# Patient Record
Sex: Male | Born: 1935 | Race: White | Hispanic: No | Marital: Married | Smoking: Former smoker
Health system: Southern US, Community
[De-identification: ages and names within clinical notes are randomized; demographics above are authoritative.]

## PROBLEM LIST (undated history)

## (undated) DIAGNOSIS — Z95828 Presence of other vascular implants and grafts: Secondary | ICD-10-CM

## (undated) DIAGNOSIS — D039 Melanoma in situ, unspecified: Secondary | ICD-10-CM

## (undated) DIAGNOSIS — E119 Type 2 diabetes mellitus without complications: Secondary | ICD-10-CM

## (undated) DIAGNOSIS — B029 Zoster without complications: Secondary | ICD-10-CM

## (undated) DIAGNOSIS — E669 Obesity, unspecified: Secondary | ICD-10-CM

## (undated) DIAGNOSIS — L309 Dermatitis, unspecified: Secondary | ICD-10-CM

## (undated) DIAGNOSIS — C9110 Chronic lymphocytic leukemia of B-cell type not having achieved remission: Secondary | ICD-10-CM

## (undated) DIAGNOSIS — D099 Carcinoma in situ, unspecified: Secondary | ICD-10-CM

## (undated) DIAGNOSIS — Z8701 Personal history of pneumonia (recurrent): Secondary | ICD-10-CM

## (undated) DIAGNOSIS — IMO0002 Reserved for concepts with insufficient information to code with codable children: Secondary | ICD-10-CM

## (undated) DIAGNOSIS — J189 Pneumonia, unspecified organism: Secondary | ICD-10-CM

## (undated) DIAGNOSIS — D649 Anemia, unspecified: Secondary | ICD-10-CM

## (undated) DIAGNOSIS — N189 Chronic kidney disease, unspecified: Secondary | ICD-10-CM

## (undated) DIAGNOSIS — I10 Essential (primary) hypertension: Secondary | ICD-10-CM

## (undated) DIAGNOSIS — E538 Deficiency of other specified B group vitamins: Secondary | ICD-10-CM

## (undated) DIAGNOSIS — Z950 Presence of cardiac pacemaker: Secondary | ICD-10-CM

## (undated) DIAGNOSIS — C9590 Leukemia, unspecified not having achieved remission: Secondary | ICD-10-CM

## (undated) HISTORY — DX: Presence of other vascular implants and grafts: Z95.828

## (undated) HISTORY — DX: Dermatitis, unspecified: L30.9

## (undated) HISTORY — DX: Zoster without complications: B02.9

## (undated) HISTORY — DX: Essential (primary) hypertension: I10

## (undated) HISTORY — PX: EYE SURGERY: SHX253

## (undated) HISTORY — DX: Melanoma in situ, unspecified: D03.9

## (undated) HISTORY — DX: Personal history of pneumonia (recurrent): Z87.01

## (undated) HISTORY — DX: Deficiency of other specified B group vitamins: E53.8

## (undated) HISTORY — DX: Obesity, unspecified: E66.9

## (undated) HISTORY — DX: Reserved for concepts with insufficient information to code with codable children: IMO0002

## (undated) HISTORY — DX: Chronic lymphocytic leukemia of B-cell type not having achieved remission: C91.10

## (undated) HISTORY — DX: Anemia, unspecified: D64.9

## (undated) HISTORY — PX: TONSILLECTOMY: SUR1361

## (undated) HISTORY — PX: OTHER SURGICAL HISTORY: SHX169

## (undated) HISTORY — DX: Type 2 diabetes mellitus without complications: E11.9

---

## 1999-05-15 HISTORY — PX: HERNIA REPAIR: SHX51

## 2005-02-10 ENCOUNTER — Emergency Department (HOSPITAL_COMMUNITY): Admission: EM | Admit: 2005-02-10 | Discharge: 2005-02-11 | Payer: Self-pay | Admitting: Emergency Medicine

## 2005-03-13 ENCOUNTER — Encounter: Admission: RE | Admit: 2005-03-13 | Discharge: 2005-03-13 | Payer: Self-pay | Admitting: Oncology

## 2005-03-13 ENCOUNTER — Ambulatory Visit (HOSPITAL_COMMUNITY): Payer: Self-pay | Admitting: Oncology

## 2005-06-08 ENCOUNTER — Encounter: Admission: RE | Admit: 2005-06-08 | Discharge: 2005-06-08 | Payer: Self-pay | Admitting: Oncology

## 2005-06-08 ENCOUNTER — Encounter (HOSPITAL_COMMUNITY): Admission: RE | Admit: 2005-06-08 | Discharge: 2005-07-08 | Payer: Self-pay | Admitting: Oncology

## 2005-06-08 ENCOUNTER — Ambulatory Visit (HOSPITAL_COMMUNITY): Payer: Self-pay | Admitting: Oncology

## 2005-11-26 ENCOUNTER — Encounter (HOSPITAL_COMMUNITY): Admission: RE | Admit: 2005-11-26 | Discharge: 2005-12-26 | Payer: Self-pay | Admitting: Oncology

## 2005-11-26 ENCOUNTER — Encounter: Admission: RE | Admit: 2005-11-26 | Discharge: 2005-11-26 | Payer: Self-pay | Admitting: Oncology

## 2005-11-26 ENCOUNTER — Ambulatory Visit (HOSPITAL_COMMUNITY): Payer: Self-pay | Admitting: Oncology

## 2006-05-14 HISTORY — PX: ESOPHAGOGASTRODUODENOSCOPY: SHX1529

## 2006-05-14 HISTORY — PX: PORTACATH PLACEMENT: SHX2246

## 2006-05-30 ENCOUNTER — Encounter (HOSPITAL_COMMUNITY): Admission: RE | Admit: 2006-05-30 | Discharge: 2006-06-29 | Payer: Self-pay | Admitting: Oncology

## 2006-05-31 ENCOUNTER — Ambulatory Visit (HOSPITAL_COMMUNITY): Payer: Self-pay | Admitting: Oncology

## 2006-09-17 ENCOUNTER — Ambulatory Visit (HOSPITAL_COMMUNITY): Admission: RE | Admit: 2006-09-17 | Discharge: 2006-09-17 | Payer: Self-pay | Admitting: Orthopaedic Surgery

## 2006-10-12 ENCOUNTER — Inpatient Hospital Stay (HOSPITAL_COMMUNITY): Admission: EM | Admit: 2006-10-12 | Discharge: 2006-10-15 | Payer: Self-pay | Admitting: Emergency Medicine

## 2006-10-13 ENCOUNTER — Ambulatory Visit: Payer: Self-pay | Admitting: Gastroenterology

## 2006-10-14 ENCOUNTER — Encounter: Payer: Self-pay | Admitting: Gastroenterology

## 2006-10-14 ENCOUNTER — Ambulatory Visit: Payer: Self-pay | Admitting: Gastroenterology

## 2007-01-31 ENCOUNTER — Ambulatory Visit (HOSPITAL_COMMUNITY): Payer: Self-pay | Admitting: Oncology

## 2007-01-31 ENCOUNTER — Encounter (HOSPITAL_COMMUNITY): Admission: RE | Admit: 2007-01-31 | Discharge: 2007-02-11 | Payer: Self-pay | Admitting: Oncology

## 2008-02-10 ENCOUNTER — Ambulatory Visit (HOSPITAL_COMMUNITY): Payer: Self-pay | Admitting: Oncology

## 2008-02-10 ENCOUNTER — Encounter (HOSPITAL_COMMUNITY): Admission: RE | Admit: 2008-02-10 | Discharge: 2008-03-11 | Payer: Self-pay | Admitting: Oncology

## 2008-02-18 ENCOUNTER — Emergency Department (HOSPITAL_COMMUNITY): Admission: EM | Admit: 2008-02-18 | Discharge: 2008-02-18 | Payer: Self-pay | Admitting: Internal Medicine

## 2008-04-19 ENCOUNTER — Encounter (HOSPITAL_COMMUNITY): Admission: RE | Admit: 2008-04-19 | Discharge: 2008-05-12 | Payer: Self-pay | Admitting: Oncology

## 2008-04-19 ENCOUNTER — Ambulatory Visit (HOSPITAL_COMMUNITY): Payer: Self-pay | Admitting: Oncology

## 2008-06-09 ENCOUNTER — Encounter (HOSPITAL_COMMUNITY): Admission: RE | Admit: 2008-06-09 | Discharge: 2008-07-09 | Payer: Self-pay | Admitting: Oncology

## 2008-06-09 ENCOUNTER — Ambulatory Visit (HOSPITAL_COMMUNITY): Payer: Self-pay | Admitting: Oncology

## 2008-09-14 ENCOUNTER — Encounter (HOSPITAL_COMMUNITY): Admission: RE | Admit: 2008-09-14 | Discharge: 2008-10-14 | Payer: Self-pay | Admitting: Oncology

## 2008-09-14 ENCOUNTER — Ambulatory Visit (HOSPITAL_COMMUNITY): Payer: Self-pay | Admitting: Oncology

## 2008-10-29 ENCOUNTER — Ambulatory Visit (HOSPITAL_COMMUNITY): Payer: Self-pay | Admitting: Oncology

## 2008-10-29 ENCOUNTER — Encounter (HOSPITAL_COMMUNITY): Admission: RE | Admit: 2008-10-29 | Discharge: 2008-11-28 | Payer: Self-pay | Admitting: Oncology

## 2008-11-05 ENCOUNTER — Ambulatory Visit (HOSPITAL_COMMUNITY): Admission: RE | Admit: 2008-11-05 | Discharge: 2008-11-05 | Payer: Self-pay | Admitting: General Surgery

## 2008-11-29 ENCOUNTER — Encounter (HOSPITAL_COMMUNITY): Admission: RE | Admit: 2008-11-29 | Discharge: 2008-12-29 | Payer: Self-pay | Admitting: Oncology

## 2008-12-20 ENCOUNTER — Ambulatory Visit (HOSPITAL_COMMUNITY): Payer: Self-pay | Admitting: Oncology

## 2009-01-04 ENCOUNTER — Encounter (HOSPITAL_COMMUNITY): Admission: RE | Admit: 2009-01-04 | Discharge: 2009-02-03 | Payer: Self-pay | Admitting: Oncology

## 2009-02-04 ENCOUNTER — Encounter (HOSPITAL_COMMUNITY): Admission: RE | Admit: 2009-02-04 | Discharge: 2009-02-11 | Payer: Self-pay | Admitting: Oncology

## 2009-02-08 ENCOUNTER — Ambulatory Visit (HOSPITAL_COMMUNITY): Payer: Self-pay | Admitting: Oncology

## 2009-02-16 ENCOUNTER — Encounter (HOSPITAL_COMMUNITY): Admission: RE | Admit: 2009-02-16 | Discharge: 2009-03-18 | Payer: Self-pay | Admitting: Oncology

## 2009-03-27 ENCOUNTER — Inpatient Hospital Stay (HOSPITAL_COMMUNITY): Admission: EM | Admit: 2009-03-27 | Discharge: 2009-03-29 | Payer: Self-pay | Admitting: Emergency Medicine

## 2009-04-01 ENCOUNTER — Encounter (HOSPITAL_COMMUNITY): Admission: RE | Admit: 2009-04-01 | Discharge: 2009-05-01 | Payer: Self-pay | Admitting: Oncology

## 2009-04-01 ENCOUNTER — Ambulatory Visit (HOSPITAL_COMMUNITY): Payer: Self-pay | Admitting: Oncology

## 2009-05-02 ENCOUNTER — Inpatient Hospital Stay (HOSPITAL_COMMUNITY): Admission: AD | Admit: 2009-05-02 | Discharge: 2009-05-04 | Payer: Self-pay | Admitting: Family Medicine

## 2009-05-13 ENCOUNTER — Encounter (HOSPITAL_COMMUNITY): Admission: RE | Admit: 2009-05-13 | Discharge: 2009-06-12 | Payer: Self-pay | Admitting: Oncology

## 2009-06-24 ENCOUNTER — Ambulatory Visit (HOSPITAL_COMMUNITY): Payer: Self-pay | Admitting: Oncology

## 2009-06-24 ENCOUNTER — Encounter (HOSPITAL_COMMUNITY): Admission: RE | Admit: 2009-06-24 | Discharge: 2009-07-24 | Payer: Self-pay | Admitting: Oncology

## 2009-07-22 ENCOUNTER — Encounter (HOSPITAL_COMMUNITY): Admission: RE | Admit: 2009-07-22 | Discharge: 2009-08-21 | Payer: Self-pay | Admitting: Oncology

## 2009-09-23 ENCOUNTER — Observation Stay (HOSPITAL_COMMUNITY): Admission: EM | Admit: 2009-09-23 | Discharge: 2009-09-25 | Payer: Self-pay | Admitting: Emergency Medicine

## 2009-10-14 ENCOUNTER — Ambulatory Visit (HOSPITAL_COMMUNITY): Payer: Self-pay | Admitting: Oncology

## 2009-12-05 ENCOUNTER — Ambulatory Visit (HOSPITAL_COMMUNITY): Payer: Self-pay | Admitting: Oncology

## 2010-01-17 ENCOUNTER — Encounter (HOSPITAL_COMMUNITY): Admission: RE | Admit: 2010-01-17 | Discharge: 2010-02-10 | Payer: Self-pay | Admitting: Oncology

## 2010-01-20 ENCOUNTER — Ambulatory Visit (HOSPITAL_COMMUNITY): Payer: Self-pay | Admitting: Oncology

## 2010-04-18 ENCOUNTER — Ambulatory Visit (HOSPITAL_COMMUNITY): Payer: Self-pay | Admitting: Oncology

## 2010-04-18 ENCOUNTER — Encounter (HOSPITAL_COMMUNITY)
Admission: RE | Admit: 2010-04-18 | Discharge: 2010-05-18 | Payer: Self-pay | Source: Home / Self Care | Attending: Oncology | Admitting: Oncology

## 2010-05-30 ENCOUNTER — Encounter (HOSPITAL_COMMUNITY)
Admission: RE | Admit: 2010-05-30 | Discharge: 2010-06-13 | Payer: Self-pay | Source: Home / Self Care | Attending: Oncology | Admitting: Oncology

## 2010-07-11 ENCOUNTER — Other Ambulatory Visit (HOSPITAL_COMMUNITY): Payer: Medicare Other

## 2010-07-11 ENCOUNTER — Encounter (HOSPITAL_COMMUNITY): Payer: Medicare Other | Attending: Oncology

## 2010-07-11 DIAGNOSIS — E119 Type 2 diabetes mellitus without complications: Secondary | ICD-10-CM | POA: Insufficient documentation

## 2010-07-11 DIAGNOSIS — Z9221 Personal history of antineoplastic chemotherapy: Secondary | ICD-10-CM | POA: Insufficient documentation

## 2010-07-11 DIAGNOSIS — C9110 Chronic lymphocytic leukemia of B-cell type not having achieved remission: Secondary | ICD-10-CM | POA: Insufficient documentation

## 2010-07-11 DIAGNOSIS — Z452 Encounter for adjustment and management of vascular access device: Secondary | ICD-10-CM

## 2010-07-24 ENCOUNTER — Ambulatory Visit (HOSPITAL_COMMUNITY): Payer: Medicare Other | Admitting: Oncology

## 2010-07-24 DIAGNOSIS — C9110 Chronic lymphocytic leukemia of B-cell type not having achieved remission: Secondary | ICD-10-CM

## 2010-07-24 LAB — DIFFERENTIAL
Basophils Absolute: 0 10*3/uL (ref 0.0–0.1)
Basophils Relative: 0 % (ref 0–1)
Eosinophils Absolute: 0.2 10*3/uL (ref 0.0–0.7)
Eosinophils Relative: 4 % (ref 0–5)
Lymphocytes Relative: 42 % (ref 12–46)
Lymphs Abs: 1.9 10*3/uL (ref 0.7–4.0)
Monocytes Absolute: 0.6 10*3/uL (ref 0.1–1.0)
Monocytes Relative: 12 % (ref 3–12)
Neutro Abs: 1.8 10*3/uL (ref 1.7–7.7)
Neutrophils Relative %: 41 % — ABNORMAL LOW (ref 43–77)

## 2010-07-24 LAB — COMPREHENSIVE METABOLIC PANEL
ALT: 28 U/L (ref 0–53)
AST: 23 U/L (ref 0–37)
Albumin: 4.2 g/dL (ref 3.5–5.2)
Alkaline Phosphatase: 75 U/L (ref 39–117)
BUN: 15 mg/dL (ref 6–23)
CO2: 25 mEq/L (ref 19–32)
Calcium: 8.7 mg/dL (ref 8.4–10.5)
Chloride: 108 mEq/L (ref 96–112)
Creatinine, Ser: 1.13 mg/dL (ref 0.4–1.5)
GFR calc Af Amer: >60 mL/min (ref >60)
GFR calc non Af Amer: >60 mL/min (ref >60)
Glucose, Bld: 111 mg/dL — ABNORMAL HIGH (ref 70–99)
Potassium: 4.5 mEq/L (ref 3.5–5.1)
Sodium: 141 mEq/L (ref 135–145)
Total Bilirubin: 0.4 mg/dL (ref 0.3–1.2)
Total Protein: 6.2 g/dL (ref 6.0–8.3)

## 2010-07-24 LAB — CBC
HCT: 33.3 % — ABNORMAL LOW (ref 39.0–52.0)
Hemoglobin: 11.7 g/dL — ABNORMAL LOW (ref 13.0–17.0)
MCH: 37 pg — ABNORMAL HIGH (ref 26.0–34.0)
MCHC: 35.1 g/dL (ref 30.0–36.0)
MCV: 105.4 fL — ABNORMAL HIGH (ref 78.0–100.0)
Platelets: 154 10*3/uL (ref 150–400)
RBC: 3.16 MIL/uL — ABNORMAL LOW (ref 4.22–5.81)
RDW: 13.6 % (ref 11.5–15.5)
WBC: 4.5 10*3/uL (ref 4.0–10.5)

## 2010-07-24 LAB — LACTATE DEHYDROGENASE: LDH: 123 U/L (ref 94–250)

## 2010-07-27 LAB — CBC
HCT: 33.8 % — ABNORMAL LOW (ref 39.0–52.0)
Hemoglobin: 11.6 g/dL — ABNORMAL LOW (ref 13.0–17.0)
MCH: 37.5 pg — ABNORMAL HIGH (ref 26.0–34.0)
MCHC: 34.4 g/dL (ref 30.0–36.0)
MCV: 109.2 fL — ABNORMAL HIGH (ref 78.0–100.0)
Platelets: 164 10*3/uL (ref 150–400)
RBC: 3.1 MIL/uL — ABNORMAL LOW (ref 4.22–5.81)
RDW: 16.9 % — ABNORMAL HIGH (ref 11.5–15.5)
WBC: 3.7 10*3/uL — ABNORMAL LOW (ref 4.0–10.5)

## 2010-07-27 LAB — LACTATE DEHYDROGENASE: LDH: 206 U/L (ref 94–250)

## 2010-07-27 LAB — COMPREHENSIVE METABOLIC PANEL
ALT: 30 U/L (ref 0–53)
AST: 29 U/L (ref 0–37)
Albumin: 4 g/dL (ref 3.5–5.2)
Alkaline Phosphatase: 105 U/L (ref 39–117)
BUN: 18 mg/dL (ref 6–23)
CO2: 26 mEq/L (ref 19–32)
Calcium: 8.9 mg/dL (ref 8.4–10.5)
Chloride: 104 mEq/L (ref 96–112)
Creatinine, Ser: 1.05 mg/dL (ref 0.4–1.5)
GFR calc Af Amer: >60 mL/min (ref >60)
GFR calc non Af Amer: >60 mL/min (ref >60)
Glucose, Bld: 245 mg/dL — ABNORMAL HIGH (ref 70–99)
Potassium: 3.8 mEq/L (ref 3.5–5.1)
Sodium: 138 mEq/L (ref 135–145)
Total Bilirubin: 0.6 mg/dL (ref 0.3–1.2)
Total Protein: 6.9 g/dL (ref 6.0–8.3)

## 2010-07-27 LAB — DIFFERENTIAL
Basophils Absolute: 0 10*3/uL (ref 0.0–0.1)
Basophils Relative: 0 % (ref 0–1)
Eosinophils Absolute: 0.2 10*3/uL (ref 0.0–0.7)
Eosinophils Relative: 5 % (ref 0–5)
Lymphocytes Relative: 44 % (ref 12–46)
Lymphs Abs: 1.7 10*3/uL (ref 0.7–4.0)
Monocytes Absolute: 0.5 10*3/uL (ref 0.1–1.0)
Monocytes Relative: 14 % — ABNORMAL HIGH (ref 3–12)
Neutro Abs: 1.3 10*3/uL — ABNORMAL LOW (ref 1.7–7.7)
Neutrophils Relative %: 36 % — ABNORMAL LOW (ref 43–77)

## 2010-08-01 LAB — CARDIAC PANEL(CRET KIN+CKTOT+MB+TROPI)
CK, MB: 1.7 ng/mL (ref 0.3–4.0)
CK, MB: 1.7 ng/mL (ref 0.3–4.0)
Relative Index: 1.2 (ref 0.0–2.5)
Relative Index: 1.4 (ref 0.0–2.5)
Total CK: 146 U/L (ref 7–232)
Troponin I: 0.02 ng/mL (ref 0.00–0.06)

## 2010-08-01 LAB — BASIC METABOLIC PANEL
BUN: 13 mg/dL (ref 6–23)
CO2: 25 mEq/L (ref 19–32)
Calcium: 8.7 mg/dL (ref 8.4–10.5)
Chloride: 102 mEq/L (ref 96–112)
Creatinine, Ser: 1.3 mg/dL (ref 0.4–1.5)
GFR calc Af Amer: >60 mL/min (ref >60)
GFR calc non Af Amer: 54 mL/min — ABNORMAL LOW (ref >60)
Glucose, Bld: 180 mg/dL — ABNORMAL HIGH (ref 70–99)
Potassium: 3.9 mEq/L (ref 3.5–5.1)
Sodium: 136 mEq/L (ref 135–145)

## 2010-08-01 LAB — URINALYSIS, ROUTINE W REFLEX MICROSCOPIC
Bilirubin Urine: NEGATIVE
Glucose, UA: NEGATIVE mg/dL
Ketones, ur: NEGATIVE mg/dL
Leukocytes, UA: NEGATIVE
Nitrite: NEGATIVE
Protein, ur: 30 mg/dL — AB
Specific Gravity, Urine: 1.03 (ref 1.005–1.030)
Urobilinogen, UA: 0.2 mg/dL (ref 0.0–1.0)
pH: 6 (ref 5.0–8.0)

## 2010-08-01 LAB — DIFFERENTIAL
Basophils Absolute: 0 10*3/uL (ref 0.0–0.1)
Basophils Relative: 0 % (ref 0–1)
Eosinophils Absolute: 0.1 10*3/uL (ref 0.0–0.7)
Eosinophils Relative: 3 % (ref 0–5)
Lymphocytes Relative: 27 % (ref 12–46)
Lymphs Abs: 1.2 10*3/uL (ref 0.7–4.0)
Monocytes Absolute: 0.5 10*3/uL (ref 0.1–1.0)
Monocytes Relative: 11 % (ref 3–12)
Neutro Abs: 2.6 10*3/uL (ref 1.7–7.7)
Neutrophils Relative %: 58 % (ref 43–77)

## 2010-08-01 LAB — URINE MICROSCOPIC-ADD ON

## 2010-08-01 LAB — CBC
HCT: 31.6 % — ABNORMAL LOW (ref 39.0–52.0)
Hemoglobin: 11.4 g/dL — ABNORMAL LOW (ref 13.0–17.0)
MCHC: 36.1 g/dL — ABNORMAL HIGH (ref 30.0–36.0)
MCV: 105.2 fL — ABNORMAL HIGH (ref 78.0–100.0)
Platelets: 154 10*3/uL (ref 150–400)
RBC: 3 MIL/uL — ABNORMAL LOW (ref 4.22–5.81)
RDW: 14.6 % (ref 11.5–15.5)
WBC: 4.5 10*3/uL (ref 4.0–10.5)

## 2010-08-01 LAB — POCT CARDIAC MARKERS: Troponin i, poc: <0.05 ng/mL (ref 0.00–0.09)

## 2010-08-02 LAB — DIFFERENTIAL
Basophils Relative: 0 % (ref 0–1)
Eosinophils Absolute: 0.2 10*3/uL (ref 0.0–0.7)
Lymphs Abs: 1.8 10*3/uL (ref 0.7–4.0)
Monocytes Relative: 10 % (ref 3–12)
Neutro Abs: 1.5 10*3/uL — ABNORMAL LOW (ref 1.7–7.7)
Neutrophils Relative %: 40 % — ABNORMAL LOW (ref 43–77)

## 2010-08-02 LAB — CBC
HCT: 25.8 % — ABNORMAL LOW (ref 39.0–52.0)
Hemoglobin: 8.9 g/dL — ABNORMAL LOW (ref 13.0–17.0)
MCHC: 34.6 g/dL (ref 30.0–36.0)
MCV: 107.1 fL — ABNORMAL HIGH (ref 78.0–100.0)
RBC: 2.41 MIL/uL — ABNORMAL LOW (ref 4.22–5.81)
RDW: 29.3 % — ABNORMAL HIGH (ref 11.5–15.5)

## 2010-08-02 LAB — COMPREHENSIVE METABOLIC PANEL
ALT: 52 U/L (ref 0–53)
BUN: 22 mg/dL (ref 6–23)
CO2: 25 mEq/L (ref 19–32)
Calcium: 8.5 mg/dL (ref 8.4–10.5)
GFR calc non Af Amer: 53 mL/min — ABNORMAL LOW (ref >60)
Glucose, Bld: 161 mg/dL — ABNORMAL HIGH (ref 70–99)
Total Protein: 6.5 g/dL (ref 6.0–8.3)

## 2010-08-02 LAB — LACTATE DEHYDROGENASE: LDH: 207 U/L (ref 94–250)

## 2010-08-06 LAB — CBC
HCT: 33.1 % — ABNORMAL LOW (ref 39.0–52.0)
Hemoglobin: 11.8 g/dL — ABNORMAL LOW (ref 13.0–17.0)
MCHC: 35.8 g/dL (ref 30.0–36.0)
MCV: 109.7 fL — ABNORMAL HIGH (ref 78.0–100.0)
MCV: 111.4 fL — ABNORMAL HIGH (ref 78.0–100.0)
Platelets: 186 10*3/uL (ref 150–400)
RBC: 2.87 MIL/uL — ABNORMAL LOW (ref 4.22–5.81)
RBC: 3.01 MIL/uL — ABNORMAL LOW (ref 4.22–5.81)
WBC: 3.4 10*3/uL — ABNORMAL LOW (ref 4.0–10.5)
WBC: 4 10*3/uL (ref 4.0–10.5)

## 2010-08-06 LAB — DIFFERENTIAL
Basophils Relative: 0 % (ref 0–1)
Eosinophils Absolute: 0.2 10*3/uL (ref 0.0–0.7)
Eosinophils Relative: 5 % (ref 0–5)
Lymphocytes Relative: 50 % — ABNORMAL HIGH (ref 12–46)
Lymphs Abs: 1.7 10*3/uL (ref 0.7–4.0)
Lymphs Abs: 2.2 10*3/uL (ref 0.7–4.0)
Monocytes Absolute: 0.4 10*3/uL (ref 0.1–1.0)
Monocytes Relative: 11 % (ref 3–12)
Monocytes Relative: 12 % (ref 3–12)
Neutrophils Relative %: 34 % — ABNORMAL LOW (ref 43–77)

## 2010-08-14 LAB — CROSSMATCH: ABO/RH(D): AB POS

## 2010-08-14 LAB — URINALYSIS, ROUTINE W REFLEX MICROSCOPIC
Bilirubin Urine: NEGATIVE
Glucose, UA: NEGATIVE mg/dL
Hgb urine dipstick: NEGATIVE
Ketones, ur: NEGATIVE mg/dL
Protein, ur: NEGATIVE mg/dL
Urobilinogen, UA: 0.2 mg/dL (ref 0.0–1.0)

## 2010-08-14 LAB — CBC
HCT: 28.4 % — ABNORMAL LOW (ref 39.0–52.0)
MCHC: 36.3 g/dL — ABNORMAL HIGH (ref 30.0–36.0)
MCV: 87.9 fL (ref 78.0–100.0)
Platelets: 111 10*3/uL — ABNORMAL LOW (ref 150–400)
RBC: 1.27 MIL/uL — ABNORMAL LOW (ref 4.22–5.81)
RDW: 16.3 % — ABNORMAL HIGH (ref 11.5–15.5)
WBC: 3.6 10*3/uL — ABNORMAL LOW (ref 4.0–10.5)

## 2010-08-14 LAB — COMPREHENSIVE METABOLIC PANEL
ALT: 44 U/L (ref 0–53)
AST: 36 U/L (ref 0–37)
Albumin: 3.7 g/dL (ref 3.5–5.2)
Albumin: 3.9 g/dL (ref 3.5–5.2)
Alkaline Phosphatase: 76 U/L (ref 39–117)
BUN: 28 mg/dL — ABNORMAL HIGH (ref 6–23)
Chloride: 106 mEq/L (ref 96–112)
Creatinine, Ser: 1.4 mg/dL (ref 0.4–1.5)
GFR calc Af Amer: 43 mL/min — ABNORMAL LOW (ref >60)
GFR calc non Af Amer: 50 mL/min — ABNORMAL LOW (ref >60)
Potassium: 4.4 mEq/L (ref 3.5–5.1)
Sodium: 138 mEq/L (ref 135–145)
Total Bilirubin: 0.8 mg/dL (ref 0.3–1.2)
Total Protein: 6.5 g/dL (ref 6.0–8.3)

## 2010-08-14 LAB — DIFFERENTIAL
Basophils Absolute: 0 10*3/uL (ref 0.0–0.1)
Basophils Relative: 0 % (ref 0–1)
Eosinophils Absolute: 0.2 10*3/uL (ref 0.0–0.7)
Lymphs Abs: 1.8 10*3/uL (ref 0.7–4.0)
Monocytes Absolute: 0.5 10*3/uL (ref 0.1–1.0)
Monocytes Relative: 14 % — ABNORMAL HIGH (ref 3–12)
Neutro Abs: 1.1 10*3/uL — ABNORMAL LOW (ref 1.7–7.7)
Neutro Abs: 2.6 10*3/uL (ref 1.7–7.7)
Neutrophils Relative %: 31 % — ABNORMAL LOW (ref 43–77)
Neutrophils Relative %: 61 % (ref 43–77)

## 2010-08-14 LAB — HEMOGLOBIN AND HEMATOCRIT, BLOOD
HCT: 20.6 % — ABNORMAL LOW (ref 39.0–52.0)
HCT: 27.3 % — ABNORMAL LOW (ref 39.0–52.0)
Hemoglobin: 5.1 g/dL — CL (ref 13.0–17.0)
Hemoglobin: 8.6 g/dL — ABNORMAL LOW (ref 13.0–17.0)

## 2010-08-14 LAB — OCCULT BLOOD (STOOL CUP TO LAB): Fecal Occult Bld: NEGATIVE

## 2010-08-16 LAB — DIFFERENTIAL
Basophils Absolute: 0 10*3/uL (ref 0.0–0.1)
Basophils Absolute: 0 10*3/uL (ref 0.0–0.1)
Basophils Relative: 1 % (ref 0–1)
Basophils Relative: 0 % (ref 0–1)
Eosinophils Absolute: 0.3 10*3/uL (ref 0.0–0.7)
Eosinophils Absolute: 0.1 10*3/uL (ref 0.0–0.7)
Eosinophils Absolute: 0.1 10*3/uL (ref 0.0–0.7)
Lymphocytes Relative: 18 % (ref 12–46)
Lymphs Abs: 1.2 10*3/uL (ref 0.7–4.0)
Lymphs Abs: 2 10*3/uL (ref 0.7–4.0)
Monocytes Relative: 16 % — ABNORMAL HIGH (ref 3–12)
Neutrophils Relative %: 41 % — ABNORMAL LOW (ref 43–77)
Neutrophils Relative %: 52 % (ref 43–77)
Neutrophils Relative %: 65 % (ref 43–77)

## 2010-08-16 LAB — CBC
HCT: 31.5 % — ABNORMAL LOW (ref 39.0–52.0)
Hemoglobin: 11.1 g/dL — ABNORMAL LOW (ref 13.0–17.0)
MCV: 93.2 fL (ref 78.0–100.0)
Platelets: 135 10*3/uL — ABNORMAL LOW (ref 150–400)
Platelets: 187 10*3/uL (ref 150–400)
RBC: 3.5 MIL/uL — ABNORMAL LOW (ref 4.22–5.81)
RDW: 16.6 % — ABNORMAL HIGH (ref 11.5–15.5)
WBC: 6.3 10*3/uL (ref 4.0–10.5)
WBC: 6.5 10*3/uL (ref 4.0–10.5)

## 2010-08-16 LAB — TYPE AND SCREEN: ABO/RH(D): AB POS

## 2010-08-16 LAB — POCT CARDIAC MARKERS: Myoglobin, poc: 164 ng/mL (ref 12–200)

## 2010-08-16 LAB — HEMOCCULT GUIAC POC 1CARD (OFFICE): Fecal Occult Bld: NEGATIVE

## 2010-08-16 LAB — HEMOGLOBIN AND HEMATOCRIT, BLOOD
HCT: 26.4 % — ABNORMAL LOW (ref 39.0–52.0)
Hemoglobin: 9.2 g/dL — ABNORMAL LOW (ref 13.0–17.0)
Hemoglobin: 9.4 g/dL — ABNORMAL LOW (ref 13.0–17.0)

## 2010-08-16 LAB — COMPREHENSIVE METABOLIC PANEL
ALT: 43 U/L (ref 0–53)
Alkaline Phosphatase: 100 U/L (ref 39–117)
CO2: 24 mEq/L (ref 19–32)
GFR calc non Af Amer: >60 mL/min (ref >60)
Glucose, Bld: 197 mg/dL — ABNORMAL HIGH (ref 70–99)
Potassium: 3.7 mEq/L (ref 3.5–5.1)
Sodium: 137 mEq/L (ref 135–145)
Total Bilirubin: 0.8 mg/dL (ref 0.3–1.2)

## 2010-08-16 LAB — BASIC METABOLIC PANEL
BUN: 25 mg/dL — ABNORMAL HIGH (ref 6–23)
Chloride: 106 mEq/L (ref 96–112)
Creatinine, Ser: 1.82 mg/dL — ABNORMAL HIGH (ref 0.4–1.5)

## 2010-08-16 LAB — LACTATE DEHYDROGENASE: LDH: 219 U/L (ref 94–250)

## 2010-08-16 LAB — PREPARE RBC (CROSSMATCH)

## 2010-08-16 LAB — BRAIN NATRIURETIC PEPTIDE: Pro B Natriuretic peptide (BNP): 158 pg/mL — ABNORMAL HIGH (ref 0.0–100.0)

## 2010-08-17 LAB — DIFFERENTIAL
Basophils Absolute: 0 10*3/uL (ref 0.0–0.1)
Basophils Absolute: 0 10*3/uL (ref 0.0–0.1)
Basophils Absolute: 0 10*3/uL (ref 0.0–0.1)
Basophils Relative: 1 % (ref 0–1)
Basophils Relative: 0 % (ref 0–1)
Eosinophils Absolute: 0 10*3/uL (ref 0.0–0.7)
Eosinophils Relative: 0 % (ref 0–5)
Lymphocytes Relative: 32 % (ref 12–46)
Lymphocytes Relative: 61 % — ABNORMAL HIGH (ref 12–46)
Lymphs Abs: 2.6 10*3/uL (ref 0.7–4.0)
Monocytes Relative: 18 % — ABNORMAL HIGH (ref 3–12)
Neutro Abs: 0.7 10*3/uL — ABNORMAL LOW (ref 1.7–7.7)
Neutro Abs: 0.8 10*3/uL — ABNORMAL LOW (ref 1.7–7.7)
Neutro Abs: 4 10*3/uL (ref 1.7–7.7)
Neutrophils Relative %: 26 % — ABNORMAL LOW (ref 43–77)
Neutrophils Relative %: 25 % — ABNORMAL LOW (ref 43–77)
Neutrophils Relative %: 49 % (ref 43–77)
WBC Morphology: INCREASED

## 2010-08-17 LAB — COMPREHENSIVE METABOLIC PANEL
Alkaline Phosphatase: 91 U/L (ref 39–117)
BUN: 20 mg/dL (ref 6–23)
Chloride: 101 mEq/L (ref 96–112)
Creatinine, Ser: 1.52 mg/dL — ABNORMAL HIGH (ref 0.4–1.5)
GFR calc non Af Amer: 45 mL/min — ABNORMAL LOW (ref >60)
Glucose, Bld: 192 mg/dL — ABNORMAL HIGH (ref 70–99)
Potassium: 4.2 mEq/L (ref 3.5–5.1)
Total Bilirubin: 0.7 mg/dL (ref 0.3–1.2)

## 2010-08-17 LAB — CROSSMATCH
ABO/RH(D): AB POS
Antibody Screen: NEGATIVE

## 2010-08-17 LAB — CBC
HCT: 24.7 % — ABNORMAL LOW (ref 39.0–52.0)
Hemoglobin: 8.8 g/dL — ABNORMAL LOW (ref 13.0–17.0)
MCHC: 36.4 g/dL — ABNORMAL HIGH (ref 30.0–36.0)
MCV: 106.3 fL — ABNORMAL HIGH (ref 78.0–100.0)
Platelets: 134 10*3/uL — ABNORMAL LOW (ref 150–400)
Platelets: 67 10*3/uL — ABNORMAL LOW (ref 150–400)
Platelets: 85 10*3/uL — ABNORMAL LOW (ref 150–400)
RBC: 1.88 MIL/uL — ABNORMAL LOW (ref 4.22–5.81)
RDW: 19.4 % — ABNORMAL HIGH (ref 11.5–15.5)
WBC: 3.3 10*3/uL — ABNORMAL LOW (ref 4.0–10.5)
WBC: 8.1 10*3/uL (ref 4.0–10.5)

## 2010-08-17 LAB — URIC ACID: Uric Acid, Serum: 3.9 mg/dL — ABNORMAL LOW (ref 4.0–7.8)

## 2010-08-17 LAB — BETA 2 MICROGLOBULIN, SERUM: Beta-2 Microglobulin: 3.86 mg/L — ABNORMAL HIGH (ref 1.01–1.73)

## 2010-08-17 LAB — LACTATE DEHYDROGENASE: LDH: 175 U/L (ref 94–250)

## 2010-08-18 LAB — COMPREHENSIVE METABOLIC PANEL
ALT: 44 U/L (ref 0–53)
AST: 49 U/L — ABNORMAL HIGH (ref 0–37)
Albumin: 3.7 g/dL (ref 3.5–5.2)
Chloride: 103 mEq/L (ref 96–112)
Creatinine, Ser: 1.34 mg/dL (ref 0.4–1.5)
GFR calc Af Amer: >60 mL/min (ref >60)
Potassium: 3.9 mEq/L (ref 3.5–5.1)
Sodium: 134 mEq/L — ABNORMAL LOW (ref 135–145)
Total Bilirubin: 0.6 mg/dL (ref 0.3–1.2)

## 2010-08-18 LAB — CBC
HCT: 31.4 % — ABNORMAL LOW (ref 39.0–52.0)
MCHC: 35.8 g/dL (ref 30.0–36.0)
MCV: 104.8 fL — ABNORMAL HIGH (ref 78.0–100.0)
MCV: 105.3 fL — ABNORMAL HIGH (ref 78.0–100.0)
Platelets: 88 10*3/uL — ABNORMAL LOW (ref 150–400)
RBC: 2.87 MIL/uL — ABNORMAL LOW (ref 4.22–5.81)
RBC: 2.98 MIL/uL — ABNORMAL LOW (ref 4.22–5.81)
WBC: 3.3 10*3/uL — ABNORMAL LOW (ref 4.0–10.5)
WBC: 3.4 10*3/uL — ABNORMAL LOW (ref 4.0–10.5)

## 2010-08-18 LAB — DIFFERENTIAL
Basophils Absolute: 0 10*3/uL (ref 0.0–0.1)
Eosinophils Absolute: 0 10*3/uL (ref 0.0–0.7)
Eosinophils Absolute: 0.1 10*3/uL (ref 0.0–0.7)
Eosinophils Relative: 1 % (ref 0–5)
Eosinophils Relative: 2 % (ref 0–5)
Lymphocytes Relative: 66 % — ABNORMAL HIGH (ref 12–46)
Lymphs Abs: 2.6 10*3/uL (ref 0.7–4.0)
Monocytes Absolute: 0.5 10*3/uL (ref 0.1–1.0)
Monocytes Relative: 13 % — ABNORMAL HIGH (ref 3–12)

## 2010-08-19 LAB — DIFFERENTIAL
Basophils Absolute: 0 10*3/uL (ref 0.0–0.1)
Lymphocytes Relative: 20 % (ref 12–46)
Monocytes Absolute: 0.8 10*3/uL (ref 0.1–1.0)
Monocytes Relative: 25 % — ABNORMAL HIGH (ref 3–12)
Neutro Abs: 1.3 10*3/uL — ABNORMAL LOW (ref 1.7–7.7)

## 2010-08-19 LAB — COMPREHENSIVE METABOLIC PANEL
Albumin: 3.7 g/dL (ref 3.5–5.2)
BUN: 17 mg/dL (ref 6–23)
Calcium: 9 mg/dL (ref 8.4–10.5)
Creatinine, Ser: 1.55 mg/dL — ABNORMAL HIGH (ref 0.4–1.5)
Total Bilirubin: 0.6 mg/dL (ref 0.3–1.2)
Total Protein: 6.5 g/dL (ref 6.0–8.3)

## 2010-08-19 LAB — CBC
HCT: 33.7 % — ABNORMAL LOW (ref 39.0–52.0)
MCHC: 35.4 g/dL (ref 30.0–36.0)
MCV: 101.1 fL — ABNORMAL HIGH (ref 78.0–100.0)
Platelets: 167 10*3/uL (ref 150–400)
RDW: 16.9 % — ABNORMAL HIGH (ref 11.5–15.5)

## 2010-08-19 LAB — URIC ACID: Uric Acid, Serum: 3.4 mg/dL — ABNORMAL LOW (ref 4.0–7.8)

## 2010-08-20 LAB — CBC
HCT: 31 % — ABNORMAL LOW (ref 39.0–52.0)
Hemoglobin: 12.8 g/dL — ABNORMAL LOW (ref 13.0–17.0)
Platelets: 152 10*3/uL (ref 150–400)
Platelets: 220 10*3/uL (ref 150–400)
RDW: 15.6 % — ABNORMAL HIGH (ref 11.5–15.5)
RDW: 14.7 % (ref 11.5–15.5)
RDW: 15.8 % — ABNORMAL HIGH (ref 11.5–15.5)

## 2010-08-20 LAB — BASIC METABOLIC PANEL
BUN: 25 mg/dL — ABNORMAL HIGH (ref 6–23)
BUN: 27 mg/dL — ABNORMAL HIGH (ref 6–23)
BUN: 45 mg/dL — ABNORMAL HIGH (ref 6–23)
BUN: 53 mg/dL — ABNORMAL HIGH (ref 6–23)
CO2: 22 mEq/L (ref 19–32)
CO2: 23 mEq/L (ref 19–32)
Calcium: 8.6 mg/dL (ref 8.4–10.5)
Calcium: 8.6 mg/dL (ref 8.4–10.5)
Chloride: 106 mEq/L (ref 96–112)
Creatinine, Ser: 1.68 mg/dL — ABNORMAL HIGH (ref 0.4–1.5)
Creatinine, Ser: 2.26 mg/dL — ABNORMAL HIGH (ref 0.4–1.5)
Creatinine, Ser: 1.54 mg/dL — ABNORMAL HIGH (ref 0.4–1.5)
GFR calc non Af Amer: 27 mL/min — ABNORMAL LOW (ref >60)
GFR calc non Af Amer: 45 mL/min — ABNORMAL LOW (ref >60)
Glucose, Bld: 242 mg/dL — ABNORMAL HIGH (ref 70–99)
Glucose, Bld: 215 mg/dL — ABNORMAL HIGH (ref 70–99)
Glucose, Bld: 346 mg/dL — ABNORMAL HIGH (ref 70–99)
Potassium: 5 mEq/L (ref 3.5–5.1)
Sodium: 138 mEq/L (ref 135–145)

## 2010-08-20 LAB — COMPREHENSIVE METABOLIC PANEL
ALT: 29 U/L (ref 0–53)
ALT: 34 U/L (ref 0–53)
AST: 26 U/L (ref 0–37)
Albumin: 4.1 g/dL (ref 3.5–5.2)
Albumin: 3.8 g/dL (ref 3.5–5.2)
Alkaline Phosphatase: 75 U/L (ref 39–117)
Alkaline Phosphatase: 72 U/L (ref 39–117)
BUN: 25 mg/dL — ABNORMAL HIGH (ref 6–23)
Glucose, Bld: 120 mg/dL — ABNORMAL HIGH (ref 70–99)
Potassium: 6.6 mEq/L (ref 3.5–5.1)
Potassium: 5.8 mEq/L — ABNORMAL HIGH (ref 3.5–5.1)
Sodium: 134 mEq/L — ABNORMAL LOW (ref 135–145)
Sodium: 135 mEq/L (ref 135–145)
Total Protein: 7.1 g/dL (ref 6.0–8.3)
Total Protein: 6.7 g/dL (ref 6.0–8.3)

## 2010-08-20 LAB — DIFFERENTIAL
Basophils Absolute: 0 10*3/uL (ref 0.0–0.1)
Basophils Absolute: 0.1 10*3/uL (ref 0.0–0.1)
Basophils Relative: 1 % (ref 0–1)
Eosinophils Absolute: 0.3 10*3/uL (ref 0.0–0.7)
Eosinophils Absolute: 0 10*3/uL (ref 0.0–0.7)
Eosinophils Relative: 5 % (ref 0–5)
Eosinophils Relative: 0 % (ref 0–5)
Lymphocytes Relative: 36 % (ref 12–46)
Lymphocytes Relative: 64 % — ABNORMAL HIGH (ref 12–46)
Monocytes Absolute: 0.8 10*3/uL (ref 0.1–1.0)
Monocytes Relative: 13 % — ABNORMAL HIGH (ref 3–12)
Neutro Abs: 4.4 10*3/uL (ref 1.7–7.7)

## 2010-08-20 LAB — BETA 2 MICROGLOBULIN, SERUM: Beta-2 Microglobulin: 4.74 mg/L — ABNORMAL HIGH (ref 1.01–1.73)

## 2010-08-20 LAB — URIC ACID: Uric Acid, Serum: 6.3 mg/dL (ref 4.0–7.8)

## 2010-08-21 LAB — PROTEIN ELECTROPH W RFLX QUANT IMMUNOGLOBULINS
Beta 2: 4 % (ref 3.2–6.5)
Beta Globulin: 5.9 % (ref 4.7–7.2)
Gamma Globulin: 18.6 % (ref 11.1–18.8)

## 2010-08-21 LAB — DIFFERENTIAL
Basophils Absolute: 0.3 10*3/uL — ABNORMAL HIGH (ref 0.0–0.1)
Basophils Relative: 0 % (ref 0–1)
Basophils Relative: 0 % (ref 0–1)
Eosinophils Absolute: 1.8 10*3/uL — ABNORMAL HIGH (ref 0.0–0.7)
Lymphocytes Relative: 92 % — ABNORMAL HIGH (ref 12–46)
Lymphocytes Relative: 93 % — ABNORMAL HIGH (ref 12–46)
Monocytes Absolute: 1.3 10*3/uL — ABNORMAL HIGH (ref 0.1–1.0)
Monocytes Absolute: 1.8 10*3/uL — ABNORMAL HIGH (ref 0.1–1.0)
Neutro Abs: 11.1 10*3/uL — ABNORMAL HIGH (ref 1.7–7.7)
Neutrophils Relative %: 6 % — ABNORMAL LOW (ref 43–77)
Neutrophils Relative %: 6 % — ABNORMAL LOW (ref 43–77)

## 2010-08-21 LAB — CBC
HCT: 39 % (ref 39.0–52.0)
Hemoglobin: 11.8 g/dL — ABNORMAL LOW (ref 13.0–17.0)
Hemoglobin: 12.5 g/dL — ABNORMAL LOW (ref 13.0–17.0)
MCHC: 32.3 g/dL (ref 30.0–36.0)
MCV: 102.9 fL — ABNORMAL HIGH (ref 78.0–100.0)
Platelets: 221 10*3/uL (ref 150–400)
RBC: 3.61 MIL/uL — ABNORMAL LOW (ref 4.22–5.81)
RDW: 14.6 % (ref 11.5–15.5)
WBC: 188.3 10*3/uL (ref 4.0–10.5)

## 2010-08-21 LAB — BASIC METABOLIC PANEL
Calcium: 9.1 mg/dL (ref 8.4–10.5)
Creatinine, Ser: 1.47 mg/dL (ref 0.4–1.5)
GFR calc Af Amer: 57 mL/min — ABNORMAL LOW (ref >60)
GFR calc non Af Amer: 47 mL/min — ABNORMAL LOW (ref >60)
Glucose, Bld: 116 mg/dL — ABNORMAL HIGH (ref 70–99)
Sodium: 138 mEq/L (ref 135–145)

## 2010-08-21 LAB — IGG, IGA, IGM
IgG (Immunoglobin G), Serum: 1600 mg/dL (ref 694–1618)
IgM, Serum: 41 mg/dL — ABNORMAL LOW (ref 60–263)

## 2010-08-21 LAB — HEPATITIS B SURFACE ANTIGEN: Hepatitis B Surface Ag: NEGATIVE

## 2010-08-21 LAB — IMMUNOFIXATION ELECTROPHORESIS

## 2010-08-21 LAB — COMPREHENSIVE METABOLIC PANEL
Albumin: 4.1 g/dL (ref 3.5–5.2)
BUN: 19 mg/dL (ref 6–23)
Chloride: 105 mEq/L (ref 96–112)
Creatinine, Ser: 1.47 mg/dL (ref 0.4–1.5)
GFR calc non Af Amer: 47 mL/min — ABNORMAL LOW (ref >60)
Glucose, Bld: 111 mg/dL — ABNORMAL HIGH (ref 70–99)
Total Bilirubin: 0.5 mg/dL (ref 0.3–1.2)

## 2010-08-21 LAB — IMMUNOFIXATION ADD-ON

## 2010-08-21 LAB — FERRITIN: Ferritin: 34 ng/mL (ref 22–322)

## 2010-08-21 LAB — HEPATITIS B SURFACE ANTIBODY,QUALITATIVE: Hep B S Ab: NEGATIVE

## 2010-08-22 ENCOUNTER — Encounter (HOSPITAL_COMMUNITY): Payer: Medicare Other

## 2010-08-22 LAB — DIFFERENTIAL
Basophils Absolute: 1.6 10*3/uL — ABNORMAL HIGH (ref 0.0–0.1)
Eosinophils Absolute: 0 10*3/uL (ref 0.0–0.7)
Lymphocytes Relative: 92 % — ABNORMAL HIGH (ref 12–46)
Monocytes Absolute: 1.6 10*3/uL — ABNORMAL HIGH (ref 0.1–1.0)
Neutrophils Relative %: 6 % — ABNORMAL LOW (ref 43–77)

## 2010-08-22 LAB — CBC
Hemoglobin: 12.9 g/dL — ABNORMAL LOW (ref 13.0–17.0)
MCHC: 32 g/dL (ref 30.0–36.0)
Platelets: 241 10*3/uL (ref 150–400)
RDW: 14.4 % (ref 11.5–15.5)

## 2010-08-23 ENCOUNTER — Encounter (HOSPITAL_COMMUNITY): Payer: Medicare Other | Attending: Oncology

## 2010-08-23 DIAGNOSIS — C9110 Chronic lymphocytic leukemia of B-cell type not having achieved remission: Secondary | ICD-10-CM | POA: Insufficient documentation

## 2010-08-23 DIAGNOSIS — E119 Type 2 diabetes mellitus without complications: Secondary | ICD-10-CM | POA: Insufficient documentation

## 2010-08-23 DIAGNOSIS — Z9221 Personal history of antineoplastic chemotherapy: Secondary | ICD-10-CM | POA: Insufficient documentation

## 2010-08-23 DIAGNOSIS — Z452 Encounter for adjustment and management of vascular access device: Secondary | ICD-10-CM

## 2010-08-28 LAB — DIFFERENTIAL
Basophils Relative: 0 % (ref 0–1)
Eosinophils Relative: 1 % (ref 0–5)
Lymphocytes Relative: 79 % — ABNORMAL HIGH (ref 12–46)
Monocytes Absolute: 8.1 10*3/uL — ABNORMAL HIGH (ref 0.1–1.0)
Neutrophils Relative %: 9 % — ABNORMAL LOW (ref 43–77)

## 2010-08-28 LAB — CBC
Hemoglobin: 12.3 g/dL — ABNORMAL LOW (ref 13.0–17.0)
RBC: 3.79 MIL/uL — ABNORMAL LOW (ref 4.22–5.81)
WBC: 73.6 10*3/uL (ref 4.0–10.5)

## 2010-09-26 NOTE — H&P (Signed)
NAME:  DELVECCHIO, MADOLE NO.:  [id number]   MEDICAL RECORD NO.:  [id number]          PATIENT TYPE:  INP   LOCATION:  A201                          FACILITY:  [facility]   PHYSICIAN:  Kingsley Callander. Ouida Sills, MD       DATE OF BIRTH:  1935-06-27   DATE OF ADMISSION:  10/12/2006  DATE OF DISCHARGE:  LH                              HISTORY & PHYSICAL   CHIEF COMPLAINT:  Passed out.   HISTORY OF PRESENT ILLNESS:  This patient is a 75 year old white Reyes  who presented to the emergency room after passing out at a local rodeo.  He had been sitting at the time.  He had reportedly received CPR for  approximately 2 minutes.  He was then aroused and alert, and has had no  difficulty breathing, chest pain or signs of recurrent syncope.  He has  been sore in the chest where he received CPR.  He reports having passed  several dark tarry stools over the last few days.  He states he has a  history of leukemia, followed by Dr. Mariel Sleet.  He has not experienced  angina, diaphoresis, vomiting, bright red rectal bleeding or any prior  bouts of syncope.   PAST MEDICAL HISTORY:  1. Probable chronic lymphocytic anemia, records are not available.  2. Hypertension.  3. Hyperlipidemia.   MEDICATIONS:  He does not know his medications, but states he takes a  pill for his blood pressure and a pill for his cholesterol.   ALLERGIES:  None.   SOCIAL HISTORY:  He does not smoke cigarettes or drink alcohol.  He is  retired from [employer].   REVIEW OF SYSTEMS:  Noncontributory.   PHYSICAL EXAMINATION:  Temperature 98.4, blood pressure 125/64, pulse  80, respirations 20, oxygen saturation 100% on 2 liters.  GENERAL:  Alert, oriented and in no distress.  HEENT: No scleral icterus.  The pharynx reveals three remaining teeth.  NECK:  Supple with no JVD or thyromegaly.  LUNGS:  Clear.  HEART:  Regular with no murmurs.  ABDOMEN:  Nontender.  No hepatosplenomegaly.  CHEST:  Tender over the xiphoid.  RECTAL  EXAM:  Was performed by the emergency room physician, and he was  found to be Hemoccult-positive.  EXTREMITIES:  No cyanosis, clubbing or edema.  NEURO:  Intact.  LYMPH NODES:  No cervical or supraclavicular adenopathy.   LABORATORY DATA:  Chest x-ray is unremarkable.  EKG reveals normal sinus  rhythm.  White count 41.3, hemoglobin 9.3, platelets 267,000.  Sodium  138, potassium 4.5, chloride 109, bicarb 22, BUN 42, creatinine 1.8,  glucose 170, calcium 8.9, albumin 3.3.   IMPRESSION:  1. Syncope.  He will require hospitalization in a monitored setting      for additional evaluation.  His initial point of care markers      reveal a normal troponin of less than 0.05.  2. Gastrointestinal bleed.  He is anemic with a heme-positive stool      and a history of recent melena.  We will treat empirically with IV      Protonix, we will plan a Gastroenterology consult.  3. Probable chronic lymphocytic leukemia.  4. Azotemia.  5. Hyperglycemia.  6. History of hypertension and hyperlipidemia.   We will need to obtain his home medication list.      Kingsley Callander. Ouida Sills, MD  Electronically Signed     ROF/MEDQ  D:  10/12/2006  T:  10/12/2006  Job:  [id number]   cc:   Angus G. Renard Matter, MD  Fax: [phone]

## 2010-09-26 NOTE — Op Note (Signed)
NAME:  Randall Reyes, Randall Reyes NO.:  [id number]   MEDICAL RECORD NO.:  [id number]          PATIENT TYPE:  AMB   LOCATION:  DAY                           FACILITY:  [facility]   PHYSICIAN:  Barbaraann Barthel, M.D. DATE OF BIRTH:  07/11/1935   DATE OF PROCEDURE:  11/05/2008  DATE OF DISCHARGE:                               OPERATIVE REPORT   SURGEON:  Barbaraann Barthel, MD   PREOPERATIVE DIAGNOSIS:  B-cell leukemia.   POSTOPERATIVE DIAGNOSIS:  B-cell leukemia.   PROCEDURE:  Placement of right subclavian Port-A-Cath with fluoroscopy.   WOUND CLASSIFICATION:  Clean.   SPECIMEN:  None.   NOTE:  This is a 75 year old white male who has been followed by the  oncology clinic for B-cell leukemia.  He was referred for Port-A-Cath  placement for treatment of his leukemia with chemotherapy.  We discussed  preoperatively the complications not limited to, but including bleeding,  infection, pneumothorax and catheterization, embolization and informed  consent was obtained.   GROSS OPERATIVE FINDINGS:  The patient had a Port-A-Cath placed via  fluoroscopy via the right subclavian vein without any incident.  Portable chest x-ray is pending.   TECHNIQUE:  The patient was placed in the supine position and after the  adequate administration of sedation, his right hemithorax was prepped  with Betadine solution and draped in the usual manner.  The patient had  to be re-draped because he was a little agitated.  We then after  prepping and draping in the usual manner with the patient in  Trendelenburg, I anesthetized an area below his right clavicle with 1%  Xylocaine without epinephrine and then placed an 18-gauge needle in this  and then a guidewire and then we were able to cannulate the vein without  a problem and the Silastic catheter was then placed over the guidewire  and we removed the dilator and watched under direct fluoroscopy that the  catheter was in good position.  We then tapered  the length appropriately  and then connected to the infusion device and placed this in a  subcutaneous pocket.  Prior to insertion, we cleared the catheter with  any air bubbles and then irrigated with heparinized saline.  The  catheter was placed in good position, then we checked post insertion  with another fluoroscopic view and then I irrigated and then closed the  skin with 4-0 Vicryl and then the skin was closed subcuticularly with 5-  0 Vicryl sutures.  Quarter-inch Steri-Strips, Neosporin and then  OpSite dressing was applied.  Prior to closure, all sponge, needle and  instrument counts found to be correct.  Estimated blood loss was  minimal.  The patient received 500 mL of crystalloids intraoperatively.  There were no complications.  Followup chest x-ray is pending.      Barbaraann Barthel, M.D.  Electronically Signed     WB/MEDQ  D:  11/05/2008  T:  11/05/2008  Job:  [id number]   cc:   Ladona Horns. Mariel Sleet, MD  Fax: [phone]

## 2010-09-26 NOTE — Consult Note (Signed)
NAME:  Randall Reyes, Randall Reyes NO.:  [id number]   MEDICAL RECORD NO.:  [id number]          PATIENT TYPE:  INP   LOCATION:  A201                          FACILITY:  [facility]   PHYSICIAN:  Kassie Mends, M.D.      DATE OF BIRTH:  03-24-1936   DATE OF CONSULTATION:  10/13/2006  DATE OF DISCHARGE:                                 CONSULTATION   REASON FOR CONSULTATION:  Melena.   HISTORY OF PRESENT ILLNESS:  Mr. Glassco is a 75 year old male who has  a history of peptic ulcer disease.  Approximately three years ago, he  had ulcer disease and had an upper endoscopy.  His peptic ulcer disease  was brought on by ibuprofen.  He states he takes aspirin on a daily  basis, as well as nabumetone.  For three to four days, he has seen black-  tarry stools.  He has seen a total of six.  He has been on nabumetone  for two months for arthritis.  He last saw black-tarry stool  approximately one hour ago.  He denies any alcohol use.  He has not had  any heartburn or indigestion, difficulty swallowing, weight loss, or  abdominal pain.  He does have a problem with constipation.  He denies  any bright red blood per rectum.  He was at the rodeo today and he  passed out.  Apparently, he got 2 minutes or CPR.  He denies any chest  pain or shortness of breath currently.   PAST MEDICAL HISTORY:  1. Hypertension.  2. Hyperlipidemia.  3. Arthritis.  4. Probable chronic lymphocytic leukemia.   PAST SURGICAL HISTORY:  Hernia operation.   ALLERGIES:  No known drug allergies.   MEDICATIONS:  1. Lisinopril.  2. Nabumetone.  3. Aspirin.  4. Niaspan.   FAMILY HISTORY:  He denies any family history of colon cancer or colon  polyps.   SOCIAL HISTORY:  He is currently laid off.  He is married.  He states  his youngest child died approximately five months ago.   REVIEW OF SYSTEMS:  He reports having a colonoscopy approximately three  years ago.  His review of systems is per the HPI.  Otherwise,  all  systems are negative.   PHYSICAL EXAMINATION:  VITAL SIGNS:  Temperature 97.3, blood pressure  88/65, pulse 16.  Heart rate 75.  O2 sat 93% on room air.  GENERAL:  He is in no apparent distress.  Alert and oriented x 4.  HEENT:  Atraumatic and normocephalic.  Pupils equal and reactive to  light.  Mouth:  No oral lesions.  Posterior pharynx without erythema or  exudate.  NECK:  Full range of motion.  No lymphadenopathy.  LUNGS:  Clear to auscultation bilaterally.  CARDIOVASCULAR:  Regular rhythm.  No murmur.  Normal S1 and S2.  ABDOMEN:  Bowel sounds are present.  Soft, nontender.  Nondistended.  No  rebound or guarding.  No pulsatile masses or abdominal bruits.  EXTREMITIES:  Without clubbing, cyanosis or edema.  NEUROLOGIC:  He has no focal neurologic deficits.   LABORATORY:  His hemoglobin was 8.1 initially and increased  to 9 after  one unit of packed red blood cells.  Platelets 225.  INR 1.  BUN 30,  creatinine 1.37.  Albumin 3.3.   ASSESSMENT:  Mr. Desena is a 75 year old male with peptic ulcer  disease, on antiinflammatory drugs and GI prophylaxis.  He presented  with melena and syncope.  The differential diagnosis includes peptic  ulcer disease and the low likelihood of gastritis, duodenitis, or  gastric malignancy.   Thank you for allowing me to see Mr. Theurer in consultation.  My  recommendations follow.   RECOMMENDATIONS:  1. Continue proton pump inhibitor.  2. He will be scheduled for an EGD on 06/02.  He should be p.o. after      midnight except for medicine.  3. Begin clear liquid diet.  4. Will transfuse one unit of packed red blood cells now due to his      borderline blood pressure.  5. Should avoid antiinflammatory drugs and antihypertensives at this      time.      Kassie Mends, M.D.  Electronically Signed     SM/MEDQ  D:  10/15/2006  T:  10/15/2006  Job:  [id number]

## 2010-09-26 NOTE — Group Therapy Note (Signed)
NAME:  Randall Reyes, Randall Reyes NO.:  [id number]   MEDICAL RECORD NO.:  [id number]          PATIENT TYPE:  INP   LOCATION:  A201                          FACILITY:  [facility]   PHYSICIAN:  Angus G. Renard Matter, MD   DATE OF BIRTH:  04-08-1936   DATE OF PROCEDURE:  DATE OF DISCHARGE:                                 PROGRESS NOTE   SUBJECTIVE:  The patient was admitted to the hospital following a  syncopal episode.  He had been having several dark stools, does have a  history of leukemia and was admitted with what was thought to be  gastrointestinal bleeding, does have the azotemia, lymphocytic leukemia  and hyperglycemia.   OBJECTIVE:  VITAL SIGNS:  Blood pressure 116/71, respiration 20, pulse  72, temperature 97.2.  Current hemoglobin 10.2, hematocrit 29.4, WBC  34,000.  Cardiac markers within normal range with the exception of  slightly elevated CPK and relative index.  The patient remains alert.  LUNGS:  Clear.  HEART:  Regular rhythm.  ABDOMEN:  No palpable organs or  masses.   ASSESSMENT:  The patient was admitted with the above-stated problems.   PLAN:  Plan to continue to monitor hemoglobin and hematocrit, GI consult  and Hematology consult.      Angus G. Renard Matter, MD  Electronically Signed     AGM/MEDQ  D:  10/14/2006  T:  10/14/2006  Job:  [id number]

## 2010-09-26 NOTE — Op Note (Signed)
NAME:  Randall Reyes, Randall Reyes NO.:  [id number]   MEDICAL RECORD NO.:  [id number]          PATIENT TYPE:  INP   LOCATION:  A201                          FACILITY:  [facility]   PHYSICIAN:  Kassie Mends, M.D.      DATE OF BIRTH:  March 20, 1936   DATE OF PROCEDURE:  10/14/2006  DATE OF DISCHARGE:                                PROCEDURE NOTE   REFERRING PHYSICIAN:  Angus G. Renard Matter, M.D.   PROCEDURE:  Esophagogastroduodenoscopy with cold forceps biopsies.   INDICATION FOR EXAMINATION:  Mr. Halladay is a 75 year old male who  presented with an episode of syncope and melena.   FINDINGS:  1. Normal esophagus without evidence of Barrett's, mass, erosion, or      ulceration.  2. Diffuse erythema of the antrum without erosion or ulceration.      Biopsies obtained to evaluate for H. pylori infection.  No old      blood or fresh blood seen in the stomach.  3. A 1-cm clean-based duodenal ulcer seen at the junction of D1 and      D2.  No old blood or fresh blood seen in the duodenum.   RECOMMENDATIONS:  1. Add daily PPI.  2. Will await biopsies.  Will treat for H. pylori if positive.  3. Avoid aspirin and NSAIDs for the next 30 days.  May restart aspirin      as long as the patient is continued on a proton pump inhibitor.      Would consider the benefits versus the risks of the use of aspirin      in regards to stroke/cardiovascular prevention.  4. Avoid gastric irritants.  Mr. Bracewell family is given a handout      on gastric irritants and he will also be given a handout on gastric      irritants.   MEDICATIONS:  1. Demerol 75 mg IV.  2. Versed 4 mg IV.   PROCEDURE TECHNIQUE:  Physical exam was performed and informed consent  was obtained from the patient after explaining the benefits, risks and  alternatives to the procedure.  The patient was connected to the monitor  and placed in the left lateral position.  Continuous oxygen was provided  by nasal cannula and IV medicine  administered through an indwelling  cannula.  After administration of sedation, the patient's esophagus was  intubated and the  scope was advanced under direct visualization to the second portion of  the duodenum.  The scope was subsequently removed slowly by carefully  examining the color, texture, anatomy, and integrity of the mucosa on  the way out.  The patient was recovered in endoscopy and discharged to  the floor in satisfactory condition.      Kassie Mends, M.D.  Electronically Signed     SM/MEDQ  D:  10/14/2006  T:  10/14/2006  Job:  [id number]   cc:   Angus G. Renard Matter, MD  Fax: [phone]

## 2010-09-26 NOTE — Group Therapy Note (Signed)
NAME:  Randall Reyes, Randall Reyes NO.:  [id number]   MEDICAL RECORD NO.:  [id number]          PATIENT TYPE:  INP   LOCATION:  A201                          FACILITY:  [facility]   PHYSICIAN:  Angus G. Renard Matter, MD   DATE OF BIRTH:  1935/09/19   DATE OF PROCEDURE:  DATE OF DISCHARGE:                                 PROGRESS NOTE   This patient was admitted to the hospital following a syncopal episode.  He had had several dark stools.  He does have a history of leukemia and  was admitted with what was thought to be gastrointestinal bleeding.  He  does have azotemia, lymphocytic leukemia and hyperglycemia.  He did have  EGD done yesterday which shows evidence of duodenal ulcer.   OBJECTIVE:  VITAL SIGNS:  Blood pressure 105/62, respirations 18, pulse  71, temperature 98.3.  Hemoglobin 10.2, hematocrit 29.4, electrolytes  otherwise normal.  LUNGS:  Clear.  HEART:  Regular rhythm.  ABDOMEN:  No palpable organs or masses.   ASSESSMENT:  The patient was admitted following episode of syncope.  He  has been having rectal bleeding.  He does have evidence of duodenal  ulcer.  He has known history of chronic lymphocytic leukemia, azotemia.   PLAN:  To continue current regimen.      Angus G. Renard Matter, MD  Electronically Signed     AGM/MEDQ  D:  10/15/2006  T:  10/15/2006  Job:  [id number]

## 2010-09-29 NOTE — Discharge Summary (Signed)
NAME:  Randall Reyes, Randall Reyes NO.:  [id number]   MEDICAL RECORD NO.:  [id number]          PATIENT TYPE:  INP   LOCATION:  A201                          FACILITY:  [facility]   PHYSICIAN:  Angus G. Renard Matter, MD   DATE OF BIRTH:  1936/01/10   DATE OF ADMISSION:  10/12/2006  DATE OF DISCHARGE:  06/03/2008LH                               DISCHARGE SUMMARY   DIAGNOSES:  1. Duodenal ulcer with hemorrhage.  2. Chronic lymphocytic leukemia, in remission.  3. Hypertension.  4. Hyperlipidemia.  5. Anemia.   CONDITION:  Stable and improved at time of discharge.   This 75 year old white male presented to the ED after passing out at a  local rodeo.  He had been sitting in the time.  He had reportedly  received CPR for approximately 2 minutes.  He was then aroused and  alert, had no difficulty breathing.  No chest pain or signs of recurrent  syncope.  He had been sore over his chest since he received CPR.  He  reports having passed several dark stools over the past few days.  He  does have a history of leukemia, followed by Dr. Mariel Sleet.   PHYSICAL EXAMINATION:  GENERAL:  Alert, oriented male.  VITAL SIGNS:  Blood pressure 125/64, pulse 80, respirations 20.  HEENT:  Eyes:  PERRLA.  The patient has edentia.  NECK:  Supple.  No JVD or thyroid abnormalities.  LUNGS:  Clear.  HEART:  Regular rhythm, no murmurs.  ABDOMEN:  No palpable organs or masses.  No hepatosplenomegaly.  RECTAL:  Exam was performed and found to be Hemoccult positive.  NEUROLOGIC:  No focal deficit.   LABORATORY DATA:  Admission CBC:  WBC 41,300, hemoglobin 8.1, hematocrit  23.5.  Subsequent CBC on October 14, 2006:  WBC 34,000, with hemoglobin  10.2, hematocrit 29.4.  The patient has predominantly lymphocytes on  differential.  Chemistries:  Sodium 138, potassium 4.5, chloride 109,  CO2 22, glucose 170, BUN 42, creatinine 1.81.  Subsequent chemistries on  October 14, 2006:  Sodium 140, potassium 4, chloride 110, CO2 27,  glucose  125, BUN 16, creatinine 1.20.  Liver enzymes:  SGOT 23, SGPT 26,  alkaline phosphatase 63, bilirubin 0.6, lipase 47.  CK 206, CK-MB 11.1,  troponin 0.04.  Subsequent CK 160, CK-MB 5.2, troponin 0.01. Chest x-  ray:  No active cardiopulmonary process.  Electrocardiogram:  Sinus  rhythm, with first-degree AV block.   HOSPITAL COURSE:  The patient at the time of his admission was placed on  a regular diet, normal saline at 75 cc/hour, [facility] standing orders,  Protonix 40 mg q.24 h., cardiac enzymes q.8 h.  The patient was typed  and crossmatched for blood.  He was transfused with 1 unit.  Shortly  after admission, he was seen in consultation by the gastroenterology  service.  The patient had an EGD by Dr. Cira Servant, with findings of normal  esophagus without evidence of Barrett's esophagus, mass, erosion or  ulceration.  Diffuse erythema of the antrum was noted.  The patient had  a 1 cm clean-based duodenal ulcer seen, without evidence of hemorrhage.  Biopsies were obtained.  The patient tolerated procedure in a  satisfactory manner, and progressively improved following this procedure  and during his hospitalization.  He was able to be discharged on the  third hospital day.   The patient was discharged on:  1. Crestor 10 mg daily.  2. Lisinopril 10 mg daily.  3. Niaspan 500 mg daily.  4. Nabumetone 750 mg daily.  5. Protonix 40 mg daily.   The patient was asked to return to the office for followup.      Angus G. Renard Matter, MD  Electronically Signed     AGM/MEDQ  D:  11/05/2006  T:  11/05/2006  Job:  [id number]

## 2010-10-03 ENCOUNTER — Other Ambulatory Visit (HOSPITAL_COMMUNITY): Payer: Medicare Other

## 2010-10-04 ENCOUNTER — Other Ambulatory Visit (HOSPITAL_COMMUNITY): Payer: Self-pay | Admitting: Oncology

## 2010-10-04 ENCOUNTER — Encounter (HOSPITAL_COMMUNITY): Payer: Medicare Other | Attending: Oncology

## 2010-10-04 DIAGNOSIS — Z452 Encounter for adjustment and management of vascular access device: Secondary | ICD-10-CM

## 2010-10-04 DIAGNOSIS — Z9221 Personal history of antineoplastic chemotherapy: Secondary | ICD-10-CM | POA: Insufficient documentation

## 2010-10-04 DIAGNOSIS — C9110 Chronic lymphocytic leukemia of B-cell type not having achieved remission: Secondary | ICD-10-CM | POA: Insufficient documentation

## 2010-10-04 DIAGNOSIS — E119 Type 2 diabetes mellitus without complications: Secondary | ICD-10-CM | POA: Insufficient documentation

## 2010-10-04 DIAGNOSIS — C439 Malignant melanoma of skin, unspecified: Secondary | ICD-10-CM

## 2010-10-04 LAB — COMPREHENSIVE METABOLIC PANEL
ALT: 30 U/L (ref 0–53)
AST: 24 U/L (ref 0–37)
Alkaline Phosphatase: 82 U/L (ref 39–117)
CO2: 25 mEq/L (ref 19–32)
Calcium: 9.9 mg/dL (ref 8.4–10.5)
GFR calc Af Amer: >60 mL/min (ref >60)
Glucose, Bld: 98 mg/dL (ref 70–99)
Potassium: 4.4 mEq/L (ref 3.5–5.1)
Sodium: 139 mEq/L (ref 135–145)
Total Protein: 7 g/dL (ref 6.0–8.3)

## 2010-10-04 LAB — CBC
HCT: 34.5 % — ABNORMAL LOW (ref 39.0–52.0)
Hemoglobin: 11.7 g/dL — ABNORMAL LOW (ref 13.0–17.0)
MCH: 36.3 pg — ABNORMAL HIGH (ref 26.0–34.0)
MCHC: 33.9 g/dL (ref 30.0–36.0)
RDW: 13.5 % (ref 11.5–15.5)

## 2010-10-04 LAB — DIFFERENTIAL
Basophils Absolute: 0 10*3/uL (ref 0.0–0.1)
Basophils Relative: 0 % (ref 0–1)
Eosinophils Absolute: 0.2 10*3/uL (ref 0.0–0.7)
Eosinophils Relative: 5 % (ref 0–5)
Monocytes Absolute: 0.8 10*3/uL (ref 0.1–1.0)
Monocytes Relative: 16 % — ABNORMAL HIGH (ref 3–12)

## 2010-10-04 LAB — LACTATE DEHYDROGENASE: LDH: 193 U/L (ref 94–250)

## 2010-10-23 ENCOUNTER — Encounter (HOSPITAL_COMMUNITY): Payer: Medicare Other | Attending: Oncology | Admitting: Oncology

## 2010-10-23 DIAGNOSIS — E119 Type 2 diabetes mellitus without complications: Secondary | ICD-10-CM | POA: Insufficient documentation

## 2010-10-23 DIAGNOSIS — C9110 Chronic lymphocytic leukemia of B-cell type not having achieved remission: Secondary | ICD-10-CM | POA: Insufficient documentation

## 2010-10-23 DIAGNOSIS — Z9221 Personal history of antineoplastic chemotherapy: Secondary | ICD-10-CM | POA: Insufficient documentation

## 2010-11-01 ENCOUNTER — Ambulatory Visit (HOSPITAL_COMMUNITY)
Admission: RE | Admit: 2010-11-01 | Discharge: 2010-11-01 | Disposition: A | Discharge status: Home / Self Care | Payer: Medicare Other | Source: Ambulatory Visit | Attending: Family Medicine | Admitting: Family Medicine

## 2010-11-01 ENCOUNTER — Other Ambulatory Visit (HOSPITAL_COMMUNITY): Payer: Self-pay | Admitting: Family Medicine

## 2010-11-01 DIAGNOSIS — R05 Cough: Secondary | ICD-10-CM

## 2010-11-01 DIAGNOSIS — R509 Fever, unspecified: Secondary | ICD-10-CM | POA: Insufficient documentation

## 2010-11-01 DIAGNOSIS — R059 Cough, unspecified: Secondary | ICD-10-CM | POA: Insufficient documentation

## 2010-11-03 ENCOUNTER — Emergency Department (HOSPITAL_COMMUNITY): Payer: Medicare Other

## 2010-11-03 ENCOUNTER — Inpatient Hospital Stay (HOSPITAL_COMMUNITY)
Admission: EM | Admit: 2010-11-03 | Discharge: 2010-11-06 | DRG: 312 | Disposition: A | Discharge status: Home / Self Care | Payer: Medicare Other | Attending: Family Medicine | Admitting: Family Medicine

## 2010-11-03 ENCOUNTER — Encounter (HOSPITAL_COMMUNITY): Payer: Self-pay | Admitting: Radiology

## 2010-11-03 DIAGNOSIS — I10 Essential (primary) hypertension: Secondary | ICD-10-CM | POA: Diagnosis present

## 2010-11-03 DIAGNOSIS — E860 Dehydration: Secondary | ICD-10-CM | POA: Diagnosis present

## 2010-11-03 DIAGNOSIS — M79609 Pain in unspecified limb: Secondary | ICD-10-CM | POA: Diagnosis present

## 2010-11-03 DIAGNOSIS — E119 Type 2 diabetes mellitus without complications: Secondary | ICD-10-CM | POA: Diagnosis present

## 2010-11-03 DIAGNOSIS — C9590 Leukemia, unspecified not having achieved remission: Secondary | ICD-10-CM

## 2010-11-03 DIAGNOSIS — N289 Disorder of kidney and ureter, unspecified: Secondary | ICD-10-CM | POA: Diagnosis present

## 2010-11-03 DIAGNOSIS — C9110 Chronic lymphocytic leukemia of B-cell type not having achieved remission: Secondary | ICD-10-CM | POA: Diagnosis present

## 2010-11-03 DIAGNOSIS — I951 Orthostatic hypotension: Principal | ICD-10-CM | POA: Diagnosis present

## 2010-11-03 DIAGNOSIS — B9789 Other viral agents as the cause of diseases classified elsewhere: Secondary | ICD-10-CM | POA: Diagnosis present

## 2010-11-03 DIAGNOSIS — E785 Hyperlipidemia, unspecified: Secondary | ICD-10-CM | POA: Diagnosis present

## 2010-11-03 DIAGNOSIS — J069 Acute upper respiratory infection, unspecified: Secondary | ICD-10-CM | POA: Diagnosis present

## 2010-11-03 DIAGNOSIS — S0003XA Contusion of scalp, initial encounter: Secondary | ICD-10-CM | POA: Diagnosis present

## 2010-11-03 DIAGNOSIS — W010XXA Fall on same level from slipping, tripping and stumbling without subsequent striking against object, initial encounter: Secondary | ICD-10-CM | POA: Diagnosis present

## 2010-11-03 HISTORY — DX: Leukemia, unspecified not having achieved remission: C95.90

## 2010-11-03 LAB — BASIC METABOLIC PANEL
CO2: 22 mEq/L (ref 19–32)
Chloride: 98 mEq/L (ref 96–112)
Creatinine, Ser: 2.03 mg/dL — ABNORMAL HIGH (ref 0.50–1.35)
Glucose, Bld: 133 mg/dL — ABNORMAL HIGH (ref 70–99)

## 2010-11-03 LAB — GLUCOSE, CAPILLARY
Glucose-Capillary: 117 mg/dL — ABNORMAL HIGH (ref 70–99)
Glucose-Capillary: 110 mg/dL — ABNORMAL HIGH (ref 70–99)
Glucose-Capillary: 124 mg/dL — ABNORMAL HIGH (ref 70–99)

## 2010-11-03 LAB — DIFFERENTIAL
Basophils Relative: 2 % — ABNORMAL HIGH (ref 0–1)
Eosinophils Absolute: 0 10*3/uL (ref 0.0–0.7)
Eosinophils Relative: 0 % (ref 0–5)
Lymphs Abs: 0.8 10*3/uL (ref 0.7–4.0)
Monocytes Absolute: 0.3 10*3/uL (ref 0.1–1.0)
Monocytes Relative: 13 % — ABNORMAL HIGH (ref 3–12)
Neutrophils Relative %: 50 % (ref 43–77)

## 2010-11-03 LAB — CBC
MCH: 36.9 pg — ABNORMAL HIGH (ref 26.0–34.0)
MCHC: 35.5 g/dL (ref 30.0–36.0)
MCV: 104 fL — ABNORMAL HIGH (ref 78.0–100.0)
Platelets: 78 10*3/uL — ABNORMAL LOW (ref 150–400)
RBC: 3.28 MIL/uL — ABNORMAL LOW (ref 4.22–5.81)

## 2010-11-03 LAB — URINALYSIS, ROUTINE W REFLEX MICROSCOPIC
Bilirubin Urine: NEGATIVE
Nitrite: NEGATIVE
Urobilinogen, UA: 0.2 mg/dL (ref 0.0–1.0)

## 2010-11-03 LAB — TROPONIN I: Troponin I: <0.3 ng/mL (ref <0.30)

## 2010-11-04 ENCOUNTER — Inpatient Hospital Stay (HOSPITAL_COMMUNITY): Payer: Medicare Other

## 2010-11-04 LAB — GLUCOSE, CAPILLARY: Glucose-Capillary: 140 mg/dL — ABNORMAL HIGH (ref 70–99)

## 2010-11-04 LAB — BASIC METABOLIC PANEL
BUN: 26 mg/dL — ABNORMAL HIGH (ref 6–23)
Calcium: 8.4 mg/dL (ref 8.4–10.5)
GFR calc non Af Amer: 49 mL/min — ABNORMAL LOW (ref >60)
Glucose, Bld: 153 mg/dL — ABNORMAL HIGH (ref 70–99)
Sodium: 131 mEq/L — ABNORMAL LOW (ref 135–145)

## 2010-11-05 LAB — DIFFERENTIAL
Basophils Absolute: 0 10*3/uL (ref 0.0–0.1)
Basophils Relative: 1 % (ref 0–1)
Eosinophils Relative: 0 % (ref 0–5)
Lymphocytes Relative: 55 % — ABNORMAL HIGH (ref 12–46)
Lymphs Abs: 1.4 10*3/uL (ref 0.7–4.0)
Monocytes Relative: 13 % — ABNORMAL HIGH (ref 3–12)
Neutro Abs: 0.8 10*3/uL — ABNORMAL LOW (ref 1.7–7.7)

## 2010-11-05 LAB — BASIC METABOLIC PANEL
BUN: 21 mg/dL (ref 6–23)
CO2: 26 mEq/L (ref 19–32)
Calcium: 8.6 mg/dL (ref 8.4–10.5)
Glucose, Bld: 165 mg/dL — ABNORMAL HIGH (ref 70–99)
Sodium: 134 mEq/L — ABNORMAL LOW (ref 135–145)

## 2010-11-05 LAB — GLUCOSE, CAPILLARY
Glucose-Capillary: 149 mg/dL — ABNORMAL HIGH (ref 70–99)
Glucose-Capillary: 123 mg/dL — ABNORMAL HIGH (ref 70–99)

## 2010-11-05 LAB — CBC
HCT: 28.3 % — ABNORMAL LOW (ref 39.0–52.0)
Hemoglobin: 10.2 g/dL — ABNORMAL LOW (ref 13.0–17.0)
MCH: 37.1 pg — ABNORMAL HIGH (ref 26.0–34.0)
MCV: 102.9 fL — ABNORMAL HIGH (ref 78.0–100.0)
RBC: 2.75 MIL/uL — ABNORMAL LOW (ref 4.22–5.81)

## 2010-11-06 LAB — GLUCOSE, CAPILLARY: Glucose-Capillary: 124 mg/dL — ABNORMAL HIGH (ref 70–99)

## 2010-11-06 LAB — DIFFERENTIAL
Basophils Absolute: 0.1 10*3/uL (ref 0.0–0.1)
Eosinophils Absolute: 0 10*3/uL (ref 0.0–0.7)
Eosinophils Relative: 1 % (ref 0–5)
Lymphocytes Relative: 53 % — ABNORMAL HIGH (ref 12–46)
Neutrophils Relative %: 29 % — ABNORMAL LOW (ref 43–77)

## 2010-11-06 LAB — CBC
Platelets: 63 10*3/uL — ABNORMAL LOW (ref 150–400)
RDW: 13.4 % (ref 11.5–15.5)
WBC: 3.1 10*3/uL — ABNORMAL LOW (ref 4.0–10.5)

## 2010-11-08 NOTE — Discharge Summary (Signed)
Randall Reyes, MCWHERTER NO.:  [id number]  MEDICAL RECORD NO.:  [id number]  LOCATION:  A316                          FACILITY:  [facility]  PHYSICIAN:  Coleston Dirosa G. Renard Matter, MD   DATE OF BIRTH:  [birth year]  DATE OF ADMISSION:  11/03/2010 DATE OF DISCHARGE:  06/25/2012LH                              DISCHARGE SUMMARY   This 75 year old white male was admitted November 03, 2010, discharged November 06, 2010, 3 days' hospitalization.  DIAGNOSES:  Dehydration, near-syncope, chronic lymphocytic leukemia, non- insulin dependent diabetes, leukopenia and recent viral illness.  CONDITION:  Stable and improved at the time of his discharge.  This patient has a prior history of chronic lymphocytic leukemia presented to the emergency department, was seen by emergency room physician after having become lightheaded and dizzy at home and had fallen in the bathroom, but stated he did not lose consciousness. Apparently, fell 2 more times, but had no injury.  A small bump on his right forehead.  He did have blood cultures done as an outpatient which did not show any organism, had a chest x-ray prior to admission which was essentially negative.  The patient has been on Levaquin because of some slight cough and congestion and upper respiratory infection.  PHYSICAL EXAMINATION:  GENERAL:  Alert white male with blood pressure 105/66, respirations 18, pulse 81 and temp 98.5. HEENT:  Eyes, PERRLA.  TMs negative.  Oropharynx benign. NECK:  Supple.  No JVD or thyroid abnormalities. HEART:  Regular rhythm.  No murmurs. LUNGS:  Clear to P and A. ABDOMEN:  No palpable organs or masses.  No organomegaly. EXTREMITIES:  Free of edema.  The patient did have some pain and slight tenderness of both thighs.  LABORATORY DATA:  Admission CBC; WBC 4900 with a hemoglobin of 11.7, hematocrit 34.5, 33 neutrophils, 47 lymphocytes.  Subsequent CBC on November 06, 2010, WBC 3100 with a hemoglobin 10.5, hematocrit 29.0,  53 lymphocytes and 29 neutrophils.  CMP on admission; sodium 139, potassium 4.4, chloride 107, CO2 25, glucose 98, BUN 14, creatinine 0.96.  BMET on November 03, 2010, sodium 132, potassium 4.6, chloride 98, CO2 of 22, BUN 43, creatinine 2.03 and on November 05, 2010, the creatinine was 1.31 and BUN was 21.  CBC done on 25th, WBC 3100 with a hemoglobin 10.5, hematocrit 29.0.  X-RAYS:  Chest x-ray done as an outpatient prior to admission no active lung disease.  CT of the head at the time of his admission showed right frontal scalp hematoma without underlying skull fracture.  Venous Doppler ultrasound of both lower extremities no evidence of bilateral lower extremity deep vein thrombosis.  HOSPITAL COURSE:  The patient at the time of his admission was placed on 2000 calories ADA diet.  Vital signs were monitored q.i.d. with orthostatic blood pressures.  This patient will have normal saline at 100 mL an hour.  DVT prophylaxis with Lovenox 40 mg subcutaneously daily.  Accu-Cheks a.c. and at bedtime.  Neutropenic precautions were started.  He was placed on IV levofloxacin 750 mg daily, metformin 1000 mg daily, was continued on Crestor 20 mg daily, was continued on lisinopril 5 mg daily and ferrous sulfate 325 mg daily.  The patient was  complaining of leg pain.  Initially, a venous Doppler ultrasound was done bilaterally which did not show evidence of deep vein thrombosis. The patient's metformin was held because of elevated BUN and creatinine. His BUN and creatinine gradually returned to normal.  His sugar was controlled with NovoLog sliding scale while metformin was being held. The patient showed progressive improvement to his hospital stay.  It was felt he had a viral illness.  Prior to admission, he had become extremely dehydrated with elevation of BUN and creatinine.  He was neutropenic.  Neutropenic precautions were put in place during his hospitalization.  The patient improved to the point,  we felt that he could be discharged after 3 days' hospitalization.  He was discharged on following medications: 1. Tylenol 325 mg 2 tabs every 4 h. as needed. 2. Crestor 20 mg 1 daily. 3. Ferrous sulfate 325 mg daily. 4. Levofloxacin 250 mg daily. 5. Lisinopril 5 mg daily. 6. Metformin 2 tabs each a.m. and one each p.m.  The patient's condition was stable and improved at the time of his discharge.     Cailey Trigueros G. Renard Matter, MD     AGM/MEDQ  D:  11/06/2010  T:  11/07/2010  Job:  [id number]  Electronically Signed by Butch Penny MD on 11/08/2010 06:31:21 AM

## 2010-11-08 NOTE — Group Therapy Note (Signed)
  NAMERONY, RATZ NO.:  [id number]  MEDICAL RECORD NO.:  [id number]  LOCATION:  A316                          FACILITY:  [facility]  PHYSICIAN:  Apollos Tenbrink G. Renard Matter, MD   DATE OF BIRTH:  [birth year]  DATE OF PROCEDURE:  11/04/2010 DATE OF DISCHARGE:                                PROGRESS NOTE   This patient was admitted with postural hypotension and azotemia.  He was felt to be somewhat dehydrated and does have chronic lymphocytic leukemia.  He had recent febrile illness of undetermined cause.  He did run slight fever through the night.  Relatively asymptomatic today.  The patient did have a fall at home and is complaining of some leg pain.  A repeat BMET showed creatinine of 1.42, down from 2.03 on admission.  OBJECTIVE:  VITAL SIGNS:  Blood pressure 99/62, respirations 18, pulse 89, temperature 98.8. LUNGS:  Clear to P and A. HEART:  Regular rhythm. ABDOMEN:  No palpable organs or masses. EXTREMITIES:  The patient has slight edema in extremities.  ASSESSMENT:  This patient has chronic lymphocytic leukemia and was admitted with postural hypotension, dehydration with elevated creatinine, is leukopenic, and is complain of some leg pain bilaterally.  Plan to do a venous Doppler ultrasound of legs.  Continue with hydration.  Continue to monitor chemistries.     Jakyrie Totherow G. Renard Matter, MD     AGM/MEDQ  D:  11/04/2010  T:  11/04/2010  Job:  [id number]  Electronically Signed by Butch Penny MD on 11/08/2010 06:31:36 AM

## 2010-11-08 NOTE — H&P (Signed)
  Randall Reyes NO.:  [id number]  MEDICAL RECORD NO.:  [id number]  LOCATION:  A316                          FACILITY:  [facility]  PHYSICIAN:  Aengus Sauceda G. Renard Matter, MD   DATE OF BIRTH:  Jun 03, 1935  DATE OF ADMISSION:  11/03/2010 DATE OF DISCHARGE:  LH                             HISTORY & PHYSICAL   This patient is a 75 year old male with a prior history of chronic lymphocytic leukemia who presented to the emergency department and was seen by emergency physician after having become lightheaded and dizzy at home and had a fall in the bathroom, but states that he did not lose consciousness.  Apparently, he fell 2 more times, but had no injury, had a small bump on his right forehead.  Several days prior to this, he was seen in the office with high fever.  He did have blood cultures which did not culture organism, had chest x-ray which was essentially negative.  The patient has been started on p.o. Levaquin because of some slight cough and congestion.  PERTINENT LABORATORY DATA:  CBC 2.1 with a hemoglobin of 12.1, hematocrit 34.1, 50 neutrophils, 36 lymphocytes.  He has had UA essentially negative, CPK-MB 3.8, troponin 0.30.  Chemistries essentially normal exception of creatinine 2.03 and BUN 43, sodium was 132.  The patient did have a head CT which was normal.  Small right frontal scalp hematoma noted with no skull fracture.  The patient was started on intravenous fluids and subsequently was admitted.  SOCIAL HISTORY:  The patient does not smoke or drink alcohol or use drugs.  FAMILY HISTORY:  Positive for hypertension.  PAST MEDICAL HISTORY:  Positive for hypertension, hyperlipidemia, chronic lymphocytic leukemia, previous peptic ulcer and non-insulin- dependent diabetes  SURGICAL HISTORY:  Hernia repair.  ALLERGIES:  No known drug allergies.  MEDICATION LIST: 1. Levofloxacin 750 mg daily. 2. Metformin 1000 mg daily. 3. Crestor 20 mg daily. 4.  Lisinopril 5 mg daily. 5. Ferrous sulfate 325 mg daily.  REVIEW OF SYSTEMS:  HEENT:  Negative.  CARDIOPULMONARY:  The patient has slight cough.  No chest pain.  GI:  No nausea, vomiting or diarrhea. GU:  No dysuria or hematuria.  PHYSICAL EXAMINATION:  GENERAL:  Alert male with blood pressure 105/66, respirations 18, pulse 81 and temp 98.5. HEENT:  Eyes PERRLA.  TMs negative.  Oropharynx benign. NECK:  Supple.  No JVD or thyroid abnormalities. HEART:  Regular rhythm no murmurs. LUNGS:  Clear to P and A. ABDOMEN:  No palpable organs or masses.  No organomegaly. EXTREMITIES:  Free of edema.  The patient does complain of some pain and slight tenderness of both thighs. NEUROLOGIC:  No sensory or motor disturbance.  Cranial nerves intact.  ASSESSMENT:  The patient was admitted with postural hypotension.  He also has azotemia.  He does have a history of chronic lymphocytic leukemia, recent febrile illness of undetermined cause.     Vannessa Godown G. Renard Matter, MD     AGM/MEDQ  D:  11/03/2010  T:  11/04/2010  Job:  [id number]  Electronically Signed by Butch Penny MD on 11/08/2010 06:30:47 AM

## 2010-11-15 NOTE — Group Therapy Note (Signed)
  NAMEJERELLE, VIRDEN NO.:  [id number]  MEDICAL RECORD NO.:  [id number]  LOCATION:  A316                          FACILITY:  [facility]  PHYSICIAN:  Zackary Mckeone L. Juanetta Gosling, M.D.DATE OF BIRTH:  [birth year]  DATE OF PROCEDURE:  11/05/2010 DATE OF DISCHARGE:                                PROGRESS NOTE   Patient of Dr. Renard Matter.  Randall Reyes was admitted with orthostatic hypotension, azotemia, dehydration, chronic lymphocytic leukemia, and low white blood cell count.  He says he feels much better this morning and is markedly improved.  He still shows some orthostasis based on his orthostatic vitals, but he is standing up says he feels better.  His wife says that he seems to be doing better.  His white blood count was 4900 a month ago,  2100 on admission, 2500 now and it is going to be repeated in the morning.  His BMET today shows that his BUN is 21, creatinine 1.31 which is better.  Overall, he is improved.  He still has blood pressure in the 90s, pulse about 90.  Chest is clear.  Heart is regular.  He does not have any edema.  Plan then is to continue with his IV fluids.  Continue with all of his other treatments and follow.  I do not plan to change anything else right now.  I have more labs in the morning.  Overall, I think he is improved.     Timmie Dugue L. Juanetta Gosling, M.D.     ELH/MEDQ  D:  11/05/2010  T:  11/05/2010  Job:  [id number]  Electronically Signed by Kari Baars M.D. on 11/15/2010 09:07:33 AM

## 2010-11-17 ENCOUNTER — Encounter (HOSPITAL_COMMUNITY): Payer: Medicare Other | Attending: Oncology

## 2010-11-17 DIAGNOSIS — E119 Type 2 diabetes mellitus without complications: Secondary | ICD-10-CM | POA: Insufficient documentation

## 2010-11-17 DIAGNOSIS — C9110 Chronic lymphocytic leukemia of B-cell type not having achieved remission: Secondary | ICD-10-CM

## 2010-11-17 DIAGNOSIS — Z452 Encounter for adjustment and management of vascular access device: Secondary | ICD-10-CM

## 2010-11-17 DIAGNOSIS — Z9221 Personal history of antineoplastic chemotherapy: Secondary | ICD-10-CM | POA: Insufficient documentation

## 2010-12-29 ENCOUNTER — Encounter (HOSPITAL_COMMUNITY): Payer: Medicare Other | Attending: Oncology

## 2010-12-29 DIAGNOSIS — C9110 Chronic lymphocytic leukemia of B-cell type not having achieved remission: Secondary | ICD-10-CM | POA: Insufficient documentation

## 2010-12-29 LAB — DIFFERENTIAL
Basophils Absolute: 0 10*3/uL (ref 0.0–0.1)
Eosinophils Relative: 6 % — ABNORMAL HIGH (ref 0–5)
Lymphocytes Relative: 42 % (ref 12–46)
Neutro Abs: 2.6 10*3/uL (ref 1.7–7.7)

## 2010-12-29 LAB — CBC
HCT: 29.8 % — ABNORMAL LOW (ref 39.0–52.0)
Hemoglobin: 10.4 g/dL — ABNORMAL LOW (ref 13.0–17.0)
MCH: 36.9 pg — ABNORMAL HIGH (ref 26.0–34.0)
MCHC: 34.9 g/dL (ref 30.0–36.0)
MCV: 105.7 fL — ABNORMAL HIGH (ref 78.0–100.0)

## 2010-12-29 LAB — COMPREHENSIVE METABOLIC PANEL
Alkaline Phosphatase: 65 U/L (ref 39–117)
BUN: 24 mg/dL — ABNORMAL HIGH (ref 6–23)
Calcium: 9 mg/dL (ref 8.4–10.5)
GFR calc Af Amer: >60 mL/min (ref >60)
Glucose, Bld: 92 mg/dL (ref 70–99)
Total Protein: 6.9 g/dL (ref 6.0–8.3)

## 2010-12-29 LAB — LACTATE DEHYDROGENASE: LDH: 142 U/L (ref 94–250)

## 2010-12-29 MED ORDER — SODIUM CHLORIDE 0.9 % IJ SOLN
10.0000 mL | Freq: Once | INTRAMUSCULAR | Status: AC
  Administered 2010-12-29: 10 mL via INTRAVENOUS

## 2010-12-29 MED ORDER — SODIUM CHLORIDE 0.9 % IJ SOLN
INTRAMUSCULAR | Status: AC
  Administered 2010-12-29: 10 mL via INTRAVENOUS
  Filled 2010-12-29: qty 10

## 2010-12-29 MED ORDER — HEPARIN SOD (PORK) LOCK FLUSH 100 UNIT/ML IV SOLN
INTRAVENOUS | Status: AC
  Administered 2010-12-29: 500 [IU] via INTRAVENOUS
  Filled 2010-12-29: qty 5

## 2010-12-29 MED ORDER — HEPARIN SOD (PORK) LOCK FLUSH 100 UNIT/ML IV SOLN
500.0000 [IU] | Freq: Once | INTRAVENOUS | Status: AC
  Administered 2010-12-29: 500 [IU] via INTRAVENOUS

## 2010-12-29 NOTE — Progress Notes (Signed)
Randall Reyes presented for Portacath access and flush. Proper placement of portacath confirmed by CXR. Portacath located right chest wall accessed with  H 20 needle. No blood return and port flushes easily without pain or discomfort. Portacath flushed with 20ml NS and 500U/33ml Heparin and needle removed intact. Procedure without incident. Patient tolerated procedure well.

## 2011-01-23 ENCOUNTER — Other Ambulatory Visit (HOSPITAL_COMMUNITY): Payer: Medicare Other

## 2011-01-24 ENCOUNTER — Encounter (HOSPITAL_COMMUNITY): Payer: Self-pay | Admitting: Oncology

## 2011-01-24 ENCOUNTER — Encounter (HOSPITAL_COMMUNITY): Payer: Medicare Other | Attending: Oncology | Admitting: Oncology

## 2011-01-24 VITALS — BP 148/73 | HR 56 | Temp 97.5°F | Wt 153.0 lb

## 2011-01-24 DIAGNOSIS — C9110 Chronic lymphocytic leukemia of B-cell type not having achieved remission: Secondary | ICD-10-CM

## 2011-01-24 DIAGNOSIS — E119 Type 2 diabetes mellitus without complications: Secondary | ICD-10-CM

## 2011-01-24 DIAGNOSIS — C439 Malignant melanoma of skin, unspecified: Secondary | ICD-10-CM

## 2011-01-24 DIAGNOSIS — L309 Dermatitis, unspecified: Secondary | ICD-10-CM

## 2011-01-24 DIAGNOSIS — E669 Obesity, unspecified: Secondary | ICD-10-CM | POA: Insufficient documentation

## 2011-01-24 DIAGNOSIS — D039 Melanoma in situ, unspecified: Secondary | ICD-10-CM | POA: Insufficient documentation

## 2011-01-24 HISTORY — DX: Dermatitis, unspecified: L30.9

## 2011-01-24 HISTORY — DX: Melanoma in situ, unspecified: D03.9

## 2011-01-24 HISTORY — DX: Chronic lymphocytic leukemia of B-cell type not having achieved remission: C91.10

## 2011-01-24 HISTORY — DX: Type 2 diabetes mellitus without complications: E11.9

## 2011-01-24 HISTORY — DX: Obesity, unspecified: E66.9

## 2011-01-24 NOTE — Patient Instructions (Signed)
[facility]  Discharge Instructions  RECOMMENDATIONS MADE BY THE CONSULTANT AND ANY TEST RESULTS WILL BE SENT TO YOUR REFERRING DOCTOR.   EXAM FINDINGS BY MD TODAY AND SIGNS AND SYMPTOMS TO REPORT TO CLINIC OR PRIMARY MD: You are doing well  MEDICATIONS PRESCRIBED: Continue same meds  INSTRUCTIONS GIVEN AND DISCUSSED: Other  Port flush every 6 weeks as scheduled  SPECIAL INSTRUCTIONS/FOLLOW-UP: Labs and Dr. Visit  In 3 months I acknowledge that I have been informed and understand all the instructions given to me and received a copy. I do not have any more questions at this time, but understand that I may call the Specialty Clinic at [facility] at [phone] during business hours should I have any further questions or need assistance in obtaining follow-up care.    __________________________________________  _____________  __________ Signature of Patient or Authorized Representative            Date                   Time    __________________________________________ Nurse's Signature

## 2011-01-24 NOTE — Progress Notes (Signed)
[name], MD [address] [place] [address]  1. CLL (chronic lymphocytic leukemia)  CBC, Differential, Basic metabolic panel, Lactate dehydrogenase  2. Melanoma in situ    3. DM (diabetes mellitus)    4. Mild obesity    5. Eczema       INTERVAL HISTORY: Randall Reyes 75 y.o. male returns for  regular  visit for followup of CLL presenting in October 2006 with Stage 0 disease.  The patient denies any B symptoms.  He reports that he does have diarrhea secondary to the metformin medication.  The patient otherwise denies any complaints.  He denies any fevers, chills, night sweats, weight loss, or appetite change.   I personally reviewed and went over laboratory results with the patient.   The patient explains that he was in the hospital for dehydration 2 months ago.  He has recovered nicely.  He blames the dehydration on the excessive atmospheric heat.   Past Medical History  Diagnosis Date  . Diabetes mellitus   . Leukemia 6.22.2012    PER PATIENT  . Hypertension   . Ulcer   . Anemia   . CLL (chronic lymphocytic leukemia) 01/24/2011  . Melanoma in situ 01/24/2011  . DM (diabetes mellitus) 01/24/2011  . Mild obesity 01/24/2011  . Eczema 01/24/2011    has CLL (chronic lymphocytic leukemia); Melanoma in situ; DM (diabetes mellitus); Mild obesity; and Eczema on his problem list.     is allergic to aspirin and ibuprofen.  Randall Reyes does not currently have medications on file.  Past Surgical History  Procedure Date  . Hernia repair 2001  . Bleeding ulcer     Denies any headaches, dizziness, double vision, fevers, chills, night sweats, nausea, vomiting, diarrhea, constipation, chest pain, heart palpitations, shortness of breath, blood in stool, black tarry stool, urinary pain, urinary burning, urinary frequency, hematuria.   PHYSICAL EXAMINATION  ECOG PERFORMANCE STATUS: 0 - Asymptomatic  Filed Vitals:   01/24/11 0924  BP: 148/73  Pulse: 56  Temp:  97.5 F (36.4 C)    GENERAL:alert, no distress, well nourished, well developed, cachectic, comfortable, obese and smiling SKIN: skin color, texture, turgor are normal HEAD: Normocephalic EYES: normal EARS: External ears normal OROPHARYNX:mucous membranes are moist  NECK: supple, no adenopathy, no bruits, thyroid normal size, non-tender, without nodularity, no stridor, non-tender, trachea midline LYMPH:  no palpable lymphadenopathy BREAST:not examined LUNGS: clear to auscultation and percussion HEART: regular rate & rhythm, no murmurs, no gallops, S1 normal and S2 normal ABDOMEN:abdomen soft, non-tender, normal bowel sounds and no hepatosplenomegaly BACK: Back symmetric, no curvature. EXTREMITIES:less then 2 second capillary refill, no joint deformities, effusion, or inflammation, no edema, no skin discoloration, no clubbing, no cyanosis  NEURO: alert & oriented x 3 with fluent speech, no focal motor/sensory deficits, gait normal   LABORATORY DATA: CBC    Component Value Date/Time   WBC 6.1 12/29/2010 1610   RBC 2.82* 12/29/2010 1610   HGB 10.4* 12/29/2010 1610   HCT 29.8* 12/29/2010 1610   PLT 152 12/29/2010 1610   MCV 105.7* 12/29/2010 1610   MCH 36.9* 12/29/2010 1610   MCHC 34.9 12/29/2010 1610   RDW 14.3 12/29/2010 1610   LYMPHSABS 2.6 12/29/2010 1610   MONOABS 0.6 12/29/2010 1610   EOSABS 0.4 12/29/2010 1610   BASOSABS 0.0 12/29/2010 1610      Chemistry      Component Value Date/Time   NA 137 12/29/2010 1610   K 4.5 12/29/2010 1610   CL  105 12/29/2010 1610   CO2 21 12/29/2010 1610   BUN 24* 12/29/2010 1610   CREATININE 1.26 12/29/2010 1610      Component Value Date/Time   CALCIUM 9.0 12/29/2010 1610   ALKPHOS 65 12/29/2010 1610   AST 39* 12/29/2010 1610   ALT 50 12/29/2010 1610   BILITOT 0.3 12/29/2010 1610        ASSESSMENT:  1. CLL, stage 0 2. Mild obesity 3. DM   PLAN:  1. Lab work in 3 months: CBC diff, BMET, LDH 2. Return in 3 months for follow-up 3. I personally  reviewed and went over laboratory results with the patient.   All questions were answered. The patient knows to call the clinic with any problems, questions or concerns. We can certainly see the patient much sooner if necessary.   Arissa Fagin

## 2011-02-09 ENCOUNTER — Encounter (HOSPITAL_BASED_OUTPATIENT_CLINIC_OR_DEPARTMENT_OTHER): Payer: Medicare Other

## 2011-02-09 DIAGNOSIS — C9110 Chronic lymphocytic leukemia of B-cell type not having achieved remission: Secondary | ICD-10-CM

## 2011-02-09 DIAGNOSIS — Z452 Encounter for adjustment and management of vascular access device: Secondary | ICD-10-CM

## 2011-02-09 MED ORDER — HEPARIN SOD (PORK) LOCK FLUSH 100 UNIT/ML IV SOLN
500.0000 [IU] | Freq: Once | INTRAVENOUS | Status: AC
  Administered 2011-02-09: 500 [IU] via INTRAVENOUS
  Filled 2011-02-09: qty 5

## 2011-02-09 MED ORDER — SODIUM CHLORIDE 0.9 % IJ SOLN
10.0000 mL | Freq: Once | INTRAMUSCULAR | Status: AC
  Administered 2011-02-09: 10 mL via INTRAVENOUS
  Filled 2011-02-09: qty 10

## 2011-02-09 MED ORDER — HEPARIN SOD (PORK) LOCK FLUSH 100 UNIT/ML IV SOLN
INTRAVENOUS | Status: AC
  Administered 2011-02-09: 500 [IU] via INTRAVENOUS
  Filled 2011-02-09: qty 5

## 2011-02-09 NOTE — Progress Notes (Signed)
Randall Reyes presented for Portacath access and flush. Proper placement of portacath confirmed by CXR. Portacath located lt chest wall accessed with  H 20 needle. Good blood return present. Portacath flushed with 20ml NS and 500U/27ml Heparin and needle removed intact. Procedure without incident. Patient tolerated procedure well.

## 2011-02-12 LAB — DIFFERENTIAL
Basophils Absolute: 0
Basophils Absolute: 0
Basophils Relative: 0
Eosinophils Absolute: 0.7
Eosinophils Absolute: 0.8 — ABNORMAL HIGH
Eosinophils Absolute: 0.9 — ABNORMAL HIGH
Lymphocytes Relative: 81 — ABNORMAL HIGH
Lymphs Abs: 72.1 — ABNORMAL HIGH
Monocytes Absolute: 12.6 — ABNORMAL HIGH
Monocytes Absolute: 8.5 — ABNORMAL HIGH
Monocytes Relative: 13 — ABNORMAL HIGH
Neutro Abs: 5.4
Neutro Abs: 6.8
Neutrophils Relative %: 7 — ABNORMAL LOW
Neutrophils Relative %: 7 — ABNORMAL LOW

## 2011-02-12 LAB — CBC
HCT: 39.9
MCHC: 32.8
MCHC: 33.2
MCV: 99.6
MCV: 99.5
Platelets: 213
Platelets: 240
RBC: 4.27
RDW: 13.9
RDW: 14.1

## 2011-02-16 LAB — CBC
MCHC: 31.7 g/dL (ref 30.0–36.0)
Platelets: 227 10*3/uL (ref 150–400)
RBC: 4.39 MIL/uL (ref 4.22–5.81)
WBC: 99.1 10*3/uL (ref 4.0–10.5)

## 2011-02-16 LAB — DIFFERENTIAL
Basophils Absolute: 0 10*3/uL (ref 0.0–0.1)
Eosinophils Relative: 1 % (ref 0–5)
Monocytes Absolute: 11.9 10*3/uL — ABNORMAL HIGH (ref 0.1–1.0)
Neutrophils Relative %: 7 % — ABNORMAL LOW (ref 43–77)

## 2011-02-16 LAB — LACTATE DEHYDROGENASE: LDH: 257 U/L — ABNORMAL HIGH (ref 94–250)

## 2011-02-22 LAB — CBC
HCT: 44.7
MCHC: 33.5
MCV: 92
Platelets: 219
RDW: 13.6
WBC: 39.9 — ABNORMAL HIGH

## 2011-02-22 LAB — FERRITIN: Ferritin: 20 — ABNORMAL LOW (ref 22–322)

## 2011-02-22 LAB — DIFFERENTIAL
Band Neutrophils: 0
Blasts: 0
Metamyelocytes Relative: 0
Myelocytes: 0
Promyelocytes Absolute: 0
nRBC: 0

## 2011-03-01 LAB — CBC
HCT: 29.4 — ABNORMAL LOW
Hemoglobin: 10.2 — ABNORMAL LOW
Hemoglobin: 9 — ABNORMAL LOW
MCHC: 34.9
MCV: 92.9
RBC: 2.78 — ABNORMAL LOW
RBC: 3.16 — ABNORMAL LOW
WBC: 34 — ABNORMAL HIGH
WBC: 34.9 — ABNORMAL HIGH

## 2011-03-01 LAB — CARDIAC PANEL(CRET KIN+CKTOT+MB+TROPI)
CK, MB: 5.2 — ABNORMAL HIGH
Total CK: 160

## 2011-03-01 LAB — DIFFERENTIAL
Basophils Absolute: 0
Basophils Absolute: 0.1
Basophils Relative: 0
Eosinophils Absolute: 0
Eosinophils Relative: 0
Lymphocytes Relative: 76 — ABNORMAL HIGH
Lymphs Abs: 26 — ABNORMAL HIGH
Metamyelocytes Relative: 2
Monocytes Absolute: 1.5 — ABNORMAL HIGH
Monocytes Absolute: 2.4 — ABNORMAL HIGH
Monocytes Relative: 4
Monocytes Relative: 7
Myelocytes: 0
Neutro Abs: 6
Neutro Abs: 7
Neutrophils Relative %: 20 — ABNORMAL LOW
nRBC: 0

## 2011-03-01 LAB — BASIC METABOLIC PANEL
CO2: 26
Calcium: 8 — ABNORMAL LOW
Chloride: 111
Creatinine, Ser: 1.37
GFR calc Af Amer: >60
GFR calc Af Amer: >60
GFR calc non Af Amer: >60
Potassium: 4
Sodium: 137
Sodium: 140

## 2011-03-23 ENCOUNTER — Encounter (HOSPITAL_COMMUNITY): Payer: Medicare Other

## 2011-03-26 ENCOUNTER — Encounter (HOSPITAL_COMMUNITY): Payer: Medicare Other | Attending: Oncology

## 2011-03-26 DIAGNOSIS — C9110 Chronic lymphocytic leukemia of B-cell type not having achieved remission: Secondary | ICD-10-CM | POA: Insufficient documentation

## 2011-03-26 DIAGNOSIS — Z452 Encounter for adjustment and management of vascular access device: Secondary | ICD-10-CM

## 2011-03-26 MED ORDER — HEPARIN SOD (PORK) LOCK FLUSH 100 UNIT/ML IV SOLN
INTRAVENOUS | Status: AC
  Administered 2011-03-26: 500 [IU]
  Filled 2011-03-26: qty 5

## 2011-03-26 NOTE — Progress Notes (Signed)
Randall Reyes presented for Portacath access and flush. Proper placement of portacath confirmed by CXR. Portacath located rt chest wall accessed with  H 20 needle. No blood return.  Portacath flushed with 20ml NS and 500U/56ml Heparin and needle removed intact. Procedure without incident. Patient tolerated procedure well.

## 2011-04-20 ENCOUNTER — Encounter (HOSPITAL_COMMUNITY): Payer: Medicare Other | Attending: Oncology

## 2011-04-20 DIAGNOSIS — C9110 Chronic lymphocytic leukemia of B-cell type not having achieved remission: Secondary | ICD-10-CM | POA: Insufficient documentation

## 2011-04-20 LAB — CBC
HCT: 34.9 % — ABNORMAL LOW (ref 39.0–52.0)
Hemoglobin: 12.1 g/dL — ABNORMAL LOW (ref 13.0–17.0)
MCH: 37.3 pg — ABNORMAL HIGH (ref 26.0–34.0)
RBC: 3.24 MIL/uL — ABNORMAL LOW (ref 4.22–5.81)

## 2011-04-20 LAB — DIFFERENTIAL
Eosinophils Absolute: 0.3 10*3/uL (ref 0.0–0.7)
Lymphs Abs: 2.7 10*3/uL (ref 0.7–4.0)
Monocytes Absolute: 0.5 10*3/uL (ref 0.1–1.0)
Monocytes Relative: 10 % (ref 3–12)
Neutrophils Relative %: 34 % — ABNORMAL LOW (ref 43–77)

## 2011-04-20 LAB — BASIC METABOLIC PANEL
BUN: 23 mg/dL (ref 6–23)
Chloride: 108 mEq/L (ref 96–112)
Creatinine, Ser: 1.37 mg/dL — ABNORMAL HIGH (ref 0.50–1.35)
GFR calc non Af Amer: 50 mL/min — ABNORMAL LOW (ref >90)
Glucose, Bld: 124 mg/dL — ABNORMAL HIGH (ref 70–99)
Potassium: 4.4 mEq/L (ref 3.5–5.1)

## 2011-04-20 MED ORDER — HEPARIN SOD (PORK) LOCK FLUSH 100 UNIT/ML IV SOLN
INTRAVENOUS | Status: AC
  Administered 2011-04-20: 500 [IU] via INTRAVENOUS
  Filled 2011-04-20: qty 5

## 2011-04-20 MED ORDER — SODIUM CHLORIDE 0.9 % IJ SOLN
10.0000 mL | INTRAMUSCULAR | Status: DC | PRN
  Administered 2011-04-20: 10 mL via INTRAVENOUS
  Filled 2011-04-20: qty 10

## 2011-04-20 MED ORDER — HEPARIN SOD (PORK) LOCK FLUSH 100 UNIT/ML IV SOLN
500.0000 [IU] | Freq: Once | INTRAVENOUS | Status: AC
  Administered 2011-04-20: 500 [IU] via INTRAVENOUS
  Filled 2011-04-20: qty 5

## 2011-04-20 MED ORDER — SODIUM CHLORIDE 0.9 % IJ SOLN
INTRAMUSCULAR | Status: AC
  Administered 2011-04-20: 10 mL via INTRAVENOUS
  Filled 2011-04-20: qty 10

## 2011-04-20 NOTE — Progress Notes (Signed)
Randall Reyes presented for Portacath access and flush. Proper placement of portacath confirmed by CXR. Portacath located right chest wall accessed with  H 20 needle. No blood return and pt reports this is his baseline. Portacath flushed with 20ml NS and 500U/63ml Heparin and needle removed intact. Procedure without incident. Patient tolerated procedure well.

## 2011-04-23 ENCOUNTER — Other Ambulatory Visit (HOSPITAL_COMMUNITY): Payer: Medicare Other

## 2011-04-24 MED ORDER — DIPHENHYDRAMINE HCL 50 MG/ML IJ SOLN
INTRAMUSCULAR | Status: AC
  Filled 2011-04-24: qty 1

## 2011-04-24 MED ORDER — FAMOTIDINE IN NACL 20-0.9 MG/50ML-% IV SOLN
INTRAVENOUS | Status: AC
  Filled 2011-04-24: qty 50

## 2011-04-25 ENCOUNTER — Encounter (HOSPITAL_BASED_OUTPATIENT_CLINIC_OR_DEPARTMENT_OTHER): Payer: Medicare Other | Admitting: Oncology

## 2011-04-25 VITALS — BP 124/72 | HR 62 | Temp 97.9°F | Ht 61.0 in | Wt 154.0 lb

## 2011-04-25 DIAGNOSIS — E119 Type 2 diabetes mellitus without complications: Secondary | ICD-10-CM

## 2011-04-25 DIAGNOSIS — C9110 Chronic lymphocytic leukemia of B-cell type not having achieved remission: Secondary | ICD-10-CM

## 2011-04-25 DIAGNOSIS — E669 Obesity, unspecified: Secondary | ICD-10-CM

## 2011-04-25 NOTE — Progress Notes (Signed)
Randall Reichert, MD [address] [place] [address]  1. CLL (chronic lymphocytic leukemia)  lisinopril (PRINIVIL,ZESTRIL) 5 MG tablet, CBC, Differential, Comprehensive metabolic panel, Lactate dehydrogenase  2. Mild obesity    3. DM (diabetes mellitus)      INTERVAL HISTORY: Randall Reyes 75 y.o. male returns for  regular  visit for followup of CLL presenting in October 2006 with Stage 0 disease.  The patient denies any complaints today. Reports that he had a large family gathering for Thanksgiving holiday. He plans on having another large family gathering for the Christmas holiday season.  I personally reviewed and went over laboratory results with the patient.  His blood counts remained very stable. His hemoglobin is up 2 g compared to last laboratory work. Hemoglobin is approximately 12. His platelet count is stable her near 150,000. His liver blood cell count is within normal limits.  The patient denies any B. symptoms. He denies any fevers, chills, night sweats, change in appetite, decreased appetite, and weight loss.    Past Medical History  Diagnosis Date  . Diabetes mellitus   . Leukemia 6.22.2012    PER PATIENT  . Hypertension   . Ulcer   . Anemia   . CLL (chronic lymphocytic leukemia) 01/24/2011  . Melanoma in situ 01/24/2011  . DM (diabetes mellitus) 01/24/2011  . Mild obesity 01/24/2011  . Eczema 01/24/2011    has CLL (chronic lymphocytic leukemia); Melanoma in situ; DM (diabetes mellitus); Mild obesity; and Eczema on his problem list.     is allergic to aspirin and ibuprofen.  Randall Reyes had no medications administered during this visit.  Past Surgical History  Procedure Date  . Hernia repair 2001  . Bleeding ulcer     Denies any headaches, dizziness, double vision, fevers, chills, night sweats, nausea, vomiting, diarrhea, constipation, chest pain, heart palpitations, shortness of breath, blood in stool, black tarry stool, urinary pain, urinary  burning, urinary frequency, hematuria.   PHYSICAL EXAMINATION  ECOG PERFORMANCE STATUS: 0 - Asymptomatic  Filed Vitals:   04/25/11 1135  BP: 124/72  Pulse: 62  Temp: 97.9 F (36.6 C)    GENERAL:alert, no distress, well nourished, well developed, comfortable, cooperative, obese and smiling SKIN: skin color, texture, turgor are normal, no rashes or significant lesions HEAD: Normocephalic EYES: normal EARS: External ears normal OROPHARYNX:mucous membranes are moist  NECK: supple, no adenopathy, no bruits, thyroid normal size, non-tender, without nodularity, no stridor, non-tender, trachea midline LYMPH:  no palpable lymphadenopathy, no hepatosplenomegaly BREAST:not examined LUNGS: clear to auscultation and percussion HEART: regular rate & rhythm, no murmurs, no gallops, S1 normal and S2 normal ABDOMEN:abdomen soft, non-tender, obese, normal bowel sounds and no hepatosplenomegaly BACK: Back symmetric, no curvature., No CVA tenderness EXTREMITIES:less then 2 second capillary refill, no joint deformities, effusion, or inflammation, no skin discoloration, no clubbing, no cyanosis, positive findings:  edema trace bilateral lower extremity edema.  NEURO: alert & oriented x 3 with fluent speech, no focal motor/sensory deficits, gait normal   LABORATORY DATA: CBC    Component Value Date/Time   WBC 5.3 04/20/2011 1050   RBC 3.24* 04/20/2011 1050   HGB 12.1* 04/20/2011 1050   HCT 34.9* 04/20/2011 1050   PLT 148* 04/20/2011 1050   MCV 107.7* 04/20/2011 1050   MCH 37.3* 04/20/2011 1050   MCHC 34.7 04/20/2011 1050   RDW 13.6 04/20/2011 1050   LYMPHSABS 2.7 04/20/2011 1050   MONOABS 0.5 04/20/2011 1050   EOSABS 0.3 04/20/2011 1050   BASOSABS 0.0  04/20/2011 1050      Chemistry      Component Value Date/Time   NA 141 04/20/2011 1050   K 4.4 04/20/2011 1050   CL 108 04/20/2011 1050   CO2 24 04/20/2011 1050   BUN 23 04/20/2011 1050   CREATININE 1.37* 04/20/2011 1050      Component Value Date/Time    CALCIUM 8.9 04/20/2011 1050   ALKPHOS 65 12/29/2010 1610   AST 39* 12/29/2010 1610   ALT 50 12/29/2010 1610   BILITOT 0.3 12/29/2010 1610     Results for Randall, Reyes (MRN [id number]) as of 04/25/2011 12:27  Ref. Range 12/29/2010 16:10 04/20/2011 10:50  LD Latest Range: 94-250 U/L 142 150      ASSESSMENT: 1. CLL, stage 0  2. Mild obesity  3. DM     PLAN:  1. Lab work in 3 months: CBC diff, CMET, LDH  2. Return in 3 months for follow-up  3. I personally reviewed and went over laboratory results with the patient.     All questions were answered. The patient knows to call the clinic with any problems, questions or concerns. We can certainly see the patient much sooner if necessary.  The patient and plan discussed with Glenford Peers, MD and he is in agreement with the aforementioned.   Reyes,Randall

## 2011-04-25 NOTE — Patient Instructions (Signed)
[facility]  Discharge Instructions Randall Reyes  [id number] 02/18/1936   RECOMMENDATIONS MADE BY THE CONSULTANT AND ANY TEST RESULTS WILL BE SENT TO YOUR REFERRING DOCTOR.   EXAM FINDINGS BY MD TODAY AND SIGNS AND SYMPTOMS TO REPORT TO CLINIC OR PRIMARY MD: Exam good today We will do lab work in 3 months and then you will see Dr. Mariel Sleet I acknowledge that I have been informed and understand all the instructions given to me and received a copy. I do not have any more questions at this time, but understand that I may call the Specialty Clinic at [facility] at [phone] during business hours should I have any further questions or need assistance in obtaining follow-up care.    __________________________________________  _____________  __________ Signature of Patient or Authorized Representative            Date                   Time    __________________________________________ Nurse's Signature

## 2011-06-01 ENCOUNTER — Encounter (HOSPITAL_COMMUNITY): Payer: Medicare Other | Attending: Oncology

## 2011-06-01 DIAGNOSIS — Z452 Encounter for adjustment and management of vascular access device: Secondary | ICD-10-CM

## 2011-06-01 DIAGNOSIS — C8589 Other specified types of non-Hodgkin lymphoma, extranodal and solid organ sites: Secondary | ICD-10-CM | POA: Insufficient documentation

## 2011-06-01 DIAGNOSIS — C8590 Non-Hodgkin lymphoma, unspecified, unspecified site: Secondary | ICD-10-CM

## 2011-06-01 MED ORDER — HEPARIN SOD (PORK) LOCK FLUSH 100 UNIT/ML IV SOLN
500.0000 [IU] | Freq: Once | INTRAVENOUS | Status: AC
  Administered 2011-06-01: 500 [IU] via INTRAVENOUS
  Filled 2011-06-01: qty 5

## 2011-06-01 MED ORDER — SODIUM CHLORIDE 0.9 % IJ SOLN
INTRAMUSCULAR | Status: AC
  Administered 2011-06-01: 10 mL via INTRAVENOUS
  Filled 2011-06-01: qty 10

## 2011-06-01 MED ORDER — SODIUM CHLORIDE 0.9 % IJ SOLN
10.0000 mL | INTRAMUSCULAR | Status: DC | PRN
  Administered 2011-06-01: 10 mL via INTRAVENOUS
  Filled 2011-06-01: qty 10

## 2011-06-01 MED ORDER — SODIUM CHLORIDE 0.9 % IJ SOLN
INTRAMUSCULAR | Status: AC
  Filled 2011-06-01: qty 10

## 2011-06-01 MED ORDER — HEPARIN SOD (PORK) LOCK FLUSH 100 UNIT/ML IV SOLN
INTRAVENOUS | Status: AC
  Administered 2011-06-01: 500 [IU] via INTRAVENOUS
  Filled 2011-06-01: qty 5

## 2011-06-01 NOTE — Progress Notes (Signed)
Randall Reyes presented for Portacath access and flush. Proper placement of portacath confirmed by CXR. Portacath located rt chest wall accessed with  H 20 needle. No blood return but flushes easily. Portacath flushed with 20ml NS and 500U/76ml Heparin and needle removed intact. Procedure without incident. Patient tolerated procedure well.

## 2011-07-13 ENCOUNTER — Encounter (HOSPITAL_COMMUNITY): Payer: Medicare Other | Attending: Oncology

## 2011-07-13 DIAGNOSIS — Z452 Encounter for adjustment and management of vascular access device: Secondary | ICD-10-CM

## 2011-07-13 DIAGNOSIS — Z9889 Other specified postprocedural states: Secondary | ICD-10-CM | POA: Insufficient documentation

## 2011-07-13 DIAGNOSIS — Z95828 Presence of other vascular implants and grafts: Secondary | ICD-10-CM

## 2011-07-13 DIAGNOSIS — C9110 Chronic lymphocytic leukemia of B-cell type not having achieved remission: Secondary | ICD-10-CM | POA: Insufficient documentation

## 2011-07-13 MED ORDER — HEPARIN SOD (PORK) LOCK FLUSH 100 UNIT/ML IV SOLN
INTRAVENOUS | Status: AC
  Filled 2011-07-13: qty 5

## 2011-07-13 MED ORDER — HEPARIN SOD (PORK) LOCK FLUSH 100 UNIT/ML IV SOLN
500.0000 [IU] | Freq: Once | INTRAVENOUS | Status: DC
  Filled 2011-07-13: qty 5

## 2011-07-13 MED ORDER — SODIUM CHLORIDE 0.9 % IJ SOLN
10.0000 mL | INTRAMUSCULAR | Status: DC | PRN
  Filled 2011-07-13: qty 10

## 2011-07-13 NOTE — Progress Notes (Signed)
Randall Reyes presented for Portacath access and flush. Proper placement of portacath confirmed by CXR. Portacath located rt chest wall accessed with  H 20 needle. No blood return.  Portacath flushed with 20ml NS and 500U/5ml Heparin and needle removed intact. Procedure without incident. Patient tolerated procedure well.   

## 2011-07-24 ENCOUNTER — Encounter (HOSPITAL_BASED_OUTPATIENT_CLINIC_OR_DEPARTMENT_OTHER): Payer: Medicare Other

## 2011-07-24 DIAGNOSIS — C9110 Chronic lymphocytic leukemia of B-cell type not having achieved remission: Secondary | ICD-10-CM

## 2011-07-24 LAB — CBC
HCT: 35.2 % — ABNORMAL LOW (ref 39.0–52.0)
MCHC: 34.1 g/dL (ref 30.0–36.0)
Platelets: 159 10*3/uL (ref 150–400)
RDW: 13.4 % (ref 11.5–15.5)
WBC: 5.7 10*3/uL (ref 4.0–10.5)

## 2011-07-24 LAB — COMPREHENSIVE METABOLIC PANEL
ALT: 25 U/L (ref 0–53)
AST: 20 U/L (ref 0–37)
Albumin: 3.9 g/dL (ref 3.5–5.2)
CO2: 30 mEq/L (ref 19–32)
Calcium: 9.8 mg/dL (ref 8.4–10.5)
Creatinine, Ser: 1.47 mg/dL — ABNORMAL HIGH (ref 0.50–1.35)
Sodium: 142 mEq/L (ref 135–145)

## 2011-07-24 LAB — DIFFERENTIAL
Basophils Absolute: 0 10*3/uL (ref 0.0–0.1)
Basophils Relative: 0 % (ref 0–1)
Eosinophils Relative: 4 % (ref 0–5)
Lymphocytes Relative: 58 % — ABNORMAL HIGH (ref 12–46)
Monocytes Absolute: 0.6 10*3/uL (ref 0.1–1.0)
Neutro Abs: 1.5 10*3/uL — ABNORMAL LOW (ref 1.7–7.7)

## 2011-07-24 NOTE — Progress Notes (Signed)
Labs drawn today for cbc/diff,ldh,cmp 

## 2011-07-27 ENCOUNTER — Encounter (HOSPITAL_COMMUNITY): Payer: Medicare Other

## 2011-07-27 ENCOUNTER — Encounter (HOSPITAL_BASED_OUTPATIENT_CLINIC_OR_DEPARTMENT_OTHER): Payer: Medicare Other | Admitting: Oncology

## 2011-07-27 VITALS — BP 144/84 | HR 64 | Temp 97.6°F | Wt 156.6 lb

## 2011-07-27 DIAGNOSIS — C9110 Chronic lymphocytic leukemia of B-cell type not having achieved remission: Secondary | ICD-10-CM

## 2011-07-27 DIAGNOSIS — E119 Type 2 diabetes mellitus without complications: Secondary | ICD-10-CM

## 2011-07-27 DIAGNOSIS — E7800 Pure hypercholesterolemia, unspecified: Secondary | ICD-10-CM

## 2011-07-27 DIAGNOSIS — N289 Disorder of kidney and ureter, unspecified: Secondary | ICD-10-CM

## 2011-07-27 MED ORDER — HEPARIN SOD (PORK) LOCK FLUSH 100 UNIT/ML IV SOLN
INTRAVENOUS | Status: AC
  Filled 2011-07-27: qty 5

## 2011-07-27 MED ORDER — SODIUM CHLORIDE 0.9 % IJ SOLN
INTRAMUSCULAR | Status: AC
  Filled 2011-07-27: qty 10

## 2011-07-27 MED ORDER — HEPARIN SOD (PORK) LOCK FLUSH 100 UNIT/ML IV SOLN
500.0000 [IU] | Freq: Once | INTRAVENOUS | Status: AC
  Administered 2011-07-27: 500 [IU] via INTRAVENOUS
  Filled 2011-07-27: qty 5

## 2011-07-27 MED ORDER — SODIUM CHLORIDE 0.9 % IJ SOLN
10.0000 mL | INTRAMUSCULAR | Status: DC | PRN
  Administered 2011-07-27: 10 mL via INTRAVENOUS
  Filled 2011-07-27: qty 10

## 2011-07-27 NOTE — Progress Notes (Signed)
Randall Reyes presented for Portacath access and flush. Proper placement of portacath confirmed by CXR. Portacath located right chest wall accessed with  H 20 needle. No blood return and flushes easily without pain or discomfort. Portacath flushed with 20ml NS and 500U/32ml Heparin and needle removed intact. Procedure without incident. Patient tolerated procedure well.

## 2011-07-27 NOTE — Patient Instructions (Signed)
Randall Reyes  [id number] 1935-10-08   [facility]  Discharge Instructions  RECOMMENDATIONS MADE BY THE CONSULTANT AND ANY TEST RESULTS WILL BE SENT TO YOUR REFERRING DOCTOR.   EXAM FINDINGS BY MD TODAY AND SIGNS AND SYMPTOMS TO REPORT TO CLINIC OR PRIMARY MD: You can stop your iron.  No lymph nodes felt by MD on exam.   MEDICATIONS PRESCRIBED: none   INSTRUCTIONS GIVEN AND DISCUSSED: Other :  Report any new lumps, recurrent infections, night sweats, etc.  SPECIAL INSTRUCTIONS/FOLLOW-UP: Lab work Needed in 4 months and Return to Clinic to see PA after labs.   I acknowledge that I have been informed and understand all the instructions given to me and received a copy. I do not have any more questions at this time, but understand that I may call the Specialty Clinic at [facility] at [phone] during business hours should I have any further questions or need assistance in obtaining follow-up care.    __________________________________________  _____________  __________ Signature of Patient or Authorized Representative            Date                   Time    __________________________________________ Nurse's Signature

## 2011-07-27 NOTE — Progress Notes (Signed)
CLL  Kaysan presented with stage 0 disease in October 2006 but by the end of June 2010 his white count risen to 162,500 and eventually as high as 190,000. His beta-2 microglobulin level was up to 8.06 his platelets remain good but he became hypogammaglobulinemic. He had developed diffuse lymphadenopathy and I treated him with fludarabine Cytoxan and Rituxan for 3 cycles before he developed significant pancytopenia which prompted the halt in chemotherapy. His counts have recovered nicely however and he remains asymptomatic today without B symptoms. He is here today for routine followup doing quite well.  Next diagnosis is diabetes mellitus which is fairly well controlled. Next diagnosis is mild renal insufficiency. Next diagnosis is hypercholesterolemia. Diagnosis is hypertension well controlled.  His physical exam shows stability of his vital signs. He remains slightly overweight. He has no palpable adenopathy in the cervical supraclavicular infraclavicular axillary or inguinal areas. He has no hepatosplenomegaly. His lungs are clear to auscultation and percussion. His heart shows a regular rhythm and rate without obvious murmur rub or gallop. He has no arm or leg edema. His Port-A-Cath is intact in the right upper chest wall.  His labs have remained very stable. He is taking iron but I think he can stop that presently.  We will see him back in 4 months this time with blood work and then a physical exam. He will see my physician assistant,[name] and I will see him on the following checkup.

## 2011-08-24 ENCOUNTER — Encounter (HOSPITAL_COMMUNITY): Payer: Medicare Other

## 2011-09-06 ENCOUNTER — Encounter (HOSPITAL_COMMUNITY): Payer: Medicare Other | Attending: Oncology

## 2011-09-06 DIAGNOSIS — C9110 Chronic lymphocytic leukemia of B-cell type not having achieved remission: Secondary | ICD-10-CM

## 2011-09-06 DIAGNOSIS — Z452 Encounter for adjustment and management of vascular access device: Secondary | ICD-10-CM

## 2011-09-06 MED ORDER — HEPARIN SOD (PORK) LOCK FLUSH 100 UNIT/ML IV SOLN
500.0000 [IU] | Freq: Once | INTRAVENOUS | Status: AC
  Administered 2011-09-06: 500 [IU] via INTRAVENOUS
  Filled 2011-09-06: qty 5

## 2011-09-06 MED ORDER — HEPARIN SOD (PORK) LOCK FLUSH 100 UNIT/ML IV SOLN
INTRAVENOUS | Status: AC
  Filled 2011-09-06: qty 5

## 2011-09-06 MED ORDER — SODIUM CHLORIDE 0.9 % IJ SOLN
INTRAMUSCULAR | Status: AC
  Filled 2011-09-06: qty 10

## 2011-09-06 MED ORDER — SODIUM CHLORIDE 0.9 % IJ SOLN
20.0000 mL | INTRAMUSCULAR | Status: DC | PRN
  Administered 2011-09-06: 20 mL via INTRAVENOUS
  Filled 2011-09-06: qty 20

## 2011-09-06 NOTE — Progress Notes (Signed)
Randall Reyes presented for Portacath access and flush. Proper placement of portacath confirmed by CXR. Portacath located right chest wall accessed with  H 20 needle. No blood return and flushes well without any discomfort. Portacath flushed with 20ml NS and 500U/74ml Heparin and needle removed intact. Procedure without incident. Patient tolerated procedure well.

## 2011-10-18 ENCOUNTER — Encounter (HOSPITAL_COMMUNITY): Payer: Medicare Other | Attending: Oncology

## 2011-10-18 DIAGNOSIS — Z452 Encounter for adjustment and management of vascular access device: Secondary | ICD-10-CM

## 2011-10-18 DIAGNOSIS — C9110 Chronic lymphocytic leukemia of B-cell type not having achieved remission: Secondary | ICD-10-CM | POA: Insufficient documentation

## 2011-10-18 MED ORDER — SODIUM CHLORIDE 0.9 % IJ SOLN
20.0000 mL | INTRAMUSCULAR | Status: DC | PRN
  Administered 2011-10-18: 20 mL via INTRAVENOUS
  Filled 2011-10-18: qty 20

## 2011-10-18 MED ORDER — SODIUM CHLORIDE 0.9 % IJ SOLN
INTRAMUSCULAR | Status: AC
  Filled 2011-10-18: qty 10

## 2011-10-18 MED ORDER — HEPARIN SOD (PORK) LOCK FLUSH 100 UNIT/ML IV SOLN
INTRAVENOUS | Status: AC
  Filled 2011-10-18: qty 5

## 2011-10-18 MED ORDER — HEPARIN SOD (PORK) LOCK FLUSH 100 UNIT/ML IV SOLN
500.0000 [IU] | Freq: Once | INTRAVENOUS | Status: AC
  Administered 2011-10-18: 500 [IU] via INTRAVENOUS
  Filled 2011-10-18: qty 5

## 2011-10-18 NOTE — Progress Notes (Signed)
Randall Reyes presented for Portacath access and flush. Proper placement of portacath confirmed by CXR. Portacath located right chest wall accessed with  H 20 needle. No blood return but flushes well. Portacath flushed with 20ml NS and 500U/3ml Heparin and needle removed intact. Procedure without incident. Patient tolerated procedure well.

## 2011-11-19 ENCOUNTER — Encounter (HOSPITAL_COMMUNITY): Payer: Medicare Other | Attending: Oncology

## 2011-11-19 ENCOUNTER — Encounter (HOSPITAL_COMMUNITY): Payer: Medicare Other

## 2011-11-19 DIAGNOSIS — C9110 Chronic lymphocytic leukemia of B-cell type not having achieved remission: Secondary | ICD-10-CM

## 2011-11-19 LAB — DIFFERENTIAL
Lymphs Abs: 3.8 10*3/uL (ref 0.7–4.0)
Monocytes Relative: 9 % (ref 3–12)
Neutro Abs: 1.7 10*3/uL (ref 1.7–7.7)
Neutrophils Relative %: 26 % — ABNORMAL LOW (ref 43–77)

## 2011-11-19 LAB — COMPREHENSIVE METABOLIC PANEL
Alkaline Phosphatase: 69 U/L (ref 39–117)
BUN: 18 mg/dL (ref 6–23)
CO2: 25 mEq/L (ref 19–32)
Chloride: 108 mEq/L (ref 96–112)
Creatinine, Ser: 1.37 mg/dL — ABNORMAL HIGH (ref 0.50–1.35)
GFR calc non Af Amer: 49 mL/min — ABNORMAL LOW (ref >90)
Glucose, Bld: 132 mg/dL — ABNORMAL HIGH (ref 70–99)
Total Bilirubin: 0.2 mg/dL — ABNORMAL LOW (ref 0.3–1.2)

## 2011-11-19 LAB — CBC
Hemoglobin: 11.8 g/dL — ABNORMAL LOW (ref 13.0–17.0)
Platelets: 156 10*3/uL (ref 150–400)
RBC: 3.3 MIL/uL — ABNORMAL LOW (ref 4.22–5.81)
WBC: 6.5 10*3/uL (ref 4.0–10.5)

## 2011-11-19 LAB — LACTATE DEHYDROGENASE: LDH: 152 U/L (ref 94–250)

## 2011-11-19 MED ORDER — SODIUM CHLORIDE 0.9 % IJ SOLN
INTRAMUSCULAR | Status: AC
  Filled 2011-11-19: qty 10

## 2011-11-19 MED ORDER — SODIUM CHLORIDE 0.9 % IJ SOLN
10.0000 mL | Freq: Once | INTRAMUSCULAR | Status: AC
  Administered 2011-11-19: 10 mL via INTRAVENOUS
  Filled 2011-11-19: qty 10

## 2011-11-19 MED ORDER — HEPARIN SOD (PORK) LOCK FLUSH 100 UNIT/ML IV SOLN
INTRAVENOUS | Status: AC
  Filled 2011-11-19: qty 5

## 2011-11-19 MED ORDER — HEPARIN SOD (PORK) LOCK FLUSH 100 UNIT/ML IV SOLN
500.0000 [IU] | Freq: Once | INTRAVENOUS | Status: AC
  Administered 2011-11-19: 500 [IU] via INTRAVENOUS
  Filled 2011-11-19: qty 5

## 2011-11-19 NOTE — Progress Notes (Signed)
Labs drawn today for cbc,diff,cmp,ldh

## 2011-11-19 NOTE — Progress Notes (Signed)
Tolerated port flush well. 

## 2011-11-21 ENCOUNTER — Encounter (HOSPITAL_BASED_OUTPATIENT_CLINIC_OR_DEPARTMENT_OTHER): Payer: Medicare Other | Admitting: Oncology

## 2011-11-21 VITALS — BP 130/70 | HR 49 | Temp 97.6°F | Wt 153.4 lb

## 2011-11-21 DIAGNOSIS — D649 Anemia, unspecified: Secondary | ICD-10-CM

## 2011-11-21 DIAGNOSIS — R599 Enlarged lymph nodes, unspecified: Secondary | ICD-10-CM

## 2011-11-21 DIAGNOSIS — N289 Disorder of kidney and ureter, unspecified: Secondary | ICD-10-CM

## 2011-11-21 DIAGNOSIS — C9110 Chronic lymphocytic leukemia of B-cell type not having achieved remission: Secondary | ICD-10-CM

## 2011-11-21 NOTE — Patient Instructions (Signed)
[facility]  Discharge Instructions Randall Reyes  [id number] January 31, 1936 Dr. Glenford Peers  RECOMMENDATIONS MADE BY THE CONSULTANT AND ANY TEST RESULTS WILL BE SENT TO YOUR REFERRING DOCTOR.   EXAM FINDINGS BY MD TODAY AND SIGNS AND SYMPTOMS TO REPORT TO CLINIC OR PRIMARY MD: Exam good     INSTRUCTIONS GIVEN AND DISCUSSED: Lab work in 4 months Port flush every 6 weeks SPECIAL INSTRUCTIONS/FOLLOW-UP: Return to Clinic in 4 months   I acknowledge that I have been informed and understand all the instructions given to me and received a copy. I do not have any more questions at this time, but understand that I may call the Specialty Clinic at [place] at [phone] during business hours should I have any further questions or need assistance in obtaining follow-up care.    __________________________________________  _____________  __________ Signature of Patient or Authorized Representative            Date                   Time    __________________________________________ Nurse's Signature

## 2011-11-21 NOTE — Progress Notes (Signed)
[name], MD [address] [place] [address]  1. CLL (chronic lymphocytic leukemia)     CURRENT THERAPY: Observation  INTERVAL HISTORY: Randall Reyes 76 y.o. male returns for  regular  visit for followup of CLL presenting in October 2006 with Stage 0 disease.  Kennis presented with stage 0 disease in October 2006 but by the end of June 2010 his white count risen to 162,500 and eventually as high as 190,000. His beta-2 microglobulin level was up to 8.06 his platelets remain good but he became hypogammaglobulinemic. He had developed diffuse lymphadenopathy and I treated him with fludarabine Cytoxan and Rituxan for 3 cycles before he developed significant pancytopenia which prompted the halt in chemotherapy.  His counts have recovered nicely however and he remains asymptomatic.  Randall Reyes is here for followup today. We spent some time discussing his laboratory work.  I personally reviewed and went over laboratory results with the patient.  He continues to have a little bit of renal insufficiency with a creatinine of 1.37. His electrolytes are within normal limits. Heme retains a mild anemia with a hemoglobin of 11.8, within normal limits white blood cell count, and a normal platelet count 156,000.  Randall Reyes is here by himself today. He explains that his wife and her friend every month go on shopping traps and today is day for her shopping trip. Randall Reyes reports that anticipates mowing the grass today which takes approximately 2-1/2 hours. He will likely do this this evening when the weather cools down a little bit.  Otherwise, Randall Reyes denies any complaints. He denies any B. symptoms including fevers, chills, night sweats, loss of weight unintentionally, decreased appetite. He is doing very well. So we will perform laboratory work in 4 months time and have him return in 4 months time for followup.   Past Medical History  Diagnosis Date  . Diabetes mellitus   . Leukemia 6.22.2012    PER PATIENT  .  Hypertension   . Ulcer   . Anemia   . CLL (chronic lymphocytic leukemia) 01/24/2011  . Melanoma in situ 01/24/2011  . DM (diabetes mellitus) 01/24/2011  . Mild obesity 01/24/2011  . Eczema 01/24/2011    has CLL (chronic lymphocytic leukemia); Melanoma in situ; DM (diabetes mellitus); Mild obesity; and Eczema on his problem list.     is allergic to aspirin and ibuprofen.  Mr. Grantham had no medications administered during this visit.  Past Surgical History  Procedure Date  . Hernia repair 2001  . Bleeding ulcer     Denies any headaches, dizziness, double vision, fevers, chills, night sweats, nausea, vomiting, diarrhea, constipation, chest pain, heart palpitations, shortness of breath, blood in stool, black tarry stool, urinary pain, urinary burning, urinary frequency, hematuria.   PHYSICAL EXAMINATION  ECOG PERFORMANCE STATUS: 0 - Asymptomatic  Filed Vitals:   11/21/11 0952  BP: 130/70  Pulse: 49  Temp: 97.6 F (36.4 C)    GENERAL:alert, no distress, well nourished, well developed, comfortable, cooperative, obese and smiling SKIN: skin color, texture, turgor are normal, no rashes or significant lesions HEAD: Normocephalic, No masses, lesions, tenderness or abnormalities EYES: normal, Conjunctiva are pink and non-injected EARS: External ears normal OROPHARYNX:lips, buccal mucosa, and tongue normal and mucous membranes are moist  NECK: supple, no adenopathy, no bruits, thyroid normal size, non-tender, without nodularity, no stridor, non-tender, trachea midline LYMPH:  no palpable lymphadenopathy, no hepatosplenomegaly BREAST:not examined LUNGS: clear to auscultation and percussion HEART: regular rate & rhythm, no murmurs, no gallops, S1 normal  and S2 normal ABDOMEN:abdomen soft, non-tender, obese, normal bowel sounds, no masses or organomegaly and no hepatosplenomegaly BACK: Back symmetric, no curvature., No CVA tenderness EXTREMITIES:less then 2 second capillary refill, no  joint deformities, effusion, or inflammation, no skin discoloration, no clubbing, no cyanosis, positive findings:  edema B/L LE nonpitting edema.  NEURO: alert & oriented x 3 with fluent speech, no focal motor/sensory deficits, gait normal    LABORATORY DATA: Results for Randall Reyes, Randall Reyes (MRN [id number]) as of 11/21/2011 10:37  Ref. Range 11/19/2011 08:59  Sodium Latest Range: 135-145 mEq/L 140  Potassium Latest Range: 3.5-5.1 mEq/L 4.6  Chloride Latest Range: 96-112 mEq/L 108  CO2 Latest Range: 19-32 mEq/L 25  BUN Latest Range: 6-23 mg/dL 18  Creat Latest Range: 0.50-1.35 mg/dL 4.09 (H)  Calcium Latest Range: 8.4-10.5 mg/dL 9.0  GFR calc non Af Amer Latest Range: >90 mL/min 49 (L)  GFR calc Af Amer Latest Range: >90 mL/min 57 (L)  Glucose Latest Range: 70-99 mg/dL 811 (H)  Alkaline Phosphatase Latest Range: 39-117 U/L 69  Albumin Latest Range: 3.5-5.2 g/dL 3.7  AST Latest Range: 0-37 U/L 34  ALT Latest Range: 0-53 U/L 38  Total Protein Latest Range: 6.0-8.3 g/dL 7.3  Total Bilirubin Latest Range: 0.3-1.2 mg/dL 0.2 (L)  LDH Latest Range: 94-250 U/L 152  WBC Latest Range: 4.0-10.5 K/uL 6.5  RBC Latest Range: 4.22-5.81 MIL/uL 3.30 (L)  Hemoglobin Latest Range: 13.0-17.0 g/dL 91.4 (L)  HCT Latest Range: 39.0-52.0 % 34.8 (L)  MCV Latest Range: 78.0-100.0 fL 105.5 (H)  MCH Latest Range: 26.0-34.0 pg 35.8 (H)  MCHC Latest Range: 30.0-36.0 g/dL 78.2  RDW Latest Range: 11.5-15.5 % 13.4  Platelets Latest Range: 150-400 K/uL 156  Neutrophils Relative Latest Range: 43-77 % 26 (L)  Lymphocytes Relative Latest Range: 12-46 % 58 (H)  Monocytes Relative Latest Range: 3-12 % 9  Eosinophils Relative Latest Range: 0-5 % 7 (H)  Basophils Relative Latest Range: 0-1 % 0  NEUT# Latest Range: 1.7-7.7 K/uL 1.7  Lymphocytes Absolute Latest Range: 0.7-4.0 K/uL 3.8  Monocytes Absolute Latest Range: 0.1-1.0 K/uL 0.6  Eosinophils Absolute Latest Range: 0.0-0.7 K/uL 0.4  Basophils Absolute Latest Range:  0.0-0.1 K/uL 0.0     ASSESSMENT:  1. CLL, stage 0, presenting in October 2006 but by the end of June 2010 his white count risen to 162,500 and eventually as high as 190,000. His beta-2 microglobulin level was up to 8.06 his platelets remain good but he became hypogammaglobulinemic. He had developed diffuse lymphadenopathy and he was treated with fludarabine Cytoxan and Rituxan for 3 cycles before he developed significant pancytopenia which prompted the halt in chemotherapy.  His counts have recovered nicely and he remains asymptomatic. 2. Mild obesity  3. DM    PLAN:  1. I personally reviewed and went over laboratory results with the patient. 2. Lab work in 4 months: CBC diff, CMET, LDH, ESR, B-2 microglobulin. 3. Return in 4 months for follow-up.  All questions were answered. The patient knows to call the clinic with any problems, questions or concerns. We can certainly see the patient much sooner if necessary.  The patient and plan discussed with Glenford Peers, MD and he is in agreement with the aforementioned.  KEFALAS,THOMAS

## 2011-12-31 ENCOUNTER — Encounter (HOSPITAL_COMMUNITY): Payer: Medicare Other | Attending: Oncology

## 2011-12-31 DIAGNOSIS — C9110 Chronic lymphocytic leukemia of B-cell type not having achieved remission: Secondary | ICD-10-CM | POA: Insufficient documentation

## 2011-12-31 DIAGNOSIS — Z452 Encounter for adjustment and management of vascular access device: Secondary | ICD-10-CM

## 2011-12-31 MED ORDER — HEPARIN SOD (PORK) LOCK FLUSH 100 UNIT/ML IV SOLN
INTRAVENOUS | Status: AC
  Filled 2011-12-31: qty 5

## 2011-12-31 MED ORDER — HEPARIN SOD (PORK) LOCK FLUSH 100 UNIT/ML IV SOLN
500.0000 [IU] | Freq: Once | INTRAVENOUS | Status: AC
  Administered 2011-12-31: 500 [IU] via INTRAVENOUS
  Filled 2011-12-31: qty 5

## 2011-12-31 MED ORDER — SODIUM CHLORIDE 0.9 % IJ SOLN
10.0000 mL | Freq: Once | INTRAMUSCULAR | Status: AC
  Administered 2011-12-31: 10 mL via INTRAVENOUS
  Filled 2011-12-31: qty 10

## 2011-12-31 NOTE — Progress Notes (Signed)
Randall Reyes presented for Portacath access and flush. Proper placement of portacath confirmed by CXR. Portacath located rt chest wall accessed with  H 20 needle. No blood return present. Portacath flushed with 20ml NS and 500U/19ml Heparin and needle removed intact. Procedure without incident. Patient tolerated procedure well.

## 2012-02-11 ENCOUNTER — Encounter (HOSPITAL_COMMUNITY): Payer: Medicare Other | Attending: Oncology

## 2012-02-11 DIAGNOSIS — Z452 Encounter for adjustment and management of vascular access device: Secondary | ICD-10-CM

## 2012-02-11 DIAGNOSIS — C9110 Chronic lymphocytic leukemia of B-cell type not having achieved remission: Secondary | ICD-10-CM | POA: Insufficient documentation

## 2012-02-11 MED ORDER — SODIUM CHLORIDE 0.9 % IJ SOLN
20.0000 mL | INTRAMUSCULAR | Status: DC | PRN
  Administered 2012-02-11: 20 mL via INTRAVENOUS
  Filled 2012-02-11: qty 20

## 2012-02-11 MED ORDER — HEPARIN SOD (PORK) LOCK FLUSH 100 UNIT/ML IV SOLN
INTRAVENOUS | Status: AC
  Filled 2012-02-11: qty 5

## 2012-02-11 MED ORDER — HEPARIN SOD (PORK) LOCK FLUSH 100 UNIT/ML IV SOLN
500.0000 [IU] | Freq: Once | INTRAVENOUS | Status: AC
  Administered 2012-02-11: 500 [IU] via INTRAVENOUS
  Filled 2012-02-11: qty 5

## 2012-02-11 MED ORDER — SODIUM CHLORIDE 0.9 % IJ SOLN
INTRAMUSCULAR | Status: AC
  Filled 2012-02-11: qty 20

## 2012-02-11 NOTE — Progress Notes (Signed)
Randall Reyes presented for Portacath access and flush. Proper placement of portacath confirmed by CXR. Portacath located right chest wall accessed with  H 20 needle. No blood return and flushes well without resistance or pain.   Portacath flushed with 20ml NS and 500U/59ml Heparin and needle removed intact. Procedure without incident. Patient tolerated procedure well.

## 2012-03-14 ENCOUNTER — Ambulatory Visit (HOSPITAL_COMMUNITY)
Admission: RE | Admit: 2012-03-14 | Discharge: 2012-03-14 | Disposition: A | Discharge status: Home / Self Care | Payer: Medicare Other | Source: Ambulatory Visit | Attending: Family Medicine | Admitting: Family Medicine

## 2012-03-14 ENCOUNTER — Other Ambulatory Visit (HOSPITAL_COMMUNITY): Payer: Self-pay | Admitting: Family Medicine

## 2012-03-14 DIAGNOSIS — Z856 Personal history of leukemia: Secondary | ICD-10-CM | POA: Insufficient documentation

## 2012-03-14 DIAGNOSIS — Z87891 Personal history of nicotine dependence: Secondary | ICD-10-CM

## 2012-03-24 ENCOUNTER — Encounter (HOSPITAL_COMMUNITY): Payer: Medicare Other | Attending: Oncology

## 2012-03-24 ENCOUNTER — Encounter (HOSPITAL_COMMUNITY): Payer: Medicare Other

## 2012-03-24 DIAGNOSIS — C9110 Chronic lymphocytic leukemia of B-cell type not having achieved remission: Secondary | ICD-10-CM | POA: Insufficient documentation

## 2012-03-24 LAB — CBC
HCT: 34.7 % — ABNORMAL LOW (ref 39.0–52.0)
Hemoglobin: 11.8 g/dL — ABNORMAL LOW (ref 13.0–17.0)
MCH: 36 pg — ABNORMAL HIGH (ref 26.0–34.0)
MCHC: 34 g/dL (ref 30.0–36.0)
RDW: 13.9 % (ref 11.5–15.5)

## 2012-03-24 LAB — DIFFERENTIAL
Basophils Absolute: 0 10*3/uL (ref 0.0–0.1)
Basophils Relative: 0 % (ref 0–1)
Eosinophils Absolute: 0.4 10*3/uL (ref 0.0–0.7)
Monocytes Absolute: 0.6 10*3/uL (ref 0.1–1.0)
Monocytes Relative: 10 % (ref 3–12)
Neutro Abs: 1.7 10*3/uL (ref 1.7–7.7)
Neutrophils Relative %: 29 % — ABNORMAL LOW (ref 43–77)

## 2012-03-24 LAB — COMPREHENSIVE METABOLIC PANEL
AST: 26 U/L (ref 0–37)
Albumin: 3.7 g/dL (ref 3.5–5.2)
BUN: 18 mg/dL (ref 6–23)
Creatinine, Ser: 1.24 mg/dL (ref 0.50–1.35)
Total Protein: 7.5 g/dL (ref 6.0–8.3)

## 2012-03-24 LAB — SEDIMENTATION RATE: Sed Rate: 50 mm/hr — ABNORMAL HIGH (ref 0–16)

## 2012-03-24 LAB — LACTATE DEHYDROGENASE: LDH: 143 U/L (ref 94–250)

## 2012-03-24 MED ORDER — HEPARIN SOD (PORK) LOCK FLUSH 100 UNIT/ML IV SOLN
500.0000 [IU] | Freq: Once | INTRAVENOUS | Status: AC
  Administered 2012-03-24: 500 [IU] via INTRAVENOUS
  Filled 2012-03-24: qty 5

## 2012-03-24 MED ORDER — SODIUM CHLORIDE 0.9 % IJ SOLN
10.0000 mL | INTRAMUSCULAR | Status: DC | PRN
  Administered 2012-03-24: 10 mL via INTRAVENOUS
  Filled 2012-03-24: qty 10

## 2012-03-24 MED ORDER — SODIUM CHLORIDE 0.9 % IJ SOLN
INTRAMUSCULAR | Status: AC
  Filled 2012-03-24: qty 10

## 2012-03-24 NOTE — Progress Notes (Signed)
Also had port flush.  Tolerated well.  No blood return.

## 2012-03-24 NOTE — Progress Notes (Signed)
Pt insisted his port flush appt was 11/12.  Stated he was here for labs(by venipuncture) today and a port flush tomorrow.

## 2012-03-25 LAB — BETA 2 MICROGLOBULIN, SERUM: Beta-2 Microglobulin: 3.9 mg/L — ABNORMAL HIGH (ref 1.01–1.73)

## 2012-03-26 ENCOUNTER — Encounter (HOSPITAL_COMMUNITY): Payer: Self-pay | Admitting: Oncology

## 2012-03-26 ENCOUNTER — Encounter (HOSPITAL_BASED_OUTPATIENT_CLINIC_OR_DEPARTMENT_OTHER): Payer: Medicare Other | Admitting: Oncology

## 2012-03-26 VITALS — BP 131/76 | HR 58 | Temp 96.5°F | Resp 18 | Wt 151.2 lb

## 2012-03-26 DIAGNOSIS — Z23 Encounter for immunization: Secondary | ICD-10-CM

## 2012-03-26 DIAGNOSIS — C9111 Chronic lymphocytic leukemia of B-cell type in remission: Secondary | ICD-10-CM

## 2012-03-26 DIAGNOSIS — C9110 Chronic lymphocytic leukemia of B-cell type not having achieved remission: Secondary | ICD-10-CM

## 2012-03-26 MED ORDER — INFLUENZA VIRUS VACC SPLIT PF IM SUSP
INTRAMUSCULAR | Status: AC
  Filled 2012-03-26: qty 0.5

## 2012-03-26 MED ORDER — INFLUENZA VIRUS VACC SPLIT PF IM SUSP
0.5000 mL | INTRAMUSCULAR | Status: AC
  Administered 2012-03-26: 0.5 mL via INTRAMUSCULAR

## 2012-03-26 NOTE — Progress Notes (Signed)
Chronic lymphocytic leukemia here for followup. He is status post 3 cycles of FCR. He was treated in 2010 after his white count rose 290,000, and he developed diffuse lymphadenopathy, hypogammaglobulinemia, and a beta microglobulin level of 8.06. He is doing very well but developed pancytopenia prompting [place] to halt chemotherapy after 3 cycles. He has remained in remission since then however. Today he's doing very well and is accompanied by his wife. He has no complaints on oncology review of systems. BP 131/76  Pulse 58  Temp 96.5 F (35.8 C) (Oral)  Resp 18  Wt 151 lb 3.2 oz (68.584 kg)  He is in no acute distress. There is no palpable lymphadenopathy in any location. Lungs are clear. Heart shows a regular rhythm and rate without murmur rub or gallop. Port-A-Cath is present in the right upper chest wall and remains intact. He has no hepatosplenomegaly. Bowel sounds are normal. He has no leg or arm edema. Facial symmetry remains intact. He would like the flu shot today which we will give him and we'll see him back in 3 months. He remains in remission

## 2012-03-26 NOTE — Progress Notes (Signed)
Randall Reyes presents today for injection per MD orders. Flu Vaccine administered IM in Deltoid. Administration without incident. Patient tolerated well.

## 2012-03-26 NOTE — Patient Instructions (Addendum)
[facility]  Discharge Instructions  RECOMMENDATIONS MADE BY THE CONSULTANT AND ANY TEST RESULTS WILL BE SENT TO YOUR REFERRING DOCTOR.   EXAM FINDINGS BY MD TODAY AND SIGNS AND SYMPTOMS TO REPORT TO CLINIC OR PRIMARY MD: exam and discussion by MD. Bonita Quin are doing well.  Will give you the Flu vaccine today  MEDICATIONS PRESCRIBED: none   INSTRUCTIONS GIVEN AND DISCUSSED: Other :  Report fevers, recurring infections,etc.  SPECIAL INSTRUCTIONS/FOLLOW-UP: Lab work Needed in 3 months and Return to Clinic after labs in 3 months.   I acknowledge that I have been informed and understand all the instructions given to me and received a copy. I do not have any more questions at this time, but understand that I may call the Specialty Clinic at [facility] at [phone] during business hours should I have any further questions or need assistance in obtaining follow-up care.    __________________________________________  _____________  __________ Signature of Patient or Authorized Representative            Date                   Time    __________________________________________ Nurse's Signature

## 2012-04-28 ENCOUNTER — Encounter (HOSPITAL_COMMUNITY): Payer: Self-pay | Admitting: Pharmacy Technician

## 2012-04-28 NOTE — Patient Instructions (Signed)
C20 Randall Reyes  04/28/2012   Your procedure is scheduled on:  05/05/12  Report to [name] at 1200 PM.  Call this number if you have problems the morning of surgery: [phone]   Remember:   Do not eat food:After Midnight.  May have clear liquids:until Midnight .  Clear liquids include soda, tea, black coffee, apple or grape juice, broth.  Take these medicines the morning of surgery with A SIP OF WATER: lisinopril   Do not wear jewelry, make-up or nail polish.  Do not wear lotions, powders, or perfumes. You may wear deodorant.  Do not shave 48 hours prior to surgery. Men may shave face and neck.  Do not bring valuables to the hospital.  Contacts, dentures or bridgework may not be worn into surgery.  Leave suitcase in the car. After surgery it may be brought to your room.  For patients admitted to the hospital, checkout time is 11:00 AM the day of discharge.   Patients discharged the day of surgery will not be allowed to drive home.  Name and phone number of your driver: family  Special Instructions: N/A   Please read over the following fact sheets that you were given: Anesthesia Post-op Instructions and Care and Recovery After Surgery   PATIENT INSTRUCTIONS POST-ANESTHESIA  IMMEDIATELY FOLLOWING SURGERY:  Do not drive or operate machinery for the first twenty four hours after surgery.  Do not make any important decisions for twenty four hours after surgery or while taking narcotic pain medications or sedatives.  If you develop intractable nausea and vomiting or a severe headache please notify your doctor immediately.  FOLLOW-UP:  Please make an appointment with your surgeon as instructed. You do not need to follow up with anesthesia unless specifically instructed to do so.  WOUND CARE INSTRUCTIONS (if applicable):  Keep a dry clean dressing on the anesthesia/puncture wound site if there is drainage.  Once the wound has quit draining you may leave it open to air.  Generally  you should leave the bandage intact for twenty four hours unless there is drainage.  If the epidural site drains for more than 36-48 hours please call the anesthesia department.  QUESTIONS?:  Please feel free to call your physician or the hospital operator if you have any questions, and they will be happy to assist you.      ataract Surgery  A cataract is a clouding of the lens of the eye. When a lens becomes cloudy, vision is reduced based on the degree and nature of the clouding. Surgery may be needed to improve vision. Surgery removes the cloudy lens and usually replaces it with a substitute lens (intraocular lens, IOL). LET YOUR EYE DOCTOR KNOW ABOUT:  Allergies to food or medicine.  Medicines taken including herbs, eyedrops, over-the-counter medicines, and creams.  Use of steroids (by mouth or creams).  Previous problems with anesthetics or numbing medicine.  History of bleeding problems or blood clots.  Previous surgery.  Other health problems, including diabetes and kidney problems.  Possibility of pregnancy, if this applies. RISKS AND COMPLICATIONS  Infection.  Inflammation of the eyeball (endophthalmitis) that can spread to both eyes (sympathetic ophthalmia).  Poor wound healing.  If an IOL is inserted, it can later fall out of proper position. This is very uncommon.  Clouding of the part of your eye that holds an IOL in place. This is called an "after-cataract." These are uncommon, but easily treated. BEFORE THE PROCEDURE  Do not eat or drink  anything except small amounts of water for 8 to 12 before your surgery, or as directed by your caregiver.  Unless you are told otherwise, continue any eyedrops you have been prescribed.  Talk to your primary caregiver about all other medicines that you take (both prescription and non-prescription). In some cases, you may need to stop or change medicines near the time of your surgery. This is most important if you are taking  blood-thinning medicine.Do not stop medicines unless you are told to do so.  Arrange for someone to drive you to and from the procedure.  Do not put contact lenses in either eye on the day of your surgery. PROCEDURE There is more than one method for safely removing a cataract. Your doctor can explain the differences and help determine which is best for you. Phacoemulsification surgery is the most common form of cataract surgery.  An injection is given behind the eye or eyedrops are given to make this a painless procedure.  A small cut (incision) is made on the edge of the clear, dome-shaped surface that covers the front of the eye (cornea).  A tiny probe is painlessly inserted into the eye. This device gives off ultrasound waves that soften and break up the cloudy center of the lens. This makes it easier for the cloudy lens to be removed by suction.  An IOL may be implanted.  The normal lens of the eye is covered by a clear capsule. Part of that capsule is intentionally left in the eye to support the IOL.  Your surgeon may or may not use stitches to close the incision. There are other forms of cataract surgery that require a larger incision and stiches to close the eye. This approach is taken in cases where the doctor feels that the cataract cannot be easily removed using phacoemulsification. AFTER THE PROCEDURE  When an IOL is implanted, it does not need care. It becomes a permanent part of your eye and cannot be seen or felt.  Your doctor will schedule follow-up exams to check on your progress.  Review your other medicines with your doctor to see which can be resumed after surgery.  Use eyedrops or take medicine as prescribed by your doctor. Document Released: 04/19/2011 Document Revised: 07/23/2011 Document Reviewed: 04/19/2011 [facility] Patient Information 2013 [place], [place].

## 2012-04-29 ENCOUNTER — Other Ambulatory Visit: Payer: Self-pay

## 2012-04-29 ENCOUNTER — Encounter (HOSPITAL_COMMUNITY)
Admission: RE | Admit: 2012-04-29 | Discharge: 2012-04-29 | Disposition: A | Discharge status: Home / Self Care | Payer: Medicare Other | Source: Ambulatory Visit | Attending: Ophthalmology | Admitting: Ophthalmology

## 2012-04-29 ENCOUNTER — Encounter (HOSPITAL_COMMUNITY): Payer: Self-pay

## 2012-04-29 LAB — BASIC METABOLIC PANEL
Calcium: 9.4 mg/dL (ref 8.4–10.5)
GFR calc non Af Amer: 45 mL/min — ABNORMAL LOW (ref >90)
Glucose, Bld: 119 mg/dL — ABNORMAL HIGH (ref 70–99)
Sodium: 140 mEq/L (ref 135–145)

## 2012-04-29 LAB — HEMOGLOBIN AND HEMATOCRIT, BLOOD: HCT: 35.7 % — ABNORMAL LOW (ref 39.0–52.0)

## 2012-05-02 ENCOUNTER — Encounter (HOSPITAL_COMMUNITY): Payer: Self-pay

## 2012-05-02 ENCOUNTER — Encounter (HOSPITAL_COMMUNITY): Payer: Medicare Other | Attending: Oncology

## 2012-05-02 DIAGNOSIS — C9110 Chronic lymphocytic leukemia of B-cell type not having achieved remission: Secondary | ICD-10-CM

## 2012-05-02 DIAGNOSIS — Z452 Encounter for adjustment and management of vascular access device: Secondary | ICD-10-CM

## 2012-05-02 DIAGNOSIS — Z95828 Presence of other vascular implants and grafts: Secondary | ICD-10-CM | POA: Insufficient documentation

## 2012-05-02 DIAGNOSIS — Z9889 Other specified postprocedural states: Secondary | ICD-10-CM | POA: Insufficient documentation

## 2012-05-02 HISTORY — DX: Presence of other vascular implants and grafts: Z95.828

## 2012-05-02 MED ORDER — HEPARIN SOD (PORK) LOCK FLUSH 100 UNIT/ML IV SOLN
INTRAVENOUS | Status: AC
  Filled 2012-05-02: qty 5

## 2012-05-02 MED ORDER — LIDOCAINE HCL 3.5 % OP GEL
OPHTHALMIC | Status: AC
  Filled 2012-05-02: qty 5

## 2012-05-02 MED ORDER — TETRACAINE HCL 0.5 % OP SOLN
OPHTHALMIC | Status: AC
  Filled 2012-05-02: qty 2

## 2012-05-02 MED ORDER — LIDOCAINE HCL (PF) 1 % IJ SOLN
INTRAMUSCULAR | Status: AC
  Filled 2012-05-02: qty 2

## 2012-05-02 MED ORDER — CYCLOPENTOLATE-PHENYLEPHRINE 0.2-1 % OP SOLN
OPHTHALMIC | Status: AC
  Filled 2012-05-02: qty 2

## 2012-05-02 MED ORDER — HEPARIN SOD (PORK) LOCK FLUSH 100 UNIT/ML IV SOLN
500.0000 [IU] | Freq: Once | INTRAVENOUS | Status: AC
  Administered 2012-05-02: 500 [IU] via INTRAVENOUS
  Filled 2012-05-02: qty 5

## 2012-05-02 MED ORDER — PHENYLEPHRINE HCL 2.5 % OP SOLN
OPHTHALMIC | Status: AC
  Filled 2012-05-02: qty 2

## 2012-05-02 MED ORDER — NEOMYCIN-POLYMYXIN-DEXAMETH 3.5-10000-0.1 OP OINT
TOPICAL_OINTMENT | OPHTHALMIC | Status: AC
  Filled 2012-05-02: qty 3.5

## 2012-05-02 MED ORDER — SODIUM CHLORIDE 0.9 % IJ SOLN
10.0000 mL | INTRAMUSCULAR | Status: DC | PRN
  Administered 2012-05-02: 10 mL via INTRAVENOUS
  Filled 2012-05-02: qty 10

## 2012-05-02 MED ORDER — SODIUM CHLORIDE 0.9 % IJ SOLN
INTRAMUSCULAR | Status: AC
  Filled 2012-05-02: qty 10

## 2012-05-02 NOTE — Progress Notes (Signed)
Randall Reyes presented for Portacath access and flush. Proper placement of portacath confirmed by CXR. Portacath located right chest wall accessed with  H 20 needle. No blood return and according to pt hasn't given blood in a while. Easily flushed. Portacath flushed with 20ml NS and 500U/73ml Heparin and needle removed intact. Procedure without incident. Patient tolerated procedure well.

## 2012-05-05 ENCOUNTER — Encounter (HOSPITAL_COMMUNITY)
Admission: RE | Disposition: A | Discharge status: Home / Self Care | Payer: Self-pay | Source: Ambulatory Visit | Attending: Ophthalmology

## 2012-05-05 ENCOUNTER — Encounter (HOSPITAL_COMMUNITY): Payer: Self-pay | Admitting: Anesthesiology

## 2012-05-05 ENCOUNTER — Ambulatory Visit (HOSPITAL_COMMUNITY): Payer: Medicare Other | Admitting: Anesthesiology

## 2012-05-05 ENCOUNTER — Ambulatory Visit (HOSPITAL_COMMUNITY)
Admission: RE | Admit: 2012-05-05 | Discharge: 2012-05-05 | Disposition: A | Discharge status: Home / Self Care | Payer: Medicare Other | Source: Ambulatory Visit | Attending: Ophthalmology | Admitting: Ophthalmology

## 2012-05-05 ENCOUNTER — Encounter (HOSPITAL_COMMUNITY): Payer: Self-pay | Admitting: *Deleted

## 2012-05-05 DIAGNOSIS — E119 Type 2 diabetes mellitus without complications: Secondary | ICD-10-CM | POA: Insufficient documentation

## 2012-05-05 DIAGNOSIS — Z01812 Encounter for preprocedural laboratory examination: Secondary | ICD-10-CM | POA: Insufficient documentation

## 2012-05-05 DIAGNOSIS — Z01810 Encounter for preprocedural cardiovascular examination: Secondary | ICD-10-CM | POA: Insufficient documentation

## 2012-05-05 DIAGNOSIS — I10 Essential (primary) hypertension: Secondary | ICD-10-CM | POA: Insufficient documentation

## 2012-05-05 DIAGNOSIS — H2510 Age-related nuclear cataract, unspecified eye: Secondary | ICD-10-CM | POA: Insufficient documentation

## 2012-05-05 HISTORY — PX: CATARACT EXTRACTION W/PHACO: SHX586

## 2012-05-05 SURGERY — PHACOEMULSIFICATION, CATARACT, WITH IOL INSERTION
Anesthesia: Monitor Anesthesia Care | Site: Eye | Laterality: Right | Wound class: Clean

## 2012-05-05 MED ORDER — MIDAZOLAM HCL 2 MG/2ML IJ SOLN
1.0000 mg | INTRAMUSCULAR | Status: DC | PRN
  Administered 2012-05-05 (×2): 2 mg via INTRAVENOUS

## 2012-05-05 MED ORDER — NEOMYCIN-POLYMYXIN-DEXAMETH 0.1 % OP OINT
TOPICAL_OINTMENT | OPHTHALMIC | Status: DC | PRN
  Administered 2012-05-05: 1 via OPHTHALMIC

## 2012-05-05 MED ORDER — LIDOCAINE HCL (PF) 1 % IJ SOLN
INTRAMUSCULAR | Status: DC | PRN
  Administered 2012-05-05: .6 mL

## 2012-05-05 MED ORDER — LACTATED RINGERS IV SOLN
INTRAVENOUS | Status: DC
  Administered 2012-05-05: 13:00:00 via INTRAVENOUS

## 2012-05-05 MED ORDER — PROVISC 10 MG/ML IO SOLN
INTRAOCULAR | Status: DC | PRN
  Administered 2012-05-05: 8.5 mg via INTRAOCULAR

## 2012-05-05 MED ORDER — PHENYLEPHRINE HCL 2.5 % OP SOLN
1.0000 [drp] | OPHTHALMIC | Status: AC
  Administered 2012-05-05 (×3): 1 [drp] via OPHTHALMIC

## 2012-05-05 MED ORDER — MIDAZOLAM HCL 2 MG/2ML IJ SOLN
INTRAMUSCULAR | Status: AC
  Filled 2012-05-05: qty 2

## 2012-05-05 MED ORDER — POVIDONE-IODINE 5 % OP SOLN
OPHTHALMIC | Status: DC | PRN
  Administered 2012-05-05: 1 via OPHTHALMIC

## 2012-05-05 MED ORDER — LIDOCAINE HCL 3.5 % OP GEL
1.0000 "application " | Freq: Once | OPHTHALMIC | Status: AC
  Administered 2012-05-05: 1 via OPHTHALMIC

## 2012-05-05 MED ORDER — EPINEPHRINE HCL 1 MG/ML IJ SOLN
INTRAMUSCULAR | Status: AC
  Filled 2012-05-05: qty 1

## 2012-05-05 MED ORDER — BSS IO SOLN
INTRAOCULAR | Status: DC | PRN
  Administered 2012-05-05: 15 mL via INTRAOCULAR

## 2012-05-05 MED ORDER — CYCLOPENTOLATE-PHENYLEPHRINE 0.2-1 % OP SOLN
1.0000 [drp] | OPHTHALMIC | Status: AC
  Administered 2012-05-05 (×3): 1 [drp] via OPHTHALMIC

## 2012-05-05 MED ORDER — EPINEPHRINE HCL 1 MG/ML IJ SOLN
INTRAOCULAR | Status: DC | PRN
  Administered 2012-05-05: 13:00:00

## 2012-05-05 MED ORDER — LACTATED RINGERS IV SOLN
INTRAVENOUS | Status: DC | PRN
  Administered 2012-05-05: 13:00:00 via INTRAVENOUS

## 2012-05-05 MED ORDER — TETRACAINE HCL 0.5 % OP SOLN
1.0000 [drp] | OPHTHALMIC | Status: AC
  Administered 2012-05-05 (×3): 1 [drp] via OPHTHALMIC

## 2012-05-05 SURGICAL SUPPLY — 32 items

## 2012-05-05 NOTE — Preoperative (Signed)
Beta Blockers   Reason not to administer Beta Blockers:Not Applicable 

## 2012-05-05 NOTE — H&P (Signed)
I have reviewed the H&P, the patient was re-examined, and I have identified no interval changes in medical condition and plan of care since the history and physical of record  

## 2012-05-05 NOTE — Brief Op Note (Signed)
Pre-Op Dx: Cataract OD Post-Op Dx: Cataract OD Surgeon: Shaul Trautman Anesthesia: Topical with MAC Surgery: Cataract Extraction with Intraocular lens Implant OD Implant: B&L enVista Specimen: None Complications: None 

## 2012-05-05 NOTE — Anesthesia Procedure Notes (Signed)
Procedure Name: MAC Date/Time: 05/05/2012 1:18 PM Performed by: Carolyne Littles, Jobina Maita L Pre-anesthesia Checklist: Patient identified, Patient being monitored, Emergency Drugs available, Timeout performed and Suction available Oxygen Delivery Method: Nasal cannula

## 2012-05-05 NOTE — Anesthesia Preprocedure Evaluation (Signed)
Anesthesia Evaluation  Patient identified by MRN, date of birth, ID band Patient awake    Reviewed: Allergy & Precautions, H&P , NPO status , Patient's Chart, lab work & pertinent test results  Airway Mallampati: II      Dental  (+) Edentulous Upper and Edentulous Lower   Pulmonary neg pulmonary ROS,  breath sounds clear to auscultation        Cardiovascular hypertension, Pt. on medications Rhythm:Regular Rate:Normal     Neuro/Psych    GI/Hepatic   Endo/Other  diabetes, Well Controlled, Type 2, Oral Hypoglycemic Agents  Renal/GU      Musculoskeletal   Abdominal   Peds  Hematology  (+) Blood dyscrasia, anemia ,   Anesthesia Other Findings   Reproductive/Obstetrics                           Anesthesia Physical Anesthesia Plan  ASA: III  Anesthesia Plan: MAC   Post-op Pain Management:    Induction: Intravenous  Airway Management Planned: Nasal Cannula  Additional Equipment:   Intra-op Plan:   Post-operative Plan:   Informed Consent: I have reviewed the patients History and Physical, chart, labs and discussed the procedure including the risks, benefits and alternatives for the proposed anesthesia with the patient or authorized representative who has indicated his/her understanding and acceptance.     Plan Discussed with:   Anesthesia Plan Comments:         Anesthesia Quick Evaluation

## 2012-05-05 NOTE — Anesthesia Postprocedure Evaluation (Signed)
  Anesthesia Post-op Note  Patient: Randall Reyes  Procedure(s) Performed: Procedure(s) (LRB) with comments: CATARACT EXTRACTION PHACO AND INTRAOCULAR LENS PLACEMENT (IOC) (Right) - CDE:13.25  Patient Location: Short stay  Anesthesia Type: MAC  Level of Consciousness: awake, alert , oriented and patient cooperative  Airway and Oxygen Therapy: Patient Spontanous Breathing room air  Post-op Pain: mild  Post-op Assessment: Post-op Vital signs reviewed, Patient's Cardiovascular Status Stable, Respiratory Function Stable, Patent Airway and No signs of Nausea or vomiting  Post-op Vital Signs: Reviewed and stable  Complications: No apparent anesthesia complications  

## 2012-05-05 NOTE — Transfer of Care (Signed)
  Anesthesia Post-op Note  Patient: Randall Reyes  Procedure(s) Performed: Procedure(s) (LRB) with comments: CATARACT EXTRACTION PHACO AND INTRAOCULAR LENS PLACEMENT (IOC) (Right) - CDE:13.25  Patient Location: Short stay  Anesthesia Type: MAC  Level of Consciousness: awake, alert , oriented and patient cooperative  Airway and Oxygen Therapy: Patient Spontanous Breathing room air  Post-op Pain: mild  Post-op Assessment: Post-op Vital signs reviewed, Patient's Cardiovascular Status Stable, Respiratory Function Stable, Patent Airway and No signs of Nausea or vomiting  Post-op Vital Signs: Reviewed and stable  Complications: No apparent anesthesia complications

## 2012-05-06 NOTE — Op Note (Signed)
NAME:  GILDARDO, TICKNER NO.:  [id number]  MEDICAL RECORD NO.:  [id number]  LOCATION:  [facility]                          FACILITY:  [facility]  PHYSICIAN:  Susanne Greenhouse, MD       DATE OF BIRTH:  Dec 02, 1935  DATE OF PROCEDURE:  05/05/2012 DATE OF DISCHARGE:  05/05/2012                              OPERATIVE REPORT   PREOPERATIVE DIAGNOSIS:  Nuclear cataract, right eye, diagnosis code 366.16.  POSTOPERATIVE DIAGNOSIS:  Nuclear cataract, right eye, diagnosis code 366.16.  OPERATION PERFORMED:  Phacoemulsification with posterior chamber intraocular lens implantation, right eye.  SURGEON:  Susanne Greenhouse, MD  ANESTHESIA:  Topical with monitored anesthesia care and IV sedation.  OPERATIVE SUMMARY:  In the preoperative area, dilating drops were placed into the right eye.  The patient was then brought into the operating room where he was placed under topical anesthesia and IV sedation.  The eye was then prepped and draped.  Beginning with a 75 blade, a paracentesis port was made at the surgeon's 2 o'clock position.  The anterior chamber was then filled with a 1% nonpreserved lidocaine solution with epinephrine.  This was followed by Viscoat to deepen the chamber.  A small fornix-based peritomy was performed superiorly.  Next, a single iris hook was placed through the limbus superiorly.  A 2.4-mm keratome blade was then used to make a clear corneal incision over the iris hook.  A bent cystotome needle and Utrata forceps were used to create a continuous tear capsulotomy.  Hydrodissection was performed using balanced salt solution on a fine cannula.  The lens nucleus was then removed using phacoemulsification in a quadrant cracking technique. The cortical material was then removed with irrigation and aspiration. The capsular bag and anterior chamber were refilled with Provisc.  The wound was widened to approximately 3 mm and a posterior chamber intraocular lens was placed into the  capsular bag without difficulty using an [employer] lens injecting system.  A single 10-0 nylon suture was then used to close the incision as well as stromal hydration. The Provisc was removed from the anterior chamber and capsular bag with irrigation and aspiration.  At this point, the wounds were tested for leak, which were negative.  The anterior chamber remained deep and stable.  The patient tolerated the procedure well.  There were no operative complications, and he awoke from topical anesthesia and IV sedation without problem.  No surgical specimens.  Prosthetic device used is a Theme park manager, model EnVista, model number MX60, power of 31.5, serial number is [id number].          ______________________________ Susanne Greenhouse, MD     KEH/MEDQ  D:  05/05/2012  T:  05/06/2012  Job:  [id number]

## 2012-05-09 ENCOUNTER — Encounter (HOSPITAL_COMMUNITY): Payer: Self-pay | Admitting: Ophthalmology

## 2012-05-13 ENCOUNTER — Encounter (HOSPITAL_COMMUNITY): Payer: Self-pay

## 2012-05-13 ENCOUNTER — Encounter (HOSPITAL_COMMUNITY)
Admission: RE | Admit: 2012-05-13 | Discharge: 2012-05-13 | Payer: Medicare Other | Source: Ambulatory Visit | Admitting: Ophthalmology

## 2012-05-13 MED ORDER — TETRACAINE HCL 0.5 % OP SOLN
OPHTHALMIC | Status: AC
  Filled 2012-05-13: qty 2

## 2012-05-13 MED ORDER — CYCLOPENTOLATE-PHENYLEPHRINE 0.2-1 % OP SOLN
OPHTHALMIC | Status: AC
  Filled 2012-05-13: qty 2

## 2012-05-13 MED ORDER — LIDOCAINE HCL 3.5 % OP GEL
OPHTHALMIC | Status: AC
  Filled 2012-05-13: qty 5

## 2012-05-13 MED ORDER — ONDANSETRON HCL 4 MG/2ML IJ SOLN: 4.0000 mg | Freq: Once | INTRAMUSCULAR | Status: AC | PRN

## 2012-05-13 MED ORDER — LIDOCAINE HCL (PF) 1 % IJ SOLN
INTRAMUSCULAR | Status: AC
  Filled 2012-05-13: qty 2

## 2012-05-13 MED ORDER — NEOMYCIN-POLYMYXIN-DEXAMETH 3.5-10000-0.1 OP OINT
TOPICAL_OINTMENT | OPHTHALMIC | Status: AC
  Filled 2012-05-13: qty 3.5

## 2012-05-13 MED ORDER — PHENYLEPHRINE HCL 2.5 % OP SOLN
OPHTHALMIC | Status: AC
  Filled 2012-05-13: qty 2

## 2012-05-13 MED ORDER — FENTANYL CITRATE 0.05 MG/ML IJ SOLN: 25.0000 ug | INTRAMUSCULAR | Status: DC | PRN

## 2012-05-15 ENCOUNTER — Encounter (HOSPITAL_COMMUNITY): Payer: Self-pay | Admitting: Anesthesiology

## 2012-05-15 ENCOUNTER — Encounter (HOSPITAL_COMMUNITY)
Admission: RE | Disposition: A | Discharge status: Home / Self Care | Payer: Self-pay | Source: Ambulatory Visit | Attending: Ophthalmology

## 2012-05-15 ENCOUNTER — Ambulatory Visit (HOSPITAL_COMMUNITY): Payer: Medicare Other | Admitting: Anesthesiology

## 2012-05-15 ENCOUNTER — Ambulatory Visit (HOSPITAL_COMMUNITY)
Admission: RE | Admit: 2012-05-15 | Discharge: 2012-05-15 | Disposition: A | Discharge status: Home / Self Care | Payer: Medicare Other | Source: Ambulatory Visit | Attending: Ophthalmology | Admitting: Ophthalmology

## 2012-05-15 ENCOUNTER — Encounter (HOSPITAL_COMMUNITY): Payer: Self-pay | Admitting: *Deleted

## 2012-05-15 DIAGNOSIS — E119 Type 2 diabetes mellitus without complications: Secondary | ICD-10-CM | POA: Insufficient documentation

## 2012-05-15 DIAGNOSIS — H2510 Age-related nuclear cataract, unspecified eye: Secondary | ICD-10-CM | POA: Insufficient documentation

## 2012-05-15 DIAGNOSIS — I10 Essential (primary) hypertension: Secondary | ICD-10-CM | POA: Insufficient documentation

## 2012-05-15 DIAGNOSIS — Z01812 Encounter for preprocedural laboratory examination: Secondary | ICD-10-CM | POA: Insufficient documentation

## 2012-05-15 HISTORY — PX: CATARACT EXTRACTION W/PHACO: SHX586

## 2012-05-15 SURGERY — PHACOEMULSIFICATION, CATARACT, WITH IOL INSERTION
Anesthesia: Monitor Anesthesia Care | Site: Eye | Laterality: Left | Wound class: Clean

## 2012-05-15 MED ORDER — MIDAZOLAM HCL 2 MG/2ML IJ SOLN
1.0000 mg | INTRAMUSCULAR | Status: DC | PRN
  Administered 2012-05-15: 2 mg via INTRAVENOUS

## 2012-05-15 MED ORDER — LACTATED RINGERS IV SOLN
INTRAVENOUS | Status: DC
  Administered 2012-05-15: 1000 mL via INTRAVENOUS

## 2012-05-15 MED ORDER — LIDOCAINE 3.5 % OP GEL OPTIME - NO CHARGE
OPHTHALMIC | Status: DC | PRN
  Administered 2012-05-15: 2 [drp] via OPHTHALMIC

## 2012-05-15 MED ORDER — TETRACAINE HCL 0.5 % OP SOLN
1.0000 [drp] | OPHTHALMIC | Status: AC
  Administered 2012-05-15 (×3): 1 [drp] via OPHTHALMIC

## 2012-05-15 MED ORDER — CYCLOPENTOLATE-PHENYLEPHRINE 0.2-1 % OP SOLN
1.0000 [drp] | OPHTHALMIC | Status: AC
  Administered 2012-05-15 (×3): 1 [drp] via OPHTHALMIC

## 2012-05-15 MED ORDER — FENTANYL CITRATE 0.05 MG/ML IJ SOLN: 25.0000 ug | INTRAMUSCULAR | Status: DC | PRN

## 2012-05-15 MED ORDER — EPINEPHRINE HCL 1 MG/ML IJ SOLN
INTRAMUSCULAR | Status: AC
  Filled 2012-05-15: qty 1

## 2012-05-15 MED ORDER — LIDOCAINE HCL 3.5 % OP GEL
1.0000 "application " | Freq: Once | OPHTHALMIC | Status: AC
  Administered 2012-05-15: 1 via OPHTHALMIC

## 2012-05-15 MED ORDER — LIDOCAINE HCL (PF) 1 % IJ SOLN
INTRAMUSCULAR | Status: DC | PRN
  Administered 2012-05-15: .4 mL

## 2012-05-15 MED ORDER — ONDANSETRON HCL 4 MG/2ML IJ SOLN: 4.0000 mg | Freq: Once | INTRAMUSCULAR | Status: DC | PRN

## 2012-05-15 MED ORDER — MIDAZOLAM HCL 2 MG/2ML IJ SOLN
INTRAMUSCULAR | Status: AC
  Filled 2012-05-15: qty 2

## 2012-05-15 MED ORDER — PROVISC 10 MG/ML IO SOLN
INTRAOCULAR | Status: DC | PRN
  Administered 2012-05-15: 8.5 mg via INTRAOCULAR

## 2012-05-15 MED ORDER — EPINEPHRINE HCL 1 MG/ML IJ SOLN
INTRAOCULAR | Status: DC | PRN
  Administered 2012-05-15: 10:00:00

## 2012-05-15 MED ORDER — POVIDONE-IODINE 5 % OP SOLN
OPHTHALMIC | Status: DC | PRN
  Administered 2012-05-15: 1 via OPHTHALMIC

## 2012-05-15 MED ORDER — PHENYLEPHRINE HCL 2.5 % OP SOLN
1.0000 [drp] | OPHTHALMIC | Status: AC
  Administered 2012-05-15 (×3): 1 [drp] via OPHTHALMIC

## 2012-05-15 MED ORDER — NEOMYCIN-POLYMYXIN-DEXAMETH 0.1 % OP OINT
TOPICAL_OINTMENT | OPHTHALMIC | Status: DC | PRN
  Administered 2012-05-15: 1 via OPHTHALMIC

## 2012-05-15 MED ORDER — BSS IO SOLN
INTRAOCULAR | Status: DC | PRN
  Administered 2012-05-15: 15 mL via INTRAOCULAR

## 2012-05-15 SURGICAL SUPPLY — 32 items

## 2012-05-15 NOTE — Anesthesia Preprocedure Evaluation (Signed)
Anesthesia Evaluation  Patient identified by MRN, date of birth, ID band Patient awake    Reviewed: Allergy & Precautions, H&P , NPO status , Patient's Chart, lab work & pertinent test results  Airway Mallampati: II      Dental  (+) Edentulous Upper and Edentulous Lower   Pulmonary neg pulmonary ROS,  breath sounds clear to auscultation        Cardiovascular hypertension, Pt. on medications Rhythm:Regular Rate:Normal     Neuro/Psych    GI/Hepatic   Endo/Other  diabetes, Well Controlled, Type 2, Oral Hypoglycemic Agents  Renal/GU      Musculoskeletal   Abdominal   Peds  Hematology  (+) Blood dyscrasia, anemia ,   Anesthesia Other Findings   Reproductive/Obstetrics                           Anesthesia Physical Anesthesia Plan  ASA: III  Anesthesia Plan: MAC   Post-op Pain Management:    Induction: Intravenous  Airway Management Planned: Nasal Cannula  Additional Equipment:   Intra-op Plan:   Post-operative Plan:   Informed Consent: I have reviewed the patients History and Physical, chart, labs and discussed the procedure including the risks, benefits and alternatives for the proposed anesthesia with the patient or authorized representative who has indicated his/her understanding and acceptance.     Plan Discussed with:   Anesthesia Plan Comments:         Anesthesia Quick Evaluation  

## 2012-05-15 NOTE — Anesthesia Postprocedure Evaluation (Signed)
  Anesthesia Post-op Note  Patient: Randall Reyes  Procedure(s) Performed: Procedure(s) (LRB) with comments: CATARACT EXTRACTION PHACO AND INTRAOCULAR LENS PLACEMENT (IOC) (Left) - CDE:  12.32  Patient Location: PACU and Short Stay  Anesthesia Type:MAC  Level of Consciousness: awake, alert  and oriented  Airway and Oxygen Therapy: Patient Spontanous Breathing  Post-op Pain: none  Post-op Assessment: Post-op Vital signs reviewed, Patient's Cardiovascular Status Stable, Respiratory Function Stable, Patent Airway and No signs of Nausea or vomiting  Post-op Vital Signs: Reviewed and stable  Complications: No apparent anesthesia complications

## 2012-05-15 NOTE — Brief Op Note (Signed)
Pre-Op Dx: Cataract OS Post-Op Dx: Cataract OS Surgeon: Costantino Kohlbeck Anesthesia: Topical with MAC Surgery: Cataract Extraction with Intraocular lens Implant OS Implant: B&L enVista Specimen: None Complications: None 

## 2012-05-15 NOTE — Transfer of Care (Signed)
Immediate Anesthesia Transfer of Care Note  Patient: Randall Reyes  Procedure(s) Performed: Procedure(s) (LRB) with comments: CATARACT EXTRACTION PHACO AND INTRAOCULAR LENS PLACEMENT (IOC) (Left) - CDE:  12.32  Patient Location: PACU and Short Stay  Anesthesia Type:MAC  Level of Consciousness: awake  Airway & Oxygen Therapy: Patient Spontanous Breathing  Post-op Assessment: Report given to PACU RN  Post vital signs: Reviewed  Complications: No apparent anesthesia complications

## 2012-05-15 NOTE — Op Note (Signed)
NAME:  ANEESH, FALLER NO.:  [id number]  MEDICAL RECORD NO.:  [id number]  LOCATION:  [facility]                          FACILITY:  [facility]  PHYSICIAN:  Susanne Greenhouse, MD       DATE OF BIRTH:  [birth year]  DATE OF PROCEDURE:  05/15/2012 DATE OF DISCHARGE:  05/15/2012                              OPERATIVE REPORT   PREOPERATIVE DIAGNOSIS:  Nuclear cataract, left eye, diagnosis code 366.16.  POSTOPERATIVE DIAGNOSIS:  Nuclear cataract, left eye, diagnosis code 366.16.  OPERATION PERFORMED:  Phacoemulsification with posterior chamber intraocular lens implantation, left eye.  SURGEON:  Bonne Dolores. Gracielynn Birkel, MD.  ANESTHESIA:  Topical with monitored anesthesia care and IV sedation.  OPERATIVE SUMMARY:  In the preoperative area, dilating drops were placed into the left eye.  The patient was then brought into the operating room where he was placed under topical anesthesia and IV sedation.  The eye was then prepped and draped.  Beginning with a 75 blade, a paracentesis port was made at the surgeon's 2 o'clock position.  The anterior chamber was then filled with a 1% nonpreserved lidocaine solution with epinephrine.  This was followed by Viscoat to deepen the chamber.  A small fornix-based peritomy was performed superiorly.  Next, a single iris hook was placed through the limbus superiorly.  A 2.4-mm keratome blade was then used to make a clear corneal incision over the iris hook. A bent cystotome needle and Utrata forceps were used to create a continuous tear capsulotomy.  Hydrodissection was performed using balanced salt solution on a fine cannula.  The lens nucleus was then removed using phacoemulsification in a quadrant cracking technique.  The cortical material was then removed with irrigation and aspiration.  The capsular bag and anterior chamber were refilled with Provisc.  The wound was widened to approximately 3 mm and a posterior chamber intraocular lens was placed into the  capsular bag without difficulty using an [employer] lens injecting system.  A single 10-0 nylon suture was then used to close the incision as well as stromal hydration.  The Provisc was removed from the anterior chamber and capsular bag with irrigation and aspiration.  At this point, the wounds were tested for leak, which were negative.  The anterior chamber remained deep and stable.  The patient tolerated the procedure well.  There were no operative complications, and he awoke from topical anesthesia and IV sedation without problem.  No surgical specimens.  Prosthetic device used is a Theme park manager, model EnVista, model number MX60, power of 30.0, serial number is [id number].          ______________________________ Susanne Greenhouse, MD     KEH/MEDQ  D:  05/15/2012  T:  05/15/2012  Job:  [id number]

## 2012-05-16 ENCOUNTER — Encounter (HOSPITAL_COMMUNITY): Payer: Self-pay | Admitting: Ophthalmology

## 2012-06-30 ENCOUNTER — Other Ambulatory Visit (HOSPITAL_COMMUNITY): Payer: Medicare Other

## 2012-06-30 ENCOUNTER — Encounter (HOSPITAL_COMMUNITY): Payer: Medicare Other | Attending: Oncology

## 2012-06-30 DIAGNOSIS — Z452 Encounter for adjustment and management of vascular access device: Secondary | ICD-10-CM

## 2012-06-30 DIAGNOSIS — R7401 Elevation of levels of liver transaminase levels: Secondary | ICD-10-CM | POA: Insufficient documentation

## 2012-06-30 DIAGNOSIS — C9110 Chronic lymphocytic leukemia of B-cell type not having achieved remission: Secondary | ICD-10-CM | POA: Insufficient documentation

## 2012-06-30 DIAGNOSIS — Z9889 Other specified postprocedural states: Secondary | ICD-10-CM | POA: Insufficient documentation

## 2012-06-30 DIAGNOSIS — Z95828 Presence of other vascular implants and grafts: Secondary | ICD-10-CM

## 2012-06-30 DIAGNOSIS — R7402 Elevation of levels of lactic acid dehydrogenase (LDH): Secondary | ICD-10-CM | POA: Insufficient documentation

## 2012-06-30 LAB — COMPREHENSIVE METABOLIC PANEL
Albumin: 3.8 g/dL (ref 3.5–5.2)
Alkaline Phosphatase: 63 U/L (ref 39–117)
BUN: 17 mg/dL (ref 6–23)
Calcium: 8.8 mg/dL (ref 8.4–10.5)
Creatinine, Ser: 1.31 mg/dL (ref 0.50–1.35)
Potassium: 4.2 mEq/L (ref 3.5–5.1)
Total Protein: 7.7 g/dL (ref 6.0–8.3)

## 2012-06-30 LAB — CBC WITH DIFFERENTIAL/PLATELET
Basophils Relative: 0 % (ref 0–1)
Eosinophils Absolute: 0.5 10*3/uL (ref 0.0–0.7)
Hemoglobin: 12.6 g/dL — ABNORMAL LOW (ref 13.0–17.0)
MCH: 35.8 pg — ABNORMAL HIGH (ref 26.0–34.0)
MCHC: 34 g/dL (ref 30.0–36.0)
Monocytes Relative: 11 % (ref 3–12)
Neutrophils Relative %: 23 % — ABNORMAL LOW (ref 43–77)

## 2012-06-30 LAB — LACTATE DEHYDROGENASE: LDH: 168 U/L (ref 94–250)

## 2012-06-30 MED ORDER — HEPARIN SOD (PORK) LOCK FLUSH 100 UNIT/ML IV SOLN
INTRAVENOUS | Status: AC
  Filled 2012-06-30: qty 5

## 2012-06-30 MED ORDER — HEPARIN SOD (PORK) LOCK FLUSH 100 UNIT/ML IV SOLN
500.0000 [IU] | Freq: Once | INTRAVENOUS | Status: AC
  Administered 2012-06-30: 500 [IU] via INTRAVENOUS
  Filled 2012-06-30: qty 5

## 2012-06-30 MED ORDER — SODIUM CHLORIDE 0.9 % IJ SOLN
10.0000 mL | INTRAMUSCULAR | Status: DC | PRN
  Administered 2012-06-30: 10 mL via INTRAVENOUS
  Filled 2012-06-30: qty 10

## 2012-06-30 NOTE — Progress Notes (Signed)
Labs drawn today for cbc/diff,cmp,ldh 

## 2012-06-30 NOTE — Progress Notes (Signed)
Silvano Rusk presented for Portacath access and flush. Proper placement of portacath confirmed by CXR. Portacath located right  chest wall accessed with  H 20 needle. No blood return  Portacath flushed with 20ml NS and 500U/6ml Heparin and needle removed intact. Procedure without incident. Patient tolerated procedure well.

## 2012-06-30 NOTE — Addendum Note (Signed)
Addended byLeida Lauth on: 06/30/2012 09:43 AM   Modules accepted: Orders

## 2012-07-02 ENCOUNTER — Encounter (HOSPITAL_COMMUNITY): Payer: Medicare Other | Admitting: Oncology

## 2012-07-02 NOTE — Progress Notes (Signed)
Patient 25 minutes late for appointment, thus rescheduled

## 2012-07-07 ENCOUNTER — Encounter (HOSPITAL_COMMUNITY): Payer: Self-pay | Admitting: Oncology

## 2012-07-07 ENCOUNTER — Encounter (HOSPITAL_BASED_OUTPATIENT_CLINIC_OR_DEPARTMENT_OTHER): Payer: Medicare Other | Admitting: Oncology

## 2012-07-07 VITALS — BP 115/73 | HR 73 | Temp 98.0°F | Resp 16 | Wt 152.6 lb

## 2012-07-07 DIAGNOSIS — C9110 Chronic lymphocytic leukemia of B-cell type not having achieved remission: Secondary | ICD-10-CM

## 2012-07-07 DIAGNOSIS — R748 Abnormal levels of other serum enzymes: Secondary | ICD-10-CM

## 2012-07-07 NOTE — Patient Instructions (Addendum)
[facility], [facility] Discharge Instructions  RECOMMENDATIONS MADE BY THE CONSULTANT AND ANY TEST RESULTS WILL BE SENT TO YOUR REFERRING PHYSICIAN.  EXAM FINDINGS BY THE PHYSICIAN TODAY AND SIGNS OR SYMPTOMS TO REPORT TO CLINIC OR PRIMARY PHYSICIAN: exam and discussion by PA.  Your liver enzymes are little elevated and we need to do a scan to see what is going on.  MEDICATIONS PRESCRIBED:  none  INSTRUCTIONS GIVEN AND DISCUSSED: CT Prep given and discussed.  SPECIAL INSTRUCTIONS/FOLLOW-UP: Follow-up in 3 months with labs and to see MD.  Thank you for choosing [name] to provide your oncology and hematology care.  To afford each patient quality time with our providers, please arrive at least 15 minutes before your scheduled appointment time.  With your help, our goal is to use those 15 minutes to complete the necessary work-up to ensure our physicians have the information they need to help with your evaluation and healthcare recommendations.    Effective January 1st, 2014, we ask that you re-schedule your appointment with our physicians should you arrive 10 or more minutes late for your appointment.  We strive to give you quality time with our providers, and arriving late affects you and other patients whose appointments are after yours.    Again, thank you for choosing [facility].  Our hope is that these requests will decrease the amount of time that you wait before being seen by our physicians.       _____________________________________________________________  Should you have questions after your visit to [facility], please contact our office at [phone] between the hours of 8:30 a.m. and 5:00 p.m.  Voicemails left after 4:30 p.m. will not be returned until the following business day.  For prescription refill requests, have your pharmacy contact our office with your prescription refill request.

## 2012-07-07 NOTE — Progress Notes (Signed)
Randall Reichert, MD [address] [place] [address]  CLL (chronic lymphocytic leukemia) - Plan: CBC with Differential, Comprehensive metabolic panel, Lactate dehydrogenase, Sedimentation rate, Beta 2 microglobuline, serum, CT Abdomen Pelvis W Contrast  Elevated liver enzymes - Plan: CBC with Differential, Comprehensive metabolic panel, Lactate dehydrogenase, Sedimentation rate, Beta 2 microglobuline, serum, CT Abdomen Pelvis W Contrast  CURRENT THERAPY: Observation, surveillance  INTERVAL HISTORY: Randall Reyes 77 y.o. male returns for  regular  visit for followup of CLL presenting in October 2006 with Stage 0 disease. Randall Reyes presented with stage 0 disease in October 2006 but by the end of June 2010 his white count risen to 162,500 and eventually as high as 190,000. His beta-2 microglobulin level was up to 8.06 his platelets remain good but he became hypogammaglobulinemic. He had developed diffuse lymphadenopathy and I treated him with fludarabine Cytoxan and Rituxan for 3 cycles before he developed significant pancytopenia which prompted the halt in chemotherapy. His counts have recovered nicely however and he remains asymptomatic.  Randall Reyes is doing well.  His wife is getting over a a cold.  He reports that she is starting to feel better because "you know, when they start fussin', they are feeling better."    I personally reviewed and went over laboratory results with the patient.  His labs are remarkable for minimal, yet stable anemia with a normal WBC count, and mild thrombocytopenia.  His CMET is remarkable for ALT and AST elevation.   He is an obese gentleman weighing 152 lbs on a 5'1" frame.  He has not gained any significant weight to explain his elevated liver enzymes, but he is at risk for fatty infiltration of the liver with his truncal obesity.  He denies any b symptoms today.  As a result of his elevated liver enzymes, we will perform a CT abd/pelvis to evaluate for liver  disease and also to evaluate for lymphadenopathy.  Hematologically, Randall Reyes denies any complaints and ROS questioning is negative.     Past Medical History  Diagnosis Date  . Diabetes mellitus   . Leukemia 6.22.2012    PER PATIENT  . Hypertension   . Ulcer   . Anemia   . CLL (chronic lymphocytic leukemia) 01/24/2011  . Melanoma in situ 01/24/2011  . DM (diabetes mellitus) 01/24/2011  . Mild obesity 01/24/2011  . Eczema 01/24/2011  . Port catheter in place 05/02/2012    has CLL (chronic lymphocytic leukemia); Melanoma in situ; DM (diabetes mellitus); Mild obesity; Eczema; and Port catheter in place on his problem list.     is allergic to aspirin and ibuprofen.  Randall Reyes does not currently have medications on file.  Past Surgical History  Procedure Laterality Date  . Hernia repair  2001  . Bleeding ulcer    . Portacath placement  2008  . Cataract extraction w/phaco  05/05/2012    Procedure: CATARACT EXTRACTION PHACO AND INTRAOCULAR LENS PLACEMENT (IOC);  Surgeon: Gemma Payor, MD;  Location: [facility];  Service: Ophthalmology;  Laterality: Right;  CDE:13.25  . Cataract extraction w/phaco  05/15/2012    Procedure: CATARACT EXTRACTION PHACO AND INTRAOCULAR LENS PLACEMENT (IOC);  Surgeon: Gemma Payor, MD;  Location: [facility];  Service: Ophthalmology;  Laterality: Left;  CDE:  12.32    Denies any headaches, dizziness, double vision, fevers, chills, night sweats, nausea, vomiting, diarrhea, constipation, chest pain, heart palpitations, shortness of breath, blood in stool, black tarry stool, urinary pain, urinary burning, urinary frequency, hematuria.   PHYSICAL EXAMINATION  ECOG  PERFORMANCE STATUS: 1 - Symptomatic but completely ambulatory  Filed Vitals:   07/07/12 1425  BP: 115/73  Pulse: 73  Temp: 98 F (36.7 C)  Resp: 16    GENERAL:alert, no distress, well nourished, well developed, comfortable, cooperative, truncal obese and smiling  SKIN: skin color, texture, turgor are normal,  no rashes or significant lesions  HEAD: Normocephalic, No masses, lesions, tenderness or abnormalities  EYES: normal, Conjunctiva are pink and non-injected  EARS: External ears normal  OROPHARYNX:lips, buccal mucosa, and tongue normal and mucous membranes are moist  NECK: supple, no adenopathy, no bruits, thyroid normal size, non-tender, without nodularity, no stridor, non-tender, trachea midline  LYMPH: no palpable lymphadenopathy, no hepatosplenomegaly  BREAST:not examined  LUNGS: clear to auscultation and percussion  HEART: regular rate & rhythm, no murmurs, no gallops, S1 normal and S2 normal  ABDOMEN:abdomen soft, non-tender, obese, normal bowel sounds, no masses or organomegaly and no hepatosplenomegaly, but difficult to assess due to body habitus.  BACK: Back symmetric, no curvature., No CVA tenderness  EXTREMITIES:less then 2 second capillary refill, no joint deformities, effusion, or inflammation, no skin discoloration, no clubbing, no cyanosis, positive findings: edema B/L LE nonpitting edema.  NEURO: alert & oriented x 3 with fluent speech, no focal motor/sensory deficits, gait normal    LABORATORY DATA: CBC    Component Value Date/Time   WBC 8.3 06/30/2012 0936   RBC 3.52* 06/30/2012 0936   HGB 12.6* 06/30/2012 0936   HCT 37.1* 06/30/2012 0936   PLT 126* 06/30/2012 0936   MCV 105.4* 06/30/2012 0936   MCH 35.8* 06/30/2012 0936   MCHC 34.0 06/30/2012 0936   RDW 13.8 06/30/2012 0936   LYMPHSABS 5.0* 06/30/2012 0936   MONOABS 0.9 06/30/2012 0936   EOSABS 0.5 06/30/2012 0936   BASOSABS 0.0 06/30/2012 0936     Chemistry      Component Value Date/Time   NA 140 06/30/2012 0936   K 4.2 06/30/2012 0936   CL 105 06/30/2012 0936   CO2 28 06/30/2012 0936   BUN 17 06/30/2012 0936   CREATININE 1.31 06/30/2012 0936      Component Value Date/Time   CALCIUM 8.8 06/30/2012 0936   ALKPHOS 63 06/30/2012 0936   AST 49* 06/30/2012 0936   ALT 61* 06/30/2012 0936   BILITOT 0.2* 06/30/2012 0936      Results for Randall, Reyes (MRN [id number]) as of 07/07/2012 14:14  Ref. Range 06/30/2012 09:36  LDH Latest Range: 94-250 U/L 168      ASSESSMENT:  1. CLL, stage 0, presenting in October 2006 but by the end of June 2010 his white count risen to 162,500 and eventually as high as 190,000. His beta-2 microglobulin level was up to 8.06 his platelets remain good but he became hypogammaglobulinemic. He had developed diffuse lymphadenopathy and he was treated with fludarabine Cytoxan and Rituxan for 3 cycles before he developed significant pancytopenia which prompted the halt in chemotherapy. His counts have recovered nicely and he remains asymptomatic and in a remission.  2. Mild obesity  3. DM   PLAN:  1. I personally reviewed and went over laboratory results with the patient. 2. Labs in 3 months: CBC diff, CMET, LDH, ESR, B-2 Microglobulin 3. CT abd/pelvis with contrast to evaluate for liver disease and adenopathy from CLL.  4. Labs today: Hepatitis panel. 5. Return in 3 months for follow-up.   All questions were answered. The patient knows to call the clinic with any problems, questions or concerns. We can certainly see  the patient much sooner if necessary.  The patient and plan discussed with Glenford Peers, MD and he is in agreement with the aforementioned.  KEFALAS,THOMAS

## 2012-07-07 NOTE — Progress Notes (Signed)
Randall Reyes presented for labwork. Labs per MD order drawn via Peripheral Line 23 gauge needle inserted in right AC  Good blood return present. Procedure without incident.  Needle removed intact. Patient tolerated procedure well.   

## 2012-07-08 ENCOUNTER — Other Ambulatory Visit (HOSPITAL_COMMUNITY): Payer: Self-pay | Admitting: Oncology

## 2012-07-08 DIAGNOSIS — R748 Abnormal levels of other serum enzymes: Secondary | ICD-10-CM

## 2012-07-08 LAB — HEPATITIS PANEL, ACUTE
HCV Ab: REACTIVE — AB
Hep A IgM: NEGATIVE
Hep B C IgM: NEGATIVE
Hepatitis B Surface Ag: NEGATIVE

## 2012-07-09 ENCOUNTER — Encounter (HOSPITAL_BASED_OUTPATIENT_CLINIC_OR_DEPARTMENT_OTHER): Payer: Medicare Other

## 2012-07-09 NOTE — Progress Notes (Signed)
Labs drawn today for HCV RNA quant reflex

## 2012-07-28 ENCOUNTER — Ambulatory Visit (HOSPITAL_COMMUNITY)
Admission: RE | Admit: 2012-07-28 | Discharge: 2012-07-28 | Disposition: A | Discharge status: Home / Self Care | Payer: Medicare Other | Source: Ambulatory Visit | Attending: Oncology | Admitting: Oncology

## 2012-07-28 DIAGNOSIS — R599 Enlarged lymph nodes, unspecified: Secondary | ICD-10-CM | POA: Insufficient documentation

## 2012-07-28 DIAGNOSIS — C9110 Chronic lymphocytic leukemia of B-cell type not having achieved remission: Secondary | ICD-10-CM | POA: Insufficient documentation

## 2012-07-28 DIAGNOSIS — M949 Disorder of cartilage, unspecified: Secondary | ICD-10-CM | POA: Insufficient documentation

## 2012-07-28 DIAGNOSIS — R748 Abnormal levels of other serum enzymes: Secondary | ICD-10-CM | POA: Insufficient documentation

## 2012-07-28 DIAGNOSIS — M899 Disorder of bone, unspecified: Secondary | ICD-10-CM | POA: Insufficient documentation

## 2012-07-28 MED ORDER — IOHEXOL 300 MG/ML  SOLN
100.0000 mL | Freq: Once | INTRAMUSCULAR | Status: AC | PRN
  Administered 2012-07-28: 100 mL via INTRAVENOUS

## 2012-08-11 ENCOUNTER — Encounter (HOSPITAL_COMMUNITY): Payer: Medicare Other | Attending: Oncology

## 2012-08-11 DIAGNOSIS — Z9889 Other specified postprocedural states: Secondary | ICD-10-CM | POA: Insufficient documentation

## 2012-08-11 DIAGNOSIS — C9110 Chronic lymphocytic leukemia of B-cell type not having achieved remission: Secondary | ICD-10-CM

## 2012-08-11 DIAGNOSIS — Z452 Encounter for adjustment and management of vascular access device: Secondary | ICD-10-CM

## 2012-08-11 DIAGNOSIS — Z95828 Presence of other vascular implants and grafts: Secondary | ICD-10-CM

## 2012-08-11 MED ORDER — HEPARIN SOD (PORK) LOCK FLUSH 100 UNIT/ML IV SOLN
500.0000 [IU] | Freq: Once | INTRAVENOUS | Status: AC
  Administered 2012-08-11: 500 [IU] via INTRAVENOUS
  Filled 2012-08-11: qty 5

## 2012-08-11 MED ORDER — SODIUM CHLORIDE 0.9 % IJ SOLN
10.0000 mL | INTRAMUSCULAR | Status: DC | PRN
  Administered 2012-08-11: 10 mL via INTRAVENOUS
  Filled 2012-08-11: qty 10

## 2012-08-11 MED ORDER — HEPARIN SOD (PORK) LOCK FLUSH 100 UNIT/ML IV SOLN
INTRAVENOUS | Status: AC
  Filled 2012-08-11: qty 5

## 2012-08-11 NOTE — Progress Notes (Signed)
Randall Reyes presented for Portacath access and flush. Proper placement of portacath confirmed by CXR. Portacath located right chest wall accessed with  H 20 needle. No blood return and flushes easily without pain or discomfort. Pt reports port does not give blood return. Portacath flushed with 20ml NS and 500U/51ml Heparin and needle removed intact. Procedure without incident. Patient tolerated procedure well.

## 2012-09-23 ENCOUNTER — Encounter (HOSPITAL_COMMUNITY): Payer: Medicare Other | Attending: Oncology

## 2012-09-23 DIAGNOSIS — R748 Abnormal levels of other serum enzymes: Secondary | ICD-10-CM

## 2012-09-23 DIAGNOSIS — C9110 Chronic lymphocytic leukemia of B-cell type not having achieved remission: Secondary | ICD-10-CM

## 2012-09-23 LAB — CBC WITH DIFFERENTIAL/PLATELET
Basophils Absolute: 0 10*3/uL (ref 0.0–0.1)
Lymphs Abs: 6.4 10*3/uL — ABNORMAL HIGH (ref 0.7–4.0)
MCH: 35.3 pg — ABNORMAL HIGH (ref 26.0–34.0)
MCHC: 34 g/dL (ref 30.0–36.0)
MCV: 103.9 fL — ABNORMAL HIGH (ref 78.0–100.0)
Monocytes Absolute: 1.7 10*3/uL — ABNORMAL HIGH (ref 0.1–1.0)
Neutro Abs: 2.2 10*3/uL (ref 1.7–7.7)
Platelets: 141 10*3/uL — ABNORMAL LOW (ref 150–400)
RDW: 14.6 % (ref 11.5–15.5)

## 2012-09-23 LAB — COMPREHENSIVE METABOLIC PANEL
Albumin: 3.7 g/dL (ref 3.5–5.2)
BUN: 16 mg/dL (ref 6–23)
Creatinine, Ser: 1.2 mg/dL (ref 0.50–1.35)
GFR calc Af Amer: 66 mL/min — ABNORMAL LOW (ref >90)
Glucose, Bld: 104 mg/dL — ABNORMAL HIGH (ref 70–99)
Total Bilirubin: 0.2 mg/dL — ABNORMAL LOW (ref 0.3–1.2)
Total Protein: 8 g/dL (ref 6.0–8.3)

## 2012-09-23 LAB — LACTATE DEHYDROGENASE: LDH: 193 U/L (ref 94–250)

## 2012-09-23 LAB — SEDIMENTATION RATE: Sed Rate: >140 mm/hr — ABNORMAL HIGH (ref 0–16)

## 2012-09-23 MED ORDER — SODIUM CHLORIDE 0.9 % IJ SOLN
20.0000 mL | INTRAMUSCULAR | Status: DC | PRN
  Administered 2012-09-23: 20 mL via INTRAVENOUS
  Filled 2012-09-23: qty 20

## 2012-09-23 MED ORDER — HEPARIN SOD (PORK) LOCK FLUSH 100 UNIT/ML IV SOLN
INTRAVENOUS | Status: AC
  Filled 2012-09-23: qty 5

## 2012-09-23 MED ORDER — HEPARIN SOD (PORK) LOCK FLUSH 100 UNIT/ML IV SOLN
500.0000 [IU] | Freq: Once | INTRAVENOUS | Status: AC
  Administered 2012-09-23: 500 [IU] via INTRAVENOUS
  Filled 2012-09-23: qty 5

## 2012-09-23 NOTE — Progress Notes (Signed)
Randall Reyes presented for Portacath access and flush. Proper placement of portacath confirmed by CXR. Portacath located right chest wall accessed with  H 20 needle. No blood return and flushes well without any pain or discomfort. Portacath flushed with 20ml NS and 500U/7ml Heparin and needle removed intact. Procedure without incident. Patient tolerated procedure well.  Randall Reyes presented for labwork. Labs per MD order drawn via Peripheral Line 25 gauge needle inserted in RAC.  Procedure without incident.  Patient tolerated procedure well.

## 2012-09-24 ENCOUNTER — Other Ambulatory Visit (HOSPITAL_COMMUNITY): Payer: Self-pay | Admitting: Oncology

## 2012-09-24 DIAGNOSIS — C9110 Chronic lymphocytic leukemia of B-cell type not having achieved remission: Secondary | ICD-10-CM

## 2012-09-30 ENCOUNTER — Ambulatory Visit (HOSPITAL_COMMUNITY): Payer: Medicare Other | Admitting: Oncology

## 2012-10-01 ENCOUNTER — Encounter (HOSPITAL_BASED_OUTPATIENT_CLINIC_OR_DEPARTMENT_OTHER): Payer: Medicare Other

## 2012-10-01 DIAGNOSIS — C9110 Chronic lymphocytic leukemia of B-cell type not having achieved remission: Secondary | ICD-10-CM

## 2012-10-01 LAB — CBC WITH DIFFERENTIAL/PLATELET
Basophils Absolute: 0 K/uL (ref 0.0–0.1)
Basophils Relative: 0 % (ref 0–1)
Eosinophils Absolute: 0.4 K/uL (ref 0.0–0.7)
Eosinophils Relative: 4 % (ref 0–5)
HCT: 36.2 % — ABNORMAL LOW (ref 39.0–52.0)
Hemoglobin: 12 g/dL — ABNORMAL LOW (ref 13.0–17.0)
Lymphocytes Relative: 62 % — ABNORMAL HIGH (ref 12–46)
Lymphs Abs: 6 K/uL — ABNORMAL HIGH (ref 0.7–4.0)
MCH: 35.3 pg — ABNORMAL HIGH (ref 26.0–34.0)
MCHC: 33.1 g/dL (ref 30.0–36.0)
MCV: 106.5 fL — ABNORMAL HIGH (ref 78.0–100.0)
Monocytes Absolute: 1.5 K/uL — ABNORMAL HIGH (ref 0.1–1.0)
Monocytes Relative: 15 % — ABNORMAL HIGH (ref 3–12)
Neutro Abs: 1.9 K/uL (ref 1.7–7.7)
Neutrophils Relative %: 19 % — ABNORMAL LOW (ref 43–77)
Platelets: 131 K/uL — ABNORMAL LOW (ref 150–400)
RBC: 3.4 MIL/uL — ABNORMAL LOW (ref 4.22–5.81)
RDW: 14.9 % (ref 11.5–15.5)
WBC: 9.8 K/uL (ref 4.0–10.5)

## 2012-10-01 LAB — COMPREHENSIVE METABOLIC PANEL WITH GFR
ALT: 23 U/L (ref 0–53)
AST: 23 U/L (ref 0–37)
Albumin: 3.7 g/dL (ref 3.5–5.2)
Alkaline Phosphatase: 63 U/L (ref 39–117)
BUN: 19 mg/dL (ref 6–23)
CO2: 28 meq/L (ref 19–32)
Calcium: 9 mg/dL (ref 8.4–10.5)
Chloride: 105 meq/L (ref 96–112)
Creatinine, Ser: 1.35 mg/dL (ref 0.50–1.35)
GFR calc Af Amer: 58 mL/min — ABNORMAL LOW
GFR calc non Af Amer: 50 mL/min — ABNORMAL LOW
Glucose, Bld: 121 mg/dL — ABNORMAL HIGH (ref 70–99)
Potassium: 4.1 meq/L (ref 3.5–5.1)
Sodium: 141 meq/L (ref 135–145)
Total Bilirubin: 0.2 mg/dL — ABNORMAL LOW (ref 0.3–1.2)
Total Protein: 8 g/dL (ref 6.0–8.3)

## 2012-10-01 LAB — SEDIMENTATION RATE: Sed Rate: 54 mm/hr — ABNORMAL HIGH (ref 0–16)

## 2012-10-01 NOTE — Progress Notes (Signed)
Labs drawn today for cbc/diff,cmp,ldh,sed rate 

## 2012-10-07 ENCOUNTER — Encounter (HOSPITAL_BASED_OUTPATIENT_CLINIC_OR_DEPARTMENT_OTHER): Payer: Medicare Other | Admitting: Oncology

## 2012-10-07 VITALS — BP 120/72 | HR 71 | Temp 97.7°F | Resp 18 | Wt 145.2 lb

## 2012-10-07 DIAGNOSIS — R599 Enlarged lymph nodes, unspecified: Secondary | ICD-10-CM

## 2012-10-07 DIAGNOSIS — C9110 Chronic lymphocytic leukemia of B-cell type not having achieved remission: Secondary | ICD-10-CM

## 2012-10-07 DIAGNOSIS — R7989 Other specified abnormal findings of blood chemistry: Secondary | ICD-10-CM

## 2012-10-07 NOTE — Patient Instructions (Signed)
.  [facility] Discharge Instructions  RECOMMENDATIONS MADE BY THE CONSULTANT AND ANY TEST RESULTS WILL BE SENT TO YOUR REFERRING PHYSICIAN.  EXAM FINDINGS BY THE PHYSICIAN TODAY AND SIGNS OR SYMPTOMS TO REPORT TO CLINIC OR PRIMARY PHYSICIAN:   No need for treatment at this time SPECIAL INSTRUCTIONS/FOLLOW-UP: Labs in 3 months then to see Randall Reyes  Thank you for choosing [name] to provide your oncology and hematology care.  To afford each patient quality time with our providers, please arrive at least 15 minutes before your scheduled appointment time.  With your help, our goal is to use those 15 minutes to complete the necessary work-up to ensure our physicians have the information they need to help with your evaluation and healthcare recommendations.    Effective January 1st, 2014, we ask that you re-schedule your appointment with our physicians should you arrive 10 or more minutes late for your appointment.  We strive to give you quality time with our providers, and arriving late affects you and other patients whose appointments are after yours.    Again, thank you for choosing [facility].  Our hope is that these requests will decrease the amount of time that you wait before being seen by our physicians.       _____________________________________________________________  Should you have questions after your visit to [facility], please contact our office at [phone] between the hours of 8:30 a.m. and 5:00 p.m.  Voicemails left after 4:30 p.m. will not be returned until the following business day.  For prescription refill requests, have your pharmacy contact our office with your prescription refill request.

## 2012-10-07 NOTE — Progress Notes (Signed)
#  1 CLL presenting with stage 0 disease in October 2006 however by the end of June 2010 his white count risen to 180,000, beta-2 microglobulin level was up to 8.06, hemoglobin has been dropping gradually but steadily and he was significantly hypogammaglobulinemic. His LDH was elevated. His lymphocyte count reached 175,000 and he had developed diffuse lymphadenopathy. He become symptomatic. We treated him with 3 cycles of fludarabine, Cytoxan, and Rituxan. He then developed significant pancytopenia requiring discontinuation of therapy. He is not require therapy since. He has no B. symptomatology. He is not aware of any adenopathy. Appetite is excellent etc.  He did have a high sedimentation rate when his blood was first checked it may but in retrospect after talking to him and his wife he had an acute sinusitis at the time which is now better. He is low postnasal drip cough. He is not producing yellow or Green phlegm. He is not feel bad at this time.  BP 120/72  Pulse 71  Temp(Src) 97.7 F (36.5 C) (Oral)  Resp 18  Wt 145 lb 3.2 oz (65.862 kg)  BMI 27.45 kg/m2  He has one soft lymph node left mid neck 8-9 mm across. It does not feel pathological. He has no other lymphadenopathy. He has no obvious splenomegaly. He is no hepatomegaly. Bowel sounds are normal. Lungs are clear to auscultation percussion. No thyromegaly. Heart shows a regular rhythm and rate without murmur rub or gallop. No leg or arm edema. His Port-A-Cath is intact.  We just need to observe him at this time but he has had a mild rise in his absolute lymphocyte count compared to December 2012. His hemoglobin remained stable as does his platelet count. At some point he may need therapy but has not needed presently. We will see him back in 3 months.

## 2012-11-04 ENCOUNTER — Encounter (HOSPITAL_COMMUNITY): Payer: Medicare Other | Attending: Oncology

## 2012-11-04 DIAGNOSIS — Z452 Encounter for adjustment and management of vascular access device: Secondary | ICD-10-CM

## 2012-11-04 DIAGNOSIS — Z95828 Presence of other vascular implants and grafts: Secondary | ICD-10-CM

## 2012-11-04 DIAGNOSIS — C9110 Chronic lymphocytic leukemia of B-cell type not having achieved remission: Secondary | ICD-10-CM

## 2012-11-04 DIAGNOSIS — Z9889 Other specified postprocedural states: Secondary | ICD-10-CM | POA: Insufficient documentation

## 2012-11-04 MED ORDER — SODIUM CHLORIDE 0.9 % IJ SOLN
10.0000 mL | INTRAMUSCULAR | Status: DC | PRN
  Administered 2012-11-04: 10 mL via INTRAVENOUS
  Filled 2012-11-04: qty 10

## 2012-11-04 MED ORDER — HEPARIN SOD (PORK) LOCK FLUSH 100 UNIT/ML IV SOLN
500.0000 [IU] | Freq: Once | INTRAVENOUS | Status: AC
  Administered 2012-11-04: 500 [IU] via INTRAVENOUS
  Filled 2012-11-04: qty 5

## 2012-11-04 NOTE — Progress Notes (Signed)
Randall Reyes presented for Portacath access and flush. Proper placement of portacath confirmed by CXR. Portacath located  chest wall accessed with  H 20 needle. Good blood return present. Portacath flushed with 20ml NS and 500U/31ml Heparin and needle removed intact. Procedure without incident. Patient tolerated procedure well.

## 2012-12-16 ENCOUNTER — Encounter (HOSPITAL_COMMUNITY): Payer: Medicare Other | Attending: Oncology

## 2012-12-16 DIAGNOSIS — Z452 Encounter for adjustment and management of vascular access device: Secondary | ICD-10-CM

## 2012-12-16 DIAGNOSIS — C9110 Chronic lymphocytic leukemia of B-cell type not having achieved remission: Secondary | ICD-10-CM | POA: Insufficient documentation

## 2012-12-16 LAB — COMPREHENSIVE METABOLIC PANEL
ALT: 30 U/L (ref 0–53)
AST: 28 U/L (ref 0–37)
CO2: 25 mEq/L (ref 19–32)
Calcium: 9.1 mg/dL (ref 8.4–10.5)
Chloride: 103 mEq/L (ref 96–112)
Creatinine, Ser: 1.47 mg/dL — ABNORMAL HIGH (ref 0.50–1.35)
GFR calc Af Amer: 52 mL/min — ABNORMAL LOW (ref >90)
GFR calc non Af Amer: 45 mL/min — ABNORMAL LOW (ref >90)
Glucose, Bld: 115 mg/dL — ABNORMAL HIGH (ref 70–99)
Sodium: 137 mEq/L (ref 135–145)
Total Bilirubin: 0.2 mg/dL — ABNORMAL LOW (ref 0.3–1.2)

## 2012-12-16 LAB — CBC WITH DIFFERENTIAL/PLATELET
Blasts: 0 %
Eosinophils Absolute: 1.1 10*3/uL — ABNORMAL HIGH (ref 0.0–0.7)
Eosinophils Relative: 7 % — ABNORMAL HIGH (ref 0–5)
Monocytes Absolute: 0.6 10*3/uL (ref 0.1–1.0)
Monocytes Relative: 4 % (ref 3–12)
Neutro Abs: 3.5 10*3/uL (ref 1.7–7.7)
Neutrophils Relative %: 23 % — ABNORMAL LOW (ref 43–77)
Platelets: 116 10*3/uL — ABNORMAL LOW (ref 150–400)
RBC: 3.38 MIL/uL — ABNORMAL LOW (ref 4.22–5.81)
RDW: 14.4 % (ref 11.5–15.5)
WBC: 15.2 10*3/uL — ABNORMAL HIGH (ref 4.0–10.5)
nRBC: 0 /100 WBC

## 2012-12-16 MED ORDER — SODIUM CHLORIDE 0.9 % IJ SOLN
10.0000 mL | INTRAMUSCULAR | Status: DC | PRN
  Administered 2012-12-16: 10 mL via INTRAVENOUS
  Filled 2012-12-16: qty 10

## 2012-12-16 MED ORDER — HEPARIN SOD (PORK) LOCK FLUSH 100 UNIT/ML IV SOLN
500.0000 [IU] | Freq: Once | INTRAVENOUS | Status: AC
  Administered 2012-12-16: 500 [IU] via INTRAVENOUS
  Filled 2012-12-16: qty 5

## 2012-12-16 MED ORDER — HEPARIN SOD (PORK) LOCK FLUSH 100 UNIT/ML IV SOLN
INTRAVENOUS | Status: AC
  Filled 2012-12-16: qty 5

## 2012-12-16 NOTE — Progress Notes (Signed)
Randall Reyes presented for Portacath access and flush. Proper placement of portacath confirmed by CXR. Portacath located  chest wall accessed with  H 20 needle. No blood return  Portacath flushed with 20ml NS and 500U/21ml Heparin and needle removed intact. Procedure without incident. Patient tolerated procedure well.

## 2012-12-17 LAB — BETA 2 MICROGLOBULIN, SERUM: Beta-2 Microglobulin: 6.3 mg/L — ABNORMAL HIGH (ref 1.01–1.73)

## 2012-12-31 ENCOUNTER — Encounter (HOSPITAL_COMMUNITY): Payer: Self-pay | Admitting: Oncology

## 2012-12-31 ENCOUNTER — Encounter (HOSPITAL_BASED_OUTPATIENT_CLINIC_OR_DEPARTMENT_OTHER): Payer: Medicare Other | Admitting: Oncology

## 2012-12-31 VITALS — BP 157/90 | HR 62 | Temp 97.7°F | Resp 16 | Wt 147.4 lb

## 2012-12-31 DIAGNOSIS — C9110 Chronic lymphocytic leukemia of B-cell type not having achieved remission: Secondary | ICD-10-CM

## 2012-12-31 NOTE — Patient Instructions (Signed)
.  [facility] Discharge Instructions  RECOMMENDATIONS MADE BY THE CONSULTANT AND ANY TEST RESULTS WILL BE SENT TO YOUR REFERRING PHYSICIAN.  EXAM FINDINGS BY THE PHYSICIAN TODAY AND SIGNS OR SYMPTOMS TO REPORT TO CLINIC OR PRIMARY PHYSICIAN: exam per Jenita Seashore   SPECIAL INSTRUCTIONS/FOLLOW-UP: Labs in 3 months then to see [name]  Thank you for choosing [name] to provide your oncology and hematology care.  To afford each patient quality time with our providers, please arrive at least 15 minutes before your scheduled appointment time.  With your help, our goal is to use those 15 minutes to complete the necessary work-up to ensure our physicians have the information they need to help with your evaluation and healthcare recommendations.    Effective January 1st, 2014, we ask that you re-schedule your appointment with our physicians should you arrive 10 or more minutes late for your appointment.  We strive to give you quality time with our providers, and arriving late affects you and other patients whose appointments are after yours.    Again, thank you for choosing [facility].  Our hope is that these requests will decrease the amount of time that you wait before being seen by our physicians.       _____________________________________________________________  Should you have questions after your visit to [facility], please contact our office at [phone] between the hours of 8:30 a.m. and 5:00 p.m.  Voicemails left after 4:30 p.m. will not be returned until the following business day.  For prescription refill requests, have your pharmacy contact our office with your prescription refill request.

## 2012-12-31 NOTE — Addendum Note (Signed)
Addended by: Ellouise Newer on: 12/31/2012 03:38 PM   Modules accepted: Level of Service

## 2012-12-31 NOTE — Progress Notes (Signed)
[name], MD [address] [place] [address]  CLL (chronic lymphocytic leukemia) - Plan: CBC with Differential, Comprehensive metabolic panel, Lactate dehydrogenase, Sedimentation rate, Beta 2 microglobuline, serum  CURRENT THERAPY: Surveillance  INTERVAL HISTORY: SUM[place] ARROYAVE 77 y.o. male returns for  regular  visit for followup of CLL presenting with stage 0 disease in October 2006 however by the end of June 2010 his white count risen to 180,000, beta-2 microglobulin level was up to 8.06, hemoglobin has been dropping gradually but steadily and he was significantly hypogammaglobulinemic. His LDH was elevated. His lymphocyte count reached 175,000 and he had developed diffuse lymphadenopathy. He become symptomatic. We treated him with 3 cycles of fludarabine, Cytoxan, and Rituxan. He then developed significant pancytopenia requiring discontinuation of therapy. He is not require therapy since.  Rocky Link is doing well.  He is accompanied by his wife.  They have been married for 51 years.  She reports that they met on a blind date and started dating.  They became engaged 1 month later and married 6 months later.  They had 2 children, one is deceased from a MVA.    Hematologically, he denies any complaints and ROS questioning is negative.  He denies any B symptoms and his appetite is strong.  His wife confirms that aforementioned statement.  He denies any early satiety or abdominal fullness or discomfort.    Past Medical History  Diagnosis Date  . Diabetes mellitus   . Leukemia 6.22.2012    PER PATIENT  . Hypertension   . Ulcer   . Anemia   . CLL (chronic lymphocytic leukemia) 01/24/2011  . Melanoma in situ 01/24/2011  . DM (diabetes mellitus) 01/24/2011  . Mild obesity 01/24/2011  . Eczema 01/24/2011  . Port catheter in place 05/02/2012    has CLL (chronic lymphocytic leukemia); Melanoma in situ; DM (diabetes mellitus); Mild obesity; Eczema; and Port catheter in place  on his problem list.     is allergic to aspirin and ibuprofen.  Mr. Heinicke had no medications administered during this visit.  Past Surgical History  Procedure Laterality Date  . Hernia repair  2001  . Bleeding ulcer    . Portacath placement  2008  . Cataract extraction w/phaco  05/05/2012    Procedure: CATARACT EXTRACTION PHACO AND INTRAOCULAR LENS PLACEMENT (IOC);  Surgeon: Gemma Payor, MD;  Location: [facility];  Service: Ophthalmology;  Laterality: Right;  CDE:13.25  . Cataract extraction w/phaco  05/15/2012    Procedure: CATARACT EXTRACTION PHACO AND INTRAOCULAR LENS PLACEMENT (IOC);  Surgeon: Gemma Payor, MD;  Location: [facility];  Service: Ophthalmology;  Laterality: Left;  CDE:  12.32    Denies any headaches, dizziness, double vision, fevers, chills, night sweats, nausea, vomiting, diarrhea, constipation, chest pain, heart palpitations, shortness of breath, blood in stool, black tarry stool, urinary pain, urinary burning, urinary frequency, hematuria.   PHYSICAL EXAMINATION  ECOG PERFORMANCE STATUS: 0 - Asymptomatic  Filed Vitals:   12/31/12 1343  BP: 157/90  Pulse: 62  Temp: 97.7 F (36.5 C)  Resp: 16    GENERAL:alert, no distress, well nourished, well developed, comfortable, cooperative, obese and smiling SKIN: skin color, texture, turgor are normal, no rashes or significant lesions HEAD: Normocephalic, No masses, lesions, tenderness or abnormalities EYES: normal, PERRLA, EOMI, Conjunctiva are pink and non-injected EARS: External ears normal OROPHARYNX:mucous membranes are moist  NECK: supple, thyroid normal size, non-tender, without nodularity, no stridor, non-tender, trachea midline, left sided, mid-anterior cervical chain lymph node measuring  8 mm in size that is mobile and soft. LYMPH:  no palpable lymphadenopathy, no hepatosplenomegaly BREAST:not examined LUNGS: clear to auscultation and percussion HEART: regular rate & rhythm, no murmurs, no gallops, S1 normal and S2  normal ABDOMEN:abdomen soft, non-tender, obese, normal bowel sounds, no masses or organomegaly and no hepatosplenomegaly BACK: Back symmetric, no curvature., No CVA tenderness EXTREMITIES:less then 2 second capillary refill, no joint deformities, effusion, or inflammation, no edema, no skin discoloration, no clubbing, no cyanosis  NEURO: alert & oriented x 3 with fluent speech, no focal motor/sensory deficits, gait normal    ASSESSMENT:  1. CLL presenting with stage 0 disease in October 2006 however by the end of June 2010 his white count risen to 180,000, beta-2 microglobulin level was up to 8.06, hemoglobin has been dropping gradually but steadily and he was significantly hypogammaglobulinemic. His LDH was elevated. His lymphocyte count reached 175,000 and he had developed diffuse lymphadenopathy. He become symptomatic. We treated him with 3 cycles of fludarabine, Cytoxan, and Rituxan. He then developed significant pancytopenia requiring discontinuation of therapy. He is not require therapy since.   Patient Active Problem List   Diagnosis Date Noted  . Port catheter in place 05/02/2012  . CLL (chronic lymphocytic leukemia) 01/24/2011  . Melanoma in situ 01/24/2011  . DM (diabetes mellitus) 01/24/2011  . Mild obesity 01/24/2011  . Eczema 01/24/2011     PLAN:  1. I personally reviewed and went over laboratory results with the patient. 2. Labs in 3 months: CBC diff, CMET, ESR, LDH, B2 MG. 3. Patient education regarding his CLL 4. Return in 3 months for follow-up.   THERAPY PLAN:  From a hematologic standpoint, Rocky Link is doing well.  We will continue with lab surveillance and follow NCCN guidelines for consideration of therapy if needed with B symptoms and/or lymphocytosis and/or significant cytopenias.  All questions were answered. The patient knows to call the clinic with any problems, questions or concerns. We can certainly see the patient much sooner if necessary.  Patient and plan  discussed with Dr. Erline Hau and he is in agreement with the aforementioned.   Euclid Cassetta

## 2013-01-27 ENCOUNTER — Encounter (HOSPITAL_COMMUNITY): Payer: Medicare Other | Attending: Oncology

## 2013-01-27 DIAGNOSIS — C9110 Chronic lymphocytic leukemia of B-cell type not having achieved remission: Secondary | ICD-10-CM

## 2013-01-27 DIAGNOSIS — Z452 Encounter for adjustment and management of vascular access device: Secondary | ICD-10-CM

## 2013-01-27 DIAGNOSIS — Z95828 Presence of other vascular implants and grafts: Secondary | ICD-10-CM

## 2013-01-27 DIAGNOSIS — Z9889 Other specified postprocedural states: Secondary | ICD-10-CM | POA: Insufficient documentation

## 2013-01-27 MED ORDER — HEPARIN SOD (PORK) LOCK FLUSH 100 UNIT/ML IV SOLN
INTRAVENOUS | Status: AC
  Filled 2013-01-27: qty 5

## 2013-01-27 MED ORDER — SODIUM CHLORIDE 0.9 % IJ SOLN
10.0000 mL | INTRAMUSCULAR | Status: DC | PRN
  Administered 2013-01-27: 10 mL via INTRAVENOUS
  Filled 2013-01-27: qty 10

## 2013-01-27 MED ORDER — HEPARIN SOD (PORK) LOCK FLUSH 100 UNIT/ML IV SOLN
500.0000 [IU] | Freq: Once | INTRAVENOUS | Status: AC
  Administered 2013-01-27: 500 [IU] via INTRAVENOUS
  Filled 2013-01-27: qty 5

## 2013-01-27 NOTE — Progress Notes (Signed)
Randall Reyes presented for Portacath access and flush. Proper placement of portacath confirmed by CXR. Portacath located right chest wall accessed with  H 20 needle. No blood return present. Portacath flushed with 20ml NS and 500U/23ml Heparin and needle removed intact. Procedure without incident. Patient tolerated procedure well.

## 2013-03-10 ENCOUNTER — Encounter (HOSPITAL_COMMUNITY): Payer: Medicare Other | Attending: Oncology

## 2013-03-10 DIAGNOSIS — Z452 Encounter for adjustment and management of vascular access device: Secondary | ICD-10-CM

## 2013-03-10 DIAGNOSIS — C9110 Chronic lymphocytic leukemia of B-cell type not having achieved remission: Secondary | ICD-10-CM | POA: Insufficient documentation

## 2013-03-10 MED ORDER — SODIUM CHLORIDE 0.9 % IJ SOLN
10.0000 mL | INTRAMUSCULAR | Status: DC | PRN
  Administered 2013-03-10: 10 mL via INTRAVENOUS

## 2013-03-10 MED ORDER — HEPARIN SOD (PORK) LOCK FLUSH 100 UNIT/ML IV SOLN
500.0000 [IU] | Freq: Once | INTRAVENOUS | Status: AC
  Administered 2013-03-10: 500 [IU] via INTRAVENOUS

## 2013-03-10 NOTE — Progress Notes (Signed)
Randall Reyes presented for Portacath access and flu Portacath located right chest wall accessed with  H 20 needle. No blood return Portacath flushed with 20ml NS and 500U/80ml Heparin and needle removed intact. Procedure without incident. Patient tolerated procedure well.

## 2013-04-07 ENCOUNTER — Encounter (HOSPITAL_COMMUNITY): Payer: Medicare Other | Attending: Oncology

## 2013-04-07 DIAGNOSIS — Z95828 Presence of other vascular implants and grafts: Secondary | ICD-10-CM

## 2013-04-07 DIAGNOSIS — Z9889 Other specified postprocedural states: Secondary | ICD-10-CM | POA: Insufficient documentation

## 2013-04-07 DIAGNOSIS — C9110 Chronic lymphocytic leukemia of B-cell type not having achieved remission: Secondary | ICD-10-CM | POA: Insufficient documentation

## 2013-04-07 LAB — CBC WITH DIFFERENTIAL/PLATELET
Band Neutrophils: 0 % (ref 0–10)
Blasts: 0 %
Hemoglobin: 11.9 g/dL — ABNORMAL LOW (ref 13.0–17.0)
MCH: 35.5 pg — ABNORMAL HIGH (ref 26.0–34.0)
MCHC: 32.9 g/dL (ref 30.0–36.0)
MCV: 108.1 fL — ABNORMAL HIGH (ref 78.0–100.0)
Metamyelocytes Relative: 0 %
Monocytes Absolute: 2.1 10*3/uL — ABNORMAL HIGH (ref 0.1–1.0)
Monocytes Relative: 8 % (ref 3–12)
Myelocytes: 0 %
Platelets: 144 10*3/uL — ABNORMAL LOW (ref 150–400)
RBC: 3.35 MIL/uL — ABNORMAL LOW (ref 4.22–5.81)
RDW: 14.7 % (ref 11.5–15.5)
WBC: 25.8 10*3/uL — ABNORMAL HIGH (ref 4.0–10.5)
nRBC: 0 /100 WBC

## 2013-04-07 LAB — COMPREHENSIVE METABOLIC PANEL
ALT: 23 U/L (ref 0–53)
AST: 23 U/L (ref 0–37)
Alkaline Phosphatase: 65 U/L (ref 39–117)
BUN: 19 mg/dL (ref 6–23)
CO2: 23 mEq/L (ref 19–32)
Calcium: 9.4 mg/dL (ref 8.4–10.5)
Creatinine, Ser: 1.58 mg/dL — ABNORMAL HIGH (ref 0.50–1.35)
GFR calc Af Amer: 48 mL/min — ABNORMAL LOW (ref >90)
GFR calc non Af Amer: 41 mL/min — ABNORMAL LOW (ref >90)
Glucose, Bld: 96 mg/dL (ref 70–99)
Potassium: 4 mEq/L (ref 3.5–5.1)
Sodium: 140 mEq/L (ref 135–145)
Total Bilirubin: 0.3 mg/dL (ref 0.3–1.2)
Total Protein: 8.6 g/dL — ABNORMAL HIGH (ref 6.0–8.3)

## 2013-04-07 LAB — SEDIMENTATION RATE: Sed Rate: 60 mm/hr — ABNORMAL HIGH (ref 0–16)

## 2013-04-07 MED ORDER — HEPARIN SOD (PORK) LOCK FLUSH 100 UNIT/ML IV SOLN
500.0000 [IU] | Freq: Once | INTRAVENOUS | Status: AC
  Administered 2013-04-07: 500 [IU] via INTRAVENOUS
  Filled 2013-04-07: qty 5

## 2013-04-07 MED ORDER — SODIUM CHLORIDE 0.9 % IJ SOLN
10.0000 mL | INTRAMUSCULAR | Status: DC | PRN
  Administered 2013-04-07: 10 mL via INTRAVENOUS

## 2013-04-07 NOTE — Progress Notes (Signed)
Randall Reyes presented for Portacath access and flush. Proper placement of portacath confirmed by CXR. Portacath located *rightchest wall accessed with  H 20 needle. No blood return. Portacath flushed with 20ml NS and 500U/75ml Heparin and needle removed intact. Procedure without incident. Patient tolerated procedure well.  Specimen obtained peripherally for labs.  Tolerated well.

## 2013-04-08 ENCOUNTER — Ambulatory Visit (HOSPITAL_COMMUNITY): Payer: Medicare Other | Admitting: Oncology

## 2013-04-08 LAB — BETA 2 MICROGLOBULIN, SERUM: Beta-2 Microglobulin: 7.04 mg/L — ABNORMAL HIGH (ref 1.01–1.73)

## 2013-04-13 NOTE — Progress Notes (Signed)
Randall Reichert, MD [address] [place] [address]  CLL (chronic lymphocytic leukemia) - Plan: CBC with Differential, Comprehensive metabolic panel, Lactate dehydrogenase, Sedimentation rate, Beta 2 microglobuline, serum  CURRENT THERAPY:Surveillance  INTERVAL HISTORY: Randall Reyes 77 y.o. male returns for  regular  visit for followup of CLL presenting with stage 0 disease in October 2006 however by the end of June 2010 his white count risen to 180,000, beta-2 microglobulin level was up to 8.06, hemoglobin has been dropping gradually but steadily and he was significantly hypogammaglobulinemic. His LDH was elevated. His lymphocyte count reached 175,000 and he had developed diffuse lymphadenopathy. He become symptomatic. We treated him with 3 cycles of fludarabine, Cytoxan, and Rituxan. He then developed significant pancytopenia requiring discontinuation of therapy. He is not require therapy since.  I personally reviewed and went over laboratory results with the patient.  His labs from 11/25 show an increasing WBC at 25.8 with lymphocytosis at 16.3, monocytosis at 2.1, eosinophilia at 1.5, and basophilia at 0.5.  B2M is elevated at 7.04.  LDH is slowly rising and is most recently 226.  GFR is declining as well with increasing creatinine at 1.58.  We again reviewed the NCCN guidelines for CLL treatment initiation.  NCCN guidelines for indication for treatment of CLL are:  A. Eligible for clinical trial  B. Significant disease-related symptoms   1. Fatigue (severe)   2. Night sweats   3. Weight loss   4. Fever without infection  C. Threatened end-organ function  D. Progressive bulky disease (spleen >6cm below costal margin, lymph nodes >10 cm)  E. Progressive anemia  F. Progressive thrombocytopenia.   Randall Reyes denies any B symptoms including fatigue, night sweats, weight loss, and fever.  Additionally, he does not have bulky disease.  Anemia and thrombocytopenia is stable.     After reviewing labs, I offered the patient two choices.  Closer surveillance versus Imbruvica therapy.  We discussed the risks, benefits, alternatives, and side effects of therapy including but limited to edema, fatigue, rash, diarrhea, constipation, thrombocytopenia, neutropenia, severe infection.  He understands that this therapy is very well tolerated.  The patient reports that he feels great and declines active therapy at this time which is certainly a reasonable option.  As a result, we will keep a closer eye on his counts and see him back in a few weeks.   Hematologically, he denies any complaints and ROS questioning is negative.   Past Medical History  Diagnosis Date  . Diabetes mellitus   . Leukemia 6.22.2012    PER PATIENT  . Hypertension   . Ulcer   . Anemia   . CLL (chronic lymphocytic leukemia) 01/24/2011  . Melanoma in situ 01/24/2011  . DM (diabetes mellitus) 01/24/2011  . Mild obesity 01/24/2011  . Eczema 01/24/2011  . Port catheter in place 05/02/2012    has CLL (chronic lymphocytic leukemia); Melanoma in situ; DM (diabetes mellitus); Mild obesity; Eczema; and Port catheter in place on his problem list.     is allergic to aspirin and ibuprofen.  Randall Reyes does not currently have medications on file.  Past Surgical History  Procedure Laterality Date  . Hernia repair  2001  . Bleeding ulcer    . Portacath placement  2008  . Cataract extraction w/phaco  05/05/2012    Procedure: CATARACT EXTRACTION PHACO AND INTRAOCULAR LENS PLACEMENT (IOC);  Surgeon: Gemma Payor, MD;  Location: [facility];  Service: Ophthalmology;  Laterality: Right;  CDE:13.25  .  Cataract extraction w/phaco  05/15/2012    Procedure: CATARACT EXTRACTION PHACO AND INTRAOCULAR LENS PLACEMENT (IOC);  Surgeon: Gemma Payor, MD;  Location: [facility];  Service: Ophthalmology;  Laterality: Left;  CDE:  12.32    Denies any headaches, dizziness, double vision, fevers, chills, night sweats, nausea, vomiting, diarrhea,  constipation, chest pain, heart palpitations, shortness of breath, blood in stool, black tarry stool, urinary pain, urinary burning, urinary frequency, hematuria.   PHYSICAL EXAMINATION  ECOG PERFORMANCE STATUS: 0 - Asymptomatic  Filed Vitals:   04/14/13 1423  BP: 146/78  Pulse: 69  Temp: 97.2 F (36.2 C)  Resp: 20    GENERAL:alert, no distress, well nourished, well developed, comfortable, cooperative and smiling SKIN: skin color, texture, turgor are normal, no rashes or significant lesions HEAD: Normocephalic, No masses, lesions, tenderness or abnormalities EYES: normal, PERRLA, EOMI, Conjunctiva are pink and non-injected EARS: External ears normal OROPHARYNX:mucous membranes are moist  NECK: supple, no adenopathy, thyroid normal size, non-tender, without nodularity, no stridor, non-tender, trachea midline LYMPH:  no palpable lymphadenopathy, no hepatosplenomegaly BREAST:not examined LUNGS: clear to auscultation and percussion HEART: regular rate & rhythm, no murmurs, no gallops, S1 normal and S2 normal ABDOMEN:abdomen soft, non-tender, obese, normal bowel sounds, no masses or organomegaly and no hepatosplenomegaly BACK: Back symmetric, no curvature., No CVA tenderness EXTREMITIES:less then 2 second capillary refill, no joint deformities, effusion, or inflammation, no edema, no skin discoloration, no clubbing, no cyanosis  NEURO: alert & oriented x 3 with fluent speech, no focal motor/sensory deficits, gait normal   LABORATORY DATA: CBC    Component Value Date/Time   WBC 25.8* 104/10/2012 1330   RBC 3.35* 104/10/2012 1330   HGB 11.9* 104/10/2012 1330   HCT 36.2* 104/10/2012 1330   PLT 144* 104/10/2012 1330   MCV 108.1* 104/10/2012 1330   MCH 35.5* 104/10/2012 1330   MCHC 32.9 104/10/2012 1330   RDW 14.7 104/10/2012 1330   LYMPHSABS 16.3* 104/10/2012 1330   MONOABS 2.1* 104/10/2012 1330   EOSABS 1.5* 104/10/2012 1330   BASOSABS 0.5* 104/10/2012 1330      Chemistry        Component Value Date/Time   NA 140 104/10/2012 1330   K 4.0 104/10/2012 1330   CL 105 104/10/2012 1330   CO2 23 104/10/2012 1330   BUN 19 104/10/2012 1330   CREATININE 1.58* 104/10/2012 1330      Component Value Date/Time   CALCIUM 9.4 104/10/2012 1330   ALKPHOS 65 104/10/2012 1330   AST 23 104/10/2012 1330   ALT 23 104/10/2012 1330   BILITOT 0.3 104/10/2012 1330     Results for Randall Reyes, Randall Reyes (MRN [id number]) as of 04/13/2013 08:54  Ref. Range 104/10/2012 13:30  Beta-2 Microglobulin Latest Range: 1.01-1.73 mg/L 7.04 (H)   Results for Randall Reyes, Randall Reyes (MRN [id number]) as of 04/13/2013 08:54  Ref. Range 104/10/2012 13:30  LDH Latest Range: 94-250 U/L 226   Results for Randall Reyes, Randall Reyes (MRN [id number]) as of 04/13/2013 08:54  Ref. Range 104/10/2012 13:30  Sed Rate Latest Range: 0-16 mm/hr 60 (H)      ASSESSMENT:  1. CLL presenting with stage 0 disease in October 2006 however by the end of June 2010 his white count risen to 180,000, beta-2 microglobulin level was up to 8.06, hemoglobin has been dropping gradually but steadily and he was significantly hypogammaglobulinemic. His LDH was elevated. His lymphocyte count reached 175,000 and he had developed diffuse lymphadenopathy. He become symptomatic. We treated him with 3 cycles of fludarabine, Cytoxan, and Rituxan.  He then developed significant pancytopenia requiring discontinuation of therapy. He is not require therapy since. His counts have slowly started to rise.  Clinically, he is doing well and he would be a candidate for Imbruvica therapy at any time.  Patient Active Problem List   Diagnosis Date Noted  . Port catheter in place 05/02/2012  . CLL (chronic lymphocytic leukemia) 01/24/2011  . Melanoma in situ 01/24/2011  . DM (diabetes mellitus) 01/24/2011  . Mild obesity 01/24/2011  . Eczema 01/24/2011     PLAN:  1. I personally reviewed and went over laboratory results with the patient. 2. Review of NCCN guidelines  indications for treatment for CLL. 3. Discussed options including close observation versus Imbruvica therapy.  Patient opted for continued observation. 4. Discussed the risks, benefits, alternatives, and side effects of therapy.  5. Labs in 3 weeks and 6 weeks: CBC diff, CMET, ESR, B2M, LDH 6. Return in 6 weeks for follow-up.   THERAPY PLAN:  We reviewed labs and his lab work shows progression of disease.  However, clinically, he is asymptomatic.  He was offered options regarding therapy versus observation.  He has declined therapy and wishes to continue observation.  He is a good candidate for Imbruvica.  All questions were answered. The patient knows to call the clinic with any problems, questions or concerns. We can certainly see the patient much sooner if necessary.  Patient and plan discussed with Dr. Alla German and he is in agreement with the aforementioned.   Nashae Maudlin

## 2013-04-14 ENCOUNTER — Encounter (HOSPITAL_COMMUNITY): Payer: Medicare Other | Attending: Oncology | Admitting: Oncology

## 2013-04-14 VITALS — BP 146/78 | HR 69 | Temp 97.2°F | Resp 20 | Wt 146.4 lb

## 2013-04-14 DIAGNOSIS — C9110 Chronic lymphocytic leukemia of B-cell type not having achieved remission: Secondary | ICD-10-CM | POA: Insufficient documentation

## 2013-04-14 NOTE — Patient Instructions (Signed)
[facility] Discharge Instructions  RECOMMENDATIONS MADE BY THE CONSULTANT AND ANY TEST RESULTS WILL BE SENT TO YOUR REFERRING PHYSICIAN.  MEDICATIONS PRESCRIBED:  None  INSTRUCTIONS GIVEN AND DISCUSSED: Apply Hydrocortisone cream to skin rash  SPECIAL INSTRUCTIONS/FOLLOW-UP: Information regarding Imbruvica  Labs in 3 weeks Labs in 6 weeks Return for follow-up in 6 weeks  Thank you for choosing [name] to provide your oncology and hematology care.  To afford each patient quality time with our providers, please arrive at least 15 minutes before your scheduled appointment time.  With your help, our goal is to use those 15 minutes to complete the necessary work-up to ensure our physicians have the information they need to help with your evaluation and healthcare recommendations.    Effective January 1st, 2014, we ask that you re-schedule your appointment with our physicians should you arrive 10 or more minutes late for your appointment.  We strive to give you quality time with our providers, and arriving late affects you and other patients whose appointments are after yours.    Again, thank you for choosing [facility].  Our hope is that these requests will decrease the amount of time that you wait before being seen by our physicians.       _____________________________________________________________  Should you have questions after your visit to [facility], please contact our office at [phone] between the hours of 8:30 a.m. and 5:00 p.m.  Voicemails left after 4:30 p.m. will not be returned until the following business day.  For prescription refill requests, have your pharmacy contact our office with your prescription refill request.

## 2013-04-21 ENCOUNTER — Other Ambulatory Visit (HOSPITAL_COMMUNITY): Payer: Medicare Other

## 2013-05-19 ENCOUNTER — Encounter (HOSPITAL_COMMUNITY): Payer: Medicare Other | Attending: Oncology

## 2013-05-19 DIAGNOSIS — Z452 Encounter for adjustment and management of vascular access device: Secondary | ICD-10-CM

## 2013-05-19 DIAGNOSIS — C9110 Chronic lymphocytic leukemia of B-cell type not having achieved remission: Secondary | ICD-10-CM | POA: Insufficient documentation

## 2013-05-19 LAB — COMPREHENSIVE METABOLIC PANEL
ALT: 27 U/L (ref 0–53)
AST: 25 U/L (ref 0–37)
Albumin: 3.6 g/dL (ref 3.5–5.2)
Alkaline Phosphatase: 76 U/L (ref 39–117)
BUN: 16 mg/dL (ref 6–23)
CALCIUM: 9 mg/dL (ref 8.4–10.5)
CO2: 24 meq/L (ref 19–32)
CREATININE: 1.42 mg/dL — AB (ref 0.50–1.35)
Chloride: 101 mEq/L (ref 96–112)
GFR calc Af Amer: 54 mL/min — ABNORMAL LOW (ref >90)
GFR, EST NON AFRICAN AMERICAN: 47 mL/min — AB (ref >90)
Glucose, Bld: 158 mg/dL — ABNORMAL HIGH (ref 70–99)
Potassium: 4.1 mEq/L (ref 3.7–5.3)
Sodium: 138 mEq/L (ref 137–147)
Total Bilirubin: 0.3 mg/dL (ref 0.3–1.2)
Total Protein: 8.9 g/dL — ABNORMAL HIGH (ref 6.0–8.3)

## 2013-05-19 LAB — CBC WITH DIFFERENTIAL/PLATELET
BASOS ABS: 0 10*3/uL (ref 0.0–0.1)
BASOS PCT: 0 % (ref 0–1)
Band Neutrophils: 0 % (ref 0–10)
Blasts: 0 %
Eosinophils Absolute: 2.2 10*3/uL — ABNORMAL HIGH (ref 0.0–0.7)
Eosinophils Relative: 6 % — ABNORMAL HIGH (ref 0–5)
HEMATOCRIT: 36.4 % — AB (ref 39.0–52.0)
HEMOGLOBIN: 12.2 g/dL — AB (ref 13.0–17.0)
LYMPHS ABS: 26.5 10*3/uL — AB (ref 0.7–4.0)
LYMPHS PCT: 72 % — AB (ref 12–46)
MCH: 36 pg — ABNORMAL HIGH (ref 26.0–34.0)
MCHC: 33.5 g/dL (ref 30.0–36.0)
MCV: 107.4 fL — ABNORMAL HIGH (ref 78.0–100.0)
MONO ABS: 1.8 10*3/uL — AB (ref 0.1–1.0)
MONOS PCT: 5 % (ref 3–12)
Metamyelocytes Relative: 0 %
Myelocytes: 0 %
NEUTROS ABS: 6.2 10*3/uL (ref 1.7–7.7)
Neutrophils Relative %: 17 % — ABNORMAL LOW (ref 43–77)
Platelets: 151 10*3/uL (ref 150–400)
Promyelocytes Absolute: 0 %
RBC: 3.39 MIL/uL — AB (ref 4.22–5.81)
RDW: 14.8 % (ref 11.5–15.5)
WBC: 36.7 10*3/uL — AB (ref 4.0–10.5)
nRBC: 0 /100 WBC

## 2013-05-19 LAB — SEDIMENTATION RATE: SED RATE: 104 mm/h — AB (ref 0–16)

## 2013-05-19 LAB — LACTATE DEHYDROGENASE: LDH: 237 U/L (ref 94–250)

## 2013-05-19 MED ORDER — HEPARIN SOD (PORK) LOCK FLUSH 100 UNIT/ML IV SOLN
INTRAVENOUS | Status: AC
  Filled 2013-05-19: qty 5

## 2013-05-19 MED ORDER — HEPARIN SOD (PORK) LOCK FLUSH 100 UNIT/ML IV SOLN
500.0000 [IU] | Freq: Once | INTRAVENOUS | Status: AC
  Administered 2013-05-19: 500 [IU] via INTRAVENOUS

## 2013-05-19 MED ORDER — SODIUM CHLORIDE 0.9 % IJ SOLN
10.0000 mL | INTRAMUSCULAR | Status: DC | PRN
Start: 2013-05-19 — End: 2013-05-19
  Administered 2013-05-19: 10 mL via INTRAVENOUS

## 2013-05-19 NOTE — Progress Notes (Signed)
Randall Reyes presented for Portacath access and flush.  Portacath located right chest wall accessed with  H 20 needle.  No blood return - port flushed well with no c/o from pt. Portacath flushed with 26ml NS and 500U/70ml Heparin and needle removed intact.  Procedure tolerated well and without incident.  Randall Reyes presented for labwork.  Labs per MD order drawn via Peripheral Line 23 gauge needle inserted in right antecubital.   Good blood return present. Procedure without incident.  Needle removed intact. Patient tolerated procedure well.

## 2013-05-20 LAB — BETA 2 MICROGLOBULIN, SERUM: BETA 2 MICROGLOBULIN: 6.96 mg/L — AB (ref 1.01–1.73)

## 2013-05-27 NOTE — Progress Notes (Signed)
[name], MD [place] [address]  CLL (chronic lymphocytic leukemia) - Plan: allopurinol (ZYLOPRIM) 300 MG tablet, Uric acid, Uric acid, ibrutinib (IMBRUVICA) 140 MG capsul  CURRENT THERAPY: Surveillance  INTERVAL HISTORY: FUAD FORGET 78 y.o. male returns for  regular  visit for followup of CLL presenting with stage 0 disease in October 2006 however by the end of June 2010 his white count risen to 180,000, beta-2 microglobulin level was up to 8.06, hemoglobin has been dropping gradually but steadily and he was significantly hypogammaglobulinemic. His LDH was elevated. His lymphocyte count reached 175,000 and he had developed diffuse lymphadenopathy. He become symptomatic. We treated him with 3 cycles of fludarabine, Cytoxan, and Rituxan. He then developed significant pancytopenia requiring discontinuation of therapy. He has not require therapy since.  Now with increasing WBC, lymphocyte count, LDH, Beta2-microglobulin level over 6 months.  I personally reviewed and went over laboratory results with the patient.  Lab results follow below, but reviewing his trend since May 2014, it is clear that his CLL is starting to become an issue despite stability of hemoglobin and platelet count.  The longer we wait to treat, in the setting of newer and easily tolerated treatments, the higher the risk for mutations to occur.  A great treatment option for [name] is Imbruvica since he was treated in the past with FCR.  This is easily tolerated.  We discussed the risks, benefits, alternatives, and side effects of therapy.  He is educated that this medication will likely increase his WBC count significantly as expected and then taper downward.  I would recommend Allopurinol 300 mg daily to prevent tumor lysis syndrome.  He is agreeable to treatment.  His wife was present during conversation.  I will get him set-up for chemotherapy teaching.  He can start Allopurinol at any time.   Imbruvica will be tapered to maximum dose and we will start at 140 mg daily x 7 days.  An Rx was printed and given to Lendell Caprice for authorization.   Hematologically, he denies any complaints and ROS questioning is negative.    Past Medical History  Diagnosis Date  . Diabetes mellitus   . Leukemia 6.22.2012    PER PATIENT  . Hypertension   . Ulcer   . Anemia   . CLL (chronic lymphocytic leukemia) 01/24/2011  . Melanoma in situ 01/24/2011  . DM (diabetes mellitus) 01/24/2011  . Mild obesity 01/24/2011  . Eczema 01/24/2011  . Port catheter in place 05/02/2012    has CLL (chronic lymphocytic leukemia); Melanoma in situ; DM (diabetes mellitus); Mild obesity; Eczema; and Port catheter in place on his problem list.     is allergic to aspirin and ibuprofen.  Mr. Mcquarrie does not currently have medications on file.  Past Surgical History  Procedure Laterality Date  . Hernia repair  2001  . Bleeding ulcer    . Portacath placement  2008  . Cataract extraction w/phaco  05/05/2012    Procedure: CATARACT EXTRACTION PHACO AND INTRAOCULAR LENS PLACEMENT (IOC);  Surgeon: Tonny Branch, MD;  Location: [facility];  Service: Ophthalmology;  Laterality: Right;  CDE:13.25  . Cataract extraction w/phaco  05/15/2012    Procedure: CATARACT EXTRACTION PHACO AND INTRAOCULAR LENS PLACEMENT (IOC);  Surgeon: Tonny Branch, MD;  Location: [facility];  Service: Ophthalmology;  Laterality: Left;  CDE:  12.32    Denies any headaches, dizziness, double vision, fevers, chills, night sweats, nausea, vomiting, diarrhea, constipation, chest pain, heart palpitations,  shortness of breath, blood in stool, black tarry stool, urinary pain, urinary burning, urinary frequency, hematuria.   PHYSICAL EXAMINATION  ECOG PERFORMANCE STATUS: 0 - Asymptomatic  Filed Vitals:   05/28/13 0923  BP: 144/73  Pulse: 69  Temp: 97.6 F (36.4 C)  Resp: 20    GENERAL:alert, no distress, well nourished, well developed, comfortable, cooperative  and smiling SKIN: skin color, texture, turgor are normal, no rashes or significant lesions HEAD: Normocephalic, No masses, lesions, tenderness or abnormalities EYES: normal, PERRLA, EOMI, Conjunctiva are pink and non-injected EARS: External ears normal OROPHARYNX:mucous membranes are moist  NECK: supple, trachea midline LYMPH:  not examined BREAST:not examined LUNGS: not examined HEART: not examined ABDOMEN:not examined BACK: not examined EXTREMITIES:no cyanosis  NEURO: alert & oriented x 3 with fluent speech, no focal motor/sensory deficits, gait normal    LABORATORY DATA: CBC    Component Value Date/Time   WBC 36.7* 05/19/2013 1300   RBC 3.39* 05/19/2013 1300   HGB 12.2* 05/19/2013 1300   HCT 36.4* 05/19/2013 1300   PLT 151 05/19/2013 1300   MCV 107.4* 05/19/2013 1300   MCH 36.0* 05/19/2013 1300   MCHC 33.5 05/19/2013 1300   RDW 14.8 05/19/2013 1300   LYMPHSABS 26.5* 05/19/2013 1300   MONOABS 1.8* 05/19/2013 1300   EOSABS 2.2* 05/19/2013 1300   BASOSABS 0.0 05/19/2013 1300      Chemistry      Component Value Date/Time   NA 138 05/19/2013 1300   K 4.1 05/19/2013 1300   CL 101 05/19/2013 1300   CO2 24 05/19/2013 1300   BUN 16 05/19/2013 1300   CREATININE 1.42* 05/19/2013 1300      Component Value Date/Time   CALCIUM 9.0 05/19/2013 1300   ALKPHOS 76 05/19/2013 1300   AST 25 05/19/2013 1300   ALT 27 05/19/2013 1300   BILITOT 0.3 05/19/2013 1300     Results for LEDON, WEIHE (MRN [id number]) as of 05/27/2013 09:51  Ref. Range 05/19/2013 13:00  LDH Latest Range: 94-250 U/L 237   Results for AUDRICK, LAMOUREAUX (MRN [id number]) as of 05/27/2013 09:51  Ref. Range 05/19/2013 13:00  Beta-2 Microglobulin Latest Range: 1.01-1.73 mg/L 6.96 (H)   Results for WILBERN, PENNYPACKER (MRN [id number]) as of 05/27/2013 09:51  Ref. Range 05/19/2013 13:00  Sed Rate Latest Range: 0-16 mm/hr 104 (H)   Results for MEHUL, RUDIN (MRN [id number]) as of 05/27/2013 09:51  Ref. Range 09/23/2012 11:43 10/01/2012 09:25 12/16/2012  13:30 115-Apr-202014 13:30 05/19/2013 13:00  WBC Latest Range: 4.0-10.5 K/uL 10.7 (H) 9.8 15.2 (H) 25.8 (H) 36.7 (H)   Results for DOZIER, BERKOVICH (MRN [id number]) as of 05/27/2013 09:51  Ref. Range 09/23/2012 11:43 10/01/2012 09:25 12/16/2012 13:30 115-Apr-202014 13:30 05/19/2013 13:00  Lymphocytes Absolute Latest Range: 0.7-4.0 K/uL 6.4 (H) 6.0 (H) 9.7 (H) 16.3 (H) 26.5 (H)    Results for RITHY, MANDLEY (MRN [id number]) as of 05/27/2013 09:51  Ref. Range 09/23/2012 11:43 10/01/2012 09:25 12/16/2012 13:30 115-Apr-202014 13:30 05/19/2013 13:00  Hemoglobin Latest Range: 13.0-17.0 g/dL 11.7 (L) 12.0 (L) 12.0 (L) 11.9 (L) 12.2 (L)   Results for RITA, PROM (MRN [id number]) as of 05/27/2013 09:51  Ref. Range 09/23/2012 11:43 10/01/2012 09:25 12/16/2012 13:30 115-Apr-202014 13:30 05/19/2013 13:00  Platelets Latest Range: 150-400 K/uL 141 (L) 131 (L) 116 (L) 144 (L) 151   Results for BABATUNDE, SEAGO (MRN [id number]) as of 05/27/2013 09:51  Ref. Range 09/23/2012 11:43 10/01/2012 09:25 12/16/2012 13:30 115-Apr-202014 13:30 05/19/2013 13:00  Beta-2 Microglobulin Latest Range:  1.01-1.73 mg/L 5.38 (H)  6.30 (H) 7.04 (H) 6.96 (H)   Results for OMID, BRIGNER (MRN [id number]) as of 05/27/2013 09:51  Ref. Range 09/23/2012 11:43 10/01/2012 09:25 12/16/2012 13:30 04/07/2013 13:30 05/19/2013 13:00  LDH Latest Range: 94-250 U/L 193 173 214 226 237   Results for BRYCIN, CARRAWAY (MRN [id number]) as of 05/27/2013 09:51  Ref. Range 09/23/2012 11:43 10/01/2012 09:25 12/16/2012 13:30 04/07/2013 13:30 05/19/2013 13:00  Creatinine Latest Range: 0.50-1.35 mg/dL 1.20 1.35 1.47 (H) 1.58 (H) 1.42 (H)    PENDING LABS: Peripheral flow cytometry and cytogenetic looking for 17q- mutation     ASSESSMENT:  1. CLL presenting with stage 0 disease in October 2006 however by the end of June 2010 his white count risen to 180,000, beta-2 microglobulin level was up to 8.06, hemoglobin has been dropping gradually but steadily and he was significantly  hypogammaglobulinemic. His LDH was elevated. His lymphocyte count reached 175,000 and he had developed diffuse lymphadenopathy. He become symptomatic. We treated him with 3 cycles of fludarabine, Cytoxan, and Rituxan. He then developed significant pancytopenia requiring discontinuation of therapy. He is not require therapy since. His counts have slowly started to rise. Clinically, he is doing well and he is a candidate for Imbruvica therapy at this time.   Now with increasing WBC, lymphocyte count, LDH, Beta2-microglobulin level over 6 months.  He is agreeable to start [place].  Patient Active Problem List   Diagnosis Date Noted  . Port catheter in place 05/02/2012  . CLL (chronic lymphocytic leukemia) 01/24/2011  . Melanoma in situ 01/24/2011  . DM (diabetes mellitus) 01/24/2011  . Mild obesity 01/24/2011  . Eczema 01/24/2011    PLAN:  1. I personally reviewed and went over laboratory results with the patient. 2. Review of NCCN guidelines 3. Peripheral flow cytometry + cytogenetics looking for 17q- mutation 4. Discussed Imbruvica therapy 5. Discussed the risks, benefits, alternatives (including continued surveillance), and side effects including severe leukocytosis. 6. Allopurinol 300 mg daily.  Start any time. 7. Rx for Imbruvica 1 tab x 7 days, 2 tab x 7 days, 3 tabs thereafter.  This was given to Lendell Caprice, prior authorization specialist. 8. Chemotherapy teaching 9. Uric acid today 10. Return in 3 weeks for follow-up.   THERAPY PLAN:  We will get peripheral flow cytometry with cytogenetics looking for 17q- mutation for academic reasons.  I will start him on Allopurinol in preparation for Imbruvica therapy.  He will undergo chemotherapy teaching for [place] and once approved, he will start the medication and titrate upwards to maximum dose.  All questions were answered. The patient knows to call the clinic with any problems, questions or concerns. We can certainly see the  patient much sooner if necessary.  Patient and plan discussed with Dr. Farrel Gobble and he is in agreement with the aforementioned.   KEFALAS,THOMAS

## 2013-05-28 ENCOUNTER — Encounter (HOSPITAL_BASED_OUTPATIENT_CLINIC_OR_DEPARTMENT_OTHER): Payer: Medicare Other | Admitting: Oncology

## 2013-05-28 ENCOUNTER — Encounter (HOSPITAL_COMMUNITY): Payer: Self-pay | Admitting: Oncology

## 2013-05-28 ENCOUNTER — Encounter (HOSPITAL_BASED_OUTPATIENT_CLINIC_OR_DEPARTMENT_OTHER): Payer: Medicare Other

## 2013-05-28 VITALS — BP 144/73 | HR 69 | Temp 97.6°F | Resp 20 | Wt 142.4 lb

## 2013-05-28 DIAGNOSIS — C9110 Chronic lymphocytic leukemia of B-cell type not having achieved remission: Secondary | ICD-10-CM

## 2013-05-28 LAB — URIC ACID: Uric Acid, Serum: 6.8 mg/dL (ref 4.0–7.8)

## 2013-05-28 MED ORDER — IBRUTINIB 140 MG PO CAPS: ORAL_CAPSULE | ORAL | Status: DC

## 2013-05-28 MED ORDER — ALLOPURINOL 300 MG PO TABS: 300.0000 mg | ORAL_TABLET | Freq: Every day | ORAL | Status: DC

## 2013-05-28 NOTE — Patient Instructions (Signed)
[facility] Discharge Instructions  RECOMMENDATIONS MADE BY THE CONSULTANT AND ANY TEST RESULTS WILL BE SENT TO YOUR REFERRING PHYSICIAN.  EXAM FINDINGS BY THE PHYSICIAN TODAY AND SIGNS OR SYMPTOMS TO REPORT TO CLINIC OR PRIMARY PHYSICIAN: Exam and findings as discussed by Robynn Pane.  MEDICATIONS PRESCRIBED:  Imbruvica  INSTRUCTIONS/FOLLOW-UP: Return for follow up visit and blood work in 3 weeks  Hildred Alamin will call you for an appointment for chemotherapy teaching regarding the [place].   Thank you for choosing [facility] to provide your oncology and hematology care.  To afford each patient quality time with our providers, please arrive at least 15 minutes before your scheduled appointment time.  With your help, our goal is to use those 15 minutes to complete the necessary work-up to ensure our physicians have the information they need to help with your evaluation and healthcare recommendations.    Effective January 1st, 2014, we ask that you re-schedule your appointment with our physicians should you arrive 10 or more minutes late for your appointment.  We strive to give you quality time with our providers, and arriving late affects you and other patients whose appointments are after yours.    Again, thank you for choosing [facility].  Our hope is that these requests will decrease the amount of time that you wait before being seen by our physicians.       _____________________________________________________________  Should you have questions after your visit to [facility], please contact our office at [phone] between the hours of 8:30 a.m. and 5:00 p.m.  Voicemails left after 4:30 p.m. will not be returned until the following business day.  For prescription refill requests, have your pharmacy contact our office with your prescription refill request.

## 2013-05-28 NOTE — Progress Notes (Signed)
Labs drawn today for uric acid and Flow

## 2013-05-29 NOTE — Patient Instructions (Addendum)
Randall Reyes   CHEMOTHERAPY INSTRUCTIONS  How should I take [place]?   Take IMBRUVICA exactly as your healthcare provider tells you to take it.  Take IMBRUVICA 1 time a day.  Swallow IMBRUVICA capsules whole with a glass of water. Do not open, break, or chew IMBRUVICA capsules.  Take IMBRUVICA at about the same time each day.  If you miss a dose of IMBRUVICA take it as soon as you remember on the same day. Take your next dose of IMBRUVICA at your regular time on the next day. Do not take 2 doses of IMBRUVICA on the same day to make up for a missed dose.  What should I avoid while taking IMBRUVICA?   You should not drink grapefruit juice, eat grapefruit, or eat [place] oranges (often used in marmalades) while you are taking IMBRUVICA. These products may increase the amount of IMBRUVICA in your blood.   The most common side effects of IMBRUVICA in people with chronic lymphocytic leukemia (CLL) include: low blood platelet count, low white blood cell count, diarrhea, low red blood cell count, tiredness, muscle and bone pain, upper respiratory tract infection, rash, nausea, and fever. Let us know if you develop a fast heart beat or irregular heart beat.   Instructions for Administration:   Imbruvica 140mg  capsule. Take 1 capsule daily x 7 days, then increase to 2 capsules daily x 7 days, then increase to 3 capsules daily.     EDUCATIONAL MATERIALS GIVEN AND REVIEWED: Chemotherapy and You  Specific Instructions Sheets: Imbruvica   SELF CARE ACTIVITIES WHILE ON CHEMOTHERAPY:  Increase your fluid intake 48 hours prior to treatment and drink at least 2 quarts per day after treatment., No alcohol intake., No aspirin or other medications unless approved by your oncologist., Eat foods that are light and easy to digest., Eat foods at cold or room temperature., No fried, fatty, or spicy foods immediately before or after treatment., Have teeth cleaned  professionally before starting treatment. Keep dentures and partial plates clean., Use soft toothbrush and do not use mouthwashes that contain alcohol. Biotene is a good mouthwash. Use warm salt water gargles (1 teaspoon salt per 1 quart warm water) before and after meals and at bedtime. Or you may rinse with 2 tablespoons of three -percent hydrogen peroxide mixed in eight ounces of water., Always use sunscreen with SPF (Sun Protection Factor) of 30 or higher., Use your nausea medication as directed to prevent nausea., Use your stool softener or laxative as directed to prevent constipation. and Use your anti-diarrheal medication as directed to stop diarrhea.  Please wash your hands for at least 30 seconds using warm soapy water. Handwashing is the #1 way to prevent the spread of germs. Stay away from sick people or people who are getting over a cold. If you develop respiratory systems such as green/yellow mucus production or productive cough or persistent cough let us know and we will see if you need an antibiotic. It is a good idea to keep a pair of gloves on when going into grocery stores/[employer] to decrease your risk of coming into contact with germs on the carts, etc. Carry alcohol hand gel with you at all times and use it frequently if out in public. All foods need to be cooked thoroughly. No raw foods. No medium or undercooked meats, eggs. If your food is cooked medium well, it does not need to be hot pink or saturated with bloody liquid at all. Vegetables and fruits  need to be washed/rinsed under the faucet with a dish detergent before being consumed. You can eat raw fruits and vegetables unless we tell you otherwise but it would be best if you cooked them or bought frozen. Do not eat off of salad bars or hot bars unless you really trust the cleanliness of the restaurant. If you need dental work, please let us know before you go for your appointment so that we can coordinate the best possible time for you  in regards to your chemo regimen. You need to also let your dentist know that you are actively taking chemo. We may need to do labs prior to your dental appointment. We also want your bowels moving at least every other day. If this is not happening, we need to know so that we can get you on a bowel regimen to help you go.     MEDICATIONS: You have been given prescriptions for the following medications:  Imbruvica 140mg  capsule. Take 1 capsule daily x 7 days, then increase to 2 capsules daily x 7 days, then increase to 3 capsules daily.   Over-the-Counter Meds: Colace - this is a stool softener. Take 100mg  capsule 2-6 times a day as needed. If you have to take more than 6 capsules of Colace a day call the [facility].  Senna - this is a mild laxative used to treat mild constipation. May take 2 tabs by mouth daily or up to twice a day as needed for mild constipation.  Milk of Magnesia - this is a laxative used to treat moderate to severe constipation. May take 2-4 tablespoons every 8 hours as needed. May increase to 8 tablespoons x 1 dose and if no bowel movement call the [place].  Imodium - this is for diarrhea. Take 2 tabs after 1st loose stool and then 1 tab after each loose stool until you go a total of 12 hours without a loose stool. Call [facility] if loose stools continue.    SYMPTOMS TO REPORT AS SOON AS POSSIBLE AFTER TREATMENT:  FEVER GREATER THAN 100.5 F  CHILLS WITH OR WITHOUT FEVER  NAUSEA AND VOMITING THAT IS NOT CONTROLLED WITH YOUR NAUSEA MEDICATION  UNUSUAL SHORTNESS OF BREATH  UNUSUAL BRUISING OR BLEEDING  TENDERNESS IN MOUTH AND THROAT WITH OR WITHOUT PRESENCE OF ULCERS  URINARY PROBLEMS  BOWEL PROBLEMS  UNUSUAL RASH    Wear comfortable clothing and clothing appropriate for easy access to any Portacath or PICC line. Let us know if there is anything that we can do to make your therapy better!      I have been informed and understand all of the  instructions given to me and have received a copy. I have been instructed to call the clinic [phone] or my family physician as soon as possible for continued medical care, if indicated. I do not have any more questions at this time but understand that I may call the [facility] or the Patient Navigator at [phone] during office hours should I have questions or need assistance in obtaining follow-up care.      _________________________________________      _______________     __________ Signature of Patient or Authorized Representative        Date                            Time      _________________________________________ Nurse's Signature

## 2013-06-01 ENCOUNTER — Encounter (HOSPITAL_BASED_OUTPATIENT_CLINIC_OR_DEPARTMENT_OTHER): Payer: Medicare Other

## 2013-06-01 ENCOUNTER — Encounter (HOSPITAL_COMMUNITY): Payer: Medicare Other

## 2013-06-01 DIAGNOSIS — C9110 Chronic lymphocytic leukemia of B-cell type not having achieved remission: Secondary | ICD-10-CM

## 2013-06-01 NOTE — Progress Notes (Signed)
Chemo teaching done and consent signed for Imbruvica. Patient and wife to call me when drug comes in. Med to go through [employer].

## 2013-06-01 NOTE — Progress Notes (Signed)
Labs drawn today for smear and flow 11q/17p

## 2013-06-02 ENCOUNTER — Other Ambulatory Visit (HOSPITAL_COMMUNITY): Payer: Medicare Other

## 2013-06-03 LAB — TISSUE HYBRIDIZATION TO NCBH

## 2013-06-09 ENCOUNTER — Encounter (HOSPITAL_COMMUNITY): Payer: Self-pay

## 2013-06-12 ENCOUNTER — Other Ambulatory Visit (HOSPITAL_COMMUNITY): Payer: Self-pay | Admitting: Oncology

## 2013-06-17 NOTE — Progress Notes (Signed)
Randall Hampshire, MD [place] [address]  CLL (chronic lymphocytic leukemia) - Plan: ibrutinib (IMBRUVICA) 140 MG capsul  CURRENT THERAPY: To start Imbruvica once medication is delivered.  INTERVAL HISTORY: Randall Reyes 78 y.o. male returns for  regular  visit for followup of CLL (without 11q or 17 p deletion) presenting with stage 0 disease in October 2006 however by the end of June 2010 his white count risen to 180,000, beta-2 microglobulin level was up to 8.06, hemoglobin has been dropping gradually but steadily and he was significantly hypogammaglobulinemic. His LDH was elevated. His lymphocyte count reached 175,000 and he had developed diffuse lymphadenopathy. He become symptomatic. We treated him with 3 cycles of fludarabine, Cytoxan, and Rituxan. He then developed significant pancytopenia requiring discontinuation of therapy. He has not require therapy since. To start Imbruvica for clinical findings demonstrating progressive CLL.   I personally reviewed and went over laboratory results with the patient.  The results are noted within this dictation.   Last week, Randall Reyes came down with a cold and presented to his primary care physician, Dr. Everette Rank, who gave him a prescription for antibiotics and cough medication. Elwin reports he is feeling much better from that standpoint.  The patient notes night sweats, drenching night sweats, for approximately 2 weeks. He initially blamed this on a cold, but I suspect this could be part of his CLL as well as documented as a B. symptom.  The patient's wife reports that he receive a letter in the mail showing that Randall Reyes will be covered by his insurance company. The medication has not yet been delivered. I printed a new prescription and provided to our prior authorization specialist, Lendell Caprice, to see if this medication to be filled by [facility] and the patient can come to the clinic to pick up a prescription from  our pharmacy. We will see what his insurance company will allow.  She otherwise denies any complaints and ROS questioning is negative. I answered all questions regarding Imbruvica to the patient and his wife's satisfaction. He will call us when Randall Reyes is delivered and started so we can coordinate weekly CBCs.  Past Medical History  Diagnosis Date  . Diabetes mellitus   . Leukemia 6.22.2012    PER PATIENT  . Hypertension   . Ulcer   . Anemia   . CLL (chronic lymphocytic leukemia) 01/24/2011  . Melanoma in situ 01/24/2011  . DM (diabetes mellitus) 01/24/2011  . Mild obesity 01/24/2011  . Eczema 01/24/2011  . Port catheter in place 05/02/2012    has CLL (chronic lymphocytic leukemia); Melanoma in situ; DM (diabetes mellitus); Mild obesity; Eczema; and Port catheter in place on his problem list.     is allergic to aspirin and ibuprofen.  Mr. Hazzard had no medications administered during this visit.  Past Surgical History  Procedure Laterality Date  . Hernia repair  2001  . Bleeding ulcer    . Portacath placement  2008  . Cataract extraction w/phaco  05/05/2012    Procedure: CATARACT EXTRACTION PHACO AND INTRAOCULAR LENS PLACEMENT (IOC);  Surgeon: Tonny Branch, MD;  Location: [facility];  Service: Ophthalmology;  Laterality: Right;  CDE:13.25  . Cataract extraction w/phaco  05/15/2012    Procedure: CATARACT EXTRACTION PHACO AND INTRAOCULAR LENS PLACEMENT (IOC);  Surgeon: Tonny Branch, MD;  Location: [facility];  Service: Ophthalmology;  Laterality: Left;  CDE:  12.32    Denies any headaches, dizziness, double vision, fevers, chills, nausea, vomiting, diarrhea,  constipation, chest pain, heart palpitations, shortness of breath, blood in stool, black tarry stool, urinary pain, urinary burning, urinary frequency, hematuria.   PHYSICAL EXAMINATION  ECOG PERFORMANCE STATUS: 1 - Symptomatic but completely ambulatory  Filed Vitals:   06/18/13 0910  BP: 119/59  Pulse: 99  Temp: 97.5 F (36.4 C)    Resp: 18    GENERAL:alert, no distress, well nourished, well developed, comfortable, cooperative and smiling SKIN: skin color, texture, turgor are normal, no rashes or significant lesions HEAD: Normocephalic, No masses, lesions, tenderness or abnormalities EYES: normal, PERRLA, EOMI, Conjunctiva are pink and non-injected EARS: External ears normal OROPHARYNX:mucous membranes are moist  NECK: supple, trachea midline LYMPH:  not examined BREAST:not examined LUNGS: not examined HEART: not examined ABDOMEN:not examined BACK: not examined EXTREMITIES:no joint deformities, effusion, or inflammation, no clubbing, no cyanosis  NEURO: alert & oriented x 3 with fluent speech, no focal motor/sensory deficits, gait normal   LABORATORY DATA: CBC    Component Value Date/Time   WBC PENDING 06/18/2013 0854   RBC 3.64* 06/18/2013 0854   HGB 12.9* 06/18/2013 0854   HCT 39.1 06/18/2013 0854   PLT 151 06/18/2013 0854   MCV 107.4* 06/18/2013 0854   MCH 35.4* 06/18/2013 0854   MCHC 33.0 06/18/2013 0854   RDW 15.2 06/18/2013 0854   LYMPHSABS PENDING 06/18/2013 0854   MONOABS PENDING 06/18/2013 0854   EOSABS PENDING 06/18/2013 0854   BASOSABS PENDING 06/18/2013 0854     ASSESSMENT:  1. CLL (without 11q or 17 p deletion) presenting with stage 0 disease in October 2006 however by the end of June 2010 his white count risen to 180,000, beta-2 microglobulin level was up to 8.06, hemoglobin has been dropping gradually but steadily and he was significantly hypogammaglobulinemic. His LDH was elevated. His lymphocyte count reached 175,000 and he had developed diffuse lymphadenopathy. He become symptomatic. We treated him with 3 cycles of fludarabine, Cytoxan, and Rituxan. He then developed significant pancytopenia requiring discontinuation of therapy. He has not require therapy since. Now on Imbruvica for clinical findings demonstrating progressive CLL.    2. Night sweats, drenching x 2 weeks 3. URI, treated by Dr. Everette Rank,  feeling better.  Patient Active Problem List   Diagnosis Date Noted  . Port catheter in place 05/02/2012  . CLL (chronic lymphocytic leukemia) 01/24/2011  . Melanoma in situ 01/24/2011  . DM (diabetes mellitus) 01/24/2011  . Mild obesity 01/24/2011  . Eczema 01/24/2011    PLAN:  1. I personally reviewed and went over laboratory results with the patient.  The results are noted within this dictation. 2. Discussed the risks, benefits, alternatives (including continued surveillance), and side effects including severe leukocytosis. 3. Continue Allopurinol daily.  One Rx is completed, he can stop. 4. Complete course of antibiotics. 5. Imbruvica Rx re-printed and provided to [employer] 6. Patient will call the clinic when medication is delivered and he starts it. 7. Once Imbruvica is started, CBC diff weekly 8. All questions answered regarding Imbruvica therapy.  9. Return in 4 weeks for follow-up.   THERAPY PLAN:  He will start Imbruvica once the medication is delivered.  We will initially perform weekly CBC diffs at that time.  His WBC is expected to increase significantly as Imbruvica draws WBCs out of the lymphatic system and into the vascular system for destruction.  Chemotherapy teaching completed.  All questions were answered. The patient knows to call the clinic with any problems, questions or concerns. We can certainly see the patient much sooner  if necessary.  Patient and plan discussed with Dr. Farrel Gobble and he is in agreement with the aforementioned.   Taytum Scheck

## 2013-06-18 ENCOUNTER — Encounter (HOSPITAL_BASED_OUTPATIENT_CLINIC_OR_DEPARTMENT_OTHER): Payer: Medicare Other

## 2013-06-18 ENCOUNTER — Encounter (HOSPITAL_COMMUNITY): Payer: Self-pay | Admitting: *Deleted

## 2013-06-18 ENCOUNTER — Encounter (HOSPITAL_COMMUNITY): Payer: Medicare Other | Attending: Oncology | Admitting: Oncology

## 2013-06-18 ENCOUNTER — Encounter (HOSPITAL_COMMUNITY): Payer: Self-pay | Admitting: Oncology

## 2013-06-18 ENCOUNTER — Telehealth (HOSPITAL_COMMUNITY): Payer: Self-pay | Admitting: *Deleted

## 2013-06-18 VITALS — BP 119/59 | HR 99 | Temp 97.5°F | Resp 18 | Wt 139.1 lb

## 2013-06-18 DIAGNOSIS — Z9889 Other specified postprocedural states: Secondary | ICD-10-CM | POA: Insufficient documentation

## 2013-06-18 DIAGNOSIS — I998 Other disorder of circulatory system: Secondary | ICD-10-CM | POA: Insufficient documentation

## 2013-06-18 DIAGNOSIS — R61 Generalized hyperhidrosis: Secondary | ICD-10-CM

## 2013-06-18 DIAGNOSIS — C9110 Chronic lymphocytic leukemia of B-cell type not having achieved remission: Secondary | ICD-10-CM | POA: Insufficient documentation

## 2013-06-18 LAB — COMPREHENSIVE METABOLIC PANEL
ALT: 16 U/L (ref 0–53)
AST: 22 U/L (ref 0–37)
Albumin: 3.5 g/dL (ref 3.5–5.2)
Alkaline Phosphatase: 74 U/L (ref 39–117)
BUN: 23 mg/dL (ref 6–23)
CO2: 23 mEq/L (ref 19–32)
Calcium: 8.9 mg/dL (ref 8.4–10.5)
Chloride: 99 mEq/L (ref 96–112)
Creatinine, Ser: 2.23 mg/dL — ABNORMAL HIGH (ref 0.50–1.35)
GFR calc Af Amer: 31 mL/min — ABNORMAL LOW (ref >90)
GFR calc non Af Amer: 27 mL/min — ABNORMAL LOW (ref >90)
Glucose, Bld: 115 mg/dL — ABNORMAL HIGH (ref 70–99)
Potassium: 4.5 mEq/L (ref 3.7–5.3)
Sodium: 136 mEq/L — ABNORMAL LOW (ref 137–147)
Total Bilirubin: 0.4 mg/dL (ref 0.3–1.2)
Total Protein: 9.2 g/dL — ABNORMAL HIGH (ref 6.0–8.3)

## 2013-06-18 LAB — CBC WITH DIFFERENTIAL/PLATELET
Band Neutrophils: 0 % (ref 0–10)
Basophils Absolute: 0 10*3/uL (ref 0.0–0.1)
Basophils Relative: 0 % (ref 0–1)
Blasts: 0 %
Eosinophils Absolute: 0 10*3/uL (ref 0.0–0.7)
Eosinophils Relative: 0 % (ref 0–5)
HCT: 39.1 % (ref 39.0–52.0)
Hemoglobin: 12.9 g/dL — ABNORMAL LOW (ref 13.0–17.0)
Lymphocytes Relative: 92 % — ABNORMAL HIGH (ref 12–46)
Lymphs Abs: 48.9 10*3/uL — ABNORMAL HIGH (ref 0.7–4.0)
MCH: 35.4 pg — ABNORMAL HIGH (ref 26.0–34.0)
MCHC: 33 g/dL (ref 30.0–36.0)
MCV: 107.4 fL — ABNORMAL HIGH (ref 78.0–100.0)
Metamyelocytes Relative: 0 %
Monocytes Absolute: 2.7 10*3/uL — ABNORMAL HIGH (ref 0.1–1.0)
Monocytes Relative: 5 % (ref 3–12)
Myelocytes: 0 %
Neutro Abs: 1.6 10*3/uL — ABNORMAL LOW (ref 1.7–7.7)
Neutrophils Relative %: 3 % — ABNORMAL LOW (ref 43–77)
Platelets: 151 10*3/uL (ref 150–400)
Promyelocytes Absolute: 0 %
RBC: 3.64 MIL/uL — ABNORMAL LOW (ref 4.22–5.81)
RDW: 15.2 % (ref 11.5–15.5)
WBC: 53.2 10*3/uL (ref 4.0–10.5)
nRBC: 0 /100 WBC

## 2013-06-18 LAB — LACTATE DEHYDROGENASE: LDH: 257 U/L — ABNORMAL HIGH (ref 94–250)

## 2013-06-18 LAB — SEDIMENTATION RATE: Sed Rate: 109 mm/hr — ABNORMAL HIGH (ref 0–16)

## 2013-06-18 MED ORDER — IBRUTINIB 140 MG PO CAPS: ORAL_CAPSULE | ORAL | Status: DC

## 2013-06-18 NOTE — Telephone Encounter (Signed)
.  CRITICAL VALUE ALERT Critical value received:  WBC 53.23 Date of notification:  06/18/2013 Time of notification: 1027 Critical value read back:  yes Nurse who received alert:  TAR  MD notified (1st page):  [place] 

## 2013-06-18 NOTE — Patient Instructions (Signed)
[facility] Discharge Instructions  RECOMMENDATIONS MADE BY THE CONSULTANT AND ANY TEST RESULTS WILL BE SENT TO YOUR REFERRING PHYSICIAN.  EXAM FINDINGS BY THE PHYSICIAN TODAY AND SIGNS OR SYMPTOMS TO REPORT TO CLINIC OR PRIMARY PHYSICIAN: Exam and findings as discussed by Robynn Pane, PA-C.  Will try to find out what's going on with the Inbruvica.  Once you get the medication call the clinic to let us know because we need to get you scheduled for weekly lab work.  MEDICATIONS PRESCRIBED:  [name] - take as directed (call us when you get it)  We need to get you scheduled for chemotherapy teaching.  INSTRUCTIONS/FOLLOW-UP: Weekly labs once you get the Imbruvica Follow-up in 4 weeks with provider.  Thank you for choosing [facility] to provide your oncology and hematology care.  To afford each patient quality time with our providers, please arrive at least 15 minutes before your scheduled appointment time.  With your help, our goal is to use those 15 minutes to complete the necessary work-up to ensure our physicians have the information they need to help with your evaluation and healthcare recommendations.    Effective January 1st, 2014, we ask that you re-schedule your appointment with our physicians should you arrive 10 or more minutes late for your appointment.  We strive to give you quality time with our providers, and arriving late affects you and other patients whose appointments are after yours.    Again, thank you for choosing [facility]  Our hope is that these requests will decrease the amount of time that you wait before being seen by our physicians.       _____________________________________________________________  Should you have questions after your visit to [facility], please contact our office at [phone] between the hours of 8:30 a.m. and 5:00 p.m.  Voicemails left after 4:30 p.m. will not be returned  until the following business day.  For prescription refill requests, have your pharmacy contact our office with your prescription refill request.

## 2013-06-18 NOTE — Progress Notes (Signed)
.  CRITICAL VALUE ALERT Critical value received:  WBC 53.23 Date of notification:  06/18/2013 Time of notification: 1027 Critical value read back:  yes Nurse who received alert:  TAR  MD notified (1st page):  Barnet Glasgow

## 2013-06-18 NOTE — Progress Notes (Signed)
Labs drawn today for cbc/diff,cmp,ldh,sed rate,b83mic

## 2013-06-19 ENCOUNTER — Other Ambulatory Visit (HOSPITAL_COMMUNITY): Payer: Self-pay | Admitting: *Deleted

## 2013-06-19 DIAGNOSIS — C9110 Chronic lymphocytic leukemia of B-cell type not having achieved remission: Secondary | ICD-10-CM

## 2013-06-19 LAB — BETA 2 MICROGLOBULIN, SERUM: BETA 2 MICROGLOBULIN: 10.86 mg/L — AB (ref 1.01–1.73)

## 2013-06-22 ENCOUNTER — Telehealth (HOSPITAL_COMMUNITY): Payer: Self-pay

## 2013-06-22 NOTE — Telephone Encounter (Signed)
Received Imbruvica today and plans to start it tonight.  Labs scheduled for 06/29/13.

## 2013-06-25 ENCOUNTER — Encounter (HOSPITAL_BASED_OUTPATIENT_CLINIC_OR_DEPARTMENT_OTHER): Payer: Medicare Other

## 2013-06-25 ENCOUNTER — Telehealth (HOSPITAL_COMMUNITY): Payer: Self-pay | Admitting: *Deleted

## 2013-06-25 ENCOUNTER — Encounter (HOSPITAL_BASED_OUTPATIENT_CLINIC_OR_DEPARTMENT_OTHER): Payer: Medicare Other | Admitting: Oncology

## 2013-06-25 VITALS — BP 122/81 | HR 96 | Temp 97.6°F | Resp 20

## 2013-06-25 DIAGNOSIS — R58 Hemorrhage, not elsewhere classified: Secondary | ICD-10-CM

## 2013-06-25 DIAGNOSIS — I998 Other disorder of circulatory system: Secondary | ICD-10-CM

## 2013-06-25 DIAGNOSIS — C9110 Chronic lymphocytic leukemia of B-cell type not having achieved remission: Secondary | ICD-10-CM

## 2013-06-25 LAB — CBC
HEMATOCRIT: 36 % — AB (ref 39.0–52.0)
Hemoglobin: 11.8 g/dL — ABNORMAL LOW (ref 13.0–17.0)
MCH: 35.5 pg — ABNORMAL HIGH (ref 26.0–34.0)
MCHC: 32.8 g/dL (ref 30.0–36.0)
MCV: 108.4 fL — AB (ref 78.0–100.0)
PLATELETS: 141 10*3/uL — AB (ref 150–400)
RBC: 3.32 MIL/uL — ABNORMAL LOW (ref 4.22–5.81)
RDW: 15.2 % (ref 11.5–15.5)
WBC: 99.7 10*3/uL — AB (ref 4.0–10.5)

## 2013-06-25 NOTE — Progress Notes (Signed)
Labs drawn today for cbc/diff 

## 2013-06-25 NOTE — Progress Notes (Signed)
Randall Reyes is seen as a work-in today.  His wife called reporting an ecchymosis in there popliteal region and assessment over the telephone was futile.  Randall Reyes reports that he noticed the popliteal ecchymosis yesterday.  He reports that his right leg (the leg with the ecchymosis) was uncomfortable to walk on yesterday, but is improved today.  He denies any known trauma, but on further questioning he notes that last week he watched a local basketball game on a solid bench in the court.    BP 122/81  Pulse 96  Temp(Src) 97.6 F (36.4 C) (Oral)  Resp 20 Gen: Atraumatic, well, pleasant, NAD HEENT: neck is supple, trachea midline Oropharynx: normal Skin: Warm and dry Extremities: Right posterior leg with an ecchymosis with yellowing of border superior to the right popliteal area.  No erythema, heat, or edema of right leg.  Tenderness of ecchymosis on palpation. Neuro: No focal deficits  Assessment 1. Right posterior leg ecchymosis, resolving with yellow border of ecchymosis 2. CLL, started Imbruvica on 06/22/2013  Plan: 1. NSAID/Tylenol for pain control 2. Heat for comfort 3. Continue Imbruvica 4. CBC today to verify platelet count is adequate 5. Return on Monday with lab work as planned  Patient and plan discussed with Dr. Farrel Gobble and he is in agreement with the aforementioned.   KEFALAS,THOMAS

## 2013-06-25 NOTE — Telephone Encounter (Signed)
Per [name], they expected his WBC count to increase. Platelets are stable and wife notified. She said ok.

## 2013-06-25 NOTE — Progress Notes (Signed)
CRITICAL VALUE ALERT Critical value received:  WBC 99.7 Date of notification:  06/25/13 Time of notification: 1220 Critical value read back:  yes Nurse who received alert:  T.Khup Sapia,RN MD notified (1st page):  T.Kefalas,PA verbal notification at 1220 pm

## 2013-06-25 NOTE — Patient Instructions (Signed)
[facility] Discharge Instructions  RECOMMENDATIONS MADE BY THE CONSULTANT AND ANY TEST RESULTS WILL BE SENT TO YOUR REFERRING PHYSICIAN.  EXAM FINDINGS BY THE PHYSICIAN TODAY AND SIGNS OR SYMPTOMS TO REPORT TO CLINIC OR PRIMARY PHYSICIAN: Exam and findings as discussed by Robynn Pane, PA-C.  Will check blood work today.  Don't think there's anything to worry about with your leg.  Keep your scheduled follow-up  MEDICATIONS PRESCRIBED:  none  INSTRUCTIONS/FOLLOW-UP: As scheduled.  Thank you for choosing [facility] to provide your oncology and hematology care.  To afford each patient quality time with our providers, please arrive at least 15 minutes before your scheduled appointment time.  With your help, our goal is to use those 15 minutes to complete the necessary work-up to ensure our physicians have the information they need to help with your evaluation and healthcare recommendations.    Effective January 1st, 2014, we ask that you re-schedule your appointment with our physicians should you arrive 10 or more minutes late for your appointment.  We strive to give you quality time with our providers, and arriving late affects you and other patients whose appointments are after yours.    Again, thank you for choosing [facility].  Our hope is that these requests will decrease the amount of time that you wait before being seen by our physicians.       _____________________________________________________________  Should you have questions after your visit to [facility], please contact our office at [phone] between the hours of 8:30 a.m. and 5:00 p.m.  Voicemails left after 4:30 p.m. will not be returned until the following business day.  For prescription refill requests, have your pharmacy contact our office with your prescription refill request.

## 2013-06-29 ENCOUNTER — Encounter (HOSPITAL_BASED_OUTPATIENT_CLINIC_OR_DEPARTMENT_OTHER): Payer: Medicare Other

## 2013-06-29 ENCOUNTER — Encounter (HOSPITAL_BASED_OUTPATIENT_CLINIC_OR_DEPARTMENT_OTHER): Payer: Medicare Other | Admitting: Oncology

## 2013-06-29 DIAGNOSIS — C9110 Chronic lymphocytic leukemia of B-cell type not having achieved remission: Secondary | ICD-10-CM

## 2013-06-29 DIAGNOSIS — R21 Rash and other nonspecific skin eruption: Secondary | ICD-10-CM

## 2013-06-29 LAB — CBC WITH DIFFERENTIAL/PLATELET
Band Neutrophils: 0 % (ref 0–10)
Basophils Absolute: 0 K/uL (ref 0.0–0.1)
Basophils Relative: 0 % (ref 0–1)
Blasts: 0 %
Eosinophils Absolute: 1.6 K/uL — ABNORMAL HIGH (ref 0.0–0.7)
Eosinophils Relative: 2 % (ref 0–5)
HCT: 31.5 % — ABNORMAL LOW (ref 39.0–52.0)
Hemoglobin: 10.2 g/dL — ABNORMAL LOW (ref 13.0–17.0)
Lymphocytes Relative: 91 % — ABNORMAL HIGH (ref 12–46)
Lymphs Abs: 74.2 K/uL — ABNORMAL HIGH (ref 0.7–4.0)
MCH: 35.3 pg — ABNORMAL HIGH (ref 26.0–34.0)
MCHC: 32.4 g/dL (ref 30.0–36.0)
MCV: 109 fL — ABNORMAL HIGH (ref 78.0–100.0)
Metamyelocytes Relative: 0 %
Monocytes Absolute: 0.8 K/uL (ref 0.1–1.0)
Monocytes Relative: 1 % — ABNORMAL LOW (ref 3–12)
Myelocytes: 0 %
Neutro Abs: 4.9 K/uL (ref 1.7–7.7)
Neutrophils Relative %: 6 % — ABNORMAL LOW (ref 43–77)
Platelets: 177 K/uL (ref 150–400)
Promyelocytes Absolute: 0 %
RBC: 2.89 MIL/uL — ABNORMAL LOW (ref 4.22–5.81)
RDW: 15.1 % (ref 11.5–15.5)
WBC: 81.5 K/uL (ref 4.0–10.5)
nRBC: 0 /100{WBCs}

## 2013-06-29 NOTE — Progress Notes (Signed)
Randall Reyes is seen as a work in today with laboratory work.  Please see my in counter from 06/25/2013 for more details.  And reports that his ecchymosis of his right leg has spread significantly over the weekend. It has spread superiorly and inferiorly. This could be secondary to his Imbruvica chemotherapy as he PI describes a violaceous rash which certainly meets the patient's issue presently.  Ecchymosis/violaceous rash is noted in the popliteal region of the right lower leg and spreads superiorly and inferiorly favoring the medial side of the upper leg.  We've asked him to hold his chemotherapy drug, Imbruvica, and I'll see him back next week and see if this is improving. Again, he denies any trauma to this area. He is not aspirin or blood thinners including Coumadin.  The patient is seen today with Dr. Barnet Glasgow.   Patient and plan discussed with Dr. Farrel Gobble and he is in agreement with the aforementioned.   Randall Reyes

## 2013-06-29 NOTE — Progress Notes (Signed)
CRITICAL VALUE ALERT  Critical value received:  WBC 81.5  Date of notification:  06/29/13  Time of notification:  1000  Critical value read back:yes  Nurse who received alert:  Lupita Raider RN  Kirby Crigler PA-C and he said ok.

## 2013-06-29 NOTE — Progress Notes (Signed)
Labs drawn today for cbc/diff 

## 2013-07-06 ENCOUNTER — Encounter (HOSPITAL_BASED_OUTPATIENT_CLINIC_OR_DEPARTMENT_OTHER): Payer: Medicare Other

## 2013-07-06 ENCOUNTER — Telehealth (HOSPITAL_COMMUNITY): Payer: Self-pay

## 2013-07-06 DIAGNOSIS — C9110 Chronic lymphocytic leukemia of B-cell type not having achieved remission: Secondary | ICD-10-CM

## 2013-07-06 LAB — CBC WITH DIFFERENTIAL/PLATELET
BAND NEUTROPHILS: 0 % (ref 0–10)
BASOS ABS: 0 10*3/uL (ref 0.0–0.1)
BASOS PCT: 0 % (ref 0–1)
Blasts: 0 %
EOS ABS: 2.2 10*3/uL — AB (ref 0.0–0.7)
Eosinophils Relative: 4 % (ref 0–5)
HEMATOCRIT: 32.2 % — AB (ref 39.0–52.0)
HEMOGLOBIN: 10.6 g/dL — AB (ref 13.0–17.0)
LYMPHS ABS: 47.3 10*3/uL — AB (ref 0.7–4.0)
Lymphocytes Relative: 85 % — ABNORMAL HIGH (ref 12–46)
MCH: 36.2 pg — AB (ref 26.0–34.0)
MCHC: 32.9 g/dL (ref 30.0–36.0)
MCV: 109.9 fL — ABNORMAL HIGH (ref 78.0–100.0)
MYELOCYTES: 0 %
Metamyelocytes Relative: 0 %
Monocytes Absolute: 0.6 10*3/uL (ref 0.1–1.0)
Monocytes Relative: 1 % — ABNORMAL LOW (ref 3–12)
NEUTROS ABS: 5.6 10*3/uL (ref 1.7–7.7)
Neutrophils Relative %: 10 % — ABNORMAL LOW (ref 43–77)
Platelets: 202 10*3/uL (ref 150–400)
Promyelocytes Absolute: 0 %
RBC: 2.93 MIL/uL — ABNORMAL LOW (ref 4.22–5.81)
RDW: 15.3 % (ref 11.5–15.5)
WBC: 55.7 10*3/uL (ref 4.0–10.5)
nRBC: 0 /100 WBC

## 2013-07-06 NOTE — Progress Notes (Signed)
Labs drawn today for cbc/diff 

## 2013-07-06 NOTE — Telephone Encounter (Signed)
CRITICAL VALUE ALERT Critical value received:  WBC 55,700 Date of notification:  07/05/13  Time of notification: 3704 Critical value read back:  yes Nurse who received alert:  Mickie Kay, RN Robynn Pane, PA-C notified.

## 2013-07-12 NOTE — Progress Notes (Signed)
Randall Hampshire, MD [place] [address]  CLL (chronic lymphocytic leukemia) - Plan: idelalisib (ZYDELIG) 150 MG tablet  CURRENT THERAPY: Imbruvica on hold due to Imbruvica-induced ecchymosis  INTERVAL HISTORY: Randall Reyes 78 y.o. male returns for  regular  visit for followup of CLL (without 11q or 17 p deletion) presenting with stage 0 disease in October 2006 however by the end of June 2010 his white count risen to 180,000, beta-2 microglobulin level was up to 8.06, hemoglobin has been dropping gradually but steadily and he was significantly hypogammaglobulinemic. His LDH was elevated. His lymphocyte count reached 175,000 and he had developed diffuse lymphadenopathy. He become symptomatic. We treated him with 3 cycles of fludarabine, Cytoxan, and Rituxan. He then developed significant pancytopenia requiring discontinuation of therapy. He has not require therapy since. Imbruvica started for clinical findings demonstrating progressive CLL on 06/22/2013, however, medication placed on hold for suspected Imbruvica-induced violaceous rash of right posterior leg.    CLL (chronic lymphocytic leukemia)   06/22/2013 - 06/29/2013 Chemotherapy Imbruvica daily   06/29/2013 Adverse Reaction Large right posterior leg (popliteal) ecchymosis/violaceous rash.  Likely Imbruvica-induced   I personally reviewed and went over laboratory results with the patient.  The results are noted within this dictation.  The patient's violaceous rash is very likely secondary to Imbruvica although there is no pictures available illustrated in the Imbruvica induced rash. It is significantly improved today on physical examination. It is a yellow weighing, ecchymosis that is about the size of a quarter in the popliteal region of his right leg.  Provided patient education regarding her next treatment option which is Zydelig.  We will start low dose is at 150 mg daily for 7 days and then increase to 150 mg  twice daily. I printed the patient information regarding this medication. He'll require chemotherapy teaching in the future prior to initiating this therapy. We'll get this scheduled with our nurse educator. We briefly reviewed the patient information regarding the medication.  He is agreeable to start this new treatment modality.  Hematologically, the patient denies any complaints and ROS questioning is negative.  Past Medical History  Diagnosis Date  . Diabetes mellitus   . Leukemia 6.22.2012    PER PATIENT  . Hypertension   . Ulcer   . Anemia   . CLL (chronic lymphocytic leukemia) 01/24/2011  . Melanoma in situ 01/24/2011  . DM (diabetes mellitus) 01/24/2011  . Mild obesity 01/24/2011  . Eczema 01/24/2011  . Port catheter in place 05/02/2012    has CLL (chronic lymphocytic leukemia); Melanoma in situ; DM (diabetes mellitus); Mild obesity; Eczema; and Port catheter in place on his problem list.     is allergic to imbruvica; aspirin; and ibuprofen.  Mr. Soth had no medications administered during this visit.  Past Surgical History  Procedure Laterality Date  . Hernia repair  2001  . Bleeding ulcer    . Portacath placement  2008  . Cataract extraction w/phaco  05/05/2012    Procedure: CATARACT EXTRACTION PHACO AND INTRAOCULAR LENS PLACEMENT (IOC);  Surgeon: Tonny Branch, MD;  Location: [facility];  Service: Ophthalmology;  Laterality: Right;  CDE:13.25  . Cataract extraction w/phaco  05/15/2012    Procedure: CATARACT EXTRACTION PHACO AND INTRAOCULAR LENS PLACEMENT (IOC);  Surgeon: Tonny Branch, MD;  Location: [facility];  Service: Ophthalmology;  Laterality: Left;  CDE:  12.32    Denies any headaches, dizziness, double vision, fevers, chills, night sweats, nausea, vomiting, diarrhea, constipation, chest pain,  heart palpitations, shortness of breath, blood in stool, black tarry stool, urinary pain, urinary burning, urinary frequency, hematuria.   PHYSICAL EXAMINATION  ECOG PERFORMANCE  STATUS: 0 - Asymptomatic  Filed Vitals:   07/13/13 1100  BP: 146/81  Pulse: 66  Temp: 97.6 F (36.4 C)  Resp: 18    GENERAL:alert, no distress, well nourished, well developed, comfortable, cooperative, obese and smiling SKIN: skin color, texture, turgor are normal, no rashes or significant lesions HEAD: Normocephalic, No masses, lesions, tenderness or abnormalities EYES: normal, PERRLA, EOMI, Conjunctiva are pink and non-injected EARS: External ears normal OROPHARYNX:mucous membranes are moist  NECK: supple, trachea midline LYMPH:  not examined BREAST:not examined LUNGS: clear to auscultation  HEART: regular rate & rhythm, no murmurs and no gallops ABDOMEN:abdomen soft, non-tender and normal bowel sounds BACK: Back symmetric, no curvature. EXTREMITIES:less then 2 second capillary refill, no joint deformities, effusion, or inflammation, no skin discoloration, no clubbing, no cyanosis, positive findings:  Right popliteal ecchymosis that is shelling in color and resolving. Approximately the size of a quarter  NEURO: alert & oriented x 3 with fluent speech, no focal motor/sensory deficits, gait normal    LABORATORY DATA: CBC    Component Value Date/Time   WBC PENDING 07/13/2013 1104   RBC 2.90* 07/13/2013 1104   HGB 10.5* 07/13/2013 1104   HCT 31.8* 07/13/2013 1104   PLT 168 07/13/2013 1104   MCV 109.7* 07/13/2013 1104   MCH 36.2* 07/13/2013 1104   MCHC 33.0 07/13/2013 1104   RDW 15.9* 07/13/2013 1104   LYMPHSABS PENDING 07/13/2013 1104   MONOABS PENDING 07/13/2013 1104   EOSABS PENDING 07/13/2013 1104   BASOSABS PENDING 07/13/2013 1104      Chemistry      Component Value Date/Time   NA 136* 06/18/2013 0854   K 4.5 06/18/2013 0854   CL 99 06/18/2013 0854   CO2 23 06/18/2013 0854   BUN 23 06/18/2013 0854   CREATININE 2.23* 06/18/2013 0854      Component Value Date/Time   CALCIUM 8.9 06/18/2013 0854   ALKPHOS 74 06/18/2013 0854   AST 22 06/18/2013 0854   ALT 16 06/18/2013 0854   BILITOT 0.4 06/18/2013 0854          ASSESSMENT:  1. CLL (without 11q or 17 p deletion) presenting with stage 0 disease in October 2006 however by the end of June 2010 his white count risen to 180,000, beta-2 microglobulin level was up to 8.06, hemoglobin has been dropping gradually but steadily and he was significantly hypogammaglobulinemic. His LDH was elevated. His lymphocyte count reached 175,000 and he had developed diffuse lymphadenopathy. He become symptomatic. We treated him with 3 cycles of fludarabine, Cytoxan, and Rituxan. He then developed significant pancytopenia requiring discontinuation of therapy. He has not require therapy since. Imbruvica started for clinical findings demonstrating progressive CLL on 06/22/2013, however, medication placed on hold for suspected Imbruvica-induced violaceous rash of right posterior leg. 2. Suspected Imbruvica-induced violaceous rash of posterior right leg.  Patient Active Problem List   Diagnosis Date Noted  . Port catheter in place 05/02/2012  . CLL (chronic lymphocytic leukemia) 01/24/2011  . Melanoma in situ 01/24/2011  . DM (diabetes mellitus) 01/24/2011  . Mild obesity 01/24/2011  . Eczema 01/24/2011     PLAN:  1. I personally reviewed and went over laboratory results with the patient.  The results are noted within this dictation. 2. Patient education regarding Imbruvica-induced rash.   3. Imbruvica added to allergy list 4. Chemotherapy teaching needed for  new prescription 5. Idelalisib 150 mg daily x 7 days, then 150 mg BID 6. Idelalisib Rx written and given to Lendell Caprice, prior authorization specialist 7. D/C Imbruvica and he will bring the pill bottle to [place] for proper disposal. 8. Cancel lab work 9. Return in 3 weeks for follow-up   THERAPY PLAN:  We will switch treatment to Idelalisib.  I suspect it'll take [place] approximately 2 weeks to get this medication approved. In the interim, we will get him scheduled for chemotherapy teaching. This will also allow  time for resolution of his violaceous rash in the right popliteal region secondary to [place]. You continue with his allopurinol and complete that prescription in approximately 6-8 weeks.  All questions were answered. The patient knows to call the clinic with any problems, questions or concerns. We can certainly see the patient much sooner if necessary.  Patient and plan discussed with Dr. Farrel Gobble and he is in agreement with the aforementioned.   KEFALAS,THOMAS

## 2013-07-13 ENCOUNTER — Encounter (HOSPITAL_COMMUNITY): Payer: Self-pay | Admitting: Oncology

## 2013-07-13 ENCOUNTER — Other Ambulatory Visit (HOSPITAL_COMMUNITY): Payer: Medicare Other

## 2013-07-13 ENCOUNTER — Encounter (HOSPITAL_COMMUNITY): Payer: Medicare Other

## 2013-07-13 ENCOUNTER — Encounter (HOSPITAL_COMMUNITY): Payer: Medicare Other | Attending: Oncology | Admitting: Oncology

## 2013-07-13 VITALS — BP 146/81 | HR 66 | Temp 97.6°F | Resp 18 | Wt 141.7 lb

## 2013-07-13 DIAGNOSIS — R21 Rash and other nonspecific skin eruption: Secondary | ICD-10-CM

## 2013-07-13 DIAGNOSIS — C9110 Chronic lymphocytic leukemia of B-cell type not having achieved remission: Secondary | ICD-10-CM

## 2013-07-13 LAB — CBC WITH DIFFERENTIAL/PLATELET
Basophils Relative: 0 % (ref 0–1)
Eosinophils Relative: 1 % (ref 0–5)
HCT: 31.8 % — ABNORMAL LOW (ref 39.0–52.0)
Hemoglobin: 10.5 g/dL — ABNORMAL LOW (ref 13.0–17.0)
LYMPHS PCT: 90 % — AB (ref 12–46)
MCH: 36.2 pg — AB (ref 26.0–34.0)
MCHC: 33 g/dL (ref 30.0–36.0)
MCV: 109.7 fL — ABNORMAL HIGH (ref 78.0–100.0)
Monocytes Relative: 3 % (ref 3–12)
Neutrophils Relative %: 6 % — ABNORMAL LOW (ref 43–77)
PLATELETS: 168 10*3/uL (ref 150–400)
RBC: 2.9 MIL/uL — AB (ref 4.22–5.81)
RDW: 15.9 % — ABNORMAL HIGH (ref 11.5–15.5)
WBC: 59.3 10*3/uL (ref 4.0–10.5)

## 2013-07-13 MED ORDER — IDELALISIB 150 MG PO TABS: ORAL_TABLET | ORAL | Status: DC

## 2013-07-13 NOTE — Progress Notes (Signed)
CRITICAL VALUE ALERT  Critical value received:  WBC 59.3  Date of notification:  07/13/13  Time of notification:  1200  Critical value read back:yes  Nurse who received alert:  A. Ouida Sills, RN  MD notified:  Robynn Pane, PA-C  Time of first page:  1210

## 2013-07-13 NOTE — Patient Instructions (Signed)
[facility] Discharge Instructions  RECOMMENDATIONS MADE BY THE CONSULTANT AND ANY TEST RESULTS WILL BE SENT TO YOUR REFERRING PHYSICIAN.  EXAM FINDINGS BY THE PHYSICIAN TODAY AND SIGNS OR SYMPTOMS TO REPORT TO CLINIC OR PRIMARY PHYSICIAN: Exam and findings as discussed by Robynn Pane, PA-C.  Will be switching you to Imbruvica and start you Idelaisib.  You will need teaching and Lupita Raider will contact you.  MEDICATIONS PRESCRIBED:  Idelalisib 150mg  daily for 7 days then 150 mg twice daily.  INSTRUCTIONS/FOLLOW-UP: Follow up in 3 weeks.  Thank you for choosing [facility] to provide your oncology and hematology care.  To afford each patient quality time with our providers, please arrive at least 15 minutes before your scheduled appointment time.  With your help, our goal is to use those 15 minutes to complete the necessary work-up to ensure our physicians have the information they need to help with your evaluation and healthcare recommendations.    Effective January 1st, 2014, we ask that you re-schedule your appointment with our physicians should you arrive 10 or more minutes late for your appointment.  We strive to give you quality time with our providers, and arriving late affects you and other patients whose appointments are after yours.    Again, thank you for choosing [facility].  Our hope is that these requests will decrease the amount of time that you wait before being seen by our physicians.       _____________________________________________________________  Should you have questions after your visit to [facility], please contact our office at [phone] between the hours of 8:30 a.m. and 5:00 p.m.  Voicemails left after 4:30 p.m. will not be returned until the following business day.  For prescription refill requests, have your pharmacy contact our office with your prescription refill request.

## 2013-07-16 ENCOUNTER — Ambulatory Visit (HOSPITAL_COMMUNITY): Payer: Medicare Other | Admitting: Oncology

## 2013-07-16 NOTE — Patient Instructions (Addendum)
[facility]   CHEMOTHERAPY INSTRUCTIONS  Zydelig  -   Administration: Take 1 tablet daily x 7 days. Then increase to 1 tablet two times a day. You may take this medication with our without food. Swallow this tablet whole. Do not crush, chew, or split the tablet. Do not miss a dose of Zydelig. If you miss a dose of Zydelig by less than 6 hours, take the missed dose right away. Then take your next dose as usual. If you miss a dose of Zydelig by more than 6 hours, wait and take the next dose of Zydelig at your usual time.   Side Effects: Severe Skin Reactions such as painful sores or ulcers on your skin, lips, or in your mouth/severe rash with blisters or peeling skin (TELL us RIGHT AWAY!) Anaphylaxis (throat swelling/can't breathe) - call 911! Low white blood cell count (neutropenia). We will be checking your blood counts regularly during treatment with Zydelig. We need to know if you develop a fever greater than 100.5, chills, green/yellow mucus production, congestion, etc.   *The most common side effects include: fever, feeling tired, nausea, cough, abdominal pain, and chills.*  Serious Side Effects - fatal and/or serious liver toxicity, fatal and/or serious and severe diarrhea and inflammation of the colon, fatal and serious pneumonitis, fatal and serous intestinal perforation (tear in intestinal lining)  Please carry your Zydelig Patient Safety Information Card with you in your wallet and present it to a healthcare worker if need be.  We will be checking your liver function and complete blood count every 2 weeks x 3 months.   EDUCATIONAL MATERIALS GIVEN AND REVIEWED: Specific Instructions Sheets: Engineer, technical sales Patient Safety Information Card   SELF CARE ACTIVITIES WHILE ON CHEMOTHERAPY: Increase your fluid intake 48 hours prior to treatment and drink at least 2 quarts per day after treatment., No alcohol intake., No aspirin or other medications unless  approved by your oncologist., Eat foods that are light and easy to digest., Eat foods at cold or room temperature., No fried, fatty, or spicy foods immediately before or after treatment., Have teeth cleaned professionally before starting treatment. Keep dentures and partial plates clean., Use soft toothbrush and do not use mouthwashes that contain alcohol. Biotene is a good mouthwash that is available at most pharmacies or may be ordered by calling [phone]., Use warm salt water gargles (1 teaspoon salt per 1 quart warm water) before and after meals and at bedtime. Or you may rinse with 2 tablespoons of three -percent hydrogen peroxide mixed in eight ounces of water., Always use sunscreen with SPF (Sun Protection Factor) of 30 or higher., Use your nausea medication as directed to prevent nausea. and Use your stool softener or laxative as directed to prevent constipation.  Please wash your hands for at least 30 seconds using warm soapy water. Handwashing is the #1 way to prevent the spread of germs. Stay away from sick people or people who are getting over a cold. If you develop respiratory systems such as green/yellow mucus production or productive cough or persistent cough let us know and we will see if you need an antibiotic. It is a good idea to keep a pair of gloves on when going into grocery stores/[employer] to decrease your risk of coming into contact with germs on the carts, etc. Carry alcohol hand gel with you at all times and use it frequently if out in public. All foods need to be cooked thoroughly. No raw  foods. No medium or undercooked meats, eggs. If your food is cooked medium well, it does not need to be hot pink or saturated with bloody liquid at all. Vegetables and fruits need to be washed/rinsed under the faucet with a dish detergent before being consumed. You can eat raw fruits and vegetables unless we tell you otherwise but it would be best if you cooked them or bought frozen. Do not eat off  of salad bars or hot bars unless you really trust the cleanliness of the restaurant. If you need dental work, please let us know before you go for your appointment so that we can coordinate the best possible time for you in regards to your chemo regimen. You need to also let your dentist know that you are actively taking chemo. We may need to do labs prior to your dental appointment. We also want your bowels moving at least every other day. If this is not happening, we need to know so that we can get you on a bowel regimen to help you go.     MEDICATIONS: You have been given prescriptions for the following medications:  Zydelig 150mg  tablet. Take 1 tablet daily x 7 days. Then increase to 1 tablet two times a day.    Over-the-Counter Meds:  Colace - this is a stool softener. Take 100mg  capsule 2-6 times a day as needed. If you have to take more than 6 capsules of Colace a day call the [facility].  Senna - this is a mild laxative used to treat mild constipation. May take 2 tabs by mouth daily or up to twice a day as needed for mild constipation.  Milk of Magnesia - this is a laxative used to treat moderate to severe constipation. May take 2-4 tablespoons every 8 hours as needed. May increase to 8 tablespoons x 1 dose and if no bowel movement call the [facility].  Imodium - this is for diarrhea. Take 2 tabs after 1st loose stool and then 1 tab after each loose stool until you go a total of 12 hours without a loose stool. Call [facility] if loose stools continue.   SYMPTOMS TO REPORT AS SOON AS POSSIBLE AFTER TREATMENT:  FEVER GREATER THAN 100.5 F  CHILLS WITH OR WITHOUT FEVER  NAUSEA AND VOMITING THAT IS NOT CONTROLLED WITH YOUR NAUSEA MEDICATION  UNUSUAL SHORTNESS OF BREATH  UNUSUAL BRUISING OR BLEEDING  TENDERNESS IN MOUTH AND THROAT WITH OR WITHOUT PRESENCE OF ULCERS  URINARY PROBLEMS  BOWEL PROBLEMS  UNUSUAL RASH    Wear comfortable clothing and clothing appropriate  for easy access to any Portacath or PICC line. Let us know if there is anything that we can do to make your therapy better!      I have been informed and understand all of the instructions given to me and have received a copy. I have been instructed to call the clinic [phone] or my family physician as soon as possible for continued medical care, if indicated. I do not have any more questions at this time but understand that I may call the [facility] or the Patient Navigator at [phone] during office hours should I have questions or need assistance in obtaining follow-up care.      _________________________________________      _______________     __________ Signature of Patient or Authorized Representative        Date  Time      _________________________________________ Nurse's Signature

## 2013-07-20 ENCOUNTER — Other Ambulatory Visit (HOSPITAL_COMMUNITY): Payer: Medicare Other

## 2013-07-22 ENCOUNTER — Encounter (HOSPITAL_BASED_OUTPATIENT_CLINIC_OR_DEPARTMENT_OTHER): Payer: Medicare Other

## 2013-07-22 ENCOUNTER — Encounter (HOSPITAL_COMMUNITY): Payer: Self-pay | Admitting: Oncology

## 2013-07-22 DIAGNOSIS — C9110 Chronic lymphocytic leukemia of B-cell type not having achieved remission: Secondary | ICD-10-CM

## 2013-07-22 NOTE — Progress Notes (Signed)
Chemo teaching done and consent signed for Zydelig. We are still awaiting prior authorization.

## 2013-07-27 ENCOUNTER — Other Ambulatory Visit (HOSPITAL_COMMUNITY): Payer: Medicare Other

## 2013-07-30 ENCOUNTER — Telehealth (HOSPITAL_COMMUNITY): Payer: Self-pay | Admitting: Oncology

## 2013-07-30 NOTE — Telephone Encounter (Signed)
FREE ZYDELIG APPRVD 07/29/13-07/29/14

## 2013-08-02 NOTE — Progress Notes (Signed)
[name], MD [place] [address]  CLL (chronic lymphocytic leukemia)  CURRENT THERAPY: Started Idelalisib on 07/31/13  INTERVAL HISTORY: Randall Reyes 78 y.o. male returns for  regular  visit for followup of CLL (without 11q or 17 p deletion) presenting with stage 0 disease in October 2006 however by the end of June 2010 his white count risen to 180,000, beta-2 microglobulin level was up to 8.06, hemoglobin has been dropping gradually but steadily and he was significantly hypogammaglobulinemic. His LDH was elevated. His lymphocyte count reached 175,000 and he had developed diffuse lymphadenopathy. He become symptomatic. We treated him with 3 cycles of fludarabine, Cytoxan, and Rituxan. He then developed significant pancytopenia requiring discontinuation of therapy. He has not require therapy since. Imbruvica started for clinical findings demonstrating progressive CLL on 06/22/2013, however, medication placed on hold for suspected Imbruvica-induced violaceous rash of right posterior leg.  Started Idelalisib on 07/31/2013.     CLL (chronic lymphocytic leukemia)   06/22/2013 - 06/29/2013 Chemotherapy Imbruvica daily   06/29/2013 Adverse Reaction Large right posterior leg (popliteal) ecchymosis/violaceous rash.  Likely Imbruvica-induced   07/31/2013 -  Chemotherapy Zydelig (idelalisib) monotherapy   I personally reviewed and went over laboratory results with the patient.  The results are noted within this dictation.  Thus far, he is tolerating Idelalisib without difficulties.  We discussed the possible side effects of this therapy.   We will monitor his labs per the PI.   He will increase his Idelalisib this coming Friday to BID dosing.    Hematologically, he denies any complaints and ROS questioning is negative.   Past Medical History  Diagnosis Date  . Diabetes mellitus   . Leukemia 6.22.2012    PER PATIENT  . Hypertension   . Ulcer   . Anemia   . CLL  (chronic lymphocytic leukemia) 01/24/2011  . Melanoma in situ 01/24/2011  . DM (diabetes mellitus) 01/24/2011  . Mild obesity 01/24/2011  . Eczema 01/24/2011  . Port catheter in place 05/02/2012    has CLL (chronic lymphocytic leukemia); Melanoma in situ; DM (diabetes mellitus); Mild obesity; Eczema; and Port catheter in place on his problem list.     is allergic to imbruvica; aspirin; and ibuprofen.  Mr. Sudol does not currently have medications on file.  Past Surgical History  Procedure Laterality Date  . Hernia repair  2001  . Bleeding ulcer    . Portacath placement  2008  . Cataract extraction w/phaco  05/05/2012    Procedure: CATARACT EXTRACTION PHACO AND INTRAOCULAR LENS PLACEMENT (IOC);  Surgeon: Tonny Branch, MD;  Location: [facility];  Service: Ophthalmology;  Laterality: Right;  CDE:13.25  . Cataract extraction w/phaco  05/15/2012    Procedure: CATARACT EXTRACTION PHACO AND INTRAOCULAR LENS PLACEMENT (IOC);  Surgeon: Tonny Branch, MD;  Location: [facility];  Service: Ophthalmology;  Laterality: Left;  CDE:  12.32    Denies any headaches, dizziness, double vision, fevers, chills, night sweats, nausea, vomiting, diarrhea, constipation, chest pain, heart palpitations, shortness of breath, blood in stool, black tarry stool, urinary pain, urinary burning, urinary frequency, hematuria.   PHYSICAL EXAMINATION  ECOG PERFORMANCE STATUS: 0 - Asymptomatic  Filed Vitals:   08/03/13 0946  BP: 149/69  Pulse: 57  Temp: 97.5 F (36.4 C)  Resp: 18    GENERAL:alert, healthy, no distress, well nourished, well developed, comfortable, cooperative and smiling SKIN: skin color, texture, turgor are normal, no rashes or significant lesions HEAD: Normocephalic, No masses, lesions, tenderness or  abnormalities EYES: normal, PERRLA, EOMI, Conjunctiva are pink and non-injected EARS: External ears normal OROPHARYNX:mucous membranes are moist  NECK: supple, trachea midline LYMPH:  not examined BREAST:not  examined LUNGS: not examined HEART: not examined ABDOMEN:not examined BACK: Back symmetric, no curvature. EXTREMITIES:less then 2 second capillary refill, no skin discoloration, no clubbing, no cyanosis  NEURO: alert & oriented x 3 with fluent speech, no focal motor/sensory deficits, gait normal   LABORATORY DATA: CBC    Component Value Date/Time   WBC 59.3* 07/13/2013 1104   RBC 2.90* 07/13/2013 1104   HGB 10.5* 07/13/2013 1104   HCT 31.8* 07/13/2013 1104   PLT 168 07/13/2013 1104   MCV 109.7* 07/13/2013 1104   MCH 36.2* 07/13/2013 1104   MCHC 33.0 07/13/2013 1104   RDW 15.9* 07/13/2013 1104   LYMPHSABS 47.3* 07/06/2013 0920   MONOABS 0.6 07/06/2013 0920   EOSABS 2.2* 07/06/2013 0920   BASOSABS 0.0 07/06/2013 0920      Chemistry      Component Value Date/Time   NA 136* 06/18/2013 0854   K 4.5 06/18/2013 0854   CL 99 06/18/2013 0854   CO2 23 06/18/2013 0854   BUN 23 06/18/2013 0854   CREATININE 2.23* 06/18/2013 0854      Component Value Date/Time   CALCIUM 8.9 06/18/2013 0854   ALKPHOS 74 06/18/2013 0854   AST 22 06/18/2013 0854   ALT 16 06/18/2013 0854   BILITOT 0.4 06/18/2013 0854       ASSESSMENT:  1. CLL (without 11q or 17 p deletion) presenting with stage 0 disease in October 2006 however by the end of June 2010 his white count risen to 180,000, beta-2 microglobulin level was up to 8.06, hemoglobin has been dropping gradually but steadily and he was significantly hypogammaglobulinemic. His LDH was elevated. His lymphocyte count reached 175,000 and he had developed diffuse lymphadenopathy. He become symptomatic. We treated him with 3 cycles of fludarabine, Cytoxan, and Rituxan. He then developed significant pancytopenia requiring discontinuation of therapy. He has not require therapy since. Imbruvica started for clinical findings demonstrating progressive CLL on 06/22/2013, however, medication placed on hold for suspected Imbruvica-induced violaceous rash of right posterior leg. Started Idelalisib on  07/31/2013.  2. Suspected Imbruvica-induced violaceous rash of posterior right leg.  Patient Active Problem List   Diagnosis Date Noted  . Port catheter in place 05/02/2012  . CLL (chronic lymphocytic leukemia) 01/24/2011  . Melanoma in situ 01/24/2011  . DM (diabetes mellitus) 01/24/2011  . Mild obesity 01/24/2011  . Eczema 01/24/2011    PLAN:  1. I personally reviewed and went over laboratory results with the patient.  The results are noted within this dictation. 2. Idelalisib 150 mg daily x 7 days, then 150 mg BID (every 12 hours). 3. Discussed the side effects of Idelalisib. 4. Labs every 2 weeks: CBC diff, CMET 5. Return in 2 weeks for follow-up.   THERAPY PLAN:  He started Idelalisib 150 mg daily on 07/31/2013.  On this coming Friday (08/07/2013), he will increase to 150 mg BID.  We will monitor CBC diff, CMET every 2 weeks x 3 months, then every 4 weeks x 3 months, and then every 1-3 months thereafter per the PI.  He is educated about the warnings and precautions including hepatotoxicity, severe diarrhea and colitis, pneumonitis, intestinal perforation, severe cutaneous rash, anaphylaxis, and neutropenia.  All questions were answered. The patient knows to call the clinic with any problems, questions or concerns. We can certainly see the patient much sooner  if necessary.  Patient and plan discussed with Dr. Farrel Gobble and he is in agreement with the aforementioned.   Pietro Bonura 08/03/2013

## 2013-08-03 ENCOUNTER — Encounter (HOSPITAL_BASED_OUTPATIENT_CLINIC_OR_DEPARTMENT_OTHER): Payer: Medicare Other | Admitting: Oncology

## 2013-08-03 ENCOUNTER — Encounter (HOSPITAL_COMMUNITY): Payer: Self-pay | Admitting: Oncology

## 2013-08-03 ENCOUNTER — Other Ambulatory Visit (HOSPITAL_COMMUNITY): Payer: Medicare Other

## 2013-08-03 ENCOUNTER — Encounter (HOSPITAL_BASED_OUTPATIENT_CLINIC_OR_DEPARTMENT_OTHER): Payer: Medicare Other

## 2013-08-03 VITALS — BP 149/69 | HR 57 | Temp 97.5°F | Resp 18 | Wt 143.4 lb

## 2013-08-03 DIAGNOSIS — C9110 Chronic lymphocytic leukemia of B-cell type not having achieved remission: Secondary | ICD-10-CM

## 2013-08-03 LAB — COMPREHENSIVE METABOLIC PANEL
ALT: 18 U/L (ref 0–53)
AST: 26 U/L (ref 0–37)
Albumin: 3.5 g/dL (ref 3.5–5.2)
Alkaline Phosphatase: 66 U/L (ref 39–117)
BUN: 21 mg/dL (ref 6–23)
CALCIUM: 8.7 mg/dL (ref 8.4–10.5)
CO2: 27 mEq/L (ref 19–32)
Chloride: 105 mEq/L (ref 96–112)
Creatinine, Ser: 1.54 mg/dL — ABNORMAL HIGH (ref 0.50–1.35)
GFR calc non Af Amer: 42 mL/min — ABNORMAL LOW (ref >90)
GFR, EST AFRICAN AMERICAN: 49 mL/min — AB (ref >90)
GLUCOSE: 107 mg/dL — AB (ref 70–99)
Potassium: 4.5 mEq/L (ref 3.7–5.3)
SODIUM: 141 meq/L (ref 137–147)
Total Bilirubin: 0.2 mg/dL — ABNORMAL LOW (ref 0.3–1.2)
Total Protein: 8.3 g/dL (ref 6.0–8.3)

## 2013-08-03 LAB — CBC WITH DIFFERENTIAL/PLATELET
BAND NEUTROPHILS: 0 % (ref 0–10)
Basophils Absolute: 0 10*3/uL (ref 0.0–0.1)
Basophils Relative: 0 % (ref 0–1)
Blasts: 0 %
EOS ABS: 0 10*3/uL (ref 0.0–0.7)
EOS PCT: 0 % (ref 0–5)
HEMATOCRIT: 32.6 % — AB (ref 39.0–52.0)
Hemoglobin: 10.5 g/dL — ABNORMAL LOW (ref 13.0–17.0)
Lymphocytes Relative: 100 % — ABNORMAL HIGH (ref 12–46)
Lymphs Abs: 56.1 10*3/uL — ABNORMAL HIGH (ref 0.7–4.0)
MCH: 36.1 pg — ABNORMAL HIGH (ref 26.0–34.0)
MCHC: 32.2 g/dL (ref 30.0–36.0)
MCV: 112 fL — AB (ref 78.0–100.0)
METAMYELOCYTES PCT: 0 %
Monocytes Absolute: 0 10*3/uL — ABNORMAL LOW (ref 0.1–1.0)
Monocytes Relative: 0 % — ABNORMAL LOW (ref 3–12)
Myelocytes: 0 %
Neutro Abs: 0 10*3/uL — ABNORMAL LOW (ref 1.7–7.7)
Neutrophils Relative %: 0 % — ABNORMAL LOW (ref 43–77)
Platelets: 164 10*3/uL (ref 150–400)
Promyelocytes Absolute: 0 %
RBC: 2.91 MIL/uL — ABNORMAL LOW (ref 4.22–5.81)
RDW: 16.4 % — ABNORMAL HIGH (ref 11.5–15.5)
WBC: 56.1 10*3/uL — AB (ref 4.0–10.5)
nRBC: 0 /100 WBC

## 2013-08-03 NOTE — Patient Instructions (Signed)
[facility] Discharge Instructions  RECOMMENDATIONS MADE BY THE CONSULTANT AND ANY TEST RESULTS WILL BE SENT TO YOUR REFERRING PHYSICIAN.  EXAM FINDINGS BY THE PHYSICIAN TODAY AND SIGNS OR SYMPTOMS TO REPORT TO CLINIC OR PRIMARY PHYSICIAN: Exam and findings as discussed by Robynn Pane, PA-C.  Report fevers, increased diarrhea, persistent cough, yellowing of skin, etc.  MEDICATIONS PRESCRIBED:  When increasing Zydelig to twice daily take it 12 hours apart.  INSTRUCTIONS/FOLLOW-UP: Follow-up in 2 weeks.  Thank you for choosing [facility] to provide your oncology and hematology care.  To afford each patient quality time with our providers, please arrive at least 15 minutes before your scheduled appointment time.  With your help, our goal is to use those 15 minutes to complete the necessary work-up to ensure our physicians have the information they need to help with your evaluation and healthcare recommendations.    Effective January 1st, 2014, we ask that you re-schedule your appointment with our physicians should you arrive 10 or more minutes late for your appointment.  We strive to give you quality time with our providers, and arriving late affects you and other patients whose appointments are after yours.    Again, thank you for choosing [facility].  Our hope is that these requests will decrease the amount of time that you wait before being seen by our physicians.       _____________________________________________________________  Should you have questions after your visit to [facility], please contact our office at [phone] between the hours of 8:30 a.m. and 5:00 p.m.  Voicemails left after 4:30 p.m. will not be returned until the following business day.  For prescription refill requests, have your pharmacy contact our office with your prescription refill request.

## 2013-08-03 NOTE — Progress Notes (Signed)
Labs drawn today for cbc/diff,cmp 

## 2013-08-15 NOTE — Progress Notes (Signed)
[name], MD [place] [address]  CLL (chronic lymphocytic leukemia)  CURRENT THERAPY:  Started Idelalisib 150 mg daily on 07/31/13 x 7 days and then Idelalisib 150 mg BID beginning on 08/07/2013.  INTERVAL HISTORY: DAMARRION MIMBS 78 y.o. male returns for  regular  visit for followup of CLL (without 11q or 17 p deletion) presenting with stage 0 disease in October 2006 however by the end of June 2010 his white count risen to 180,000, beta-2 microglobulin level was up to 8.06, hemoglobin has been dropping gradually but steadily and he was significantly hypogammaglobulinemic. His LDH was elevated. His lymphocyte count reached 175,000 and he had developed diffuse lymphadenopathy. He become symptomatic. We treated him with 3 cycles of fludarabine, Cytoxan, and Rituxan. He then developed significant pancytopenia requiring discontinuation of therapy. He has not require therapy since. Imbruvica started for clinical findings demonstrating progressive CLL on 06/22/2013, however, medication placed on hold for suspected Imbruvica-induced violaceous rash of right posterior leg. Started Idelalisib on 07/31/2013.     CLL (chronic lymphocytic leukemia)   06/22/2013 - 06/29/2013 Chemotherapy Imbruvica daily   06/29/2013 Adverse Reaction Large right posterior leg (popliteal) ecchymosis/violaceous rash.  Likely Imbruvica-induced   07/31/2013 -  Chemotherapy Zydelig (idelalisib) monotherapy    I personally reviewed and went over laboratory results with the patient.  The results are noted within this dictation.  Thus far, he is tolerating Idelalisib without difficulties. We discussed the possible side effects of this therapy. We will monitor his labs per the PI.   He reports that his appetite has improved and compared to last year, his energy level is significantly improved per reports from the patient and his wife.    He denies any complaints.  Hematologically, he denies any complaints  and ROS questioning is negative.  He is tolerating Zydelig without any issues or side effects.   Past Medical History  Diagnosis Date  . Diabetes mellitus   . Leukemia 6.22.2012    PER PATIENT  . Hypertension   . Ulcer   . Anemia   . CLL (chronic lymphocytic leukemia) 01/24/2011  . Melanoma in situ 01/24/2011  . DM (diabetes mellitus) 01/24/2011  . Mild obesity 01/24/2011  . Eczema 01/24/2011  . Port catheter in place 05/02/2012    has CLL (chronic lymphocytic leukemia); Melanoma in situ; DM (diabetes mellitus); Mild obesity; Eczema; and Port catheter in place on his problem list.     is allergic to imbruvica; aspirin; and ibuprofen.  Mr. Cinque does not currently have medications on file.  Past Surgical History  Procedure Laterality Date  . Hernia repair  2001  . Bleeding ulcer    . Portacath placement  2008  . Cataract extraction w/phaco  05/05/2012    Procedure: CATARACT EXTRACTION PHACO AND INTRAOCULAR LENS PLACEMENT (IOC);  Surgeon: Tonny Branch, MD;  Location: [facility];  Service: Ophthalmology;  Laterality: Right;  CDE:13.25  . Cataract extraction w/phaco  05/15/2012    Procedure: CATARACT EXTRACTION PHACO AND INTRAOCULAR LENS PLACEMENT (IOC);  Surgeon: Tonny Branch, MD;  Location: [facility];  Service: Ophthalmology;  Laterality: Left;  CDE:  12.32    Denies any headaches, dizziness, double vision, fevers, chills, night sweats, nausea, vomiting, diarrhea, constipation, chest pain, heart palpitations, shortness of breath, blood in stool, black tarry stool, urinary pain, urinary burning, urinary frequency, hematuria.   PHYSICAL EXAMINATION  ECOG PERFORMANCE STATUS: 0 - Asymptomatic  Filed Vitals:   08/18/13 0900  BP: 143/67  Pulse: 63  Temp: 98.7 F (37.1 C)  Resp: 18    GENERAL:alert, no distress, well nourished, well developed, comfortable, cooperative and smiling SKIN: skin color, texture, turgor are normal, no rashes or significant lesions HEAD: Normocephalic, No  masses, lesions, tenderness or abnormalities EYES: normal, PERRLA, EOMI, Conjunctiva are pink and non-injected EARS: External ears normal OROPHARYNX:mucous membranes are moist  NECK: supple, no adenopathy, thyroid normal size, non-tender, without nodularity, no stridor, non-tender, trachea midline LYMPH:  no palpable lymphadenopathy BREAST:not examined LUNGS: clear to auscultation and percussion HEART: regular rate & rhythm, no murmurs, no gallops, S1 normal and S2 normal ABDOMEN:abdomen soft, non-tender and normal bowel sounds BACK: Back symmetric, no curvature. EXTREMITIES:less then 2 second capillary refill, no joint deformities, effusion, or inflammation, no skin discoloration, no clubbing, no cyanosis  NEURO: alert & oriented x 3 with fluent speech, no focal motor/sensory deficits, gait normal   LABORATORY DATA: CBC    Component Value Date/Time   WBC 55.8* 08/17/2013 0907   RBC 3.09* 08/17/2013 0907   HGB 11.1* 08/17/2013 0907   HCT 34.7* 08/17/2013 0907   PLT 161 08/17/2013 0907   MCV 112.3* 08/17/2013 0907   MCH 35.9* 08/17/2013 0907   MCHC 32.0 08/17/2013 0907   RDW 16.2* 08/17/2013 0907   LYMPHSABS 51.7* 08/17/2013 0907   MONOABS 1.3* 08/17/2013 0907   EOSABS 0.6 08/17/2013 0907   BASOSABS 0.1 08/17/2013 0907      Chemistry      Component Value Date/Time   NA 142 08/17/2013 0907   K 4.8 08/17/2013 0907   CL 107 08/17/2013 0907   CO2 25 08/17/2013 0907   BUN 20 08/17/2013 0907   CREATININE 1.43* 08/17/2013 0907      Component Value Date/Time   CALCIUM 8.7 08/17/2013 0907   ALKPHOS 66 08/17/2013 0907   AST 34 08/17/2013 0907   ALT 36 08/17/2013 0907   BILITOT 0.2* 08/17/2013 0907        ASSESSMENT:  1. CLL (without 11q or 17 p deletion) presenting with stage 0 disease in October 2006 however by the end of June 2010 his white count risen to 180,000, beta-2 microglobulin level was up to 8.06, hemoglobin has been dropping gradually but steadily and he was significantly hypogammaglobulinemic. His LDH was  elevated. His lymphocyte count reached 175,000 and he had developed diffuse lymphadenopathy. He become symptomatic. We treated him with 3 cycles of fludarabine, Cytoxan, and Rituxan. He then developed significant pancytopenia requiring discontinuation of therapy. He has not require therapy since. Imbruvica started for clinical findings demonstrating progressive CLL on 06/22/2013, however, medication placed on hold for suspected Imbruvica-induced violaceous rash of right posterior leg. Started Idelalisib on 07/31/2013.  2. Suspected Imbruvica-induced violaceous rash of posterior right leg.  Patient Active Problem List   Diagnosis Date Noted  . Port catheter in place 05/02/2012  . CLL (chronic lymphocytic leukemia) 01/24/2011  . Melanoma in situ 01/24/2011  . DM (diabetes mellitus) 01/24/2011  . Mild obesity 01/24/2011  . Eczema 01/24/2011     PLAN:  1. I personally reviewed and went over laboratory results with the patient.  The results are noted within this dictation. 2. Continue Idelalisib 150 mg BID (every 12 hours). 3. Labs every 2 weeks: CBC diff, CMET x 3 months (June 2015) 4. Return in 4 weeks for follow-up   THERAPY PLAN:  He started Idelalisib 150 mg daily on 07/31/2013. On 08/07/2013, he increased the dose to 150 mg BID (maximum dose). We will monitor CBC diff, CMET every 2  weeks x 3 months (finishing in June 2015), then every 4 weeks x 3 months (finishing in August 2015) , and then every 1-3 months thereafter per the PI. He is educated about the warnings and precautions including hepatotoxicity, severe diarrhea and colitis, pneumonitis, intestinal perforation, severe cutaneous rash, anaphylaxis, and neutropenia.  All questions were answered. The patient knows to call the clinic with any problems, questions or concerns. We can certainly see the patient much sooner if necessary.  Patient and plan discussed with Dr. Farrel Gobble and he is in agreement with the aforementioned.    Briya Lookabaugh 08/18/2013

## 2013-08-17 ENCOUNTER — Telehealth (HOSPITAL_COMMUNITY): Payer: Self-pay

## 2013-08-17 ENCOUNTER — Encounter (HOSPITAL_COMMUNITY): Payer: Medicare Other | Attending: Oncology

## 2013-08-17 DIAGNOSIS — C9110 Chronic lymphocytic leukemia of B-cell type not having achieved remission: Secondary | ICD-10-CM | POA: Diagnosis present

## 2013-08-17 LAB — COMPREHENSIVE METABOLIC PANEL
ALK PHOS: 66 U/L (ref 39–117)
ALT: 36 U/L (ref 0–53)
AST: 34 U/L (ref 0–37)
Albumin: 3.4 g/dL — ABNORMAL LOW (ref 3.5–5.2)
BUN: 20 mg/dL (ref 6–23)
CALCIUM: 8.7 mg/dL (ref 8.4–10.5)
CO2: 25 meq/L (ref 19–32)
Chloride: 107 mEq/L (ref 96–112)
Creatinine, Ser: 1.43 mg/dL — ABNORMAL HIGH (ref 0.50–1.35)
GFR calc non Af Amer: 46 mL/min — ABNORMAL LOW (ref >90)
GFR, EST AFRICAN AMERICAN: 53 mL/min — AB (ref >90)
Glucose, Bld: 134 mg/dL — ABNORMAL HIGH (ref 70–99)
Potassium: 4.8 mEq/L (ref 3.7–5.3)
SODIUM: 142 meq/L (ref 137–147)
Total Bilirubin: 0.2 mg/dL — ABNORMAL LOW (ref 0.3–1.2)
Total Protein: 8.1 g/dL (ref 6.0–8.3)

## 2013-08-17 LAB — CBC WITH DIFFERENTIAL/PLATELET
BASOS PCT: 0 % (ref 0–1)
Basophils Absolute: 0.1 10*3/uL (ref 0.0–0.1)
Eosinophils Absolute: 0.6 10*3/uL (ref 0.0–0.7)
Eosinophils Relative: 1 % (ref 0–5)
HEMATOCRIT: 34.7 % — AB (ref 39.0–52.0)
HEMOGLOBIN: 11.1 g/dL — AB (ref 13.0–17.0)
LYMPHS PCT: 93 % — AB (ref 12–46)
Lymphs Abs: 51.7 10*3/uL — ABNORMAL HIGH (ref 0.7–4.0)
MCH: 35.9 pg — ABNORMAL HIGH (ref 26.0–34.0)
MCHC: 32 g/dL (ref 30.0–36.0)
MCV: 112.3 fL — ABNORMAL HIGH (ref 78.0–100.0)
MONO ABS: 1.3 10*3/uL — AB (ref 0.1–1.0)
Monocytes Relative: 2 % — ABNORMAL LOW (ref 3–12)
NEUTROS ABS: 2.2 10*3/uL (ref 1.7–7.7)
Neutrophils Relative %: 4 % — ABNORMAL LOW (ref 43–77)
Platelets: 161 10*3/uL (ref 150–400)
RBC: 3.09 MIL/uL — ABNORMAL LOW (ref 4.22–5.81)
RDW: 16.2 % — ABNORMAL HIGH (ref 11.5–15.5)
WBC: 55.8 10*3/uL (ref 4.0–10.5)

## 2013-08-17 NOTE — Telephone Encounter (Signed)
CRITICAL VALUE ALERT Critical value received:  Wbc 55,800  Date of notification:  08/17/2013 Time of notification: 1100 Critical value read back:  yes Nurse who received alert:  ty MD notified (1st page):  Kirby Crigler pa notified of current labs. No new orders obtained.

## 2013-08-17 NOTE — Progress Notes (Signed)
Labs drawn today for cbc/diff,cmp 

## 2013-08-18 ENCOUNTER — Encounter (HOSPITAL_COMMUNITY): Payer: Self-pay | Admitting: Oncology

## 2013-08-18 ENCOUNTER — Encounter (HOSPITAL_BASED_OUTPATIENT_CLINIC_OR_DEPARTMENT_OTHER): Payer: Medicare Other | Admitting: Oncology

## 2013-08-18 VITALS — BP 143/67 | HR 63 | Temp 98.7°F | Resp 18 | Wt 143.0 lb

## 2013-08-18 DIAGNOSIS — L988 Other specified disorders of the skin and subcutaneous tissue: Secondary | ICD-10-CM

## 2013-08-18 DIAGNOSIS — E669 Obesity, unspecified: Secondary | ICD-10-CM

## 2013-08-18 DIAGNOSIS — C9110 Chronic lymphocytic leukemia of B-cell type not having achieved remission: Secondary | ICD-10-CM

## 2013-08-18 DIAGNOSIS — C9112 Chronic lymphocytic leukemia of B-cell type in relapse: Secondary | ICD-10-CM

## 2013-08-18 DIAGNOSIS — E119 Type 2 diabetes mellitus without complications: Secondary | ICD-10-CM

## 2013-08-18 NOTE — Patient Instructions (Signed)
[facility] Discharge Instructions  RECOMMENDATIONS MADE BY THE CONSULTANT AND ANY TEST RESULTS WILL BE SENT TO YOUR REFERRING PHYSICIAN. We will see you every 2 weeks for labs. You will see Randall Reyes in 1 month for a doctor visit.  Thank you for choosing [facility] to provide your oncology and hematology care.  To afford each patient quality time with our providers, please arrive at least 15 minutes before your scheduled appointment time.  With your help, our goal is to use those 15 minutes to complete the necessary work-up to ensure our physicians have the information they need to help with your evaluation and healthcare recommendations.    Effective January 1st, 2014, we ask that you re-schedule your appointment with our physicians should you arrive 10 or more minutes late for your appointment.  We strive to give you quality time with our providers, and arriving late affects you and other patients whose appointments are after yours.    Again, thank you for choosing [facility].  Our hope is that these requests will decrease the amount of time that you wait before being seen by our physicians.       _____________________________________________________________  Should you have questions after your visit to [facility], please contact our office at [phone] between the hours of 8:30 a.m. and 5:00 p.m.  Voicemails left after 4:30 p.m. will not be returned until the following business day.  For prescription refill requests, have your pharmacy contact our office with your prescription refill request.

## 2013-08-31 ENCOUNTER — Encounter (HOSPITAL_BASED_OUTPATIENT_CLINIC_OR_DEPARTMENT_OTHER): Payer: Medicare Other

## 2013-08-31 DIAGNOSIS — C9110 Chronic lymphocytic leukemia of B-cell type not having achieved remission: Secondary | ICD-10-CM

## 2013-08-31 DIAGNOSIS — C9112 Chronic lymphocytic leukemia of B-cell type in relapse: Secondary | ICD-10-CM

## 2013-08-31 LAB — COMPREHENSIVE METABOLIC PANEL
ALK PHOS: 69 U/L (ref 39–117)
ALT: 40 U/L (ref 0–53)
AST: 38 U/L — ABNORMAL HIGH (ref 0–37)
Albumin: 3.5 g/dL (ref 3.5–5.2)
BUN: 19 mg/dL (ref 6–23)
CALCIUM: 8.8 mg/dL (ref 8.4–10.5)
CO2: 26 mEq/L (ref 19–32)
Chloride: 102 mEq/L (ref 96–112)
Creatinine, Ser: 1.41 mg/dL — ABNORMAL HIGH (ref 0.50–1.35)
GFR calc Af Amer: 54 mL/min — ABNORMAL LOW (ref >90)
GFR calc non Af Amer: 47 mL/min — ABNORMAL LOW (ref >90)
Glucose, Bld: 173 mg/dL — ABNORMAL HIGH (ref 70–99)
Potassium: 4.2 mEq/L (ref 3.7–5.3)
SODIUM: 139 meq/L (ref 137–147)
TOTAL PROTEIN: 8 g/dL (ref 6.0–8.3)
Total Bilirubin: 0.2 mg/dL — ABNORMAL LOW (ref 0.3–1.2)

## 2013-08-31 LAB — CBC WITH DIFFERENTIAL/PLATELET
BASOS ABS: 0.2 10*3/uL — AB (ref 0.0–0.1)
BASOS PCT: 0 % (ref 0–1)
EOS ABS: 0.4 10*3/uL (ref 0.0–0.7)
Eosinophils Relative: 1 % (ref 0–5)
HCT: 33.8 % — ABNORMAL LOW (ref 39.0–52.0)
HEMOGLOBIN: 11.5 g/dL — AB (ref 13.0–17.0)
Lymphocytes Relative: 94 % — ABNORMAL HIGH (ref 12–46)
Lymphs Abs: 63.4 10*3/uL — ABNORMAL HIGH (ref 0.7–4.0)
MCH: 36.9 pg — ABNORMAL HIGH (ref 26.0–34.0)
MCHC: 34 g/dL (ref 30.0–36.0)
MCV: 108.3 fL — ABNORMAL HIGH (ref 78.0–100.0)
Monocytes Absolute: 1.2 10*3/uL — ABNORMAL HIGH (ref 0.1–1.0)
Monocytes Relative: 2 % — ABNORMAL LOW (ref 3–12)
NEUTROS PCT: 3 % — AB (ref 43–77)
Neutro Abs: 2.2 10*3/uL (ref 1.7–7.7)
Platelets: 153 10*3/uL (ref 150–400)
RBC: 3.12 MIL/uL — ABNORMAL LOW (ref 4.22–5.81)
RDW: 15.6 % — AB (ref 11.5–15.5)
WBC: 67.4 10*3/uL (ref 4.0–10.5)

## 2013-08-31 MED ORDER — IDELALISIB 150 MG PO TABS: ORAL_TABLET | ORAL | Status: DC

## 2013-08-31 MED ORDER — IDELALISIB 150 MG PO TABS
150.0000 mg | ORAL_TABLET | Freq: Two times a day (BID) | ORAL | Status: DC
Start: 2013-08-31 — End: 2013-10-12

## 2013-08-31 NOTE — Progress Notes (Signed)
CRITICAL VALUE ALERT Critical value received: wbc 67.4 Date of notification:  08/31/13 Time of notification: 09:50 Critical value read back:  yes Nurse who received alert:  ty MD notified (1st page):  Kirby Crigler

## 2013-08-31 NOTE — Addendum Note (Signed)
Addended by: Baird Cancer on: 08/31/2013 10:38 AM   Modules accepted: Orders, Medications

## 2013-08-31 NOTE — Progress Notes (Signed)
Labs drawn today for cbc/diff,cmp 

## 2013-09-11 NOTE — Progress Notes (Signed)
Randall Hampshire, MD [place] [address]  CLL (chronic lymphocytic leukemia)  CURRENT THERAPY: Started Idelalisib 150 mg daily on 07/31/13 x 7 days and then Idelalisib 150 mg BID beginning on 08/07/2013.  INTERVAL HISTORY: Randall Reyes 78 y.o. male returns for  regular  visit for followup of CLL (without 11q or 17 p deletion) presenting with stage 0 disease in October 2006 however by the end of June 2010 his white count risen to 180,000, beta-2 microglobulin level was up to 8.06, hemoglobin has been dropping gradually but steadily and he was significantly hypogammaglobulinemic. His LDH was elevated. His lymphocyte count reached 175,000 and he had developed diffuse lymphadenopathy. He become symptomatic. We treated him with 3 cycles of fludarabine, Cytoxan, and Rituxan. He then developed significant pancytopenia requiring discontinuation of therapy. He has not require therapy since. Imbruvica started for clinical findings demonstrating progressive CLL on 06/22/2013, however, medication placed on hold for suspected Imbruvica-induced violaceous rash of right posterior leg. Started Idelalisib on 07/31/2013.     CLL (chronic lymphocytic leukemia)   02/14/2005 Procedure Peripheral flow cytometry- CLL (without 11q or 17 p deletion) presenting with stage 0 disease   10/25/2008 Progression Increasing WBC and symptoms suggestive of progressive disease   11/09/2008 - 01/07/2009 Chemotherapy Rituxan, fludarabine, cytoxan x 3 cycles with the discontinuation of fludarabine and cytoxan due to severe pancytopenia requiring hospitalization.   01/10/2009 Adverse Reaction Severe pancytopenia and adverse side effects   02/01/2009 - 04/01/2009 Chemotherapy Rituxan every 21 days x 3 cycles   04/02/2009 Remission Stable disease.  Chemotherapy holiday started   06/22/2013 - 06/29/2013 Chemotherapy Imbruvica daily   06/29/2013 Adverse Reaction Large right posterior leg (popliteal) ecchymosis/violaceous  rash.  Likely Imbruvica-induced   07/31/2013 -  Chemotherapy Zydelig (idelalisib) monotherapy    I personally reviewed and went over laboratory results with the patient.  The results are noted within this dictation.  He planted a large garden this year.  Last year, he did not plant a garden because he was fatigued and lacked the energy to do so.  His wife reports, "he planted enough crop to feed the whole community."  She is very surprised high much energy Randall Reyes has.  This is great and evidence of the medication working clinically.   I provided education regarding his leukocytosis which is expected with Zydelig.  This should start coming back down in the near future.   Hematologically, he denies any complaints and ROS questioning is negative.   He asks if he can see a dentist, he is encouraged to do so.  If any surgical interventions are recommended from a dental standpoint, he will let us know.     Past Medical History  Diagnosis Date  . Diabetes mellitus   . Leukemia 6.22.2012    PER PATIENT  . Hypertension   . Ulcer   . Anemia   . CLL (chronic lymphocytic leukemia) 01/24/2011  . Melanoma in situ 01/24/2011  . DM (diabetes mellitus) 01/24/2011  . Mild obesity 01/24/2011  . Eczema 01/24/2011  . Port catheter in place 05/02/2012    has CLL (chronic lymphocytic leukemia); Melanoma in situ; DM (diabetes mellitus); Mild obesity; Eczema; and Port catheter in place on his problem list.     is allergic to imbruvica; aspirin; and ibuprofen.  Randall Reyes had no medications administered during this visit.  Past Surgical History  Procedure Laterality Date  . Hernia repair  2001  . Bleeding ulcer    . Portacath  placement  2008  . Cataract extraction w/phaco  05/05/2012    Procedure: CATARACT EXTRACTION PHACO AND INTRAOCULAR LENS PLACEMENT (IOC);  Surgeon: Tonny Branch, MD;  Location: [facility];  Service: Ophthalmology;  Laterality: Right;  CDE:13.25  . Cataract extraction w/phaco  05/15/2012     Procedure: CATARACT EXTRACTION PHACO AND INTRAOCULAR LENS PLACEMENT (IOC);  Surgeon: Tonny Branch, MD;  Location: [facility];  Service: Ophthalmology;  Laterality: Left;  CDE:  12.32    Denies any headaches, dizziness, double vision, fevers, chills, night sweats, nausea, vomiting, diarrhea, constipation, chest pain, heart palpitations, shortness of breath, blood in stool, black tarry stool, urinary pain, urinary burning, urinary frequency, hematuria.   PHYSICAL EXAMINATION  ECOG PERFORMANCE STATUS: 0 - Asymptomatic  Filed Vitals:   09/14/13 0932  BP: 156/82  Pulse: 61  Temp: 98.2 F (36.8 C)  Resp: 20    GENERAL:alert, no distress, well nourished, well developed, comfortable, cooperative, obese and smiling SKIN: skin color, texture, turgor are normal, no rashes or significant lesions HEAD: Normocephalic, No masses, lesions, tenderness or abnormalities EYES: normal, PERRLA, EOMI, Conjunctiva are pink and non-injected EARS: External ears normal OROPHARYNX:mucous membranes are moist  NECK: supple, no adenopathy, trachea midline LYMPH:  no palpable lymphadenopathy BREAST:not examined LUNGS: clear to auscultation  HEART: regular rate & rhythm ABDOMEN:abdomen soft, non-tender and normal bowel sounds BACK: Back symmetric, no curvature. EXTREMITIES:less then 2 second capillary refill, no joint deformities, effusion, or inflammation, no edema, no skin discoloration, no clubbing, no cyanosis  NEURO: alert & oriented x 3 with fluent speech, no focal motor/sensory deficits, gait normal   LABORATORY DATA: CBC    Component Value Date/Time   WBC 68.4* 09/14/2013 0910   RBC 3.31* 09/14/2013 0910   HGB 11.9* 09/14/2013 0910   HCT 36.0* 09/14/2013 0910   PLT 173 09/14/2013 0910   MCV 108.8* 09/14/2013 0910   MCH 36.0* 09/14/2013 0910   MCHC 33.1 09/14/2013 0910   RDW 15.1 09/14/2013 0910   LYMPHSABS 64.2* 09/14/2013 0910   MONOABS 1.4* 09/14/2013 0910   EOSABS 0.5 09/14/2013 0910   BASOSABS 0.2* 09/14/2013 0910       Chemistry      Component Value Date/Time   NA 138 09/14/2013 0910   K 4.4 09/14/2013 0910   CL 104 09/14/2013 0910   CO2 24 09/14/2013 0910   BUN 19 09/14/2013 0910   CREATININE 1.40* 09/14/2013 0910      Component Value Date/Time   CALCIUM 8.9 09/14/2013 0910   ALKPHOS 75 09/14/2013 0910   AST 26 09/14/2013 0910   ALT 32 09/14/2013 0910   BILITOT 0.2* 09/14/2013 0910         ASSESSMENT:  1. CLL (without 11q or 17 p deletion) presenting with stage 0 disease in October 2006 however by the end of June 2010 his white count risen to 180,000, beta-2 microglobulin level was up to 8.06, hemoglobin has been dropping gradually but steadily and he was significantly hypogammaglobulinemic. His LDH was elevated. His lymphocyte count reached 175,000 and he had developed diffuse lymphadenopathy. He become symptomatic. We treated him with 3 cycles of fludarabine, Cytoxan, and Rituxan. He then developed significant pancytopenia requiring discontinuation of therapy. He has not require therapy since. Imbruvica started for clinical findings demonstrating progressive CLL on 06/22/2013, however, medication placed on hold for suspected Imbruvica-induced violaceous rash of right posterior leg. Started Idelalisib on 07/31/2013.  2. Suspected Imbruvica-induced violaceous rash of posterior right leg.  Patient Active Problem List   Diagnosis Date  Noted  . Port catheter in place 05/02/2012  . CLL (chronic lymphocytic leukemia) 01/24/2011  . Melanoma in situ 01/24/2011  . DM (diabetes mellitus) 01/24/2011  . Mild obesity 01/24/2011  . Eczema 01/24/2011     PLAN:  1. I personally reviewed and went over laboratory results with the patient. The results are noted within this dictation.  2. Continue Idelalisib 150 mg BID (every 12 hours).  3. Labs every 2 weeks: CBC diff, CMET x 3 months (June 2015)  4. Recommend seeing a dentist.  Depending on dental work recommended, we will adjust chemotherapy according. 5. Return in 6-8  weeks for follow-up.   THERAPY PLAN:  He started Idelalisib 150 mg daily on 07/31/2013. On 08/07/2013, he increased the dose to 150 mg BID (maximum dose). We will monitor CBC diff, CMET every 2 weeks x 3 months (finishing in June 2015), then every 4 weeks x 3 months (finishing in August 2015) , and then every 1-3 months thereafter per the PI. He is educated about the warnings and precautions including hepatotoxicity, severe diarrhea and colitis, pneumonitis, intestinal perforation, severe cutaneous rash, anaphylaxis, and neutropenia.   All questions were answered. The patient knows to call the clinic with any problems, questions or concerns. We can certainly see the patient much sooner if necessary.  Patient and plan discussed with Dr. Farrel Gobble and he is in agreement with the aforementioned.   Baird Cancer 09/14/2013

## 2013-09-14 ENCOUNTER — Encounter (HOSPITAL_COMMUNITY): Payer: Medicare Other | Attending: Oncology

## 2013-09-14 ENCOUNTER — Encounter (HOSPITAL_BASED_OUTPATIENT_CLINIC_OR_DEPARTMENT_OTHER): Payer: Medicare Other | Admitting: Oncology

## 2013-09-14 VITALS — BP 156/82 | HR 61 | Temp 98.2°F | Resp 20 | Wt 145.7 lb

## 2013-09-14 DIAGNOSIS — C9110 Chronic lymphocytic leukemia of B-cell type not having achieved remission: Secondary | ICD-10-CM

## 2013-09-14 DIAGNOSIS — L988 Other specified disorders of the skin and subcutaneous tissue: Secondary | ICD-10-CM

## 2013-09-14 LAB — CBC WITH DIFFERENTIAL/PLATELET
BASOS ABS: 0.2 10*3/uL — AB (ref 0.0–0.1)
BASOS PCT: 0 % (ref 0–1)
EOS PCT: 1 % (ref 0–5)
Eosinophils Absolute: 0.5 10*3/uL (ref 0.0–0.7)
HEMATOCRIT: 36 % — AB (ref 39.0–52.0)
HEMOGLOBIN: 11.9 g/dL — AB (ref 13.0–17.0)
Lymphocytes Relative: 94 % — ABNORMAL HIGH (ref 12–46)
Lymphs Abs: 64.2 10*3/uL — ABNORMAL HIGH (ref 0.7–4.0)
MCH: 36 pg — ABNORMAL HIGH (ref 26.0–34.0)
MCHC: 33.1 g/dL (ref 30.0–36.0)
MCV: 108.8 fL — AB (ref 78.0–100.0)
MONOS PCT: 2 % — AB (ref 3–12)
Monocytes Absolute: 1.4 10*3/uL — ABNORMAL HIGH (ref 0.1–1.0)
NEUTROS ABS: 2.2 10*3/uL (ref 1.7–7.7)
Neutrophils Relative %: 3 % — ABNORMAL LOW (ref 43–77)
Platelets: 173 10*3/uL (ref 150–400)
RBC: 3.31 MIL/uL — ABNORMAL LOW (ref 4.22–5.81)
RDW: 15.1 % (ref 11.5–15.5)
WBC: 68.4 10*3/uL (ref 4.0–10.5)

## 2013-09-14 LAB — COMPREHENSIVE METABOLIC PANEL
ALBUMIN: 3.5 g/dL (ref 3.5–5.2)
ALT: 32 U/L (ref 0–53)
AST: 26 U/L (ref 0–37)
Alkaline Phosphatase: 75 U/L (ref 39–117)
BUN: 19 mg/dL (ref 6–23)
CHLORIDE: 104 meq/L (ref 96–112)
CO2: 24 mEq/L (ref 19–32)
CREATININE: 1.4 mg/dL — AB (ref 0.50–1.35)
Calcium: 8.9 mg/dL (ref 8.4–10.5)
GFR calc Af Amer: 55 mL/min — ABNORMAL LOW (ref >90)
GFR calc non Af Amer: 47 mL/min — ABNORMAL LOW (ref >90)
Glucose, Bld: 199 mg/dL — ABNORMAL HIGH (ref 70–99)
Potassium: 4.4 mEq/L (ref 3.7–5.3)
Sodium: 138 mEq/L (ref 137–147)
Total Bilirubin: 0.2 mg/dL — ABNORMAL LOW (ref 0.3–1.2)
Total Protein: 8 g/dL (ref 6.0–8.3)

## 2013-09-14 NOTE — Progress Notes (Signed)
Labs drawn today for cbc/diff,cmp 

## 2013-09-14 NOTE — Patient Instructions (Addendum)
[facility] Discharge Instructions  RECOMMENDATIONS MADE BY THE CONSULTANT AND ANY TEST RESULTS WILL BE SENT TO YOUR REFERRING PHYSICIAN.  We will see you for labs every 2 weeks. You will see the doctor in 8 weeks.  Please call for any questions or concerns.      Thank you for choosing [facility] to provide your oncology and hematology care.  To afford each patient quality time with our providers, please arrive at least 15 minutes before your scheduled appointment time.  With your help, our goal is to use those 15 minutes to complete the necessary work-up to ensure our physicians have the information they need to help with your evaluation and healthcare recommendations.    Effective January 1st, 2014, we ask that you re-schedule your appointment with our physicians should you arrive 10 or more minutes late for your appointment.  We strive to give you quality time with our providers, and arriving late affects you and other patients whose appointments are after yours.    Again, thank you for choosing [facility].  Our hope is that these requests will decrease the amount of time that you wait before being seen by our physicians.       _____________________________________________________________  Should you have questions after your visit to [facility], please contact our office at [phone] between the hours of 8:30 a.m. and 5:00 p.m.  Voicemails left after 4:30 p.m. will not be returned until the following business day.  For prescription refill requests, have your pharmacy contact our office with your prescription refill request.

## 2013-09-14 NOTE — Progress Notes (Signed)
CRITICAL VALUE ALERT  Critical value received:  WBC 68,400  Date of notification:  09/14/13  Time of notification:  1000  Critical value read back:yes  Nurse who received alert:  [place] notified and he said ok.

## 2013-09-28 ENCOUNTER — Telehealth (HOSPITAL_COMMUNITY): Payer: Self-pay

## 2013-09-28 ENCOUNTER — Encounter (HOSPITAL_BASED_OUTPATIENT_CLINIC_OR_DEPARTMENT_OTHER): Payer: Medicare Other

## 2013-09-28 DIAGNOSIS — C9110 Chronic lymphocytic leukemia of B-cell type not having achieved remission: Secondary | ICD-10-CM | POA: Diagnosis not present

## 2013-09-28 LAB — CBC WITH DIFFERENTIAL/PLATELET
Basophils Absolute: 0.1 10*3/uL (ref 0.0–0.1)
Basophils Relative: 0 % (ref 0–1)
EOS PCT: 2 % (ref 0–5)
Eosinophils Absolute: 1.1 10*3/uL — ABNORMAL HIGH (ref 0.0–0.7)
HCT: 36 % — ABNORMAL LOW (ref 39.0–52.0)
Hemoglobin: 11.7 g/dL — ABNORMAL LOW (ref 13.0–17.0)
LYMPHS ABS: 53.6 10*3/uL — AB (ref 0.7–4.0)
LYMPHS PCT: 92 % — AB (ref 12–46)
MCH: 35.1 pg — ABNORMAL HIGH (ref 26.0–34.0)
MCHC: 32.5 g/dL (ref 30.0–36.0)
MCV: 108.1 fL — AB (ref 78.0–100.0)
Monocytes Absolute: 1.3 10*3/uL — ABNORMAL HIGH (ref 0.1–1.0)
Monocytes Relative: 2 % — ABNORMAL LOW (ref 3–12)
NEUTROS ABS: 2.1 10*3/uL (ref 1.7–7.7)
NEUTROS PCT: 4 % — AB (ref 43–77)
Platelets: 172 10*3/uL (ref 150–400)
RBC: 3.33 MIL/uL — AB (ref 4.22–5.81)
RDW: 15.1 % (ref 11.5–15.5)
WBC: 58.2 10*3/uL — AB (ref 4.0–10.5)

## 2013-09-28 LAB — COMPREHENSIVE METABOLIC PANEL
ALBUMIN: 3.5 g/dL (ref 3.5–5.2)
ALT: 61 U/L — AB (ref 0–53)
AST: 43 U/L — AB (ref 0–37)
Alkaline Phosphatase: 74 U/L (ref 39–117)
BILIRUBIN TOTAL: 0.2 mg/dL — AB (ref 0.3–1.2)
BUN: 25 mg/dL — ABNORMAL HIGH (ref 6–23)
CO2: 24 meq/L (ref 19–32)
CREATININE: 1.39 mg/dL — AB (ref 0.50–1.35)
Calcium: 8.8 mg/dL (ref 8.4–10.5)
Chloride: 105 mEq/L (ref 96–112)
GFR calc Af Amer: 55 mL/min — ABNORMAL LOW (ref >90)
GFR calc non Af Amer: 48 mL/min — ABNORMAL LOW (ref >90)
Glucose, Bld: 136 mg/dL — ABNORMAL HIGH (ref 70–99)
POTASSIUM: 4.8 meq/L (ref 3.7–5.3)
Sodium: 138 mEq/L (ref 137–147)
Total Protein: 8 g/dL (ref 6.0–8.3)

## 2013-09-28 NOTE — Telephone Encounter (Signed)
CRITICAL VALUE ALERT Critical value received:  WBC 58.2 Date of notification:  09/28/13  Time of notification: 0945 Critical value read back:  yes Nurse who received alert:  Mickie Kay, RN Robynn Pane, PA-C notified.

## 2013-10-01 NOTE — Progress Notes (Signed)
Labs drawn

## 2013-10-08 ENCOUNTER — Other Ambulatory Visit (HOSPITAL_COMMUNITY): Payer: Self-pay

## 2013-10-12 ENCOUNTER — Encounter (HOSPITAL_COMMUNITY): Payer: Medicare Other | Attending: Oncology

## 2013-10-12 ENCOUNTER — Telehealth (HOSPITAL_COMMUNITY): Payer: Self-pay

## 2013-10-12 ENCOUNTER — Other Ambulatory Visit (HOSPITAL_COMMUNITY): Payer: Self-pay | Admitting: Oncology

## 2013-10-12 ENCOUNTER — Other Ambulatory Visit (HOSPITAL_COMMUNITY): Payer: Medicare Other

## 2013-10-12 DIAGNOSIS — C9112 Chronic lymphocytic leukemia of B-cell type in relapse: Secondary | ICD-10-CM

## 2013-10-12 DIAGNOSIS — C9110 Chronic lymphocytic leukemia of B-cell type not having achieved remission: Secondary | ICD-10-CM

## 2013-10-12 LAB — COMPREHENSIVE METABOLIC PANEL
ALK PHOS: 115 U/L (ref 39–117)
ALT: 177 U/L — ABNORMAL HIGH (ref 0–53)
AST: 105 U/L — ABNORMAL HIGH (ref 0–37)
Albumin: 3.3 g/dL — ABNORMAL LOW (ref 3.5–5.2)
BUN: 32 mg/dL — AB (ref 6–23)
CO2: 21 meq/L (ref 19–32)
Calcium: 9.4 mg/dL (ref 8.4–10.5)
Chloride: 104 mEq/L (ref 96–112)
Creatinine, Ser: 1.9 mg/dL — ABNORMAL HIGH (ref 0.50–1.35)
GFR calc Af Amer: 38 mL/min — ABNORMAL LOW (ref >90)
GFR calc non Af Amer: 33 mL/min — ABNORMAL LOW (ref >90)
GLUCOSE: 150 mg/dL — AB (ref 70–99)
POTASSIUM: 5 meq/L (ref 3.7–5.3)
Sodium: 139 mEq/L (ref 137–147)
Total Bilirubin: 0.3 mg/dL (ref 0.3–1.2)
Total Protein: 8.5 g/dL — ABNORMAL HIGH (ref 6.0–8.3)

## 2013-10-12 LAB — CBC WITH DIFFERENTIAL/PLATELET
Basophils Absolute: 0.1 10*3/uL (ref 0.0–0.1)
Basophils Relative: 0 % (ref 0–1)
EOS PCT: 3 % (ref 0–5)
Eosinophils Absolute: 1.4 10*3/uL — ABNORMAL HIGH (ref 0.0–0.7)
HEMATOCRIT: 36.5 % — AB (ref 39.0–52.0)
HEMOGLOBIN: 12.1 g/dL — AB (ref 13.0–17.0)
LYMPHS ABS: 47.7 10*3/uL — AB (ref 0.7–4.0)
LYMPHS PCT: 89 % — AB (ref 12–46)
MCH: 35.5 pg — ABNORMAL HIGH (ref 26.0–34.0)
MCHC: 33.2 g/dL (ref 30.0–36.0)
MCV: 107 fL — AB (ref 78.0–100.0)
MONO ABS: 1.7 10*3/uL — AB (ref 0.1–1.0)
MONOS PCT: 3 % (ref 3–12)
Neutro Abs: 3.1 10*3/uL (ref 1.7–7.7)
Neutrophils Relative %: 6 % — ABNORMAL LOW (ref 43–77)
Platelets: 211 10*3/uL (ref 150–400)
RBC: 3.41 MIL/uL — AB (ref 4.22–5.81)
RDW: 14.7 % (ref 11.5–15.5)
WBC: 53.9 10*3/uL — AB (ref 4.0–10.5)

## 2013-10-12 MED ORDER — IDELALISIB 150 MG PO TABS
150.0000 mg | ORAL_TABLET | Freq: Every day | ORAL | Status: DC
Start: 2013-10-12 — End: 2013-11-09

## 2013-10-12 NOTE — Telephone Encounter (Signed)
CRITICAL VALUE ALERT Critical value received:  WBC 53.9 Date of notification:  10/12/13 Time of notification: 0915  Critical value read back:  yes Nurse who received alert:  Mickie Kay, RN Robynn Pane, PA-C notified.

## 2013-10-12 NOTE — Progress Notes (Signed)
Labs drawn

## 2013-10-15 ENCOUNTER — Ambulatory Visit (HOSPITAL_COMMUNITY): Payer: Medicare Other | Admitting: Oncology

## 2013-10-26 ENCOUNTER — Encounter (HOSPITAL_BASED_OUTPATIENT_CLINIC_OR_DEPARTMENT_OTHER): Payer: Medicare Other

## 2013-10-26 DIAGNOSIS — C9110 Chronic lymphocytic leukemia of B-cell type not having achieved remission: Secondary | ICD-10-CM

## 2013-10-26 LAB — CBC WITH DIFFERENTIAL/PLATELET
BASOS ABS: 0.1 10*3/uL (ref 0.0–0.1)
Basophils Relative: 0 % (ref 0–1)
EOS PCT: 2 % (ref 0–5)
Eosinophils Absolute: 1 10*3/uL — ABNORMAL HIGH (ref 0.0–0.7)
HCT: 35.8 % — ABNORMAL LOW (ref 39.0–52.0)
Hemoglobin: 11.5 g/dL — ABNORMAL LOW (ref 13.0–17.0)
LYMPHS PCT: 92 % — AB (ref 12–46)
Lymphs Abs: 59.1 10*3/uL — ABNORMAL HIGH (ref 0.7–4.0)
MCH: 34.5 pg — ABNORMAL HIGH (ref 26.0–34.0)
MCHC: 32.1 g/dL (ref 30.0–36.0)
MCV: 107.5 fL — ABNORMAL HIGH (ref 78.0–100.0)
Monocytes Absolute: 2.3 10*3/uL — ABNORMAL HIGH (ref 0.1–1.0)
Monocytes Relative: 4 % (ref 3–12)
NEUTROS ABS: 1.7 10*3/uL (ref 1.7–7.7)
Neutrophils Relative %: 3 % — ABNORMAL LOW (ref 43–77)
PLATELETS: 185 10*3/uL (ref 150–400)
RBC: 3.33 MIL/uL — ABNORMAL LOW (ref 4.22–5.81)
RDW: 14.8 % (ref 11.5–15.5)
WBC: 64.2 10*3/uL — AB (ref 4.0–10.5)

## 2013-10-26 LAB — COMPREHENSIVE METABOLIC PANEL
ALBUMIN: 3.2 g/dL — AB (ref 3.5–5.2)
ALT: 135 U/L — ABNORMAL HIGH (ref 0–53)
AST: 101 U/L — ABNORMAL HIGH (ref 0–37)
Alkaline Phosphatase: 89 U/L (ref 39–117)
BUN: 17 mg/dL (ref 6–23)
CHLORIDE: 103 meq/L (ref 96–112)
CO2: 26 mEq/L (ref 19–32)
CREATININE: 1.31 mg/dL (ref 0.50–1.35)
Calcium: 8.8 mg/dL (ref 8.4–10.5)
GFR calc Af Amer: 59 mL/min — ABNORMAL LOW (ref >90)
GFR calc non Af Amer: 51 mL/min — ABNORMAL LOW (ref >90)
Glucose, Bld: 121 mg/dL — ABNORMAL HIGH (ref 70–99)
POTASSIUM: 4.7 meq/L (ref 3.7–5.3)
Sodium: 139 mEq/L (ref 137–147)
TOTAL PROTEIN: 7.4 g/dL (ref 6.0–8.3)
Total Bilirubin: 0.3 mg/dL (ref 0.3–1.2)

## 2013-10-26 NOTE — Progress Notes (Signed)
CRITICAL VALUE ALERT Critical value received:  WBC 64.2 Date of notification:  10/26/2013  Time of notification: 0945 Critical value read back:  yes Nurse who received alert: Mahala Rommel, Hermenia Fiscal, RN MD notified (1st page):  Dr. Barnet Glasgow - no different follow up needed at this point per MD.

## 2013-10-26 NOTE — Progress Notes (Signed)
Labs drawn for cbc/diff, cmp.

## 2013-10-27 ENCOUNTER — Telehealth (HOSPITAL_COMMUNITY): Payer: Self-pay

## 2013-10-27 NOTE — Telephone Encounter (Signed)
Message left for patient that labs are stable.  To call clinic if any questions.

## 2013-10-27 NOTE — Telephone Encounter (Signed)
Message copied by Mellissa Kohut on Tue Oct 27, 2013  9:25 AM ------      Message from: Randall Reyes      Created: Tue Oct 27, 2013  9:00 AM       Pt would like to know his lab results from 10/26/13 ------

## 2013-11-07 NOTE — Progress Notes (Addendum)
Randall Hampshire, MD [place] [address]  CLL (chronic lymphocytic leukemia) - Plan: CBC with Differential, Comprehensive metabolic panel, CBC with Differential, Comprehensive metabolic panel  CURRENT THERAPY: Idelalisib 150 mg daily due to transaminitis on 10/12/2013.  Started Idelalisib 150 mg daily on 07/31/13 x 7 days and then Idelalisib 150 mg BID beginning on 08/07/2013.     INTERVAL HISTORY: Randall Reyes 78 y.o. male returns for  regular  visit for followup of CLL (without 11q or 17 p deletion) presenting with stage 0 disease in October 2006 however by the end of June 2010 his white count risen to 180,000, beta-2 microglobulin level was up to 8.06, hemoglobin has been dropping gradually but steadily and he was significantly hypogammaglobulinemic. His LDH was elevated. His lymphocyte count reached 175,000 and he had developed diffuse lymphadenopathy. He become symptomatic. We treated him with 3 cycles of fludarabine, Cytoxan, and Rituxan. He then developed significant pancytopenia requiring discontinuation of therapy. He has not require therapy since. Imbruvica started for clinical findings demonstrating progressive CLL on 06/22/2013, however, medication placed on hold for suspected Imbruvica-induced violaceous rash of right posterior leg. Started Idelalisib on 07/31/2013.     CLL (chronic lymphocytic leukemia)   02/14/2005 Procedure Peripheral flow cytometry- CLL (without 11q or 17 p deletion) presenting with stage 0 disease   10/25/2008 Progression Increasing WBC and symptoms suggestive of progressive disease   11/09/2008 - 01/07/2009 Chemotherapy Rituxan, fludarabine, cytoxan x 3 cycles with the discontinuation of fludarabine and cytoxan due to severe pancytopenia requiring hospitalization.   01/10/2009 Adverse Reaction Severe pancytopenia and adverse side effects   02/01/2009 - 04/01/2009 Chemotherapy Rituxan every 21 days x 3 cycles   04/02/2009 Remission Stable  disease.  Chemotherapy holiday started   06/22/2013 - 06/29/2013 Chemotherapy Imbruvica daily   06/29/2013 Adverse Reaction Large right posterior leg (popliteal) ecchymosis/violaceous rash.  Likely Imbruvica-induced   07/31/2013 - 10/12/2013 Chemotherapy Zydelig (idelalisib) 150 mg BID monotherapy   10/12/2013 Adverse Reaction Transaminitis, suspected to be secondary to [place].  Dose decreased.   10/12/2013 -  Chemotherapy Zydelig (idelalisib) 150 mg daily.   I personally reviewed and went over laboratory results with the patient.  The results are noted within this dictation.  He continues to tolerate Zydelig well without any complaints.    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  Hematologically, he denies any complaints and ROS questioning is negative.  Past Medical History  Diagnosis Date  . Diabetes mellitus   . Leukemia 6.22.2012    PER PATIENT  . Hypertension   . Ulcer   . Anemia   . CLL (chronic lymphocytic leukemia) 01/24/2011  . Melanoma in situ 01/24/2011  . DM (diabetes mellitus) 01/24/2011  . Mild obesity 01/24/2011  . Eczema 01/24/2011  . Port catheter in place 05/02/2012    has CLL (chronic lymphocytic leukemia); Melanoma in situ; DM (diabetes mellitus); Mild obesity; Eczema; and Port catheter in place on his problem list.     is allergic to imbruvica; aspirin; and ibuprofen.  Randall Reyes does not currently have medications on file.  Past Surgical History  Procedure Laterality Date  . Hernia repair  2001  . Bleeding ulcer    . Portacath placement  2008  . Cataract extraction w/phaco  05/05/2012    Procedure: CATARACT EXTRACTION PHACO AND INTRAOCULAR LENS PLACEMENT (IOC);  Surgeon: Tonny Branch, MD;  Location: [facility];  Service: Ophthalmology;  Laterality: Right;  CDE:13.25  .  Cataract extraction w/phaco  05/15/2012    Procedure: CATARACT EXTRACTION PHACO AND INTRAOCULAR LENS PLACEMENT (IOC);  Surgeon: Tonny Branch, MD;  Location: [facility];  Service:  Ophthalmology;  Laterality: Left;  CDE:  12.32    Denies any headaches, dizziness, double vision, fevers, chills, night sweats, nausea, vomiting, diarrhea, constipation, chest pain, heart palpitations, shortness of breath, blood in stool, black tarry stool, urinary pain, urinary burning, urinary frequency, hematuria.   PHYSICAL EXAMINATION  ECOG PERFORMANCE STATUS: 0 - Asymptomatic  Filed Vitals:   11/09/13 0800  BP: 148/88  Pulse: 62  Temp: 97.7 F (36.5 C)  Resp: 18    GENERAL:alert, no distress, well nourished, well developed, comfortable, cooperative and smiling SKIN: skin color, texture, turgor are normal, no rashes or significant lesions HEAD: Normocephalic, No masses, lesions, tenderness or abnormalities EYES: normal, PERRLA, EOMI, Conjunctiva are pink and non-injected EARS: External ears normal OROPHARYNX:mucous membranes are moist  NECK: supple, no adenopathy, trachea midline LYMPH:  no palpable lymphadenopathy BREAST:not examined LUNGS: clear to auscultation  HEART: regular rate & rhythm, no murmurs and no gallops ABDOMEN:abdomen soft and normal bowel sounds BACK: Back symmetric, no curvature. EXTREMITIES:less then 2 second capillary refill, no joint deformities, effusion, or inflammation, no clubbing, no cyanosis  NEURO: alert & oriented x 3 with fluent speech, no focal motor/sensory deficits, gait normal   LABORATORY DATA: CBC    Component Value Date/Time   WBC 64.2* 10/26/2013 0852   RBC 3.33* 10/26/2013 0852   HGB 11.5* 10/26/2013 0852   HCT 35.8* 10/26/2013 0852   PLT 185 10/26/2013 0852   MCV 107.5* 10/26/2013 0852   MCH 34.5* 10/26/2013 0852   MCHC 32.1 10/26/2013 0852   RDW 14.8 10/26/2013 0852   LYMPHSABS 59.1* 10/26/2013 0852   MONOABS 2.3* 10/26/2013 0852   EOSABS 1.0* 10/26/2013 0852   BASOSABS 0.1 10/26/2013 0852      Chemistry      Component Value Date/Time   NA 139 10/26/2013 0852   K 4.7 10/26/2013 0852   CL 103 10/26/2013 0852   CO2 26 10/26/2013  0852   BUN 17 10/26/2013 0852   CREATININE 1.31 10/26/2013 0852      Component Value Date/Time   CALCIUM 8.8 10/26/2013 0852   ALKPHOS 89 10/26/2013 0852   AST 101* 10/26/2013 0852   ALT 135* 10/26/2013 0852   BILITOT 0.3 10/26/2013 0852         ASSESSMENT:  1. CLL (without 11q or 17 p deletion) presenting with stage 0 disease in October 2006 however by the end of June 2010 his white count risen to 180,000, beta-2 microglobulin level was up to 8.06, hemoglobin has been dropping gradually but steadily and he was significantly hypogammaglobulinemic. His LDH was elevated. His lymphocyte count reached 175,000 and he had developed diffuse lymphadenopathy. He become symptomatic. We treated him with 3 cycles of fludarabine, Cytoxan, and Rituxan. He then developed significant pancytopenia requiring discontinuation of therapy. He has not require therapy since. Imbruvica started for clinical findings demonstrating progressive CLL on 06/22/2013, however, medication placed on hold for suspected Imbruvica-induced violaceous rash of right posterior leg. Started Idelalisib on 07/31/2013.  2. Suspected Imbruvica-induced violaceous rash of posterior right leg. 3. Transaminitis, secondary to Zydelig, dose reduced on 10/12/2013.  Patient Active Problem List   Diagnosis Date Noted  . Port catheter in place 05/02/2012  . CLL (chronic lymphocytic leukemia) 01/24/2011  . Melanoma in situ 01/24/2011  . DM (diabetes mellitus) 01/24/2011  . Mild obesity 01/24/2011  .  Eczema 01/24/2011     PLAN:  1. I personally reviewed and went over laboratory results with the patient.  The results are noted within this dictation. 2. Continue Idelalisib 150 mg daily 3. Continue labs every 2 weeks for the time being due to transaminitis: CBC diff, CMET  4. Return in 8 weeks for follow-up.  If labs are stable, will transition to labs every 4 weeks.   THERAPY PLAN:  He started Idelalisib 150 mg daily on 07/31/2013. On 08/07/2013, he  increased the dose to 150 mg BID (maximum dose). We will monitor CBC diff, CMET every 2 weeks x 3 months (finishing in June 2015), then every 4 weeks x 3 months (finishing in August 2015) , and then every 1-3 months thereafter per the PI. He is educated about the warnings and precautions including hepatotoxicity, severe diarrhea and colitis, pneumonitis, intestinal perforation, severe cutaneous rash, anaphylaxis, and neutropenia.    All questions were answered. The patient knows to call the clinic with any problems, questions or concerns. We can certainly see the patient much sooner if necessary.  Patient and plan discussed with Dr. Farrel Gobble and he is in agreement with the aforementioned.   KEFALAS,THOMAS 11/09/2013     ADDENDUM:  Labs returned.    Chemistry      Component Value Date/Time   NA 141 11/09/2013 0834   K 4.6 11/09/2013 0834   CL 103 11/09/2013 0834   CO2 27 11/09/2013 0834   BUN 21 11/09/2013 0834   CREATININE 1.27 11/09/2013 0834      Component Value Date/Time   CALCIUM 9.3 11/09/2013 0834   ALKPHOS 100 11/09/2013 0834   AST 139* 11/09/2013 0834   ALT 207* 11/09/2013 0834   BILITOT 0.4 11/09/2013 0834     Transaminitis worsened, therefore, he was told to hold Zydelig x 2 weeks.  We will repeat labs and make treatment decisions accordingly.  KEFALAS,THOMAS 11/09/2013

## 2013-11-09 ENCOUNTER — Telehealth (HOSPITAL_COMMUNITY): Payer: Self-pay | Admitting: *Deleted

## 2013-11-09 ENCOUNTER — Encounter (HOSPITAL_COMMUNITY): Payer: Self-pay | Admitting: Oncology

## 2013-11-09 ENCOUNTER — Encounter (HOSPITAL_BASED_OUTPATIENT_CLINIC_OR_DEPARTMENT_OTHER): Payer: Medicare Other | Admitting: Oncology

## 2013-11-09 ENCOUNTER — Encounter (HOSPITAL_BASED_OUTPATIENT_CLINIC_OR_DEPARTMENT_OTHER): Payer: Medicare Other

## 2013-11-09 ENCOUNTER — Other Ambulatory Visit (HOSPITAL_COMMUNITY): Payer: Self-pay | Admitting: Oncology

## 2013-11-09 VITALS — BP 148/88 | HR 62 | Temp 97.7°F | Resp 18 | Wt 143.7 lb

## 2013-11-09 DIAGNOSIS — C9110 Chronic lymphocytic leukemia of B-cell type not having achieved remission: Secondary | ICD-10-CM

## 2013-11-09 DIAGNOSIS — L988 Other specified disorders of the skin and subcutaneous tissue: Secondary | ICD-10-CM

## 2013-11-09 LAB — CBC WITH DIFFERENTIAL/PLATELET
Basophils Absolute: 0.2 10*3/uL — ABNORMAL HIGH (ref 0.0–0.1)
Basophils Relative: 0 % (ref 0–1)
EOS ABS: 1.9 10*3/uL — AB (ref 0.0–0.7)
EOS PCT: 3 % (ref 0–5)
HEMATOCRIT: 36.6 % — AB (ref 39.0–52.0)
HEMOGLOBIN: 12.1 g/dL — AB (ref 13.0–17.0)
LYMPHS ABS: 58.3 10*3/uL — AB (ref 0.7–4.0)
LYMPHS PCT: 90 % — AB (ref 12–46)
MCH: 35.7 pg — ABNORMAL HIGH (ref 26.0–34.0)
MCHC: 33.1 g/dL (ref 30.0–36.0)
MCV: 108 fL — AB (ref 78.0–100.0)
MONO ABS: 1.8 10*3/uL — AB (ref 0.1–1.0)
MONOS PCT: 3 % (ref 3–12)
Neutro Abs: 2.9 10*3/uL (ref 1.7–7.7)
Neutrophils Relative %: 4 % — ABNORMAL LOW (ref 43–77)
Platelets: 150 10*3/uL (ref 150–400)
RBC: 3.39 MIL/uL — AB (ref 4.22–5.81)
RDW: 15.3 % (ref 11.5–15.5)
WBC: 64.9 10*3/uL — AB (ref 4.0–10.5)

## 2013-11-09 LAB — COMPREHENSIVE METABOLIC PANEL
ALT: 207 U/L — ABNORMAL HIGH (ref 0–53)
AST: 139 U/L — ABNORMAL HIGH (ref 0–37)
Albumin: 3.7 g/dL (ref 3.5–5.2)
Alkaline Phosphatase: 100 U/L (ref 39–117)
BUN: 21 mg/dL (ref 6–23)
CALCIUM: 9.3 mg/dL (ref 8.4–10.5)
CHLORIDE: 103 meq/L (ref 96–112)
CO2: 27 meq/L (ref 19–32)
CREATININE: 1.27 mg/dL (ref 0.50–1.35)
GFR calc non Af Amer: 53 mL/min — ABNORMAL LOW (ref >90)
GFR, EST AFRICAN AMERICAN: 62 mL/min — AB (ref >90)
GLUCOSE: 129 mg/dL — AB (ref 70–99)
Potassium: 4.6 mEq/L (ref 3.7–5.3)
Sodium: 141 mEq/L (ref 137–147)
Total Bilirubin: 0.4 mg/dL (ref 0.3–1.2)
Total Protein: 8 g/dL (ref 6.0–8.3)

## 2013-11-09 MED ORDER — IDELALISIB 150 MG PO TABS: 150.0000 mg | ORAL_TABLET | Freq: Every day | ORAL | Status: DC

## 2013-11-09 NOTE — Telephone Encounter (Signed)
..  CRITICAL VALUE ALERT Critical value received:  Wbc 64.9 Date of notification:  11/09/2013 Time of notification: 1000 Critical value read back:  Yes.   Nurse who received alert:  tar MD notified (1st page):  T.Kefalas PA

## 2013-11-09 NOTE — Patient Instructions (Signed)
[facility] Discharge Instructions  RECOMMENDATIONS MADE BY THE CONSULTANT AND ANY TEST RESULTS WILL BE SENT TO YOUR REFERRING PHYSICIAN.  Continue your medications as ordered. You will have labs every 2 weeks and see the doctor in 8 weeks.  Please call for any questions or concerns.  Thank you for choosing [facility] to provide your oncology and hematology care.  To afford each patient quality time with our providers, please arrive at least 15 minutes before your scheduled appointment time.  With your help, our goal is to use those 15 minutes to complete the necessary work-up to ensure our physicians have the information they need to help with your evaluation and healthcare recommendations.    Effective January 1st, 2014, we ask that you re-schedule your appointment with our physicians should you arrive 10 or more minutes late for your appointment.  We strive to give you quality time with our providers, and arriving late affects you and other patients whose appointments are after yours.    Again, thank you for choosing [facility].  Our hope is that these requests will decrease the amount of time that you wait before being seen by our physicians.       _____________________________________________________________  Should you have questions after your visit to [facility], please contact our office at [phone] between the hours of 8:30 a.m. and 4:30 p.m.  Voicemails left after 4:30 p.m. will not be returned until the following business day.  For prescription refill requests, have your pharmacy contact our office with your prescription refill request.    _______________________________________________________________  We hope that we have given you very good care.  You may receive a patient satisfaction survey in the mail, please complete it and return it as soon as possible.  We value your feedback!

## 2013-11-09 NOTE — Addendum Note (Signed)
Addended by: Baird Cancer on: 11/09/2013 01:25 PM   Modules accepted: Level of Service

## 2013-11-09 NOTE — Progress Notes (Signed)
LABS DRAWN FOR CMP,CBCD. 

## 2013-11-23 ENCOUNTER — Encounter (HOSPITAL_COMMUNITY): Payer: Medicare Other | Attending: Oncology

## 2013-11-23 DIAGNOSIS — C9110 Chronic lymphocytic leukemia of B-cell type not having achieved remission: Secondary | ICD-10-CM | POA: Diagnosis present

## 2013-11-23 LAB — COMPREHENSIVE METABOLIC PANEL
ALBUMIN: 3.5 g/dL (ref 3.5–5.2)
ALT: 200 U/L — ABNORMAL HIGH (ref 0–53)
ANION GAP: 12 (ref 5–15)
AST: 106 U/L — ABNORMAL HIGH (ref 0–37)
Alkaline Phosphatase: 116 U/L (ref 39–117)
BUN: 20 mg/dL (ref 6–23)
CALCIUM: 10.2 mg/dL (ref 8.4–10.5)
CO2: 25 mEq/L (ref 19–32)
Chloride: 103 mEq/L (ref 96–112)
Creatinine, Ser: 1.4 mg/dL — ABNORMAL HIGH (ref 0.50–1.35)
GFR calc non Af Amer: 47 mL/min — ABNORMAL LOW (ref >90)
GFR, EST AFRICAN AMERICAN: 55 mL/min — AB (ref >90)
Glucose, Bld: 131 mg/dL — ABNORMAL HIGH (ref 70–99)
Potassium: 4.4 mEq/L (ref 3.7–5.3)
Sodium: 140 mEq/L (ref 137–147)
TOTAL PROTEIN: 7.5 g/dL (ref 6.0–8.3)
Total Bilirubin: 0.6 mg/dL (ref 0.3–1.2)

## 2013-11-23 LAB — CBC WITH DIFFERENTIAL/PLATELET
BASOS ABS: 0 10*3/uL (ref 0.0–0.1)
BASOS PCT: 0 % (ref 0–1)
Band Neutrophils: 0 % (ref 0–10)
Blasts: 0 %
EOS ABS: 1.5 10*3/uL — AB (ref 0.0–0.7)
EOS PCT: 3 % (ref 0–5)
HCT: 35.6 % — ABNORMAL LOW (ref 39.0–52.0)
Hemoglobin: 12.2 g/dL — ABNORMAL LOW (ref 13.0–17.0)
Lymphocytes Relative: 82 % — ABNORMAL HIGH (ref 12–46)
Lymphs Abs: 40.6 10*3/uL — ABNORMAL HIGH (ref 0.7–4.0)
MCH: 36 pg — AB (ref 26.0–34.0)
MCHC: 34.3 g/dL (ref 30.0–36.0)
MCV: 105 fL — ABNORMAL HIGH (ref 78.0–100.0)
METAMYELOCYTES PCT: 0 %
MONO ABS: 2.5 10*3/uL — AB (ref 0.1–1.0)
MONOS PCT: 5 % (ref 3–12)
Myelocytes: 0 %
NRBC: 0 /100{WBCs}
Neutro Abs: 5 10*3/uL (ref 1.7–7.7)
Neutrophils Relative %: 10 % — ABNORMAL LOW (ref 43–77)
PLATELETS: 125 10*3/uL — AB (ref 150–400)
Promyelocytes Absolute: 0 %
RBC: 3.39 MIL/uL — AB (ref 4.22–5.81)
RDW: 15 % (ref 11.5–15.5)
WBC: 49.6 10*3/uL — AB (ref 4.0–10.5)

## 2013-11-23 NOTE — Progress Notes (Signed)
LABS DRAWN FOR CBCD,CMP 

## 2013-12-07 ENCOUNTER — Encounter (HOSPITAL_BASED_OUTPATIENT_CLINIC_OR_DEPARTMENT_OTHER): Payer: Medicare Other

## 2013-12-07 ENCOUNTER — Telehealth (HOSPITAL_COMMUNITY): Payer: Self-pay

## 2013-12-07 DIAGNOSIS — C9110 Chronic lymphocytic leukemia of B-cell type not having achieved remission: Secondary | ICD-10-CM

## 2013-12-07 LAB — COMPREHENSIVE METABOLIC PANEL WITH GFR
ALT: 29 U/L (ref 0–53)
AST: 24 U/L (ref 0–37)
Albumin: 3.4 g/dL — ABNORMAL LOW (ref 3.5–5.2)
Alkaline Phosphatase: 87 U/L (ref 39–117)
Anion gap: 12 (ref 5–15)
BUN: 18 mg/dL (ref 6–23)
CO2: 22 meq/L (ref 19–32)
Calcium: 9.5 mg/dL (ref 8.4–10.5)
Chloride: 107 meq/L (ref 96–112)
Creatinine, Ser: 1.33 mg/dL (ref 0.50–1.35)
GFR calc Af Amer: 58 mL/min — ABNORMAL LOW
GFR calc non Af Amer: 50 mL/min — ABNORMAL LOW
Glucose, Bld: 139 mg/dL — ABNORMAL HIGH (ref 70–99)
Potassium: 4.2 meq/L (ref 3.7–5.3)
Sodium: 141 meq/L (ref 137–147)
Total Bilirubin: 0.4 mg/dL (ref 0.3–1.2)
Total Protein: 7.6 g/dL (ref 6.0–8.3)

## 2013-12-07 LAB — CBC WITH DIFFERENTIAL/PLATELET
BASOS ABS: 0 10*3/uL (ref 0.0–0.1)
BASOS PCT: 0 % (ref 0–1)
EOS PCT: 8 % — AB (ref 0–5)
Eosinophils Absolute: 4.3 10*3/uL — ABNORMAL HIGH (ref 0.0–0.7)
HCT: 35.3 % — ABNORMAL LOW (ref 39.0–52.0)
HEMOGLOBIN: 11.6 g/dL — AB (ref 13.0–17.0)
Lymphocytes Relative: 75 % — ABNORMAL HIGH (ref 12–46)
Lymphs Abs: 40.6 10*3/uL — ABNORMAL HIGH (ref 0.7–4.0)
MCH: 35.3 pg — ABNORMAL HIGH (ref 26.0–34.0)
MCHC: 32.9 g/dL (ref 30.0–36.0)
MCV: 107.3 fL — ABNORMAL HIGH (ref 78.0–100.0)
MONO ABS: 1.6 10*3/uL — AB (ref 0.1–1.0)
MONOS PCT: 3 % (ref 3–12)
Neutro Abs: 7.6 10*3/uL (ref 1.7–7.7)
Neutrophils Relative %: 9 % — ABNORMAL LOW (ref 43–77)
Platelets: 146 10*3/uL — ABNORMAL LOW (ref 150–400)
Promyelocytes Absolute: 5 %
RBC: 3.29 MIL/uL — AB (ref 4.22–5.81)
RDW: 16 % — ABNORMAL HIGH (ref 11.5–15.5)
WBC: 54.1 10*3/uL — AB (ref 4.0–10.5)

## 2013-12-07 NOTE — Telephone Encounter (Signed)
CRITICAL VALUE ALERT Critical value received:  WBC  Date of notification:  12/07/13  Time of notification: 0940 Critical value read back:  Yes.   Nurse who received alert:  Mickie Kay, RN Reported to Robynn Pane, PA-C

## 2013-12-07 NOTE — Progress Notes (Signed)
Labs drawn for cbcd,cmp 

## 2013-12-21 ENCOUNTER — Encounter (HOSPITAL_BASED_OUTPATIENT_CLINIC_OR_DEPARTMENT_OTHER): Payer: Medicare Other

## 2013-12-21 ENCOUNTER — Encounter (HOSPITAL_COMMUNITY): Payer: Medicare Other | Attending: Oncology

## 2013-12-21 ENCOUNTER — Telehealth (HOSPITAL_COMMUNITY): Payer: Self-pay | Admitting: Hematology and Oncology

## 2013-12-21 DIAGNOSIS — B029 Zoster without complications: Secondary | ICD-10-CM | POA: Insufficient documentation

## 2013-12-21 DIAGNOSIS — B0229 Other postherpetic nervous system involvement: Secondary | ICD-10-CM | POA: Insufficient documentation

## 2013-12-21 DIAGNOSIS — C9110 Chronic lymphocytic leukemia of B-cell type not having achieved remission: Secondary | ICD-10-CM | POA: Diagnosis not present

## 2013-12-21 DIAGNOSIS — B356 Tinea cruris: Secondary | ICD-10-CM | POA: Diagnosis present

## 2013-12-21 LAB — CBC WITH DIFFERENTIAL/PLATELET
BASOS ABS: 0 10*3/uL (ref 0.0–0.1)
BASOS PCT: 0 % (ref 0–1)
Band Neutrophils: 0 % (ref 0–10)
Blasts: 0 %
EOS PCT: 0 % (ref 0–5)
Eosinophils Absolute: 0 10*3/uL (ref 0.0–0.7)
HCT: 35.5 % — ABNORMAL LOW (ref 39.0–52.0)
HEMOGLOBIN: 12 g/dL — AB (ref 13.0–17.0)
Lymphocytes Relative: 91 % — ABNORMAL HIGH (ref 12–46)
Lymphs Abs: 46.9 10*3/uL — ABNORMAL HIGH (ref 0.7–4.0)
MCH: 36.6 pg — AB (ref 26.0–34.0)
MCHC: 33.8 g/dL (ref 30.0–36.0)
MCV: 108.2 fL — AB (ref 78.0–100.0)
METAMYELOCYTES PCT: 0 %
MYELOCYTES: 0 %
Monocytes Absolute: 0 10*3/uL — ABNORMAL LOW (ref 0.1–1.0)
Monocytes Relative: 0 % — ABNORMAL LOW (ref 3–12)
Neutro Abs: 4.6 10*3/uL (ref 1.7–7.7)
Neutrophils Relative %: 9 % — ABNORMAL LOW (ref 43–77)
PLATELETS: 134 10*3/uL — AB (ref 150–400)
Promyelocytes Absolute: 0 %
RBC: 3.28 MIL/uL — ABNORMAL LOW (ref 4.22–5.81)
RDW: 16.6 % — ABNORMAL HIGH (ref 11.5–15.5)
WBC: 51.5 10*3/uL (ref 4.0–10.5)
nRBC: 0 /100 WBC

## 2013-12-21 LAB — COMPREHENSIVE METABOLIC PANEL
ALBUMIN: 3.4 g/dL — AB (ref 3.5–5.2)
ALK PHOS: 77 U/L (ref 39–117)
ALT: 19 U/L (ref 0–53)
AST: 25 U/L (ref 0–37)
Anion gap: 11 (ref 5–15)
BUN: 21 mg/dL (ref 6–23)
CO2: 23 mEq/L (ref 19–32)
Calcium: 9 mg/dL (ref 8.4–10.5)
Chloride: 107 mEq/L (ref 96–112)
Creatinine, Ser: 1.5 mg/dL — ABNORMAL HIGH (ref 0.50–1.35)
GFR calc Af Amer: 50 mL/min — ABNORMAL LOW (ref >90)
GFR calc non Af Amer: 43 mL/min — ABNORMAL LOW (ref >90)
Glucose, Bld: 159 mg/dL — ABNORMAL HIGH (ref 70–99)
POTASSIUM: 3.8 meq/L (ref 3.7–5.3)
SODIUM: 141 meq/L (ref 137–147)
TOTAL PROTEIN: 7.6 g/dL (ref 6.0–8.3)
Total Bilirubin: 0.4 mg/dL (ref 0.3–1.2)

## 2013-12-21 MED ORDER — HEPARIN SOD (PORK) LOCK FLUSH 100 UNIT/ML IV SOLN
500.0000 [IU] | Freq: Once | INTRAVENOUS | Status: AC
  Administered 2013-12-21: 500 [IU] via INTRAVENOUS

## 2013-12-21 MED ORDER — SODIUM CHLORIDE 0.9 % IJ SOLN
10.0000 mL | INTRAMUSCULAR | Status: DC | PRN
  Administered 2013-12-21: 10 mL via INTRAVENOUS

## 2013-12-21 NOTE — Progress Notes (Signed)
CRITICAL VALUE ALERT Critical value received: WBC Date of notification:  12/21/13 Time of notification: 0930 Critical value read back:  Yes.   Nurse who received alert:  ty MD notified (1st page):  Dr. Barnet Glasgow notified.

## 2013-12-21 NOTE — Progress Notes (Signed)
Randall Reyes presented for Portacath access and flush. Portacath located rt chest wall accessed with  H 20 needle. Flushes easily but no blood return present. Portacath flushed with 17ml NS and 500U/23ml Heparin and needle removed intact. Procedure without incident. Patient tolerated procedure well.

## 2013-12-21 NOTE — Progress Notes (Signed)
Randall Reyes presented for labwork. Labs per MD order drawn via Peripheral Line 25 gauge needle inserted in rt ac.  Good blood return present. Procedure without incident.  Needle removed intact. Patient tolerated procedure well.

## 2013-12-22 ENCOUNTER — Other Ambulatory Visit (HOSPITAL_COMMUNITY): Payer: Self-pay | Admitting: Hematology and Oncology

## 2013-12-22 DIAGNOSIS — C9110 Chronic lymphocytic leukemia of B-cell type not having achieved remission: Secondary | ICD-10-CM

## 2013-12-22 MED ORDER — IDELALISIB 150 MG PO TABS: 150.0000 mg | ORAL_TABLET | Freq: Every day | ORAL | Status: DC

## 2013-12-28 ENCOUNTER — Encounter (HOSPITAL_BASED_OUTPATIENT_CLINIC_OR_DEPARTMENT_OTHER): Payer: Medicare Other | Admitting: Oncology

## 2013-12-28 ENCOUNTER — Telehealth (HOSPITAL_COMMUNITY): Payer: Self-pay

## 2013-12-28 VITALS — BP 119/62 | HR 79 | Temp 98.2°F | Resp 18 | Wt 139.5 lb

## 2013-12-28 DIAGNOSIS — B356 Tinea cruris: Secondary | ICD-10-CM

## 2013-12-28 DIAGNOSIS — B029 Zoster without complications: Secondary | ICD-10-CM

## 2013-12-28 MED ORDER — HYDROCODONE-ACETAMINOPHEN 5-325 MG PO TABS: 1.0000 | ORAL_TABLET | Freq: Four times a day (QID) | ORAL | Status: DC | PRN

## 2013-12-28 MED ORDER — FAMCICLOVIR 500 MG PO TABS: 500.0000 mg | ORAL_TABLET | Freq: Three times a day (TID) | ORAL | Status: DC

## 2013-12-28 NOTE — Progress Notes (Signed)
Walk -in - Noted rash - cluster of blisters on lower back that started last week.  Pain rated at 6 and radiates to the right & area burns as well.  Also has small area on right abdomen as well.  Wife has been using clobetasol ointment.

## 2013-12-28 NOTE — Telephone Encounter (Signed)
[facility] about the size of the bottom of a drink bottle on lower back area.  Started out firey red and what looked like tiny blisters.  Now area is black looking.

## 2013-12-28 NOTE — Patient Instructions (Signed)
[facility] Discharge Instructions  RECOMMENDATIONS MADE BY THE CONSULTANT AND ANY TEST RESULTS WILL BE SENT TO YOUR REFERRING PHYSICIAN.  EXAM FINDINGS BY THE PHYSICIAN TODAY AND SIGNS OR SYMPTOMS TO REPORT TO CLINIC OR PRIMARY PHYSICIAN: Exam and findings as discussed by Robynn Pane, PA- C.  You probably have shingles again.  Don't use the clobetasol on the areas on your back or stomach but you can use it in the groin area.  MEDICATIONS PRESCRIBED:  Famvir 500 mg three times daily for 10 days Hydrocodone - take as directed for pain.  INSTRUCTIONS/FOLLOW-UP: As scheduled.  Thank you for choosing [facility] to provide your oncology and hematology care.  To afford each patient quality time with our providers, please arrive at least 15 minutes before your scheduled appointment time.  With your help, our goal is to use those 15 minutes to complete the necessary work-up to ensure our physicians have the information they need to help with your evaluation and healthcare recommendations.    Effective January 1st, 2014, we ask that you re-schedule your appointment with our physicians should you arrive 10 or more minutes late for your appointment.  We strive to give you quality time with our providers, and arriving late affects you and other patients whose appointments are after yours.    Again, thank you for choosing [facility].  Our hope is that these requests will decrease the amount of time that you wait before being seen by our physicians.       _____________________________________________________________  Should you have questions after your visit to [facility], please contact our office at [phone] between the hours of 8:30 a.m. and 4:30 p.m.  Voicemails left after 4:30 p.m. will not be returned until the following business day.  For prescription refill requests, have your pharmacy contact our office with your prescription  refill request.    _______________________________________________________________  We hope that we have given you very good care.  You may receive a patient satisfaction survey in the mail, please complete it and return it as soon as possible.  We value your feedback!  _______________________________________________________________  Have you asked about our STAR program?  STAR stands for Survivorship Training and Rehabilitation, and this is a nationally recognized cancer care program that focuses on survivorship and rehabilitation.  Cancer and cancer treatments may cause problems, such as, pain, making you feel tired and keeping you from doing the things that you need or want to do. Cancer rehabilitation can help. Our goal is to reduce these troubling effects and help you have the best quality of life possible.  You may receive a survey from a nurse that asks questions about your current state of health.  Based on the survey results, all eligible patients will be referred to the [facility] program for an evaluation so we can better serve you!  A frequently asked questions sheet is available upon request. Shingles Shingles (herpes zoster) is an infection that is caused by the same virus that causes chickenpox (varicella). The infection causes a painful skin rash and fluid-filled blisters, which eventually break open, crust over, and heal. It may occur in any area of the body, but it usually affects only one side of the body or face. The pain of shingles usually lasts about 1 month. However, some people with shingles may develop long-term (chronic) pain in the affected area of the body. Shingles often occurs many years after the person had chickenpox.  It is more common:  In people older than 50 years.  In people with weakened immune systems, such as those with HIV, AIDS, or cancer.  In people taking medicines that weaken the immune system, such as transplant medicines.  In people under great  stress. CAUSES  Shingles is caused by the varicella zoster virus (VZV), which also causes chickenpox. After a person is infected with the virus, it can remain in the person's body for years in an inactive state (dormant). To cause shingles, the virus reactivates and breaks out as an infection in a nerve root. The virus can be spread from person to person (contagious) through contact with open blisters of the shingles rash. It will only spread to people who have not had chickenpox. When these people are exposed to the virus, they may develop chickenpox. They will not develop shingles. Once the blisters scab over, the person is no longer contagious and cannot spread the virus to others. SIGNS AND SYMPTOMS  Shingles shows up in stages. The initial symptoms may be pain, itching, and tingling in an area of the skin. This pain is usually described as burning, stabbing, or throbbing.In a few days or weeks, a painful red rash will appear in the area where the pain, itching, and tingling were felt. The rash is usually on one side of the body in a band or belt-like pattern. Then, the rash usually turns into fluid-filled blisters. They will scab over and dry up in approximately 2-3 weeks. Flu-like symptoms may also occur with the initial symptoms, the rash, or the blisters. These may include:  Fever.  Chills.  Headache.  Upset stomach. DIAGNOSIS  Your health care provider will perform a skin exam to diagnose shingles. Skin scrapings or fluid samples may also be taken from the blisters. This sample will be examined under a microscope or sent to a lab for further testing. TREATMENT  There is no specific cure for shingles. Your health care provider will likely prescribe medicines to help you manage the pain, recover faster, and avoid long-term problems. This may include antiviral drugs, anti-inflammatory drugs, and pain medicines. HOME CARE INSTRUCTIONS   Take a cool bath or apply cool compresses to the area  of the rash or blisters as directed. This may help with the pain and itching.   Take medicines only as directed by your health care provider.   Rest as directed by your health care provider.  Keep your rash and blisters clean with mild soap and cool water or as directed by your health care provider.  Do not pick your blisters or scratch your rash. Apply an anti-itch cream or numbing creams to the affected area as directed by your health care provider.  Keep your shingles rash covered with a loose bandage (dressing).  Avoid skin contact with:  Babies.   Pregnant women.   Children with eczema.   Elderly people with transplants.   People with chronic illnesses, such as leukemia or AIDS.   Wear loose-fitting clothing to help ease the pain of material rubbing against the rash.  Keep all follow-up visits as directed by your health care provider.If the area involved is on your face, you may receive a referral for a specialist, such as an eye doctor (ophthalmologist) or an ear, nose, and throat (ENT) doctor. Keeping all follow-up visits will help you avoid eye problems, chronic pain, or disability.  SEEK IMMEDIATE MEDICAL CARE IF:   You have facial pain, pain around the eye area, or loss  of feeling on one side of your face.  You have ear pain or ringing in your ear.  You have loss of taste.  Your pain is not relieved with prescribed medicines.   Your redness or swelling spreads.   You have more pain and swelling.  Your condition is worsening or has changed.   You have a fever. MAKE SURE YOU:  Understand these instructions.  Will watch your condition.  Will get help right away if you are not doing well or get worse. Document Released: 04/30/2005 Document Revised: 09/14/2013 Document Reviewed: 12/13/2011 [facility] Patient Information 2015 [place], [place]. This information is not intended to replace advice given to you by your health care provider. Make sure you  discuss any questions you have with your health care provider.

## 2013-12-28 NOTE — Telephone Encounter (Signed)
Per wife, patient also has rash on abdomen and crotch area.  Has been putting a "salve" that Dr. Nevada Crane gave them on all of the areas.  Discussed with PA and patient is to come in for evaluation.  Appointment made for 11am.  Wife verbalized understanding of instructions.

## 2013-12-28 NOTE — Progress Notes (Signed)
Randall Reyes is seen as a work-in today for a reported "rash."  Randall Reyes reports that she noted the rash on Friday, but he admits that he noted it a few days prior.  He reports that it burns with radiation of the pain anteriorly.   The rash is "on my back."    He also notes a rash in the groin area.  Randall Reyes has been administering anti-fungal cream to all of the areas of rash, with improvement in the groin rash but no improvement elsewhere.  Exam: The patient has a low back, favoring the right side, eschar-like rash with clear borders and vesicles within the rash area.  Additionally, anteriorly on the right is a small vesicular clump (2) that is tender.  Within the groin area is a well demarcated rash that is erythematous with pearly borders  Assessment: 1. Herpes Zoster of dermatome T11 or T12 2. Herpetic pain and neuropathy  3. Tinea of bilateral groin  Plan: 1. Continue anti-fungal cream of groin 2. Rx for Famvir 500 mg TID x 10 days with 1 refill 3. Rx for Hydrocodone for pain 4. Continue to hold Zydelig 5. Return as scheduled in 1 week.   Patient and plan discussed with Dr. Farrel Gobble and he is in agreement with the aforementioned.   KEFALAS,THOMAS

## 2014-01-03 NOTE — Progress Notes (Signed)
[name], MD [place] [address]  CLL (chronic lymphocytic leukemia)  Herpes zoster - Plan: acyclovir (ZOVIRAX) 400 MG tablet, HYDROcodone-acetaminophen (NORCO) 5-325 MG per tablet  Post herpetic neuralgia - Plan: HYDROcodone-acetaminophen (NORCO) 5-325 MG per tablet  Tinea of groin - Plan: fluconazole (DIFLUCAN) 200 MG tablet  CURRENT THERAPY: Miscommunication and patient has held Idelalisib due to transaminitis.  Started Idelalisib 150 mg daily on 07/31/13 x 7 days and then Idelalisib 150 mg BID beginning on 08/07/2013.  Will restart Idelalisib at 100 mg BID.   INTERVAL HISTORY: JEROLD YOSS 78 y.o. male returns for  regular  visit for followup of CLL (without 11q or 17 p deletion) presenting with stage 0 disease in October 2006 however by the end of June 2010 his white count risen to 180,000, beta-2 microglobulin level was up to 8.06, hemoglobin has been dropping gradually but steadily and he was significantly hypogammaglobulinemic. His LDH was elevated. His lymphocyte count reached 175,000 and he had developed diffuse lymphadenopathy. He become symptomatic. We treated him with 3 cycles of fludarabine, Cytoxan, and Rituxan. He then developed significant pancytopenia requiring discontinuation of therapy. He has not require therapy since. Imbruvica started for clinical findings demonstrating progressive CLL on 06/22/2013, however, medication placed on hold for suspected Imbruvica-induced violaceous rash of right posterior leg. Started Idelalisib on 07/31/2013.     CLL (chronic lymphocytic leukemia)   02/14/2005 Procedure Peripheral flow cytometry- CLL (without 11q or 17 p deletion) presenting with stage 0 disease   10/25/2008 Progression Increasing WBC and symptoms suggestive of progressive disease   11/09/2008 - 01/07/2009 Chemotherapy Rituxan, fludarabine, cytoxan x 3 cycles with the discontinuation of fludarabine and cytoxan due to severe pancytopenia requiring  hospitalization.   01/10/2009 Adverse Reaction Severe pancytopenia and adverse side effects   02/01/2009 - 04/01/2009 Chemotherapy Rituxan every 21 days x 3 cycles   04/02/2009 Remission Stable disease.  Chemotherapy holiday started   06/22/2013 - 06/29/2013 Chemotherapy Imbruvica daily   06/29/2013 Adverse Reaction Large right posterior leg (popliteal) ecchymosis/violaceous rash.  Likely Imbruvica-induced   07/31/2013 - 10/12/2013 Chemotherapy Zydelig (idelalisib) 150 mg BID monotherapy   10/12/2013 Adverse Reaction Transaminitis, suspected to be secondary to [place].  Dose decreased.   10/12/2013 - 11/09/2013 Chemotherapy Zydelig (idelalisib) 150 mg daily.   11/09/2013 Adverse Reaction Worsening transaminitis.  Zydelig on HOLD   I saw the patient last week as a work-in. He was diagnosed with Herpes Zoster outbreak and he is now under active treatment with Famvir.  Today, his zoster is improved and he reports continued discomfort of the affected dermatome.  However, he now has new pustular rash super imposed upon a diffuse chest and trunk rash tat is not clearly demarcated.  He reports that he started Famvir on Tuesday evening and the rash began on Wednesday.    I personally reviewed and went over laboratory results with the patient.  The results are noted within this dictation.  [name] was suppose to be taking Idelalisib 150 mg daily, but he reports that he has not been taking the medication because he thought he was not suppose to.  Given his transaminitis while on full dose Idelalisib.  Therefore, we will restart Idelalisib 100 mg BID (reduced dose) in the future.   Hematologically, he denies any complaints and ROS questioning is negative.    Past Medical History  Diagnosis Date  . Diabetes mellitus   . Leukemia 6.22.2012    PER PATIENT  . Hypertension   .  Ulcer   . Anemia   . CLL (chronic lymphocytic leukemia) 01/24/2011  . Melanoma in situ 01/24/2011  . DM (diabetes mellitus) 01/24/2011  . Mild  obesity 01/24/2011  . Eczema 01/24/2011  . Port catheter in place 05/02/2012  . Shingles     has CLL (chronic lymphocytic leukemia); Melanoma in situ; DM (diabetes mellitus); Mild obesity; Eczema; and Port catheter in place on his problem list.     is allergic to imbruvica; aspirin; and ibuprofen.  Mr. Bonser had no medications administered during this visit.  Past Surgical History  Procedure Laterality Date  . Hernia repair  2001  . Bleeding ulcer    . Portacath placement  2008  . Cataract extraction w/phaco  05/05/2012    Procedure: CATARACT EXTRACTION PHACO AND INTRAOCULAR LENS PLACEMENT (IOC);  Surgeon: Tonny Branch, MD;  Location: [facility];  Service: Ophthalmology;  Laterality: Right;  CDE:13.25  . Cataract extraction w/phaco  05/15/2012    Procedure: CATARACT EXTRACTION PHACO AND INTRAOCULAR LENS PLACEMENT (IOC);  Surgeon: Tonny Branch, MD;  Location: [facility];  Service: Ophthalmology;  Laterality: Left;  CDE:  12.32    Denies any headaches, dizziness, double vision, fevers, chills, night sweats, nausea, vomiting, diarrhea, constipation, chest pain, heart palpitations, shortness of breath, blood in stool, black tarry stool, urinary pain, urinary burning, urinary frequency, hematuria.   PHYSICAL EXAMINATION  ECOG PERFORMANCE STATUS: 1 - Symptomatic but completely ambulatory  Filed Vitals:   01/04/14 0800  BP: 125/68  Pulse: 97  Temp: 97.7 F (36.5 C)  Resp: 18    GENERAL:alert, healthy, well nourished, well developed, comfortable, cooperative and smiling SKIN: skin color, texture, turgor are normal, positive for: a poorly demarcated erythematous rash on trunk and back that is blanchable with small pustules that have lysed, superimposed upon a T12 zoster outbreak. HEAD: Normocephalic, No masses, lesions, tenderness or abnormalities EYES: normal, PERRLA, EOMI, Conjunctiva are pink and non-injected EARS: External ears normal OROPHARYNX:mucous membranes are moist  NECK: supple,  trachea midline LYMPH:  not examined BREAST:not examined LUNGS: not examined HEART: not examined ABDOMEN:not examined BACK: Back symmetric, no curvature. EXTREMITIES:less then 2 second capillary refill, no joint deformities, effusion, or inflammation, no skin discoloration, positive findings:  Left leg well-demaracted macules with a whitish glean NEURO: alert & oriented x 3 with fluent speech, no focal motor/sensory deficits, gait normal   LABORATORY DATA: CBC    Component Value Date/Time   WBC 51.5* 12/21/2013 0830   RBC 3.28* 12/21/2013 0830   HGB 12.0* 12/21/2013 0830   HCT 35.5* 12/21/2013 0830   PLT 134* 12/21/2013 0830   MCV 108.2* 12/21/2013 0830   MCH 36.6* 12/21/2013 0830   MCHC 33.8 12/21/2013 0830   RDW 16.6* 12/21/2013 0830   LYMPHSABS 46.9* 12/21/2013 0830   MONOABS 0.0* 12/21/2013 0830   EOSABS 0.0 12/21/2013 0830   BASOSABS 0.0 12/21/2013 0830      Chemistry      Component Value Date/Time   NA 141 12/21/2013 0830   K 3.8 12/21/2013 0830   CL 107 12/21/2013 0830   CO2 23 12/21/2013 0830   BUN 21 12/21/2013 0830   CREATININE 1.50* 12/21/2013 0830      Component Value Date/Time   CALCIUM 9.0 12/21/2013 0830   ALKPHOS 77 12/21/2013 0830   AST 25 12/21/2013 0830   ALT 19 12/21/2013 0830   BILITOT 0.4 12/21/2013 0830        ASSESSMENT:  1. CLL (without 11q or 17 p deletion) presenting with stage  0 disease in October 2006 however by the end of June 2010 his white count risen to 180,000, beta-2 microglobulin level was up to 8.06, hemoglobin has been dropping gradually but steadily and he was significantly hypogammaglobulinemic. His LDH was elevated. His lymphocyte count reached 175,000 and he had developed diffuse lymphadenopathy. He become symptomatic. We treated him with 3 cycles of fludarabine, Cytoxan, and Rituxan. He then developed significant pancytopenia requiring discontinuation of therapy. He has not require therapy since. Imbruvica started for clinical findings  demonstrating progressive CLL on 06/22/2013, however, medication placed on hold for suspected Imbruvica-induced violaceous rash of right posterior leg. Started Idelalisib on 07/31/2013.  2. Suspected Imbruvica-induced violaceous rash of posterior right leg.  3. Transaminitis, secondary to Zydelig, dose reduced on 10/12/2013, but patient admits that he has bit been taking the medication. 4. Herpes Zoster Outbreak following dermatome T11 or T12 5. Herpetic pain and neuropathy  6. Tinea of bilateral groin and left leg 7. Pustular rash extraneous to T12 dermatome with rash  Patient Active Problem List   Diagnosis Date Noted  . Port catheter in place 05/02/2012  . CLL (chronic lymphocytic leukemia) 01/24/2011  . Melanoma in situ 01/24/2011  . DM (diabetes mellitus) 01/24/2011  . Mild obesity 01/24/2011  . Eczema 01/24/2011     PLAN:  1. I personally reviewed and went over laboratory results with the patient.  The results are noted within this dictation. 2. Continue anti-fungal cream to groin 3. Hold Famvir 4. Rx for Acyclovir 400 mg 5 x daily x 10 day +1 refill 5. Rx for Hydrocodone 6. Rx for Diflucan 200 mg daily #15 with 1 refill 7. Return in 7-10 days for follow-up of rash   THERAPY PLAN:  He started Idelalisib 150 mg daily on 07/31/2013. On 08/07/2013, he increased the dose to 150 mg BID (maximum dose). He subsequently experienced transaminitis and the medication was held.  Transaminitis has resolved and we will start Idelalisib at 100 mg BID in the future.  We will monitor CBC diff, CMET every 2 weeks x 3 months (November/December 2015), then every 4 weeks x 3 months (finishing in Feb/March 2016) , and then every 1-3 months thereafter per the PI. He is educated about the warnings and precautions including hepatotoxicity, severe diarrhea and colitis, pneumonitis, intestinal perforation, severe cutaneous rash, anaphylaxis, and neutropenia.    All questions were answered. The patient knows to  call the clinic with any problems, questions or concerns. We can certainly see the patient much sooner if necessary.  Patient and plan discussed with Dr. Farrel Gobble and he is in agreement with the aforementioned.   Derita Michelsen 01/04/2014

## 2014-01-04 ENCOUNTER — Encounter (HOSPITAL_BASED_OUTPATIENT_CLINIC_OR_DEPARTMENT_OTHER): Payer: Medicare Other

## 2014-01-04 ENCOUNTER — Encounter (HOSPITAL_COMMUNITY): Payer: Self-pay | Admitting: Oncology

## 2014-01-04 ENCOUNTER — Encounter (HOSPITAL_BASED_OUTPATIENT_CLINIC_OR_DEPARTMENT_OTHER): Payer: Medicare Other | Admitting: Oncology

## 2014-01-04 VITALS — BP 125/68 | HR 97 | Temp 97.7°F | Resp 18 | Wt 140.8 lb

## 2014-01-04 DIAGNOSIS — B356 Tinea cruris: Secondary | ICD-10-CM

## 2014-01-04 DIAGNOSIS — C9110 Chronic lymphocytic leukemia of B-cell type not having achieved remission: Secondary | ICD-10-CM | POA: Diagnosis not present

## 2014-01-04 DIAGNOSIS — B029 Zoster without complications: Secondary | ICD-10-CM

## 2014-01-04 DIAGNOSIS — B0229 Other postherpetic nervous system involvement: Secondary | ICD-10-CM

## 2014-01-04 LAB — COMPREHENSIVE METABOLIC PANEL
ALT: 27 U/L (ref 0–53)
AST: 20 U/L (ref 0–37)
Albumin: 3.2 g/dL — ABNORMAL LOW (ref 3.5–5.2)
Alkaline Phosphatase: 84 U/L (ref 39–117)
Anion gap: 12 (ref 5–15)
BUN: 21 mg/dL (ref 6–23)
CO2: 23 meq/L (ref 19–32)
CREATININE: 1.41 mg/dL — AB (ref 0.50–1.35)
Calcium: 8.7 mg/dL (ref 8.4–10.5)
Chloride: 101 mEq/L (ref 96–112)
GFR, EST AFRICAN AMERICAN: 54 mL/min — AB (ref >90)
GFR, EST NON AFRICAN AMERICAN: 47 mL/min — AB (ref >90)
GLUCOSE: 160 mg/dL — AB (ref 70–99)
Potassium: 4.2 mEq/L (ref 3.7–5.3)
SODIUM: 136 meq/L — AB (ref 137–147)
TOTAL PROTEIN: 7.8 g/dL (ref 6.0–8.3)
Total Bilirubin: 0.4 mg/dL (ref 0.3–1.2)

## 2014-01-04 LAB — CBC WITH DIFFERENTIAL/PLATELET
BAND NEUTROPHILS: 0 % (ref 0–10)
BLASTS: 0 %
Basophils Absolute: 0 10*3/uL (ref 0.0–0.1)
Basophils Relative: 0 % (ref 0–1)
EOS ABS: 3.4 10*3/uL — AB (ref 0.0–0.7)
EOS PCT: 7 % — AB (ref 0–5)
HEMATOCRIT: 34.8 % — AB (ref 39.0–52.0)
Hemoglobin: 11.9 g/dL — ABNORMAL LOW (ref 13.0–17.0)
LYMPHS ABS: 35.8 10*3/uL — AB (ref 0.7–4.0)
LYMPHS PCT: 74 % — AB (ref 12–46)
MCH: 36.5 pg — ABNORMAL HIGH (ref 26.0–34.0)
MCHC: 34.2 g/dL (ref 30.0–36.0)
MCV: 106.7 fL — AB (ref 78.0–100.0)
METAMYELOCYTES PCT: 0 %
MONO ABS: 2.4 10*3/uL — AB (ref 0.1–1.0)
Monocytes Relative: 5 % (ref 3–12)
Myelocytes: 0 %
NRBC: 0 /100{WBCs}
Neutro Abs: 6.8 10*3/uL (ref 1.7–7.7)
Neutrophils Relative %: 14 % — ABNORMAL LOW (ref 43–77)
Platelets: 120 10*3/uL — ABNORMAL LOW (ref 150–400)
Promyelocytes Absolute: 0 %
RBC: 3.26 MIL/uL — AB (ref 4.22–5.81)
RDW: 16.3 % — AB (ref 11.5–15.5)
WBC: 48.4 10*3/uL — AB (ref 4.0–10.5)

## 2014-01-04 MED ORDER — HYDROCODONE-ACETAMINOPHEN 5-325 MG PO TABS
1.0000 | ORAL_TABLET | Freq: Four times a day (QID) | ORAL | Status: DC | PRN
Start: 2014-01-04 — End: 2014-08-19

## 2014-01-04 MED ORDER — FLUCONAZOLE 200 MG PO TABS
200.0000 mg | ORAL_TABLET | Freq: Every day | ORAL | Status: DC
Start: 2014-01-04 — End: 2014-04-22

## 2014-01-04 MED ORDER — ACYCLOVIR 400 MG PO TABS: 400.0000 mg | ORAL_TABLET | Freq: Every day | ORAL | Status: DC

## 2014-01-04 NOTE — Patient Instructions (Signed)
[facility] Discharge Instructions  RECOMMENDATIONS MADE BY THE CONSULTANT AND ANY TEST RESULTS WILL BE SENT TO YOUR REFERRING PHYSICIAN.  EXAM FINDINGS BY THE PHYSICIAN TODAY AND SIGNS OR SYMPTOMS TO REPORT TO CLINIC OR PRIMARY PHYSICIAN: Exam and findings as discussed by Robynn Pane, PA-C.  MEDICATIONS PRESCRIBED:  Stop the famvir Acyclovir 400mg  5 times daily Diflucan 200mg  daily Hydrocodone - take as directed for pain.  INSTRUCTIONS/FOLLOW-UP: Follow-up in 7 - 10 days.  Thank you for choosing [facility] to provide your oncology and hematology care.  To afford each patient quality time with our providers, please arrive at least 15 minutes before your scheduled appointment time.  With your help, our goal is to use those 15 minutes to complete the necessary work-up to ensure our physicians have the information they need to help with your evaluation and healthcare recommendations.    Effective January 1st, 2014, we ask that you re-schedule your appointment with our physicians should you arrive 10 or more minutes late for your appointment.  We strive to give you quality time with our providers, and arriving late affects you and other patients whose appointments are after yours.    Again, thank you for choosing [facility].  Our hope is that these requests will decrease the amount of time that you wait before being seen by our physicians.       _____________________________________________________________  Should you have questions after your visit to [facility], please contact our office at [phone] between the hours of 8:30 a.m. and 4:30 p.m.  Voicemails left after 4:30 p.m. will not be returned until the following business day.  For prescription refill requests, have your pharmacy contact our office with your prescription refill request.    _______________________________________________________________  We hope that  we have given you very good care.  You may receive a patient satisfaction survey in the mail, please complete it and return it as soon as possible.  We value your feedback!  _______________________________________________________________  Have you asked about our STAR program?  STAR stands for Survivorship Training and Rehabilitation, and this is a nationally recognized cancer care program that focuses on survivorship and rehabilitation.  Cancer and cancer treatments may cause problems, such as, pain, making you feel tired and keeping you from doing the things that you need or want to do. Cancer rehabilitation can help. Our goal is to reduce these troubling effects and help you have the best quality of life possible.  You may receive a survey from a nurse that asks questions about your current state of health.  Based on the survey results, all eligible patients will be referred to the [facility] program for an evaluation so we can better serve you!  A frequently asked questions sheet is available upon request.

## 2014-01-04 NOTE — Progress Notes (Signed)
Labs dawn for cbcd,cmp

## 2014-01-11 ENCOUNTER — Encounter (HOSPITAL_BASED_OUTPATIENT_CLINIC_OR_DEPARTMENT_OTHER): Payer: Medicare Other

## 2014-01-11 ENCOUNTER — Encounter (HOSPITAL_COMMUNITY): Payer: Self-pay

## 2014-01-11 VITALS — BP 97/60 | HR 75 | Temp 99.0°F | Resp 18 | Wt 133.7 lb

## 2014-01-11 DIAGNOSIS — H16209 Unspecified keratoconjunctivitis, unspecified eye: Secondary | ICD-10-CM

## 2014-01-11 DIAGNOSIS — C9110 Chronic lymphocytic leukemia of B-cell type not having achieved remission: Secondary | ICD-10-CM | POA: Diagnosis not present

## 2014-01-11 DIAGNOSIS — B029 Zoster without complications: Secondary | ICD-10-CM

## 2014-01-11 DIAGNOSIS — H16203 Unspecified keratoconjunctivitis, bilateral: Secondary | ICD-10-CM

## 2014-01-11 DIAGNOSIS — B356 Tinea cruris: Secondary | ICD-10-CM

## 2014-01-11 LAB — CBC WITH DIFFERENTIAL/PLATELET
BASOS ABS: 0 10*3/uL (ref 0.0–0.1)
BASOS PCT: 0 % (ref 0–1)
Band Neutrophils: 0 % (ref 0–10)
Blasts: 0 %
EOS ABS: 1.6 10*3/uL — AB (ref 0.0–0.7)
Eosinophils Relative: 2 % (ref 0–5)
HCT: 39.8 % (ref 39.0–52.0)
HEMOGLOBIN: 13.1 g/dL (ref 13.0–17.0)
LYMPHS ABS: 65 10*3/uL — AB (ref 0.7–4.0)
LYMPHS PCT: 81 % — AB (ref 12–46)
MCH: 36.2 pg — ABNORMAL HIGH (ref 26.0–34.0)
MCHC: 32.9 g/dL (ref 30.0–36.0)
MCV: 109.9 fL — ABNORMAL HIGH (ref 78.0–100.0)
MONO ABS: 3.2 10*3/uL — AB (ref 0.1–1.0)
MONOS PCT: 4 % (ref 3–12)
MYELOCYTES: 0 %
Metamyelocytes Relative: 0 %
NEUTROS ABS: 10.4 10*3/uL — AB (ref 1.7–7.7)
NEUTROS PCT: 13 % — AB (ref 43–77)
NRBC: 0 /100{WBCs}
PLATELETS: 173 10*3/uL (ref 150–400)
Promyelocytes Absolute: 0 %
RBC: 3.62 MIL/uL — AB (ref 4.22–5.81)
RDW: 17 % — ABNORMAL HIGH (ref 11.5–15.5)
WBC: 80.2 10*3/uL — AB (ref 4.0–10.5)

## 2014-01-11 MED ORDER — TRIFLURIDINE 1 % OP SOLN: OPHTHALMIC | Status: DC

## 2014-01-11 NOTE — Progress Notes (Signed)
[facility] OFFICE PROGRESS NOTE  [name], MD [place] [address]  DIAGNOSIS: CLL (chronic lymphocytic leukemia) - Plan: CBC with Differential, CBC with Differential  Herpes zoster  Tinea of groin  Keratoconjunctivitis of both eyes  Chief Complaint  Patient presents with  . CLL  . Rash  . Herpes Zoster    CURRENT THERAPY: Acyclovir 5 times daily along with Diflucan 200 mg daily. ZYDELIG has been held when herpes zoster was diagnosed on 12/28/2013. Trial of Famvir resulted in only a partial response so acyclovir was started on 01/03/2014 along with Diflucan 200 mg daily because of additional lesions on his lower extremities, groin, and back suggestive of Tinea.  INTERVAL HISTORY: Randall Reyes 78 y.o. male returns for followup of chronic lymphocytic leukemia having developed concurrent herpes zoster and Tinea which treatment utilizing acyclovir plus Diflucan started one week ago after failure of Famvir.  Encrustations have worsened on his back with development of keratoconjunctivitis. Rashes HEENT sometimes painful. Back rash is the most painful. No nausea, vomiting, or diarrhea. No fever or chills. No cough, sore throat, shortness of breath, easy satiety, or dysphagia.   MEDICAL HISTORY: Past Medical History  Diagnosis Date  . Diabetes mellitus   . Leukemia 6.22.2012    PER PATIENT  . Hypertension   . Ulcer   . Anemia   . CLL (chronic lymphocytic leukemia) 01/24/2011  . Melanoma in situ 01/24/2011  . DM (diabetes mellitus) 01/24/2011  . Mild obesity 01/24/2011  . Eczema 01/24/2011  . Port catheter in place 05/02/2012  . Shingles     INTERIM HISTORY: has CLL (chronic lymphocytic leukemia); Melanoma in situ; DM (diabetes mellitus); Mild obesity; Eczema; and Port catheter in place on his problem list.   CLL (without 11q or 17 p deletion) presenting with stage 0 disease in October 2006 however by the  end of June 2010 his white count risen to 180,000, beta-2 microglobulin level was up to 8.06, hemoglobin has been dropping gradually but steadily and he was significantly hypogammaglobulinemic. His LDH was elevated. His lymphocyte count reached 175,000 and he had developed diffuse lymphadenopathy. He become symptomatic. We treated him with 3 cycles of fludarabine, Cytoxan, and Rituxan. He then developed significant pancytopenia requiring discontinuation of therapy. He has not require therapy since. Imbruvica started for clinical findings demonstrating progressive CLL on 06/22/2013, however, medication placed on hold for suspected Imbruvica-induced violaceous rash of right posterior leg. Started Idelalisib on 07/31/2013. Dosage was decreased to 2 transaminitis with demonstration 3 weeks ago of herpes zoster compounded by tinea. Placed on acyclovir because of Famvir failure along with Diflucan on 01/03/2014.   CLL (chronic lymphocytic leukemia)    02/14/2005  Procedure  Peripheral flow cytometry- CLL (without 11q or 17 p deletion) presenting with stage 0 disease    10/25/2008  Progression  Increasing WBC and symptoms suggestive of progressive disease    11/09/2008 - 01/07/2009  Chemotherapy  Rituxan, fludarabine, cytoxan x 3 cycles with the discontinuation of fludarabine and cytoxan due to severe pancytopenia requiring hospitalization.    01/10/2009  Adverse Reaction  Severe pancytopenia and adverse side effects    02/01/2009 - 04/01/2009  Chemotherapy  Rituxan every 21 days x 3 cycles    04/02/2009  Remission  Stable disease. Chemotherapy holiday started    06/22/2013 - 06/29/2013  Chemotherapy  Imbruvica daily    06/29/2013  Adverse Reaction  Large right posterior leg (popliteal)  ecchymosis/violaceous rash. Likely Imbruvica-induced    07/31/2013 - 10/12/2013  Chemotherapy  Zydelig (idelalisib) 150 mg BID monotherapy    10/12/2013  Adverse Reaction  Transaminitis, suspected to be secondary to [place]. Dose decreased.     10/12/2013 - 11/09/2013  Chemotherapy  Zydelig (idelalisib) 150 mg daily.    11/09/2013  Adverse Reaction  Worsening transaminitis. Zydelig on HOLD      12/28/2013                 Herpes zoster           Famvir, incomplete response, developed Tinea    01/03/2014                 Herpes zoster, Tinea Acyclovir plus Diflucan started    01/11/2014                  Diss. Zoster              Viroptic o/x, ZIG 612m IM, acyclovir Diflucan  ALLERGIES:  is allergic to imbruvica; aspirin; and ibuprofen.  MEDICATIONS: has a current medication list which includes the following prescription(s): acetaminophen, acyclovir, docusate calcium, fluconazole, hydrocodone-acetaminophen, lisinopril, metformin, rosuvastatin, idelalisib, and trifluridine.  SURGICAL HISTORY:  Past Surgical History  Procedure Laterality Date  . Hernia repair  2001  . Bleeding ulcer    . Portacath placement  2008  . Cataract extraction w/phaco  05/05/2012    Procedure: CATARACT EXTRACTION PHACO AND INTRAOCULAR LENS PLACEMENT (IOC);  Surgeon: KTonny Branch MD;  Location: [facility];  Service: Ophthalmology;  Laterality: Right;  CDE:13.25  . Cataract extraction w/phaco  05/15/2012    Procedure: CATARACT EXTRACTION PHACO AND INTRAOCULAR LENS PLACEMENT (IOC);  Surgeon: KTonny Branch MD;  Location: [facility];  Service: Ophthalmology;  Laterality: Left;  CDE:  12.32    FAMILY HISTORY: family history includes Diabetes in his father.  SOCIAL HISTORY:  reports that he has quit smoking. He quit smokeless tobacco use about 46 years ago. He reports that he does not drink alcohol or use illicit drugs.  REVIEW OF SYSTEMS:  Other than that discussed above is noncontributory.  PHYSICAL EXAMINATION: ECOG PERFORMANCE STATUS: 1 - Symptomatic but completely ambulatory  Blood pressure 97/60, pulse 75, temperature 99 F (37.2 C), temperature source Oral, resp. rate 18, weight 133 lb 11.2 oz (60.646 kg), SpO2 95.00%.  GENERAL:alert, no distress and comfortable SKIN:  skin color, texture, turgor are normal, no rashes or significant lesions EYES: PERLA; bilateral conjunctival injection without exudate. Periorbital erythema. SINUSES: No redness or tenderness over maxillary or ethmoid sinuses OROPHARYNX:no exudate, no erythema on lips, buccal mucosa, or tongue. NECK: supple, thyroid normal size, non-tender, without nodularity. No masses CHEST: Posterior thorax with 1 cm areas of bolus encrusted lesions with erythema on the anterior chest. LYMPH:  no palpable lymphadenopathy in the cervical, axillary or inguinal LUNGS: clear to auscultation and percussion with normal breathing effort HEART: regular rate & rhythm and no murmurs. ABDOMEN:abdomen soft, non-tender and normal bowel sounds MUSCULOSKELETAL:no cyanosis of digits and no clubbing. Range of motion normal.  NEURO: alert & oriented x 3 with fluent speech, no focal motor/sensory deficits   LABORATORY DATA: Office Visit on 01/11/2014  Component Date Value Ref Range Status  . WBC 01/11/2014 80.2* 4.0 - 10.5 K/uL Final   PAGED T.KEFALAS 1740 NO REPLY,LEFT S.BURGESS MESSAGE 1750 ON [id number] THOMPSONS.  .Marland KitchenRBC 01/11/2014 3.62* 4.22 - 5.81 MIL/uL Final  . Hemoglobin 01/11/2014 13.1  13.0 - 17.0 g/dL Final  .  HCT 01/11/2014 39.8  39.0 - 52.0 % Final  . MCV 01/11/2014 109.9* 78.0 - 100.0 fL Final  . MCH 01/11/2014 36.2* 26.0 - 34.0 pg Final  . MCHC 01/11/2014 32.9  30.0 - 36.0 g/dL Final  . RDW 01/11/2014 17.0* 11.5 - 15.5 % Final  . Platelets 01/11/2014 173  150 - 400 K/uL Final  . Neutrophils Relative % 01/11/2014 13* 43 - 77 % Final  . Lymphocytes Relative 01/11/2014 81* 12 - 46 % Final  . Monocytes Relative 01/11/2014 4  3 - 12 % Final  . Eosinophils Relative 01/11/2014 2  0 - 5 % Final  . Basophils Relative 01/11/2014 0  0 - 1 % Final  . Band Neutrophils 01/11/2014 0  0 - 10 % Final  . Metamyelocytes Relative 01/11/2014 0   Final  . Myelocytes 01/11/2014 0   Final  . Promyelocytes Absolute  01/11/2014 0   Final  . Blasts 01/11/2014 0   Final  . nRBC 01/11/2014 0  0 /100 WBC Final  . Neutro Abs 01/11/2014 10.4* 1.7 - 7.7 K/uL Final  . Lymphs Abs 01/11/2014 65.0* 0.7 - 4.0 K/uL Final  . Monocytes Absolute 01/11/2014 3.2* 0.1 - 1.0 K/uL Final  . Eosinophils Absolute 01/11/2014 1.6* 0.0 - 0.7 K/uL Final  . Basophils Absolute 01/11/2014 0.0  0.0 - 0.1 K/uL Final  . WBC Morphology 01/11/2014 WHITE COUNT CONFIRMED ON SMEAR   Final   Comment: ABSOLUTE LYMPHOCYTOSIS                          ATYPICAL LYMPHOCYTES                          BLASTS                          SMUDGE CELLS                          ABSOLUTE LYMPHOCYTOSIS, CONSISTENT WITH CLL.  SUGGEST IMMUNOPHENOTYPING IF A NEW PERSISTENT FINDING.  Lab on 01/04/2014  Component Date Value Ref Range Status  . WBC 01/04/2014 48.4* 4.0 - 10.5 K/uL Final  . RBC 01/04/2014 3.26* 4.22 - 5.81 MIL/uL Final  . Hemoglobin 01/04/2014 11.9* 13.0 - 17.0 g/dL Final  . HCT 01/04/2014 34.8* 39.0 - 52.0 % Final  . MCV 01/04/2014 106.7* 78.0 - 100.0 fL Final  . MCH 01/04/2014 36.5* 26.0 - 34.0 pg Final  . MCHC 01/04/2014 34.2  30.0 - 36.0 g/dL Final  . RDW 01/04/2014 16.3* 11.5 - 15.5 % Final  . Platelets 01/04/2014 120* 150 - 400 K/uL Final  . Neutrophils Relative % 01/04/2014 14* 43 - 77 % Final  . Lymphocytes Relative 01/04/2014 74* 12 - 46 % Final  . Monocytes Relative 01/04/2014 5  3 - 12 % Final  . Eosinophils Relative 01/04/2014 7* 0 - 5 % Final  . Basophils Relative 01/04/2014 0  0 - 1 % Final  . Band Neutrophils 01/04/2014 0  0 - 10 % Final  . Metamyelocytes Relative 01/04/2014 0   Final  . Myelocytes 01/04/2014 0   Final  . Promyelocytes Absolute 01/04/2014 0   Final  . Blasts 01/04/2014 0   Final  . nRBC 01/04/2014 0  0 /100 WBC Final  . Neutro Abs 01/04/2014 6.8  1.7 - 7.7 K/uL Final  . Lymphs  Abs 01/04/2014 35.8* 0.7 - 4.0 K/uL Final  . Monocytes Absolute 01/04/2014 2.4* 0.1 - 1.0 K/uL Final  . Eosinophils Absolute  01/04/2014 3.4* 0.0 - 0.7 K/uL Final  . Basophils Absolute 01/04/2014 0.0  0.0 - 0.1 K/uL Final  . WBC Morphology 01/04/2014 ABSOLUTE LYMPHOCYTOSIS   Final   Comment: ATYPICAL LYMPHOCYTES                          SMUDGE CELLS  . Sodium 01/04/2014 136* 137 - 147 mEq/L Final  . Potassium 01/04/2014 4.2  3.7 - 5.3 mEq/L Final  . Chloride 01/04/2014 101  96 - 112 mEq/L Final  . CO2 01/04/2014 23  19 - 32 mEq/L Final  . Glucose, Bld 01/04/2014 160* 70 - 99 mg/dL Final  . BUN 01/04/2014 21  6 - 23 mg/dL Final  . Creatinine, Ser 01/04/2014 1.41* 0.50 - 1.35 mg/dL Final  . Calcium 01/04/2014 8.7  8.4 - 10.5 mg/dL Final  . Total Protein 01/04/2014 7.8  6.0 - 8.3 g/dL Final  . Albumin 01/04/2014 3.2* 3.5 - 5.2 g/dL Final  . AST 01/04/2014 20  0 - 37 U/L Final  . ALT 01/04/2014 27  0 - 53 U/L Final  . Alkaline Phosphatase 01/04/2014 84  39 - 117 U/L Final  . Total Bilirubin 01/04/2014 0.4  0.3 - 1.2 mg/dL Final  . GFR calc non Af Amer 01/04/2014 47* >90 mL/min Final  . GFR calc Af Amer 01/04/2014 54* >90 mL/min Final   Comment: (NOTE)                          The eGFR has been calculated using the CKD EPI equation.                          This calculation has not been validated in all clinical situations.                          eGFR's persistently <90 mL/min signify possible Chronic Kidney                          Disease.  . Anion gap 01/04/2014 12  5 - 15 Final  Lab on 12/21/2013  Component Date Value Ref Range Status  . WBC 12/21/2013 51.5* 4.0 - 10.5 K/uL Final   Comment: RESULT REPEATED AND VERIFIED                          WHITE COUNT CONFIRMED ON SMEAR                          CRITICAL RESULT CALLED TO, READ BACK BY AND VERIFIED WITH:                          Jeannette How RN ON [id number] AT 0930 BY RESSEGGER R  . RBC 12/21/2013 3.28* 4.22 - 5.81 MIL/uL Final  . Hemoglobin 12/21/2013 12.0* 13.0 - 17.0 g/dL Final  . HCT 12/21/2013 35.5* 39.0 - 52.0 % Final  . MCV 12/21/2013 108.2* 78.0  - 100.0 fL Final  . MCH 12/21/2013 36.6* 26.0 - 34.0 pg Final  . MCHC 12/21/2013 33.8  30.0 - 36.0 g/dL Final  . RDW  12/21/2013 16.6* 11.5 - 15.5 % Final  . Platelets 12/21/2013 134* 150 - 400 K/uL Final  . Neutrophils Relative % 12/21/2013 9* 43 - 77 % Final  . Lymphocytes Relative 12/21/2013 91* 12 - 46 % Final  . Monocytes Relative 12/21/2013 0* 3 - 12 % Final  . Eosinophils Relative 12/21/2013 0  0 - 5 % Final  . Basophils Relative 12/21/2013 0  0 - 1 % Final  . Band Neutrophils 12/21/2013 0  0 - 10 % Final  . Metamyelocytes Relative 12/21/2013 0   Final  . Myelocytes 12/21/2013 0   Final  . Promyelocytes Absolute 12/21/2013 0   Final  . Blasts 12/21/2013 0   Final  . nRBC 12/21/2013 0  0 /100 WBC Final  . Neutro Abs 12/21/2013 4.6  1.7 - 7.7 K/uL Final  . Lymphs Abs 12/21/2013 46.9* 0.7 - 4.0 K/uL Final  . Monocytes Absolute 12/21/2013 0.0* 0.1 - 1.0 K/uL Final  . Eosinophils Absolute 12/21/2013 0.0  0.0 - 0.7 K/uL Final  . Basophils Absolute 12/21/2013 0.0  0.0 - 0.1 K/uL Final  . WBC Morphology 12/21/2013 ABSOLUTE LYMPHOCYTOSIS   Final   Comment: ATYPICAL LYMPHOCYTES                          SMUDGE CELLS  . Sodium 12/21/2013 141  137 - 147 mEq/L Final  . Potassium 12/21/2013 3.8  3.7 - 5.3 mEq/L Final  . Chloride 12/21/2013 107  96 - 112 mEq/L Final  . CO2 12/21/2013 23  19 - 32 mEq/L Final  . Glucose, Bld 12/21/2013 159* 70 - 99 mg/dL Final  . BUN 12/21/2013 21  6 - 23 mg/dL Final  . Creatinine, Ser 12/21/2013 1.50* 0.50 - 1.35 mg/dL Final  . Calcium 12/21/2013 9.0  8.4 - 10.5 mg/dL Final  . Total Protein 12/21/2013 7.6  6.0 - 8.3 g/dL Final  . Albumin 12/21/2013 3.4* 3.5 - 5.2 g/dL Final  . AST 12/21/2013 25  0 - 37 U/L Final  . ALT 12/21/2013 19  0 - 53 U/L Final  . Alkaline Phosphatase 12/21/2013 77  39 - 117 U/L Final  . Total Bilirubin 12/21/2013 0.4  0.3 - 1.2 mg/dL Final  . GFR calc non Af Amer 12/21/2013 43* >90 mL/min Final  . GFR calc Af Amer 12/21/2013 50*  >90 mL/min Final   Comment: (NOTE)                          The eGFR has been calculated using the CKD EPI equation.                          This calculation has not been validated in all clinical situations.                          eGFR's persistently <90 mL/min signify possible Chronic Kidney                          Disease.  . Anion gap 12/21/2013 11  5 - 15 Final    PATHOLOGY: No new pathology.  Urinalysis    Component Value Date/Time   COLORURINE YELLOW 11/03/2010 0705   APPEARANCEUR CLEAR 11/03/2010 0705   LABSPEC 1.020 11/03/2010 0705   PHURINE 5.5 11/03/2010 0705   GLUCOSEU NEGATIVE 11/03/2010 8938  HGBUR SMALL* 11/03/2010 0705   BILIRUBINUR NEGATIVE 11/03/2010 0705   KETONESUR TRACE* 11/03/2010 0705   PROTEINUR TRACE* 11/03/2010 0705   UROBILINOGEN 0.2 11/03/2010 0705   NITRITE NEGATIVE 11/03/2010 0705   LEUKOCYTESUR NEGATIVE 11/03/2010 0705    RADIOGRAPHIC STUDIES: No results found.  ASSESSMENT:  #1. Disseminated herpes zoster with keratoconjunctivitis #2. Tinea corporis, improved. #3. Chronic leukocytic leukemia, currently off treatment.   PLAN:  #1. Viroptic solution 1 drop in each eye every 2 hours while awake. #2. Continue Diflucan and acyclovir. #3. Varicella-zoster immune globulin 625 mg IM when available. #4. Benadryl 25 mg every 6 hours with 50 mg at bedtime to control itching. #5. Continue hydrocodone for pain. #6. Followup in one week with CBC.   All questions were answered. The patient knows to call the clinic with any problems, questions or concerns. We can certainly see the patient much sooner if necessary.   I spent 30 minutes counseling the patient face to face. The total time spent in the appointment was 40 minutes.    Doroteo Bradford, MD 01/12/2014 6:38 AM  DISCLAIMER:  This note was dictated with voice recognition software.  Similar sounding words can inadvertently be transcribed inaccurately and may not be corrected upon review.

## 2014-01-11 NOTE — Patient Instructions (Addendum)
[facility] Discharge Instructions  RECOMMENDATIONS MADE BY THE CONSULTANT AND ANY TEST RESULTS WILL BE SENT TO YOUR REFERRING PHYSICIAN.  EXAM FINDINGS BY THE PHYSICIAN TODAY AND SIGNS OR SYMPTOMS TO REPORT TO CLINIC OR PRIMARY PHYSICIAN: Exam and findings as discussed by Dr. Barnet Glasgow.  Thinks that you have disseminated zoster more related to your disease.  Will give you an injection that we hope will increase your ability to fight off viral infections.   MEDICATIONS PRESCRIBED:  Benadryl 25mg  take 1 every 6 hours for itching and 2 at bedtime to help you stop itching and sleep better Viroptic eye drops - use as directed.  INSTRUCTIONS/FOLLOW-UP: Follow-up tomorrow for injection and in 1 week to be seen.  Thank you for choosing [facility] to provide your oncology and hematology care.  To afford each patient quality time with our providers, please arrive at least 15 minutes before your scheduled appointment time.  With your help, our goal is to use those 15 minutes to complete the necessary work-up to ensure our physicians have the information they need to help with your evaluation and healthcare recommendations.    Effective January 1st, 2014, we ask that you re-schedule your appointment with our physicians should you arrive 10 or more minutes late for your appointment.  We strive to give you quality time with our providers, and arriving late affects you and other patients whose appointments are after yours.    Again, thank you for choosing [facility].  Our hope is that these requests will decrease the amount of time that you wait before being seen by our physicians.       _____________________________________________________________  Should you have questions after your visit to [facility], please contact our office at [phone] between the hours of 8:30 a.m. and 4:30 p.m.  Voicemails left after 4:30 p.m. will not be returned  until the following business day.  For prescription refill requests, have your pharmacy contact our office with your prescription refill request.    _______________________________________________________________  We hope that we have given you very good care.  You may receive a patient satisfaction survey in the mail, please complete it and return it as soon as possible.  We value your feedback!  _______________________________________________________________  Have you asked about our STAR program?  STAR stands for Survivorship Training and Rehabilitation, and this is a nationally recognized cancer care program that focuses on survivorship and rehabilitation.  Cancer and cancer treatments may cause problems, such as, pain, making you feel tired and keeping you from doing the things that you need or want to do. Cancer rehabilitation can help. Our goal is to reduce these troubling effects and help you have the best quality of life possible.  You may receive a survey from a nurse that asks questions about your current state of health.  Based on the survey results, all eligible patients will be referred to the [facility] program for an evaluation so we can better serve you!  A frequently asked questions sheet is available upon request.

## 2014-01-11 NOTE — Progress Notes (Signed)
Randall Reyes presented for labwork. Labs per MD order drawn via Peripheral Line 23 gauge needle inserted in Right AC  Good blood return present. Procedure without incident.  Needle removed intact. Patient tolerated procedure well.

## 2014-01-12 ENCOUNTER — Ambulatory Visit (HOSPITAL_COMMUNITY): Payer: Medicare Other

## 2014-01-12 ENCOUNTER — Telehealth (HOSPITAL_COMMUNITY): Payer: Self-pay | Admitting: Hematology and Oncology

## 2014-01-13 ENCOUNTER — Other Ambulatory Visit (HOSPITAL_COMMUNITY): Payer: Self-pay | Admitting: Hematology and Oncology

## 2014-01-13 ENCOUNTER — Encounter (HOSPITAL_COMMUNITY): Payer: Medicare Other | Attending: Oncology

## 2014-01-13 ENCOUNTER — Telehealth (HOSPITAL_COMMUNITY): Payer: Self-pay

## 2014-01-13 DIAGNOSIS — C9110 Chronic lymphocytic leukemia of B-cell type not having achieved remission: Secondary | ICD-10-CM | POA: Insufficient documentation

## 2014-01-13 DIAGNOSIS — B029 Zoster without complications: Secondary | ICD-10-CM | POA: Insufficient documentation

## 2014-01-13 DIAGNOSIS — B0233 Zoster keratitis: Secondary | ICD-10-CM

## 2014-01-13 DIAGNOSIS — B356 Tinea cruris: Secondary | ICD-10-CM | POA: Insufficient documentation

## 2014-01-13 DIAGNOSIS — B0229 Other postherpetic nervous system involvement: Secondary | ICD-10-CM | POA: Insufficient documentation

## 2014-01-13 MED ORDER — VARICELLA-ZOSTER IMMUNE GLOB 125 UNIT/1.2ML IM SOLN
625.0000 [IU] | Freq: Once | INTRAMUSCULAR | Status: AC
  Administered 2014-01-13: 625 [IU] via INTRAMUSCULAR
  Filled 2014-01-13: qty 6

## 2014-01-13 MED FILL — Medication: Qty: 1 | Status: AC

## 2014-01-13 NOTE — Progress Notes (Signed)
Randall Reyes's reason for visit today is for an injection .  Randall Reyes also received varicella Zoster Immune Globin per MD orders; see MAR for administration details.  Randall Reyes tolerated all procedures well and without incident; questions were answered and patient was discharged.

## 2014-01-14 ENCOUNTER — Ambulatory Visit (HOSPITAL_COMMUNITY): Payer: Medicare Other

## 2014-01-17 NOTE — Progress Notes (Signed)
Randall Hampshire, MD [place] [address]  CLL (chronic lymphocytic leukemia)  Disseminated herpes zoster  Keratoconjunctivitis of both eyes  Tinea of groin  CURRENT THERAPY: Varicella Zoster Immune Globulin 625 mg IM on 01/13/2014.  Acyclovir 5 times daily along with Diflucan 200 mg daily. ZYDELIG has been held when herpes zoster was diagnosed on 12/28/2013. Trial of Famvir resulted in only a partial response so acyclovir was started on 01/03/2014 along with Diflucan 200 mg daily because of additional lesions on his lower extremities, groin, and back suggestive of Tinea.   INTERVAL HISTORY: Randall Reyes 78 y.o. male returns for  regular  visit for followup of chronic lymphocytic leukemia having developed concurrent herpes zoster and Tinea which treatment utilizing acyclovir plus Diflucan started two weeks ago after failure of Famvir, requiring Varicella Zoster Immune Globulin 625 mg IM on 01/13/2014.     CLL (chronic lymphocytic leukemia)   02/14/2005 Procedure Peripheral flow cytometry- CLL (without 11q or 17 p deletion) presenting with stage 0 disease   10/25/2008 Progression Increasing WBC and symptoms suggestive of progressive disease   11/09/2008 - 01/07/2009 Chemotherapy Rituxan, fludarabine, cytoxan x 3 cycles with the discontinuation of fludarabine and cytoxan due to severe pancytopenia requiring hospitalization.   01/10/2009 Adverse Reaction Severe pancytopenia and adverse side effects   02/01/2009 - 04/01/2009 Chemotherapy Rituxan every 21 days x 3 cycles   04/02/2009 Remission Stable disease.  Chemotherapy holiday started   06/22/2013 - 06/29/2013 Chemotherapy Imbruvica daily   06/29/2013 Adverse Reaction Large right posterior leg (popliteal) ecchymosis/violaceous rash.  Likely Imbruvica-induced   07/31/2013 - 10/12/2013 Chemotherapy Zydelig (idelalisib) 150 mg BID monotherapy   10/12/2013 Adverse Reaction Transaminitis, suspected to be secondary to [place].  Dose  decreased.   10/12/2013 - 11/09/2013 Chemotherapy Zydelig (idelalisib) 150 mg daily.   11/09/2013 Adverse Reaction Worsening transaminitis.  Zydelig on HOLD   11/09/2013 Treatment Plan Change Hold Zydelig   12/28/2013 Adverse Reaction Herpes Zoster outbreak.  Partial response with Famvir x 1 week, switched to acyclovir.  Progression over 1-2 weeks with development of keratoconjunctivitis bilaterally.  VZ Immune Globulin 625 mg IM given on 01/13/14.   The patient is seen for followup of his disseminated varicella-zoster outbreak. The patient reports that he he believes his eyes are a little that better. He continues to have discharge from his conjunctiva in the mornings. He denies any new outbreaks. He reports that his initial zoster outbreak on his back is improved, but continues to burn some. As far as pruritus, he reports that is improved.  The patient is seen and examined by myself in addition to Dr. Barnet Glasgow.   I provided Randall Reyes some education regarding the potential cause of this outbreak which is likely multifactorial from his CLL causing immunocompromise state. Therefore, as his outbreak improves, we will reinitiate therapy for his CLL to help combat the disseminated rash. Given this complication, we will refer back to [facility] which she was given before. Now that this medication has been used more since its approval, his possible rash in the past was unlikely Imbruvica-induced. Imbruvica can alter the integrity of capillaries causing subcutaneous bleeding. His rash previously certainly appear to be an active [place] and this is likely the cause of this complication when he was initially started on Imbruvica.    Past Medical History  Diagnosis Date  . Diabetes mellitus   . Leukemia 6.22.2012    PER PATIENT  . Hypertension   . Ulcer   . Anemia   .  CLL (chronic lymphocytic leukemia) 01/24/2011  . Melanoma in situ 01/24/2011  . DM (diabetes mellitus) 01/24/2011  . Mild obesity 01/24/2011  . Eczema  01/24/2011  . Port catheter in place 05/02/2012  . Shingles     has CLL (chronic lymphocytic leukemia); Melanoma in situ; DM (diabetes mellitus); Mild obesity; Eczema; and Port catheter in place on his problem list.     is allergic to aspirin and ibuprofen.  Randall Reyes does not currently have medications on file.  Past Surgical History  Procedure Laterality Date  . Hernia repair  2001  . Bleeding ulcer    . Portacath placement  2008  . Cataract extraction w/phaco  05/05/2012    Procedure: CATARACT EXTRACTION PHACO AND INTRAOCULAR LENS PLACEMENT (IOC);  Surgeon: Tonny Branch, MD;  Location: [facility];  Service: Ophthalmology;  Laterality: Right;  CDE:13.25  . Cataract extraction w/phaco  05/15/2012    Procedure: CATARACT EXTRACTION PHACO AND INTRAOCULAR LENS PLACEMENT (IOC);  Surgeon: Tonny Branch, MD;  Location: [facility];  Service: Ophthalmology;  Laterality: Left;  CDE:  12.32    Denies any headaches, dizziness, double vision, fevers, chills, night sweats, nausea, vomiting, diarrhea, constipation, chest pain, heart palpitations, shortness of breath, blood in stool, black tarry stool, urinary pain, urinary burning, urinary frequency, hematuria.   PHYSICAL EXAMINATION  ECOG PERFORMANCE STATUS: 1 - Symptomatic but completely ambulatory  Filed Vitals:   01/19/14 1203  BP: 98/64  Pulse: 100  Temp: 98 F (36.7 C)  Resp: 18    GENERAL:alert, no distress, comfortable, cooperative and diffuse rash SKIN: positive for: Diffuse rash HEAD: Normocephalic EYES: Injected sclera with drainage and erythema surrounding orbits bilaterally with some crusting of skin EARS: External ears normal OROPHARYNX:mucous membranes are moist  NECK: supple, trachea midline LYMPH:  not examined BREAST: Diffuse demarcated rash on chest LUNGS: not examined HEART: not examined ABDOMEN: Rash noted BACK: Primary zoster outbreak noted on back that is slightly improved with satellite lesions throughout  body EXTREMITIES:no joint deformities, effusion, or inflammation  NEURO: alert & oriented x 3 with fluent speech, no focal motor/sensory deficits, gait normal   LABORATORY DATA: CBC    Component Value Date/Time   WBC 80.2* 01/11/2014 1330   RBC 3.62* 01/11/2014 1330   HGB 13.1 01/11/2014 1330   HCT 39.8 01/11/2014 1330   PLT 173 01/11/2014 1330   MCV 109.9* 01/11/2014 1330   MCH 36.2* 01/11/2014 1330   MCHC 32.9 01/11/2014 1330   RDW 17.0* 01/11/2014 1330   LYMPHSABS 65.0* 01/11/2014 1330   MONOABS 3.2* 01/11/2014 1330   EOSABS 1.6* 01/11/2014 1330   BASOSABS 0.0 01/11/2014 1330     ASSESSMENT:  1. Disseminated herpes zoster with keratoconjunctivitis, on viroptic soln 1 gtt in each eye every 2 hours while awake, acyclovir 5 times per day, and VZ immune globulin 625 IM on 01/13/2014 2. Tinea corporis, improved.  3. Chronic leukocytic leukemia, currently off treatment.  Patient Active Problem List   Diagnosis Date Noted  . Port catheter in place 05/02/2012  . CLL (chronic lymphocytic leukemia) 01/24/2011  . Melanoma in situ 01/24/2011  . DM (diabetes mellitus) 01/24/2011  . Mild obesity 01/24/2011  . Eczema 01/24/2011     PLAN:  1. Continue Acyclovir as prescribed 2. Continue Viroptic as prescribed. 3. Patient education regarding CLL and Zoster and the potential cause of the outbreak. 4. I personally reviewed and went over laboratory results with the patient.  The results are noted within this dictation. 5. Will restart Imbruvica in  the near future.   THERAPY PLAN:  Once the Zoster outbreak shows signs of improvement, we will restart Imbruvica.  All questions were answered. The patient knows to call the clinic with any problems, questions or concerns. We can certainly see the patient much sooner if necessary.  Patient and plan discussed with Dr. Farrel Gobble and he is in agreement with the aforementioned.   Randall Reyes 01/19/2014

## 2014-01-19 ENCOUNTER — Encounter (HOSPITAL_BASED_OUTPATIENT_CLINIC_OR_DEPARTMENT_OTHER): Payer: Medicare Other | Admitting: Oncology

## 2014-01-19 ENCOUNTER — Encounter (HOSPITAL_COMMUNITY): Payer: Self-pay | Admitting: Oncology

## 2014-01-19 VITALS — BP 98/64 | HR 100 | Temp 98.0°F | Resp 18 | Wt 133.0 lb

## 2014-01-19 DIAGNOSIS — C9110 Chronic lymphocytic leukemia of B-cell type not having achieved remission: Secondary | ICD-10-CM

## 2014-01-19 DIAGNOSIS — B028 Zoster with other complications: Secondary | ICD-10-CM

## 2014-01-19 DIAGNOSIS — H16203 Unspecified keratoconjunctivitis, bilateral: Secondary | ICD-10-CM

## 2014-01-19 DIAGNOSIS — B027 Disseminated zoster: Secondary | ICD-10-CM

## 2014-01-19 DIAGNOSIS — H16209 Unspecified keratoconjunctivitis, unspecified eye: Secondary | ICD-10-CM

## 2014-01-19 DIAGNOSIS — B356 Tinea cruris: Secondary | ICD-10-CM

## 2014-01-19 NOTE — Patient Instructions (Signed)
[facility] Discharge Instructions  RECOMMENDATIONS MADE BY THE CONSULTANT AND ANY TEST RESULTS WILL BE SENT TO YOUR REFERRING PHYSICIAN.  EXAM FINDINGS BY THE PHYSICIAN TODAY AND SIGNS OR SYMPTOMS TO REPORT TO CLINIC OR PRIMARY PHYSICIAN: Exam and findings as discussed by Robynn Pane, PA-C.  Will not make any changes in what we are doing.  Will give you the Immune globulin when we get it in.    INSTRUCTIONS/FOLLOW-UP: Injection - Friday -call before coming to make sure we have the medication  Thank you for choosing [facility] to provide your oncology and hematology care.  To afford each patient quality time with our providers, please arrive at least 15 minutes before your scheduled appointment time.  With your help, our goal is to use those 15 minutes to complete the necessary work-up to ensure our physicians have the information they need to help with your evaluation and healthcare recommendations.    Effective January 1st, 2014, we ask that you re-schedule your appointment with our physicians should you arrive 10 or more minutes late for your appointment.  We strive to give you quality time with our providers, and arriving late affects you and other patients whose appointments are after yours.    Again, thank you for choosing [facility].  Our hope is that these requests will decrease the amount of time that you wait before being seen by our physicians.       _____________________________________________________________  Should you have questions after your visit to [facility], please contact our office at [phone] between the hours of 8:30 a.m. and 4:30 p.m.  Voicemails left after 4:30 p.m. will not be returned until the following business day.  For prescription refill requests, have your pharmacy contact our office with your prescription refill request.     _______________________________________________________________  We hope that we have given you very good care.  You may receive a patient satisfaction survey in the mail, please complete it and return it as soon as possible.  We value your feedback!  _______________________________________________________________  Have you asked about our STAR program?  STAR stands for Survivorship Training and Rehabilitation, and this is a nationally recognized cancer care program that focuses on survivorship and rehabilitation.  Cancer and cancer treatments may cause problems, such as, pain, making you feel tired and keeping you from doing the things that you need or want to do. Cancer rehabilitation can help. Our goal is to reduce these troubling effects and help you have the best quality of life possible.  You may receive a survey from a nurse that asks questions about your current state of health.  Based on the survey results, all eligible patients will be referred to the [facility] program for an evaluation so we can better serve you!  A frequently asked questions sheet is available upon request.

## 2014-01-20 ENCOUNTER — Telehealth (HOSPITAL_COMMUNITY): Payer: Self-pay

## 2014-01-20 NOTE — Telephone Encounter (Signed)
Wife notified and appointment cancelled.

## 2014-01-20 NOTE — Telephone Encounter (Signed)
Message copied by Mellissa Kohut on Wed Jan 20, 2014  2:05 PM ------      Message from: [facility]      Created: Tue Jan 19, 2014 12:51 PM       Insurance will not allow [place] to give the immunoglobulin shot again.  We need to wait.  Please let Randall Reyes and his wife know.  Cancel Appt Friday.  Return in 1 week for follow-up ------

## 2014-01-22 ENCOUNTER — Ambulatory Visit (HOSPITAL_COMMUNITY): Payer: Medicare Other

## 2014-01-25 ENCOUNTER — Encounter (HOSPITAL_BASED_OUTPATIENT_CLINIC_OR_DEPARTMENT_OTHER): Payer: Medicare Other | Admitting: Oncology

## 2014-01-25 ENCOUNTER — Encounter (HOSPITAL_COMMUNITY): Payer: Self-pay | Admitting: Oncology

## 2014-01-25 VITALS — BP 109/64 | HR 96 | Temp 98.0°F | Resp 18 | Wt 131.2 lb

## 2014-01-25 DIAGNOSIS — C9110 Chronic lymphocytic leukemia of B-cell type not having achieved remission: Secondary | ICD-10-CM

## 2014-01-25 DIAGNOSIS — B029 Zoster without complications: Secondary | ICD-10-CM

## 2014-01-25 MED ORDER — ACYCLOVIR 400 MG PO TABS: 400.0000 mg | ORAL_TABLET | Freq: Every day | ORAL | Status: DC

## 2014-01-25 MED ORDER — IBRUTINIB 140 MG PO CAPS: 420.0000 mg | ORAL_CAPSULE | Freq: Every day | ORAL | Status: DC

## 2014-01-25 NOTE — Progress Notes (Signed)
Randall Hampshire, MD [place] [address]  Herpes zoster - Plan: acyclovir (ZOVIRAX) 400 MG tablet  CLL (chronic lymphocytic leukemia) - Plan: ibrutinib (IMBRUVICA) 140 MG capsul  CURRENT THERAPY: Zydelig on hold due to disseminated Zoster outbreak  INTERVAL HISTORY: ADIEN Reyes 78 y.o. male returns for  regular  visit for followup of chronic lymphocytic leukemia having developed concurrent herpes zoster and Tinea which treatment utilizing acyclovir plus Diflucan started two weeks ago after failure of Famvir, requiring Varicella Zoster Immune Globulin 625 mg IM on 01/13/2014.     CLL (chronic lymphocytic leukemia)   02/14/2005 Procedure Peripheral flow cytometry- CLL (without 11q or 17 p deletion) presenting with stage 0 disease   10/25/2008 Progression Increasing WBC and symptoms suggestive of progressive disease   11/09/2008 - 01/07/2009 Chemotherapy Rituxan, fludarabine, cytoxan x 3 cycles with the discontinuation of fludarabine and cytoxan due to severe pancytopenia requiring hospitalization.   01/10/2009 Adverse Reaction Severe pancytopenia and adverse side effects   02/01/2009 - 04/01/2009 Chemotherapy Rituxan every 21 days x 3 cycles   04/02/2009 Remission Stable disease.  Chemotherapy holiday started   06/22/2013 - 06/29/2013 Chemotherapy Imbruvica daily   06/29/2013 Adverse Reaction Large right posterior leg (popliteal) ecchymosis/violaceous rash.  Likely Imbruvica-induced   07/31/2013 - 10/12/2013 Chemotherapy Zydelig (idelalisib) 150 mg BID monotherapy   10/12/2013 Adverse Reaction Transaminitis, suspected to be secondary to [place].  Dose decreased.   10/12/2013 - 11/09/2013 Chemotherapy Zydelig (idelalisib) 150 mg daily.   11/09/2013 Adverse Reaction Worsening transaminitis.  Zydelig on HOLD   11/09/2013 Treatment Plan Change Hold Zydelig   12/28/2013 Adverse Reaction Herpes Zoster outbreak.  Partial response with Famvir x 1 week, switched to acyclovir.  Progression  over 1-2 weeks with development of keratoconjunctivitis bilaterally.  VZ Immune Globulin 625 mg IM given on 01/13/14.   His disseminated zoster outbreak as much improved clinically. His periorbital area is less erythematous than previously noted. His initial zoster site on his back is healing nicely and significantly improved. The zoster outbreak throughout his body is healing and is less symptomatic.  He's given instructions regarding future treatment of his disseminated zoster in addition to future CLL treatment. We'll go back to Imbruvica therapy [name] have more experience with this medication and understanding of its side effects, particularly its rash.  Hematologically, the patient denies any complaints and ROS questioning is negative.    Past Medical History  Diagnosis Date  . Diabetes mellitus   . Leukemia 6.22.2012    PER PATIENT  . Hypertension   . Ulcer   . Anemia   . CLL (chronic lymphocytic leukemia) 01/24/2011  . Melanoma in situ 01/24/2011  . DM (diabetes mellitus) 01/24/2011  . Mild obesity 01/24/2011  . Eczema 01/24/2011  . Port catheter in place 05/02/2012  . Shingles     has CLL (chronic lymphocytic leukemia); Melanoma in situ; DM (diabetes mellitus); Mild obesity; Eczema; and Port catheter in place on his problem list.     is allergic to aspirin and ibuprofen.  Mr. Mysliwiec had no medications administered during this visit.  Past Surgical History  Procedure Laterality Date  . Hernia repair  2001  . Bleeding ulcer    . Portacath placement  2008  . Cataract extraction w/phaco  05/05/2012    Procedure: CATARACT EXTRACTION PHACO AND INTRAOCULAR LENS PLACEMENT (IOC);  Surgeon: Tonny Branch, MD;  Location: [facility];  Service: Ophthalmology;  Laterality: Right;  CDE:13.25  . Cataract extraction w/phaco  05/15/2012  Procedure: CATARACT EXTRACTION PHACO AND INTRAOCULAR LENS PLACEMENT (IOC);  Surgeon: Tonny Branch, MD;  Location: [facility];  Service: Ophthalmology;  Laterality:  Left;  CDE:  12.32    Denies any headaches, dizziness, double vision, fevers, chills, night sweats, nausea, vomiting, diarrhea, constipation, chest pain, heart palpitations, shortness of breath, blood in stool, black tarry stool, urinary pain, urinary burning, urinary frequency, hematuria.   PHYSICAL EXAMINATION  ECOG PERFORMANCE STATUS: 1 - Symptomatic but completely ambulatory  Filed Vitals:   01/25/14 1400  BP: 109/64  Pulse: 96  Temp: 98 F (36.7 C)  Resp: 18    GENERAL:alert, no distress, comfortable, cooperative and smiling SKIN: Disseminated zoster rash much improved with continued erythematous satellite lesions throughout body. HEAD: Normocephalic EYES: Conjunctivitis much improved and periorbital area is less erythematous. EARS: External ears normal OROPHARYNX:lips, buccal mucosa, and tongue normal and mucous membranes are moist  NECK: supple, trachea midline LYMPH:  not examined BREAST:not examined LUNGS: not examined HEART: not examined ABDOMEN: Rash noted BACK: Rash noted with primary zoster outbreak lesion healing nicely. No signs of infection EXTREMITIES:less then 2 second capillary refill, no joint deformities, effusion, or inflammation, no skin discoloration, no clubbing, no cyanosis  NEURO: alert & oriented x 3 with fluent speech, no focal motor/sensory deficits, gait normal   LABORATORY DATA: CBC    Component Value Date/Time   WBC 80.2* 01/11/2014 1330   RBC 3.62* 01/11/2014 1330   HGB 13.1 01/11/2014 1330   HCT 39.8 01/11/2014 1330   PLT 173 01/11/2014 1330   MCV 109.9* 01/11/2014 1330   MCH 36.2* 01/11/2014 1330   MCHC 32.9 01/11/2014 1330   RDW 17.0* 01/11/2014 1330   LYMPHSABS 65.0* 01/11/2014 1330   MONOABS 3.2* 01/11/2014 1330   EOSABS 1.6* 01/11/2014 1330   BASOSABS 0.0 01/11/2014 1330      ASSESSMENT:  1. Disseminated herpes zoster with keratoconjunctivitis, on viroptic soln 1 gtt in each eye every 2 hours while awake, acyclovir 5 times per day,  and VZ immune globulin 625 IM on 01/13/2014  2. Tinea corporis, improved.  3. Chronic leukocytic leukemia, currently off treatment due to #1  Patient Active Problem List   Diagnosis Date Noted  . Port catheter in place 05/02/2012  . CLL (chronic lymphocytic leukemia) 01/24/2011  . Melanoma in situ 01/24/2011  . DM (diabetes mellitus) 01/24/2011  . Mild obesity 01/24/2011  . Eczema 01/24/2011    PLAN:  1. I personally reviewed and went over laboratory results with the patient.  The results are noted within this dictation. 2. Refill of Acyclovir 300 mg 5 times daily 3. Refill on Viroptics when allowed by insurance 4. Rx for Imbruvica 420 mg daily given to prior authorization specialist 5. D/C Zydelig 6. Return in 2 weeks for follow-up   THERAPY PLAN:  We will discontinue Zydelig and move on to Imbruvica 420 mg once his disseminated zoster is resolved. He will continue with antiviral therapy for the time being.  All questions were answered. The patient knows to call the clinic with any problems, questions or concerns. We can certainly see the patient much sooner if necessary.  Patient and plan discussed with Dr. Farrel Gobble and he is in agreement with the aforementioned.   Richmond Coldren 01/25/2014

## 2014-01-25 NOTE — Patient Instructions (Signed)
[facility] Discharge Instructions  RECOMMENDATIONS MADE BY THE CONSULTANT AND ANY TEST RESULTS WILL BE SENT TO YOUR REFERRING PHYSICIAN.  EXAM FINDINGS BY THE PHYSICIAN TODAY AND SIGNS OR SYMPTOMS TO REPORT TO CLINIC OR PRIMARY PHYSICIAN: Exam and findings as discussed by Robynn Pane, PA-C.  MEDICATIONS PRESCRIBED:  Continue Acyclovir 5 times daily  Get eye drops refilled when you can. Imbruvica prescription has been submitted.  Do not take it until we tell you to  INSTRUCTIONS/FOLLOW-UP: Follow-up in 2 weeks.  Thank you for choosing [facility] to provide your oncology and hematology care.  To afford each patient quality time with our providers, please arrive at least 15 minutes before your scheduled appointment time.  With your help, our goal is to use those 15 minutes to complete the necessary work-up to ensure our physicians have the information they need to help with your evaluation and healthcare recommendations.    Effective January 1st, 2014, we ask that you re-schedule your appointment with our physicians should you arrive 10 or more minutes late for your appointment.  We strive to give you quality time with our providers, and arriving late affects you and other patients whose appointments are after yours.    Again, thank you for choosing [facility].  Our hope is that these requests will decrease the amount of time that you wait before being seen by our physicians.       _____________________________________________________________  Should you have questions after your visit to [facility], please contact our office at [phone] between the hours of 8:30 a.m. and 4:30 p.m.  Voicemails left after 4:30 p.m. will not be returned until the following business day.  For prescription refill requests, have your pharmacy contact our office with your prescription refill request.     _______________________________________________________________  We hope that we have given you very good care.  You may receive a patient satisfaction survey in the mail, please complete it and return it as soon as possible.  We value your feedback!  _______________________________________________________________  Have you asked about our STAR program?  STAR stands for Survivorship Training and Rehabilitation, and this is a nationally recognized cancer care program that focuses on survivorship and rehabilitation.  Cancer and cancer treatments may cause problems, such as, pain, making you feel tired and keeping you from doing the things that you need or want to do. Cancer rehabilitation can help. Our goal is to reduce these troubling effects and help you have the best quality of life possible.  You may receive a survey from a nurse that asks questions about your current state of health.  Based on the survey results, all eligible patients will be referred to the [facility] program for an evaluation so we can better serve you!  A frequently asked questions sheet is available upon request.

## 2014-01-26 ENCOUNTER — Ambulatory Visit (HOSPITAL_COMMUNITY): Payer: Medicare Other | Admitting: Oncology

## 2014-02-08 ENCOUNTER — Encounter (HOSPITAL_BASED_OUTPATIENT_CLINIC_OR_DEPARTMENT_OTHER): Payer: Medicare Other

## 2014-02-08 ENCOUNTER — Encounter (HOSPITAL_COMMUNITY): Payer: Self-pay

## 2014-02-08 VITALS — BP 82/56 | HR 107 | Temp 98.7°F | Resp 18 | Wt 129.0 lb

## 2014-02-08 DIAGNOSIS — C9110 Chronic lymphocytic leukemia of B-cell type not having achieved remission: Secondary | ICD-10-CM

## 2014-02-08 DIAGNOSIS — B0233 Zoster keratitis: Secondary | ICD-10-CM

## 2014-02-08 DIAGNOSIS — B027 Disseminated zoster: Secondary | ICD-10-CM

## 2014-02-08 DIAGNOSIS — B359 Dermatophytosis, unspecified: Secondary | ICD-10-CM

## 2014-02-08 NOTE — Patient Instructions (Signed)
[facility] Discharge Instructions  RECOMMENDATIONS MADE BY THE CONSULTANT AND ANY TEST RESULTS WILL BE SENT TO YOUR REFERRING PHYSICIAN.  Decrease acyclovir 200 mg twice a day.  Continue Diflucan 200 mg daily.  Decreased Norvasc to 2. 5 mg dosage. If blood pressure begins to rise, resumed 5 mg daily of Norvasc.  Begin Imbruvica 140 mg (one pill) daily for one week, followed by 280 mg (2 pills) during week 2 at which time repeat office visit will be had with CBC.  Thank you for choosing [facility] to provide your oncology and hematology care.  To afford each patient quality time with our providers, please arrive at least 15 minutes before your scheduled appointment time.  With your help, our goal is to use those 15 minutes to complete the necessary work-up to ensure our physicians have the information they need to help with your evaluation and healthcare recommendations.    Effective January 1st, 2014, we ask that you re-schedule your appointment with our physicians should you arrive 10 or more minutes late for your appointment.  We strive to give you quality time with our providers, and arriving late affects you and other patients whose appointments are after yours.    Again, thank you for choosing [facility].  Our hope is that these requests will decrease the amount of time that you wait before being seen by our physicians.       _____________________________________________________________  Should you have questions after your visit to [facility], please contact our office at [phone] between the hours of 8:30 a.m. and 4:30 p.m.  Voicemails left after 4:30 p.m. will not be returned until the following business day.  For prescription refill requests, have your pharmacy contact our office with your prescription refill request.    _______________________________________________________________  We hope that we have given you  very good care.  You may receive a patient satisfaction survey in the mail, please complete it and return it as soon as possible.  We value your feedback!  _______________________________________________________________  Have you asked about our STAR program?  STAR stands for Survivorship Training and Rehabilitation, and this is a nationally recognized cancer care program that focuses on survivorship and rehabilitation.  Cancer and cancer treatments may cause problems, such as, pain, making you feel tired and keeping you from doing the things that you need or want to do. Cancer rehabilitation can help. Our goal is to reduce these troubling effects and help you have the best quality of life possible.  You may receive a survey from a nurse that asks questions about your current state of health.  Based on the survey results, all eligible patients will be referred to the [facility] program for an evaluation so we can better serve you!  A frequently asked questions sheet is available upon request.

## 2014-02-08 NOTE — Progress Notes (Signed)
Randall Reyes presented for labwork. Labs per MD order drawn via Peripheral Line 23 gauge needle inserted in right antecubital.  Good blood return present. Procedure without incident.  Needle removed intact. Patient tolerated procedure well.

## 2014-02-08 NOTE — Progress Notes (Signed)
[place]  OFFICE PROGRESS NOTE  [name], MD [place] [address]  DIAGNOSIS: CLL (chronic lymphocytic leukemia) - Plan: CBC with Differential, CBC with Differential  Disseminated herpes zoster  Chief Complaint  Patient presents with  . Chronic lymphocytic leukemia  . Disseminated herpes zoster    CURRENT THERAPY: Acyclovir 5 times a day, Diflucan 200 mg daily, IM zoster immune globulin 625 mg x2, ZYDELIG on hold previously treated with ibrutinib with development of left lower extremity posterior ecchymosis at first attributed to allergy but henceforth probably a capillary leak syndrome due to ibrutinib.  INTERVAL HISTORY: Randall Reyes 78 y.o. male returns for followup of chronic lymphocytic leukemia having developed concurrent herpes zoster and Tinea which treatment utilizing acyclovir plus Diflucan started two weeks ago after failure of Famvir, requiring Varicella Zoster Immune Globulin 625 mg IM on 01/13/2014.  Eyes have cleared up completely. Ophthalmologic evaluation was negative. There was no evidence of corneal damage. Appetite is good with no nausea or vomiting. He denies any dizziness, diarrhea, lower extremity swelling or redness, or recrudescence of skin rashes.    MEDICAL HISTORY: Past Medical History  Diagnosis Date  . Diabetes mellitus   . Leukemia 6.22.2012    PER PATIENT  . Hypertension   . Ulcer   . Anemia   . CLL (chronic lymphocytic leukemia) 01/24/2011  . Melanoma in situ 01/24/2011  . DM (diabetes mellitus) 01/24/2011  . Mild obesity 01/24/2011  . Eczema 01/24/2011  . Port catheter in place 05/02/2012  . Shingles     INTERIM HISTORY: has CLL (chronic lymphocytic leukemia); Melanoma in situ; DM (diabetes mellitus); Mild obesity; Eczema; and Port catheter in place on his problem list.     CLL (chronic lymphocytic leukemia)   02/14/2005 Procedure Peripheral flow cytometry- CLL  (without 11q or 17 p deletion) presenting with stage 0 disease   10/25/2008 Progression Increasing WBC and symptoms suggestive of progressive disease   11/09/2008 - 01/07/2009 Chemotherapy Rituxan, fludarabine, cytoxan x 3 cycles with the discontinuation of fludarabine and cytoxan due to severe pancytopenia requiring hospitalization.   01/10/2009 Adverse Reaction Severe pancytopenia and adverse side effects   02/01/2009 - 04/01/2009 Chemotherapy Rituxan every 21 days x 3 cycles   04/02/2009 Remission Stable disease.  Chemotherapy holiday started   06/22/2013 - 06/29/2013 Chemotherapy Imbruvica daily   06/29/2013 Adverse Reaction Large right posterior leg (popliteal) ecchymosis/violaceous rash.  Likely Imbruvica-induced   07/31/2013 - 10/12/2013 Chemotherapy Zydelig (idelalisib) 150 mg BID monotherapy   10/12/2013 Adverse Reaction Transaminitis, suspected to be secondary to [place].  Dose decreased.   10/12/2013 - 11/09/2013 Chemotherapy Zydelig (idelalisib) 150 mg daily.   11/09/2013 Adverse Reaction Worsening transaminitis.  Zydelig on HOLD   11/09/2013 Treatment Plan Change Hold Zydelig   12/28/2013 Adverse Reaction Herpes Zoster outbreak.  Partial response with Famvir x 1 week, switched to acyclovir.  Progression over 1-2 weeks with development of keratoconjunctivitis bilaterally.  VZ Immune Globulin 625 mg IM given on 01/13/14.    ALLERGIES:  is allergic to aspirin and ibuprofen.  MEDICATIONS: has a current medication list which includes the following prescription(s): acetaminophen, acyclovir, docusate calcium, fluconazole, hydrocodone-acetaminophen, ibrutinib, lisinopril, metformin, rosuvastatin, and trifluridine.  SURGICAL HISTORY:  Past Surgical History  Procedure Laterality Date  . Hernia repair  2001  . Bleeding ulcer    . Portacath placement  2008  . Cataract extraction w/phaco  05/05/2012    Procedure: CATARACT EXTRACTION PHACO  AND INTRAOCULAR LENS PLACEMENT (IOC);  Surgeon: Tonny Branch, MD;   Location: [facility];  Service: Ophthalmology;  Laterality: Right;  CDE:13.25  . Cataract extraction w/phaco  05/15/2012    Procedure: CATARACT EXTRACTION PHACO AND INTRAOCULAR LENS PLACEMENT (IOC);  Surgeon: Tonny Branch, MD;  Location: [facility];  Service: Ophthalmology;  Laterality: Left;  CDE:  12.32    FAMILY HISTORY: family history includes Diabetes in his father.  SOCIAL HISTORY:  reports that he has quit smoking. He quit smokeless tobacco use about 46 years ago. He reports that he does not drink alcohol or use illicit drugs.  REVIEW OF SYSTEMS:  Other than that discussed above is noncontributory.  PHYSICAL EXAMINATION: ECOG PERFORMANCE STATUS: 1 - Symptomatic but completely ambulatory  Blood pressure 82/56, pulse 107, temperature 98.7 F (37.1 C), temperature source Oral, resp. rate 18, weight 129 lb (58.514 kg), SpO2 94.00%.  GENERAL:alert, no distress and comfortable SKIN: skin color, texture, turgor are normal, no rashes or significant lesions previously noted zoster lesions have healed with: Since of lower back lesions into a hyperpigmented area measuring approximately 25 x 10 cm. EYES: PERLA; Conjunctiva are pink and non-injected, sclera clear. No evidence of exudate or conjunctival injection. SINUSES: No redness or tenderness over maxillary or ethmoid sinuses OROPHARYNX:no exudate, no erythema on lips, buccal mucosa, or tongue. NECK: supple, thyroid normal size, non-tender, without nodularity. No masses CHEST: Normal AP diameter with light port in place. LYMPH:  no palpable lymphadenopathy in the cervical, axillary or inguinal LUNGS: clear to auscultation and percussion with normal breathing effort HEART: regular rate & rhythm and no murmurs. ABDOMEN:abdomen soft, non-tender and normal bowel sounds. Liver and spleen not enlarged. MUSCULOSKELETAL:no cyanosis of digits and no clubbing. Range of motion normal.  NEURO: alert & oriented x 3 with fluent speech, no focal motor/sensory  deficits   LABORATORY DATA: Office Visit on 01/11/2014  Component Date Value Ref Range Status  . WBC 01/11/2014 80.2* 4.0 - 10.5 K/uL Final   PAGED T.KEFALAS 1740 NO REPLY,LEFT S.BURGESS MESSAGE 1750 ON [id number] BY THOMPSONS.  [name] RBC 01/11/2014 3.62* 4.22 - 5.81 MIL/uL Final  . Hemoglobin 01/11/2014 13.1  13.0 - 17.0 g/dL Final  . HCT 01/11/2014 39.8  39.0 - 52.0 % Final  . MCV 01/11/2014 109.9* 78.0 - 100.0 fL Final  . MCH 01/11/2014 36.2* 26.0 - 34.0 pg Final  . MCHC 01/11/2014 32.9  30.0 - 36.0 g/dL Final  . RDW 01/11/2014 17.0* 11.5 - 15.5 % Final  . Platelets 01/11/2014 173  150 - 400 K/uL Final  . Neutrophils Relative % 01/11/2014 13* 43 - 77 % Final  . Lymphocytes Relative 01/11/2014 81* 12 - 46 % Final  . Monocytes Relative 01/11/2014 4  3 - 12 % Final  . Eosinophils Relative 01/11/2014 2  0 - 5 % Final  . Basophils Relative 01/11/2014 0  0 - 1 % Final  . Band Neutrophils 01/11/2014 0  0 - 10 % Final  . Metamyelocytes Relative 01/11/2014 0   Final  . Myelocytes 01/11/2014 0   Final  . Promyelocytes Absolute 01/11/2014 0   Final  . Blasts 01/11/2014 0   Final  . nRBC 01/11/2014 0  0 /100 WBC Final  . Neutro Abs 01/11/2014 10.4* 1.7 - 7.7 K/uL Final  . Lymphs Abs 01/11/2014 65.0* 0.7 - 4.0 K/uL Final  . Monocytes Absolute 01/11/2014 3.2* 0.1 - 1.0 K/uL Final  . Eosinophils Absolute 01/11/2014 1.6* 0.0 - 0.7 K/uL Final  . Basophils Absolute  01/11/2014 0.0  0.0 - 0.1 K/uL Final  . WBC Morphology 01/11/2014 WHITE COUNT CONFIRMED ON SMEAR   Final   Comment: ABSOLUTE LYMPHOCYTOSIS                          ATYPICAL LYMPHOCYTES                          BLASTS                          SMUDGE CELLS                          ABSOLUTE LYMPHOCYTOSIS, CONSISTENT WITH CLL.  SUGGEST IMMUNOPHENOTYPING IF A NEW PERSISTENT FINDING.    PATHOLOGY: No new pathology.  Urinalysis    Component Value Date/Time   COLORURINE YELLOW 11/03/2010 0705   APPEARANCEUR CLEAR 11/03/2010 0705   LABSPEC  1.020 11/03/2010 0705   PHURINE 5.5 11/03/2010 0705   GLUCOSEU NEGATIVE 11/03/2010 0705   HGBUR SMALL* 11/03/2010 0705   BILIRUBINUR NEGATIVE 11/03/2010 0705   KETONESUR TRACE* 11/03/2010 0705   PROTEINUR TRACE* 11/03/2010 0705   UROBILINOGEN 0.2 11/03/2010 0705   NITRITE NEGATIVE 11/03/2010 0705   LEUKOCYTESUR NEGATIVE 11/03/2010 0705    RADIOGRAPHIC STUDIES: No results found.  ASSESSMENT:   1. Disseminated herpes zoster with keratoconjunctivitis, on viroptic soln 1 gtt in each eye every 2 hours while awake, acyclovir 5 times per day, and VZ immune globulin 625 IM on 01/13/2014, resolved. 2. Tinea corporis, improved.  3. Chronic leukocytic leukemia, currently off treatment due to #1     PLAN:  #1. Decrease acyclovir 200 mg twice a day. #2. Continue Diflucan 200 mg daily. #3. Decreased Norvasc to 2. 5 mg dosage. If blood pressure begins to rise, resumed 5 mg daily of Norvasc. #4. Begin ibrutinib 140 mg daily for one week followed by 280 mg during week 2 at which time repeat office visit will be had with CBC.   All questions were answered. The patient knows to call the clinic with any problems, questions or concerns. We can certainly see the patient much sooner if necessary.   I spent 25 minutes counseling the patient face to face. The total time spent in the appointment was 30 minutes.    Doroteo Bradford, MD 02/08/2014 3:48 PM  DISCLAIMER:  This note was dictated with voice recognition software.  Similar sounding words can inadvertently be transcribed inaccurately and may not be corrected upon review.   06 I will

## 2014-02-09 ENCOUNTER — Encounter (HOSPITAL_BASED_OUTPATIENT_CLINIC_OR_DEPARTMENT_OTHER): Payer: Medicare Other

## 2014-02-09 DIAGNOSIS — B029 Zoster without complications: Secondary | ICD-10-CM | POA: Diagnosis present

## 2014-02-09 DIAGNOSIS — B356 Tinea cruris: Secondary | ICD-10-CM | POA: Diagnosis present

## 2014-02-09 DIAGNOSIS — B0229 Other postherpetic nervous system involvement: Secondary | ICD-10-CM | POA: Diagnosis present

## 2014-02-09 DIAGNOSIS — C9110 Chronic lymphocytic leukemia of B-cell type not having achieved remission: Secondary | ICD-10-CM | POA: Diagnosis not present

## 2014-02-09 LAB — CBC WITH DIFFERENTIAL/PLATELET
BLASTS: 0 %
Band Neutrophils: 0 % (ref 0–10)
Basophils Absolute: 0 10*3/uL (ref 0.0–0.1)
Basophils Relative: 0 % (ref 0–1)
Eosinophils Absolute: 2.2 10*3/uL — ABNORMAL HIGH (ref 0.0–0.7)
Eosinophils Relative: 2 % (ref 0–5)
HCT: 36.6 % — ABNORMAL LOW (ref 39.0–52.0)
HEMOGLOBIN: 12.5 g/dL — AB (ref 13.0–17.0)
LYMPHS ABS: 100 10*3/uL — AB (ref 0.7–4.0)
LYMPHS PCT: 93 % — AB (ref 12–46)
MCH: 38.7 pg — ABNORMAL HIGH (ref 26.0–34.0)
MCHC: 34.2 g/dL (ref 30.0–36.0)
MCV: 113.3 fL — ABNORMAL HIGH (ref 78.0–100.0)
MONOS PCT: 0 % — AB (ref 3–12)
Metamyelocytes Relative: 0 %
Monocytes Absolute: 0 10*3/uL — ABNORMAL LOW (ref 0.1–1.0)
Myelocytes: 0 %
NEUTROS ABS: 5.4 10*3/uL (ref 1.7–7.7)
NEUTROS PCT: 5 % — AB (ref 43–77)
Platelets: 147 10*3/uL — ABNORMAL LOW (ref 150–400)
Promyelocytes Absolute: 0 %
RBC: 3.23 MIL/uL — ABNORMAL LOW (ref 4.22–5.81)
RDW: 19.3 % — AB (ref 11.5–15.5)
WBC: 107.6 10*3/uL — AB (ref 4.0–10.5)
nRBC: 0 /100 WBC

## 2014-02-09 NOTE — Progress Notes (Signed)
LABS FOR [facility] 

## 2014-02-09 NOTE — Progress Notes (Signed)
CRITICAL VALUE ALERT Critical value received:  WBC 108.6 Date of notification:  02/09/14 Time of notification: 1100 Critical value read back:  Yes.   Nurse who received alert:  T.Yoseline Andersson,RN MD notified (1st page):  1100/Dr.Formanek,verbal Patient was started back on treatment yesterday.

## 2014-02-15 ENCOUNTER — Encounter (HOSPITAL_COMMUNITY): Payer: Medicare Other

## 2014-02-22 ENCOUNTER — Encounter (HOSPITAL_COMMUNITY): Payer: Medicare Other

## 2014-02-22 ENCOUNTER — Encounter (HOSPITAL_COMMUNITY): Payer: Medicare Other | Attending: Oncology

## 2014-02-22 ENCOUNTER — Ambulatory Visit (HOSPITAL_COMMUNITY): Payer: Medicare Other | Admitting: Oncology

## 2014-02-22 ENCOUNTER — Other Ambulatory Visit (HOSPITAL_COMMUNITY): Payer: Medicare Other

## 2014-02-22 ENCOUNTER — Encounter (HOSPITAL_COMMUNITY): Payer: Self-pay

## 2014-02-22 VITALS — BP 111/71 | HR 84 | Temp 98.5°F | Resp 18 | Wt 124.4 lb

## 2014-02-22 DIAGNOSIS — M310 Hypersensitivity angiitis: Secondary | ICD-10-CM | POA: Diagnosis present

## 2014-02-22 DIAGNOSIS — B027 Disseminated zoster: Secondary | ICD-10-CM

## 2014-02-22 DIAGNOSIS — C9110 Chronic lymphocytic leukemia of B-cell type not having achieved remission: Secondary | ICD-10-CM | POA: Diagnosis not present

## 2014-02-22 DIAGNOSIS — Z452 Encounter for adjustment and management of vascular access device: Secondary | ICD-10-CM

## 2014-02-22 LAB — CBC WITH DIFFERENTIAL/PLATELET
Basophils Absolute: 1 10*3/uL — ABNORMAL HIGH (ref 0.0–0.1)
Basophils Relative: 1 % (ref 0–1)
EOS ABS: 0.4 10*3/uL (ref 0.0–0.7)
Eosinophils Relative: 0 % (ref 0–5)
HEMATOCRIT: 39.5 % (ref 39.0–52.0)
HEMOGLOBIN: 12.3 g/dL — AB (ref 13.0–17.0)
Lymphocytes Relative: 95 % — ABNORMAL HIGH (ref 12–46)
Lymphs Abs: 185.8 10*3/uL — ABNORMAL HIGH (ref 0.7–4.0)
MCH: 36.8 pg — AB (ref 26.0–34.0)
MCHC: 31.1 g/dL (ref 30.0–36.0)
MCV: 118.3 fL — AB (ref 78.0–100.0)
MONOS PCT: 2 % — AB (ref 3–12)
Monocytes Absolute: 3.7 10*3/uL — ABNORMAL HIGH (ref 0.1–1.0)
NEUTROS ABS: 4.1 10*3/uL (ref 1.7–7.7)
Neutrophils Relative %: 2 % — ABNORMAL LOW (ref 43–77)
Platelets: 210 10*3/uL (ref 150–400)
RBC: 3.34 MIL/uL — ABNORMAL LOW (ref 4.22–5.81)
RDW: 19.7 % — ABNORMAL HIGH (ref 11.5–15.5)
WBC: 195 10*3/uL (ref 4.0–10.5)

## 2014-02-22 LAB — COMPREHENSIVE METABOLIC PANEL
ALK PHOS: 121 U/L — AB (ref 39–117)
ALT: 29 U/L (ref 0–53)
ANION GAP: 11 (ref 5–15)
AST: 30 U/L (ref 0–37)
Albumin: 3.3 g/dL — ABNORMAL LOW (ref 3.5–5.2)
BUN: 24 mg/dL — AB (ref 6–23)
CO2: 26 mEq/L (ref 19–32)
Calcium: 8.9 mg/dL (ref 8.4–10.5)
Chloride: 99 mEq/L (ref 96–112)
Creatinine, Ser: 1.8 mg/dL — ABNORMAL HIGH (ref 0.50–1.35)
GFR calc non Af Amer: 35 mL/min — ABNORMAL LOW (ref >90)
GFR, EST AFRICAN AMERICAN: 40 mL/min — AB (ref >90)
Glucose, Bld: 103 mg/dL — ABNORMAL HIGH (ref 70–99)
POTASSIUM: 5.2 meq/L (ref 3.7–5.3)
Sodium: 136 mEq/L — ABNORMAL LOW (ref 137–147)
Total Bilirubin: 0.4 mg/dL (ref 0.3–1.2)
Total Protein: 8.1 g/dL (ref 6.0–8.3)

## 2014-02-22 LAB — LACTATE DEHYDROGENASE: LDH: 215 U/L (ref 94–250)

## 2014-02-22 MED ORDER — SODIUM CHLORIDE 0.9 % IJ SOLN
10.0000 mL | INTRAMUSCULAR | Status: DC | PRN
  Administered 2014-02-22: 10 mL via INTRAVENOUS

## 2014-02-22 MED ORDER — HEPARIN SOD (PORK) LOCK FLUSH 100 UNIT/ML IV SOLN
500.0000 [IU] | Freq: Once | INTRAVENOUS | Status: AC
  Administered 2014-02-22: 500 [IU] via INTRAVENOUS

## 2014-02-22 MED ORDER — HEPARIN SOD (PORK) LOCK FLUSH 100 UNIT/ML IV SOLN
INTRAVENOUS | Status: AC
  Filled 2014-02-22: qty 5

## 2014-02-22 NOTE — Progress Notes (Signed)
Randall Reyes presented for Portacath access and flush.  Proper placement of portacath confirmed by CXR.  Portacath located right chest wall accessed with  H 20 needle.  No blood return and flushed well Portacath flushed with 29ml NS and 500U/64ml Heparin and needle removed intact.  Procedure tolerated well and without incident.

## 2014-02-22 NOTE — Progress Notes (Signed)
CRITICAL VALUE ALERT  Critical value received:  WBC 195  Date of notification:  02/22/14  Time of notification:  0881  Critical value read back:Yes.    Nurse who received alert:  A. Ouida Sills, RN  Robynn Pane, PA-C notified of critical result on 02/22/14 at 1400.

## 2014-02-22 NOTE — Progress Notes (Signed)
[facility]  OFFICE PROGRESS NOTE  [name], MD [place] [address]  DIAGNOSIS: CLL (chronic lymphocytic leukemia) - Plan: CBC with Differential, Comprehensive metabolic panel, Lactate dehydrogenase, Beta 2 microglobuline, serum, CBC with Differential, Comprehensive metabolic panel, Lactate dehydrogenase, Beta 2 microglobuline, serum, Schedule Portacath Flush Appointment, heparin lock flush 100 unit/mL, sodium chloride 0.9 % injection 10 mL, CBC with Differential  Disseminated herpes zoster  Chief Complaint  Patient presents with  . Chronic lymphocytic leukemia    Ibrutinib currently 280 mg daily    CURRENT THERAPY: Acyclovir 200 mg twice a day, Diflucan 200 mg daily, Norvasc 2.5 mg daily, ibrutinib restarted on 02/08/2014 taking 140 mg daily for one week followed by 280 mg daily followed by 420 mg daily.   INTERVAL HISTORY: Randall Reyes 78 y.o. male returns for followup after restarting ibrutinib for chronic lymphocytic leukemia, with gradually increasing doses begun on 02/08/2014 ultimately to reach 420 mg daily.  2 days ago the patient developed a rash on his face with burning of his tongue and an aphthous ulcer on the tongue. He denies any diarrhea and has had a scaly-like skin eruption on both shins. He denies any subcutaneous hemorrhage, conjunctival inflammation or redness. He denies any fever, night sweats, and is currently not taking any Norvasc at all. He denies any lower extremity swelling or redness, cough, wheezing, PND, orthopnea, palpitations, or headache.  MEDICAL HISTORY: Past Medical History  Diagnosis Date  . Diabetes mellitus   . Leukemia 6.22.2012    PER PATIENT  . Hypertension   . Ulcer   . Anemia   . CLL (chronic lymphocytic leukemia) 01/24/2011  . Melanoma in situ 01/24/2011  . DM (diabetes mellitus) 01/24/2011  . Mild obesity 01/24/2011  . Eczema 01/24/2011  . Port catheter in place  05/02/2012  . Shingles     INTERIM HISTORY: has CLL (chronic lymphocytic leukemia); Melanoma in situ; DM (diabetes mellitus); Mild obesity; Eczema; and Port catheter in place on his problem list.     CLL (chronic lymphocytic leukemia)   02/14/2005 Procedure Peripheral flow cytometry- CLL (without 11q or 17 p deletion) presenting with stage 0 disease   10/25/2008 Progression Increasing WBC and symptoms suggestive of progressive disease   11/09/2008 - 01/07/2009 Chemotherapy Rituxan, fludarabine, cytoxan x 3 cycles with the discontinuation of fludarabine and cytoxan due to severe pancytopenia requiring hospitalization.   01/10/2009 Adverse Reaction Severe pancytopenia and adverse side effects   02/01/2009 - 04/01/2009 Chemotherapy Rituxan every 21 days x 3 cycles   04/02/2009 Remission Stable disease.  Chemotherapy holiday started   06/22/2013 - 06/29/2013 Chemotherapy Imbruvica daily   06/29/2013 Adverse Reaction Large right posterior leg (popliteal) ecchymosis/violaceous rash.  Likely Imbruvica-induced   07/31/2013 - 10/12/2013 Chemotherapy Zydelig (idelalisib) 150 mg BID monotherapy   10/12/2013 Adverse Reaction Transaminitis, suspected to be secondary to [place].  Dose decreased.   10/12/2013 - 11/09/2013 Chemotherapy Zydelig (idelalisib) 150 mg daily.   11/09/2013 Adverse Reaction Worsening transaminitis.  Zydelig on HOLD   11/09/2013 Treatment Plan Change Hold Zydelig   12/28/2013 Adverse Reaction Herpes Zoster outbreak.  Partial response with Famvir x 1 week, switched to acyclovir.  Progression over 1-2 weeks with development of keratoconjunctivitis bilaterally.  VZ Immune Globulin 625 mg IM given on 01/13/14.   02/08/2014 Treatment Plan Change Ibrutinib restarted 140 mg daily for one week followed by 280 mg daily followed by 420 mg daily with prophylactic acyclovir.  ALLERGIES:  is allergic to aspirin and ibuprofen.  MEDICATIONS: has a current medication list which includes the following prescription(s):  acetaminophen, acyclovir, docusate calcium, hydrocodone-acetaminophen, ibrutinib, metformin, rosuvastatin, fluconazole, lisinopril, and trifluridine, and the following Facility-Administered Medications: sodium chloride.  SURGICAL HISTORY:  Past Surgical History  Procedure Laterality Date  . Hernia repair  2001  . Bleeding ulcer    . Portacath placement  2008  . Cataract extraction w/phaco  05/05/2012    Procedure: CATARACT EXTRACTION PHACO AND INTRAOCULAR LENS PLACEMENT (IOC);  Surgeon: Tonny Branch, MD;  Location: [facility];  Service: Ophthalmology;  Laterality: Right;  CDE:13.25  . Cataract extraction w/phaco  05/15/2012    Procedure: CATARACT EXTRACTION PHACO AND INTRAOCULAR LENS PLACEMENT (IOC);  Surgeon: Tonny Branch, MD;  Location: [facility];  Service: Ophthalmology;  Laterality: Left;  CDE:  12.32    FAMILY HISTORY: family history includes Diabetes in his father.  SOCIAL HISTORY:  reports that he has quit smoking. He quit smokeless tobacco use about 46 years ago. He reports that he does not drink alcohol or use illicit drugs.  REVIEW OF SYSTEMS:  Other than that discussed above is noncontributory.  PHYSICAL EXAMINATION: ECOG PERFORMANCE STATUS: 1 - Symptomatic but completely ambulatory  Blood pressure 111/71, pulse 84, temperature 98.5 F (36.9 C), temperature source Oral, resp. rate 18, weight 124 lb 6.4 oz (56.427 kg), SpO2 94.00%.  GENERAL:alert, no distress and comfortable SKIN: Areas of erythema noted on the face with confluence on both lower extremities in the shin region.  EYES: PERLA; Conjunctiva are pink and non-injected, sclera clear SINUSES: No redness or tenderness over maxillary or ethmoid sinuses OROPHARYNX:no exudate, no erythema on lips, buccal mucosa, or tongue. NECK: supple, thyroid normal size, non-tender, without nodularity. No masses CHEST: LifePort in place. No gynecomastia. LYMPH:  no palpable lymphadenopathy in the cervical, axillary or inguinal LUNGS: clear to  auscultation and percussion with normal breathing effort HEART: regular rate & rhythm and no murmurs. ABDOMEN:abdomen soft, non-tender and normal bowel sounds MUSCULOSKELETAL:no cyanosis of digits and no clubbing. Range of motion normal.  NEURO: alert & oriented x 3 with fluent speech, no focal motor/sensory deficits   LABORATORY DATA: Office Visit on 02/22/2014  Component Date Value Ref Range Status  . WBC 02/22/2014 195.0* 4.0 - 10.5 K/uL Final   Comment: RESULT REPEATED AND VERIFIED                          CRITICAL RESULT CALLED TO, READ BACK BY AND VERIFIED WITH:                          ANDREWS,A ON 02/22/2014 AT 1359 BY ISLEY,B   . RBC 02/22/2014 3.34* 4.22 - 5.81 MIL/uL Final  . Hemoglobin 02/22/2014 12.3* 13.0 - 17.0 g/dL Final  . HCT 02/22/2014 39.5  39.0 - 52.0 % Final  . MCV 02/22/2014 118.3* 78.0 - 100.0 fL Final  . MCH 02/22/2014 36.8* 26.0 - 34.0 pg Final  . MCHC 02/22/2014 31.1  30.0 - 36.0 g/dL Final  . RDW 02/22/2014 19.7* 11.5 - 15.5 % Final  . Platelets 02/22/2014 210  150 - 400 K/uL Final   SPECIMEN CHECKED FOR CLOTS  . Neutrophils Relative % 02/22/2014 2* 43 - 77 % Final  . Neutro Abs 02/22/2014 4.1  1.7 - 7.7 K/uL Final  . Lymphocytes Relative 02/22/2014 95* 12 - 46 % Final  . Lymphs Abs 02/22/2014 185.8* 0.7 -  4.0 K/uL Final  . Monocytes Relative 02/22/2014 2* 3 - 12 % Final  . Monocytes Absolute 02/22/2014 3.7* 0.1 - 1.0 K/uL Final  . Eosinophils Relative 02/22/2014 0  0 - 5 % Final  . Eosinophils Absolute 02/22/2014 0.4  0.0 - 0.7 K/uL Final  . Basophils Relative 02/22/2014 1  0 - 1 % Final  . Basophils Absolute 02/22/2014 1.0* 0.0 - 0.1 K/uL Final  . WBC Morphology 02/22/2014 RESULT REPEATED AND VERIFIED   Final   Comment: ABSOLUTE LYMPHOCYTOSIS                          ATYPICAL LYMPHOCYTES                          SMUDGE CELLS  . Sodium 02/22/2014 136* 137 - 147 mEq/L Final  . Potassium 02/22/2014 5.2  3.7 - 5.3 mEq/L Final  . Chloride 02/22/2014 99   96 - 112 mEq/L Final  . CO2 02/22/2014 26  19 - 32 mEq/L Final  . Glucose, Bld 02/22/2014 103* 70 - 99 mg/dL Final  . BUN 02/22/2014 24* 6 - 23 mg/dL Final  . Creatinine, Ser 02/22/2014 1.80* 0.50 - 1.35 mg/dL Final  . Calcium 02/22/2014 8.9  8.4 - 10.5 mg/dL Final  . Total Protein 02/22/2014 8.1  6.0 - 8.3 g/dL Final  . Albumin 02/22/2014 3.3* 3.5 - 5.2 g/dL Final  . AST 02/22/2014 30  0 - 37 U/L Final  . ALT 02/22/2014 29  0 - 53 U/L Final  . Alkaline Phosphatase 02/22/2014 121* 39 - 117 U/L Final  . Total Bilirubin 02/22/2014 0.4  0.3 - 1.2 mg/dL Final  . GFR calc non Af Amer 02/22/2014 35* >90 mL/min Final  . GFR calc Af Amer 02/22/2014 40* >90 mL/min Final   Comment: (NOTE)                          The eGFR has been calculated using the CKD EPI equation.                          This calculation has not been validated in all clinical situations.                          eGFR's persistently <90 mL/min signify possible Chronic Kidney                          Disease.  . Anion gap 02/22/2014 11  5 - 15 Final  . LDH 02/22/2014 215  94 - 250 U/L Final  Lab on 02/09/2014  Component Date Value Ref Range Status  . WBC 02/09/2014 107.6* 4.0 - 10.5 K/uL Final   Comment: RESULT REPEATED AND VERIFIED                          WHITE COUNT CONFIRMED ON SMEAR                          CRITICAL RESULT CALLED TO, READ BACK BY AND VERIFIED WITH:                          [place] ON [id number] AT 1055 BY  RESSEGGER R  . RBC 02/09/2014 3.23* 4.22 - 5.81 MIL/uL Final  . Hemoglobin 02/09/2014 12.5* 13.0 - 17.0 g/dL Final  . HCT 02/09/2014 36.6* 39.0 - 52.0 % Final  . MCV 02/09/2014 113.3* 78.0 - 100.0 fL Final  . MCH 02/09/2014 38.7* 26.0 - 34.0 pg Final  . MCHC 02/09/2014 34.2  30.0 - 36.0 g/dL Final  . RDW 02/09/2014 19.3* 11.5 - 15.5 % Final  . Platelets 02/09/2014 147* 150 - 400 K/uL Final  . Neutrophils Relative % 02/09/2014 5* 43 - 77 % Final  . Lymphocytes Relative 02/09/2014 93* 12 - 46 % Final   . Monocytes Relative 02/09/2014 0* 3 - 12 % Final  . Eosinophils Relative 02/09/2014 2  0 - 5 % Final  . Basophils Relative 02/09/2014 0  0 - 1 % Final  . Band Neutrophils 02/09/2014 0  0 - 10 % Final  . Metamyelocytes Relative 02/09/2014 0   Final  . Myelocytes 02/09/2014 0   Final  . Promyelocytes Absolute 02/09/2014 0   Final  . Blasts 02/09/2014 0   Final  . nRBC 02/09/2014 0  0 /100 WBC Final  . Neutro Abs 02/09/2014 5.4  1.7 - 7.7 K/uL Final  . Lymphs Abs 02/09/2014 100.0* 0.7 - 4.0 K/uL Final  . Monocytes Absolute 02/09/2014 0.0* 0.1 - 1.0 K/uL Final  . Eosinophils Absolute 02/09/2014 2.2* 0.0 - 0.7 K/uL Final  . Basophils Absolute 02/09/2014 0.0  0.0 - 0.1 K/uL Final  . WBC Morphology 02/09/2014 ABSOLUTE LYMPHOCYTOSIS   Final   Comment: ATYPICAL LYMPHOCYTES                          SMUDGE CELLS    PATHOLOGY: Peripheral smear failed to reveal evidence of premature forms.  Urinalysis    Component Value Date/Time   COLORURINE YELLOW 11/03/2010 0705   APPEARANCEUR CLEAR 11/03/2010 0705   LABSPEC 1.020 11/03/2010 0705   PHURINE 5.5 11/03/2010 0705   GLUCOSEU NEGATIVE 11/03/2010 0705   HGBUR SMALL* 11/03/2010 0705   BILIRUBINUR NEGATIVE 11/03/2010 0705   KETONESUR TRACE* 11/03/2010 0705   PROTEINUR TRACE* 11/03/2010 0705   UROBILINOGEN 0.2 11/03/2010 0705   NITRITE NEGATIVE 11/03/2010 0705   LEUKOCYTESUR NEGATIVE 11/03/2010 0705    RADIOGRAPHIC STUDIES: No results found.  ASSESSMENT:  #1. 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 #2. Recent episode of disseminated herpes zoster including keratoconjunctivitis, resolved, currently on prophylactic acyclovir.   PLAN:  #1. Prednisone 20 mg daily. #2. Increase ibrutinib to 420 mg daily. #3. Continue acyclovir 200 mg twice a day. #4. Flush Port-A-Cath. #5. Return in one week with CBC.   All questions were answered. The patient knows to call the clinic with any problems, questions or concerns. We can  certainly see the patient much sooner if necessary.   I spent 25 minutes counseling the patient face to face. The total time spent in the appointment was 30 minutes.    Doroteo Bradford, MD 02/22/2014 2:27 PM  DISCLAIMER:  This note was dictated with voice recognition software.  Similar sounding words can inadvertently be transcribed inaccurately and may not be corrected upon review.

## 2014-02-22 NOTE — Patient Instructions (Addendum)
[facility] Discharge Instructions  RECOMMENDATIONS MADE BY THE CONSULTANT AND ANY TEST RESULTS WILL BE SENT TO YOUR REFERRING PHYSICIAN.  EXAM FINDINGS BY THE PHYSICIAN TODAY AND SIGNS OR SYMPTOMS TO REPORT TO CLINIC OR PRIMARY PHYSICIAN: You saw Dr Barnet Glasgow today  Please follow up in 1 week with lab work.  Port flushes every 6 weeks.  Increase Imbruvica to 3 capsules a day.  He is going to prescib you predisone to take.  Also you throat drops to help with mouth pain.  If this does not improve call us and we will prescib you some mouth rinse  Thank you for choosing [facility] to provide your oncology and hematology care.  To afford each patient quality time with our providers, please arrive at least 15 minutes before your scheduled appointment time.  With your help, our goal is to use those 15 minutes to complete the necessary work-up to ensure our physicians have the information they need to help with your evaluation and healthcare recommendations.    Effective January 1st, 2014, we ask that you re-schedule your appointment with our physicians should you arrive 10 or more minutes late for your appointment.  We strive to give you quality time with our providers, and arriving late affects you and other patients whose appointments are after yours.    Again, thank you for choosing [facility].  Our hope is that these requests will decrease the amount of time that you wait before being seen by our physicians.       _____________________________________________________________  Should you have questions after your visit to [facility], please contact our office at [phone] between the hours of 8:30 a.m. and 5:00 p.m.  Voicemails left after 4:30 p.m. will not be returned until the following business day.  For prescription refill requests, have your pharmacy contact our office with your prescription refill request.

## 2014-02-23 ENCOUNTER — Other Ambulatory Visit (HOSPITAL_COMMUNITY): Payer: Self-pay | Admitting: Hematology and Oncology

## 2014-02-23 MED ORDER — PREDNISONE 20 MG PO TABS: ORAL_TABLET | ORAL | Status: DC

## 2014-02-24 LAB — BETA 2 MICROGLOBULIN, SERUM: Beta-2 Microglobulin: 18 mg/L — ABNORMAL HIGH (ref <=2.51)

## 2014-03-02 ENCOUNTER — Encounter (HOSPITAL_COMMUNITY): Payer: Self-pay

## 2014-03-02 ENCOUNTER — Encounter (HOSPITAL_COMMUNITY): Payer: Medicare Other

## 2014-03-02 ENCOUNTER — Encounter (HOSPITAL_BASED_OUTPATIENT_CLINIC_OR_DEPARTMENT_OTHER): Payer: Medicare Other

## 2014-03-02 ENCOUNTER — Telehealth (HOSPITAL_COMMUNITY): Payer: Self-pay | Admitting: *Deleted

## 2014-03-02 VITALS — BP 133/68 | HR 55 | Temp 98.0°F | Resp 16 | Wt 132.5 lb

## 2014-03-02 DIAGNOSIS — C9110 Chronic lymphocytic leukemia of B-cell type not having achieved remission: Secondary | ICD-10-CM

## 2014-03-02 DIAGNOSIS — Z23 Encounter for immunization: Secondary | ICD-10-CM

## 2014-03-02 DIAGNOSIS — M310 Hypersensitivity angiitis: Secondary | ICD-10-CM

## 2014-03-02 DIAGNOSIS — B0229 Other postherpetic nervous system involvement: Secondary | ICD-10-CM

## 2014-03-02 LAB — CBC WITH DIFFERENTIAL/PLATELET
BASOS ABS: 0.6 10*3/uL — AB (ref 0.0–0.1)
Basophils Relative: 0 % (ref 0–1)
EOS ABS: 0 10*3/uL (ref 0.0–0.7)
EOS PCT: 0 % (ref 0–5)
HCT: 35.8 % — ABNORMAL LOW (ref 39.0–52.0)
Hemoglobin: 11 g/dL — ABNORMAL LOW (ref 13.0–17.0)
Lymphocytes Relative: 90 % — ABNORMAL HIGH (ref 12–46)
Lymphs Abs: 141.4 10*3/uL — ABNORMAL HIGH (ref 0.7–4.0)
MCH: 36.8 pg — AB (ref 26.0–34.0)
MCHC: 30.7 g/dL (ref 30.0–36.0)
MCV: 119.7 fL — AB (ref 78.0–100.0)
Monocytes Absolute: 2.4 10*3/uL — ABNORMAL HIGH (ref 0.1–1.0)
Monocytes Relative: 2 % — ABNORMAL LOW (ref 3–12)
Neutro Abs: 12.3 10*3/uL — ABNORMAL HIGH (ref 1.7–7.7)
Neutrophils Relative %: 8 % — ABNORMAL LOW (ref 43–77)
PLATELETS: 200 10*3/uL (ref 150–400)
RBC: 2.99 MIL/uL — ABNORMAL LOW (ref 4.22–5.81)
RDW: 19 % — ABNORMAL HIGH (ref 11.5–15.5)
WBC: 156.6 10*3/uL — AB (ref 4.0–10.5)

## 2014-03-02 MED ORDER — PNEUMOCOCCAL VAC POLYVALENT 25 MCG/0.5ML IJ INJ
0.5000 mL | INJECTION | Freq: Once | INTRAMUSCULAR | Status: AC
  Administered 2014-03-02: 0.5 mL via INTRAMUSCULAR
  Filled 2014-03-02: qty 0.5

## 2014-03-02 NOTE — Progress Notes (Signed)
[name] presents today for injection per MD orders. Pneumovax administered SIMin left deltoid. Administration without incident. Patient tolerated well.

## 2014-03-02 NOTE — Patient Instructions (Signed)
[facility] Discharge Instructions  RECOMMENDATIONS MADE BY THE CONSULTANT AND ANY TEST RESULTS WILL BE SENT TO YOUR REFERRING PHYSICIAN.  EXAM FINDINGS BY THE PHYSICIAN TODAY AND SIGNS OR SYMPTOMS TO REPORT TO CLINIC OR PRIMARY PHYSICIAN: Exam and findings as discussed by Dr. Barnet Glasgow.  Report fevers or other concerns.  Pneumonia Vaccine today.  MEDICATIONS PRESCRIBED:  Continue Imbruvica 3 pills daily, continue lisinopril 1/2 tablet daily and continue prednisone 20 mg daily.  INSTRUCTIONS/FOLLOW-UP: Follow-up in 3 weeks with labs and office visit.  Thank you for choosing [facility] to provide your oncology and hematology care.  To afford each patient quality time with our providers, please arrive at least 15 minutes before your scheduled appointment time.  With your help, our goal is to use those 15 minutes to complete the necessary work-up to ensure our physicians have the information they need to help with your evaluation and healthcare recommendations.    Effective January 1st, 2014, we ask that you re-schedule your appointment with our physicians should you arrive 10 or more minutes late for your appointment.  We strive to give you quality time with our providers, and arriving late affects you and other patients whose appointments are after yours.    Again, thank you for choosing [facility].  Our hope is that these requests will decrease the amount of time that you wait before being seen by our physicians.       _____________________________________________________________  Should you have questions after your visit to [facility], please contact our office at [phone] between the hours of 8:30 a.m. and 4:30 p.m.  Voicemails left after 4:30 p.m. will not be returned until the following business day.  For prescription refill requests, have your pharmacy contact our office with your prescription refill request.     _______________________________________________________________  We hope that we have given you very good care.  You may receive a patient satisfaction survey in the mail, please complete it and return it as soon as possible.  We value your feedback!  _______________________________________________________________  Have you asked about our STAR program?  STAR stands for Survivorship Training and Rehabilitation, and this is a nationally recognized cancer care program that focuses on survivorship and rehabilitation.  Cancer and cancer treatments may cause problems, such as, pain, making you feel tired and keeping you from doing the things that you need or want to do. Cancer rehabilitation can help. Our goal is to reduce these troubling effects and help you have the best quality of life possible.  You may receive a survey from a nurse that asks questions about your current state of health.  Based on the survey results, all eligible patients will be referred to the [facility] program for an evaluation so we can better serve you!  A frequently asked questions sheet is available upon request.

## 2014-03-02 NOTE — Telephone Encounter (Signed)
..  CRITICAL VALUE ALERT Critical value received:  WBC 156.6 Date of notification:  03/02/2014 Time of notification: 1300 Critical value read back:  Yes.   Nurse who received alert:  TAR MD notified (1st page):  1300

## 2014-03-02 NOTE — Progress Notes (Signed)
[facility]  OFFICE PROGRESS NOTE  [name], MD [place] [address]  DIAGNOSIS: CLL (chronic lymphocytic leukemia) - Plan: Pneumococcal polysaccharide vaccine 23-valent greater than or equal to 76yo subcutaneous/IM  Leukocytoclastic vasculitis - Plan: Pneumococcal polysaccharide vaccine 23-valent greater than or equal to 76yo subcutaneous/IM  Post herpetic neuralgia  Chief Complaint  Patient presents with  . Chronic lymphocytic leukemia  . Leukocytoclastic vasculitis    CURRENT THERAPY: Ibrutinib 420 mg daily, acyclovir 200 mg twice a day, prednisone 20 mg daily  INTERVAL HISTORY: Randall Reyes 78 y.o. male returns for followup of chronic lymphocytic leukemia and leukocytoclastic vasculitis with recent episode of disseminated herpes zoster, now on ibrutinib 420 mg daily. Disseminated zoster occurred while taking idelalisib. Facial rash is much better with no further burning of his tongue. Lower  extremity rash is unchanged. He denies a diarrhea, nausea, vomiting, fever, or night sweats. Appetite is excellent. He denies any cough, wheezing, sore throat, headache, or seizures.   MEDICAL HISTORY: Past Medical History  Diagnosis Date  . Diabetes mellitus   . Leukemia 6.22.2012    PER PATIENT  . Hypertension   . Ulcer   . Anemia   . CLL (chronic lymphocytic leukemia) 01/24/2011  . Melanoma in situ 01/24/2011  . DM (diabetes mellitus) 01/24/2011  . Mild obesity 01/24/2011  . Eczema 01/24/2011  . Port catheter in place 05/02/2012  . Shingles     INTERIM HISTORY: has CLL (chronic lymphocytic leukemia); Melanoma in situ; DM (diabetes mellitus); Mild obesity; Eczema; and Port catheter in place on his problem list.    ALLERGIES:  is allergic to aspirin and ibuprofen.  MEDICATIONS: has a current medication list which includes the following prescription(s): acetaminophen, acyclovir, docusate calcium, ibrutinib,  lisinopril, metformin, prednisone, rosuvastatin, fluconazole, hydrocodone-acetaminophen, and trifluridine.  SURGICAL HISTORY:  Past Surgical History  Procedure Laterality Date  . Hernia repair  2001  . Bleeding ulcer    . Portacath placement  2008  . Cataract extraction w/phaco  05/05/2012    Procedure: CATARACT EXTRACTION PHACO AND INTRAOCULAR LENS PLACEMENT (IOC);  Surgeon: Tonny Branch, MD;  Location: [facility];  Service: Ophthalmology;  Laterality: Right;  CDE:13.25  . Cataract extraction w/phaco  05/15/2012    Procedure: CATARACT EXTRACTION PHACO AND INTRAOCULAR LENS PLACEMENT (IOC);  Surgeon: Tonny Branch, MD;  Location: [facility];  Service: Ophthalmology;  Laterality: Left;  CDE:  12.32    FAMILY HISTORY: family history includes Diabetes in his father.  SOCIAL HISTORY:  reports that he has quit smoking. He quit smokeless tobacco use about 46 years ago. He reports that he does not drink alcohol or use illicit drugs.  REVIEW OF SYSTEMS:  Other than that discussed above is noncontributory.  PHYSICAL EXAMINATION: ECOG PERFORMANCE STATUS: 1 - Symptomatic but completely ambulatory  Blood pressure 133/68, pulse 55, temperature 98 F (36.7 C), temperature source Oral, resp. rate 16, weight 132 lb 8 oz (60.102 kg), SpO2 97.00%.  GENERAL:alert, no distress and comfortable SKIN: skin color, texture, turgor are normal, no rashes or significant lesions. Minimal rash on the face. Lower extremity rash unchanged on both anterior shins. EYES: PERLA; Conjunctiva are pink and non-injected, sclera clear SINUSES: No redness or tenderness over maxillary or ethmoid sinuses OROPHARYNX:no exudate, no erythema on lips, buccal mucosa, or tongue. NECK: supple, thyroid normal size, non-tender, without nodularity. No masses CHEST: Increased AP diameter with light port in place. LYMPH:  no palpable lymphadenopathy  in the cervical, axillary or inguinal LUNGS: clear to auscultation and percussion with normal breathing  effort HEART: regular rate & rhythm and no murmurs. ABDOMEN:abdomen soft, non-tender and normal bowel sounds MUSCULOSKELETAL:no cyanosis of digits and no clubbing. Range of motion normal.  NEURO: alert & oriented x 3 with fluent speech, no focal motor/sensory deficits   LABORATORY DATA:  Results for Randall, Reyes (MRN [id number]) as of 03/02/2014 13:21  Ref. Range 01/04/2014 08:44 01/11/2014 13:30 02/09/2014 10:15 02/22/2014 13:22 03/02/2014 12:30  WBC Latest Range: 4.0-10.5 K/uL 48.4 (H) 80.2 (HH) 107.6 (HH) 195.0 (HH) 156.6 (HH)     Lab on 03/02/2014  Component Date Value Ref Range Status  . WBC 03/02/2014 156.6* 4.0 - 10.5 K/uL Final   Comment: RESULT REPEATED AND VERIFIED                          WHITE COUNT CONFIRMED ON SMEAR                          CRITICAL RESULT CALLED TO, READ BACK BY AND VERIFIED WITH:                          TAMMY ROBERTSON RN ON 102015 AT 1300 BY RESSEGGER R  . RBC 03/02/2014 2.99* 4.22 - 5.81 MIL/uL Final  . Hemoglobin 03/02/2014 11.0* 13.0 - 17.0 g/dL Final  . HCT 03/02/2014 35.8* 39.0 - 52.0 % Final  . MCV 03/02/2014 119.7* 78.0 - 100.0 fL Final  . MCH 03/02/2014 36.8* 26.0 - 34.0 pg Final  . MCHC 03/02/2014 30.7  30.0 - 36.0 g/dL Final  . RDW 03/02/2014 19.0* 11.5 - 15.5 % Final  . Platelets 03/02/2014 200  150 - 400 K/uL Final  . Neutrophils Relative % 03/02/2014 8* 43 - 77 % Final  . Neutro Abs 03/02/2014 12.3* 1.7 - 7.7 K/uL Final  . Lymphocytes Relative 03/02/2014 90* 12 - 46 % Final  . Lymphs Abs 03/02/2014 141.4* 0.7 - 4.0 K/uL Final  . Monocytes Relative 03/02/2014 2* 3 - 12 % Final  . Monocytes Absolute 03/02/2014 2.4* 0.1 - 1.0 K/uL Final  . Eosinophils Relative 03/02/2014 0  0 - 5 % Final  . Eosinophils Absolute 03/02/2014 0.0  0.0 - 0.7 K/uL Final  . Basophils Relative 03/02/2014 0  0 - 1 % Final  . Basophils Absolute 03/02/2014 0.6* 0.0 - 0.1 K/uL Final  . WBC Morphology 03/02/2014 ABSOLUTE LYMPHOCYTOSIS   Final   Comment:  ATYPICAL LYMPHOCYTES                          SMUDGE CELLS  Office Visit on 02/22/2014  Component Date Value Ref Range Status  . WBC 02/22/2014 195.0* 4.0 - 10.5 K/uL Final   Comment: RESULT REPEATED AND VERIFIED                          CRITICAL RESULT CALLED TO, READ BACK BY AND VERIFIED WITH:                          ANDREWS,A ON 02/22/2014 AT 1359 BY ISLEY,B   . RBC 02/22/2014 3.34* 4.22 - 5.81 MIL/uL Final  . Hemoglobin 02/22/2014 12.3* 13.0 - 17.0 g/dL Final  . HCT 02/22/2014 39.5  39.0 - 52.0 % Final  .  MCV 02/22/2014 118.3* 78.0 - 100.0 fL Final  . MCH 02/22/2014 36.8* 26.0 - 34.0 pg Final  . MCHC 02/22/2014 31.1  30.0 - 36.0 g/dL Final  . RDW 02/22/2014 19.7* 11.5 - 15.5 % Final  . Platelets 02/22/2014 210  150 - 400 K/uL Final   SPECIMEN CHECKED FOR CLOTS  . Neutrophils Relative % 02/22/2014 2* 43 - 77 % Final  . Neutro Abs 02/22/2014 4.1  1.7 - 7.7 K/uL Final  . Lymphocytes Relative 02/22/2014 95* 12 - 46 % Final  . Lymphs Abs 02/22/2014 185.8* 0.7 - 4.0 K/uL Final  . Monocytes Relative 02/22/2014 2* 3 - 12 % Final  . Monocytes Absolute 02/22/2014 3.7* 0.1 - 1.0 K/uL Final  . Eosinophils Relative 02/22/2014 0  0 - 5 % Final  . Eosinophils Absolute 02/22/2014 0.4  0.0 - 0.7 K/uL Final  . Basophils Relative 02/22/2014 1  0 - 1 % Final  . Basophils Absolute 02/22/2014 1.0* 0.0 - 0.1 K/uL Final  . WBC Morphology 02/22/2014 RESULT REPEATED AND VERIFIED   Final   Comment: ABSOLUTE LYMPHOCYTOSIS                          ATYPICAL LYMPHOCYTES                          SMUDGE CELLS  . Sodium 02/22/2014 136* 137 - 147 mEq/L Final  . Potassium 02/22/2014 5.2  3.7 - 5.3 mEq/L Final  . Chloride 02/22/2014 99  96 - 112 mEq/L Final  . CO2 02/22/2014 26  19 - 32 mEq/L Final  . Glucose, Bld 02/22/2014 103* 70 - 99 mg/dL Final  . BUN 02/22/2014 24* 6 - 23 mg/dL Final  . Creatinine, Ser 02/22/2014 1.80* 0.50 - 1.35 mg/dL Final  . Calcium 02/22/2014 8.9  8.4 - 10.5 mg/dL Final  . Total  Protein 02/22/2014 8.1  6.0 - 8.3 g/dL Final  . Albumin 02/22/2014 3.3* 3.5 - 5.2 g/dL Final  . AST 02/22/2014 30  0 - 37 U/L Final  . ALT 02/22/2014 29  0 - 53 U/L Final  . Alkaline Phosphatase 02/22/2014 121* 39 - 117 U/L Final  . Total Bilirubin 02/22/2014 0.4  0.3 - 1.2 mg/dL Final  . GFR calc non Af Amer 02/22/2014 35* >90 mL/min Final  . GFR calc Af Amer 02/22/2014 40* >90 mL/min Final   Comment: (NOTE)                          The eGFR has been calculated using the CKD EPI equation.                          This calculation has not been validated in all clinical situations.                          eGFR's persistently <90 mL/min signify possible Chronic Kidney                          Disease.  . Anion gap 02/22/2014 11  5 - 15 Final  . LDH 02/22/2014 215  94 - 250 U/L Final  . Beta-2 Microglobulin 02/22/2014 18.00* <=2.51 mg/L Final   Performed at [facility]  Lab on 02/09/2014  Component Date Value Ref Range  Status  . WBC 02/09/2014 107.6* 4.0 - 10.5 K/uL Final   Comment: RESULT REPEATED AND VERIFIED                          WHITE COUNT CONFIRMED ON SMEAR                          CRITICAL RESULT CALLED TO, READ BACK BY AND VERIFIED WITH:                          TRACY NEW ON [id number] AT 1055 BY RESSEGGER R  . RBC 02/09/2014 3.23* 4.22 - 5.81 MIL/uL Final  . Hemoglobin 02/09/2014 12.5* 13.0 - 17.0 g/dL Final  . HCT 02/09/2014 36.6* 39.0 - 52.0 % Final  . MCV 02/09/2014 113.3* 78.0 - 100.0 fL Final  . MCH 02/09/2014 38.7* 26.0 - 34.0 pg Final  . MCHC 02/09/2014 34.2  30.0 - 36.0 g/dL Final  . RDW 02/09/2014 19.3* 11.5 - 15.5 % Final  . Platelets 02/09/2014 147* 150 - 400 K/uL Final  . Neutrophils Relative % 02/09/2014 5* 43 - 77 % Final  . Lymphocytes Relative 02/09/2014 93* 12 - 46 % Final  . Monocytes Relative 02/09/2014 0* 3 - 12 % Final  . Eosinophils Relative 02/09/2014 2  0 - 5 % Final  . Basophils Relative 02/09/2014 0  0 - 1 % Final  . Band Neutrophils  02/09/2014 0  0 - 10 % Final  . Metamyelocytes Relative 02/09/2014 0   Final  . Myelocytes 02/09/2014 0   Final  . Promyelocytes Absolute 02/09/2014 0   Final  . Blasts 02/09/2014 0   Final  . nRBC 02/09/2014 0  0 /100 WBC Final  . Neutro Abs 02/09/2014 5.4  1.7 - 7.7 K/uL Final  . Lymphs Abs 02/09/2014 100.0* 0.7 - 4.0 K/uL Final  . Monocytes Absolute 02/09/2014 0.0* 0.1 - 1.0 K/uL Final  . Eosinophils Absolute 02/09/2014 2.2* 0.0 - 0.7 K/uL Final  . Basophils Absolute 02/09/2014 0.0  0.0 - 0.1 K/uL Final  . WBC Morphology 02/09/2014 ABSOLUTE LYMPHOCYTOSIS   Final   Comment: ATYPICAL LYMPHOCYTES                          SMUDGE CELLS    PATHOLOGY: No new pathology.  Urinalysis    Component Value Date/Time   COLORURINE YELLOW 11/03/2010 0705   APPEARANCEUR CLEAR 11/03/2010 0705   LABSPEC 1.020 11/03/2010 0705   PHURINE 5.5 11/03/2010 0705   GLUCOSEU NEGATIVE 11/03/2010 0705   HGBUR SMALL* 11/03/2010 0705   BILIRUBINUR NEGATIVE 11/03/2010 0705   KETONESUR TRACE* 11/03/2010 0705   PROTEINUR TRACE* 11/03/2010 0705   UROBILINOGEN 0.2 11/03/2010 0705   NITRITE NEGATIVE 11/03/2010 0705   LEUKOCYTESUR NEGATIVE 11/03/2010 0705    RADIOGRAPHIC STUDIES: No results found.  ASSESSMENT:  #1. 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 #2. Recent episode of disseminated zoster with radical conjunctivitis, resolved, currently on prophylactic acyclovir.   PLAN:  #1. Pneumococcal vaccine given today. Patient has already received influenza virus vaccine from his family physician. #2. Continue ibrutinib 4 the 20 mg daily along with acyclovir 200 mg twice a day and prednisone 20 mg daily. #3. Followup in 3 weeks with CBC.   All questions were answered. The patient knows to call the clinic with any problems, questions or concerns. We  can certainly see the patient much sooner if necessary.   I spent 25 minutes counseling the patient face to face. The total time spent in  the appointment was 30 minutes.    Doroteo Bradford, MD 03/02/2014 1:21 PM  DISCLAIMER:  This note was dictated with voice recognition software.  Similar sounding words can inadvertently be transcribed inaccurately and may not be corrected upon review.

## 2014-03-02 NOTE — Progress Notes (Signed)
Labs for cbcd 

## 2014-03-09 ENCOUNTER — Other Ambulatory Visit (HOSPITAL_COMMUNITY): Payer: Self-pay | Admitting: Oncology

## 2014-03-12 ENCOUNTER — Other Ambulatory Visit (HOSPITAL_COMMUNITY): Payer: Self-pay | Admitting: Oncology

## 2014-03-12 ENCOUNTER — Telehealth (HOSPITAL_COMMUNITY): Payer: Self-pay | Admitting: Oncology

## 2014-03-12 DIAGNOSIS — C9110 Chronic lymphocytic leukemia of B-cell type not having achieved remission: Secondary | ICD-10-CM

## 2014-03-12 MED ORDER — IBRUTINIB 140 MG PO CAPS: 420.0000 mg | ORAL_CAPSULE | Freq: Every day | ORAL | Status: DC

## 2014-03-23 ENCOUNTER — Ambulatory Visit (HOSPITAL_COMMUNITY): Payer: Medicare Other

## 2014-03-23 ENCOUNTER — Other Ambulatory Visit (HOSPITAL_COMMUNITY): Payer: Medicare Other

## 2014-03-25 ENCOUNTER — Encounter (HOSPITAL_COMMUNITY): Payer: Medicare Other

## 2014-03-25 ENCOUNTER — Encounter (HOSPITAL_COMMUNITY): Payer: Self-pay

## 2014-03-25 ENCOUNTER — Encounter (HOSPITAL_COMMUNITY): Payer: Medicare Other | Attending: Oncology

## 2014-03-25 ENCOUNTER — Encounter (HOSPITAL_COMMUNITY): Payer: Self-pay | Admitting: Oncology

## 2014-03-25 VITALS — BP 121/67 | HR 74 | Temp 97.9°F | Resp 18 | Wt 132.6 lb

## 2014-03-25 DIAGNOSIS — I776 Arteritis, unspecified: Secondary | ICD-10-CM

## 2014-03-25 DIAGNOSIS — M310 Hypersensitivity angiitis: Secondary | ICD-10-CM | POA: Insufficient documentation

## 2014-03-25 DIAGNOSIS — B029 Zoster without complications: Secondary | ICD-10-CM

## 2014-03-25 DIAGNOSIS — C9110 Chronic lymphocytic leukemia of B-cell type not having achieved remission: Secondary | ICD-10-CM | POA: Insufficient documentation

## 2014-03-25 LAB — CBC WITH DIFFERENTIAL/PLATELET
BASOS ABS: 0.1 10*3/uL (ref 0.0–0.1)
Basophils Relative: 0 % (ref 0–1)
EOS ABS: 0.1 10*3/uL (ref 0.0–0.7)
EOS PCT: 0 % (ref 0–5)
HCT: 37.7 % — ABNORMAL LOW (ref 39.0–52.0)
Hemoglobin: 12.6 g/dL — ABNORMAL LOW (ref 13.0–17.0)
Lymphocytes Relative: 83 % — ABNORMAL HIGH (ref 12–46)
Lymphs Abs: 32.2 10*3/uL — ABNORMAL HIGH (ref 0.7–4.0)
MCH: 40.1 pg — AB (ref 26.0–34.0)
MCHC: 33.4 g/dL (ref 30.0–36.0)
MCV: 120.1 fL — AB (ref 78.0–100.0)
Monocytes Absolute: 0.6 10*3/uL (ref 0.1–1.0)
Monocytes Relative: 2 % — ABNORMAL LOW (ref 3–12)
Neutro Abs: 5.7 10*3/uL (ref 1.7–7.7)
Neutrophils Relative %: 15 % — ABNORMAL LOW (ref 43–77)
Platelets: 149 10*3/uL — ABNORMAL LOW (ref 150–400)
RBC: 3.14 MIL/uL — ABNORMAL LOW (ref 4.22–5.81)
RDW: 16.1 % — AB (ref 11.5–15.5)
WBC: 38.6 10*3/uL — ABNORMAL HIGH (ref 4.0–10.5)

## 2014-03-25 MED ORDER — ACYCLOVIR 400 MG PO TABS: 400.0000 mg | ORAL_TABLET | Freq: Two times a day (BID) | ORAL | Status: DC

## 2014-03-25 NOTE — Progress Notes (Signed)
[facility]  OFFICE PROGRESS NOTE  [name], MD [place] [address]  DIAGNOSIS: CLL (chronic lymphocytic leukemia) - Plan: CBC with Differential, CBC with Differential, Reticulocytes  Leukocytoclastic vasculitis  Herpes zoster - Plan: acyclovir (ZOVIRAX) 400 MG tablet  Chief Complaint  Patient presents with  . CLL    CURRENT THERAPY:   Ibrutinib 420 mg daily, acyclovir 200 twice a day, prednisone 20 mg daily  INTERVAL HISTORY: Randall Reyes 78 y.o. male returns for followup of chronic lymphocytic leukemia and leukocytoclastic vasculitis with recent episode of disseminated herpes zoster, now on ibrutinib 420 mg daily. Disseminated zoster occurred while taking idelalisib. 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 Skin rash is clearing up well. Previously noted severe involvement with herpes zoster at the base of the lumbar spine as newly completely healed with no new vesicles. Lower 7extremityedema is controlled with Dyazide. He denies any fever, night sweats, easy satiety, sore throat, nausea, vomiting, headache, but does have chronic diarrhea without melena, hematochezia, or incontinence. He denies any dysuria, hematuria, urinary hesitancy, headache, or seizures.  MEDICAL HISTORY: Past Medical History  Diagnosis Date  . Diabetes mellitus   . Leukemia 6.22.2012    PER PATIENT  . Hypertension   . Ulcer   . Anemia   . CLL (chronic lymphocytic leukemia) 01/24/2011  . Melanoma in situ 01/24/2011  . DM (diabetes mellitus) 01/24/2011  . Mild obesity 01/24/2011  . Eczema 01/24/2011  . Port catheter in place 05/02/2012  . Shingles     INTERIM HISTORY: has CLL (chronic lymphocytic leukemia); Melanoma in situ; DM (diabetes mellitus); Mild obesity; Eczema; and Port catheter in place on his problem list.     CLL (chronic lymphocytic leukemia)   02/14/2005 Procedure  Peripheral flow cytometry- CLL (without 11q or 17 p deletion) presenting with stage 0 disease   10/25/2008 Progression Increasing WBC and symptoms suggestive of progressive disease   11/09/2008 - 01/07/2009 Chemotherapy Rituxan, fludarabine, cytoxan x 3 cycles with the discontinuation of fludarabine and cytoxan due to severe pancytopenia requiring hospitalization.   01/10/2009 Adverse Reaction Severe pancytopenia and adverse side effects   02/01/2009 - 04/01/2009 Chemotherapy Rituxan every 21 days x 3 cycles   04/02/2009 Remission Stable disease.  Chemotherapy holiday started   06/22/2013 - 06/29/2013 Chemotherapy Imbruvica daily   06/29/2013 Adverse Reaction Large right posterior leg (popliteal) ecchymosis/violaceous rash.  Likely Imbruvica-induced   07/31/2013 - 10/12/2013 Chemotherapy Zydelig (idelalisib) 150 mg BID monotherapy   10/12/2013 Adverse Reaction Transaminitis, suspected to be secondary to [place].  Dose decreased.   10/12/2013 - 11/09/2013 Chemotherapy Zydelig (idelalisib) 150 mg daily.   11/09/2013 Adverse Reaction Worsening transaminitis.  Zydelig on HOLD   11/09/2013 Treatment Plan Change Hold Zydelig   12/28/2013 Adverse Reaction Herpes Zoster outbreak.  Partial response with Famvir x 1 week, switched to acyclovir.  Progression over 1-2 weeks with development of keratoconjunctivitis bilaterally.  VZ Immune Globulin 625 mg IM given on 01/13/14.   02/08/2014 Treatment Plan Change Ibrutinib restarted 140 mg daily for one week followed by 280 mg daily followed by 420 mg daily with prophylactic acyclovir.     ALLERGIES:  is allergic to aspirin and ibuprofen.  MEDICATIONS: has a current medication list which includes the following prescription(s): acetaminophen, acyclovir, docusate calcium, fluconazole, hydrocodone-acetaminophen, ibrutinib, lisinopril, metformin, prednisone, rosuvastatin, triamterene-hydrochlorothiazide, and trifluridine.  SURGICAL HISTORY:  Past Surgical History  Procedure Laterality  Date  .  Hernia repair  2001  . Bleeding ulcer    . Portacath placement  2008  . Cataract extraction w/phaco  05/05/2012    Procedure: CATARACT EXTRACTION PHACO AND INTRAOCULAR LENS PLACEMENT (IOC);  Surgeon: Tonny Branch, MD;  Location: [facility];  Service: Ophthalmology;  Laterality: Right;  CDE:13.25  . Cataract extraction w/phaco  05/15/2012    Procedure: CATARACT EXTRACTION PHACO AND INTRAOCULAR LENS PLACEMENT (IOC);  Surgeon: Tonny Branch, MD;  Location: [facility];  Service: Ophthalmology;  Laterality: Left;  CDE:  12.32    FAMILY HISTORY: family history includes Diabetes in his father.  SOCIAL HISTORY:  reports that he has quit smoking. He quit smokeless tobacco use about 46 years ago. He reports that he does not drink alcohol or use illicit drugs.  REVIEW OF SYSTEMS:  Other than that discussed above is noncontributory.  PHYSICAL EXAMINATION: ECOG PERFORMANCE STATUS: 1 - Symptomatic but completely ambulatory  Blood pressure 121/67, pulse 74, temperature 97.9 F (36.6 C), temperature source Oral, resp. rate 18, weight 132 lb 9.6 oz (60.147 kg), SpO2 98 %.  GENERAL:alert, no distress and comfortable SKIN: skin color, texture, turgor are normal, previous lower lumbar spine involvement with herpes zoster has healed with no new vesicles. EYES: PERLA; Conjunctiva are pink and non-injected, sclera clear SINUSES: No redness or tenderness over maxillary or ethmoid sinuses OROPHARYNX:no exudate, no erythema on lips, buccal mucosa, or tongue. NECK: supple, thyroid normal size, non-tender, without nodularity. No masses CHEST: increased AP diameter with no breast masses. LYMPH:  no palpable lymphadenopathy in the cervical, axillary or inguinal LUNGS: clear to auscultation and percussion with normal breathing effort HEART: regular rate & rhythm and no murmurs. ABDOMEN:abdomen soft, non-tender and normal bowel sounds. No hepatomegaly, ascites, or CVA tenderness. MUSCULOSKELETAL:no cyanosis of digits and  no clubbing. Range of motion normal.  NEURO: alert & oriented x 3 with fluent speech, no focal motor/sensory deficits   LABORATORY DATA:  Results for MIKHAEL, HENDRIKS (MRN [id number]) as of 03/25/2014 15:29  Ref. Range 01/11/2014 13:30 02/09/2014 10:15 02/22/2014 13:22 03/02/2014 12:30 03/25/2014 13:46  WBC Latest Range: 4.0-10.5 K/uL 80.2 (HH) 107.6 (HH) 195.0 (HH) 156.6 (HH) 38.6 (H)     Appointment on 03/25/2014  Component Date Value Ref Range Status  . WBC 03/25/2014 38.6* 4.0 - 10.5 K/uL Final  . RBC 03/25/2014 3.14* 4.22 - 5.81 MIL/uL Final  . Hemoglobin 03/25/2014 12.6* 13.0 - 17.0 g/dL Final  . HCT 03/25/2014 37.7* 39.0 - 52.0 % Final  . MCV 03/25/2014 120.1* 78.0 - 100.0 fL Final  . MCH 03/25/2014 40.1* 26.0 - 34.0 pg Final  . MCHC 03/25/2014 33.4  30.0 - 36.0 g/dL Final  . RDW 03/25/2014 16.1* 11.5 - 15.5 % Final  . Platelets 03/25/2014 149* 150 - 400 K/uL Final  . Neutrophils Relative % 03/25/2014 15* 43 - 77 % Final  . Neutro Abs 03/25/2014 5.7  1.7 - 7.7 K/uL Final  . Lymphocytes Relative 03/25/2014 83* 12 - 46 % Final  . Lymphs Abs 03/25/2014 32.2* 0.7 - 4.0 K/uL Final  . Monocytes Relative 03/25/2014 2* 3 - 12 % Final  . Monocytes Absolute 03/25/2014 0.6  0.1 - 1.0 K/uL Final  . Eosinophils Relative 03/25/2014 0  0 - 5 % Final  . Eosinophils Absolute 03/25/2014 0.1  0.0 - 0.7 K/uL Final  . Basophils Relative 03/25/2014 0  0 - 1 % Final  . Basophils Absolute 03/25/2014 0.1  0.0 - 0.1 K/uL Final  . WBC Morphology 03/25/2014 ABSOLUTE LYMPHOCYTOSIS  Final   Comment: ATYPICAL LYMPHOCYTES SMUDGE CELLS   . RBC Morphology 03/25/2014 POLYCHROMASIA PRESENT   Final  Lab on 03/02/2014  Component Date Value Ref Range Status  . WBC 03/02/2014 156.6* 4.0 - 10.5 K/uL Final   Comment: RESULT REPEATED AND VERIFIED                          WHITE COUNT CONFIRMED ON SMEAR                          CRITICAL RESULT CALLED TO, READ BACK BY AND VERIFIED WITH:                           TAMMY ROBERTSON RN ON 102015 AT 1300 BY RESSEGGER R  . RBC 03/02/2014 2.99* 4.22 - 5.81 MIL/uL Final  . Hemoglobin 03/02/2014 11.0* 13.0 - 17.0 g/dL Final  . HCT 03/02/2014 35.8* 39.0 - 52.0 % Final  . MCV 03/02/2014 119.7* 78.0 - 100.0 fL Final  . MCH 03/02/2014 36.8* 26.0 - 34.0 pg Final  . MCHC 03/02/2014 30.7  30.0 - 36.0 g/dL Final  . RDW 03/02/2014 19.0* 11.5 - 15.5 % Final  . Platelets 03/02/2014 200  150 - 400 K/uL Final  . Neutrophils Relative % 03/02/2014 8* 43 - 77 % Final  . Neutro Abs 03/02/2014 12.3* 1.7 - 7.7 K/uL Final  . Lymphocytes Relative 03/02/2014 90* 12 - 46 % Final  . Lymphs Abs 03/02/2014 141.4* 0.7 - 4.0 K/uL Final  . Monocytes Relative 03/02/2014 2* 3 - 12 % Final  . Monocytes Absolute 03/02/2014 2.4* 0.1 - 1.0 K/uL Final  . Eosinophils Relative 03/02/2014 0  0 - 5 % Final  . Eosinophils Absolute 03/02/2014 0.0  0.0 - 0.7 K/uL Final  . Basophils Relative 03/02/2014 0  0 - 1 % Final  . Basophils Absolute 03/02/2014 0.6* 0.0 - 0.1 K/uL Final  . WBC Morphology 03/02/2014 ABSOLUTE LYMPHOCYTOSIS   Final   Comment: ATYPICAL LYMPHOCYTES                          SMUDGE CELLS    PATHOLOGY:no new pathology.  Urinalysis    Component Value Date/Time   COLORURINE YELLOW 11/03/2010 0705   APPEARANCEUR CLEAR 11/03/2010 0705   LABSPEC 1.020 11/03/2010 0705   PHURINE 5.5 11/03/2010 0705   GLUCOSEU NEGATIVE 11/03/2010 0705   HGBUR SMALL* 11/03/2010 0705   BILIRUBINUR NEGATIVE 11/03/2010 0705   KETONESUR TRACE* 11/03/2010 0705   PROTEINUR TRACE* 11/03/2010 0705   UROBILINOGEN 0.2 11/03/2010 0705   NITRITE NEGATIVE 11/03/2010 0705   LEUKOCYTESUR NEGATIVE 11/03/2010 0705    RADIOGRAPHIC STUDIES: No results found.  ASSESSMENT:  #1. 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 #2. Recent episode of disseminated zoster with keratoconjunctivitis, resolved, currently on prophylactic acyclovir.      PLAN:  #1. Continue ibrutinib  420mg  daily along with acyclovir 400 mg twice a day and prednisone 20 mg daily. #2. Follow-up in one month with CBC, reticulocyte count.   All questions were answered. The patient knows to call the clinic with any problems, questions or concerns. We can certainly see the patient much sooner if necessary.   I spent 25 minutes counseling the patient face to face. The total time spent in the appointment was 30 minutes.    Doroteo Bradford, MD 03/25/2014 3:29 PM  DISCLAIMER:  This note was dictated with voice recognition software.  Similar sounding words can inadvertently be transcribed inaccurately and may not be corrected upon review.

## 2014-03-25 NOTE — Patient Instructions (Signed)
[place] Discharge Instructions  RECOMMENDATIONS MADE BY THE CONSULTANT AND ANY TEST RESULTS WILL BE SENT TO YOUR REFERRING PHYSICIAN.  We will see you in 4 weeks for an office visit and repeat lab work.  Please call for any questions or concerns. Continue your Acyclovir and Prednisone.  The prescriptions have been sent to your pharmacy.  Thank you for choosing [facility] to provide your oncology and hematology care.  To afford each patient quality time with our providers, please arrive at least 15 minutes before your scheduled appointment time.  With your help, our goal is to use those 15 minutes to complete the necessary work-up to ensure our physicians have the information they need to help with your evaluation and healthcare recommendations.    Effective January 1st, 2014, we ask that you re-schedule your appointment with our physicians should you arrive 10 or more minutes late for your appointment.  We strive to give you quality time with our providers, and arriving late affects you and other patients whose appointments are after yours.    Again, thank you for choosing [facility].  Our hope is that these requests will decrease the amount of time that you wait before being seen by our physicians.       _____________________________________________________________  Should you have questions after your visit to [facility], please contact our office at [phone] between the hours of 8:30 a.m. and 4:30 p.m.  Voicemails left after 4:30 p.m. will not be returned until the following business day.  For prescription refill requests, have your pharmacy contact our office with your prescription refill request.    _______________________________________________________________  We hope that we have given you very good care.  You may receive a patient satisfaction survey in the mail, please complete it and return it as soon as  possible.  We value your feedback!  _______________________________________________________________  Have you asked about our STAR program?  STAR stands for Survivorship Training and Rehabilitation, and this is a nationally recognized cancer care program that focuses on survivorship and rehabilitation.  Cancer and cancer treatments may cause problems, such as, pain, making you feel tired and keeping you from doing the things that you need or want to do. Cancer rehabilitation can help. Our goal is to reduce these troubling effects and help you have the best quality of life possible.  You may receive a survey from a nurse that asks questions about your current state of health.  Based on the survey results, all eligible patients will be referred to the [facility] program for an evaluation so we can better serve you!  A frequently asked questions sheet is available upon request.

## 2014-03-25 NOTE — Progress Notes (Signed)
Randall Reyes's reason for visit today is for labs as scheduled per MD orders.  Venipuncture performed with a 23 gauge butterfly needle to R Antecubital.  Randall Reyes tolerated procedure well and without incident; questions were answered and patient was discharged.

## 2014-03-29 ENCOUNTER — Other Ambulatory Visit (HOSPITAL_COMMUNITY): Payer: Self-pay | Admitting: Oncology

## 2014-03-29 ENCOUNTER — Telehealth (HOSPITAL_COMMUNITY): Payer: Self-pay | Admitting: Oncology

## 2014-03-29 DIAGNOSIS — B029 Zoster without complications: Secondary | ICD-10-CM

## 2014-03-29 MED ORDER — ACYCLOVIR 400 MG PO TABS: 400.0000 mg | ORAL_TABLET | Freq: Two times a day (BID) | ORAL | Status: DC

## 2014-03-29 NOTE — Telephone Encounter (Signed)
Done  Robynn Pane 03/29/2014

## 2014-04-05 ENCOUNTER — Encounter (HOSPITAL_BASED_OUTPATIENT_CLINIC_OR_DEPARTMENT_OTHER): Payer: Medicare Other

## 2014-04-05 VITALS — BP 130/69 | HR 74 | Temp 97.5°F | Resp 18

## 2014-04-05 DIAGNOSIS — Z452 Encounter for adjustment and management of vascular access device: Secondary | ICD-10-CM

## 2014-04-05 DIAGNOSIS — C9190 Lymphoid leukemia, unspecified not having achieved remission: Secondary | ICD-10-CM

## 2014-04-05 DIAGNOSIS — Z95828 Presence of other vascular implants and grafts: Secondary | ICD-10-CM

## 2014-04-05 MED ORDER — HEPARIN SOD (PORK) LOCK FLUSH 100 UNIT/ML IV SOLN
500.0000 [IU] | Freq: Once | INTRAVENOUS | Status: AC
  Administered 2014-04-05: 500 [IU] via INTRAVENOUS

## 2014-04-05 MED ORDER — HEPARIN SOD (PORK) LOCK FLUSH 100 UNIT/ML IV SOLN
INTRAVENOUS | Status: AC
  Filled 2014-04-05: qty 5

## 2014-04-05 MED ORDER — SODIUM CHLORIDE 0.9 % IJ SOLN
10.0000 mL | INTRAMUSCULAR | Status: DC | PRN
  Administered 2014-04-05: 10 mL via INTRAVENOUS
  Filled 2014-04-05: qty 10

## 2014-04-05 NOTE — Progress Notes (Signed)
Randall Reyes presented for Portacath access and flush.    Portacath located right chest wall accessed with  H 20 needle.  No blood return and  Portacath flushed with 68ml NS and 500U/43ml Heparin and needle removed intact.  Procedure tolerated well and without incident.

## 2014-04-12 ENCOUNTER — Emergency Department (HOSPITAL_COMMUNITY): Payer: Medicare Other

## 2014-04-12 ENCOUNTER — Inpatient Hospital Stay (HOSPITAL_COMMUNITY)
Admission: EM | Admit: 2014-04-12 | Discharge: 2014-04-17 | DRG: 871 | Disposition: A | Discharge status: Home / Self Care | Payer: Medicare Other | Attending: Family Medicine | Admitting: Family Medicine

## 2014-04-12 ENCOUNTER — Encounter (HOSPITAL_COMMUNITY): Payer: Self-pay | Admitting: Cardiology

## 2014-04-12 DIAGNOSIS — Z833 Family history of diabetes mellitus: Secondary | ICD-10-CM | POA: Diagnosis not present

## 2014-04-12 DIAGNOSIS — J181 Lobar pneumonia, unspecified organism: Secondary | ICD-10-CM

## 2014-04-12 DIAGNOSIS — Z87891 Personal history of nicotine dependence: Secondary | ICD-10-CM | POA: Diagnosis not present

## 2014-04-12 DIAGNOSIS — D539 Nutritional anemia, unspecified: Secondary | ICD-10-CM | POA: Diagnosis present

## 2014-04-12 DIAGNOSIS — E119 Type 2 diabetes mellitus without complications: Secondary | ICD-10-CM | POA: Diagnosis present

## 2014-04-12 DIAGNOSIS — R55 Syncope and collapse: Secondary | ICD-10-CM

## 2014-04-12 DIAGNOSIS — E860 Dehydration: Secondary | ICD-10-CM | POA: Diagnosis present

## 2014-04-12 DIAGNOSIS — A419 Sepsis, unspecified organism: Secondary | ICD-10-CM | POA: Diagnosis not present

## 2014-04-12 DIAGNOSIS — Z9221 Personal history of antineoplastic chemotherapy: Secondary | ICD-10-CM | POA: Diagnosis not present

## 2014-04-12 DIAGNOSIS — J189 Pneumonia, unspecified organism: Secondary | ICD-10-CM

## 2014-04-12 DIAGNOSIS — D7589 Other specified diseases of blood and blood-forming organs: Secondary | ICD-10-CM | POA: Diagnosis present

## 2014-04-12 DIAGNOSIS — I10 Essential (primary) hypertension: Secondary | ICD-10-CM | POA: Diagnosis present

## 2014-04-12 DIAGNOSIS — C9110 Chronic lymphocytic leukemia of B-cell type not having achieved remission: Secondary | ICD-10-CM | POA: Diagnosis present

## 2014-04-12 DIAGNOSIS — T451X5A Adverse effect of antineoplastic and immunosuppressive drugs, initial encounter: Secondary | ICD-10-CM | POA: Diagnosis present

## 2014-04-12 DIAGNOSIS — J180 Bronchopneumonia, unspecified organism: Secondary | ICD-10-CM | POA: Diagnosis present

## 2014-04-12 DIAGNOSIS — D72829 Elevated white blood cell count, unspecified: Secondary | ICD-10-CM

## 2014-04-12 LAB — HEPATIC FUNCTION PANEL
ALBUMIN: 2.7 g/dL — AB (ref 3.5–5.2)
ALT: 21 U/L (ref 0–53)
AST: 23 U/L (ref 0–37)
Alkaline Phosphatase: 50 U/L (ref 39–117)
Bilirubin, Direct: <0.2 mg/dL (ref 0.0–0.3)
Total Bilirubin: 0.5 mg/dL (ref 0.3–1.2)
Total Protein: 6 g/dL (ref 6.0–8.3)

## 2014-04-12 LAB — MRSA PCR SCREENING: MRSA by PCR: NEGATIVE

## 2014-04-12 LAB — TROPONIN I

## 2014-04-12 LAB — CBC WITH DIFFERENTIAL/PLATELET
Basophils Absolute: 0.1 10*3/uL (ref 0.0–0.1)
Basophils Relative: 0 % (ref 0–1)
EOS PCT: 0 % (ref 0–5)
Eosinophils Absolute: 0 10*3/uL (ref 0.0–0.7)
HEMATOCRIT: 38.2 % — AB (ref 39.0–52.0)
HEMOGLOBIN: 12.6 g/dL — AB (ref 13.0–17.0)
LYMPHS ABS: 10.5 10*3/uL — AB (ref 0.7–4.0)
LYMPHS PCT: 44 % (ref 12–46)
MCH: 39 pg — ABNORMAL HIGH (ref 26.0–34.0)
MCHC: 33 g/dL (ref 30.0–36.0)
MCV: 118.3 fL — AB (ref 78.0–100.0)
MONO ABS: 1.6 10*3/uL — AB (ref 0.1–1.0)
MONOS PCT: 7 % (ref 3–12)
NEUTROS ABS: 12 10*3/uL — AB (ref 1.7–7.7)
Neutrophils Relative %: 50 % (ref 43–77)
Platelets: 178 10*3/uL (ref 150–400)
RBC: 3.23 MIL/uL — AB (ref 4.22–5.81)
RDW: 13.6 % (ref 11.5–15.5)
WBC: 24.1 10*3/uL — AB (ref 4.0–10.5)

## 2014-04-12 LAB — BASIC METABOLIC PANEL
ANION GAP: 13 (ref 5–15)
BUN: 35 mg/dL — ABNORMAL HIGH (ref 6–23)
CHLORIDE: 99 meq/L (ref 96–112)
CO2: 26 mEq/L (ref 19–32)
Calcium: 8.2 mg/dL — ABNORMAL LOW (ref 8.4–10.5)
Creatinine, Ser: 1.64 mg/dL — ABNORMAL HIGH (ref 0.50–1.35)
GFR, EST AFRICAN AMERICAN: 45 mL/min — AB (ref >90)
GFR, EST NON AFRICAN AMERICAN: 39 mL/min — AB (ref >90)
Glucose, Bld: 132 mg/dL — ABNORMAL HIGH (ref 70–99)
POTASSIUM: 4.1 meq/L (ref 3.7–5.3)
Sodium: 138 mEq/L (ref 137–147)

## 2014-04-12 LAB — GLUCOSE, CAPILLARY
GLUCOSE-CAPILLARY: 284 mg/dL — AB (ref 70–99)
Glucose-Capillary: 235 mg/dL — ABNORMAL HIGH (ref 70–99)

## 2014-04-12 LAB — LACTIC ACID, PLASMA: LACTIC ACID, VENOUS: 1.5 mmol/L (ref 0.5–2.2)

## 2014-04-12 LAB — POC OCCULT BLOOD, ED: FECAL OCCULT BLD: POSITIVE — AB

## 2014-04-12 MED ORDER — CETYLPYRIDINIUM CHLORIDE 0.05 % MT LIQD
7.0000 mL | Freq: Two times a day (BID) | OROMUCOSAL | Status: DC
  Administered 2014-04-12 – 2014-04-16 (×9): 7 mL via OROMUCOSAL

## 2014-04-12 MED ORDER — ONDANSETRON HCL 4 MG PO TABS: 4.0000 mg | ORAL_TABLET | Freq: Four times a day (QID) | ORAL | Status: DC | PRN

## 2014-04-12 MED ORDER — PREDNISONE 20 MG PO TABS
20.0000 mg | ORAL_TABLET | Freq: Every day | ORAL | Status: DC
  Administered 2014-04-13 – 2014-04-17 (×5): 20 mg via ORAL
  Filled 2014-04-12 (×5): qty 1

## 2014-04-12 MED ORDER — VANCOMYCIN HCL IN DEXTROSE 1-5 GM/200ML-% IV SOLN
1000.0000 mg | Freq: Once | INTRAVENOUS | Status: DC
  Filled 2014-04-12: qty 200

## 2014-04-12 MED ORDER — SODIUM CHLORIDE 0.9 % IV SOLN
INTRAVENOUS | Status: AC
  Administered 2014-04-12: 15:00:00 via INTRAVENOUS

## 2014-04-12 MED ORDER — MORPHINE SULFATE 2 MG/ML IJ SOLN: 2.0000 mg | INTRAMUSCULAR | Status: DC | PRN

## 2014-04-12 MED ORDER — VANCOMYCIN HCL IN DEXTROSE 750-5 MG/150ML-% IV SOLN
750.0000 mg | INTRAVENOUS | Status: DC
  Administered 2014-04-12: 1 mg via INTRAVENOUS
  Administered 2014-04-13 – 2014-04-16 (×4): 750 mg via INTRAVENOUS
  Filled 2014-04-12 (×9): qty 150

## 2014-04-12 MED ORDER — INSULIN ASPART 100 UNIT/ML ~~LOC~~ SOLN
0.0000 [IU] | Freq: Three times a day (TID) | SUBCUTANEOUS | Status: DC
  Administered 2014-04-12: 5 [IU] via SUBCUTANEOUS
  Administered 2014-04-12: 8 [IU] via SUBCUTANEOUS
  Administered 2014-04-13 (×2): 5 [IU] via SUBCUTANEOUS
  Administered 2014-04-14: 11 [IU] via SUBCUTANEOUS
  Administered 2014-04-14: 2 [IU] via SUBCUTANEOUS
  Administered 2014-04-14: 5 [IU] via SUBCUTANEOUS
  Administered 2014-04-15: 11 [IU] via SUBCUTANEOUS
  Administered 2014-04-15: 3 [IU] via SUBCUTANEOUS
  Administered 2014-04-15: 5 [IU] via SUBCUTANEOUS
  Administered 2014-04-16: 2 [IU] via SUBCUTANEOUS
  Administered 2014-04-16 (×2): 8 [IU] via SUBCUTANEOUS
  Administered 2014-04-17: 2 [IU] via SUBCUTANEOUS

## 2014-04-12 MED ORDER — CEFEPIME HCL 1 G IJ SOLR
1.0000 g | Freq: Three times a day (TID) | INTRAMUSCULAR | Status: DC
  Filled 2014-04-12 (×4): qty 1

## 2014-04-12 MED ORDER — ROSUVASTATIN CALCIUM 20 MG PO TABS
20.0000 mg | ORAL_TABLET | Freq: Every day | ORAL | Status: DC
  Administered 2014-04-13 – 2014-04-17 (×5): 20 mg via ORAL
  Filled 2014-04-12 (×5): qty 1

## 2014-04-12 MED ORDER — ACETAMINOPHEN 650 MG RE SUPP: 650.0000 mg | Freq: Four times a day (QID) | RECTAL | Status: DC | PRN

## 2014-04-12 MED ORDER — ACETAMINOPHEN 325 MG PO TABS: 650.0000 mg | ORAL_TABLET | Freq: Four times a day (QID) | ORAL | Status: DC | PRN

## 2014-04-12 MED ORDER — INSULIN ASPART 100 UNIT/ML ~~LOC~~ SOLN
0.0000 [IU] | Freq: Every day | SUBCUTANEOUS | Status: DC
  Administered 2014-04-13 – 2014-04-15 (×3): 3 [IU] via SUBCUTANEOUS

## 2014-04-12 MED ORDER — DEXTROSE 5 % IV SOLN
1.0000 g | INTRAVENOUS | Status: DC
  Administered 2014-04-12 – 2014-04-13 (×2): 1 g via INTRAVENOUS
  Filled 2014-04-12 (×3): qty 1

## 2014-04-12 MED ORDER — SODIUM CHLORIDE 0.9 % IV BOLUS (SEPSIS)
500.0000 mL | Freq: Once | INTRAVENOUS | Status: AC
  Administered 2014-04-12: 500 mL via INTRAVENOUS

## 2014-04-12 MED ORDER — SODIUM CHLORIDE 0.9 % IV SOLN: INTRAVENOUS | Status: DC

## 2014-04-12 MED ORDER — SODIUM CHLORIDE 0.9 % IV BOLUS (SEPSIS)
500.0000 mL | Freq: Once | INTRAVENOUS | Status: AC
Start: 2014-04-12 — End: 2014-04-12
  Administered 2014-04-12: 500 mL via INTRAVENOUS

## 2014-04-12 MED ORDER — PIPERACILLIN-TAZOBACTAM 3.375 G IVPB
3.3750 g | Freq: Once | INTRAVENOUS | Status: DC
  Administered 2014-04-12: 3.375 g via INTRAVENOUS
  Filled 2014-04-12: qty 50

## 2014-04-12 MED ORDER — SODIUM CHLORIDE 0.9 % IJ SOLN
3.0000 mL | Freq: Two times a day (BID) | INTRAMUSCULAR | Status: DC
  Administered 2014-04-12 – 2014-04-17 (×9): 3 mL via INTRAVENOUS

## 2014-04-12 MED ORDER — HEPARIN SODIUM (PORCINE) 5000 UNIT/ML IJ SOLN
5000.0000 [IU] | Freq: Three times a day (TID) | INTRAMUSCULAR | Status: DC
  Administered 2014-04-12 – 2014-04-17 (×14): 5000 [IU] via SUBCUTANEOUS
  Filled 2014-04-12 (×15): qty 1

## 2014-04-12 MED ORDER — SODIUM CHLORIDE 0.9 % IV BOLUS (SEPSIS)
1000.0000 mL | Freq: Once | INTRAVENOUS | Status: AC
  Administered 2014-04-12: 1000 mL via INTRAVENOUS

## 2014-04-12 MED ORDER — ONDANSETRON HCL 4 MG/2ML IJ SOLN: 4.0000 mg | Freq: Four times a day (QID) | INTRAMUSCULAR | Status: DC | PRN

## 2014-04-12 MED ORDER — ACYCLOVIR 400 MG PO TABS
400.0000 mg | ORAL_TABLET | Freq: Two times a day (BID) | ORAL | Status: DC
  Administered 2014-04-12 – 2014-04-13 (×2): 400 mg via ORAL
  Filled 2014-04-12 (×4): qty 1

## 2014-04-12 MED ORDER — IBRUTINIB 140 MG PO CAPS
420.0000 mg | ORAL_CAPSULE | Freq: Every day | ORAL | Status: DC
  Administered 2014-04-12 – 2014-04-17 (×6): 420 mg via ORAL

## 2014-04-12 NOTE — Plan of Care (Signed)
Problem: Consults Goal: General Medical Patient Education See Patient Education Module for specific education. Outcome: Completed/Met Date Met:  04/12/14 Goal: Diabetes Guidelines if Diabetic/Glucose > 140 If diabetic or lab glucose is > 140 mg/dl - Initiate Diabetes/Hyperglycemia Guidelines & Document Interventions  Outcome: Completed/Met Date Met:  04/12/14

## 2014-04-12 NOTE — Progress Notes (Signed)
[place] for Vancomycin & Cefepime Indication: pneumonia  Allergies  Allergen Reactions  . Aspirin Other (See Comments)    Bleeding ulcers.   . Ibuprofen Other (See Comments)    Bleeding ulcers.    Patient Measurements: Height: 5\' 2"  (157.5 cm) Weight: 135 lb (61.236 kg) IBW/kg (Calculated) : 54.6  Vital Signs: Temp: 98.3 F (36.8 C) (11/30 0918) Temp Source: Oral (11/30 0918) BP: 86/60 mmHg (11/30 1210) Pulse Rate: 112 (11/30 1210) Intake/Output from previous day:   Intake/Output from this shift:    Labs:  Recent Labs  04/12/14 0920  WBC 24.1*  HGB 12.6*  PLT 178  CREATININE 1.64*   Estimated Creatinine Clearance: 29.6 mL/min (by C-G formula based on Cr of 1.64). No results for input(s): VANCOTROUGH, VANCOPEAK, VANCORANDOM, GENTTROUGH, GENTPEAK, GENTRANDOM, TOBRATROUGH, TOBRAPEAK, TOBRARND, AMIKACINPEAK, AMIKACINTROU, AMIKACIN in the last 72 hours.   Microbiology: Recent Results (from the past 720 hour(s))  Blood culture (routine x 2)     Status: None (Preliminary result)   Collection Time: 04/12/14 11:08 AM  Result Value Ref Range Status   Specimen Description BLOOD  Final   Special Requests Immunocompromised  Final   Culture NO GROWTH <24 HRS  Final   Report Status PENDING  Incomplete  Blood culture (routine x 2)     Status: None (Preliminary result)   Collection Time: 04/12/14 11:28 AM  Result Value Ref Range Status   Specimen Description BLOOD  Final   Special Requests Immunocompromised  Final   Culture NO GROWTH <24 HRS  Final   Report Status PENDING  Incomplete    Anti-infectives    Start     Dose/Rate Route Frequency Ordered Stop   04/12/14 1600  ceFEPIme (MAXIPIME) 1 g in dextrose 5 % 50 mL IVPB     1 g100 mL/hr over 30 Minutes Intravenous Every 24 hours 04/12/14 1301 04/20/14 1559   04/12/14 1400  ceFEPIme (MAXIPIME) 1 g in dextrose 5 % 50 mL IVPB  Status:  Discontinued     1 g100 mL/hr over 30 Minutes  Intravenous 3 times per day 04/12/14 1155 04/12/14 1300   04/12/14 1300  vancomycin (VANCOCIN) IVPB 750 mg/150 ml premix     750 mg150 mL/hr over 60 Minutes Intravenous Every 24 hours 04/12/14 1258     04/12/14 1215  acyclovir (ZOVIRAX) tablet 400 mg     400 mg Oral 2 times daily 04/12/14 1202     04/12/14 1100  vancomycin (VANCOCIN) IVPB 1000 mg/200 mL premix  Status:  Discontinued     1,000 mg200 mL/hr over 60 Minutes Intravenous  Once 04/12/14 1058 04/12/14 1258   04/12/14 1100  piperacillin-tazobactam (ZOSYN) IVPB 3.375 g  Status:  Discontinued     3.375 g12.5 mL/hr over 240 Minutes Intravenous  Once 04/12/14 1058 04/12/14 1259      Assessment: 77 yoM currently undergoing treatment for CLL presented with increasing productive cough, sinus drainage, sweats, & near syncope.   He was hypotensive with elevated WBC on admission.  No fevers noted & lactic acid level normal.  CXR + RLL PNA.  Renal function is at patient's baseline.  Estimated CrCl ~ 36ml/min.  Vancomycin 11/30>> Cefepime 11/30>>  Goal of Therapy:  Vancomycin trough level 15-20 mcg/ml  Plan:  Decrease Cefepime 1gm IV q24h Vancomycin 750mg  IV q24h Check Vancomycin trough at steady state Monitor renal function and cx data   Biagio Borg [id number],1:05 PM

## 2014-04-12 NOTE — H&P (Signed)
[facility] History and Physical  Randall Reyes JSE:[id number] DOB: [birth year] DOA: 123-Mar-202015  Referring physician: Emergency department PCP: [name], MD  Specialists:   Chief Complaint: Near syncope  HPI: Randall Reyes is a 78 y.o. male  With a hx of dm, CLL undergoing tx, HTN who presents with sweats and near syncope on the AM of admission. Pt reports increasing productive cough several days prior to admit. In the ED, pt was noted to have CXR findings of a RLL consolidation with a WBC of 24k. The patient presented hypotensive with sbp in the 70-80's, as low as the 60's documented. The patient was started on IVF and given vanc and zosyn. Hospitalist was consulted for consideration for admission.  Review of Systems:   Per above, the remainder of the 10pt ros reviewed and are neg  Past Medical History  Diagnosis Date  . Diabetes mellitus   . Leukemia 6.22.2012    PER PATIENT  . Hypertension   . Ulcer   . Anemia   . CLL (chronic lymphocytic leukemia) 01/24/2011  . Melanoma in situ 01/24/2011  . DM (diabetes mellitus) 01/24/2011  . Mild obesity 01/24/2011  . Eczema 01/24/2011  . Port catheter in place 05/02/2012  . Shingles    Past Surgical History  Procedure Laterality Date  . Hernia repair  2001  . Bleeding ulcer    . Portacath placement  2008  . Cataract extraction w/phaco  05/05/2012    Procedure: CATARACT EXTRACTION PHACO AND INTRAOCULAR LENS PLACEMENT (IOC);  Surgeon: Tonny Branch, MD;  Location: [facility];  Service: Ophthalmology;  Laterality: Right;  CDE:13.25  . Cataract extraction w/phaco  05/15/2012    Procedure: CATARACT EXTRACTION PHACO AND INTRAOCULAR LENS PLACEMENT (IOC);  Surgeon: Tonny Branch, MD;  Location: [facility];  Service: Ophthalmology;  Laterality: Left;  CDE:  12.32   Social History:  reports that he has quit smoking. He quit smokeless tobacco use about 46 years ago. He reports that he does not drink alcohol or use illicit drugs.  where does  patient live--home, ALF, SNF? and with whom if at home?  Can patient participate in ADLs?  Allergies  Allergen Reactions  . Aspirin Other (See Comments)    Bleeding ulcers.   . Ibuprofen Other (See Comments)    Bleeding ulcers.    Family History  Problem Relation Age of Onset  . Diabetes Father     (be sure to complete)  Prior to Admission medications   Medication Sig Start Date End Date Taking? Authorizing Provider  acetaminophen (TYLENOL) 325 MG tablet Take 650 mg by mouth every 6 (six) hours as needed. Pain.   Yes Historical Provider, MD  acyclovir (ZOVIRAX) 400 MG tablet Take 1 tablet (400 mg total) by mouth 2 (two) times daily. 03/29/14  Yes Baird Cancer, PA-C  Docusate Calcium (STOOL SOFTENER PO) Take 1 capsule by mouth as needed (constipation).    Yes Historical Provider, MD  ibrutinib (IMBRUVICA) 140 MG capsul Take 3 capsules (420 mg total) by mouth daily. 03/12/14  Yes Manon Hilding Kefalas, PA-C  lisinopril (PRINIVIL,ZESTRIL) 5 MG tablet Take 2.5 mg by mouth daily.  04/16/11  Yes Historical Provider, MD  metFORMIN (GLUCOPHAGE) 500 MG tablet Take 500-1,000 mg by mouth 2 (two) times daily with a meal. 2 tablets in the morning and 1 tablet in the evening.   Yes Historical Provider, MD  predniSONE (DELTASONE) 20 MG tablet Take 1 tablet daily or as directed Patient taking differently: Take 20 mg  by mouth daily with breakfast.  02/23/14  Yes Farrel Gobble, MD  rosuvastatin (CRESTOR) 20 MG tablet Take 20 mg by mouth daily.    Yes Historical Provider, MD  triamterene-hydrochlorothiazide (DYAZIDE) 37.5-25 MG per capsule Take 1 capsule by mouth daily.   Yes Historical Provider, MD  fluconazole (DIFLUCAN) 200 MG tablet Take 1 tablet (200 mg total) by mouth daily. Patient not taking: Reported on 1September 25, 202015 01/04/14   Baird Cancer, PA-C  HYDROcodone-acetaminophen (NORCO) 5-325 MG per tablet Take 1 tablet by mouth every 6 (six) hours as needed for moderate pain. Patient not taking:  Reported on 1September 25, 202015 01/04/14   Manon Hilding Kefalas, PA-C  trifluridine (VIROPTIC) 1 % ophthalmic solution 1 drop in each eye every 2 hours while awake. Patient not taking: Reported on 1September 25, 202015 01/11/14   Farrel Gobble, MD   Physical Exam: Filed Vitals:   04/12/14 1000 04/12/14 1010 04/12/14 1020 04/12/14 1030  BP: 83/54 88/55 89/53  102/56  Pulse: 108 111 112 101  Temp:      TempSrc:      Resp: 24 19 17 25   Height:      Weight:      SpO2: 89% 93% 96% 95%    General:  Awake, in nad  Eyes: PERRL B  ENT: membranes moist, dentition fair  Neck: trachea midline, neck supple  Cardiovascular: regular, s1, s2  Respiratory: normal resp effort, no wheezing  Abdomen: soft,nondistended  Skin: normal skin turgor, no abnormal skin lesions seen  Musculoskeletal: perfused, no clubbing  Psychiatric: mood/affect normal//no auditory/visual hallucinations  Neurologic: cn2-12 grossly intact/strength/sensation intact  Labs on Admission:  Basic Metabolic Panel:  Recent Labs Lab 04/12/14 0920  NA 138  K 4.1  CL 99  CO2 26  GLUCOSE 132*  BUN 35*  CREATININE 1.64*  CALCIUM 8.2*   Liver Function Tests:  Recent Labs Lab 04/12/14 0920  AST 23  ALT 21  ALKPHOS 50  BILITOT 0.5  PROT 6.0  ALBUMIN 2.7*   No results for input(s): LIPASE, AMYLASE in the last 168 hours. No results for input(s): AMMONIA in the last 168 hours. CBC:  Recent Labs Lab 04/12/14 0920  WBC 24.1*  NEUTROABS 12.0*  HGB 12.6*  HCT 38.2*  MCV 118.3*  PLT 178   Cardiac Enzymes:  Recent Labs Lab 04/12/14 0920  TROPONINI <0.30    BNP (last 3 results) No results for input(s): PROBNP in the last 8760 hours. CBG: No results for input(s): GLUCAP in the last 168 hours.  Radiological Exams on Admission: Dg Chest Portable 1 View  1September 25, 202015   CLINICAL DATA:  Diaphoresis and weakness.  History of leukemia  EXAM: PORTABLE CHEST - 1 VIEW  COMPARISON:  None.  FINDINGS: There is consolidation in the  right lower lobe. There is mild left base atelectasis. Heart size and pulmonary vascularity are normal. No adenopathy. Port-A-Cath tip is in the superior vena cava. No pneumothorax. No bone lesions.  IMPRESSION: Right lower lobe consolidation.  Mild atelectasis left base.   Electronically Signed   By: Lowella Grip M.D.   On: 1September 25, 202015 09:43    EKG: Independently reviewed. Sinus tach  Assessment/Plan Principal Problem:   Sepsis due to pneumonia Active Problems:   CLL (chronic lymphocytic leukemia)   DM (diabetes mellitus)   Pneumonia   Leukocytosis   1. Sepsis with Pneumonia 1. Given hx of CLL and immunocompromised state, will treat as HCAP with cefepime and vanc empirically 2. Will check pan-cultures 3. Cont with vol resuscitation as tolerated  4. Admit to stepdown given hypotension 2. CLL 1. Stable and followed by Heme-Onc 3. DM 1. Will cont on SSI coverage 4. Leukocytosis 1. Likely secondary to combination of sepsis with CLL 5. Hypotension 1. Volume resuscitation as tolerated 6. DVT prophylaxis 1. Heparin subQ  Code Status: Full  Family Communication: Pt in room (indicate person spoken with, if applicable, with phone number if by telephone) Disposition Plan: Pending (indicate anticipated LOS)  Time spent: 89min  [facility], [facility] Pager [phone]  If 7PM-7AM, please contact night-coverage [email or web address] Password [email or web address] [id number], 11:32 AM

## 2014-04-12 NOTE — ED Provider Notes (Signed)
CSN: [id number]     Arrival date & time 04/12/14  0907 History  This chart was scribed for Maudry Diego, MD by Stephania Fragmin, ED Scribe. This patient was seen in room APA02/APA02 and the patient's care was started at 9:33 AM.    Chief Complaint  Patient presents with  . Near Syncope   Patient is a 78 y.o. male presenting with near-syncope. The history is provided by the patient and a relative. No language interpreter was used.  Near Syncope This is a new problem. The current episode started 2 days ago. The problem has not changed since onset.Pertinent negatives include no chest pain, no abdominal pain and no headaches. He has tried nothing for the symptoms.    HPI Comments: Randall Reyes is a 78 y.o. male who presents to the Emergency Department complaining of weakness that began 2 days ago. He reports low blood pressure and that he woke up this morning "in a bed full of sweat." Per wife, he reported nausea this morning, but no emesis. He has been coughing recently around the time he started taking Imbruvica. He reports only diarrhea when taking sugar pills. Patient denies pain. Dr Glenard Haring is his PCP.  Past Medical History  Diagnosis Date  . Diabetes mellitus   . Leukemia 6.22.2012    PER PATIENT  . Hypertension   . Ulcer   . Anemia   . CLL (chronic lymphocytic leukemia) 01/24/2011  . Melanoma in situ 01/24/2011  . DM (diabetes mellitus) 01/24/2011  . Mild obesity 01/24/2011  . Eczema 01/24/2011  . Port catheter in place 05/02/2012  . Shingles    Past Surgical History  Procedure Laterality Date  . Hernia repair  2001  . Bleeding ulcer    . Portacath placement  2008  . Cataract extraction w/phaco  05/05/2012    Procedure: CATARACT EXTRACTION PHACO AND INTRAOCULAR LENS PLACEMENT (IOC);  Surgeon: Tonny Branch, MD;  Location: [facility];  Service: Ophthalmology;  Laterality: Right;  CDE:13.25  . Cataract extraction w/phaco  05/15/2012    Procedure: CATARACT EXTRACTION PHACO AND  INTRAOCULAR LENS PLACEMENT (IOC);  Surgeon: Tonny Branch, MD;  Location: [facility];  Service: Ophthalmology;  Laterality: Left;  CDE:  12.32   Family History  Problem Relation Age of Onset  . Diabetes Father    History  Substance Use Topics  . Smoking status: Former Research scientist (life sciences)  . Smokeless tobacco: Former Systems developer    Quit date: 04/25/1967  . Alcohol Use: No    Review of Systems  Constitutional: Positive for diaphoresis. Negative for appetite change and fatigue.  HENT: Negative for congestion, ear discharge and sinus pressure.   Eyes: Negative for discharge.  Respiratory: Positive for cough.   Cardiovascular: Positive for near-syncope. Negative for chest pain.  Gastrointestinal: Positive for nausea, diarrhea (upon taking sugar pills) and blood in stool (as shown on rectal exam). Negative for vomiting and abdominal pain.  Genitourinary: Negative for frequency and hematuria.  Musculoskeletal: Negative for back pain.  Skin: Positive for rash (on buttocks).  Neurological: Positive for weakness. Negative for seizures and headaches.  Psychiatric/Behavioral: Negative for hallucinations.      Allergies  Aspirin and Ibuprofen  Home Medications   Prior to Admission medications   Medication Sig Start Date End Date Taking? Authorizing Provider  acetaminophen (TYLENOL) 325 MG tablet Take 650 mg by mouth every 6 (six) hours as needed. Pain.    Historical Provider, MD  acyclovir (ZOVIRAX) 400 MG tablet Take 1 tablet (400 mg  total) by mouth 2 (two) times daily. 03/29/14   Baird Cancer, PA-C  Docusate Calcium (STOOL SOFTENER PO) Take 1 capsule by mouth as needed.    Historical Provider, MD  fluconazole (DIFLUCAN) 200 MG tablet Take 1 tablet (200 mg total) by mouth daily. 01/04/14   Baird Cancer, PA-C  HYDROcodone-acetaminophen (NORCO) 5-325 MG per tablet Take 1 tablet by mouth every 6 (six) hours as needed for moderate pain. 01/04/14   Baird Cancer, PA-C  ibrutinib (IMBRUVICA) 140 MG capsul Take  3 capsules (420 mg total) by mouth daily. 03/12/14   Baird Cancer, PA-C  lisinopril (PRINIVIL,ZESTRIL) 5 MG tablet Take 5 mg by mouth daily. Takes 1/2 tablet daily 04/16/11   Historical Provider, MD  metFORMIN (GLUCOPHAGE) 500 MG tablet Take 500 mg by mouth 2 (two) times daily with a meal. 2  Am and 1 pm    Historical Provider, MD  predniSONE (DELTASONE) 20 MG tablet Take 1 tablet daily or as directed 02/23/14   Farrel Gobble, MD  rosuvastatin (CRESTOR) 20 MG tablet Take 20 mg by mouth daily.     Historical Provider, MD  triamterene-hydrochlorothiazide (DYAZIDE) 37.5-25 MG per capsule Take 1 capsule by mouth daily.    Historical Provider, MD  trifluridine (VIROPTIC) 1 % ophthalmic solution 1 drop in each eye every 2 hours while awake. 01/11/14   Farrel Gobble, MD   BP 76/50 mmHg  Pulse 114  Temp(Src) 98.3 F (36.8 C) (Oral)  Resp 22  Ht 5\' 2"  (1.575 m)  Wt 135 lb (61.236 kg)  BMI 24.69 kg/m2  SpO2 94% Physical Exam  Constitutional: He is oriented to person, place, and time. He appears well-developed.  HENT:  Head: Normocephalic.  Eyes: Conjunctivae and EOM are normal. No scleral icterus.  Neck: Neck supple. No thyromegaly present.  Cardiovascular: Regular rhythm.  Exam reveals no gallop and no friction rub.   No murmur heard. Tachycardic.  Pulmonary/Chest: No stridor. He has no wheezes. He has no rales. He exhibits no tenderness.  Abdominal: He exhibits no distension. There is no tenderness. There is no rebound.  Genitourinary: Guaiac positive stool.  Rectal exam was normal, except patient was brown stool, heme positive.  Musculoskeletal: Normal range of motion. He exhibits no edema.  Lymphadenopathy:    He has no cervical adenopathy.  Neurological: He is oriented to person, place, and time. He exhibits normal muscle tone. Coordination normal.  Skin: No rash noted. No erythema.  Mild rash on his buttocks.  Psychiatric: He has a normal mood and affect. His behavior is  normal.  Nursing note and vitals reviewed.   ED Course  Procedures (including critical care time)  DIAGNOSTIC STUDIES: Oxygen Saturation is 94% on room air, adequate by my interpretation.    COORDINATION OF CARE: 9:41 AM - Discussed treatment plan with pt at bedside and pt agreed to plan.   Labs Review Labs Reviewed  CBC WITH DIFFERENTIAL  BASIC METABOLIC PANEL    Imaging Review No results found.   EKG Interpretation   Date/Time:  Monday April 12 2014 09:14:54 EST Ventricular Rate:  125 PR Interval:  162 QRS Duration: 114 QT Interval:  312 QTC Calculation: 450 R Axis:   -94 Text Interpretation:  Sinus tachycardia Incomplete right bundle branch  block Low voltage, precordial leads Abnormal R-wave progression, late  transition Confirmed by Damya Comley  MD, Lakendria Nicastro ([id number]) on 1December 22, 202015 11:28:34  AM     CRITICAL CARE Performed by: Kaaren Nass L Total critical care time:40 Critical  care time was exclusive of separately billable procedures and treating other patients. Critical care was necessary to treat or prevent imminent or life-threatening deterioration. Critical care was time spent personally by me on the following activities: development of treatment plan with patient and/or surrogate as well as nursing, discussions with consultants, evaluation of patient's response to treatment, examination of patient, obtaining history from patient or surrogate, ordering and performing treatments and interventions, ordering and review of laboratory studies, ordering and review of radiographic studies, pulse oximetry and re-evaluation of patient's condition.   MDM   Final diagnoses:  Syncope    The chart was scribed for me under my direct supervision.  I personally performed the history, physical, and medical decision making and all procedures in the evaluation of this patient.Maudry Diego, MD 04/12/14 [phone]

## 2014-04-12 NOTE — Progress Notes (Signed)
Pt's heart rhythm has converted from A-flutter to NSR.   04/12/14 1743  ECG Intervals  PR interval 0.2  QRS interval 0.16 (NSR @ 70)  QT interval 0.38  QTc interval 0.42

## 2014-04-12 NOTE — ED Notes (Addendum)
Pt states he has felt weak for 2 days.   Had a near syncopal episode this morning and and had to sit down in the floor.  Pt states he was diaphoretic.   Hypotensive with EMS. 14-78'G systolic,  Tachycardic 956.  CBG 148.  Pt currently being treated for leukemia. C/o sinus drainage and a cough for 1-2 weeks.

## 2014-04-12 NOTE — Care Management Utilization Note (Signed)
UR review complete.  

## 2014-04-12 NOTE — ED Notes (Signed)
B/p 75/51.  Good color.  No distress.  Pt states he feels ok.  Receiving IVF normal saline.

## 2014-04-13 DIAGNOSIS — R918 Other nonspecific abnormal finding of lung field: Secondary | ICD-10-CM

## 2014-04-13 DIAGNOSIS — Z8701 Personal history of pneumonia (recurrent): Secondary | ICD-10-CM

## 2014-04-13 DIAGNOSIS — D649 Anemia, unspecified: Secondary | ICD-10-CM

## 2014-04-13 HISTORY — DX: Personal history of pneumonia (recurrent): Z87.01

## 2014-04-13 LAB — RETICULOCYTES
RBC.: 2.65 MIL/uL — ABNORMAL LOW (ref 4.22–5.81)
RETIC COUNT ABSOLUTE: 42.4 10*3/uL (ref 19.0–186.0)
Retic Ct Pct: 1.6 % (ref 0.4–3.1)

## 2014-04-13 LAB — CBC
HCT: 31.1 % — ABNORMAL LOW (ref 39.0–52.0)
Hemoglobin: 10.6 g/dL — ABNORMAL LOW (ref 13.0–17.0)
MCH: 39.7 pg — ABNORMAL HIGH (ref 26.0–34.0)
MCHC: 34.1 g/dL (ref 30.0–36.0)
MCV: 116.5 fL — AB (ref 78.0–100.0)
Platelets: 157 10*3/uL (ref 150–400)
RBC: 2.67 MIL/uL — AB (ref 4.22–5.81)
RDW: 14.4 % (ref 11.5–15.5)
WBC: 20.3 10*3/uL — ABNORMAL HIGH (ref 4.0–10.5)

## 2014-04-13 LAB — COMPREHENSIVE METABOLIC PANEL
ALT: 19 U/L (ref 0–53)
ANION GAP: 10 (ref 5–15)
AST: 21 U/L (ref 0–37)
Albumin: 2.3 g/dL — ABNORMAL LOW (ref 3.5–5.2)
Alkaline Phosphatase: 44 U/L (ref 39–117)
BUN: 25 mg/dL — AB (ref 6–23)
CALCIUM: 7.8 mg/dL — AB (ref 8.4–10.5)
CO2: 22 mEq/L (ref 19–32)
Chloride: 106 mEq/L (ref 96–112)
Creatinine, Ser: 1.16 mg/dL (ref 0.50–1.35)
GFR calc Af Amer: 69 mL/min — ABNORMAL LOW (ref >90)
GFR calc non Af Amer: 59 mL/min — ABNORMAL LOW (ref >90)
Glucose, Bld: 115 mg/dL — ABNORMAL HIGH (ref 70–99)
Potassium: 4.5 mEq/L (ref 3.7–5.3)
Sodium: 138 mEq/L (ref 137–147)
Total Bilirubin: 0.5 mg/dL (ref 0.3–1.2)
Total Protein: 5.5 g/dL — ABNORMAL LOW (ref 6.0–8.3)

## 2014-04-13 LAB — GLUCOSE, CAPILLARY
GLUCOSE-CAPILLARY: 106 mg/dL — AB (ref 70–99)
GLUCOSE-CAPILLARY: 293 mg/dL — AB (ref 70–99)
Glucose-Capillary: 115 mg/dL — ABNORMAL HIGH (ref 70–99)
Glucose-Capillary: 225 mg/dL — ABNORMAL HIGH (ref 70–99)
Glucose-Capillary: 249 mg/dL — ABNORMAL HIGH (ref 70–99)

## 2014-04-13 LAB — HIV ANTIBODY (ROUTINE TESTING W REFLEX): HIV 1&2 Ab, 4th Generation: NONREACTIVE

## 2014-04-13 LAB — STREP PNEUMONIAE URINARY ANTIGEN: STREP PNEUMO URINARY ANTIGEN: NEGATIVE

## 2014-04-13 MED ORDER — DIPHENHYDRAMINE HCL 25 MG PO CAPS
50.0000 mg | ORAL_CAPSULE | Freq: Once | ORAL | Status: AC
  Administered 2014-04-13: 50 mg via ORAL
  Filled 2014-04-13: qty 2

## 2014-04-13 MED ORDER — ACYCLOVIR 200 MG PO CAPS
400.0000 mg | ORAL_CAPSULE | Freq: Two times a day (BID) | ORAL | Status: DC
  Administered 2014-04-13 – 2014-04-17 (×8): 400 mg via ORAL
  Filled 2014-04-13 (×8): qty 2

## 2014-04-13 MED ORDER — DIPHENHYDRAMINE HCL 25 MG PO CAPS
50.0000 mg | ORAL_CAPSULE | Freq: Once | ORAL | Status: AC
  Administered 2014-04-14: 50 mg via ORAL
  Filled 2014-04-13: qty 2

## 2014-04-13 MED ORDER — IMMUNE GLOBULIN (HUMAN) 10 GM/100ML IV SOLN
1.0000 g/kg | INTRAVENOUS | Status: AC
  Administered 2014-04-13 – 2014-04-14 (×2): 60 g via INTRAVENOUS
  Filled 2014-04-13: qty 600
  Filled 2014-04-13: qty 200

## 2014-04-13 MED ORDER — ACETAMINOPHEN 325 MG PO TABS
650.0000 mg | ORAL_TABLET | Freq: Once | ORAL | Status: AC
  Administered 2014-04-14: 650 mg via ORAL
  Filled 2014-04-13: qty 2

## 2014-04-13 MED ORDER — ACETAMINOPHEN 325 MG PO TABS
650.0000 mg | ORAL_TABLET | Freq: Once | ORAL | Status: AC
  Administered 2014-04-13: 650 mg via ORAL
  Filled 2014-04-13: qty 2

## 2014-04-13 NOTE — Progress Notes (Signed)
Nutrition Brief Note  Patient identified on the Malnutrition Screening Tool (MST) Report  Wt Readings from Last 15 Encounters:  04/13/14 135 lb 9.3 oz (61.5 kg)  03/25/14 132 lb 9.6 oz (60.147 kg)  03/02/14 132 lb 8 oz (60.102 kg)  02/22/14 124 lb 6.4 oz (56.427 kg)  02/08/14 129 lb (58.514 kg)  01/25/14 131 lb 3.2 oz (59.512 kg)  01/19/14 133 lb (60.328 kg)  01/11/14 133 lb 11.2 oz (60.646 kg)  01/04/14 140 lb 12.8 oz (63.866 kg)  12/28/13 139 lb 8 oz (63.277 kg)  11/09/13 143 lb 11.2 oz (65.182 kg)  09/14/13 145 lb 11.2 oz (66.089 kg)  08/18/13 143 lb (64.864 kg)  08/03/13 143 lb 6.4 oz (65.046 kg)  07/13/13 141 lb 11.2 oz (64.275 kg)    Body mass index is 24.79 kg/(m^2). Patient meets criteria for normal weight range based on current BMI.   Current diet order is Carb Modified, patient is consuming approximately 80% of meals at this time. Labs and medications reviewed.   No nutrition interventions warranted at this time. If nutrition issues arise, please consult RD.   Shedric Fredericks A. Jimmye Norman, RD, LDN Pager: [phone] After hours Pager: [phone]

## 2014-04-13 NOTE — Plan of Care (Signed)
Problem: Phase I Progression Outcomes Goal: Hemodynamically stable Outcome: Completed/Met Date Met:  04/13/14  Problem: Phase II Progression Outcomes Goal: Progress activity as tolerated unless otherwise ordered Outcome: Progressing Goal: Vital signs remain stable Outcome: Completed/Met Date Met:  04/13/14 Goal: IV changed to normal saline lock Outcome: Completed/Met Date Met:  04/13/14 Goal: Obtain order to discontinue catheter if appropriate Outcome: Not Applicable Date Met:  49/32/41  Problem: Phase III Progression Outcomes Goal: Pain controlled on oral analgesia Outcome: Completed/Met Date Met:  04/13/14 No c/o pain Goal: Activity at appropriate level-compared to baseline (UP IN CHAIR FOR HEMODIALYSIS)  Outcome: Progressing Goal: Voiding independently Outcome: Completed/Met Date Met:  04/13/14 Goal: Foley discontinued Outcome: Not Applicable Date Met:  99/14/44  Problem: Discharge Progression Outcomes Goal: Tolerating diet Outcome: Completed/Met Date Met:  04/13/14

## 2014-04-13 NOTE — Progress Notes (Signed)
Subjective: The patient is much more comfortable this morning. And vital signs of been stable. He was admitted with right lower lobe consolidation pneumonia and he does have a history of chronic lymphocytic leukemia with elevated WBC. He is afebrile  Objective: Vital signs in last 24 hours: Temp:  [97.5 F (36.4 C)-98.4 F (36.9 C)] 97.5 F (36.4 C) (12/01 0400) Pulse Rate:  [65-122] 65 (12/01 0400) Resp:  [13-28] 16 (12/01 0500) BP: (60-145)/(45-108) 145/108 mmHg (12/01 0500) SpO2:  [89 %-99 %] 94 % (12/01 0400) Weight:  [61.236 kg (135 lb)-61.5 kg (135 lb 9.3 oz)] 61.5 kg (135 lb 9.3 oz) (12/01 0500) Weight change:  Last BM Date: 04/12/14  Intake/Output from previous day: 11/30 0701 - 12/01 0700 In: 4084.2 [P.O.:480; I.V.:1354.2; IV Piggyback:2250] Out: 1126 [Urine:1125; Stool:1] Intake/Output this shift: Total I/O In: 962.5 [I.V.:962.5] Out: 676 [Urine:675; Stool:1]  Physical Exam: Gen. appearance patient awake alert  H EENT negative  Neck supple no JVD or thyroid abnormalities  Lungs clear to P&A  Heart regular rhythm no murmurs  Abdomen the palpable organs or masses  Skin warm and dry   Recent Labs  04/12/14 0920 04/13/14 0426  WBC 24.1* 20.3*  HGB 12.6* 10.6*  HCT 38.2* 31.1*  PLT 178 157   BMET  Recent Labs  04/12/14 0920 04/13/14 0426  NA 138 138  K 4.1 4.5  CL 99 106  CO2 26 22  GLUCOSE 132* 115*  BUN 35* 25*  CREATININE 1.64* 1.16  CALCIUM 8.2* 7.8*    Studies/Results: Dg Chest Portable 1 View  12020/10/1013   CLINICAL DATA:  Diaphoresis and weakness.  History of leukemia  EXAM: PORTABLE CHEST - 1 VIEW  COMPARISON:  None.  FINDINGS: There is consolidation in the right lower lobe. There is mild left base atelectasis. Heart size and pulmonary vascularity are normal. No adenopathy. Port-A-Cath tip is in the superior vena cava. No pneumothorax. No bone lesions.  IMPRESSION: Right lower lobe consolidation.  Mild atelectasis left base.    Electronically Signed   By: Lowella Grip M.D.   On: 12020/10/1013 09:43    Medications:  . acyclovir  400 mg Oral BID  . antiseptic oral rinse  7 mL Mouth Rinse BID  . ceFEPime (MAXIPIME) IV  1 g Intravenous Q24H  . heparin  5,000 Units Subcutaneous 3 times per day  . ibrutinib  420 mg Oral Daily  . insulin aspart  0-15 Units Subcutaneous TID WC  . insulin aspart  0-5 Units Subcutaneous QHS  . predniSONE  20 mg Oral Q breakfast  . rosuvastatin  20 mg Oral Daily  . sodium chloride  3 mL Intravenous Q12H  . vancomycin  750 mg Intravenous Q24H        Assessment/Plan: 1. Bronchopneumonia right lower lobe-continue IV vancomycin 750 mg every 24 hours  2. Diabetes mellitus-continue to monitor blood sugars continue short-acting insulin if needed  Chronic lymphocytic leukemia-stable we'll obtain oncology consult  Hypotension initially currently more stable   LOS: 1 day   Chava Dulac G 04/13/2014, 6:59 AM

## 2014-04-13 NOTE — Consult Note (Signed)
[facility] Consultation Oncology  Name: Randall Reyes      MRN: [id number]    Location: IC08/IC08-01  Date: 04/13/2014 Time:9:03 AM   REFERRING PHYSICIAN:  Donne Hazel, MD  REASON FOR CONSULT:  Pt followed by Heme/Onc for CLL, currently undergoing treatment   DIAGNOSIS:  Right lower lobe consolidation of chest xray concerning for pneumonia in the setting of CLL undergoing active treatment.  HISTORY OF PRESENT ILLNESS:   Randall Reyes is a 78 year old white man who is well known to the [facility] where we have been following and treating his CLL for years with a past medical history significant for DM, and history of disseminated herpes zoster, and CLL who reported to the [facility] ED on 12020/04/2414 with near syncopal episode.    CLL (chronic lymphocytic leukemia)   02/14/2005 Procedure Peripheral flow cytometry- CLL (without 11q or 17 p deletion) presenting with stage 0 disease   10/25/2008 Progression Increasing WBC and symptoms suggestive of progressive disease   11/09/2008 - 01/07/2009 Chemotherapy Rituxan, fludarabine, cytoxan x 3 cycles with the discontinuation of fludarabine and cytoxan due to severe pancytopenia requiring hospitalization.   01/10/2009 Adverse Reaction Severe pancytopenia and adverse side effects   02/01/2009 - 04/01/2009 Chemotherapy Rituxan every 21 days x 3 cycles   04/02/2009 Remission Stable disease.  Chemotherapy holiday started   06/22/2013 - 06/29/2013 Chemotherapy Imbruvica daily   06/29/2013 Adverse Reaction Large right posterior leg (popliteal) ecchymosis/violaceous rash.  Likely Imbruvica-induced   07/31/2013 - 10/12/2013 Chemotherapy Zydelig (idelalisib) 150 mg BID monotherapy   10/12/2013 Adverse Reaction Transaminitis, suspected to be secondary to [place].  Dose decreased.   10/12/2013 - 11/09/2013 Chemotherapy Zydelig (idelalisib) 150 mg daily.   11/09/2013 Adverse Reaction Worsening transaminitis.  Zydelig on HOLD   11/09/2013 Treatment  Plan Change Hold Zydelig   12/28/2013 Adverse Reaction Herpes Zoster outbreak.  Partial response with Famvir x 1 week, switched to acyclovir.  Progression over 1-2 weeks with development of keratoconjunctivitis bilaterally.  VZ Immune Globulin 625 mg IM given on 01/13/14.   02/08/2014 Treatment Plan Change Ibrutinib restarted 140 mg daily for one week followed by 280 mg daily followed by 420 mg daily with prophylactic acyclovir.   02/08/2014 -  Chemotherapy Ibrutinib 420 mg daily   04/13/2014 [facility] Admission Right lower lobe pneumonia   I personally reviewed and went over laboratory results with the patient.  The results are noted within this dictation.  Blood counts are very stable.  I personally reviewed and went over radiographic studies with the patient.  The results are noted within this dictation.    He denies any sick contact interaction.  He denies any fevers and chills.  He admits to a cough productive of yellow sputum.  He reports that his breathing is at baseline.  He denies any complaints and oncologic ROS questioning is negative.   PAST MEDICAL HISTORY:   Past Medical History  Diagnosis Date  . Diabetes mellitus   . Leukemia 6.22.2012    PER PATIENT  . Hypertension   . Ulcer   . Anemia   . CLL (chronic lymphocytic leukemia) 01/24/2011  . Melanoma in situ 01/24/2011  . DM (diabetes mellitus) 01/24/2011  . Mild obesity 01/24/2011  . Eczema 01/24/2011  . Port catheter in place 05/02/2012  . Shingles     ALLERGIES: Allergies  Allergen Reactions  . Aspirin Other (See Comments)    Bleeding ulcers.   . Ibuprofen Other (See Comments)  Bleeding ulcers.      MEDICATIONS: I have reviewed the patient's current medications.     PAST SURGICAL HISTORY Past Surgical History  Procedure Laterality Date  . Hernia repair  2001  . Bleeding ulcer    . Portacath placement  2008  . Cataract extraction w/phaco  05/05/2012    Procedure: CATARACT EXTRACTION PHACO AND INTRAOCULAR LENS  PLACEMENT (IOC);  Surgeon: Tonny Branch, MD;  Location: [facility];  Service: Ophthalmology;  Laterality: Right;  CDE:13.25  . Cataract extraction w/phaco  05/15/2012    Procedure: CATARACT EXTRACTION PHACO AND INTRAOCULAR LENS PLACEMENT (IOC);  Surgeon: Tonny Branch, MD;  Location: [facility];  Service: Ophthalmology;  Laterality: Left;  CDE:  12.32    FAMILY HISTORY: Family History  Problem Relation Age of Onset  . Diabetes Father     SOCIAL HISTORY:  reports that he has quit smoking. He quit smokeless tobacco use about 47 years ago. He reports that he does not drink alcohol or use illicit drugs.  PERFORMANCE STATUS: The patient's performance status is 1 - Symptomatic but completely ambulatory  PHYSICAL EXAM: Most Recent Vital Signs: Blood pressure 122/64, pulse 69, temperature 97.5 F (36.4 C), temperature source Oral, resp. rate 15, height _0  (1.575 m), weight 135 lb 9.3 oz (61.5 kg), SpO2 95 %. General appearance: alert, cooperative, appears stated age and no distress Head: Normocephalic, without obvious abnormality, atraumatic Eyes: negative findings: lids and lashes normal, conjunctivae and sclerae normal and corneas clear Neck: supple, symmetrical, trachea midline Back: symmetric, no curvature. ROM normal. No CVA tenderness. Lungs: diminished breath sounds RLL and dullness to percussion RLL Heart: regular rate and rhythm, S1, S2 normal, no murmur, click, rub or gallop Abdomen: soft, non-tender; bowel sounds normal; no masses,  no organomegaly Skin: Skin color, texture, turgor normal. No rashes or lesions Neurologic: Grossly normal  LABORATORY DATA:  Results for orders placed or performed during the hospital encounter of 04/12/14 (from the past 48 hour(s))  CBC with Differential     Status: Abnormal   Collection Time: 04/12/14  9:20 AM  Result Value Ref Range   WBC 24.1 (H) 4.0 - 10.5 K/uL    Comment: WHITE COUNT CONFIRMED ON SMEAR   RBC 3.23 (L) 4.22 - 5.81 MIL/uL   Hemoglobin  12.6 (L) 13.0 - 17.0 g/dL   HCT 38.2 (L) 39.0 - 52.0 %   MCV 118.3 (H) 78.0 - 100.0 fL   MCH 39.0 (H) 26.0 - 34.0 pg   MCHC 33.0 30.0 - 36.0 g/dL   RDW 13.6 11.5 - 15.5 %   Platelets 178 150 - 400 K/uL   Neutrophils Relative % 50 43 - 77 %   Neutro Abs 12.0 (H) 1.7 - 7.7 K/uL   Lymphocytes Relative 44 12 - 46 %   Lymphs Abs 10.5 (H) 0.7 - 4.0 K/uL   Monocytes Relative 7 3 - 12 %   Monocytes Absolute 1.6 (H) 0.1 - 1.0 K/uL   Eosinophils Relative 0 0 - 5 %   Eosinophils Absolute 0.0 0.0 - 0.7 K/uL   Basophils Relative 0 0 - 1 %   Basophils Absolute 0.1 0.0 - 0.1 K/uL   WBC Morphology ATYPICAL LYMPHOCYTES     Comment: INCREASED BANDS (>20% BANDS)  Basic metabolic panel     Status: Abnormal   Collection Time: 04/12/14  9:20 AM  Result Value Ref Range   Sodium 138 137 - 147 mEq/L   Potassium 4.1 3.7 - 5.3 mEq/L   Chloride  99 96 - 112 mEq/L   CO2 26 19 - 32 mEq/L   Glucose, Bld 132 (H) 70 - 99 mg/dL   BUN 35 (H) 6 - 23 mg/dL   Creatinine, Ser 1.64 (H) 0.50 - 1.35 mg/dL   Calcium 8.2 (L) 8.4 - 10.5 mg/dL   GFR calc non Af Amer 39 (L) >90 mL/min   GFR calc Af Amer 45 (L) >90 mL/min    Comment: (NOTE) The eGFR has been calculated using the CKD EPI equation. This calculation has not been validated in all clinical situations. eGFR's persistently <90 mL/min signify possible Chronic Kidney Disease.    Anion gap 13 5 - 15  Hepatic function panel     Status: Abnormal   Collection Time: 04/12/14  9:20 AM  Result Value Ref Range   Total Protein 6.0 6.0 - 8.3 g/dL   Albumin 2.7 (L) 3.5 - 5.2 g/dL   AST 23 0 - 37 U/L   ALT 21 0 - 53 U/L   Alkaline Phosphatase 50 39 - 117 U/L   Total Bilirubin 0.5 0.3 - 1.2 mg/dL   Bilirubin, Direct <0.2 0.0 - 0.3 mg/dL   Indirect Bilirubin NOT CALCULATED 0.3 - 0.9 mg/dL  Troponin I     Status: None   Collection Time: 04/12/14  9:20 AM  Result Value Ref Range   Troponin I <0.30 <0.30 ng/mL    Comment:        Due to the release kinetics of cTnI, a  negative result within the first hours of the onset of symptoms does not rule out myocardial infarction with certainty. If myocardial infarction is still suspected, repeat the test at appropriate intervals.   HIV antibody     Status: None   Collection Time: 04/12/14  9:20 AM  Result Value Ref Range   HIV 1&2 Ab, 4th Generation NONREACTIVE NONREACTIVE    Comment: (NOTE) A NONREACTIVE HIV Ag/Ab result does not exclude HIV infection since the time frame for seroconversion is variable. If acute HIV infection is suspected, a HIV-1 RNA Qualitative TMA test is recommended. HIV-1/2 Antibody Diff         Not indicated. HIV-1 RNA, Qual TMA           Not indicated. PLEASE NOTE: This information has been disclosed to you from records whose confidentiality may be protected by state law. If your state requires such protection, then the state law prohibits you from making any further disclosure of the information without the specific written consent of the person to whom it pertains, or as otherwise permitted by law. A general authorization for the release of medical or other information is NOT sufficient for this purpose. The performance of this assay has not been clinically validated in patients less than 79 years old. Performed at [facility]   POC occult blood, ED     Status: Abnormal   Collection Time: 04/12/14  9:56 AM  Result Value Ref Range   Fecal Occult Bld POSITIVE (A) NEGATIVE  Lactic acid, plasma     Status: None   Collection Time: 04/12/14 11:08 AM  Result Value Ref Range   Lactic Acid, Venous 1.5 0.5 - 2.2 mmol/L  Blood culture (routine x 2)     Status: None (Preliminary result)   Collection Time: 04/12/14 11:08 AM  Result Value Ref Range   Specimen Description BLOOD    Special Requests Immunocompromised    Culture NO GROWTH <24 HRS    Report Status PENDING  Blood culture (routine x 2)     Status: None (Preliminary result)   Collection Time: 04/12/14 11:28 AM   Result Value Ref Range   Specimen Description BLOOD    Special Requests Immunocompromised    Culture NO GROWTH <24 HRS    Report Status PENDING   MRSA PCR Screening     Status: None   Collection Time: 04/12/14  1:30 PM  Result Value Ref Range   MRSA by PCR NEGATIVE NEGATIVE    Comment:        The GeneXpert MRSA Assay (FDA approved for NASAL specimens only), is one component of a comprehensive MRSA colonization surveillance program. It is not intended to diagnose MRSA infection nor to guide or monitor treatment for MRSA infections.   Strep pneumoniae urinary antigen     Status: None   Collection Time: 04/12/14  2:00 PM  Result Value Ref Range   Strep Pneumo Urinary Antigen NEGATIVE NEGATIVE    Comment:        Infection due to S. pneumoniae cannot be absolutely ruled out since the antigen present may be below the detection limit of the test. (NOTE) [place] Performed at [facility]   Glucose, capillary     Status: Abnormal   Collection Time: 04/12/14  2:08 PM  Result Value Ref Range   Glucose-Capillary 235 (H) 70 - 99 mg/dL  Glucose, capillary     Status: Abnormal   Collection Time: 04/12/14  4:18 PM  Result Value Ref Range   Glucose-Capillary 284 (H) 70 - 99 mg/dL  Glucose, capillary     Status: Abnormal   Collection Time: 04/12/14  9:32 PM  Result Value Ref Range   Glucose-Capillary 106 (H) 70 - 99 mg/dL  Comprehensive metabolic panel     Status: Abnormal   Collection Time: 04/13/14  4:26 AM  Result Value Ref Range   Sodium 138 137 - 147 mEq/L   Potassium 4.5 3.7 - 5.3 mEq/L   Chloride 106 96 - 112 mEq/L   CO2 22 19 - 32 mEq/L   Glucose, Bld 115 (H) 70 - 99 mg/dL   BUN 25 (H) 6 - 23 mg/dL   Creatinine, Ser 1.16 0.50 - 1.35 mg/dL   Calcium 7.8 (L) 8.4 - 10.5 mg/dL   Total Protein 5.5 (L) 6.0 - 8.3 g/dL   Albumin 2.3 (L) 3.5 - 5.2 g/dL   AST 21 0 - 37 U/L   ALT 19 0 - 53 U/L   Alkaline Phosphatase 44 39 - 117 U/L   Total Bilirubin 0.5 0.3 - 1.2 mg/dL    GFR calc non Af Amer 59 (L) >90 mL/min   GFR calc Af Amer 69 (L) >90 mL/min    Comment: (NOTE) The eGFR has been calculated using the CKD EPI equation. This calculation has not been validated in all clinical situations. eGFR's persistently <90 mL/min signify possible Chronic Kidney Disease.    Anion gap 10 5 - 15  CBC     Status: Abnormal   Collection Time: 04/13/14  4:26 AM  Result Value Ref Range   WBC 20.3 (H) 4.0 - 10.5 K/uL   RBC 2.67 (L) 4.22 - 5.81 MIL/uL   Hemoglobin 10.6 (L) 13.0 - 17.0 g/dL   HCT 31.1 (L) 39.0 - 52.0 %   MCV 116.5 (H) 78.0 - 100.0 fL   MCH 39.7 (H) 26.0 - 34.0 pg   MCHC 34.1 30.0 - 36.0 g/dL   RDW 14.4 11.5 - 15.5 %  Platelets 157 150 - 400 K/uL  Reticulocytes     Status: Abnormal   Collection Time: 04/13/14  4:26 AM  Result Value Ref Range   Retic Ct Pct 1.6 0.4 - 3.1 %   RBC. 2.65 (L) 4.22 - 5.81 MIL/uL   Retic Count, Manual 42.4 19.0 - 186.0 K/uL  Glucose, capillary     Status: Abnormal   Collection Time: 04/13/14  7:51 AM  Result Value Ref Range   Glucose-Capillary 115 (H) 70 - 99 mg/dL   Comment 1 Documented in Chart    Comment 2 Notify RN       RADIOGRAPHY: Dg Chest Portable 1 View  1July 28, 202015   CLINICAL DATA:  Diaphoresis and weakness.  History of leukemia  EXAM: PORTABLE CHEST - 1 VIEW  COMPARISON:  None.  FINDINGS: There is consolidation in the right lower lobe. There is mild left base atelectasis. Heart size and pulmonary vascularity are normal. No adenopathy. Port-A-Cath tip is in the superior vena cava. No pneumothorax. No bone lesions.  IMPRESSION: Right lower lobe consolidation.  Mild atelectasis left base.   Electronically Signed   By: Lowella Grip M.D.   On: 1July 28, 202015 09:43       PATHOLOGY:  Nothing new   ASSESSMENT:  1. Right lower lobe consolidation on chest xray on 04/12/14, concerning for CAP. 2. Leukocytosis, multifactorial with an element of reactivity from infection and an element of ibrutinib-induced  leukocytosis. 3. Normochromic, macrocytic anemia secondary to infection and CLL.  Macrocytosis is secondary to Ibrutinib 4. Dehydration, resolved 5. H/O Disseminated herpes zoster, treated with Famvir, Zoster immunoglobulin, and Acyclovir.  Patient Active Problem List   Diagnosis Date Noted  . Pneumonia 1July 28, 202015  . Sepsis due to pneumonia 1July 28, 202015  . Leukocytosis 1July 28, 202015  . Port catheter in place 05/02/2012  . CLL (chronic lymphocytic leukemia) 01/24/2011  . Melanoma in situ 01/24/2011  . DM (diabetes mellitus) 01/24/2011  . Mild obesity 01/24/2011  . Eczema 01/24/2011     PLAN:  1. I personally reviewed and went over laboratory results with the patient.  The results are noted within this dictation. 2. I personally reviewed and went over radiographic studies with the patient.  The results are noted within this dictation.   3. Chart reviewed 4. Labs today: Retic count 5. Continue antibiotics as deemed fit. 6. IVIG 1 g/kg today and tomorrow with Tylenol 650 mg PO and Benadryl 50 mg PO pre-meds. 7. Continue Acyclovir 400 mg BID 8. Continue Ibrutinib 420 mg daily. 9. Will follow along and assist in patient care while Mr. Caspers is in the hospital.  He will follow-up with [place] at the [facility] as scheduled on 04/22/2014.  All questions were answered. The patient knows to call the clinic with any problems, questions or concerns. We can certainly see the patient much sooner if necessary.  Patient and plan discussed with Dr. Farrel Gobble and he is in agreement with the aforementioned.   KEFALAS,THOMAS  04/13/2014   Pager: [phone]

## 2014-04-13 NOTE — Care Management Note (Signed)
    Page 1 of 1   04/13/2014     1:41:06 PM CARE MANAGEMENT NOTE 04/13/2014  Patient:  Randall Reyes, Randall Reyes   Account Number:  [id number]  Date Initiated:  04/13/2014  Documentation initiated by:  CHILDRESS,JESSICA  Subjective/Objective Assessment:   Pt is from home, lives with wife and is independent with ADL's. Pt ha sno HH services, DME's or med needs prior to admission.     Action/Plan:   Pt plans to discharge home with self care. No CM needs identified.   Anticipated DC Date:  04/15/2014   Anticipated DC Plan:  [place]  CM consult      Choice offered to / List presented to:             Status of service:  Completed, signed off Medicare Important Message given?   (If response is "NO", the following Medicare IM given date fields will be blank) Date Medicare IM given:   Medicare IM given by:   Date Additional Medicare IM given:   Additional Medicare IM given by:    Discharge Disposition:  HOME/SELF CARE  Per UR Regulation:    If discussed at Long Length of Stay Meetings, dates discussed:    Comments:  04/13/2014 [place], RN, MSN, [facility]

## 2014-04-14 ENCOUNTER — Inpatient Hospital Stay (HOSPITAL_COMMUNITY): Payer: Medicare Other

## 2014-04-14 DIAGNOSIS — D72828 Other elevated white blood cell count: Secondary | ICD-10-CM

## 2014-04-14 DIAGNOSIS — D630 Anemia in neoplastic disease: Secondary | ICD-10-CM

## 2014-04-14 LAB — GLUCOSE, CAPILLARY
Glucose-Capillary: 124 mg/dL — ABNORMAL HIGH (ref 70–99)
Glucose-Capillary: 207 mg/dL — ABNORMAL HIGH (ref 70–99)
Glucose-Capillary: 350 mg/dL — ABNORMAL HIGH (ref 70–99)
Glucose-Capillary: 294 mg/dL — ABNORMAL HIGH (ref 70–99)

## 2014-04-14 MED ORDER — DEXTROSE 5 % IV SOLN
2.0000 g | INTRAVENOUS | Status: DC
  Administered 2014-04-14 – 2014-04-16 (×3): 2 g via INTRAVENOUS
  Filled 2014-04-14 (×5): qty 2

## 2014-04-14 NOTE — Progress Notes (Signed)
[place] for Vancomycin & Cefepime Indication: pneumonia  Allergies  Allergen Reactions  . Aspirin Other (See Comments)    Bleeding ulcers.   . Ibuprofen Other (See Comments)    Bleeding ulcers.   Patient Measurements: Height: 5\' 2"  (157.5 cm) Weight: 135 lb 9.3 oz (61.5 kg) IBW/kg (Calculated) : 54.6  Vital Signs: Temp: 97.8 F (36.6 C) (12/02 1030) Temp Source: Oral (12/02 1030) BP: 136/63 mmHg (12/02 1030) Pulse Rate: 63 (12/02 1030) Intake/Output from previous day: 12/01 0701 - 12/02 0700 In: -  Out: 5053 [Urine:1550] Intake/Output from this shift: Total I/O In: 480 [P.O.:480] Out: -   Labs:  Recent Labs  04/12/14 0920 04/13/14 0426  WBC 24.1* 20.3*  HGB 12.6* 10.6*  PLT 178 157  CREATININE 1.64* 1.16   Estimated Creatinine Clearance: 41.8 mL/min (by C-G formula based on Cr of 1.16). No results for input(s): VANCOTROUGH, VANCOPEAK, VANCORANDOM, GENTTROUGH, GENTPEAK, GENTRANDOM, TOBRATROUGH, TOBRAPEAK, TOBRARND, AMIKACINPEAK, AMIKACINTROU, AMIKACIN in the last 72 hours.   Microbiology: Recent Results (from the past 720 hour(s))  Blood culture (routine x 2)     Status: None (Preliminary result)   Collection Time: 04/12/14 11:08 AM  Result Value Ref Range Status   Specimen Description BLOOD LEFT ARM  Final   Special Requests   Final    BOTTLES DRAWN AEROBIC AND ANAEROBIC 4CC  IMMUNE:COMPROMISED   Culture NO GROWTH 2 DAYS  Final   Report Status PENDING  Incomplete  Blood culture (routine x 2)     Status: None (Preliminary result)   Collection Time: 04/12/14 11:28 AM  Result Value Ref Range Status   Specimen Description BLOOD LEFT HAND  Final   Special Requests   Final    BOTTLES DRAWN AEROBIC AND ANAEROBIC 5CC IMMUNE:COMPROMISED   Culture NO GROWTH 2 DAYS  Final   Report Status PENDING  Incomplete  MRSA PCR Screening     Status: None   Collection Time: 04/12/14  1:30 PM  Result Value Ref Range Status   MRSA by PCR  NEGATIVE NEGATIVE Final    Comment:        The GeneXpert MRSA Assay (FDA approved for NASAL specimens only), is one component of a comprehensive MRSA colonization surveillance program. It is not intended to diagnose MRSA infection nor to guide or monitor treatment for MRSA infections.     Anti-infectives    Start     Dose/Rate Route Frequency Ordered Stop   04/14/14 1600  ceFEPIme (MAXIPIME) 2 g in dextrose 5 % 50 mL IVPB     2 g100 mL/hr over 30 Minutes Intravenous Every 24 hours 04/14/14 1105 04/20/14 1559   04/13/14 2200  acyclovir (ZOVIRAX) 200 MG capsule 400 mg     400 mg Oral 2 times daily 04/13/14 1653     04/12/14 1600  ceFEPIme (MAXIPIME) 1 g in dextrose 5 % 50 mL IVPB  Status:  Discontinued     1 g100 mL/hr over 30 Minutes Intravenous Every 24 hours 04/12/14 1301 04/14/14 1105   04/12/14 1400  ceFEPIme (MAXIPIME) 1 g in dextrose 5 % 50 mL IVPB  Status:  Discontinued     1 g100 mL/hr over 30 Minutes Intravenous 3 times per day 04/12/14 1155 04/12/14 1300   04/12/14 1300  vancomycin (VANCOCIN) IVPB 750 mg/150 ml premix     750 mg150 mL/hr over 60 Minutes Intravenous Every 24 hours 04/12/14 1258     04/12/14 1215  acyclovir (ZOVIRAX) tablet 400 mg  Status:  Discontinued     400 mg Oral 2 times daily 04/12/14 1202 04/13/14 1653   04/12/14 1100  vancomycin (VANCOCIN) IVPB 1000 mg/200 mL premix  Status:  Discontinued     1,000 mg200 mL/hr over 60 Minutes Intravenous  Once 04/12/14 1058 04/12/14 1258   04/12/14 1100  piperacillin-tazobactam (ZOSYN) IVPB 3.375 g  Status:  Discontinued     3.375 g12.5 mL/hr over 240 Minutes Intravenous  Once 04/12/14 1058 04/12/14 1259     Assessment: 9 yoM currently undergoing treatment for CLL presented with increasing productive cough, sinus drainage, sweats, & near syncope.   He was hypotensive with elevated WBC on admission.  No fevers noted & lactic acid level normal.  CXR + RLL PNA.  Renal function is improved.  Normalized clcr >  100ml/min.  Vancomycin 11/30>> Cefepime 11/30>>  Goal of Therapy:  Vancomycin trough level 15-20 mcg/ml  Plan:   Increase Cefepime 2gm IV q24h  Vancomycin 750mg  IV q24h  Check Vancomycin trough at steady state  Monitor renal function and cx data   Hart Robinsons A 04/14/2014,11:05 AM

## 2014-04-14 NOTE — Progress Notes (Signed)
Subjective: The patient is alert and comfortable. He remains afebrile. His admitted with a right lower lobe consolidation pneumonia and does have a history of chronic lymphocytic leukemia. He is afebrile but does have occasional cough.  Objective: Vital signs in last 24 hours: Temp:  [97.4 F (36.3 C)-98.6 F (37 C)] 98.6 F (37 C) (12/02 0449) Pulse Rate:  [59-83] 64 (12/02 0449) Resp:  [12-21] 20 (12/02 0449) BP: (108-156)/(56-85) 131/62 mmHg (12/02 0449) SpO2:  [94 %-98 %] 97 % (12/02 0449) Weight change:  Last BM Date: 04/13/14  Intake/Output from previous day: 12/01 0701 - 12/02 0700 In: -  Out: 1550 [Urine:1550] Intake/Output this shift: Total I/O In: -  Out: 300 [Urine:300]  Physical Exam: Gen. appearance patient is awake and alert  H EENT negative  Neck supple no JVD or thyroid abnormalities  Lungs clear to P&A  Heart regular rhythm no murmurs  Abdomen no palpable organs or masses  Skin warm and dry   Recent Labs  04/12/14 0920 04/13/14 0426  WBC 24.1* 20.3*  HGB 12.6* 10.6*  HCT 38.2* 31.1*  PLT 178 157   BMET  Recent Labs  04/12/14 0920 04/13/14 0426  NA 138 138  K 4.1 4.5  CL 99 106  CO2 26 22  GLUCOSE 132* 115*  BUN 35* 25*  CREATININE 1.64* 1.16  CALCIUM 8.2* 7.8*    Studies/Results: Dg Chest Portable 1 View  12020-04-1814   CLINICAL DATA:  Diaphoresis and weakness.  History of leukemia  EXAM: PORTABLE CHEST - 1 VIEW  COMPARISON:  None.  FINDINGS: There is consolidation in the right lower lobe. There is mild left base atelectasis. Heart size and pulmonary vascularity are normal. No adenopathy. Port-A-Cath tip is in the superior vena cava. No pneumothorax. No bone lesions.  IMPRESSION: Right lower lobe consolidation.  Mild atelectasis left base.   Electronically Signed   By: Lowella Grip M.D.   On: 12020-04-1814 09:43    Medications:  . acetaminophen  650 mg Oral Once  . acyclovir  400 mg Oral BID  . antiseptic oral rinse  7 mL  Mouth Rinse BID  . ceFEPime (MAXIPIME) IV  1 g Intravenous Q24H  . diphenhydrAMINE  50 mg Oral Once  . heparin  5,000 Units Subcutaneous 3 times per day  . ibrutinib  420 mg Oral Daily  . IMMUNE GLOBULIN 10% (HUMAN) IV - For Fluid Restriction Only  1 g/kg Intravenous Q24H  . insulin aspart  0-15 Units Subcutaneous TID WC  . insulin aspart  0-5 Units Subcutaneous QHS  . predniSONE  20 mg Oral Q breakfast  . rosuvastatin  20 mg Oral Daily  . sodium chloride  3 mL Intravenous Q12H  . vancomycin  750 mg Intravenous Q24H        Assessment/Plan: 1. Bronchopneumonia right lower lobe continue IV vancomycin repeat chest x-ray continue Maxipime chronic lymphocytic leukemia  2. Diabetes mellitus-continue monitor blood sugars continue short-acting insulin if needed  3. Essential hypertension currently stable  4. Chronic lymphocytic leukemia-2 obtain IVIG today and tomorrow as directed by oncology   LOS: 2 days   Demaya Hardge G 04/14/2014, 6:29 AM

## 2014-04-14 NOTE — Progress Notes (Signed)
Inpatient Diabetes Program Recommendations  AACE/ADA: New Consensus Statement on Inpatient Glycemic Control (2013)  Target Ranges:  Prepandial:   less than 140 mg/dL      Peak postprandial:   less than 180 mg/dL (1-2 hours)      Critically ill patients:  140 - 180 mg/dL   Results for MABLE, LASHLEY (MRN [id number]) as of 04/14/2014 08:19  Ref. Range 04/13/2014 07:51 04/13/2014 11:31 04/13/2014 17:28 04/13/2014 20:32 04/14/2014 07:19  Glucose-Capillary Latest Range: 70-99 mg/dL 115 (H) 225 (H) 249 (H) 293 (H) 124 (H)   Diabetes history: DM2 Outpatient Diabetes medications: Metformin 1000 mg QAM, Metformin 500 mg QPM Current orders for Inpatient glycemic control: Novolog 0-15 units AC, Novolog 0-5 units HS  Inpatient Diabetes Program Recommendations Insulin - Meal Coverage: Note post prandial glucose consistently elevated. While inpatient and ordered steroids, please consider ordering Novolog 4 units TID with meals for meal coverage.  Thanks, Barnie Alderman, RN, MSN, CCRN, CDE Diabetes Coordinator Inpatient Diabetes Program [phone] (Team Pager) [phone] ([place] office) [phone] [facility] office)

## 2014-04-14 NOTE — Progress Notes (Signed)
Subjective: Patient is seen in bed awake.  He is pleasant and denies any complaints this afternoon.  His wife and niece are at the bedside.   Objective: Vital signs in last 24 hours: Temp:  [97.7 F (36.5 C)-98.6 F (37 C)] 97.9 F (36.6 C) (12/02 1200) Pulse Rate:  [59-70] 60 (12/02 1427) Resp:  [20] 20 (12/02 1427) BP: (113-136)/(58-91) 124/63 mmHg (12/02 1427) SpO2:  [95 %-100 %] 100 % (12/02 1427)  Intake/Output from previous day: 12/01 0800 - 12/02 0759 In: -  Out: 1300 [Urine:1300] Intake/Output this shift: Total I/O In: 720 [P.O.:720] Out: 900 [Urine:900]  General appearance: alert, cooperative, appears stated age and no distress  Lab Results:   Recent Labs  04/12/14 0920 04/13/14 0426  WBC 24.1* 20.3*  HGB 12.6* 10.6*  HCT 38.2* 31.1*  PLT 178 157   BMET  Recent Labs  04/12/14 0920 04/13/14 0426  NA 138 138  K 4.1 4.5  CL 99 106  CO2 26 22  GLUCOSE 132* 115*  BUN 35* 25*  CREATININE 1.64* 1.16  CALCIUM 8.2* 7.8*    Studies/Results: Dg Chest 2 View  04/14/2014   CLINICAL DATA:  LEFT lower lobe pneumonia.  Subsequent encounter.  EXAM: CHEST  2 VIEW  COMPARISON:  1July 02, 202015.  FINDINGS: Cardiopericardial silhouette is within normal limits for projection. Aortic arch atherosclerosis. Unchanged RIGHT subclavian Port-A-Cath. RIGHT lower lobe pneumonia is present. There is also airspace disease in the posterior RIGHT middle lobe, evident on the lateral view. Blunting of the RIGHT costophrenic angle on the frontal view compatible with a small parapneumonic effusion. LEFT lung appears within normal limits.  IMPRESSION: RIGHT middle and RIGHT lower lobe pneumonia. Small RIGHT parapneumonic effusion. Minimal improvement in aeration compared to prior.   Electronically Signed   By: Dereck Ligas M.D.   On: 04/14/2014 08:09    Medications: I have reviewed the patient's current medications.  Assessment/Plan: 1. Right lower lobe consolidation on chest xray on  04/12/14, concerning for CAP.  Negative blood cultures to date (04/14/2014).  Clinically improved. 2. Leukocytosis, multifactorial with an element of reactivity from infection and an element of ibrutinib-induced leukocytosis (primarily lymphocytes). 3. Normochromic, macrocytic anemia secondary to infection and CLL. Macrocytosis is secondary to Ibrutinib 4. Dehydration, resolved 5. H/O Disseminated herpes zoster, treated with Famvir, Zoster immunoglobulin, and Acyclovir. 6. Continue Acyclovir 400 mg BID and Ibrutinib 420 mg daily as an inpatient and as an outpatient. 7. Upon discharge, recommend Augmentin 875 mg BID x 10 days with 6 ounces of yogurt daily. 8. Outpatient follow-up at [facility] scheduled for 12/10 and he is encouraged to keep this appointment. 9. Hematology will sign-off at this time and follow-up with the patient as an outpatient.  If needed, please re-consult.  Patient and plan discussed with Dr. Farrel Gobble and he is in agreement with the aforementioned.     LOS: 2 days    KEFALAS,THOMAS 04/14/2014

## 2014-04-15 LAB — LEGIONELLA ANTIGEN, URINE

## 2014-04-15 LAB — GLUCOSE, CAPILLARY
GLUCOSE-CAPILLARY: 164 mg/dL — AB (ref 70–99)
Glucose-Capillary: 242 mg/dL — ABNORMAL HIGH (ref 70–99)
Glucose-Capillary: 322 mg/dL — ABNORMAL HIGH (ref 70–99)
Glucose-Capillary: 274 mg/dL — ABNORMAL HIGH (ref 70–99)

## 2014-04-15 NOTE — Progress Notes (Signed)
Inpatient Diabetes Program Recommendations  AACE/ADA: New Consensus Statement on Inpatient Glycemic Control (2013)  Target Ranges:  Prepandial:   less than 140 mg/dL      Peak postprandial:   less than 180 mg/dL (1-2 hours)      Critically ill patients:  140 - 180 mg/dL   Results for ESSIE, GEHRET (MRN [id number]) as of 04/15/2014 14:13  Ref. Range 04/14/2014 07:19 04/14/2014 11:13 04/14/2014 16:21 04/14/2014 20:27 04/15/2014 07:20 04/15/2014 11:06  Glucose-Capillary Latest Range: 70-99 mg/dL 124 (H) 207 (H) 350 (H) 294 (H) 164 (H) 242 (H)    Diabetes history: DM2 Outpatient Diabetes medications: Metformin 1000 mg QAM, Metformin 500 mg QPM Current orders for Inpatient glycemic control: Novolog 0-15 units AC, Novolog 0-5 units HS  Inpatient Diabetes Program Recommendations Insulin - Meal Coverage: Note post prandial glucose consistently elevated. While inpatient and ordered steroids, please consider ordering Novolog 4 units TID with meals for meal coverage.  Thanks, Barnie Alderman, RN, MSN, CCRN, CDE Diabetes Coordinator Inpatient Diabetes Program [phone] (Team Pager) [phone] ([place] office) [phone] [facility] office)

## 2014-04-15 NOTE — Progress Notes (Signed)
UR chart review completed.  

## 2014-04-15 NOTE — Progress Notes (Signed)
Subjective: The patient is alert and oriented and fairly comfortable. He remains afebrile has occasional cough. His been treated for right lower lobe pneumonia and does have chronic lymphocytic leukemia  Objective: Vital signs in last 24 hours: Temp:  [97.8 F (36.6 C)-98.4 F (36.9 C)] 98.4 F (36.9 C) (12/03 0449) Pulse Rate:  [55-70] 55 (12/03 0449) Resp:  [20] 20 (12/03 0449) BP: (118-136)/(58-91) 124/58 mmHg (12/03 0449) SpO2:  [95 %-100 %] 97 % (12/03 0449) Weight change:  Last BM Date: 04/14/14  Intake/Output from previous day: 12/02 0701 - 12/03 0700 In: 960 [P.O.:960] Out: 1000 [Urine:1000] Intake/Output this shift: Total I/O In: -  Out: 100 [Urine:100]  Physical Exam: Gen. appearance patient is awake and alert and oriented  HEENT negative  Neck supple no JVD or thyroid abnormalities  Lungs clear to P&A  Heart regular rhythm no murmurs  Abdomen no palpable organs or masses  Skin warm and dry   Recent Labs  04/12/14 0920 04/13/14 0426  WBC 24.1* 20.3*  HGB 12.6* 10.6*  HCT 38.2* 31.1*  PLT 178 157   BMET  Recent Labs  04/12/14 0920 04/13/14 0426  NA 138 138  K 4.1 4.5  CL 99 106  CO2 26 22  GLUCOSE 132* 115*  BUN 35* 25*  CREATININE 1.64* 1.16  CALCIUM 8.2* 7.8*    Studies/Results: Dg Chest 2 View  04/14/2014   CLINICAL DATA:  LEFT lower lobe pneumonia.  Subsequent encounter.  EXAM: CHEST  2 VIEW  COMPARISON:  1February 23, 202015.  FINDINGS: Cardiopericardial silhouette is within normal limits for projection. Aortic arch atherosclerosis. Unchanged RIGHT subclavian Port-A-Cath. RIGHT lower lobe pneumonia is present. There is also airspace disease in the posterior RIGHT middle lobe, evident on the lateral view. Blunting of the RIGHT costophrenic angle on the frontal view compatible with a small parapneumonic effusion. LEFT lung appears within normal limits.  IMPRESSION: RIGHT middle and RIGHT lower lobe pneumonia. Small RIGHT parapneumonic effusion.  Minimal improvement in aeration compared to prior.   Electronically Signed   By: Dereck Ligas M.D.   On: 04/14/2014 08:09    Medications:  . acyclovir  400 mg Oral BID  . antiseptic oral rinse  7 mL Mouth Rinse BID  . ceFEPime (MAXIPIME) IV  2 g Intravenous Q24H  . heparin  5,000 Units Subcutaneous 3 times per day  . ibrutinib  420 mg Oral Daily  . insulin aspart  0-15 Units Subcutaneous TID WC  . insulin aspart  0-5 Units Subcutaneous QHS  . predniSONE  20 mg Oral Q breakfast  . rosuvastatin  20 mg Oral Daily  . sodium chloride  3 mL Intravenous Q12H  . vancomycin  750 mg Intravenous Q24H        Assessment/Plan: 1 bronchopneumonia right lower lobe with involvement of right middle lobe-plan to continue current antibiotic medications Maxipime and IV vancomycin will obtain pulmonary consult  2. Diabetes mellitus-continue to monitor sugars continue short-acting insulin if needed  3 essential hypertension currently stable  4. Chronic lymphocytic leukemia to continue oncology regimen  LOS: 3 days   Effie Wahlert G 04/15/2014, 6:15 AM

## 2014-04-16 LAB — CBC WITH DIFFERENTIAL/PLATELET
BASOS ABS: 0.1 10*3/uL (ref 0.0–0.1)
Basophils Relative: 0 % (ref 0–1)
EOS PCT: 0 % (ref 0–5)
Eosinophils Absolute: 0 10*3/uL (ref 0.0–0.7)
HEMATOCRIT: 30.9 % — AB (ref 39.0–52.0)
Hemoglobin: 10.7 g/dL — ABNORMAL LOW (ref 13.0–17.0)
LYMPHS PCT: 68 % — AB (ref 12–46)
Lymphs Abs: 11.8 10*3/uL — ABNORMAL HIGH (ref 0.7–4.0)
MCH: 39.1 pg — ABNORMAL HIGH (ref 26.0–34.0)
MCHC: 34.6 g/dL (ref 30.0–36.0)
MCV: 112.8 fL — AB (ref 78.0–100.0)
MONO ABS: 0.7 10*3/uL (ref 0.1–1.0)
Monocytes Relative: 4 % (ref 3–12)
Neutro Abs: 4.8 10*3/uL (ref 1.7–7.7)
Neutrophils Relative %: 28 % — ABNORMAL LOW (ref 43–77)
Platelets: 202 10*3/uL (ref 150–400)
RBC: 2.74 MIL/uL — ABNORMAL LOW (ref 4.22–5.81)
RDW: 14.5 % (ref 11.5–15.5)
WBC: 17.4 10*3/uL — AB (ref 4.0–10.5)

## 2014-04-16 LAB — BASIC METABOLIC PANEL
Anion gap: 11 (ref 5–15)
BUN: 31 mg/dL — ABNORMAL HIGH (ref 6–23)
CO2: 22 meq/L (ref 19–32)
CREATININE: 1.36 mg/dL — AB (ref 0.50–1.35)
Calcium: 8.8 mg/dL (ref 8.4–10.5)
Chloride: 103 mEq/L (ref 96–112)
GFR calc Af Amer: 57 mL/min — ABNORMAL LOW (ref >90)
GFR calc non Af Amer: 49 mL/min — ABNORMAL LOW (ref >90)
GLUCOSE: 134 mg/dL — AB (ref 70–99)
Potassium: 4.1 mEq/L (ref 3.7–5.3)
SODIUM: 136 meq/L — AB (ref 137–147)

## 2014-04-16 LAB — GLUCOSE, CAPILLARY
GLUCOSE-CAPILLARY: 297 mg/dL — AB (ref 70–99)
GLUCOSE-CAPILLARY: 253 mg/dL — AB (ref 70–99)
Glucose-Capillary: 126 mg/dL — ABNORMAL HIGH (ref 70–99)
Glucose-Capillary: 142 mg/dL — ABNORMAL HIGH (ref 70–99)

## 2014-04-16 LAB — VANCOMYCIN, TROUGH: VANCOMYCIN TR: 15.6 ug/mL (ref 10.0–20.0)

## 2014-04-16 NOTE — Progress Notes (Signed)
[place] for Vancomycin & Cefepime Indication: pneumonia  Allergies  Allergen Reactions  . Aspirin Other (See Comments)    Bleeding ulcers.   . Ibuprofen Other (See Comments)    Bleeding ulcers.   Patient Measurements: Height: 5\' 2"  (157.5 cm) Weight: 135 lb 9.3 oz (61.5 kg) IBW/kg (Calculated) : 54.6  Vital Signs: Temp: 98.5 F (36.9 C) (12/04 0518) Temp Source: Oral (12/04 0518) BP: 132/68 mmHg (12/04 0518) Pulse Rate: 64 (12/04 0518) Intake/Output from previous day: 12/03 0701 - 12/04 0700 In: 720 [P.O.:720] Out: 350 [Urine:350] Intake/Output from this shift: Total I/O In: 240 [P.O.:240] Out: 400 [Urine:400]  Labs:  Recent Labs  04/16/14 0541  WBC 17.4*  HGB 10.7*  PLT 202  CREATININE 1.36*   Estimated Creatinine Clearance: 35.7 mL/min (by C-G formula based on Cr of 1.36).  Recent Labs  04/16/14 1156  [place] 15.6    Microbiology: Recent Results (from the past 720 hour(s))  Blood culture (routine x 2)     Status: None (Preliminary result)   Collection Time: 04/12/14 11:08 AM  Result Value Ref Range Status   Specimen Description BLOOD LEFT ARM  Final   Special Requests   Final    BOTTLES DRAWN AEROBIC AND ANAEROBIC 4CC  IMMUNE:COMPROMISED   Culture NO GROWTH 3 DAYS  Final   Report Status PENDING  Incomplete  Blood culture (routine x 2)     Status: None (Preliminary result)   Collection Time: 04/12/14 11:28 AM  Result Value Ref Range Status   Specimen Description BLOOD LEFT HAND  Final   Special Requests   Final    BOTTLES DRAWN AEROBIC AND ANAEROBIC 5CC IMMUNE:COMPROMISED   Culture NO GROWTH 3 DAYS  Final   Report Status PENDING  Incomplete  MRSA PCR Screening     Status: None   Collection Time: 04/12/14  1:30 PM  Result Value Ref Range Status   MRSA by PCR NEGATIVE NEGATIVE Final    Comment:        The GeneXpert MRSA Assay (FDA approved for NASAL specimens only), is one component of a comprehensive MRSA  colonization surveillance program. It is not intended to diagnose MRSA infection nor to guide or monitor treatment for MRSA infections.     Anti-infectives    Start     Dose/Rate Route Frequency Ordered Stop   04/14/14 1600  ceFEPIme (MAXIPIME) 2 g in dextrose 5 % 50 mL IVPB     2 g100 mL/hr over 30 Minutes Intravenous Every 24 hours 04/14/14 1105 04/20/14 1559   04/13/14 2200  acyclovir (ZOVIRAX) 200 MG capsule 400 mg     400 mg Oral 2 times daily 04/13/14 1653     04/12/14 1600  ceFEPIme (MAXIPIME) 1 g in dextrose 5 % 50 mL IVPB  Status:  Discontinued     1 g100 mL/hr over 30 Minutes Intravenous Every 24 hours 04/12/14 1301 04/14/14 1105   04/12/14 1400  ceFEPIme (MAXIPIME) 1 g in dextrose 5 % 50 mL IVPB  Status:  Discontinued     1 g100 mL/hr over 30 Minutes Intravenous 3 times per day 04/12/14 1155 04/12/14 1300   04/12/14 1300  vancomycin (VANCOCIN) IVPB 750 mg/150 ml premix     750 mg150 mL/hr over 60 Minutes Intravenous Every 24 hours 04/12/14 1258     04/12/14 1215  acyclovir (ZOVIRAX) tablet 400 mg  Status:  Discontinued     400 mg Oral 2 times daily 04/12/14 1202 04/13/14 1653  04/12/14 1100  vancomycin (VANCOCIN) IVPB 1000 mg/200 mL premix  Status:  Discontinued     1,000 mg200 mL/hr over 60 Minutes Intravenous  Once 04/12/14 1058 04/12/14 1258   04/12/14 1100  piperacillin-tazobactam (ZOSYN) IVPB 3.375 g  Status:  Discontinued     3.375 g12.5 mL/hr over 240 Minutes Intravenous  Once 04/12/14 1058 04/12/14 1259     Assessment: 40 yoM currently undergoing treatment for CLL presented with increasing productive cough, sinus drainage, sweats, & near syncope.   He was hypotensive with elevated WBC on admission.  No fevers noted & lactic acid level normal.  CXR + RLL PNA.  Renal function is improved.  Trough level is on target.    Vancomycin 11/30>> Cefepime 11/30>>  Goal of Therapy:  Vancomycin trough level 15-20 mcg/ml  Plan:   Continue Cefepime 2gm IV  q24h  Continue Vancomycin 750mg  IV q24h  Check Vancomycin trough level weekly or sooner if indicated  Monitor renal function and cx data   Hart Robinsons A 04/16/2014,12:46 PM

## 2014-04-16 NOTE — Progress Notes (Signed)
Per MD pt should be medically stable for discharge 04/17/14. Pt is aware. No CM/DME needs noted. IM given by Vladimir Creeks, RN CM.

## 2014-04-16 NOTE — Progress Notes (Signed)
Inpatient Diabetes Program Recommendations  AACE/ADA: New Consensus Statement on Inpatient Glycemic Control (2013)  Target Ranges:  Prepandial:   less than 140 mg/dL      Peak postprandial:   less than 180 mg/dL (1-2 hours)      Critically ill patients:  140 - 180 mg/dL   Results for MACALLAN, ORD (MRN [id number]) as of 04/16/2014 09:18  Ref. Range 04/15/2014 07:20 04/15/2014 11:06 04/15/2014 16:31 04/15/2014 21:36 04/16/2014 07:21  Glucose-Capillary Latest Range: 70-99 mg/dL 164 (H) 242 (H) 322 (H) 274 (H) 126 (H)    Diabetes history: DM2 Outpatient Diabetes medications: Metformin 1000 mg QAM, Metformin 500 mg QPM Current orders for Inpatient glycemic control: Novolog 0-15 units AC, Novolog 0-5 units HS  Inpatient Diabetes Program Recommendations Insulin - Meal Coverage: Note post prandial glucose consistently elevated. While inpatient and ordered steroids, please consider ordering Novolog 4 units TID with meals for meal coverage.  Thanks, Barnie Alderman, RN, MSN, CCRN, CDE Diabetes Coordinator Inpatient Diabetes Program [phone] (Team Pager) [phone] ([place] office) [phone] [facility] office)

## 2014-04-16 NOTE — Progress Notes (Signed)
Subjective: The patient had a fairly comfortable night he remains afebrile febrile and is being treated for right lower lobe pneumonia. He does have chronic lymphocytic leukemia  Objective: Vital signs in last 24 hours: Temp:  [98 F (36.7 C)-98.5 F (36.9 C)] 98.5 F (36.9 C) (12/04 0518) Pulse Rate:  [62-65] 64 (12/04 0518) Resp:  [20] 20 (12/04 0518) BP: (122-159)/(68-70) 132/68 mmHg (12/04 0518) SpO2:  [95 %-100 %] 95 % (12/04 0518) Weight change:  Last BM Date: 04/15/14  Intake/Output from previous day: 12/03 0701 - 12/04 0700 In: 720 [P.O.:720] Out: 350 [Urine:350] Intake/Output this shift: Total I/O In: 240 [P.O.:240] Out: 350 [Urine:350]  Physical Exam: Gen. appearance the patient is awake and oriented  HEENT negative  Neck supple no JVD or thyroid abnormalities  Lungs clear to P&A  Heart regular rhythm no murmurs  Abdomen no palpable organs or masses  Skin warm and dry  No results for input(s): WBC, HGB, HCT, PLT in the last 72 hours. BMET No results for input(s): NA, K, CL, CO2, GLUCOSE, BUN, CREATININE, CALCIUM in the last 72 hours.  Studies/Results: Dg Chest 2 View  04/14/2014   CLINICAL DATA:  LEFT lower lobe pneumonia.  Subsequent encounter.  EXAM: CHEST  2 VIEW  COMPARISON:  110-06-2013.  FINDINGS: Cardiopericardial silhouette is within normal limits for projection. Aortic arch atherosclerosis. Unchanged RIGHT subclavian Port-A-Cath. RIGHT lower lobe pneumonia is present. There is also airspace disease in the posterior RIGHT middle lobe, evident on the lateral view. Blunting of the RIGHT costophrenic angle on the frontal view compatible with a small parapneumonic effusion. LEFT lung appears within normal limits.  IMPRESSION: RIGHT middle and RIGHT lower lobe pneumonia. Small RIGHT parapneumonic effusion. Minimal improvement in aeration compared to prior.   Electronically Signed   By: Dereck Ligas M.D.   On: 04/14/2014 08:09    Medications:  .  acyclovir  400 mg Oral BID  . antiseptic oral rinse  7 mL Mouth Rinse BID  . ceFEPime (MAXIPIME) IV  2 [place] Intravenous Q24H  . heparin  5,000 Units Subcutaneous 3 times per day  . ibrutinib  420 mg Oral Daily  . insulin aspart  0-15 Units Subcutaneous TID WC  . insulin aspart  0-5 Units Subcutaneous QHS  . predniSONE  20 mg Oral Q breakfast  . rosuvastatin  20 mg Oral Daily  . sodium chloride  3 mL Intravenous Q12H  . vancomycin  750 mg Intravenous Q24H        Assessment/Plan: 1. Bronchopneumonia right lower lobe and probably right middle lobe-plan to continue current antibiotic medications Maxipime and vancomycin  2. Diabetes mellitus-continue to monitor sugars continue short-acting insulin  3. Essential hypertension currently stable continue current meds  For chronic lymphocytic leukemia-continue with oncology regimen   LOS: 4 days   Randall Reyes 04/16/2014, 6:16 AM

## 2014-04-17 LAB — CULTURE, BLOOD (ROUTINE X 2)
CULTURE: NO GROWTH
Culture: NO GROWTH

## 2014-04-17 LAB — GLUCOSE, CAPILLARY: GLUCOSE-CAPILLARY: 132 mg/dL — AB (ref 70–99)

## 2014-04-17 NOTE — Progress Notes (Signed)
Pt discharged home today per Dr. Everette Rank. Pt's IV site D/C'd and WDL. Pt's VSS. Pt and pt's wife provided with home medication list, discharge instructions and prescriptions. Verbalized understanding. Also returned patient's chemotherapy medication from pharmacy to wife. Pt left floor via WC in stable condition accompanied by NT.

## 2014-04-17 NOTE — Plan of Care (Signed)
Problem: Phase II Progression Outcomes Goal: Discharge plan established Outcome: Completed/Met Date Met:  04/17/14

## 2014-04-17 NOTE — Plan of Care (Signed)
Problem: Phase II Progression Outcomes Goal: Progress activity as tolerated unless otherwise ordered Outcome: Completed/Met Date Met:  04/17/14     

## 2014-04-17 NOTE — Discharge Summary (Signed)
Physician Discharge Summary  Randall Reyes:[id number] DOB: 01-06-1936 DOA: 127-Nov-202015  PCP: [name], MD  Admit date: 127-Nov-202015 Discharge date: 04/17/2014   Follow-up Information    Follow up with [facility] On 04/22/2014.   Contact information:   [address] [place] [address]       Discharge Diagnoses:  1. Bronchopneumonia right lower lobe-sepsis 2. Chronic lymphocytic leukemia 3. Non-insulin-dependent diabetes 4. Essential hypertension with initial hypotension 5.   Discharge Condition: Stable Disposition: Home  Diet recommendation: [address] ADA diet  Filed Weights   04/12/14 0918 04/13/14 0500  Weight: 61.236 kg (135 lb) 61.5 kg (135 lb 9.3 oz)    History of present illness:  The patient was admitted through the ED with near syncope. Increasing cough for several days prior to his admission in emergency department it was noted he had the right lower lobe consolidation with a white blood count 24,000. He does have a history of chronic lymphocytic leukemia. IV fluids were started and he was started on vancomycin and Zosyn and subsequently was admitted  Hospital Course:  As stated the patient was started on IV vancomycin and Zosyn. This was given on a daily basis his IV fluids were continued throughout his hospital stay and he was continued on medications listed below. He is a diabetic and his blood sugars remained fairly stable his initial blood pressure was low and it stabilizes well. He was seen in consultation by oncology service in regards to his chronic lymphocytic leukemia his right lower lobe consolidation persisted but clinically he improved. He was instructed to continue acyclovir 400 mg twice a day as outpatient and in hematology medication he also was instructed to follow-up with [facility] and was encouraged to keep this appointment. The patient stabilized enough to be  discharged   Discharge Instructions The patient was instructed to continue medication list did below and was given prescription for Levaquin 750 mg daily to continue. He is to follow up with primary care physician by appointment in to follow up with hematologist by appointment    Medication List    TAKE these medications        acetaminophen 325 MG tablet  Commonly known as:  TYLENOL  Take 650 mg by mouth every 6 (six) hours as needed. Pain.     acyclovir 400 MG tablet  Commonly known as:  ZOVIRAX  Take 1 tablet (400 mg total) by mouth 2 (two) times daily.     fluconazole 200 MG tablet  Commonly known as:  DIFLUCAN  Take 1 tablet (200 mg total) by mouth daily.     HYDROcodone-acetaminophen 5-325 MG per tablet  Commonly known as:  NORCO  Take 1 tablet by mouth every 6 (six) hours as needed for moderate pain.     ibrutinib 140 MG capsul  Commonly known as:  IMBRUVICA  Take 3 capsules (420 mg total) by mouth daily.     lisinopril 5 MG tablet  Commonly known as:  PRINIVIL,ZESTRIL  Take 2.5 mg by mouth daily.     metFORMIN 500 MG tablet  Commonly known as:  GLUCOPHAGE  Take 500-1,000 mg by mouth 2 (two) times daily with a meal. 2 tablets in the morning and 1 tablet in the evening.     predniSONE 20 MG tablet  Commonly known as:  DELTASONE  Take 1 tablet daily or as directed     rosuvastatin 20 MG tablet  Commonly known as:  CRESTOR  Take 20 mg by mouth daily.     STOOL SOFTENER PO  Take 1 capsule by mouth as needed (constipation).     triamterene-hydrochlorothiazide 37.5-25 MG per capsule  Commonly known as:  DYAZIDE  Take 1 capsule by mouth daily.     trifluridine 1 % ophthalmic solution  Commonly known as:  VIROPTIC  1 drop in each eye every 2 hours while awake.       Allergies  Allergen Reactions  . Aspirin Other (See Comments)    Bleeding ulcers.   . Ibuprofen Other (See Comments)    Bleeding ulcers.    The results of significant diagnostics from  this hospitalization (including imaging, microbiology, ancillary and laboratory) are listed below for reference.    Significant Diagnostic Studies: Dg Chest 2 View  04/14/2014   CLINICAL DATA:  LEFT lower lobe pneumonia.  Subsequent encounter.  EXAM: CHEST  2 VIEW  COMPARISON:  1Feb 07, 202015.  FINDINGS: Cardiopericardial silhouette is within normal limits for projection. Aortic arch atherosclerosis. Unchanged RIGHT subclavian Port-A-Cath. RIGHT lower lobe pneumonia is present. There is also airspace disease in the posterior RIGHT middle lobe, evident on the lateral view. Blunting of the RIGHT costophrenic angle on the frontal view compatible with a small parapneumonic effusion. LEFT lung appears within normal limits.  IMPRESSION: RIGHT middle and RIGHT lower lobe pneumonia. Small RIGHT parapneumonic effusion. Minimal improvement in aeration compared to prior.   Electronically Signed   By: Dereck Ligas M.D.   On: 04/14/2014 08:09   Dg Chest Portable 1 View  1Feb 07, 202015   CLINICAL DATA:  Diaphoresis and weakness.  History of leukemia  EXAM: PORTABLE CHEST - 1 VIEW  COMPARISON:  None.  FINDINGS: There is consolidation in the right lower lobe. There is mild left base atelectasis. Heart size and pulmonary vascularity are normal. No adenopathy. Port-A-Cath tip is in the superior vena cava. No pneumothorax. No bone lesions.  IMPRESSION: Right lower lobe consolidation.  Mild atelectasis left base.   Electronically Signed   By: Lowella Grip M.D.   On: 1Feb 07, 202015 09:43    Microbiology: Recent Results (from the past 240 hour(s))  Blood culture (routine x 2)     Status: None   Collection Time: 04/12/14 11:08 AM  Result Value Ref Range Status   Specimen Description BLOOD LEFT ARM  Final   Special Requests   Final    BOTTLES DRAWN AEROBIC AND ANAEROBIC 4CC  IMMUNE:COMPROMISED   Culture NO GROWTH 5 DAYS  Final   Report Status 04/17/2014 FINAL  Final  Blood culture (routine x 2)     Status: None    Collection Time: 04/12/14 11:28 AM  Result Value Ref Range Status   Specimen Description BLOOD LEFT HAND  Final   Special Requests   Final    BOTTLES DRAWN AEROBIC AND ANAEROBIC 5CC IMMUNE:COMPROMISED   Culture NO GROWTH 5 DAYS  Final   Report Status 04/17/2014 FINAL  Final  MRSA PCR Screening     Status: None   Collection Time: 04/12/14  1:30 PM  Result Value Ref Range Status   MRSA by PCR NEGATIVE NEGATIVE Final    Comment:        The GeneXpert MRSA Assay (FDA approved for NASAL specimens only), is one component of a comprehensive MRSA colonization surveillance program. It is not intended to diagnose MRSA infection nor to guide or monitor treatment for MRSA infections.      Labs: Basic Metabolic Panel:  Recent Labs Lab 04/12/14 0920  04/13/14 0426 04/16/14 0541  NA 138 138 136*  K 4.1 4.5 4.1  CL 99 106 103  CO2 26 22 22   GLUCOSE 132* 115* 134*  BUN 35* 25* 31*  CREATININE 1.64* 1.16 1.36*  CALCIUM 8.2* 7.8* 8.8   Liver Function Tests:  Recent Labs Lab 04/12/14 0920 04/13/14 0426  AST 23 21  ALT 21 19  ALKPHOS 50 44  BILITOT 0.5 0.5  PROT 6.0 5.5*  ALBUMIN 2.7* 2.3*   No results for input(s): LIPASE, AMYLASE in the last 168 hours. No results for input(s): AMMONIA in the last 168 hours. CBC:  Recent Labs Lab 04/12/14 0920 04/13/14 0426 04/16/14 0541  WBC 24.1* 20.3* 17.4*  NEUTROABS 12.0*  --  4.8  HGB 12.6* 10.6* 10.7*  HCT 38.2* 31.1* 30.9*  MCV 118.3* 116.5* 112.8*  PLT 178 157 202   Cardiac Enzymes:  Recent Labs Lab 04/12/14 0920  TROPONINI <0.30   BNP: BNP (last 3 results) No results for input(s): PROBNP in the last 8760 hours. CBG:  Recent Labs Lab 04/15/14 2136 04/16/14 0721 04/16/14 1108 04/16/14 1639 04/16/14 2102  GLUCAP 274* 126* 297* 253* 142*    Principal Problem:   Sepsis due to pneumonia Active Problems:   CLL (chronic lymphocytic leukemia)   DM (diabetes mellitus)   Pneumonia   Leukocytosis   Time  coordinating discharge: 45 minutes  Signed:  Marjean Donna, MD 04/17/2014, 6:38 AM

## 2014-04-19 NOTE — Progress Notes (Signed)
UR chart review completed.  

## 2014-04-22 ENCOUNTER — Encounter (HOSPITAL_COMMUNITY): Payer: Self-pay

## 2014-04-22 ENCOUNTER — Encounter (HOSPITAL_COMMUNITY): Payer: Medicare Other

## 2014-04-22 ENCOUNTER — Encounter (HOSPITAL_COMMUNITY): Payer: Medicare Other | Attending: Oncology

## 2014-04-22 VITALS — BP 102/49 | HR 102 | Temp 97.3°F | Resp 18 | Wt 130.8 lb

## 2014-04-22 DIAGNOSIS — C9110 Chronic lymphocytic leukemia of B-cell type not having achieved remission: Secondary | ICD-10-CM | POA: Diagnosis present

## 2014-04-22 DIAGNOSIS — M310 Hypersensitivity angiitis: Secondary | ICD-10-CM | POA: Diagnosis present

## 2014-04-22 DIAGNOSIS — I776 Arteritis, unspecified: Secondary | ICD-10-CM

## 2014-04-22 DIAGNOSIS — B029 Zoster without complications: Secondary | ICD-10-CM

## 2014-04-22 LAB — CBC WITH DIFFERENTIAL/PLATELET
BASOS ABS: 0.1 10*3/uL (ref 0.0–0.1)
BASOS PCT: 0 % (ref 0–1)
Eosinophils Absolute: 0 10*3/uL (ref 0.0–0.7)
Eosinophils Relative: 0 % (ref 0–5)
HCT: 33.3 % — ABNORMAL LOW (ref 39.0–52.0)
Hemoglobin: 11.7 g/dL — ABNORMAL LOW (ref 13.0–17.0)
LYMPHS PCT: 58 % — AB (ref 12–46)
Lymphs Abs: 15.2 10*3/uL — ABNORMAL HIGH (ref 0.7–4.0)
MCH: 39.7 pg — ABNORMAL HIGH (ref 26.0–34.0)
MCHC: 35.1 g/dL (ref 30.0–36.0)
MCV: 112.9 fL — ABNORMAL HIGH (ref 78.0–100.0)
Monocytes Absolute: 0.3 10*3/uL (ref 0.1–1.0)
Monocytes Relative: 1 % — ABNORMAL LOW (ref 3–12)
NEUTROS ABS: 10.8 10*3/uL — AB (ref 1.7–7.7)
Neutrophils Relative %: 41 % — ABNORMAL LOW (ref 43–77)
PLATELETS: ADEQUATE 10*3/uL (ref 150–400)
RBC: 2.95 MIL/uL — ABNORMAL LOW (ref 4.22–5.81)
RDW: 15.2 % (ref 11.5–15.5)
WBC: 26.3 10*3/uL — ABNORMAL HIGH (ref 4.0–10.5)

## 2014-04-22 LAB — RETICULOCYTES
RBC.: 2.95 MIL/uL — AB (ref 4.22–5.81)
RETIC COUNT ABSOLUTE: 100.3 10*3/uL (ref 19.0–186.0)
Retic Ct Pct: 3.4 % — ABNORMAL HIGH (ref 0.4–3.1)

## 2014-04-22 NOTE — Patient Instructions (Signed)
[facility] Discharge Instructions  RECOMMENDATIONS MADE BY THE CONSULTANT AND ANY TEST RESULTS WILL BE SENT TO YOUR REFERRING PHYSICIAN.  EXAM FINDINGS BY THE PHYSICIAN TODAY AND SIGNS OR SYMPTOMS TO REPORT TO CLINIC OR PRIMARY PHYSICIAN: Exam and findings as discussed by Dr.Formanek.  MEDICATIONS PRESCRIBED:  Continue as prescribed.  INSTRUCTIONS/FOLLOW-UP: Lab work today. Return to clinic in 1 month for lab work and MD appointment.  Thank you for choosing [facility] to provide your oncology and hematology care.  To afford each patient quality time with our providers, please arrive at least 15 minutes before your scheduled appointment time.  With your help, our goal is to use those 15 minutes to complete the necessary work-up to ensure our physicians have the information they need to help with your evaluation and healthcare recommendations.    Effective January 1st, 2014, we ask that you re-schedule your appointment with our physicians should you arrive 10 or more minutes late for your appointment.  We strive to give you quality time with our providers, and arriving late affects you and other patients whose appointments are after yours.    Again, thank you for choosing [facility].  Our hope is that these requests will decrease the amount of time that you wait before being seen by our physicians.       _____________________________________________________________  Should you have questions after your visit to [facility], please contact our office at [phone] between the hours of 8:30 a.m. and 4:30 p.m.  Voicemails left after 4:30 p.m. will not be returned until the following business day.  For prescription refill requests, have your pharmacy contact our office with your prescription refill request.    _______________________________________________________________  We hope that we have given you very good care.  You may  receive a patient satisfaction survey in the mail, please complete it and return it as soon as possible.  We value your feedback!  _______________________________________________________________  Have you asked about our STAR program?  STAR stands for Survivorship Training and Rehabilitation, and this is a nationally recognized cancer care program that focuses on survivorship and rehabilitation.  Cancer and cancer treatments may cause problems, such as, pain, making you feel tired and keeping you from doing the things that you need or want to do. Cancer rehabilitation can help. Our goal is to reduce these troubling effects and help you have the best quality of life possible.  You may receive a survey from a nurse that asks questions about your current state of health.  Based on the survey results, all eligible patients will be referred to the [facility] program for an evaluation so we can better serve you!  A frequently asked questions sheet is available upon request.

## 2014-04-22 NOTE — Progress Notes (Signed)
[facility]  OFFICE PROGRESS NOTE  [name], MD [address] [place] [address]  DIAGNOSIS: CLL (chronic lymphocytic leukemia) - Plan: CBC with Differential  Leukocytoclastic vasculitis  Herpes zoster  Chief Complaint  Patient presents with  . Chronic lymphocytic leukemia    Ibrutinib  . Bronchopneumonia    Vancomycin/Zosyn as inpatient, Levaquin on discharge    CURRENT THERAPY: Ibrutinib 420 mg daily, acyclovir 400 mg twice a day, prednisone 20 mg daily, Levaquin 750 mg daily.  INTERVAL HISTORY: Randall Reyes 78 y.o. male returns for followup of chronic lymphocytic leukemia and leukocytoclastic vasculitis with recent episode of disseminated herpes zoster, now on ibrutinib 420 mg daily. Disseminated zoster occurred while taking idelalisib. 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 He was recently hospitalized on 105-10-2013 and discharged on 04/17/2014 for right lower lobe bronchopneumonia treated with vancomycin and Zosyn along with IV IgG total 2 g/m over 2 days.[name] He was maintained on acyclovir as well as ibrutinib during this hospitalization. He was discharged home on Levaquin 750 mg daily. He denies any sore mouth, sore throat, dysphagia, cough, wheezing, shortness of breath, PND, orthopnea, palpitations, abdominal pain, diarrhea, constipation, melena, hematochezia, hematuria, easy satiety, lower extremity swelling or redness but still with minimal ecchymoses involving both upper extremities and abdominal wall where he received Lovenox as an inpatient.  MEDICAL HISTORY: Past Medical History  Diagnosis Date  . Diabetes mellitus   . Leukemia 6.22.2012    PER PATIENT  . Hypertension   . Ulcer   . Anemia   . CLL (chronic lymphocytic leukemia) 01/24/2011  . Melanoma in situ 01/24/2011  . DM (diabetes mellitus) 01/24/2011  . Mild obesity 01/24/2011  . Eczema 01/24/2011    . Port catheter in place 05/02/2012  . Shingles     INTERIM HISTORY: has CLL (chronic lymphocytic leukemia); Melanoma in situ; DM (diabetes mellitus); Mild obesity; Eczema; Port catheter in place; Pneumonia; Sepsis due to pneumonia; and Leukocytosis on his problem list.    ALLERGIES:  is allergic to aspirin and ibuprofen.  MEDICATIONS: has a current medication list which includes the following prescription(s): acetaminophen, acyclovir, docusate calcium, hydrocodone-acetaminophen, ibrutinib, lisinopril, metformin, prednisone, rosuvastatin, triamterene-hydrochlorothiazide, and trifluridine.  SURGICAL HISTORY:  Past Surgical History  Procedure Laterality Date  . Hernia repair  2001  . Bleeding ulcer    . Portacath placement  2008  . Cataract extraction w/phaco  05/05/2012    Procedure: CATARACT EXTRACTION PHACO AND INTRAOCULAR LENS PLACEMENT (IOC);  Surgeon: Tonny Branch, MD;  Location: [facility];  Service: Ophthalmology;  Laterality: Right;  CDE:13.25  . Cataract extraction w/phaco  05/15/2012    Procedure: CATARACT EXTRACTION PHACO AND INTRAOCULAR LENS PLACEMENT (IOC);  Surgeon: Tonny Branch, MD;  Location: [facility];  Service: Ophthalmology;  Laterality: Left;  CDE:  12.32    FAMILY HISTORY: family history includes Diabetes in his father.  SOCIAL HISTORY:  reports that he has quit smoking. He quit smokeless tobacco use about 47 years ago. He reports that he does not drink alcohol or use illicit drugs.  REVIEW OF SYSTEMS:  Other than that discussed above is noncontributory.  PHYSICAL EXAMINATION: ECOG PERFORMANCE STATUS: 1 - Symptomatic but completely ambulatory  Blood pressure 102/49, pulse 102, temperature 97.3 F (36.3 C), temperature source Oral, resp. rate 18, weight 130 lb 12.8 oz (59.33 kg), peak flow 97 L/min.  GENERAL:alert, no distress and comfortable SKIN: skin color, texture,  turgor are normal, no rashes or significant lesions. Small ecchymoses on both upper extremities. EYES:  PERLA; Conjunctiva are pink and non-injected, sclera clear SINUSES: No redness or tenderness over maxillary or ethmoid sinuses OROPHARYNX:no exudate, no erythema on lips, buccal mucosa, or tongue. NECK: supple, thyroid normal size, non-tender, without nodularity. No masses CHEST: LifePort in place. Increased AP diameter. No gynecomastia LYMPH:  no palpable lymphadenopathy in the cervical, axillary or inguinal LUNGS: clear to auscultation and percussion with normal breathing effort HEART: regular rate & rhythm and no murmurs. ABDOMEN:abdomen soft, non-tender and normal bowel sounds. Soft with no organomegaly, ascites, or CVA tenderness. Abdominal wall with ecchymoses at the site of Lovenox injections. MUSCULOSKELETAL:no cyanosis of digits and no clubbing. Range of motion normal.  NEURO: alert & oriented x 3 with fluent speech, no focal motor/sensory deficits. Minimal hearing deficit. LABORATORY DATA:  Results for QUENTION, MCNEILL (MRN [id number]) as of 04/22/2014 14:36  Ref. Range 03/02/2014 12:30 03/25/2014 13:46 107-30-2015 09:20 04/13/2014 04:26 04/16/2014 05:41  WBC Latest Range: 4.0-10.5 K/uL 156.6 (HH) 38.6 (H) 24.1 (H) 20.3 (H) 17.4 (H)     Appointment on 04/22/2014  Component Date Value Ref Range Status  . WBC 04/22/2014 26.3* 4.0 - 10.5 K/uL Final  . RBC 04/22/2014 2.95* 4.22 - 5.81 MIL/uL Final  . Hemoglobin 04/22/2014 11.7* 13.0 - 17.0 g/dL Final  . HCT 04/22/2014 33.3* 39.0 - 52.0 % Final  . MCV 04/22/2014 112.9* 78.0 - 100.0 fL Final  . MCH 04/22/2014 39.7* 26.0 - 34.0 pg Final  . MCHC 04/22/2014 35.1  30.0 - 36.0 g/dL Final  . RDW 04/22/2014 15.2  11.5 - 15.5 % Final  . Platelets 04/22/2014 PLATELET CLUMPS NOTED ON SMEAR, COUNT APPEARS ADEQUATE  150 - 400 K/uL Final  . Neutrophils Relative % 04/22/2014 41* 43 - 77 % Final  . Neutro Abs 04/22/2014 10.8* 1.7 - 7.7 K/uL Final  . Lymphocytes Relative 04/22/2014 58* 12 - 46 % Final  . Lymphs Abs 04/22/2014 15.2* 0.7 - 4.0  K/uL Final  . Monocytes Relative 04/22/2014 1* 3 - 12 % Final  . Monocytes Absolute 04/22/2014 0.3  0.1 - 1.0 K/uL Final  . Eosinophils Relative 04/22/2014 0  0 - 5 % Final  . Eosinophils Absolute 04/22/2014 0.0  0.0 - 0.7 K/uL Final  . Basophils Relative 04/22/2014 0  0 - 1 % Final  . Basophils Absolute 04/22/2014 0.1  0.0 - 0.1 K/uL Final  . WBC Morphology 04/22/2014 TOXIC GRANULATION   Final   Comment: SMUDGE CELLS ABSOLUTE LYMPHOCYTOSIS ATYPICAL LYMPHOCYTES MILD LEFT SHIFT (1-5% METAS, OCC MYELO, OCC BANDS)   . Retic Ct Pct 04/22/2014 3.4* 0.4 - 3.1 % Final  . RBC. 04/22/2014 2.95* 4.22 - 5.81 MIL/uL Final  . Retic Count, Manual 04/22/2014 100.3  19.0 - 186.0 K/uL Final  Admission on 107-30-2015, Discharged on 04/17/2014  Component Date Value Ref Range Status  . WBC 107-30-2015 24.1* 4.0 - 10.5 K/uL Final   WHITE COUNT CONFIRMED ON SMEAR  . RBC 107-30-2015 3.23* 4.22 - 5.81 MIL/uL Final  . Hemoglobin 107-30-2015 12.6* 13.0 - 17.0 g/dL Final  . HCT 107-30-2015 38.2* 39.0 - 52.0 % Final  . MCV 107-30-2015 118.3* 78.0 - 100.0 fL Final  . MCH 107-30-2015 39.0* 26.0 - 34.0 pg Final  . MCHC 107-30-2015 33.0  30.0 - 36.0 g/dL Final  . RDW 107-30-2015 13.6  11.5 - 15.5 % Final  . Platelets 107-30-2015 178  150 - 400 K/uL Final  . Neutrophils  Relative % 101/15/202015 50  43 - 77 % Final  . Neutro Abs 101/15/202015 12.0* 1.7 - 7.7 K/uL Final  . Lymphocytes Relative 101/15/202015 44  12 - 46 % Final  . Lymphs Abs 101/15/202015 10.5* 0.7 - 4.0 K/uL Final  . Monocytes Relative 101/15/202015 7  3 - 12 % Final  . Monocytes Absolute 101/15/202015 1.6* 0.1 - 1.0 K/uL Final  . Eosinophils Relative 101/15/202015 0  0 - 5 % Final  . Eosinophils Absolute 101/15/202015 0.0  0.0 - 0.7 K/uL Final  . Basophils Relative 101/15/202015 0  0 - 1 % Final  . Basophils Absolute 101/15/202015 0.1  0.0 - 0.1 K/uL Final  . WBC Morphology 101/15/202015 ATYPICAL LYMPHOCYTES   Final   INCREASED BANDS (>20% BANDS)  . Sodium 101/15/202015 138  137 - 147  mEq/L Final  . Potassium 101/15/202015 4.1  3.7 - 5.3 mEq/L Final  . Chloride 101/15/202015 99  96 - 112 mEq/L Final  . CO2 101/15/202015 26  19 - 32 mEq/L Final  . Glucose, Bld 101/15/202015 132* 70 - 99 mg/dL Final  . BUN 101/15/202015 35* 6 - 23 mg/dL Final  . Creatinine, Ser 101/15/202015 1.64* 0.50 - 1.35 mg/dL Final  . Calcium 101/15/202015 8.2* 8.4 - 10.5 mg/dL Final  . GFR calc non Af Amer 101/15/202015 39* >90 mL/min Final  . GFR calc Af Amer 101/15/202015 45* >90 mL/min Final   Comment: (NOTE) The eGFR has been calculated using the CKD EPI equation. This calculation has not been validated in all clinical situations. eGFR's persistently <90 mL/min signify possible Chronic Kidney Disease.   . Anion gap 101/15/202015 13  5 - 15 Final  . Total Protein 101/15/202015 6.0  6.0 - 8.3 g/dL Final  . Albumin 101/15/202015 2.7* 3.5 - 5.2 g/dL Final  . AST 101/15/202015 23  0 - 37 U/L Final  . ALT 101/15/202015 21  0 - 53 U/L Final  . Alkaline Phosphatase 101/15/202015 50  39 - 117 U/L Final  . Total Bilirubin 101/15/202015 0.5  0.3 - 1.2 mg/dL Final  . Bilirubin, Direct 101/15/202015 <0.2  0.0 - 0.3 mg/dL Final  . Indirect Bilirubin 101/15/202015 NOT CALCULATED  0.3 - 0.9 mg/dL Final  . Troponin I 101/15/202015 <0.30  <0.30 ng/mL Final   Comment:        Due to the release kinetics of cTnI, a negative result within the first hours of the onset of symptoms does not rule out myocardial infarction with certainty. If myocardial infarction is still suspected, repeat the test at appropriate intervals.   . Fecal Occult Bld 101/15/202015 POSITIVE* NEGATIVE Final  . Lactic Acid, Venous 101/15/202015 1.5  0.5 - 2.2 mmol/L Final  . Specimen Description 101/15/202015 BLOOD LEFT ARM   Final  . Special Requests 101/15/202015 BOTTLES DRAWN AEROBIC AND ANAEROBIC 4CC  IMMUNE:COMPROMISED   Final  . Culture 101/15/202015 NO GROWTH 5 DAYS   Final  . Report Status 101/15/202015 04/17/2014 FINAL   Final  . Specimen Description 101/15/202015 BLOOD LEFT HAND   Final  . Special  Requests 101/15/202015 BOTTLES DRAWN AEROBIC AND ANAEROBIC 5CC IMMUNE:COMPROMISED   Final  . Culture 101/15/202015 NO GROWTH 5 DAYS   Final  . Report Status 101/15/202015 04/17/2014 FINAL   Final  . HIV 1&2 Ab, 4th Generation 101/15/202015 NONREACTIVE  NONREACTIVE Final   Comment: (NOTE) A NONREACTIVE HIV Ag/Ab result does not exclude HIV infection since the time frame for seroconversion is variable. If acute HIV infection is suspected, a HIV-1 RNA  Qualitative TMA test is recommended. HIV-1/2 Antibody Diff         Not indicated. HIV-1 RNA, Qual TMA           Not indicated. PLEASE NOTE: This information has been disclosed to you from records whose confidentiality may be protected by state law. If your state requires such protection, then the state law prohibits you from making any further disclosure of the information without the specific written consent of the person to whom it pertains, or as otherwise permitted by law. A general authorization for the release of medical or other information is NOT sufficient for this purpose. The performance of this assay has not been clinically validated in patients less than 77 years old. Performed at [facility]   . Specimen Description 1March 20, 202015 URINE, RANDOM   Final  . Special Requests 1March 20, 202015 NONE   Final  . Legionella Antigen, Urine 1March 20, 202015    Final                   Value:Negative for Legionella pneumophila serogroup 1                                                              Legionella pneumophila serogroup 1 antigen can be detected in urine within 2 to 3 days of infection and may persist even after treatment. This  assay does not detect other Legionella species or serogroups. Performed at [facility]   . Report Status 1March 20, 202015 04/15/2014 FINAL   Final  . Strep Pneumo Urinary Antigen 1March 20, 202015 NEGATIVE  NEGATIVE Final   Comment:        Infection due to S. pneumoniae cannot be absolutely ruled out since the antigen  present may be below the detection limit of the test. (NOTE) [place] Performed at [facility]   . MRSA by PCR 1March 20, 202015 NEGATIVE  NEGATIVE Final   Comment:        The GeneXpert MRSA Assay (FDA approved for NASAL specimens only), is one component of a comprehensive MRSA colonization surveillance program. It is not intended to diagnose MRSA infection nor to guide or monitor treatment for MRSA infections.   . Glucose-Capillary 1March 20, 202015 235* 70 - 99 mg/dL Final  . Glucose-Capillary 1March 20, 202015 284* 70 - 99 mg/dL Final  . Sodium 04/13/2014 138  137 - 147 mEq/L Final  . Potassium 04/13/2014 4.5  3.7 - 5.3 mEq/L Final  . Chloride 04/13/2014 106  96 - 112 mEq/L Final  . CO2 04/13/2014 22  19 - 32 mEq/L Final  . Glucose, Bld 04/13/2014 115* 70 - 99 mg/dL Final  . BUN 04/13/2014 25* 6 - 23 mg/dL Final  . Creatinine, Ser 04/13/2014 1.16  0.50 - 1.35 mg/dL Final  . Calcium 04/13/2014 7.8* 8.4 - 10.5 mg/dL Final  . Total Protein 04/13/2014 5.5* 6.0 - 8.3 g/dL Final  . Albumin 04/13/2014 2.3* 3.5 - 5.2 g/dL Final  . AST 04/13/2014 21  0 - 37 U/L Final  . ALT 04/13/2014 19  0 - 53 U/L Final  . Alkaline Phosphatase 04/13/2014 44  39 - 117 U/L Final  . Total Bilirubin 04/13/2014 0.5  0.3 - 1.2 mg/dL Final  . GFR calc non Af Amer 04/13/2014 59* >90 mL/min Final  . GFR calc Af Amer 04/13/2014 69* >  90 mL/min Final   Comment: (NOTE) The eGFR has been calculated using the CKD EPI equation. This calculation has not been validated in all clinical situations. eGFR's persistently <90 mL/min signify possible Chronic Kidney Disease.   . Anion gap 04/13/2014 10  5 - 15 Final  . WBC 04/13/2014 20.3* 4.0 - 10.5 K/uL Final  . RBC 04/13/2014 2.67* 4.22 - 5.81 MIL/uL Final  . Hemoglobin 04/13/2014 10.6* 13.0 - 17.0 g/dL Final  . HCT 04/13/2014 31.1* 39.0 - 52.0 % Final  . MCV 04/13/2014 116.5* 78.0 - 100.0 fL Final  . MCH 04/13/2014 39.7* 26.0 - 34.0 pg Final  . MCHC 04/13/2014 34.1  30.0 - 36.0  g/dL Final  . RDW 04/13/2014 14.4  11.5 - 15.5 % Final  . Platelets 04/13/2014 157  150 - 400 K/uL Final  . Glucose-Capillary 12020-06-1113 106* 70 - 99 mg/dL Final  . Glucose-Capillary 04/13/2014 115* 70 - 99 mg/dL Final  . Comment 1 04/13/2014 Documented in Chart   Final  . Comment 2 04/13/2014 Notify RN   Final  . Retic Ct Pct 04/13/2014 1.6  0.4 - 3.1 % Final  . RBC. 04/13/2014 2.65* 4.22 - 5.81 MIL/uL Final  . Retic Count, Manual 04/13/2014 42.4  19.0 - 186.0 K/uL Final  . Glucose-Capillary 04/13/2014 225* 70 - 99 mg/dL Final  . Comment 1 04/13/2014 Notify RN   Final  . Glucose-Capillary 04/13/2014 249* 70 - 99 mg/dL Final  . Glucose-Capillary 04/13/2014 293* 70 - 99 mg/dL Final  . Comment 1 04/13/2014 Notify RN   Final  . Glucose-Capillary 04/14/2014 124* 70 - 99 mg/dL Final  . Comment 1 04/14/2014 Notify RN   Final  . Glucose-Capillary 04/14/2014 207* 70 - 99 mg/dL Final  . Comment 1 04/14/2014 Notify RN   Final  . Glucose-Capillary 04/14/2014 350* 70 - 99 mg/dL Final  . Comment 1 04/14/2014 Notify RN   Final  . Glucose-Capillary 04/14/2014 294* 70 - 99 mg/dL Final  . Comment 1 04/14/2014 Notify RN   Final  . Glucose-Capillary 04/15/2014 164* 70 - 99 mg/dL Final  . Comment 1 04/15/2014 Notify RN   Final  . Glucose-Capillary 04/15/2014 242* 70 - 99 mg/dL Final  . Comment 1 04/15/2014 Notify RN   Final  . Glucose-Capillary 04/15/2014 322* 70 - 99 mg/dL Final  . Comment 1 04/15/2014 Notify RN   Final  . Sodium 04/16/2014 136* 137 - 147 mEq/L Final  . Potassium 04/16/2014 4.1  3.7 - 5.3 mEq/L Final  . Chloride 04/16/2014 103  96 - 112 mEq/L Final  . CO2 04/16/2014 22  19 - 32 mEq/L Final  . Glucose, Bld 04/16/2014 134* 70 - 99 mg/dL Final  . BUN 04/16/2014 31* 6 - 23 mg/dL Final  . Creatinine, Ser 04/16/2014 1.36* 0.50 - 1.35 mg/dL Final  . Calcium 04/16/2014 8.8  8.4 - 10.5 mg/dL Final  . GFR calc non Af Amer 04/16/2014 49* >90 mL/min Final  . GFR calc Af Amer 04/16/2014 57*  >90 mL/min Final   Comment: (NOTE) The eGFR has been calculated using the CKD EPI equation. This calculation has not been validated in all clinical situations. eGFR's persistently <90 mL/min signify possible Chronic Kidney Disease.   . Anion gap 04/16/2014 11  5 - 15 Final  . WBC 04/16/2014 17.4* 4.0 - 10.5 K/uL Final  . RBC 04/16/2014 2.74* 4.22 - 5.81 MIL/uL Final  . Hemoglobin 04/16/2014 10.7* 13.0 - 17.0 g/dL Final  . HCT 04/16/2014 30.9* 39.0 -  52.0 % Final  . MCV 04/16/2014 112.8* 78.0 - 100.0 fL Final  . MCH 04/16/2014 39.1* 26.0 - 34.0 pg Final  . MCHC 04/16/2014 34.6  30.0 - 36.0 g/dL Final  . RDW 04/16/2014 14.5  11.5 - 15.5 % Final  . Platelets 04/16/2014 202  150 - 400 K/uL Final  . Neutrophils Relative % 04/16/2014 28* 43 - 77 % Final  . Neutro Abs 04/16/2014 4.8  1.7 - 7.7 K/uL Final  . Lymphocytes Relative 04/16/2014 68* 12 - 46 % Final  . Lymphs Abs 04/16/2014 11.8* 0.7 - 4.0 K/uL Final  . Monocytes Relative 04/16/2014 4  3 - 12 % Final  . Monocytes Absolute 04/16/2014 0.7  0.1 - 1.0 K/uL Final  . Eosinophils Relative 04/16/2014 0  0 - 5 % Final  . Eosinophils Absolute 04/16/2014 0.0  0.0 - 0.7 K/uL Final  . Basophils Relative 04/16/2014 0  0 - 1 % Final  . Basophils Absolute 04/16/2014 0.1  0.0 - 0.1 K/uL Final  . WBC Morphology 04/16/2014 ABSOLUTE LYMPHOCYTOSIS   Final   Comment: ATYPICAL LYMPHOCYTES SMUDGE CELLS   . Glucose-Capillary 04/15/2014 274* 70 - 99 mg/dL Final  . Comment 1 04/15/2014 Notify RN   Final  . Vancomycin Tr 04/16/2014 15.6  10.0 - 20.0 ug/mL Final  . Glucose-Capillary 04/16/2014 126* 70 - 99 mg/dL Final  . Comment 1 04/16/2014 Documented in Chart   Final  . Comment 2 04/16/2014 Notify RN   Final  . Glucose-Capillary 04/16/2014 297* 70 - 99 mg/dL Final  . Comment 1 04/16/2014 Documented in Chart   Final  . Comment 2 04/16/2014 Notify RN   Final  . Glucose-Capillary 04/16/2014 253* 70 - 99 mg/dL Final  . Comment 1 04/16/2014 Notify RN    Final  . Glucose-Capillary 04/16/2014 142* 70 - 99 mg/dL Final  . Comment 1 04/16/2014 Notify RN   Final  . Comment 2 04/16/2014 Documented in Chart   Final  . Glucose-Capillary 04/17/2014 132* 70 - 99 mg/dL Final  . Comment 1 04/17/2014 Documented in Chart   Final  . Comment 2 04/17/2014 Notify RN   Final  Appointment on 03/25/2014  Component Date Value Ref Range Status  . WBC 03/25/2014 38.6* 4.0 - 10.5 K/uL Final  . RBC 03/25/2014 3.14* 4.22 - 5.81 MIL/uL Final  . Hemoglobin 03/25/2014 12.6* 13.0 - 17.0 g/dL Final  . HCT 03/25/2014 37.7* 39.0 - 52.0 % Final  . MCV 03/25/2014 120.1* 78.0 - 100.0 fL Final  . MCH 03/25/2014 40.1* 26.0 - 34.0 pg Final  . MCHC 03/25/2014 33.4  30.0 - 36.0 g/dL Final  . RDW 03/25/2014 16.1* 11.5 - 15.5 % Final  . Platelets 03/25/2014 149* 150 - 400 K/uL Final  . Neutrophils Relative % 03/25/2014 15* 43 - 77 % Final  . Neutro Abs 03/25/2014 5.7  1.7 - 7.7 K/uL Final  . Lymphocytes Relative 03/25/2014 83* 12 - 46 % Final  . Lymphs Abs 03/25/2014 32.2* 0.7 - 4.0 K/uL Final  . Monocytes Relative 03/25/2014 2* 3 - 12 % Final  . Monocytes Absolute 03/25/2014 0.6  0.1 - 1.0 K/uL Final  . Eosinophils Relative 03/25/2014 0  0 - 5 % Final  . Eosinophils Absolute 03/25/2014 0.1  0.0 - 0.7 K/uL Final  . Basophils Relative 03/25/2014 0  0 - 1 % Final  . Basophils Absolute 03/25/2014 0.1  0.0 - 0.1 K/uL Final  . WBC Morphology 03/25/2014 ABSOLUTE LYMPHOCYTOSIS   Final  Comment: ATYPICAL LYMPHOCYTES SMUDGE CELLS   . RBC Morphology 03/25/2014 POLYCHROMASIA PRESENT   Final    PATHOLOGY: No new pathology.  Urinalysis    Component Value Date/Time   COLORURINE YELLOW 11/03/2010 0705   APPEARANCEUR CLEAR 11/03/2010 0705   LABSPEC 1.020 11/03/2010 0705   PHURINE 5.5 11/03/2010 0705   GLUCOSEU NEGATIVE 11/03/2010 0705   HGBUR SMALL* 11/03/2010 0705   BILIRUBINUR NEGATIVE 11/03/2010 0705   KETONESUR TRACE* 11/03/2010 0705   PROTEINUR TRACE* 11/03/2010 0705    UROBILINOGEN 0.2 11/03/2010 0705   NITRITE NEGATIVE 11/03/2010 0705   LEUKOCYTESUR NEGATIVE 11/03/2010 0705    RADIOGRAPHIC STUDIES: Dg Chest 2 View  04/14/2014   CLINICAL DATA:  LEFT lower lobe pneumonia.  Subsequent encounter.  EXAM: CHEST  2 VIEW  COMPARISON:  110/06/202015.  FINDINGS: Cardiopericardial silhouette is within normal limits for projection. Aortic arch atherosclerosis. Unchanged RIGHT subclavian Port-A-Cath. RIGHT lower lobe pneumonia is present. There is also airspace disease in the posterior RIGHT middle lobe, evident on the lateral view. Blunting of the RIGHT costophrenic angle on the frontal view compatible with a small parapneumonic effusion. LEFT lung appears within normal limits.  IMPRESSION: RIGHT middle and RIGHT lower lobe pneumonia. Small RIGHT parapneumonic effusion. Minimal improvement in aeration compared to prior.   Electronically Signed   By: Dereck Ligas M.D.   On: 04/14/2014 08:09   Dg Chest Portable 1 View  110/06/202015   CLINICAL DATA:  Diaphoresis and weakness.  History of leukemia  EXAM: PORTABLE CHEST - 1 VIEW  COMPARISON:  None.  FINDINGS: There is consolidation in the right lower lobe. There is mild left base atelectasis. Heart size and pulmonary vascularity are normal. No adenopathy. Port-A-Cath tip is in the superior vena cava. No pneumothorax. No bone lesions.  IMPRESSION: Right lower lobe consolidation.  Mild atelectasis left base.   Electronically Signed   By: Lowella Grip M.D.   On: 110/06/202015 09:43    ASSESSMENT:  #1. Chronic lymphocytic leukemia with leukocytoclastic vasculitis, improved, with anticipated lymphocytosis secondary to Ibrutinib. #2. Recent episode of disseminated zoster with keratoconjunctivitis, resolved, currently on prophylactic acyclovir. #3. Episode of bronchopneumonia, treated without biotics plus IV IgG, to complete Levaquin 750 mg daily for 2 more days.  PLAN:  #1. Continue ibrutinib 420 mg daily along with acyclovir 400 mg  twice a day. #2. Follow-up in 4 weeks with CBC, reticulocyte count.   All questions were answered. The patient knows to call the clinic with any problems, questions or concerns. We can certainly see the patient much sooner if necessary.   I spent 25 minutes counseling the patient face to face. The total time spent in the appointment was 30 minutes.    Doroteo Bradford, MD 04/23/2014 7:22 AM  DISCLAIMER:  This note was dictated with voice recognition software.  Similar sounding words can inadvertently be transcribed inaccurately and may not be corrected upon review.

## 2014-04-22 NOTE — Progress Notes (Signed)
Randall Reyes presented for labwork. Labs per MD order drawn via Peripheral Line 23 gauge needle inserted in right antecubital.  Good blood return present. Procedure without incident.  Needle removed intact. Patient tolerated procedure well.

## 2014-05-13 ENCOUNTER — Other Ambulatory Visit (HOSPITAL_COMMUNITY): Payer: Self-pay | Admitting: Oncology

## 2014-05-17 ENCOUNTER — Encounter (HOSPITAL_COMMUNITY): Payer: Medicare Other

## 2014-05-21 ENCOUNTER — Encounter (HOSPITAL_COMMUNITY): Payer: Self-pay

## 2014-05-21 ENCOUNTER — Ambulatory Visit (HOSPITAL_COMMUNITY): Payer: Medicare Other | Admitting: Hematology & Oncology

## 2014-05-21 ENCOUNTER — Encounter (HOSPITAL_COMMUNITY): Payer: Medicare Other | Attending: Oncology

## 2014-05-21 DIAGNOSIS — M310 Hypersensitivity angiitis: Secondary | ICD-10-CM | POA: Insufficient documentation

## 2014-05-21 DIAGNOSIS — C9110 Chronic lymphocytic leukemia of B-cell type not having achieved remission: Secondary | ICD-10-CM

## 2014-05-21 LAB — CBC WITH DIFFERENTIAL/PLATELET
Basophils Absolute: 0 10*3/uL (ref 0.0–0.1)
Basophils Relative: 0 % (ref 0–1)
Eosinophils Absolute: 0 10*3/uL (ref 0.0–0.7)
Eosinophils Relative: 0 % (ref 0–5)
HEMATOCRIT: 32.8 % — AB (ref 39.0–52.0)
HEMOGLOBIN: 11.2 g/dL — AB (ref 13.0–17.0)
LYMPHS PCT: 40 % (ref 12–46)
Lymphs Abs: 5.4 10*3/uL — ABNORMAL HIGH (ref 0.7–4.0)
MCH: 39.7 pg — AB (ref 26.0–34.0)
MCHC: 34.1 g/dL (ref 30.0–36.0)
MCV: 116.3 fL — ABNORMAL HIGH (ref 78.0–100.0)
MONOS PCT: 3 % (ref 3–12)
Monocytes Absolute: 0.4 10*3/uL (ref 0.1–1.0)
Neutro Abs: 7.6 10*3/uL (ref 1.7–7.7)
Neutrophils Relative %: 57 % (ref 43–77)
Platelets: 183 10*3/uL (ref 150–400)
RBC: 2.82 MIL/uL — ABNORMAL LOW (ref 4.22–5.81)
RDW: 15.2 % (ref 11.5–15.5)
WBC: 13.4 10*3/uL — ABNORMAL HIGH (ref 4.0–10.5)

## 2014-05-21 LAB — RETICULOCYTES
RBC.: 2.82 MIL/uL — AB (ref 4.22–5.81)
RETIC COUNT ABSOLUTE: 81.8 10*3/uL (ref 19.0–186.0)
Retic Ct Pct: 2.9 % (ref 0.4–3.1)

## 2014-05-21 MED ORDER — SODIUM CHLORIDE 0.9 % IJ SOLN
10.0000 mL | INTRAMUSCULAR | Status: DC | PRN
  Administered 2014-05-21: 10 mL via INTRAVENOUS
  Filled 2014-05-21: qty 10

## 2014-05-21 MED ORDER — HEPARIN SOD (PORK) LOCK FLUSH 100 UNIT/ML IV SOLN
500.0000 [IU] | Freq: Once | INTRAVENOUS | Status: AC
  Administered 2014-05-21: 500 [IU] via INTRAVENOUS
  Filled 2014-05-21: qty 5

## 2014-05-21 NOTE — Progress Notes (Deleted)
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 His beta-2 microglobulin level was up to 8.06 his platelets remain good but he became hypogammaglobulinemic. He had developed diffuse lymphadenopathy and I treated him with fludarabine Cytoxan and Rituxan for 3 cycles before he developed significant pancytopenia which prompted the halt in chemotherapy  CURRENT THERAPY: Ibrutinib        IVIG  INTERVAL HISTORY: Randall Reyes 79 y.o. male returns for   MEDICAL HISTORY: Past Medical History  Diagnosis Date  . Diabetes mellitus   . Leukemia 6.22.2012    PER PATIENT  . Hypertension   . Ulcer   . Anemia   . CLL (chronic lymphocytic leukemia) 01/24/2011  . Melanoma in situ 01/24/2011  . DM (diabetes mellitus) 01/24/2011  . Mild obesity 01/24/2011  . Eczema 01/24/2011  . Port catheter in place 05/02/2012  . Shingles     has CLL (chronic lymphocytic leukemia); Melanoma in situ; DM (diabetes mellitus); Mild obesity; Eczema; Port catheter in place; Pneumonia; Sepsis due to pneumonia; and Leukocytosis on his problem list.      CLL (chronic lymphocytic leukemia)   02/14/2005 Procedure Peripheral flow cytometry- CLL (without 11q or 17 p deletion) presenting with stage 0 disease   10/25/2008 Progression Increasing WBC and symptoms suggestive of progressive disease   11/09/2008 - 01/07/2009 Chemotherapy Rituxan, fludarabine, cytoxan x 3 cycles with the discontinuation of fludarabine and cytoxan due to severe pancytopenia requiring hospitalization.   01/10/2009 Adverse Reaction Severe pancytopenia and adverse side effects   02/01/2009 - 04/01/2009 Chemotherapy Rituxan every 21 days x 3 cycles   04/02/2009 Remission Stable disease.  Chemotherapy holiday started   06/22/2013 - 06/29/2013 Chemotherapy Imbruvica daily   06/29/2013 Adverse Reaction Large right posterior leg (popliteal) ecchymosis/violaceous rash.  Likely Imbruvica-induced   07/31/2013 - 10/12/2013 Chemotherapy Zydelig (idelalisib) 150 mg BID monotherapy   10/12/2013 Adverse Reaction Transaminitis, suspected to be secondary to [place].  Dose decreased.   10/12/2013 - 11/09/2013 Chemotherapy Zydelig (idelalisib) 150 mg daily.   11/09/2013 Adverse Reaction Worsening transaminitis.  Zydelig on HOLD   11/09/2013 Treatment Plan Change Hold Zydelig   12/28/2013 Adverse Reaction Herpes Zoster outbreak.  Partial response with Famvir x 1 week, switched to acyclovir.  Progression over 1-2 weeks with development of keratoconjunctivitis bilaterally.  VZ Immune Globulin 625 mg IM given on 01/13/14.   02/08/2014 Treatment Plan Change Ibrutinib restarted 140 mg daily for one week followed by 280 mg daily followed by 420 mg daily with prophylactic acyclovir.   02/08/2014 -  Chemotherapy Ibrutinib 420 mg daily   04/13/2014 [facility] Admission Right lower lobe pneumonia     is allergic to aspirin and ibuprofen.  Randall Reyes does not currently have medications on file.  SURGICAL HISTORY: Past Surgical History  Procedure Laterality Date  . Hernia repair  2001  . Bleeding ulcer    . Portacath placement  2008  . Cataract extraction w/phaco  05/05/2012    Procedure: CATARACT EXTRACTION PHACO AND INTRAOCULAR LENS PLACEMENT (IOC);  Surgeon: Tonny Branch, MD;  Location: [facility];  Service: Ophthalmology;  Laterality: Right;  CDE:13.25  . Cataract extraction w/phaco  05/15/2012    Procedure: CATARACT EXTRACTION PHACO AND INTRAOCULAR LENS PLACEMENT (IOC);  Surgeon: Tonny Branch, MD;  Location: [facility];  Service: Ophthalmology;  Laterality: Left;  CDE:  12.32    SOCIAL HISTORY: History   Social History  . Marital Status: Married    Spouse Name: N/A    Number of Children: N/A  . Years of Education: N/A   Occupational History  .  Not on file.   Social History Main Topics  . Smoking status: Former Research scientist (life sciences)  . Smokeless tobacco: Former Systems developer    Quit date: 04/25/1967  . Alcohol Use: No  . Drug Use: No  . Sexual Activity: Not on file   Other Topics Concern  . Not on file   Social History Narrative    FAMILY HISTORY: Family History  Problem Relation Age of Onset  . Diabetes Father     ROS  PHYSICAL EXAMINATION  ECOG PERFORMANCE STATUS: {CHL ONC ECOG ZH:[id number]}  There were no vitals filed for this visit.  Physical Exam  LABORATORY DATA:  CBC    Component Value Date/Time   WBC 26.3* 04/22/2014 1450   RBC 2.95* 04/22/2014 1450   RBC 2.95* 04/22/2014 1450   HGB 11.7* 04/22/2014 1450   HCT 33.3* 04/22/2014 1450   PLT  04/22/2014 1450    PLATELET CLUMPS NOTED ON SMEAR, COUNT APPEARS ADEQUATE   MCV 112.9* 04/22/2014 1450   MCH 39.7* 04/22/2014 1450   MCHC 35.1 04/22/2014 1450   RDW 15.2 04/22/2014 1450   LYMPHSABS 15.2* 04/22/2014 1450   MONOABS 0.3 04/22/2014 1450   EOSABS 0.0 04/22/2014 1450   BASOSABS 0.1 04/22/2014 1450   CMP     Component Value Date/Time   NA 136* 04/16/2014 0541   K 4.1 04/16/2014 0541   CL 103 04/16/2014 0541   CO2 22 04/16/2014 0541   GLUCOSE 134* 04/16/2014 0541   BUN 31* 04/16/2014 0541   CREATININE 1.36* 04/16/2014 0541   CALCIUM 8.8 04/16/2014 0541   PROT 5.5* 04/13/2014 0426   ALBUMIN 2.3* 04/13/2014 0426   AST 21 04/13/2014 0426   ALT 19 04/13/2014 0426   ALKPHOS 44 04/13/2014 0426   BILITOT 0.5 04/13/2014 0426   GFRNONAA 49* 04/16/2014 0541   GFRAA 57* 04/16/2014 0541     PENDING LABS:   RADIOGRAPHIC STUDIES:  No results found.   PATHOLOGY:     ASSESSMENT and THERAPY PLAN:    No problem-specific assessment & plan notes found for this encounter.   All questions were answered. The patient knows to call the clinic with any problems, questions or concerns. We can certainly see the patient much sooner if necessary.  I spent {CHL ONC  TIME VISIT - EXBMW:[id number]} counseling the patient face to face. The total time spent in the appointment was {CHL ONC TIME VISIT - VOZDG:[id number]}.  Molli Hazard 05/21/2014      This encounter was created in error - please disregard.

## 2014-05-21 NOTE — Patient Instructions (Signed)
We flushed your port today and drew lab work.  Follow up as scheduled and call teh clinic if you have any questions or concerns

## 2014-05-21 NOTE — Progress Notes (Signed)
Randall Reyes presented for Portacath access and flush.  Proper placement of portacath confirmed by CXR.  Portacath located right chest wall accessed with  H 20 needle.  No blood return and flushed easily Portacath flushed with 38ml NS and 500U/25ml Heparin and needle removed intact.  Procedure tolerated well and without incident.  \   [name]'s reason for visit today is for labs as scheduled per MD orders.  Venipuncture performed with a 23 gauge butterfly needle to R Antecubital.  Randall Reyes tolerated procedure well and without incident; questions were answered and patient was discharged.

## 2014-05-24 ENCOUNTER — Encounter (HOSPITAL_COMMUNITY): Payer: Medicare Other

## 2014-05-28 ENCOUNTER — Encounter (HOSPITAL_BASED_OUTPATIENT_CLINIC_OR_DEPARTMENT_OTHER): Payer: Medicare Other | Admitting: Hematology & Oncology

## 2014-05-28 ENCOUNTER — Encounter (HOSPITAL_BASED_OUTPATIENT_CLINIC_OR_DEPARTMENT_OTHER): Payer: Medicare Other

## 2014-05-28 ENCOUNTER — Encounter (HOSPITAL_COMMUNITY): Payer: Self-pay | Admitting: Hematology & Oncology

## 2014-05-28 VITALS — BP 141/87 | HR 89 | Temp 98.2°F | Resp 18 | Wt 132.9 lb

## 2014-05-28 DIAGNOSIS — C9110 Chronic lymphocytic leukemia of B-cell type not having achieved remission: Secondary | ICD-10-CM

## 2014-05-28 DIAGNOSIS — D899 Disorder involving the immune mechanism, unspecified: Secondary | ICD-10-CM

## 2014-05-28 DIAGNOSIS — D849 Immunodeficiency, unspecified: Secondary | ICD-10-CM

## 2014-05-28 MED ORDER — AZITHROMYCIN 250 MG PO TABS: ORAL_TABLET | ORAL | Status: DC

## 2014-05-28 NOTE — Progress Notes (Signed)
Randall Hampshire, MD [place] [address]  CLL, trisomy 12 Leukocytoclasic Vasculitis Herpes Zoster  CURRENT THERAPY: Ibrutinib 420mg  daily  INTERVAL HISTORY: Randall Reyes 79 y.o. male returns for follow-up of his CLL. He has been on his ibrutinib for about one year.  He picks it up here. He has been hospitalized for pneumonia. He is doing ok but is still having a productive cough.  MEDICAL HISTORY: Past Medical History  Diagnosis Date  . Diabetes mellitus   . Leukemia 6.22.2012    PER PATIENT  . Hypertension   . Ulcer   . Anemia   . CLL (chronic lymphocytic leukemia) 01/24/2011  . Melanoma in situ 01/24/2011  . DM (diabetes mellitus) 01/24/2011  . Mild obesity 01/24/2011  . Eczema 01/24/2011  . Port catheter in place 05/02/2012  . Shingles   . History of pneumonia 04/2014    has CLL (chronic lymphocytic leukemia); Melanoma in situ; DM (diabetes mellitus); Mild obesity; Eczema; Port catheter in place; Pneumonia; Sepsis due to pneumonia; and Leukocytosis on his problem list.      CLL (chronic lymphocytic leukemia)   02/14/2005 Procedure Peripheral flow cytometry- CLL (without 11q or 17 p deletion) presenting with stage 0 disease   10/25/2008 Progression Increasing WBC and symptoms suggestive of progressive disease   11/09/2008 - 01/07/2009 Chemotherapy Rituxan, fludarabine, cytoxan x 3 cycles with the discontinuation of fludarabine and cytoxan due to severe pancytopenia requiring hospitalization.   01/10/2009 Adverse Reaction Severe pancytopenia and adverse side effects   02/01/2009 - 04/01/2009 Chemotherapy Rituxan every 21 days x 3 cycles   04/02/2009 Remission Stable disease.  Chemotherapy holiday started   06/22/2013 - 06/29/2013 Chemotherapy Imbruvica daily   06/29/2013 Adverse Reaction Large right posterior leg (popliteal) ecchymosis/violaceous rash.  Likely Imbruvica-induced   07/31/2013 - 10/12/2013 Chemotherapy Zydelig (idelalisib) 150 mg BID  monotherapy   10/12/2013 Adverse Reaction Transaminitis, suspected to be secondary to [place].  Dose decreased.   10/12/2013 - 11/09/2013 Chemotherapy Zydelig (idelalisib) 150 mg daily.   11/09/2013 Adverse Reaction Worsening transaminitis.  Zydelig on HOLD   11/09/2013 Treatment Plan Change Hold Zydelig   12/28/2013 Adverse Reaction Herpes Zoster outbreak.  Partial response with Famvir x 1 week, switched to acyclovir.  Progression over 1-2 weeks with development of keratoconjunctivitis bilaterally.  VZ Immune Globulin 625 mg IM given on 01/13/14.   02/08/2014 Treatment Plan Change Ibrutinib restarted 140 mg daily for one week followed by 280 mg daily followed by 420 mg daily with prophylactic acyclovir.   02/08/2014 -  Chemotherapy Ibrutinib 420 mg daily   [facility] Right lower [facility]     is allergic to aspirin and ibuprofen.  [name] does not currently have medications on file.  SURGICAL HISTORY: Past Surgical History  Procedure Laterality Date  . Hernia repair  2001  . Bleeding ulcer    . Portacath placement  2008  . Cataract extraction w/phaco  05/05/2012    Procedure: CATARACT EXTRACTION PHACO AND INTRAOCULAR LENS PLACEMENT (IOC);  Surgeon: Tonny Branch, MD;  Location: [facility];  Service: Ophthalmology;  Laterality: Right;  CDE:13.25  . Cataract extraction w/phaco  05/15/2012    Procedure: CATARACT EXTRACTION PHACO AND INTRAOCULAR LENS PLACEMENT (IOC);  Surgeon: Tonny Branch, MD;  Location: [facility];  Service: Ophthalmology;  Laterality: Left;  CDE:  12.32    SOCIAL HISTORY: History   Social History  . Marital Status: Married    Spouse Name: N/A  Number of Children: N/A  . Years of Education: N/A   Occupational History  . Not on file.   Social History Main Topics  . Smoking status: Former Research scientist (life sciences)  . Smokeless tobacco: Former Systems developer    Quit date: 04/25/1967  . Alcohol Use: No  . Drug Use: No  . Sexual Activity: Not on file   Other Topics Concern  . Not  on file   Social History Narrative    FAMILY HISTORY: Family History  Problem Relation Age of Onset  . Diabetes Father     Review of Systems  Constitutional: Negative.   HENT: Positive for congestion. Negative for ear discharge, ear pain, hearing loss, nosebleeds, sore throat and tinnitus.   Eyes: Negative.        Wears glasses  Respiratory: Positive for cough and sputum production. Negative for hemoptysis, shortness of breath, wheezing and stridor.   Cardiovascular: Negative.   Gastrointestinal: Positive for heartburn. Negative for nausea, vomiting, abdominal pain, diarrhea, constipation, blood in stool and melena.  Genitourinary: Negative for dysuria, urgency, frequency, hematuria and flank pain.       Nocturia 2 x  Musculoskeletal: Negative.   Skin: Negative.   Neurological: Negative.  Negative for headaches.  Endo/Heme/Allergies: Negative.   Psychiatric/Behavioral: Negative.     PHYSICAL EXAMINATION  ECOG PERFORMANCE STATUS: 1 - Symptomatic but completely ambulatory  Filed Vitals:   05/28/14 1046  BP: 141/87  Pulse: 89  Temp: 98.2 F (36.8 C)  Resp: 18    Physical Exam  Constitutional: He is oriented to person, place, and time and well-developed, well-nourished, and in no distress.  HENT:  Head: Normocephalic and atraumatic.  Nose: Nose normal.  Mouth/Throat: Oropharynx is clear and moist. No oropharyngeal exudate.  Eyes: Conjunctivae and EOM are normal. Pupils are equal, round, and reactive to light. Right eye exhibits no discharge. Left eye exhibits no discharge. No scleral icterus.  Neck: Normal range of motion. Neck supple. No tracheal deviation present. No thyromegaly present.  Cardiovascular: Normal rate, regular rhythm and normal heart sounds.  Exam reveals no gallop and no friction rub.   No murmur heard. Pulmonary/Chest: Effort normal and breath sounds normal. He has no wheezes. He has no rales.  Abdominal: Soft. Bowel sounds are normal. He exhibits no  distension and no mass. There is no tenderness. There is no rebound and no guarding.  Musculoskeletal: Normal range of motion. He exhibits no edema.  Lymphadenopathy:    He has no cervical adenopathy.  Neurological: He is alert and oriented to person, place, and time. He has normal reflexes. No cranial nerve deficit. Gait normal. Coordination normal.  Skin: Skin is warm and dry. No rash noted.  Psychiatric: Mood, memory, affect and judgment normal.  Nursing note and vitals reviewed.   LABORATORY DATA:  CBC    Component Value Date/Time   WBC 13.4* 05/21/2014 1422   RBC 2.82* 05/21/2014 1422   RBC 2.82* 05/21/2014 1422   HGB 11.2* 05/21/2014 1422   HCT 32.8* 05/21/2014 1422   PLT 183 05/21/2014 1422   MCV 116.3* 05/21/2014 1422   MCH 39.7* 05/21/2014 1422   MCHC 34.1 05/21/2014 1422   RDW 15.2 05/21/2014 1422   LYMPHSABS 5.4* 05/21/2014 1422   MONOABS 0.4 05/21/2014 1422   EOSABS 0.0 05/21/2014 1422   BASOSABS 0.0 05/21/2014 1422   CMP     Component Value Date/Time   NA 136* 04/16/2014 0541   K 4.1 04/16/2014 0541   CL 103 04/16/2014 0541  CO2 22 04/16/2014 0541   GLUCOSE 134* 04/16/2014 0541   BUN 31* 04/16/2014 0541   CREATININE 1.36* 04/16/2014 0541   CALCIUM 8.8 04/16/2014 0541   PROT 5.5* 04/13/2014 0426   ALBUMIN 2.3* 04/13/2014 0426   AST 21 04/13/2014 0426   ALT 19 04/13/2014 0426   ALKPHOS 44 04/13/2014 0426   BILITOT 0.5 04/13/2014 0426   GFRNONAA 49* 04/16/2014 0541   GFRAA 57* 04/16/2014 0541      ASSESSMENT and THERAPY PLAN:    CLL (chronic lymphocytic leukemia) Pleasant 79 year old male with CLL, initially diagnosed in 2010.he was treated in June 2010 with FCR that was discontinued secondary to severe pancytopenia and hospitalization. He was able to take a chemotherapy holiday until early 2015 when it appears he was developing worsening anemia. He was started on Imbruvica, felt to develop a drug rash, changed to [facility] which was discontinued  secondary to transaminitis. He is currently now on Ibrutinib. His counts today are fairly good. He would like to continue with his current medication.  He has had an episode of pneumonia was hospitalized and given IVIG during that admission. Any additional needs for IVIG therapy will be followed moving forward. We will see him back in 1 month with repeat laboratory studies, sooner if needed.     All questions were answered. The patient knows to call the clinic with any problems, questions or concerns. We can certainly see the patient much sooner if necessary.    Molli Hazard 06/01/2014

## 2014-05-28 NOTE — Patient Instructions (Signed)
[place] at [facility]  Discharge Instructions: We will call you about the IVIG. I will see you back in one month.  _______________________________________________________________  Thank you for choosing [place] at [facility] to provide your oncology and hematology care.  To afford each patient quality time with our providers, please arrive at least 15 minutes before your scheduled appointment.  You need to re-schedule your appointment if you arrive 10 or more minutes late.  We strive to give you quality time with our providers, and arriving late affects you and other patients whose appointments are after yours.  Also, if you no show three or more times for appointments you may be dismissed from the clinic.  Again, thank you for choosing [place] at [place] hope is that these requests will allow you access to exceptional care and in a timely manner. _______________________________________________________________  If you have questions after your visit, please contact our office at [phone] between the hours of 8:30 a.m. and 5:00 p.m. Voicemails left after 4:30 p.m. will not be returned until the following business day. _______________________________________________________________  For prescription refill requests, have your pharmacy contact our office. _______________________________________________________________  Recommendations made by the consultant and any test results will be sent to your referring physician. _______________________________________________________________

## 2014-05-28 NOTE — Progress Notes (Signed)
LABS FOR SPE,SIFX,IGGSUB,IMMU

## 2014-05-29 LAB — IGG, IGA, IGM
IGG (IMMUNOGLOBIN G), SERUM: 1300 mg/dL (ref 650–1600)
IGM, SERUM: 20 mg/dL — AB (ref 41–251)
IgA: 72 mg/dL (ref 68–379)

## 2014-05-31 NOTE — Progress Notes (Signed)
Erroneous Encounter

## 2014-06-01 LAB — PROTEIN ELECTROPHORESIS, SERUM
ALPHA-1-GLOBULIN: 6.4 % — AB (ref 2.9–4.9)
ALPHA-2-GLOBULIN: 15.7 % — AB (ref 7.1–11.8)
Albumin ELP: 49.3 % — ABNORMAL LOW (ref 55.8–66.1)
BETA GLOBULIN: 6.1 % (ref 4.7–7.2)
Beta 2: 4.8 % (ref 3.2–6.5)
GAMMA GLOBULIN: 17.7 % (ref 11.1–18.8)
M-Spike, %: 0.14 g/dL
TOTAL PROTEIN ELP: 6.9 g/dL (ref 6.0–8.3)

## 2014-06-01 LAB — IMMUNOFIXATION ELECTROPHORESIS
IGG (IMMUNOGLOBIN G), SERUM: 1310 mg/dL (ref 650–1600)
IgA: 74 mg/dL (ref 68–379)
IgM, Serum: 19 mg/dL — ABNORMAL LOW (ref 41–251)
Total Protein ELP: 6.9 g/dL (ref 6.0–8.3)

## 2014-06-01 NOTE — Assessment & Plan Note (Signed)
Pleasant 79 year old male with CLL, initially diagnosed in 2010.he was treated in June 2010 with FCR that was discontinued secondary to severe pancytopenia and hospitalization. He was able to take a chemotherapy holiday until early 2015 when it appears he was developing worsening anemia. He was started on Imbruvica, felt to develop a drug rash, changed to [facility] which was discontinued secondary to transaminitis. He is currently now on Ibrutinib. His counts today are fairly good. He would like to continue with his current medication.  He has had an episode of pneumonia was hospitalized and given IVIG during that admission. Any additional needs for IVIG therapy will be followed moving forward. We will see him back in 1 month with repeat laboratory studies, sooner if needed.

## 2014-06-08 LAB — IGG 1, 2, 3, AND 4

## 2014-06-14 ENCOUNTER — Other Ambulatory Visit (HOSPITAL_COMMUNITY): Payer: Self-pay | Admitting: Oncology

## 2014-06-18 ENCOUNTER — Other Ambulatory Visit (HOSPITAL_COMMUNITY): Payer: Self-pay | Admitting: Hematology and Oncology

## 2014-06-23 ENCOUNTER — Other Ambulatory Visit (HOSPITAL_COMMUNITY): Payer: Self-pay | Admitting: Oncology

## 2014-06-24 ENCOUNTER — Other Ambulatory Visit (HOSPITAL_COMMUNITY): Payer: Self-pay | Admitting: Oncology

## 2014-06-24 DIAGNOSIS — I776 Arteritis, unspecified: Secondary | ICD-10-CM

## 2014-06-24 MED ORDER — PREDNISONE 10 MG PO TABS: 10.0000 mg | ORAL_TABLET | Freq: Every day | ORAL | Status: DC

## 2014-07-01 ENCOUNTER — Encounter (HOSPITAL_COMMUNITY): Payer: Medicare Other | Attending: Oncology

## 2014-07-01 DIAGNOSIS — M310 Hypersensitivity angiitis: Secondary | ICD-10-CM | POA: Diagnosis present

## 2014-07-01 DIAGNOSIS — C9110 Chronic lymphocytic leukemia of B-cell type not having achieved remission: Secondary | ICD-10-CM

## 2014-07-01 DIAGNOSIS — Z452 Encounter for adjustment and management of vascular access device: Secondary | ICD-10-CM

## 2014-07-01 DIAGNOSIS — Z95828 Presence of other vascular implants and grafts: Secondary | ICD-10-CM

## 2014-07-01 DIAGNOSIS — D899 Disorder involving the immune mechanism, unspecified: Secondary | ICD-10-CM

## 2014-07-01 DIAGNOSIS — D849 Immunodeficiency, unspecified: Secondary | ICD-10-CM

## 2014-07-01 LAB — COMPREHENSIVE METABOLIC PANEL
ALT: 136 U/L — AB (ref 0–53)
AST: 217 U/L — ABNORMAL HIGH (ref 0–37)
Albumin: 2.9 g/dL — ABNORMAL LOW (ref 3.5–5.2)
Alkaline Phosphatase: 160 U/L — ABNORMAL HIGH (ref 39–117)
Anion gap: 7 (ref 5–15)
BILIRUBIN TOTAL: 0.6 mg/dL (ref 0.3–1.2)
BUN: 30 mg/dL — ABNORMAL HIGH (ref 6–23)
CHLORIDE: 97 mmol/L (ref 96–112)
CO2: 30 mmol/L (ref 19–32)
CREATININE: 1.94 mg/dL — AB (ref 0.50–1.35)
Calcium: 8.4 mg/dL (ref 8.4–10.5)
GFR, EST AFRICAN AMERICAN: 37 mL/min — AB (ref >90)
GFR, EST NON AFRICAN AMERICAN: 32 mL/min — AB (ref >90)
GLUCOSE: 232 mg/dL — AB (ref 70–99)
Potassium: 4.3 mmol/L (ref 3.5–5.1)
Sodium: 134 mmol/L — ABNORMAL LOW (ref 135–145)
Total Protein: 6.5 g/dL (ref 6.0–8.3)

## 2014-07-01 LAB — CBC WITH DIFFERENTIAL/PLATELET
Basophils Absolute: 0 10*3/uL (ref 0.0–0.1)
Basophils Relative: 0 % (ref 0–1)
Eosinophils Absolute: 0 10*3/uL (ref 0.0–0.7)
Eosinophils Relative: 0 % (ref 0–5)
HCT: 31.8 % — ABNORMAL LOW (ref 39.0–52.0)
Hemoglobin: 10 g/dL — ABNORMAL LOW (ref 13.0–17.0)
Lymphocytes Relative: 27 % (ref 12–46)
Lymphs Abs: 4.1 10*3/uL — ABNORMAL HIGH (ref 0.7–4.0)
MCH: 37 pg — ABNORMAL HIGH (ref 26.0–34.0)
MCHC: 31.4 g/dL (ref 30.0–36.0)
MCV: 117.8 fL — ABNORMAL HIGH (ref 78.0–100.0)
Monocytes Absolute: 0.5 10*3/uL (ref 0.1–1.0)
Monocytes Relative: 3 % (ref 3–12)
Neutro Abs: 10.7 10*3/uL — ABNORMAL HIGH (ref 1.7–7.7)
Neutrophils Relative %: 70 % (ref 43–77)
Platelets: 272 10*3/uL (ref 150–400)
RBC: 2.7 MIL/uL — ABNORMAL LOW (ref 4.22–5.81)
RDW: 15.7 % — ABNORMAL HIGH (ref 11.5–15.5)
WBC: 15.3 10*3/uL — ABNORMAL HIGH (ref 4.0–10.5)

## 2014-07-01 MED ORDER — SODIUM CHLORIDE 0.9 % IJ SOLN
10.0000 mL | Freq: Once | INTRAMUSCULAR | Status: AC
  Administered 2014-07-01: 10 mL via INTRAVENOUS

## 2014-07-01 MED ORDER — HEPARIN SOD (PORK) LOCK FLUSH 100 UNIT/ML IV SOLN
500.0000 [IU] | Freq: Once | INTRAVENOUS | Status: AC
  Administered 2014-07-01: 500 [IU] via INTRAVENOUS
  Filled 2014-07-01: qty 5

## 2014-07-01 NOTE — Progress Notes (Signed)
Randall Reyes presented for Portacath access and flush. Proper placement of portacath confirmed by CXR. Portacath located right chest wall accessed with  H 20 needle. No blood return. No resistance. No pain or discomfort with flush.  Portacath flushed with 94ml NS and 500U/36ml Heparin and needle removed intact. Procedure without incident. Patient tolerated procedure well.

## 2014-07-01 NOTE — Progress Notes (Signed)
Labs for cbcd,cmp 

## 2014-07-01 NOTE — Patient Instructions (Signed)
[place] at [facility] Discharge Instructions  RECOMMENDATIONS MADE BY THE CONSULTANT AND ANY TEST RESULTS WILL BE SENT TO YOUR REFERRING PHYSICIAN.  Port flush today as scheduled. Lab work done as ordered. Return as scheduled.  Thank you for choosing [place] at [facility] to provide your oncology and hematology care.  To afford each patient quality time with our provider, please arrive at least 15 minutes before your scheduled appointment time.    You need to re-schedule your appointment should you arrive 10 or more minutes late.  We strive to give you quality time with our providers, and arriving late affects you and other patients whose appointments are after yours.  Also, if you no show three or more times for appointments you may be dismissed from the clinic at the providers discretion.     Again, thank you for choosing [facility].  Our hope is that these requests will decrease the amount of time that you wait before being seen by our physicians.       _____________________________________________________________  Should you have questions after your visit to [facility], please contact our office at [phone] between the hours of 8:30 a.m. and 4:30 p.m.  Voicemails left after 4:30 p.m. will not be returned until the following business day.  For prescription refill requests, have your pharmacy contact our office.

## 2014-07-05 ENCOUNTER — Encounter (HOSPITAL_COMMUNITY): Payer: Self-pay | Admitting: Hematology & Oncology

## 2014-07-05 ENCOUNTER — Encounter (HOSPITAL_BASED_OUTPATIENT_CLINIC_OR_DEPARTMENT_OTHER): Payer: Medicare Other | Admitting: Hematology & Oncology

## 2014-07-05 VITALS — BP 89/63 | HR 106 | Temp 97.8°F | Resp 16 | Wt 129.5 lb

## 2014-07-05 DIAGNOSIS — C9110 Chronic lymphocytic leukemia of B-cell type not having achieved remission: Secondary | ICD-10-CM

## 2014-07-05 NOTE — Assessment & Plan Note (Signed)
Pleasant 79 year old male who is currently on ibrutinib 120 mg for CLL. He has developed significant fissuring around the fingernails and sore and red feet. Liver functions are markedly elevated. Currently I feel his liver function abnormalities are drug related and I have advised him to hold his medication. I will see him back in one week at which time we will repeat a CMP. In addition we will reexamine his hands and feet. I do not see his specific skin toxicity listed as a side effect of his chemotherapy. I discussed my concerns with the patient and his wife they did agree to hold the medication and see [place] back next week.

## 2014-07-05 NOTE — Patient Instructions (Addendum)
..  [place] at [facility] Discharge Instructions  RECOMMENDATIONS MADE BY THE CONSULTANT AND ANY TEST RESULTS WILL BE SENT TO YOUR REFERRING PHYSICIAN.  Hold imbruvica for one week!  Do not take any of this med until we tell you to restart. You will return next Tuesday and see [name] and we will recheck labs at that time  Use udder cream on your hands and feet  Thank you for choosing [place] at [facility] to provide your oncology and hematology care.  To afford each patient quality time with our provider, please arrive at least 15 minutes before your scheduled appointment time.    You need to re-schedule your appointment should you arrive 10 or more minutes late.  We strive to give you quality time with our providers, and arriving late affects you and other patients whose appointments are after yours.  Also, if you no show three or more times for appointments you may be dismissed from the clinic at the providers discretion.     Again, thank you for choosing [facility].  Our hope is that these requests will decrease the amount of time that you wait before being seen by our physicians.       _____________________________________________________________  Should you have questions after your visit to [facility], please contact our office at [phone] between the hours of 8:30 a.m. and 4:30 p.m.  Voicemails left after 4:30 p.m. will not be returned until the following business day.  For prescription refill requests, have your pharmacy contact our office.

## 2014-07-05 NOTE — Progress Notes (Signed)
[name], MD [place] [address]  CLL, trisomy 12 Leukocytoclasic Vasculitis Herpes Zoster  CURRENT THERAPY: Ibrutinib 420mg  daily  INTERVAL HISTORY: Randall Reyes 79 y.o. male returns for follow-up of his CLL. He has developed cracking and the edges of his fingers and around his fingernails. He has recently seen his primary care physician who is advised him to soak them and apply antibiotic ointment. He also complains of pain in his feet, redness, and peeling. His wife states his appetite is poor he does not eat very well and she has a difficult time getting him to eat.   MEDICAL HISTORY: Past Medical History  Diagnosis Date  . Diabetes mellitus   . Leukemia 6.22.2012    PER PATIENT  . Hypertension   . Ulcer   . Anemia   . CLL (chronic lymphocytic leukemia) 01/24/2011  . Melanoma in situ 01/24/2011  . DM (diabetes mellitus) 01/24/2011  . Mild obesity 01/24/2011  . Eczema 01/24/2011  . Port catheter in place 05/02/2012  . Shingles   . History of pneumonia 04/2014    has CLL (chronic lymphocytic leukemia); Melanoma in situ; DM (diabetes mellitus); Mild obesity; Eczema; Port catheter in place; Pneumonia; Sepsis due to pneumonia; and Leukocytosis on his problem list.      CLL (chronic lymphocytic leukemia)   02/14/2005 Procedure Peripheral flow cytometry- CLL (without 11q or 17 p deletion) presenting with stage 0 disease   10/25/2008 Progression Increasing WBC and symptoms suggestive of progressive disease   11/09/2008 - 01/07/2009 Chemotherapy Rituxan, fludarabine, cytoxan x 3 cycles with the discontinuation of fludarabine and cytoxan due to severe pancytopenia requiring hospitalization.   01/10/2009 Adverse Reaction Severe pancytopenia and adverse side effects   02/01/2009 - 04/01/2009 Chemotherapy Rituxan every 21 days x 3 cycles   04/02/2009 Remission Stable disease.  Chemotherapy holiday started   06/22/2013 - 06/29/2013 Chemotherapy Imbruvica  daily   06/29/2013 Adverse Reaction Large right posterior leg (popliteal) ecchymosis/violaceous rash.  Likely Imbruvica-induced   07/31/2013 - 10/12/2013 Chemotherapy Zydelig (idelalisib) 150 mg BID monotherapy   10/12/2013 Adverse Reaction Transaminitis, suspected to be secondary to [place].  Dose decreased.   10/12/2013 - 11/09/2013 Chemotherapy Zydelig (idelalisib) 150 mg daily.   11/09/2013 Adverse Reaction Worsening transaminitis.  Zydelig on HOLD   11/09/2013 Treatment Plan Change Hold Zydelig   12/28/2013 Adverse Reaction Herpes Zoster outbreak.  Partial response with Famvir x 1 week, switched to acyclovir.  Progression over 1-2 weeks with development of keratoconjunctivitis bilaterally.  VZ Immune Globulin 625 mg IM given on 01/13/14.   02/08/2014 Treatment Plan Change Ibrutinib restarted 140 mg daily for one week followed by 280 mg daily followed by 420 mg daily with prophylactic acyclovir.   02/08/2014 -  Chemotherapy Ibrutinib 420 mg daily   04/13/2014 [facility] Admission Right lower lobe pneumonia     is allergic to aspirin and ibuprofen.  Mr. Gervacio had no medications administered during this visit.  SURGICAL HISTORY: Past Surgical History  Procedure Laterality Date  . Hernia repair  2001  . Bleeding ulcer    . Portacath placement  2008  . Cataract extraction w/phaco  05/05/2012    Procedure: CATARACT EXTRACTION PHACO AND INTRAOCULAR LENS PLACEMENT (IOC);  Surgeon: Tonny Branch, MD;  Location: [facility];  Service: Ophthalmology;  Laterality: Right;  CDE:13.25  . Cataract extraction w/phaco  05/15/2012    Procedure: CATARACT EXTRACTION PHACO AND INTRAOCULAR LENS PLACEMENT (IOC);  Surgeon: Tonny Branch, MD;  Location: [facility];  Service: Ophthalmology;  Laterality: Left;  CDE:  12.32    SOCIAL HISTORY: History   Social History  . Marital Status: Married    Spouse Name: N/A  . Number of Children: N/A  . Years of Education: N/A   Occupational History  . Not on file.   Social History Main  Topics  . Smoking status: Former Research scientist (life sciences)  . Smokeless tobacco: Former Systems developer    Quit date: 04/25/1967  . Alcohol Use: No  . Drug Use: No  . Sexual Activity: Not on file   Other Topics Concern  . Not on file   Social History Narrative    FAMILY HISTORY: Family History  Problem Relation Age of Onset  . Diabetes Father     Review of Systems  Constitutional: Positive for malaise/fatigue. Negative for fever, chills and weight loss.  HENT: Negative for congestion, hearing loss, nosebleeds, sore throat and tinnitus.   Eyes: Negative for blurred vision, double vision, pain and discharge.  Respiratory: Negative for cough, hemoptysis, sputum production, shortness of breath and wheezing.   Cardiovascular: Negative for chest pain, palpitations, claudication, leg swelling and PND.  Gastrointestinal: Negative for heartburn, nausea, vomiting, abdominal pain, diarrhea, constipation, blood in stool and melena.       Decreased appetite  Genitourinary: Negative for dysuria, urgency, frequency and hematuria.  Musculoskeletal: Negative for myalgias, joint pain and falls.  Skin: Negative for itching and rash.  Neurological: Negative for dizziness, tingling, tremors, sensory change, speech change, focal weakness, seizures, loss of consciousness, weakness and headaches.  Endo/Heme/Allergies: Does not bruise/bleed easily.  Psychiatric/Behavioral: Negative for depression, suicidal ideas, memory loss and substance abuse. The patient is not nervous/anxious and does not have insomnia.     PHYSICAL EXAMINATION  ECOG PERFORMANCE STATUS: 1 - Symptomatic but completely ambulatory  Filed Vitals:   07/05/14 1059  BP: 89/63  Pulse: 106  Temp: 97.8 F (36.6 C)  Resp: 16    Physical Exam  Constitutional: He is oriented to person, place, and time and well-developed, well-nourished, and in no distress.  HENT:  Head: Normocephalic and atraumatic.  Nose: Nose normal.  Mouth/Throat: Oropharynx is clear and  moist. No oropharyngeal exudate.  Eyes: Conjunctivae and EOM are normal. Pupils are equal, round, and reactive to light. Right eye exhibits no discharge. Left eye exhibits no discharge. No scleral icterus.  Neck: Normal range of motion. Neck supple. No tracheal deviation present. No thyromegaly present.  Cardiovascular: Normal rate, regular rhythm and normal heart sounds.  Exam reveals no gallop and no friction rub.   No murmur heard. Pulmonary/Chest: Effort normal and breath sounds normal. He has no wheezes. He has no rales.  Abdominal: Soft. Bowel sounds are normal. He exhibits no distension and no mass. There is no tenderness. There is no rebound and no guarding.  Musculoskeletal: Normal range of motion. He exhibits no edema.  Lymphadenopathy:    He has no cervical adenopathy.  Neurological: He is alert and oriented to person, place, and time. He has normal reflexes. No cranial nerve deficit. Gait normal. Coordination normal.  Skin: Skin is warm and dry. No rash noted.     Psychiatric: Mood, memory, affect and judgment normal.  Nursing note and vitals reviewed.   LABORATORY DATA:  CBC    Component Value Date/Time   WBC 15.3* 07/01/2014 1316   RBC 2.70* 07/01/2014 1316   RBC 2.82* 05/21/2014 1422   HGB 10.0* 07/01/2014 1316   HCT 31.8* 07/01/2014 1316   PLT 272 07/01/2014  1316   MCV 117.8* 07/01/2014 1316   MCH 37.0* 07/01/2014 1316   MCHC 31.4 07/01/2014 1316   RDW 15.7* 07/01/2014 1316   LYMPHSABS 4.1* 07/01/2014 1316   MONOABS 0.5 07/01/2014 1316   EOSABS 0.0 07/01/2014 1316   BASOSABS 0.0 07/01/2014 1316   CMP     Component Value Date/Time   NA 134* 07/01/2014 1316   K 4.3 07/01/2014 1316   CL 97 07/01/2014 1316   CO2 30 07/01/2014 1316   GLUCOSE 232* 07/01/2014 1316   BUN 30* 07/01/2014 1316   CREATININE 1.94* 07/01/2014 1316   CALCIUM 8.4 07/01/2014 1316   PROT 6.5 07/01/2014 1316   ALBUMIN 2.9* 07/01/2014 1316   AST 217* 07/01/2014 1316   ALT 136*  07/01/2014 1316   ALKPHOS 160* 07/01/2014 1316   BILITOT 0.6 07/01/2014 1316   GFRNONAA 32* 07/01/2014 1316   GFRAA 37* 07/01/2014 1316      ASSESSMENT and THERAPY PLAN:    CLL (chronic lymphocytic leukemia) Pleasant 79 year old male who is currently on ibrutinib 120 mg for CLL. He has developed significant fissuring around the fingernails and sore and red feet. Liver functions are markedly elevated. Currently I feel his liver function abnormalities are drug related and I have advised him to hold his medication. I will see him back in one week at which time we will repeat a CMP. In addition we will reexamine his hands and feet. I do not see his specific skin toxicity listed as a side effect of his chemotherapy. I discussed my concerns with the patient and his wife they did agree to hold the medication and see [place] back next week.   All questions were answered. The patient knows to call the clinic with any problems, questions or concerns. We can certainly see the patient much sooner if necessary.    Molli Hazard 07/05/2014

## 2014-07-12 NOTE — Assessment & Plan Note (Addendum)
Pleasant 79 year old male who is currently holding ibrutinib 420 mg for CLL. He has developed significant fissuring around the fingernails and fissuring of feet bilaterally. Liver functions are markedly improved, but are around 3x ULN.  Will hold therapy for another week to allow time for continued improvement in Liver Enzymes.  Suspect we will be restarting Ibrutinib at a reduced dose (280 mg daily).  Labs in 1 week: CBC diff, CMET.  Return in 1 week for follow-up.

## 2014-07-12 NOTE — Progress Notes (Signed)
Randall Hampshire, MD [place] [address]  CLL (chronic lymphocytic leukemia) - Plan: CBC with Differential, Comprehensive metabolic panel  URI (upper respiratory infection) - Plan: azithromycin (ZITHROMAX) 250 MG tablet  CURRENT THERAPY: Ibrutinib 420mg  daily on hold due to fissuring of hands and sore red feet.  INTERVAL HISTORY: Randall Reyes 79 y.o. male returns for followup of CLL.      CLL (chronic lymphocytic leukemia)   02/14/2005 Procedure Peripheral flow cytometry- CLL (without 11q or 17 p deletion) presenting with stage 0 disease   10/25/2008 Progression Increasing WBC and symptoms suggestive of progressive disease   11/09/2008 - 01/07/2009 Chemotherapy Rituxan, fludarabine, cytoxan x 3 cycles with the discontinuation of fludarabine and cytoxan due to severe pancytopenia requiring hospitalization.   01/10/2009 Adverse Reaction Severe pancytopenia and adverse side effects   02/01/2009 - 04/01/2009 Chemotherapy Rituxan every 21 days x 3 cycles   04/02/2009 Remission Stable disease.  Chemotherapy holiday started   06/22/2013 - 06/29/2013 Chemotherapy Imbruvica daily   06/29/2013 Adverse Reaction Large right posterior leg (popliteal) ecchymosis/violaceous rash.  Likely Imbruvica-induced   07/31/2013 - 10/12/2013 Chemotherapy Zydelig (idelalisib) 150 mg BID monotherapy   10/12/2013 Adverse Reaction Transaminitis, suspected to be secondary to [place].  Dose decreased.   10/12/2013 - 11/09/2013 Chemotherapy Zydelig (idelalisib) 150 mg daily.   11/09/2013 Adverse Reaction Worsening transaminitis.  Zydelig on HOLD   11/09/2013 Treatment Plan Change Hold Zydelig   12/28/2013 Adverse Reaction Herpes Zoster outbreak.  Partial response with Famvir x 1 week, switched to acyclovir.  Progression over 1-2 weeks with development of keratoconjunctivitis bilaterally.  VZ Immune Globulin 625 mg IM given on 01/13/14.   02/08/2014 Treatment Plan Change Ibrutinib restarted 140 mg daily  for one week followed by 280 mg daily followed by 420 mg daily with prophylactic acyclovir.   02/08/2014 - 07/05/2014 Chemotherapy Ibrutinib 420 mg daily   106/23/2015 - 04/17/2014 Hospital Admission Right lower lobe pneumonia   07/05/2014 Adverse Reaction Fissuring of fingertips and sore, red feet with transaminitis   07/05/2014 Treatment Plan Change Hold Ibrutinib    I personally reviewed and went over laboratory results with the patient.  The results are noted within this dictation. His liver enzymes are improved, and are near 3xULN, but much improved compared to 2/18.  His hand and sole of foot fissuring is improved the patient and his wife report.  Pictures from today follow below in the physical exam section.  He is treating these symptomatically.  He notes that it is significantly less painful to walk. Fissuring is abnormal and is not listed as a potential adverse reaction to this medication.  He notes a cough productive of yellow sputum.  He notes this has been ongoing since December 2015 and has not resolved.  He denies any other symptoms including sore throat, nasal congestion or discharge, headache, etc.  Hematologically, he otherwise denies any complaints and ROS questioning is negative.   Past Medical History  Diagnosis Date  . Diabetes mellitus   . Leukemia 6.22.2012    PER PATIENT  . Hypertension   . Ulcer   . Anemia   . CLL (chronic lymphocytic leukemia) 01/24/2011  . Melanoma in situ 01/24/2011  . DM (diabetes mellitus) 01/24/2011  . Mild obesity 01/24/2011  . Eczema 01/24/2011  . Port catheter in place 05/02/2012  . Shingles   . History of pneumonia 04/2014    has CLL (chronic lymphocytic leukemia); Melanoma in situ; DM (diabetes mellitus);  Mild obesity; Eczema; Port catheter in place; Pneumonia; Sepsis due to pneumonia; and Leukocytosis on his problem list.     is allergic to aspirin and ibuprofen.  [name] had no medications administered during this visit.  Past  Surgical History  Procedure Laterality Date  . Hernia repair  2001  . Bleeding ulcer    . Portacath placement  2008  . Cataract extraction w/phaco  05/05/2012    Procedure: CATARACT EXTRACTION PHACO AND INTRAOCULAR LENS PLACEMENT (IOC);  Surgeon: Tonny Branch, MD;  Location: [facility];  Service: Ophthalmology;  Laterality: Right;  CDE:13.25  . Cataract extraction w/phaco  05/15/2012    Procedure: CATARACT EXTRACTION PHACO AND INTRAOCULAR LENS PLACEMENT (IOC);  Surgeon: Tonny Branch, MD;  Location: [facility];  Service: Ophthalmology;  Laterality: Left;  CDE:  12.32    Denies any headaches, dizziness, double vision, fevers, chills, night sweats, nausea, vomiting, diarrhea, constipation, chest pain, heart palpitations, shortness of breath, blood in stool, black tarry stool, urinary pain, urinary burning, urinary frequency, hematuria.   PHYSICAL EXAMINATION  ECOG PERFORMANCE STATUS: 1 - Symptomatic but completely ambulatory  Filed Vitals:   07/13/14 0925  BP: 128/76  Pulse: 118  Temp: 98.1 F (36.7 C)  Resp: 18    GENERAL:alert, no distress, well nourished, well developed, comfortable, cooperative, smiling and accompanied by his wife SKIN: skin color, texture, turgor are normal, positive JME:QASTMHD of fingers and toes HEAD: Normocephalic, No masses, lesions, tenderness or abnormalities EYES: normal, PERRLA, EOMI, Conjunctiva are pink and non-injected EARS: External ears normal OROPHARYNX:lips, buccal mucosa, and tongue normal and mucous membranes are moist  NECK: supple, thyroid normal size, non-tender, without nodularity, no stridor, non-tender, trachea midline, no neck adenopathy noted LYMPH:  not examined BREAST:not examined LUNGS: not examined HEART: not examined ABDOMEN:abdomen soft and normal bowel sounds BACK: Back symmetric, no curvature. EXTREMITIES:less then 2 second capillary refill, no cyanosis, positive findings:  edema Bilateral 1+ pitting edema pre-tibially. Fissuring of soles  of feet bilaterally and fingertips as illustrated below:    Left thumb    Left #4 finger    Right #1 finger  NEURO: alert & oriented x 3 with fluent speech, no focal motor/sensory deficits   LABORATORY DATA: CBC    Component Value Date/Time   WBC 15.3* 07/01/2014 1316   RBC 2.70* 07/01/2014 1316   RBC 2.82* 05/21/2014 1422   HGB 10.0* 07/01/2014 1316   HCT 31.8* 07/01/2014 1316   PLT 272 07/01/2014 1316   MCV 117.8* 07/01/2014 1316   MCH 37.0* 07/01/2014 1316   MCHC 31.4 07/01/2014 1316   RDW 15.7* 07/01/2014 1316   LYMPHSABS 4.1* 07/01/2014 1316   MONOABS 0.5 07/01/2014 1316   EOSABS 0.0 07/01/2014 1316   BASOSABS 0.0 07/01/2014 1316      Chemistry      Component Value Date/Time   NA 138 07/13/2014 0917   K 3.6 07/13/2014 0917   CL 98 07/13/2014 0917   CO2 33* 07/13/2014 0917   BUN 26* 07/13/2014 0917   CREATININE 1.52* 07/13/2014 0917      Component Value Date/Time   CALCIUM 8.4 07/13/2014 0917   ALKPHOS 157* 07/13/2014 0917   AST 94* 07/13/2014 0917   ALT 68* 07/13/2014 0917   BILITOT 0.4 07/13/2014 0917       ASSESSMENT AND PLAN:  CLL (chronic lymphocytic leukemia) Pleasant 79 year old male who is currently holding ibrutinib 420 mg for CLL. He has developed significant fissuring around the fingernails and fissuring of feet bilaterally. Liver functions  are markedly improved, but are around 3x ULN.  Will hold therapy for another week to allow time for continued improvement in Liver Enzymes.  Suspect we will be restarting Ibrutinib at a reduced dose (280 mg daily).  Labs in 1 week: CBC diff, CMET.  Return in 1 week for follow-up.   THERAPY PLAN:  Hold Ibrutinib x 1 week to allow liver enzymes to improve.  If AST and ALT improve in 1 week, will restart Ibrutinib at a reduced dose of 280 mg daily.  All questions were answered. The patient knows to call the clinic with any problems, questions or concerns. We can certainly see the patient much sooner if  necessary.  Patient and plan discussed with Dr. Ancil Linsey and she is in agreement with the aforementioned.   This note is electronically signed by: Robynn Pane 07/13/2014 11:10 AM

## 2014-07-13 ENCOUNTER — Encounter (HOSPITAL_COMMUNITY): Payer: Self-pay | Admitting: Oncology

## 2014-07-13 ENCOUNTER — Encounter (HOSPITAL_COMMUNITY): Payer: Medicare Other | Attending: Oncology | Admitting: Oncology

## 2014-07-13 ENCOUNTER — Encounter (HOSPITAL_BASED_OUTPATIENT_CLINIC_OR_DEPARTMENT_OTHER): Payer: Medicare Other

## 2014-07-13 VITALS — BP 128/76 | HR 118 | Temp 98.1°F | Resp 18 | Wt 131.8 lb

## 2014-07-13 DIAGNOSIS — M310 Hypersensitivity angiitis: Secondary | ICD-10-CM | POA: Diagnosis present

## 2014-07-13 DIAGNOSIS — L989 Disorder of the skin and subcutaneous tissue, unspecified: Secondary | ICD-10-CM

## 2014-07-13 DIAGNOSIS — C9110 Chronic lymphocytic leukemia of B-cell type not having achieved remission: Secondary | ICD-10-CM | POA: Insufficient documentation

## 2014-07-13 DIAGNOSIS — J069 Acute upper respiratory infection, unspecified: Secondary | ICD-10-CM

## 2014-07-13 LAB — COMPREHENSIVE METABOLIC PANEL WITH GFR
ALT: 68 U/L — ABNORMAL HIGH (ref 0–53)
AST: 94 U/L — ABNORMAL HIGH (ref 0–37)
Albumin: 2.5 g/dL — ABNORMAL LOW (ref 3.5–5.2)
Alkaline Phosphatase: 157 U/L — ABNORMAL HIGH (ref 39–117)
Anion gap: 7 (ref 5–15)
BUN: 26 mg/dL — ABNORMAL HIGH (ref 6–23)
CO2: 33 mmol/L — ABNORMAL HIGH (ref 19–32)
Calcium: 8.4 mg/dL (ref 8.4–10.5)
Chloride: 98 mmol/L (ref 96–112)
Creatinine, Ser: 1.52 mg/dL — ABNORMAL HIGH (ref 0.50–1.35)
GFR calc Af Amer: 49 mL/min — ABNORMAL LOW
GFR calc non Af Amer: 42 mL/min — ABNORMAL LOW
Glucose, Bld: 100 mg/dL — ABNORMAL HIGH (ref 70–99)
Potassium: 3.6 mmol/L (ref 3.5–5.1)
Sodium: 138 mmol/L (ref 135–145)
Total Bilirubin: 0.4 mg/dL (ref 0.3–1.2)
Total Protein: 6.7 g/dL (ref 6.0–8.3)

## 2014-07-13 MED ORDER — AZITHROMYCIN 250 MG PO TABS: ORAL_TABLET | ORAL | Status: DC

## 2014-07-13 NOTE — Patient Instructions (Signed)
[place] [facility] Discharge Instructions  RECOMMENDATIONS MADE BY THE CONSULTANT AND ANY TEST RESULTS WILL BE SENT TO YOUR REFERRING PHYSICIAN.  Exam and discussion by Robynn Pane, PA-C Continue to hold the chemo pill Z-pak prescribed - take as directed You need to get up and move about.  Follow-up March 10 with labs and office visit.  Thank you for choosing [place] at [facility] to provide your oncology and hematology care.  To afford each patient quality time with our provider, please arrive at least 15 minutes before your scheduled appointment time.    You need to re-schedule your appointment should you arrive 10 or more minutes late.  We strive to give you quality time with our providers, and arriving late affects you and other patients whose appointments are after yours.  Also, if you no show three or more times for appointments you may be dismissed from the clinic at the providers discretion.     Again, thank you for choosing [facility].  Our hope is that these requests will decrease the amount of time that you wait before being seen by our physicians.       _____________________________________________________________  Should you have questions after your visit to [facility], please contact our office at [phone] between the hours of 8:30 a.m. and 4:30 p.m.  Voicemails left after 4:30 p.m. will not be returned until the following business day.  For prescription refill requests, have your pharmacy contact our office.

## 2014-07-13 NOTE — Progress Notes (Signed)
LABS FOR CMP  

## 2014-07-22 ENCOUNTER — Encounter (HOSPITAL_COMMUNITY): Payer: Self-pay | Admitting: Oncology

## 2014-07-22 ENCOUNTER — Encounter (HOSPITAL_BASED_OUTPATIENT_CLINIC_OR_DEPARTMENT_OTHER): Payer: Medicare Other | Admitting: Oncology

## 2014-07-22 ENCOUNTER — Encounter (HOSPITAL_BASED_OUTPATIENT_CLINIC_OR_DEPARTMENT_OTHER): Payer: Medicare Other

## 2014-07-22 VITALS — BP 124/79 | HR 115 | Temp 97.6°F | Resp 20 | Wt 129.7 lb

## 2014-07-22 DIAGNOSIS — C9110 Chronic lymphocytic leukemia of B-cell type not having achieved remission: Secondary | ICD-10-CM

## 2014-07-22 LAB — CBC WITH DIFFERENTIAL/PLATELET
Basophils Absolute: 0 10*3/uL (ref 0.0–0.1)
Basophils Relative: 0 % (ref 0–1)
EOS ABS: 0.1 10*3/uL (ref 0.0–0.7)
EOS PCT: 0 % (ref 0–5)
HCT: 32.7 % — ABNORMAL LOW (ref 39.0–52.0)
HEMOGLOBIN: 10.4 g/dL — AB (ref 13.0–17.0)
LYMPHS ABS: 7.8 10*3/uL — AB (ref 0.7–4.0)
Lymphocytes Relative: 48 % — ABNORMAL HIGH (ref 12–46)
MCH: 36.2 pg — AB (ref 26.0–34.0)
MCHC: 31.8 g/dL (ref 30.0–36.0)
MCV: 113.9 fL — AB (ref 78.0–100.0)
MONO ABS: 1 10*3/uL (ref 0.1–1.0)
Monocytes Relative: 6 % (ref 3–12)
NEUTROS PCT: 45 % (ref 43–77)
Neutro Abs: 7.3 10*3/uL (ref 1.7–7.7)
Platelets: 356 10*3/uL (ref 150–400)
RBC: 2.87 MIL/uL — ABNORMAL LOW (ref 4.22–5.81)
RDW: 15.6 % — ABNORMAL HIGH (ref 11.5–15.5)
WBC: 16.3 10*3/uL — ABNORMAL HIGH (ref 4.0–10.5)

## 2014-07-22 LAB — COMPREHENSIVE METABOLIC PANEL
ALT: 52 U/L (ref 0–53)
ANION GAP: 9 (ref 5–15)
AST: 74 U/L — ABNORMAL HIGH (ref 0–37)
Albumin: 2.7 g/dL — ABNORMAL LOW (ref 3.5–5.2)
Alkaline Phosphatase: 214 U/L — ABNORMAL HIGH (ref 39–117)
BUN: 23 mg/dL (ref 6–23)
CALCIUM: 8.9 mg/dL (ref 8.4–10.5)
CO2: 31 mmol/L (ref 19–32)
Chloride: 97 mmol/L (ref 96–112)
Creatinine, Ser: 1.76 mg/dL — ABNORMAL HIGH (ref 0.50–1.35)
GFR, EST AFRICAN AMERICAN: 41 mL/min — AB (ref >90)
GFR, EST NON AFRICAN AMERICAN: 36 mL/min — AB (ref >90)
Glucose, Bld: 99 mg/dL (ref 70–99)
Potassium: 4.1 mmol/L (ref 3.5–5.1)
SODIUM: 137 mmol/L (ref 135–145)
Total Bilirubin: 0.5 mg/dL (ref 0.3–1.2)
Total Protein: 7.4 g/dL (ref 6.0–8.3)

## 2014-07-22 MED ORDER — IBRUTINIB 140 MG PO CAPS: 280.0000 mg | ORAL_CAPSULE | Freq: Every day | ORAL | Status: DC

## 2014-07-22 NOTE — Progress Notes (Signed)
Randall Hampshire, MD [place] [address]  CLL (chronic lymphocytic leukemia) - Plan: ibrutinib (IMBRUVICA) 140 MG capsul, CBC with Differential, Comprehensive metabolic panel  CURRENT THERAPY: Ibrutinib 420mg  daily on hold due to fissuring of hands and sore red feet.  INTERVAL HISTORY: Randall Reyes 79 y.o. male returns for followup of CLL.      CLL (chronic lymphocytic leukemia)   02/14/2005 Procedure Peripheral flow cytometry- CLL (without 11q or 17 p deletion) presenting with stage 0 disease   10/25/2008 Progression Increasing WBC and symptoms suggestive of progressive disease   11/09/2008 - 01/07/2009 Chemotherapy Rituxan, fludarabine, cytoxan x 3 cycles with the discontinuation of fludarabine and cytoxan due to severe pancytopenia requiring hospitalization.   01/10/2009 Adverse Reaction Severe pancytopenia and adverse side effects   02/01/2009 - 04/01/2009 Chemotherapy Rituxan every 21 days x 3 cycles   04/02/2009 Remission Stable disease.  Chemotherapy holiday started   06/22/2013 - 06/29/2013 Chemotherapy Imbruvica daily   06/29/2013 Adverse Reaction Large right posterior leg (popliteal) ecchymosis/violaceous rash.  Likely Imbruvica-induced   07/31/2013 - 10/12/2013 Chemotherapy Zydelig (idelalisib) 150 mg BID monotherapy   10/12/2013 Adverse Reaction Transaminitis, suspected to be secondary to [place].  Dose decreased.   10/12/2013 - 11/09/2013 Chemotherapy Zydelig (idelalisib) 150 mg daily.   11/09/2013 Adverse Reaction Worsening transaminitis.  Zydelig on HOLD   11/09/2013 Treatment Plan Change Hold Zydelig   12/28/2013 Adverse Reaction Herpes Zoster outbreak.  Partial response with Famvir x 1 week, switched to acyclovir.  Progression over 1-2 weeks with development of keratoconjunctivitis bilaterally.  VZ Immune Globulin 625 mg IM given on 01/13/14.   02/08/2014 Treatment Plan Change Ibrutinib restarted 140 mg daily for one week followed by 280 mg daily followed by  420 mg daily with prophylactic acyclovir.   02/08/2014 - 07/05/2014 Chemotherapy Ibrutinib 420 mg daily   1June 27, 202015 - 04/17/2014 Hospital Admission Right lower lobe pneumonia   07/05/2014 Adverse Reaction Fissuring of fingertips and sore, red feet with transaminitis   07/05/2014 Treatment Plan Change Hold Ibrutinib   07/22/2014 -  Chemotherapy Ibrutinib restarted at 280 mg daily.   His finger fissuring is much improved with some resolution.  His sole of feet fissuring is improving as well.   I personally reviewed and went over radiographic studies with the patient.  The results are noted within this dictation.  Liver enzymes are improved.  He notes increased flatulence without change in bowel habits.  He has been off Imbruvica for 2-3 weeks so it is not Imbruvica-induced.  He denies any changes in his dietary intake.  Hematologically, he otherwise denies any complaints and ROS questioning is negative.   Past Medical History  Diagnosis Date  . Diabetes mellitus   . Leukemia 6.22.2012    PER PATIENT  . Hypertension   . Ulcer   . Anemia   . CLL (chronic lymphocytic leukemia) 01/24/2011  . Melanoma in situ 01/24/2011  . DM (diabetes mellitus) 01/24/2011  . Mild obesity 01/24/2011  . Eczema 01/24/2011  . Port catheter in place 05/02/2012  . Shingles   . History of pneumonia 04/2014    has CLL (chronic lymphocytic leukemia); Melanoma in situ; DM (diabetes mellitus); Mild obesity; Eczema; Port catheter in place; Pneumonia; Sepsis due to pneumonia; and Leukocytosis on his problem list.     is allergic to aspirin and ibuprofen.  Mr. Severe had no medications administered during this visit.  Past Surgical History  Procedure Laterality Date  . Hernia  repair  2001  . Bleeding ulcer    . Portacath placement  2008  . Cataract extraction w/phaco  05/05/2012    Procedure: CATARACT EXTRACTION PHACO AND INTRAOCULAR LENS PLACEMENT (IOC);  Surgeon: Tonny Branch, MD;  Location: [facility];  Service:  Ophthalmology;  Laterality: Right;  CDE:13.25  . Cataract extraction w/phaco  05/15/2012    Procedure: CATARACT EXTRACTION PHACO AND INTRAOCULAR LENS PLACEMENT (IOC);  Surgeon: Tonny Branch, MD;  Location: [facility];  Service: Ophthalmology;  Laterality: Left;  CDE:  12.32    Denies any headaches, dizziness, double vision, fevers, chills, night sweats, nausea, vomiting, diarrhea, constipation, chest pain, heart palpitations, shortness of breath, blood in stool, black tarry stool, urinary pain, urinary burning, urinary frequency, hematuria.   PHYSICAL EXAMINATION  ECOG PERFORMANCE STATUS: 1 - Symptomatic but completely ambulatory  Filed Vitals:   07/22/14 0900  BP: 124/79  Pulse: 115  Temp: 97.6 F (36.4 C)  Resp: 20    GENERAL:alert, no distress, well nourished, well developed, comfortable, cooperative, smiling and accompanied by his wife SKIN: skin color, texture, turgor are normal, positive ZOX:WRUEAVW of fingers and toes HEAD: Normocephalic, No masses, lesions, tenderness or abnormalities EYES: normal, PERRLA, EOMI, Conjunctiva are pink and non-injected EARS: External ears normal OROPHARYNX:lips, buccal mucosa, and tongue normal and mucous membranes are moist  NECK: supple, thyroid normal size, non-tender, without nodularity, no stridor, non-tender, trachea midline, no neck adenopathy noted LYMPH:  not examined BREAST:not examined LUNGS: not examined HEART: RRR ABDOMEN:abdomen soft and normal bowel sounds and nontender. BACK: Back symmetric, no curvature. EXTREMITIES:less then 2 second capillary refill, no cyanosis, positive findings:  edema Bilateral 1+ pitting edema pre-tibially. Fissuring of soles of feet bilaterally and fingertips as illustrated below, but much improved: NEURO: alert & oriented x 3 with fluent speech, no focal motor/sensory deficits    Left thumb      Right index finger      Right sole of foot     Right heel.   LABORATORY DATA: CBC      Component Value Date/Time   WBC 16.3* 07/22/2014 0852   RBC 2.87* 07/22/2014 0852   RBC 2.82* 05/21/2014 1422   HGB 10.4* 07/22/2014 0852   HCT 32.7* 07/22/2014 0852   PLT 356 07/22/2014 0852   MCV 113.9* 07/22/2014 0852   MCH 36.2* 07/22/2014 0852   MCHC 31.8 07/22/2014 0852   RDW 15.6* 07/22/2014 0852   LYMPHSABS 7.8* 07/22/2014 0852   MONOABS 1.0 07/22/2014 0852   EOSABS 0.1 07/22/2014 0852   BASOSABS 0.0 07/22/2014 0852      Chemistry      Component Value Date/Time   NA 137 07/22/2014 0852   K 4.1 07/22/2014 0852   CL 97 07/22/2014 0852   CO2 31 07/22/2014 0852   BUN 23 07/22/2014 0852   CREATININE 1.76* 07/22/2014 0852      Component Value Date/Time   CALCIUM 8.9 07/22/2014 0852   ALKPHOS 214* 07/22/2014 0852   AST 74* 07/22/2014 0852   ALT 52 07/22/2014 0852   BILITOT 0.5 07/22/2014 0852       ASSESSMENT AND PLAN:  CLL (chronic lymphocytic leukemia) Pleasant 79 year old male who is S/P a 3 week holding of ibrutinib 420 mg for CLL due to significant fissuring around the fingernails and fissuring of feet bilaterally with a transaminitis. Liver functions are markedly improved, and below 3x ULN.  Will restart therapy at a reduced dose of 280 mg daily.  A new Rx is escribed to [place].  Labs in 2 weeks: CBC diff, CMET.  With return appointment.      THERAPY PLAN:  Restart Ibrutinib at a reduced dose of 280 mg daily.  Return appointment in 2 weeks for follow-up and evaluation of tolerability of reduced dose.   All questions were answered. The patient knows to call the clinic with any problems, questions or concerns. We can certainly see the patient much sooner if necessary.  Patient and plan discussed with Dr. Ancil Linsey and she is in agreement with the aforementioned.   This note is electronically signed by: Robynn Pane 07/22/2014 10:09 AM

## 2014-07-22 NOTE — Patient Instructions (Signed)
[name] at [facility]  Discharge Instructions:  You saw Randall Reyes today.  Lab work in 2 weeks.  You will see [name] in 2 weeks.  Lab work in 4 weeks again and see Dr [name] in 4 weeks.  Restart Imbruvica at 2 tablets a day.  Please call the clinic if you have any questions or concerns before then. _______________________________________________________________  Thank you for choosing [place] at [facility] to provide your oncology and hematology care.  To afford each patient quality time with our providers, please arrive at least 15 minutes before your scheduled appointment.  You need to re-schedule your appointment if you arrive 10 or more minutes late.  We strive to give you quality time with our providers, and arriving late affects you and other patients whose appointments are after yours.  Also, if you no show three or more times for appointments you may be dismissed from the clinic.  Again, thank you for choosing [place] at [place] hope is that these requests will allow you access to exceptional care and in a timely manner. _______________________________________________________________  If you have questions after your visit, please contact our office at [phone] between the hours of 8:30 a.m. and 5:00 p.m. Voicemails left after 4:30 p.m. will not be returned until the following business day. _______________________________________________________________  For prescription refill requests, have your pharmacy contact our office. _______________________________________________________________  Recommendations made by the consultant and any test results will be sent to your referring physician. _______________________________________________________________

## 2014-07-22 NOTE — Progress Notes (Signed)
LABS DRAWN

## 2014-07-22 NOTE — Assessment & Plan Note (Addendum)
Pleasant 79 year old male who is S/P a 3 week holding of ibrutinib 420 mg for CLL due to significant fissuring around the fingernails and fissuring of feet bilaterally with a transaminitis. Liver functions are markedly improved, and below 3x ULN.  Will restart therapy at a reduced dose of 280 mg daily.  A new Rx is escribed to [place].  Labs in 2 weeks: CBC diff, CMET.  With return appointment.

## 2014-07-27 ENCOUNTER — Ambulatory Visit (HOSPITAL_COMMUNITY): Payer: Medicare Other | Admitting: Hematology & Oncology

## 2014-07-30 ENCOUNTER — Other Ambulatory Visit (HOSPITAL_COMMUNITY): Payer: Self-pay

## 2014-08-05 ENCOUNTER — Encounter (HOSPITAL_BASED_OUTPATIENT_CLINIC_OR_DEPARTMENT_OTHER): Payer: Medicare Other

## 2014-08-05 ENCOUNTER — Ambulatory Visit (HOSPITAL_COMMUNITY)
Admission: RE | Admit: 2014-08-05 | Discharge: 2014-08-05 | Disposition: A | Discharge status: Home / Self Care | Payer: Medicare Other | Source: Ambulatory Visit | Attending: Oncology | Admitting: Oncology

## 2014-08-05 ENCOUNTER — Encounter (HOSPITAL_COMMUNITY): Payer: Self-pay | Admitting: Oncology

## 2014-08-05 ENCOUNTER — Encounter (HOSPITAL_COMMUNITY): Payer: Medicare Other

## 2014-08-05 ENCOUNTER — Encounter (HOSPITAL_BASED_OUTPATIENT_CLINIC_OR_DEPARTMENT_OTHER): Payer: Medicare Other | Admitting: Oncology

## 2014-08-05 VITALS — BP 80/50 | HR 107 | Temp 98.7°F | Resp 22 | Wt 125.4 lb

## 2014-08-05 DIAGNOSIS — J22 Unspecified acute lower respiratory infection: Secondary | ICD-10-CM | POA: Diagnosis not present

## 2014-08-05 DIAGNOSIS — J069 Acute upper respiratory infection, unspecified: Secondary | ICD-10-CM

## 2014-08-05 DIAGNOSIS — R0602 Shortness of breath: Secondary | ICD-10-CM | POA: Diagnosis not present

## 2014-08-05 DIAGNOSIS — R05 Cough: Secondary | ICD-10-CM

## 2014-08-05 DIAGNOSIS — Z856 Personal history of leukemia: Secondary | ICD-10-CM | POA: Insufficient documentation

## 2014-08-05 DIAGNOSIS — R059 Cough, unspecified: Secondary | ICD-10-CM

## 2014-08-05 DIAGNOSIS — C9110 Chronic lymphocytic leukemia of B-cell type not having achieved remission: Secondary | ICD-10-CM

## 2014-08-05 LAB — CBC WITH DIFFERENTIAL/PLATELET
BASOS PCT: 0 % (ref 0–1)
Basophils Absolute: 0.1 10*3/uL (ref 0.0–0.1)
Eosinophils Absolute: 0.2 10*3/uL (ref 0.0–0.7)
Eosinophils Relative: 1 % (ref 0–5)
HEMATOCRIT: 35.9 % — AB (ref 39.0–52.0)
HEMOGLOBIN: 11.8 g/dL — AB (ref 13.0–17.0)
LYMPHS ABS: 9.4 10*3/uL — AB (ref 0.7–4.0)
LYMPHS PCT: 52 % — AB (ref 12–46)
MCH: 36.6 pg — AB (ref 26.0–34.0)
MCHC: 32.9 g/dL (ref 30.0–36.0)
MCV: 111.5 fL — ABNORMAL HIGH (ref 78.0–100.0)
MONOS PCT: 9 % (ref 3–12)
Monocytes Absolute: 1.6 10*3/uL — ABNORMAL HIGH (ref 0.1–1.0)
NEUTROS ABS: 6.7 10*3/uL (ref 1.7–7.7)
NEUTROS PCT: 37 % — AB (ref 43–77)
Platelets: 353 10*3/uL (ref 150–400)
RBC: 3.22 MIL/uL — ABNORMAL LOW (ref 4.22–5.81)
RDW: 15 % (ref 11.5–15.5)
WBC: 17.9 10*3/uL — ABNORMAL HIGH (ref 4.0–10.5)

## 2014-08-05 LAB — COMPREHENSIVE METABOLIC PANEL
ALK PHOS: 146 U/L — AB (ref 39–117)
ALT: 45 U/L (ref 0–53)
ANION GAP: 9 (ref 5–15)
AST: 37 U/L (ref 0–37)
Albumin: 2.9 g/dL — ABNORMAL LOW (ref 3.5–5.2)
BUN: 35 mg/dL — AB (ref 6–23)
CO2: 24 mmol/L (ref 19–32)
Calcium: 9.1 mg/dL (ref 8.4–10.5)
Chloride: 98 mmol/L (ref 96–112)
Creatinine, Ser: 2.27 mg/dL — ABNORMAL HIGH (ref 0.50–1.35)
GFR calc Af Amer: 30 mL/min — ABNORMAL LOW (ref >90)
GFR calc non Af Amer: 26 mL/min — ABNORMAL LOW (ref >90)
Glucose, Bld: 91 mg/dL (ref 70–99)
Potassium: 5.7 mmol/L — ABNORMAL HIGH (ref 3.5–5.1)
Sodium: 131 mmol/L — ABNORMAL LOW (ref 135–145)
Total Bilirubin: 0.7 mg/dL (ref 0.3–1.2)
Total Protein: 8 g/dL (ref 6.0–8.3)

## 2014-08-05 MED ORDER — AMOXICILLIN-POT CLAVULANATE 875-125 MG PO TABS
1.0000 | ORAL_TABLET | Freq: Two times a day (BID) | ORAL | Status: DC
Start: 2014-08-05 — End: 2014-08-12

## 2014-08-05 MED ORDER — IBRUTINIB 140 MG PO CAPS: 280.0000 mg | ORAL_CAPSULE | Freq: Every day | ORAL | Status: DC

## 2014-08-05 MED ORDER — BENZONATATE 200 MG PO CAPS: 200.0000 mg | ORAL_CAPSULE | Freq: Three times a day (TID) | ORAL | Status: DC | PRN

## 2014-08-05 NOTE — Assessment & Plan Note (Addendum)
Pleasant 79 year old male who is S/P a 3 week holding of ibrutinib 420 mg for CLL due to significant fissuring around the fingernails and fissuring of feet bilaterally with a transaminitis.  Liver functions are markedly improved, and normalized as of today (08/05/2014).  Restarted Ibrutinib at dose reduction of 280 mg daily on 07/22/2014.  Labs today as scheduled. Renal function has progressed suddenly and I suspect it is secondary to poor oral intake.  Will perform a close interval recheck with CBC diff, CMET next week.  URI versus LRI noted today.  He has a productive cough of yellow sputum and nasal discharge that is yellow as well.  He notes SOB without chest pain.  He denies a sudden onset of SOB.  Chest xray today to evaluate for consolidation and to help guide antibiotic treatment.  Tessalon provided today for cough control.  O2 was checked with walking and dropped to 97% compared to 99%.  Other than labs next week, mainly to re-evaluate renal function, will repeat labs in 2 weeks: CBC diff, CMET.  Return in 2 weeks for follow-up.    ADDENDUM: Chest xray is negative for pneumonia.  Augmentin x 7 days escribed to his [facility].

## 2014-08-05 NOTE — Progress Notes (Signed)
Randall Hampshire, MD [name] [address]  CLL (chronic lymphocytic leukemia) - Plan: CBC with Differential, Comprehensive metabolic panel, ibrutinib (IMBRUVICA) 140 MG capsul  Cough - Plan: benzonatate (TESSALON) 200 MG capsule, DG Chest 1 View  URI (upper respiratory infection) - Plan: benzonatate (TESSALON) 200 MG capsule, DG Chest 1 View, amoxicillin-clavulanate (AUGMENTIN) 875-125 MG per tablet  CURRENT THERAPY: Ibrutinib 280 mg daily   INTERVAL HISTORY: Randall Reyes 79 y.o. male returns for followup of CLL.[name]    CLL (chronic lymphocytic leukemia)   02/14/2005 Procedure Peripheral flow cytometry- CLL (without 11q or 17 p deletion) presenting with stage 0 disease   10/25/2008 Progression Increasing WBC and symptoms suggestive of progressive disease   11/09/2008 - 01/07/2009 Chemotherapy Rituxan, fludarabine, cytoxan x 3 cycles with the discontinuation of fludarabine and cytoxan due to severe pancytopenia requiring hospitalization.   01/10/2009 Adverse Reaction Severe pancytopenia and adverse side effects   02/01/2009 - 04/01/2009 Chemotherapy Rituxan every 21 days x 3 cycles   04/02/2009 Remission Stable disease.  Chemotherapy holiday started   06/22/2013 - 06/29/2013 Chemotherapy Imbruvica daily   06/29/2013 Adverse Reaction Large right posterior leg (popliteal) ecchymosis/violaceous rash.  Likely Imbruvica-induced   07/31/2013 - 10/12/2013 Chemotherapy Zydelig (idelalisib) 150 mg BID monotherapy   10/12/2013 Adverse Reaction Transaminitis, suspected to be secondary to [place].  Dose decreased.   10/12/2013 - 11/09/2013 Chemotherapy Zydelig (idelalisib) 150 mg daily.   11/09/2013 Adverse Reaction Worsening transaminitis.  Zydelig on HOLD   11/09/2013 Treatment Plan Change Hold Zydelig   12/28/2013 Adverse Reaction Herpes Zoster outbreak.  Partial response with Famvir x 1 week, switched to acyclovir.  Progression over 1-2 weeks with development of  keratoconjunctivitis bilaterally.  VZ Immune Globulin 625 mg IM given on 01/13/14.   02/08/2014 Treatment Plan Change Ibrutinib restarted 140 mg daily for one week followed by 280 mg daily followed by 420 mg daily with prophylactic acyclovir.   02/08/2014 - 07/05/2014 Chemotherapy Ibrutinib 420 mg daily   1January 17, 202015 - 04/17/2014 Hospital Admission Right lower lobe pneumonia   07/05/2014 Adverse Reaction Fissuring of fingertips and sore, red feet with transaminitis   07/05/2014 Treatment Plan Change Hold Ibrutinib   07/22/2014 -  Chemotherapy Ibrutinib restarted at 280 mg daily.     I personally reviewed and went over laboratory results with the patient.  The results are noted within this dictation.  Liver enzymes are back to normal.  His renal function has increased and I think it is secondary to poor oral intake from URI.  He is encouraged to increase fluids.  Hopefully with treatment of URI, he will feel better and renal function will improve.  I will recheck his renal function next week.  He does not look acutely ill.  He demonstrates signs and symptoms of URI versus LRI.  He notes a cough productive of yellow sputum.  He notes nasal discharge that is yellow as well.  He denies any fevers.  He reports SOB, but denies any chest pain.  He denies a sore throat.  He denies a headache. He is witnessed coughing.  Walking O2 sats are stable.  He is tolerating the reduced dose of Ibrutinib well.  His liver enzymes are back to normal.  Hematologically, he denies any complaints and ROS questioning is otherwise negative.  Past Medical History  Diagnosis Date  . Diabetes mellitus   . Leukemia 6.22.2012    PER PATIENT  . Hypertension   . Ulcer   .  Anemia   . CLL (chronic lymphocytic leukemia) 01/24/2011  . Melanoma in situ 01/24/2011  . DM (diabetes mellitus) 01/24/2011  . Mild obesity 01/24/2011  . Eczema 01/24/2011  . Port catheter in place 05/02/2012  . Shingles   . History of pneumonia 04/2014    has  CLL (chronic lymphocytic leukemia); Melanoma in situ; DM (diabetes mellitus); Mild obesity; Eczema; Port catheter in place; Pneumonia; Sepsis due to pneumonia; and Leukocytosis on his problem list.     is allergic to aspirin and ibuprofen.  [name] had no medications administered during this visit.  Past Surgical History  Procedure Laterality Date  . Hernia repair  2001  . Bleeding ulcer    . Portacath placement  2008  . Cataract extraction w/phaco  05/05/2012    Procedure: CATARACT EXTRACTION PHACO AND INTRAOCULAR LENS PLACEMENT (IOC);  Surgeon: Tonny Branch, MD;  Location: [facility];  Service: Ophthalmology;  Laterality: Right;  CDE:13.25  . Cataract extraction w/phaco  05/15/2012    Procedure: CATARACT EXTRACTION PHACO AND INTRAOCULAR LENS PLACEMENT (IOC);  Surgeon: Tonny Branch, MD;  Location: [facility];  Service: Ophthalmology;  Laterality: Left;  CDE:  12.32    Denies any headaches, dizziness, double vision, fevers, chills, night sweats, nausea, vomiting, diarrhea, constipation, chest pain, heart palpitations, blood in stool, black tarry stool, urinary pain, urinary burning, urinary frequency, hematuria.   PHYSICAL EXAMINATION  ECOG PERFORMANCE STATUS: 1 - Symptomatic but completely ambulatory  Filed Vitals:   08/05/14 0947  BP: 80/50  Pulse: 107  Temp: 98.7 F (37.1 C)  Resp: 22    GENERAL:alert, no distress, well nourished, well developed, comfortable, cooperative, smiling and accompanied by his wife. SKIN: skin color, texture, turgor are normal, no rashes or significant lesions, fissures continue to improve HEAD: Normocephalic, No masses, lesions, tenderness or abnormalities EYES: normal, PERRLA, EOMI, Conjunctiva are pink and non-injected EARS: External ears normal OROPHARYNX:lips, buccal mucosa, and tongue normal and mucous membranes are moist  NECK: supple, trachea midline LYMPH:  not examined BREAST:not examined LUNGS: clear to auscultation  HEART: regular rate &  rhythm, no murmurs, no gallops, S1 normal and S2 normal ABDOMEN:abdomen soft and normal bowel sounds BACK: Back symmetric, no curvature. EXTREMITIES:less then 2 second capillary refill, no joint deformities, effusion, or inflammation, no skin discoloration  NEURO: alert & oriented x 3 with fluent speech, no focal motor/sensory deficits, gait normal   LABORATORY DATA: CBC    Component Value Date/Time   WBC 17.9* 08/05/2014 0950   RBC 3.22* 08/05/2014 0950   RBC 2.82* 05/21/2014 1422   HGB 11.8* 08/05/2014 0950   HCT 35.9* 08/05/2014 0950   PLT 353 08/05/2014 0950   MCV 111.5* 08/05/2014 0950   MCH 36.6* 08/05/2014 0950   MCHC 32.9 08/05/2014 0950   RDW 15.0 08/05/2014 0950   LYMPHSABS 9.4* 08/05/2014 0950   MONOABS 1.6* 08/05/2014 0950   EOSABS 0.2 08/05/2014 0950   BASOSABS 0.1 08/05/2014 0950      Chemistry      Component Value Date/Time   NA 131* 08/05/2014 0950   K 5.7* 08/05/2014 0950   CL 98 08/05/2014 0950   CO2 24 08/05/2014 0950   BUN 35* 08/05/2014 0950   CREATININE 2.27* 08/05/2014 0950      Component Value Date/Time   CALCIUM 9.1 08/05/2014 0950   ALKPHOS 146* 08/05/2014 0950   AST 37 08/05/2014 0950   ALT 45 08/05/2014 0950   BILITOT 0.7 08/05/2014 0950        ASSESSMENT  AND PLAN:  CLL (chronic lymphocytic leukemia) Pleasant 79 year old male who is S/P a 3 week holding of ibrutinib 420 mg for CLL due to significant fissuring around the fingernails and fissuring of feet bilaterally with a transaminitis.  Liver functions are markedly improved, and normalized as of today (08/05/2014).  Restarted Ibrutinib at dose reduction of 280 mg daily on 07/22/2014.  Labs today as scheduled. Renal function has progressed suddenly and I suspect it is secondary to poor oral intake.  Will perform a close interval recheck with CBC diff, CMET next week.  URI versus LRI noted today.  He has a productive cough of yellow sputum and nasal discharge that is yellow as well.  He  notes SOB without chest pain.  He denies a sudden onset of SOB.  Chest xray today to evaluate for consolidation and to help guide antibiotic treatment.  Tessalon provided today for cough control.  O2 was checked with walking and dropped to 97% compared to 99%.  Other than labs next week, mainly to re-evaluate renal function, will repeat labs in 2 weeks: CBC diff, CMET.  Return in 2 weeks for follow-up.    ADDENDUM: Chest xray is negative for pneumonia.  Augmentin x 7 [facility]    THERAPY PLAN:  Continue Ibrutinib 280 mg daily.  URI/LRI is noted today and therefore, focused on that issue.  All questions were answered. The patient knows to call the clinic with any problems, questions or concerns. We can certainly see the patient much sooner if necessary.  Patient and plan discussed with Dr. Ancil Linsey and she is in agreement with the aforementioned.   This note is electronically signed by: Robynn Pane 08/05/2014 2:03 PM

## 2014-08-05 NOTE — Progress Notes (Signed)
LABS DRAWN

## 2014-08-05 NOTE — Patient Instructions (Signed)
[place] at [facility] Discharge Instructions  RECOMMENDATIONS MADE BY THE CONSULTANT AND ANY TEST RESULTS WILL BE SENT TO YOUR REFERRING PHYSICIAN.  Exam and discussion by Robynn Pane, PA-C Labs are good but you need to increase your fluid intake Continue the Imbruvica Tessalon - take as directed for your cough. Will do chest xray today and once we see the results an antibiotic will be called into your pharmacy. Call with any concerns.  Labs in 1 week and labs and office visit in 2 weeks. Thank you for choosing [place] at [facility] to provide your oncology and hematology care.  To afford each patient quality time with our provider, please arrive at least 15 minutes before your scheduled appointment time.    You need to re-schedule your appointment should you arrive 10 or more minutes late.  We strive to give you quality time with our providers, and arriving late affects you and other patients whose appointments are after yours.  Also, if you no show three or more times for appointments you may be dismissed from the clinic at the providers discretion.     Again, thank you for choosing [facility].  Our hope is that these requests will decrease the amount of time that you wait before being seen by our physicians.       _____________________________________________________________  Should you have questions after your visit to [facility], please contact our office at [phone] between the hours of 8:30 a.m. and 4:30 p.m.  Voicemails left after 4:30 p.m. will not be returned until the following business day.  For prescription refill requests, have your pharmacy contact our office.

## 2014-08-06 ENCOUNTER — Encounter: Payer: Self-pay | Admitting: Dietician

## 2014-08-06 NOTE — Progress Notes (Signed)
Patient identified to be at risk for malnutrition on the MST secondary to Poor oral intake and weight loss  Contacted Pt by Phone  Wt Readings from Last 10 Encounters:  08/05/14 125 lb 6.4 oz (56.881 kg)  07/22/14 129 lb 11.2 oz (58.832 kg)  07/13/14 131 lb 12.8 oz (59.784 kg)  07/05/14 129 lb 8 oz (58.741 kg)  05/28/14 132 lb 14.4 oz (60.283 kg)  04/22/14 130 lb 12.8 oz (59.33 kg)  04/13/14 135 lb 9.3 oz (61.5 kg)  03/25/14 132 lb 9.6 oz (60.147 kg)  03/02/14 132 lb 8 oz (60.102 kg)  02/22/14 124 lb 6.4 oz (56.427 kg)  Patient weight has decreased by about 5 pounds in the last 3 months  Tried to reach pt by phone 3x. On last attempt left message dictating why I was calling and how I could be of service.  Left phone number   Burtis Junes RD, LDN Nutrition Pager: [phone] 08/06/2014 3:46 PM

## 2014-08-09 ENCOUNTER — Encounter (HOSPITAL_COMMUNITY): Payer: Self-pay | Admitting: Emergency Medicine

## 2014-08-09 ENCOUNTER — Emergency Department (HOSPITAL_COMMUNITY): Payer: Medicare Other

## 2014-08-09 ENCOUNTER — Inpatient Hospital Stay (HOSPITAL_COMMUNITY)
Admission: EM | Admit: 2014-08-09 | Discharge: 2014-08-12 | DRG: 194 | Disposition: A | Discharge status: Home / Self Care | Payer: Medicare Other | Attending: Family Medicine | Admitting: Family Medicine

## 2014-08-09 DIAGNOSIS — E440 Moderate protein-calorie malnutrition: Secondary | ICD-10-CM | POA: Diagnosis present

## 2014-08-09 DIAGNOSIS — Z794 Long term (current) use of insulin: Secondary | ICD-10-CM

## 2014-08-09 DIAGNOSIS — C9110 Chronic lymphocytic leukemia of B-cell type not having achieved remission: Secondary | ICD-10-CM | POA: Diagnosis present

## 2014-08-09 DIAGNOSIS — G8929 Other chronic pain: Secondary | ICD-10-CM | POA: Diagnosis present

## 2014-08-09 DIAGNOSIS — J189 Pneumonia, unspecified organism: Secondary | ICD-10-CM | POA: Diagnosis present

## 2014-08-09 DIAGNOSIS — Z886 Allergy status to analgesic agent status: Secondary | ICD-10-CM

## 2014-08-09 DIAGNOSIS — N179 Acute kidney failure, unspecified: Secondary | ICD-10-CM | POA: Diagnosis present

## 2014-08-09 DIAGNOSIS — J180 Bronchopneumonia, unspecified organism: Secondary | ICD-10-CM | POA: Diagnosis not present

## 2014-08-09 DIAGNOSIS — Z9841 Cataract extraction status, right eye: Secondary | ICD-10-CM

## 2014-08-09 DIAGNOSIS — E119 Type 2 diabetes mellitus without complications: Secondary | ICD-10-CM | POA: Diagnosis present

## 2014-08-09 DIAGNOSIS — Z888 Allergy status to other drugs, medicaments and biological substances status: Secondary | ICD-10-CM

## 2014-08-09 DIAGNOSIS — M25579 Pain in unspecified ankle and joints of unspecified foot: Secondary | ICD-10-CM

## 2014-08-09 DIAGNOSIS — E875 Hyperkalemia: Secondary | ICD-10-CM | POA: Diagnosis present

## 2014-08-09 DIAGNOSIS — R531 Weakness: Secondary | ICD-10-CM | POA: Diagnosis not present

## 2014-08-09 DIAGNOSIS — Z85820 Personal history of malignant melanoma of skin: Secondary | ICD-10-CM

## 2014-08-09 DIAGNOSIS — Z961 Presence of intraocular lens: Secondary | ICD-10-CM | POA: Diagnosis present

## 2014-08-09 DIAGNOSIS — M25572 Pain in left ankle and joints of left foot: Secondary | ICD-10-CM | POA: Diagnosis present

## 2014-08-09 DIAGNOSIS — I10 Essential (primary) hypertension: Secondary | ICD-10-CM | POA: Diagnosis present

## 2014-08-09 DIAGNOSIS — Z87891 Personal history of nicotine dependence: Secondary | ICD-10-CM

## 2014-08-09 LAB — CBC WITH DIFFERENTIAL/PLATELET
Basophils Absolute: 0 10*3/uL (ref 0.0–0.1)
Basophils Relative: 0 % (ref 0–1)
EOS PCT: 4 % (ref 0–5)
Eosinophils Absolute: 0.7 10*3/uL (ref 0.0–0.7)
HCT: 31.6 % — ABNORMAL LOW (ref 39.0–52.0)
HEMOGLOBIN: 10.2 g/dL — AB (ref 13.0–17.0)
LYMPHS ABS: 5.4 10*3/uL — AB (ref 0.7–4.0)
LYMPHS PCT: 34 % (ref 12–46)
MCH: 35.8 pg — ABNORMAL HIGH (ref 26.0–34.0)
MCHC: 32.3 g/dL (ref 30.0–36.0)
MCV: 110.9 fL — AB (ref 78.0–100.0)
MONOS PCT: 11 % (ref 3–12)
Monocytes Absolute: 1.8 10*3/uL — ABNORMAL HIGH (ref 0.1–1.0)
Neutro Abs: 8.2 10*3/uL — ABNORMAL HIGH (ref 1.7–7.7)
Neutrophils Relative %: 51 % (ref 43–77)
PLATELETS: 255 10*3/uL (ref 150–400)
RBC: 2.85 MIL/uL — AB (ref 4.22–5.81)
RDW: 14.9 % (ref 11.5–15.5)
WBC: 16.1 10*3/uL — AB (ref 4.0–10.5)

## 2014-08-09 LAB — COMPREHENSIVE METABOLIC PANEL
ALT: 29 U/L (ref 0–53)
ANION GAP: 6 (ref 5–15)
AST: 33 U/L (ref 0–37)
Albumin: 2.5 g/dL — ABNORMAL LOW (ref 3.5–5.2)
Alkaline Phosphatase: 115 U/L (ref 39–117)
BUN: 34 mg/dL — AB (ref 6–23)
CALCIUM: 8.4 mg/dL (ref 8.4–10.5)
CO2: 24 mmol/L (ref 19–32)
CREATININE: 2.44 mg/dL — AB (ref 0.50–1.35)
Chloride: 99 mmol/L (ref 96–112)
GFR calc non Af Amer: 24 mL/min — ABNORMAL LOW (ref >90)
GFR, EST AFRICAN AMERICAN: 28 mL/min — AB (ref >90)
GLUCOSE: 104 mg/dL — AB (ref 70–99)
Potassium: 6 mmol/L — ABNORMAL HIGH (ref 3.5–5.1)
Sodium: 129 mmol/L — ABNORMAL LOW (ref 135–145)
TOTAL PROTEIN: 7.5 g/dL (ref 6.0–8.3)
Total Bilirubin: 0.6 mg/dL (ref 0.3–1.2)

## 2014-08-09 LAB — URINALYSIS, ROUTINE W REFLEX MICROSCOPIC
Bilirubin Urine: NEGATIVE
GLUCOSE, UA: NEGATIVE mg/dL
KETONES UR: NEGATIVE mg/dL
LEUKOCYTES UA: NEGATIVE
NITRITE: NEGATIVE
PROTEIN: 30 mg/dL — AB
Specific Gravity, Urine: 1.015 (ref 1.005–1.030)
UROBILINOGEN UA: 0.2 mg/dL (ref 0.0–1.0)
pH: 7.5 (ref 5.0–8.0)

## 2014-08-09 LAB — CBG MONITORING, ED: GLUCOSE-CAPILLARY: 94 mg/dL (ref 70–99)

## 2014-08-09 LAB — URINE MICROSCOPIC-ADD ON

## 2014-08-09 LAB — TROPONIN I: Troponin I: <0.03 ng/mL (ref <0.031)

## 2014-08-09 MED ORDER — PIPERACILLIN-TAZOBACTAM 3.375 G IVPB
3.3750 g | Freq: Once | INTRAVENOUS | Status: AC
  Administered 2014-08-10: 3.375 g via INTRAVENOUS
  Filled 2014-08-09: qty 50

## 2014-08-09 MED ORDER — VANCOMYCIN HCL IN DEXTROSE 1-5 GM/200ML-% IV SOLN
1000.0000 mg | Freq: Once | INTRAVENOUS | Status: AC
  Administered 2014-08-10: 1000 mg via INTRAVENOUS
  Filled 2014-08-09: qty 200

## 2014-08-09 MED ORDER — CALCIUM GLUCONATE 10 % IV SOLN
1.0000 g | Freq: Once | INTRAVENOUS | Status: AC
  Administered 2014-08-10: 1 g via INTRAVENOUS
  Filled 2014-08-09: qty 10

## 2014-08-09 MED ORDER — DEXTROSE 50 % IV SOLN
1.0000 | Freq: Once | INTRAVENOUS | Status: AC
  Administered 2014-08-10: 50 mL via INTRAVENOUS

## 2014-08-09 MED ORDER — SODIUM CHLORIDE 0.9 % IV BOLUS (SEPSIS)
2000.0000 mL | Freq: Once | INTRAVENOUS | Status: AC
  Administered 2014-08-09: 2000 mL via INTRAVENOUS

## 2014-08-09 MED ORDER — INSULIN ASPART 100 UNIT/ML IV SOLN
10.0000 [IU] | Freq: Once | INTRAVENOUS | Status: AC
  Administered 2014-08-10: 10 [IU] via INTRAVENOUS

## 2014-08-09 MED ORDER — THIAMINE HCL 100 MG/ML IJ SOLN
100.0000 mg | Freq: Once | INTRAMUSCULAR | Status: AC
  Administered 2014-08-09: 100 mg via INTRAVENOUS
  Filled 2014-08-09: qty 2

## 2014-08-09 NOTE — ED Provider Notes (Signed)
CSN: [id number]     Arrival date & time 08/09/14  2054 History   First MD Initiated Contact with Patient 08/09/14 2126    This chart was scribed for Orlie Dakin, MD by Terressa Koyanagi, ED Scribe. This patient was seen in room [facility]14/[facility]14 and the patient's care was started at 9:31 PM.  Chief Complaint  Patient presents with  . Fatigue   The history is provided by the patient and the spouse. No language interpreter was used.   PCP: [name], MD HPI Comments: Randall Reyes is a 79 y.o. male, with PMH noted below including DM, leukemiand anemia, who presents to the Emergency Department complaining of weakness with associated fatigue and decreased appetite onset one week ago. Pt denies f/u with his PCP for his present Sx; however, pt notes he saw his PCP one week ago and his PCP started him on antibiotics for an unidentified infection.   Second Complaint, Left Ankle Pain with Swelling:  Pt also complains of chronic left ankle pain for 20 years since old injury  Third Complaint, Abd Pain: Pt complains of vague abdominal discomfort for the past several days. No pain now. He was treated with a "gas pill" with relief. Pt also complains of associated black stool, but denies constipation, noting he had a bowel movement today. No urinary symptoms.  Fourth Complaint, Cough: Pt also complains of persistent, nonproductive cough onset 2 weeks ago.  Denials: Pt denies EtOH use, tobacco use, or any other Sx at this time.     Past Medical History  Diagnosis Date  . Diabetes mellitus   . Leukemia 6.22.2012    PER PATIENT  . Hypertension   . Ulcer   . Anemia   . CLL (chronic lymphocytic leukemia) 01/24/2011  . Melanoma in situ 01/24/2011  . DM (diabetes mellitus) 01/24/2011  . Mild obesity 01/24/2011  . Eczema 01/24/2011  . Port catheter in place 05/02/2012  . Shingles   . History of pneumonia 04/2014   Past Surgical History  Procedure Laterality Date  . Hernia repair  2001  .  Bleeding ulcer    . Portacath placement  2008  . Cataract extraction w/phaco  05/05/2012    Procedure: CATARACT EXTRACTION PHACO AND INTRAOCULAR LENS PLACEMENT (IOC);  Surgeon: Tonny Branch, MD;  Location: [facility];  Service: Ophthalmology;  Laterality: Right;  CDE:13.25  . Cataract extraction w/phaco  05/15/2012    Procedure: CATARACT EXTRACTION PHACO AND INTRAOCULAR LENS PLACEMENT (IOC);  Surgeon: Tonny Branch, MD;  Location: [facility];  Service: Ophthalmology;  Laterality: Left;  CDE:  12.32   Family History  Problem Relation Age of Onset  . Diabetes Father    History  Substance Use Topics  . Smoking status: Former Research scientist (life sciences)  . Smokeless tobacco: Former Systems developer    Quit date: 04/25/1967  . Alcohol Use: No    Review of Systems  Constitutional: Positive for appetite change.  Gastrointestinal: Positive for abdominal pain.       Black stool  Allergic/Immunologic: Positive for immunocompromised state.       Leukemia  Neurological: Positive for weakness.       Generalized weakness  All other systems reviewed and are negative.     Allergies  Aspirin and Ibuprofen  Home Medications   Prior to Admission medications   Medication Sig Start Date End Date Taking? Authorizing Provider  acetaminophen (TYLENOL) 325 MG tablet Take 650 mg by mouth every 6 (six) hours as needed. Pain.    Historical Provider, MD  acyclovir (ZOVIRAX) 400 MG tablet Take 1 tablet (400 mg total) by mouth 2 (two) times daily. 03/29/14   Baird Cancer, PA-C  amoxicillin-clavulanate (AUGMENTIN) 875-125 MG per tablet Take 1 tablet by mouth 2 (two) times daily. 08/05/14   Baird Cancer, PA-C  bacitracin ointment Apply 1 application topically 2 (two) times daily. Fingers and feet    Historical Provider, MD  benzonatate (TESSALON) 200 MG capsule Take 1 capsule (200 mg total) by mouth 3 (three) times daily as needed for cough. 08/05/14   Baird Cancer, PA-C  Ca Carbonate-Mag Hydroxide (ROLAIDS PO) Take 1 tablet by mouth as  needed.    Historical Provider, MD  Docusate Calcium (STOOL SOFTENER PO) Take 1 capsule by mouth as needed (constipation).     Historical Provider, MD  HYDROcodone-acetaminophen (NORCO) 5-325 MG per tablet Take 1 tablet by mouth every 6 (six) hours as needed for moderate pain. Patient not taking: Reported on 05/28/2014 01/04/14   Manon Hilding Kefalas, PA-C  ibrutinib (IMBRUVICA) 140 MG capsul Take 2 capsules (280 mg total) by mouth daily. 08/05/14   Baird Cancer, PA-C  lisinopril (PRINIVIL,ZESTRIL) 5 MG tablet Take 2.5 mg by mouth daily.  04/16/11   Historical Provider, MD  metFORMIN (GLUCOPHAGE) 500 MG tablet Take 500-1,000 mg by mouth 2 (two) times daily with a meal. 2 tablets in the morning and 1 tablet in the evening.    Historical Provider, MD  Phenyleph-Doxylamine-DM-APAP (ALKA SELTZER PLUS PO) Take 2 tablets by mouth as needed.    Historical Provider, MD  rosuvastatin (CRESTOR) 20 MG tablet Take 20 mg by mouth daily.     Historical Provider, MD  simethicone (MYLICON) 017 MG chewable tablet Chew 125 mg by mouth every 6 (six) hours as needed for flatulence.    Historical Provider, MD  triamterene-hydrochlorothiazide (DYAZIDE) 37.5-25 MG per capsule Take 1 capsule by mouth daily.    Historical Provider, MD  zolpidem (AMBIEN) 5 MG tablet Take 5 mg by mouth at bedtime as needed for sleep.    Historical Provider, MD   Triage Vitals: BP 98/61 mmHg  Pulse 102  Temp(Src) 98.1 F (36.7 C) (Oral)  Resp 16  Ht 5\' 2"  (1.575 m)  Wt 125 lb (56.7 kg)  BMI 22.86 kg/m2  SpO2 98% Physical Exam  Constitutional: He is oriented to person, place, and time. He appears well-developed and well-nourished.  HENT:  Head: Normocephalic and atraumatic.  Poor dentition  Eyes: Conjunctivae are normal. Pupils are equal, round, and reactive to light.  Neck: Neck supple. No tracheal deviation present. No thyromegaly present.  Cardiovascular: Regular rhythm.   No murmur heard. Mildly tachycardic  Pulmonary/Chest:  Effort normal and breath sounds normal.  Occasional cough  Abdominal: Soft. Bowel sounds are normal. He exhibits no distension. There is no tenderness.  Genitourinary: Rectum normal and penis normal. Guaiac negative stool.  Rectum normal tone brown stool Hemoccult negative  Musculoskeletal: Normal range of motion. He exhibits no edema or tenderness.  Neurological: He is alert and oriented to person, place, and time. Coordination normal.  Skin: Skin is warm and dry. No rash noted.  Psychiatric: He has a normal mood and affect.  Nursing note and vitals reviewed.   ED Course  Procedures (including critical care time) DIAGNOSTIC STUDIES: Oxygen Saturation is 98% on RA, nl by my interpretation.    COORDINATION OF CARE: 9:38 PM-Discussed treatment plan which includes rectal exam, meds, imaging, labs, EKG with pt at bedside and pt agreed to plan.  Labs Review Labs Reviewed  URINE CULTURE  COMPREHENSIVE METABOLIC PANEL  TROPONIN I  CBC WITH DIFFERENTIAL/PLATELET  URINALYSIS, ROUTINE W REFLEX MICROSCOPIC  CBG MONITORING, ED  I-STAT CG4 LACTIC ACID, ED    Imaging Review No results found.   EKG Interpretation   Date/Time:  Monday August 09 2014 21:42:20 EDT Ventricular Rate:  99 PR Interval:  223 QRS Duration: 111 QT Interval:  330 QTC Calculation: 423 R Axis:   -71 Text Interpretation:  Sinus rhythm Prolonged PR interval Incomplete RBBB  and LAFB Borderline low voltage, extremity leads Probable right  ventricular hypertrophy No significant change since last tracing Confirmed  by Winfred Leeds  MD, Cara Aguino [phone]) on 08/09/2014 9:49:31 PM      chest x-ray viewed by me Results for orders placed or performed during the hospital encounter of 08/09/14  Comprehensive metabolic panel  Result Value Ref Range   Sodium 129 (L) 135 - 145 mmol/L   Potassium 6.0 (H) 3.5 - 5.1 mmol/L   Chloride 99 96 - 112 mmol/L   CO2 24 19 - 32 mmol/L   Glucose, Bld 104 (H) 70 - 99 mg/dL   BUN 34 (H) 6 -  23 mg/dL   Creatinine, Ser 2.44 (H) 0.50 - 1.35 mg/dL   Calcium 8.4 8.4 - 10.5 mg/dL   Total Protein 7.5 6.0 - 8.3 g/dL   Albumin 2.5 (L) 3.5 - 5.2 g/dL   AST 33 0 - 37 U/L   ALT 29 0 - 53 U/L   Alkaline Phosphatase 115 39 - 117 U/L   Total Bilirubin 0.6 0.3 - 1.2 mg/dL   GFR calc non Af Amer 24 (L) >90 mL/min   GFR calc Af Amer 28 (L) >90 mL/min   Anion gap 6 5 - 15  Troponin I  Result Value Ref Range   Troponin I <0.03 <0.031 ng/mL  CBC with Differential  Result Value Ref Range   WBC 16.1 (H) 4.0 - 10.5 K/uL   RBC 2.85 (L) 4.22 - 5.81 MIL/uL   Hemoglobin 10.2 (L) 13.0 - 17.0 g/dL   HCT 31.6 (L) 39.0 - 52.0 %   MCV 110.9 (H) 78.0 - 100.0 fL   MCH 35.8 (H) 26.0 - 34.0 pg   MCHC 32.3 30.0 - 36.0 g/dL   RDW 14.9 11.5 - 15.5 %   Platelets 255 150 - 400 K/uL   Neutrophils Relative % 51 43 - 77 %   Neutro Abs 8.2 (H) 1.7 - 7.7 K/uL   Lymphocytes Relative 34 12 - 46 %   Lymphs Abs 5.4 (H) 0.7 - 4.0 K/uL   Monocytes Relative 11 3 - 12 %   Monocytes Absolute 1.8 (H) 0.1 - 1.0 K/uL   Eosinophils Relative 4 0 - 5 %   Eosinophils Absolute 0.7 0.0 - 0.7 K/uL   Basophils Relative 0 0 - 1 %   Basophils Absolute 0.0 0.0 - 0.1 K/uL   WBC Morphology WHITE COUNT CONFIRMED ON SMEAR    RBC Morphology STOMATOCYTES    Smear Review LARGE PLATELETS PRESENT   Urinalysis, Routine w reflex microscopic  Result Value Ref Range   Color, Urine YELLOW YELLOW   APPearance CLEAR CLEAR   Specific Gravity, Urine 1.015 1.005 - 1.030   pH 7.5 5.0 - 8.0   Glucose, UA NEGATIVE NEGATIVE mg/dL   Hgb urine dipstick TRACE (A) NEGATIVE   Bilirubin Urine NEGATIVE NEGATIVE   Ketones, ur NEGATIVE NEGATIVE mg/dL   Protein, ur 30 (A) NEGATIVE mg/dL  Urobilinogen, UA 0.2 0.0 - 1.0 mg/dL   Nitrite NEGATIVE NEGATIVE   Leukocytes, UA NEGATIVE NEGATIVE  Urine microscopic-add on  Result Value Ref Range   RBC / HPF 0-2 <3 RBC/hpf  POC CBG, ED  Result Value Ref Range   Glucose-Capillary 94 70 - 99 mg/dL   Dg  Chest 1 View  08/05/2014   CLINICAL DATA:  Cough, history of chronic lymphocytic leukemia, shortness of breath  EXAM: CHEST  1 VIEW  COMPARISON:  Chest x-ray of 04/14/2014  FINDINGS: No focal infiltrate or effusion is seen. Minimal left basilar linear atelectasis is noted. Mediastinal and hilar contours are unremarkable. A right-sided Port-A-Cath is present with the tip overlying the mid lower SVC. The heart is unchanged in size. No bony abnormality is seen.  IMPRESSION: Mild basilar linear atelectasis. Right-sided Port-A-Cath tip overlies the mid lower SVC.   Electronically Signed   By: [name] M.D.   On: 08/05/2014 13:10   Dg Chest 2 View  08/09/2014   CLINICAL DATA:  Acute onset of generalized weakness and decreased appetite. Initial encounter.  EXAM: CHEST  2 VIEW  COMPARISON:  Chest radiograph performed 08/05/2014  FINDINGS: The lungs are well-aerated. Mild bibasilar opacities may reflect atelectasis or possibly mild pneumonia. There is no evidence of pleural effusion or pneumothorax. A rounded density at the left lung base is thought reflect the overlying nipple shadow.  The heart is normal in size; the mediastinal contour is within normal limits. No acute osseous abnormalities are seen. There is chronic loss of height at the upper lumbar spine. A right-sided chest port is noted ending about the mid to distal SVC.  IMPRESSION: Mild bibasilar airspace opacities may reflect atelectasis or possibly mild pneumonia.   Electronically Signed   By: [name] M.D.   On: 08/09/2014 22:31    MDM  Patient noted to have worsening renal insufficiency, hyperkalemia and pneumonia correlates clinically with cough. We'll treat forHCAP, IV antibiotics, D50, intravenous insulin and calcium for hyperkalemia . Dr. Ernestina Patches consult for admission  Final diagnoses:  None  Dx#1HCAP #2hypekalemia #3hyponatremia #4renal failure #5 anemia CRITICAL CARE Performed by: Orlie Dakin Total critical care time: 30  minute Critical care time was exclusive of separately billable procedures and treating other patients. Critical care was necessary to treat or prevent imminent or life-threatening deterioration. Critical care was time spent personally by me on the following activities: development of treatment plan with patient and/or surrogate as well as nursing, discussions with consultants, evaluation of patient's response to treatment, examination of patient, obtaining history from patient or surrogate, ordering and performing treatments and interventions, ordering and review of laboratory studies, ordering and review of radiographic studies, pulse oximetry and re-evaluation of patient's condition.    I personally performed the services described in this documentation, which was scribed in my presence. The recorded information has been reviewed and is accurate.     Orlie Dakin, MD 08/10/14 [phone]

## 2014-08-09 NOTE — ED Notes (Signed)
Patient very weak while standing doing orthostatic

## 2014-08-09 NOTE — ED Notes (Signed)
Patient's wife reports patient has had fatigue and generalized weakness for approximately a weak. Patient reports decreased appetite.

## 2014-08-10 ENCOUNTER — Inpatient Hospital Stay (HOSPITAL_COMMUNITY): Payer: Medicare Other

## 2014-08-10 DIAGNOSIS — Z87891 Personal history of nicotine dependence: Secondary | ICD-10-CM | POA: Diagnosis not present

## 2014-08-10 DIAGNOSIS — M25579 Pain in unspecified ankle and joints of unspecified foot: Secondary | ICD-10-CM | POA: Diagnosis not present

## 2014-08-10 DIAGNOSIS — E119 Type 2 diabetes mellitus without complications: Secondary | ICD-10-CM | POA: Diagnosis present

## 2014-08-10 DIAGNOSIS — I10 Essential (primary) hypertension: Secondary | ICD-10-CM | POA: Diagnosis present

## 2014-08-10 DIAGNOSIS — E875 Hyperkalemia: Secondary | ICD-10-CM | POA: Diagnosis present

## 2014-08-10 DIAGNOSIS — G8929 Other chronic pain: Secondary | ICD-10-CM | POA: Diagnosis present

## 2014-08-10 DIAGNOSIS — E440 Moderate protein-calorie malnutrition: Secondary | ICD-10-CM | POA: Diagnosis present

## 2014-08-10 DIAGNOSIS — M25572 Pain in left ankle and joints of left foot: Secondary | ICD-10-CM | POA: Diagnosis present

## 2014-08-10 DIAGNOSIS — Z9841 Cataract extraction status, right eye: Secondary | ICD-10-CM | POA: Diagnosis not present

## 2014-08-10 DIAGNOSIS — N179 Acute kidney failure, unspecified: Secondary | ICD-10-CM | POA: Diagnosis present

## 2014-08-10 DIAGNOSIS — Z961 Presence of intraocular lens: Secondary | ICD-10-CM | POA: Diagnosis present

## 2014-08-10 DIAGNOSIS — Z886 Allergy status to analgesic agent status: Secondary | ICD-10-CM | POA: Diagnosis not present

## 2014-08-10 DIAGNOSIS — C9110 Chronic lymphocytic leukemia of B-cell type not having achieved remission: Secondary | ICD-10-CM | POA: Diagnosis present

## 2014-08-10 DIAGNOSIS — R531 Weakness: Secondary | ICD-10-CM | POA: Diagnosis present

## 2014-08-10 DIAGNOSIS — Z888 Allergy status to other drugs, medicaments and biological substances status: Secondary | ICD-10-CM | POA: Diagnosis not present

## 2014-08-10 DIAGNOSIS — J180 Bronchopneumonia, unspecified organism: Secondary | ICD-10-CM | POA: Diagnosis present

## 2014-08-10 DIAGNOSIS — Z794 Long term (current) use of insulin: Secondary | ICD-10-CM | POA: Diagnosis not present

## 2014-08-10 DIAGNOSIS — J189 Pneumonia, unspecified organism: Secondary | ICD-10-CM | POA: Diagnosis present

## 2014-08-10 DIAGNOSIS — Z85820 Personal history of malignant melanoma of skin: Secondary | ICD-10-CM | POA: Diagnosis not present

## 2014-08-10 LAB — CBC WITH DIFFERENTIAL/PLATELET
BASOS ABS: 0 10*3/uL (ref 0.0–0.1)
Basophils Relative: 0 % (ref 0–1)
EOS ABS: 0.5 10*3/uL (ref 0.0–0.7)
Eosinophils Relative: 4 % (ref 0–5)
HCT: 29.4 % — ABNORMAL LOW (ref 39.0–52.0)
HEMOGLOBIN: 9.4 g/dL — AB (ref 13.0–17.0)
Lymphocytes Relative: 27 % (ref 12–46)
Lymphs Abs: 3.7 10*3/uL (ref 0.7–4.0)
MCH: 35.7 pg — ABNORMAL HIGH (ref 26.0–34.0)
MCHC: 32 g/dL (ref 30.0–36.0)
MCV: 111.8 fL — ABNORMAL HIGH (ref 78.0–100.0)
MONOS PCT: 12 % (ref 3–12)
Monocytes Absolute: 1.6 10*3/uL — ABNORMAL HIGH (ref 0.1–1.0)
Neutro Abs: 7.9 10*3/uL — ABNORMAL HIGH (ref 1.7–7.7)
Neutrophils Relative %: 57 % (ref 43–77)
Platelets: 218 10*3/uL (ref 150–400)
RBC: 2.63 MIL/uL — AB (ref 4.22–5.81)
RDW: 15 % (ref 11.5–15.5)
WBC: 13.8 10*3/uL — ABNORMAL HIGH (ref 4.0–10.5)

## 2014-08-10 LAB — COMPREHENSIVE METABOLIC PANEL
ALT: 25 U/L (ref 0–53)
ANION GAP: 5 (ref 5–15)
AST: 25 U/L (ref 0–37)
Albumin: 2.2 g/dL — ABNORMAL LOW (ref 3.5–5.2)
Alkaline Phosphatase: 103 U/L (ref 39–117)
BUN: 26 mg/dL — ABNORMAL HIGH (ref 6–23)
CALCIUM: 8 mg/dL — AB (ref 8.4–10.5)
CO2: 22 mmol/L (ref 19–32)
Chloride: 105 mmol/L (ref 96–112)
Creatinine, Ser: 2.02 mg/dL — ABNORMAL HIGH (ref 0.50–1.35)
GFR, EST AFRICAN AMERICAN: 35 mL/min — AB (ref >90)
GFR, EST NON AFRICAN AMERICAN: 30 mL/min — AB (ref >90)
Glucose, Bld: 152 mg/dL — ABNORMAL HIGH (ref 70–99)
Potassium: 4.7 mmol/L (ref 3.5–5.1)
Sodium: 132 mmol/L — ABNORMAL LOW (ref 135–145)
TOTAL PROTEIN: 6.6 g/dL (ref 6.0–8.3)
Total Bilirubin: 0.5 mg/dL (ref 0.3–1.2)

## 2014-08-10 LAB — TSH: TSH: 1.606 u[IU]/mL (ref 0.350–4.500)

## 2014-08-10 LAB — GLUCOSE, CAPILLARY
GLUCOSE-CAPILLARY: 137 mg/dL — AB (ref 70–99)
Glucose-Capillary: 196 mg/dL — ABNORMAL HIGH (ref 70–99)
Glucose-Capillary: 114 mg/dL — ABNORMAL HIGH (ref 70–99)
Glucose-Capillary: 101 mg/dL — ABNORMAL HIGH (ref 70–99)
Glucose-Capillary: 195 mg/dL — ABNORMAL HIGH (ref 70–99)
Glucose-Capillary: 122 mg/dL — ABNORMAL HIGH (ref 70–99)

## 2014-08-10 LAB — STREP PNEUMONIAE URINARY ANTIGEN: Strep Pneumo Urinary Antigen: NEGATIVE

## 2014-08-10 LAB — CBG MONITORING, ED
Glucose-Capillary: 97 mg/dL (ref 70–99)
Glucose-Capillary: 43 mg/dL — CL (ref 70–99)

## 2014-08-10 LAB — LACTIC ACID, PLASMA
LACTIC ACID, VENOUS: 0.6 mmol/L (ref 0.5–2.0)
Lactic Acid, Venous: 1.4 mmol/L (ref 0.5–2.0)

## 2014-08-10 MED ORDER — BENZONATATE 100 MG PO CAPS: 200.0000 mg | ORAL_CAPSULE | Freq: Three times a day (TID) | ORAL | Status: DC | PRN

## 2014-08-10 MED ORDER — GUAIFENESIN ER 600 MG PO TB12
1200.0000 mg | ORAL_TABLET | Freq: Two times a day (BID) | ORAL | Status: DC
  Administered 2014-08-10 – 2014-08-12 (×5): 1200 mg via ORAL
  Filled 2014-08-10 (×5): qty 2

## 2014-08-10 MED ORDER — ENSURE ENLIVE PO LIQD
237.0000 mL | Freq: Two times a day (BID) | ORAL | Status: DC
  Administered 2014-08-10: 237 mL via ORAL

## 2014-08-10 MED ORDER — SODIUM CHLORIDE 0.9 % IV SOLN
INTRAVENOUS | Status: AC
  Filled 2014-08-10: qty 10

## 2014-08-10 MED ORDER — ROSUVASTATIN CALCIUM 20 MG PO TABS
20.0000 mg | ORAL_TABLET | Freq: Every day | ORAL | Status: DC
  Administered 2014-08-10 – 2014-08-12 (×3): 20 mg via ORAL
  Filled 2014-08-10 (×5): qty 1

## 2014-08-10 MED ORDER — IBRUTINIB 140 MG PO CAPS
280.0000 mg | ORAL_CAPSULE | Freq: Every day | ORAL | Status: DC
  Administered 2014-08-10 – 2014-08-12 (×3): 280 mg via ORAL
  Filled 2014-08-10 (×2): qty 2

## 2014-08-10 MED ORDER — HEPARIN SODIUM (PORCINE) 5000 UNIT/ML IJ SOLN
5000.0000 [IU] | Freq: Three times a day (TID) | INTRAMUSCULAR | Status: DC
  Administered 2014-08-10 – 2014-08-12 (×7): 5000 [IU] via SUBCUTANEOUS
  Filled 2014-08-10 (×6): qty 1

## 2014-08-10 MED ORDER — DEXTROSE 5 % IV SOLN
INTRAVENOUS | Status: AC
  Filled 2014-08-10: qty 1

## 2014-08-10 MED ORDER — DEXTROSE 5 % IV SOLN
1.0000 g | Freq: Three times a day (TID) | INTRAVENOUS | Status: DC
  Filled 2014-08-10 (×2): qty 1

## 2014-08-10 MED ORDER — ZOLPIDEM TARTRATE 5 MG PO TABS
5.0000 mg | ORAL_TABLET | Freq: Every evening | ORAL | Status: DC | PRN
  Administered 2014-08-11: 5 mg via ORAL
  Filled 2014-08-10: qty 1

## 2014-08-10 MED ORDER — CEFEPIME HCL 1 G IJ SOLR
1.0000 g | INTRAMUSCULAR | Status: DC
Start: 2014-08-11 — End: 2014-08-12
  Administered 2014-08-11 – 2014-08-12 (×2): 1 g via INTRAVENOUS
  Filled 2014-08-10 (×3): qty 1

## 2014-08-10 MED ORDER — ENSURE ENLIVE PO LIQD
237.0000 mL | Freq: Two times a day (BID) | ORAL | Status: DC
  Administered 2014-08-10 – 2014-08-12 (×5): 237 mL via ORAL

## 2014-08-10 MED ORDER — ACETAMINOPHEN 325 MG PO TABS
650.0000 mg | ORAL_TABLET | Freq: Four times a day (QID) | ORAL | Status: DC | PRN
  Administered 2014-08-10 – 2014-08-12 (×5): 650 mg via ORAL
  Filled 2014-08-10 (×5): qty 2

## 2014-08-10 MED ORDER — DEXTROSE 50 % IV SOLN
25.0000 g | Freq: Once | INTRAVENOUS | Status: AC
  Administered 2014-08-10: 50 mL via INTRAVENOUS

## 2014-08-10 MED ORDER — INSULIN ASPART 100 UNIT/ML ~~LOC~~ SOLN
SUBCUTANEOUS | Status: AC
  Filled 2014-08-10: qty 1

## 2014-08-10 MED ORDER — DEXTROSE 50 % IV SOLN
INTRAVENOUS | Status: AC
  Filled 2014-08-10: qty 50

## 2014-08-10 MED ORDER — ALBUTEROL SULFATE (2.5 MG/3ML) 0.083% IN NEBU
2.5000 mg | INHALATION_SOLUTION | RESPIRATORY_TRACT | Status: DC
  Administered 2014-08-10 (×2): 2.5 mg via RESPIRATORY_TRACT
  Filled 2014-08-10 (×3): qty 3

## 2014-08-10 MED ORDER — ALBUTEROL SULFATE (2.5 MG/3ML) 0.083% IN NEBU
2.5000 mg | INHALATION_SOLUTION | Freq: Three times a day (TID) | RESPIRATORY_TRACT | Status: DC
  Administered 2014-08-10 – 2014-08-12 (×5): 2.5 mg via RESPIRATORY_TRACT
  Filled 2014-08-10 (×4): qty 3

## 2014-08-10 MED ORDER — SODIUM CHLORIDE 0.9 % IV SOLN
500.0000 mg | INTRAVENOUS | Status: DC
Start: 2014-08-11 — End: 2014-08-12
  Administered 2014-08-11 – 2014-08-12 (×2): 500 mg via INTRAVENOUS
  Filled 2014-08-10 (×3): qty 500

## 2014-08-10 MED ORDER — ACYCLOVIR 400 MG PO TABS
400.0000 mg | ORAL_TABLET | Freq: Two times a day (BID) | ORAL | Status: DC
  Filled 2014-08-10 (×3): qty 1

## 2014-08-10 MED ORDER — ACYCLOVIR 200 MG PO CAPS
400.0000 mg | ORAL_CAPSULE | Freq: Two times a day (BID) | ORAL | Status: DC
  Administered 2014-08-10 – 2014-08-12 (×6): 400 mg via ORAL
  Filled 2014-08-10 (×6): qty 2

## 2014-08-10 MED ORDER — INSULIN ASPART 100 UNIT/ML ~~LOC~~ SOLN
0.0000 [IU] | SUBCUTANEOUS | Status: DC
  Administered 2014-08-10: 2 [IU] via SUBCUTANEOUS
  Administered 2014-08-10 (×2): 1 [IU] via SUBCUTANEOUS
  Administered 2014-08-11: 2 [IU] via SUBCUTANEOUS
  Administered 2014-08-11 – 2014-08-12 (×4): 1 [IU] via SUBCUTANEOUS

## 2014-08-10 MED ORDER — DEXTROSE 50 % IV SOLN
INTRAVENOUS | Status: AC
  Administered 2014-08-10: 50 mL via INTRAVENOUS
  Filled 2014-08-10: qty 50

## 2014-08-10 MED ORDER — CEFEPIME HCL 1 G IJ SOLR
1.0000 g | Freq: Three times a day (TID) | INTRAMUSCULAR | Status: DC
  Administered 2014-08-10: 1 g via INTRAVENOUS
  Filled 2014-08-10 (×2): qty 1

## 2014-08-10 NOTE — H&P (Addendum)
Hospitalist Admission History and Physical  Patient name: Randall Reyes Medical record number: [id number] Date of birth: [birth year] Age: 78 y.o. Gender: male  Primary Care Provider: [name], MD  Chief Complaint: weakness, HCAP, AK I, hyperkalemia  History of Present Illness:This is a 79 y.o. year old male with significant past medical history of CLL, DM, HTN presenting with weakness, HCAP. Per the family, patient's had progressive weakness and fatigue for multiple weeks. Patient is noted to be currently on chemotherapy treatment for CLL. Patient's regimen was noted to be recently change secondary to side effects including finger blistering. Per the family, patient has been on chemotherapy for the past 2 months (records report 2 wek). Patient has had progressive weakness, fatigue, decreased appetite over this timeframe. Patient is also had progressive cough that he has seen outpatient hematology oncology 4. Was placed on Augmentin empirically for bronchitis/pneumonia coverage per records. Patient still with cough. Patient/family deny any fevers or chills. No abdominal pain. No diarrhea. No dysuria. No nausea or vomiting. Presented to the ER temperature 98.6, heart rate in the 90s to 110s, respirations in the tens, blood pressure in the 90s to 100s, satting 97% on room air. White blood cell count 16.1, hemoglobin 10.2, creatinine 2.44, potassium 6. Urinalysis negative for infection. Troponin negative 1.CXR shows bibasilar PNA.   Assessment and Plan:  Active Problems:   HCAP (healthcare-associated pneumonia)   AKI (acute kidney injury)   Hyperkalemia   1- HCAP  -IV vanc and cefepime -blood cultures -urine strep and legionella -supplemental O2 prn   2-Fatigue  -likely multifactorial in setting of above, AKI, CLL-chemotherapy  -check vit d and TSH  -follow  3-AKI -likely multifactorial in setting of decreased po intake, nephrotoxic agents and CLL tx -dry on exam -likely  prerenal  -hydrate -hold offending meds -renal u/s  -follow   4-Hyperkalemia -EKG sinus rhythm and no peaked t waves -s/p IV calcium and insulin -kayexalate x 1 -tele bed -follow   5-CLL -on chemotherapy -cont  -f/u H-O recs in am   6- Ankle pain  -mild palpable ankle pain on exam  -xray  -follow   FEN/GI: heart healthy-carb modified diet  Prophylaxis: sub q heparin  Disposition: pending further evaluation  Code Status:Full Code    Patient Active Problem List   Diagnosis Date Noted  . HCAP (healthcare-associated pneumonia) 08/10/2014  . Pneumonia 12020-03-2314  . Sepsis due to pneumonia 12020-03-2314  . Leukocytosis 12020-03-2314  . Port catheter in place 05/02/2012  . CLL (chronic lymphocytic leukemia) 01/24/2011  . Melanoma in situ 01/24/2011  . DM (diabetes mellitus) 01/24/2011  . Mild obesity 01/24/2011  . Eczema 01/24/2011   Past Medical History: Past Medical History  Diagnosis Date  . Diabetes mellitus   . Leukemia 6.22.2012    PER PATIENT  . Hypertension   . Ulcer   . Anemia   . CLL (chronic lymphocytic leukemia) 01/24/2011  . Melanoma in situ 01/24/2011  . DM (diabetes mellitus) 01/24/2011  . Mild obesity 01/24/2011  . Eczema 01/24/2011  . Port catheter in place 05/02/2012  . Shingles   . History of pneumonia 04/2014    Past Surgical History: Past Surgical History  Procedure Laterality Date  . Hernia repair  2001  . Bleeding ulcer    . Portacath placement  2008  . Cataract extraction w/phaco  05/05/2012    Procedure: CATARACT EXTRACTION PHACO AND INTRAOCULAR LENS PLACEMENT (IOC);  Surgeon: Tonny Branch, MD;  Location: [facility];  Service: Ophthalmology;  Laterality:  Right;  CDE:13.25  . Cataract extraction w/phaco  05/15/2012    Procedure: CATARACT EXTRACTION PHACO AND INTRAOCULAR LENS PLACEMENT (IOC);  Surgeon: Tonny Branch, MD;  Location: [facility];  Service: Ophthalmology;  Laterality: Left;  CDE:  12.32    Social History: History   Social History  .  Marital Status: Married    Spouse Name: N/A  . Number of Children: N/A  . Years of Education: N/A   Social History Main Topics  . Smoking status: Former Research scientist (life sciences)  . Smokeless tobacco: Former Systems developer    Quit date: 04/25/1967  . Alcohol Use: No  . Drug Use: No  . Sexual Activity: Not on file   Other Topics Concern  . None   Social History Narrative    Family History: Family History  Problem Relation Age of Onset  . Diabetes Father     Allergies: Allergies  Allergen Reactions  . Aspirin Other (See Comments)    Bleeding ulcers.   . Ibuprofen Other (See Comments)    Bleeding ulcers.    Current Facility-Administered Medications  Medication Dose Route Frequency Provider Last Rate Last Dose  . acyclovir (ZOVIRAX) tablet 400 mg  400 mg Oral BID Deneise Lever, MD      . benzonatate (TESSALON) capsule 200 mg  200 mg Oral TID PRN Deneise Lever, MD      . calcium gluconate inj 10% (1 g) URGENT USE ONLY!  1 g Intravenous Once Orlie Dakin, MD      . ceFEPIme (MAXIPIME) 1 g in dextrose 5 % 50 mL IVPB  1 g Intravenous 3 times per day Deneise Lever, MD      . dextrose 50 % solution           . heparin injection 5,000 Units  5,000 Units Subcutaneous 3 times per day Deneise Lever, MD      . ibrutinib (IMBRUVICA) capsule 280 mg  280 mg Oral Daily Deneise Lever, MD      . insulin aspart (novoLOG) 100 UNIT/ML injection           . piperacillin-tazobactam (ZOSYN) IVPB 3.375 g  3.375 g Intravenous Once Orlie Dakin, MD      . rosuvastatin (CRESTOR) tablet 20 mg  20 mg Oral Daily Deneise Lever, MD      . sodium chloride 0.9 % with calcium gluconate ADS Med           . vancomycin (VANCOCIN) IVPB 1000 mg/200 mL premix  1,000 mg Intravenous Once Orlie Dakin, MD      . zolpidem (AMBIEN) tablet 5 mg  5 mg Oral QHS PRN Deneise Lever, MD       Current Outpatient Prescriptions  Medication Sig Dispense Refill  . acetaminophen (TYLENOL) 325 MG tablet Take 650 mg by mouth every 6  (six) hours as needed. Pain.    [name] acyclovir (ZOVIRAX) 400 MG tablet Take 1 tablet (400 mg total) by mouth 2 (two) times daily. 60 tablet 6  . amoxicillin-clavulanate (AUGMENTIN) 875-125 MG per tablet Take 1 tablet by mouth 2 (two) times daily. 14 tablet 0  . bacitracin ointment Apply 1 application topically 2 (two) times daily. Fingers and feet    . benzonatate (TESSALON) 200 MG capsule Take 1 capsule (200 mg total) by mouth 3 (three) times daily as needed for cough. 30 capsule 1  . Ca Carbonate-Mag Hydroxide (ROLAIDS PO) Take 1 tablet by mouth daily as needed (for heartburn).     [name]  Docusate Calcium (STOOL SOFTENER PO) Take 1 capsule by mouth daily as needed (constipation).     . ibrutinib (IMBRUVICA) 140 MG capsul Take 2 capsules (280 mg total) by mouth daily. 60 capsule 0  . metFORMIN (GLUCOPHAGE) 500 MG tablet Take 500-1,000 mg by mouth 2 (two) times daily with a meal. 2 tablets in the morning and 1 tablet in the evening.    [name] Phenyleph-Doxylamine-DM-APAP (ALKA SELTZER PLUS PO) Take 2 tablets by mouth daily as needed (for stomach upset).     . ranitidine (ZANTAC) 150 MG tablet Take 150 mg by mouth 3 (three) times daily with meals.    . rosuvastatin (CRESTOR) 20 MG tablet Take 20 mg by mouth daily.     [name] triamterene-hydrochlorothiazide (DYAZIDE) 37.5-25 MG per capsule Take 1 capsule by mouth daily.    [name] zolpidem (AMBIEN) 5 MG tablet Take 5 mg by mouth at bedtime as needed for sleep.    [name] HYDROcodone-acetaminophen (NORCO) 5-325 MG per tablet Take 1 tablet by mouth every 6 (six) hours as needed for moderate pain. (Patient not taking: Reported on 05/28/2014) 30 tablet 0   Review Of Systems: 12 point ROS negative except as noted above in HPI.  Physical Exam: Filed Vitals:   08/09/14 2346  BP: 105/68  Pulse: 96  Temp: 98.6 F (37 C)  Resp: 18    General: cooperative and cachectic HEENT: PERRLA and extra ocular movement intact Heart: S1, S2 normal, no murmur, rub or gallop, regular rate and  rhythm Lungs: clear to auscultation, no wheezes or rales and unlabored breathing Abdomen: abdomen is soft without significant tenderness, masses, organomegaly or guarding Extremities: extremities normal, atraumatic, no cyanosis or edema, mild L ankle TTP Skin:no rashes, pale Neurology: normal without focal findings  Labs and Imaging: Lab Results  Component Value Date/Time   NA 129* 08/09/2014 09:53 PM   K 6.0* 08/09/2014 09:53 PM   CL 99 08/09/2014 09:53 PM   CO2 24 08/09/2014 09:53 PM   BUN 34* 08/09/2014 09:53 PM   CREATININE 2.44* 08/09/2014 09:53 PM   GLUCOSE 104* 08/09/2014 09:53 PM   Lab Results  Component Value Date   WBC 16.1* 08/09/2014   HGB 10.2* 08/09/2014   HCT 31.6* 08/09/2014   MCV 110.9* 08/09/2014   PLT 255 08/09/2014   Urinalysis    Component Value Date/Time   COLORURINE YELLOW 08/09/2014 2128   APPEARANCEUR CLEAR 08/09/2014 2128   LABSPEC 1.015 08/09/2014 2128   PHURINE 7.5 08/09/2014 2128   GLUCOSEU NEGATIVE 08/09/2014 2128   HGBUR TRACE* 08/09/2014 2128   BILIRUBINUR NEGATIVE 08/09/2014 2128   [place] NEGATIVE 08/09/2014 2128   PROTEINUR 30* 08/09/2014 2128   UROBILINOGEN 0.2 08/09/2014 2128   NITRITE NEGATIVE 08/09/2014 2128   LEUKOCYTESUR NEGATIVE 08/09/2014 2128      Dg Chest 2 View  08/09/2014   CLINICAL DATA:  Acute onset of generalized weakness and decreased appetite. Initial encounter.  EXAM: CHEST  2 VIEW  COMPARISON:  Chest radiograph performed 08/05/2014  FINDINGS: The lungs are well-aerated. Mild bibasilar opacities may reflect atelectasis or possibly mild pneumonia. There is no evidence of pleural effusion or pneumothorax. A rounded density at the left lung base is thought reflect the overlying nipple shadow.  The heart is normal in size; the mediastinal contour is within normal limits. No acute osseous abnormalities are seen. There is chronic loss of height at the upper lumbar spine. A right-sided chest port is noted ending about the  mid to distal SVC.  IMPRESSION:  Mild bibasilar airspace opacities may reflect atelectasis or possibly mild pneumonia.   Electronically Signed   By: [name] M.D.   On: 08/09/2014 22:31           Shanda Howells MD  Pager: [phone]

## 2014-08-10 NOTE — Progress Notes (Signed)
Patient with known CLL admitted with the constitutional symptoms of malaise cough and found to have bibasilar atelectasis/infiltrate on chest x-ray and previous pneumonia several months ago and is admitted with HCAP currently on vancomycin and Maxipime and Zosyn. He likewise had some AK I with hyperkalemia which is now improving with fluid resuscitation Randall Reyes:[id number] DOB: 07-21-1935 DOA: 08/09/2014 PCP: [name], MD             Physical Exam: Blood pressure 117/67, pulse 72, temperature 98.2 F (36.8 C), temperature source Oral, resp. rate 18, height 5' 2" (1.575 m), weight 125 lb 10.6 oz (57 kg), SpO2 72 %. lungs diminished breath sounds in the bases no rales wheeze rhonchi appreciable heart regular rhythm no S3 or S4 no heaves thrills rubs abdomen soft nontender bowel sounds normoactive   Investigations:  Recent Results (from the past 240 hour(s))  Blood culture (routine x 2)     Status: None (Preliminary result)   Collection Time: 08/10/14 12:38 AM  Result Value Ref Range Status   Specimen Description BLOOD RIGHT ANTECUBITAL  Final   Special Requests   Final    BOTTLES DRAWN AEROBIC AND ANAEROBIC AEB 10CC ANA [place]   Culture PENDING  Incomplete   Report Status PENDING  Incomplete  Blood culture (routine x 2)     Status: None (Preliminary result)   Collection Time: 08/10/14 12:43 AM  Result Value Ref Range Status   Specimen Description BLOOD LEFT ANTECUBITAL  Final   Special Requests   Final    BOTTLES DRAWN AEROBIC AND ANAEROBIC AEB 10CC ANA 8CC   Culture PENDING  Incomplete   Report Status PENDING  Incomplete     Basic Metabolic Panel:  Recent Labs  08/09/14 2153 08/10/14 0345  NA 129* 132*  K 6.0* 4.7  CL 99 105  CO2 24 22  GLUCOSE 104* 152*  BUN 34* 26*  CREATININE 2.44* 2.02*  CALCIUM 8.4 8.0*   Liver Function Tests:  Recent Labs  08/09/14 2153 08/10/14 0345  AST 33 25  ALT 29 25  ALKPHOS 115 103  BILITOT 0.6 0.5  PROT  7.5 6.6  ALBUMIN 2.5* 2.2*     CBC:  Recent Labs  08/09/14 2153 08/10/14 0345  WBC 16.1* 13.8*  NEUTROABS 8.2* 7.9*  HGB 10.2* 9.4*  HCT 31.6* 29.4*  MCV 110.9* 111.8*  PLT 255 218    Dg Chest 2 View  08/09/2014   CLINICAL DATA:  Acute onset of generalized weakness and decreased appetite. Initial encounter.  EXAM: CHEST  2 VIEW  COMPARISON:  Chest radiograph performed 08/05/2014  FINDINGS: The lungs are well-aerated. Mild bibasilar opacities may reflect atelectasis or possibly mild pneumonia. There is no evidence of pleural effusion or pneumothorax. A rounded density at the left lung base is thought reflect the overlying nipple shadow.  The heart is normal in size; the mediastinal contour is within normal limits. No acute osseous abnormalities are seen. There is chronic loss of height at the upper lumbar spine. A right-sided chest port is noted ending about the mid to distal SVC.  IMPRESSION: Mild bibasilar airspace opacities may reflect atelectasis or possibly mild pneumonia.   Electronically Signed   By: [name] M.D.   On: 08/09/2014 22:31   Dg Ankle Complete Left  08/10/2014   CLINICAL DATA:  Chronic ankle pain for multiple years. No known injury.  EXAM: LEFT ANKLE COMPLETE - 3+ VIEW  COMPARISON:  None.  FINDINGS: No fracture or dislocation. The  alignment and joint spaces are maintained. Mild degenerative change at the tibiotalar joint. No osseous destructive change. There is a plantar calcaneal spur. Mild soft tissue edema.  IMPRESSION: No acute bony abnormality. Mild degenerative change at the tibiotalar joint.   Electronically Signed   By: [name] M.D.   On: 08/10/2014 02:29      Medications:   Impression:  Active Problems:   HCAP (healthcare-associated pneumonia)   AKI (acute kidney injury)   Hyperkalemia     Plan: Continue vancomycin and Zosyn and Maxipime ulnar consultation requested this morning  Consultants: Pulmonary  requested   Procedures   Antibiotics: Vancomycin and Zosyn and Maxipime                  Code Status: Full   Family Communication:  Spoke with wife and patient at bedside  Disposition Plan CBC and be met in a.m. monitor renal function and leukocytosis which is defervescing  Time spent: 30 minutes   LOS: 0 days   DONDIEGO,RICHARD M   08/10/2014, 7:10 AM

## 2014-08-10 NOTE — Progress Notes (Signed)
[name] for Vancomycin & Cefepime Indication: pneumonia  Allergies  Allergen Reactions  . Aspirin Other (See Comments)    Bleeding ulcers.   . Ibuprofen Other (See Comments)    Bleeding ulcers.    Patient Measurements: Height: 5\' 2"  (157.5 cm) Weight: 125 lb 10.6 oz (57 kg) IBW/kg (Calculated) : 54.6  Vital Signs: Temp: 98.2 F (36.8 C) (03/29 0621) Temp Source: Oral (03/29 0621) BP: 117/67 mmHg (03/29 1443) Pulse Rate: 72 (03/29 0621) Intake/Output from previous day: 03/28 0701 - 03/29 0700 In: 2500 [P.O.:100; I.V.:2400] Out: 600 [Urine:600] Intake/Output from this shift:    Labs:  Recent Labs  08/09/14 2153 08/10/14 0345  WBC 16.1* 13.8*  HGB 10.2* 9.4*  PLT 255 218  CREATININE 2.44* 2.02*   Estimated Creatinine Clearance: 23.7 mL/min (by C-G formula based on Cr of 2.02). No results for input(s): VANCOTROUGH, VANCOPEAK, VANCORANDOM, GENTTROUGH, GENTPEAK, GENTRANDOM, TOBRATROUGH, TOBRAPEAK, TOBRARND, AMIKACINPEAK, AMIKACINTROU, AMIKACIN in the last 72 hours.   Microbiology: Recent Results (from the past 720 hour(s))  Blood culture (routine x 2)     Status: None (Preliminary result)   Collection Time: 08/10/14 12:38 AM  Result Value Ref Range Status   Specimen Description BLOOD RIGHT ANTECUBITAL  Final   Special Requests   Final    BOTTLES DRAWN AEROBIC AND ANAEROBIC AEB 10CC ANA [place]   Culture PENDING  Incomplete   Report Status PENDING  Incomplete  Blood culture (routine x 2)     Status: None (Preliminary result)   Collection Time: 08/10/14 12:43 AM  Result Value Ref Range Status   Specimen Description BLOOD LEFT ANTECUBITAL  Final   Special Requests   Final    BOTTLES DRAWN AEROBIC AND ANAEROBIC AEB 10CC ANA 8CC   Culture PENDING  Incomplete   Report Status PENDING  Incomplete    Anti-infectives    Start     Dose/Rate Route Frequency Ordered Stop   08/10/14 0400  ceFEPIme (MAXIPIME) 1 g in dextrose 5 % 50 mL IVPB      1 g 100 mL/hr over 30 Minutes Intravenous Every 8 hours 08/10/14 0326     08/10/14 0330  acyclovir (ZOVIRAX) 200 MG capsule 400 mg     400 mg Oral 2 times daily 08/10/14 0325     08/10/14 0030  acyclovir (ZOVIRAX) tablet 400 mg  Status:  Discontinued     400 mg Oral 2 times daily 08/10/14 0022 08/10/14 0324   08/10/14 0030  ceFEPIme (MAXIPIME) 1 g in dextrose 5 % 50 mL IVPB  Status:  Discontinued     1 g 100 mL/hr over 30 Minutes Intravenous 3 times per day 08/10/14 0024 08/10/14 0326   08/10/14 0000  vancomycin (VANCOCIN) IVPB 1000 mg/200 mL premix     1,000 mg 200 mL/hr over 60 Minutes Intravenous  Once 08/09/14 2345 08/10/14 0415   08/10/14 0000  piperacillin-tazobactam (ZOSYN) IVPB 3.375 g     3.375 g 12.5 mL/hr over 240 Minutes Intravenous  Once 08/09/14 2345 08/10/14 0225     Assessment: [name] currently on chemo for CLL presented to ED with worsening cough.  He was been on Augmentin since 3/24 as an outpatient.  CXR now + possible PNA.   He is afebrile and WBC trending down.  Scr elevated on admission, but improving with hydration.   Cx data pending.   Augmentin 3/24>>3/28 Vancomycin 3/29>> Cefepime 3/29>>  Goal of Therapy:  Vancomycin trough level 15-20 mcg/ml  Plan:  Decrease Cefepime 1gm IV q24h Vancomycin 500mg  IV q24h Check Vancomycin trough at steady state Monitor renal function and cx data   Randall Reyes 08/10/2014,8:23 AM

## 2014-08-10 NOTE — Progress Notes (Signed)
Pt c/o chronic left ankle pain. No PRN pain meds ordered at this time. Paged Dr. Cindie Laroche and new orders received. Will follow.

## 2014-08-10 NOTE — ED Notes (Signed)
Improving, eating peanutbutter crackers and diet coke. Now warm and dry

## 2014-08-10 NOTE — Progress Notes (Signed)
INITIAL NUTRITION ASSESSMENT Pt meets criteria for MODERATE MALNUTRITION in the context of CHRONIC ILLNESS as evidenced by reportedly consuming <75% of est needs for >87month and mild muscle loss. DOCUMENTATION CODES Per approved criteria  -Non-severe (moderate) malnutrition in the context of chronic illness   INTERVENTION: -Ensure Enlive po BID, each supplement provides 350 kcal and 20 grams of protein  -Recommend MVI  -Gave handouts regarding cancer patients exhibiting "poor appetite", "dehydration", and "taste changes"  -Provided coupons for Boost/Ensure  -Provided my contact information to contact me if needed once discharged  NUTRITION DIAGNOSIS: Inadequate oral intake related to taste changes as evidenced by loss of 5 pounds in last 3 months.   Goal: Pt to meet >/= 90% of their estimated nutrition needs   Monitor:  Oral intake, weights, side effects of cancer treatments, labs  Reason for Assessment: MST   79 y.o. male  Admitting Dx: <principal problem not specified>  ASSESSMENT: 79 y.o. year old male with significant past medical history of CLL, DM, HTN presenting with weakness, HCAP. Patient has had progressive weakness, fatigue, decreased appetite over past 2 months during chemo.  Spoke to patient and wife. Wife reports patient has been eating and drinking very little for the past 2-3 months. His poor appetite started in December when he had the shingles. She stated he is eating and drinking about a quarter of what he did before that.  She stated that in the past few weeks he has been having 1-2 ensure-ice cream milkshakes a day.Pt states the reasons for his poor intake are taste changes and no appetite. Pt does not take any vitamin or mineral supplement.   Wife states pt's normal weight is 148. She says she notices he has lost weight around his stomach and in his legs.    Educated patient on the importance of maintaing adequate hydration consuming foods high in protein  in calories while he is under going cancer treatment ie PB, cheese, eggs. Also reccommended patient start mvi/b complex    Nutrition Focused Physical Exam:  Subcutaneous Fat:  Orbital Region: well nourished Upper Arm Region: well nourished Thoracic and Lumbar Region: mild loss (per wife)  Muscle:  Temple Region: well nourished Clavicle Bone Region: well nourished Clavicle and Acromion Bone Region: well nourished Scapular Bone Region: n/a Dorsal Hand: well nourished Patellar Region: well nourished Anterior Thigh Region: mild muscle loss Posterior Calf Region: mild muscle loss  Edema: non-pitting   Height: Ht Readings from Last 1 Encounters:  08/09/14 5\' 2"  (1.575 m)    Weight: Wt Readings from Last 1 Encounters:  08/10/14 125 lb 10.6 oz (57 kg)    Ideal Body Weight: 118 lbs  % Ideal Body Weight: 106%  Wt Readings from Last 10 Encounters:  08/10/14 125 lb 10.6 oz (57 kg)  08/05/14 125 lb 6.4 oz (56.881 kg)  07/22/14 129 lb 11.2 oz (58.832 kg)  07/13/14 131 lb 12.8 oz (59.784 kg)  07/05/14 129 lb 8 oz (58.741 kg)  05/28/14 132 lb 14.4 oz (60.283 kg)  04/22/14 130 lb 12.8 oz (59.33 kg)  04/13/14 135 lb 9.3 oz (61.5 kg)  03/25/14 132 lb 9.6 oz (60.147 kg)  03/02/14 132 lb 8 oz (60.102 kg)  loss of ~5 pound in 3 months  Usual Body Weight: 148  % Usual Body Weight: 84%  BMI:  Body mass index is 22.98 kg/(m^2).  Estimated Nutritional Needs: Kcal: 1800-2000 (32-35 kcal/kg) Protein: 74-86 (1.3-1.5 g/kg) Fluid: 1.8-2 liters  Skin: Abrasion left knee  Diet Order: Diet heart healthy/carb modified Room service appropriate?: Yes; Fluid consistency:: Thin  EDUCATION NEEDS: -Education needs addressed   Intake/Output Summary (Last 24 hours) at 08/10/14 1028 Last data filed at 08/10/14 1015  Gross per 24 hour  Intake   2500 ml  Output    750 ml  Net   1750 ml    Last BM: 3/28   Labs:   Recent Labs Lab 08/05/14 0950 08/09/14 2153 08/10/14 0345  NA  131* 129* 132*  K 5.7* 6.0* 4.7  CL 98 99 105  CO2 24 24 22   BUN 35* 34* 26*  CREATININE 2.27* 2.44* 2.02*  CALCIUM 9.1 8.4 8.0*  GLUCOSE 91 104* 152*    CBG (last 3)   Recent Labs  08/10/14 0255 08/10/14 0452 08/10/14 0733  GLUCAP 196* 114* 101*    Scheduled Meds: . acyclovir  400 mg Oral BID  . albuterol  2.5 mg Nebulization Q4H  . [START ON 08/11/2014] ceFEPime (MAXIPIME) IV  1 g Intravenous Q24H  . feeding supplement (ENSURE ENLIVE)  237 mL Oral BID BM  . guaiFENesin  1,200 mg Oral BID  . heparin  5,000 Units Subcutaneous 3 times per day  . ibrutinib  280 mg Oral Daily  . insulin aspart  0-9 Units Subcutaneous 6 times per day  . rosuvastatin  20 mg Oral Daily  . [START ON 08/11/2014] vancomycin  500 mg Intravenous Q24H    Continuous Infusions:   Past Medical History  Diagnosis Date  . Diabetes mellitus   . Leukemia 6.22.2012    PER PATIENT  . Hypertension   . Ulcer   . Anemia   . CLL (chronic lymphocytic leukemia) 01/24/2011  . Melanoma in situ 01/24/2011  . DM (diabetes mellitus) 01/24/2011  . Mild obesity 01/24/2011  . Eczema 01/24/2011  . Port catheter in place 05/02/2012  . Shingles   . History of pneumonia 04/2014    Past Surgical History  Procedure Laterality Date  . Hernia repair  2001  . Bleeding ulcer    . Portacath placement  2008  . Cataract extraction w/phaco  05/05/2012    Procedure: CATARACT EXTRACTION PHACO AND INTRAOCULAR LENS PLACEMENT (IOC);  Surgeon: Tonny Branch, MD;  Location: [facility];  Service: Ophthalmology;  Laterality: Right;  CDE:13.25  . Cataract extraction w/phaco  05/15/2012    Procedure: CATARACT EXTRACTION PHACO AND INTRAOCULAR LENS PLACEMENT (IOC);  Surgeon: Tonny Branch, MD;  Location: [facility];  Service: Ophthalmology;  Laterality: Left;  CDE:  12.32    Burtis Junes RD, LDN Nutrition Pager: [phone] 08/10/2014 10:28 AM

## 2014-08-10 NOTE — ED Notes (Signed)
Suddenly profusely diaphoretic, cbg checked, 43. Dextrose 50% 25gm given

## 2014-08-10 NOTE — Consult Note (Signed)
Consult requested by: Dr. Lorriane Shire Consult requested forpneumonia:  HPI: This is a 79 year old who came to the emergency department with weakness fatigue and decreased appetite and cough. He had taken Augmentin in. Please. He came to the emergency room and was found to have a chest x-ray with bibasilar pneumonia. He says he feels better. He is currently undergoing chemotherapy for CLL. He says his cough is better but not gone. He is not as short of breath.  Past Medical History  Diagnosis Date  . Diabetes mellitus   . Leukemia 6.22.2012    PER PATIENT  . Hypertension   . Ulcer   . Anemia   . CLL (chronic lymphocytic leukemia) 01/24/2011  . Melanoma in situ 01/24/2011  . DM (diabetes mellitus) 01/24/2011  . Mild obesity 01/24/2011  . Eczema 01/24/2011  . Port catheter in place 05/02/2012  . Shingles   . History of pneumonia 04/2014     Family History  Problem Relation Age of Onset  . Diabetes Father      History   Social History  . Marital Status: Married    Spouse Name: N/A  . Number of Children: N/A  . Years of Education: N/A   Social History Main Topics  . Smoking status: Former Research scientist (life sciences)  . Smokeless tobacco: Former Systems developer    Quit date: 04/25/1967  . Alcohol Use: No  . Drug Use: No  . Sexual Activity: Not on file   Other Topics Concern  . None   Social History Narrative     ROS: No chest pain. No hemoptysis. The rest as per the history and physical    Objective: Vital signs in last 24 hours: Temp:  [98.1 F (36.7 C)-98.6 F (37 C)] 98.2 F (36.8 C) (03/29 0621) Pulse Rate:  [72-111] 72 (03/29 0621) Resp:  [15-26] 18 (03/29 0621) BP: (92-119)/(53-74) 117/67 mmHg (03/29 0621) SpO2:  [72 %-100 %] 72 % (03/29 0621) Weight:  [56.7 kg (125 lb)-57 kg (125 lb 10.6 oz)] 57 kg (125 lb 10.6 oz) (03/29 0338) Weight change:  Last BM Date: 08/09/14  Intake/Output from previous day: 03/28 0701 - 03/29 0700 In: 2500 [P.O.:100; I.V.:2400] Out: 600  [Urine:600]  PHYSICAL EXAM He is awake and alert. He looks fairly comfortable. His HEENT exam is unremarkable. His neck is supple without masses. His chest shows some rhonchi bilaterally and slightly decreased breath sounds. His heart is regular without gallop. His abdomen is soft no masses are felt and extremities show no edema. Central nervous system exam is grossly intact  Lab Results: Basic Metabolic Panel:  Recent Labs  08/09/14 2153 08/10/14 0345  NA 129* 132*  K 6.0* 4.7  CL 99 105  CO2 24 22  GLUCOSE 104* 152*  BUN 34* 26*  CREATININE 2.44* 2.02*  CALCIUM 8.4 8.0*   Liver Function Tests:  Recent Labs  08/09/14 2153 08/10/14 0345  AST 33 25  ALT 29 25  ALKPHOS 115 103  BILITOT 0.6 0.5  PROT 7.5 6.6  ALBUMIN 2.5* 2.2*   No results for input(s): LIPASE, AMYLASE in the last 72 hours. No results for input(s): AMMONIA in the last 72 hours. CBC:  Recent Labs  08/09/14 2153 08/10/14 0345  WBC 16.1* 13.8*  NEUTROABS 8.2* 7.9*  HGB 10.2* 9.4*  HCT 31.6* 29.4*  MCV 110.9* 111.8*  PLT 255 218   Cardiac Enzymes:  Recent Labs  08/09/14 2153  TROPONINI <0.03   BNP: No results for input(s): PROBNP in the last 72  hours. D-Dimer: No results for input(s): DDIMER in the last 72 hours. CBG:  Recent Labs  08/09/14 2152 08/10/14 0142 08/10/14 0230 08/10/14 0255 08/10/14 0452 08/10/14 0733  GLUCAP 94 97 43* 196* 114* 101*   Hemoglobin A1C: No results for input(s): HGBA1C in the last 72 hours. Fasting Lipid Panel: No results for input(s): CHOL, HDL, LDLCALC, TRIG, CHOLHDL, LDLDIRECT in the last 72 hours. Thyroid Function Tests:  Recent Labs  08/09/14 2300  TSH 1.606   Anemia Panel: No results for input(s): VITAMINB12, FOLATE, FERRITIN, TIBC, IRON, RETICCTPCT in the last 72 hours. Coagulation: No results for input(s): LABPROT, INR in the last 72 hours. Urine Drug Screen: Drugs of Abuse  No results found for: LABOPIA, COCAINSCRNUR, LABBENZ,  AMPHETMU, THCU, LABBARB  Alcohol Level: No results for input(s): ETH in the last 72 hours. Urinalysis:  Recent Labs  08/09/14 2128  COLORURINE YELLOW  LABSPEC 1.015  PHURINE 7.5  GLUCOSEU NEGATIVE  HGBUR TRACE*  BILIRUBINUR NEGATIVE  KETONESUR NEGATIVE  PROTEINUR 30*  UROBILINOGEN 0.2  NITRITE NEGATIVE  LEUKOCYTESUR NEGATIVE   Misc. Labs:   ABGS: No results for input(s): PHART, PO2ART, TCO2, HCO3 in the last 72 hours.  Invalid input(s): PCO2   MICROBIOLOGY: Recent Results (from the past 240 hour(s))  Blood culture (routine x 2)     Status: None (Preliminary result)   Collection Time: 08/10/14 12:38 AM  Result Value Ref Range Status   Specimen Description BLOOD RIGHT ANTECUBITAL  Final   Special Requests   Final    BOTTLES DRAWN AEROBIC AND ANAEROBIC AEB 10CC ANA [place]   Culture PENDING  Incomplete   Report Status PENDING  Incomplete  Blood culture (routine x 2)     Status: None (Preliminary result)   Collection Time: 08/10/14 12:43 AM  Result Value Ref Range Status   Specimen Description BLOOD LEFT ANTECUBITAL  Final   Special Requests   Final    BOTTLES DRAWN AEROBIC AND ANAEROBIC AEB 10CC ANA [place]   Culture PENDING  Incomplete   Report Status PENDING  Incomplete    Studies/Results: Dg Chest 2 View  08/09/2014   CLINICAL DATA:  Acute onset of generalized weakness and decreased appetite. Initial encounter.  EXAM: CHEST  2 VIEW  COMPARISON:  Chest radiograph performed 08/05/2014  FINDINGS: The lungs are well-aerated. Mild bibasilar opacities may reflect atelectasis or possibly mild pneumonia. There is no evidence of pleural effusion or pneumothorax. A rounded density at the left lung base is thought reflect the overlying nipple shadow.  The heart is normal in size; the mediastinal contour is within normal limits. No acute osseous abnormalities are seen. There is chronic loss of height at the upper lumbar spine. A right-sided chest port is noted ending about the mid to  distal SVC.  IMPRESSION: Mild bibasilar airspace opacities may reflect atelectasis or possibly mild pneumonia.   Electronically Signed   By: [name] M.D.   On: 08/09/2014 22:31   Dg Ankle Complete Left  08/10/2014   CLINICAL DATA:  Chronic ankle pain for multiple years. No known injury.  EXAM: LEFT ANKLE COMPLETE - 3+ VIEW  COMPARISON:  None.  FINDINGS: No fracture or dislocation. The alignment and joint spaces are maintained. Mild degenerative change at the tibiotalar joint. No osseous destructive change. There is a plantar calcaneal spur. Mild soft tissue edema.  IMPRESSION: No acute bony abnormality. Mild degenerative change at the tibiotalar joint.   Electronically Signed   By: [name] M.D.   On:  08/10/2014 02:29    Medications:  Prior to Admission:  Prescriptions prior to admission  Medication Sig Dispense Refill Last Dose  . acetaminophen (TYLENOL) 325 MG tablet Take 650 mg by mouth every 6 (six) hours as needed. Pain.   08/08/2014 at Unknown time  . acyclovir (ZOVIRAX) 400 MG tablet Take 1 tablet (400 mg total) by mouth 2 (two) times daily. 60 tablet 6 08/09/2014 at Unknown time  . amoxicillin-clavulanate (AUGMENTIN) 875-125 MG per tablet Take 1 tablet by mouth 2 (two) times daily. 14 tablet 0 08/09/2014 at Unknown time  . bacitracin ointment Apply 1 application topically 2 (two) times daily. Fingers and feet   Past Week at Unknown time  . benzonatate (TESSALON) 200 MG capsule Take 1 capsule (200 mg total) by mouth 3 (three) times daily as needed for cough. 30 capsule 1 08/09/2014 at Unknown time  . Ca Carbonate-Mag Hydroxide (ROLAIDS PO) Take 1 tablet by mouth daily as needed (for heartburn).    Past Week at Unknown time  . Docusate Calcium (STOOL SOFTENER PO) Take 1 capsule by mouth daily as needed (constipation).    Past Month at Unknown time  . ibrutinib (IMBRUVICA) 140 MG capsul Take 2 capsules (280 mg total) by mouth daily. 60 capsule 0 08/09/2014 at 2000  . metFORMIN  (GLUCOPHAGE) 500 MG tablet Take 500-1,000 mg by mouth 2 (two) times daily with a meal. 2 tablets in the morning and 1 tablet in the evening.   08/09/2014 at Unknown time  . Phenyleph-Doxylamine-DM-APAP (ALKA SELTZER PLUS PO) Take 2 tablets by mouth daily as needed (for stomach upset).    08/09/2014 at Unknown time  . ranitidine (ZANTAC) 150 MG tablet Take 150 mg by mouth 3 (three) times daily with meals.   08/09/2014 at Unknown time  . rosuvastatin (CRESTOR) 20 MG tablet Take 20 mg by mouth daily.    08/09/2014 at Unknown time  . triamterene-hydrochlorothiazide (DYAZIDE) 37.5-25 MG per capsule Take 1 capsule by mouth daily.   08/09/2014 at Unknown time  . zolpidem (AMBIEN) 5 MG tablet Take 5 mg by mouth at bedtime as needed for sleep.   Taking  . HYDROcodone-acetaminophen (NORCO) 5-325 MG per tablet Take 1 tablet by mouth every 6 (six) hours as needed for moderate pain. (Patient not taking: Reported on 05/28/2014) 30 tablet 0 Not Taking   Scheduled: . acyclovir  400 mg Oral BID  . [START ON 08/11/2014] ceFEPime (MAXIPIME) IV  1 g Intravenous Q24H  . feeding supplement (ENSURE ENLIVE)  237 mL Oral BID BM  . heparin  5,000 Units Subcutaneous 3 times per day  . ibrutinib  280 mg Oral Daily  . insulin aspart  0-9 Units Subcutaneous 6 times per day  . rosuvastatin  20 mg Oral Daily  . [START ON 08/11/2014] vancomycin  500 mg Intravenous Q24H   Continuous:  DPO:EUMPNTIRWER, zolpidem  Assesment: He is admitted with healthcare associated pneumonia and that is complicated by his chronic lymphocytic leukemia. He seems to be dehydrated and also has acute kidney injury and hyperkalemia which are improving. He says he feels better now. Active Problems:   HCAP (healthcare-associated pneumonia)   AKI (acute kidney injury)   Hyperkalemia    Plan: Continue current treatments. I think he is on appropriate antibiotics etc. Add Mucinex and flutter valve and nebulizer treatments   LOS: 0 days   Esmond Hinch  L 08/10/2014, 8:23 AM

## 2014-08-10 NOTE — Care Management Note (Addendum)
    Page 1 of 1   08/12/2014     9:30:18 AM CARE MANAGEMENT NOTE 08/12/2014  Patient:  EBBIE, CHERRY   Account Number:  [id number]  Date Initiated:  08/10/2014  Documentation initiated by:  CHILDRESS,JESSICA  Subjective/Objective Assessment:   Pt is from home, lives with wife. Pt has a rollator but does not have a need to use it. Pt has no other DME's or [place] services. Pt plans to discharge home with self care. Will cont to follow.     Action/Plan:   Anticipated DC Date:  08/13/2014   Anticipated DC Plan:  [place]  CM consult      Choice offered to / List presented to:             Status of service:  Completed, signed off Medicare Important Message given?  YES (If response is "NO", the following Medicare IM given date fields will be blank) Date Medicare IM given:  08/12/2014 Medicare IM given by:  Jolene Provost Date Additional Medicare IM given:   Additional Medicare IM given by:    Discharge Disposition:  HOME/SELF CARE  Per UR Regulation:  Reviewed for med. necessity/level of care/duration of stay  If discussed at [place] of Stay Meetings, dates discussed:    Comments:  08/12/2014 [place], RN, MSN, CM Discharge anticipated for today. No CM needs. 08/10/2014 [place], RN, MSN, CM

## 2014-08-10 NOTE — Care Management Utilization Note (Signed)
UR completed 

## 2014-08-10 NOTE — ED Notes (Signed)
Much improved, eating/drinking no nausea

## 2014-08-11 DIAGNOSIS — E440 Moderate protein-calorie malnutrition: Secondary | ICD-10-CM | POA: Insufficient documentation

## 2014-08-11 LAB — CBC WITH DIFFERENTIAL/PLATELET
Basophils Absolute: 0 10*3/uL (ref 0.0–0.1)
Basophils Relative: 0 % (ref 0–1)
EOS ABS: 0.6 10*3/uL (ref 0.0–0.7)
Eosinophils Relative: 5 % (ref 0–5)
HCT: 28.3 % — ABNORMAL LOW (ref 39.0–52.0)
Hemoglobin: 9.2 g/dL — ABNORMAL LOW (ref 13.0–17.0)
Lymphocytes Relative: 37 % (ref 12–46)
Lymphs Abs: 4.8 10*3/uL — ABNORMAL HIGH (ref 0.7–4.0)
MCH: 35.9 pg — ABNORMAL HIGH (ref 26.0–34.0)
MCHC: 32.5 g/dL (ref 30.0–36.0)
MCV: 110.5 fL — AB (ref 78.0–100.0)
Monocytes Absolute: 0.9 10*3/uL (ref 0.1–1.0)
Monocytes Relative: 7 % (ref 3–12)
NEUTROS ABS: 6.6 10*3/uL (ref 1.7–7.7)
Neutrophils Relative %: 51 % (ref 43–77)
Platelets: 274 10*3/uL (ref 150–400)
RBC: 2.56 MIL/uL — AB (ref 4.22–5.81)
RDW: 15 % (ref 11.5–15.5)
WBC: 12.8 10*3/uL — ABNORMAL HIGH (ref 4.0–10.5)

## 2014-08-11 LAB — COMPREHENSIVE METABOLIC PANEL
ALBUMIN: 2.1 g/dL — AB (ref 3.5–5.2)
ALT: 23 U/L (ref 0–53)
ANION GAP: 7 (ref 5–15)
AST: 23 U/L (ref 0–37)
Alkaline Phosphatase: 91 U/L (ref 39–117)
BUN: 22 mg/dL (ref 6–23)
CALCIUM: 8.4 mg/dL (ref 8.4–10.5)
CHLORIDE: 105 mmol/L (ref 96–112)
CO2: 22 mmol/L (ref 19–32)
Creatinine, Ser: 1.49 mg/dL — ABNORMAL HIGH (ref 0.50–1.35)
GFR, EST AFRICAN AMERICAN: 50 mL/min — AB (ref >90)
GFR, EST NON AFRICAN AMERICAN: 44 mL/min — AB (ref >90)
Glucose, Bld: 113 mg/dL — ABNORMAL HIGH (ref 70–99)
POTASSIUM: 4.6 mmol/L (ref 3.5–5.1)
SODIUM: 134 mmol/L — AB (ref 135–145)
Total Bilirubin: 0.4 mg/dL (ref 0.3–1.2)
Total Protein: 6.7 g/dL (ref 6.0–8.3)

## 2014-08-11 LAB — URINE CULTURE
COLONY COUNT: NO GROWTH
Culture: NO GROWTH

## 2014-08-11 LAB — GLUCOSE, CAPILLARY
GLUCOSE-CAPILLARY: 111 mg/dL — AB (ref 70–99)
GLUCOSE-CAPILLARY: 113 mg/dL — AB (ref 70–99)
GLUCOSE-CAPILLARY: 124 mg/dL — AB (ref 70–99)
GLUCOSE-CAPILLARY: 147 mg/dL — AB (ref 70–99)
Glucose-Capillary: 169 mg/dL — ABNORMAL HIGH (ref 70–99)
Glucose-Capillary: 125 mg/dL — ABNORMAL HIGH (ref 70–99)

## 2014-08-11 LAB — LEGIONELLA ANTIGEN, URINE

## 2014-08-11 LAB — HEMOGLOBIN A1C
HEMOGLOBIN A1C: 6.1 % — AB (ref 4.8–5.6)
Mean Plasma Glucose: 128 mg/dL

## 2014-08-11 LAB — VITAMIN D 25 HYDROXY (VIT D DEFICIENCY, FRACTURES): Vit D, 25-Hydroxy: 19.7 ng/mL — ABNORMAL LOW (ref 30.0–100.0)

## 2014-08-11 LAB — HIV ANTIBODY (ROUTINE TESTING W REFLEX): HIV SCREEN 4TH GENERATION: NONREACTIVE

## 2014-08-11 NOTE — Progress Notes (Signed)
Subjective: He says he feels okay. He is coughing and coughing up some sputum. He is less short of breath.  Objective: Vital signs in last 24 hours: Temp:  [97.8 F (36.6 C)-98.8 F (37.1 C)] 98.8 F (37.1 C) (03/30 0544) Pulse Rate:  [87-101] 101 (03/30 0544) Resp:  [18] 18 (03/30 0544) BP: (95-121)/(63-75) 101/67 mmHg (03/30 0544) SpO2:  [100 %] 100 % (03/30 0651) Weight change:  Last BM Date: 08/10/14  Intake/Output from previous day: 03/29 0701 - 03/30 0700 In: 960 [P.O.:960] Out: 1200 [Urine:1200]  PHYSICAL EXAM General appearance: alert, cooperative and no distress Resp: rhonchi bilaterally Cardio: regular rate and rhythm, S1, S2 normal, no murmur, click, rub or gallop GI: soft, non-tender; bowel sounds normal; no masses,  no organomegaly Extremities: extremities normal, atraumatic, no cyanosis or edema  Lab Results:  Results for orders placed or performed during the hospital encounter of 08/09/14 (from the past 48 hour(s))  Urinalysis, Routine w reflex microscopic     Status: Abnormal   Collection Time: 08/09/14  9:28 PM  Result Value Ref Range   Color, Urine YELLOW YELLOW   APPearance CLEAR CLEAR   Specific Gravity, Urine 1.015 1.005 - 1.030   pH 7.5 5.0 - 8.0   Glucose, UA NEGATIVE NEGATIVE mg/dL   Hgb urine dipstick TRACE (A) NEGATIVE   Bilirubin Urine NEGATIVE NEGATIVE   Ketones, ur NEGATIVE NEGATIVE mg/dL   Protein, ur 30 (A) NEGATIVE mg/dL   Urobilinogen, UA 0.2 0.0 - 1.0 mg/dL   Nitrite NEGATIVE NEGATIVE   Leukocytes, UA NEGATIVE NEGATIVE  Urine microscopic-add on     Status: None   Collection Time: 08/09/14  9:28 PM  Result Value Ref Range   RBC / HPF 0-2 <3 RBC/hpf  POC CBG, ED     Status: None   Collection Time: 08/09/14  9:52 PM  Result Value Ref Range   Glucose-Capillary 94 70 - 99 mg/dL  Comprehensive metabolic panel     Status: Abnormal   Collection Time: 08/09/14  9:53 PM  Result Value Ref Range   Sodium 129 (L) 135 - 145 mmol/L    Potassium 6.0 (H) 3.5 - 5.1 mmol/L   Chloride 99 96 - 112 mmol/L   CO2 24 19 - 32 mmol/L   Glucose, Bld 104 (H) 70 - 99 mg/dL   BUN 34 (H) 6 - 23 mg/dL   Creatinine, Ser 2.44 (H) 0.50 - 1.35 mg/dL   Calcium 8.4 8.4 - 10.5 mg/dL   Total Protein 7.5 6.0 - 8.3 g/dL   Albumin 2.5 (L) 3.5 - 5.2 g/dL   AST 33 0 - 37 U/L   ALT 29 0 - 53 U/L   Alkaline Phosphatase 115 39 - 117 U/L   Total Bilirubin 0.6 0.3 - 1.2 mg/dL   GFR calc non Af Amer 24 (L) >90 mL/min   GFR calc Af Amer 28 (L) >90 mL/min    Comment: (NOTE) The eGFR has been calculated using the CKD EPI equation. This calculation has not been validated in all clinical situations. eGFR's persistently <90 mL/min signify possible Chronic Kidney Disease.    Anion gap 6 5 - 15  Troponin I     Status: None   Collection Time: 08/09/14  9:53 PM  Result Value Ref Range   Troponin I <0.03 <0.031 ng/mL    Comment:        NO INDICATION OF MYOCARDIAL INJURY.   CBC with Differential     Status: Abnormal   Collection  Time: 08/09/14  9:53 PM  Result Value Ref Range   WBC 16.1 (H) 4.0 - 10.5 K/uL    Comment: RESULT REPEATED AND VERIFIED   RBC 2.85 (L) 4.22 - 5.81 MIL/uL   Hemoglobin 10.2 (L) 13.0 - 17.0 g/dL   HCT 31.6 (L) 39.0 - 52.0 %   MCV 110.9 (H) 78.0 - 100.0 fL   MCH 35.8 (H) 26.0 - 34.0 pg   MCHC 32.3 30.0 - 36.0 g/dL   RDW 14.9 11.5 - 15.5 %   Platelets 255 150 - 400 K/uL   Neutrophils Relative % 51 43 - 77 %   Neutro Abs 8.2 (H) 1.7 - 7.7 K/uL   Lymphocytes Relative 34 12 - 46 %   Lymphs Abs 5.4 (H) 0.7 - 4.0 K/uL   Monocytes Relative 11 3 - 12 %   Monocytes Absolute 1.8 (H) 0.1 - 1.0 K/uL   Eosinophils Relative 4 0 - 5 %   Eosinophils Absolute 0.7 0.0 - 0.7 K/uL   Basophils Relative 0 0 - 1 %   Basophils Absolute 0.0 0.0 - 0.1 K/uL   WBC Morphology WHITE COUNT CONFIRMED ON SMEAR     Comment: ATYPICAL LYMPHOCYTES   RBC Morphology STOMATOCYTES     Comment: ELLIPTOCYTES   Smear Review LARGE PLATELETS PRESENT   Strep  pneumoniae urinary antigen     Status: None   Collection Time: 08/09/14 10:05 PM  Result Value Ref Range   Strep Pneumo Urinary Antigen NEGATIVE NEGATIVE    Comment:        Infection due to S. pneumoniae cannot be absolutely ruled out since the antigen present may be below the detection limit of the test. Performed at [facility]   Hemoglobin A1c     Status: Abnormal   Collection Time: 08/09/14 11:00 PM  Result Value Ref Range   Hgb A1c MFr Bld 6.1 (H) 4.8 - 5.6 %    Comment: (NOTE)         Pre-diabetes: 5.7 - 6.4         Diabetes: >6.4         Glycemic control for adults with diabetes: <7.0    Mean Plasma Glucose 128 mg/dL    Comment: (NOTE) Performed At: [facility] [place], [place] [id number] Lindon Romp MD [id number]   Vit D  25 hydroxy (routine osteoporosis monitoring)     Status: Abnormal   Collection Time: 08/09/14 11:00 PM  Result Value Ref Range   Vit D, 25-Hydroxy 19.7 (L) 30.0 - 100.0 ng/mL    Comment: (NOTE) Vitamin D deficiency has been defined by the Institute of Medicine and an Endocrine Society practice guideline as a level of serum 25-OH vitamin D less than 20 ng/mL (1,2). The Endocrine Society went on to further define vitamin D insufficiency as a level between 21 and 29 ng/mL (2). 1. IOM (Institute of Medicine). 2010. Dietary reference   intakes for calcium and D. [place]: The   [employer]. 2. Holick MF, Binkley Ashley, Bischoff-Ferrari HA, et al.   Evaluation, treatment, and prevention of vitamin D   deficiency: an Endocrine Society clinical practice   guideline. JCEM. 2011 Jul; 96(7):1911-30. Performed At: [facility] [place], [place] [id number] Lindon Romp MD ID:[id number]   TSH     Status: None   Collection Time: 08/09/14 11:00 PM  Result Value Ref Range   TSH 1.606 0.350 - 4.500 uIU/mL  Blood culture (routine  x 2)     Status: None (Preliminary result)    Collection Time: 08/10/14 12:38 AM  Result Value Ref Range   Specimen Description BLOOD RIGHT ANTECUBITAL    Special Requests      BOTTLES DRAWN AEROBIC AND ANAEROBIC AEB 10CC ANA [place]   Culture PENDING    Report Status PENDING   HIV antibody     Status: None   Collection Time: 08/10/14 12:38 AM  Result Value Ref Range   HIV Screen 4th Generation wRfx Non Reactive Non Reactive    Comment: (NOTE) Performed At: [facility] [place], [place] [id number] Lindon Romp MD [id number]   Lactic acid, plasma     Status: None   Collection Time: 08/10/14 12:38 AM  Result Value Ref Range   Lactic Acid, Venous 0.6 0.5 - 2.0 mmol/L  Blood culture (routine x 2)     Status: None (Preliminary result)   Collection Time: 08/10/14 12:43 AM  Result Value Ref Range   Specimen Description BLOOD LEFT ANTECUBITAL    Special Requests      BOTTLES DRAWN AEROBIC AND ANAEROBIC AEB 10CC ANA [place]   Culture PENDING    Report Status PENDING   POC CBG, ED     Status: None   Collection Time: 08/10/14  1:42 AM  Result Value Ref Range   Glucose-Capillary 97 70 - 99 mg/dL  CBG monitoring, ED     Status: Abnormal   Collection Time: 08/10/14  2:30 AM  Result Value Ref Range   Glucose-Capillary 43 (LL) 70 - 99 mg/dL   Comment 1 Notify RN    Comment 2 Call MD NNP PA CNM   Glucose, capillary     Status: Abnormal   Collection Time: 08/10/14  2:55 AM  Result Value Ref Range   Glucose-Capillary 196 (H) 70 - 99 mg/dL   Comment 1 Notify RN   CBC WITH DIFFERENTIAL     Status: Abnormal   Collection Time: 08/10/14  3:45 AM  Result Value Ref Range   WBC 13.8 (H) 4.0 - 10.5 K/uL    Comment: RESULT REPEATED AND VERIFIED   RBC 2.63 (L) 4.22 - 5.81 MIL/uL   Hemoglobin 9.4 (L) 13.0 - 17.0 g/dL   HCT 29.4 (L) 39.0 - 52.0 %   MCV 111.8 (H) 78.0 - 100.0 fL   MCH 35.7 (H) 26.0 - 34.0 pg   MCHC 32.0 30.0 - 36.0 g/dL   RDW 15.0 11.5 - 15.5 %   Platelets 218 150 - 400 K/uL   Neutrophils Relative %  57 43 - 77 %   Neutro Abs 7.9 (H) 1.7 - 7.7 K/uL   Lymphocytes Relative 27 12 - 46 %   Lymphs Abs 3.7 0.7 - 4.0 K/uL   Monocytes Relative 12 3 - 12 %   Monocytes Absolute 1.6 (H) 0.1 - 1.0 K/uL   Eosinophils Relative 4 0 - 5 %   Eosinophils Absolute 0.5 0.0 - 0.7 K/uL   Basophils Relative 0 0 - 1 %   Basophils Absolute 0.0 0.0 - 0.1 K/uL   WBC Morphology HYPERSEGMENTED NEUT     Comment: MILD LEFT SHIFT (1-5% METAS, OCC MYELO, OCC BANDS) ATYPICAL LYMPHOCYTES   Comprehensive metabolic panel     Status: Abnormal   Collection Time: 08/10/14  3:45 AM  Result Value Ref Range   Sodium 132 (L) 135 - 145 mmol/L   Potassium 4.7 3.5 - 5.1 mmol/L    Comment: DELTA CHECK NOTED  Chloride 105 96 - 112 mmol/L   CO2 22 19 - 32 mmol/L   Glucose, Bld 152 (H) 70 - 99 mg/dL   BUN 26 (H) 6 - 23 mg/dL   Creatinine, Ser 2.02 (H) 0.50 - 1.35 mg/dL   Calcium 8.0 (L) 8.4 - 10.5 mg/dL   Total Protein 6.6 6.0 - 8.3 g/dL   Albumin 2.2 (L) 3.5 - 5.2 g/dL   AST 25 0 - 37 U/L   ALT 25 0 - 53 U/L   Alkaline Phosphatase 103 39 - 117 U/L   Total Bilirubin 0.5 0.3 - 1.2 mg/dL   GFR calc non Af Amer 30 (L) >90 mL/min   GFR calc Af Amer 35 (L) >90 mL/min    Comment: (NOTE) The eGFR has been calculated using the CKD EPI equation. This calculation has not been validated in all clinical situations. eGFR's persistently <90 mL/min signify possible Chronic Kidney Disease.    Anion gap 5 5 - 15  Lactic acid, plasma     Status: None   Collection Time: 08/10/14  3:45 AM  Result Value Ref Range   Lactic Acid, Venous 1.4 0.5 - 2.0 mmol/L  Glucose, capillary     Status: Abnormal   Collection Time: 08/10/14  4:52 AM  Result Value Ref Range   Glucose-Capillary 114 (H) 70 - 99 mg/dL   Comment 1 Notify RN   Glucose, capillary     Status: Abnormal   Collection Time: 08/10/14  7:33 AM  Result Value Ref Range   Glucose-Capillary 101 (H) 70 - 99 mg/dL  Glucose, capillary     Status: Abnormal   Collection Time: 08/10/14  11:14 AM  Result Value Ref Range   Glucose-Capillary 137 (H) 70 - 99 mg/dL  Glucose, capillary     Status: Abnormal   Collection Time: 08/10/14  4:23 PM  Result Value Ref Range   Glucose-Capillary 195 (H) 70 - 99 mg/dL  Glucose, capillary     Status: Abnormal   Collection Time: 08/10/14  8:04 PM  Result Value Ref Range   Glucose-Capillary 122 (H) 70 - 99 mg/dL   Comment 1 Notify RN   Glucose, capillary     Status: Abnormal   Collection Time: 08/11/14 12:01 AM  Result Value Ref Range   Glucose-Capillary 113 (H) 70 - 99 mg/dL   Comment 1 Notify RN   Glucose, capillary     Status: Abnormal   Collection Time: 08/11/14  4:03 AM  Result Value Ref Range   Glucose-Capillary 124 (H) 70 - 99 mg/dL   Comment 1 Notify RN   CBC WITH DIFFERENTIAL     Status: Abnormal   Collection Time: 08/11/14  5:27 AM  Result Value Ref Range   WBC 12.8 (H) 4.0 - 10.5 K/uL   RBC 2.56 (L) 4.22 - 5.81 MIL/uL   Hemoglobin 9.2 (L) 13.0 - 17.0 g/dL   HCT 28.3 (L) 39.0 - 52.0 %   MCV 110.5 (H) 78.0 - 100.0 fL   MCH 35.9 (H) 26.0 - 34.0 pg   MCHC 32.5 30.0 - 36.0 g/dL   RDW 15.0 11.5 - 15.5 %   Platelets 274 150 - 400 K/uL   Neutrophils Relative % 51 43 - 77 %   Neutro Abs 6.6 1.7 - 7.7 K/uL   Lymphocytes Relative 37 12 - 46 %   Lymphs Abs 4.8 (H) 0.7 - 4.0 K/uL   Monocytes Relative 7 3 - 12 %   Monocytes Absolute 0.9 0.1 -  1.0 K/uL   Eosinophils Relative 5 0 - 5 %   Eosinophils Absolute 0.6 0.0 - 0.7 K/uL   Basophils Relative 0 0 - 1 %   Basophils Absolute 0.0 0.0 - 0.1 K/uL  Comprehensive metabolic panel     Status: Abnormal   Collection Time: 08/11/14  5:27 AM  Result Value Ref Range   Sodium 134 (L) 135 - 145 mmol/L   Potassium 4.6 3.5 - 5.1 mmol/L   Chloride 105 96 - 112 mmol/L   CO2 22 19 - 32 mmol/L   Glucose, Bld 113 (H) 70 - 99 mg/dL   BUN 22 6 - 23 mg/dL   Creatinine, Ser 1.49 (H) 0.50 - 1.35 mg/dL   Calcium 8.4 8.4 - 10.5 mg/dL   Total Protein 6.7 6.0 - 8.3 g/dL   Albumin 2.1 (L) 3.5 -  5.2 g/dL   AST 23 0 - 37 U/L   ALT 23 0 - 53 U/L   Alkaline Phosphatase 91 39 - 117 U/L   Total Bilirubin 0.4 0.3 - 1.2 mg/dL   GFR calc non Af Amer 44 (L) >90 mL/min   GFR calc Af Amer 50 (L) >90 mL/min    Comment: (NOTE) The eGFR has been calculated using the CKD EPI equation. This calculation has not been validated in all clinical situations. eGFR's persistently <90 mL/min signify possible Chronic Kidney Disease.    Anion gap 7 5 - 15  Glucose, capillary     Status: Abnormal   Collection Time: 08/11/14  7:40 AM  Result Value Ref Range   Glucose-Capillary 111 (H) 70 - 99 mg/dL    ABGS No results for input(s): PHART, PO2ART, TCO2, HCO3 in the last 72 hours.  Invalid input(s): PCO2 CULTURES Recent Results (from the past 240 hour(s))  Blood culture (routine x 2)     Status: None (Preliminary result)   Collection Time: 08/10/14 12:38 AM  Result Value Ref Range Status   Specimen Description BLOOD RIGHT ANTECUBITAL  Final   Special Requests   Final    BOTTLES DRAWN AEROBIC AND ANAEROBIC AEB 10CC ANA [place]   Culture PENDING  Incomplete   Report Status PENDING  Incomplete  Blood culture (routine x 2)     Status: None (Preliminary result)   Collection Time: 08/10/14 12:43 AM  Result Value Ref Range Status   Specimen Description BLOOD LEFT ANTECUBITAL  Final   Special Requests   Final    BOTTLES DRAWN AEROBIC AND ANAEROBIC AEB 10CC ANA [place]   Culture PENDING  Incomplete   Report Status PENDING  Incomplete   Studies/Results: Dg Chest 2 View  08/09/2014   CLINICAL DATA:  Acute onset of generalized weakness and decreased appetite. Initial encounter.  EXAM: CHEST  2 VIEW  COMPARISON:  Chest radiograph performed 08/05/2014  FINDINGS: The lungs are well-aerated. Mild bibasilar opacities may reflect atelectasis or possibly mild pneumonia. There is no evidence of pleural effusion or pneumothorax. A rounded density at the left lung base is thought reflect the overlying nipple shadow.  The  heart is normal in size; the mediastinal contour is within normal limits. No acute osseous abnormalities are seen. There is chronic loss of height at the upper lumbar spine. A right-sided chest port is noted ending about the mid to distal SVC.  IMPRESSION: Mild bibasilar airspace opacities may reflect atelectasis or possibly mild pneumonia.   Electronically Signed   By: [name] M.D.   On: 08/09/2014 22:31   Dg Ankle Complete Left  08/10/2014   CLINICAL DATA:  Chronic ankle pain for multiple years. No known injury.  EXAM: LEFT ANKLE COMPLETE - 3+ VIEW  COMPARISON:  None.  FINDINGS: No fracture or dislocation. The alignment and joint spaces are maintained. Mild degenerative change at the tibiotalar joint. No osseous destructive change. There is a plantar calcaneal spur. Mild soft tissue edema.  IMPRESSION: No acute bony abnormality. Mild degenerative change at the tibiotalar joint.   Electronically Signed   By: [name] M.D.   On: 08/10/2014 02:29   US Renal  08/10/2014   CLINICAL DATA:  79 year old male with acute renal injury, currently on chemotherapy for leukemia. Initial encounter.  EXAM: RENAL/URINARY TRACT ULTRASOUND COMPLETE  COMPARISON:  CT Abdomen and Pelvis 07/28/2012.  FINDINGS: Right Kidney:  Length: 8.6 cm. Mildly increased cortical echogenicity (image 2). No hydronephrosis or renal mass.  Left Kidney:  Length: 8.4 cm. Cortical echogenicity probably mildly increased similar to that on the right. No hydronephrosis or renal mass.  Bladder:  Appears normal for degree of bladder distention.  IMPRESSION: Increased renal cortical echogenicity suggesting a degree of chronic medical renal disease. No hydronephrosis or acute sonographic finding.   Electronically Signed   By: Genevie Ann M.D.   On: 08/10/2014 08:21    Medications:  Prior to Admission:  Prescriptions prior to admission  Medication Sig Dispense Refill Last Dose  . acetaminophen (TYLENOL) 325 MG tablet Take 650 mg by mouth  every 6 (six) hours as needed. Pain.   08/08/2014 at Unknown time  . acyclovir (ZOVIRAX) 400 MG tablet Take 1 tablet (400 mg total) by mouth 2 (two) times daily. 60 tablet 6 08/09/2014 at Unknown time  . amoxicillin-clavulanate (AUGMENTIN) 875-125 MG per tablet Take 1 tablet by mouth 2 (two) times daily. 14 tablet 0 08/09/2014 at Unknown time  . bacitracin ointment Apply 1 application topically 2 (two) times daily. Fingers and feet   Past Week at Unknown time  . benzonatate (TESSALON) 200 MG capsule Take 1 capsule (200 mg total) by mouth 3 (three) times daily as needed for cough. 30 capsule 1 08/09/2014 at Unknown time  . Ca Carbonate-Mag Hydroxide (ROLAIDS PO) Take 1 tablet by mouth daily as needed (for heartburn).    Past Week at Unknown time  . Docusate Calcium (STOOL SOFTENER PO) Take 1 capsule by mouth daily as needed (constipation).    Past Month at Unknown time  . ibrutinib (IMBRUVICA) 140 MG capsul Take 2 capsules (280 mg total) by mouth daily. 60 capsule 0 08/09/2014 at 2000  . metFORMIN (GLUCOPHAGE) 500 MG tablet Take 500-1,000 mg by mouth 2 (two) times daily with a meal. 2 tablets in the morning and 1 tablet in the evening.   08/09/2014 at Unknown time  . Phenyleph-Doxylamine-DM-APAP (ALKA SELTZER PLUS PO) Take 2 tablets by mouth daily as needed (for stomach upset).    08/09/2014 at Unknown time  . ranitidine (ZANTAC) 150 MG tablet Take 150 mg by mouth 3 (three) times daily with meals.   08/09/2014 at Unknown time  . rosuvastatin (CRESTOR) 20 MG tablet Take 20 mg by mouth daily.    08/09/2014 at Unknown time  . triamterene-hydrochlorothiazide (DYAZIDE) 37.5-25 MG per capsule Take 1 capsule by mouth daily.   08/09/2014 at Unknown time  . zolpidem (AMBIEN) 5 MG tablet Take 5 mg by mouth at bedtime as needed for sleep.   Taking  . HYDROcodone-acetaminophen (NORCO) 5-325 MG per tablet Take 1 tablet by mouth every 6 (six) hours as needed  for moderate pain. (Patient not taking: Reported on 05/28/2014) 30  tablet 0 Not Taking   Scheduled: . acyclovir  400 mg Oral BID  . albuterol  2.5 mg Nebulization TID  . ceFEPime (MAXIPIME) IV  1 g Intravenous Q24H  . feeding supplement (ENSURE ENLIVE)  237 mL Oral BID BM  . guaiFENesin  1,200 mg Oral BID  . heparin  5,000 Units Subcutaneous 3 times per day  . ibrutinib  280 mg Oral Daily  . insulin aspart  0-9 Units Subcutaneous 6 times per day  . rosuvastatin  20 mg Oral Daily  . vancomycin  500 mg Intravenous Q24H   Continuous:  RFX:[id number], benzonatate, zolpidem  Assesment: He is admitted with healthcare associated pneumonia. He seems to be improving. This situation is complicated by the fact that he has chronic lymphocytic leukemia which is being treated. Active Problems:   HCAP (healthcare-associated pneumonia)   AKI (acute kidney injury)   Hyperkalemia   Malnutrition of moderate degree    Plan: Continue current treatments. He does seem improved from yesterday.    LOS: 1 day   Darely Becknell L 08/11/2014, 8:52 AM

## 2014-08-11 NOTE — Progress Notes (Signed)
Patient with healthcare associated pneumonia in the face of CLL. To be improving clinically he likewise had some prerenal aki. Leukocytosis improving down to 13 hemoglobin 9.5 creatinine down to 2.0 to ARVAL BRANDSTETTER FWY:[id number] DOB: 27-Dec-1935 DOA: 08/09/2014 PCP: [name], MD             Physical Exam: Blood pressure 101/67, pulse 101, temperature 98.8 F (37.1 C), temperature source Oral, resp. rate 18, height _0  (1.575 m), weight 125 lb 10.6 oz (57 kg), SpO2 100 %. no JVD no carotid bruits lungs show diminished breath sounds in the bases no rhonchi no rales no wheezes appreciable morning heart regular rhythm no S3 or S4 no heaves thrills rubs   Investigations:  Recent Results (from the past 240 hour(s))  Blood culture (routine x 2)     Status: None (Preliminary result)   Collection Time: 08/10/14 12:38 AM  Result Value Ref Range Status   Specimen Description BLOOD RIGHT ANTECUBITAL  Final   Special Requests   Final    BOTTLES DRAWN AEROBIC AND ANAEROBIC AEB 10CC ANA [place]   Culture PENDING  Incomplete   Report Status PENDING  Incomplete  Blood culture (routine x 2)     Status: None (Preliminary result)   Collection Time: 08/10/14 12:43 AM  Result Value Ref Range Status   Specimen Description BLOOD LEFT ANTECUBITAL  Final   Special Requests   Final    BOTTLES DRAWN AEROBIC AND ANAEROBIC AEB 10CC ANA 8CC   Culture PENDING  Incomplete   Report Status PENDING  Incomplete     Basic Metabolic Panel:  Recent Labs  08/09/14 2153 08/10/14 0345  NA 129* 132*  K 6.0* 4.7  CL 99 105  CO2 24 22  GLUCOSE 104* 152*  BUN 34* 26*  CREATININE 2.44* 2.02*  CALCIUM 8.4 8.0*   Liver Function Tests:  Recent Labs  08/09/14 2153 08/10/14 0345  AST 33 25  ALT 29 25  ALKPHOS 115 103  BILITOT 0.6 0.5  PROT 7.5 6.6  ALBUMIN 2.5* 2.2*     CBC:  Recent Labs  08/09/14 2153 08/10/14 0345  WBC 16.1* 13.8*  NEUTROABS 8.2* 7.9*  HGB 10.2* 9.4*  HCT 31.6*  29.4*  MCV 110.9* 111.8*  PLT 255 218    Dg Chest 2 View  08/09/2014   CLINICAL DATA:  Acute onset of generalized weakness and decreased appetite. Initial encounter.  EXAM: CHEST  2 VIEW  COMPARISON:  Chest radiograph performed 08/05/2014  FINDINGS: The lungs are well-aerated. Mild bibasilar opacities may reflect atelectasis or possibly mild pneumonia. There is no evidence of pleural effusion or pneumothorax. A rounded density at the left lung base is thought reflect the overlying nipple shadow.  The heart is normal in size; the mediastinal contour is within normal limits. No acute osseous abnormalities are seen. There is chronic loss of height at the upper lumbar spine. A right-sided chest port is noted ending about the mid to distal SVC.  IMPRESSION: Mild bibasilar airspace opacities may reflect atelectasis or possibly mild pneumonia.   Electronically Signed   By: [name] M.D.   On: 08/09/2014 22:31   Dg Ankle Complete Left  08/10/2014   CLINICAL DATA:  Chronic ankle pain for multiple years. No known injury.  EXAM: LEFT ANKLE COMPLETE - 3+ VIEW  COMPARISON:  None.  FINDINGS: No fracture or dislocation. The alignment and joint spaces are maintained. Mild degenerative change at the tibiotalar joint. No osseous destructive change. There is  a plantar calcaneal spur. Mild soft tissue edema.  IMPRESSION: No acute bony abnormality. Mild degenerative change at the tibiotalar joint.   Electronically Signed   By: [name] M.D.   On: 08/10/2014 02:29   US Renal  08/10/2014   CLINICAL DATA:  79 year old male with acute renal injury, currently on chemotherapy for leukemia. Initial encounter.  EXAM: RENAL/URINARY TRACT ULTRASOUND COMPLETE  COMPARISON:  CT Abdomen and Pelvis 07/28/2012.  FINDINGS: Right Kidney:  Length: 8.6 cm. Mildly increased cortical echogenicity (image 2). No hydronephrosis or renal mass.  Left Kidney:  Length: 8.4 cm. Cortical echogenicity probably mildly increased similar to that  on the right. No hydronephrosis or renal mass.  Bladder:  Appears normal for degree of bladder distention.  IMPRESSION: Increased renal cortical echogenicity suggesting a degree of chronic medical renal disease. No hydronephrosis or acute sonographic finding.   Electronically Signed   By: Genevie Ann M.D.   On: 08/10/2014 08:21      Medications:   Impression:  Active Problems:   HCAP (healthcare-associated pneumonia)   AKI (acute kidney injury)   Hyperkalemia   Malnutrition of moderate degree     Plan: Continue vancomycin and Maxipime as well as nebulizer therapy CBC and be met ordered for a.m.   Consultants: Pulmonology   Procedures   Antibiotics: Vancomycin and Maxipime                  Code Status:   Family Communication:  Spoke with wife yesterday in room  Disposition Plan be met and CBC ordered for a.m. into new dual antibiotics patient improving  Time spent: 30 minutes   LOS: 1 day   Broedy Osbourne M   08/11/2014, 6:19 AM

## 2014-08-12 ENCOUNTER — Other Ambulatory Visit (HOSPITAL_COMMUNITY): Payer: Medicare Other

## 2014-08-12 LAB — CBC WITH DIFFERENTIAL/PLATELET
BASOS PCT: 0 % (ref 0–1)
Basophils Absolute: 0 10*3/uL (ref 0.0–0.1)
EOS ABS: 0.9 10*3/uL — AB (ref 0.0–0.7)
Eosinophils Relative: 7 % — ABNORMAL HIGH (ref 0–5)
HCT: 28 % — ABNORMAL LOW (ref 39.0–52.0)
Hemoglobin: 8.9 g/dL — ABNORMAL LOW (ref 13.0–17.0)
Lymphocytes Relative: 37 % (ref 12–46)
Lymphs Abs: 4.6 10*3/uL — ABNORMAL HIGH (ref 0.7–4.0)
MCH: 35 pg — AB (ref 26.0–34.0)
MCHC: 31.8 g/dL (ref 30.0–36.0)
MCV: 110.2 fL — ABNORMAL HIGH (ref 78.0–100.0)
Monocytes Absolute: 0.9 10*3/uL (ref 0.1–1.0)
Monocytes Relative: 7 % (ref 3–12)
Neutro Abs: 6 10*3/uL (ref 1.7–7.7)
Neutrophils Relative %: 48 % (ref 43–77)
PLATELETS: 297 10*3/uL (ref 150–400)
RBC: 2.54 MIL/uL — AB (ref 4.22–5.81)
RDW: 14.9 % (ref 11.5–15.5)
WBC: 12.3 10*3/uL — ABNORMAL HIGH (ref 4.0–10.5)

## 2014-08-12 LAB — COMPREHENSIVE METABOLIC PANEL
ALBUMIN: 2.1 g/dL — AB (ref 3.5–5.2)
ALK PHOS: 86 U/L (ref 39–117)
ALT: 25 U/L (ref 0–53)
ANION GAP: 7 (ref 5–15)
AST: 27 U/L (ref 0–37)
BILIRUBIN TOTAL: 0.6 mg/dL (ref 0.3–1.2)
BUN: 19 mg/dL (ref 6–23)
CHLORIDE: 105 mmol/L (ref 96–112)
CO2: 23 mmol/L (ref 19–32)
Calcium: 8.5 mg/dL (ref 8.4–10.5)
Creatinine, Ser: 1.45 mg/dL — ABNORMAL HIGH (ref 0.50–1.35)
GFR calc Af Amer: 52 mL/min — ABNORMAL LOW (ref >90)
GFR calc non Af Amer: 45 mL/min — ABNORMAL LOW (ref >90)
Glucose, Bld: 96 mg/dL (ref 70–99)
POTASSIUM: 5 mmol/L (ref 3.5–5.1)
SODIUM: 135 mmol/L (ref 135–145)
TOTAL PROTEIN: 6.7 g/dL (ref 6.0–8.3)

## 2014-08-12 LAB — GLUCOSE, CAPILLARY
GLUCOSE-CAPILLARY: 139 mg/dL — AB (ref 70–99)
Glucose-Capillary: 101 mg/dL — ABNORMAL HIGH (ref 70–99)
Glucose-Capillary: 137 mg/dL — ABNORMAL HIGH (ref 70–99)
Glucose-Capillary: 91 mg/dL (ref 70–99)
Glucose-Capillary: 97 mg/dL (ref 70–99)

## 2014-08-12 MED ORDER — ALBUTEROL SULFATE (2.5 MG/3ML) 0.083% IN NEBU: 2.5000 mg | INHALATION_SOLUTION | RESPIRATORY_TRACT | Status: DC | PRN

## 2014-08-12 NOTE — Discharge Summary (Signed)
Physician Discharge Summary  Randall Reyes CVE:[id number] DOB: May 13, 1936 DOA: 08/09/2014  PCP: [name], MD  Admit date: 08/09/2014 Discharge date: 08/12/2014     Discharge Diagnoses:  1. 1 bronchopneumonia bilateral 2. Acute kidney injury 3. Hyperkalemia 4. Chronic lymphocytic leukemia 5. Diabetes mellitus type 2 6. Leukocytosis 7. Hypertension essential  Discharge Condition: Stable Disposition: Home  Diet recommendation: 2000-calorie ADA diet  Filed Weights   08/09/14 2056 08/10/14 0338  Weight: 56.7 kg (125 lb) 57 kg (125 lb 10.6 oz)    History of present illness:  The patient has a history of CLL, DM, hypertension,. His been on chemotherapy for treatment of CLL. He had been experiencing weakness and fatigue for several weeks. He had developed chest congestion and cough prior to admission. He presented to ED subsequent chest x-ray showed evidence of bronchopneumonia bilateral Subsequently admitted  Hospital Course:  The patient was started on intravenous fluids will start also on IV vancomycin 500 mg every 24 hours Maxipime 1 g every 24 hours. On this regimen he progressively improved. He was seen in consultation by pulmonology Dr. Luan Pulling. The patient did have elevated potassium on admission was treated with calcium gluconate and 50% dextrose and water along with insulin. It was felt he could be discharged and follow up with oncology as outpatient. He was continued on medications listed below during hospitalization   Discharge Instructions The patient was asked to return to primary care physician's office in approximately one week and to continue medications listed below. He'll also be seen by oncology by appointment.    Medication List    STOP taking these medications        acetaminophen 325 MG tablet  Commonly known as:  TYLENOL     amoxicillin-clavulanate 875-125 MG per tablet  Commonly known as:  AUGMENTIN     bacitracin ointment      TAKE  these medications        acyclovir 400 MG tablet  Commonly known as:  ZOVIRAX  Take 1 tablet (400 mg total) by mouth 2 (two) times daily.     ALKA SELTZER PLUS PO  Take 2 tablets by mouth daily as needed (for stomach upset).     benzonatate 200 MG capsule  Commonly known as:  TESSALON  Take 1 capsule (200 mg total) by mouth 3 (three) times daily as needed for cough.     HYDROcodone-acetaminophen 5-325 MG per tablet  Commonly known as:  NORCO  Take 1 tablet by mouth every 6 (six) hours as needed for moderate pain.     ibrutinib 140 MG capsul  Commonly known as:  IMBRUVICA  Take 2 capsules (280 mg total) by mouth daily.     metFORMIN 500 MG tablet  Commonly known as:  GLUCOPHAGE  Take 500-1,000 mg by mouth 2 (two) times daily with a meal. 2 tablets in the morning and 1 tablet in the evening.     ranitidine 150 MG tablet  Commonly known as:  ZANTAC  Take 150 mg by mouth 3 (three) times daily with meals.     ROLAIDS PO  Take 1 tablet by mouth daily as needed (for heartburn).     rosuvastatin 20 MG tablet  Commonly known as:  CRESTOR  Take 20 mg by mouth daily.     STOOL SOFTENER PO  Take 1 capsule by mouth daily as needed (constipation).     triamterene-hydrochlorothiazide 37.5-25 MG per capsule  Commonly known as:  DYAZIDE  Take 1 capsule by  mouth daily.     zolpidem 5 MG tablet  Commonly known as:  AMBIEN  Take 5 mg by mouth at bedtime as needed for sleep.       Allergies  Allergen Reactions  . Aspirin Other (See Comments)    Bleeding ulcers.   . Ibuprofen Other (See Comments)    Bleeding ulcers.    The results of significant diagnostics from this hospitalization (including imaging, microbiology, ancillary and laboratory) are listed below for reference.    Significant Diagnostic Studies: Dg Chest 1 View  08/05/2014   CLINICAL DATA:  Cough, history of chronic lymphocytic leukemia, shortness of breath  EXAM: CHEST  1 VIEW  COMPARISON:  Chest x-ray of  04/14/2014  FINDINGS: No focal infiltrate or effusion is seen. Minimal left basilar linear atelectasis is noted. Mediastinal and hilar contours are unremarkable. A right-sided Port-A-Cath is present with the tip overlying the mid lower SVC. The heart is unchanged in size. No bony abnormality is seen.  IMPRESSION: Mild basilar linear atelectasis. Right-sided Port-A-Cath tip overlies the mid lower SVC.   Electronically Signed   By: [name] M.D.   On: 08/05/2014 13:10   Dg Chest 2 View  08/09/2014   CLINICAL DATA:  Acute onset of generalized weakness and decreased appetite. Initial encounter.  EXAM: CHEST  2 VIEW  COMPARISON:  Chest radiograph performed 08/05/2014  FINDINGS: The lungs are well-aerated. Mild bibasilar opacities may reflect atelectasis or possibly mild pneumonia. There is no evidence of pleural effusion or pneumothorax. A rounded density at the left lung base is thought reflect the overlying nipple shadow.  The heart is normal in size; the mediastinal contour is within normal limits. No acute osseous abnormalities are seen. There is chronic loss of height at the upper lumbar spine. A right-sided chest port is noted ending about the mid to distal SVC.  IMPRESSION: Mild bibasilar airspace opacities may reflect atelectasis or possibly mild pneumonia.   Electronically Signed   By: [name] M.D.   On: 08/09/2014 22:31   Dg Ankle Complete Left  08/10/2014   CLINICAL DATA:  Chronic ankle pain for multiple years. No known injury.  EXAM: LEFT ANKLE COMPLETE - 3+ VIEW  COMPARISON:  None.  FINDINGS: No fracture or dislocation. The alignment and joint spaces are maintained. Mild degenerative change at the tibiotalar joint. No osseous destructive change. There is a plantar calcaneal spur. Mild soft tissue edema.  IMPRESSION: No acute bony abnormality. Mild degenerative change at the tibiotalar joint.   Electronically Signed   By: [name] M.D.   On: 08/10/2014 02:29   US Renal  08/10/2014    CLINICAL DATA:  79 year old male with acute renal injury, currently on chemotherapy for leukemia. Initial encounter.  EXAM: RENAL/URINARY TRACT ULTRASOUND COMPLETE  COMPARISON:  CT Abdomen and Pelvis 07/28/2012.  FINDINGS: Right Kidney:  Length: 8.6 cm. Mildly increased cortical echogenicity (image 2). No hydronephrosis or renal mass.  Left Kidney:  Length: 8.4 cm. Cortical echogenicity probably mildly increased similar to that on the right. No hydronephrosis or renal mass.  Bladder:  Appears normal for degree of bladder distention.  IMPRESSION: Increased renal cortical echogenicity suggesting a degree of chronic medical renal disease. No hydronephrosis or acute sonographic finding.   Electronically Signed   By: Genevie Ann M.D.   On: 08/10/2014 08:21    Microbiology: Recent Results (from the past 240 hour(s))  Urine culture     Status: None   Collection Time: 08/09/14  9:28 PM  Result Value Ref Range Status   Specimen Description URINE, CLEAN CATCH  Final   Special Requests NONE  Final   Colony Count NO GROWTH Performed at [facility]   Final   Culture NO GROWTH Performed at [facility]   Final   Report Status 08/11/2014 FINAL  Final  Blood culture (routine x 2)     Status: None (Preliminary result)   Collection Time: 08/10/14 12:38 AM  Result Value Ref Range Status   Specimen Description BLOOD RIGHT ANTECUBITAL  Final   Special Requests   Final    BOTTLES DRAWN AEROBIC AND ANAEROBIC AEB 10CC ANA [place]   Culture PENDING  Incomplete   Report Status PENDING  Incomplete  Blood culture (routine x 2)     Status: None (Preliminary result)   Collection Time: 08/10/14 12:43 AM  Result Value Ref Range Status   Specimen Description BLOOD LEFT ANTECUBITAL  Final   Special Requests   Final    BOTTLES DRAWN AEROBIC AND ANAEROBIC AEB 10CC ANA 8CC   Culture PENDING  Incomplete   Report Status PENDING  Incomplete     Labs: Basic Metabolic Panel:  Recent Labs Lab 08/09/14 2153  08/10/14 0345 08/11/14 0527 08/12/14 0524  NA 129* 132* 134* 135  K 6.0* 4.7 4.6 5.0  CL 99 105 105 105  CO2 24 22 22 23   GLUCOSE 104* 152* 113* 96  BUN 34* 26* 22 19  CREATININE 2.44* 2.02* 1.49* 1.45*  CALCIUM 8.4 8.0* 8.4 8.5   Liver Function Tests:  Recent Labs Lab 08/09/14 2153 08/10/14 0345 08/11/14 0527 08/12/14 0524  AST 33 25 23 27   ALT 29 25 23 25   ALKPHOS 115 103 91 86  BILITOT 0.6 0.5 0.4 0.6  PROT 7.5 6.6 6.7 6.7  ALBUMIN 2.5* 2.2* 2.1* 2.1*   No results for input(s): LIPASE, AMYLASE in the last 168 hours. No results for input(s): AMMONIA in the last 168 hours. CBC:  Recent Labs Lab 08/09/14 2153 08/10/14 0345 08/11/14 0527 08/12/14 0524  WBC 16.1* 13.8* 12.8* 12.3*  NEUTROABS 8.2* 7.9* 6.6 6.0  HGB 10.2* 9.4* 9.2* 8.9*  HCT 31.6* 29.4* 28.3* 28.0*  MCV 110.9* 111.8* 110.5* 110.2*  PLT 255 218 274 297   Cardiac Enzymes:  Recent Labs Lab 08/09/14 2153  TROPONINI <0.03   BNP: BNP (last 3 results) No results for input(s): BNP in the last 8760 hours.  ProBNP (last 3 results) No results for input(s): PROBNP in the last 8760 hours.  CBG:  Recent Labs Lab 08/12/14 0026 08/12/14 0436 08/12/14 0710 08/12/14 1151 08/12/14 1633  GLUCAP 101* 91 97 137* 139*    Active Problems:   HCAP (healthcare-associated pneumonia)   AKI (acute kidney injury)   Hyperkalemia   Malnutrition of moderate degree   Time coordinating discharge: 30 minutes Signed:  Marjean Donna, MD 08/12/2014, 5:31 PM

## 2014-08-12 NOTE — Progress Notes (Signed)
Pt discharged home today per Dr. Everette Rank. Pt's IV site D/C'd and WDL. Pt's VSS. Pt provided with home medication list, discharge instructions and prescriptions. Verbalized understanding. Pt left floor via WC in stable condition accompanied by NT.

## 2014-08-12 NOTE — Progress Notes (Signed)
Subjective: He says he feels okay. He has no new complaints. His breathing is better.  Objective: Vital signs in last 24 hours: Temp:  [97.7 F (36.5 C)-98.5 F (36.9 C)] 98.5 F (36.9 C) (03/31 0532) Pulse Rate:  [101-104] 104 (03/31 0532) Resp:  [18-20] 20 (03/31 0532) BP: (110-131)/(57-75) 131/69 mmHg (03/31 0532) SpO2:  [96 %-100 %] 96 % (03/31 0719) Weight change:  Last BM Date: 08/11/14  Intake/Output from previous day: 03/30 0701 - 03/31 0700 In: 720 [P.O.:720] Out: 350 [Urine:350]  PHYSICAL EXAM General appearance: alert, cooperative and no distress Resp: rhonchi bilaterally Cardio: regular rate and rhythm, S1, S2 normal, no murmur, click, rub or gallop GI: soft, non-tender; bowel sounds normal; no masses,  no organomegaly Extremities: extremities normal, atraumatic, no cyanosis or edema  Lab Results:  Results for orders placed or performed during the hospital encounter of 08/09/14 (from the past 48 hour(s))  Glucose, capillary     Status: Abnormal   Collection Time: 08/10/14 11:14 AM  Result Value Ref Range   Glucose-Capillary 137 (H) 70 - 99 mg/dL  Glucose, capillary     Status: Abnormal   Collection Time: 08/10/14  4:23 PM  Result Value Ref Range   Glucose-Capillary 195 (H) 70 - 99 mg/dL  Glucose, capillary     Status: Abnormal   Collection Time: 08/10/14  8:04 PM  Result Value Ref Range   Glucose-Capillary 122 (H) 70 - 99 mg/dL   Comment 1 Notify RN   Glucose, capillary     Status: Abnormal   Collection Time: 08/11/14 12:01 AM  Result Value Ref Range   Glucose-Capillary 113 (H) 70 - 99 mg/dL   Comment 1 Notify RN   Glucose, capillary     Status: Abnormal   Collection Time: 08/11/14  4:03 AM  Result Value Ref Range   Glucose-Capillary 124 (H) 70 - 99 mg/dL   Comment 1 Notify RN   CBC WITH DIFFERENTIAL     Status: Abnormal   Collection Time: 08/11/14  5:27 AM  Result Value Ref Range   WBC 12.8 (H) 4.0 - 10.5 K/uL   RBC 2.56 (L) 4.22 - 5.81 MIL/uL    Hemoglobin 9.2 (L) 13.0 - 17.0 g/dL   HCT 28.3 (L) 39.0 - 52.0 %   MCV 110.5 (H) 78.0 - 100.0 fL   MCH 35.9 (H) 26.0 - 34.0 pg   MCHC 32.5 30.0 - 36.0 g/dL   RDW 15.0 11.5 - 15.5 %   Platelets 274 150 - 400 K/uL   Neutrophils Relative % 51 43 - 77 %   Neutro Abs 6.6 1.7 - 7.7 K/uL   Lymphocytes Relative 37 12 - 46 %   Lymphs Abs 4.8 (H) 0.7 - 4.0 K/uL   Monocytes Relative 7 3 - 12 %   Monocytes Absolute 0.9 0.1 - 1.0 K/uL   Eosinophils Relative 5 0 - 5 %   Eosinophils Absolute 0.6 0.0 - 0.7 K/uL   Basophils Relative 0 0 - 1 %   Basophils Absolute 0.0 0.0 - 0.1 K/uL  Comprehensive metabolic panel     Status: Abnormal   Collection Time: 08/11/14  5:27 AM  Result Value Ref Range   Sodium 134 (L) 135 - 145 mmol/L   Potassium 4.6 3.5 - 5.1 mmol/L   Chloride 105 96 - 112 mmol/L   CO2 22 19 - 32 mmol/L   Glucose, Bld 113 (H) 70 - 99 mg/dL   BUN 22 6 - 23 mg/dL  Creatinine, Ser 1.49 (H) 0.50 - 1.35 mg/dL   Calcium 8.4 8.4 - 10.5 mg/dL   Total Protein 6.7 6.0 - 8.3 g/dL   Albumin 2.1 (L) 3.5 - 5.2 g/dL   AST 23 0 - 37 U/L   ALT 23 0 - 53 U/L   Alkaline Phosphatase 91 39 - 117 U/L   Total Bilirubin 0.4 0.3 - 1.2 mg/dL   GFR calc non Af Amer 44 (L) >90 mL/min   GFR calc Af Amer 50 (L) >90 mL/min    Comment: (NOTE) The eGFR has been calculated using the CKD EPI equation. This calculation has not been validated in all clinical situations. eGFR's persistently <90 mL/min signify possible Chronic Kidney Disease.    Anion gap 7 5 - 15  Glucose, capillary     Status: Abnormal   Collection Time: 08/11/14  7:40 AM  Result Value Ref Range   Glucose-Capillary 111 (H) 70 - 99 mg/dL  Glucose, capillary     Status: Abnormal   Collection Time: 08/11/14 11:49 AM  Result Value Ref Range   Glucose-Capillary 147 (H) 70 - 99 mg/dL  Glucose, capillary     Status: Abnormal   Collection Time: 08/11/14  4:29 PM  Result Value Ref Range   Glucose-Capillary 169 (H) 70 - 99 mg/dL  Glucose, capillary      Status: Abnormal   Collection Time: 08/11/14  8:28 PM  Result Value Ref Range   Glucose-Capillary 125 (H) 70 - 99 mg/dL   Comment 1 Notify RN    Comment 2 Document in Chart   Glucose, capillary     Status: Abnormal   Collection Time: 08/12/14 12:26 AM  Result Value Ref Range   Glucose-Capillary 101 (H) 70 - 99 mg/dL   Comment 1 Notify RN    Comment 2 Document in Chart   Glucose, capillary     Status: None   Collection Time: 08/12/14  4:36 AM  Result Value Ref Range   Glucose-Capillary 91 70 - 99 mg/dL   Comment 1 Notify RN    Comment 2 Document in Chart   CBC WITH DIFFERENTIAL     Status: Abnormal   Collection Time: 08/12/14  5:24 AM  Result Value Ref Range   WBC 12.3 (H) 4.0 - 10.5 K/uL   RBC 2.54 (L) 4.22 - 5.81 MIL/uL   Hemoglobin 8.9 (L) 13.0 - 17.0 g/dL   HCT 28.0 (L) 39.0 - 52.0 %   MCV 110.2 (H) 78.0 - 100.0 fL   MCH 35.0 (H) 26.0 - 34.0 pg   MCHC 31.8 30.0 - 36.0 g/dL   RDW 14.9 11.5 - 15.5 %   Platelets 297 150 - 400 K/uL   Neutrophils Relative % 48 43 - 77 %   Neutro Abs 6.0 1.7 - 7.7 K/uL   Lymphocytes Relative 37 12 - 46 %   Lymphs Abs 4.6 (H) 0.7 - 4.0 K/uL   Monocytes Relative 7 3 - 12 %   Monocytes Absolute 0.9 0.1 - 1.0 K/uL   Eosinophils Relative 7 (H) 0 - 5 %   Eosinophils Absolute 0.9 (H) 0.0 - 0.7 K/uL   Basophils Relative 0 0 - 1 %   Basophils Absolute 0.0 0.0 - 0.1 K/uL  Comprehensive metabolic panel     Status: Abnormal   Collection Time: 08/12/14  5:24 AM  Result Value Ref Range   Sodium 135 135 - 145 mmol/L   Potassium 5.0 3.5 - 5.1 mmol/L   Chloride 105 96 -  112 mmol/L   CO2 23 19 - 32 mmol/L   Glucose, Bld 96 70 - 99 mg/dL   BUN 19 6 - 23 mg/dL   Creatinine, Ser 1.45 (H) 0.50 - 1.35 mg/dL   Calcium 8.5 8.4 - 10.5 mg/dL   Total Protein 6.7 6.0 - 8.3 g/dL   Albumin 2.1 (L) 3.5 - 5.2 g/dL   AST 27 0 - 37 U/L   ALT 25 0 - 53 U/L   Alkaline Phosphatase 86 39 - 117 U/L   Total Bilirubin 0.6 0.3 - 1.2 mg/dL   GFR calc non Af Amer 45 (L)  >90 mL/min   GFR calc Af Amer 52 (L) >90 mL/min    Comment: (NOTE) The eGFR has been calculated using the CKD EPI equation. This calculation has not been validated in all clinical situations. eGFR's persistently <90 mL/min signify possible Chronic Kidney Disease.    Anion gap 7 5 - 15  Glucose, capillary     Status: None   Collection Time: 08/12/14  7:10 AM  Result Value Ref Range   Glucose-Capillary 97 70 - 99 mg/dL   Comment 1 Notify RN    Comment 2 Document in Chart     ABGS No results for input(s): PHART, PO2ART, TCO2, HCO3 in the last 72 hours.  Invalid input(s): PCO2 CULTURES Recent Results (from the past 240 hour(s))  Urine culture     Status: None   Collection Time: 08/09/14  9:28 PM  Result Value Ref Range Status   Specimen Description URINE, CLEAN CATCH  Final   Special Requests NONE  Final   Colony Count NO GROWTH Performed at [facility]   Final   Culture NO GROWTH Performed at [facility]   Final   Report Status 08/11/2014 FINAL  Final  Blood culture (routine x 2)     Status: None (Preliminary result)   Collection Time: 08/10/14 12:38 AM  Result Value Ref Range Status   Specimen Description BLOOD RIGHT ANTECUBITAL  Final   Special Requests   Final    BOTTLES DRAWN AEROBIC AND ANAEROBIC AEB 10CC ANA [place]   Culture PENDING  Incomplete   Report Status PENDING  Incomplete  Blood culture (routine x 2)     Status: None (Preliminary result)   Collection Time: 08/10/14 12:43 AM  Result Value Ref Range Status   Specimen Description BLOOD LEFT ANTECUBITAL  Final   Special Requests   Final    BOTTLES DRAWN AEROBIC AND ANAEROBIC AEB 10CC ANA 8CC   Culture PENDING  Incomplete   Report Status PENDING  Incomplete   Studies/Results: No results found.  Medications:  Prior to Admission:  Prescriptions prior to admission  Medication Sig Dispense Refill Last Dose  . acetaminophen (TYLENOL) 325 MG tablet Take 650 mg by mouth every 6 (six) hours as  needed. Pain.   08/08/2014 at Unknown time  . acyclovir (ZOVIRAX) 400 MG tablet Take 1 tablet (400 mg total) by mouth 2 (two) times daily. 60 tablet 6 08/09/2014 at Unknown time  . amoxicillin-clavulanate (AUGMENTIN) 875-125 MG per tablet Take 1 tablet by mouth 2 (two) times daily. 14 tablet 0 08/09/2014 at Unknown time  . bacitracin ointment Apply 1 application topically 2 (two) times daily. Fingers and feet   Past Week at Unknown time  . benzonatate (TESSALON) 200 MG capsule Take 1 capsule (200 mg total) by mouth 3 (three) times daily as needed for cough. 30 capsule 1 08/09/2014 at Unknown time  .  Ca Carbonate-Mag Hydroxide (ROLAIDS PO) Take 1 tablet by mouth daily as needed (for heartburn).    Past Week at Unknown time  . Docusate Calcium (STOOL SOFTENER PO) Take 1 capsule by mouth daily as needed (constipation).    Past Month at Unknown time  . ibrutinib (IMBRUVICA) 140 MG capsul Take 2 capsules (280 mg total) by mouth daily. 60 capsule 0 08/09/2014 at 2000  . metFORMIN (GLUCOPHAGE) 500 MG tablet Take 500-1,000 mg by mouth 2 (two) times daily with a meal. 2 tablets in the morning and 1 tablet in the evening.   08/09/2014 at Unknown time  . Phenyleph-Doxylamine-DM-APAP (ALKA SELTZER PLUS PO) Take 2 tablets by mouth daily as needed (for stomach upset).    08/09/2014 at Unknown time  . ranitidine (ZANTAC) 150 MG tablet Take 150 mg by mouth 3 (three) times daily with meals.   08/09/2014 at Unknown time  . rosuvastatin (CRESTOR) 20 MG tablet Take 20 mg by mouth daily.    08/09/2014 at Unknown time  . triamterene-hydrochlorothiazide (DYAZIDE) 37.5-25 MG per capsule Take 1 capsule by mouth daily.   08/09/2014 at Unknown time  . zolpidem (AMBIEN) 5 MG tablet Take 5 mg by mouth at bedtime as needed for sleep.   Taking  . HYDROcodone-acetaminophen (NORCO) 5-325 MG per tablet Take 1 tablet by mouth every 6 (six) hours as needed for moderate pain. (Patient not taking: Reported on 05/28/2014) 30 tablet 0 Not Taking    Scheduled: . acyclovir  400 mg Oral BID  . albuterol  2.5 mg Nebulization TID  . ceFEPime (MAXIPIME) IV  1 g Intravenous Q24H  . feeding supplement (ENSURE ENLIVE)  237 mL Oral BID BM  . guaiFENesin  1,200 mg Oral BID  . heparin  5,000 Units Subcutaneous 3 times per day  . ibrutinib  280 mg Oral Daily  . insulin aspart  0-9 Units Subcutaneous 6 times per day  . rosuvastatin  20 mg Oral Daily  . vancomycin  500 mg Intravenous Q24H   Continuous:  [id number], benzonatate, zolpidem  Assesment: He was admitted with healthcare associated pneumonia and that is complicated by chronic lymphocytic leukemia on therapy. He is improving. Active Problems:   HCAP (healthcare-associated pneumonia)   AKI (acute kidney injury)   Hyperkalemia   Malnutrition of moderate degree    Plan: Continue current treatments.    LOS: 2 days   Isaid Salvia L 08/12/2014, 8:22 AM

## 2014-08-12 NOTE — Progress Notes (Signed)
Subjective: The patient is alert and oriented and in fairly good night. He does have a history of chronic lymphocytic leukemia which is being treated and was admitted with bronchopneumonia which is improving.  Objective: Vital signs in last 24 hours: Temp:  [97.7 F (36.5 C)-98.5 F (36.9 C)] 98.5 F (36.9 C) (03/31 0532) Pulse Rate:  [101-104] 104 (03/31 0532) Resp:  [18-20] 20 (03/31 0532) BP: (110-131)/(57-75) 131/69 mmHg (03/31 0532) SpO2:  [97 %-100 %] 100 % (03/31 0532) Weight change:  Last BM Date: 08/11/14  Intake/Output from previous day: 03/30 0701 - 03/31 0700 In: 720 [P.O.:720] Out: 350 [Urine:350] Intake/Output this shift:    Physical Exam: General appearance patient is alert and oriented  HEENT negative  Neck supple no JVD or thyroid abnormalities  Lungs clear to P&A  Heart regular rhythm no murmurs  Abdomen the palpable organs or masses  Extremities free of edema   Recent Labs  08/10/14 0345 08/11/14 0527  WBC 13.8* 12.8*  HGB 9.4* 9.2*  HCT 29.4* 28.3*  PLT 218 274   BMET  Recent Labs  08/10/14 0345 08/11/14 0527  NA 132* 134*  K 4.7 4.6  CL 105 105  CO2 22 22  GLUCOSE 152* 113*  BUN 26* 22  CREATININE 2.02* 1.49*  CALCIUM 8.0* 8.4    Studies/Results: US Renal  08/10/2014   CLINICAL DATA:  79 year old male with acute renal injury, currently on chemotherapy for leukemia. Initial encounter.  EXAM: RENAL/URINARY TRACT ULTRASOUND COMPLETE  COMPARISON:  CT Abdomen and Pelvis 07/28/2012.  FINDINGS: Right Kidney:  Length: 8.6 cm. Mildly increased cortical echogenicity (image 2). No hydronephrosis or renal mass.  Left Kidney:  Length: 8.4 cm. Cortical echogenicity probably mildly increased similar to that on the right. No hydronephrosis or renal mass.  Bladder:  Appears normal for degree of bladder distention.  IMPRESSION: Increased renal cortical echogenicity suggesting a degree of chronic medical renal disease. No hydronephrosis or acute  sonographic finding.   Electronically Signed   By: Genevie Ann M.D.   On: 08/10/2014 08:21    Medications:  . acyclovir  400 mg Oral BID  . albuterol  2.5 mg Nebulization TID  . ceFEPime (MAXIPIME) IV  1 g Intravenous Q24H  . feeding supplement (ENSURE ENLIVE)  237 mL Oral BID BM  . guaiFENesin  1,200 mg Oral BID  . heparin  5,000 Units Subcutaneous 3 times per day  . ibrutinib  280 mg Oral Daily  . insulin aspart  0-9 Units Subcutaneous 6 times per day  . rosuvastatin  20 mg Oral Daily  . vancomycin  500 mg Intravenous Q24H        Assessment/Plan: 1. Healthcare associated pneumonia-plan to continue IV vancomycin and Maxipime  2. Chronic lymphocytic leukemia  3. AK I with elevated serum potassium-plan to continue current treatments Baptiste Littler G 08/12/2014, 6:24 AM

## 2014-08-14 LAB — VITAMIN D 1,25 DIHYDROXY
Vitamin D 1, 25 (OH)2 Total: <10 pg/mL
Vitamin D2 1, 25 (OH)2: <10 pg/mL

## 2014-08-15 LAB — CULTURE, BLOOD (ROUTINE X 2)
CULTURE: NO GROWTH
CULTURE: NO GROWTH

## 2014-08-19 ENCOUNTER — Encounter (HOSPITAL_COMMUNITY): Payer: Medicare Other

## 2014-08-19 ENCOUNTER — Encounter (HOSPITAL_COMMUNITY): Payer: Self-pay | Admitting: Hematology & Oncology

## 2014-08-19 ENCOUNTER — Encounter (HOSPITAL_COMMUNITY): Payer: Medicare Other | Attending: Hematology & Oncology

## 2014-08-19 ENCOUNTER — Emergency Department (HOSPITAL_COMMUNITY)
Admission: EM | Admit: 2014-08-19 | Discharge: 2014-08-19 | Disposition: A | Discharge status: Home / Self Care | Payer: Medicare Other | Attending: Emergency Medicine | Admitting: Emergency Medicine

## 2014-08-19 ENCOUNTER — Encounter (HOSPITAL_COMMUNITY): Payer: Self-pay | Admitting: Emergency Medicine

## 2014-08-19 ENCOUNTER — Encounter (HOSPITAL_COMMUNITY): Payer: Medicare Other | Attending: Oncology | Admitting: Hematology & Oncology

## 2014-08-19 ENCOUNTER — Emergency Department (HOSPITAL_COMMUNITY): Payer: Medicare Other

## 2014-08-19 ENCOUNTER — Other Ambulatory Visit: Payer: Self-pay

## 2014-08-19 VITALS — BP 108/69 | HR 119 | Resp 22 | Wt 121.8 lb

## 2014-08-19 DIAGNOSIS — Z8701 Personal history of pneumonia (recurrent): Secondary | ICD-10-CM | POA: Diagnosis not present

## 2014-08-19 DIAGNOSIS — Z856 Personal history of leukemia: Secondary | ICD-10-CM | POA: Insufficient documentation

## 2014-08-19 DIAGNOSIS — Z8619 Personal history of other infectious and parasitic diseases: Secondary | ICD-10-CM | POA: Diagnosis not present

## 2014-08-19 DIAGNOSIS — E860 Dehydration: Secondary | ICD-10-CM | POA: Insufficient documentation

## 2014-08-19 DIAGNOSIS — I10 Essential (primary) hypertension: Secondary | ICD-10-CM | POA: Insufficient documentation

## 2014-08-19 DIAGNOSIS — E119 Type 2 diabetes mellitus without complications: Secondary | ICD-10-CM | POA: Diagnosis not present

## 2014-08-19 DIAGNOSIS — C9110 Chronic lymphocytic leukemia of B-cell type not having achieved remission: Secondary | ICD-10-CM

## 2014-08-19 DIAGNOSIS — R1110 Vomiting, unspecified: Secondary | ICD-10-CM | POA: Diagnosis present

## 2014-08-19 DIAGNOSIS — Z862 Personal history of diseases of the blood and blood-forming organs and certain disorders involving the immune mechanism: Secondary | ICD-10-CM | POA: Insufficient documentation

## 2014-08-19 DIAGNOSIS — M310 Hypersensitivity angiitis: Secondary | ICD-10-CM | POA: Insufficient documentation

## 2014-08-19 DIAGNOSIS — Z85820 Personal history of malignant melanoma of skin: Secondary | ICD-10-CM | POA: Insufficient documentation

## 2014-08-19 DIAGNOSIS — R112 Nausea with vomiting, unspecified: Secondary | ICD-10-CM | POA: Diagnosis not present

## 2014-08-19 DIAGNOSIS — Z87891 Personal history of nicotine dependence: Secondary | ICD-10-CM | POA: Insufficient documentation

## 2014-08-19 DIAGNOSIS — Z79899 Other long term (current) drug therapy: Secondary | ICD-10-CM | POA: Diagnosis not present

## 2014-08-19 DIAGNOSIS — Z872 Personal history of diseases of the skin and subcutaneous tissue: Secondary | ICD-10-CM | POA: Insufficient documentation

## 2014-08-19 DIAGNOSIS — N289 Disorder of kidney and ureter, unspecified: Secondary | ICD-10-CM | POA: Diagnosis not present

## 2014-08-19 LAB — BASIC METABOLIC PANEL
Anion gap: 7 (ref 5–15)
BUN: 29 mg/dL — AB (ref 6–23)
CALCIUM: 8.6 mg/dL (ref 8.4–10.5)
CO2: 20 mmol/L (ref 19–32)
CREATININE: 2.23 mg/dL — AB (ref 0.50–1.35)
Chloride: 104 mmol/L (ref 96–112)
GFR calc Af Amer: 31 mL/min — ABNORMAL LOW (ref >90)
GFR, EST NON AFRICAN AMERICAN: 27 mL/min — AB (ref >90)
GLUCOSE: 100 mg/dL — AB (ref 70–99)
Potassium: 5.3 mmol/L — ABNORMAL HIGH (ref 3.5–5.1)
Sodium: 131 mmol/L — ABNORMAL LOW (ref 135–145)

## 2014-08-19 LAB — URINALYSIS, ROUTINE W REFLEX MICROSCOPIC
BILIRUBIN URINE: NEGATIVE
Glucose, UA: NEGATIVE mg/dL
HGB URINE DIPSTICK: NEGATIVE
Ketones, ur: NEGATIVE mg/dL
Leukocytes, UA: NEGATIVE
Nitrite: NEGATIVE
PH: 5.5 (ref 5.0–8.0)
Protein, ur: NEGATIVE mg/dL
SPECIFIC GRAVITY, URINE: 1.025 (ref 1.005–1.030)
UROBILINOGEN UA: 0.2 mg/dL (ref 0.0–1.0)

## 2014-08-19 LAB — COMPREHENSIVE METABOLIC PANEL
ALBUMIN: 2.6 g/dL — AB (ref 3.5–5.2)
ALK PHOS: 124 U/L — AB (ref 39–117)
ALT: 31 U/L (ref 0–53)
AST: 49 U/L — AB (ref 0–37)
Anion gap: 11 (ref 5–15)
BUN: 31 mg/dL — ABNORMAL HIGH (ref 6–23)
CO2: 17 mmol/L — ABNORMAL LOW (ref 19–32)
Calcium: 9 mg/dL (ref 8.4–10.5)
Chloride: 101 mmol/L (ref 96–112)
Creatinine, Ser: 2.37 mg/dL — ABNORMAL HIGH (ref 0.50–1.35)
GFR calc Af Amer: 29 mL/min — ABNORMAL LOW (ref >90)
GFR calc non Af Amer: 25 mL/min — ABNORMAL LOW (ref >90)
Glucose, Bld: 89 mg/dL (ref 70–99)
POTASSIUM: 4.8 mmol/L (ref 3.5–5.1)
Sodium: 129 mmol/L — ABNORMAL LOW (ref 135–145)
Total Bilirubin: 0.4 mg/dL (ref 0.3–1.2)
Total Protein: 7.6 g/dL (ref 6.0–8.3)

## 2014-08-19 LAB — CBC WITH DIFFERENTIAL/PLATELET
BASOS ABS: 0.1 10*3/uL (ref 0.0–0.1)
BASOS ABS: 0.1 10*3/uL (ref 0.0–0.1)
BASOS PCT: 0 % (ref 0–1)
Basophils Relative: 0 % (ref 0–1)
EOS ABS: 0.8 10*3/uL — AB (ref 0.0–0.7)
EOS PCT: 1 % (ref 0–5)
Eosinophils Absolute: 0.2 10*3/uL (ref 0.0–0.7)
Eosinophils Relative: 4 % (ref 0–5)
HCT: 27.4 % — ABNORMAL LOW (ref 39.0–52.0)
HEMATOCRIT: 30.3 % — AB (ref 39.0–52.0)
HEMOGLOBIN: 9.7 g/dL — AB (ref 13.0–17.0)
Hemoglobin: 8.8 g/dL — ABNORMAL LOW (ref 13.0–17.0)
LYMPHS ABS: 4.9 10*3/uL — AB (ref 0.7–4.0)
Lymphocytes Relative: 19 % (ref 12–46)
Lymphocytes Relative: 28 % (ref 12–46)
Lymphs Abs: 4.4 10*3/uL — ABNORMAL HIGH (ref 0.7–4.0)
MCH: 35.3 pg — AB (ref 26.0–34.0)
MCH: 35.3 pg — ABNORMAL HIGH (ref 26.0–34.0)
MCHC: 32 g/dL (ref 30.0–36.0)
MCHC: 32.1 g/dL (ref 30.0–36.0)
MCV: 110.2 fL — ABNORMAL HIGH (ref 78.0–100.0)
MCV: 110 fL — AB (ref 78.0–100.0)
MONOS PCT: 6 % (ref 3–12)
Monocytes Absolute: 1.5 10*3/uL — ABNORMAL HIGH (ref 0.1–1.0)
Monocytes Absolute: 0.9 10*3/uL (ref 0.1–1.0)
Monocytes Relative: 5 % (ref 3–12)
NEUTROS ABS: 16.1 10*3/uL — AB (ref 1.7–7.7)
NEUTROS ABS: 11.6 10*3/uL — AB (ref 1.7–7.7)
Neutrophils Relative %: 70 % (ref 43–77)
Neutrophils Relative %: 66 % (ref 43–77)
Platelets: 395 10*3/uL (ref 150–400)
Platelets: 347 10*3/uL (ref 150–400)
RBC: 2.75 MIL/uL — AB (ref 4.22–5.81)
RBC: 2.49 MIL/uL — AB (ref 4.22–5.81)
RDW: 15.6 % — ABNORMAL HIGH (ref 11.5–15.5)
RDW: 15.5 % (ref 11.5–15.5)
WBC: 22.9 10*3/uL — ABNORMAL HIGH (ref 4.0–10.5)
WBC: 17.6 10*3/uL — ABNORMAL HIGH (ref 4.0–10.5)

## 2014-08-19 LAB — LIPASE, BLOOD: LIPASE: 46 U/L (ref 11–59)

## 2014-08-19 MED ORDER — HEPARIN SOD (PORK) LOCK FLUSH 100 UNIT/ML IV SOLN
500.0000 [IU] | Freq: Once | INTRAVENOUS | Status: DC
  Filled 2014-08-19: qty 5

## 2014-08-19 MED ORDER — SODIUM CHLORIDE 0.9 % IJ SOLN: 10.0000 mL | INTRAMUSCULAR | Status: DC | PRN

## 2014-08-19 MED ORDER — ONDANSETRON HCL 4 MG/2ML IJ SOLN
4.0000 mg | Freq: Once | INTRAMUSCULAR | Status: AC
  Administered 2014-08-19: 4 mg via INTRAVENOUS

## 2014-08-19 MED ORDER — HEPARIN SOD (PORK) LOCK FLUSH 100 UNIT/ML IV SOLN
INTRAVENOUS | Status: AC
  Filled 2014-08-19: qty 5

## 2014-08-19 MED ORDER — SODIUM CHLORIDE 0.9 % IV SOLN
1000.0000 mL | Freq: Once | INTRAVENOUS | Status: AC
  Administered 2014-08-19: 1000 mL via INTRAVENOUS

## 2014-08-19 MED ORDER — ONDANSETRON HCL 8 MG PO TABS: 8.0000 mg | ORAL_TABLET | Freq: Three times a day (TID) | ORAL | Status: DC | PRN

## 2014-08-19 MED ORDER — SODIUM CHLORIDE 0.9 % IV SOLN
Freq: Once | INTRAVENOUS | Status: AC
  Administered 2014-08-19: 18:00:00 via INTRAVENOUS

## 2014-08-19 MED ORDER — ONDANSETRON HCL 4 MG/2ML IJ SOLN
4.0000 mg | Freq: Once | INTRAMUSCULAR | Status: DC
  Filled 2014-08-19: qty 2

## 2014-08-19 NOTE — ED Notes (Addendum)
PT sent from Cancer center d/t nausea/vomiting x2 days. PT was released from hospital the past Thursday d/t pneumonia. PT c/o abdominal pain and tenderness.

## 2014-08-19 NOTE — Progress Notes (Signed)
See MD exam notes 

## 2014-08-19 NOTE — ED Provider Notes (Signed)
CSN: [id number]     Arrival date & time 08/19/14  1125 History   First MD Initiated Contact with Patient 08/19/14 1304     Chief Complaint  Patient presents with  . Emesis     (Consider location/radiation/quality/duration/timing/severity/associated sxs/prior Treatment) The history is provided by the patient and the spouse.     Randall Reyes is a 79 y.o. male presents for evaluation of vomiting, for 2 days. There is no associated fever, chills, diarrhea, shortness of breath or chest pain. He was seeing his oncologist today for a routine checkup, when she decided to send him here for evaluation. He does not have any known sick contacts, or known abnormal food ingestions. He does not have a chronic vomiting disorder. He lives with his wife, who is here with him, and he'll stated history. He was recently hospitalized, with AKI and possible pneumonia. He was not discharged on antibiotics. There are no other known modifying factors.   Past Medical History  Diagnosis Date  . Diabetes mellitus   . Leukemia 6.22.2012    PER PATIENT  . Hypertension   . Ulcer   . Anemia   . CLL (chronic lymphocytic leukemia) 01/24/2011  . Melanoma in situ 01/24/2011  . DM (diabetes mellitus) 01/24/2011  . Mild obesity 01/24/2011  . Eczema 01/24/2011  . Port catheter in place 05/02/2012  . Shingles   . History of pneumonia 04/2014   Past Surgical History  Procedure Laterality Date  . Hernia repair  2001  . Bleeding ulcer    . Portacath placement  2008  . Cataract extraction w/phaco  05/05/2012    Procedure: CATARACT EXTRACTION PHACO AND INTRAOCULAR LENS PLACEMENT (IOC);  Surgeon: Randall Branch, MD;  Location: [facility];  Service: Ophthalmology;  Laterality: Right;  CDE:13.25  . Cataract extraction w/phaco  05/15/2012    Procedure: CATARACT EXTRACTION PHACO AND INTRAOCULAR LENS PLACEMENT (IOC);  Surgeon: Randall Branch, MD;  Location: [facility];  Service: Ophthalmology;  Laterality: Left;  CDE:  12.32   Family History   Problem Relation Age of Onset  . Diabetes Father    History  Substance Use Topics  . Smoking status: Former Research scientist (life sciences)  . Smokeless tobacco: Former Systems developer    Quit date: 04/25/1967  . Alcohol Use: No    Review of Systems  All other systems reviewed and are negative.     Allergies  Aspirin and Ibuprofen  Home Medications   Prior to Admission medications   Medication Sig Start Date End Date Taking? Authorizing Provider  acyclovir (ZOVIRAX) 400 MG tablet Take 1 tablet (400 mg total) by mouth 2 (two) times daily. 03/29/14  Yes Randall Cancer, PA-C  Ca Carbonate-Mag Hydroxide (ROLAIDS PO) Take 1 tablet by mouth daily as needed (for heartburn).    Yes Historical Provider, MD  ibrutinib (IMBRUVICA) 140 MG capsul Take 2 capsules (280 mg total) by mouth daily. 08/05/14  Yes Randall Cancer, PA-C  metFORMIN (GLUCOPHAGE) 500 MG tablet Take 500-1,000 mg by mouth 2 (two) times daily with a meal. 2 tablets in the morning and 1 tablet in the evening.   Yes Historical Provider, MD  Phenyleph-Doxylamine-DM-APAP (ALKA SELTZER PLUS PO) Take 2 tablets by mouth daily as needed (for stomach upset).    Yes Historical Provider, MD  ranitidine (ZANTAC) 150 MG tablet Take 150 mg by mouth 3 (three) times daily with meals.   Yes Historical Provider, MD  rosuvastatin (CRESTOR) 20 MG tablet Take 20 mg by mouth daily.  Yes Historical Provider, MD  triamterene-hydrochlorothiazide (DYAZIDE) 37.5-25 MG per capsule Take 1 capsule by mouth daily.   Yes Historical Provider, MD  zolpidem (AMBIEN) 5 MG tablet Take 5 mg by mouth at bedtime as needed for sleep.   Yes Historical Provider, MD  benzonatate (TESSALON) 200 MG capsule Take 1 capsule (200 mg total) by mouth 3 (three) times daily as needed for cough. Patient not taking: Reported on 08/19/2014 08/05/14   Randall Cancer, PA-C  HYDROcodone-acetaminophen (NORCO) 5-325 MG per tablet Take 1 tablet by mouth every 6 (six) hours as needed for moderate pain. Patient not  taking: Reported on 05/28/2014 01/04/14   Manon Hilding Kefalas, PA-C   BP 100/69 mmHg  Pulse 108  Temp(Src) 98.7 F (37.1 C) (Oral)  Resp 22  Ht 5\' 2"  (1.575 m)  Wt 121 lb (54.885 kg)  BMI 22.13 kg/m2  SpO2 100% Physical Exam  Constitutional: He is oriented to person, place, and time. He appears well-developed.  Elderly, frail  HENT:  Head: Normocephalic and atraumatic.  Right Ear: External ear normal.  Left Ear: External ear normal.  Eyes: Conjunctivae and EOM are normal. Pupils are equal, round, and reactive to light.  Neck: Normal range of motion and phonation normal. Neck supple.  Cardiovascular: Normal rate, regular rhythm and normal heart sounds.   Pulmonary/Chest: Effort normal and breath sounds normal. He exhibits no bony tenderness.  Abdominal: Soft. He exhibits no distension. There is no tenderness. There is no rebound and no guarding.  Musculoskeletal: Normal range of motion.  Neurological: He is alert and oriented to person, place, and time. No cranial nerve deficit or sensory deficit. He exhibits normal muscle tone. Coordination normal.  Skin: Skin is warm, dry and intact.  Psychiatric: He has a normal mood and affect. His behavior is normal. Judgment and thought content normal.  Nursing note and vitals reviewed.   ED Course  Procedures (including critical care time)  Medications  0.9 %  sodium chloride infusion (0 mLs Intravenous Stopped 08/19/14 1348)  ondansetron (ZOFRAN) injection 4 mg (4 mg Intravenous Given 08/19/14 1234)    Patient Vitals for the past 24 hrs:  BP Temp Temp src Pulse Resp SpO2 Height Weight  08/19/14 1400 100/69 mmHg - - 108 22 100 % - -  08/19/14 1348 109/71 mmHg - - 110 18 98 % - -  08/19/14 1230 106/63 mmHg - - 112 18 98 % - -  08/19/14 1200 104/67 mmHg - - 109 17 96 % - -  08/19/14 1136 96/65 mmHg 98.7 F (37.1 C) Oral 113 18 97 % 5\' 2"  (1.575 m) 121 lb (54.885 kg)    5:26 PM Reevaluation with update and discussion. After initial  assessment and treatment, an updated evaluation reveals patient is now tolerating oral liquids. He has significant elevation of creatinine, compared to recently. Randall Reyes   Labs Review Labs Reviewed  CBC WITH DIFFERENTIAL/PLATELET - Abnormal; Notable for the following:    WBC 22.9 (*)    RBC 2.75 (*)    Hemoglobin 9.7 (*)    HCT 30.3 (*)    MCV 110.2 (*)    MCH 35.3 (*)    RDW 15.6 (*)    Neutro Abs 16.1 (*)    Lymphs Abs 4.4 (*)    Monocytes Absolute 1.5 (*)    Eosinophils Absolute 0.8 (*)    All other components within normal limits  COMPREHENSIVE METABOLIC PANEL - Abnormal; Notable for the following:    Sodium 129 (*)  CO2 17 (*)    BUN 31 (*)    Creatinine, Ser 2.37 (*)    Albumin 2.6 (*)    AST 49 (*)    Alkaline Phosphatase 124 (*)    GFR calc non Af Amer 25 (*)    GFR calc Af Amer 29 (*)    All other components within normal limits  URINE CULTURE  LIPASE, BLOOD  URINALYSIS, ROUTINE W REFLEX MICROSCOPIC   BUN  Date Value Ref Range Status  08/19/2014 31* 6 - 23 mg/dL Final  08/12/2014 19 6 - 23 mg/dL Final  08/11/2014 22 6 - 23 mg/dL Final  08/10/2014 26* 6 - 23 mg/dL Final   CREATININE, SER  Date Value Ref Range Status  08/19/2014 2.37* 0.50 - 1.35 mg/dL Final  08/12/2014 1.45* 0.50 - 1.35 mg/dL Final  08/11/2014 1.49* 0.50 - 1.35 mg/dL Final  08/10/2014 2.02* 0.50 - 1.35 mg/dL Final    WBC  Date Value Ref Range Status  08/19/2014 22.9* 4.0 - 10.5 K/uL Final  08/12/2014 12.3* 4.0 - 10.5 K/uL Final  08/11/2014 12.8* 4.0 - 10.5 K/uL Final  08/10/2014 13.8* 4.0 - 10.5 K/uL Final    Comment:    RESULT REPEATED AND VERIFIED   Component     Latest Ref Rng 08/12/2014 08/19/2014  Sodium     135 - 145 mmol/Reyes 135 129 (Reyes)  Potassium     3.5 - 5.1 mmol/Reyes 5.0 4.8  Chloride     96 - 112 mmol/Reyes 105 101  CO2     19 - 32 mmol/Reyes 23 17 (Reyes)  Glucose     70 - 99 mg/dL 96 89  BUN     6 - 23 mg/dL 19 31 (H)  Creatinine     0.50 - 1.35 mg/dL 1.45 (H) 2.37  (H)  Calcium     8.4 - 10.5 mg/dL 8.5 9.0  Total Protein     6.0 - 8.3 g/dL 6.7 7.6  Albumin     3.5 - 5.2 g/dL 2.1 (Reyes) 2.6 (Reyes)  AST     0 - 37 U/Reyes 27 49 (H)  ALT     0 - 53 U/Reyes 25 31  Alkaline Phosphatase     39 - 117 U/Reyes 86 124 (H)  Total Bilirubin     0.3 - 1.2 mg/dL 0.6 0.4  GFR calc non Af Amer     >90 mL/min 45 (Reyes) 25 (Reyes)  GFR calc Af Amer     >90 mL/min 52 (Reyes) 29 (Reyes)  Anion gap     5 - 15 7 11      Imaging Review Dg Chest 2 View  08/19/2014   CLINICAL DATA:  Two days of nausea and vomiting ; history of leukemia and diabetes  EXAM: CHEST  2 VIEW  COMPARISON:  PA and lateral chest of August 09, 2014  FINDINGS: The lungs are hypoinflated. There is no focal infiltrate. There is no pleural effusion. The cardiac silhouette is mildly enlarged. The pulmonary vascularity is not engorged. The Port-A-Cath appliance tip projects over the midportion of the SVC. The mediastinum is normal in width. The bony thorax is unremarkable.  IMPRESSION: There is mild hypoinflation. There is no pneumonia, CHF, nor other acute cardiopulmonary disease.   Electronically Signed   By: David  Martinique   On: 08/19/2014 13:23     EKG Interpretation   Date/Time:  Thursday August 19 2014 11:44:38 EDT Ventricular Rate:  110 PR Interval:  179 QRS Duration:  119 QT Interval:  331 QTC Calculation: 448 R Axis:   0 Text Interpretation:  Sinus tachycardia Right bundle Reyes block Low  voltage, extremity and precordial leads Since last tracing rate faster  Confirmed by [facility]  MD, Christin Mccreedy ([id number]) on 08/19/2014 3:52:00 PM      MDM   Final diagnoses:  Dehydration  Renal insufficiency  Nausea and vomiting, vomiting of unspecified type    Nonspecific, vomiting, improved with treatment. Mild worsening renal sufficiency with baseline chronic renal disease. Patient nontoxic, improved and showing trend for improvement on labs with retesting. Tentative discharge. He is tolerating food, fluid and ambulating without  problems.  Nursing Notes Reviewed/ Care Coordinated, and agree without changes. Applicable Imaging Reviewed.  Interpretation of Laboratory Data incorporated into ED treatment  Nursing Notes Reviewed/ Care Coordinated Applicable Imaging Reviewed Interpretation of Laboratory Data incorporated into ED treatment  The patient appears reasonably screened and/or stabilized for discharge and I doubt any other medical condition or other [facility] requiring further screening, evaluation, or treatment in the ED at this time prior to discharge.  Plan: Home Medications- Zofran; Home Treatments- rest, push fluids; return here if the recommended treatment, does not improve the symptoms; Recommended follow up- PCP check 4 days    Daleen Bo, MD 08/19/14 2204

## 2014-08-19 NOTE — Patient Instructions (Signed)
[place] at [facility] Discharge Instructions  RECOMMENDATIONS MADE BY THE CONSULTANT AND ANY TEST RESULTS WILL BE SENT TO YOUR REFERRING PHYSICIAN.  Exam and discussion by Dr. [name].  You need to go to the ED for care.  Stop the imbruvica.  Follow-up in 3 weeks.  Thank you for choosing [place] at [facility] to provide your oncology and hematology care.  To afford each patient quality time with our provider, please arrive at least 15 minutes before your scheduled appointment time.    You need to re-schedule your appointment should you arrive 10 or more minutes late.  We strive to give you quality time with our providers, and arriving late affects you and other patients whose appointments are after yours.  Also, if you no show three or more times for appointments you may be dismissed from the clinic at the providers discretion.     Again, thank you for choosing [facility].  Our hope is that these requests will decrease the amount of time that you wait before being seen by our physicians.       _____________________________________________________________  Should you have questions after your visit to [facility], please contact our office at [phone] between the hours of 8:30 a.m. and 4:30 p.m.  Voicemails left after 4:30 p.m. will not be returned until the following business day.  For prescription refill requests, have your pharmacy contact our office.

## 2014-08-19 NOTE — Discharge Instructions (Signed)
Gradually advance her diet, initially by drinking plenty of fluids such as water. Be careful when you stand up so that you did not fall down. Follow-up with your primary care doctor next week for checkup. Return here, if needed, for problems.     Dehydration, Adult Dehydration is when you lose more fluids from the body than you take in. Vital organs like the kidneys, brain, and heart cannot function without a proper amount of fluids and salt. Any loss of fluids from the body can cause dehydration.  CAUSES   Vomiting.  Diarrhea.  Excessive sweating.  Excessive urine output.  Fever. SYMPTOMS  Mild dehydration  Thirst.  Dry lips.  Slightly dry mouth. Moderate dehydration  Very dry mouth.  Sunken eyes.  Skin does not bounce back quickly when lightly pinched and released.  Dark urine and decreased urine production.  Decreased tear production.  Headache. Severe dehydration  Very dry mouth.  Extreme thirst.  Rapid, weak pulse (more than 100 beats per minute at rest).  Cold hands and feet.  Not able to sweat in spite of heat and temperature.  Rapid breathing.  Blue lips.  Confusion and lethargy.  Difficulty being awakened.  Minimal urine production.  No tears. DIAGNOSIS  Your caregiver will diagnose dehydration based on your symptoms and your exam. Blood and urine tests will help confirm the diagnosis. The diagnostic evaluation should also identify the cause of dehydration. TREATMENT  Treatment of mild or moderate dehydration can often be done at home by increasing the amount of fluids that you drink. It is best to drink small amounts of fluid more often. Drinking too much at one time can make vomiting worse. Refer to the home care instructions below. Severe dehydration needs to be treated at the hospital where you will probably be given intravenous (IV) fluids that contain water and electrolytes. HOME CARE INSTRUCTIONS   Ask your caregiver about  specific rehydration instructions.  Drink enough fluids to keep your urine clear or pale yellow.  Drink small amounts frequently if you have nausea and vomiting.  Eat as you normally do.  Avoid:  Foods or drinks high in sugar.  Carbonated drinks.  Juice.  Extremely hot or cold fluids.  Drinks with caffeine.  Fatty, greasy foods.  Alcohol.  Tobacco.  Overeating.  Gelatin desserts.  Wash your hands well to avoid spreading bacteria and viruses.  Only take over-the-counter or prescription medicines for pain, discomfort, or fever as directed by your caregiver.  Ask your caregiver if you should continue all prescribed and over-the-counter medicines.  Keep all follow-up appointments with your caregiver. SEEK MEDICAL CARE IF:  You have abdominal pain and it increases or stays in one area (localizes).  You have a rash, stiff neck, or severe headache.  You are irritable, sleepy, or difficult to awaken.  You are weak, dizzy, or extremely thirsty. SEEK IMMEDIATE MEDICAL CARE IF:   You are unable to keep fluids down or you get worse despite treatment.  You have frequent episodes of vomiting or diarrhea.  You have blood or green matter (bile) in your vomit.  You have blood in your stool or your stool looks black and tarry.  You have not urinated in 6 to 8 hours, or you have only urinated a small amount of very dark urine.  You have a fever.  You faint. MAKE SURE YOU:   Understand these instructions.  Will watch your condition.  Will get help right away if you are not doing well  or get worse. Document Released: 04/30/2005 Document Revised: 07/23/2011 Document Reviewed: 12/18/2010 [facility] Patient Information 2015 [place], [place]. This information is not intended to replace advice given to you by your health care provider. Make sure you discuss any questions you have with your health care provider.  Nausea and Vomiting Nausea is a sick feeling that often comes  before throwing up (vomiting). Vomiting is a reflex where stomach contents come out of your mouth. Vomiting can cause severe loss of body fluids (dehydration). Children and elderly adults can become dehydrated quickly, especially if they also have diarrhea. Nausea and vomiting are symptoms of a condition or disease. It is important to find the cause of your symptoms. CAUSES   Direct irritation of the stomach lining. This irritation can result from increased acid production (gastroesophageal reflux disease), infection, food poisoning, taking certain medicines (such as nonsteroidal anti-inflammatory drugs), alcohol use, or tobacco use.  Signals from the brain.These signals could be caused by a headache, heat exposure, an inner ear disturbance, increased pressure in the brain from injury, infection, a tumor, or a concussion, pain, emotional stimulus, or metabolic problems.  An obstruction in the gastrointestinal tract (bowel obstruction).  Illnesses such as diabetes, hepatitis, gallbladder problems, appendicitis, kidney problems, cancer, sepsis, atypical symptoms of a heart attack, or eating disorders.  Medical treatments such as chemotherapy and radiation.  Receiving medicine that makes you sleep (general anesthetic) during surgery. DIAGNOSIS Your caregiver may ask for tests to be done if the problems do not improve after a few days. Tests may also be done if symptoms are severe or if the reason for the nausea and vomiting is not clear. Tests may include:  Urine tests.  Blood tests.  Stool tests.  Cultures (to look for evidence of infection).  X-rays or other imaging studies. Test results can help your caregiver make decisions about treatment or the need for additional tests. TREATMENT You need to stay well hydrated. Drink frequently but in small amounts.You may wish to drink water, sports drinks, clear broth, or eat frozen ice pops or gelatin dessert to help stay hydrated.When you eat,  eating slowly may help prevent nausea.There are also some antinausea medicines that may help prevent nausea. HOME CARE INSTRUCTIONS   Take all medicine as directed by your caregiver.  If you do not have an appetite, do not force yourself to eat. However, you must continue to drink fluids.  If you have an appetite, eat a normal diet unless your caregiver tells you differently.  Eat a variety of complex carbohydrates (rice, wheat, potatoes, bread), lean meats, yogurt, fruits, and vegetables.  Avoid high-fat foods because they are more difficult to digest.  Drink enough water and fluids to keep your urine clear or pale yellow.  If you are dehydrated, ask your caregiver for specific rehydration instructions. Signs of dehydration may include:  Severe thirst.  Dry lips and mouth.  Dizziness.  Dark urine.  Decreasing urine frequency and amount.  Confusion.  Rapid breathing or pulse. SEEK IMMEDIATE MEDICAL CARE IF:   You have blood or brown flecks (like coffee grounds) in your vomit.  You have black or bloody stools.  You have a severe headache or stiff neck.  You are confused.  You have severe abdominal pain.  You have chest pain or trouble breathing.  You do not urinate at least once every 8 hours.  You develop cold or clammy skin.  You continue to vomit for longer than 24 to 48 hours.  You have a  fever. MAKE SURE YOU:   Understand these instructions.  Will watch your condition.  Will get help right away if you are not doing well or get worse. Document Released: 04/30/2005 Document Revised: 07/23/2011 Document Reviewed: 09/27/2010 [facility] Patient Information 2015 [place], [place]. This information is not intended to replace advice given to you by your health care provider. Make sure you discuss any questions you have with your health care provider.  Rehydration, Adult Rehydration is the replacement of body fluids lost during dehydration. Dehydration is an  extreme loss of body fluids to the point of body function impairment. There are many ways extreme fluid loss can occur, including vomiting, diarrhea, or excess sweating. Recovering from dehydration requires replacing lost fluids, continuing to eat to maintain strength, and avoiding foods and beverages that may contribute to further fluid loss or may increase nausea. HOW TO REHYDRATE In most cases, rehydration involves the replacement of not only fluids but also carbohydrates and basic body salts. Rehydration with an oral rehydration solution is one way to replace essential nutrients lost through dehydration. An oral rehydration solution can be purchased at pharmacies, retail stores, and online. Premixed packets of powder that you combine with water to make a solution are also sold. You can prepare an oral rehydration solution at home by mixing the following ingredients together:    - tsp table salt.   tsp baking soda.   tsp salt substitute containing potassium chloride.  1 tablespoons sugar.  1 L (34 oz) of water. Be sure to use exact measurements. Including too much sugar can make diarrhea worse. Drink -1 cup (120-240 mL) of oral rehydration solution each time you have diarrhea or vomit. If drinking this amount makes your vomiting worse, try drinking smaller amounts more often. For example, drink 1-3 tsp every 5-10 minutes.  A general rule for staying hydrated is to drink 1-2 L of fluid per day. Talk to your caregiver about the specific amount you should be drinking each day. Drink enough fluids to keep your urine clear or pale yellow. EATING WHEN DEHYDRATED Even if you have had severe sweating or you are having diarrhea, do not stop eating. Many healthy items in a normal diet are okay to continue eating while recovering from dehydration. The following tips can help you to lessen nausea when you eat:  Ask someone else to prepare your food. Cooking smells may worsen nausea.  Eat in a  well-ventilated room away from cooking smells.  Sit up when you eat. Avoid lying down until 1-2 hours after eating.  Eat small amounts when you eat.  Eat foods that are easy to digest. These include soft, well-cooked, or mashed foods. FOODS AND BEVERAGES TO AVOID Avoid eating or drinking the following foods and beverages that may increase nausea or further loss of fluid:   Fruit juices with a high sugar content, such as concentrated juices.  Alcohol.  Beverages containing caffeine.  Carbonated drinks. They may cause a lot of gas.  Foods that may cause a lot of gas, such as cabbage, broccoli, and beans.  Fatty, greasy, and fried foods.  Spicy, very salty, and very sweet foods or drinks.  Foods or drinks that are very hot or very cold. Consume food or drinks at or near room temperature.  Foods that need a lot of chewing, such as raw vegetables.  Foods that are sticky or hard to swallow, such as peanut butter. Document Released: 07/23/2011 Document Revised: 01/23/2012 Document Reviewed: 07/23/2011 [employer] Patient Information 2015 [employer]. This information is not intended to replace advice given to you by your health care provider. Make sure you discuss any questions you have with your health care provider.

## 2014-08-19 NOTE — Progress Notes (Signed)
Randall Hampshire, MD [place] [address]  CLL (chronic lymphocytic leukemia) - Plan: Schedule Portacath Flush Appointment, heparin lock flush 100 unit/mL, sodium chloride 0.9 % injection 10 mL, CBC with Differential, Comprehensive metabolic panel, Lactate dehydrogenase  CURRENT THERAPY: Ibrutinib 280 mg daily   INTERVAL HISTORY: Randall Reyes 79 y.o. male returns for followup of CLL. He was just discharged from the inpatient service at [facility] where he was admitted for a bilateral pneumonia. In the clinic today he is actively vomiting. He is in a wheelchair and appears very uncomfortable. His wife states he has been vomiting for 2 days. He says his breathing and cough are improved but he cannot tolerate any oral intake.    CLL (chronic lymphocytic leukemia)   02/14/2005 Procedure Peripheral flow cytometry- CLL (without 11q or 17 p deletion) presenting with stage 0 disease   10/25/2008 Progression Increasing WBC and symptoms suggestive of progressive disease   11/09/2008 - 01/07/2009 Chemotherapy Rituxan, fludarabine, cytoxan x 3 cycles with the discontinuation of fludarabine and cytoxan due to severe pancytopenia requiring hospitalization.   01/10/2009 Adverse Reaction Severe pancytopenia and adverse side effects   02/01/2009 - 04/01/2009 Chemotherapy Rituxan every 21 days x 3 cycles   04/02/2009 Remission Stable disease.  Chemotherapy holiday started   06/22/2013 - 06/29/2013 Chemotherapy Imbruvica daily   06/29/2013 Adverse Reaction Large right posterior leg (popliteal) ecchymosis/violaceous rash.  Likely Imbruvica-induced   07/31/2013 - 10/12/2013 Chemotherapy Zydelig (idelalisib) 150 mg BID monotherapy   10/12/2013 Adverse Reaction Transaminitis, suspected to be secondary to [place].  Dose decreased.   10/12/2013 - 11/09/2013 Chemotherapy Zydelig (idelalisib) 150 mg daily.   11/09/2013 Adverse Reaction Worsening transaminitis.  Zydelig on HOLD   11/09/2013 Treatment  Plan Change Hold Zydelig   12/28/2013 Adverse Reaction Herpes Zoster outbreak.  Partial response with Famvir x 1 week, switched to acyclovir.  Progression over 1-2 weeks with development of keratoconjunctivitis bilaterally.  VZ Immune Globulin 625 mg IM given on 01/13/14.   02/08/2014 Treatment Plan Change Ibrutinib restarted 140 mg daily for one week followed by 280 mg daily followed by 420 mg daily with prophylactic acyclovir.   02/08/2014 - 07/05/2014 Chemotherapy Ibrutinib 420 mg daily   1Jun 03, 202015 - 04/17/2014 Hospital Admission Right lower lobe pneumonia   07/05/2014 Adverse Reaction Fissuring of fingertips and sore, red feet with transaminitis   07/05/2014 Treatment Plan Change Hold Ibrutinib   07/22/2014 -  Chemotherapy Ibrutinib restarted at 280 mg daily.      Past Medical History  Diagnosis Date  . Diabetes mellitus   . Leukemia 6.22.2012    PER PATIENT  . Hypertension   . Ulcer   . Anemia   . CLL (chronic lymphocytic leukemia) 01/24/2011  . Melanoma in situ 01/24/2011  . DM (diabetes mellitus) 01/24/2011  . Mild obesity 01/24/2011  . Eczema 01/24/2011  . Port catheter in place 05/02/2012  . Shingles   . History of pneumonia 04/2014    has CLL (chronic lymphocytic leukemia); Melanoma in situ; DM (diabetes mellitus); Mild obesity; Eczema; Port catheter in place; Pneumonia; Sepsis due to pneumonia; Leukocytosis; HCAP (healthcare-associated pneumonia); AKI (acute kidney injury); Hyperkalemia; and Malnutrition of moderate degree on his problem list.     is allergic to aspirin and ibuprofen.  Randall Reyes does not currently have medications on file.  Past Surgical History  Procedure Laterality Date  . Hernia repair  2001  . Bleeding ulcer    . Portacath placement  2008  . Cataract extraction w/phaco  05/05/2012    Procedure: CATARACT EXTRACTION PHACO AND INTRAOCULAR LENS PLACEMENT (IOC);  Surgeon: Tonny Branch, MD;  Location: [facility];  Service: Ophthalmology;  Laterality: Right;   CDE:13.25  . Cataract extraction w/phaco  05/15/2012    Procedure: CATARACT EXTRACTION PHACO AND INTRAOCULAR LENS PLACEMENT (IOC);  Surgeon: Tonny Branch, MD;  Location: [facility];  Service: Ophthalmology;  Laterality: Left;  CDE:  12.32    Denies any headaches, dizziness, double vision, fevers, chills, night sweats, nausea, vomiting, diarrhea, constipation, chest pain, heart palpitations, blood in stool, black tarry stool, urinary pain, urinary burning, urinary frequency, hematuria.   PHYSICAL EXAMINATION  ECOG PERFORMANCE STATUS: 2 - Symptomatic, <50% confined to bed  Filed Vitals:   08/19/14 1040  BP: 108/69  Pulse: 119  Resp: 22    GENERAL: In a wheelchair, holding an emesis basin. He appears uncomfortable and ill  SKIN: skin color, texture, turgor are normal, no rashes or significant lesions, fissures continue to improve HEAD: Normocephalic, No masses, lesions, tenderness or abnormalities EYES: normal, PERRLA, EOMI, Conjunctiva are pink and non-injected EARS: External ears normal OROPHARYNX:lips, buccal mucosa, and tongue normal and mucous membranes are moist  NECK: supple, trachea midline LYMPH:  not examined BREAST:not examined LUNGS: clear to auscultation  HEART: regular rate & rhythm, no murmurs, no gallops, S1 normal and S2 normal ABDOMEN:abdomen soft and normal bowel sounds BACK: Back symmetric, no curvature. NEURO: alert & oriented x 3 with fluent speech, no focal motor/sensory deficits, gait normal   LABORATORY DATA: CBC    Component Value Date/Time   WBC 22.9* 08/19/2014 1214   RBC 2.75* 08/19/2014 1214   RBC 2.82* 05/21/2014 1422   HGB 9.7* 08/19/2014 1214   HCT 30.3* 08/19/2014 1214   PLT 395 08/19/2014 1214   MCV 110.2* 08/19/2014 1214   MCH 35.3* 08/19/2014 1214   MCHC 32.0 08/19/2014 1214   RDW 15.6* 08/19/2014 1214   LYMPHSABS 4.4* 08/19/2014 1214   MONOABS 1.5* 08/19/2014 1214   EOSABS 0.8* 08/19/2014 1214   BASOSABS 0.1 08/19/2014 1214       Chemistry      Component Value Date/Time   NA 129* 08/19/2014 1214   K 4.8 08/19/2014 1214   CL 101 08/19/2014 1214   CO2 17* 08/19/2014 1214   BUN 31* 08/19/2014 1214   CREATININE 2.37* 08/19/2014 1214      Component Value Date/Time   CALCIUM 9.0 08/19/2014 1214   ALKPHOS 124* 08/19/2014 1214   AST 49* 08/19/2014 1214   ALT 31 08/19/2014 1214   BILITOT 0.4 08/19/2014 1214        ASSESSMENT AND PLAN:   CLL  In his recurrent pulmonary infections and other difficulties of late I am going to recommend discontinuing ibrutinib. We will reassess this at his next office visit.  Nausea/vomiting  We have transferred the patient down to the emergency room for additional evaluation. I am concerned about his overall physical reserve and his current symptoms. I feel that if his nausea vomiting continue he will end up hospitalized secondary to dehydration.  We will plan on trying to see him back at the end of next week. I definitely feel he needs a break off of therapy. Again we will address this at follow-up. But I have asked his wife to please hold his medication.  All questions were answered. The patient knows to call the clinic with any problems, questions or concerns. We can certainly see the patient much sooner  if necessary.  This note is electronically signed by: Molli Hazard MD 08/19/2014 5:07 PM

## 2014-08-19 NOTE — ED Notes (Signed)
Pt ambulated around ED times one. Pt reports no symptoms. Mildy unsteady on his feet but he and his family state that is baseline. Pt and family in agreement to discharge home. Post ambulation VS BP 107/58 SPO2 97 RA HR 118

## 2014-08-20 ENCOUNTER — Encounter (HOSPITAL_COMMUNITY): Payer: Medicare Other

## 2014-08-21 LAB — URINE CULTURE
CULTURE: NO GROWTH
Colony Count: NO GROWTH

## 2014-08-24 ENCOUNTER — Other Ambulatory Visit (HOSPITAL_COMMUNITY): Payer: Self-pay | Admitting: Family Medicine

## 2014-08-24 ENCOUNTER — Ambulatory Visit (HOSPITAL_COMMUNITY)
Admission: RE | Admit: 2014-08-24 | Discharge: 2014-08-24 | Disposition: A | Discharge status: Home / Self Care | Payer: Medicare Other | Source: Ambulatory Visit | Attending: Family Medicine | Admitting: Family Medicine

## 2014-08-24 DIAGNOSIS — I517 Cardiomegaly: Secondary | ICD-10-CM | POA: Diagnosis not present

## 2014-08-24 DIAGNOSIS — I10 Essential (primary) hypertension: Secondary | ICD-10-CM | POA: Insufficient documentation

## 2014-08-24 DIAGNOSIS — J189 Pneumonia, unspecified organism: Secondary | ICD-10-CM

## 2014-08-24 DIAGNOSIS — E119 Type 2 diabetes mellitus without complications: Secondary | ICD-10-CM | POA: Diagnosis not present

## 2014-09-09 NOTE — Progress Notes (Signed)
Randall Hampshire, MD [place] [address]  CLL (chronic lymphocytic leukemia) - Plan: CBC with Differential, Comprehensive metabolic panel, Lactate dehydrogenase  CURRENT THERAPY: Ibrutinib 280 mg daily ON HOLD  INTERVAL HISTORY: Randall Reyes 79 y.o. male returns for followup of CLL.    CLL (chronic lymphocytic leukemia)   02/14/2005 Procedure Peripheral flow cytometry- CLL (without 11q or 17 p deletion) presenting with stage 0 disease   10/25/2008 Progression Increasing WBC and symptoms suggestive of progressive disease   11/09/2008 - 01/07/2009 Chemotherapy Rituxan, fludarabine, cytoxan x 3 cycles with the discontinuation of fludarabine and cytoxan due to severe pancytopenia requiring hospitalization.   01/10/2009 Adverse Reaction Severe pancytopenia and adverse side effects   02/01/2009 - 04/01/2009 Chemotherapy Rituxan every 21 days x 3 cycles   04/02/2009 Remission Stable disease.  Chemotherapy holiday started   06/22/2013 - 06/29/2013 Chemotherapy Imbruvica daily   06/29/2013 Adverse Reaction Large right posterior leg (popliteal) ecchymosis/violaceous rash.  Likely Imbruvica-induced   07/31/2013 - 10/12/2013 Chemotherapy Zydelig (idelalisib) 150 mg BID monotherapy   10/12/2013 Adverse Reaction Transaminitis, suspected to be secondary to [place].  Dose decreased.   10/12/2013 - 11/09/2013 Chemotherapy Zydelig (idelalisib) 150 mg daily.   11/09/2013 Adverse Reaction Worsening transaminitis.  Zydelig on HOLD   11/09/2013 Treatment Plan Change Hold Zydelig   12/28/2013 Adverse Reaction Herpes Zoster outbreak.  Partial response with Famvir x 1 week, switched to acyclovir.  Progression over 1-2 weeks with development of keratoconjunctivitis bilaterally.  VZ Immune Globulin 625 mg IM given on 01/13/14.   02/08/2014 Treatment Plan Change Ibrutinib restarted 140 mg daily for one week followed by 280 mg daily followed by 420 mg daily with prophylactic acyclovir.   02/08/2014 -  07/05/2014 Chemotherapy Ibrutinib 420 mg daily   116-Dec-202015 - 04/17/2014 Hospital Admission Right lower lobe pneumonia   07/05/2014 Adverse Reaction Fissuring of fingertips and sore, red feet with transaminitis   07/05/2014 Treatment Plan Change Hold Ibrutinib   07/22/2014 - 08/19/2014 Chemotherapy Ibrutinib restarted at 280 mg daily.   08/09/2014 - 08/12/2014 Hospital Admission Bilateral pneumonia   08/19/2014 Adverse Reaction Nausea with vomiting.  Escorted to ED.   08/19/2014 Treatment Plan Change Ibrutinib on hold- ED escort from clinic      I personally reviewed and went over laboratory results with the patient.  The results are noted within this dictation.  Chart reviewed.  See last note for encounter details.  Ibrutinib was placed on hold on 08/19/2014 for nausea with vomiting and declining performance status.  Since treatment, Randall Reyes has had many complications, despite changes in therapy and decreasing doses.  Therefore, we will hold all therapy for the time being.  The patient and his wife are agreeable to this plan.  He saw Dr. Everette Reyes recently regarding a left lateral ankle pain.  He had x-rays which were unimpressive.  His wife reports that years ago, he sprained that ankle badly.  He reports that ice, raising the foot on a pillow, and avoiding 100% weight bearing helps.  He uses Tylenol PRN.  Weight bearing exacerbates the pain.   No calf pain, no erythema, no heat, and negative Homan's sign.  Hematologically, he otherwise denies any complaints and ROS questioning is negative.  Past Medical History  Diagnosis Date  . Diabetes mellitus   . Leukemia 6.22.2012    PER PATIENT  . Hypertension   . Ulcer   . Anemia   . CLL (chronic lymphocytic leukemia) 01/24/2011  .  Melanoma in situ 01/24/2011  . DM (diabetes mellitus) 01/24/2011  . Mild obesity 01/24/2011  . Eczema 01/24/2011  . Port catheter in place 05/02/2012  . Shingles   . History of pneumonia 04/2014    has CLL (chronic  lymphocytic leukemia); Melanoma in situ; DM (diabetes mellitus); Mild obesity; Eczema; Port catheter in place; Pneumonia; Sepsis due to pneumonia; Leukocytosis; HCAP (healthcare-associated pneumonia); AKI (acute kidney injury); Hyperkalemia; and Malnutrition of moderate degree on his problem list.     is allergic to aspirin and ibuprofen.  Randall Reyes does not currently have medications on file.  Past Surgical History  Procedure Laterality Date  . Hernia repair  2001  . Bleeding ulcer    . Portacath placement  2008  . Cataract extraction w/phaco  05/05/2012    Procedure: CATARACT EXTRACTION PHACO AND INTRAOCULAR LENS PLACEMENT (IOC);  Surgeon: Randall Branch, MD;  Location: [facility];  Service: Ophthalmology;  Laterality: Right;  CDE:13.25  . Cataract extraction w/phaco  05/15/2012    Procedure: CATARACT EXTRACTION PHACO AND INTRAOCULAR LENS PLACEMENT (IOC);  Surgeon: Randall Branch, MD;  Location: [facility];  Service: Ophthalmology;  Laterality: Left;  CDE:  12.32    Denies any headaches, dizziness, double vision, fevers, chills, night sweats, nausea, vomiting, diarrhea, constipation, chest pain, heart palpitations, shortness of breath, blood in stool, black tarry stool, urinary pain, urinary burning, urinary frequency, hematuria.   PHYSICAL EXAMINATION  ECOG PERFORMANCE STATUS: 0 - Asymptomatic  Filed Vitals:   09/10/14 1114  BP: 106/68  Pulse: 108  Temp: 98.2 F (36.8 C)  Resp: 18    GENERAL:alert, no distress, well nourished, well developed, comfortable, cooperative and smiling SKIN: skin color, texture, turgor are normal, no rashes or significant lesions HEAD: Normocephalic, No masses, lesions, tenderness or abnormalities EYES: normal, PERRLA, EOMI, Conjunctiva are pink and non-injected EARS: External ears normal OROPHARYNX:lips, buccal mucosa, and tongue normal and mucous membranes are moist  NECK: supple, no adenopathy, thyroid normal size, non-tender, without nodularity, no stridor,  non-tender, trachea midline LYMPH:  no palpable lymphadenopathy BREAST:not examined LUNGS: clear to auscultation and percussion HEART: regular rate & rhythm, no murmurs, no gallops, S1 normal and S2 normal ABDOMEN:abdomen soft, non-tender, normal bowel sounds and no masses or organomegaly BACK: Back symmetric, no curvature. EXTREMITIES:less then 2 second capillary refill, no joint deformities, effusion, or inflammation, no skin discoloration, no cyanosis.  Negative Homan's Sign on left. NEURO: alert & oriented x 3 with fluent speech, no focal motor/sensory deficits, gait normal   LABORATORY DATA: CBC    Component Value Date/Time   WBC 17.6* 08/19/2014 1734   RBC 2.49* 08/19/2014 1734   RBC 2.82* 05/21/2014 1422   HGB 8.8* 08/19/2014 1734   HCT 27.4* 08/19/2014 1734   PLT 347 08/19/2014 1734   MCV 110.0* 08/19/2014 1734   MCH 35.3* 08/19/2014 1734   MCHC 32.1 08/19/2014 1734   RDW 15.5 08/19/2014 1734   LYMPHSABS 4.9* 08/19/2014 1734   MONOABS 0.9 08/19/2014 1734   EOSABS 0.2 08/19/2014 1734   BASOSABS 0.1 08/19/2014 1734      Chemistry      Component Value Date/Time   NA 131* 08/19/2014 1734   K 5.3* 08/19/2014 1734   CL 104 08/19/2014 1734   CO2 20 08/19/2014 1734   BUN 29* 08/19/2014 1734   CREATININE 2.23* 08/19/2014 1734      Component Value Date/Time   CALCIUM 8.6 08/19/2014 1734   ALKPHOS 124* 08/19/2014 1214   AST 49* 08/19/2014 1214  ALT 31 08/19/2014 1214   BILITOT 0.4 08/19/2014 1214       RADIOGRAPHIC STUDIES:  Dg Chest 2 View  08/24/2014   CLINICAL DATA:  Pneumonia.  Hypertension.  Diabetic.  EXAM: CHEST  2 VIEW  COMPARISON:  08/19/2014  FINDINGS: Mild upper lumbar compression deformity is chronic back to 07/28/2012 CT. A right-sided Port-A-Cath terminates at the low SVC.  Midline trachea. Normal heart size. Mildly tortuous descending thoracic aorta, with atherosclerosis in the transverse segment. Mediastinal contours otherwise within normal limits.  No pleural effusion or pneumothorax. Improved inspiratory effort, with mild bibasilar volume loss. No lobar consolidation. No congestive failure.  IMPRESSION: Cardiomegaly and mild bibasilar volume loss. No evidence of pneumonia or congestive heart failure.   Electronically Signed   By: Abigail Miyamoto M.D.   On: 08/24/2014 14:24   Dg Chest 2 View  08/19/2014   CLINICAL DATA:  Two days of nausea and vomiting ; history of leukemia and diabetes  EXAM: CHEST  2 VIEW  COMPARISON:  PA and lateral chest of August 09, 2014  FINDINGS: The lungs are hypoinflated. There is no focal infiltrate. There is no pleural effusion. The cardiac silhouette is mildly enlarged. The pulmonary vascularity is not engorged. The Port-A-Cath appliance tip projects over the midportion of the SVC. The mediastinum is normal in width. The bony thorax is unremarkable.  IMPRESSION: There is mild hypoinflation. There is no pneumonia, CHF, nor other acute cardiopulmonary disease.   Electronically Signed   By: David  [place]   On: 08/19/2014 13:23     ASSESSMENT AND PLAN:  CLL (chronic lymphocytic leukemia) Pleasant 79 year old male who is S/P a 3 week holding of ibrutinib 280 mg (does reduced) for CLL due to significant nausea, vomiting, and performance decline.  On 4/7/22016 he was escorted to the ED from the [facility].  He is much improved.  His appetite is improved.  He denies any nausea or vomiting.  We will hold off on restating his therapy.  He has experienced complication after complication from treatment.    Labs today.  No clinical findings suspicious for DVT in left leg.  Return in 4 weeks for follow-up.     THERAPY PLAN:  Hold therapy at this time.  All questions were answered. The patient knows to call the clinic with any problems, questions or concerns. We can certainly see the patient much sooner if necessary.  Patient and plan discussed with Dr. Ancil Linsey and she is in agreement with the aforementioned.    This note is electronically signed by: Robynn Pane 09/10/2014 11:52 AM

## 2014-09-09 NOTE — Assessment & Plan Note (Addendum)
Pleasant 79 year old male who is S/P a 3 week holding of ibrutinib 280 mg (does reduced) for CLL due to significant nausea, vomiting, and performance decline.  On 4/7/22016 he was escorted to the ED from the [facility].  He is much improved.  His appetite is improved.  He denies any nausea or vomiting.  We will hold off on restating his therapy.  He has experienced complication after complication from treatment.    Labs today.  No clinical findings suspicious for DVT in left leg.  Return in 4 weeks for follow-up.

## 2014-09-10 ENCOUNTER — Encounter (HOSPITAL_BASED_OUTPATIENT_CLINIC_OR_DEPARTMENT_OTHER): Payer: Medicare Other | Admitting: Oncology

## 2014-09-10 ENCOUNTER — Encounter (HOSPITAL_COMMUNITY): Payer: Self-pay | Admitting: Oncology

## 2014-09-10 ENCOUNTER — Ambulatory Visit (HOSPITAL_COMMUNITY): Payer: Medicare Other | Admitting: Hematology & Oncology

## 2014-09-10 VITALS — BP 106/68 | HR 108 | Temp 98.2°F | Resp 18 | Wt 127.0 lb

## 2014-09-10 DIAGNOSIS — C9110 Chronic lymphocytic leukemia of B-cell type not having achieved remission: Secondary | ICD-10-CM

## 2014-09-10 DIAGNOSIS — M310 Hypersensitivity angiitis: Secondary | ICD-10-CM | POA: Diagnosis present

## 2014-09-10 LAB — CBC WITH DIFFERENTIAL/PLATELET
BASOS ABS: 0 10*3/uL (ref 0.0–0.1)
Basophils Relative: 0 % (ref 0–1)
Eosinophils Absolute: 1.2 10*3/uL — ABNORMAL HIGH (ref 0.0–0.7)
Eosinophils Relative: 11 % — ABNORMAL HIGH (ref 0–5)
HCT: 32.6 % — ABNORMAL LOW (ref 39.0–52.0)
Hemoglobin: 10.2 g/dL — ABNORMAL LOW (ref 13.0–17.0)
LYMPHS ABS: 4.6 10*3/uL — AB (ref 0.7–4.0)
LYMPHS PCT: 41 % (ref 12–46)
MCH: 35.3 pg — AB (ref 26.0–34.0)
MCHC: 31.3 g/dL (ref 30.0–36.0)
MCV: 112.8 fL — AB (ref 78.0–100.0)
MONO ABS: 1.1 10*3/uL — AB (ref 0.1–1.0)
Monocytes Relative: 10 % (ref 3–12)
NEUTROS ABS: 4.3 10*3/uL (ref 1.7–7.7)
NEUTROS PCT: 38 % — AB (ref 43–77)
Platelets: 421 10*3/uL — ABNORMAL HIGH (ref 150–400)
RBC: 2.89 MIL/uL — AB (ref 4.22–5.81)
RDW: 16.8 % — ABNORMAL HIGH (ref 11.5–15.5)
WBC: 11.2 10*3/uL — AB (ref 4.0–10.5)

## 2014-09-10 LAB — COMPREHENSIVE METABOLIC PANEL
ALK PHOS: 96 U/L (ref 39–117)
ALT: 20 U/L (ref 0–53)
ANION GAP: 6 (ref 5–15)
AST: 31 U/L (ref 0–37)
Albumin: 3 g/dL — ABNORMAL LOW (ref 3.5–5.2)
BUN: 24 mg/dL — AB (ref 6–23)
CHLORIDE: 108 mmol/L (ref 96–112)
CO2: 22 mmol/L (ref 19–32)
Calcium: 9.2 mg/dL (ref 8.4–10.5)
Creatinine, Ser: 1.43 mg/dL — ABNORMAL HIGH (ref 0.50–1.35)
GFR calc Af Amer: 53 mL/min — ABNORMAL LOW (ref >90)
GFR calc non Af Amer: 46 mL/min — ABNORMAL LOW (ref >90)
GLUCOSE: 109 mg/dL — AB (ref 70–99)
Potassium: 5.4 mmol/L — ABNORMAL HIGH (ref 3.5–5.1)
SODIUM: 136 mmol/L (ref 135–145)
Total Bilirubin: 0.4 mg/dL (ref 0.3–1.2)
Total Protein: 8 g/dL (ref 6.0–8.3)

## 2014-09-10 LAB — LACTATE DEHYDROGENASE: LDH: 136 U/L (ref 94–250)

## 2014-09-10 NOTE — Patient Instructions (Signed)
[place] at [facility] Discharge Instructions  RECOMMENDATIONS MADE BY THE CONSULTANT AND ANY TEST RESULTS WILL BE SENT TO YOUR REFERRING PHYSICIAN.  Exam and discussion by Robynn Pane, PA-C Will continue to hold the chemotherapy pill for now. Report fevers, chills, or other concerns. If any concerns with labs today we will call you.  Follow-up in 1 month with port flush and office visit.    Thank you for choosing [place] at [facility] to provide your oncology and hematology care.  To afford each patient quality time with our provider, please arrive at least 15 minutes before your scheduled appointment time.    You need to re-schedule your appointment should you arrive 10 or more minutes late.  We strive to give you quality time with our providers, and arriving late affects you and other patients whose appointments are after yours.  Also, if you no show three or more times for appointments you may be dismissed from the clinic at the providers discretion.     Again, thank you for choosing [facility].  Our hope is that these requests will decrease the amount of time that you wait before being seen by our physicians.       _____________________________________________________________  Should you have questions after your visit to [facility], please contact our office at [phone] between the hours of 8:30 a.m. and 4:30 p.m.  Voicemails left after 4:30 p.m. will not be returned until the following business day.  For prescription refill requests, have your pharmacy contact our office.

## 2014-09-10 NOTE — Progress Notes (Signed)
Randall Reyes presented for labwork. Labs per MD order drawn via Peripheral Line 23 gauge needle inserted in right AC  Good blood return present. Procedure without incident.  Needle removed intact. Patient tolerated procedure well.

## 2014-10-01 ENCOUNTER — Encounter (HOSPITAL_COMMUNITY): Payer: Medicare Other

## 2014-10-07 ENCOUNTER — Other Ambulatory Visit (HOSPITAL_COMMUNITY): Payer: Self-pay | Admitting: Family Medicine

## 2014-10-07 ENCOUNTER — Ambulatory Visit (HOSPITAL_COMMUNITY)
Admission: RE | Admit: 2014-10-07 | Discharge: 2014-10-07 | Disposition: A | Discharge status: Home / Self Care | Payer: Medicare Other | Source: Ambulatory Visit | Attending: Family Medicine | Admitting: Family Medicine

## 2014-10-07 DIAGNOSIS — G8929 Other chronic pain: Secondary | ICD-10-CM | POA: Diagnosis not present

## 2014-10-07 DIAGNOSIS — M25572 Pain in left ankle and joints of left foot: Secondary | ICD-10-CM | POA: Diagnosis present

## 2014-10-08 ENCOUNTER — Encounter (HOSPITAL_COMMUNITY): Payer: Medicare Other | Attending: Oncology | Admitting: Hematology & Oncology

## 2014-10-08 ENCOUNTER — Encounter (HOSPITAL_COMMUNITY): Payer: Medicare Other

## 2014-10-08 VITALS — BP 129/76 | HR 103 | Temp 97.6°F | Resp 16 | Wt 127.4 lb

## 2014-10-08 DIAGNOSIS — M310 Hypersensitivity angiitis: Secondary | ICD-10-CM | POA: Insufficient documentation

## 2014-10-08 DIAGNOSIS — Z95828 Presence of other vascular implants and grafts: Secondary | ICD-10-CM

## 2014-10-08 DIAGNOSIS — E875 Hyperkalemia: Secondary | ICD-10-CM

## 2014-10-08 DIAGNOSIS — C9110 Chronic lymphocytic leukemia of B-cell type not having achieved remission: Secondary | ICD-10-CM | POA: Diagnosis present

## 2014-10-08 LAB — COMPREHENSIVE METABOLIC PANEL
ALBUMIN: 2.6 g/dL — AB (ref 3.5–5.0)
ALK PHOS: 103 U/L (ref 38–126)
ALT: 13 U/L — ABNORMAL LOW (ref 17–63)
AST: 20 U/L (ref 15–41)
Anion gap: 6 (ref 5–15)
BUN: 24 mg/dL — AB (ref 6–20)
CALCIUM: 9.1 mg/dL (ref 8.9–10.3)
CHLORIDE: 112 mmol/L — AB (ref 101–111)
CO2: 17 mmol/L — ABNORMAL LOW (ref 22–32)
Creatinine, Ser: 1.33 mg/dL — ABNORMAL HIGH (ref 0.61–1.24)
GFR calc non Af Amer: 50 mL/min — ABNORMAL LOW (ref >60)
GFR, EST AFRICAN AMERICAN: 58 mL/min — AB (ref >60)
Glucose, Bld: 107 mg/dL — ABNORMAL HIGH (ref 65–99)
POTASSIUM: 6.2 mmol/L — AB (ref 3.5–5.1)
Sodium: 135 mmol/L (ref 135–145)
Total Bilirubin: 0.3 mg/dL (ref 0.3–1.2)
Total Protein: 8.1 g/dL (ref 6.5–8.1)

## 2014-10-08 LAB — CBC WITH DIFFERENTIAL/PLATELET
BASOS PCT: 0 % (ref 0–1)
Basophils Absolute: 0 10*3/uL (ref 0.0–0.1)
EOS PCT: 2 % (ref 0–5)
Eosinophils Absolute: 0.2 10*3/uL (ref 0.0–0.7)
HEMATOCRIT: 30.6 % — AB (ref 39.0–52.0)
Hemoglobin: 9.6 g/dL — ABNORMAL LOW (ref 13.0–17.0)
Lymphocytes Relative: 44 % (ref 12–46)
Lymphs Abs: 4.2 10*3/uL — ABNORMAL HIGH (ref 0.7–4.0)
MCH: 34 pg (ref 26.0–34.0)
MCHC: 31.4 g/dL (ref 30.0–36.0)
MCV: 108.5 fL — ABNORMAL HIGH (ref 78.0–100.0)
Monocytes Absolute: 1.2 10*3/uL — ABNORMAL HIGH (ref 0.1–1.0)
Monocytes Relative: 13 % — ABNORMAL HIGH (ref 3–12)
NEUTROS ABS: 3.9 10*3/uL (ref 1.7–7.7)
NEUTROS PCT: 41 % — AB (ref 43–77)
Platelets: 324 10*3/uL (ref 150–400)
RBC: 2.82 MIL/uL — AB (ref 4.22–5.81)
RDW: 16.9 % — AB (ref 11.5–15.5)
WBC: 9.5 10*3/uL (ref 4.0–10.5)

## 2014-10-08 LAB — LACTATE DEHYDROGENASE: LDH: 114 U/L (ref 98–192)

## 2014-10-08 MED ORDER — HEPARIN SOD (PORK) LOCK FLUSH 100 UNIT/ML IV SOLN
500.0000 [IU] | Freq: Once | INTRAVENOUS | Status: AC
  Administered 2014-10-08: 500 [IU] via INTRAVENOUS
  Filled 2014-10-08: qty 5

## 2014-10-08 MED ORDER — SODIUM CHLORIDE 0.9 % IJ SOLN
10.0000 mL | Freq: Once | INTRAMUSCULAR | Status: AC
  Administered 2014-10-08: 10 mL via INTRAVENOUS

## 2014-10-08 NOTE — Progress Notes (Signed)
[place]all Hampshire, [place] [address]  CURRENT THERAPY: Observation  INTERVAL HISTORY: Randall Reyes 79 y.o. male returns for followup of CLL. He has had several recent hospitalizations and we opted to discontinue his ibrutinib. He looks much improved today.  He feels much better today. He is eating better and sleeping well. He has gained 7 lbs.  His only complaint today is foot pain. He has seen his PCP and notes that he had an XRAY.  Other than his foot he hasn't had any new pain anywhere.    CLL (chronic lymphocytic leukemia)   02/14/2005 Procedure Peripheral flow cytometry- CLL (without 11q or 17 p deletion) presenting with stage 0 disease   10/25/2008 Progression Increasing WBC and symptoms suggestive of progressive disease   11/09/2008 - 01/07/2009 Chemotherapy Rituxan, fludarabine, cytoxan x 3 cycles with the discontinuation of fludarabine and cytoxan due to severe pancytopenia requiring hospitalization.   01/10/2009 Adverse Reaction Severe pancytopenia and adverse side effects   02/01/2009 - 04/01/2009 Chemotherapy Rituxan every 21 days x 3 cycles   04/02/2009 Remission Stable disease.  Chemotherapy holiday started   06/22/2013 - 06/29/2013 Chemotherapy Imbruvica daily   06/29/2013 Adverse Reaction Large right posterior leg (popliteal) ecchymosis/violaceous rash.  Likely Imbruvica-induced   07/31/2013 - 10/12/2013 Chemotherapy Zydelig (idelalisib) 150 mg BID monotherapy   10/12/2013 Adverse Reaction Transaminitis, suspected to be secondary to [place].  Dose decreased.   10/12/2013 - 11/09/2013 Chemotherapy Zydelig (idelalisib) 150 mg daily.   11/09/2013 Adverse Reaction Worsening transaminitis.  Zydelig on HOLD   11/09/2013 Treatment Plan Change Hold Zydelig   12/28/2013 Adverse Reaction Herpes Zoster outbreak.  Partial response with Famvir x 1 week, switched to acyclovir.  Progression over 1-2 weeks with development of keratoconjunctivitis bilaterally.  VZ  Immune Globulin 625 mg IM given on 01/13/14.   02/08/2014 Treatment Plan Change Ibrutinib restarted 140 mg daily for one week followed by 280 mg daily followed by 420 mg daily with prophylactic acyclovir.   02/08/2014 - 07/05/2014 Chemotherapy Ibrutinib 420 mg daily   1March 23, 202015 - 04/17/2014 Hospital Admission Right lower lobe pneumonia   07/05/2014 Adverse Reaction Fissuring of fingertips and sore, red feet with transaminitis   07/05/2014 Treatment Plan Change Hold Ibrutinib   07/22/2014 - 08/19/2014 Chemotherapy Ibrutinib restarted at 280 mg daily.   08/09/2014 - 08/12/2014 Hospital Admission Bilateral pneumonia   08/19/2014 Adverse Reaction Nausea with vomiting.  Escorted to ED.   08/19/2014 Treatment Plan Change Ibrutinib on hold- ED escort from clinic       Past Medical History  Diagnosis Date  . Diabetes mellitus   . Leukemia 6.22.2012    PER PATIENT  . Hypertension   . Ulcer   . Anemia   . CLL (chronic lymphocytic leukemia) 01/24/2011  . Melanoma in situ 01/24/2011  . DM (diabetes mellitus) 01/24/2011  . Mild obesity 01/24/2011  . Eczema 01/24/2011  . Port catheter in place 05/02/2012  . Shingles   . History of pneumonia 04/2014    has CLL (chronic lymphocytic leukemia); Melanoma in situ; DM (diabetes mellitus); Mild obesity; Eczema; Port catheter in place; Pneumonia; Sepsis due to pneumonia; Leukocytosis; HCAP (healthcare-associated pneumonia); AKI (acute kidney injury); Hyperkalemia; and Malnutrition of moderate degree on his problem list.     is allergic to aspirin and ibuprofen.  We administered sodium chloride and heparin lock flush.  Past Surgical History  Procedure Laterality Date  . Hernia repair  2001  . Bleeding ulcer    .  Portacath placement  2008  . Cataract extraction w/phaco  05/05/2012    Procedure: CATARACT EXTRACTION PHACO AND INTRAOCULAR LENS PLACEMENT (IOC);  Surgeon: Tonny Branch, MD;  Location: [facility];  Service: Ophthalmology;  Laterality: Right;  CDE:13.25  . Cataract  extraction w/phaco  05/15/2012    Procedure: CATARACT EXTRACTION PHACO AND INTRAOCULAR LENS PLACEMENT (IOC);  Surgeon: Tonny Branch, MD;  Location: [facility];  Service: Ophthalmology;  Laterality: Left;  CDE:  12.32    Denies any headaches, dizziness, double vision, fevers, chills, night sweats, nausea, vomiting, diarrhea, constipation, chest pain, heart palpitations, blood in stool, black tarry stool, urinary pain, urinary burning, urinary frequency, hematuria. Positive for weight gain.   PHYSICAL EXAMINATION   ECOG PERFORMANCE STATUS: 1 - Symptomatic but completely ambulatory  Filed Vitals:   10/08/14 1007  BP: 129/76  Pulse: 103  Temp: 97.6 F (36.4 C)  Resp: 16    GENERAL:      He walks with a limp due to his left foot. SKIN: skin color, texture, turgor are normal, no rashes or significant lesions, fissures continue to improve HEAD: Normocephalic, No masses, lesions, tenderness or abnormalities EYES: normal, PERRLA, EOMI, Conjunctiva are pink and non-injected EARS: External ears normal OROPHARYNX:lips, buccal mucosa, and tongue normal and mucous membranes are moist  NECK: supple, trachea midline LYMPH:  No appreciable palpable adenopathy in the neck, supraclavicular regions, axillary area or groin BREAST:not examined LUNGS: clear to auscultation  HEART: regular rate & rhythm, no murmurs, no gallops, S1 normal and S2 normal ABDOMEN:abdomen soft and normal bowel sounds BACK: Back symmetric, no curvature. NEURO: alert & oriented x 3 with fluent speech, no focal motor/sensory deficits, gait normal (except small limp secondary to foot discomfort)   LABORATORY DATA: CBC    Component Value Date/Time   WBC 9.5 10/08/2014 1200   RBC 2.82* 10/08/2014 1200   RBC 2.82* 05/21/2014 1422   HGB 9.6* 10/08/2014 1200   HCT 30.6* 10/08/2014 1200   PLT 324 10/08/2014 1200   MCV 108.5* 10/08/2014 1200   MCH 34.0 10/08/2014 1200   MCHC 31.4 10/08/2014 1200   RDW 16.9* 10/08/2014 1200    LYMPHSABS 4.2* 10/08/2014 1200   MONOABS 1.2* 10/08/2014 1200   EOSABS 0.2 10/08/2014 1200   BASOSABS 0.0 10/08/2014 1200      Chemistry      Component Value Date/Time   NA 135 10/08/2014 1200   K 6.2* 10/08/2014 1200   CL 112* 10/08/2014 1200   CO2 17* 10/08/2014 1200   BUN 24* 10/08/2014 1200   CREATININE 1.33* 10/08/2014 1200      Component Value Date/Time   CALCIUM 9.1 10/08/2014 1200   ALKPHOS 103 10/08/2014 1200   AST 20 10/08/2014 1200   ALT 13* 10/08/2014 1200   BILITOT 0.3 10/08/2014 1200        ASSESSMENT AND PLAN:   CLL  He was diagnosed in 2006 and per records was treated with FCR in 2010 secondary to a rapid doubling time of his wbc count up to 290K with diffuse LAD, hypogammaglobulinemia. He only received 3 cycles of FCR secondary to prolonged pancytopenia.   He was started on Imbruvica in January of 2015 for a rising wbc. Other counts were relatively preserved. WBC count at that time was 36K. He was otherwise asymptomatic. Therapy was interrupted secondary to rash. He was then started on Idelalisib on 07/31/2013. This therapy was held secondary to zoster diagnosed on 12/28/2013. Patient was given Varicella Zoster Immune Globulin IM on 01/13/2014.  He was then restarted on Imbruvica on 02/08/2014. He was dose reduced secondary to abnormal LFT's and now therapy has been discontinued secondary to pneumonia and generalized decline. He is currently doing better.   I have recommended continuing to hold therapy. He does have an anemia which has been progressive, I will see him back in short interim to reassess his laboratory studies and his overall physical status. At that point we will make additional recommendations. Most likely continuing to hold therapy if he maintains his counts and is asymptomatic. His anemia may be CLL related and if it worsens we will proceed with an anemia evaluation including LDH, haptoglobin and most likely recommend a bone marrow biopsy.    HYPERKALEMIA  We will treat his hyperkalemia with Kayexalate. He is not on a potassium supplement. We will recheck his potassium level next week. He is on triamterene and hydrochlorothiazide. This may be the cause of his hyperkalemia and other electrolyte abnormalities. This may need to be discontinued and we can discuss this with his primary care physician.  All questions were answered. The patient knows to call the clinic with any problems, questions or concerns. We can certainly see the patient much sooner if necessary.  This document serves as a record of services personally performed by Ancil Linsey, MD. It was created on her behalf by Arlyce Harman, a trained medical scribe. The creation of this record is based on the scribe's personal observations and the provider's statements to them. This document has been checked and approved by the attending provider.  I have reviewed the above documentation for accuracy and completeness, and I agree with the above. This note was electronically signed.  Molli Hazard MD 10/11/2014 7:45 PM

## 2014-10-08 NOTE — Progress Notes (Signed)
Randall Reyes presented for labwork. Labs per MD order drawn via Peripheral Line 23 gauge needle inserted in right AC  Good blood return present. Procedure without incident.  Needle removed intact. Patient tolerated procedure well.

## 2014-10-08 NOTE — Progress Notes (Signed)
Randall Reyes presented for Portacath access and flush. Proper placement of portacath confirmed by CXR. Portacath located right chest wall accessed with  H 20 needle. No blood return. Flushes easily without pain or resistance. Portacath flushed with 31ml NS and 500U/52ml Heparin and needle removed intact. Procedure without incident. Patient tolerated procedure well.  CRITICAL VALUE ALERT Critical value received:  Potassium 6.2 Date of notification:  10/08/14  Time of notification: 7654 Critical value read back:  Yes.   Nurse who received alert:  T.Tristan Proto,RN MD notified (1st page):  Verbal notification to Dr.Penland at 1249  Prescription for Kayexalate 15 gms 1 dose tonight and 1 dose in am called to [facility]. Spoke with patient's wife, discussed previous. Verbalized understanding. RTC next week for repeat potassium level.

## 2014-10-08 NOTE — Patient Instructions (Signed)
[facility] at [facility] Discharge Instructions  RECOMMENDATIONS MADE BY THE CONSULTANT AND ANY TEST RESULTS WILL BE SENT TO YOUR REFERRING PHYSICIAN.  Exam and discussion by Dr. [name]. No changes at present. Will check some labs today. Call with any concerns Follow-up in 1 month.  Thank you for choosing [place] at [facility] to provide your oncology and hematology care.  To afford each patient quality time with our provider, please arrive at least 15 minutes before your scheduled appointment time.    You need to re-schedule your appointment should you arrive 10 or more minutes late.  We strive to give you quality time with our providers, and arriving late affects you and other patients whose appointments are after yours.  Also, if you no show three or more times for appointments you may be dismissed from the clinic at the providers discretion.     Again, thank you for choosing [facility].  Our hope is that these requests will decrease the amount of time that you wait before being seen by our physicians.       _____________________________________________________________  Should you have questions after your visit to [facility], please contact our office at [phone] between the hours of 8:30 a.m. and 4:30 p.m.  Voicemails left after 4:30 p.m. will not be returned until the following business day.  For prescription refill requests, have your pharmacy contact our office.

## 2014-10-08 NOTE — Progress Notes (Signed)
Please see doctors encounter for more inforamtion 

## 2014-10-11 ENCOUNTER — Encounter (HOSPITAL_COMMUNITY): Payer: Self-pay | Admitting: Hematology & Oncology

## 2014-10-12 ENCOUNTER — Telehealth (HOSPITAL_COMMUNITY): Payer: Self-pay | Admitting: Emergency Medicine

## 2014-10-12 DIAGNOSIS — C9110 Chronic lymphocytic leukemia of B-cell type not having achieved remission: Secondary | ICD-10-CM

## 2014-10-12 NOTE — Telephone Encounter (Signed)
Vitamin B12 and folate added to his lab work for June 1st, 2016

## 2014-10-12 NOTE — Telephone Encounter (Signed)
-----   Message from Patrici Ranks, MD sent at 10/10/2014  6:00 PM EDT ----- Add B12, folate to his follow-up k level. Dr.P

## 2014-10-13 ENCOUNTER — Encounter (HOSPITAL_COMMUNITY): Payer: Medicare Other | Attending: Oncology

## 2014-10-13 ENCOUNTER — Other Ambulatory Visit (HOSPITAL_COMMUNITY): Payer: Self-pay

## 2014-10-13 ENCOUNTER — Other Ambulatory Visit (HOSPITAL_COMMUNITY): Payer: Self-pay | Admitting: Hematology & Oncology

## 2014-10-13 DIAGNOSIS — C9110 Chronic lymphocytic leukemia of B-cell type not having achieved remission: Secondary | ICD-10-CM

## 2014-10-13 DIAGNOSIS — M310 Hypersensitivity angiitis: Secondary | ICD-10-CM | POA: Diagnosis present

## 2014-10-13 DIAGNOSIS — E875 Hyperkalemia: Secondary | ICD-10-CM

## 2014-10-13 DIAGNOSIS — D519 Vitamin B12 deficiency anemia, unspecified: Secondary | ICD-10-CM

## 2014-10-13 LAB — VITAMIN B12: Vitamin B-12: 114 pg/mL — ABNORMAL LOW (ref 180–914)

## 2014-10-13 LAB — FOLATE: Folate: 9.3 ng/mL (ref >5.9)

## 2014-10-13 LAB — POTASSIUM: Potassium: 4.4 mmol/L (ref 3.5–5.1)

## 2014-10-13 NOTE — Progress Notes (Signed)
Labs drawn

## 2014-10-14 ENCOUNTER — Other Ambulatory Visit (HOSPITAL_COMMUNITY): Payer: Self-pay | Admitting: Oncology

## 2014-10-14 ENCOUNTER — Other Ambulatory Visit (HOSPITAL_COMMUNITY): Payer: Self-pay

## 2014-10-14 DIAGNOSIS — D519 Vitamin B12 deficiency anemia, unspecified: Secondary | ICD-10-CM

## 2014-10-18 ENCOUNTER — Encounter (HOSPITAL_COMMUNITY): Payer: Medicare Other

## 2014-10-18 ENCOUNTER — Other Ambulatory Visit: Payer: Self-pay | Admitting: Podiatry

## 2014-10-18 ENCOUNTER — Encounter (HOSPITAL_COMMUNITY): Payer: Self-pay

## 2014-10-18 ENCOUNTER — Encounter (HOSPITAL_BASED_OUTPATIENT_CLINIC_OR_DEPARTMENT_OTHER): Payer: Medicare Other

## 2014-10-18 VITALS — BP 108/63 | HR 54 | Temp 97.8°F | Resp 18

## 2014-10-18 DIAGNOSIS — E538 Deficiency of other specified B group vitamins: Secondary | ICD-10-CM | POA: Diagnosis not present

## 2014-10-18 DIAGNOSIS — D519 Vitamin B12 deficiency anemia, unspecified: Secondary | ICD-10-CM

## 2014-10-18 DIAGNOSIS — C9110 Chronic lymphocytic leukemia of B-cell type not having achieved remission: Secondary | ICD-10-CM | POA: Diagnosis not present

## 2014-10-18 MED ORDER — CYANOCOBALAMIN 1000 MCG/ML IJ SOLN
INTRAMUSCULAR | Status: AC
  Filled 2014-10-18: qty 1

## 2014-10-18 MED ORDER — CYANOCOBALAMIN 1000 MCG/ML IJ SOLN
1000.0000 ug | INTRAMUSCULAR | Status: DC
  Administered 2014-10-18: 1000 ug via INTRAMUSCULAR

## 2014-10-18 NOTE — Progress Notes (Signed)
..  Randall Reyes's reason for visit today is for labs as scheduled per MD orders.  Venipuncture performed with a 23 gauge butterfly needle to R Antecubital.  Randall Reyes tolerated procedure well and without incident; questions were answered and patient was discharged. Marland [name] also presents today for injection per the provider's orders.  B12 administration without incident; see MAR for injection details.  Patient tolerated procedure well and without incident.  No questions or complaints noted at this time.

## 2014-10-19 LAB — INTRINSIC FACTOR ANTIBODIES: Intrinsic Factor: 1 AU/mL (ref 0.0–1.1)

## 2014-10-20 LAB — ANTI-PARIETAL ANTIBODY: PARIETAL CELL ANTIBODY-IGG: 4 U (ref 0.0–20.0)

## 2014-10-20 LAB — METHYLMALONIC ACID, SERUM: METHYLMALONIC ACID, QUANTITATIVE: 1129 nmol/L — AB (ref 0–378)

## 2014-10-25 ENCOUNTER — Other Ambulatory Visit (HOSPITAL_COMMUNITY): Payer: Medicare Other

## 2014-10-25 ENCOUNTER — Encounter (HOSPITAL_BASED_OUTPATIENT_CLINIC_OR_DEPARTMENT_OTHER): Payer: Medicare Other

## 2014-10-25 ENCOUNTER — Encounter (HOSPITAL_COMMUNITY): Payer: Medicare Other

## 2014-10-25 VITALS — BP 125/64 | HR 70 | Temp 98.4°F | Resp 18

## 2014-10-25 DIAGNOSIS — C9110 Chronic lymphocytic leukemia of B-cell type not having achieved remission: Secondary | ICD-10-CM | POA: Diagnosis not present

## 2014-10-25 DIAGNOSIS — E538 Deficiency of other specified B group vitamins: Secondary | ICD-10-CM | POA: Diagnosis not present

## 2014-10-25 DIAGNOSIS — D519 Vitamin B12 deficiency anemia, unspecified: Secondary | ICD-10-CM

## 2014-10-25 MED ORDER — CYANOCOBALAMIN 1000 MCG/ML IJ SOLN
1000.0000 ug | INTRAMUSCULAR | Status: DC
  Administered 2014-10-25: 1000 ug via INTRAMUSCULAR

## 2014-10-25 MED ORDER — CYANOCOBALAMIN 1000 MCG/ML IJ SOLN
INTRAMUSCULAR | Status: AC
  Filled 2014-10-25: qty 1

## 2014-10-25 NOTE — Progress Notes (Signed)
hmcy redrawn for lab error

## 2014-10-26 ENCOUNTER — Encounter (HOSPITAL_COMMUNITY): Payer: Self-pay

## 2014-10-26 NOTE — Patient Instructions (Signed)
[place] at [facility] Discharge Instructions  RECOMMENDATIONS MADE BY THE CONSULTANT AND ANY TEST RESULTS WILL BE SENT TO YOUR REFERRING PHYSICIAN.  B12 injection today. Return as scheduled for injections, lab work, and office visit.  Thank you for choosing [place] at [facility] to provide your oncology and hematology care.  To afford each patient quality time with our provider, please arrive at least 15 minutes before your scheduled appointment time.    You need to re-schedule your appointment should you arrive 10 or more minutes late.  We strive to give you quality time with our providers, and arriving late affects you and other patients whose appointments are after yours.  Also, if you no show three or more times for appointments you may be dismissed from the clinic at the providers discretion.     Again, thank you for choosing [facility].  Our hope is that these requests will decrease the amount of time that you wait before being seen by our physicians.       _____________________________________________________________  Should you have questions after your visit to [facility], please contact our office at [phone] between the hours of 8:30 a.m. and 4:30 p.m.  Voicemails left after 4:30 p.m. will not be returned until the following business day.  For prescription refill requests, have your pharmacy contact our office.

## 2014-10-26 NOTE — Progress Notes (Signed)
10/25/14, 1305:  Randall Reyes presents today for injection per the provider's orders.  B12 administration without incident; see MAR for injection details.  Patient tolerated procedure well and without incident.  No questions or complaints noted at this time.

## 2014-10-27 LAB — HOMOCYSTEINE: HOMOCYSTEINE-NORM: 15.1 umol/L — AB (ref 0.0–15.0)

## 2014-11-01 ENCOUNTER — Encounter (HOSPITAL_COMMUNITY): Payer: Self-pay

## 2014-11-01 ENCOUNTER — Encounter: Payer: Self-pay | Admitting: Hematology & Oncology

## 2014-11-01 ENCOUNTER — Encounter (HOSPITAL_BASED_OUTPATIENT_CLINIC_OR_DEPARTMENT_OTHER): Payer: Medicare Other

## 2014-11-01 VITALS — BP 108/68 | HR 93 | Temp 98.6°F | Resp 16

## 2014-11-01 DIAGNOSIS — C9110 Chronic lymphocytic leukemia of B-cell type not having achieved remission: Secondary | ICD-10-CM | POA: Diagnosis not present

## 2014-11-01 MED ORDER — CYANOCOBALAMIN 1000 MCG/ML IJ SOLN
INTRAMUSCULAR | Status: AC
  Filled 2014-11-01: qty 1

## 2014-11-01 MED ORDER — CYANOCOBALAMIN 1000 MCG/ML IJ SOLN
1000.0000 ug | Freq: Once | INTRAMUSCULAR | Status: AC
  Administered 2014-11-01: 1000 ug via INTRAMUSCULAR

## 2014-11-01 NOTE — Progress Notes (Signed)
..  Randall Reyes presents today for injection per the provider's orders.  Vitamin b12 administration without incident; see MAR for injection details.  Patient tolerated procedure well and without incident.  No questions or complaints noted at this time.

## 2014-11-09 ENCOUNTER — Encounter (HOSPITAL_BASED_OUTPATIENT_CLINIC_OR_DEPARTMENT_OTHER): Payer: Medicare Other | Admitting: Hematology & Oncology

## 2014-11-09 ENCOUNTER — Encounter (HOSPITAL_COMMUNITY): Payer: Self-pay | Admitting: Lab

## 2014-11-09 ENCOUNTER — Encounter (HOSPITAL_COMMUNITY): Payer: Medicare Other

## 2014-11-09 ENCOUNTER — Encounter (HOSPITAL_COMMUNITY): Payer: Self-pay | Admitting: Hematology & Oncology

## 2014-11-09 ENCOUNTER — Encounter (HOSPITAL_BASED_OUTPATIENT_CLINIC_OR_DEPARTMENT_OTHER): Payer: Medicare Other

## 2014-11-09 ENCOUNTER — Encounter (HOSPITAL_COMMUNITY): Payer: Medicare Other | Attending: Hematology & Oncology

## 2014-11-09 VITALS — BP 104/74 | HR 97 | Temp 98.2°F | Resp 18 | Wt 127.2 lb

## 2014-11-09 DIAGNOSIS — E538 Deficiency of other specified B group vitamins: Secondary | ICD-10-CM | POA: Diagnosis not present

## 2014-11-09 DIAGNOSIS — D519 Vitamin B12 deficiency anemia, unspecified: Secondary | ICD-10-CM

## 2014-11-09 DIAGNOSIS — R21 Rash and other nonspecific skin eruption: Secondary | ICD-10-CM

## 2014-11-09 DIAGNOSIS — C9110 Chronic lymphocytic leukemia of B-cell type not having achieved remission: Secondary | ICD-10-CM

## 2014-11-09 LAB — COMPREHENSIVE METABOLIC PANEL
ALT: 20 U/L (ref 17–63)
AST: 30 U/L (ref 15–41)
Albumin: 2.9 g/dL — ABNORMAL LOW (ref 3.5–5.0)
Alkaline Phosphatase: 99 U/L (ref 38–126)
Anion gap: 7 (ref 5–15)
BUN: 25 mg/dL — ABNORMAL HIGH (ref 6–20)
CHLORIDE: 107 mmol/L (ref 101–111)
CO2: 22 mmol/L (ref 22–32)
Calcium: 8.9 mg/dL (ref 8.9–10.3)
Creatinine, Ser: 1.38 mg/dL — ABNORMAL HIGH (ref 0.61–1.24)
GFR calc non Af Amer: 48 mL/min — ABNORMAL LOW (ref >60)
GFR, EST AFRICAN AMERICAN: 55 mL/min — AB (ref >60)
GLUCOSE: 104 mg/dL — AB (ref 65–99)
POTASSIUM: 5.3 mmol/L — AB (ref 3.5–5.1)
SODIUM: 136 mmol/L (ref 135–145)
Total Bilirubin: 0.4 mg/dL (ref 0.3–1.2)
Total Protein: 8.6 g/dL — ABNORMAL HIGH (ref 6.5–8.1)

## 2014-11-09 LAB — CBC WITH DIFFERENTIAL/PLATELET
BASOS PCT: 0 % (ref 0–1)
Basophils Absolute: 0 10*3/uL (ref 0.0–0.1)
EOS ABS: 0.5 10*3/uL (ref 0.0–0.7)
EOS PCT: 5 % (ref 0–5)
HCT: 33.8 % — ABNORMAL LOW (ref 39.0–52.0)
Hemoglobin: 10.3 g/dL — ABNORMAL LOW (ref 13.0–17.0)
Lymphocytes Relative: 41 % (ref 12–46)
Lymphs Abs: 4.4 10*3/uL — ABNORMAL HIGH (ref 0.7–4.0)
MCH: 32.3 pg (ref 26.0–34.0)
MCHC: 30.5 g/dL (ref 30.0–36.0)
MCV: 106 fL — ABNORMAL HIGH (ref 78.0–100.0)
MONO ABS: 1.2 10*3/uL — AB (ref 0.1–1.0)
MONOS PCT: 11 % (ref 3–12)
NEUTROS ABS: 4.6 10*3/uL (ref 1.7–7.7)
NEUTROS PCT: 43 % (ref 43–77)
Platelets: 356 10*3/uL (ref 150–400)
RBC: 3.19 MIL/uL — ABNORMAL LOW (ref 4.22–5.81)
RDW: 15.8 % — AB (ref 11.5–15.5)
WBC: 10.8 10*3/uL — ABNORMAL HIGH (ref 4.0–10.5)

## 2014-11-09 MED ORDER — CYANOCOBALAMIN 1000 MCG/ML IJ SOLN
INTRAMUSCULAR | Status: AC
  Filled 2014-11-09: qty 1

## 2014-11-09 MED ORDER — CYANOCOBALAMIN 1000 MCG/ML IJ SOLN
1000.0000 ug | Freq: Once | INTRAMUSCULAR | Status: AC
  Administered 2014-11-09: 1000 ug via INTRAMUSCULAR

## 2014-11-09 NOTE — Patient Instructions (Signed)
[place] at [facility] Discharge Instructions  RECOMMENDATIONS MADE BY THE CONSULTANT AND ANY TEST RESULTS WILL BE SENT TO YOUR REFERRING PHYSICIAN.  Exam and discussion today with Dr. [name]. B12 injection today, then monthly. Referral to dermatologist. Return in one month for office visit.  Thank you for choosing [place] at [facility] to provide your oncology and hematology care.  To afford each patient quality time with our provider, please arrive at least 15 minutes before your scheduled appointment time.    You need to re-schedule your appointment should you arrive 10 or more minutes late.  We strive to give you quality time with our providers, and arriving late affects you and other patients whose appointments are after yours.  Also, if you no show three or more times for appointments you may be dismissed from the clinic at the providers discretion.     Again, thank you for choosing [facility].  Our hope is that these requests will decrease the amount of time that you wait before being seen by our physicians.       _____________________________________________________________  Should you have questions after your visit to [facility], please contact our office at [phone] between the hours of 8:30 a.m. and 4:30 p.m.  Voicemails left after 4:30 p.m. will not be returned until the following business day.  For prescription refill requests, have your pharmacy contact our office.

## 2014-11-09 NOTE — Progress Notes (Signed)
Randall Reyes presents today for injection per the provider's orders.  B12 administration without incident; see MAR for injection details.  Patient tolerated procedure well and without incident.  No questions or complaints noted at this time.

## 2014-11-09 NOTE — Progress Notes (Signed)
Labs drawn

## 2014-11-09 NOTE — Progress Notes (Signed)
Randall Hampshire, MD [place] [address]  CURRENT THERAPY: Observation  INTERVAL HISTORY: COMMODORE BELLEW 79 y.o. male returns for followup of CLL. He has had several recent hospitalizations and we opted to discontinue his ibrutinib. He looks much improved today.  The patient is present today with his wife.  He has been falling due to walking too fast and ongoing problems with his foot. He notes that he is undergoing PT three times weekly and thinks it is helping.  He uses a cane.  He has had 3 doses of Vitamin B12 thus far, he will have one today.  His wife states that his appetite is pretty good. He has developed a rash on the left side of his face and neck. He states it is very itchy. He is not taking any new types of medication     CLL (chronic lymphocytic leukemia)   02/14/2005 Procedure Peripheral flow cytometry- CLL (without 11q or 17 p deletion) presenting with stage 0 disease   10/25/2008 Progression Increasing WBC and symptoms suggestive of progressive disease   11/09/2008 - 01/07/2009 Chemotherapy Rituxan, fludarabine, cytoxan x 3 cycles with the discontinuation of fludarabine and cytoxan due to severe pancytopenia requiring hospitalization.   01/10/2009 Adverse Reaction Severe pancytopenia and adverse side effects   02/01/2009 - 04/01/2009 Chemotherapy Rituxan every 21 days x 3 cycles   04/02/2009 Remission Stable disease.  Chemotherapy holiday started   06/22/2013 - 06/29/2013 Chemotherapy Imbruvica daily   06/29/2013 Adverse Reaction Large right posterior leg (popliteal) ecchymosis/violaceous rash.  Likely Imbruvica-induced   07/31/2013 - 10/12/2013 Chemotherapy Zydelig (idelalisib) 150 mg BID monotherapy   10/12/2013 Adverse Reaction Transaminitis, suspected to be secondary to [place].  Dose decreased.   10/12/2013 - 11/09/2013 Chemotherapy Zydelig (idelalisib) 150 mg daily.   11/09/2013 Adverse Reaction Worsening transaminitis.  Zydelig on HOLD   11/09/2013 Treatment Plan Change Hold Zydelig   12/28/2013 Adverse Reaction Herpes Zoster outbreak.  Partial response with Famvir x 1 week, switched to acyclovir.  Progression over 1-2 weeks with development of keratoconjunctivitis bilaterally.  VZ Immune Globulin 625 mg IM given on 01/13/14.   02/08/2014 Treatment Plan Change Ibrutinib restarted 140 mg daily for one week followed by 280 mg daily followed by 420 mg daily with prophylactic acyclovir.   02/08/2014 - 07/05/2014 Chemotherapy Ibrutinib 420 mg daily   [id number] - 04/17/2014 Hospital Admission Right lower lobe pneumonia   07/05/2014 Adverse Reaction Fissuring of fingertips and sore, red feet with transaminitis   07/05/2014 Treatment Plan Change Hold Ibrutinib   07/22/2014 - 08/19/2014 Chemotherapy Ibrutinib restarted at 280 mg daily.   08/09/2014 - 08/12/2014 Hospital Admission Bilateral pneumonia   08/19/2014 Adverse Reaction Nausea with vomiting.  Escorted to ED.   08/19/2014 Treatment Plan Change Ibrutinib on hold- ED escort from clinic       Past Medical History  Diagnosis Date  . Diabetes mellitus   . Leukemia 6.22.2012    PER PATIENT  . Hypertension   . Ulcer   . Anemia   . CLL (chronic lymphocytic leukemia) 01/24/2011  . Melanoma in situ 01/24/2011  . DM (diabetes mellitus) 01/24/2011  . Mild obesity 01/24/2011  . Eczema 01/24/2011  . Port catheter in place 05/02/2012  . Shingles   . History of pneumonia 04/2014    has CLL (chronic lymphocytic leukemia); Melanoma in situ; DM (diabetes mellitus); Mild obesity; Eczema; Port catheter in place; Pneumonia; Sepsis due to pneumonia; Leukocytosis; HCAP (healthcare-associated pneumonia); AKI (acute  kidney injury); Hyperkalemia; and Malnutrition of moderate degree on his problem list.     is allergic to aspirin and ibuprofen.  We administered cyanocobalamin.  Past Surgical History  Procedure Laterality Date  . Hernia repair  2001  . Bleeding ulcer    . Portacath placement  2008  . Cataract  extraction w/phaco  05/05/2012    Procedure: CATARACT EXTRACTION PHACO AND INTRAOCULAR LENS PLACEMENT (IOC);  Surgeon: Tonny Branch, MD;  Location: [facility];  Service: Ophthalmology;  Laterality: Right;  CDE:13.25  . Cataract extraction w/phaco  05/15/2012    Procedure: CATARACT EXTRACTION PHACO AND INTRAOCULAR LENS PLACEMENT (IOC);  Surgeon: Tonny Branch, MD;  Location: [facility];  Service: Ophthalmology;  Laterality: Left;  CDE:  12.32    Denies any headaches, dizziness, double vision, fevers, chills, night sweats, nausea, vomiting, diarrhea, constipation, chest pain, heart palpitations, blood in stool, black tarry stool, urinary pain, urinary burning, urinary frequency, hematuria. Positive for weight gain.  14 point review of systems was performed and is negative except as detailed under history of present illness and above    PHYSICAL EXAMINATION  ECOG PERFORMANCE STATUS: 1 - Symptomatic but completely ambulatory  Filed Vitals:   11/09/14 1100  BP: 104/74  Pulse: 97  Temp: 98.2 F (36.8 C)  Resp: 18    GENERAL:      He walks with a limp due to his left foot. SKIN: skin color, texture, turgor are normal, no rashes or significant lesions, fissures continue to improve HEAD: Normocephalic, No masses, lesions, tenderness or abnormalities EYES: normal, PERRLA, EOMI, Conjunctiva are pink and non-injected EARS: External ears normal OROPHARYNX:lips, buccal mucosa, and tongue normal and mucous membranes are moist  NECK: supple, trachea midline Diffuse erythematous raised rash on his neck and forehead LYMPH:  No appreciable palpable adenopathy in the neck, supraclavicular regions, axillary area or groin BREAST:not examined LUNGS: clear to auscultation  HEART: regular rate & rhythm, no murmurs, no gallops, S1 normal and S2 normal ABDOMEN:abdomen soft and normal bowel sounds BACK: Back symmetric, no curvature. NEURO: alert & oriented x 3 with fluent speech, no focal motor/sensory deficits, gait  normal (except small limp secondary to foot discomfort)   LABORATORY DATA: CBC    Component Value Date/Time   WBC 10.8* 11/09/2014 1245   RBC 3.19* 11/09/2014 1245   RBC 2.82* 05/21/2014 1422   HGB 10.3* 11/09/2014 1245   HCT 33.8* 11/09/2014 1245   PLT 356 11/09/2014 1245   MCV 106.0* 11/09/2014 1245   MCH 32.3 11/09/2014 1245   MCHC 30.5 11/09/2014 1245   RDW 15.8* 11/09/2014 1245   LYMPHSABS 4.4* 11/09/2014 1245   MONOABS 1.2* 11/09/2014 1245   EOSABS 0.5 11/09/2014 1245   BASOSABS 0.0 11/09/2014 1245      Chemistry      Component Value Date/Time   NA 136 11/09/2014 1245   K 5.3* 11/09/2014 1245   CL 107 11/09/2014 1245   CO2 22 11/09/2014 1245   BUN 25* 11/09/2014 1245   CREATININE 1.38* 11/09/2014 1245      Component Value Date/Time   CALCIUM 8.9 11/09/2014 1245   ALKPHOS 99 11/09/2014 1245   AST 30 11/09/2014 1245   ALT 20 11/09/2014 1245   BILITOT 0.4 11/09/2014 1245        ASSESSMENT AND PLAN:   CLL B12 deficiency Facial/Neck Rash  He was diagnosed in 2006 and per records was treated with FCR in 2010 secondary to a rapid doubling time of his wbc count up  to 290K with diffuse LAD, hypogammaglobulinemia. He only received 3 cycles of FCR secondary to prolonged pancytopenia.   He was started on Imbruvica in January of 2015 for a rising wbc. Other counts were relatively preserved. WBC count at that time was 36K. He was otherwise asymptomatic. Therapy was interrupted secondary to rash. He was then started on Idelalisib on 07/31/2013. This therapy was held secondary to zoster diagnosed on 12/28/2013. Patient was given Varicella Zoster Immune Globulin IM on 01/13/2014.  He was then restarted on Imbruvica on 02/08/2014. He was dose reduced secondary to abnormal LFT's and now therapy has been discontinued secondary to pneumonia and generalized decline. He is currently doing better.   Hemoglobin is improved today. He will continue forward with ongoing B-12 replacement.  Plan is for his final weekly injection today. He will then transition to once monthly. I would like to see him back again in 4 weeks for lab rechecked. We will begin to follow his counts closely given his history of rapid doubling time.  I have referred him to dermatology for further evaluation of his rash.  All questions were answered. The patient knows to call the clinic with any problems, questions or concerns. We can certainly see the patient much sooner if necessary. //   This note was electronically signed.  This document serves as a record of services personally performed by Ancil Linsey, MD. It was created on her behalf by Janace Hoard, a trained medical scribe. The creation of this record is based on the scribe's personal observations and the provider's statements to them. This document has been checked and approved by the attending provider.  I have reviewed the above documentation for accuracy and completeness, and I agree with the above.  Kelby Fam. [name], MD

## 2014-11-09 NOTE — Progress Notes (Unsigned)
Referral sent to Dr Hall/  appt 7/14 @330 

## 2014-11-09 NOTE — Progress Notes (Signed)
Please see doctors encounter for more information 

## 2014-11-12 ENCOUNTER — Encounter (HOSPITAL_COMMUNITY): Payer: Medicare Other

## 2014-11-19 ENCOUNTER — Encounter (HOSPITAL_COMMUNITY): Payer: Medicare Other

## 2014-11-22 ENCOUNTER — Ambulatory Visit (HOSPITAL_COMMUNITY): Payer: Medicare Other | Admitting: Oncology

## 2014-12-07 ENCOUNTER — Encounter (HOSPITAL_COMMUNITY): Payer: Medicare Other | Attending: Oncology

## 2014-12-07 ENCOUNTER — Encounter (HOSPITAL_COMMUNITY): Payer: Self-pay | Admitting: Oncology

## 2014-12-07 ENCOUNTER — Encounter (HOSPITAL_COMMUNITY): Payer: Medicare Other

## 2014-12-07 ENCOUNTER — Encounter (HOSPITAL_BASED_OUTPATIENT_CLINIC_OR_DEPARTMENT_OTHER): Payer: Medicare Other

## 2014-12-07 ENCOUNTER — Ambulatory Visit (HOSPITAL_COMMUNITY): Payer: Medicare Other | Admitting: Oncology

## 2014-12-07 ENCOUNTER — Encounter (HOSPITAL_BASED_OUTPATIENT_CLINIC_OR_DEPARTMENT_OTHER): Payer: Medicare Other | Admitting: Oncology

## 2014-12-07 VITALS — BP 105/62 | HR 88 | Temp 97.9°F | Resp 18 | Wt 126.0 lb

## 2014-12-07 DIAGNOSIS — C9110 Chronic lymphocytic leukemia of B-cell type not having achieved remission: Secondary | ICD-10-CM | POA: Insufficient documentation

## 2014-12-07 DIAGNOSIS — E538 Deficiency of other specified B group vitamins: Secondary | ICD-10-CM | POA: Insufficient documentation

## 2014-12-07 DIAGNOSIS — M79672 Pain in left foot: Secondary | ICD-10-CM

## 2014-12-07 DIAGNOSIS — M310 Hypersensitivity angiitis: Secondary | ICD-10-CM | POA: Diagnosis present

## 2014-12-07 DIAGNOSIS — D519 Vitamin B12 deficiency anemia, unspecified: Secondary | ICD-10-CM

## 2014-12-07 HISTORY — DX: Deficiency of other specified B group vitamins: E53.8

## 2014-12-07 LAB — COMPREHENSIVE METABOLIC PANEL
ALT: 15 U/L — AB (ref 17–63)
ANION GAP: 7 (ref 5–15)
AST: 25 U/L (ref 15–41)
Albumin: 2.9 g/dL — ABNORMAL LOW (ref 3.5–5.0)
Alkaline Phosphatase: 88 U/L (ref 38–126)
BUN: 26 mg/dL — ABNORMAL HIGH (ref 6–20)
CALCIUM: 8.8 mg/dL — AB (ref 8.9–10.3)
CO2: 24 mmol/L (ref 22–32)
CREATININE: 1.94 mg/dL — AB (ref 0.61–1.24)
Chloride: 106 mmol/L (ref 101–111)
GFR calc Af Amer: 37 mL/min — ABNORMAL LOW (ref >60)
GFR, EST NON AFRICAN AMERICAN: 32 mL/min — AB (ref >60)
Glucose, Bld: 98 mg/dL (ref 65–99)
POTASSIUM: 4.8 mmol/L (ref 3.5–5.1)
Sodium: 137 mmol/L (ref 135–145)
TOTAL PROTEIN: 8 g/dL (ref 6.5–8.1)
Total Bilirubin: 0.4 mg/dL (ref 0.3–1.2)

## 2014-12-07 LAB — CBC WITH DIFFERENTIAL/PLATELET
BASOS ABS: 0 10*3/uL (ref 0.0–0.1)
Basophils Relative: 0 % (ref 0–1)
EOS PCT: 10 % — AB (ref 0–5)
Eosinophils Absolute: 0.9 10*3/uL — ABNORMAL HIGH (ref 0.0–0.7)
HCT: 32.7 % — ABNORMAL LOW (ref 39.0–52.0)
HEMOGLOBIN: 10.2 g/dL — AB (ref 13.0–17.0)
Lymphocytes Relative: 45 % (ref 12–46)
Lymphs Abs: 4 10*3/uL (ref 0.7–4.0)
MCH: 31.9 pg (ref 26.0–34.0)
MCHC: 31.2 g/dL (ref 30.0–36.0)
MCV: 102.2 fL — ABNORMAL HIGH (ref 78.0–100.0)
MONO ABS: 1.3 10*3/uL — AB (ref 0.1–1.0)
Monocytes Relative: 14 % — ABNORMAL HIGH (ref 3–12)
Neutro Abs: 2.7 10*3/uL (ref 1.7–7.7)
Neutrophils Relative %: 31 % — ABNORMAL LOW (ref 43–77)
Platelets: 313 10*3/uL (ref 150–400)
RBC: 3.2 MIL/uL — ABNORMAL LOW (ref 4.22–5.81)
RDW: 16.1 % — ABNORMAL HIGH (ref 11.5–15.5)
WBC: 8.8 10*3/uL (ref 4.0–10.5)

## 2014-12-07 MED ORDER — CYANOCOBALAMIN 1000 MCG/ML IJ SOLN
1000.0000 ug | INTRAMUSCULAR | Status: DC
  Administered 2014-12-07: 1000 ug via INTRAMUSCULAR

## 2014-12-07 MED ORDER — CYANOCOBALAMIN 1000 MCG/ML IJ SOLN
INTRAMUSCULAR | Status: AC
  Filled 2014-12-07: qty 1

## 2014-12-07 MED ORDER — SODIUM CHLORIDE 0.9 % IJ SOLN
10.0000 mL | INTRAMUSCULAR | Status: DC | PRN
  Administered 2014-12-07: 10 mL via INTRAVENOUS
  Filled 2014-12-07: qty 10

## 2014-12-07 MED ORDER — HEPARIN SOD (PORK) LOCK FLUSH 100 UNIT/ML IV SOLN
INTRAVENOUS | Status: AC
  Filled 2014-12-07: qty 5

## 2014-12-07 MED ORDER — HEPARIN SOD (PORK) LOCK FLUSH 100 UNIT/ML IV SOLN
500.0000 [IU] | Freq: Once | INTRAVENOUS | Status: AC
Start: 2014-12-07 — End: 2014-12-07
  Administered 2014-12-07: 500 [IU] via INTRAVENOUS

## 2014-12-07 NOTE — Progress Notes (Signed)
Randall Hampshire, MD [place] [address]  CLL (chronic lymphocytic leukemia) - Plan: heparin lock flush 100 unit/mL, sodium chloride 0.9 % injection 10 mL  B12 deficiency  CURRENT THERAPY: Surveillance.  INTERVAL HISTORY: Randall Reyes 79 y.o. male returns for followup of CLL.    CLL (chronic lymphocytic leukemia)   02/14/2005 Procedure Peripheral flow cytometry- CLL (without 11q or 17 p deletion) presenting with stage 0 disease   10/25/2008 Progression Increasing WBC and symptoms suggestive of progressive disease   11/09/2008 - 01/07/2009 Chemotherapy Rituxan, fludarabine, cytoxan x 3 cycles with the discontinuation of fludarabine and cytoxan due to severe pancytopenia requiring hospitalization.   01/10/2009 Adverse Reaction Severe pancytopenia and adverse side effects   02/01/2009 - 04/01/2009 Chemotherapy Rituxan every 21 days x 3 cycles   04/02/2009 Remission Stable disease.  Chemotherapy holiday started   06/22/2013 - 06/29/2013 Chemotherapy Imbruvica daily   06/29/2013 Adverse Reaction Large right posterior leg (popliteal) ecchymosis/violaceous rash.  Likely Imbruvica-induced   07/31/2013 - 10/12/2013 Chemotherapy Zydelig (idelalisib) 150 mg BID monotherapy   10/12/2013 Adverse Reaction Transaminitis, suspected to be secondary to [place].  Dose decreased.   10/12/2013 - 11/09/2013 Chemotherapy Zydelig (idelalisib) 150 mg daily.   11/09/2013 Adverse Reaction Worsening transaminitis.  Zydelig on HOLD   11/09/2013 Treatment Plan Change Hold Zydelig   12/28/2013 Adverse Reaction Herpes Zoster outbreak.  Partial response with Famvir x 1 week, switched to acyclovir.  Progression over 1-2 weeks with development of keratoconjunctivitis bilaterally.  VZ Immune Globulin 625 mg IM given on 01/13/14.   02/08/2014 Treatment Plan Change Ibrutinib restarted 140 mg daily for one week followed by 280 mg daily followed by 420 mg daily with prophylactic acyclovir.   02/08/2014 -  07/05/2014 Chemotherapy Ibrutinib 420 mg daily   [id number] - 04/17/2014 Hospital Admission Right lower lobe pneumonia   07/05/2014 Adverse Reaction Fissuring of fingertips and sore, red feet with transaminitis   07/05/2014 Treatment Plan Change Hold Ibrutinib   07/22/2014 - 08/19/2014 Chemotherapy Ibrutinib restarted at 280 mg daily.   08/09/2014 - 08/12/2014 Hospital Admission Bilateral pneumonia   08/19/2014 Adverse Reaction Nausea with vomiting.  Escorted to ED.   08/19/2014 Treatment Plan Change Ibrutinib on hold- ED escort from clinic    10/18/2014 Miscellaneous B12 weekly x 4 then monthly.     I personally reviewed and went over laboratory results with the patient.  The results are noted within this dictation.  His biggest complaint is his left foot pain.  This is a chronic issue and has been worked-up by his primary care provider and podiatrist.  He is using a cane to help with ambulation PRN for balance.    He is finishing with [facility] program and has 3 more appointments.  He is back to driving.  He denies any B symptoms.  Past Medical History  Diagnosis Date  . Diabetes mellitus   . Leukemia 6.22.2012    PER PATIENT  . Hypertension   . Ulcer   . Anemia   . CLL (chronic lymphocytic leukemia) 01/24/2011  . Melanoma in situ 01/24/2011  . DM (diabetes mellitus) 01/24/2011  . Mild obesity 01/24/2011  . Eczema 01/24/2011  . Port catheter in place 05/02/2012  . Shingles   . History of pneumonia 04/2014  . B12 deficiency 12/07/2014    has CLL (chronic lymphocytic leukemia); Melanoma in situ; DM (diabetes mellitus); Mild obesity; Eczema; Port catheter in place; Pneumonia; Sepsis due to pneumonia; Leukocytosis; HCAP (healthcare-associated  pneumonia); AKI (acute kidney injury); Hyperkalemia; Malnutrition of moderate degree; and B12 deficiency on his problem list.     is allergic to aspirin and ibuprofen.  We administered heparin lock flush, sodium chloride, and cyanocobalamin.  Past Surgical  History  Procedure Laterality Date  . Hernia repair  2001  . Bleeding ulcer    . Portacath placement  2008  . Cataract extraction w/phaco  05/05/2012    Procedure: CATARACT EXTRACTION PHACO AND INTRAOCULAR LENS PLACEMENT (IOC);  Surgeon: Tonny Branch, MD;  Location: [facility];  Service: Ophthalmology;  Laterality: Right;  CDE:13.25  . Cataract extraction w/phaco  05/15/2012    Procedure: CATARACT EXTRACTION PHACO AND INTRAOCULAR LENS PLACEMENT (IOC);  Surgeon: Tonny Branch, MD;  Location: [facility];  Service: Ophthalmology;  Laterality: Left;  CDE:  12.32    Denies any headaches, dizziness, double vision, fevers, chills, night sweats, nausea, vomiting, diarrhea, constipation, chest pain, heart palpitations, shortness of breath, blood in stool, black tarry stool, urinary pain, urinary burning, urinary frequency, hematuria.   PHYSICAL EXAMINATION  ECOG PERFORMANCE STATUS: 1  Filed Vitals:   12/07/14 0921  BP: 105/62  Pulse: 88  Temp: 97.9 F (36.6 C)  Resp: 18    GENERAL:alert, no distress, well nourished, well developed, comfortable, cooperative and smiling SKIN: skin color, texture, turgor are normal, no rashes or significant lesions HEAD: Normocephalic, No masses, lesions, tenderness or abnormalities EYES: normal, PERRLA, EOMI, Conjunctiva are pink and non-injected EARS: External ears normal OROPHARYNX:lips, buccal mucosa, and tongue normal and mucous membranes are moist  NECK: supple, no adenopathy, thyroid normal size, non-tender, without nodularity, no stridor, non-tender, trachea midline LYMPH:  no palpable lymphadenopathy BREAST:not examined LUNGS: clear to auscultation and percussion HEART: regular rate & rhythm, no murmurs, no gallops, S1 normal and S2 normal ABDOMEN:abdomen soft, non-tender, normal bowel sounds and no masses or organomegaly BACK: Back symmetric, no curvature. EXTREMITIES:less then 2 second capillary refill, no joint deformities, effusion, or inflammation, no skin  discoloration, no cyanosis.  Negative Homan's Sign on left. NEURO: alert & oriented x 3 with fluent speech, no focal motor/sensory deficits, gait normal   LABORATORY DATA: CBC    Component Value Date/Time   WBC 10.8* 11/09/2014 1245   RBC 3.19* 11/09/2014 1245   RBC 2.82* 05/21/2014 1422   HGB 10.3* 11/09/2014 1245   HCT 33.8* 11/09/2014 1245   PLT 356 11/09/2014 1245   MCV 106.0* 11/09/2014 1245   MCH 32.3 11/09/2014 1245   MCHC 30.5 11/09/2014 1245   RDW 15.8* 11/09/2014 1245   LYMPHSABS 4.4* 11/09/2014 1245   MONOABS 1.2* 11/09/2014 1245   EOSABS 0.5 11/09/2014 1245   BASOSABS 0.0 11/09/2014 1245      Chemistry      Component Value Date/Time   NA 136 11/09/2014 1245   K 5.3* 11/09/2014 1245   CL 107 11/09/2014 1245   CO2 22 11/09/2014 1245   BUN 25* 11/09/2014 1245   CREATININE 1.38* 11/09/2014 1245      Component Value Date/Time   CALCIUM 8.9 11/09/2014 1245   ALKPHOS 99 11/09/2014 1245   AST 30 11/09/2014 1245   ALT 20 11/09/2014 1245   BILITOT 0.4 11/09/2014 1245       RADIOGRAPHIC STUDIES:  No results found.   ASSESSMENT AND PLAN:  CLL (chronic lymphocytic leukemia) CLL, diagnosed in 2006 and per records was treated with FCR in 2010 secondary to a rapid doubling time of his wbc count up to 290K with diffuse LAD, hypogammaglobulinemia. He only received  3 cycles of FCR secondary to prolonged pancytopenia.  In Jan 2015, he was started on Imbruvica for a rising wbc and increased fatigue; other counts were relatively preserved. WBC count at that time was 36K. Therapy was interrupted secondary to rash. He was then started on Idelalisib on 07/31/2013. This therapy was held secondary to severe zoster outbreak diagnosed on 12/28/2013. Patient was given Varicella Zoster Immune Globulin IM on 01/13/2014.  He was then restarted on Imbruvica on 02/08/2014. He was dose reduced secondary to abnormal LFT's and now therapy has been discontinued secondary to pneumonia and  generalized decline. He is currently doing better off therapy.  Labs today: CBC diff, CMET, Homocysteine level, and MMA.  Labs in 4 and 8 weeks: CBC diff, CMET  B12 supportive therapy plan built  Return in 8 weeks for follow-up.    THERAPY PLAN:  Hold therapy at this time.  All questions were answered. The patient knows to call the clinic with any problems, questions or concerns. We can certainly see the patient much sooner if necessary.  Patient and plan discussed with Dr. Ancil Linsey and she is in agreement with the aforementioned.   This note is electronically signed by: Robynn Pane 12/07/2014 10:06 AM

## 2014-12-07 NOTE — Progress Notes (Signed)
LABS DRAWN

## 2014-12-07 NOTE — Assessment & Plan Note (Addendum)
CLL, diagnosed in 2006 and per records was treated with FCR in 2010 secondary to a rapid doubling time of his wbc count up to 290K with diffuse LAD, hypogammaglobulinemia. He only received 3 cycles of FCR secondary to prolonged pancytopenia.  In Jan 2015, he was started on Imbruvica for a rising wbc and increased fatigue; other counts were relatively preserved. WBC count at that time was 36K. Therapy was interrupted secondary to rash. He was then started on Idelalisib on 07/31/2013. This therapy was held secondary to severe zoster outbreak diagnosed on 12/28/2013. Patient was given Varicella Zoster Immune Globulin IM on 01/13/2014.  He was then restarted on Imbruvica on 02/08/2014. He was dose reduced secondary to abnormal LFT's and now therapy has been discontinued secondary to pneumonia and generalized decline. He is currently doing better off therapy.  Labs today: CBC diff, CMET, Homocysteine level, and MMA.  Labs in 4 and 8 weeks: CBC diff, CMET  B12 supportive therapy plan built  Return in 8 weeks for follow-up.

## 2014-12-07 NOTE — Progress Notes (Signed)
Please see doctors encounter for more information 

## 2014-12-07 NOTE — Patient Instructions (Signed)
..  [place] at [facility] Discharge Instructions  RECOMMENDATIONS MADE BY THE CONSULTANT AND ANY TEST RESULTS WILL BE SENT TO YOUR REFERRING PHYSICIAN.  B12 monthly Labs monthly return in 8 weeks for f/u  Thank you for choosing [place] at [facility] to provide your oncology and hematology care.  To afford each patient quality time with our provider, please arrive at least 15 minutes before your scheduled appointment time.    You need to re-schedule your appointment should you arrive 10 or more minutes late.  We strive to give you quality time with our providers, and arriving late affects you and other patients whose appointments are after yours.  Also, if you no show three or more times for appointments you may be dismissed from the clinic at the providers discretion.     Again, thank you for choosing [facility].  Our hope is that these requests will decrease the amount of time that you wait before being seen by our physicians.       _____________________________________________________________  Should you have questions after your visit to [facility], please contact our office at [phone] between the hours of 8:30 a.m. and 4:30 p.m.  Voicemails left after 4:30 p.m. will not be returned until the following business day.  For prescription refill requests, have your pharmacy contact our office.

## 2014-12-07 NOTE — Progress Notes (Signed)
..  Randall Reyes presented for Portacath access and flush.  Portacath located rt chest wall accessed with  H 20 needle.  Good blood return present. Portacath flushed with 2ml NS and 500U/29ml Heparin and needle removed intact.  Procedure tolerated well and without incident.    Randall Reyes presents today for injection per the provider's orders.  Vitamin b12 administration without incident; see MAR for injection details.  Patient tolerated procedure well and without incident.  No questions or complaints noted at this time.

## 2014-12-08 LAB — HOMOCYSTEINE: HOMOCYSTEINE-NORM: 16.8 umol/L — AB (ref 0.0–15.0)

## 2014-12-09 LAB — METHYLMALONIC ACID, SERUM: METHYLMALONIC ACID, QUANTITATIVE: 399 nmol/L — AB (ref 0–378)

## 2014-12-31 ENCOUNTER — Encounter (HOSPITAL_COMMUNITY): Payer: Medicare Other

## 2015-01-04 ENCOUNTER — Encounter (HOSPITAL_COMMUNITY): Payer: Medicare Other | Attending: Hematology & Oncology

## 2015-01-04 VITALS — BP 106/75 | HR 71 | Temp 97.8°F | Resp 16

## 2015-01-04 DIAGNOSIS — D519 Vitamin B12 deficiency anemia, unspecified: Secondary | ICD-10-CM | POA: Diagnosis not present

## 2015-01-04 DIAGNOSIS — C9110 Chronic lymphocytic leukemia of B-cell type not having achieved remission: Secondary | ICD-10-CM | POA: Insufficient documentation

## 2015-01-04 DIAGNOSIS — E538 Deficiency of other specified B group vitamins: Secondary | ICD-10-CM

## 2015-01-04 LAB — COMPREHENSIVE METABOLIC PANEL
ALT: 16 U/L — AB (ref 17–63)
AST: 25 U/L (ref 15–41)
Albumin: 3.6 g/dL (ref 3.5–5.0)
Alkaline Phosphatase: 98 U/L (ref 38–126)
Anion gap: 5 (ref 5–15)
BUN: 25 mg/dL — ABNORMAL HIGH (ref 6–20)
CHLORIDE: 110 mmol/L (ref 101–111)
CO2: 20 mmol/L — ABNORMAL LOW (ref 22–32)
Calcium: 9.1 mg/dL (ref 8.9–10.3)
Creatinine, Ser: 1.85 mg/dL — ABNORMAL HIGH (ref 0.61–1.24)
GFR calc non Af Amer: 33 mL/min — ABNORMAL LOW (ref >60)
GFR, EST AFRICAN AMERICAN: 39 mL/min — AB (ref >60)
Glucose, Bld: 103 mg/dL — ABNORMAL HIGH (ref 65–99)
POTASSIUM: 5.7 mmol/L — AB (ref 3.5–5.1)
Sodium: 135 mmol/L (ref 135–145)
Total Bilirubin: 0.4 mg/dL (ref 0.3–1.2)
Total Protein: 9.1 g/dL — ABNORMAL HIGH (ref 6.5–8.1)

## 2015-01-04 LAB — CBC WITH DIFFERENTIAL/PLATELET
BASOS ABS: 0 10*3/uL (ref 0.0–0.1)
Basophils Relative: 0 % (ref 0–1)
EOS ABS: 0.6 10*3/uL (ref 0.0–0.7)
EOS PCT: 6 % — AB (ref 0–5)
HCT: 36.5 % — ABNORMAL LOW (ref 39.0–52.0)
Hemoglobin: 11.8 g/dL — ABNORMAL LOW (ref 13.0–17.0)
LYMPHS ABS: 4.6 10*3/uL — AB (ref 0.7–4.0)
Lymphocytes Relative: 45 % (ref 12–46)
MCH: 33 pg (ref 26.0–34.0)
MCHC: 32.3 g/dL (ref 30.0–36.0)
MCV: 102 fL — ABNORMAL HIGH (ref 78.0–100.0)
Monocytes Absolute: 1.2 10*3/uL — ABNORMAL HIGH (ref 0.1–1.0)
Monocytes Relative: 12 % (ref 3–12)
Neutro Abs: 3.7 10*3/uL (ref 1.7–7.7)
Neutrophils Relative %: 36 % — ABNORMAL LOW (ref 43–77)
PLATELETS: 213 10*3/uL (ref 150–400)
RBC: 3.58 MIL/uL — ABNORMAL LOW (ref 4.22–5.81)
RDW: 16.3 % — AB (ref 11.5–15.5)
WBC: 10.3 10*3/uL (ref 4.0–10.5)

## 2015-01-04 MED ORDER — CYANOCOBALAMIN 1000 MCG/ML IJ SOLN
1000.0000 ug | Freq: Once | INTRAMUSCULAR | Status: AC
  Administered 2015-01-04: 1000 ug via INTRAMUSCULAR
  Filled 2015-01-04: qty 1

## 2015-01-04 MED ORDER — SODIUM CHLORIDE 0.9 % IJ SOLN
10.0000 mL | INTRAMUSCULAR | Status: DC | PRN
  Administered 2015-01-04: 10 mL via INTRAVENOUS
  Filled 2015-01-04: qty 10

## 2015-01-04 MED ORDER — HEPARIN SOD (PORK) LOCK FLUSH 100 UNIT/ML IV SOLN
500.0000 [IU] | Freq: Once | INTRAVENOUS | Status: AC
  Administered 2015-01-04: 500 [IU] via INTRAVENOUS
  Filled 2015-01-04: qty 5

## 2015-01-04 NOTE — Progress Notes (Signed)
Please see other encounter for today for care rendered.

## 2015-01-04 NOTE — Progress Notes (Signed)
Randall Reyes presented for Portacath access and flush. Proper placement of portacath confirmed by CXR. Portacath located right chest wall accessed with  H 20 needle. No blood return and flushes well without difficulty or discomfort. Portacath flushed with 22ml NS and 500U/68ml Heparin and needle removed intact. Procedure without incident. Patient tolerated procedure well.  Randall Reyes presents today for injection per MD orders. B12 1065mcg administered IM in left Upper Arm. Administration without incident. Patient tolerated well.  Randall Reyes presented for labwork. Labs per MD order drawn via Peripheral Line 25 gauge needle inserted in RAC. Good blood return present. Procedure without incident.  Needle removed intact. Patient tolerated procedure well.

## 2015-02-01 ENCOUNTER — Encounter (HOSPITAL_COMMUNITY): Payer: Self-pay | Admitting: Emergency Medicine

## 2015-02-01 ENCOUNTER — Other Ambulatory Visit: Payer: Self-pay

## 2015-02-01 ENCOUNTER — Encounter (HOSPITAL_BASED_OUTPATIENT_CLINIC_OR_DEPARTMENT_OTHER): Payer: Medicare Other | Admitting: Oncology

## 2015-02-01 ENCOUNTER — Encounter (HOSPITAL_COMMUNITY): Payer: Medicare Other

## 2015-02-01 ENCOUNTER — Telehealth (HOSPITAL_COMMUNITY): Payer: Self-pay

## 2015-02-01 ENCOUNTER — Encounter (HOSPITAL_COMMUNITY): Payer: Self-pay | Admitting: Oncology

## 2015-02-01 ENCOUNTER — Observation Stay (HOSPITAL_COMMUNITY)
Admission: EM | Admit: 2015-02-01 | Discharge: 2015-02-03 | Disposition: A | Discharge status: Home / Self Care | Payer: Medicare Other | Attending: Family Medicine | Admitting: Family Medicine

## 2015-02-01 ENCOUNTER — Encounter (HOSPITAL_COMMUNITY): Payer: Medicare Other | Attending: Oncology

## 2015-02-01 VITALS — BP 132/71 | HR 64 | Temp 97.8°F | Resp 18 | Wt 124.0 lb

## 2015-02-01 DIAGNOSIS — Z95828 Presence of other vascular implants and grafts: Secondary | ICD-10-CM

## 2015-02-01 DIAGNOSIS — I10 Essential (primary) hypertension: Secondary | ICD-10-CM | POA: Diagnosis present

## 2015-02-01 DIAGNOSIS — Z9889 Other specified postprocedural states: Secondary | ICD-10-CM

## 2015-02-01 DIAGNOSIS — C9110 Chronic lymphocytic leukemia of B-cell type not having achieved remission: Secondary | ICD-10-CM | POA: Diagnosis present

## 2015-02-01 DIAGNOSIS — E119 Type 2 diabetes mellitus without complications: Secondary | ICD-10-CM

## 2015-02-01 DIAGNOSIS — N170 Acute kidney failure with tubular necrosis: Secondary | ICD-10-CM

## 2015-02-01 DIAGNOSIS — E538 Deficiency of other specified B group vitamins: Secondary | ICD-10-CM

## 2015-02-01 DIAGNOSIS — Z8701 Personal history of pneumonia (recurrent): Secondary | ICD-10-CM | POA: Diagnosis not present

## 2015-02-01 DIAGNOSIS — D649 Anemia, unspecified: Secondary | ICD-10-CM | POA: Diagnosis not present

## 2015-02-01 DIAGNOSIS — Z87891 Personal history of nicotine dependence: Secondary | ICD-10-CM | POA: Diagnosis not present

## 2015-02-01 DIAGNOSIS — E875 Hyperkalemia: Secondary | ICD-10-CM | POA: Diagnosis not present

## 2015-02-01 DIAGNOSIS — D519 Vitamin B12 deficiency anemia, unspecified: Secondary | ICD-10-CM

## 2015-02-01 DIAGNOSIS — E669 Obesity, unspecified: Secondary | ICD-10-CM | POA: Diagnosis not present

## 2015-02-01 DIAGNOSIS — M310 Hypersensitivity angiitis: Secondary | ICD-10-CM | POA: Diagnosis present

## 2015-02-01 DIAGNOSIS — Z872 Personal history of diseases of the skin and subcutaneous tissue: Secondary | ICD-10-CM | POA: Insufficient documentation

## 2015-02-01 DIAGNOSIS — Z856 Personal history of leukemia: Secondary | ICD-10-CM | POA: Insufficient documentation

## 2015-02-01 DIAGNOSIS — Z79899 Other long term (current) drug therapy: Secondary | ICD-10-CM | POA: Insufficient documentation

## 2015-02-01 DIAGNOSIS — Z8619 Personal history of other infectious and parasitic diseases: Secondary | ICD-10-CM | POA: Insufficient documentation

## 2015-02-01 DIAGNOSIS — R7989 Other specified abnormal findings of blood chemistry: Secondary | ICD-10-CM | POA: Diagnosis present

## 2015-02-01 DIAGNOSIS — N179 Acute kidney failure, unspecified: Secondary | ICD-10-CM | POA: Diagnosis not present

## 2015-02-01 LAB — CBC WITH DIFFERENTIAL/PLATELET
BASOS PCT: 0 %
Basophils Absolute: 0 10*3/uL (ref 0.0–0.1)
EOS PCT: 5 %
Eosinophils Absolute: 0.5 10*3/uL (ref 0.0–0.7)
HEMATOCRIT: 37.3 % — AB (ref 39.0–52.0)
HEMOGLOBIN: 11.8 g/dL — AB (ref 13.0–17.0)
LYMPHS PCT: 57 %
Lymphs Abs: 5.4 10*3/uL — ABNORMAL HIGH (ref 0.7–4.0)
MCH: 32.6 pg (ref 26.0–34.0)
MCHC: 31.6 g/dL (ref 30.0–36.0)
MCV: 103 fL — AB (ref 78.0–100.0)
MONOS PCT: 14 %
Monocytes Absolute: 1.3 10*3/uL — ABNORMAL HIGH (ref 0.1–1.0)
NEUTROS ABS: 2.3 10*3/uL (ref 1.7–7.7)
NEUTROS PCT: 24 %
Platelets: 195 10*3/uL (ref 150–400)
RBC: 3.62 MIL/uL — ABNORMAL LOW (ref 4.22–5.81)
RDW: 15.9 % — ABNORMAL HIGH (ref 11.5–15.5)
WBC: 9.5 10*3/uL (ref 4.0–10.5)

## 2015-02-01 LAB — BASIC METABOLIC PANEL WITH GFR
Anion gap: 5 (ref 5–15)
BUN: 19 mg/dL (ref 6–20)
CO2: 21 mmol/L — ABNORMAL LOW (ref 22–32)
Calcium: 8.8 mg/dL — ABNORMAL LOW (ref 8.9–10.3)
Chloride: 111 mmol/L (ref 101–111)
Creatinine, Ser: 1.99 mg/dL — ABNORMAL HIGH (ref 0.61–1.24)
GFR calc Af Amer: 36 mL/min — ABNORMAL LOW
GFR calc non Af Amer: 31 mL/min — ABNORMAL LOW
Glucose, Bld: 126 mg/dL — ABNORMAL HIGH (ref 65–99)
Potassium: 6.1 mmol/L (ref 3.5–5.1)
Sodium: 137 mmol/L (ref 135–145)

## 2015-02-01 LAB — COMPREHENSIVE METABOLIC PANEL
ALK PHOS: 83 U/L (ref 38–126)
ALT: 16 U/L — AB (ref 17–63)
AST: 28 U/L (ref 15–41)
Albumin: 3.7 g/dL (ref 3.5–5.0)
Anion gap: 4 — ABNORMAL LOW (ref 5–15)
BILIRUBIN TOTAL: 0.4 mg/dL (ref 0.3–1.2)
BUN: 17 mg/dL (ref 6–20)
CALCIUM: 8.9 mg/dL (ref 8.9–10.3)
CO2: 20 mmol/L — AB (ref 22–32)
CREATININE: 1.7 mg/dL — AB (ref 0.61–1.24)
Chloride: 112 mmol/L — ABNORMAL HIGH (ref 101–111)
GFR, EST AFRICAN AMERICAN: 43 mL/min — AB (ref >60)
GFR, EST NON AFRICAN AMERICAN: 37 mL/min — AB (ref >60)
Glucose, Bld: 89 mg/dL (ref 65–99)
Potassium: 6.6 mmol/L (ref 3.5–5.1)
SODIUM: 136 mmol/L (ref 135–145)
TOTAL PROTEIN: 8.8 g/dL — AB (ref 6.5–8.1)

## 2015-02-01 LAB — GLUCOSE, CAPILLARY: Glucose-Capillary: 82 mg/dL (ref 65–99)

## 2015-02-01 LAB — VITAMIN B12: Vitamin B-12: >7500 pg/mL — ABNORMAL HIGH (ref 180–914)

## 2015-02-01 MED ORDER — SODIUM CHLORIDE 0.9 % IJ SOLN
10.0000 mL | INTRAMUSCULAR | Status: DC | PRN
  Administered 2015-02-01: 10 mL via INTRAVENOUS
  Filled 2015-02-01: qty 10

## 2015-02-01 MED ORDER — HEPARIN SODIUM (PORCINE) 5000 UNIT/ML IJ SOLN
5000.0000 [IU] | Freq: Three times a day (TID) | INTRAMUSCULAR | Status: DC
  Administered 2015-02-01 – 2015-02-03 (×5): 5000 [IU] via SUBCUTANEOUS
  Filled 2015-02-01 (×5): qty 1

## 2015-02-01 MED ORDER — SODIUM CHLORIDE 0.9 % IV SOLN
1.0000 g | Freq: Once | INTRAVENOUS | Status: AC
  Administered 2015-02-01: 1 g via INTRAVENOUS
  Filled 2015-02-01: qty 10

## 2015-02-01 MED ORDER — HEPARIN SOD (PORK) LOCK FLUSH 100 UNIT/ML IV SOLN
INTRAVENOUS | Status: AC
  Filled 2015-02-01: qty 5

## 2015-02-01 MED ORDER — INSULIN ASPART 100 UNIT/ML ~~LOC~~ SOLN
8.0000 [IU] | Freq: Once | SUBCUTANEOUS | Status: AC
  Administered 2015-02-01: 8 [IU] via SUBCUTANEOUS
  Filled 2015-02-01: qty 1

## 2015-02-01 MED ORDER — DEXTROSE 50 % IV SOLN
INTRAVENOUS | Status: AC
  Administered 2015-02-01: 50 mL via INTRAVENOUS
  Filled 2015-02-01: qty 50

## 2015-02-01 MED ORDER — PANTOPRAZOLE SODIUM 20 MG PO TBEC
20.0000 mg | DELAYED_RELEASE_TABLET | Freq: Every day | ORAL | Status: DC
  Filled 2015-02-01: qty 1

## 2015-02-01 MED ORDER — SODIUM CHLORIDE 0.9 % IV SOLN
INTRAVENOUS | Status: DC
  Administered 2015-02-01 – 2015-02-03 (×3): via INTRAVENOUS

## 2015-02-01 MED ORDER — FAMOTIDINE 20 MG PO TABS
20.0000 mg | ORAL_TABLET | Freq: Every day | ORAL | Status: DC
  Administered 2015-02-02 – 2015-02-03 (×2): 20 mg via ORAL
  Filled 2015-02-01 (×2): qty 1

## 2015-02-01 MED ORDER — ROSUVASTATIN CALCIUM 20 MG PO TABS
20.0000 mg | ORAL_TABLET | Freq: Every day | ORAL | Status: DC
  Administered 2015-02-02 – 2015-02-03 (×2): 20 mg via ORAL
  Filled 2015-02-01 (×2): qty 1

## 2015-02-01 MED ORDER — SODIUM CHLORIDE 0.9 % IJ SOLN: 10.0000 mL | INTRAMUSCULAR | Status: DC | PRN

## 2015-02-01 MED ORDER — HEPARIN SOD (PORK) LOCK FLUSH 100 UNIT/ML IV SOLN
500.0000 [IU] | Freq: Once | INTRAVENOUS | Status: DC
  Administered 2015-02-01: 500 [IU] via INTRAVENOUS

## 2015-02-01 MED ORDER — ACETAMINOPHEN 500 MG PO TABS
500.0000 mg | ORAL_TABLET | Freq: Every day | ORAL | Status: DC
  Administered 2015-02-01 – 2015-02-02 (×2): 500 mg via ORAL
  Filled 2015-02-01 (×2): qty 1

## 2015-02-01 MED ORDER — INSULIN ASPART 100 UNIT/ML ~~LOC~~ SOLN: 0.0000 [IU] | Freq: Every day | SUBCUTANEOUS | Status: DC

## 2015-02-01 MED ORDER — SODIUM POLYSTYRENE SULFONATE 15 GM/60ML PO SUSP
ORAL | Status: AC
  Filled 2015-02-01: qty 60

## 2015-02-01 MED ORDER — DEXTROSE 50 % IV SOLN
1.0000 | Freq: Once | INTRAVENOUS | Status: AC
  Administered 2015-02-01: 50 mL via INTRAVENOUS

## 2015-02-01 MED ORDER — SODIUM POLYSTYRENE SULFONATE 15 GM/60ML PO SUSP
15.0000 g | Freq: Once | ORAL | Status: AC
  Administered 2015-02-01: 15 g via ORAL

## 2015-02-01 MED ORDER — CYANOCOBALAMIN 1000 MCG/ML IJ SOLN
INTRAMUSCULAR | Status: AC
  Filled 2015-02-01: qty 1

## 2015-02-01 MED ORDER — INSULIN ASPART 100 UNIT/ML ~~LOC~~ SOLN: 0.0000 [IU] | Freq: Three times a day (TID) | SUBCUTANEOUS | Status: DC

## 2015-02-01 MED ORDER — SODIUM CHLORIDE 0.9 % IJ SOLN: 3.0000 mL | Freq: Two times a day (BID) | INTRAMUSCULAR | Status: DC

## 2015-02-01 MED ORDER — SODIUM CHLORIDE 0.9 % IV BOLUS (SEPSIS)
1000.0000 mL | Freq: Once | INTRAVENOUS | Status: AC
  Administered 2015-02-01: 1000 mL via INTRAVENOUS

## 2015-02-01 MED ORDER — CYANOCOBALAMIN 1000 MCG/ML IJ SOLN
1000.0000 ug | Freq: Once | INTRAMUSCULAR | Status: AC
  Administered 2015-02-01: 1000 ug via INTRAMUSCULAR

## 2015-02-01 NOTE — Assessment & Plan Note (Addendum)
CLL, diagnosed in 2006 and per records was treated with FCR in 2010 secondary to a rapid doubling time of his wbc count up to 290K with diffuse LAD, hypogammaglobulinemia. He only received 3 cycles of FCR secondary to prolonged pancytopenia.  In Jan 2015, he was started on Imbruvica for a rising wbc and increased fatigue; other counts were relatively preserved. WBC count at that time was 36K. Therapy was interrupted secondary to rash. He was then started on Idelalisib on 07/31/2013. This therapy was held secondary to severe zoster outbreak diagnosed on 12/28/2013. Patient was given Varicella Zoster Immune Globulin IM on 01/13/2014. He was then restarted on Imbruvica on 02/08/2014. He was dose reduced secondary to abnormal LFT's and now therapy has been discontinued secondary to pneumonia and generalized decline. He is currently doing better off therapy.  Labs today: CBC diff, CMET  Labs in 4 and 8 weeks: CBC diff, CMET  Labs in 8 weeks: B12.  B12 injection today.  Supportive therapy plan reviewed.  Return in 8 weeks for follow-up.  Addendum: Critical lab called to the clinic with a potassium of 6.6.  I personally called the patient at home and advised him to report to the ED.  He was at home when I called him.  He reports that he will come to the ED.  I discussed the case with ED physician, Dr. Tanna Furry.

## 2015-02-01 NOTE — ED Notes (Signed)
MD at bedside. 

## 2015-02-01 NOTE — Progress Notes (Signed)
VENICE LIZ presents today for injection per the provider's orders.  B12 administration without incident; see MAR for injection details.  Patient tolerated procedure well and without incident.  No questions or complaints noted at this time.  [name] presented for Portacath access and flush.  Portacath located right chest wall accessed with  H 20 needle.  No blood return - flushes w/o difficulty, no c/o pain at site. Portacath flushed with 68ml NS and 500U/54ml Heparin and needle removed intact.  Procedure tolerated well and without incident.  [name] presented for labwork.  Labs per MD order drawn via Peripheral Line 23 gauge needle inserted in right AC. Good blood return present.  Procedure without incident. Needle removed intact.  Patient tolerated procedure well.

## 2015-02-01 NOTE — Patient Instructions (Signed)
[place] at [facility] Discharge Instructions  RECOMMENDATIONS MADE BY THE CONSULTANT AND ANY TEST RESULTS WILL BE SENT TO YOUR REFERRING PHYSICIAN.  Exam and discussion with Kirby Crigler, PA. Lab work today. Port flush today and as scheduled. B12 injection today and monthly. Return for office visit in 8 weeks.   Thank you for choosing [place] at [facility] to provide your oncology and hematology care.  To afford each patient quality time with our provider, please arrive at least 15 minutes before your scheduled appointment time.    You need to re-schedule your appointment should you arrive 10 or more minutes late.  We strive to give you quality time with our providers, and arriving late affects you and other patients whose appointments are after yours.  Also, if you no show three or more times for appointments you may be dismissed from the clinic at the providers discretion.     Again, thank you for choosing [facility]  Our hope is that these requests will decrease the amount of time that you wait before being seen by our physicians.       _____________________________________________________________  Should you have questions after your visit to [facility], please contact our office at [phone] between the hours of 8:30 a.m. and 4:30 p.m.  Voicemails left after 4:30 p.m. will not be returned until the following business day.  For prescription refill requests, have your pharmacy contact our office.

## 2015-02-01 NOTE — Addendum Note (Signed)
Addended by: Joie Bimler on: 02/01/2015 03:29 PM   Modules accepted: Orders

## 2015-02-01 NOTE — Patient Instructions (Signed)
[facility] at [facility] Discharge Instructions  RECOMMENDATIONS MADE BY THE CONSULTANT AND ANY TEST RESULTS WILL BE SENT TO YOUR REFERRING PHYSICIAN.    Thank you for choosing [facility] at [facility] to provide your oncology and hematology care.  To afford each patient quality time with our provider, please arrive at least 15 minutes before your scheduled appointment time.    You need to re-schedule your appointment should you arrive 10 or more minutes late.  We strive to give you quality time with our providers, and arriving late affects you and other patients whose appointments are after yours.  Also, if you no show three or more times for appointments you may be dismissed from the clinic at the providers discretion.     Again, thank you for choosing [facility].  Our hope is that these requests will decrease the amount of time that you wait before being seen by our physicians.       _____________________________________________________________  Should you have questions after your visit to [facility], please contact our office at [phone] between the hours of 8:30 a.m. and 4:30 p.m.  Voicemails left after 4:30 p.m. will not be returned until the following business day.  For prescription refill requests, have your pharmacy contact our office.    

## 2015-02-01 NOTE — ED Notes (Signed)
Pt assisted to bedside commode for a bowel movement.

## 2015-02-01 NOTE — ED Notes (Signed)
CRITICAL VALUE ALERT  Critical value received:  Potassium 6.1  Date of notification:  02/01/15  Time of notification:  1900  Critical value read back:Yes.    Nurse who received alert:  S. Hunnicutt RN  MD notified (1st page):  Dr. Jeneen Rinks  Time of first page:  58  MD notified (2nd page):  Time of second page:  Responding MD:  Dr. Jeneen Rinks  Time MD responded: 1900

## 2015-02-01 NOTE — H&P (Addendum)
[facility] History and Physical  ANKUR SNOWDON ZYY:[id number] DOB: [birth year]    PCP:   [name], MD   Chief Complaint: Hyperkalemia and AKI.   HPI: JENESIS SUCHY is an 79 y.o. male with hx of CLL, prior CKD, DM, HTN, port catheter in place, sent in from oncology's office for elevated Cr to 1.99 and K of 6.1.  He has had elevated Cr in the past.  He has been taking TriamstereneHCTZ along with Lisinopril, and started to drink V8 juice.  He is asymptomatic.  EKG showed no peak T wave, and QTc was 436.  Hospitalist was asked to admit her for AKI on CKD, and hyperkalemia. He has not taken any nephrotoxic drug, but has been on Metformin for DM, and Valtrex for prophylaxis.   Rewiew of Systems:  Constitutional: Negative for malaise, fever and chills. No significant weight loss or weight gain Eyes: Negative for eye pain, redness and discharge, diplopia, visual changes, or flashes of light. ENMT: Negative for ear pain, hoarseness, nasal congestion, sinus pressure and sore throat. No headaches; tinnitus, drooling, or problem swallowing. Cardiovascular: Negative for chest pain, palpitations, diaphoresis, dyspnea and peripheral edema. ; No orthopnea, PND Respiratory: Negative for cough, hemoptysis, wheezing and stridor. No pleuritic chestpain. Gastrointestinal: Negative for nausea, vomiting, diarrhea, constipation, abdominal pain, melena, blood in stool, hematemesis, jaundice and rectal bleeding.    Genitourinary: Negative for frequency, dysuria, incontinence,flank pain and hematuria; Musculoskeletal: Negative for back pain and neck pain. Negative for swelling and trauma.;  Skin: . Negative for pruritus, rash, abrasions, bruising and skin lesion.; ulcerations Neuro: Negative for headache, lightheadedness and neck stiffness. Negative for weakness, altered level of consciousness , altered mental status, extremity weakness, burning feet, involuntary movement, seizure and syncope.   Psych: negative for anxiety, depression, insomnia, tearfulness, panic attacks, hallucinations, paranoia, suicidal or homicidal ideation    Past Medical History  Diagnosis Date  . Diabetes mellitus   . Leukemia 6.22.2012    PER PATIENT  . Hypertension   . Ulcer   . Anemia   . CLL (chronic lymphocytic leukemia) 01/24/2011  . Melanoma in situ 01/24/2011  . DM (diabetes mellitus) 01/24/2011  . Mild obesity 01/24/2011  . Eczema 01/24/2011  . Port catheter in place 05/02/2012  . Shingles   . History of pneumonia 04/2014  . B12 deficiency 12/07/2014    Past Surgical History  Procedure Laterality Date  . Hernia repair  2001  . Bleeding ulcer    . Portacath placement  2008  . Cataract extraction w/phaco  05/05/2012    Procedure: CATARACT EXTRACTION PHACO AND INTRAOCULAR LENS PLACEMENT (IOC);  Surgeon: Tonny Branch, MD;  Location: [facility];  Service: Ophthalmology;  Laterality: Right;  CDE:13.25  . Cataract extraction w/phaco  05/15/2012    Procedure: CATARACT EXTRACTION PHACO AND INTRAOCULAR LENS PLACEMENT (IOC);  Surgeon: Tonny Branch, MD;  Location: [facility];  Service: Ophthalmology;  Laterality: Left;  CDE:  12.32    Medications:  HOME MEDS: Prior to Admission medications   Medication Sig Start Date End Date Taking? Authorizing Provider  acetaminophen (TYLENOL) 500 MG tablet Take 500 mg by mouth at bedtime.   Yes Historical Provider, MD  acyclovir (ZOVIRAX) 400 MG tablet Take 1 tablet (400 mg total) by mouth 2 (two) times daily. 03/29/14  Yes Manon Hilding Kefalas, PA-C  fluocinonide cream (LIDEX) 0.05 % APPLY TO AFFECTED AREA UP TO TWICE A DAY AS NEEDED NOT TO Rodney Langton, UNDER ARMS 11/25/14  Yes Historical Provider, MD  lansoprazole (PREVACID) 30 MG capsule Take 30 mg by mouth daily. 08/30/14  Yes Historical Provider, MD  lisinopril (PRINIVIL,ZESTRIL) 5 MG tablet Take 5 mg by mouth once a week.  10/25/14  Yes Historical Provider, MD  metFORMIN (GLUCOPHAGE) 500 MG tablet Take 500-1,000 mg by mouth 2  (two) times daily with a meal. 2 tablets in the morning and 1 tablet in the evening.   Yes Historical Provider, MD  ranitidine (ZANTAC) 150 MG tablet Take 150 mg by mouth 3 (three) times daily with meals.   Yes Historical Provider, MD  rosuvastatin (CRESTOR) 20 MG tablet Take 20 mg by mouth daily.    Yes Historical Provider, MD  triamterene-hydrochlorothiazide (MAXZIDE-25) 37.5-25 MG per tablet Take 1 tablet by mouth daily.  10/05/14  Yes Historical Provider, MD  ondansetron (ZOFRAN) 8 MG tablet Take 1 tablet (8 mg total) by mouth every 8 (eight) hours as needed for nausea or vomiting. Patient not taking: Reported on 12/07/2014 08/19/14   Daleen Bo, MD     Allergies:  Allergies  Allergen Reactions  . Aspirin Other (See Comments)    Bleeding ulcers.   . Ibuprofen Other (See Comments)    Bleeding ulcers.    Social History:   reports that he has quit smoking. He quit smokeless tobacco use about 47 years ago. He reports that he does not drink alcohol or use illicit drugs.  Family History: Family History  Problem Relation Age of Onset  . Diabetes Father      Physical Exam: Filed Vitals:   02/01/15 1920 02/01/15 1930 02/01/15 2000 02/01/15 2030  BP: 121/74 111/69 145/121 118/72  Pulse: 64 65 72 71  Temp:      TempSrc:      Resp: _0 Height:      Weight:      SpO2: 99% 99% 100% 98%   Blood pressure 118/72, pulse 71, temperature 97.5 F (36.4 C), temperature source Oral, resp. rate 16, height _1  (1.575 m), weight 57.607 kg (127 lb), SpO2 98 %.  GEN:  Pleasant  patient lying in the stretcher in no acute distress; cooperative with exam. PSYCH:  alert and oriented x4; does not appear anxious or depressed; affect is appropriate. HEENT: Mucous membranes pink and anicteric; PERRLA; EOM intact; no cervical lymphadenopathy nor thyromegaly or carotid bruit; no JVD; There were no stridor. Neck is very supple. Breasts:: Not examined CHEST WALL: No tenderness.  Port on right  upper chest.  CHEST: Normal respiration, clear to auscultation bilaterally.  HEART: Regular rate and rhythm.  There are no murmur, rub, or gallops.   BACK: No kyphosis or scoliosis; no CVA tenderness ABDOMEN: soft and non-tender; no masses, no organomegaly, normal abdominal bowel sounds; no pannus; no intertriginous candida. There is no rebound and no distention. Rectal Exam: Not done EXTREMITIES: No bone or joint deformity; age-appropriate arthropathy of the hands and knees; no edema; no ulcerations.  There is no calf tenderness. Genitalia: not examined PULSES: 2+ and symmetric SKIN: Normal hydration no rash or ulceration CNS: Cranial nerves 2-12 grossly intact no focal lateralizing neurologic deficit.  Speech is fluent; uvula elevated with phonation, facial symmetry and tongue midline. DTR are normal bilaterally, cerebella exam is intact, barbinski is negative and strengths are equaled bilaterally.  No sensory loss.   Labs on Admission:  Basic Metabolic Panel:  Recent Labs Lab 02/01/15 1320 02/01/15 1824  NA 136 137  K 6.6* 6.1*  CL 112* 111  CO2 20* 21*  GLUCOSE 89 126*  BUN 17 19  CREATININE 1.70* 1.99*  CALCIUM 8.9 8.8*   Liver Function Tests:  Recent Labs Lab 02/01/15 1320  AST 28  ALT 16*  ALKPHOS 83  BILITOT 0.4  PROT 8.8*  ALBUMIN 3.7   CBC:  Recent Labs Lab 02/01/15 1320  WBC 9.5  NEUTROABS 2.3  HGB 11.8*  HCT 37.3*  MCV 103.0*  PLT 195   Radiological Exams on Admission:  EKG: Independently reviewed. No peaked Ts, QTc OK.     Assessment/Plan Present on Admission:  . AKI (acute kidney injury) . Hyperkalemia . CLL (chronic lymphocytic leukemia) . HTN (hypertension)  PLAN:  He likely has AKI on CKD, and has been on ACE I along with K sparing diuretics.  Will d/c both, give gentle IVF, shift K intracellularly with Glucose and Insulin, stabalize potential with calcium,  and bind K with Kayexalate.  I will consult nephrology for further  recommnendation.  Will follow up basic met profile in 4 hours, and repeat in am.  He would not benefit taking Metformin, so will d/c that as well.  Will hold his Acyclovir for now as well.  His CLL has been stable, and his WBC is 9K.  Will admit him to Dr Pearline Cables service as per prior arrangement. Thank you and Good Day.   Other plans as per orders.  Code Status: FULL Haskel Khan, MD. [facility] Pager [phone] 7pm to 7am.  02/01/2015, 8:46 PM

## 2015-02-01 NOTE — Progress Notes (Signed)
Randall Hampshire, MD [place] [address]  CLL (chronic lymphocytic leukemia)  B12 deficiency - Plan: Vitamin B12, cyanocobalamin ((VITAMIN B-12)) injection 1,000 mcg  CURRENT THERAPY: Surveillance.  INTERVAL HISTORY: Randall Reyes 79 y.o. male returns for followup of CLL.    CLL (chronic lymphocytic leukemia)   02/14/2005 Procedure Peripheral flow cytometry- CLL (without 11q or 17 p deletion) presenting with stage 0 disease   10/25/2008 Progression Increasing WBC and symptoms suggestive of progressive disease   11/09/2008 - 01/07/2009 Chemotherapy Rituxan, fludarabine, cytoxan x 3 cycles with the discontinuation of fludarabine and cytoxan due to severe pancytopenia requiring hospitalization.   01/10/2009 Adverse Reaction Severe pancytopenia and adverse side effects   02/01/2009 - 04/01/2009 Chemotherapy Rituxan every 21 days x 3 cycles   04/02/2009 Remission Stable disease.  Chemotherapy holiday started   06/22/2013 - 06/29/2013 Chemotherapy Imbruvica daily   06/29/2013 Adverse Reaction Large right posterior leg (popliteal) ecchymosis/violaceous rash.  Likely Imbruvica-induced   07/31/2013 - 10/12/2013 Chemotherapy Zydelig (idelalisib) 150 mg BID monotherapy   10/12/2013 Adverse Reaction Transaminitis, suspected to be secondary to [place].  Dose decreased.   10/12/2013 - 11/09/2013 Chemotherapy Zydelig (idelalisib) 150 mg daily.   11/09/2013 Adverse Reaction Worsening transaminitis.  Zydelig on HOLD   11/09/2013 Treatment Plan Change Hold Zydelig   12/28/2013 Adverse Reaction Herpes Zoster outbreak.  Partial response with Famvir x 1 week, switched to acyclovir.  Progression over 1-2 weeks with development of keratoconjunctivitis bilaterally.  VZ Immune Globulin 625 mg IM given on 01/13/14.   02/08/2014 Treatment Plan Change Ibrutinib restarted 140 mg daily for one week followed by 280 mg daily followed by 420 mg daily with prophylactic acyclovir.   02/08/2014 - 07/05/2014  Chemotherapy Ibrutinib 420 mg daily   101/23/202015 - 04/17/2014 Hospital Admission Right lower lobe pneumonia   07/05/2014 Adverse Reaction Fissuring of fingertips and sore, red feet with transaminitis   07/05/2014 Treatment Plan Change Hold Ibrutinib   07/22/2014 - 08/19/2014 Chemotherapy Ibrutinib restarted at 280 mg daily.   08/09/2014 - 08/12/2014 Hospital Admission Bilateral pneumonia   08/19/2014 Adverse Reaction Nausea with vomiting.  Escorted to ED.   08/19/2014 Treatment Plan Change Ibrutinib on hold- ED escort from clinic    10/18/2014 Miscellaneous B12 weekly x 4 then monthly.     I personally reviewed and went over laboratory results with the patient.  The results are noted within this dictation.  Randall Reyes recently has 3 teeth extracted.  He is now edentulous.  He will be fitted for dentures in about 6 weeks or so.    He notes that his energy is returning.  He asks about his BP medication and I will defer this to his primary care provider.  He denies any B symptoms.  Past Medical History  Diagnosis Date  . Diabetes mellitus   . Leukemia 6.22.2012    PER PATIENT  . Hypertension   . Ulcer   . Anemia   . CLL (chronic lymphocytic leukemia) 01/24/2011  . Melanoma in situ 01/24/2011  . DM (diabetes mellitus) 01/24/2011  . Mild obesity 01/24/2011  . Eczema 01/24/2011  . Port catheter in place 05/02/2012  . Shingles   . History of pneumonia 04/2014  . B12 deficiency 12/07/2014    has CLL (chronic lymphocytic leukemia); Melanoma in situ; DM (diabetes mellitus); Mild obesity; Eczema; Port catheter in place; Pneumonia; Sepsis due to pneumonia; Leukocytosis; HCAP (healthcare-associated pneumonia); AKI (acute kidney injury); Hyperkalemia; Malnutrition of moderate degree; and  B12 deficiency on his problem list.     is allergic to aspirin and ibuprofen.  Randall Reyes does not currently have medications on file.  Past Surgical History  Procedure Laterality Date  . Hernia repair  2001  . Bleeding  ulcer    . Portacath placement  2008  . Cataract extraction w/phaco  05/05/2012    Procedure: CATARACT EXTRACTION PHACO AND INTRAOCULAR LENS PLACEMENT (IOC);  Surgeon: Tonny Branch, MD;  Location: [facility];  Service: Ophthalmology;  Laterality: Right;  CDE:13.25  . Cataract extraction w/phaco  05/15/2012    Procedure: CATARACT EXTRACTION PHACO AND INTRAOCULAR LENS PLACEMENT (IOC);  Surgeon: Tonny Branch, MD;  Location: [facility];  Service: Ophthalmology;  Laterality: Left;  CDE:  12.32    Denies any headaches, dizziness, double vision, fevers, chills, night sweats, nausea, vomiting, diarrhea, constipation, chest pain, heart palpitations, shortness of breath, blood in stool, black tarry stool, urinary pain, urinary burning, urinary frequency, hematuria.   PHYSICAL EXAMINATION  ECOG PERFORMANCE STATUS: 1  Filed Vitals:   02/01/15 1300  BP: 132/71  Pulse: 64  Temp: 97.8 F (36.6 C)  Resp: 18    GENERAL:alert, no distress, well nourished, well developed, comfortable, cooperative and smiling SKIN: skin color, texture, turgor are normal, no rashes or significant lesions HEAD: Normocephalic, No masses, lesions, tenderness or abnormalities EYES: normal, PERRLA, EOMI, Conjunctiva are pink and non-injected EARS: External ears normal OROPHARYNX:lips, buccal mucosa, and tongue normal and mucous membranes are moist, edentulous  NECK: supple, no adenopathy, thyroid normal size, non-tender, without nodularity, no stridor, non-tender, trachea midline LYMPH:  no palpable lymphadenopathy BREAST:not examined LUNGS: clear to auscultation and percussion HEART: regular rate & rhythm, no murmurs, no gallops, S1 normal and S2 normal ABDOMEN:abdomen soft, non-tender, normal bowel sounds and no masses or organomegaly BACK: Back symmetric, no curvature. EXTREMITIES:less then 2 second capillary refill, no joint deformities, effusion, or inflammation, no skin discoloration, no cyanosis.   NEURO: alert & oriented x 3  with fluent speech, no focal motor/sensory deficits, gait normal   LABORATORY DATA: CBC    Component Value Date/Time   WBC 10.3 01/04/2015 1230   RBC 3.58* 01/04/2015 1230   RBC 2.82* 05/21/2014 1422   HGB 11.8* 01/04/2015 1230   HCT 36.5* 01/04/2015 1230   PLT 213 01/04/2015 1230   MCV 102.0* 01/04/2015 1230   MCH 33.0 01/04/2015 1230   MCHC 32.3 01/04/2015 1230   RDW 16.3* 01/04/2015 1230   LYMPHSABS 4.6* 01/04/2015 1230   MONOABS 1.2* 01/04/2015 1230   EOSABS 0.6 01/04/2015 1230   BASOSABS 0.0 01/04/2015 1230      Chemistry      Component Value Date/Time   NA 135 01/04/2015 1230   K 5.7* 01/04/2015 1230   CL 110 01/04/2015 1230   CO2 20* 01/04/2015 1230   BUN 25* 01/04/2015 1230   CREATININE 1.85* 01/04/2015 1230      Component Value Date/Time   CALCIUM 9.1 01/04/2015 1230   ALKPHOS 98 01/04/2015 1230   AST 25 01/04/2015 1230   ALT 16* 01/04/2015 1230   BILITOT 0.4 01/04/2015 1230       RADIOGRAPHIC STUDIES:  No results found.   ASSESSMENT AND PLAN:  CLL (chronic lymphocytic leukemia) CLL, diagnosed in 2006 and per records was treated with FCR in 2010 secondary to a rapid doubling time of his wbc count up to 290K with diffuse LAD, hypogammaglobulinemia. He only received 3 cycles of FCR secondary to prolonged pancytopenia.  In Jan 2015, he was  started on Imbruvica for a rising wbc and increased fatigue; other counts were relatively preserved. WBC count at that time was 36K. Therapy was interrupted secondary to rash. He was then started on Idelalisib on 07/31/2013. This therapy was held secondary to severe zoster outbreak diagnosed on 12/28/2013. Patient was given Varicella Zoster Immune Globulin IM on 01/13/2014.  He was then restarted on Imbruvica on 02/08/2014. He was dose reduced secondary to abnormal LFT's and now therapy has been discontinued secondary to pneumonia and generalized decline. He is currently doing better off therapy.  Labs today: CBC diff,  CMET  Labs in 4 and 8 weeks: CBC diff, CMET  Labs in 8 weeks: B12.  B12 injection today.  Supportive therapy plan reviewed.  Return in 8 weeks for follow-up.    THERAPY PLAN:  Continue observation.  All questions were answered. The patient knows to call the clinic with any problems, questions or concerns. We can certainly see the patient much sooner if necessary.  Patient and plan discussed with Dr. Ancil Linsey and she is in agreement with the aforementioned.   This note is electronically signed by: Robynn Pane 02/01/2015 1:21 PM

## 2015-02-01 NOTE — ED Provider Notes (Signed)
CSN: [id number]     Arrival date & time 02/01/15  1755 History   First MD Initiated Contact with Patient 02/01/15 1817     Chief Complaint  Patient presents with  . Abnormal Lab     HPI  Vision results for evaluation of increased potassium. He received a call from [facility] at the cancer center. Patient is followed there with a long history of CLL. Has been treated in the past. Not undergoing current treatment and has been stable with his labs for many years. Had routine labs today and received a call that there was a potassium of 6.6. Has no weakness. Is not lightheaded or dizzy. Since other than he is on unknown antibiotics for the last several days after having "some teeth cut out".  History of mild renal insufficiency baseline creatinine between 1.5 and 1.9.  Past Medical History  Diagnosis Date  . Diabetes mellitus   . Leukemia 6.22.2012    PER PATIENT  . Hypertension   . Ulcer   . Anemia   . CLL (chronic lymphocytic leukemia) 01/24/2011  . Melanoma in situ 01/24/2011  . DM (diabetes mellitus) 01/24/2011  . Mild obesity 01/24/2011  . Eczema 01/24/2011  . Port catheter in place 05/02/2012  . Shingles   . History of pneumonia 04/2014  . B12 deficiency 12/07/2014   Past Surgical History  Procedure Laterality Date  . Hernia repair  2001  . Bleeding ulcer    . Portacath placement  2008  . Cataract extraction w/phaco  05/05/2012    Procedure: CATARACT EXTRACTION PHACO AND INTRAOCULAR LENS PLACEMENT (IOC);  Surgeon: Tonny Branch, MD;  Location: [facility];  Service: Ophthalmology;  Laterality: Right;  CDE:13.25  . Cataract extraction w/phaco  05/15/2012    Procedure: CATARACT EXTRACTION PHACO AND INTRAOCULAR LENS PLACEMENT (IOC);  Surgeon: Tonny Branch, MD;  Location: [facility];  Service: Ophthalmology;  Laterality: Left;  CDE:  12.32   Family History  Problem Relation Age of Onset  . Diabetes Father    Social History  Substance Use Topics  . Smoking status: Former Research scientist (life sciences)  . Smokeless  tobacco: Former Systems developer    Quit date: 04/25/1967  . Alcohol Use: No    Review of Systems  Constitutional: Negative for fever, chills, diaphoresis, appetite change and fatigue.  HENT: Negative for mouth sores, sore throat and trouble swallowing.   Eyes: Negative for visual disturbance.  Respiratory: Negative for cough, chest tightness, shortness of breath and wheezing.   Cardiovascular: Negative for chest pain.  Gastrointestinal: Negative for nausea, vomiting, abdominal pain, diarrhea and abdominal distention.  Endocrine: Negative for polydipsia, polyphagia and polyuria.  Genitourinary: Negative for dysuria, frequency and hematuria.  Musculoskeletal: Negative for gait problem.  Skin: Negative for color change, pallor and rash.  Neurological: Negative for dizziness, syncope, light-headedness and headaches.  Hematological: Does not bruise/bleed easily.  Psychiatric/Behavioral: Negative for behavioral problems and confusion.      Allergies  Aspirin and Ibuprofen  Home Medications   Prior to Admission medications   Medication Sig Start Date End Date Taking? Authorizing Provider  acetaminophen (TYLENOL) 500 MG tablet Take 500 mg by mouth at bedtime.   Yes Historical Provider, MD  acyclovir (ZOVIRAX) 400 MG tablet Take 1 tablet (400 mg total) by mouth 2 (two) times daily. 03/29/14  Yes Manon Hilding Kefalas, PA-C  fluocinonide cream (LIDEX) 0.05 % APPLY TO AFFECTED AREA UP TO TWICE A DAY AS NEEDED NOT TO Rodney Langton, UNDER ARMS 11/25/14  Yes Historical Provider, MD  lansoprazole (PREVACID) 30 MG capsule Take 30 mg by mouth daily. 08/30/14  Yes Historical Provider, MD  lisinopril (PRINIVIL,ZESTRIL) 5 MG tablet Take 5 mg by mouth once a week.  10/25/14  Yes Historical Provider, MD  metFORMIN (GLUCOPHAGE) 500 MG tablet Take 500-1,000 mg by mouth 2 (two) times daily with a meal. 2 tablets in the morning and 1 tablet in the evening.   Yes Historical Provider, MD  ranitidine (ZANTAC) 150 MG tablet Take  150 mg by mouth 3 (three) times daily with meals.   Yes Historical Provider, MD  rosuvastatin (CRESTOR) 20 MG tablet Take 20 mg by mouth daily.    Yes Historical Provider, MD  triamterene-hydrochlorothiazide (MAXZIDE-25) 37.5-25 MG per tablet Take 1 tablet by mouth daily.  10/05/14  Yes Historical Provider, MD  ondansetron (ZOFRAN) 8 MG tablet Take 1 tablet (8 mg total) by mouth every 8 (eight) hours as needed for nausea or vomiting. Patient not taking: Reported on 12/07/2014 08/19/14   Daleen Bo, MD   BP 121/74 mmHg  Pulse 64  Temp(Src) 97.5 F (36.4 C) (Oral)  Resp 17  Ht 5\' 2"  (1.575 m)  Wt 127 lb (57.607 kg)  BMI 23.22 kg/m2  SpO2 99% Physical Exam  Constitutional: He is oriented to person, place, and time. He appears well-developed and well-nourished. No distress.  HENT:  Head: Normocephalic.  Eyes: Conjunctivae are normal. Pupils are equal, round, and reactive to light. No scleral icterus.  Neck: Normal range of motion. Neck supple. No thyromegaly present.  Cardiovascular: Normal rate and regular rhythm.  Exam reveals no gallop and no friction rub.   No murmur heard. Pulmonary/Chest: Effort normal and breath sounds normal. No respiratory distress. He has no wheezes. He has no rales.  Abdominal: Soft. Bowel sounds are normal. He exhibits no distension. There is no tenderness. There is no rebound.  Musculoskeletal: Normal range of motion.  Neurological: He is alert and oriented to person, place, and time.  Skin: Skin is warm and dry. No rash noted.  Psychiatric: He has a normal mood and affect. His behavior is normal.    ED Course  Procedures (including critical care time) Labs Review Labs Reviewed  BASIC METABOLIC PANEL - Abnormal; Notable for the following:    Potassium 6.1 (*)    CO2 21 (*)    Glucose, Bld 126 (*)    Creatinine, Ser 1.99 (*)    Calcium 8.8 (*)    GFR calc non Af Amer 31 (*)    GFR calc Af Amer 36 (*)    All other components within normal limits     Imaging Review No results found. I have personally reviewed and evaluated these images and lab results as part of my medical decision-making.   EKG Interpretation   Date/Time:  Tuesday February 01 2015 19:15:16 EDT Ventricular Rate:  65 PR Interval:  274 QRS Duration: 126 QT Interval:  419 QTC Calculation: 436 R Axis:   -146 Text Interpretation:  Sinus rhythm Prolonged PR interval Right bundle  branch block Confirmed by Jeneen Rinks  MD, [place] ([id number]) on 02/01/2015 7:33:54 PM      MDM   Final diagnoses:  Hyperkalemia    Potassium repeated and confirmed 6.1. Given by mouth Kayexalate. Started on IV fluids. I discussed the case with Dr. Raynelle Chary at [facility]. He is here evaluating the patient for admission. Patient seemed in stable, asymptomatic, has no EKG hyperkalemia changes.  No indication for bicarbonate, calcium etc. at this time.  Tanna Furry, MD 02/01/15 1958

## 2015-02-01 NOTE — Progress Notes (Signed)
Please see doctors encounter for more information 

## 2015-02-01 NOTE — Progress Notes (Deleted)
Randall Reyes presents today for injection per the Jamire Shabazz's orders.  B12 administration without incident; see MAR for injection details.  Patient tolerated procedure well and without incident.  No questions or complaints noted at this time. 

## 2015-02-01 NOTE — ED Notes (Addendum)
Pt reports had blood work completed today. Dr. [name] in the [facility] reported pt potassium level elevated and to come to ER for evaluation. Pt denies any pain or symptoms at this time. Pt reports is being seen for leukemia. Pt stopped treatment x6 months ago. Per Chart, pt potassium 6.6

## 2015-02-01 NOTE — Telephone Encounter (Signed)
CRITICAL VALUE ALERT Critical value received:  Potassium 6.6 Date of notification:  02/01/15 Time of notification: 2536 Critical value read back:  Yes.   Nurse who received alert:  Mickie Kay, RN Results given to Robynn Pane, PA-C @ [phone]

## 2015-02-02 ENCOUNTER — Observation Stay (HOSPITAL_COMMUNITY): Payer: Medicare Other

## 2015-02-02 LAB — BASIC METABOLIC PANEL
Anion gap: 4 — ABNORMAL LOW (ref 5–15)
BUN: 16 mg/dL (ref 6–20)
BUN: 14 mg/dL (ref 6–20)
CHLORIDE: 114 mmol/L — AB (ref 101–111)
CHLORIDE: 117 mmol/L — AB (ref 101–111)
CO2: 19 mmol/L — AB (ref 22–32)
CO2: 17 mmol/L — AB (ref 22–32)
Calcium: 8.2 mg/dL — ABNORMAL LOW (ref 8.9–10.3)
Calcium: 8.1 mg/dL — ABNORMAL LOW (ref 8.9–10.3)
Creatinine, Ser: 1.68 mg/dL — ABNORMAL HIGH (ref 0.61–1.24)
Creatinine, Ser: 1.56 mg/dL — ABNORMAL HIGH (ref 0.61–1.24)
GFR calc Af Amer: 44 mL/min — ABNORMAL LOW (ref >60)
GFR calc non Af Amer: 38 mL/min — ABNORMAL LOW (ref >60)
GFR calc non Af Amer: 41 mL/min — ABNORMAL LOW (ref >60)
GFR, EST AFRICAN AMERICAN: 48 mL/min — AB (ref >60)
GLUCOSE: 73 mg/dL (ref 65–99)
GLUCOSE: 90 mg/dL (ref 65–99)
POTASSIUM: 5.6 mmol/L — AB (ref 3.5–5.1)
POTASSIUM: 5.4 mmol/L — AB (ref 3.5–5.1)
Sodium: 137 mmol/L (ref 135–145)
Sodium: 136 mmol/L (ref 135–145)

## 2015-02-02 LAB — CBC
HEMATOCRIT: 33 % — AB (ref 39.0–52.0)
HEMOGLOBIN: 10.4 g/dL — AB (ref 13.0–17.0)
MCH: 32.3 pg (ref 26.0–34.0)
MCHC: 31.5 g/dL (ref 30.0–36.0)
MCV: 102.5 fL — AB (ref 78.0–100.0)
Platelets: 154 10*3/uL (ref 150–400)
RBC: 3.22 MIL/uL — ABNORMAL LOW (ref 4.22–5.81)
RDW: 15.7 % — AB (ref 11.5–15.5)
WBC: 7.4 10*3/uL (ref 4.0–10.5)

## 2015-02-02 LAB — GLUCOSE, CAPILLARY
GLUCOSE-CAPILLARY: 82 mg/dL (ref 65–99)
GLUCOSE-CAPILLARY: 117 mg/dL — AB (ref 65–99)
Glucose-Capillary: 76 mg/dL (ref 65–99)

## 2015-02-02 MED ORDER — SODIUM BICARBONATE 650 MG PO TABS
650.0000 mg | ORAL_TABLET | Freq: Three times a day (TID) | ORAL | Status: DC
  Administered 2015-02-02 – 2015-02-03 (×3): 650 mg via ORAL
  Filled 2015-02-02 (×4): qty 1

## 2015-02-02 MED ORDER — PANTOPRAZOLE SODIUM 40 MG PO TBEC
40.0000 mg | DELAYED_RELEASE_TABLET | Freq: Every day | ORAL | Status: DC
  Administered 2015-02-02 – 2015-02-03 (×2): 40 mg via ORAL
  Filled 2015-02-02 (×2): qty 1

## 2015-02-02 NOTE — Progress Notes (Signed)
Subjective: The patient is alert and oriented this morning. He does have a history of CLL, CK-D, and DM, HTN,. He was sent over because of elevated potassium from oncologist office  Objective: Vital signs in last 24 hours: Temp:  [97.3 F (36.3 C)-97.8 F (36.6 C)] 97.4 F (36.3 C) (09/21 0536) Pulse Rate:  [48-83] 48 (09/21 0536) Resp:  [14-22] 18 (09/21 0536) BP: (111-145)/(66-121) 126/66 mmHg (09/21 0536) SpO2:  [97 %-100 %] 99 % (09/21 0536) Weight:  [55.021 kg (121 lb 4.8 oz)-57.607 kg (127 lb)] 55.747 kg (122 lb 14.4 oz) (09/21 0542) Weight change:  Last BM Date: 02/01/15  Intake/Output from previous day: 09/20 0701 - 09/21 0700 In: 1387.5 [I.V.:1387.5] Out: 200 [Urine:200] Intake/Output this shift: Total I/O In: 1387.5 [I.V.:1387.5] Out: 200 [Urine:200]  Physical Exam: General appearance patient is alert and oriented  HEENT negative  Neck supple no JVD or thyroid abnormalities  Heart regular rhythm no murmurs  Lungs clear to P&A  Abdomen no palpable organs or masses  Extremities free of edema   Recent Labs  02/01/15 1320  WBC 9.5  HGB 11.8*  HCT 37.3*  PLT 195   BMET  Recent Labs  02/01/15 1824 02/02/15 0108  NA 137 137  K 6.1* 5.6*  CL 111 114*  CO2 21* 19*  GLUCOSE 126* 73  BUN 19 16  CREATININE 1.99* 1.68*  CALCIUM 8.8* 8.2*    Studies/Results: No results found.  Medications:  . acetaminophen  500 mg Oral QHS  . famotidine  20 mg Oral Daily  . heparin  5,000 Units Subcutaneous 3 times per day  . insulin aspart  0-5 Units Subcutaneous QHS  . insulin aspart  0-9 Units Subcutaneous TID WC  . pantoprazole  20 mg Oral Daily  . rosuvastatin  20 mg Oral q1800  . sodium chloride  3 mL Intravenous Q12H    . sodium chloride 50 mL/hr at 02/01/15 2211     Assessment/Plan: 1. Acute kidney injury with hyperkalemia-patient's been placed on Kayexalate will continue to monitor obtain nephrology consult repeat be met  CLL stable-to be  followed by oncology     MCINNIS,ANGUS G 02/02/2015, 6:42 AM   

## 2015-02-02 NOTE — Progress Notes (Signed)
24 Hour urine collection started at 1740. Pt. And wife instructed to keep all urine. Verbalizes understanding.

## 2015-02-02 NOTE — Consult Note (Addendum)
Randall Reyes MRN: [id number] DOB/AGE: 11-09-1935 79 y.o. Primary Care Physician:MCINNIS,ANGUS G, MD Admit date: 02/01/2015 Chief Complaint:  Chief Complaint  Patient presents with  . Abnormal Lab   HPI: Pt is  79 year ol male with past medical hx of DM who was sent from oncologist office with c/o abnormal labs  HPI dates back to yesterday when pt had his blood drawn and ahd abnormal labs with high potassium and was sent to ER. Pt was admitted for further care. Pt seen today on 3rd floor. Pt offer no new complaints  NO c/o chest pain/ dyspnea NO c/p palpitation NO c/o fever/cough/chills  NO c/o frequency/urgency/dysuria NO c/o nausea/ vomiting/abdminal pain Pt says he has his teeth pulled and was not  eating much but was drinking V8 vegetable soup.   Past Medical History  Diagnosis Date  . Diabetes mellitus   . Leukemia 6.22.2012    PER PATIENT  . Hypertension   . Ulcer   . Anemia   . CLL (chronic lymphocytic leukemia) 01/24/2011  . Melanoma in situ 01/24/2011  . DM (diabetes mellitus) 01/24/2011  . Mild obesity 01/24/2011  . Eczema 01/24/2011  . Port catheter in place 05/02/2012  . Shingles   . History of pneumonia 04/2014  . B12 deficiency 12/07/2014     Family History  Problem Relation Age of Onset  . Diabetes Father     Social History:  reports that he has quit smoking. He quit smokeless tobacco use about 47 years ago. He reports that he does not drink alcohol or use illicit drugs.   Allergies:  Allergies  Allergen Reactions  . Aspirin Other (See Comments)    Bleeding ulcers.   . Ibuprofen Other (See Comments)    Bleeding ulcers.    Medications Prior to Admission  Medication Sig Dispense Refill  . acetaminophen (TYLENOL) 500 MG tablet Take 500 mg by mouth at bedtime.    [name] acyclovir (ZOVIRAX) 400 MG tablet Take 1 tablet (400 mg total) by mouth 2 (two) times daily. 60 tablet 6  . fluocinonide cream (LIDEX) 0.05 % APPLY TO AFFECTED AREA UP TO TWICE A DAY  AS NEEDED NOT TO FACE, GROIN, UNDER ARMS  3  . lansoprazole (PREVACID) 30 MG capsule Take 30 mg by mouth daily.  0  . lisinopril (PRINIVIL,ZESTRIL) 5 MG tablet Take 5 mg by mouth once a week.   3  . metFORMIN (GLUCOPHAGE) 500 MG tablet Take 500-1,000 mg by mouth 2 (two) times daily with a meal. 2 tablets in the morning and 1 tablet in the evening.    . ranitidine (ZANTAC) 150 MG tablet Take 150 mg by mouth 3 (three) times daily with meals.    . rosuvastatin (CRESTOR) 20 MG tablet Take 20 mg by mouth daily.     [name] triamterene-hydrochlorothiazide (MAXZIDE-25) 37.5-25 MG per tablet Take 1 tablet by mouth daily.   2  . ondansetron (ZOFRAN) 8 MG tablet Take 1 tablet (8 mg total) by mouth every 8 (eight) hours as needed for nausea or vomiting. (Patient not taking: Reported on 12/07/2014) 20 tablet 0       XBM:WUXLK from the symptoms mentioned above,there are no other symptoms referable to all systems reviewed.  [name] acetaminophen  500 mg Oral QHS  . famotidine  20 mg Oral Daily  . heparin  5,000 Units Subcutaneous 3 times per day  . insulin aspart  0-5 Units Subcutaneous QHS  . insulin aspart  0-9 Units Subcutaneous TID WC  .  pantoprazole  40 mg Oral Daily  . rosuvastatin  20 mg Oral q1800  . sodium chloride  3 mL Intravenous Q12H      Physical Exam: Vital signs in last 24 hours: Temp:  [97.3 F (36.3 C)-97.8 F (36.6 C)] 97.4 F (36.3 C) (09/21 0536) Pulse Rate:  [48-83] 48 (09/21 0536) Resp:  [14-22] 18 (09/21 0536) BP: (111-145)/(66-121) 126/66 mmHg (09/21 0536) SpO2:  [97 %-100 %] 99 % (09/21 0536) Weight:  [121 lb 4.8 oz (55.021 kg)-127 lb (57.607 kg)] 122 lb 14.4 oz (55.747 kg) (09/21 0542) Weight change:  Last BM Date: 02/01/15  Intake/Output from previous day: 09/20 0701 - 09/21 0700 In: 1387.5 [I.V.:1387.5] Out: 200 [Urine:200] Total I/O In: 240 [P.O.:240] Out: -    Physical Exam: General- pt is awake,alert, oriented to time place and person Resp- No acute REsp  distress, CTA B/L NO Rhonchi CVS- S1S2 regular ij rate and rhythm GIT- BS+, soft, NT, ND EXT- NO LE Edema, Cyanosis CNS- CN 2-12 grossly intact. Moving all 4 extremities Psych- normal mod and affect Access- PC / AVF/ AVG   Lab Results: CBC  Recent Labs  02/01/15 1320 02/02/15 0704  WBC 9.5 7.4  HGB 11.8* 10.4*  HCT 37.3* 33.0*  PLT 195 154    BMET  Recent Labs  02/02/15 0108 02/02/15 0704  NA 137 136  K 5.6* 5.4*  CL 114* 117*  CO2 19* 17*  GLUCOSE 73 90  BUN 16 14  CREATININE 1.68* 1.56*  CALCIUM 8.2* 8.1*   Creat trend 2016  1.99=>1.56           1.4--  2.44 ( AKI in March)  2015   1.4--2.23 2014    1.4 2013    1.2--1.4 2012    1.4--2.0  MICRO No results found for this or any previous visit (from the past 240 hour(s)).    Lab Results  Component Value Date   CALCIUM 8.1* 02/02/2015      Impression: 1)Renal  AKI secondary to Pre renal/ATN                AKI on CKD                AKI now improving               CKD stage 3.               CKD since 2013               CKD secondary to HTN/age associated decline                Progression of CKD  Multiple AKI                Proteinura will check.                Hematuria none .                Nephrolithiasis Hx Absent   2)CVS- BP stable  Medication as outpt-pt was on  On RAS blockers On Diuretics-  3)Anemia HGb stable  4)CKD Mineral-Bone Disorder PTH not avail . Secondary Hyperparathyroidism w/u pending  Phosphorus  will check.  5)Oncology- hx of CLL Primary MD following  6)Electrolytes  Hyperkalemic        Dietary intake +   AKI + CKD + RAAS + Triamterene      Potassium trending down  NOrmonatremic   7)Acid base Co2 not at  goal     Plan:  Will ask for renal u/s Will ask for 24 hr proteinuria Will ask for CKD- BMd labs Will start PO bicarb for acidosis Agree with holding ace/hctz and triamterene Will follow bmet      [place] S 02/02/2015, 9:18 AM

## 2015-02-02 NOTE — Care Management Note (Signed)
Case Management Note  Patient Details  Name: Randall Reyes MRN: [id number] Date of Birth: 05/26/1935  Subjective/Objective:                  Pt admitted from home with AKI. Pt lives with his wife and will return home at discharge. Pt is independent with ADL's.  Action/Plan: No CM needs noted.  Expected Discharge Date:                  Expected Discharge Plan:  Home/Self Care  In-House Referral:  NA  Discharge planning Services  CM Consult  Post Acute Care Choice:  NA Choice offered to:  NA  DME Arranged:    DME Agency:     HH Arranged:    HH Agency:     Status of Service:  Completed, signed off  Medicare Important Message Given:    Date Medicare IM Given:    Medicare IM give by:    Date Additional Medicare IM Given:    Additional Medicare Important Message give by:     If discussed at [place] of Stay Meetings, dates discussed:    Additional Comments:  Joylene Draft, RN 02/02/2015, 3:24 PM

## 2015-02-02 NOTE — Progress Notes (Signed)
Patients HR going as low as 40, 2.74 sec pause reported by central telemetry. Patient in NAD when I went to assess him. Paged on-call MD to make him aware. I will follow any new orders received and continue to monitor the patient.

## 2015-02-03 LAB — GLUCOSE, CAPILLARY
GLUCOSE-CAPILLARY: 112 mg/dL — AB (ref 65–99)
GLUCOSE-CAPILLARY: 111 mg/dL — AB (ref 65–99)
Glucose-Capillary: 96 mg/dL (ref 65–99)
Glucose-Capillary: 113 mg/dL — ABNORMAL HIGH (ref 65–99)

## 2015-02-03 LAB — BASIC METABOLIC PANEL
Anion gap: 3 — ABNORMAL LOW (ref 5–15)
BUN: 10 mg/dL (ref 6–20)
CO2: 20 mmol/L — ABNORMAL LOW (ref 22–32)
Calcium: 7.9 mg/dL — ABNORMAL LOW (ref 8.9–10.3)
Chloride: 114 mmol/L — ABNORMAL HIGH (ref 101–111)
Creatinine, Ser: 1.45 mg/dL — ABNORMAL HIGH (ref 0.61–1.24)
GFR calc Af Amer: 52 mL/min — ABNORMAL LOW (ref >60)
GFR calc non Af Amer: 45 mL/min — ABNORMAL LOW (ref >60)
Glucose, Bld: 118 mg/dL — ABNORMAL HIGH (ref 65–99)
Potassium: 4.6 mmol/L (ref 3.5–5.1)
Sodium: 137 mmol/L (ref 135–145)

## 2015-02-03 LAB — IMMUNOFIXATION ELECTROPHORESIS
IgA: 63 mg/dL (ref 61–437)
IgG (Immunoglobin G), Serum: 2754 mg/dL — ABNORMAL HIGH (ref 700–1600)
IgM, Serum: 68 mg/dL (ref 15–143)
Total Protein ELP: 7 g/dL (ref 6.0–8.5)

## 2015-02-03 LAB — CREATININE, URINE, 24 HOUR
Collection Interval-UCRE24: 24 hours
Creatinine, 24H Ur: 548 mg/d — ABNORMAL LOW (ref 800–2000)
Creatinine, Urine: 21.28 mg/dL
Urine Total Volume-UCRE24: 2575 mL

## 2015-02-03 LAB — PHOSPHORUS: Phosphorus: 4 mg/dL (ref 2.5–4.6)

## 2015-02-03 LAB — VITAMIN D 25 HYDROXY (VIT D DEFICIENCY, FRACTURES): Vit D, 25-Hydroxy: 17.8 ng/mL — ABNORMAL LOW (ref 30.0–100.0)

## 2015-02-03 NOTE — Discharge Summary (Signed)
Physician Discharge Summary  Randall Reyes [id number]:[id number] DOB: 08-May-1936 DOA: 02/01/2015  PCP: [name], MD  Admit date: 02/01/2015 Discharge date: 02/03/2015   Follow-up Information    Follow up with [facility] S, MD In 3 weeks.   Specialty:  Nephrology   Why:  baic metabolic panel and follow up   Contact information:   [address] [address] [phone]       Discharge Diagnoses:  1. Hyperkalemia 2. CK D stage III with acute kidney injury 3. Diabetes mellitus 4. Chronic lymphocytic leukemia with anemia 5. Metabolic acidosis  Discharge Condition: Stable Disposition: Home  Diet recommendation: Low-sodium diet  Filed Weights   02/01/15 1759 02/01/15 2158 02/02/15 0542  Weight: 57.607 kg (127 lb) 55.021 kg (121 lb 4.8 oz) 55.747 kg (122 lb 14.4 oz)    History of present illness:  The patient was sent from oncology office for admission because of elevated serum potassium. He had potassium 6.1 and creatinine 1.99. The patient has chronic lymphocytic leukemia, diabetes mellitus, hypertension and CK D  Hospital Course:  The patient was thought to have a KI had been on ace inhibitors along with K sparing diarrhetic's. This was withheld he was given IV fluids with glucose and insulin. His chemistries were monitored and his potassium gradually returned to normal. He was seen in consultation by nephrology. His felt he had acute kidney injury. As noted he had 1700 mL of urine output had his high potassium was corrected. It was felt that his blood pressure was reasonably controlled his CLL is to be followed by oncology. His metabolic acidosis improved. He was continued on medications listed below and was felt he could be discharged      Discharge Instructions The patient is to continue medications listed below. He is to follow-up with primary care physician and instructed to call for an appointment within the next week. He is to follow with  oncology as well    Medication List    TAKE these medications        acetaminophen 500 MG tablet  Commonly known as:  TYLENOL  Take 500 mg by mouth at bedtime.     acyclovir 400 MG tablet  Commonly known as:  ZOVIRAX  Take 1 tablet (400 mg total) by mouth 2 (two) times daily.     fluocinonide cream 0.05 %  Commonly known as:  LIDEX  APPLY TO AFFECTED AREA UP TO TWICE A DAY AS NEEDED NOT TO FACE, GROIN, UNDER ARMS     lansoprazole 30 MG capsule  Commonly known as:  PREVACID  Take 30 mg by mouth daily.     lisinopril 5 MG tablet  Commonly known as:  PRINIVIL,ZESTRIL  Take 5 mg by mouth once a week.     metFORMIN 500 MG tablet  Commonly known as:  GLUCOPHAGE  Take 500-1,000 mg by mouth 2 (two) times daily with a meal. 2 tablets in the morning and 1 tablet in the evening.     ondansetron 8 MG tablet  Commonly known as:  ZOFRAN  Take 1 tablet (8 mg total) by mouth every 8 (eight) hours as needed for nausea or vomiting.     ranitidine 150 MG tablet  Commonly known as:  ZANTAC  Take 150 mg by mouth 3 (three) times daily with meals.     rosuvastatin 20 MG tablet  Commonly known as:  CRESTOR  Take 20 mg by mouth daily.     triamterene-hydrochlorothiazide 37.5-25 MG per  tablet  Commonly known as:  MAXZIDE-25  Take 1 tablet by mouth daily.       Allergies  Allergen Reactions  . Aspirin Other (See Comments)    Bleeding ulcers.   . Ibuprofen Other (See Comments)    Bleeding ulcers.    The results of significant diagnostics from this hospitalization (including imaging, microbiology, ancillary and laboratory) are listed below for reference.    Significant Diagnostic Studies: US Renal  02/02/2015   CLINICAL DATA:  Acute tubular necrosis.  EXAM: RENAL / URINARY TRACT ULTRASOUND COMPLETE  COMPARISON:  None.  FINDINGS: Right Kidney:  Length: 8.4 cm. Echogenicity within normal limits. No mass or hydronephrosis visualized.  Left Kidney:  Length: 8.3 cm. Echogenicity within  normal limits. No mass or hydronephrosis visualized.  Bladder:  Appears normal for degree of bladder distention.  IMPRESSION: Bilateral renal atrophy is noted. No hydronephrosis or renal obstruction is noted.   Electronically Signed   By: Marijo Conception, M.D.   On: 02/02/2015 15:33    Microbiology: No results found for this or any previous visit (from the past 240 hour(s)).   Labs: Basic Metabolic Panel:  Recent Labs Lab 02/01/15 1320 02/01/15 1824 02/02/15 0108 02/02/15 0704 02/03/15 0639  NA 136 137 137 136 137  K 6.6* 6.1* 5.6* 5.4* 4.6  CL 112* 111 114* 117* 114*  CO2 20* 21* 19* 17* 20*  GLUCOSE 89 126* 73 90 118*  BUN 17 19 16 14 10   CREATININE 1.70* 1.99* 1.68* 1.56* 1.45*  CALCIUM 8.9 8.8* 8.2* 8.1* 7.9*  PHOS  --   --   --   --  4.0   Liver Function Tests:  Recent Labs Lab 02/01/15 1320  AST 28  ALT 16*  ALKPHOS 83  BILITOT 0.4  PROT 8.8*  ALBUMIN 3.7   No results for input(s): LIPASE, AMYLASE in the last 168 hours. No results for input(s): AMMONIA in the last 168 hours. CBC:  Recent Labs Lab 02/01/15 1320 02/02/15 0704  WBC 9.5 7.4  NEUTROABS 2.3  --   HGB 11.8* 10.4*  HCT 37.3* 33.0*  MCV 103.0* 102.5*  PLT 195 154   Cardiac Enzymes: No results for input(s): CKTOTAL, CKMB, CKMBINDEX, TROPONINI in the last 168 hours. BNP: BNP (last 3 results) No results for input(s): BNP in the last 8760 hours.  ProBNP (last 3 results) No results for input(s): PROBNP in the last 8760 hours.  CBG:  Recent Labs Lab 02/02/15 1622 02/02/15 2145 02/03/15 0742 02/03/15 1202 02/03/15 1626  GLUCAP 112* 117* 111* 96 113*    Active Problems:   CLL (chronic lymphocytic leukemia)   DM (diabetes mellitus)   Port catheter in place   AKI (acute kidney injury)   Hyperkalemia   HTN (hypertension)   Time coordinating discharge: 30 minutes  Signed:  Marjean Donna, MD 02/03/2015, 4:34 PM

## 2015-02-03 NOTE — Progress Notes (Signed)
Subjective: The patient is alert and oriented this morning. He does have history of CLL, CK D, DM, hypertension he still has a slightly elevated potassium 5.4 his been seen by nephrology as well  Objective: Vital signs in last 24 hours: Temp:  [97.5 F (36.4 C)-97.9 F (36.6 C)] 97.9 F (36.6 C) (09/22 0605) Pulse Rate:  [53-90] 53 (09/22 0605) Resp:  [16-20] 16 (09/22 0605) BP: (104-125)/(56-71) 104/56 mmHg (09/22 0605) SpO2:  [98 %-100 %] 98 % (09/22 0605) Weight change:  Last BM Date: 02/01/15  Intake/Output from previous day: 09/21 0701 - 09/22 0700 In: 1923.3 [P.O.:720; I.V.:1203.3] Out: 1700 [Urine:1700] Intake/Output this shift: Total I/O In: 1443.3 [P.O.:240; I.V.:1203.3] Out: 1200 [Urine:1200]  Physical Exam: Gen. appearance the patient is alert and oriented  HEENT negative  Neck supple no JVD or thyroid abnormalities  Heart regular rhythm no murmurs  Lungs clear to P&A  Abdomen no palpable organs or masses  Extremities free of edema   Recent Labs  02/01/15 1320 02/02/15 0704  WBC 9.5 7.4  HGB 11.8* 10.4*  HCT 37.3* 33.0*  PLT 195 154   BMET  Recent Labs  02/02/15 0108 02/02/15 0704  NA 137 136  K 5.6* 5.4*  CL 114* 117*  CO2 19* 17*  GLUCOSE 73 90  BUN 16 14  CREATININE 1.68* 1.56*  CALCIUM 8.2* 8.1*    Studies/Results: US Renal  02/02/2015   CLINICAL DATA:  Acute tubular necrosis.  EXAM: RENAL / URINARY TRACT ULTRASOUND COMPLETE  COMPARISON:  None.  FINDINGS: Right Kidney:  Length: 8.4 cm. Echogenicity within normal limits. No mass or hydronephrosis visualized.  Left Kidney:  Length: 8.3 cm. Echogenicity within normal limits. No mass or hydronephrosis visualized.  Bladder:  Appears normal for degree of bladder distention.  IMPRESSION: Bilateral renal atrophy is noted. No hydronephrosis or renal obstruction is noted.   Electronically Signed   By: Marijo Conception, M.D.   On: 02/02/2015 15:33    Medications:  . acetaminophen  500 mg Oral  QHS  . famotidine  20 mg Oral Daily  . heparin  5,000 Units Subcutaneous 3 times per day  . insulin aspart  0-5 Units Subcutaneous QHS  . insulin aspart  0-9 Units Subcutaneous TID WC  . pantoprazole  40 mg Oral Daily  . rosuvastatin  20 mg Oral q1800  . sodium bicarbonate  650 mg Oral TID  . sodium chloride  3 mL Intravenous Q12H    . sodium chloride 50 mL/hr at 02/02/15 1538     Assessment/Plan:  1 acute Kidney injury with hyperkalemia continue to monitor electrolytes continue sodium chloride  2. CLL-2 continue current regimen  3. Kidney function improving    MCINNIS,ANGUS G 02/03/2015, 6:23 AM

## 2015-02-03 NOTE — Progress Notes (Addendum)
Subjective: Patient does not have any complaint. He feels much better   Objective: Vital signs in last 24 hours: Temp:  [97.5 F (36.4 C)-97.9 F (36.6 C)] 97.9 F (36.6 C) (09/22 0605) Pulse Rate:  [53-90] 53 (09/22 0605) Resp:  [16-20] 16 (09/22 0605) BP: (104-125)/(56-71) 104/56 mmHg (09/22 0605) SpO2:  [98 %-100 %] 98 % (09/22 0605)  Intake/Output from previous day: 09/21 0701 - 09/22 0700 In: 1923.3 [P.O.:720; I.V.:1203.3] Out: 1900 [Urine:1900] Intake/Output this shift: Total I/O In: 360 [P.O.:360] Out: -    Recent Labs  02/01/15 1320 02/02/15 0704  HGB 11.8* 10.4*    Recent Labs  02/01/15 1320 02/02/15 0704  WBC 9.5 7.4  RBC 3.62* 3.22*  HCT 37.3* 33.0*  PLT 195 154    Recent Labs  02/02/15 0704 02/03/15 0639  NA 136 137  K 5.4* 4.6  CL 117* 114*  CO2 17* 20*  BUN 14 10  CREATININE 1.56* 1.45*  GLUCOSE 90 118*  CALCIUM 8.1* 7.9*   No results for input(s): LABPT, INR in the last 72 hours.  Patient is alert and in no apparent distress Chest is clear Heart RRR no s3 Abdomen: Soft none tender and positive bowl sound Extremities : No edema  Assessment/Plan: Problem#1 AKI: Multifactorial including Pre renal/ATN/ACE/Acyclovir/ Chemo?[name] His renal function is improving and none oliguric. He has 1700 cc of uirn out put Problem#2 Hyperkalemia: High potassium intake/ACE/ . His potassium has corrected Problem#3 CKD stage III: Etiology was taught to be secondary Hypertension /Reccurent AKI  Problem#4 Hypertension : His blood pressure is reasnabely controlled Problem#5 DM Problem#6 CLL: Patient is followed by oncology: On chemotherapy Problem#7 Anemia Problem#8 Metabolic acidosis: CO2 is getting better. Patient on sodium bicarboante Plan: Continue with present treatment and increase his ivf to 75 cc/hr 2] Basic metabolic panel in am   [facility] S 02/03/2015, 9:55 AM

## 2015-02-04 LAB — UIFE/LIGHT CHAINS/TP QN, 24-HR UR
% BETA, Urine: 56.7 %
ALPHA 1 URINE: 1 %
Albumin, U: 8.4 %
Alpha 2, Urine: 7.9 %
Free Kappa/Lambda Ratio: 32.03 — ABNORMAL HIGH (ref 2.04–10.37)
Free Lambda Lt Chains,Ur: 17.7 mg/L — ABNORMAL HIGH (ref 0.24–6.66)
Free Lt Chn Excr Rate: 567 mg/L — ABNORMAL HIGH (ref 1.35–24.19)
GAMMA GLOBULIN URINE: 26 %
M-SPIKE %, Urine: 12.1 % — ABNORMAL HIGH
Total Protein, Urine: 7.9 mg/dL

## 2015-02-04 LAB — PTH, INTACT AND CALCIUM
Calcium, Total (PTH): 8.3 mg/dL — ABNORMAL LOW (ref 8.6–10.2)
PTH: 24 pg/mL (ref 15–65)

## 2015-02-11 ENCOUNTER — Encounter (HOSPITAL_COMMUNITY): Payer: Medicare Other

## 2015-02-11 ENCOUNTER — Encounter (HOSPITAL_COMMUNITY): Payer: Self-pay | Admitting: Emergency Medicine

## 2015-02-11 ENCOUNTER — Inpatient Hospital Stay (HOSPITAL_COMMUNITY)
Admission: EM | Admit: 2015-02-11 | Discharge: 2015-02-14 | DRG: 684 | Disposition: A | Discharge status: Home / Self Care | Payer: Medicare Other | Attending: Family Medicine | Admitting: Family Medicine

## 2015-02-11 DIAGNOSIS — Z833 Family history of diabetes mellitus: Secondary | ICD-10-CM | POA: Diagnosis not present

## 2015-02-11 DIAGNOSIS — Z85820 Personal history of malignant melanoma of skin: Secondary | ICD-10-CM | POA: Diagnosis not present

## 2015-02-11 DIAGNOSIS — Z8701 Personal history of pneumonia (recurrent): Secondary | ICD-10-CM

## 2015-02-11 DIAGNOSIS — Z856 Personal history of leukemia: Secondary | ICD-10-CM | POA: Diagnosis not present

## 2015-02-11 DIAGNOSIS — N183 Chronic kidney disease, stage 3 (moderate): Secondary | ICD-10-CM | POA: Diagnosis present

## 2015-02-11 DIAGNOSIS — E875 Hyperkalemia: Secondary | ICD-10-CM

## 2015-02-11 DIAGNOSIS — D649 Anemia, unspecified: Secondary | ICD-10-CM | POA: Diagnosis present

## 2015-02-11 DIAGNOSIS — I129 Hypertensive chronic kidney disease with stage 1 through stage 4 chronic kidney disease, or unspecified chronic kidney disease: Secondary | ICD-10-CM | POA: Diagnosis present

## 2015-02-11 DIAGNOSIS — E1122 Type 2 diabetes mellitus with diabetic chronic kidney disease: Secondary | ICD-10-CM | POA: Diagnosis present

## 2015-02-11 DIAGNOSIS — N179 Acute kidney failure, unspecified: Principal | ICD-10-CM

## 2015-02-11 DIAGNOSIS — Z87891 Personal history of nicotine dependence: Secondary | ICD-10-CM

## 2015-02-11 LAB — CBC WITH DIFFERENTIAL/PLATELET
BASOS ABS: 0 10*3/uL (ref 0.0–0.1)
Basophils Relative: 0 %
Eosinophils Absolute: 0.4 10*3/uL (ref 0.0–0.7)
Eosinophils Relative: 3 %
HEMATOCRIT: 34.7 % — AB (ref 39.0–52.0)
HEMOGLOBIN: 10.8 g/dL — AB (ref 13.0–17.0)
LYMPHS PCT: 56 %
Lymphs Abs: 7.4 10*3/uL — ABNORMAL HIGH (ref 0.7–4.0)
MCH: 32.4 pg (ref 26.0–34.0)
MCHC: 31.1 g/dL (ref 30.0–36.0)
MCV: 104.2 fL — AB (ref 78.0–100.0)
MONO ABS: 2.1 10*3/uL — AB (ref 0.1–1.0)
Monocytes Relative: 16 %
NEUTROS ABS: 3.3 10*3/uL (ref 1.7–7.7)
Neutrophils Relative %: 25 %
Platelets: 159 10*3/uL (ref 150–400)
RBC: 3.33 MIL/uL — AB (ref 4.22–5.81)
RDW: 16.6 % — ABNORMAL HIGH (ref 11.5–15.5)
WBC: 13.2 10*3/uL — ABNORMAL HIGH (ref 4.0–10.5)

## 2015-02-11 LAB — BASIC METABOLIC PANEL
ANION GAP: 3 — AB (ref 5–15)
BUN: 32 mg/dL — ABNORMAL HIGH (ref 6–20)
CO2: 21 mmol/L — ABNORMAL LOW (ref 22–32)
Calcium: 8.7 mg/dL — ABNORMAL LOW (ref 8.9–10.3)
Chloride: 113 mmol/L — ABNORMAL HIGH (ref 101–111)
Creatinine, Ser: 2.25 mg/dL — ABNORMAL HIGH (ref 0.61–1.24)
GFR calc Af Amer: 31 mL/min — ABNORMAL LOW (ref >60)
GFR, EST NON AFRICAN AMERICAN: 26 mL/min — AB (ref >60)
GLUCOSE: 123 mg/dL — AB (ref 65–99)
POTASSIUM: 7.2 mmol/L — AB (ref 3.5–5.1)
Sodium: 137 mmol/L (ref 135–145)

## 2015-02-11 LAB — URINALYSIS, ROUTINE W REFLEX MICROSCOPIC
Bilirubin Urine: NEGATIVE
Glucose, UA: 250 mg/dL — AB
Hgb urine dipstick: NEGATIVE
Ketones, ur: NEGATIVE mg/dL
LEUKOCYTES UA: NEGATIVE
NITRITE: NEGATIVE
PH: 6 (ref 5.0–8.0)
Protein, ur: NEGATIVE mg/dL
SPECIFIC GRAVITY, URINE: 1.015 (ref 1.005–1.030)
Urobilinogen, UA: 0.2 mg/dL (ref 0.0–1.0)

## 2015-02-11 LAB — POTASSIUM: POTASSIUM: 6 mmol/L — AB (ref 3.5–5.1)

## 2015-02-11 MED ORDER — SODIUM POLYSTYRENE SULFONATE 15 GM/60ML PO SUSP
30.0000 g | Freq: Once | ORAL | Status: AC
  Administered 2015-02-11: 30 g via ORAL
  Filled 2015-02-11: qty 120

## 2015-02-11 MED ORDER — INSULIN ASPART 100 UNIT/ML IV SOLN
10.0000 [IU] | Freq: Once | INTRAVENOUS | Status: AC
  Administered 2015-02-11: 10 [IU] via INTRAVENOUS

## 2015-02-11 MED ORDER — ACYCLOVIR 200 MG PO CAPS
400.0000 mg | ORAL_CAPSULE | Freq: Two times a day (BID) | ORAL | Status: DC
  Administered 2015-02-11: 400 mg via ORAL
  Filled 2015-02-11 (×4): qty 2

## 2015-02-11 MED ORDER — PANTOPRAZOLE SODIUM 40 MG PO TBEC
40.0000 mg | DELAYED_RELEASE_TABLET | Freq: Every day | ORAL | Status: DC
  Administered 2015-02-12 – 2015-02-14 (×3): 40 mg via ORAL
  Filled 2015-02-11 (×3): qty 1

## 2015-02-11 MED ORDER — ALUM & MAG HYDROXIDE-SIMETH 200-200-20 MG/5ML PO SUSP: 30.0000 mL | Freq: Four times a day (QID) | ORAL | Status: DC | PRN

## 2015-02-11 MED ORDER — ROSUVASTATIN CALCIUM 20 MG PO TABS
20.0000 mg | ORAL_TABLET | Freq: Every day | ORAL | Status: DC
  Administered 2015-02-12 – 2015-02-14 (×3): 20 mg via ORAL
  Filled 2015-02-11 (×3): qty 1

## 2015-02-11 MED ORDER — DEXTROSE 50 % IV SOLN
1.0000 | Freq: Once | INTRAVENOUS | Status: AC
  Administered 2015-02-11: 50 mL via INTRAVENOUS

## 2015-02-11 MED ORDER — SODIUM CHLORIDE 0.9 % IV BOLUS (SEPSIS)
1000.0000 mL | Freq: Once | INTRAVENOUS | Status: AC
  Administered 2015-02-11: 1000 mL via INTRAVENOUS

## 2015-02-11 MED ORDER — ACETAMINOPHEN 325 MG PO TABS
650.0000 mg | ORAL_TABLET | Freq: Four times a day (QID) | ORAL | Status: DC | PRN
  Administered 2015-02-14: 650 mg via ORAL
  Filled 2015-02-11: qty 2

## 2015-02-11 MED ORDER — ONDANSETRON HCL 4 MG/2ML IJ SOLN: 4.0000 mg | Freq: Four times a day (QID) | INTRAMUSCULAR | Status: DC | PRN

## 2015-02-11 MED ORDER — SODIUM CHLORIDE 0.9 % IV SOLN
INTRAVENOUS | Status: DC
  Administered 2015-02-12 – 2015-02-13 (×4): via INTRAVENOUS

## 2015-02-11 MED ORDER — DEXTROSE 50 % IV SOLN
INTRAVENOUS | Status: AC
  Filled 2015-02-11: qty 50

## 2015-02-11 MED ORDER — ONDANSETRON HCL 4 MG PO TABS: 4.0000 mg | ORAL_TABLET | Freq: Four times a day (QID) | ORAL | Status: DC | PRN

## 2015-02-11 MED ORDER — SODIUM CHLORIDE 0.9 % IV BOLUS (SEPSIS)
500.0000 mL | Freq: Once | INTRAVENOUS | Status: AC
  Administered 2015-02-11: 500 mL via INTRAVENOUS

## 2015-02-11 MED ORDER — ENOXAPARIN SODIUM 30 MG/0.3ML ~~LOC~~ SOLN
30.0000 mg | SUBCUTANEOUS | Status: DC
  Administered 2015-02-11 – 2015-02-13 (×3): 30 mg via SUBCUTANEOUS
  Filled 2015-02-11 (×3): qty 0.3

## 2015-02-11 MED ORDER — SODIUM POLYSTYRENE SULFONATE 15 GM/60ML PO SUSP
15.0000 g | Freq: Four times a day (QID) | ORAL | Status: DC
  Administered 2015-02-11 – 2015-02-13 (×7): 15 g via ORAL
  Filled 2015-02-11 (×8): qty 60

## 2015-02-11 MED ORDER — ALBUTEROL SULFATE (2.5 MG/3ML) 0.083% IN NEBU
10.0000 mg | INHALATION_SOLUTION | Freq: Once | RESPIRATORY_TRACT | Status: AC
  Administered 2015-02-11: 10 mg via RESPIRATORY_TRACT
  Filled 2015-02-11: qty 12

## 2015-02-11 MED ORDER — SODIUM CHLORIDE 0.9 % IJ SOLN
3.0000 mL | Freq: Two times a day (BID) | INTRAMUSCULAR | Status: DC
  Administered 2015-02-14: 3 mL via INTRAVENOUS

## 2015-02-11 MED ORDER — INSULIN ASPART 100 UNIT/ML ~~LOC~~ SOLN
SUBCUTANEOUS | Status: AC
  Filled 2015-02-11: qty 1

## 2015-02-11 MED ORDER — FUROSEMIDE 10 MG/ML IJ SOLN
20.0000 mg | Freq: Once | INTRAMUSCULAR | Status: AC
  Administered 2015-02-11: 20 mg via INTRAVENOUS
  Filled 2015-02-11: qty 2

## 2015-02-11 MED ORDER — FAMOTIDINE 20 MG PO TABS
20.0000 mg | ORAL_TABLET | Freq: Two times a day (BID) | ORAL | Status: DC
  Administered 2015-02-11 – 2015-02-14 (×6): 20 mg via ORAL
  Filled 2015-02-11 (×6): qty 1

## 2015-02-11 MED ORDER — ACETAMINOPHEN 650 MG RE SUPP
650.0000 mg | Freq: Four times a day (QID) | RECTAL | Status: DC | PRN
Start: 2015-02-11 — End: 2015-02-14

## 2015-02-11 MED ORDER — LINAGLIPTIN 5 MG PO TABS
5.0000 mg | ORAL_TABLET | Freq: Every day | ORAL | Status: DC
  Administered 2015-02-12 – 2015-02-14 (×3): 5 mg via ORAL
  Filled 2015-02-11 (×3): qty 1

## 2015-02-11 MED ORDER — CALCIUM GLUCONATE 10 % IV SOLN
1.0000 g | Freq: Once | INTRAVENOUS | Status: AC
  Administered 2015-02-11: 1 g via INTRAVENOUS
  Filled 2015-02-11: qty 10

## 2015-02-11 NOTE — ED Notes (Signed)
Pt sent by Dr. Pearline Cables office. K+ 7.8.

## 2015-02-11 NOTE — ED Notes (Signed)
Patient asymptomatic with a heart rate of 37, however we will move patient moved to a room closer to nurses station.

## 2015-02-11 NOTE — H&P (Signed)
History and Physical  Randall Reyes RWE:[id number] DOB: 1935/09/03 DOA: 02/11/2015  Referring physician: Dr Betsey Holiday, ED physician PCP: [name], MD   Chief Complaint: Elevated potassium  HPI: Randall Reyes is a 79 y.o. male  With a history of diabetes mellitus, hypertension, CLL, mild obesity. Patient was recently hospitalized for hyperkalemia and acute renal insufficiency from 9/20 until 9/22.  Although his metformin was stopped, the patient continued on his ACE inhibitor and diuretic of Maxide and hydrochlorothiazide. The patient went to his family doctor's office for ED follow-up and was found to have hyperkalemia again. The patient denies chest pain, problems breathing, palpitations, fatigue.   Review of Systems:   Pt denies any fevers, chills, nausea, vomiting, diarrhea, constipation, abdominal pain, shortness of breath, dyspnea on exertion, orthopnea, cough, wheezing, palpitations, headache, vision changes, lightheadedness, dizziness, diarrhea, constipation, melena, rectal bleeding.  Review of systems are otherwise negative  Past Medical History  Diagnosis Date  . Diabetes mellitus   . Leukemia 6.22.2012    PER PATIENT  . Hypertension   . Ulcer   . Anemia   . CLL (chronic lymphocytic leukemia) 01/24/2011  . Melanoma in situ 01/24/2011  . DM (diabetes mellitus) 01/24/2011  . Mild obesity 01/24/2011  . Eczema 01/24/2011  . Port catheter in place 05/02/2012  . Shingles   . History of pneumonia 04/2014  . B12 deficiency 12/07/2014   Past Surgical History  Procedure Laterality Date  . Hernia repair  2001  . Bleeding ulcer    . Portacath placement  2008  . Cataract extraction w/phaco  05/05/2012    Procedure: CATARACT EXTRACTION PHACO AND INTRAOCULAR LENS PLACEMENT (IOC);  Surgeon: Tonny Branch, MD;  Location: [facility];  Service: Ophthalmology;  Laterality: Right;  CDE:13.25  . Cataract extraction w/phaco  05/15/2012    Procedure: CATARACT EXTRACTION PHACO AND  INTRAOCULAR LENS PLACEMENT (IOC);  Surgeon: Tonny Branch, MD;  Location: [facility];  Service: Ophthalmology;  Laterality: Left;  CDE:  12.32   Social History:  reports that he has quit smoking. He quit smokeless tobacco use about 47 years ago. He reports that he does not drink alcohol or use illicit drugs. Patient lives at home & is able to participate in activities of daily living  Allergies  Allergen Reactions  . Aspirin Other (See Comments)    Bleeding ulcers.   . Ibuprofen Other (See Comments)    Bleeding ulcers.    Family History  Problem Relation Age of Onset  . Diabetes Father      Prior to Admission medications   Medication Sig Start Date End Date Taking? Authorizing Provider  acetaminophen (TYLENOL) 500 MG tablet Take 500 mg by mouth at bedtime.    Historical Provider, MD  acyclovir (ZOVIRAX) 400 MG tablet Take 1 tablet (400 mg total) by mouth 2 (two) times daily. 03/29/14   Baird Cancer, PA-C  fluocinonide cream (LIDEX) 0.05 % APPLY TO AFFECTED AREA UP TO TWICE A DAY AS NEEDED NOT TO FACE, GROIN, UNDER ARMS 11/25/14   Historical Provider, MD  lansoprazole (PREVACID) 30 MG capsule Take 30 mg by mouth daily. 08/30/14   Historical Provider, MD  lisinopril (PRINIVIL,ZESTRIL) 5 MG tablet Take 5 mg by mouth once a week.  10/25/14   Historical Provider, MD  metFORMIN (GLUCOPHAGE) 500 MG tablet Take 500-1,000 mg by mouth 2 (two) times daily with a meal. 2 tablets in the morning and 1 tablet in the evening.    Historical Provider, MD  ondansetron [facility]) 8  MG tablet Take 1 tablet (8 mg total) by mouth every 8 (eight) hours as needed for nausea or vomiting. Patient not taking: Reported on 12/07/2014 08/19/14   Daleen Bo, MD  ranitidine (ZANTAC) 150 MG tablet Take 150 mg by mouth 3 (three) times daily with meals.    Historical Provider, MD  rosuvastatin (CRESTOR) 20 MG tablet Take 20 mg by mouth daily.     Historical Provider, MD  triamterene-hydrochlorothiazide (MAXZIDE-25) 37.5-25 MG per  tablet Take 1 tablet by mouth daily.  10/05/14   Historical Provider, MD    Physical Exam: BP 111/70 mmHg  Pulse 93  Temp(Src) 98 F (36.7 C)  Resp 16  Ht 5\' 2"  (1.575 m)  Wt 56.246 kg (124 lb)  BMI 22.67 kg/m2  SpO2 90%  General: Elderly Caucasian male. Awake and alert and oriented x3. No acute cardiopulmonary distress.  Eyes: Pupils equal, round, reactive to light. Extraocular muscles are intact. Sclerae anicteric and noninjected.  ENT:  Moist mucosal membranes. No mucosal lesions.   Neck: Neck supple without lymphadenopathy. No carotid bruits. No masses palpated.  Cardiovascular: Regular rate with normal S1-S2 sounds. No murmurs, rubs, gallops auscultated. No JVD.  Respiratory: Good respiratory effort with no wheezes, rales, rhonchi. Lungs clear to auscultation bilaterally.  Abdomen: Soft, nontender, nondistended. Active bowel sounds. No masses or hepatosplenomegaly  Skin: Dry, warm to touch. 2+ dorsalis pedis and radial pulses. Musculoskeletal: No calf or leg pain. All major joints not erythematous nontender.  Psychiatric: Intact judgment and insight.  Neurologic: No focal neurological deficits. Cranial nerves II through XII are grossly intact.           Labs on Admission:  Basic Metabolic Panel:  Recent Labs Lab 02/11/15 1506  NA 137  K 7.2*  CL 113*  CO2 21*  GLUCOSE 123*  BUN 32*  CREATININE 2.25*  CALCIUM 8.7*   Liver Function Tests: No results for input(s): AST, ALT, ALKPHOS, BILITOT, PROT, ALBUMIN in the last 168 hours. No results for input(s): LIPASE, AMYLASE in the last 168 hours. No results for input(s): AMMONIA in the last 168 hours. CBC:  Recent Labs Lab 02/11/15 1530  WBC 13.2*  NEUTROABS 3.3  HGB 10.8*  HCT 34.7*  MCV 104.2*  PLT 159   Cardiac Enzymes: No results for input(s): CKTOTAL, CKMB, CKMBINDEX, TROPONINI in the last 168 hours.  BNP (last 3 results) No results for input(s): BNP in the last 8760 hours.  ProBNP (last 3  results) No results for input(s): PROBNP in the last 8760 hours.  CBG: No results for input(s): GLUCAP in the last 168 hours.  Radiological Exams on Admission: No results found.  EKG: Independently reviewed. Junctional rhythm with a rate of 40. No acute ST changes. Patient was fluid resuscitated and his telemetry now shows sinus rhythm.  Assessment/Plan Present on Admission:  . Hyperkalemia  This patient was discussed with the ED physician, including pertinent vitals, physical exam findings, labs, and imaging.  We also discussed care given by the ED provider.  #1 hyperkalemia  Admit  Telemetry monitoring  We'll give Lasix 20 mg and ED  IV fluid bolus of 1 L +125 mL an hour  Continue Kayexalate 15 mg every 6 hours  Recheck potassium this evening and get a metabolic panel tomorrow morning  Hold diuretic and ACE inhibitor as these are likely the etiology of the patient's hyperkalemia #2 diabetes  Continue Januvia #3 hypertension  Blood pressure low, will hold antihypertensives  DVT prophylaxis: Lovenox  Consultants: None  Code Status: Full code  Family Communication: Wife   Disposition Plan: Admit for telemetry   Truett Mainland, DO [facility] Pager [phone]

## 2015-02-11 NOTE — ED Provider Notes (Signed)
CSN: [id number]     Arrival date & time 02/11/15  1445 History   First MD Initiated Contact with Patient 02/11/15 1501     Chief Complaint  Patient presents with  . Abnormal Lab     (Consider location/radiation/quality/duration/timing/severity/associated sxs/prior Treatment) HPI Comments: Patient referred to the emergency department for evaluation of hyperkalemia. Patient was admitted to the hospital 10 days ago for hyperkalemia secondary to chronic renal failure. He had routine follow-up today at his doctor's office and blood work revealed a potassium of 7.8. Patient is without complaints.   Past Medical History  Diagnosis Date  . Diabetes mellitus   . Leukemia 6.22.2012    PER PATIENT  . Hypertension   . Ulcer   . Anemia   . CLL (chronic lymphocytic leukemia) 01/24/2011  . Melanoma in situ 01/24/2011  . DM (diabetes mellitus) 01/24/2011  . Mild obesity 01/24/2011  . Eczema 01/24/2011  . Port catheter in place 05/02/2012  . Shingles   . History of pneumonia 04/2014  . B12 deficiency 12/07/2014   Past Surgical History  Procedure Laterality Date  . Hernia repair  2001  . Bleeding ulcer    . Portacath placement  2008  . Cataract extraction w/phaco  05/05/2012    Procedure: CATARACT EXTRACTION PHACO AND INTRAOCULAR LENS PLACEMENT (IOC);  Surgeon: Tonny Branch, MD;  Location: [facility];  Service: Ophthalmology;  Laterality: Right;  CDE:13.25  . Cataract extraction w/phaco  05/15/2012    Procedure: CATARACT EXTRACTION PHACO AND INTRAOCULAR LENS PLACEMENT (IOC);  Surgeon: Tonny Branch, MD;  Location: [facility];  Service: Ophthalmology;  Laterality: Left;  CDE:  12.32   Family History  Problem Relation Age of Onset  . Diabetes Father    Social History  Substance Use Topics  . Smoking status: Former Research scientist (life sciences)  . Smokeless tobacco: Former Systems developer    Quit date: 04/25/1967  . Alcohol Use: No    Review of Systems  Respiratory: Negative for shortness of breath.   Cardiovascular: Negative for chest  pain.  All other systems reviewed and are negative.     Allergies  Aspirin and Ibuprofen  Home Medications   Prior to Admission medications   Medication Sig Start Date End Date Taking? Authorizing Provider  acetaminophen (TYLENOL) 500 MG tablet Take 500 mg by mouth at bedtime.    Historical Provider, MD  acyclovir (ZOVIRAX) 400 MG tablet Take 1 tablet (400 mg total) by mouth 2 (two) times daily. 03/29/14   Baird Cancer, PA-C  fluocinonide cream (LIDEX) 0.05 % APPLY TO AFFECTED AREA UP TO TWICE A DAY AS NEEDED NOT TO FACE, GROIN, UNDER ARMS 11/25/14   Historical Provider, MD  lansoprazole (PREVACID) 30 MG capsule Take 30 mg by mouth daily. 08/30/14   Historical Provider, MD  lisinopril (PRINIVIL,ZESTRIL) 5 MG tablet Take 5 mg by mouth once a week.  10/25/14   Historical Provider, MD  metFORMIN (GLUCOPHAGE) 500 MG tablet Take 500-1,000 mg by mouth 2 (two) times daily with a meal. 2 tablets in the morning and 1 tablet in the evening.    Historical Provider, MD  ondansetron (ZOFRAN) 8 MG tablet Take 1 tablet (8 mg total) by mouth every 8 (eight) hours as needed for nausea or vomiting. Patient not taking: Reported on 12/07/2014 08/19/14   Daleen Bo, MD  ranitidine (ZANTAC) 150 MG tablet Take 150 mg by mouth 3 (three) times daily with meals.    Historical Provider, MD  rosuvastatin (CRESTOR) 20 MG tablet Take 20 mg by  mouth daily.     Historical Provider, MD  triamterene-hydrochlorothiazide (MAXZIDE-25) 37.5-25 MG per tablet Take 1 tablet by mouth daily.  10/05/14   Historical Provider, MD   BP 96/53 mmHg  Pulse 44  Temp(Src) 98 F (36.7 C)  Resp 20  Ht 5\' 2"  (1.575 m)  Wt 124 lb (56.246 kg)  BMI 22.67 kg/m2  SpO2 99% Physical Exam  Constitutional: He is oriented to person, place, and time. He appears well-developed and well-nourished. No distress.  HENT:  Head: Normocephalic and atraumatic.  Right Ear: Hearing normal.  Left Ear: Hearing normal.  Nose: Nose normal.  Mouth/Throat:  Oropharynx is clear and moist and mucous membranes are normal.  Eyes: Conjunctivae and EOM are normal. Pupils are equal, round, and reactive to light.  Neck: Normal range of motion. Neck supple.  Cardiovascular: Regular rhythm, S1 normal and S2 normal.  Exam reveals no gallop and no friction rub.   No murmur heard. Pulmonary/Chest: Effort normal and breath sounds normal. No respiratory distress. He exhibits no tenderness.  Abdominal: Soft. Normal appearance and bowel sounds are normal. There is no hepatosplenomegaly. There is no tenderness. There is no rebound, no guarding, no tenderness at McBurney's point and negative Murphy's sign. No hernia.  Musculoskeletal: Normal range of motion.  Neurological: He is alert and oriented to person, place, and time. He has normal strength. No cranial nerve deficit or sensory deficit. Coordination normal. GCS eye subscore is 4. GCS verbal subscore is 5. GCS motor subscore is 6.  Skin: Skin is warm, dry and intact. No rash noted. No cyanosis.  Psychiatric: He has a normal mood and affect. His speech is normal and behavior is normal. Thought content normal.  Nursing note and vitals reviewed.   ED Course  Procedures (including critical care time) Labs Review Labs Reviewed  BASIC METABOLIC PANEL - Abnormal; Notable for the following:    Potassium 7.2 (*)    Chloride 113 (*)    CO2 21 (*)    Glucose, Bld 123 (*)    BUN 32 (*)    Creatinine, Ser 2.25 (*)    Calcium 8.7 (*)    GFR calc non Af Amer 26 (*)    GFR calc Af Amer 31 (*)    Anion gap 3 (*)    All other components within normal limits  CBC WITH DIFFERENTIAL/PLATELET  URINALYSIS, ROUTINE W REFLEX MICROSCOPIC (NOT AT [facility])    Imaging Review No results found. I have personally reviewed and evaluated these images and lab results as part of my medical decision-making.   EKG Interpretation   Date/Time:  Friday February 11 2015 15:03:42 EDT Ventricular Rate:  40 PR Interval:    QRS  Duration: 116 QT Interval:  452 QTC Calculation: 369 R Axis:   -75 Text Interpretation:  Junctional rhythm LAD, consider left anterior  fascicular block Low voltage, precordial leads Confirmed by POLLINA  MD,  CHRISTOPHER ([id number]) on 02/11/2015 3:44:51 PM      MDM   Final diagnoses:  None   hyperkalemia  Patient referred to the ER for evaluation of hyperkalemia. Patient was hospitalized 10 days ago for worsening kidney function and associated hyperkalemia. He had repeat lab work performed today at his doctor's office and was found to have a potassium of 7.8. Patient's EKG does show internal rhythm which is bradycardic, but no other arrhythmia noted. No QRS widening.  Diminished administered calcium, insulin, albuterol, Kayexalate for elevated potassium. He will require hospitalization for further management.  CRITICAL  CARE Performed by: Orpah Greek   Total critical care time: 21min  Critical care time was exclusive of separately billable procedures and treating other patients.  Critical care was necessary to treat or prevent imminent or life-threatening deterioration.  Critical care was time spent personally by me on the following activities: development of treatment plan with patient and/or surrogate as well as nursing, discussions with consultants, evaluation of patient's response to treatment, examination of patient, obtaining history from patient or surrogate, ordering and performing treatments and interventions, ordering and review of laboratory studies, ordering and review of radiographic studies, pulse oximetry and re-evaluation of patient's condition.     Orpah Greek, MD 02/11/15 [phone]

## 2015-02-12 LAB — CBC
HCT: 33.5 % — ABNORMAL LOW (ref 39.0–52.0)
Hemoglobin: 10.3 g/dL — ABNORMAL LOW (ref 13.0–17.0)
MCH: 32.3 pg (ref 26.0–34.0)
MCHC: 30.7 g/dL (ref 30.0–36.0)
MCV: 105 fL — AB (ref 78.0–100.0)
PLATELETS: 138 10*3/uL — AB (ref 150–400)
RBC: 3.19 MIL/uL — ABNORMAL LOW (ref 4.22–5.81)
RDW: 16.6 % — AB (ref 11.5–15.5)
WBC: 7.6 10*3/uL (ref 4.0–10.5)

## 2015-02-12 LAB — BASIC METABOLIC PANEL
Anion gap: 4 — ABNORMAL LOW (ref 5–15)
BUN: 26 mg/dL — AB (ref 6–20)
CHLORIDE: 116 mmol/L — AB (ref 101–111)
CO2: 21 mmol/L — ABNORMAL LOW (ref 22–32)
CREATININE: 1.92 mg/dL — AB (ref 0.61–1.24)
Calcium: 8.5 mg/dL — ABNORMAL LOW (ref 8.9–10.3)
GFR calc Af Amer: 37 mL/min — ABNORMAL LOW (ref >60)
GFR calc non Af Amer: 32 mL/min — ABNORMAL LOW (ref >60)
Glucose, Bld: 105 mg/dL — ABNORMAL HIGH (ref 65–99)
Potassium: 6.5 mmol/L (ref 3.5–5.1)
SODIUM: 141 mmol/L (ref 135–145)

## 2015-02-12 NOTE — Consult Note (Signed)
SIMON LLAMAS MRN: [id number] DOB/AGE: 1936/05/09 79 y.o. Primary Care Physician:MCINNIS,ANGUS G, MD Admit date: 02/11/2015 Chief Complaint:  Chief Complaint  Patient presents with  . Abnormal Lab   HPI: Pt is  49 year ol male with past medical hx of DM who was sent from [place]  office with c/o abnormal labs  HPI dates back to yesterday when pt had his blood drawn and had abnormal labs with high potassium and was sent to ER. Pt was admitted for further care. Pt seen today on 3rd floor. Pt offer no new complaints  NO c/o chest pain/ dyspnea NO c/p palpitation NO c/o fever/cough/chills  NO c/o frequency/urgency/dysuria NO c/o nausea/ vomiting/abdminal pain   Past Medical History  Diagnosis Date  . Diabetes mellitus   . Leukemia 6.22.2012    PER PATIENT  . Hypertension   . Ulcer   . Anemia   . CLL (chronic lymphocytic leukemia) 01/24/2011  . Melanoma in situ 01/24/2011  . DM (diabetes mellitus) 01/24/2011  . Mild obesity 01/24/2011  . Eczema 01/24/2011  . Port catheter in place 05/02/2012  . Shingles   . History of pneumonia 04/2014  . B12 deficiency 12/07/2014     Family History  Problem Relation Age of Onset  . Diabetes Father     Social History:  reports that he has quit smoking. He quit smokeless tobacco use about 47 years ago. He reports that he does not drink alcohol or use illicit drugs.   Allergies:  Allergies  Allergen Reactions  . Aspirin Other (See Comments)    Bleeding ulcers.   . Ibuprofen Other (See Comments)    Bleeding ulcers.    Medications Prior to Admission  Medication Sig Dispense Refill  . acetaminophen (TYLENOL) 500 MG tablet Take 500 mg by mouth at bedtime.    [name] acyclovir (ZOVIRAX) 400 MG tablet Take 1 tablet (400 mg total) by mouth 2 (two) times daily. (Patient taking differently: Take 400 mg by mouth daily. ) 60 tablet 6  . fluocinonide cream (LIDEX) 0.05 % APPLY TO AFFECTED AREA UP TO TWICE A DAY AS NEEDED NOT TO FACE, GROIN,  UNDER ARMS  3  . lansoprazole (PREVACID) 30 MG capsule Take 30 mg by mouth daily.  0  . lisinopril (PRINIVIL,ZESTRIL) 5 MG tablet Take 5 mg by mouth daily.   3  . rosuvastatin (CRESTOR) 20 MG tablet Take 20 mg by mouth daily.     . sitaGLIPtin (JANUVIA) 100 MG tablet Take 100 mg by mouth daily.    [name] triamterene-hydrochlorothiazide (MAXZIDE-25) 37.5-25 MG per tablet Take 1 tablet by mouth daily.   2       SUP:JSRPR from the symptoms mentioned above,there are no other symptoms referable to all systems reviewed.  [name] acyclovir  400 mg Oral BID  . enoxaparin (LOVENOX) injection  30 mg Subcutaneous Q24H  . famotidine  20 mg Oral BID  . linagliptin  5 mg Oral Daily  . pantoprazole  40 mg Oral Daily  . rosuvastatin  20 mg Oral q1800  . sodium chloride  3 mL Intravenous Q12H  . sodium polystyrene  15 g Oral Q6H      Physical Exam: Vital signs in last 24 hours: Temp:  [97.4 F (36.3 C)-98 F (36.7 C)] 97.9 F (36.6 C) (10/01 0616) Pulse Rate:  [38-97] 55 (10/01 0616) Resp:  [13-20] 17 (10/01 0616) BP: (84-122)/(49-70) 122/56 mmHg (10/01 0811) SpO2:  [90 %-100 %] 98 % (10/01 0616) Weight:  [124 lb (  56.246 kg)] 124 lb (56.246 kg) (09/30 1448) Weight change:  Last BM Date: 02/12/15  Intake/Output from previous day: 09/30 0701 - 10/01 0700 In: -  Out: 800 [Urine:800] Total I/O In: 120 [P.O.:120] Out: 500 [Urine:500]   Physical Exam: General- pt is awake,alert, oriented to time place and person Resp- No acute REsp distress, CTA B/L NO Rhonchi CVS- S1S2 regular ij rate and rhythm GIT- BS+, soft, NT, ND EXT- NO LE Edema, Cyanosis CNS- CN 2-12 grossly intact. Moving all 4 extremities Psych- normal mood and affect    Lab Results: CBC  Recent Labs  02/11/15 1530 02/12/15 0602  WBC 13.2* 7.6  HGB 10.8* 10.3*  HCT 34.7* 33.5*  PLT 159 138*    BMET  Recent Labs  02/11/15 1506 02/11/15 1935 02/12/15 0602  NA 137  --  141  K 7.2* 6.0* 6.5*  CL 113*  --  116*   CO2 21*  --  21*  GLUCOSE 123*  --  105*  BUN 32*  --  26*  CREATININE 2.25*  --  1.92*  CALCIUM 8.7*  --  8.5*   Creat trend 2016  2.25=>1.92          1.99=>1.56 ( AKI in Sept)           1.4--  2.44 ( AKI in March)  2015   1.4--2.23 2014    1.4 2013    1.2--1.4 2012    1.4--2.0   Potassium 2016  7.8( as outpt)=> 6.5   MICRO No results found for this or any previous visit (from the past 240 hour(s)).    Lab Results  Component Value Date   PTH 24 02/03/2015   PTH Comment 02/03/2015   CALCIUM 8.5* 02/12/2015   PHOS 4.0 02/03/2015    Renal U/s done in 02/02/15 Right  8.4cm Left  8.3 cm  Impression: 1)Renal  AKI secondary to Pre renal/ATN                AKI on CKD                AKI now improving               CKD stage 3.               CKD since 2013               CKD secondary to HTN/age associated decline                Progression of CKD  Multiple AKI                Proteinura -bence jones proteinuria                Hematuria none .                Nephrolithiasis Hx Absent   2)CVS- BP stable  Medication as outpt-pt was on  On RAAS blockers On potassium sparing Diuretics-  3)Anemia HGb stable  4)CKD Mineral-Bone Disorder PTH at goal Secondary Hyperparathyroidism absent Phosphorus  At goal  5)Oncology- hx of CLL     M spike + in Urine-bence jones proteinuria     High Free kappa/lambda ratio Primary MD and Oncology  following  6)Electrolytes  Hyperkalemic          AKI + CKD + RAAS + Triamterene       Potassium trending down       received Kayexalate +  lasix + calcium gluconate  NOrmonatremic   7)Acid base Co2 just at goal     Plan:  Agree with kayexalate Agree with ;lasix Will suggest to change diet to low K diet Agree with holding ace & and triamterene Will follow bmet  No need of renal replacement therapy to help with hyperkalemia     Braelen Sproule S 02/12/2015, 11:10 AM

## 2015-02-12 NOTE — Progress Notes (Signed)
Subjective: The patient had several episodes of diarrhea through the night . He was admitted with markedly elevated potassium level. He has received several doses of Kayexalate Objective: Vital signs in last 24 hours: Temp:  [97.4 F (36.3 C)-98 F (36.7 C)] 97.9 F (36.6 C) (10/01 0616) Pulse Rate:  [38-97] 55 (10/01 0616) Resp:  [13-20] 17 (10/01 0616) BP: (84-122)/(49-70) 122/56 mmHg (10/01 0811) SpO2:  [90 %-100 %] 98 % (10/01 0616) Weight:  [56.246 kg (124 lb)] 56.246 kg (124 lb) (09/30 1448) Weight change:  Last BM Date: 02/11/15  Intake/Output from previous day: 09/30 0701 - 10/01 0700 In: -  Out: 800 [Urine:800] Intake/Output this shift:    Physical Exam: Gen. appearance the patient is alert and oriented  HEENT negative  Neck supple no nodes or thyroid abnormalities  Heart regular rhythm no murmurs  Lungs clear to P&A  Abdomen no palpable organs or masses   Recent Labs  02/11/15 1530 02/12/15 0602  WBC 13.2* 7.6  HGB 10.8* 10.3*  HCT 34.7* 33.5*  PLT 159 138*   BMET  Recent Labs  02/11/15 1506 02/11/15 1935 02/12/15 0602  NA 137  --  141  K 7.2* 6.0* 6.5*  CL 113*  --  116*  CO2 21*  --  21*  GLUCOSE 123*  --  105*  BUN 32*  --  26*  CREATININE 2.25*  --  1.92*  CALCIUM 8.7*  --  8.5*    Studies/Results: No results found.  Medications:  . acyclovir  400 mg Oral BID  . enoxaparin (LOVENOX) injection  30 mg Subcutaneous Q24H  . famotidine  20 mg Oral BID  . linagliptin  5 mg Oral Daily  . pantoprazole  40 mg Oral Daily  . rosuvastatin  20 mg Oral q1800  . sodium chloride  3 mL Intravenous Q12H  . sodium polystyrene  15 g Oral Q6H    . sodium chloride       Assessment/Plan: 1. Markedly elevated serum potassium-plan to continue to monitor will obtain nephrology consult  2. Diabetes mellitus-continue Januvia  3. Acute kidney injury-improvement noted with IV fluids  LOS: 1 day   Glenard Keesling G 02/12/2015, 8:40 AM

## 2015-02-13 LAB — BASIC METABOLIC PANEL
Anion gap: 2 — ABNORMAL LOW (ref 5–15)
BUN: 15 mg/dL (ref 6–20)
CO2: 20 mmol/L — ABNORMAL LOW (ref 22–32)
Calcium: 7.7 mg/dL — ABNORMAL LOW (ref 8.9–10.3)
Chloride: 120 mmol/L — ABNORMAL HIGH (ref 101–111)
Creatinine, Ser: 1.59 mg/dL — ABNORMAL HIGH (ref 0.61–1.24)
GFR calc Af Amer: 47 mL/min — ABNORMAL LOW (ref >60)
GFR calc non Af Amer: 40 mL/min — ABNORMAL LOW (ref >60)
Glucose, Bld: 95 mg/dL (ref 65–99)
Potassium: 4.9 mmol/L (ref 3.5–5.1)
Sodium: 142 mmol/L (ref 135–145)

## 2015-02-13 NOTE — Progress Notes (Signed)
Per Dr. Anastasio Champion, not necessary to give any more doses of kayexalate at this time since patient's K+ is WDL

## 2015-02-13 NOTE — Progress Notes (Signed)
  Randall Reyes JXB:[id number] DOB: July 12, 1935 DOA: 02/11/2015 PCP: [name], MD   Subjective: Patient is feeling much better. His potassium is now normal. He has no symptoms such as chest pain, palpitations, dyspnea or weakness. He has been mobilizing.           Physical Exam: Blood pressure 145/77, pulse 64, temperature 97.7 F (36.5 C), temperature source Oral, resp. rate 18, height 5\' 2"  (1.575 m), weight 56.246 kg (124 lb), SpO2 100 %. He looks systemically well he appears to be in sinus rhythm. Lung fields are clear. He is alert and orientated without any focal neurological signs.   Investigations:  No results found for this or any previous visit (from the past 240 hour(s)).   Basic Metabolic Panel:  Recent Labs  02/12/15 0602 02/13/15 0603  NA 141 142  K 6.5* 4.9  CL 116* 120*  CO2 21* 20*  GLUCOSE 105* 95  BUN 26* 15  CREATININE 1.92* 1.59*  CALCIUM 8.5* 7.7*   Liver Function Tests: No results for input(s): AST, ALT, ALKPHOS, BILITOT, PROT, ALBUMIN in the last 72 hours.   CBC:  Recent Labs  02/11/15 1530 02/12/15 0602  WBC 13.2* 7.6  NEUTROABS 3.3  --   HGB 10.8* 10.3*  HCT 34.7* 33.5*  MCV 104.2* 105.0*  PLT 159 138*    No results found.    Medications: I have reviewed the patient's current medications.  Impression: 1. Hyperkalemia. 2. Acute on chronic kidney disease. 3. Diabetes. 4. Hypertension.     Plan: 1. Continue with current therapy under the direction of nephrology. 2. He should be able to be discharged home soon as his potassium is now returned back to normal.  Consultants:  Nephrology.   Procedures:  None.   Antibiotics:  None.                    Family Communication: I discussed the plan with the patient at the bedside.   Disposition Plan: Home when medically stable.  Time spent: 15 minutes.   LOS: 2 days   Sharon C   02/13/2015, 11:53 AM

## 2015-02-13 NOTE — Progress Notes (Addendum)
Subjective: Patient is feeling much better. Presently he denies any nausea or vomiting. He no difficulty breathing.   Objective: Vital signs in last 24 hours: Temp:  [97.5 F (36.4 C)-98 F (36.7 C)] 97.7 F (36.5 C) (10/02 0630) Pulse Rate:  [64-76] 64 (10/02 0630) Resp:  [18] 18 (10/02 0630) BP: (132-146)/(71-78) 145/77 mmHg (10/02 0630) SpO2:  [100 %] 100 % (10/02 0630)  Intake/Output from previous day: 10/01 0701 - 10/02 0700 In: 2209.2 [P.O.:480; I.V.:1729.2] Out: 1500 [Urine:1500] Intake/Output this shift:     Recent Labs  02/11/15 1530 02/12/15 0602  HGB 10.8* 10.3*    Recent Labs  02/11/15 1530 02/12/15 0602  WBC 13.2* 7.6  RBC 3.33* 3.19*  HCT 34.7* 33.5*  PLT 159 138*    Recent Labs  02/12/15 0602 02/13/15 0603  NA 141 142  K 6.5* 4.9  CL 116* 120*  CO2 21* 20*  BUN 26* 15  CREATININE 1.92* 1.59*  GLUCOSE 105* 95  CALCIUM 8.5* 7.7*   No results for input(s): LABPT, INR in the last 72 hours.  Generally patient is a left hand in upper and distress Chest is clear to auscultation His heart exam revealed regular rate and rhythm no murmur or S3 Extremities no edema  Assessment/Plan: Problem #1 acute kidney injury superimposed on chronic. Presently his renal function is improving. Patient is nonoliguric and is asymptomatic. Problem #2 hyperkalemia: His potassium is presently normal. This was thought to be secondary to worsening of his renal failure/RAAS/Triameterne. Problem #3 chronic renal failure: Stage III. Taught to be  secondary to diabetes/hypertension/age-related decline. Problem #4 anemia: His hemoglobin is within our target goal. Problem #5 diabetes Problem #6 history of chronic lymphocytic ischemia Plan: We'll continue his present management We'll check his basic metabolic panel in the morning.   Chayah Mckee S 02/13/2015, 9:23 AM

## 2015-02-14 LAB — BASIC METABOLIC PANEL
ANION GAP: 3 — AB (ref 5–15)
BUN: 11 mg/dL (ref 6–20)
CO2: 20 mmol/L — ABNORMAL LOW (ref 22–32)
Calcium: 7.6 mg/dL — ABNORMAL LOW (ref 8.9–10.3)
Chloride: 120 mmol/L — ABNORMAL HIGH (ref 101–111)
Creatinine, Ser: 1.49 mg/dL — ABNORMAL HIGH (ref 0.61–1.24)
GFR calc Af Amer: 50 mL/min — ABNORMAL LOW (ref >60)
GFR, EST NON AFRICAN AMERICAN: 44 mL/min — AB (ref >60)
Glucose, Bld: 88 mg/dL (ref 65–99)
POTASSIUM: 3.8 mmol/L (ref 3.5–5.1)
SODIUM: 143 mmol/L (ref 135–145)

## 2015-02-14 LAB — PHOSPHORUS: PHOSPHORUS: 4.1 mg/dL (ref 2.5–4.6)

## 2015-02-14 MED ORDER — RANITIDINE HCL 150 MG PO TABS: 150.0000 mg | ORAL_TABLET | Freq: Two times a day (BID) | ORAL | Status: DC

## 2015-02-14 NOTE — Progress Notes (Signed)
Subjective: Patient offers no complaint.  .   Objective: Vital signs in last 24 hours: Temp:  [97.6 F (36.4 C)-99.1 F (37.3 C)] 97.6 F (36.4 C) (10/03 0617) Pulse Rate:  [50-59] 50 (10/03 0617) Resp:  [18] 18 (10/03 0617) BP: (106-120)/(59-61) 120/60 mmHg (10/03 0617) SpO2:  [98 %-100 %] 98 % (10/03 0617)  Intake/Output from previous day: 10/02 0701 - 10/03 0700 In: 2105 [P.O.:480; I.V.:1625] Out: 800 [Urine:800] Intake/Output this shift:     Recent Labs  02/11/15 1530 02/12/15 0602  HGB 10.8* 10.3*    Recent Labs  02/11/15 1530 02/12/15 0602  WBC 13.2* 7.6  RBC 3.33* 3.19*  HCT 34.7* 33.5*  PLT 159 138*    Recent Labs  02/13/15 0603 02/14/15 0559  NA 142 143  K 4.9 3.8  CL 120* 120*  CO2 20* 20*  BUN 15 11  CREATININE 1.59* 1.49*  GLUCOSE 95 88  CALCIUM 7.7* 7.6*   No results for input(s): LABPT, INR in the last 72 hours.  Generally patient is a left hand in upper and distress Chest is clear to auscultation His heart exam revealed regular rate and rhythm no murmur or S3 Extremities no edema  Assessment/Plan: Problem #1 acute kidney injury superimposed on chronic. His renal function continue to improve and has returned to his base line Problem #2 hyperkalemia: His potassium is presently normal. This was thought to be secondary to worsening of his renal failure/RAAS/Triameterne. Problem #3 chronic renal failure: Stage III. Taught to be secondary to diabetes/hypertension/age-related decline. Problem #4 anemia: His hemoglobin is within our target goal. Problem #5 diabetes Problem #6 history of chronic lymphocytic ischemia Plan:1] We'll d/c ivf and encourage po fluid intake 2]We'll check his basic metabolic panel in the morning. 3]Advised to keep his regular appointment   [facility] S 02/14/2015, 7:56 AM

## 2015-02-14 NOTE — Progress Notes (Signed)
Discharge instructions given, verbalized understanding, out in stable condition with staff. 

## 2015-02-14 NOTE — Care Management Note (Signed)
Case Management Note  Patient Details  Name: Randall Reyes MRN: [id number] Date of Birth: October 23, 1935  Expected Discharge Date:   02/14/2015               Expected Discharge Plan:  Home/Self Care  In-House Referral:  NA  Discharge planning Services  CM Consult  Post Acute Care Choice:  NA Choice offered to:  NA  DME Arranged:    DME Agency:     HH Arranged:    [place] Agency:     Status of Service:  Completed, signed off  Medicare Important Message Given:    Date Medicare IM Given:    Medicare IM give by:    Date Additional Medicare IM Given:    Additional Medicare Important Message give by:     If discussed at [place] of Stay Meetings, dates discussed:    Additional Comments: Pt admitted with hyperkalemia. Pt from home, lives with wife and is ind at baseline. Pt has cane for PRN use. Pt has no HH services, DME or med needs prior to admission. Pt plans to discharge home with self care. Pt hopeful for DC this afternoon.  Sherald Barge, RN 02/14/2015, 1:20 PM

## 2015-02-14 NOTE — Discharge Summary (Signed)
Physician Discharge Summary  Randall Reyes CLE:[id number] DOB: 1935/10/26 DOA: 02/11/2015  PCP: [name], MD  Admit date: 02/11/2015 Discharge date: 02/14/2015     Discharge Diagnoses:  1. Acute kidney injury 2. Hyperkalemia 3. Non-insulin-dependent diabetes 4. Chronic lymphocytic leukemia 5. Anemia 6. Essential hypertension      Discharge Condition: Stable Disposition: Home  Diet recommendation: 2000-calorie ADA diet  Filed Weights   02/11/15 1448  Weight: 56.246 kg (124 lb)    History of present illness:  The patient was admitted through ED after having presented there with elevated serum potassium. Patient has a history of diabetes mellitus hypertension CLL. He had been recently hospitalized with hyperkalemia and acute renal insufficiency. He was found to have potassium level of 7.5. Patient was treated in ED and subsequently admitted  Hospital Course:  Examination on admission revealed alert patient with out complaints. Heart regular rhythm, lungs clear to P&A, abdomen the palpable organs or masses. Shortly after admission the patient was given intravenous fluids and Kayexalate. Electrocardiogram showed no acute changes. His ACE inhibitor and diaphoretic was withheld. He is continued on Kayexalate 15 mg every 6 hours. His potassium initially was 6.5 and after treatment was 3.8 his creatinine on admission was 1.92 and subsequent 1.49 glucose ranged from 105-88. He was seen by nephrology who agreed that he had acute kidney injury superimposed on chronic. Intelligence renal function was improving with intravenous fluids. Is felt that he could be discharged.   Discharge Instructions The patient is to continue medications listed below and advised to follow-up with primary care physician in one to 2 days. He'll be seen as well by oncology    Medication List    STOP taking these medications        acyclovir 400 MG tablet  Commonly known as:  ZOVIRAX     lisinopril 5 MG tablet  Commonly known as:  PRINIVIL,ZESTRIL     triamterene-hydrochlorothiazide 37.5-25 MG tablet  Commonly known as:  MAXZIDE-25      TAKE these medications        acetaminophen 500 MG tablet  Commonly known as:  TYLENOL  Take 500 mg by mouth at bedtime.     fluocinonide cream 0.05 %  Commonly known as:  LIDEX  APPLY TO AFFECTED AREA UP TO TWICE A DAY AS NEEDED NOT TO FACE, GROIN, UNDER ARMS     lansoprazole 30 MG capsule  Commonly known as:  PREVACID  Take 30 mg by mouth daily.     ranitidine 150 MG tablet  Commonly known as:  ZANTAC  Take 1 tablet (150 mg total) by mouth 2 (two) times daily.     rosuvastatin 20 MG tablet  Commonly known as:  CRESTOR  Take 20 mg by mouth daily.     sitaGLIPtin 100 MG tablet  Commonly known as:  JANUVIA  Take 100 mg by mouth daily.       Allergies  Allergen Reactions  . Aspirin Other (See Comments)    Bleeding ulcers.   . Ibuprofen Other (See Comments)    Bleeding ulcers.    The results of significant diagnostics from this hospitalization (including imaging, microbiology, ancillary and laboratory) are listed below for reference.    Significant Diagnostic Studies: US Renal  02/02/2015   CLINICAL DATA:  Acute tubular necrosis.  EXAM: RENAL / URINARY TRACT ULTRASOUND COMPLETE  COMPARISON:  None.  FINDINGS: Right Kidney:  Length: 8.4 cm. Echogenicity within normal limits. No mass or hydronephrosis visualized.  Left Kidney:  Length: 8.3 cm. Echogenicity within normal limits. No mass or hydronephrosis visualized.  Bladder:  Appears normal for degree of bladder distention.  IMPRESSION: Bilateral renal atrophy is noted. No hydronephrosis or renal obstruction is noted.   Electronically Signed   By: Marijo Conception, M.D.   On: 02/02/2015 15:33    Microbiology: No results found for this or any previous visit (from the past 240 hour(s)).   Labs: Basic Metabolic Panel:  Recent Labs Lab 02/11/15 1506 02/11/15 1935  02/12/15 0602 02/13/15 0603 02/14/15 0559  NA 137  --  141 142 143  K 7.2* 6.0* 6.5* 4.9 3.8  CL 113*  --  116* 120* 120*  CO2 21*  --  21* 20* 20*  GLUCOSE 123*  --  105* 95 88  BUN 32*  --  26* 15 11  CREATININE 2.25*  --  1.92* 1.59* 1.49*  CALCIUM 8.7*  --  8.5* 7.7* 7.6*  PHOS  --   --   --   --  4.1   Liver Function Tests: No results for input(s): AST, ALT, ALKPHOS, BILITOT, PROT, ALBUMIN in the last 168 hours. No results for input(s): LIPASE, AMYLASE in the last 168 hours. No results for input(s): AMMONIA in the last 168 hours. CBC:  Recent Labs Lab 02/11/15 1530 02/12/15 0602  WBC 13.2* 7.6  NEUTROABS 3.3  --   HGB 10.8* 10.3*  HCT 34.7* 33.5*  MCV 104.2* 105.0*  PLT 159 138*   Cardiac Enzymes: No results for input(s): CKTOTAL, CKMB, CKMBINDEX, TROPONINI in the last 168 hours. BNP: BNP (last 3 results) No results for input(s): BNP in the last 8760 hours.  ProBNP (last 3 results) No results for input(s): PROBNP in the last 8760 hours.  CBG: No results for input(s): GLUCAP in the last 168 hours.  Active Problems:   Hyperkalemia   Time coordinating discharge: 30 minutes Signed:  Marjean Donna, MD 02/14/2015, 5:10 PM

## 2015-02-14 NOTE — Care Management Important Message (Signed)
Important Message  Patient Details  Name: EDOARDO LAFORTE MRN: [id number] Date of Birth: Sep 19, 1935   Medicare Important Message Given:  Yes-second notification given    Sherald Barge, RN 02/14/2015, 1:22 PM

## 2015-02-28 ENCOUNTER — Other Ambulatory Visit: Payer: Self-pay

## 2015-02-28 ENCOUNTER — Inpatient Hospital Stay (HOSPITAL_COMMUNITY)
Admission: EM | Admit: 2015-02-28 | Discharge: 2015-03-03 | DRG: 641 | Disposition: A | Discharge status: Home / Self Care | Payer: Medicare Other | Attending: Family Medicine | Admitting: Family Medicine

## 2015-02-28 ENCOUNTER — Encounter (HOSPITAL_COMMUNITY): Payer: Medicare Other | Attending: Oncology

## 2015-02-28 ENCOUNTER — Encounter (HOSPITAL_COMMUNITY): Payer: Self-pay | Admitting: Emergency Medicine

## 2015-02-28 ENCOUNTER — Encounter (HOSPITAL_BASED_OUTPATIENT_CLINIC_OR_DEPARTMENT_OTHER): Payer: Medicare Other

## 2015-02-28 ENCOUNTER — Other Ambulatory Visit (HOSPITAL_COMMUNITY): Payer: Self-pay

## 2015-02-28 DIAGNOSIS — I129 Hypertensive chronic kidney disease with stage 1 through stage 4 chronic kidney disease, or unspecified chronic kidney disease: Secondary | ICD-10-CM | POA: Diagnosis present

## 2015-02-28 DIAGNOSIS — N183 Chronic kidney disease, stage 3 (moderate): Secondary | ICD-10-CM | POA: Diagnosis present

## 2015-02-28 DIAGNOSIS — Z87891 Personal history of nicotine dependence: Secondary | ICD-10-CM | POA: Diagnosis not present

## 2015-02-28 DIAGNOSIS — Z833 Family history of diabetes mellitus: Secondary | ICD-10-CM | POA: Diagnosis not present

## 2015-02-28 DIAGNOSIS — E1122 Type 2 diabetes mellitus with diabetic chronic kidney disease: Secondary | ICD-10-CM | POA: Diagnosis present

## 2015-02-28 DIAGNOSIS — M310 Hypersensitivity angiitis: Secondary | ICD-10-CM | POA: Insufficient documentation

## 2015-02-28 DIAGNOSIS — N179 Acute kidney failure, unspecified: Secondary | ICD-10-CM | POA: Diagnosis present

## 2015-02-28 DIAGNOSIS — E875 Hyperkalemia: Secondary | ICD-10-CM | POA: Diagnosis present

## 2015-02-28 DIAGNOSIS — D649 Anemia, unspecified: Secondary | ICD-10-CM | POA: Diagnosis present

## 2015-02-28 DIAGNOSIS — E538 Deficiency of other specified B group vitamins: Secondary | ICD-10-CM | POA: Diagnosis not present

## 2015-02-28 DIAGNOSIS — E119 Type 2 diabetes mellitus without complications: Secondary | ICD-10-CM | POA: Diagnosis not present

## 2015-02-28 DIAGNOSIS — Z85820 Personal history of malignant melanoma of skin: Secondary | ICD-10-CM

## 2015-02-28 DIAGNOSIS — C9110 Chronic lymphocytic leukemia of B-cell type not having achieved remission: Secondary | ICD-10-CM | POA: Diagnosis present

## 2015-02-28 DIAGNOSIS — Z7984 Long term (current) use of oral hypoglycemic drugs: Secondary | ICD-10-CM

## 2015-02-28 DIAGNOSIS — I10 Essential (primary) hypertension: Secondary | ICD-10-CM

## 2015-02-28 DIAGNOSIS — D519 Vitamin B12 deficiency anemia, unspecified: Secondary | ICD-10-CM

## 2015-02-28 LAB — GLUCOSE, CAPILLARY
Glucose-Capillary: 45 mg/dL — ABNORMAL LOW (ref 65–99)
Glucose-Capillary: 75 mg/dL (ref 65–99)
Glucose-Capillary: 100 mg/dL — ABNORMAL HIGH (ref 65–99)

## 2015-02-28 LAB — CBC WITH DIFFERENTIAL/PLATELET
Basophils Absolute: 0 10*3/uL (ref 0.0–0.1)
Basophils Relative: 0 %
EOS ABS: 0.8 10*3/uL — AB (ref 0.0–0.7)
Eosinophils Relative: 5 %
HEMATOCRIT: 36.4 % — AB (ref 39.0–52.0)
HEMOGLOBIN: 11.8 g/dL — AB (ref 13.0–17.0)
LYMPHS ABS: 8.1 10*3/uL — AB (ref 0.7–4.0)
LYMPHS PCT: 52 %
MCH: 33.3 pg (ref 26.0–34.0)
MCHC: 32.4 g/dL (ref 30.0–36.0)
MCV: 102.8 fL — AB (ref 78.0–100.0)
MONOS PCT: 17 %
Monocytes Absolute: 2.6 10*3/uL — ABNORMAL HIGH (ref 0.1–1.0)
NEUTROS ABS: 4 10*3/uL (ref 1.7–7.7)
NEUTROS PCT: 26 %
PLATELETS: 188 10*3/uL (ref 150–400)
RBC: 3.54 MIL/uL — AB (ref 4.22–5.81)
RDW: 15.3 % (ref 11.5–15.5)
WBC: 15.5 10*3/uL — AB (ref 4.0–10.5)

## 2015-02-28 LAB — COMPREHENSIVE METABOLIC PANEL
ALK PHOS: 77 U/L (ref 38–126)
ALT: 13 U/L — AB (ref 17–63)
AST: 20 U/L (ref 15–41)
Albumin: 3.6 g/dL (ref 3.5–5.0)
Anion gap: 2 — ABNORMAL LOW (ref 5–15)
BILIRUBIN TOTAL: 0.3 mg/dL (ref 0.3–1.2)
BUN: 38 mg/dL — AB (ref 6–20)
CALCIUM: 8.8 mg/dL — AB (ref 8.9–10.3)
CHLORIDE: 106 mmol/L (ref 101–111)
CO2: 24 mmol/L (ref 22–32)
CREATININE: 2.64 mg/dL — AB (ref 0.61–1.24)
GFR, EST AFRICAN AMERICAN: 25 mL/min — AB (ref >60)
GFR, EST NON AFRICAN AMERICAN: 22 mL/min — AB (ref >60)
Glucose, Bld: 113 mg/dL — ABNORMAL HIGH (ref 65–99)
Potassium: 7.6 mmol/L (ref 3.5–5.1)
Sodium: 132 mmol/L — ABNORMAL LOW (ref 135–145)
Total Protein: 8.6 g/dL — ABNORMAL HIGH (ref 6.5–8.1)

## 2015-02-28 LAB — POTASSIUM: POTASSIUM: 6.9 mmol/L — AB (ref 3.5–5.1)

## 2015-02-28 MED ORDER — ONDANSETRON HCL 4 MG PO TABS: 4.0000 mg | ORAL_TABLET | Freq: Four times a day (QID) | ORAL | Status: DC | PRN

## 2015-02-28 MED ORDER — ROSUVASTATIN CALCIUM 20 MG PO TABS: 20.0000 mg | ORAL_TABLET | Freq: Every day | ORAL | Status: DC

## 2015-02-28 MED ORDER — ENOXAPARIN SODIUM 30 MG/0.3ML ~~LOC~~ SOLN
30.0000 mg | SUBCUTANEOUS | Status: DC
Start: 2015-02-28 — End: 2015-03-03
  Administered 2015-02-28 – 2015-03-02 (×3): 30 mg via SUBCUTANEOUS
  Filled 2015-02-28 (×3): qty 0.3

## 2015-02-28 MED ORDER — SODIUM CHLORIDE 0.9 % IJ SOLN
3.0000 mL | Freq: Two times a day (BID) | INTRAMUSCULAR | Status: DC
  Administered 2015-02-28 – 2015-03-02 (×3): 3 mL via INTRAVENOUS

## 2015-02-28 MED ORDER — CYANOCOBALAMIN 1000 MCG/ML IJ SOLN
1000.0000 ug | Freq: Once | INTRAMUSCULAR | Status: AC
  Administered 2015-02-28: 1000 ug via INTRAMUSCULAR

## 2015-02-28 MED ORDER — SODIUM BICARBONATE 8.4 % IV SOLN
50.0000 meq | Freq: Once | INTRAVENOUS | Status: AC
  Administered 2015-02-28: 50 meq via INTRAVENOUS
  Filled 2015-02-28: qty 50

## 2015-02-28 MED ORDER — SODIUM CHLORIDE 0.9 % IV BOLUS (SEPSIS)
1000.0000 mL | Freq: Once | INTRAVENOUS | Status: AC
  Administered 2015-02-28: 1000 mL via INTRAVENOUS

## 2015-02-28 MED ORDER — SODIUM CHLORIDE 0.9 % IV SOLN
INTRAVENOUS | Status: DC
  Administered 2015-02-28 – 2015-03-03 (×6): via INTRAVENOUS

## 2015-02-28 MED ORDER — INSULIN ASPART 100 UNIT/ML ~~LOC~~ SOLN: 0.0000 [IU] | Freq: Every day | SUBCUTANEOUS | Status: DC

## 2015-02-28 MED ORDER — LINAGLIPTIN 5 MG PO TABS
5.0000 mg | ORAL_TABLET | Freq: Every day | ORAL | Status: DC
  Administered 2015-03-01 – 2015-03-02 (×2): 5 mg via ORAL
  Filled 2015-02-28 (×2): qty 1

## 2015-02-28 MED ORDER — INSULIN ASPART 100 UNIT/ML ~~LOC~~ SOLN
SUBCUTANEOUS | Status: AC
Start: 2015-02-28 — End: 2015-03-01
  Filled 2015-02-28: qty 1

## 2015-02-28 MED ORDER — INSULIN ASPART 100 UNIT/ML IV SOLN
10.0000 [IU] | Freq: Once | INTRAVENOUS | Status: AC
  Administered 2015-02-28: 10 [IU] via INTRAVENOUS

## 2015-02-28 MED ORDER — ACETAMINOPHEN 500 MG PO TABS
500.0000 mg | ORAL_TABLET | Freq: Every day | ORAL | Status: DC
  Administered 2015-02-28 – 2015-03-02 (×3): 500 mg via ORAL
  Filled 2015-02-28 (×3): qty 1

## 2015-02-28 MED ORDER — PANTOPRAZOLE SODIUM 20 MG PO TBEC
20.0000 mg | DELAYED_RELEASE_TABLET | Freq: Every day | ORAL | Status: DC
  Filled 2015-02-28 (×2): qty 1

## 2015-02-28 MED ORDER — ENOXAPARIN SODIUM 30 MG/0.3ML ~~LOC~~ SOLN: 30.0000 mg | SUBCUTANEOUS | Status: DC

## 2015-02-28 MED ORDER — CYANOCOBALAMIN 1000 MCG/ML IJ SOLN
INTRAMUSCULAR | Status: AC
Start: 2015-02-28 — End: 2015-02-28
  Filled 2015-02-28: qty 1

## 2015-02-28 MED ORDER — INSULIN ASPART 100 UNIT/ML ~~LOC~~ SOLN: 0.0000 [IU] | Freq: Three times a day (TID) | SUBCUTANEOUS | Status: DC

## 2015-02-28 MED ORDER — CALCIUM GLUCONATE 10 % IV SOLN
1.0000 g | Freq: Once | INTRAVENOUS | Status: AC
  Administered 2015-02-28: 1 g via INTRAVENOUS
  Filled 2015-02-28: qty 10

## 2015-02-28 MED ORDER — SODIUM CHLORIDE 0.9 % IV BOLUS (SEPSIS)
500.0000 mL | Freq: Once | INTRAVENOUS | Status: AC
Start: 2015-02-28 — End: 2015-02-28
  Administered 2015-02-28: 500 mL via INTRAVENOUS

## 2015-02-28 MED ORDER — DEXTROSE 50 % IV SOLN
50.0000 mL | Freq: Once | INTRAVENOUS | Status: AC
  Administered 2015-02-28: 50 mL via INTRAVENOUS

## 2015-02-28 MED ORDER — SODIUM POLYSTYRENE SULFONATE 15 GM/60ML PO SUSP
30.0000 g | Freq: Once | ORAL | Status: AC
  Administered 2015-02-28: 30 g via ORAL
  Filled 2015-02-28: qty 120

## 2015-02-28 MED ORDER — FLUOCINONIDE 0.05 % EX CREA
TOPICAL_CREAM | Freq: Two times a day (BID) | CUTANEOUS | Status: DC
  Administered 2015-03-01: 1 via TOPICAL
  Administered 2015-03-02: 20:00:00 via TOPICAL
  Filled 2015-02-28: qty 30

## 2015-02-28 MED ORDER — DEXTROSE 50 % IV SOLN
INTRAVENOUS | Status: AC
  Filled 2015-02-28: qty 50

## 2015-02-28 MED ORDER — ONDANSETRON HCL 4 MG/2ML IJ SOLN: 4.0000 mg | Freq: Four times a day (QID) | INTRAMUSCULAR | Status: DC | PRN

## 2015-02-28 NOTE — Progress Notes (Signed)
CRITICAL VALUE ALERT  Critical value received:  Potassium 7.6  Date of notification:  02/28/15  Time of notification:  4656  Critical value read back:Yes.    Nurse who received alert:  A. Ouida Sills, RN  MD notified:  Dr. [name] at 1248, 02/28/15  Told by MD to contact pt and instruct him to report to the ED ASAP.  Called only available phone number listed in pt's chart and left message to contact the clinic ASAP regarding lab results.

## 2015-02-28 NOTE — Patient Instructions (Signed)
[place] at [facility] Discharge Instructions  RECOMMENDATIONS MADE BY THE CONSULTANT AND ANY TEST RESULTS WILL BE SENT TO YOUR REFERRING PHYSICIAN.  You were given B12 today.    Thank you for choosing [place] at [facility] to provide your oncology and hematology care.  To afford each patient quality time with our provider, please arrive at least 15 minutes before your scheduled appointment time.    You need to re-schedule your appointment should you arrive 10 or more minutes late.  We strive to give you quality time with our providers, and arriving late affects you and other patients whose appointments are after yours.  Also, if you no show three or more times for appointments you may be dismissed from the clinic at the providers discretion.     Again, thank you for choosing [facility].  Our hope is that these requests will decrease the amount of time that you wait before being seen by our physicians.       _____________________________________________________________  Should you have questions after your visit to [facility], please contact our office at [phone] between the hours of 8:30 a.m. and 4:30 p.m.  Voicemails left after 4:30 p.m. will not be returned until the following business day.  For prescription refill requests, have your pharmacy contact our office.

## 2015-02-28 NOTE — ED Notes (Signed)
Pt sent here by PCP for elevated potassium levels. Pt states potassium was 7.4.

## 2015-02-28 NOTE — Progress Notes (Unsigned)
Randall Reyes presents today for injection per MD orders. B12 1024mcg administered IM in right Upper Arm. Administration without incident. Patient tolerated well.

## 2015-02-28 NOTE — Progress Notes (Signed)
Labs drawn

## 2015-02-28 NOTE — H&P (Signed)
[facility] History and Physical  Randall Reyes IYM:[id number] DOB: 1936/05/05 DOA: 02/28/2015  Referring physician: ER PCP: [name], MD   Chief Complaint: Hyperkalemia  HPI: Randall Reyes is a 79 y.o. male  This is a 79 year old man who had his blood work checked today and was found to have hyperkalemia. He has been feeling somewhat weaker the last couple of days. He is diabetic. He has had venous admissions with hyperkalemia and all medications relating to renal function have been discontinued. He denies any palpitations. There is no chest pain or dyspnea. He has a history of CLL. And diabetes. He is now being admitted for further management.   Review of Systems:  Apart from symptoms above, all systems are negative.  Past Medical History  Diagnosis Date  . Diabetes mellitus   . Leukemia ([place]) 6.22.2012    PER PATIENT  . Hypertension   . Ulcer   . Anemia   . CLL (chronic lymphocytic leukemia) ([place]) 01/24/2011  . Melanoma in situ ([place]) 01/24/2011  . DM (diabetes mellitus) ([place]) 01/24/2011  . Mild obesity 01/24/2011  . Eczema 01/24/2011  . Port catheter in place 05/02/2012  . Shingles   . History of pneumonia 04/2014  . B12 deficiency 12/07/2014   Past Surgical History  Procedure Laterality Date  . Hernia repair  2001  . Bleeding ulcer    . Portacath placement  2008  . Cataract extraction w/phaco  05/05/2012    Procedure: CATARACT EXTRACTION PHACO AND INTRAOCULAR LENS PLACEMENT (IOC);  Surgeon: Tonny Branch, MD;  Location: [facility];  Service: Ophthalmology;  Laterality: Right;  CDE:13.25  . Cataract extraction w/phaco  05/15/2012    Procedure: CATARACT EXTRACTION PHACO AND INTRAOCULAR LENS PLACEMENT (IOC);  Surgeon: Tonny Branch, MD;  Location: [facility];  Service: Ophthalmology;  Laterality: Left;  CDE:  12.32   Social History:  reports that he has quit smoking. He quit smokeless tobacco use about 47 years ago. He reports that he does not drink alcohol or use  illicit drugs.  Allergies  Allergen Reactions  . Aspirin Other (See Comments)    Bleeding ulcers.   . Ibuprofen Other (See Comments)    Bleeding ulcers.    Family History  Problem Relation Age of Onset  . Diabetes Father     Prior to Admission medications   Medication Sig Start Date End Date Taking? Authorizing Provider  acetaminophen (TYLENOL) 500 MG tablet Take 500 mg by mouth at bedtime.   Yes Historical Provider, MD  fluocinonide cream (LIDEX) 0.05 % APPLY TO AFFECTED AREA UP TO TWICE A DAY AS NEEDED NOT TO FACE, GROIN, UNDER ARMS 11/25/14  Yes Historical Provider, MD  lansoprazole (PREVACID) 30 MG capsule Take 30 mg by mouth daily. 08/30/14  Yes Historical Provider, MD  rosuvastatin (CRESTOR) 20 MG tablet Take 20 mg by mouth daily.    Yes Historical Provider, MD  sitaGLIPtin (JANUVIA) 100 MG tablet Take 100 mg by mouth daily.   Yes Historical Provider, MD  ranitidine (ZANTAC) 150 MG tablet Take 1 tablet (150 mg total) by mouth 2 (two) times daily. Patient not taking: Reported on 02/28/2015 02/14/15   Marjean Donna, MD   Physical Exam: Filed Vitals:   02/28/15 1353 02/28/15 1500 02/28/15 1600 02/28/15 1630  BP: 126/60 119/54 96/60 95/61   Pulse: 44 42 39 40  Temp: 97.5 F (36.4 C)     TempSrc: Oral     Resp: 14 15 16 16   Height: 5\' 2"  (1.575 m)  Weight: 58.968 kg (130 lb)     SpO2: 100% 99% 100% 99%    Wt Readings from Last 3 Encounters:  02/28/15 58.968 kg (130 lb)  02/11/15 56.246 kg (124 lb)  02/02/15 55.747 kg (122 lb 14.4 oz)    General:  Appears somewhat clinically dehydrated. Otherwise he is alert and orientated. Eyes: PERRL, normal lids, irises & conjunctiva ENT: grossly normal hearing, lips & tongue Neck: no LAD, masses or thyromegaly Cardiovascular: RRR, no m/r/g. No LE edema. Telemetry: SR, no arrhythmias  Respiratory: CTA bilaterally, no w/r/r. Normal respiratory effort. Abdomen: soft, ntnd Skin: no rash or induration seen on limited  exam Musculoskeletal: grossly normal tone BUE/BLE Psychiatric: grossly normal mood and affect, speech fluent and appropriate Neurologic: grossly non-focal.          Labs on Admission:  Basic Metabolic Panel:  Recent Labs Lab 02/28/15 1201 02/28/15 1501  NA 132*  --   K 7.6* 6.9*  CL 106  --   CO2 24  --   GLUCOSE 113*  --   BUN 38*  --   CREATININE 2.64*  --   CALCIUM 8.8*  --    Liver Function Tests:  Recent Labs Lab 02/28/15 1201  AST 20  ALT 13*  ALKPHOS 77  BILITOT 0.3  PROT 8.6*  ALBUMIN 3.6   No results for input(s): LIPASE, AMYLASE in the last 168 hours. No results for input(s): AMMONIA in the last 168 hours. CBC:  Recent Labs Lab 02/28/15 1201  WBC 15.5*  NEUTROABS 4.0  HGB 11.8*  HCT 36.4*  MCV 102.8*  PLT 188   Cardiac Enzymes: No results for input(s): CKTOTAL, CKMB, CKMBINDEX, TROPONINI in the last 168 hours.  BNP (last 3 results) No results for input(s): BNP in the last 8760 hours.  ProBNP (last 3 results) No results for input(s): PROBNP in the last 8760 hours.  CBG: No results for input(s): GLUCAP in the last 168 hours.  Radiological Exams on Admission: No results found.  EKG: Independently reviewed. Appears to be junctional bradycardia. However the monitor seems to be better with a ventricular rate of 70 in sinus rhythm. An ECG will be repeated.  Assessment/Plan   1. Hyperkalemia. He has been given intravenous insulin, Kayexalate and bicarbonate. Monitor renal function and acid levels closely. IV normal saline. 2. Acute kidney injury. His BUN and creatinine have risen. He will be given IV fluids. 3. CLL. Appears to be stable. 4. Diabetes. Continue home medications and sliding scale of insulin.  He'll be admitted to the stepdown unit for close monitoring. Further recommendations will depend on patient's hospital progress.   Code Status: Full code.  DVT Prophylaxis: Lovenox.  Family Communication: I discussed the plan with  the patient at the bedside.  Disposition Plan: Home when medically stable.   Time spent: 60 minutes.  Doree Albee [facility] Pager [phone].

## 2015-02-28 NOTE — Progress Notes (Unsigned)
Report potassium level 7.6 to [facility]. He advised patient to go to emergency room. Instruct patient and his wife, they agreed and went to ER.

## 2015-02-28 NOTE — ED Notes (Signed)
CRITICAL VALUE ALERT  Critical value received:  potassium  Date of notification:  02/28/15  Time of notification: 1607  Critical value read back: yes  Nurse who received alert: j. Jerl Santos  MD notified (1st page):  zammit  Time of first page:  1608  MD notified (2nd page):  Time of second page:  Responding MD:  zammit  Time MD responded:  [phone]

## 2015-02-28 NOTE — ED Provider Notes (Signed)
CSN: [id number]     Arrival date & time 02/28/15  1342 History   First MD Initiated Contact with Patient 02/28/15 1421     Chief Complaint  Patient presents with  . Abnormal Lab     (Consider location/radiation/quality/duration/timing/severity/associated sxs/prior Treatment) Patient is a 79 y.o. male presenting with weakness. The history is provided by the patient (Patient was seen over at the cancer center today. Had his potassium checked it was elevated. He was sent to the ER).  Weakness This is a new problem. The current episode started yesterday. The problem occurs rarely. The problem has been resolved. Pertinent negatives include no chest pain, no abdominal pain and no headaches. Nothing aggravates the symptoms. Nothing relieves the symptoms.    Past Medical History  Diagnosis Date  . Diabetes mellitus   . Leukemia ([place]) 6.22.2012    PER PATIENT  . Hypertension   . Ulcer   . Anemia   . CLL (chronic lymphocytic leukemia) ([place]) 01/24/2011  . Melanoma in situ ([place]) 01/24/2011  . DM (diabetes mellitus) ([place]) 01/24/2011  . Mild obesity 01/24/2011  . Eczema 01/24/2011  . Port catheter in place 05/02/2012  . Shingles   . History of pneumonia 04/2014  . B12 deficiency 12/07/2014   Past Surgical History  Procedure Laterality Date  . Hernia repair  2001  . Bleeding ulcer    . Portacath placement  2008  . Cataract extraction w/phaco  05/05/2012    Procedure: CATARACT EXTRACTION PHACO AND INTRAOCULAR LENS PLACEMENT (IOC);  Surgeon: Tonny Branch, MD;  Location: [facility];  Service: Ophthalmology;  Laterality: Right;  CDE:13.25  . Cataract extraction w/phaco  05/15/2012    Procedure: CATARACT EXTRACTION PHACO AND INTRAOCULAR LENS PLACEMENT (IOC);  Surgeon: Tonny Branch, MD;  Location: [facility];  Service: Ophthalmology;  Laterality: Left;  CDE:  12.32   Family History  Problem Relation Age of Onset  . Diabetes Father    Social History  Substance Use Topics  . Smoking status: Former Research scientist (life sciences)  .  Smokeless tobacco: Former Systems developer    Quit date: 04/25/1967  . Alcohol Use: No    Review of Systems  Constitutional: Negative for appetite change and fatigue.  HENT: Negative for congestion, ear discharge and sinus pressure.   Eyes: Negative for discharge.  Respiratory: Negative for cough.   Cardiovascular: Negative for chest pain.  Gastrointestinal: Negative for abdominal pain and diarrhea.  Genitourinary: Negative for frequency and hematuria.  Musculoskeletal: Negative for back pain.  Skin: Negative for rash.  Neurological: Positive for weakness. Negative for seizures and headaches.  Psychiatric/Behavioral: Negative for hallucinations.      Allergies  Aspirin and Ibuprofen  Home Medications   Prior to Admission medications   Medication Sig Start Date End Date Taking? Authorizing Provider  acetaminophen (TYLENOL) 500 MG tablet Take 500 mg by mouth at bedtime.   Yes Historical Provider, MD  fluocinonide cream (LIDEX) 0.05 % APPLY TO AFFECTED AREA UP TO TWICE A DAY AS NEEDED NOT TO FACE, GROIN, UNDER ARMS 11/25/14  Yes Historical Provider, MD  lansoprazole (PREVACID) 30 MG capsule Take 30 mg by mouth daily. 08/30/14  Yes Historical Provider, MD  rosuvastatin (CRESTOR) 20 MG tablet Take 20 mg by mouth daily.    Yes Historical Provider, MD  sitaGLIPtin (JANUVIA) 100 MG tablet Take 100 mg by mouth daily.   Yes Historical Provider, MD  ranitidine (ZANTAC) 150 MG tablet Take 1 tablet (150 mg total) by mouth 2 (two) times daily. Patient not taking: Reported  on 02/28/2015 02/14/15   Angus McInnis, MD   BP 95/61 mmHg  Pulse 40  Temp(Src) 97.5 F (36.4 C) (Oral)  Resp 16  Ht 5\' 2"  (1.575 m)  Wt 130 lb (58.968 kg)  BMI 23.77 kg/m2  SpO2 99% Physical Exam  Constitutional: He is oriented to person, place, and time. He appears well-developed.  HENT:  Head: Normocephalic.  Eyes: Conjunctivae and EOM are normal. No scleral icterus.  Neck: Neck supple. No thyromegaly present.   Cardiovascular: Regular rhythm.  Bradycardia present.  Exam reveals no gallop and no friction rub.   No murmur heard. Pulmonary/Chest: No stridor. He has no wheezes. He has no rales. He exhibits no tenderness.  Abdominal: He exhibits no distension. There is no tenderness. There is no rebound.  Musculoskeletal: Normal range of motion. He exhibits no edema.  Lymphadenopathy:    He has no cervical adenopathy.  Neurological: He is oriented to person, place, and time. He exhibits normal muscle tone. Coordination normal.  Skin: No rash noted. No erythema.  Psychiatric: He has a normal mood and affect. His behavior is normal.    ED Course  Procedures (including critical care time) Labs Review Labs Reviewed  POTASSIUM - Abnormal; Notable for the following:    Potassium 6.9 (*)    All other components within normal limits   .edecr CRITICAL CARE Performed by: Keison Glendinning L Total critical care time: 40 Critical care time was exclusive of separately billable procedures and treating other patients. Critical care was necessary to treat or prevent imminent or life-threatening deterioration. Critical care was time spent personally by me on the following activities: development of treatment plan with patient and/or surrogate as well as nursing, discussions with consultants, evaluation of patient's response to treatment, examination of patient, obtaining history from patient or surrogate, ordering and performing treatments and interventions, ordering and review of laboratory studies, ordering and review of radiographic studies, pulse oximetry and re-evaluation of patient's condition.  Imaging Review No results found. I have personally reviewed and evaluated these images and lab results as part of my medical decision-making.   EKG Interpretation   Date/Time:  Monday February 28 2015 14:28:14 EDT Ventricular Rate:  43 PR Interval:    QRS Duration: 124 QT Interval:  456 QTC Calculation: 386 R  Axis:   -63 Text Interpretation:  Junctional rhythm Right bundle branch block  Confirmed by Aubrina Nieman  MD, Rickayla Wieland ([id number]) on 02/28/2015 4:59:49 PM      MDM   Final diagnoses:  Hyperkalemia     Hyperkalemia patient will be admitted to ICU. Will be given Kayexalate calcium bicarbonate and glucose and insulin   Milton Ferguson, MD 02/28/15 1700

## 2015-03-01 LAB — COMPREHENSIVE METABOLIC PANEL
ALBUMIN: 3 g/dL — AB (ref 3.5–5.0)
ALT: 14 U/L — ABNORMAL LOW (ref 17–63)
AST: 21 U/L (ref 15–41)
Alkaline Phosphatase: 65 U/L (ref 38–126)
Anion gap: 5 (ref 5–15)
BUN: 30 mg/dL — AB (ref 6–20)
CHLORIDE: 114 mmol/L — AB (ref 101–111)
CO2: 22 mmol/L (ref 22–32)
Calcium: 8.2 mg/dL — ABNORMAL LOW (ref 8.9–10.3)
Creatinine, Ser: 1.98 mg/dL — ABNORMAL HIGH (ref 0.61–1.24)
GFR calc Af Amer: 36 mL/min — ABNORMAL LOW (ref >60)
GFR calc non Af Amer: 31 mL/min — ABNORMAL LOW (ref >60)
GLUCOSE: 91 mg/dL (ref 65–99)
POTASSIUM: 5.5 mmol/L — AB (ref 3.5–5.1)
SODIUM: 141 mmol/L (ref 135–145)
TOTAL PROTEIN: 6.9 g/dL (ref 6.5–8.1)
Total Bilirubin: 0.3 mg/dL (ref 0.3–1.2)

## 2015-03-01 LAB — GLUCOSE, CAPILLARY
GLUCOSE-CAPILLARY: 106 mg/dL — AB (ref 65–99)
GLUCOSE-CAPILLARY: 95 mg/dL (ref 65–99)
Glucose-Capillary: 81 mg/dL (ref 65–99)
Glucose-Capillary: 91 mg/dL (ref 65–99)

## 2015-03-01 LAB — CBC
HEMATOCRIT: 32.4 % — AB (ref 39.0–52.0)
HEMOGLOBIN: 10.1 g/dL — AB (ref 13.0–17.0)
MCH: 32.5 pg (ref 26.0–34.0)
MCHC: 31.2 g/dL (ref 30.0–36.0)
MCV: 104.2 fL — AB (ref 78.0–100.0)
Platelets: 160 10*3/uL (ref 150–400)
RBC: 3.11 MIL/uL — ABNORMAL LOW (ref 4.22–5.81)
RDW: 15.5 % (ref 11.5–15.5)
WBC: 8.1 10*3/uL (ref 4.0–10.5)

## 2015-03-01 LAB — MRSA PCR SCREENING: MRSA by PCR: NEGATIVE

## 2015-03-01 MED ORDER — PANTOPRAZOLE SODIUM 40 MG PO TBEC
40.0000 mg | DELAYED_RELEASE_TABLET | Freq: Every day | ORAL | Status: DC
  Administered 2015-03-01 – 2015-03-02 (×2): 40 mg via ORAL
  Filled 2015-03-01 (×2): qty 1

## 2015-03-01 NOTE — Care Management Note (Signed)
Case Management Note  Patient Details  Name: Randall Reyes MRN: [id number] Date of Birth: 1936-01-07   Expected Discharge Date:    03/01/2015              Expected Discharge Plan:  Home/Self Care  In-House Referral:  NA  Discharge planning Services  CM Consult  Post Acute Care Choice:  NA Choice offered to:  NA  DME Arranged:    DME Agency:     HH Arranged:    [place] Agency:     Status of Service:  Completed, signed off  Medicare Important Message Given:    Date Medicare IM Given:    Medicare IM give by:    Date Additional Medicare IM Given:    Additional Medicare Important Message give by:     If discussed at [place] of Stay Meetings, dates discussed:    Additional Comments: Admitted for hyperkalemia. Pt is from home, lives with his wife and is ind at baseline with ADL's. Pt has a cane for PRN use. Pt has no HH services, DME or med needs prior to admission. Pt planning to return home with self care at DC. No CM needs anticipated.  Sherald Barge, RN 03/01/2015, 2:52 PM

## 2015-03-01 NOTE — Consult Note (Signed)
Reason for Consult: Hyperkalemia and acute kidney injury Referring Physician: Dr. Corliss Blacker is an 79 y.o. male.  HPI: He is a patient who has history of diabetes, hypertension, chronic lymphocytic leukemia, chronic renal failure stage III presently was admitted because of hyperkalemia and acute kidney injury. Patient was admitted previously for similar issues. Presently patient denies any nausea or vomiting. He denies also any difficulty in breathing.  Past Medical History  Diagnosis Date  . Diabetes mellitus   . Leukemia ([place]) 6.22.2012    PER PATIENT  . Hypertension   . Ulcer   . Anemia   . CLL (chronic lymphocytic leukemia) ([place]) 01/24/2011  . Melanoma in situ ([place]) 01/24/2011  . DM (diabetes mellitus) ([place]) 01/24/2011  . Mild obesity 01/24/2011  . Eczema 01/24/2011  . Port catheter in place 05/02/2012  . Shingles   . History of pneumonia 04/2014  . B12 deficiency 12/07/2014    Past Surgical History  Procedure Laterality Date  . Hernia repair  2001  . Bleeding ulcer    . Portacath placement  2008  . Cataract extraction w/phaco  05/05/2012    Procedure: CATARACT EXTRACTION PHACO AND INTRAOCULAR LENS PLACEMENT (IOC);  Surgeon: Tonny Branch, MD;  Location: [facility];  Service: Ophthalmology;  Laterality: Right;  CDE:13.25  . Cataract extraction w/phaco  05/15/2012    Procedure: CATARACT EXTRACTION PHACO AND INTRAOCULAR LENS PLACEMENT (IOC);  Surgeon: Tonny Branch, MD;  Location: [facility];  Service: Ophthalmology;  Laterality: Left;  CDE:  12.32    Family History  Problem Relation Age of Onset  . Diabetes Father     Social History:  reports that he has quit smoking. He quit smokeless tobacco use about 47 years ago. He reports that he does not drink alcohol or use illicit drugs.  Allergies:  Allergies  Allergen Reactions  . Aspirin Other (See Comments)    Bleeding ulcers.   . Ibuprofen Other (See Comments)    Bleeding ulcers.    Medications: I have reviewed the  patient's current medications.  Results for orders placed or performed during the hospital encounter of 02/28/15 (from the past 48 hour(s))  Potassium     Status: Abnormal   Collection Time: 02/28/15  3:01 PM  Result Value Ref Range   Potassium 6.9 (HH) 3.5 - 5.1 mmol/L    Comment: CRITICAL RESULT CALLED TO, READ BACK BY AND VERIFIED WITH: YOUNG J. AT 1607 ON [id number] BY THOMPSON S.   MRSA PCR Screening     Status: None   Collection Time: 02/28/15  6:00 PM  Result Value Ref Range   MRSA by PCR NEGATIVE NEGATIVE    Comment:        The GeneXpert MRSA Assay (FDA approved for NASAL specimens only), is one component of a comprehensive MRSA colonization surveillance program. It is not intended to diagnose MRSA infection nor to guide or monitor treatment for MRSA infections.   Glucose, capillary     Status: Abnormal   Collection Time: 02/28/15  6:43 PM  Result Value Ref Range   Glucose-Capillary 45 (L) 65 - 99 mg/dL   Comment 1 Document in Chart    Comment 2 Repeat Test   Glucose, capillary     Status: None   Collection Time: 02/28/15  6:59 PM  Result Value Ref Range   Glucose-Capillary 75 65 - 99 mg/dL   Comment 1 Notify RN    Comment 2 Document in Chart   Glucose, capillary  Status: Abnormal   Collection Time: 02/28/15  9:13 PM  Result Value Ref Range   Glucose-Capillary 100 (H) 65 - 99 mg/dL  Comprehensive metabolic panel     Status: Abnormal   Collection Time: 03/01/15  4:15 AM  Result Value Ref Range   Sodium 141 135 - 145 mmol/L    Comment: DELTA CHECK NOTED   Potassium 5.5 (H) 3.5 - 5.1 mmol/L   Chloride 114 (H) 101 - 111 mmol/L   CO2 22 22 - 32 mmol/L   Glucose, Bld 91 65 - 99 mg/dL   BUN 30 (H) 6 - 20 mg/dL   Creatinine, Ser 1.98 (H) 0.61 - 1.24 mg/dL   Calcium 8.2 (L) 8.9 - 10.3 mg/dL   Total Protein 6.9 6.5 - 8.1 g/dL   Albumin 3.0 (L) 3.5 - 5.0 g/dL   AST 21 15 - 41 U/L   ALT 14 (L) 17 - 63 U/L   Alkaline Phosphatase 65 38 - 126 U/L   Total Bilirubin  0.3 0.3 - 1.2 mg/dL   GFR calc non Af Amer 31 (L) >60 mL/min   GFR calc Af Amer 36 (L) >60 mL/min    Comment: (NOTE) The eGFR has been calculated using the CKD EPI equation. This calculation has not been validated in all clinical situations. eGFR's persistently <60 mL/min signify possible Chronic Kidney Disease.    Anion gap 5 5 - 15  CBC     Status: Abnormal   Collection Time: 03/01/15  4:15 AM  Result Value Ref Range   WBC 8.1 4.0 - 10.5 K/uL   RBC 3.11 (L) 4.22 - 5.81 MIL/uL   Hemoglobin 10.1 (L) 13.0 - 17.0 g/dL   HCT 32.4 (L) 39.0 - 52.0 %   MCV 104.2 (H) 78.0 - 100.0 fL   MCH 32.5 26.0 - 34.0 pg   MCHC 31.2 30.0 - 36.0 g/dL   RDW 15.5 11.5 - 15.5 %   Platelets 160 150 - 400 K/uL  Glucose, capillary     Status: None   Collection Time: 03/01/15  7:33 AM  Result Value Ref Range   Glucose-Capillary 81 65 - 99 mg/dL   Comment 1 Notify RN    Comment 2 Document in Chart     No results found.  Review of Systems  Constitutional: Negative for fever and chills.  Respiratory: Negative for shortness of breath.   Cardiovascular: Negative for leg swelling.  Gastrointestinal: Negative for nausea, vomiting and abdominal pain.  Neurological: Positive for weakness.   Blood pressure 124/61, pulse 59, temperature 97.4 F (36.3 C), temperature source Oral, resp. rate 12, height 5' 2"  (1.575 m), weight 127 lb 6.8 oz (57.8 kg), SpO2 96 %. Physical Exam  Constitutional: He is oriented to person, place, and time. No distress.  Eyes: No scleral icterus.  Neck: No JVD present.  Cardiovascular: Normal rate and regular rhythm.   Respiratory: He has no wheezes. He has no rales.  GI: He exhibits no distension. There is no tenderness. There is no rebound.  Musculoskeletal: He exhibits no edema.  Neurological: He is alert and oriented to person, place, and time.    Assessment/Plan: Problem #1 hyperkalemia: Most likely from his diet. Patient told me he is not taking any medication except  medication for his diabetes. He denies taking trimatene hydrochlorothiazide and also lisinopril. Patient also denies eating anything which contains high potassium. Patient has a list of foods which he needed to avoid which was given to him when he came  to the office. Presently his potassium is much better. Problem #2 acute kidney injury possibly prerenal. Presently his renal function is also improving. Problem #3 hypertension: His blood pressure is reasonably controlled Problem #4 diabetes: His blood sugar is well controlled Problem #5 history of chronic renal failure: Stage III. Thought to be secondary to diabetes/hypertension/age-related loss/recurrent acute kidney injury. Problem #6 anemia: His hemoglobin and hematocrit is within our target goal Problem #7 chronic lymphocytic leukemia Plan: Agree with hydration We'll check his basic metabolic panel in the morning I have reinforced that he need to be on low potassium diet and also to stop taking trimethoprim hydrochlorothiazide and ACE inhibitor.    Talana Slatten S 03/01/2015, 8:43 AM

## 2015-03-01 NOTE — Progress Notes (Signed)
Inpatient Diabetes Program Recommendations  AACE/ADA: New Consensus Statement on Inpatient Glycemic Control (2015)  Target Ranges:  Prepandial:   less than 140 mg/dL      Peak postprandial:   less than 180 mg/dL (1-2 hours)      Critically ill patients:  140 - 180 mg/dL  Results for ALFREDO, SPONG (MRN [id number]) as of 03/01/2015 08:20  Ref. Range 02/03/2015 16:26 02/28/2015 18:43 02/28/2015 18:59 02/28/2015 21:13 03/01/2015 07:33  Glucose-Capillary Latest Ref Range: 65-99 mg/dL 113 (H) 45 (L) 75 100 (H) 81   Review of Glycemic Control  Diabetes history: DM2 Outpatient Diabetes medications: Januvia 100 mg daily Current orders for Inpatient glycemic control: Tradjenta 5 mg daily, Novolog 0-9 units TID with meals, Novolog 0-5 units HS  NOTE: Patient was admitted with hyperkalemia and was given Novolog 10 units at 17:10 for hyperkalemia treatment. Noted glucose dropped to 45 mg/dl at 18:43 on 02/28/15 as a result of Novolog insulin being given at 17:10.  Do not recommend any changes with current inpatient glycemic orders at this time. Will continue to follow.  Thanks, Barnie Alderman, RN, MSN, CCRN, CDE Diabetes Coordinator Inpatient Diabetes Program [phone] (Team Pager from [place] to [place]) [phone] ([facility] office) [phone] [facility] office) [phone] [facility] office)

## 2015-03-01 NOTE — Progress Notes (Signed)
Subjective: The patient is asymptomatic. He was admitted with hyperkalemia and treated for this. He had chemistries checked again today his potassium was 5.5  Objective: Vital signs in last 24 hours: Temp:  [97.4 F (36.3 C)-98.3 F (36.8 C)] 97.4 F (36.3 C) (10/18 0749) Pulse Rate:  [37-69] 53 (10/18 0900) Resp:  [11-20] 13 (10/18 0900) BP: (85-131)/(45-69) 116/56 mmHg (10/18 0900) SpO2:  [94 %-100 %] 99 % (10/18 0900) Weight:  [57.8 kg (127 lb 6.8 oz)-58.3 kg (128 lb 8.5 oz)] 57.8 kg (127 lb 6.8 oz) (10/18 0500) Weight change:  Last BM Date: 03/01/15  Intake/Output from previous day: 10/17 0701 - 10/18 0700 In: 1551.3 [P.O.:120; I.V.:1431.3] Out: 1052 [Urine:1050; Stool:2] Intake/Output this shift: Total I/O In: 240 [P.O.:240] Out: 100 [Urine:100]  Physical Exam: General appearance patient is alert and oriented  HEENT negative  Neck supple no JVD or thyroid abnormalities  Heart regular rhythm no murmurs  Lungs clear to P&A  Abdomen no palpable organs or masses   Recent Labs  02/28/15 1201 03/01/15 0415  WBC 15.5* 8.1  HGB 11.8* 10.1*  HCT 36.4* 32.4*  PLT 188 160   BMET  Recent Labs  02/28/15 1201 02/28/15 1501 03/01/15 0415  NA 132*  --  141  K 7.6* 6.9* 5.5*  CL 106  --  114*  CO2 24  --  22  GLUCOSE 113*  --  91  BUN 38*  --  30*  CREATININE 2.64*  --  1.98*  CALCIUM 8.8*  --  8.2*    Studies/Results: No results found.  Medications:  . acetaminophen  500 mg Oral QHS  . enoxaparin (LOVENOX) injection  30 mg Subcutaneous Q24H  . fluocinonide cream   Topical BID  . insulin aspart  0-5 Units Subcutaneous QHS  . insulin aspart  0-9 Units Subcutaneous TID WC  . linagliptin  5 mg Oral Daily  . pantoprazole  40 mg Oral Daily  . sodium chloride  3 mL Intravenous Q12H    . sodium chloride 125 mL/hr at 03/01/15 1146     Assessment/Plan: CK D stage III with hyperkalemia  Essential hypertension  Non-insulin-dependent diabetes  Chronic  lymphocytic leukemia  Plan to repeat be met in a.m. it was recommended by nephrology that we stop triamterene and ACE inhibitor continue to monitor chemistries   LOS: 1 day   Stefan Markarian G 03/01/2015, 4:59 PM

## 2015-03-02 LAB — GLUCOSE, CAPILLARY
GLUCOSE-CAPILLARY: 73 mg/dL (ref 65–99)
GLUCOSE-CAPILLARY: 81 mg/dL (ref 65–99)
Glucose-Capillary: 74 mg/dL (ref 65–99)
Glucose-Capillary: 85 mg/dL (ref 65–99)

## 2015-03-02 LAB — BASIC METABOLIC PANEL
Anion gap: 1 — ABNORMAL LOW (ref 5–15)
BUN: 19 mg/dL (ref 6–20)
CHLORIDE: 116 mmol/L — AB (ref 101–111)
CO2: 22 mmol/L (ref 22–32)
CREATININE: 1.6 mg/dL — AB (ref 0.61–1.24)
Calcium: 8 mg/dL — ABNORMAL LOW (ref 8.9–10.3)
GFR calc non Af Amer: 40 mL/min — ABNORMAL LOW (ref >60)
GFR, EST AFRICAN AMERICAN: 46 mL/min — AB (ref >60)
Glucose, Bld: 94 mg/dL (ref 65–99)
Potassium: 5.2 mmol/L — ABNORMAL HIGH (ref 3.5–5.1)
SODIUM: 139 mmol/L (ref 135–145)

## 2015-03-02 NOTE — Progress Notes (Signed)
Subjective: The patient is asymptomatic with the exception of the fact that he has been his right arm which is infiltrated. He was admitted with hyperkalemia. He is receiving IV fluids his potassium now is 5.2, creatinine 1.62 which is trending downward  Objective: Vital signs in last 24 hours: Temp:  [97.8 F (36.6 C)-97.9 F (36.6 C)] 97.9 F (36.6 C) (10/19 1400) Pulse Rate:  [54-56] 56 (10/19 1400) Resp:  [16-20] 18 (10/19 1400) BP: (118-143)/(57-91) 128/57 mmHg (10/19 1400) SpO2:  [98 %-100 %] 99 % (10/19 1400) Weight change:  Last BM Date: 03/01/15  Intake/Output from previous day: 10/18 0701 - 10/19 0700 In: 3563.8 [P.O.:720; I.V.:2843.8] Out: 100 [Urine:100] Intake/Output this shift: Total I/O In: 240 [P.O.:240] Out: -   Physical Exam: Gen. parents-the patient is alert and oriented  HEENT negative  Neck supple no JVD or thyroid abnormalities  Heart regular rhythm no murmurs  Lungs clear to P&A  Abdomen no palpable organs or masses   Recent Labs  02/28/15 1201 03/01/15 0415  WBC 15.5* 8.1  HGB 11.8* 10.1*  HCT 36.4* 32.4*  PLT 188 160   BMET  Recent Labs  03/01/15 0415 03/02/15 1401  NA 141 139  K 5.5* 5.2*  CL 114* 116*  CO2 22 22  GLUCOSE 91 94  BUN 30* 19  CREATININE 1.98* 1.60*  CALCIUM 8.2* 8.0*    Studies/Results: No results found.  Medications:  . acetaminophen  500 mg Oral QHS  . enoxaparin (LOVENOX) injection  30 mg Subcutaneous Q24H  . fluocinonide cream   Topical BID  . insulin aspart  0-5 Units Subcutaneous QHS  . insulin aspart  0-9 Units Subcutaneous TID WC  . linagliptin  5 mg Oral Daily  . pantoprazole  40 mg Oral Daily  . sodium chloride  3 mL Intravenous Q12H    . sodium chloride 125 mL/hr at 03/02/15 0545     Assessment/Plan: 1. CK D stage III with hyperkalemia-plan continue IV fluids repeat chemistries in a.m.  2. Essential hypertension  3. Non-insulin-dependent diabetes  4. Chronic lymphocytic  leukemia  Plan to continue IV fluids repeat be met in a.m. and possibly discharge patient   LOS: 2 days   Dalene Robards G 03/02/2015, 3:48 PM

## 2015-03-02 NOTE — Progress Notes (Signed)
Randall Reyes  MRN: [id number]  DOB/AGE: [birth year] 77 y.o.  Primary Care Physician:MCINNIS,ANGUS G, MD  Admit date: 02/28/2015  Chief Complaint:  Chief Complaint  Patient presents with  . Abnormal Lab    S-Pt presented on  02/28/2015 with  Chief Complaint  Patient presents with  . Abnormal Lab  .    Pt today feels better Meds  . acetaminophen  500 mg Oral QHS  . enoxaparin (LOVENOX) injection  30 mg Subcutaneous Q24H  . fluocinonide cream   Topical BID  . insulin aspart  0-5 Units Subcutaneous QHS  . insulin aspart  0-9 Units Subcutaneous TID WC  . linagliptin  5 mg Oral Daily  . pantoprazole  40 mg Oral Daily  . sodium chloride  3 mL Intravenous Q12H     Physical Exam: Vital signs in last 24 hours: Temp:  [97.8 F (36.6 C)-97.9 F (36.6 C)] 97.9 F (36.6 C) (10/19 1400) Pulse Rate:  [54-56] 56 (10/19 1400) Resp:  [16-20] 18 (10/19 1400) BP: (118-143)/(57-91) 128/57 mmHg (10/19 1400) SpO2:  [98 %-100 %] 99 % (10/19 1400) Weight change:  Last BM Date: 03/01/15  Intake/Output from previous day: 10/18 0701 - 10/19 0700 In: 3563.8 [P.O.:720; I.V.:2843.8] Out: 100 [Urine:100] Total I/O In: 240 [P.O.:240] Out: -    Physical Exam: General- pt is awake,alert, oriented to time place and person Resp- No acute REsp distress, CTA B/L NO Rhonchi CVS- S1S2 regular in rate and rhythm GIT- BS+, soft, NT, ND EXT- NO LE Edema, Cyanosis   Lab Results: CBC  Recent Labs  02/28/15 1201 03/01/15 0415  WBC 15.5* 8.1  HGB 11.8* 10.1*  HCT 36.4* 32.4*  PLT 188 160    BMET  Recent Labs  03/01/15 0415 03/02/15 1401  NA 141 139  K 5.5* 5.2*  CL 114* 116*  CO2 22 22  GLUCOSE 91 94  BUN 30* 19  CREATININE 1.98* 1.60*  CALCIUM 8.2* 8.0*   Potasium  2016 7.6=>5.2    MICRO Recent Results (from the past 240 hour(s))  MRSA PCR Screening     Status: None   Collection Time: 02/28/15  6:00 PM  Result Value Ref Range Status   MRSA by PCR NEGATIVE  NEGATIVE Final    Comment:        The GeneXpert MRSA Assay (FDA approved for NASAL specimens only), is one component of a comprehensive MRSA colonization surveillance program. It is not intended to diagnose MRSA infection nor to guide or monitor treatment for MRSA infections.       Lab Results  Component Value Date   PTH 24 02/03/2015   PTH Comment 02/03/2015   CALCIUM 8.0* 03/02/2015   PHOS 4.1 02/14/2015         Impression: 1)Renal AKI secondary to Pre renal/ATN  AKI on CKD  AKI now improving  CKD stage 3.  CKD since 2013  CKD secondary to HTN/age associated decline  Progression of CKD Multiple AKI  Proteinura -bence jones proteinuria  Hematuria none .  Nephrolithiasis Hx Absent   2)CVS- BP stable  Medication as outpt-pt was on  On RAAS blockers On potassium sparing Diuretics-  3)Anemia HGb stable  4)CKD Mineral-Bone Disorder PTH at goal Secondary Hyperparathyroidism absent Phosphorus At goal  5)Oncology- hx of CLL  M spike + in Urine-bence jones proteinuria  High Free kappa/lambda ratio Primary MD and Oncology following  6)Electrolytes  Hyperkalemic   AKI + CKD   Potassium trending down  NOrmonatremic   7)Acid  base Co2 just at goal    Plan:  Will continue current care Will suggest to start kayexalate  30mg  po twice  A week as outpt     BHUTANI,MANPREET S 03/02/2015, 3:38 PM

## 2015-03-03 LAB — COMPREHENSIVE METABOLIC PANEL
ALK PHOS: 62 U/L (ref 38–126)
ALT: 12 U/L — AB (ref 17–63)
AST: 15 U/L (ref 15–41)
Albumin: 2.9 g/dL — ABNORMAL LOW (ref 3.5–5.0)
Anion gap: 3 — ABNORMAL LOW (ref 5–15)
BILIRUBIN TOTAL: 0.5 mg/dL (ref 0.3–1.2)
BUN: 17 mg/dL (ref 6–20)
CALCIUM: 8.3 mg/dL — AB (ref 8.9–10.3)
CHLORIDE: 116 mmol/L — AB (ref 101–111)
CO2: 22 mmol/L (ref 22–32)
CREATININE: 1.56 mg/dL — AB (ref 0.61–1.24)
GFR calc Af Amer: 48 mL/min — ABNORMAL LOW (ref >60)
GFR, EST NON AFRICAN AMERICAN: 41 mL/min — AB (ref >60)
Glucose, Bld: 87 mg/dL (ref 65–99)
Potassium: 4.7 mmol/L (ref 3.5–5.1)
Sodium: 141 mmol/L (ref 135–145)
Total Protein: 6.8 g/dL (ref 6.5–8.1)

## 2015-03-03 LAB — GLUCOSE, CAPILLARY: GLUCOSE-CAPILLARY: 83 mg/dL (ref 65–99)

## 2015-03-03 NOTE — Care Management Note (Signed)
Case Management Note  Patient Details  Name: Randall Reyes MRN: [id number] Date of Birth: 10/17/1935  Subjective/Objective:                    Action/Plan:   Expected Discharge Date:                  Expected Discharge Plan:  Home/Self Care  In-House Referral:  NA  Discharge planning Services  CM Consult  Post Acute Care Choice:  NA Choice offered to:  NA  DME Arranged:    DME Agency:     HH Arranged:    [place] Agency:     Status of Service:  Completed, signed off  Medicare Important Message Given:    Date Medicare IM Given:    Medicare IM give by:    Date Additional Medicare IM Given:    Additional Medicare Important Message give by:     If discussed at [place] of Stay Meetings, dates discussed:    Additional Comments: Pt discharged home today. No CM needs noted. Christinia Gully [place], RN 03/03/2015, 7:48 AM

## 2015-03-03 NOTE — Care Management Important Message (Signed)
Important Message  Patient Details  Name: DERELL BRUUN MRN: [id number] Date of Birth: 04/21/1936   Medicare Important Message Given:  Yes-second notification given    Joylene Draft, RN 03/03/2015, 7:49 AM

## 2015-03-03 NOTE — Discharge Summary (Signed)
Physician Discharge Summary  MARKHAM DUMLAO QMV:[id number] DOB: 1935-08-26 DOA: 02/28/2015  PCP: [name], MD  Admit date: 02/28/2015 Discharge date: 03/03/2015     Discharge Diagnoses:  1. CK D stage III 2. Acute kidney injury 3. Hyperkalemia 4. Hypertension essential 5. Chronic lymphocytic leukemia 6. Non-insulin-dependent diabetes 7.  8.  9.   Discharge Condition: Stable Disposition: Home  Diet recommendation: 2000 Carrie ADA diet low potassium  Filed Weights   02/28/15 1353 02/28/15 1800 03/01/15 0500  Weight: 58.968 kg (130 lb) 58.3 kg (128 lb 8.5 oz) 57.8 kg (127 lb 6.8 oz)    History of present illness:  The patient has a history of CK D stage III, non-insulin-dependent diabetes, CLL, and was sent from cancer clinic to ED because of elevated serum potassium level and weakness he received Kayexalate glucose and calcium in ED and subsequently was admitted his potassium level was around [place] Hospital Course:  The patient remained relatively asymptomatic while in the hospital on intravenous fluids saline his potassium and chemistry were monitored and trended downward. His blood sugars remained relatively stable. He was seen by nephrology and it was felt that upon discharge that he could take Kayexalate twice a week 30 mg. It was also recommended that he stay off of drugs like triamterene and ace inhibitors. It was recommended that he follow-up with nephrology as outpatient as well as primary care physician and that he continue medications listed below he also should but shall follow with oncology   Discharge Instructions The patient is to continue medications listed below and it is recommended he take Kayexalate twice a week 30 mg    Medication List    TAKE these medications        acetaminophen 500 MG tablet  Commonly known as:  TYLENOL  Take 500 mg by mouth at bedtime.     fluocinonide cream 0.05 %  Commonly known as:  LIDEX  APPLY TO AFFECTED AREA UP  TO TWICE A DAY AS NEEDED NOT TO FACE, GROIN, UNDER ARMS     lansoprazole 30 MG capsule  Commonly known as:  PREVACID  Take 30 mg by mouth daily.     ranitidine 150 MG tablet  Commonly known as:  ZANTAC  Take 1 tablet (150 mg total) by mouth 2 (two) times daily.     rosuvastatin 20 MG tablet  Commonly known as:  CRESTOR  Take 20 mg by mouth daily.     sitaGLIPtin 100 MG tablet  Commonly known as:  JANUVIA  Take 100 mg by mouth daily.       Allergies  Allergen Reactions  . Aspirin Other (See Comments)    Bleeding ulcers.   . Ibuprofen Other (See Comments)    Bleeding ulcers.    The results of significant diagnostics from this hospitalization (including imaging, microbiology, ancillary and laboratory) are listed below for reference.    Significant Diagnostic Studies: US Renal  02/02/2015  CLINICAL DATA:  Acute tubular necrosis. EXAM: RENAL / URINARY TRACT ULTRASOUND COMPLETE COMPARISON:  None. FINDINGS: Right Kidney: Length: 8.4 cm. Echogenicity within normal limits. No mass or hydronephrosis visualized. Left Kidney: Length: 8.3 cm. Echogenicity within normal limits. No mass or hydronephrosis visualized. Bladder: Appears normal for degree of bladder distention. IMPRESSION: Bilateral renal atrophy is noted. No hydronephrosis or renal obstruction is noted. Electronically Signed   By: Marijo Conception, M.D.   On: 02/02/2015 15:33    Microbiology: Recent Results (from the past 240 hour(s))  MRSA  PCR Screening     Status: None   Collection Time: 02/28/15  6:00 PM  Result Value Ref Range Status   MRSA by PCR NEGATIVE NEGATIVE Final    Comment:        The GeneXpert MRSA Assay (FDA approved for NASAL specimens only), is one component of a comprehensive MRSA colonization surveillance program. It is not intended to diagnose MRSA infection nor to guide or monitor treatment for MRSA infections.      Labs: Basic Metabolic Panel:  Recent Labs Lab 02/28/15 1201 02/28/15 1501  03/01/15 0415 03/02/15 1401  NA 132*  --  141 139  K 7.6* 6.9* 5.5* 5.2*  CL 106  --  114* 116*  CO2 24  --  22 22  GLUCOSE 113*  --  91 94  BUN 38*  --  30* 19  CREATININE 2.64*  --  1.98* 1.60*  CALCIUM 8.8*  --  8.2* 8.0*   Liver Function Tests:  Recent Labs Lab 02/28/15 1201 03/01/15 0415  AST 20 21  ALT 13* 14*  ALKPHOS 77 65  BILITOT 0.3 0.3  PROT 8.6* 6.9  ALBUMIN 3.6 3.0*   No results for input(s): LIPASE, AMYLASE in the last 168 hours. No results for input(s): AMMONIA in the last 168 hours. CBC:  Recent Labs Lab 02/28/15 1201 03/01/15 0415  WBC 15.5* 8.1  NEUTROABS 4.0  --   HGB 11.8* 10.1*  HCT 36.4* 32.4*  MCV 102.8* 104.2*  PLT 188 160   Cardiac Enzymes: No results for input(s): CKTOTAL, CKMB, CKMBINDEX, TROPONINI in the last 168 hours. BNP: BNP (last 3 results) No results for input(s): BNP in the last 8760 hours.  ProBNP (last 3 results) No results for input(s): PROBNP in the last 8760 hours.  CBG:  Recent Labs Lab 03/01/15 2038 03/02/15 0740 03/02/15 1122 03/02/15 1640 03/02/15 2127  GLUCAP 91 74 73 81 85    Active Problems:   CLL (chronic lymphocytic leukemia) ([facility])   DM (diabetes mellitus) ([facility])   AKI (acute kidney injury) ([place])   Hyperkalemia   HTN (hypertension)   Time coordinating discharge: 45 minutes Signed:  Marjean Donna, MD 03/03/2015, 6:34 AM

## 2015-03-03 NOTE — Progress Notes (Signed)
Subjective: Patient offers no complaint. No nausea or vomiting. His appetite is good   Objective: Vital signs in last 24 hours: Temp:  [97.7 F (36.5 C)-97.9 F (36.6 C)] 97.7 F (36.5 C) (10/20 0407) Pulse Rate:  [56-69] 69 (10/20 0407) Resp:  [18-20] 20 (10/20 0407) BP: (108-128)/(57-85) 119/83 mmHg (10/20 0407) SpO2:  [96 %-100 %] 96 % (10/20 0407)  Intake/Output from previous day: 10/19 0701 - 10/20 0700 In: 1880 [P.O.:480; I.V.:1400] Out: -  Intake/Output this shift:     Recent Labs  02/28/15 1201 03/01/15 0415  HGB 11.8* 10.1*    Recent Labs  02/28/15 1201 03/01/15 0415  WBC 15.5* 8.1  RBC 3.54* 3.11*  HCT 36.4* 32.4*  PLT 188 160    Recent Labs  03/01/15 0415 03/02/15 1401  NA 141 139  K 5.5* 5.2*  CL 114* 116*  CO2 22 22  BUN 30* 19  CREATININE 1.98* 1.60*  GLUCOSE 91 94  CALCIUM 8.2* 8.0*   No results for input(s): LABPT, INR in the last 72 hours.  Generally patient is alert and in no apparent distress Chest is clear to auscultation Heart exam revealed regular rate and rhythm no murmur Extremities no edema  Assessment/Plan: Problem #1 acute kidney injury superimposed on chronic. His renal function continued to improve. Problem 2 hyperkalemia: His potassium has corrected. Problem #3 chronic renal failure: Stage III. Patient presently does not have any uremic signs and symptoms. Problem #4 hypertension: His blood pressure is reasonably controlled Problem #5 history of diabetes Problem #6 history of chronic lymphocytic ischemia Plan:1] I discussed with the patient and his wife about low potassium diet and to follow the  information which given to him during his office visit. 2] patient also advised to come to the office as scheduled previously.   Hayes Rehfeldt S 03/03/2015, 8:16 AM

## 2015-03-27 NOTE — Progress Notes (Signed)
[name], MD [place] [address]  CLL (chronic lymphocytic leukemia) [facility]) - Plan: heparin lock flush 100 unit/mL, sodium chloride 0.9 % injection 10 mL  B12 deficiency - Plan: SCHEDULING COMMUNICATION INJECTION, cyanocobalamin ((VITAMIN B-12)) injection 1,000 mcg  CURRENT THERAPY: Observation and monthly B12 injections since June 2016.  INTERVAL HISTORY: Randall Reyes 79 y.o. male returns for followup of CLL. AND B12 deficiency    CLL (chronic lymphocytic leukemia) ([place])   02/14/2005 Procedure Peripheral flow cytometry- CLL (without 11q or 17 p deletion) presenting with stage 0 disease   10/25/2008 Progression Increasing WBC and symptoms suggestive of progressive disease   11/09/2008 - 01/07/2009 Chemotherapy Rituxan, fludarabine, cytoxan x 3 cycles with the discontinuation of fludarabine and cytoxan due to severe pancytopenia requiring hospitalization.   01/10/2009 Adverse Reaction Severe pancytopenia and adverse side effects   02/01/2009 - 04/01/2009 Chemotherapy Rituxan every 21 days x 3 cycles   04/02/2009 Remission Stable disease.  Chemotherapy holiday started   06/22/2013 - 06/29/2013 Chemotherapy Imbruvica daily   06/29/2013 Adverse Reaction Large right posterior leg (popliteal) ecchymosis/violaceous rash.  Likely Imbruvica-induced   07/31/2013 - 10/12/2013 Chemotherapy Zydelig (idelalisib) 150 mg BID monotherapy   10/12/2013 Adverse Reaction Transaminitis, suspected to be secondary to [place].  Dose decreased.   10/12/2013 - 11/09/2013 Chemotherapy Zydelig (idelalisib) 150 mg daily.   11/09/2013 Adverse Reaction Worsening transaminitis.  Zydelig on HOLD   11/09/2013 Treatment Plan Change Hold Zydelig   12/28/2013 Adverse Reaction Herpes Zoster outbreak.  Partial response with Famvir x 1 week, switched to acyclovir.  Progression over 1-2 weeks with development of keratoconjunctivitis bilaterally.  VZ Immune Globulin 625 mg IM given on 01/13/14.   02/08/2014 Treatment Plan Change Ibrutinib restarted 140 mg daily for one week followed by 280 mg daily followed by 420 mg daily with prophylactic acyclovir.   02/08/2014 - 07/05/2014 Chemotherapy Ibrutinib 420 mg daily   109-25-2015 - 04/17/2014 Hospital Admission Right lower lobe pneumonia   07/05/2014 Adverse Reaction Fissuring of fingertips and sore, red feet with transaminitis   07/05/2014 Treatment Plan Change Hold Ibrutinib   07/22/2014 - 08/19/2014 Chemotherapy Ibrutinib restarted at 280 mg daily.   08/09/2014 - 08/12/2014 Hospital Admission Bilateral pneumonia   08/19/2014 Adverse Reaction Nausea with vomiting.  Escorted to ED.   08/19/2014 Treatment Plan Change Ibrutinib on hold- ED escort from clinic    10/18/2014 Miscellaneous B12 weekly x 4 then monthly.     I personally reviewed and went over laboratory results with the patient.  The results are noted within this dictation. His K+ is WNL.  Other labs are pending at this current time.  Clinically, he looks much better than when he was on active treatment.  He was last treated in April 2016.  He note received any CLL treatment since then.  Of late, he has been having significant issues with hyperkalemia requiring ED visits and hospitalizations.  It is currently being managed by diet and some medication changes.  He reports that he is doing well.  He denies any B symptoms.   Past Medical History  Diagnosis Date  . Diabetes mellitus   . Leukemia ([place]) 6.22.2012    PER PATIENT  . Hypertension   . Ulcer   . Anemia   . CLL (chronic lymphocytic leukemia) ([place]) 01/24/2011  . Melanoma in situ ([place]) 01/24/2011  . DM (diabetes mellitus) ([place]) 01/24/2011  . Mild obesity 01/24/2011  . Eczema 01/24/2011  . Port catheter  in place 05/02/2012  . Shingles   . History of pneumonia 04/2014  . B12 deficiency 12/07/2014    has CLL (chronic lymphocytic leukemia) ([place]); Melanoma in situ ([place]); DM (diabetes mellitus) ([place]); Mild obesity; Eczema; Port catheter in  place; Leukocytosis; AKI (acute kidney injury) ([place]); Hyperkalemia; Malnutrition of moderate degree ([place]); B12 deficiency; and HTN (hypertension) on his problem list.     is allergic to aspirin and ibuprofen.  We administered cyanocobalamin, heparin lock flush, and sodium chloride.  Past Surgical History  Procedure Laterality Date  . Hernia repair  2001  . Bleeding ulcer    . Portacath placement  2008  . Cataract extraction w/phaco  05/05/2012    Procedure: CATARACT EXTRACTION PHACO AND INTRAOCULAR LENS PLACEMENT (IOC);  Surgeon: Tonny Branch, MD;  Location: [facility];  Service: Ophthalmology;  Laterality: Right;  CDE:13.25  . Cataract extraction w/phaco  05/15/2012    Procedure: CATARACT EXTRACTION PHACO AND INTRAOCULAR LENS PLACEMENT (IOC);  Surgeon: Tonny Branch, MD;  Location: [facility];  Service: Ophthalmology;  Laterality: Left;  CDE:  12.32    Denies any headaches, dizziness, double vision, fevers, chills, night sweats, nausea, vomiting, diarrhea, constipation, chest pain, heart palpitations, shortness of breath, blood in stool, black tarry stool, urinary pain, urinary burning, urinary frequency, hematuria.   PHYSICAL EXAMINATION  ECOG PERFORMANCE STATUS: 1  Filed Vitals:   03/29/15 1308  BP: 145/76  Pulse: 60  Temp: 98 F (36.7 C)  Resp: 18    GENERAL:alert, no distress, well nourished, well developed, comfortable, cooperative and smiling SKIN: skin color, texture, turgor are normal, no rashes or significant lesions HEAD: Normocephalic, No masses, lesions, tenderness or abnormalities EYES: normal, PERRLA, EOMI, Conjunctiva are pink and non-injected EARS: External ears normal OROPHARYNX:lips, buccal mucosa, and tongue normal and mucous membranes are moist, edentulous  NECK: supple, no adenopathy, thyroid normal size, non-tender, without nodularity, no stridor, non-tender, trachea midline LYMPH:  no palpable lymphadenopathy BREAST:not examined LUNGS: clear to auscultation and  percussion HEART: regular rate & rhythm, no murmurs, no gallops, S1 normal and S2 normal ABDOMEN:abdomen soft, non-tender, normal bowel sounds and no masses or organomegaly BACK: Back symmetric, no curvature. EXTREMITIES:less then 2 second capillary refill, no joint deformities, effusion, or inflammation, no skin discoloration, no cyanosis.   NEURO: alert & oriented x 3 with fluent speech, no focal motor/sensory deficits, gait normal   LABORATORY DATA: CBC    Component Value Date/Time   WBC 8.1 03/01/2015 0415   RBC 3.11* 03/01/2015 0415   RBC 2.82* 05/21/2014 1422   HGB 10.1* 03/01/2015 0415   HCT 32.4* 03/01/2015 0415   PLT 160 03/01/2015 0415   MCV 104.2* 03/01/2015 0415   MCH 32.5 03/01/2015 0415   MCHC 31.2 03/01/2015 0415   RDW 15.5 03/01/2015 0415   LYMPHSABS 8.1* 02/28/2015 1201   MONOABS 2.6* 02/28/2015 1201   EOSABS 0.8* 02/28/2015 1201   BASOSABS 0.0 02/28/2015 1201      Chemistry      Component Value Date/Time   NA 138 03/29/2015 1235   K 4.4 03/29/2015 1235   CL 105 03/29/2015 1235   CO2 28 03/29/2015 1235   BUN 19 03/29/2015 1235   CREATININE 1.56* 03/29/2015 1235      Component Value Date/Time   CALCIUM 8.7* 03/29/2015 1235   CALCIUM 8.3* 02/03/2015 0639   ALKPHOS 92 03/29/2015 1235   AST 24 03/29/2015 1235   ALT 17 03/29/2015 1235   BILITOT 0.3 03/29/2015 1235  RADIOGRAPHIC STUDIES:  No results found.   ASSESSMENT AND PLAN:  CLL (chronic lymphocytic leukemia) ([place]) CLL, diagnosed in 2006 and per records was treated with FCR in 2010 secondary to a rapid doubling time of his wbc count up to 290K with diffuse LAD, hypogammaglobulinemia. He only received 3 cycles of FCR secondary to prolonged pancytopenia.  In Jan 2015, he was started on Imbruvica for a rising wbc and increased fatigue; other counts were relatively preserved. WBC count at that time was 36K. Therapy was interrupted secondary to rash. He was then started on Idelalisib on  07/31/2013. This therapy was held secondary to severe zoster outbreak diagnosed on 12/28/2013. Patient was given Varicella Zoster Immune Globulin IM on 01/13/2014. He was then restarted on Imbruvica on 02/08/2014. He was dose reduced secondary to abnormal LFT's and now therapy has been discontinued secondary to pneumonia and generalized decline. He is currently doing better off therapy.  Labs today: CBC diff, CMET  Labs in 4 and 8 weeks: CBC diff, CMET  Labs in 8 weeks: B12.  B12 injection today.  Supportive therapy plan reviewed.  Return in 8 weeks for follow-up.    THERAPY PLAN:  Continue observation.  All questions were answered. The patient knows to call the clinic with any problems, questions or concerns. We can certainly see the patient much sooner if necessary.  Patient and plan discussed with Dr. Ancil Linsey and she is in agreement with the aforementioned.   This note is electronically signed by: Robynn Pane 03/29/2015 2:14 PM

## 2015-03-27 NOTE — Assessment & Plan Note (Signed)
CLL, diagnosed in 2006 and per records was treated with FCR in 2010 secondary to a rapid doubling time of his wbc count up to 290K with diffuse LAD, hypogammaglobulinemia. He only received 3 cycles of FCR secondary to prolonged pancytopenia.  In Jan 2015, he was started on Imbruvica for a rising wbc and increased fatigue; other counts were relatively preserved. WBC count at that time was 36K. Therapy was interrupted secondary to rash. He was then started on Idelalisib on 07/31/2013. This therapy was held secondary to severe zoster outbreak diagnosed on 12/28/2013. Patient was given Varicella Zoster Immune Globulin IM on 01/13/2014. He was then restarted on Imbruvica on 02/08/2014. He was dose reduced secondary to abnormal LFT's and now therapy has been discontinued secondary to pneumonia and generalized decline. He is currently doing better off therapy.  Labs today: CBC diff, CMET  Labs in 4 and 8 weeks: CBC diff, CMET  Labs in 8 weeks: B12.  B12 injection today.  Supportive therapy plan reviewed.  Return in 8 weeks for follow-up.

## 2015-03-29 ENCOUNTER — Encounter (HOSPITAL_BASED_OUTPATIENT_CLINIC_OR_DEPARTMENT_OTHER): Payer: Medicare Other

## 2015-03-29 ENCOUNTER — Encounter (HOSPITAL_COMMUNITY): Payer: Medicare Other

## 2015-03-29 ENCOUNTER — Encounter (HOSPITAL_COMMUNITY): Payer: Medicare Other | Attending: Oncology | Admitting: Oncology

## 2015-03-29 VITALS — BP 145/76 | HR 60 | Temp 98.0°F | Resp 18 | Wt 129.2 lb

## 2015-03-29 DIAGNOSIS — E538 Deficiency of other specified B group vitamins: Secondary | ICD-10-CM

## 2015-03-29 DIAGNOSIS — M310 Hypersensitivity angiitis: Secondary | ICD-10-CM | POA: Diagnosis present

## 2015-03-29 DIAGNOSIS — D519 Vitamin B12 deficiency anemia, unspecified: Secondary | ICD-10-CM

## 2015-03-29 DIAGNOSIS — C9110 Chronic lymphocytic leukemia of B-cell type not having achieved remission: Secondary | ICD-10-CM | POA: Diagnosis not present

## 2015-03-29 LAB — COMPREHENSIVE METABOLIC PANEL
ALBUMIN: 3.5 g/dL (ref 3.5–5.0)
ALT: 17 U/L (ref 17–63)
AST: 24 U/L (ref 15–41)
Alkaline Phosphatase: 92 U/L (ref 38–126)
Anion gap: 5 (ref 5–15)
BILIRUBIN TOTAL: 0.3 mg/dL (ref 0.3–1.2)
BUN: 19 mg/dL (ref 6–20)
CO2: 28 mmol/L (ref 22–32)
CREATININE: 1.56 mg/dL — AB (ref 0.61–1.24)
Calcium: 8.7 mg/dL — ABNORMAL LOW (ref 8.9–10.3)
Chloride: 105 mmol/L (ref 101–111)
GFR calc Af Amer: 48 mL/min — ABNORMAL LOW (ref >60)
GFR calc non Af Amer: 41 mL/min — ABNORMAL LOW (ref >60)
GLUCOSE: 105 mg/dL — AB (ref 65–99)
POTASSIUM: 4.4 mmol/L (ref 3.5–5.1)
Sodium: 138 mmol/L (ref 135–145)
TOTAL PROTEIN: 8.3 g/dL — AB (ref 6.5–8.1)

## 2015-03-29 LAB — CBC WITH DIFFERENTIAL/PLATELET
BASOS ABS: 0 10*3/uL (ref 0.0–0.1)
Basophils Relative: 0 %
EOS PCT: 15 %
Eosinophils Absolute: 1.8 10*3/uL — ABNORMAL HIGH (ref 0.0–0.7)
HEMATOCRIT: 36.2 % — AB (ref 39.0–52.0)
HEMOGLOBIN: 11.6 g/dL — AB (ref 13.0–17.0)
LYMPHS ABS: 6.3 10*3/uL — AB (ref 0.7–4.0)
LYMPHS PCT: 55 %
MCH: 32.7 pg (ref 26.0–34.0)
MCHC: 32 g/dL (ref 30.0–36.0)
MCV: 102 fL — ABNORMAL HIGH (ref 78.0–100.0)
Monocytes Absolute: 1.6 10*3/uL — ABNORMAL HIGH (ref 0.1–1.0)
Monocytes Relative: 14 %
NEUTROS ABS: 1.8 10*3/uL (ref 1.7–7.7)
NEUTROS PCT: 16 %
Platelets: 188 10*3/uL (ref 150–400)
RBC: 3.55 MIL/uL — AB (ref 4.22–5.81)
RDW: 14.1 % (ref 11.5–15.5)
WBC: 11.6 10*3/uL — AB (ref 4.0–10.5)

## 2015-03-29 MED ORDER — SODIUM CHLORIDE 0.9 % IJ SOLN
10.0000 mL | INTRAMUSCULAR | Status: DC | PRN
  Administered 2015-03-29: 10 mL via INTRAVENOUS
  Filled 2015-03-29: qty 10

## 2015-03-29 MED ORDER — HEPARIN SOD (PORK) LOCK FLUSH 100 UNIT/ML IV SOLN
500.0000 [IU] | Freq: Once | INTRAVENOUS | Status: AC
  Administered 2015-03-29: 500 [IU] via INTRAVENOUS

## 2015-03-29 MED ORDER — CYANOCOBALAMIN 1000 MCG/ML IJ SOLN
1000.0000 ug | Freq: Once | INTRAMUSCULAR | Status: AC
  Administered 2015-03-29: 1000 ug via INTRAMUSCULAR

## 2015-03-29 MED ORDER — HEPARIN SOD (PORK) LOCK FLUSH 100 UNIT/ML IV SOLN
INTRAVENOUS | Status: AC
Start: 2015-03-29 — End: 2015-03-29
  Filled 2015-03-29: qty 5

## 2015-03-29 MED ORDER — CYANOCOBALAMIN 1000 MCG/ML IJ SOLN
INTRAMUSCULAR | Status: AC
  Filled 2015-03-29: qty 1

## 2015-03-29 NOTE — Progress Notes (Signed)
See office visit encounter. 

## 2015-03-29 NOTE — Patient Instructions (Signed)
[place] at [facility] Discharge Instructions  RECOMMENDATIONS MADE BY THE CONSULTANT AND ANY TEST RESULTS WILL BE SENT TO YOUR REFERRING PHYSICIAN.  Exam and discussion by Robynn Pane, PA-C If any concerns with your labs we will let you know. B12 injection and port flush today.  Follow-up: Labs monthly B12 monthly Port flushes every 8 weeks. Office visit in 8 weeks.  Thank you for choosing [place] at [facility] to provide your oncology and hematology care.  To afford each patient quality time with our provider, please arrive at least 15 minutes before your scheduled appointment time.    You need to re-schedule your appointment should you arrive 10 or more minutes late.  We strive to give you quality time with our providers, and arriving late affects you and other patients whose appointments are after yours.  Also, if you no show three or more times for appointments you may be dismissed from the clinic at the providers discretion.     Again, thank you for choosing [facility].  Our hope is that these requests will decrease the amount of time that you wait before being seen by our physicians.       _____________________________________________________________  Should you have questions after your visit to [facility], please contact our office at [phone] between the hours of 8:30 a.m. and 4:30 p.m.  Voicemails left after 4:30 p.m. will not be returned until the following business day.  For prescription refill requests, have your pharmacy contact our office.

## 2015-03-29 NOTE — Progress Notes (Signed)
..  Randall Reyes presents today for injection per the provider's orders.  Vitamin b12 administration without incident; see MAR for injection details.  Patient tolerated procedure well and without incident.  No questions or complaints noted at this time. Randall [name] presented for Portacath access and flush.  Proper placement of portacath confirmed by CXR.  Portacath located rt chest wall accessed with  H 20 needle.  No blood return present. Portacath flushed with 65ml NS and 500U/73ml Heparin and needle removed intact.  Procedure tolerated well and without incident.

## 2015-03-31 NOTE — Progress Notes (Signed)
LABS DRAWN

## 2015-04-26 ENCOUNTER — Encounter (HOSPITAL_COMMUNITY): Payer: Medicare Other

## 2015-04-26 ENCOUNTER — Encounter (HOSPITAL_COMMUNITY): Payer: Medicare Other | Attending: Oncology

## 2015-04-26 VITALS — BP 130/86 | HR 62 | Temp 98.4°F | Resp 20

## 2015-04-26 DIAGNOSIS — C9110 Chronic lymphocytic leukemia of B-cell type not having achieved remission: Secondary | ICD-10-CM

## 2015-04-26 DIAGNOSIS — E538 Deficiency of other specified B group vitamins: Secondary | ICD-10-CM

## 2015-04-26 DIAGNOSIS — D519 Vitamin B12 deficiency anemia, unspecified: Secondary | ICD-10-CM

## 2015-04-26 DIAGNOSIS — M310 Hypersensitivity angiitis: Secondary | ICD-10-CM | POA: Diagnosis present

## 2015-04-26 LAB — COMPREHENSIVE METABOLIC PANEL
ALBUMIN: 3.6 g/dL (ref 3.5–5.0)
ALK PHOS: 79 U/L (ref 38–126)
ALT: 17 U/L (ref 17–63)
ANION GAP: 5 (ref 5–15)
AST: 26 U/L (ref 15–41)
BUN: 25 mg/dL — AB (ref 6–20)
CALCIUM: 8.7 mg/dL — AB (ref 8.9–10.3)
CO2: 27 mmol/L (ref 22–32)
Chloride: 108 mmol/L (ref 101–111)
Creatinine, Ser: 1.85 mg/dL — ABNORMAL HIGH (ref 0.61–1.24)
GFR calc Af Amer: 39 mL/min — ABNORMAL LOW (ref >60)
GFR calc non Af Amer: 33 mL/min — ABNORMAL LOW (ref >60)
GLUCOSE: 116 mg/dL — AB (ref 65–99)
Potassium: 4 mmol/L (ref 3.5–5.1)
SODIUM: 140 mmol/L (ref 135–145)
Total Bilirubin: 0.3 mg/dL (ref 0.3–1.2)
Total Protein: 8.3 g/dL — ABNORMAL HIGH (ref 6.5–8.1)

## 2015-04-26 LAB — CBC WITH DIFFERENTIAL/PLATELET
Basophils Absolute: 0 10*3/uL (ref 0.0–0.1)
Basophils Relative: 0 %
Eosinophils Absolute: 1 10*3/uL — ABNORMAL HIGH (ref 0.0–0.7)
Eosinophils Relative: 10 %
HEMATOCRIT: 36.5 % — AB (ref 39.0–52.0)
HEMOGLOBIN: 11.8 g/dL — AB (ref 13.0–17.0)
LYMPHS ABS: 6 10*3/uL — AB (ref 0.7–4.0)
Lymphocytes Relative: 60 %
MCH: 32.8 pg (ref 26.0–34.0)
MCHC: 32.3 g/dL (ref 30.0–36.0)
MCV: 101.4 fL — AB (ref 78.0–100.0)
MONO ABS: 1.4 10*3/uL — AB (ref 0.1–1.0)
MONOS PCT: 14 %
NEUTROS ABS: 1.7 10*3/uL (ref 1.7–7.7)
NEUTROS PCT: 17 %
Platelets: 203 10*3/uL (ref 150–400)
RBC: 3.6 MIL/uL — ABNORMAL LOW (ref 4.22–5.81)
RDW: 14.1 % (ref 11.5–15.5)
WBC: 10 10*3/uL (ref 4.0–10.5)

## 2015-04-26 MED ORDER — CYANOCOBALAMIN 1000 MCG/ML IJ SOLN
1000.0000 ug | Freq: Once | INTRAMUSCULAR | Status: AC
  Administered 2015-04-26: 1000 ug via INTRAMUSCULAR
  Filled 2015-04-26: qty 1

## 2015-04-26 NOTE — Patient Instructions (Signed)
[facility] at [facility] Discharge Instructions  RECOMMENDATIONS MADE BY THE CONSULTANT AND ANY TEST RESULTS WILL BE SENT TO YOUR REFERRING PHYSICIAN.  Vitamin B12 1000 mcg injection given today as ordered. Return as scheduled.  Thank you for choosing [facility] at [facility] to provide your oncology and hematology care.  To afford each patient quality time with our provider, please arrive at least 15 minutes before your scheduled appointment time.    You need to re-schedule your appointment should you arrive 10 or more minutes late.  We strive to give you quality time with our providers, and arriving late affects you and other patients whose appointments are after yours.  Also, if you no show three or more times for appointments you may be dismissed from the clinic at the providers discretion.     Again, thank you for choosing [facility].  Our hope is that these requests will decrease the amount of time that you wait before being seen by our physicians.       _____________________________________________________________  Should you have questions after your visit to [facility], please contact our office at [phone] between the hours of 8:30 a.m. and 4:30 p.m.  Voicemails left after 4:30 p.m. will not be returned until the following business day.  For prescription refill requests, have your pharmacy contact our office.    

## 2015-04-26 NOTE — Progress Notes (Signed)
Randall Reyes presents today for injection per MD orders. B12 1000 mcg administered IM in left Upper Arm. Administration without incident. Patient tolerated well.  

## 2015-05-22 NOTE — Assessment & Plan Note (Addendum)
CLL, diagnosed in 2006 and per records was treated with FCR in 2010 secondary to a rapid doubling time of his wbc count up to 290K with diffuse LAD, hypogammaglobulinemia. He only received 3 cycles of FCR secondary to prolonged pancytopenia.  In Jan 2015, he was started on Imbruvica for a rising wbc and increased fatigue; other counts were relatively preserved. WBC count at that time was 36K. Therapy was interrupted secondary to rash. He was then started on Idelalisib on 07/31/2013. This therapy was held secondary to severe zoster outbreak diagnosed on 12/28/2013. Patient was given Varicella Zoster Immune Globulin IM on 01/13/2014. He was then restarted on Imbruvica on 02/08/2014. He was dose reduced secondary to abnormal LFT's and now therapy has been discontinued secondary to pneumonia and generalized decline. He is currently doing better off therapy.  Labs today: CBC diff, CMET  Labs in 4, 8, and 12 weeks weeks: CBC diff, CMET  Labs in 8 weeks: B12.  Labs in 12 weeks: ESR, CRP, LDH  B12 injection today.  Supportive therapy plan reviewed.  He continues with Kayexalate twice per week.  Handicapped placard sheet filled out for the patient.  Return in 12 weeks for follow-up.

## 2015-05-22 NOTE — Progress Notes (Signed)
Randall Hampshire, MD [place] [address]  CLL (chronic lymphocytic leukemia) [facility]) - Plan: Vitamin B12, heparin lock flush 100 unit/mL, sodium chloride 0.9 % injection 10 mL, Sedimentation rate, Lactate dehydrogenase, C-reactive protein  B12 deficiency - Plan: cyanocobalamin ((VITAMIN B-12)) injection 1,000 mcg  CURRENT THERAPY: Observation and monthly B12 injections since June 2016.  INTERVAL HISTORY: Randall Reyes 80 y.o. male returns for followup of CLL. AND B12 deficiency    CLL (chronic lymphocytic leukemia) ([place])   02/14/2005 Procedure Peripheral flow cytometry- CLL (without 11q or 17 p deletion) presenting with stage 0 disease   10/25/2008 Progression Increasing WBC and symptoms suggestive of progressive disease   11/09/2008 - 01/07/2009 Chemotherapy Rituxan, fludarabine, cytoxan x 3 cycles with the discontinuation of fludarabine and cytoxan due to severe pancytopenia requiring hospitalization.   01/10/2009 Adverse Reaction Severe pancytopenia and adverse side effects   02/01/2009 - 04/01/2009 Chemotherapy Rituxan every 21 days x 3 cycles   04/02/2009 Remission Stable disease.  Chemotherapy holiday started   06/22/2013 - 06/29/2013 Chemotherapy Imbruvica daily   06/29/2013 Adverse Reaction Large right posterior leg (popliteal) ecchymosis/violaceous rash.  Likely Imbruvica-induced   07/31/2013 - 10/12/2013 Chemotherapy Zydelig (idelalisib) 150 mg BID monotherapy   10/12/2013 Adverse Reaction Transaminitis, suspected to be secondary to [place].  Dose decreased.   10/12/2013 - 11/09/2013 Chemotherapy Zydelig (idelalisib) 150 mg daily.   11/09/2013 Adverse Reaction Worsening transaminitis.  Zydelig on HOLD   11/09/2013 Treatment Plan Change Hold Zydelig   12/28/2013 Adverse Reaction Herpes Zoster outbreak.  Partial response with Famvir x 1 week, switched to acyclovir.  Progression over 1-2 weeks with development of keratoconjunctivitis bilaterally.  VZ Immune  Globulin 625 mg IM given on 01/13/14.   02/08/2014 Treatment Plan Change Ibrutinib restarted 140 mg daily for one week followed by 280 mg daily followed by 420 mg daily with prophylactic acyclovir.   02/08/2014 - 07/05/2014 Chemotherapy Ibrutinib 420 mg daily   129-May-202015 - 04/17/2014 Hospital Admission Right lower lobe pneumonia   07/05/2014 Adverse Reaction Fissuring of fingertips and sore, red feet with transaminitis   07/05/2014 Treatment Plan Change Hold Ibrutinib   07/22/2014 - 08/19/2014 Chemotherapy Ibrutinib restarted at 280 mg daily.   08/09/2014 - 08/12/2014 Hospital Admission Bilateral pneumonia   08/19/2014 Adverse Reaction Nausea with vomiting.  Escorted to ED.   08/19/2014 Treatment Plan Change Ibrutinib on hold- ED escort from clinic    10/18/2014 Miscellaneous B12 weekly x 4 then monthly.     I personally reviewed and went over laboratory results with the patient.  The results are noted within this dictation.   He will start seeing Randall Reyes who has taken over Randall Reyes' practice.  He has an appointment upcoming with him.    He denies any B symptoms.   His appetite is strong and his weight is stable.  He continues with Kayexalate twice weekly and this is managed by nephrology.  Past Medical History  Diagnosis Date  . Diabetes mellitus   . Leukemia ([place]) 6.22.2012    PER PATIENT  . Hypertension   . Ulcer   . Anemia   . CLL (chronic lymphocytic leukemia) ([place]) 01/24/2011  . Melanoma in situ ([place]) 01/24/2011  . DM (diabetes mellitus) ([place]) 01/24/2011  . Mild obesity 01/24/2011  . Eczema 01/24/2011  . Port catheter in place 05/02/2012  . Shingles   . History of pneumonia 04/2014  . B12 deficiency 12/07/2014    has CLL (chronic lymphocytic  leukemia) ([place]); Melanoma in situ ([place]); DM (diabetes mellitus) ([place]); Mild obesity; Eczema; Port catheter in place; Leukocytosis; AKI (acute kidney injury) ([place]); Hyperkalemia; Malnutrition of moderate degree ([place]); B12 deficiency; and HTN  (hypertension) on his problem list.     is allergic to aspirin and ibuprofen.  We administered cyanocobalamin, heparin lock flush, and sodium chloride.  Past Surgical History  Procedure Laterality Date  . Hernia repair  2001  . Bleeding ulcer    . Portacath placement  2008  . Cataract extraction w/phaco  05/05/2012    Procedure: CATARACT EXTRACTION PHACO AND INTRAOCULAR LENS PLACEMENT (IOC);  Surgeon: Randall Branch, MD;  Location: [facility];  Service: Ophthalmology;  Laterality: Right;  CDE:13.25  . Cataract extraction w/phaco  05/15/2012    Procedure: CATARACT EXTRACTION PHACO AND INTRAOCULAR LENS PLACEMENT (IOC);  Surgeon: Randall Branch, MD;  Location: [facility];  Service: Ophthalmology;  Laterality: Left;  CDE:  12.32    Denies any headaches, dizziness, double vision, fevers, chills, night sweats, nausea, vomiting, diarrhea, constipation, chest pain, heart palpitations, shortness of breath, blood in stool, black tarry stool, urinary pain, urinary burning, urinary frequency, hematuria.   PHYSICAL EXAMINATION  ECOG PERFORMANCE STATUS: 0  Filed Vitals:   05/24/15 1321  BP: 122/69  Pulse: 65  Temp: 97.5 F (36.4 C)  Resp: 18    GENERAL:alert, no distress, well nourished, well developed, comfortable, cooperative and smiling, accompanied by his wife. SKIN: skin color, texture, turgor are normal, no rashes or significant lesions HEAD: Normocephalic, No masses, lesions, tenderness or abnormalities EYES: normal, PERRLA, EOMI, Conjunctiva are pink and non-injected EARS: External ears normal OROPHARYNX:lips, buccal mucosa, and tongue normal and mucous membranes are moist, edentulous  NECK: supple, no adenopathy, thyroid normal size, non-tender, without nodularity, no stridor, non-tender, trachea midline LYMPH:  no palpable lymphadenopathy BREAST:not examined LUNGS: clear to auscultation and percussion HEART: regular rate & rhythm, no murmurs, no gallops, S1 normal and S2 normal ABDOMEN:abdomen  soft, non-tender, normal bowel sounds and no masses or organomegaly BACK: Back symmetric, no curvature. EXTREMITIES:less then 2 second capillary refill, no joint deformities, effusion, or inflammation, no skin discoloration, no cyanosis.   NEURO: alert & oriented x 3 with fluent speech, no focal motor/sensory deficits, gait normal   LABORATORY DATA: CBC    Component Value Date/Time   WBC 11.7* 05/24/2015 1245   RBC 3.62* 05/24/2015 1245   RBC 2.82* 05/21/2014 1422   HGB 11.8* 05/24/2015 1245   HCT 37.0* 05/24/2015 1245   PLT 192 05/24/2015 1245   MCV 102.2* 05/24/2015 1245   MCH 32.6 05/24/2015 1245   MCHC 31.9 05/24/2015 1245   RDW 14.6 05/24/2015 1245   LYMPHSABS 7.1* 05/24/2015 1245   MONOABS 1.5* 05/24/2015 1245   EOSABS 1.2* 05/24/2015 1245   BASOSABS 0.0 05/24/2015 1245      Chemistry      Component Value Date/Time   NA 142 05/24/2015 1245   K 4.2 05/24/2015 1245   CL 105 05/24/2015 1245   CO2 29 05/24/2015 1245   BUN 28* 05/24/2015 1245   CREATININE 2.03* 05/24/2015 1245      Component Value Date/Time   CALCIUM 8.7* 05/24/2015 1245   CALCIUM 8.3* 02/03/2015 0639   ALKPHOS 76 05/24/2015 1245   AST 19 05/24/2015 1245   ALT 12* 05/24/2015 1245   BILITOT 0.4 05/24/2015 1245       RADIOGRAPHIC STUDIES:  No results found.   ASSESSMENT AND PLAN:  CLL (chronic lymphocytic leukemia) ([place]) CLL, diagnosed in 2006  and per records was treated with FCR in 2010 secondary to a rapid doubling time of his wbc count up to 290K with diffuse LAD, hypogammaglobulinemia. He only received 3 cycles of FCR secondary to prolonged pancytopenia.  In Jan 2015, he was started on Imbruvica for a rising wbc and increased fatigue; other counts were relatively preserved. WBC count at that time was 36K. Therapy was interrupted secondary to rash. He was then started on Idelalisib on 07/31/2013. This therapy was held secondary to severe zoster outbreak diagnosed on 12/28/2013. Patient was given  Varicella Zoster Immune Globulin IM on 01/13/2014. He was then restarted on Imbruvica on 02/08/2014. He was dose reduced secondary to abnormal LFT's and now therapy has been discontinued secondary to pneumonia and generalized decline. He is currently doing better off therapy.  Labs today: CBC diff, CMET  Labs in 4, 8, and 12 weeks weeks: CBC diff, CMET  Labs in 8 weeks: B12.  Labs in 12 weeks: ESR, CRP, LDH  B12 injection today.  Supportive therapy plan reviewed.  He continues with Kayexalate twice per week.  Handicapped placard sheet filled out for the patient.  Return in 12 weeks for follow-up.    THERAPY PLAN:  Continue observation.  All questions were answered. The patient knows to call the clinic with any problems, questions or concerns. We can certainly see the patient much sooner if necessary.  Patient and plan discussed with Dr. Ancil Linsey and she is in agreement with the aforementioned.   This note is electronically signed by: Robynn Pane 05/24/2015 4:56 PM

## 2015-05-24 ENCOUNTER — Encounter (HOSPITAL_COMMUNITY): Payer: Medicare Other

## 2015-05-24 ENCOUNTER — Encounter (HOSPITAL_COMMUNITY): Payer: Self-pay | Admitting: Oncology

## 2015-05-24 ENCOUNTER — Encounter (HOSPITAL_COMMUNITY): Payer: Medicare Other | Attending: Oncology | Admitting: Oncology

## 2015-05-24 VITALS — BP 122/69 | HR 65 | Temp 97.5°F | Resp 18 | Wt 137.4 lb

## 2015-05-24 DIAGNOSIS — C9110 Chronic lymphocytic leukemia of B-cell type not having achieved remission: Secondary | ICD-10-CM

## 2015-05-24 DIAGNOSIS — E538 Deficiency of other specified B group vitamins: Secondary | ICD-10-CM | POA: Diagnosis not present

## 2015-05-24 DIAGNOSIS — D519 Vitamin B12 deficiency anemia, unspecified: Secondary | ICD-10-CM

## 2015-05-24 LAB — COMPREHENSIVE METABOLIC PANEL
ALBUMIN: 3.6 g/dL (ref 3.5–5.0)
ALK PHOS: 76 U/L (ref 38–126)
ALT: 12 U/L — ABNORMAL LOW (ref 17–63)
ANION GAP: 8 (ref 5–15)
AST: 19 U/L (ref 15–41)
BUN: 28 mg/dL — ABNORMAL HIGH (ref 6–20)
CALCIUM: 8.7 mg/dL — AB (ref 8.9–10.3)
CO2: 29 mmol/L (ref 22–32)
Chloride: 105 mmol/L (ref 101–111)
Creatinine, Ser: 2.03 mg/dL — ABNORMAL HIGH (ref 0.61–1.24)
GFR calc non Af Amer: 30 mL/min — ABNORMAL LOW (ref >60)
GFR, EST AFRICAN AMERICAN: 35 mL/min — AB (ref >60)
Glucose, Bld: 109 mg/dL — ABNORMAL HIGH (ref 65–99)
POTASSIUM: 4.2 mmol/L (ref 3.5–5.1)
SODIUM: 142 mmol/L (ref 135–145)
TOTAL PROTEIN: 8.2 g/dL — AB (ref 6.5–8.1)
Total Bilirubin: 0.4 mg/dL (ref 0.3–1.2)

## 2015-05-24 LAB — CBC WITH DIFFERENTIAL/PLATELET
BASOS ABS: 0 10*3/uL (ref 0.0–0.1)
BASOS PCT: 0 %
EOS ABS: 1.2 10*3/uL — AB (ref 0.0–0.7)
Eosinophils Relative: 10 %
HCT: 37 % — ABNORMAL LOW (ref 39.0–52.0)
HEMOGLOBIN: 11.8 g/dL — AB (ref 13.0–17.0)
LYMPHS ABS: 7.1 10*3/uL — AB (ref 0.7–4.0)
LYMPHS PCT: 61 %
MCH: 32.6 pg (ref 26.0–34.0)
MCHC: 31.9 g/dL (ref 30.0–36.0)
MCV: 102.2 fL — ABNORMAL HIGH (ref 78.0–100.0)
MONO ABS: 1.5 10*3/uL — AB (ref 0.1–1.0)
Monocytes Relative: 13 %
NEUTROS ABS: 1.9 10*3/uL (ref 1.7–7.7)
Neutrophils Relative %: 16 %
Platelets: 192 10*3/uL (ref 150–400)
RBC: 3.62 MIL/uL — ABNORMAL LOW (ref 4.22–5.81)
RDW: 14.6 % (ref 11.5–15.5)
WBC: 11.7 10*3/uL — ABNORMAL HIGH (ref 4.0–10.5)

## 2015-05-24 MED ORDER — CYANOCOBALAMIN 1000 MCG/ML IJ SOLN
1000.0000 ug | Freq: Once | INTRAMUSCULAR | Status: AC
  Administered 2015-05-24: 1000 ug via INTRAMUSCULAR

## 2015-05-24 MED ORDER — CYANOCOBALAMIN 1000 MCG/ML IJ SOLN
INTRAMUSCULAR | Status: AC
  Filled 2015-05-24: qty 1

## 2015-05-24 MED ORDER — HEPARIN SOD (PORK) LOCK FLUSH 100 UNIT/ML IV SOLN
500.0000 [IU] | Freq: Once | INTRAVENOUS | Status: AC
  Administered 2015-05-24: 500 [IU] via INTRAVENOUS

## 2015-05-24 MED ORDER — SODIUM CHLORIDE 0.9 % IJ SOLN
10.0000 mL | INTRAMUSCULAR | Status: DC | PRN
  Administered 2015-05-24: 10 mL via INTRAVENOUS
  Filled 2015-05-24: qty 10

## 2015-05-24 MED ORDER — HEPARIN SOD (PORK) LOCK FLUSH 100 UNIT/ML IV SOLN
INTRAVENOUS | Status: AC
  Filled 2015-05-24: qty 5

## 2015-05-24 NOTE — Progress Notes (Signed)
Randall Reyes presents today for injection per the provider's orders.  B12 administration without incident; see MAR for injection details.  Patient tolerated procedure well and without incident.  No questions or complaints noted at this time.  Randall Reyes presented for Portacath access and flush.  Portacath located right chest wall accessed with  H 20 needle.  No blood return - flushes w/o difficulty with no c/o pain, no swelling at site. Portacath flushed with 23ml NS and 500U/18ml Heparin and needle removed intact.  Procedure tolerated well and without incident.

## 2015-05-24 NOTE — Progress Notes (Signed)
See office visit encounter. 

## 2015-05-24 NOTE — Patient Instructions (Addendum)
[place] at [facility] Discharge Instructions  RECOMMENDATIONS MADE BY THE CONSULTANT AND ANY TEST RESULTS WILL BE SENT TO YOUR REFERRING PHYSICIAN.  Exam and discussion today with T. Kefalas, PA-C. Monthly B12 injections. Lab work in 4 weeks, 8 weeks, and 12 weeks. Office visit in 3 months with Dr. [name].   Thank you for choosing [place] at [facility] to provide your oncology and hematology care.  To afford each patient quality time with our provider, please arrive at least 15 minutes before your scheduled appointment time.    You need to re-schedule your appointment should you arrive 10 or more minutes late.  We strive to give you quality time with our providers, and arriving late affects you and other patients whose appointments are after yours.  Also, if you no show three or more times for appointments you may be dismissed from the clinic at the providers discretion.     Again, thank you for choosing [facility].  Our hope is that these requests will decrease the amount of time that you wait before being seen by our physicians.       _____________________________________________________________  Should you have questions after your visit to [facility], please contact our office at [phone] between the hours of 8:30 a.m. and 4:30 p.m.  Voicemails left after 4:30 p.m. will not be returned until the following business day.  For prescription refill requests, have your pharmacy contact our office.

## 2015-06-21 ENCOUNTER — Encounter (HOSPITAL_COMMUNITY): Payer: Medicare Other | Attending: Oncology

## 2015-06-21 ENCOUNTER — Encounter (HOSPITAL_COMMUNITY): Payer: Medicare Other

## 2015-06-21 VITALS — BP 121/84 | HR 65 | Temp 98.1°F | Resp 18

## 2015-06-21 DIAGNOSIS — E538 Deficiency of other specified B group vitamins: Secondary | ICD-10-CM

## 2015-06-21 DIAGNOSIS — C9110 Chronic lymphocytic leukemia of B-cell type not having achieved remission: Secondary | ICD-10-CM

## 2015-06-21 DIAGNOSIS — M310 Hypersensitivity angiitis: Secondary | ICD-10-CM | POA: Insufficient documentation

## 2015-06-21 DIAGNOSIS — D519 Vitamin B12 deficiency anemia, unspecified: Secondary | ICD-10-CM

## 2015-06-21 LAB — COMPREHENSIVE METABOLIC PANEL
ALBUMIN: 3.5 g/dL (ref 3.5–5.0)
ALK PHOS: 70 U/L (ref 38–126)
ALT: 15 U/L — AB (ref 17–63)
ANION GAP: 7 (ref 5–15)
AST: 23 U/L (ref 15–41)
BILIRUBIN TOTAL: 0.2 mg/dL — AB (ref 0.3–1.2)
BUN: 23 mg/dL — ABNORMAL HIGH (ref 6–20)
CALCIUM: 8.5 mg/dL — AB (ref 8.9–10.3)
CO2: 28 mmol/L (ref 22–32)
CREATININE: 1.64 mg/dL — AB (ref 0.61–1.24)
Chloride: 105 mmol/L (ref 101–111)
GFR calc Af Amer: 45 mL/min — ABNORMAL LOW (ref >60)
GFR calc non Af Amer: 39 mL/min — ABNORMAL LOW (ref >60)
Glucose, Bld: 110 mg/dL — ABNORMAL HIGH (ref 65–99)
Potassium: 4 mmol/L (ref 3.5–5.1)
SODIUM: 140 mmol/L (ref 135–145)
Total Protein: 8.3 g/dL — ABNORMAL HIGH (ref 6.5–8.1)

## 2015-06-21 LAB — CBC WITH DIFFERENTIAL/PLATELET
Basophils Absolute: 0 10*3/uL (ref 0.0–0.1)
Basophils Relative: 0 %
Eosinophils Absolute: 0.9 10*3/uL — ABNORMAL HIGH (ref 0.0–0.7)
Eosinophils Relative: 8 %
HEMATOCRIT: 35.9 % — AB (ref 39.0–52.0)
HEMOGLOBIN: 11.7 g/dL — AB (ref 13.0–17.0)
LYMPHS PCT: 66 %
Lymphs Abs: 7.3 10*3/uL — ABNORMAL HIGH (ref 0.7–4.0)
MCH: 32.7 pg (ref 26.0–34.0)
MCHC: 32.6 g/dL (ref 30.0–36.0)
MCV: 100.3 fL — ABNORMAL HIGH (ref 78.0–100.0)
MONO ABS: 0.9 10*3/uL (ref 0.1–1.0)
MONOS PCT: 8 %
NEUTROS ABS: 1.9 10*3/uL (ref 1.7–7.7)
NEUTROS PCT: 18 %
Platelets: 153 10*3/uL (ref 150–400)
RBC: 3.58 MIL/uL — ABNORMAL LOW (ref 4.22–5.81)
RDW: 14.9 % (ref 11.5–15.5)
WBC: 11.1 10*3/uL — ABNORMAL HIGH (ref 4.0–10.5)

## 2015-06-21 MED ORDER — CYANOCOBALAMIN 1000 MCG/ML IJ SOLN
INTRAMUSCULAR | Status: AC
  Filled 2015-06-21: qty 1

## 2015-06-21 MED ORDER — CYANOCOBALAMIN 1000 MCG/ML IJ SOLN
1000.0000 ug | Freq: Once | INTRAMUSCULAR | Status: AC
  Administered 2015-06-21: 1000 ug via INTRAMUSCULAR

## 2015-06-21 NOTE — Progress Notes (Signed)
Randall Reyes presents today for injection per MD orders. B12 1000mcg administered IM in right Upper Arm. Administration without incident. Patient tolerated well.  

## 2015-06-21 NOTE — Patient Instructions (Signed)
[facility] at [facility] Discharge Instructions  RECOMMENDATIONS MADE BY THE CONSULTANT AND ANY TEST RESULTS WILL BE SENT TO YOUR REFERRING PHYSICIAN.  Vitamin B12 1000 mcg injection given today as ordered. Return as scheduled.  Thank you for choosing [facility] at [facility] to provide your oncology and hematology care.  To afford each patient quality time with our provider, please arrive at least 15 minutes before your scheduled appointment time.   Beginning January 23rd 2017 lab work for the Cancer Center will be done in the  Main lab at [place] on 1st floor. If you have a lab appointment with the Cancer Center please come in thru the  Main Entrance and check in at the main information desk  You need to re-schedule your appointment should you arrive 10 or more minutes late.  We strive to give you quality time with our providers, and arriving late affects you and other patients whose appointments are after yours.  Also, if you no show three or more times for appointments you may be dismissed from the clinic at the providers discretion.     Again, thank you for choosing [facility].  Our hope is that these requests will decrease the amount of time that you wait before being seen by our physicians.       _____________________________________________________________  Should you have questions after your visit to [facility], please contact our office at [phone] between the hours of 8:30 a.m. and 4:30 p.m.  Voicemails left after 4:30 p.m. will not be returned until the following business day.  For prescription refill requests, have your pharmacy contact our office.    

## 2015-07-19 ENCOUNTER — Other Ambulatory Visit (HOSPITAL_COMMUNITY): Payer: Self-pay | Admitting: *Deleted

## 2015-07-19 ENCOUNTER — Encounter (HOSPITAL_COMMUNITY): Payer: Medicare Other | Attending: Oncology

## 2015-07-19 ENCOUNTER — Encounter (HOSPITAL_COMMUNITY): Payer: Medicare Other

## 2015-07-19 ENCOUNTER — Encounter (HOSPITAL_BASED_OUTPATIENT_CLINIC_OR_DEPARTMENT_OTHER): Payer: Medicare Other

## 2015-07-19 VITALS — BP 147/68 | HR 56 | Temp 97.9°F | Resp 16

## 2015-07-19 DIAGNOSIS — E538 Deficiency of other specified B group vitamins: Secondary | ICD-10-CM | POA: Diagnosis not present

## 2015-07-19 DIAGNOSIS — C9110 Chronic lymphocytic leukemia of B-cell type not having achieved remission: Secondary | ICD-10-CM | POA: Insufficient documentation

## 2015-07-19 DIAGNOSIS — M310 Hypersensitivity angiitis: Secondary | ICD-10-CM | POA: Diagnosis present

## 2015-07-19 DIAGNOSIS — D519 Vitamin B12 deficiency anemia, unspecified: Secondary | ICD-10-CM

## 2015-07-19 DIAGNOSIS — Z95828 Presence of other vascular implants and grafts: Secondary | ICD-10-CM

## 2015-07-19 LAB — COMPREHENSIVE METABOLIC PANEL
ALBUMIN: 3.8 g/dL (ref 3.5–5.0)
ALK PHOS: 76 U/L (ref 38–126)
ALT: 14 U/L — ABNORMAL LOW (ref 17–63)
AST: 19 U/L (ref 15–41)
Anion gap: 5 (ref 5–15)
BILIRUBIN TOTAL: 0.4 mg/dL (ref 0.3–1.2)
BUN: 23 mg/dL — AB (ref 6–20)
CALCIUM: 8.9 mg/dL (ref 8.9–10.3)
CO2: 29 mmol/L (ref 22–32)
CREATININE: 1.74 mg/dL — AB (ref 0.61–1.24)
Chloride: 105 mmol/L (ref 101–111)
GFR calc Af Amer: 41 mL/min — ABNORMAL LOW (ref >60)
GFR, EST NON AFRICAN AMERICAN: 36 mL/min — AB (ref >60)
GLUCOSE: 101 mg/dL — AB (ref 65–99)
Potassium: 5.2 mmol/L — ABNORMAL HIGH (ref 3.5–5.1)
Sodium: 139 mmol/L (ref 135–145)
TOTAL PROTEIN: 8.5 g/dL — AB (ref 6.5–8.1)

## 2015-07-19 LAB — CBC WITH DIFFERENTIAL/PLATELET
BASOS ABS: 0 10*3/uL (ref 0.0–0.1)
BASOS PCT: 0 %
BLASTS: 0 %
Band Neutrophils: 0 %
EOS ABS: 0.9 10*3/uL — AB (ref 0.0–0.7)
Eosinophils Relative: 6 %
HEMATOCRIT: 38 % — AB (ref 39.0–52.0)
HEMOGLOBIN: 12.4 g/dL — AB (ref 13.0–17.0)
LYMPHS PCT: 65 %
Lymphs Abs: 10 10*3/uL — ABNORMAL HIGH (ref 0.7–4.0)
MCH: 33.3 pg (ref 26.0–34.0)
MCHC: 32.6 g/dL (ref 30.0–36.0)
MCV: 102.2 fL — AB (ref 78.0–100.0)
MYELOCYTES: 0 %
Metamyelocytes Relative: 0 %
Monocytes Absolute: 1.4 10*3/uL — ABNORMAL HIGH (ref 0.1–1.0)
Monocytes Relative: 9 %
NEUTROS ABS: 3.1 10*3/uL (ref 1.7–7.7)
NEUTROS PCT: 20 %
NRBC: 0 /100{WBCs}
PROMYELOCYTES ABS: 0 %
Platelets: 201 10*3/uL (ref 150–400)
RBC: 3.72 MIL/uL — ABNORMAL LOW (ref 4.22–5.81)
RDW: 14.9 % (ref 11.5–15.5)
WBC: 15.4 10*3/uL — ABNORMAL HIGH (ref 4.0–10.5)

## 2015-07-19 LAB — VITAMIN B12: VITAMIN B 12: 451 pg/mL (ref 180–914)

## 2015-07-19 MED ORDER — SODIUM POLYSTYRENE SULFONATE 15 GM/60ML PO SUSP: 30.0000 g | Freq: Once | ORAL | Status: DC

## 2015-07-19 MED ORDER — SODIUM CHLORIDE 0.9% FLUSH
10.0000 mL | Freq: Once | INTRAVENOUS | Status: AC
  Administered 2015-07-19: 10 mL via INTRAVENOUS

## 2015-07-19 MED ORDER — HEPARIN SOD (PORK) LOCK FLUSH 100 UNIT/ML IV SOLN
INTRAVENOUS | Status: AC
  Filled 2015-07-19: qty 5

## 2015-07-19 MED ORDER — CYANOCOBALAMIN 1000 MCG/ML IJ SOLN
INTRAMUSCULAR | Status: AC
  Filled 2015-07-19: qty 1

## 2015-07-19 MED ORDER — CYANOCOBALAMIN 1000 MCG/ML IJ SOLN
1000.0000 ug | Freq: Once | INTRAMUSCULAR | Status: AC
  Administered 2015-07-19: 1000 ug via INTRAMUSCULAR

## 2015-07-19 MED ORDER — HEPARIN SOD (PORK) LOCK FLUSH 100 UNIT/ML IV SOLN
500.0000 [IU] | Freq: Once | INTRAVENOUS | Status: AC
  Administered 2015-07-19: 500 [IU] via INTRAVENOUS

## 2015-07-19 NOTE — Patient Instructions (Signed)
[place] at [facility] Discharge Instructions  RECOMMENDATIONS MADE BY THE CONSULTANT AND ANY TEST RESULTS WILL BE SENT TO YOUR REFERRING PHYSICIAN.  Vitamin B12 1000 mcg injection given as ordered. Port flush done as scheduled.  Thank you for choosing [place] at [facility] to provide your oncology and hematology care.  To afford each patient quality time with our provider, please arrive at least 15 minutes before your scheduled appointment time.   Beginning January 23rd 2017 lab work for the [facility] will be done in the  Main lab at [facility] on 1st floor. If you have a lab appointment with the [place] please come in thru the  Main Entrance and check in at the main information desk  You need to re-schedule your appointment should you arrive 10 or more minutes late.  We strive to give you quality time with our providers, and arriving late affects you and other patients whose appointments are after yours.  Also, if you no show three or more times for appointments you may be dismissed from the clinic at the providers discretion.     Again, thank you for choosing [facility].  Our hope is that these requests will decrease the amount of time that you wait before being seen by our physicians.       _____________________________________________________________  Should you have questions after your visit to [facility] please contact our office at [phone] between the hours of 8:30 a.m. and 4:30 p.m.  Voicemails left after 4:30 p.m. will not be returned until the following business day.  For prescription refill requests, have your pharmacy contact our office.         Resources For Cancer Patients and their Caregivers ? American Cancer Society: Can assist with transportation, wigs, general needs, runs Look Good Feel Better.        301-437-4813 ? Cancer Care: Provides financial assistance, online  support groups, medication/co-pay assistance.  1-800-813-HOPE (402)130-6297) ? [place] Assists [place] cancer patients and their families through emotional , educational and financial support.  ([phone] ? [place] DSS Where to apply for food stamps, Medicaid and utility assistance. [phone] ? RCATS: Transportation to medical appointments. 425-197-8699 ? Social Security Administration: May apply for disability if have a Stage IV cancer. [phone] ? [employer], Disability and Transit Services: Assists with nutrition, care and transit needs. [phone]

## 2015-07-19 NOTE — Progress Notes (Signed)
Randall Reyes presents today for injection per MD orders. B12 1000 mcg administered IM in right Upper Arm. Administration without incident. Patient tolerated well.  Randall Reyes presented for Portacath access and flush. Proper placement of portacath confirmed by CXR. Portacath located right chest wall accessed with  H 20 needle. No blood return. Flushes easily without pain or discomfort. Portacath flushed with 83ml NS and 500U/55ml Heparin and needle removed intact. Procedure without incident. Patient tolerated procedure well.

## 2015-08-16 ENCOUNTER — Encounter (HOSPITAL_COMMUNITY): Payer: Medicare Other | Attending: Oncology | Admitting: Hematology & Oncology

## 2015-08-16 ENCOUNTER — Encounter (HOSPITAL_COMMUNITY): Payer: Medicare Other

## 2015-08-16 ENCOUNTER — Encounter (HOSPITAL_COMMUNITY): Payer: Self-pay | Admitting: Hematology & Oncology

## 2015-08-16 ENCOUNTER — Encounter (HOSPITAL_BASED_OUTPATIENT_CLINIC_OR_DEPARTMENT_OTHER): Payer: Medicare Other

## 2015-08-16 VITALS — BP 123/72 | HR 65 | Temp 97.8°F | Resp 18 | Wt 139.9 lb

## 2015-08-16 DIAGNOSIS — C9110 Chronic lymphocytic leukemia of B-cell type not having achieved remission: Secondary | ICD-10-CM | POA: Diagnosis not present

## 2015-08-16 DIAGNOSIS — M310 Hypersensitivity angiitis: Secondary | ICD-10-CM | POA: Insufficient documentation

## 2015-08-16 DIAGNOSIS — E538 Deficiency of other specified B group vitamins: Secondary | ICD-10-CM

## 2015-08-16 DIAGNOSIS — D519 Vitamin B12 deficiency anemia, unspecified: Secondary | ICD-10-CM

## 2015-08-16 LAB — COMPREHENSIVE METABOLIC PANEL
ALK PHOS: 76 U/L (ref 38–126)
ALT: 14 U/L — AB (ref 17–63)
AST: 18 U/L (ref 15–41)
Albumin: 3.7 g/dL (ref 3.5–5.0)
Anion gap: 7 (ref 5–15)
BUN: 23 mg/dL — AB (ref 6–20)
CALCIUM: 9 mg/dL (ref 8.9–10.3)
CHLORIDE: 105 mmol/L (ref 101–111)
CO2: 26 mmol/L (ref 22–32)
CREATININE: 2.04 mg/dL — AB (ref 0.61–1.24)
GFR, EST AFRICAN AMERICAN: 34 mL/min — AB (ref >60)
GFR, EST NON AFRICAN AMERICAN: 30 mL/min — AB (ref >60)
Glucose, Bld: 113 mg/dL — ABNORMAL HIGH (ref 65–99)
Potassium: 4.6 mmol/L (ref 3.5–5.1)
SODIUM: 138 mmol/L (ref 135–145)
Total Bilirubin: 0.3 mg/dL (ref 0.3–1.2)
Total Protein: 8.3 g/dL — ABNORMAL HIGH (ref 6.5–8.1)

## 2015-08-16 LAB — CBC WITH DIFFERENTIAL/PLATELET
BAND NEUTROPHILS: 0 %
Basophils Absolute: 0 10*3/uL (ref 0.0–0.1)
Basophils Relative: 0 %
Blasts: 0 %
EOS ABS: 0 10*3/uL (ref 0.0–0.7)
EOS PCT: 0 %
HCT: 36.9 % — ABNORMAL LOW (ref 39.0–52.0)
HEMOGLOBIN: 12.4 g/dL — AB (ref 13.0–17.0)
LYMPHS ABS: 16.5 10*3/uL — AB (ref 0.7–4.0)
Lymphocytes Relative: 93 %
MCH: 34 pg (ref 26.0–34.0)
MCHC: 33.6 g/dL (ref 30.0–36.0)
MCV: 101.1 fL — AB (ref 78.0–100.0)
METAMYELOCYTES PCT: 0 %
MONO ABS: 0 10*3/uL — AB (ref 0.1–1.0)
MYELOCYTES: 0 %
Monocytes Relative: 0 %
NRBC: 0 /100{WBCs}
Neutro Abs: 1.2 10*3/uL — ABNORMAL LOW (ref 1.7–7.7)
Neutrophils Relative %: 7 %
PLATELETS: 186 10*3/uL (ref 150–400)
PROMYELOCYTES ABS: 0 %
RBC: 3.65 MIL/uL — ABNORMAL LOW (ref 4.22–5.81)
RDW: 14.6 % (ref 11.5–15.5)
WBC: 17.7 10*3/uL — AB (ref 4.0–10.5)

## 2015-08-16 LAB — LACTATE DEHYDROGENASE: LDH: 146 U/L (ref 98–192)

## 2015-08-16 LAB — SEDIMENTATION RATE: Sed Rate: 83 mm/hr — ABNORMAL HIGH (ref 0–16)

## 2015-08-16 LAB — C-REACTIVE PROTEIN: CRP: 0.6 mg/dL (ref <1.0)

## 2015-08-16 MED ORDER — CYANOCOBALAMIN 1000 MCG/ML IJ SOLN
1000.0000 ug | Freq: Once | INTRAMUSCULAR | Status: AC
  Administered 2015-08-16: 1000 ug via INTRAMUSCULAR
  Filled 2015-08-16: qty 1

## 2015-08-16 NOTE — Progress Notes (Signed)
Randall Gravel, MD Randall Reyes [id number]    CLL (chronic lymphocytic leukemia) [facility])   02/14/2005 Procedure Peripheral flow cytometry- CLL (without 11q or 17 p deletion) presenting with stage 0 disease   10/25/2008 Progression Increasing WBC and symptoms suggestive of progressive disease   11/09/2008 - 01/07/2009 Chemotherapy Rituxan, fludarabine, cytoxan x 3 cycles with the discontinuation of fludarabine and cytoxan due to severe pancytopenia requiring hospitalization.   01/10/2009 Adverse Reaction Severe pancytopenia and adverse side effects   02/01/2009 - 04/01/2009 Chemotherapy Rituxan every 21 days x 3 cycles   04/02/2009 Remission Stable disease.  Chemotherapy holiday started   06/22/2013 - 06/29/2013 Chemotherapy Imbruvica daily   06/29/2013 Adverse Reaction Large right posterior leg (popliteal) ecchymosis/violaceous rash.  Likely Imbruvica-induced   07/31/2013 - 10/12/2013 Chemotherapy Zydelig (idelalisib) 150 mg BID monotherapy   10/12/2013 Adverse Reaction Transaminitis, suspected to be secondary to Randall Reyes.  Dose decreased.   10/12/2013 - 11/09/2013 Chemotherapy Zydelig (idelalisib) 150 mg daily.   11/09/2013 Adverse Reaction Worsening transaminitis.  Zydelig on HOLD   11/09/2013 Treatment Plan Change Hold Zydelig   12/28/2013 Adverse Reaction Herpes Zoster outbreak.  Partial response with Famvir x 1 week, switched to acyclovir.  Progression over 1-2 weeks with development of keratoconjunctivitis bilaterally.  VZ Immune Globulin 625 mg IM given on 01/13/14.   02/08/2014 Treatment Plan Change Ibrutinib restarted 140 mg daily for one week followed by 280 mg daily followed by 420 mg daily with prophylactic acyclovir.   02/08/2014 - 07/05/2014 Chemotherapy Ibrutinib 420 mg daily   [id number] - 04/17/2014 Hospital Admission Right lower lobe pneumonia   07/05/2014 Adverse Reaction Fissuring of fingertips and sore, red feet with transaminitis   07/05/2014 Treatment Plan Change Hold Ibrutinib    07/22/2014 - 08/19/2014 Chemotherapy Ibrutinib restarted at 280 mg daily.   08/09/2014 - 08/12/2014 Hospital Admission Bilateral pneumonia   08/19/2014 Adverse Reaction Nausea with vomiting.  Escorted to ED.   08/19/2014 Treatment Plan Change Ibrutinib on hold- ED escort from clinic    10/18/2014 Miscellaneous B12 weekly x 4 then monthly.    CURRENT THERAPY: Observation  INTERVAL HISTORY: Randall Reyes 80 y.o. male returns for followup of CLL.   Randall Reyes was here with his wife today.  His wife said that her one complaint for him is that he needs to walk. She says that he "doesn't do nothing" and that it is days before he does anything. She says that the only movement he gets is getting up and down off the recliner. He said that he mowed the lawn the other day. He says he walks a lot in [place].   He has been planting tomatoes, and beans in his garden recently.  His wife said that he drinks a lot of water throughout the day. Averages 12 oz of water 5 or 6 times a day.  His wife said that his blood sugar has been good as well. He saw Dr. Maudie Mercury yesterday and he was "well pleased" with everything.  He has an upcoming appointment with Dr. Theador Hawthorne.  Past Medical History  Diagnosis Date  . Diabetes mellitus   . Leukemia ([place]) 6.22.2012    PER PATIENT  . Hypertension   . Ulcer   . Anemia   . CLL (chronic lymphocytic leukemia) ([place]) 01/24/2011  . Melanoma in situ ([place]) 01/24/2011  . DM (diabetes mellitus) ([place]) 01/24/2011  . Mild obesity 01/24/2011  . Eczema 01/24/2011  . Port catheter in place  05/02/2012  . Shingles   . History of pneumonia 04/2014  . B12 deficiency 12/07/2014    has CLL (chronic lymphocytic leukemia) ([place]); Melanoma in situ ([place]); DM (diabetes mellitus) ([place]); Mild obesity; Eczema; Port catheter in place; Leukocytosis; AKI (acute kidney injury) ([place]); Hyperkalemia; Malnutrition of moderate degree ([place]); B12 deficiency; and HTN (hypertension) on his problem list.      is allergic to aspirin and ibuprofen.  We administered cyanocobalamin.  Past Surgical History  Procedure Laterality Date  . Hernia repair  2001  . Bleeding ulcer    . Portacath placement  2008  . Cataract extraction w/phaco  05/05/2012    Procedure: CATARACT EXTRACTION PHACO AND INTRAOCULAR LENS PLACEMENT (IOC);  Surgeon: Tonny Branch, MD;  Location: [facility];  Service: Ophthalmology;  Laterality: Right;  CDE:13.25  . Cataract extraction w/phaco  05/15/2012    Procedure: CATARACT EXTRACTION PHACO AND INTRAOCULAR LENS PLACEMENT (IOC);  Surgeon: Tonny Branch, MD;  Location: [facility];  Service: Ophthalmology;  Laterality: Left;  CDE:  12.32    Denies any headaches, dizziness, double vision, fevers, chills, night sweats, nausea, vomiting, diarrhea, constipation, chest pain, heart palpitations, blood in stool, black tarry stool, urinary pain, urinary burning, urinary frequency, hematuria.  14 point review of systems was performed and is negative except as detailed under history of present illness and above  PHYSICAL EXAMINATION  ECOG PERFORMANCE STATUS: 1 - Symptomatic but completely ambulatory  Filed Vitals:   08/16/15 1223  BP: 123/72  Pulse: 65  Temp: 97.8 F (36.6 C)  Resp: 18    GENERAL:   SKIN: skin color, texture, turgor are normal, no rashes or significant lesions HEAD: Normocephalic, No masses, lesions, tenderness or abnormalities EYES: normal, PERRLA, EOMI, Conjunctiva are pink and non-injected EARS: External ears normal OROPHARYNX:lips, buccal mucosa, and tongue normal and mucous membranes are moist  NECK: supple, trachea midline LYMPH:  No appreciable palpable adenopathy in the neck, supraclavicular regions, axillary area or groin BREAST:not examined LUNGS: clear to auscultation  HEART: regular rate & rhythm, no murmurs, no gallops, S1 normal and S2 normal ABDOMEN:abdomen soft and normal bowel sounds BACK: Back symmetric, no curvature. NEURO: alert & oriented x 3 with fluent  speech, no focal motor/sensory deficits, gait normal, onto the exam table without any difficulty   LABORATORY DATA: I have reviewed the data as listed.   CBC    Component Value Date/Time   WBC 17.7* 08/16/2015 1200   RBC 3.65* 08/16/2015 1200   RBC 2.82* 05/21/2014 1422   HGB 12.4* 08/16/2015 1200   HCT 36.9* 08/16/2015 1200   PLT 186 08/16/2015 1200   MCV 101.1* 08/16/2015 1200   MCH 34.0 08/16/2015 1200   MCHC 33.6 08/16/2015 1200   RDW 14.6 08/16/2015 1200   LYMPHSABS 16.5* 08/16/2015 1200   MONOABS 0.0* 08/16/2015 1200   EOSABS 0.0 08/16/2015 1200   BASOSABS 0.0 08/16/2015 1200      Chemistry      Component Value Date/Time   NA 138 08/16/2015 1200   K 4.6 08/16/2015 1200   CL 105 08/16/2015 1200   CO2 26 08/16/2015 1200   BUN 23* 08/16/2015 1200   CREATININE 2.04* 08/16/2015 1200      Component Value Date/Time   CALCIUM 9.0 08/16/2015 1200   CALCIUM 8.3* 02/03/2015 0639   ALKPHOS 76 08/16/2015 1200   AST 18 08/16/2015 1200   ALT 14* 08/16/2015 1200   BILITOT 0.3 08/16/2015 1200      ASSESSMENT AND PLAN:   CLL  B12 deficiency  CLL (chronic lymphocytic leukemia) ([place]) He was diagnosed in 2006 and per records was treated with FCR in 2010 secondary to a rapid doubling time of his wbc count up to 290K with diffuse LAD, hypogammaglobulinemia. He only received 3 cycles of FCR secondary to prolonged pancytopenia.   He was started on Imbruvica in January of 2015 for a rising wbc. Other counts were relatively preserved. WBC count at that time was 36K. He was otherwise asymptomatic. Therapy was interrupted secondary to rash. He was then started on Idelalisib on 07/31/2013. This therapy was held secondary to zoster diagnosed on 12/28/2013. Patient was given Varicella Zoster Immune Globulin IM on 01/13/2014.  He was then restarted on Imbruvica on 02/08/2014. He was dose reduced secondary to abnormal LFT's and therapy was discontinued secondary to pneumonia and generalized  decline.  He is currently doing fairly well. He is encouraged to continue to follow with nephrology.  I encouraged physical activity. We will continue with observation. He will return in 3 months with repeat labs.   B12 deficiency He will continue with ongoing B12 replacement.    All questions were answered. The patient knows to call the clinic with any problems, questions or concerns. We can certainly see the patient much sooner if necessary.   This document serves as a record of services personally performed by Ancil Linsey, MD. It was created on her behalf by Kandace Blitz, a trained medical scribe. The creation of this record is based on the scribe's personal observations and the provider's statements to them. This document has been checked and approved by the attending provider.  I have reviewed the above documentation for accuracy and completeness, and I agree with the above.  This note was electronically signed.  Kelby Fam. [name], MD

## 2015-08-16 NOTE — Assessment & Plan Note (Signed)
He will continue with ongoing B12 replacement.

## 2015-08-16 NOTE — Patient Instructions (Addendum)
[place] at [facility] Discharge Instructions  RECOMMENDATIONS MADE BY THE CONSULTANT AND ANY TEST RESULTS WILL BE SENT TO YOUR REFERRING PHYSICIAN.  Exam done and seen today by Dr.Penland  Follow up with Doctor in 3 months and labs in 3 months Call for any concerns or questions  Vitamin B12 injections monthly  Thank you for choosing [place] at [facility] to provide your oncology and hematology care.  To afford each patient quality time with our provider, please arrive at least 15 minutes before your scheduled appointment time.   Beginning January 23rd 2017 lab work for the [facility] will be done in the  Main lab at [facility] on 1st floor. If you have a lab appointment with the [place] please come in thru the  Main Entrance and check in at the main information desk  You need to re-schedule your appointment should you arrive 10 or more minutes late.  We strive to give you quality time with our providers, and arriving late affects you and other patients whose appointments are after yours.  Also, if you no show three or more times for appointments you may be dismissed from the clinic at the providers discretion.     Again, thank you for choosing [facility].  Our hope is that these requests will decrease the amount of time that you wait before being seen by our physicians.       _____________________________________________________________  Should you have questions after your visit to [facility], please contact our office at [phone] between the hours of 8:30 a.m. and 4:30 p.m.  Voicemails left after 4:30 p.m. will not be returned until the following business day.  For prescription refill requests, have your pharmacy contact our office.         Resources For Cancer Patients and their Caregivers ? American Cancer Society: Can assist with transportation, wigs, general needs, runs Look Good  Feel Better.        [phone] ? Cancer Care: Provides financial assistance, online support groups, medication/co-pay assistance.  1-800-813-HOPE (330)310-3733) ? [place] Assists [place] cancer patients and their families through emotional , educational and financial support.  [phone] ? [place] DSS Where to apply for food stamps, Medicaid and utility assistance. [phone] ? RCATS: Transportation to medical appointments. [phone] ? Social Security Administration: May apply for disability if have a Stage IV cancer. [phone] ? [employer], Disability and Transit Services: Assists with nutrition, care and transit needs. [phone]

## 2015-08-16 NOTE — Assessment & Plan Note (Signed)
He was diagnosed in 2006 and per records was treated with FCR in 2010 secondary to a rapid doubling time of his wbc count up to 290K with diffuse LAD, hypogammaglobulinemia. He only received 3 cycles of FCR secondary to prolonged pancytopenia.   He was started on Imbruvica in January of 2015 for a rising wbc. Other counts were relatively preserved. WBC count at that time was 36K. He was otherwise asymptomatic. Therapy was interrupted secondary to rash. He was then started on Idelalisib on 07/31/2013. This therapy was held secondary to zoster diagnosed on 12/28/2013. Patient was given Varicella Zoster Immune Globulin IM on 01/13/2014.  He was then restarted on Imbruvica on 02/08/2014. He was dose reduced secondary to abnormal LFT's and therapy was discontinued secondary to pneumonia and generalized decline.  He is currently doing fairly well. He is encouraged to continue to follow with nephrology.  I encouraged physical activity. We will continue with observation. He will return in 3 months with repeat labs.

## 2015-09-13 ENCOUNTER — Encounter (HOSPITAL_COMMUNITY): Payer: Medicare Other | Attending: Oncology

## 2015-09-13 ENCOUNTER — Other Ambulatory Visit (HOSPITAL_COMMUNITY)
Admission: RE | Admit: 2015-09-13 | Discharge: 2015-09-13 | Disposition: A | Discharge status: Home / Self Care | Payer: Medicare Other | Source: Ambulatory Visit | Attending: Nephrology | Admitting: Nephrology

## 2015-09-13 ENCOUNTER — Encounter (HOSPITAL_COMMUNITY): Payer: Medicare Other

## 2015-09-13 VITALS — BP 141/66 | HR 60 | Temp 98.0°F | Resp 16

## 2015-09-13 DIAGNOSIS — N184 Chronic kidney disease, stage 4 (severe): Secondary | ICD-10-CM | POA: Insufficient documentation

## 2015-09-13 DIAGNOSIS — E538 Deficiency of other specified B group vitamins: Secondary | ICD-10-CM

## 2015-09-13 DIAGNOSIS — Z79899 Other long term (current) drug therapy: Secondary | ICD-10-CM | POA: Insufficient documentation

## 2015-09-13 DIAGNOSIS — D509 Iron deficiency anemia, unspecified: Secondary | ICD-10-CM | POA: Insufficient documentation

## 2015-09-13 DIAGNOSIS — E559 Vitamin D deficiency, unspecified: Secondary | ICD-10-CM | POA: Diagnosis present

## 2015-09-13 DIAGNOSIS — M310 Hypersensitivity angiitis: Secondary | ICD-10-CM | POA: Insufficient documentation

## 2015-09-13 DIAGNOSIS — D519 Vitamin B12 deficiency anemia, unspecified: Secondary | ICD-10-CM

## 2015-09-13 DIAGNOSIS — C9110 Chronic lymphocytic leukemia of B-cell type not having achieved remission: Secondary | ICD-10-CM | POA: Diagnosis present

## 2015-09-13 DIAGNOSIS — R809 Proteinuria, unspecified: Secondary | ICD-10-CM | POA: Insufficient documentation

## 2015-09-13 LAB — CBC WITH DIFFERENTIAL/PLATELET
Band Neutrophils: 0 %
Basophils Absolute: 0 10*3/uL (ref 0.0–0.1)
Basophils Relative: 0 %
Blasts: 0 %
EOS PCT: 5 %
Eosinophils Absolute: 1.4 10*3/uL — ABNORMAL HIGH (ref 0.0–0.7)
HEMATOCRIT: 37.1 % — AB (ref 39.0–52.0)
Hemoglobin: 12.4 g/dL — ABNORMAL LOW (ref 13.0–17.0)
Lymphocytes Relative: 74 %
Lymphs Abs: 19.9 10*3/uL — ABNORMAL HIGH (ref 0.7–4.0)
MCH: 34 pg (ref 26.0–34.0)
MCHC: 33.4 g/dL (ref 30.0–36.0)
MCV: 101.6 fL — AB (ref 78.0–100.0)
MONO ABS: 1.1 10*3/uL — AB (ref 0.1–1.0)
MYELOCYTES: 0 %
Metamyelocytes Relative: 0 %
Monocytes Relative: 4 %
NEUTROS PCT: 17 %
NRBC: 0 /100{WBCs}
Neutro Abs: 4.6 10*3/uL (ref 1.7–7.7)
Platelets: 207 10*3/uL (ref 150–400)
Promyelocytes Absolute: 0 %
RBC: 3.65 MIL/uL — AB (ref 4.22–5.81)
RDW: 14.5 % (ref 11.5–15.5)
WBC: 27 10*3/uL — AB (ref 4.0–10.5)

## 2015-09-13 LAB — HEMOGLOBIN AND HEMATOCRIT, BLOOD
HEMATOCRIT: 37 % — AB (ref 39.0–52.0)
Hemoglobin: 12.2 g/dL — ABNORMAL LOW (ref 13.0–17.0)

## 2015-09-13 LAB — RENAL FUNCTION PANEL
ANION GAP: 7 (ref 5–15)
Albumin: 3.8 g/dL (ref 3.5–5.0)
BUN: 23 mg/dL — ABNORMAL HIGH (ref 6–20)
CHLORIDE: 105 mmol/L (ref 101–111)
CO2: 28 mmol/L (ref 22–32)
CREATININE: 1.86 mg/dL — AB (ref 0.61–1.24)
Calcium: 8.7 mg/dL — ABNORMAL LOW (ref 8.9–10.3)
GFR, EST AFRICAN AMERICAN: 38 mL/min — AB (ref >60)
GFR, EST NON AFRICAN AMERICAN: 33 mL/min — AB (ref >60)
Glucose, Bld: 96 mg/dL (ref 65–99)
POTASSIUM: 4.3 mmol/L (ref 3.5–5.1)
Phosphorus: 3.9 mg/dL (ref 2.5–4.6)
Sodium: 140 mmol/L (ref 135–145)

## 2015-09-13 LAB — COMPREHENSIVE METABOLIC PANEL
ALT: 15 U/L — AB (ref 17–63)
AST: 22 U/L (ref 15–41)
Albumin: 3.9 g/dL (ref 3.5–5.0)
Alkaline Phosphatase: 75 U/L (ref 38–126)
Anion gap: 5 (ref 5–15)
BILIRUBIN TOTAL: 0.3 mg/dL (ref 0.3–1.2)
BUN: 23 mg/dL — AB (ref 6–20)
CALCIUM: 8.5 mg/dL — AB (ref 8.9–10.3)
CO2: 29 mmol/L (ref 22–32)
CREATININE: 1.78 mg/dL — AB (ref 0.61–1.24)
Chloride: 102 mmol/L (ref 101–111)
GFR calc Af Amer: 40 mL/min — ABNORMAL LOW (ref >60)
GFR, EST NON AFRICAN AMERICAN: 35 mL/min — AB (ref >60)
Glucose, Bld: 98 mg/dL (ref 65–99)
Potassium: 4.4 mmol/L (ref 3.5–5.1)
Sodium: 136 mmol/L (ref 135–145)
TOTAL PROTEIN: 8.3 g/dL — AB (ref 6.5–8.1)

## 2015-09-13 LAB — PROTEIN / CREATININE RATIO, URINE
CREATININE, URINE: 169.38 mg/dL
Protein Creatinine Ratio: 0.44 mg/mg{Cre} — ABNORMAL HIGH (ref 0.00–0.15)
Total Protein, Urine: 74 mg/dL

## 2015-09-13 MED ORDER — SODIUM CHLORIDE 0.9% FLUSH
20.0000 mL | INTRAVENOUS | Status: DC | PRN
  Administered 2015-09-13: 20 mL via INTRAVENOUS
  Filled 2015-09-13: qty 20

## 2015-09-13 MED ORDER — HEPARIN SOD (PORK) LOCK FLUSH 100 UNIT/ML IV SOLN
500.0000 [IU] | Freq: Once | INTRAVENOUS | Status: AC
  Administered 2015-09-13: 500 [IU] via INTRAVENOUS
  Filled 2015-09-13: qty 5

## 2015-09-13 MED ORDER — CYANOCOBALAMIN 1000 MCG/ML IJ SOLN
1000.0000 ug | Freq: Once | INTRAMUSCULAR | Status: AC
  Administered 2015-09-13: 1000 ug via INTRAMUSCULAR

## 2015-09-13 MED ORDER — CYANOCOBALAMIN 1000 MCG/ML IJ SOLN
INTRAMUSCULAR | Status: AC
  Filled 2015-09-13: qty 1

## 2015-09-13 NOTE — Progress Notes (Signed)
Randall Reyes presented for Portacath access and flush. Proper placement of portacath confirmed by CXR. Portacath located right chest wall accessed with  H 20 needle. No blood return, but flushes well and patient has no discomfort.   Portacath flushed with 6ml NS and 500U/11ml Heparin and needle removed intact. Procedure without incident. Patient tolerated procedure well.  Randall Reyes presents today for injection per MD orders. B12 1044mcg administered IM in right Upper Arm. Administration without incident. Patient tolerated well.  Lab work discussed with Robynn Pane, PA. Patient had CBC and CMET ordered but had already been to the lab just prior to coming upstairs to [place], so PA approved that the labs drawn for [place] today were adequate so the patient didn't need another venipuncture.

## 2015-09-13 NOTE — Progress Notes (Signed)
Please see other encounter for documentation.   

## 2015-09-13 NOTE — Patient Instructions (Signed)
[place] at [facility] Discharge Instructions  RECOMMENDATIONS MADE BY THE CONSULTANT AND ANY TEST RESULTS WILL BE SENT TO YOUR REFERRING PHYSICIAN.  B12 injection and port flush today.    Thank you for choosing [place] at [facility] to provide your oncology and hematology care.  To afford each patient quality time with our provider, please arrive at least 15 minutes before your scheduled appointment time.   Beginning January 23rd 2017 lab work for the [facility] will be done in the  Main lab at [facility] on 1st floor. If you have a lab appointment with the [place] please come in thru the  Main Entrance and check in at the main information desk  You need to re-schedule your appointment should you arrive 10 or more minutes late.  We strive to give you quality time with our providers, and arriving late affects you and other patients whose appointments are after yours.  Also, if you no show three or more times for appointments you may be dismissed from the clinic at the providers discretion.     Again, thank you for choosing [facility].  Our hope is that these requests will decrease the amount of time that you wait before being seen by our physicians.       _____________________________________________________________  Should you have questions after your visit to [facility], please contact our office at [phone] between the hours of 8:30 a.m. and 4:30 p.m.  Voicemails left after 4:30 p.m. will not be returned until the following business day.  For prescription refill requests, have your pharmacy contact our office.         Resources For Cancer Patients and their Caregivers ? American Cancer Society: Can assist with transportation, wigs, general needs, runs Look Good Feel Better.        [phone] ? Cancer Care: Provides financial assistance, online support groups, medication/co-pay  assistance.  1-800-813-HOPE 6461256375) ? [place] Assists [place] cancer patients and their families through emotional , educational and financial support.  [phone] ? [place] DSS Where to apply for food stamps, Medicaid and utility assistance. 347-152-1852 ? RCATS: Transportation to medical appointments. 854-747-6491 ? Social Security Administration: May apply for disability if have a Stage IV cancer. [phone] ? [employer], Disability and Transit Services: Assists with nutrition, care and transit needs. [phone]

## 2015-09-14 LAB — PARATHYROID HORMONE, INTACT (NO CA): PTH: 39 pg/mL (ref 15–65)

## 2015-09-14 LAB — VITAMIN D 25 HYDROXY (VIT D DEFICIENCY, FRACTURES): VIT D 25 HYDROXY: 36.3 ng/mL (ref 30.0–100.0)

## 2015-09-15 ENCOUNTER — Ambulatory Visit (HOSPITAL_COMMUNITY): Payer: Medicare Other

## 2015-09-15 LAB — IRON AND TIBC
IRON: 82 ug/dL (ref 45–182)
Saturation Ratios: 28 % (ref 17.9–39.5)
TIBC: 297 ug/dL (ref 250–450)
UIBC: 215 ug/dL

## 2015-09-15 LAB — FERRITIN: FERRITIN: 62 ng/mL (ref 24–336)

## 2015-10-17 ENCOUNTER — Encounter (HOSPITAL_COMMUNITY): Payer: Medicare Other | Attending: Oncology

## 2015-10-17 VITALS — BP 115/70 | HR 64 | Temp 98.1°F | Resp 20

## 2015-10-17 DIAGNOSIS — E538 Deficiency of other specified B group vitamins: Secondary | ICD-10-CM

## 2015-10-17 DIAGNOSIS — M310 Hypersensitivity angiitis: Secondary | ICD-10-CM | POA: Insufficient documentation

## 2015-10-17 DIAGNOSIS — C9110 Chronic lymphocytic leukemia of B-cell type not having achieved remission: Secondary | ICD-10-CM | POA: Insufficient documentation

## 2015-10-17 MED ORDER — CYANOCOBALAMIN 1000 MCG/ML IJ SOLN
1000.0000 ug | Freq: Once | INTRAMUSCULAR | Status: AC
Start: 2015-10-17 — End: 2015-10-17
  Administered 2015-10-17: 1000 ug via INTRAMUSCULAR
  Filled 2015-10-17: qty 1

## 2015-10-17 NOTE — Progress Notes (Signed)
Randall Reyes presents today for injection per MD orders. B12 1038mcg administered IM in right Upper Arm. Administration without incident. Patient tolerated well.

## 2015-11-15 NOTE — Assessment & Plan Note (Addendum)
CLL, diagnosed in 2006 and per records was treated with FCR in 2010 secondary to a rapid doubling time of his wbc count up to 290K with diffuse LAD, hypogammaglobulinemia. He only received 3 cycles of FCR secondary to prolonged pancytopenia.  In Jan 2015, he was started on Imbruvica for a rising wbc and increased fatigue; other counts were relatively preserved. WBC count at that time was 36K. Therapy was interrupted secondary to rash. He was then started on Idelalisib on 07/31/2013. This therapy was held secondary to severe zoster outbreak diagnosed on 12/28/2013. Patient was given Varicella Zoster Immune Globulin IM on 01/13/2014. He was then restarted on Imbruvica on 02/08/2014. He was dose reduced secondary to abnormal LFT's and now therapy has been discontinued secondary to pneumonia and generalized decline. He is currently doing better off therapy.  Labs today: CBC diff, CMET, LDH.  I personally reviewed and went over laboratory results with the patient.  The results are noted within this dictation.  Labs in 12 weeks: CBC diff, CMET, LDH  He continues to follow with Fran Lowes, MD (Nephrology).  He is taking kayexalate once per week per nephrology.  Hopefully, the patient report, this medication will be discontinued at his next nephrology follow-up appointment.  Return in 12 weeks for follow-up.

## 2015-11-15 NOTE — Progress Notes (Signed)
Randall Gravel, MD [address] [place] [address]  CLL (chronic lymphocytic leukemia) [facility]) - Plan: Comprehensive metabolic panel, Lactate dehydrogenase, CBC with Differential, CBC with Differential, Comprehensive metabolic panel, Lactate dehydrogenase, CBC with Differential, Comprehensive metabolic panel, Lactate dehydrogenase, CBC with Differential, Comprehensive metabolic panel, Lactate dehydrogenase  B12 deficiency - Plan: SCHEDULING COMMUNICATION INJECTION, cyanocobalamin ((VITAMIN B-12)) injection 1,000 mcg  CURRENT THERAPY: Observation  INTERVAL HISTORY: Randall Reyes 80 y.o. male returns for followup of CLL. AND B12 deficiency    CLL (chronic lymphocytic leukemia) ([place])   02/14/2005 Procedure Peripheral flow cytometry- CLL (without 11q or 17 p deletion) presenting with stage 0 disease   10/25/2008 Progression Increasing WBC and symptoms suggestive of progressive disease   11/09/2008 - 01/07/2009 Chemotherapy Rituxan, fludarabine, cytoxan x 3 cycles with the discontinuation of fludarabine and cytoxan due to severe pancytopenia requiring hospitalization.   01/10/2009 Adverse Reaction Severe pancytopenia and adverse side effects   02/01/2009 - 04/01/2009 Chemotherapy Rituxan every 21 days x 3 cycles   04/02/2009 Remission Stable disease.  Chemotherapy holiday started   06/22/2013 - 06/29/2013 Chemotherapy Imbruvica daily   06/29/2013 Adverse Reaction Large right posterior leg (popliteal) ecchymosis/violaceous rash.  Likely Imbruvica-induced   07/31/2013 - 10/12/2013 Chemotherapy Zydelig (idelalisib) 150 mg BID monotherapy   10/12/2013 Adverse Reaction Transaminitis, suspected to be secondary to Chunky.  Dose decreased.   10/12/2013 - 11/09/2013 Chemotherapy Zydelig (idelalisib) 150 mg daily.   11/09/2013 Adverse Reaction Worsening transaminitis.  Zydelig on HOLD   11/09/2013 Treatment Plan Change Hold Zydelig   12/28/2013 Adverse Reaction Herpes Zoster outbreak.   Partial response with Famvir x 1 week, switched to acyclovir.  Progression over 1-2 weeks with development of keratoconjunctivitis bilaterally.  VZ Immune Globulin 625 mg IM given on 01/13/14.   02/08/2014 Treatment Plan Change Ibrutinib restarted 140 mg daily for one week followed by 280 mg daily followed by 420 mg daily with prophylactic acyclovir.   02/08/2014 - 07/05/2014 Chemotherapy Ibrutinib 420 mg daily   112/06/2013 - 04/17/2014 Hospital Admission Right lower lobe pneumonia   07/05/2014 Adverse Reaction Fissuring of fingertips and sore, red feet with transaminitis   07/05/2014 Treatment Plan Change Hold Ibrutinib   07/22/2014 - 08/19/2014 Chemotherapy Ibrutinib restarted at 280 mg daily.   08/09/2014 - 08/12/2014 Hospital Admission Bilateral pneumonia   08/19/2014 Adverse Reaction Nausea with vomiting.  Escorted to ED.   08/19/2014 Treatment Plan Change Ibrutinib on hold- ED escort from clinic    10/18/2014 Miscellaneous B12 weekly x 4 then monthly.    He is doing very well.  He denies any complaints today.  His wife reports that he is gardening and growing vegetables.  She notes that he is active.  Randall Reyes reports that his is doing well.  He denies any B symptoms.  He has a few bug bites, appearing as mosquito bites.  Review of Systems  Constitutional: Negative.  Negative for fever, chills and weight loss.  HENT: Negative.   Eyes: Negative.   Respiratory: Negative.   Cardiovascular: Negative.   Gastrointestinal: Negative.  Negative for nausea and vomiting.  Genitourinary: Negative.   Musculoskeletal: Negative.   Skin: Positive for itching (2 bug bites on forward and 2 on left medial ankle.).  Neurological: Negative.  Negative for weakness.  Endo/Heme/Allergies: Negative.   Psychiatric/Behavioral: Negative.     Past Medical History  Diagnosis Date  . Diabetes mellitus   . Leukemia ([place]) 6.22.2012    PER PATIENT  . Hypertension   .  Ulcer   . Anemia   . CLL (chronic lymphocytic leukemia) ([place])  01/24/2011  . Melanoma in situ ([place]) 01/24/2011  . DM (diabetes mellitus) ([place]) 01/24/2011  . Mild obesity 01/24/2011  . Eczema 01/24/2011  . Port catheter in place 05/02/2012  . Shingles   . History of pneumonia 04/2014  . B12 deficiency 12/07/2014    Past Surgical History  Procedure Laterality Date  . Hernia repair  2001  . Bleeding ulcer    . Portacath placement  2008  . Cataract extraction w/phaco  05/05/2012    Procedure: CATARACT EXTRACTION PHACO AND INTRAOCULAR LENS PLACEMENT (IOC);  Surgeon: Tonny Branch, MD;  Location: [facility];  Service: Ophthalmology;  Laterality: Right;  CDE:13.25  . Cataract extraction w/phaco  05/15/2012    Procedure: CATARACT EXTRACTION PHACO AND INTRAOCULAR LENS PLACEMENT (IOC);  Surgeon: Tonny Branch, MD;  Location: [facility];  Service: Ophthalmology;  Laterality: Left;  CDE:  12.32    Family History  Problem Relation Age of Onset  . Diabetes Father     Social History   Social History  . Marital Status: Married    Spouse Name: N/A  . Number of Children: N/A  . Years of Education: N/A   Social History Main Topics  . Smoking status: Former Research scientist (life sciences)  . Smokeless tobacco: Former Systems developer    Quit date: 04/25/1967  . Alcohol Use: No  . Drug Use: No  . Sexual Activity: Not Asked   Other Topics Concern  . None   Social History Narrative     PHYSICAL EXAMINATION  ECOG PERFORMANCE STATUS: 0 - Asymptomatic  Filed Vitals:   11/16/15 1048  BP: 152/69  Pulse: 60  Temp: 98.1 F (36.7 C)  Resp: 18    GENERAL:alert, healthy, no distress, well nourished, well developed, comfortable, cooperative, smiling and accompanied by his wife SKIN: skin color, texture, turgor are normal, no rashes or significant lesions, a few raised erythematous nodules on forehead with central open area (1 mm) and two small raised erythematous nodule on medial aspect of left ankle with 5 mm scab secondary to self-inflicted trauma for excoriation. HEAD: Normocephalic, No masses, lesions,  tenderness or abnormalities EYES: normal, EOMI, Conjunctiva are pink and non-injected EARS: External ears normal OROPHARYNX:lips, buccal mucosa, and tongue normal and mucous membranes are moist  NECK: supple, no adenopathy, trachea midline LYMPH:  no palpable lymphadenopathy, no hepatosplenomegaly BREAST:not examined LUNGS: clear to auscultation and percussion HEART: regular rate & rhythm, no murmurs, no gallops, S1 normal and S2 normal ABDOMEN:abdomen soft, non-tender, normal bowel sounds and no masses or organomegaly BACK: Back symmetric, no curvature., No CVA tenderness EXTREMITIES:less then 2 second capillary refill, no joint deformities, effusion, or inflammation, no skin discoloration, no cyanosis  NEURO: alert & oriented x 3 with fluent speech, no focal motor/sensory deficits, gait normal  LABORATORY DATA: CBC    Component Value Date/Time   WBC 27.0* 09/13/2015 1410   RBC 3.65* 09/13/2015 1410   RBC 2.82* 05/21/2014 1422   HGB 12.2* 09/13/2015 1410   HGB 12.4* 09/13/2015 1410   HCT 37.0* 09/13/2015 1410   HCT 37.1* 09/13/2015 1410   PLT 207 09/13/2015 1410   MCV 101.6* 09/13/2015 1410   MCH 34.0 09/13/2015 1410   MCHC 33.4 09/13/2015 1410   RDW 14.5 09/13/2015 1410   LYMPHSABS 19.9* 09/13/2015 1410   MONOABS 1.1* 09/13/2015 1410   EOSABS 1.4* 09/13/2015 1410   BASOSABS 0.0 09/13/2015 1410      Chemistry  Component Value Date/Time   NA 140 09/13/2015 1410   NA 136 09/13/2015 1410   K 4.3 09/13/2015 1410   K 4.4 09/13/2015 1410   CL 105 09/13/2015 1410   CL 102 09/13/2015 1410   CO2 28 09/13/2015 1410   CO2 29 09/13/2015 1410   BUN 23* 09/13/2015 1410   BUN 23* 09/13/2015 1410   CREATININE 1.86* 09/13/2015 1410   CREATININE 1.78* 09/13/2015 1410      Component Value Date/Time   CALCIUM 8.7* 09/13/2015 1410   CALCIUM 8.5* 09/13/2015 1410   CALCIUM 8.3* 02/03/2015 0639   ALKPHOS 75 09/13/2015 1410   AST 22 09/13/2015 1410   ALT 15* 09/13/2015 1410    BILITOT 0.3 09/13/2015 1410        PENDING LABS:   RADIOGRAPHIC STUDIES:  No results found.   PATHOLOGY:    ASSESSMENT AND PLAN:  CLL (chronic lymphocytic leukemia) ([place]) CLL, diagnosed in 2006 and per records was treated with FCR in 2010 secondary to a rapid doubling time of his wbc count up to 290K with diffuse LAD, hypogammaglobulinemia. He only received 3 cycles of FCR secondary to prolonged pancytopenia.  In Jan 2015, he was started on Imbruvica for a rising wbc and increased fatigue; other counts were relatively preserved. WBC count at that time was 36K. Therapy was interrupted secondary to rash. He was then started on Idelalisib on 07/31/2013. This therapy was held secondary to severe zoster outbreak diagnosed on 12/28/2013. Patient was given Varicella Zoster Immune Globulin IM on 01/13/2014. He was then restarted on Imbruvica on 02/08/2014. He was dose reduced secondary to abnormal LFT's and now therapy has been discontinued secondary to pneumonia and generalized decline. He is currently doing better off therapy.  Labs today: CBC diff, CMET, LDH.  I personally reviewed and went over laboratory results with the patient.  The results are noted within this dictation.  Labs in 12 weeks: CBC diff, CMET, LDH  He continues to follow with Fran Lowes, MD (Nephrology).  He is taking kayexalate once per week per nephrology.  Hopefully, the patient report, this medication will be discontinued at his next nephrology follow-up appointment.  Return in 12 weeks for follow-up.   B12 deficiency B12 deficiency with negative intrinsic factor antibody and anti-parietal cell antibody on 10/18/2014.  Now on monthly B12 IM replacement.  Oncology Flowsheet 10/17/2015  cyanocobalamin ((VITAMIN B-12)) IM 1,000 mcg   B12 injection today.  Supportive therapy plan reviewed.    ORDERS PLACED FOR THIS ENCOUNTER: Orders Placed This Encounter  Procedures  . CBC with Differential  . Comprehensive  metabolic panel  . Lactate dehydrogenase  . CBC with Differential  . Comprehensive metabolic panel  . Lactate dehydrogenase  . SCHEDULING COMMUNICATION INJECTION    MEDICATIONS PRESCRIBED THIS ENCOUNTER: Meds ordered this encounter  Medications  . cyanocobalamin ((VITAMIN B-12)) injection 1,000 mcg    Sig:     THERAPY PLAN:  Continue observation.  All questions were answered. The patient knows to call the clinic with any problems, questions or concerns. We can certainly see the patient much sooner if necessary.  Patient and plan discussed with Dr. Ancil Linsey and she is in agreement with the aforementioned.   This note is electronically signed by: Doy Mince 11/16/2015 11:26 AM

## 2015-11-15 NOTE — Assessment & Plan Note (Signed)
B12 deficiency with negative intrinsic factor antibody and anti-parietal cell antibody on 10/18/2014.  Now on monthly B12 IM replacement.  Oncology Flowsheet 10/17/2015  cyanocobalamin ((VITAMIN B-12)) IM 1,000 mcg   B12 injection today.  Supportive therapy plan reviewed.

## 2015-11-16 ENCOUNTER — Ambulatory Visit (HOSPITAL_COMMUNITY): Payer: Medicare Other | Admitting: Hematology & Oncology

## 2015-11-16 ENCOUNTER — Encounter (HOSPITAL_BASED_OUTPATIENT_CLINIC_OR_DEPARTMENT_OTHER): Payer: Medicare Other | Admitting: Oncology

## 2015-11-16 ENCOUNTER — Encounter (HOSPITAL_COMMUNITY): Payer: Medicare Other

## 2015-11-16 ENCOUNTER — Encounter (HOSPITAL_BASED_OUTPATIENT_CLINIC_OR_DEPARTMENT_OTHER): Payer: Medicare Other

## 2015-11-16 ENCOUNTER — Encounter (HOSPITAL_COMMUNITY): Payer: Medicare Other | Attending: Oncology

## 2015-11-16 ENCOUNTER — Encounter (HOSPITAL_COMMUNITY): Payer: Self-pay | Admitting: Oncology

## 2015-11-16 VITALS — BP 152/69 | HR 60 | Temp 98.1°F | Resp 18 | Wt 140.6 lb

## 2015-11-16 DIAGNOSIS — E538 Deficiency of other specified B group vitamins: Secondary | ICD-10-CM

## 2015-11-16 DIAGNOSIS — C9110 Chronic lymphocytic leukemia of B-cell type not having achieved remission: Secondary | ICD-10-CM | POA: Diagnosis present

## 2015-11-16 DIAGNOSIS — M310 Hypersensitivity angiitis: Secondary | ICD-10-CM | POA: Diagnosis present

## 2015-11-16 DIAGNOSIS — Z95828 Presence of other vascular implants and grafts: Secondary | ICD-10-CM

## 2015-11-16 LAB — COMPREHENSIVE METABOLIC PANEL
ALK PHOS: 75 U/L (ref 38–126)
ALT: 14 U/L — AB (ref 17–63)
AST: 17 U/L (ref 15–41)
Albumin: 4 g/dL (ref 3.5–5.0)
Anion gap: 6 (ref 5–15)
BILIRUBIN TOTAL: 0.5 mg/dL (ref 0.3–1.2)
BUN: 26 mg/dL — AB (ref 6–20)
CO2: 23 mmol/L (ref 22–32)
CREATININE: 2.03 mg/dL — AB (ref 0.61–1.24)
Calcium: 8.8 mg/dL — ABNORMAL LOW (ref 8.9–10.3)
Chloride: 107 mmol/L (ref 101–111)
GFR calc Af Amer: 34 mL/min — ABNORMAL LOW (ref >60)
GFR calc non Af Amer: 30 mL/min — ABNORMAL LOW (ref >60)
GLUCOSE: 108 mg/dL — AB (ref 65–99)
Potassium: 4.9 mmol/L (ref 3.5–5.1)
Sodium: 136 mmol/L (ref 135–145)
TOTAL PROTEIN: 8.7 g/dL — AB (ref 6.5–8.1)

## 2015-11-16 LAB — CBC WITH DIFFERENTIAL/PLATELET
BAND NEUTROPHILS: 0 %
BASOS ABS: 0 10*3/uL (ref 0.0–0.1)
BLASTS: 0 %
Basophils Relative: 0 %
EOS ABS: 1.7 10*3/uL — AB (ref 0.0–0.7)
Eosinophils Relative: 4 %
HEMATOCRIT: 39 % (ref 39.0–52.0)
HEMOGLOBIN: 13 g/dL (ref 13.0–17.0)
LYMPHS PCT: 86 %
Lymphs Abs: 35.7 10*3/uL — ABNORMAL HIGH (ref 0.7–4.0)
MCH: 34.6 pg — ABNORMAL HIGH (ref 26.0–34.0)
MCHC: 33.3 g/dL (ref 30.0–36.0)
MCV: 103.7 fL — AB (ref 78.0–100.0)
METAMYELOCYTES PCT: 0 %
MYELOCYTES: 0 %
Monocytes Absolute: 0 10*3/uL — ABNORMAL LOW (ref 0.1–1.0)
Monocytes Relative: 0 %
Neutro Abs: 4.2 10*3/uL (ref 1.7–7.7)
Neutrophils Relative %: 10 %
PROMYELOCYTES ABS: 0 %
Platelets: 192 10*3/uL (ref 150–400)
RBC: 3.76 MIL/uL — ABNORMAL LOW (ref 4.22–5.81)
RDW: 15 % (ref 11.5–15.5)
WBC: 41.6 10*3/uL — AB (ref 4.0–10.5)
nRBC: 0 /100 WBC

## 2015-11-16 LAB — LACTATE DEHYDROGENASE: LDH: 166 U/L (ref 98–192)

## 2015-11-16 MED ORDER — CYANOCOBALAMIN 1000 MCG/ML IJ SOLN
INTRAMUSCULAR | Status: AC
  Filled 2015-11-16: qty 1

## 2015-11-16 MED ORDER — HEPARIN SOD (PORK) LOCK FLUSH 100 UNIT/ML IV SOLN
INTRAVENOUS | Status: AC
  Filled 2015-11-16: qty 5

## 2015-11-16 MED ORDER — CYANOCOBALAMIN 1000 MCG/ML IJ SOLN
1000.0000 ug | Freq: Once | INTRAMUSCULAR | Status: AC
  Administered 2015-11-16: 1000 ug via INTRAMUSCULAR

## 2015-11-16 MED ORDER — SODIUM CHLORIDE 0.9% FLUSH
10.0000 mL | INTRAVENOUS | Status: DC | PRN
  Administered 2015-11-16: 10 mL via INTRAVENOUS
  Filled 2015-11-16: qty 10

## 2015-11-16 MED ORDER — HEPARIN SOD (PORK) LOCK FLUSH 100 UNIT/ML IV SOLN
500.0000 [IU] | Freq: Once | INTRAVENOUS | Status: AC
  Administered 2015-11-16: 500 [IU] via INTRAVENOUS

## 2015-11-16 NOTE — Progress Notes (Signed)
Randall Reyes presented for Portacath access and flush. Proper placement of portacath confirmed by CXR. Portacath located in the right chest wall accessed with  H 20 needle. Clean, dry and intact No blood return.  Portacath flushed with 8ml NS and 500U/54ml Heparin per protocol and needle removed intact. Procedure without incident. Patient tolerated procedure well.

## 2015-11-16 NOTE — Patient Instructions (Signed)
[place] at [facility] Discharge Instructions  RECOMMENDATIONS MADE BY THE CONSULTANT AND ANY TEST RESULTS WILL BE SENT TO YOUR REFERRING PHYSICIAN.  You were seen by Kirby Crigler, PA today. Lab work today and again in 3 months. You will receive your Vitamin B12 injection today and continue them monthly. Continue follow-up with Dr Lowanda Foster. Return to clinic in 12 weeks for follow-up appointment. Call clinic with any questions or concerns.   Thank you for choosing [place] at [facility] to provide your oncology and hematology care.  To afford each patient quality time with our provider, please arrive at least 15 minutes before your scheduled appointment time.   Beginning January 23rd 2017 lab work for the [facility] will be done in the  Main lab at [facility] on 1st floor. If you have a lab appointment with the [facility] please come in thru the  Main Entrance and check in at the main information desk  You need to re-schedule your appointment should you arrive 10 or more minutes late.  We strive to give you quality time with our providers, and arriving late affects you and other patients whose appointments are after yours.  Also, if you no show three or more times for appointments you may be dismissed from the clinic at the providers discretion.     Again, thank you for choosing [facility].  Our hope is that these requests will decrease the amount of time that you wait before being seen by our physicians.       _____________________________________________________________  Should you have questions after your visit to [facility], please contact our office at [phone] between the hours of 8:30 a.m. and 4:30 p.m.  Voicemails left after 4:30 p.m. will not be returned until the following business day.  For prescription refill requests, have your pharmacy contact our office.         Resources For Cancer  Patients and their Caregivers ? American Cancer Society: Can assist with transportation, wigs, general needs, runs Look Good Feel Better.        725-687-0386 ? Cancer Care: Provides financial assistance, online support groups, medication/co-pay assistance.  1-800-813-HOPE 251-030-8577) ? [place] Assists [place] cancer patients and their families through emotional , educational and financial support.  3204964284 ? [place] DSS Where to apply for food stamps, Medicaid and utility assistance. 952-569-1062 ? RCATS: Transportation to medical appointments. 517-304-9997 ? Social Security Administration: May apply for disability if have a Stage IV cancer. (502)635-7872 3176363110 ? [employer], Disability and Transit Services: Assists with nutrition, care and transit needs. [place] Support Programs: @10RELATIVEDAYS @ > Cancer Support Group  2nd Tuesday of the month 1pm-2pm, Journey Room  > Creative Journey  3rd Tuesday of the month 1130am-1pm, Journey Room  > Look Good Feel Better  1st Wednesday of the month 10am-12 noon, Journey Room (Call [place] to register [phone])

## 2015-11-16 NOTE — Progress Notes (Signed)
Randall Reyes presents today for injection per MD orders. B12 1000 mcg administered IM in left Upper Arm (deltoid). Administration without incident. Patient tolerated well.

## 2015-12-08 ENCOUNTER — Other Ambulatory Visit (HOSPITAL_COMMUNITY): Payer: Self-pay | Admitting: Internal Medicine

## 2015-12-08 DIAGNOSIS — N184 Chronic kidney disease, stage 4 (severe): Secondary | ICD-10-CM

## 2015-12-08 DIAGNOSIS — Z780 Asymptomatic menopausal state: Secondary | ICD-10-CM

## 2015-12-15 ENCOUNTER — Other Ambulatory Visit (HOSPITAL_COMMUNITY): Payer: Self-pay | Admitting: Internal Medicine

## 2015-12-15 ENCOUNTER — Ambulatory Visit (HOSPITAL_COMMUNITY)
Admission: RE | Admit: 2015-12-15 | Discharge: 2015-12-15 | Disposition: A | Discharge status: Home / Self Care | Payer: Medicare Other | Source: Ambulatory Visit | Attending: Internal Medicine | Admitting: Internal Medicine

## 2015-12-15 DIAGNOSIS — Z780 Asymptomatic menopausal state: Secondary | ICD-10-CM

## 2015-12-15 DIAGNOSIS — M85852 Other specified disorders of bone density and structure, left thigh: Secondary | ICD-10-CM | POA: Diagnosis not present

## 2015-12-15 DIAGNOSIS — Z79899 Other long term (current) drug therapy: Secondary | ICD-10-CM

## 2015-12-16 ENCOUNTER — Ambulatory Visit (HOSPITAL_COMMUNITY)
Admission: RE | Admit: 2015-12-16 | Discharge: 2015-12-16 | Disposition: A | Discharge status: Home / Self Care | Payer: Medicare Other | Source: Ambulatory Visit | Attending: Internal Medicine | Admitting: Internal Medicine

## 2015-12-16 ENCOUNTER — Other Ambulatory Visit (HOSPITAL_COMMUNITY): Payer: Self-pay | Admitting: Internal Medicine

## 2015-12-16 DIAGNOSIS — K449 Diaphragmatic hernia without obstruction or gangrene: Secondary | ICD-10-CM | POA: Diagnosis not present

## 2015-12-16 DIAGNOSIS — M4854XA Collapsed vertebra, not elsewhere classified, thoracic region, initial encounter for fracture: Secondary | ICD-10-CM | POA: Insufficient documentation

## 2015-12-16 DIAGNOSIS — R590 Localized enlarged lymph nodes: Secondary | ICD-10-CM | POA: Insufficient documentation

## 2015-12-16 DIAGNOSIS — X58XXXA Exposure to other specified factors, initial encounter: Secondary | ICD-10-CM | POA: Insufficient documentation

## 2015-12-16 DIAGNOSIS — N184 Chronic kidney disease, stage 4 (severe): Secondary | ICD-10-CM | POA: Diagnosis not present

## 2015-12-19 ENCOUNTER — Encounter (HOSPITAL_COMMUNITY): Payer: Medicare Other | Attending: Oncology

## 2015-12-19 VITALS — BP 119/54 | HR 79 | Temp 97.8°F | Resp 16

## 2015-12-19 DIAGNOSIS — E538 Deficiency of other specified B group vitamins: Secondary | ICD-10-CM | POA: Diagnosis not present

## 2015-12-19 DIAGNOSIS — C9110 Chronic lymphocytic leukemia of B-cell type not having achieved remission: Secondary | ICD-10-CM | POA: Insufficient documentation

## 2015-12-19 DIAGNOSIS — M310 Hypersensitivity angiitis: Secondary | ICD-10-CM | POA: Insufficient documentation

## 2015-12-19 MED ORDER — CYANOCOBALAMIN 1000 MCG/ML IJ SOLN
1000.0000 ug | Freq: Once | INTRAMUSCULAR | Status: AC
  Administered 2015-12-19: 1000 ug via INTRAMUSCULAR
  Filled 2015-12-19: qty 1

## 2015-12-19 NOTE — Progress Notes (Signed)
Randall Reyes presents today for injection per MD orders. B12 1,000 administered IM in left Upper Arm. Administration without incident. Patient tolerated well.

## 2015-12-19 NOTE — Patient Instructions (Signed)
[facility] at [facility] Discharge Instructions  RECOMMENDATIONS MADE BY THE CONSULTANT AND ANY TEST RESULTS WILL BE SENT TO YOUR REFERRING PHYSICIAN.  B12 injection given today per orders. Follow up as scheduled  Thank you for choosing [facility] at [facility] to provide your oncology and hematology care.  To afford each patient quality time with our provider, please arrive at least 15 minutes before your scheduled appointment time.   Beginning January 23rd 2017 lab work for the Cancer Center will be done in the  Main lab at [facility] on 1st floor. If you have a lab appointment with the Cancer Center please come in thru the  Main Entrance and check in at the main information desk  You need to re-schedule your appointment should you arrive 10 or more minutes late.  We strive to give you quality time with our providers, and arriving late affects you and other patients whose appointments are after yours.  Also, if you no show three or more times for appointments you may be dismissed from the clinic at the providers discretion.     Again, thank you for choosing [facility].  Our hope is that these requests will decrease the amount of time that you wait before being seen by our physicians.       _____________________________________________________________  Should you have questions after your visit to [facility], please contact our office at [phone] between the hours of 8:30 a.m. and 4:30 p.m.  Voicemails left after 4:30 p.m. will not be returned until the following business day.  For prescription refill requests, have your pharmacy contact our office.         Resources For Cancer Patients and their Caregivers ? American Cancer Society: Can assist with transportation, wigs, general needs, runs Look Good Feel Better.        1-888-227-6333 ? Cancer Care: Provides financial assistance, online support groups,  medication/co-pay assistance.  1-800-813-HOPE (4673) ? [facility] Assists [place] cancer patients and their families through emotional , educational and financial support.  336-427-4357 ? [place] DSS Where to apply for food stamps, Medicaid and utility assistance. 336-342-1394 ? RCATS: Transportation to medical appointments. 336-347-2287 ? Social Security Administration: May apply for disability if have a Stage IV cancer. 336-342-7796 1-800-772-1213 ? [place] Aging, Disability and Transit Services: Assists with nutrition, care and transit needs. 336-349-2343  Cancer Center Support Programs: @10RELATIVEDAYS@ > Cancer Support Group  2nd Tuesday of the month 1pm-2pm, Journey Room  > Creative Journey  3rd Tuesday of the month 1130am-1pm, Journey Room  > Look Good Feel Better  1st Wednesday of the month 10am-12 noon, Journey Room (Call American Cancer Society to register 1-800-395-5775)   

## 2015-12-26 ENCOUNTER — Encounter: Payer: Self-pay | Admitting: Internal Medicine

## 2016-01-11 ENCOUNTER — Encounter (HOSPITAL_COMMUNITY): Payer: Medicare Other

## 2016-01-12 ENCOUNTER — Encounter (HOSPITAL_COMMUNITY): Payer: Medicare Other

## 2016-01-19 ENCOUNTER — Encounter (HOSPITAL_BASED_OUTPATIENT_CLINIC_OR_DEPARTMENT_OTHER): Payer: Medicare Other

## 2016-01-19 ENCOUNTER — Encounter (HOSPITAL_COMMUNITY): Payer: Medicare Other | Attending: Oncology

## 2016-01-19 ENCOUNTER — Encounter (HOSPITAL_COMMUNITY): Payer: Self-pay

## 2016-01-19 VITALS — BP 125/67 | HR 56 | Temp 97.8°F | Resp 16

## 2016-01-19 DIAGNOSIS — E538 Deficiency of other specified B group vitamins: Secondary | ICD-10-CM | POA: Diagnosis not present

## 2016-01-19 DIAGNOSIS — C9110 Chronic lymphocytic leukemia of B-cell type not having achieved remission: Secondary | ICD-10-CM | POA: Insufficient documentation

## 2016-01-19 DIAGNOSIS — Z95828 Presence of other vascular implants and grafts: Secondary | ICD-10-CM

## 2016-01-19 DIAGNOSIS — M310 Hypersensitivity angiitis: Secondary | ICD-10-CM | POA: Insufficient documentation

## 2016-01-19 MED ORDER — SODIUM CHLORIDE 0.9% FLUSH
10.0000 mL | INTRAVENOUS | Status: DC | PRN
  Administered 2016-01-19: 10 mL via INTRAVENOUS
  Filled 2016-01-19: qty 10

## 2016-01-19 MED ORDER — CYANOCOBALAMIN 1000 MCG/ML IJ SOLN
1000.0000 ug | Freq: Once | INTRAMUSCULAR | Status: AC
Start: 2016-01-19 — End: 2016-01-19
  Administered 2016-01-19: 1000 ug via INTRAMUSCULAR

## 2016-01-19 MED ORDER — CYANOCOBALAMIN 1000 MCG/ML IJ SOLN
INTRAMUSCULAR | Status: AC
  Filled 2016-01-19: qty 1

## 2016-01-19 MED ORDER — HEPARIN SOD (PORK) LOCK FLUSH 100 UNIT/ML IV SOLN
INTRAVENOUS | Status: AC
  Filled 2016-01-19: qty 5

## 2016-01-19 MED ORDER — HEPARIN SOD (PORK) LOCK FLUSH 100 UNIT/ML IV SOLN
500.0000 [IU] | Freq: Once | INTRAVENOUS | Status: AC
  Administered 2016-01-19: 500 [IU] via INTRAVENOUS

## 2016-01-19 NOTE — Progress Notes (Signed)
See other encounter.

## 2016-01-19 NOTE — Patient Instructions (Signed)
[place] at [facility] Discharge Instructions  RECOMMENDATIONS MADE BY THE CONSULTANT AND ANY TEST RESULTS WILL BE SENT TO YOUR REFERRING PHYSICIAN.  You had a B12 injection today and your port flushed.  Return as scheduled. -   Thank you for choosing [place] at [facility] to provide your oncology and hematology care.  To afford each patient quality time with our provider, please arrive at least 15 minutes before your scheduled appointment time.   Beginning January 23rd 2017 lab work for the [facility] will be done in the  Main lab at [facility] on 1st floor. If you have a lab appointment with the [facility] please come in thru the  Main Entrance and check in at the main information desk  You need to re-schedule your appointment should you arrive 10 or more minutes late.  We strive to give you quality time with our providers, and arriving late affects you and other patients whose appointments are after yours.  Also, if you no show three or more times for appointments you may be dismissed from the clinic at the providers discretion.     Again, thank you for choosing [facility].  Our hope is that these requests will decrease the amount of time that you wait before being seen by our physicians.       _____________________________________________________________  Should you have questions after your visit to [facility], please contact our office at [phone] between the hours of 8:30 a.m. and 4:30 p.m.  Voicemails left after 4:30 p.m. will not be returned until the following business day.  For prescription refill requests, have your pharmacy contact our office.         Resources For Cancer Patients and their Caregivers ? American Cancer Society: Can assist with transportation, wigs, general needs, runs Look Good Feel Better.        9308498718 ? Cancer Care: Provides financial assistance,  online support groups, medication/co-pay assistance.  1-800-813-HOPE 3033601569) ? [facility] Assists [place] cancer patients and their families through emotional , educational and financial support.  (726)042-9862 ? [place] DSS Where to apply for food stamps, Medicaid and utility assistance. 684-455-8175 ? RCATS: Transportation to medical appointments. (919)629-7923 ? Social Security Administration: May apply for disability if have a Stage IV cancer. 601-669-3196 225-830-2325 ? [employer], Disability and Transit Services: Assists with nutrition, care and transit needs. [place] Support Programs: @10RELATIVEDAYS @ > Cancer Support Group  2nd Tuesday of the month 1pm-2pm, Journey Room  > Creative Journey  3rd Tuesday of the month 1130am-1pm, Journey Room  > Look Good Feel Better  1st Wednesday of the month 10am-12 noon, Journey Room (Call [place] to register [phone])

## 2016-01-19 NOTE — Progress Notes (Signed)
Pt here today for B12 injection and port flush. Pt given B12 injection in right deltoid. Randall Reyes presented for Portacath access and flush.   Portacath located right chest wall accessed with  H 20 needle.  No blood return, no discomfort or pain noted. Portacath flushed with 104ml NS and 500U/47ml Heparin and needle removed intact.  Procedure tolerated well and without incident.  Pt stable and discharged home ambulatory.

## 2016-01-20 ENCOUNTER — Ambulatory Visit (INDEPENDENT_AMBULATORY_CARE_PROVIDER_SITE_OTHER): Payer: Medicare Other | Admitting: Gastroenterology

## 2016-01-20 ENCOUNTER — Encounter: Payer: Self-pay | Admitting: Gastroenterology

## 2016-01-20 VITALS — BP 113/62 | HR 63 | Temp 97.7°F | Ht 60.0 in | Wt 137.4 lb

## 2016-01-20 DIAGNOSIS — Z1211 Encounter for screening for malignant neoplasm of colon: Secondary | ICD-10-CM | POA: Diagnosis not present

## 2016-01-20 DIAGNOSIS — D649 Anemia, unspecified: Secondary | ICD-10-CM | POA: Diagnosis not present

## 2016-01-20 NOTE — Progress Notes (Addendum)
REVIEWED-NO ADDITIONAL RECOMMENDATIONS.  Primary Care Physician:  [name], MD Primary Gastroenterologist:  Dr. Oneida Alar   Chief Complaint  Patient presents with  . Colonoscopy    hx of anemia  . EGD    HPI:   Randall Reyes is a 80 y.o. male presenting today at the request of his PCP for initial screening colonoscopy and possible EGD. He is followed by Oncology due to history of CLL.   No abdominal pain. No constipation or diarrhea. No prior colonoscopy. No bright red blood per rectum. Dr. Maudie Mercury had placed on iron but patient stopped because it had turned his stool black, so he stopped. Was on iron pills many year ago but "Dr. Everette Rank took me off because I didn't need it anymore". Some question of heme positive stool. Very rarely will take a Rolaid. Does not have to take a PPI every day. No dysphagia. No N/V. No loss of appetite or unexplained weight loss.   Last EGD in 2008 by Dr. Oneida Alar with 1 cm duodenal ulcer.   Past Medical History:  Diagnosis Date  . Anemia   . B12 deficiency 12/07/2014  . CLL (chronic lymphocytic leukemia) ([place]) 01/24/2011  . Diabetes mellitus   . DM (diabetes mellitus) ([place]) 01/24/2011  . Eczema 01/24/2011  . History of pneumonia 04/2014  . Hypertension   . Leukemia ([place]) 6.22.2012   PER PATIENT  . Melanoma in situ ([place]) 01/24/2011  . Mild obesity 01/24/2011  . Port catheter in place 05/02/2012  . Shingles   . Ulcer     Past Surgical History:  Procedure Laterality Date  . bleeding ulcer    . CATARACT EXTRACTION W/PHACO  05/05/2012   Procedure: CATARACT EXTRACTION PHACO AND INTRAOCULAR LENS PLACEMENT (IOC);  Surgeon: Tonny Branch, MD;  Location: [facility];  Service: Ophthalmology;  Laterality: Right;  CDE:13.25  . CATARACT EXTRACTION W/PHACO  05/15/2012   Procedure: CATARACT EXTRACTION PHACO AND INTRAOCULAR LENS PLACEMENT (IOC);  Surgeon: Tonny Branch, MD;  Location: [facility];  Service: Ophthalmology;  Laterality: Left;  CDE:  12.32  .  ESOPHAGOGASTRODUODENOSCOPY  2008   Dr. Oneida Alar: normal esophagus, antral erythema, 1 cm clean based duodenal ulcera, negative H. plyori  . HERNIA REPAIR  2001  . PORTACATH PLACEMENT  2008    Current Outpatient Prescriptions  Medication Sig Dispense Refill  . ACCU-CHEK AVIVA PLUS test strip     . acetaminophen (TYLENOL) 500 MG tablet Take 500 mg by mouth at bedtime as needed.     . cholecalciferol (VITAMIN D) 400 units TABS tablet Take 400 Units by mouth daily.    [name] Cod Liver Oil w/Vit A & D CAPS Take 1 capsule by mouth daily.    . rosuvastatin (CRESTOR) 20 MG tablet Take 40 mg by mouth daily.     . sitaGLIPtin (JANUVIA) 100 MG tablet Take 100 mg by mouth daily.    . fluocinonide cream (LIDEX) 0.05 % APPLY TO AFFECTED AREA UP TO TWICE A DAY AS NEEDED NOT TO FACE, GROIN, UNDER ARMS  3   No current facility-administered medications for this visit.     Allergies as of 01/20/2016 - Review Complete 01/20/2016  Allergen Reaction Noted  . Aspirin Other (See Comments) 01/24/2011  . Ibuprofen Other (See Comments) 01/24/2011    Family History  Problem Relation Age of Onset  . Diabetes Father   . Colon cancer Neg Hx     Social History   Social History  . Marital status: Married  Spouse name: N/A  . Number of children: N/A  . Years of education: N/A   Occupational History  . Not on file.   Social History Main Topics  . Smoking status: Former Research scientist (life sciences)  . Smokeless tobacco: Former Systems developer    Quit date: 04/25/1967  . Alcohol use No  . Drug use: No  . Sexual activity: Not on file   Other Topics Concern  . Not on file   Social History Narrative  . No narrative on file    Review of Systems: Gen: see HPI  CV: Denies chest pain, heart palpitations, peripheral edema, syncope.  Resp: +DOE GI: see HPI  GU : Denies urinary burning, urinary frequency, urinary hesitancy MS: Denies joint pain, muscle weakness, cramps, or limitation of movement.  Derm: Denies rash, itching, dry  skin Psych: Denies depression, anxiety, memory loss, and confusion Heme: +enlarged lymph nodes (CLL)   Physical Exam: BP 113/62   Pulse 63   Temp 97.7 F (36.5 C) (Oral)   Ht 5' (1.524 m)   Wt 137 lb 6.4 oz (62.3 kg)   BMI 26.83 kg/m  General:   Alert and oriented. Pleasant and cooperative. Well-nourished and well-developed.  Head:  Normocephalic and atraumatic. Eyes:  Without icterus, sclera clear and conjunctiva pink.  Ears:  Normal auditory acuity. Nose:  No deformity, discharge,  or lesions. Lungs:  Clear to auscultation bilaterally. No wheezes, rales, or rhonchi. No distress.  Heart:  S1, S2 present without murmurs appreciated.  Abdomen:  +BS, soft, non-tender and non-distended. No HSM noted. No guarding or rebound. No masses appreciated.  Rectal:  Deferred  Msk:  Symmetrical without gross deformities. Normal posture. Extremities:  Without edema. Neurologic:  Alert and  oriented x4;  grossly normal neurologically. Psych:  Alert and cooperative. Normal mood and affect.  Lab Results  Component Value Date   WBC 41.6 (H) 11/16/2015   HGB 13.0 11/16/2015   HCT 39.0 11/16/2015   MCV 103.7 (H) 11/16/2015   PLT 192 11/16/2015   Lab Results  Component Value Date   IRON 82 09/13/2015   TIBC 297 09/13/2015   FERRITIN 62 09/13/2015    OUTSIDE LABS December 06, 2015:  Hgb 12, Hct 35

## 2016-01-20 NOTE — Assessment & Plan Note (Signed)
80 year old male with need for initial screening colonoscopy. No concerning lower GI symptoms. No FH of colon cancer.   Proceed with colonoscopy with Dr. Oneida Alar in the near future. The risks, benefits, and alternatives have been discussed in detail with the patient. They state understanding and desire to proceed.  Hold Januvia the day of the procedure

## 2016-01-20 NOTE — Patient Instructions (Signed)
We have scheduled you for a colonoscopy and possibly an upper endoscopy with Dr. Oneida Alar.  You may need further blood work after review of what you have already have.  Do not take Januvia the day of the procedure.

## 2016-01-20 NOTE — Progress Notes (Signed)
CC'ED TO PCP 

## 2016-01-20 NOTE — Assessment & Plan Note (Signed)
Referred by PCP due to anemia in the setting of CLL. Followed by Hematology. July 2017 Hgb 13, late July Hgb 12. Last ferritin 62 in May, iron 82, TIBC 297. Likely anemia of chronic disease and multifactorial. However, will get the most recent labs from Dr. Lowanda Foster and recheck iron, TIBC, ferritin. If any evidence of evolving IDA, would proceed with EGD at time of colonoscopy. However, he has no upper GI symptoms of note. Remote history of clean-based duodenal ulcer in 2008 but no need for surveillance of this.

## 2016-01-23 ENCOUNTER — Other Ambulatory Visit: Payer: Self-pay

## 2016-01-23 DIAGNOSIS — Z1211 Encounter for screening for malignant neoplasm of colon: Secondary | ICD-10-CM

## 2016-01-23 MED ORDER — NA SULFATE-K SULFATE-MG SULF 17.5-3.13-1.6 GM/177ML PO SOLN: 1.0000 | ORAL | 0 refills | Status: DC

## 2016-01-30 ENCOUNTER — Ambulatory Visit (HOSPITAL_COMMUNITY)
Admission: RE | Admit: 2016-01-30 | Discharge: 2016-01-30 | Disposition: A | Discharge status: Home / Self Care | Payer: Medicare Other | Source: Ambulatory Visit | Attending: Gastroenterology | Admitting: Gastroenterology

## 2016-01-30 ENCOUNTER — Encounter (HOSPITAL_COMMUNITY)
Admission: RE | Disposition: A | Discharge status: Home / Self Care | Payer: Self-pay | Source: Ambulatory Visit | Attending: Gastroenterology

## 2016-01-30 DIAGNOSIS — E119 Type 2 diabetes mellitus without complications: Secondary | ICD-10-CM | POA: Diagnosis not present

## 2016-01-30 DIAGNOSIS — I10 Essential (primary) hypertension: Secondary | ICD-10-CM | POA: Diagnosis not present

## 2016-01-30 DIAGNOSIS — Z7984 Long term (current) use of oral hypoglycemic drugs: Secondary | ICD-10-CM | POA: Diagnosis not present

## 2016-01-30 DIAGNOSIS — K648 Other hemorrhoids: Secondary | ICD-10-CM | POA: Insufficient documentation

## 2016-01-30 DIAGNOSIS — Z856 Personal history of leukemia: Secondary | ICD-10-CM | POA: Diagnosis not present

## 2016-01-30 DIAGNOSIS — Z1211 Encounter for screening for malignant neoplasm of colon: Secondary | ICD-10-CM | POA: Diagnosis not present

## 2016-01-30 DIAGNOSIS — Z79899 Other long term (current) drug therapy: Secondary | ICD-10-CM | POA: Diagnosis not present

## 2016-01-30 DIAGNOSIS — Z87891 Personal history of nicotine dependence: Secondary | ICD-10-CM | POA: Diagnosis not present

## 2016-01-30 HISTORY — PX: COLONOSCOPY: SHX5424

## 2016-01-30 HISTORY — PX: ESOPHAGOGASTRODUODENOSCOPY: SHX5428

## 2016-01-30 SURGERY — COLONOSCOPY
Anesthesia: Moderate Sedation

## 2016-01-30 MED ORDER — MEPERIDINE HCL 100 MG/ML IJ SOLN
INTRAMUSCULAR | Status: AC
  Filled 2016-01-30: qty 2

## 2016-01-30 MED ORDER — SODIUM CHLORIDE 0.9 % IV SOLN
INTRAVENOUS | Status: DC
  Administered 2016-01-30: 11:00:00 via INTRAVENOUS

## 2016-01-30 MED ORDER — STERILE WATER FOR IRRIGATION IR SOLN
Status: DC | PRN
  Administered 2016-01-30: 2.5 mL

## 2016-01-30 MED ORDER — MEPERIDINE HCL 100 MG/ML IJ SOLN
INTRAMUSCULAR | Status: DC | PRN
  Administered 2016-01-30 (×2): 25 mg via INTRAVENOUS

## 2016-01-30 MED ORDER — MIDAZOLAM HCL 5 MG/5ML IJ SOLN
INTRAMUSCULAR | Status: AC
  Filled 2016-01-30: qty 10

## 2016-01-30 MED ORDER — MIDAZOLAM HCL 5 MG/5ML IJ SOLN
INTRAMUSCULAR | Status: DC | PRN
  Administered 2016-01-30: 2 mg via INTRAVENOUS
  Administered 2016-01-30: 1 mg via INTRAVENOUS

## 2016-01-30 NOTE — Discharge Instructions (Signed)
You have SMALL internal hemorrhoids. YOU DID NOT HAVE ANY POLYPS.     DRINK WATER TO KEEP YOUR URINE LIGHT YELLOW.  FOLLOW A HIGH FIBER DIET. AVOID ITEMS THAT CAUSE BLOATING & GAS. SEE INFO BELOW.  Next colonoscopy in 10-15 years if the benefits outweigh the risks.   Colonoscopy Care After Read the instructions outlined below and refer to this sheet in the next week. These discharge instructions provide you with general information on caring for yourself after you leave the hospital. While your treatment has been planned according to the most current medical practices available, unavoidable complications occasionally occur. If you have any problems or questions after discharge, call DR. Raeford Brandenburg, [phone].  ACTIVITY  You may resume your regular activity, but move at a slower pace for the next 24 hours.   Take frequent rest periods for the next 24 hours.   Walking will help get rid of the air and reduce the bloated feeling in your belly (abdomen).   No driving for 24 hours (because of the medicine (anesthesia) used during the test).   You may shower.   Do not sign any important legal documents or operate any machinery for 24 hours (because of the anesthesia used during the test).    NUTRITION  Drink plenty of fluids.   You may resume your normal diet as instructed by your doctor.   Begin with a light meal and progress to your normal diet. Heavy or fried foods are harder to digest and may make you feel sick to your stomach (nauseated).   Avoid alcoholic beverages for 24 hours or as instructed.    MEDICATIONS  You may resume your normal medications.   WHAT YOU CAN EXPECT TODAY  Some feelings of bloating in the abdomen.   Passage of more gas than usual.   Spotting of blood in your stool or on the toilet paper  .  IF YOU HAD POLYPS REMOVED DURING THE COLONOSCOPY:  Eat a soft diet IF YOU HAVE NAUSEA, BLOATING, ABDOMINAL PAIN, OR VOMITING.    FINDING OUT THE  RESULTS OF YOUR TEST Not all test results are available during your visit. DR. Oneida Alar WILL CALL YOU WITHIN 14 DAYS OF YOUR PROCEDUE WITH YOUR RESULTS. Do not assume everything is normal if you have not heard from DR. Shirline Kendle, CALL HER OFFICE AT [phone].  SEEK IMMEDIATE MEDICAL ATTENTION AND CALL THE OFFICE: [phone] IF:  You have more than a spotting of blood in your stool.   Your belly is swollen (abdominal distention).   You are nauseated or vomiting.   You have a temperature over 101F.   You have abdominal pain or discomfort that is severe or gets worse throughout the day.   High-Fiber Diet A high-fiber diet changes your normal diet to include more whole grains, legumes, fruits, and vegetables. Changes in the diet involve replacing refined carbohydrates with unrefined foods. The calorie level of the diet is essentially unchanged. The Dietary Reference Intake (recommended amount) for adult males is 38 grams per day. For adult females, it is 25 grams per day. Pregnant and lactating women should consume 28 grams of fiber per day. Fiber is the intact part of a plant that is not broken down during digestion. Functional fiber is fiber that has been isolated from the plant to provide a beneficial effect in the body. PURPOSE  Increase stool bulk.   Ease and regulate bowel movements.   Lower cholesterol.   REDUCE RISK OF COLON CANCER  INDICATIONS THAT  YOU NEED MORE FIBER  Constipation and hemorrhoids.   Uncomplicated diverticulosis (intestine condition) and irritable bowel syndrome.   Weight management.   As a protective measure against hardening of the arteries (atherosclerosis), diabetes, and cancer.   GUIDELINES FOR INCREASING FIBER IN THE DIET  Start adding fiber to the diet slowly. A gradual increase of about 5 more grams (2 slices of whole-wheat bread, 2 servings of most fruits or vegetables, or 1 bowl of high-fiber cereal) per day is best. Too rapid an increase in  fiber may result in constipation, flatulence, and bloating.   Drink enough water and fluids to keep your urine clear or pale yellow. Water, juice, or caffeine-free drinks are recommended. Not drinking enough fluid may cause constipation.   Eat a variety of high-fiber foods rather than one type of fiber.   Try to increase your intake of fiber through using high-fiber foods rather than fiber pills or supplements that contain small amounts of fiber.   The goal is to change the types of food eaten. Do not supplement your present diet with high-fiber foods, but replace foods in your present diet.   INCLUDE A VARIETY OF FIBER SOURCES  Replace refined and processed grains with whole grains, canned fruits with fresh fruits, and incorporate other fiber sources. White rice, white breads, and most bakery goods contain little or no fiber.   Brown whole-grain rice, buckwheat oats, and many fruits and vegetables are all good sources of fiber. These include: broccoli, Brussels sprouts, cabbage, cauliflower, beets, sweet potatoes, white potatoes (skin on), carrots, tomatoes, eggplant, squash, berries, fresh fruits, and dried fruits.   Cereals appear to be the richest source of fiber. Cereal fiber is found in whole grains and bran. Bran is the fiber-rich outer coat of cereal grain, which is largely removed in refining. In whole-grain cereals, the bran remains. In breakfast cereals, the largest amount of fiber is found in those with "bran" in their names. The fiber content is sometimes indicated on the label.   You may need to include additional fruits and vegetables each day.   In baking, for 1 cup white flour, you may use the following substitutions:   1 cup whole-wheat flour minus 2 tablespoons.   1/2 cup white flour plus 1/2 cup whole-wheat flour.   Hemorrhoids Hemorrhoids are dilated (enlarged) veins around the rectum. Sometimes clots will form in the veins. This makes them swollen and painful. These  are called thrombosed hemorrhoids. Causes of hemorrhoids include:  Constipation.   Straining to have a bowel movement.   HEAVY LIFTING  HOME CARE INSTRUCTIONS  Eat a well balanced diet and drink 6 to 8 glasses of water every day to avoid constipation. You may also use a bulk laxative.   Avoid straining to have bowel movements.   Keep anal area dry and clean.   Do not use a donut shaped pillow or sit on the toilet for long periods. This increases blood pooling and pain.   Move your bowels when your body has the urge; this will require less straining and will decrease pain and pressure.

## 2016-01-30 NOTE — Op Note (Signed)
[facility] Patient Name: Randall Reyes Procedure Date: 01/30/2016 12:41 PM MRN: [id number] Date of Birth: 1935/09/29 Attending MD: Barney Drain , MD CSN: [id number] Age: 80 Admit Type: Outpatient Procedure:                Colonoscopy, SCREENING Indications:              Screening for colorectal malignant neoplasm Providers:                Barney Drain, MD, Lurline Del, RN, Purcell Nails. [place],                            Merchant navy officer Referring MD:             Teodora Medici. Kim Medicines:                Meperidine 50 mg IV, Midazolam 3 mg IV Complications:            No immediate complications. Estimated Blood Loss:     Estimated blood loss was minimal. Procedure:                Pre-Anesthesia Assessment:                           - Prior to the procedure, a History and Physical                            was performed, and patient medications and                            allergies were reviewed. The patient's tolerance of                            previous anesthesia was also reviewed. The risks                            and benefits of the procedure and the sedation                            options and risks were discussed with the patient.                            All questions were answered, and informed consent                            was obtained. Prior Anticoagulants: The patient has                            taken no previous anticoagulant or antiplatelet                            agents. ASA Grade Assessment: II - A patient with                            mild systemic disease. After reviewing the risks  and benefits, the patient was deemed in                            satisfactory condition to undergo the procedure.                            After obtaining informed consent, the colonoscope                            was passed under direct vision. Throughout the                            procedure, the patient's blood pressure, pulse,  and                            oxygen saturations were monitored continuously. The                            [id number] scope was introduced through                            the anus and advanced to the the cecum, identified                            by appendiceal orifice and ileocecal valve. The                            ileocecal valve, appendiceal orifice, and rectum                            were photographed. The colonoscopy was performed                            without difficulty. The patient tolerated the                            procedure well. The quality of the bowel                            preparation was excellent. Scope In: 12:55:10 PM Scope Out: 1:08:15 PM Scope Withdrawal Time: 0 hours 9 minutes 33 seconds  Total Procedure Duration: 0 hours 13 minutes 5 seconds  Findings:      The colon (entire examined portion) appeared normal.      Non-bleeding internal hemorrhoids were found. The hemorrhoids were small. Impression:               - The entire examined colon is normal.                           - Non-bleeding internal hemorrhoids. Moderate Sedation:      Moderate (conscious) sedation was administered by the endoscopy nurse       and supervised by the endoscopist. The following parameters were       monitored: oxygen saturation, heart rate, blood pressure, and response  to care. Total physician intraservice time was 23 minutes. Recommendation:           - High fiber diet.                           - Continue present medications.                           - Repeat colonoscopy 10-15 YEARS IF THE BENEFITS                            OUTWEIGH THE RISKS for surveillance IF THE BENEFITS                            OUTWEIGH THE RISKS.                           - Patient has a contact number available for                            emergencies. The signs and symptoms of potential                            delayed complications were discussed with the                             patient. Return to normal activities tomorrow.                            Written discharge instructions were provided to the                            patient. Procedure Code(s):        --- Professional ---                           [phone], Colonoscopy, flexible; diagnostic, including                            collection of specimen(s) by brushing or washing,                            when performed (separate procedure)                           99152, Moderate sedation services provided by the                            same physician or other qualified health care                            professional performing the diagnostic or                            therapeutic service that the sedation supports,  requiring the presence of an independent trained                            observer to assist in the monitoring of the                            patient's level of consciousness and physiological                            status; initial 15 minutes of intraservice time,                            patient age 60 years or older                           [phone], Moderate sedation services; each additional                            15 minutes intraservice time Diagnosis Code(s):        --- Professional ---                           Z12.11, Encounter for screening for malignant                            neoplasm of colon                           K64.8, Other hemorrhoids CPT copyright 2016 American [employer] Association. All rights reserved. The codes documented in this report are preliminary and upon coder review may  be revised to meet current compliance requirements. Barney Drain, MD Barney Drain, MD 01/30/2016 1:21:37 PM This report has been signed electronically. Number of Addenda: 0

## 2016-01-30 NOTE — H&P (Signed)
Primary Care Physician:  [name], MD Primary Gastroenterologist:  Dr. Oneida Alar  Pre-Procedure History & Physical: HPI:  Randall Reyes is a 80 y.o. male here for [place].  Past Medical History:  Diagnosis Date  . Anemia   . B12 deficiency 12/07/2014  . CLL (chronic lymphocytic leukemia) ([place]) 01/24/2011  . Diabetes mellitus   . DM (diabetes mellitus) ([place]) 01/24/2011  . Eczema 01/24/2011  . History of pneumonia 04/2014  . Hypertension   . Leukemia ([place]) 6.22.2012   PER PATIENT  . Melanoma in situ ([place]) 01/24/2011  . Mild obesity 01/24/2011  . Port catheter in place 05/02/2012  . Shingles   . Ulcer     Past Surgical History:  Procedure Laterality Date  . bleeding ulcer    . CATARACT EXTRACTION W/PHACO  05/05/2012   Procedure: CATARACT EXTRACTION PHACO AND INTRAOCULAR LENS PLACEMENT (IOC);  Surgeon: Tonny Branch, MD;  Location: [facility];  Service: Ophthalmology;  Laterality: Right;  CDE:13.25  . CATARACT EXTRACTION W/PHACO  05/15/2012   Procedure: CATARACT EXTRACTION PHACO AND INTRAOCULAR LENS PLACEMENT (IOC);  Surgeon: Tonny Branch, MD;  Location: [facility];  Service: Ophthalmology;  Laterality: Left;  CDE:  12.32  . ESOPHAGOGASTRODUODENOSCOPY  2008   Dr. Oneida Alar: normal esophagus, antral erythema, 1 cm clean based duodenal ulcera, negative H. plyori  . HERNIA REPAIR  2001  . PORTACATH PLACEMENT  2008    Prior to Admission medications   Medication Sig Start Date End Date Taking? Authorizing Provider  acetaminophen (TYLENOL) 500 MG tablet Take 500 mg by mouth at bedtime as needed.    Yes Historical Provider, MD  cholecalciferol (VITAMIN D) 400 units TABS tablet Take 400 Units by mouth daily.   Yes Historical Provider, MD  [facility] Liver Oil w/Vit A & D CAPS Take 1 capsule by mouth daily.   Yes Historical Provider, MD  Na Sulfate-K Sulfate-Mg Sulf (SUPREP BOWEL PREP KIT) 17.5-3.13-1.6 GM/180ML SOLN Take 1 kit by mouth as directed. 01/23/16  Yes Danie Binder, MD  rosuvastatin  (CRESTOR) 20 MG tablet Take 40 mg by mouth daily.    Yes Historical Provider, MD  sitaGLIPtin (JANUVIA) 100 MG tablet Take 100 mg by mouth daily.   Yes Historical Provider, MD  ACCU-CHEK AVIVA PLUS test strip  10/14/15   Historical Provider, MD  fluocinonide cream (LIDEX) 0.05 % APPLY TO AFFECTED AREA UP TO TWICE A DAY AS NEEDED NOT TO Rodney Langton, UNDER ARMS 11/25/14   Historical Provider, MD    Allergies as of 01/23/2016 - Review Complete 01/20/2016  Allergen Reaction Noted  . Aspirin Other (See Comments) 01/24/2011  . Ibuprofen Other (See Comments) 01/24/2011    Family History  Problem Relation Age of Onset  . Diabetes Father   . Colon cancer Neg Hx     Social History   Social History  . Marital status: Married    Spouse name: N/A  . Number of children: N/A  . Years of education: N/A   Occupational History  . Not on file.   Social History Main Topics  . Smoking status: Former Research scientist (life sciences)  . Smokeless tobacco: Former Systems developer    Quit date: 04/25/1967  . Alcohol use No  . Drug use: No  . Sexual activity: Not on file   Other Topics Concern  . Not on file   Social History Narrative  . No narrative on file    Review of Systems: See HPI, otherwise negative ROS   Physical Exam: BP 130/72  Pulse 65   Temp 98.1 F (36.7 C) (Oral)   Resp 16   Ht 5' (1.524 m)   Wt 137 lb (62.1 kg)   SpO2 97%   BMI 26.76 kg/m  General:   Alert,  pleasant and cooperative in NAD Head:  Normocephalic and atraumatic. Neck:  Supple; Lungs:  Clear throughout to auscultation.    Heart:  Regular rate and rhythm. Abdomen:  Soft, nontender and nondistended. Normal bowel sounds, without guarding, and without rebound.   Neurologic:  Alert and  oriented x4;  grossly normal neurologically.  Impression/Plan:     SCREENING  Plan:  1. TCS TODAY

## 2016-02-07 ENCOUNTER — Encounter (HOSPITAL_COMMUNITY): Payer: Self-pay | Admitting: Gastroenterology

## 2016-02-16 ENCOUNTER — Ambulatory Visit (HOSPITAL_COMMUNITY): Payer: Medicare Other | Admitting: Oncology

## 2016-02-20 ENCOUNTER — Ambulatory Visit (HOSPITAL_COMMUNITY): Payer: Medicare Other

## 2016-02-21 ENCOUNTER — Ambulatory Visit (HOSPITAL_COMMUNITY): Payer: Medicare Other | Admitting: Oncology

## 2016-02-21 ENCOUNTER — Other Ambulatory Visit (HOSPITAL_COMMUNITY): Payer: Medicare Other

## 2016-02-21 ENCOUNTER — Ambulatory Visit (HOSPITAL_COMMUNITY): Payer: Medicare Other

## 2016-02-21 ENCOUNTER — Encounter (HOSPITAL_COMMUNITY): Payer: Medicare Other

## 2016-02-29 ENCOUNTER — Other Ambulatory Visit (HOSPITAL_COMMUNITY)
Admission: RE | Admit: 2016-02-29 | Discharge: 2016-02-29 | Disposition: A | Discharge status: Home / Self Care | Payer: Medicare Other | Source: Ambulatory Visit | Attending: Nephrology | Admitting: Nephrology

## 2016-02-29 ENCOUNTER — Encounter (HOSPITAL_COMMUNITY): Payer: Medicare Other

## 2016-02-29 ENCOUNTER — Encounter (HOSPITAL_BASED_OUTPATIENT_CLINIC_OR_DEPARTMENT_OTHER): Payer: Medicare Other

## 2016-02-29 ENCOUNTER — Encounter (HOSPITAL_COMMUNITY): Payer: Medicare Other | Attending: Oncology | Admitting: Oncology

## 2016-02-29 ENCOUNTER — Encounter (HOSPITAL_COMMUNITY): Payer: Self-pay | Admitting: Oncology

## 2016-02-29 DIAGNOSIS — I10 Essential (primary) hypertension: Secondary | ICD-10-CM | POA: Diagnosis present

## 2016-02-29 DIAGNOSIS — N183 Chronic kidney disease, stage 3 (moderate): Secondary | ICD-10-CM | POA: Insufficient documentation

## 2016-02-29 DIAGNOSIS — Z0000 Encounter for general adult medical examination without abnormal findings: Secondary | ICD-10-CM

## 2016-02-29 DIAGNOSIS — Z79899 Other long term (current) drug therapy: Secondary | ICD-10-CM | POA: Insufficient documentation

## 2016-02-29 DIAGNOSIS — C9110 Chronic lymphocytic leukemia of B-cell type not having achieved remission: Secondary | ICD-10-CM | POA: Diagnosis not present

## 2016-02-29 DIAGNOSIS — D801 Nonfamilial hypogammaglobulinemia: Secondary | ICD-10-CM | POA: Diagnosis not present

## 2016-02-29 DIAGNOSIS — E538 Deficiency of other specified B group vitamins: Secondary | ICD-10-CM

## 2016-02-29 DIAGNOSIS — Z23 Encounter for immunization: Secondary | ICD-10-CM

## 2016-02-29 LAB — CBC WITH DIFFERENTIAL/PLATELET
BAND NEUTROPHILS: 0 %
BASOS ABS: 0 10*3/uL (ref 0.0–0.1)
BLASTS: 0 %
Basophils Relative: 0 %
EOS ABS: 1.1 10*3/uL — AB (ref 0.0–0.7)
EOS PCT: 1 %
HCT: 36.1 % — ABNORMAL LOW (ref 39.0–52.0)
Hemoglobin: 11.7 g/dL — ABNORMAL LOW (ref 13.0–17.0)
LYMPHS ABS: 95.8 10*3/uL — AB (ref 0.7–4.0)
Lymphocytes Relative: 89 %
MCH: 35.3 pg — ABNORMAL HIGH (ref 26.0–34.0)
MCHC: 32.4 g/dL (ref 30.0–36.0)
MCV: 109.1 fL — ABNORMAL HIGH (ref 78.0–100.0)
METAMYELOCYTES PCT: 0 %
MONOS PCT: 3 %
MYELOCYTES: 0 %
Monocytes Absolute: 3.2 10*3/uL — ABNORMAL HIGH (ref 0.1–1.0)
NEUTROS ABS: 7.5 10*3/uL (ref 1.7–7.7)
Neutrophils Relative %: 7 %
Other: 0 %
Platelets: 182 10*3/uL (ref 150–400)
Promyelocytes Absolute: 0 %
RBC: 3.31 MIL/uL — AB (ref 4.22–5.81)
RDW: 14.9 % (ref 11.5–15.5)
WBC: 107.6 10*3/uL — AB (ref 4.0–10.5)
nRBC: 0 /100 WBC

## 2016-02-29 LAB — COMPREHENSIVE METABOLIC PANEL
ALBUMIN: 4.2 g/dL (ref 3.5–5.0)
ALT: 18 U/L (ref 17–63)
ANION GAP: 6 (ref 5–15)
AST: 23 U/L (ref 15–41)
Alkaline Phosphatase: 84 U/L (ref 38–126)
BUN: 23 mg/dL — AB (ref 6–20)
CHLORIDE: 109 mmol/L (ref 101–111)
CO2: 24 mmol/L (ref 22–32)
Calcium: 8.9 mg/dL (ref 8.9–10.3)
Creatinine, Ser: 2.15 mg/dL — ABNORMAL HIGH (ref 0.61–1.24)
GFR calc Af Amer: 32 mL/min — ABNORMAL LOW (ref >60)
GFR calc non Af Amer: 28 mL/min — ABNORMAL LOW (ref >60)
GLUCOSE: 111 mg/dL — AB (ref 65–99)
POTASSIUM: 5.1 mmol/L (ref 3.5–5.1)
SODIUM: 139 mmol/L (ref 135–145)
Total Bilirubin: 0.5 mg/dL (ref 0.3–1.2)
Total Protein: 8 g/dL (ref 6.5–8.1)

## 2016-02-29 LAB — PHOSPHORUS: Phosphorus: 3.5 mg/dL (ref 2.5–4.6)

## 2016-02-29 LAB — LACTATE DEHYDROGENASE: LDH: 229 U/L — ABNORMAL HIGH (ref 98–192)

## 2016-02-29 MED ORDER — CYANOCOBALAMIN 1000 MCG/ML IJ SOLN
1000.0000 ug | Freq: Once | INTRAMUSCULAR | Status: AC
  Administered 2016-02-29: 1000 ug via INTRAMUSCULAR

## 2016-02-29 MED ORDER — HEPARIN SOD (PORK) LOCK FLUSH 100 UNIT/ML IV SOLN
INTRAVENOUS | Status: AC
  Filled 2016-02-29: qty 5

## 2016-02-29 MED ORDER — INFLUENZA VAC SPLIT QUAD 0.5 ML IM SUSY
0.5000 mL | PREFILLED_SYRINGE | Freq: Once | INTRAMUSCULAR | Status: AC
  Administered 2016-02-29: 0.5 mL via INTRAMUSCULAR
  Filled 2016-02-29: qty 0.5

## 2016-02-29 MED ORDER — SODIUM CHLORIDE 0.9% FLUSH
20.0000 mL | INTRAVENOUS | Status: DC | PRN
  Administered 2016-02-29: 20 mL via INTRAVENOUS
  Filled 2016-02-29: qty 20

## 2016-02-29 MED ORDER — HEPARIN SOD (PORK) LOCK FLUSH 100 UNIT/ML IV SOLN
500.0000 [IU] | Freq: Once | INTRAVENOUS | Status: AC
  Administered 2016-02-29: 500 [IU] via INTRAVENOUS

## 2016-02-29 MED ORDER — CYANOCOBALAMIN 1000 MCG/ML IJ SOLN
INTRAMUSCULAR | Status: AC
  Filled 2016-02-29: qty 1

## 2016-02-29 NOTE — Patient Instructions (Signed)
Randall Reyes at [facility] Discharge Instructions  RECOMMENDATIONS MADE BY THE CONSULTANT AND ANY TEST RESULTS WILL BE SENT TO YOUR REFERRING PHYSICIAN.  You were seen by [name] today Return in 8 weeks for follow up and labs.  Thank you for choosing [name] at [facility] to provide your oncology and hematology care.  To afford each patient quality time with our provider, please arrive at least 15 minutes before your scheduled appointment time.   Beginning January 23rd 2017 lab work for the [facility] will be done in the  Main lab at [facility] on 1st floor. If you have a lab appointment with the [facility] please come in thru the  Main Entrance and check in at the main information desk  You need to re-schedule your appointment should you arrive 10 or more minutes late.  We strive to give you quality time with our providers, and arriving late affects you and other patients whose appointments are after yours.  Also, if you no show three or more times for appointments you may be dismissed from the clinic at the providers discretion.     Again, thank you for choosing [facility].  Our hope is that these requests will decrease the amount of time that you wait before being seen by our physicians.       _____________________________________________________________  Should you have questions after your visit to [facility], please contact our office at [phone] between the hours of 8:30 a.m. and 4:30 p.m.  Voicemails left after 4:30 p.m. will not be returned until the following business day.  For prescription refill requests, have your pharmacy contact our office.         Resources For Cancer Patients and their Caregivers ? American Cancer Society: Can assist with transportation, wigs, general needs, runs Look Good Feel Better.        505-247-7383 ? Cancer Care: Provides financial assistance, online support  groups, medication/co-pay assistance.  1-800-813-HOPE 445-882-1630) ? [place] Assists [place] cancer patients and their families through emotional , educational and financial support.  (878)482-1540 ? [place] DSS Where to apply for food stamps, Medicaid and utility assistance. (210)632-6453 ? RCATS: Transportation to medical appointments. 818 271 9359 ? Social Security Administration: May apply for disability if have a Stage IV cancer. (424) 630-8236 (808)764-5259 ? [employer], Disability and Transit Services: Assists with nutrition, care and transit needs. [place] Support Programs: @10RELATIVEDAYS @ > Cancer Support Group  2nd Tuesday of the month 1pm-2pm, Journey Room  > Creative Journey  3rd Tuesday of the month 1130am-1pm, Journey Room  > Look Good Feel Better  1st Wednesday of the month 10am-12 noon, Journey Room (Call [place] to register [phone])

## 2016-02-29 NOTE — Progress Notes (Signed)
Randall Reyes presents today for injection per MD orders. B12 1071mcg administered IM in left Upper Arm. Flu vaccination administered IM in right upper arm. Administrations without incident. Patient tolerated well. Randall Reyes presented for Portacath access and flush. Proper placement of portacath confirmed by CXR. Portacath located right chest wall accessed with  H 20 needle. No blood return, but flushes well without any discomfort or difficulty. Portacath flushed with 21ml NS and 500U/52ml Heparin and needle removed intact. Procedure without incident. Patient tolerated procedure well.

## 2016-02-29 NOTE — Progress Notes (Signed)
See other encounter.

## 2016-02-29 NOTE — Assessment & Plan Note (Signed)
B12 deficiency with negative intrinsic factor antibody and anti-parietal cell antibody on 10/18/2014.  Now on monthly B12 IM replacement.  B12 injection today.  Supportive therapy plan reviewed.

## 2016-02-29 NOTE — Patient Instructions (Signed)
[place] at [facility] Discharge Instructions  RECOMMENDATIONS MADE BY THE CONSULTANT AND ANY TEST RESULTS WILL BE SENT TO YOUR REFERRING PHYSICIAN.  Port flush and B12 injection today.    Thank you for choosing [place] at [facility] to provide your oncology and hematology care.  To afford each patient quality time with our provider, please arrive at least 15 minutes before your scheduled appointment time.   Beginning January 23rd 2017 lab work for the [facility] will be done in the  Main lab at [facility] on 1st floor. If you have a lab appointment with the [facility] please come in thru the  Main Entrance and check in at the main information desk  You need to re-schedule your appointment should you arrive 10 or more minutes late.  We strive to give you quality time with our providers, and arriving late affects you and other patients whose appointments are after yours.  Also, if you no show three or more times for appointments you may be dismissed from the clinic at the providers discretion.     Again, thank you for choosing [facility].  Our hope is that these requests will decrease the amount of time that you wait before being seen by our physicians.       _____________________________________________________________  Should you have questions after your visit to [facility], please contact our office at [phone] between the hours of 8:30 a.m. and 4:30 p.m.  Voicemails left after 4:30 p.m. will not be returned until the following business day.  For prescription refill requests, have your pharmacy contact our office.         Resources For Cancer Patients and their Caregivers ? American Cancer Society: Can assist with transportation, wigs, general needs, runs Look Good Feel Better.        475-197-8712 ? Cancer Care: Provides financial assistance, online support groups, medication/co-pay  assistance.  1-800-813-HOPE 737-530-7412) ? [place] Assists [place] cancer patients and their families through emotional , educational and financial support.  (478)851-5647 ? [place] DSS Where to apply for food stamps, Medicaid and utility assistance. (682) 105-5039 ? RCATS: Transportation to medical appointments. (810) 307-2816 ? Social Security Administration: May apply for disability if have a Stage IV cancer. 646-248-4495 351 128 7314 ? [employer], Disability and Transit Services: Assists with nutrition, care and transit needs. [place] Support Programs: @10RELATIVEDAYS @ > Cancer Support Group  2nd Tuesday of the month 1pm-2pm, Journey Room  > Creative Journey  3rd Tuesday of the month 1130am-1pm, Journey Room  > Look Good Feel Better  1st Wednesday of the month 10am-12 noon, Journey Room (Call [place] to register [phone])

## 2016-02-29 NOTE — Progress Notes (Signed)
CRITICAL VALUE ALERT Critical value received:  WBC 107.6 Date of notification:  02/29/2016 Time of notification: 1000 Critical value read back:  Yes.   Nurse who received alert:  Josephina Shih, RN MD notified (1st page):  Robynn Pane, PA notified in person, no additional instruction

## 2016-02-29 NOTE — Progress Notes (Signed)
[name], MD [address] [place] [address]  Preventative health care  CLL (chronic lymphocytic leukemia) [facility]) - Plan: CBC with Differential, Comprehensive metabolic panel, Lactate dehydrogenase  B12 deficiency  CURRENT THERAPY: Observation  INTERVAL HISTORY: Randall Reyes 80 y.o. male returns for followup of CLL. AND B12 deficiency    CLL (chronic lymphocytic leukemia) ([place])   02/14/2005 Procedure    Peripheral flow cytometry- CLL (without 11q or 17 p deletion) presenting with stage 0 disease      10/25/2008 Progression    Increasing WBC and symptoms suggestive of progressive disease      11/09/2008 - 01/07/2009 Chemotherapy    Rituxan, fludarabine, cytoxan x 3 cycles with the discontinuation of fludarabine and cytoxan due to severe pancytopenia requiring hospitalization.      01/10/2009 Adverse Reaction    Severe pancytopenia and adverse side effects      02/01/2009 - 04/01/2009 Chemotherapy    Rituxan every 21 days x 3 cycles      04/02/2009 Remission    Stable disease.  Chemotherapy holiday started      06/22/2013 - 06/29/2013 Chemotherapy    Imbruvica daily      06/29/2013 Adverse Reaction    Large right posterior leg (popliteal) ecchymosis/violaceous rash.  Likely Imbruvica-induced      07/31/2013 - 10/12/2013 Chemotherapy    Zydelig (idelalisib) 150 mg BID monotherapy      10/12/2013 Adverse Reaction    Transaminitis, suspected to be secondary to [place].  Dose decreased.      10/12/2013 - 11/09/2013 Chemotherapy    Zydelig (idelalisib) 150 mg daily.      11/09/2013 Adverse Reaction    Worsening transaminitis.  Zydelig on HOLD      11/09/2013 Treatment Plan Change    Hold Zydelig      12/28/2013 Adverse Reaction    Herpes Zoster outbreak.  Partial response with Famvir x 1 week, switched to acyclovir.  Progression over 1-2 weeks with development of keratoconjunctivitis bilaterally.  VZ Immune Globulin 625 mg IM given on  01/13/14.      02/08/2014 Treatment Plan Change    Ibrutinib restarted 140 mg daily for one week followed by 280 mg daily followed by 420 mg daily with prophylactic acyclovir.      02/08/2014 - 07/05/2014 Chemotherapy    Ibrutinib 420 mg daily      123-Feb-202015 - 04/17/2014 Hospital Admission    Right lower lobe pneumonia      07/05/2014 Adverse Reaction    Fissuring of fingertips and sore, red feet with transaminitis      07/05/2014 Treatment Plan Change    Hold Ibrutinib      07/22/2014 - 08/19/2014 Chemotherapy    Ibrutinib restarted at 280 mg daily.      08/09/2014 - 08/12/2014 Hospital Admission    Bilateral pneumonia      08/19/2014 Adverse Reaction    Nausea with vomiting.  Escorted to ED.      08/19/2014 Treatment Plan Change    Ibrutinib on hold- ED escort from clinic       10/18/2014 Miscellaneous    B12 weekly x 4 then monthly.      12/15/2015 Imaging    Bone density- BMD as determined from Femur Neck Left is 0.803 g/cm2 with a T-score of -2.1. This patient is considered osteopenic by World Healh Organization (WHO) Criteria.      12/16/2015 Imaging    CT abd/pelvis-  Progression of  retroperitoneal and portacaval adenopathy compared to the prior study. Likely related to CLL.  Small hiatal hernia  Mild compression fractures T11 and T12, probably old. Correlate with any pain in this area.       He denies any B symptoms, severe fatigue, new palpable lumps/bumps, change in appetite, and fevers.  He reports that his appetite is strong.  His wife confirms that he eats well and he remains very active.  He continues on monthly day trips with his church family.  He denies any new medications.  Review of Systems  Constitutional: Negative for chills, fever, malaise/fatigue and weight loss.  HENT: Negative.  Negative for congestion.   Eyes: Negative.   Respiratory: Negative.  Negative for cough and sputum production.   Cardiovascular: Negative.  Negative for chest pain.   Gastrointestinal: Negative.  Negative for constipation, diarrhea, nausea and vomiting.  Genitourinary: Negative.   Musculoskeletal: Negative.  Negative for myalgias.  Skin: Negative.  Negative for rash.  Neurological: Negative.  Negative for weakness.  Endo/Heme/Allergies: Negative.   Psychiatric/Behavioral: Negative.     Past Medical History:  Diagnosis Date  . Anemia   . B12 deficiency 12/07/2014  . CLL (chronic lymphocytic leukemia) ([place]) 01/24/2011  . Diabetes mellitus   . DM (diabetes mellitus) ([place]) 01/24/2011  . Eczema 01/24/2011  . History of pneumonia 04/2014  . Hypertension   . Leukemia ([place]) 6.22.2012   PER PATIENT  . Melanoma in situ ([place]) 01/24/2011  . Mild obesity 01/24/2011  . Port catheter in place 05/02/2012  . Shingles   . Ulcer [facility])     Past Surgical History:  Procedure Laterality Date  . bleeding ulcer    . CATARACT EXTRACTION W/PHACO  05/05/2012   Procedure: CATARACT EXTRACTION PHACO AND INTRAOCULAR LENS PLACEMENT (IOC);  Surgeon: Tonny Branch, MD;  Location: [facility] ORS;  Service: Ophthalmology;  Laterality: Right;  CDE:13.25  . CATARACT EXTRACTION W/PHACO  05/15/2012   Procedure: CATARACT EXTRACTION PHACO AND INTRAOCULAR LENS PLACEMENT (IOC);  Surgeon: Tonny Branch, MD;  Location: [facility] ORS;  Service: Ophthalmology;  Laterality: Left;  CDE:  12.32  . COLONOSCOPY N/A 01/30/2016   Procedure: COLONOSCOPY;  Surgeon: Danie Binder, MD;  Location: [facility] ENDO SUITE;  Service: Endoscopy;  Laterality: N/A;  2:00 PM - moved to 1:00 - office notified pt  . ESOPHAGOGASTRODUODENOSCOPY  2008   Dr. Oneida Alar: normal esophagus, antral erythema, 1 cm clean based duodenal ulcera, negative H. plyori  . ESOPHAGOGASTRODUODENOSCOPY N/A 01/30/2016   Procedure: ESOPHAGOGASTRODUODENOSCOPY (EGD);  Surgeon: Danie Binder, MD;  Location: [facility] ENDO SUITE;  Service: Endoscopy;  Laterality: N/A;  . HERNIA REPAIR  2001  . PORTACATH PLACEMENT  2008    Family History  Problem Relation Age of Onset  .  Diabetes Father   . Colon cancer Neg Hx     Social History   Social History  . Marital status: Married    Spouse name: N/A  . Number of children: N/A  . Years of education: N/A   Social History Main Topics  . Smoking status: Former Research scientist (life sciences)  . Smokeless tobacco: Former Systems developer    Quit date: 04/25/1967  . Alcohol use No  . Drug use: No  . Sexual activity: Not Asked   Other Topics Concern  . None   Social History Narrative  . None     PHYSICAL EXAMINATION  ECOG PERFORMANCE STATUS: 0 - Asymptomatic  There were no vitals filed for this visit.  Vitals - 1 value per visit 01/30/2016  SYSTOLIC 92  DIASTOLIC 69  Pulse 66  Temperature 97.5  Respirations 12  Weight (lb) 137    GENERAL:alert, healthy, no distress, well nourished, well developed, comfortable, cooperative, smiling and accompanied by his wife SKIN: skin color, texture, turgor are normal, no rashes or significant lesions HEAD: Normocephalic, No masses, lesions, tenderness or abnormalities EYES: normal, EOMI, Conjunctiva are pink and non-injected EARS: External ears normal OROPHARYNX: buccal mucosa, and tongue normal and mucous membranes are moist, lower right lip lesion NECK: supple, no adenopathy, trachea midline LYMPH:  no palpable lymphadenopathy, no hepatosplenomegaly BREAST:not examined LUNGS: clear to auscultation and percussion HEART: regular rate & rhythm, no murmurs, no gallops, S1 normal and S2 normal ABDOMEN:abdomen soft, non-tender, normal bowel sounds and no masses or organomegaly BACK: Back symmetric, no curvature., No CVA tenderness EXTREMITIES:less then 2 second capillary refill, no joint deformities, effusion, or inflammation, no skin discoloration, no cyanosis  NEURO: alert & oriented x 3 with fluent speech, no focal motor/sensory deficits, gait normal  LABORATORY DATA: CBC    Component Value Date/Time   WBC 107.6 (HH) 02/29/2016 0901   RBC 3.31 (L) 02/29/2016 0901   HGB 11.7 (L) 02/29/2016  0901   HCT 36.1 (L) 02/29/2016 0901   PLT 182 02/29/2016 0901   MCV 109.1 (H) 02/29/2016 0901   MCH 35.3 (H) 02/29/2016 0901   MCHC 32.4 02/29/2016 0901   RDW 14.9 02/29/2016 0901   LYMPHSABS 95.8 (H) 02/29/2016 0901   MONOABS 3.2 (H) 02/29/2016 0901   EOSABS 1.1 (H) 02/29/2016 0901   BASOSABS 0.0 02/29/2016 0901      Chemistry      Component Value Date/Time   NA 139 02/29/2016 0901   K 5.1 02/29/2016 0901   CL 109 02/29/2016 0901   CO2 24 02/29/2016 0901   BUN 23 (H) 02/29/2016 0901   CREATININE 2.15 (H) 02/29/2016 0901      Component Value Date/Time   CALCIUM 8.9 02/29/2016 0901   CALCIUM 8.3 (L) 02/03/2015 0639   ALKPHOS 84 02/29/2016 0901   AST 23 02/29/2016 0901   ALT 18 02/29/2016 0901   BILITOT 0.5 02/29/2016 0901        PENDING LABS:   RADIOGRAPHIC STUDIES:  No results found.   PATHOLOGY:    ASSESSMENT AND PLAN:  CLL (chronic lymphocytic leukemia) ([place]) CLL, diagnosed in 2006 and per records was treated with FCR in 2010 secondary to a rapid doubling time of his wbc count up to 290K with diffuse LAD, hypogammaglobulinemia. He only received 3 cycles of FCR secondary to prolonged pancytopenia.  In Jan 2015, he was started on Imbruvica for a rising wbc and increased fatigue; other counts were relatively preserved. WBC count at that time was 36K. Therapy was interrupted secondary to rash. He was then started on Idelalisib on 07/31/2013. This therapy was held secondary to severe zoster outbreak diagnosed on 12/28/2013. Patient was given Varicella Zoster Immune Globulin IM on 01/13/2014. He was then restarted on Imbruvica on 02/08/2014. He was dose reduced secondary to abnormal LFT's and now therapy has been discontinued secondary to pneumonia and generalized decline. He is currently doing better off therapy.  Labs today: CBC diff, CMET, LDH.  I personally reviewed and went over laboratory results with the patient.  The results are noted within this dictation.   Leukocytosis is noted > 100.  Mild anemia is noted.  Both of these are new or progressive.  Platelet count is WNL.    He denies any B symptoms.  He  denies any new palpable adenopathy.  Weight is stable.  He denies fatigue.  He denies any fevers.  Primary care provider ordered a bone density exam and this was completed on 12/15/2014 demonstrating osteoporosis.  Additionally, CT abd/pelvis was performed on 12/16/2015 demonstrating progression of intraabdominal adenopathy, mild.  I personally reviewed and went over radiographic studies with the patient.  The results are noted within this dictation.    Labs in 8 weeks: CBC diff, CMET, LDH  He continues to follow with Fran Lowes, MD (Nephrology).  He is no longer taking kayexalate and has follow-up on 03/26/2016 with Dr. Lowanda Foster.  Return in 8 weeks for follow-up.   Due to lab changes, will increase frequency of lab appointments and follow-up appointments for the time being.   B12 deficiency B12 deficiency with negative intrinsic factor antibody and anti-parietal cell antibody on 10/18/2014.  Now on monthly B12 IM replacement.  B12 injection today.  Supportive therapy plan reviewed.   ORDERS PLACED FOR THIS ENCOUNTER: Orders Placed This Encounter  Procedures  . CBC with Differential  . Comprehensive metabolic panel  . Lactate dehydrogenase    MEDICATIONS PRESCRIBED THIS ENCOUNTER: Meds ordered this encounter  Medications  . Influenza vac split quadrivalent PF (FLUARIX) injection 0.5 mL  . fluticasone (FLONASE) 50 MCG/ACT nasal spray  . ACCU-CHEK SOFTCLIX LANCETS lancets  . DISCONTD: SUPREP BOWEL PREP KIT 17.5-3.13-1.6 GM/180ML SOLN  . DISCONTD: predniSONE (STERAPRED UNI-PAK 21 TAB) 10 MG (21) TBPK tablet  . rosuvastatin (CRESTOR) 40 MG tablet  . SPS 15 GM/60ML suspension    THERAPY PLAN:  Continue observation.  NCCN guidelines for indication for treatment of CLL are:  A. Eligible for clinical trial  B. Significant  disease-related symptoms   1. Fatigue (severe)   2. Night sweats   3. Weight loss   4. Fever without infection  C. Threatened end-organ function  D. Progressive bulky disease (spleen >6cm below costal margin, lymph nodes >10 cm)  E. Progressive anemia  F. Progressive thrombocytopenia.   All questions were answered. The patient knows to call the clinic with any problems, questions or concerns. We can certainly see the patient much sooner if necessary.  Patient and plan discussed with Dr. Ancil Linsey and she is in agreement with the aforementioned.   This note is electronically signed by: Robynn Pane, PA-C 02/29/2016 10:12 AM

## 2016-02-29 NOTE — Assessment & Plan Note (Addendum)
CLL, diagnosed in 2006 and per records was treated with FCR in 2010 secondary to a rapid doubling time of his wbc count up to 290K with diffuse LAD, hypogammaglobulinemia. He only received 3 cycles of FCR secondary to prolonged pancytopenia.  In Jan 2015, he was started on Imbruvica for a rising wbc and increased fatigue; other counts were relatively preserved. WBC count at that time was 36K. Therapy was interrupted secondary to rash. He was then started on Idelalisib on 07/31/2013. This therapy was held secondary to severe zoster outbreak diagnosed on 12/28/2013. Patient was given Varicella Zoster Immune Globulin IM on 01/13/2014. He was then restarted on Imbruvica on 02/08/2014. He was dose reduced secondary to abnormal LFT's and now therapy has been discontinued secondary to pneumonia and generalized decline. He is currently doing better off therapy.  Labs today: CBC diff, CMET, LDH.  I personally reviewed and went over laboratory results with the patient.  The results are noted within this dictation.  Leukocytosis is noted > 100.  Mild anemia is noted.  Both of these are new or progressive.  Platelet count is WNL.    He denies any B symptoms.  He denies any new palpable adenopathy.  Weight is stable.  He denies fatigue.  He denies any fevers.  Primary care provider ordered a bone density exam and this was completed on 12/15/2014 demonstrating osteoporosis.  Additionally, CT abd/pelvis was performed on 12/16/2015 demonstrating progression of intraabdominal adenopathy, mild.  I personally reviewed and went over radiographic studies with the patient.  The results are noted within this dictation.    Labs in 8 weeks: CBC diff, CMET, LDH  He continues to follow with Fran Lowes, MD (Nephrology).  He is no longer taking kayexalate and has follow-up on 03/26/2016 with Dr. Lowanda Foster.  Return in 8 weeks for follow-up.   Due to lab changes, will increase frequency of lab appointments and follow-up appointments for  the time being.

## 2016-03-28 ENCOUNTER — Encounter (HOSPITAL_COMMUNITY): Payer: Medicare Other | Attending: Oncology

## 2016-03-28 ENCOUNTER — Telehealth (HOSPITAL_COMMUNITY): Payer: Self-pay | Admitting: *Deleted

## 2016-03-28 VITALS — BP 120/59 | HR 53 | Temp 97.6°F | Resp 18

## 2016-03-28 DIAGNOSIS — C9110 Chronic lymphocytic leukemia of B-cell type not having achieved remission: Secondary | ICD-10-CM | POA: Insufficient documentation

## 2016-03-28 DIAGNOSIS — M310 Hypersensitivity angiitis: Secondary | ICD-10-CM | POA: Insufficient documentation

## 2016-03-28 DIAGNOSIS — E538 Deficiency of other specified B group vitamins: Secondary | ICD-10-CM

## 2016-03-28 MED ORDER — CYANOCOBALAMIN 1000 MCG/ML IJ SOLN
1000.0000 ug | Freq: Once | INTRAMUSCULAR | Status: AC
  Administered 2016-03-28: 1000 ug via INTRAMUSCULAR

## 2016-03-28 MED ORDER — CYANOCOBALAMIN 1000 MCG/ML IJ SOLN
INTRAMUSCULAR | Status: AC
  Filled 2016-03-28: qty 1

## 2016-03-28 NOTE — Patient Instructions (Signed)
[facility] at [facility] Discharge Instructions  RECOMMENDATIONS MADE BY THE CONSULTANT AND ANY TEST RESULTS WILL BE SENT TO YOUR REFERRING PHYSICIAN.  Received Vit B12 injection today. Follow-up as scheduled. Call clinic for any questions or concerns  Thank you for choosing [facility] at [facility] to provide your oncology and hematology care.  To afford each patient quality time with our provider, please arrive at least 15 minutes before your scheduled appointment time.   Beginning January 23rd 2017 lab work for the Cancer Center will be done in the  Main lab at [place] on 1st floor. If you have a lab appointment with the Cancer Center please come in thru the  Main Entrance and check in at the main information desk  You need to re-schedule your appointment should you arrive 10 or more minutes late.  We strive to give you quality time with our providers, and arriving late affects you and other patients whose appointments are after yours.  Also, if you no show three or more times for appointments you may be dismissed from the clinic at the providers discretion.     Again, thank you for choosing [facility].  Our hope is that these requests will decrease the amount of time that you wait before being seen by our physicians.       _____________________________________________________________  Should you have questions after your visit to [facility], please contact our office at [phone] between the hours of 8:30 a.m. and 4:30 p.m.  Voicemails left after 4:30 p.m. will not be returned until the following business day.  For prescription refill requests, have your pharmacy contact our office.         Resources For Cancer Patients and their Caregivers ? American Cancer Society: Can assist with transportation, wigs, general needs, runs Look Good Feel Better.        1-888-227-6333 ? Cancer Care: Provides  financial assistance, online support groups, medication/co-pay assistance.  1-800-813-HOPE (4673) ? [facility] Assists [place] cancer patients and their families through emotional , educational and financial support.  336-427-4357 ? [place] DSS Where to apply for food stamps, Medicaid and utility assistance. 336-342-1394 ? [employer]: Transportation to medical appointments. 336-347-2287 ? Social Security Administration: May apply for disability if have a Stage IV cancer. 336-342-7796 1-800-772-1213 ? [place] Aging, Disability and Transit Services: Assists with nutrition, care and transit needs. 336-349-2343  Cancer Center Support Programs: @10RELATIVEDAYS@ > Cancer Support Group  2nd Tuesday of the month 1pm-2pm, Journey Room  > Creative Journey  3rd Tuesday of the month 1130am-1pm, Journey Room  > Look Good Feel Better  1st Wednesday of the month 10am-12 noon, Journey Room (Call [employer] to register 1-800-395-5775)   

## 2016-03-28 NOTE — Progress Notes (Signed)
Randall Reyes tolerated Vit B12 injection well without complaints or incident. VSS Pt discharged self ambulatory in satisfactory condition with family member

## 2016-04-17 NOTE — Progress Notes (Signed)
SEPT 2017:   Iron 140, ferritin 212, Hgb 11.9. Colonoscopy completed.

## 2016-04-25 ENCOUNTER — Ambulatory Visit (HOSPITAL_COMMUNITY): Payer: Medicare Other

## 2016-04-25 ENCOUNTER — Other Ambulatory Visit (HOSPITAL_COMMUNITY): Payer: Medicare Other

## 2016-04-25 ENCOUNTER — Ambulatory Visit (HOSPITAL_COMMUNITY): Payer: Medicare Other | Admitting: Oncology

## 2016-05-09 ENCOUNTER — Encounter (HOSPITAL_COMMUNITY): Payer: Medicare Other

## 2016-05-09 ENCOUNTER — Encounter (HOSPITAL_COMMUNITY): Payer: Medicare Other | Attending: Oncology | Admitting: Oncology

## 2016-05-09 ENCOUNTER — Telehealth (HOSPITAL_COMMUNITY): Payer: Self-pay | Admitting: Emergency Medicine

## 2016-05-09 ENCOUNTER — Encounter (HOSPITAL_BASED_OUTPATIENT_CLINIC_OR_DEPARTMENT_OTHER): Payer: Medicare Other

## 2016-05-09 ENCOUNTER — Encounter (HOSPITAL_COMMUNITY): Payer: Self-pay | Admitting: Oncology

## 2016-05-09 VITALS — BP 125/68 | HR 58 | Temp 97.9°F | Resp 16 | Wt 137.2 lb

## 2016-05-09 DIAGNOSIS — J00 Acute nasopharyngitis [common cold]: Secondary | ICD-10-CM

## 2016-05-09 DIAGNOSIS — E538 Deficiency of other specified B group vitamins: Secondary | ICD-10-CM

## 2016-05-09 DIAGNOSIS — R05 Cough: Secondary | ICD-10-CM

## 2016-05-09 DIAGNOSIS — C9110 Chronic lymphocytic leukemia of B-cell type not having achieved remission: Secondary | ICD-10-CM

## 2016-05-09 DIAGNOSIS — R059 Cough, unspecified: Secondary | ICD-10-CM

## 2016-05-09 DIAGNOSIS — D539 Nutritional anemia, unspecified: Secondary | ICD-10-CM | POA: Diagnosis not present

## 2016-05-09 DIAGNOSIS — Z95828 Presence of other vascular implants and grafts: Secondary | ICD-10-CM

## 2016-05-09 LAB — COMPREHENSIVE METABOLIC PANEL
ALBUMIN: 3.8 g/dL (ref 3.5–5.0)
ALK PHOS: 93 U/L (ref 38–126)
ALT: 13 U/L — AB (ref 17–63)
AST: 20 U/L (ref 15–41)
Anion gap: 7 (ref 5–15)
BILIRUBIN TOTAL: 0.5 mg/dL (ref 0.3–1.2)
BUN: 21 mg/dL — AB (ref 6–20)
CALCIUM: 8.9 mg/dL (ref 8.9–10.3)
CO2: 26 mmol/L (ref 22–32)
CREATININE: 2.1 mg/dL — AB (ref 0.61–1.24)
Chloride: 105 mmol/L (ref 101–111)
GFR calc Af Amer: 33 mL/min — ABNORMAL LOW (ref >60)
GFR calc non Af Amer: 29 mL/min — ABNORMAL LOW (ref >60)
GLUCOSE: 109 mg/dL — AB (ref 65–99)
POTASSIUM: 4.7 mmol/L (ref 3.5–5.1)
Sodium: 138 mmol/L (ref 135–145)
TOTAL PROTEIN: 7.4 g/dL (ref 6.5–8.1)

## 2016-05-09 LAB — CBC WITH DIFFERENTIAL/PLATELET
EOS PCT: 3 %
HEMATOCRIT: 30.3 % — AB (ref 39.0–52.0)
HEMOGLOBIN: 9.8 g/dL — AB (ref 13.0–17.0)
Lymphocytes Relative: 97 %
MCH: 36.6 pg — ABNORMAL HIGH (ref 26.0–34.0)
MCHC: 32.3 g/dL (ref 30.0–36.0)
MCV: 113.1 fL — AB (ref 78.0–100.0)
NRBC: 0 /100{WBCs}
Neutrophils Relative %: 3 %
Platelets: 172 10*3/uL (ref 150–400)
RBC: 2.68 MIL/uL — ABNORMAL LOW (ref 4.22–5.81)
RDW: 15.8 % — ABNORMAL HIGH (ref 11.5–15.5)
WBC: 212.4 10*3/uL (ref 4.0–10.5)

## 2016-05-09 LAB — FERRITIN: Ferritin: 128 ng/mL (ref 24–336)

## 2016-05-09 LAB — IRON AND TIBC
Iron: 140 ug/dL (ref 45–182)
SATURATION RATIOS: 41 % — AB (ref 17.9–39.5)
TIBC: 340 ug/dL (ref 250–450)
UIBC: 200 ug/dL

## 2016-05-09 LAB — URIC ACID: Uric Acid, Serum: 5.6 mg/dL (ref 4.4–7.6)

## 2016-05-09 LAB — VITAMIN B12

## 2016-05-09 LAB — FOLATE: FOLATE: 14.1 ng/mL (ref >5.9)

## 2016-05-09 LAB — LACTATE DEHYDROGENASE: LDH: 230 U/L — ABNORMAL HIGH (ref 98–192)

## 2016-05-09 MED ORDER — SODIUM CHLORIDE 0.9% FLUSH
10.0000 mL | INTRAVENOUS | Status: DC | PRN
  Administered 2016-05-09: 10 mL via INTRAVENOUS
  Filled 2016-05-09: qty 10

## 2016-05-09 MED ORDER — CYANOCOBALAMIN 1000 MCG/ML IJ SOLN
1000.0000 ug | Freq: Once | INTRAMUSCULAR | Status: AC
  Administered 2016-05-09: 1000 ug via INTRAMUSCULAR

## 2016-05-09 MED ORDER — ALLOPURINOL 100 MG PO TABS: 100.0000 mg | ORAL_TABLET | Freq: Three times a day (TID) | ORAL | 1 refills | Status: DC

## 2016-05-09 MED ORDER — AZITHROMYCIN 250 MG PO TABS: ORAL_TABLET | ORAL | 0 refills | Status: DC

## 2016-05-09 MED ORDER — HEPARIN SOD (PORK) LOCK FLUSH 100 UNIT/ML IV SOLN
500.0000 [IU] | Freq: Once | INTRAVENOUS | Status: AC
  Administered 2016-05-09: 500 [IU] via INTRAVENOUS

## 2016-05-09 NOTE — Progress Notes (Signed)
Randall Reyes presents today for injection per the provider's orders.  B12 administration without incident; see MAR for injection details.  Patient tolerated procedure well and without incident.  No questions or complaints noted at this time.  [name] presented for Portacath access and flush. Portacath located right chest wall accessed with  H 20 needle.  No blood return and flushes w/o difficulty.  No s/s infiltration at site. Portacath flushed with 26ml NS and 500U/27ml Heparin and needle removed intact.  Procedure tolerated well and without incident.

## 2016-05-09 NOTE — Patient Instructions (Addendum)
[place] at [facility] Discharge Instructions  RECOMMENDATIONS MADE BY THE CONSULTANT AND ANY TEST RESULTS WILL BE SENT TO YOUR REFERRING PHYSICIAN.You saw Kirby Crigler, PA-C, today. More labs today on your way out. Prescriptions for z-pak and allopurinol given. Anderson Malta will contact you regarding chemo teaching. Start chemotherapy next week  See Amy at checkout for appointments.    Thank you for choosing [place] at [facility] to provide your oncology and hematology care.  To afford each patient quality time with our provider, please arrive at least 15 minutes before your scheduled appointment time.   Beginning January 23rd 2017 lab work for the [facility] will be done in the  Main lab at [facility] on 1st floor. If you have a lab appointment with the [facility] please come in thru the  Main Entrance and check in at the main information desk  You need to re-schedule your appointment should you arrive 10 or more minutes late.  We strive to give you quality time with our providers, and arriving late affects you and other patients whose appointments are after yours.  Also, if you no show three or more times for appointments you may be dismissed from the clinic at the providers discretion.     Again, thank you for choosing [facility].  Our hope is that these requests will decrease the amount of time that you wait before being seen by our physicians.       _____________________________________________________________  Should you have questions after your visit to [facility], please contact our office at [phone] between the hours of 8:30 a.m. and 4:30 p.m.  Voicemails left after 4:30 p.m. will not be returned until the following business day.  For prescription refill requests, have your pharmacy contact our office.         Resources For Cancer Patients and their Caregivers ? American Cancer  Society: Can assist with transportation, wigs, general needs, runs Look Good Feel Better.        267-003-7989 ? Cancer Care: Provides financial assistance, online support groups, medication/co-pay assistance.  1-800-813-HOPE (360) 329-8863) ? [facility] Assists [place] cancer patients and their families through emotional , educational and financial support.  817-055-7006 ? [place] DSS Where to apply for food stamps, Medicaid and utility assistance. 778 202 9607 ? RCATS: Transportation to medical appointments. (602)628-1557 ? Social Security Administration: May apply for disability if have a Stage IV cancer. (785)231-1720 305-290-3427 ? [employer], Disability and Transit Services: Assists with nutrition, care and transit needs. [place] Support Programs: @10RELATIVEDAYS @ > Cancer Support Group  2nd Tuesday of the month 1pm-2pm, Journey Room  > Creative Journey  3rd Tuesday of the month 1130am-1pm, Journey Room  > Look Good Feel Better  1st Wednesday of the month 10am-12 noon, Journey Room (Call [place] to register [phone])

## 2016-05-09 NOTE — Telephone Encounter (Signed)
Called to let pts wife know that chemo teaching was set up for 05/16/2016 at 11:30 am.  Chemo to start 05/17/2016 at 8:45 am.  pts wife has a colonoscopy scheduled for that Friday, she is going to see if she can get someone to bring the pt if not we will move the schedule to 1/8, 1/9.

## 2016-05-09 NOTE — Progress Notes (Signed)
CRITICAL VALUE ALERT Critical value received:  WBC 212,000 Date of notification:  05/09/2016 Time of notification: [id number] Critical value read back:  Yes.   Nurse who received alert:  C.Page rn MD notified (1st page):  [phone]

## 2016-05-09 NOTE — Patient Instructions (Addendum)
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  We are going to treat you with rituxan and bendamustine.  Rituxan is given day 1 every 28 days and Bendamustine is given day 1 and day 2 every 28 days.   You will see the doctor regularly throughout treatment.  We monitor your lab work prior to every treatment.  The doctor monitors your response to treatment by the way you are feeling, your blood work, and scans periodically.  You will receive the following premedications prior to each chemotherapy treatment: Tylenol and Benadryl:  Help prevent any reaction to the chemotherapy.  Premeds: Aloxi - high powered nausea/vomiting prevention medication used for chemotherapy patients. Dexamethasone - steroid - given to reduce the risk of you having an allergic type reaction to the chemotherapy. Dex can cause you to feel energized, nervous/anxious/jittery, make you have trouble sleeping, and/or make you feel hot/flushed in the face/neck and/or look pink/red in the face/neck. These side effects will pass as the Dex wears off. (takes 20 minutes to infuse)  POTENTIAL SIDE EFFECTS OF TREATMENT:  Rituximab (Generic Name) Other Name: Rituxan  About This Drug Rituximab is a monoclonal antibody used to treat cancer. This drug is given in the vein (IV).  Possible Side Effects (More Common) . Bone marrow depression. This is a decrease in the number of white blood cells, red blood cells, and platelets. This may raise your risk of infection, make you tired and weak (fatigue), and raise your risk of bleeding. . Rash-skin irritation, redness or itching (dermatitis) . Flu-like symptoms: fever, headache, muscle and joint aches, and fatigue (low energy, feeling weak) . Infusion-related reactions . Hepatitis B - if you have ever had hepatitis B, the virus may come back during treatment with this drug. Your doctor will test to see if you have ever had hepatitis B prior to your  treatment. . Changes in your central nervous system can happen. The central nervous system is made up of your brain and spinal cord. You could feel: extreme tiredness, agitation, confusion, or have: hallucinations (see or hear things that are not there), trouble understanding or speaking, loss of control of your bowels or bladder, eyesight changes, numbness or lack of strength to your arms, legs, face, or body, seizures or coma. If you start to have any of these symptoms let your doctor know right away. . Tumor lysis: This drug may act on the cancer cells very quickly. This may affect how your kidneys work. Your doctor will monitor your kidney function. . Changes in your liver function. Your doctor will check your liver function as needed. . Nausea and throwing up (vomiting): these symptoms may happen within a few hours after your treatment and may last up to 24 hours. Medicines are available to stop or lessen these side effects. . Loose bowel movements (diarrhea) that may last for a few days . Abdominal pain . Infections . Cough, runny nose . Swelling of your legs, ankles and/or feet or hands . High blood pressure Your doctor will check your blood pressure as needed. . Abnormal heart beat  Possible Side Effects (Less Common) . Shortness of breath . Soreness of the mouth and throat. You may have red areas, white patches, or sores that hurt.  Infusion Reactions Infusion Reactions are the most common side effect linked to use of this drug and can be quite severe. Medicines will be given before you get the drug to lower the severity of this  side effect. The infusion reactions are the worse with the first dose of the drug and become less severe with more doses of the drug. While you are getting this drug in your vein (IV), tell your nurse right away if you have any of these symptoms of an allergic reaction: . Trouble catching your breath . Feeling like your tongue or throat are swelling . Feeling  your heart beat quickly or in a not normal way (palpitations) . Feeling dizzy or lightheaded . Flushing, itching, rash, and/or hives  Treating Side Effects . Ask your doctor or nurse about medicine to stop or lessen headache, loose bowel movements (diarrhea), constipation, nausea, throwing up (vomiting), or pain. . If you get a rash do not put anything it unless your doctor or nurse says you may. Keep the area around the rash clean and dry. Ask your doctor for medicine if the rash bothers you. . Drink 6-8 cups of fluids each day unless your doctor has told you to limit your fluid intake due to some other health problem. A cup is 8 ounces of fluid. If you throw up or have loose bowel movements, you should drink more fluids so that you do not become dehydrated (lack of water in the body from losing too much fluid). . If you are not able to move your bowels, check with your doctor or nurse before you use enemas, laxatives, or suppositories . If you have mouth sores, avoid mouthwash that has alcohol. Also avoid alcohol and smoking because they can bother your mouth and throat. . If you have a nose bleed, sit with your head tipped slightly forward. Apply pressure by lightly pinching the bridge of your nose between your thumb and forefinger. Call your doctor if you feel dizzy or faint or if the bleeding doesn't stop after 10 to 15 minutes  Important Information . After treatment with this drug, vaccination with live viruses should be delayed until the immune system recovers. . Symptoms of abnormal bleeding may be: coughing up blood, throwing up blood (may look like coffee grounds), red or black, tarry bowel movements, blood in urine, abnormally heavy menstrual flow, nosebleeds, or any unusual bleeding. . Symptoms of high blood pressure may be: headache, blurred vision, confusion, chest pain, or a feeling that your heart is beat differently. [name] Urinary tract infection. Symptoms may include: . Pain or  burning when you pass urine . Feeling like you have to pass urine often, but not much comes out when you do. . Tender or heavy feeling in your lower abdomen . Cloudy urine and/or urine that smells bad. . Pain on one side of your back under your ribs. This is where your kidneys are. . Fever, chills, nausea and/or throwing up  Food and Drug Interactions There are no known interactions of rituximab and any food. This drug may interact with other medicines. Tell your doctor and pharmacist about all the medicines and dietary supplements (vitamins, minerals, herbs and others) that you are taking at this time. The safety and use of dietary supplements and alternative diets are often not known. Using these might affect your cancer or interfere with your treatment. Until more is known, you should not use dietary supplements or alternative diets without your cancer doctor's help.  When to Call the Doctor Call your doctor or nurse right away if you have any of these symptoms: . Fever of 100.5 F (38 C) higher . Chills . Trouble breathing . Rash with or without itching . Blistering  or peeling of skin . Chest pain or symptoms of a heart attack. Most heart attacks involve pain in the center of the chest that lasts more than a few minutes. The pain may go away and come back or it can be constant. It can feel like pressure, squeezing, fullness, or pain. Sometimes pain is felt in one or both arms, the back, neck, jaw, or stomach. If any of these symptoms last 2 minutes, call 911 . Easy bleeding or bruising . Blood in urine or bowel movements . Feeling that your heart is beating in a fast or not normal way (palpitations) . Nausea that stops you from eating or drinking . Throwing up (vomiting) more than 3 times in one day . Abdominal pain . Loose bowel movements (diarrhea) 4 times in one day or diarrhea with weakness or lightheadedness . No bowel movement in 3 days or if you feel uncomfortable . Feeling dizzy  or lightheaded . Changes in your speech or vision   Bendamustine (Generic Name) Other Names: Donnie Aho  About This Drug This drug is used to treat cancer. It is given in the vein (IV).  Possible Side Effects (More Common) . Loose bowel movements (diarrhea) that may last for a few days . Constipation (not able to move bowels) . Fatigue . Fever . Nausea and vomiting. These symptoms may happen within several hours after you get the drug and may last up to 4 days after the drug has stopped. Medicines are available to prevent and lessen these side effects. . Rash or other skin problems . Weakness . Swelling (fluid retention) in the legs, ankles, and/or feet. . Bone marrow depression. This is a decrease in the number of white blood cells, red blood cells, and platelets. This may raise your risk of infection, make you tired and weak (fatigue), and raise your risk of bleeding. . Decreased appetite (decreased hunger) . Feeling dizzy . Increased total bilirubin in your blood. This may mean that you have changes in your liver function. Your blood work will be checked by your doctor. . Soreness of the mouth and throat. You may have red areas, white patches, or sores that hurt.  Possible Side Effects (Less Common) . Electrolyte changes. Your blood will be checked for electrolyte changes as needed. . While getting the drug in your vein, tell your nurse right away if you experience pain, redness, or swelling at the site of the IV infusion. . This drug may affect how your kidneys work. Your kidney function will be checked as needed. . Changes in your liver function. Your doctor will check your liver function as needed.  Infusion Reactions . Skin and tissue irritation may include redness, pain, warmth, or swelling at the IV site. This occurs if the drug leaks out of the vein and into nearby tissue. . While you are getting this drug in your vein, you may have a reaction. Your nurse will check you  closely for these signs: fever or shaking chills, flushing, facial swelling, feeling dizzy, headache, trouble breathing, rash, itching, chest tightness, or chest pain. . These reactions may happen the first 24 hours after you get this drug. Call 911 for emergency care.  Allergic Reactions Serious allergic reactions including anaphylaxis are rare. While you are getting this drug in your vein (IV), tell your nurse right away if you have any of the following symptoms of an allergic reaction: . Trouble catching your breath . Feeling like your tongue or throat are swelling . Feeling your  heart beat quickly or in a not normal way (palpitations) . Feeling dizzy or lightheaded . Flushing, itching, rash, and/or hives  Treating Side Effects . Ask your doctor or nurse about medicine that is available to help stop or lessen nausea, vomiting, diarrhea, or a fever. . Drink 6-8 cups of fluids every day unless your doctor has told you to limit your fluid intake due to some other health problem. A cup is 8 ounces of fluid. If you throw up or have loose bowel movements you should drink more fluids so that you do not become dehydrated (lack water in the body due to losing too much fluid) . Do not put anything a rash unless your doctor or nurse says you may. Keep the area around the rash clean and dry. Ask your doctor for medicine if your rash bothers you. . If you are dizzy, get up slowly after sitting or lying down. . Mouth care is very important. Your mouth care should consist of regular, gentle cleaning of your teeth or dentures and rinsing your mouth with a mixture of 1/2 teaspoon of salt in 8 ounces of water or  teaspoon of sodium bicarbonate (baking soda) in 8 ounces of water. This should be done at least after every meal and at bedtime. . If you have mouth sores, avoid mouthwash that has alcohol. Avoid alcohol and smoking because they can bother your mouth and throat. . Ask your doctor or nurse about medicine  that is available to help stop or lessen constipation. . If you are not able to move your bowels, check with your doctor or nurse before you use enemas, laxatives, or suppositories  Food and Drug Interactions There are no known interactions of bendamustine with food. This drug may interact with other medicines. Tell your doctor and pharmacist about all the medicines and dietary supplements (vitamins, minerals, herbs and others) you are taking at this time. The safety and use of dietary supplements and alternative diets are often not known. Using these might affect your cancer or interfere with your treatment. Until more is known, you should not use dietary supplements or alternative diets without your cancer doctor's help.  Other Instructions Tell your doctor if you are allergic to Mannitol (found in some artificial sweeteners and breath fresheners).  When to call the doctor Call your doctor or nurse right away if you have any of these symptoms: . Fever of 100.5 F (38 C) or above . Chills . Bleeding or bruising that is not usual . Wheezing or trouble breathing . Rash or itching . Feeling dizzy or lightheaded . Feeling that your heart is beating in a fast or not normal way (palpitations) . Loose bowel movements (diarrhea) more than 4 times a day or diarrhea with weakness or feeling lightheaded . Blurred vision or other changes in eyesight . Pain when passing urine; blood in urine . Pain in your lower back or side . Confusion or agitation . Nausea that stops you from eating or drinking . Throwing up more than 3 times a day . Chest pain or symptoms of a heart attack. Most heart attacks involve pain in the center of the chest that lasts more than a few minutes. The pain may go away and come back. It can feel like pressure, squeezing, fullness, or pain. Sometimes pain is felt in one or both arms, the back, neck, jaw, or stomach. If any of these symptoms last 2 minutes, call 911. [name] Symptoms of a  stroke such as  sudden numbness or weakness of your face, arm, or leg, mostly on one side of your body; sudden confusion, trouble speaking or understanding; sudden trouble seeing in one or both eyes; sudden trouble walking, feeling dizzy, loss of balance or coordination; or sudden, bad headache with no known cause. If you have any of these symptoms for 2 minutes, call 911. . Signs of liver problems: dark urine, pale bowel movements, bad stomach pain, feeling very tired or weak, unusual itching, or yellowing of the eyes or skin. Call your doctor or nurse as soon as possible if any of these symptoms happen: . Change in hearing, ringing in the ears . Decreased urine . Unusual thirst or passing urine often . Painful mouth or throat that makes it hard to eat or drink . Nausea not relieved by prescribed medicines . Rash that is not relieved by prescribed medicines . Heavy menstrual period lasting longer than normal . Numbness, tingling, decreased feeling or weakness in fingers, toes, arms, or legs . Trouble walking or changes in the way you walk, clumsiness in buttoning clothes, opening jars, or other routine hand motions . Swelling of legs, ankles, or feet . Weight gain of 5 pounds in one week (fluid retention) . Lasting loss of appetite or rapid weight loss of five pounds in a week . Fatigue that interferes with your daily activities . Headache that does not go away . Painful, red, or swollen areas on your hands or feet. . No bowel movement for 3 days or you feel uncomfortable . Extreme weakness that interferes with normal activities  Reproduction Concerns . Pregnancy warning: This drug may have harmful effects on the unborn child, so effective methods of birth control should be used by both men and women during your cancer treatment, and for at least 3 months after your treatment is finished. . Genetic counseling is available for you to talk about the effects of this drug therapy on future  pregnancies. Also, a genetic counselor can look at the possible risk of problems in the unborn baby due to this medicine if an exposure happens during pregnancy. . Breast feeding warning: It is not known if this drug passes into breast milk. For this reason, women should talk to their doctor about the risks and benefits of breast feeding during treatment with this drug because this drug may enter the breast milk and badly harm a breast feeding baby. Infertility . Sexual problems and reproduction concerns may happen. In both men and women, this drug may affect your ability to have children. This cannot be determined before your treatment. Talk with your doctor or nurse if you plan to have children. Ask for information on sperm or egg banking. . In men, this drug may interfere with your ability to make sperm, but it should not change your ability to have sexual relations. . In women, menstrual bleeding may become irregular or stop while you are getting this drug. Do not assume that you cannot become pregnant if you do not have a menstrual period. . Women may go through signs of menopause (change of life) like vaginal dryness or itching. Vaginal lubricants can be used to lessen vaginal dryness, itching, and pain during sexual relations. . Genetic counseling is available for you to talk about the effects of this drug therapy on future pregnancies. Also, a genetic counselor can look at the possible risk of problems in the unborn baby due to this medicine if an exposure happens during pregnancy.   EDUCATIONAL MATERIALS GIVEN  AND REVIEWED: Chemotherapy and you book given, nutrition book given, information on rituxan and bendamustine.     SELF CARE ACTIVITIES WHILE ON CHEMOTHERAPY: Hydration Increase your fluid intake 48 hours prior to treatment and drink at least 8 to 12 cups (64 ounces) of water/decaff beverages per day after treatment. You can still have your cup of coffee or soda but these beverages do  not count as part of your 8 to 12 cups that you need to drink daily. No alcohol intake.  Medications Continue taking your normal prescription medication as prescribed.  If you start any new herbal or new supplements please let us know first to make sure it is safe.  Mouth Care Have teeth cleaned professionally before starting treatment. Keep dentures and partial plates clean. Use soft toothbrush and do not use mouthwashes that contain alcohol. Biotene is a good mouthwash that is available at most pharmacies or may be ordered by calling [phone]. Use warm salt water gargles (1 teaspoon salt per 1 quart warm water) before and after meals and at bedtime. Or you may rinse with 2 tablespoons of three-percent hydrogen peroxide mixed in eight ounces of water. If you are still having problems with your mouth or sores in your mouth please call the clinic. If you need dental work, please let Dr. [name] know before you go for your appointment so that we can coordinate the best possible time for you in regards to your chemo regimen. You need to also let your dentist know that you are actively taking chemo. We may need to do labs prior to your dental appointment.   Skin Care Always use sunscreen that has not expired and with SPF (Sun Protection Factor) of 50 or higher. Wear hats to protect your head from the sun. Remember to use sunscreen on your hands, ears, face, & feet.  Use good moisturizing lotions such as udder cream, eucerin, or even Vaseline. Some chemotherapies can cause dry skin, color changes in your skin and nails.    . Avoid long, hot showers or baths. . Use gentle, fragrance-free soaps and laundry detergent. . Use moisturizers, preferably creams or ointments rather than lotions because the thicker consistency is better at preventing skin dehydration. Apply the cream or ointment within 15 minutes of showering. Reapply moisturizer at night, and moisturize your hands every time after you wash  them.  Hair Loss (if your doctor says your hair will fall out)  . If your doctor says that your hair is likely to fall out, decide before you begin chemo whether you want to wear a wig. You may want to shop before treatment to match your hair color. . Hats, turbans, and scarves can also camouflage hair loss, although some people prefer to leave their heads uncovered. If you go bare-headed outdoors, be sure to use sunscreen on your scalp. . Cut your hair short. It eases the inconvenience of shedding lots of hair, but it also can reduce the emotional impact of watching your hair fall out. . Don't perm or color your hair during chemotherapy. Those chemical treatments are already damaging to hair and can enhance hair loss. Once your chemo treatments are done and your hair has grown back, it's OK to resume dyeing or perming hair. With chemotherapy, hair loss is almost always temporary. But when it grows back, it may be a different color or texture. In older adults who still had hair color before chemotherapy, the new growth may be completely gray.  Often, new hair  is very fine and soft.  Infection Prevention Please wash your hands for at least 30 seconds using warm soapy water. Handwashing is the #1 way to prevent the spread of germs. Stay away from sick people or people who are getting over a cold. If you develop respiratory systems such as green/yellow mucus production or productive cough or persistent cough let us know and we will see if you need an antibiotic. It is a good idea to keep a pair of gloves on when going into grocery stores/[employer] to decrease your risk of coming into contact with germs on the carts, etc. Carry alcohol hand gel with you at all times and use it frequently if out in public. If your temperature reaches 100.5 or higher please call the clinic and let us know.  If it is after hours or on the weekend please go to the ER if your temperature is over 100.5.  Please have your own personal  thermometer at home to use.    Sex and bodily fluids If you are going to have sex, a condom must be used to protect the person that isn't taking chemotherapy. Chemo can decrease your libido (sex drive). For a few days after chemotherapy, chemotherapy can be excreted through your bodily fluids.  When using the toilet please close the lid and flush the toilet twice.  Do this for a few day after you have had chemotherapy.     Effects of chemotherapy on your sex life Some changes are simple and won't last long. They won't affect your sex life permanently. Sometimes you may feel: . too tired . not strong enough to be very active . sick or sore  . not in the mood . anxious or low Your anxiety might not seem related to sex. For example, you may be worried about the cancer and how your treatment is going. Or you may be worried about money, or about how you family are coping with your illness. These things can cause stress, which can affect your interest in sex. It's important to talk to your partner about how you feel. Remember - the changes to your sex life don't usually last long. There's usually no medical reason to stop having sex during chemo. The drugs won't have any long term physical effects on your performance or enjoyment of sex. Cancer can't be passed on to your partner during sex  Contraception It's important to use reliable contraception during treatment. Avoid getting pregnant while you or your partner are having chemotherapy. This is because the drugs may harm the baby. Sometimes chemotherapy drugs can leave a man or woman infertile.  This means you would not be able to have children in the future. You might want to talk to someone about permanent infertility. It can be very difficult to learn that you may no longer be able to have children. Some people find counselling helpful. There might be ways to preserve your fertility, although this is easier for men than for women. You may want to  speak to a fertility expert. You can talk about sperm banking or harvesting your eggs. You can also ask about other fertility options, such as donor eggs. If you have or have had breast cancer, your doctor might advise you not to take the contraceptive pill. This is because the hormones in it might affect the cancer.  It is not known for sure whether or not chemotherapy drugs can be passed on through semen or secretions from the vagina. Because of  this some doctors advise people to use a barrier method if you have sex during treatment. This applies to vaginal, anal or oral sex. Generally, doctors advise a barrier method only for the time you are actually having the treatment and for about a week after your treatment. Advice like this can be worrying, but this does not mean that you have to avoid being intimate with your partner. You can still have close contact with your partner and continue to enjoy sex.  Animals If you have cats or birds we just ask that you not change the litter or change the cage.  Please have someone else do this for you while you are on chemotherapy.   Food Safety During and After Cancer Treatment Food safety is important for people both during and after cancer treatment. Cancer and cancer treatments, such as chemotherapy, radiation therapy, and stem cell/bone marrow transplantation, often weaken the immune system. This makes it harder for your body to protect itself from foodborne illness, also called food poisoning. Foodborne illness is caused by eating food that contains harmful bacteria, parasites, or viruses.  Foods to avoid Some foods have a higher risk of becoming tainted with bacteria. These include: [name] Unwashed fresh fruit and vegetables, especially leafy vegetables that can hide dirt and other contaminants . Raw sprouts, such as alfalfa sprouts . Raw or undercooked beef, especially ground beef, or other raw or undercooked meat and poultry . Fatty, fried, or spicy  foods immediately before or after treatment.  These can sit heavy on your stomach and make you feel nauseous. . Raw or undercooked shellfish, such as oysters. . Sushi and sashimi, which often contain raw fish.  . Unpasteurized beverages, such as unpasteurized fruit juices, raw milk, raw yogurt, or cider . Undercooked eggs, such as soft boiled, over easy, and poached; raw, unpasteurized eggs; or foods made with raw egg, such as homemade raw cookie dough and homemade mayonnaise Simple steps for food safety Shop smart. . Do not buy food stored or displayed in an unclean area. . Do not buy bruised or damaged fruits or vegetables. . Do not buy cans that have cracks, dents, or bulges. . Pick up foods that can spoil at the end of your shopping trip and store them in a cooler on the way home. Prepare and clean up foods carefully. . Rinse all fresh fruits and vegetables under running water, and dry them with a clean towel or paper towel. . Clean the top of cans before opening them. . After preparing food, wash your hands for 20 seconds with hot water and soap. Pay special attention to areas between fingers and under nails. . Clean your utensils and dishes with hot water and soap. [name] Disinfect your kitchen and cutting boards using 1 teaspoon of liquid, unscented bleach mixed into 1 quart of water.   Dispose of old food. . Eat canned and packaged food before its expiration date (the "use by" or "best before" date). . Consume refrigerated leftovers within 3 to 4 days. After that time, throw out the food. Even if the food does not smell or look spoiled, it still may be unsafe. Some bacteria, such as Listeria, can grow even on foods stored in the refrigerator if they are kept for too long. Take precautions when eating out. . At restaurants, avoid buffets and salad bars where food sits out for a long time and comes in contact with many people. Food can become contaminated when someone with a virus, often a  norovirus, or another "bug" handles it. . Put any leftover food in a "to-go" container yourself, rather than having the server do it. And, refrigerate leftovers as soon as you get home. . Choose restaurants that are clean and that are willing to prepare your food as you order it cooked.    MEDICATIONS:                                                                                                                                 Zofran/Ondansetron 8mg  tablet. Take 1 tablet every 8 hours as needed for nausea/vomiting. (#1 nausea med to take, this can constipate)  Compazine/Prochlorperazine 10mg  tablet. Take 1 tablet every 6 hours as needed for nausea/vomiting. (#2 nausea med to take, this can make you sleepy)   EMLA cream. Apply a quarter size amount to port site 1 hour prior to chemo. Do not rub in. Cover with plastic wrap.   Over-the-Counter Meds:  Miralax 1 capful in 8 oz of fluid daily. May increase to two times a day if needed. This is a stool softener. If this doesn't work proceed you can add:  Senokot S-start with 1 tablet two times a day and increase to 4 tablets two times a day if needed. (total of 8 tablets in a 24 hour period). This is a stimulant laxative.   Call us if this does not help your bowels move.   Imodium 2mg  capsule. Take 2 capsules after the 1st loose stool and then 1 capsule every 2 hours until you go a total of 12 hours without having a loose stool. Call the [facility] if loose stools continue. If diarrhea occurs @ bedtime, take 2 capsules @ bedtime. Then take 2 capsules every 4 hours until morning. Call [facility].     Constipation Sheet *Miralax in 8 oz of fluid daily.  May increase to two times a day if needed.  This is a stool softener.  If this not enough to keep your bowel regular:  You can add:  *Senokot S, start with one tablet twice a day and can increase to 4 tablets twice a day if needed.  This is a stimulant laxative.   Sometimes when you  take pain medication you need BOTH a medicine to keep your stool soft and a medicine to help your bowel push it out!  Please call if the above does not work for you.   Do not go more than 2 days without a bowel movement.  It is very important that you do not become constipated.  It will make you feel sick to your stomach (nausea) and can cause abdominal pain and vomiting.     Diarrhea Sheet  If you are having loose stools/diarrhea, please purchase Imodium and begin taking as outlined:  At the first sign of poorly formed or loose stools you should begin taking Imodium(loperamide) 2 mg capsules.  Take two caplets (4mg ) followed by one caplet (  2mg ) every 2 hours until you have had no diarrhea for 12 hours.  During the night take two caplets (4mg ) at bedtime and continue every 4 hours during the night until the morning.  Stop taking Imodium only after there is no sign of diarrhea for 12 hours.    Always call the [facility] if you are having loose stools/diarrhea that you can't get under control.  Loose stools/disrrhea leads to dehydration (loss of water) in your body.  We have other options of trying to get the loose stools/diarrhea to stopped but you must let us know!     Nausea Sheet  Zofran/Ondansetron 8mg  tablet. Take 1 tablet every 8 hours as needed for nausea/vomiting. (#1 nausea med to take, this can constipate)  Compazine/Prochlorperazine 10mg  tablet. Take 1 tablet every 6 hours as needed for nausea/vomiting. (#2 nausea med to take, this can make you sleepy)  You can take these medications together or separately.  We would first like for you to try the Ondansetron by itself and then take the Prochloperizine if needed. But you are allowed to take both medications at the same time if your nausea is that severe.  If you are having persistent nausea (nausea that does not stop) please take these medications on a staggered schedule so that the nausea medication stays in your body.  Please call  the [facility] and let us know the amount of nausea that you are experiencing.  If you begin to vomit, you need to call the [facility] and if it is the weekend and you have vomited more than one time and cant get it to stop-go to the Emergency Room.  Persistent nausea/vomiting can lead to dehydration (loss of fluid in your body) and will make you feel terrible.   Ice chips, sips of clear liquids, foods that are @ room temperature, crackers, and toast tend to be better tolerated.     SYMPTOMS TO REPORT AS SOON AS POSSIBLE AFTER TREATMENT:  FEVER GREATER THAN 100.5 F  CHILLS WITH OR WITHOUT FEVER  NAUSEA AND VOMITING THAT IS NOT CONTROLLED WITH YOUR NAUSEA MEDICATION  UNUSUAL SHORTNESS OF BREATH  UNUSUAL BRUISING OR BLEEDING  TENDERNESS IN MOUTH AND THROAT WITH OR WITHOUT PRESENCE OF ULCERS  URINARY PROBLEMS  BOWEL PROBLEMS  UNUSUAL RASH    Wear comfortable clothing and clothing appropriate for easy access to any Portacath or PICC line. Let us know if there is anything that we can do to make your therapy better!     What to do if you need assistance after hours or on the weekends: CALL [phone].  HOLD on the line, do not hang up.  You will hear multiple messages but at the end you will be connected with a nurse triage line.  They will contact Dr [name] if necessary.  Most of the time they will be able to assist you.   Do not call the hospital operator.  Dr [name] will not answer phone calls received by them.     I have been informed and understand all of the instructions given to me and have received a copy. I have been instructed to call the clinic [phone] or my family physician as soon as possible for continued medical care, if indicated. I do not have any more questions at this time but understand that I may call the [facility] or the Patient Navigator at [phone] during office hours should I have questions or need assistance in obtaining follow-up  care.

## 2016-05-09 NOTE — Progress Notes (Signed)
Randall Gravel, MD [address] [place] [address]  CLL (chronic lymphocytic leukemia) [facility]) - Plan: IgG, IgA, IgM, allopurinol (ZYLOPRIM) 100 MG tablet, CBC with Differential, Comprehensive metabolic panel, Magnesium, Phosphorus, Uric acid, Hepatitis B core antibody, IgM, Hepatitis B surface antigen, Uric acid, IgG, IgA, IgM, Hepatitis B core antibody, IgM, Hepatitis B surface antigen, Uric acid, CBC with Differential, Comprehensive metabolic panel, Uric acid, CANCELED: CBC with Differential, CANCELED: Comprehensive metabolic panel, CANCELED: Lactate dehydrogenase  Cough - Plan: IgG, IgA, IgM, IgG, IgA, IgM  Macrocytic anemia - Plan: Vitamin B12, Folate, Iron and TIBC, Ferritin, Vitamin B12, Folate, Iron and TIBC, Ferritin  Acute nasopharyngitis - Plan: azithromycin (ZITHROMAX) 250 MG tablet  CURRENT THERAPY: Observation and B12 injections monthly  INTERVAL HISTORY: Randall Reyes 80 y.o. male returns for followup of CLL. AND B12 deficiency, on IM B12 replacement therapy.    CLL (chronic lymphocytic leukemia) ([place])   02/14/2005 Procedure    Peripheral flow cytometry- CLL (without 11q or 17 p deletion) presenting with stage 0 disease      10/25/2008 Progression    Increasing WBC and symptoms suggestive of progressive disease      11/09/2008 - 01/07/2009 Chemotherapy    Rituxan, fludarabine, cytoxan x 3 cycles with the discontinuation of fludarabine and cytoxan due to severe pancytopenia requiring hospitalization.      01/10/2009 Adverse Reaction    Severe pancytopenia and adverse side effects      02/01/2009 - 04/01/2009 Chemotherapy    Rituxan every 21 days x 3 cycles      04/02/2009 Remission    Stable disease.  Chemotherapy holiday started      06/22/2013 - 06/29/2013 Chemotherapy    Imbruvica daily      06/29/2013 Adverse Reaction    Large right posterior leg (popliteal) ecchymosis/violaceous rash.  Likely Imbruvica-induced      07/31/2013  - 10/12/2013 Chemotherapy    Zydelig (idelalisib) 150 mg BID monotherapy      10/12/2013 Adverse Reaction    Transaminitis, suspected to be secondary to [place].  Dose decreased.      10/12/2013 - 11/09/2013 Chemotherapy    Zydelig (idelalisib) 150 mg daily.      11/09/2013 Adverse Reaction    Worsening transaminitis.  Zydelig on HOLD      11/09/2013 Treatment Plan Change    Hold Zydelig      12/28/2013 Adverse Reaction    Herpes Zoster outbreak.  Partial response with Famvir x 1 week, switched to acyclovir.  Progression over 1-2 weeks with development of keratoconjunctivitis bilaterally.  VZ Immune Globulin 625 mg IM given on 01/13/14.      02/08/2014 Treatment Plan Change    Ibrutinib restarted 140 mg daily for one week followed by 280 mg daily followed by 420 mg daily with prophylactic acyclovir.      02/08/2014 - 07/05/2014 Chemotherapy    Ibrutinib 420 mg daily      1January 31, 202015 - 04/17/2014 Hospital Admission    Right lower lobe pneumonia      07/05/2014 Adverse Reaction    Fissuring of fingertips and sore, red feet with transaminitis      07/05/2014 Treatment Plan Change    Hold Ibrutinib      07/22/2014 - 08/19/2014 Chemotherapy    Ibrutinib restarted at 280 mg daily.      08/09/2014 - 08/12/2014 Hospital Admission    Bilateral pneumonia      08/19/2014 Adverse Reaction    Nausea  with vomiting.  Escorted to ED.      08/19/2014 Treatment Plan Change    Ibrutinib on hold- ED escort from clinic       10/18/2014 Miscellaneous    B12 weekly x 4 then monthly.      12/15/2015 Imaging    Bone density- BMD as determined from Femur Neck Left is 0.803 g/cm2 with a T-score of -2.1. This patient is considered osteopenic by World Healh Organization (WHO) Criteria.      12/16/2015 Imaging    CT abd/pelvis-  Progression of retroperitoneal and portacaval adenopathy compared to the prior study. Likely related to CLL.  Small hiatal hernia  Mild compression fractures T11 and T12,  probably old. Correlate with any pain in this area.      He reports a cough x 1 month that is productive of yellow sputum, afebrile, no sore throat, no sinus pressure or headache.  He denies having an antibiotic for this issue.  It started at the beginning of December.  He denies any B symptoms.  Weight is stable.  Appetite is strong.  He has a left frontal skin lesion that should be addressed by dermatology.  He has seen Dr. Nevada Crane in the past.  Review of Systems  Constitutional: Negative.  Negative for chills, fever, malaise/fatigue and weight loss.  HENT: Negative.  Negative for congestion, sinus pain and sore throat.   Eyes: Negative.   Respiratory: Positive for cough and sputum production (yellow).   Cardiovascular: Negative.  Negative for chest pain and leg swelling.  Gastrointestinal: Negative for abdominal pain, constipation, diarrhea, nausea and vomiting.  Genitourinary: Negative.   Musculoskeletal: Negative.   Skin:       Left frontal scalp lesion  Neurological: Negative.  Negative for weakness.  Endo/Heme/Allergies: Negative.  Does not bruise/bleed easily.  Psychiatric/Behavioral: Negative.     Past Medical History:  Diagnosis Date  . Anemia   . B12 deficiency 12/07/2014  . CLL (chronic lymphocytic leukemia) ([place]) 01/24/2011  . Diabetes mellitus   . DM (diabetes mellitus) ([place]) 01/24/2011  . Eczema 01/24/2011  . History of pneumonia 04/2014  . Hypertension   . Leukemia ([place]) 6.22.2012   PER PATIENT  . Melanoma in situ ([place]) 01/24/2011  . Mild obesity 01/24/2011  . Port catheter in place 05/02/2012  . Shingles   . Ulcer [facility])     Past Surgical History:  Procedure Laterality Date  . bleeding ulcer    . CATARACT EXTRACTION W/PHACO  05/05/2012   Procedure: CATARACT EXTRACTION PHACO AND INTRAOCULAR LENS PLACEMENT (IOC);  Surgeon: Tonny Branch, MD;  Location: [facility] ORS;  Service: Ophthalmology;  Laterality: Right;  CDE:13.25  . CATARACT EXTRACTION W/PHACO  05/15/2012    Procedure: CATARACT EXTRACTION PHACO AND INTRAOCULAR LENS PLACEMENT (IOC);  Surgeon: Tonny Branch, MD;  Location: [facility] ORS;  Service: Ophthalmology;  Laterality: Left;  CDE:  12.32  . COLONOSCOPY N/A 01/30/2016   Procedure: COLONOSCOPY;  Surgeon: Danie Binder, MD;  Location: [facility] ENDO SUITE;  Service: Endoscopy;  Laterality: N/A;  2:00 PM - moved to 1:00 - office notified pt  . ESOPHAGOGASTRODUODENOSCOPY  2008   Dr. Oneida Alar: normal esophagus, antral erythema, 1 cm clean based duodenal ulcera, negative H. plyori  . ESOPHAGOGASTRODUODENOSCOPY N/A 01/30/2016   Procedure: ESOPHAGOGASTRODUODENOSCOPY (EGD);  Surgeon: Danie Binder, MD;  Location: [facility] ENDO SUITE;  Service: Endoscopy;  Laterality: N/A;  . HERNIA REPAIR  2001  . PORTACATH PLACEMENT  2008    Family History  Problem Relation Age of  Onset  . Diabetes Father   . Colon cancer Neg Hx     Social History   Social History  . Marital status: Married    Spouse name: N/A  . Number of children: N/A  . Years of education: N/A   Social History Main Topics  . Smoking status: Former Research scientist (life sciences)  . Smokeless tobacco: Former Systems developer    Quit date: 04/25/1967  . Alcohol use No  . Drug use: No  . Sexual activity: Not Asked   Other Topics Concern  . None   Social History Narrative  . None     PHYSICAL EXAMINATION  ECOG PERFORMANCE STATUS: 0 - Asymptomatic  Vitals:   05/09/16 1044  BP: 125/68  Pulse: (!) 58  Resp: 16  Temp: 97.9 F (36.6 C)     GENERAL:alert, healthy, no distress, well nourished, well developed, comfortable, cooperative, smiling and accompanied by his wife SKIN: skin color, texture, turgor are normal, no rashes or significant lesions HEAD: Normocephalic, No masses, lesions, tenderness or abnormalities, left frontal scalp lesion that is hyperkeratotic in nature measuring 1.5 cm in size EYES: normal, EOMI, Conjunctiva are pink and non-injected EARS: External ears normal OROPHARYNX: buccal mucosa, and tongue normal and mucous  membranes are moist, lower right lip lesion NECK: supple, no adenopathy, trachea midline LYMPH:  no palpable lymphadenopathy, no hepatosplenomegaly BREAST:not examined LUNGS: clear to auscultation and percussion HEART: regular rate & rhythm, no murmurs, no gallops, S1 normal and S2 normal ABDOMEN:abdomen soft, non-tender, normal bowel sounds and no masses or organomegaly BACK: Back symmetric, no curvature., No CVA tenderness EXTREMITIES:less then 2 second capillary refill, no joint deformities, effusion, or inflammation, no skin discoloration, no cyanosis  NEURO: alert & oriented x 3 with fluent speech, no focal motor/sensory deficits, gait normal  LABORATORY DATA: CBC    Component Value Date/Time   WBC 212.4 (HH) 05/09/2016 0925   RBC 2.68 (L) 05/09/2016 0925   HGB 9.8 (L) 05/09/2016 0925   HCT 30.3 (L) 05/09/2016 0925   PLT 172 05/09/2016 0925   MCV 113.1 (H) 05/09/2016 0925   MCH 36.6 (H) 05/09/2016 0925   MCHC 32.3 05/09/2016 0925   RDW 15.8 (H) 05/09/2016 0925   LYMPHSABS 95.8 (H) 02/29/2016 0901   MONOABS 3.2 (H) 02/29/2016 0901   EOSABS 1.1 (H) 02/29/2016 0901   BASOSABS 0.0 02/29/2016 0901      Chemistry      Component Value Date/Time   NA 138 05/09/2016 0925   K 4.7 05/09/2016 0925   CL 105 05/09/2016 0925   CO2 26 05/09/2016 0925   BUN 21 (H) 05/09/2016 0925   CREATININE 2.10 (H) 05/09/2016 0925      Component Value Date/Time   CALCIUM 8.9 05/09/2016 0925   CALCIUM 8.3 (L) 02/03/2015 0639   ALKPHOS 93 05/09/2016 0925   AST 20 05/09/2016 0925   ALT 13 (L) 05/09/2016 0925   BILITOT 0.5 05/09/2016 0925        PENDING LABS:   RADIOGRAPHIC STUDIES:  No results found.   PATHOLOGY:    ASSESSMENT AND PLAN:  CLL (chronic lymphocytic leukemia) ([place]) CLL, diagnosed in 2006 and per records was treated with FCR in 2010 secondary to a rapid doubling time of his WBC count up to 290K with diffuse LAD, hypogammaglobulinemia. He only received 3 cycles of FCR  secondary to prolonged pancytopenia.  In Jan 2015, he was started on Imbruvica for a rising wbc and increased fatigue; other counts were relatively preserved. WBC count at that  time was 36K. Therapy was interrupted secondary to rash. He was then started on Idelalisib on 07/31/2013. This therapy was held secondary to severe zoster outbreak diagnosed on 12/28/2013. Patient was given Varicella Zoster Immune Globulin IM on 01/13/2014. He was then restarted on Imbruvica on 02/08/2014. He was dose reduced secondary to abnormal LFT's and now therapy has been discontinued secondary to pneumonia and generalized decline. Now with significant WBC doubling and progressive anemia requiring systemic chemotherapy.  Labs today: CBC diff, CMET, LDH.  I personally reviewed and went over laboratory results with the patient.  The results are noted within this dictation.  WBC has doubled in ~ 8 weeks and in now >200.  Significant worsening of anemia is also noted compared to 8 weeks ago.  Just to confirm no vitamin deficiency contributing to his progressive macrocytic anemia, I will add an anemia panel to labs today too.  Platelet count is WNL.  Renal function is stable and K+ is WNL.  As a result of his lab changes, he needs to be re-challenged with chemotherapy again.  Due to persistent cough x 1 month, I will add IgG, IgM, and IgA testing to labs today to see whether he is hypogammaglobulinemic and therefore requiring IVIG.  He has not been given an antibiotic to date for this issue and therefore I will give him an extended course of Z-PAK.  We will plan on starting Bendamustine/Rituxan next week with Bendamustine being reduced at 50 mg/m2 to start with and with dose-escalation in future depending on tolerability.  I reviewed the risks, benefits, alternatives, and side effects of this therapy.  He will be referred for chemotherapy teaching.  We will start next week given his quick doubling of WBC and progressive anemia.  I will  start Allopurinol at a reduced dose (50% reduced) due to his renal function.  Rx is printed for 100 mg TID.  He has a port in place that is functioning (it does infuse, but no blood return).  He continues to follow with Fran Lowes, MD (Nephrology).  He is no longer taking kayexalate.  He will return 7-10 days post day 1, cycle 1 for NADIR labs and follow-up.   ORDERS PLACED FOR THIS ENCOUNTER: Orders Placed This Encounter  Procedures  . IgG, IgA, IgM  . Vitamin B12  . Folate  . Iron and TIBC  . Ferritin  . CBC with Differential  . Comprehensive metabolic panel  . Magnesium  . Phosphorus  . Uric acid  . Hepatitis B core antibody, IgM  . Hepatitis B surface antigen  . Uric acid  . CBC with Differential  . Comprehensive metabolic panel  . Uric acid    MEDICATIONS PRESCRIBED THIS ENCOUNTER: Meds ordered this encounter  Medications  . sodium bicarbonate 650 MG tablet    Sig: TAKE 1 TABLET BY MOUTH TWICE A DAY AS DIRECTED    Refill:  3  . azithromycin (ZITHROMAX) 250 MG tablet    Sig: Take 2 tablets PO today and then 1 daily thereafter until Rx is complete x 10 days    Dispense:  11 each    Refill:  0    Order Specific Question:   Supervising Provider    Answer:   Patrici Ranks [id number]  . allopurinol (ZYLOPRIM) 100 MG tablet    Sig: Take 1 tablet (100 mg total) by mouth 3 (three) times daily.    Dispense:  90 tablet    Refill:  1    Order Specific  Question:   Supervising Provider    Answer:   Patrici Ranks [id number]    THERAPY PLAN:  Re-challenge with BR chemotherapy and continued monthly B12 injections.  All questions were answered. The patient knows to call the clinic with any problems, questions or concerns. We can certainly see the patient much sooner if necessary.  Patient and plan discussed with Dr. Ancil Linsey and she is in agreement with the aforementioned.   This note is electronically signed by: Doy Mince 05/09/2016 11:57  AM

## 2016-05-09 NOTE — Assessment & Plan Note (Addendum)
CLL, diagnosed in 2006 and per records was treated with FCR in 2010 secondary to a rapid doubling time of his WBC count up to 290K with diffuse LAD, hypogammaglobulinemia. He only received 3 cycles of FCR secondary to prolonged pancytopenia.  In Jan 2015, he was started on Imbruvica for a rising wbc and increased fatigue; other counts were relatively preserved. WBC count at that time was 36K. Therapy was interrupted secondary to rash. He was then started on Idelalisib on 07/31/2013. This therapy was held secondary to severe zoster outbreak diagnosed on 12/28/2013. Patient was given Varicella Zoster Immune Globulin IM on 01/13/2014. He was then restarted on Imbruvica on 02/08/2014. He was dose reduced secondary to abnormal LFT's and now therapy has been discontinued secondary to pneumonia and generalized decline. Now with significant WBC doubling and progressive anemia requiring systemic chemotherapy.  Labs today: CBC diff, CMET, LDH.  I personally reviewed and went over laboratory results with the patient.  The results are noted within this dictation.  WBC has doubled in ~ 8 weeks and in now >200.  Significant worsening of anemia is also noted compared to 8 weeks ago.  Just to confirm no vitamin deficiency contributing to his progressive macrocytic anemia, I will add an anemia panel to labs today too.  Platelet count is WNL.  Renal function is stable and K+ is WNL.  As a result of his lab changes, he needs to be re-challenged with chemotherapy again.  Due to persistent cough x 1 month, I will add IgG, IgM, and IgA testing to labs today to see whether he is hypogammaglobulinemic and therefore requiring IVIG.  He has not been given an antibiotic to date for this issue and therefore I will give him an extended course of Z-PAK.  We will plan on starting Bendamustine/Rituxan next week with Bendamustine being reduced at 50 mg/m2 to start with and with dose-escalation in future depending on tolerability.  I reviewed  the risks, benefits, alternatives, and side effects of this therapy.  He will be referred for chemotherapy teaching.  We will start next week given his quick doubling of WBC and progressive anemia.  I will start Allopurinol at a reduced dose (50% reduced) due to his renal function.  Rx is printed for 100 mg TID.  He has a port in place that is functioning (it does infuse, but no blood return).  He continues to follow with Fran Lowes, MD (Nephrology).  He is no longer taking kayexalate.  I recommend a follow-up appointment with Dr. [place] Crane (Dermatology) for left frontal skin lesion.  He will return 7-10 days post day 1, cycle 1 for NADIR labs and follow-up.

## 2016-05-10 LAB — HEPATITIS B SURFACE ANTIGEN: HEP B S AG: NEGATIVE

## 2016-05-10 LAB — HEPATITIS B CORE ANTIBODY, IGM: Hep B C IgM: NEGATIVE

## 2016-05-10 LAB — IGG, IGA, IGM
IGA: 30 mg/dL — AB (ref 61–437)
IGG (IMMUNOGLOBIN G), SERUM: 1676 mg/dL — AB (ref 700–1600)
IGM, SERUM: 30 mg/dL (ref 15–143)

## 2016-05-10 MED ORDER — PROCHLORPERAZINE MALEATE 10 MG PO TABS: 10.0000 mg | ORAL_TABLET | Freq: Four times a day (QID) | ORAL | 2 refills | Status: DC | PRN

## 2016-05-10 MED ORDER — LIDOCAINE-PRILOCAINE 2.5-2.5 % EX CREA: TOPICAL_CREAM | CUTANEOUS | 2 refills | Status: DC

## 2016-05-10 MED ORDER — ONDANSETRON HCL 8 MG PO TABS: 8.0000 mg | ORAL_TABLET | Freq: Three times a day (TID) | ORAL | 2 refills | Status: DC | PRN

## 2016-05-16 ENCOUNTER — Encounter (HOSPITAL_COMMUNITY): Payer: Medicare Other | Attending: Oncology

## 2016-05-16 DIAGNOSIS — M310 Hypersensitivity angiitis: Secondary | ICD-10-CM | POA: Insufficient documentation

## 2016-05-16 DIAGNOSIS — C9110 Chronic lymphocytic leukemia of B-cell type not having achieved remission: Secondary | ICD-10-CM

## 2016-05-17 ENCOUNTER — Ambulatory Visit (HOSPITAL_COMMUNITY): Payer: Medicare Other

## 2016-05-17 ENCOUNTER — Other Ambulatory Visit (HOSPITAL_COMMUNITY): Payer: Self-pay | Admitting: Hematology & Oncology

## 2016-05-17 NOTE — Progress Notes (Signed)
START OFF PATHWAY REGIMEN - Lymphoma and CLL  Off Pathway: Bendamustine + Rituximab (70/500) q28 Days  [id number]:Bendamustine + Rituximab (70/500) q28 Days:   A cycle is every 28 days:     Bendamustine (Bendeka(TM)) 70 mg/m2 in 50 mL NS IV over 10 min days 1 and 2. Administer first Dose Mod: None     Rituximab (Rituxan(R)) 375 mg/m2 in _____ mL NS IV day 1 only.  Initiate first dose at a rate of 50 mg/hr.  In the absence of infusion toxicity, increase infusion rate by 50 mg/hr increments every 30 minutes, to a maximum of 400 mg/hr. This rituximab  dose is given for cycle 1 only Dose Mod: None     Rituximab (Rituxan(R)) 500 mg/m2 in _____ mL NS IV day 1 only.  If first dose tolerated, may initiate subsequent infusions at an initial rate of 100 mg/hr.  In the absence of infusion toxicity, may increase rate by 100 mg/hr increments at 30-min  intervals to a maximum of 400 mg/hr. This rituximab dosing is given for cycles 2 and beyond Dose Mod: None Additional Orders: Bendamustine use not recommended in patients with CrCl < 40 mL/min or with moderate (AST/ALT 2.5-10 xULN and total bili 1.5-3 xULN) or severe (total bili > 3 xULN) hepatic impairment.  Hepatitis B&C testing recommended prior to  rituximab use on all patients. Final rituximab concentration = 1 - 4 mg/mL.  **Always confirm dose/schedule in your pharmacy ordering system**    Patient Characteristics: Chronic Lymphocytic Leukemia (CLL), Third Line, 17p del (-), Prior Ibrutinib Disease Type: Chronic Lymphocytic Leukemia (CLL) Disease Type: Not Applicable Line of therapy: Third Line RAI Stage: III 17p Deletion Status: Negative Prior Treatment: Prior Ibrutinib  Intent of Therapy: Non-Curative / Palliative Intent, Discussed with Patient

## 2016-05-18 ENCOUNTER — Ambulatory Visit (HOSPITAL_COMMUNITY): Payer: Medicare Other

## 2016-05-18 MED ORDER — ACYCLOVIR 400 MG PO TABS: 400.0000 mg | ORAL_TABLET | Freq: Every day | ORAL | 3 refills | Status: DC

## 2016-05-22 ENCOUNTER — Other Ambulatory Visit (HOSPITAL_COMMUNITY): Payer: Self-pay | Admitting: Oncology

## 2016-05-22 ENCOUNTER — Encounter (HOSPITAL_BASED_OUTPATIENT_CLINIC_OR_DEPARTMENT_OTHER): Payer: Medicare Other

## 2016-05-22 ENCOUNTER — Encounter (HOSPITAL_COMMUNITY): Payer: Self-pay

## 2016-05-22 VITALS — BP 92/40 | HR 57 | Temp 98.2°F | Resp 16 | Wt 138.2 lb

## 2016-05-22 DIAGNOSIS — Z5111 Encounter for antineoplastic chemotherapy: Secondary | ICD-10-CM

## 2016-05-22 DIAGNOSIS — Z7189 Other specified counseling: Secondary | ICD-10-CM | POA: Insufficient documentation

## 2016-05-22 DIAGNOSIS — C9110 Chronic lymphocytic leukemia of B-cell type not having achieved remission: Secondary | ICD-10-CM

## 2016-05-22 DIAGNOSIS — Z5112 Encounter for antineoplastic immunotherapy: Secondary | ICD-10-CM

## 2016-05-22 MED ORDER — ACETAMINOPHEN 325 MG PO TABS
ORAL_TABLET | ORAL | Status: AC
  Filled 2016-05-22: qty 2

## 2016-05-22 MED ORDER — DIPHENHYDRAMINE HCL 25 MG PO CAPS
50.0000 mg | ORAL_CAPSULE | Freq: Once | ORAL | Status: AC
  Administered 2016-05-22: 50 mg via ORAL

## 2016-05-22 MED ORDER — DIPHENHYDRAMINE HCL 25 MG PO CAPS
ORAL_CAPSULE | ORAL | Status: AC
  Filled 2016-05-22: qty 2

## 2016-05-22 MED ORDER — PALONOSETRON HCL INJECTION 0.25 MG/5ML
INTRAVENOUS | Status: AC
Start: 2016-05-22 — End: 2016-05-22
  Filled 2016-05-22: qty 5

## 2016-05-22 MED ORDER — DEXAMETHASONE SODIUM PHOSPHATE 10 MG/ML IJ SOLN
10.0000 mg | Freq: Once | INTRAMUSCULAR | Status: AC
  Administered 2016-05-22: 10 mg via INTRAVENOUS

## 2016-05-22 MED ORDER — FAMOTIDINE IN NACL 20-0.9 MG/50ML-% IV SOLN
20.0000 mg | Freq: Once | INTRAVENOUS | Status: AC | PRN
  Administered 2016-05-22: 20 mg via INTRAVENOUS

## 2016-05-22 MED ORDER — ACETAMINOPHEN 325 MG PO TABS
650.0000 mg | ORAL_TABLET | Freq: Once | ORAL | Status: AC
Start: 2016-05-22 — End: 2016-05-22
  Administered 2016-05-22: 650 mg via ORAL

## 2016-05-22 MED ORDER — SODIUM CHLORIDE 0.9 % IV SOLN
45.0000 mg/m2 | Freq: Once | INTRAVENOUS | Status: AC
  Administered 2016-05-22: 75 mg via INTRAVENOUS
  Filled 2016-05-22: qty 3

## 2016-05-22 MED ORDER — SODIUM CHLORIDE 0.9 % IV SOLN
6.0000 mg | Freq: Once | INTRAVENOUS | Status: AC
  Administered 2016-05-22: 6 mg via INTRAVENOUS
  Filled 2016-05-22: qty 4

## 2016-05-22 MED ORDER — SODIUM CHLORIDE 0.9% FLUSH: 10.0000 mL | INTRAVENOUS | Status: DC | PRN

## 2016-05-22 MED ORDER — DIPHENHYDRAMINE HCL 50 MG/ML IJ SOLN
50.0000 mg | Freq: Once | INTRAMUSCULAR | Status: AC | PRN
Start: 2016-05-22 — End: 2016-05-22
  Administered 2016-05-22: 50 mg via INTRAVENOUS

## 2016-05-22 MED ORDER — METHYLPREDNISOLONE SODIUM SUCC 125 MG IJ SOLR
125.0000 mg | Freq: Once | INTRAMUSCULAR | Status: AC | PRN
  Administered 2016-05-22: 125 mg via INTRAVENOUS

## 2016-05-22 MED ORDER — HEPARIN SOD (PORK) LOCK FLUSH 100 UNIT/ML IV SOLN
500.0000 [IU] | Freq: Once | INTRAVENOUS | Status: AC | PRN
  Administered 2016-05-22: 500 [IU]
  Filled 2016-05-22: qty 5

## 2016-05-22 MED ORDER — SODIUM CHLORIDE 0.9 % IV SOLN
Freq: Once | INTRAVENOUS | Status: AC
  Administered 2016-05-22: 09:00:00 via INTRAVENOUS

## 2016-05-22 MED ORDER — SODIUM CHLORIDE 0.9 % IV SOLN
375.0000 mg/m2 | Freq: Once | INTRAVENOUS | Status: AC
  Administered 2016-05-22: 600 mg via INTRAVENOUS
  Filled 2016-05-22: qty 10

## 2016-05-22 MED ORDER — SODIUM CHLORIDE 0.9 % IV SOLN: 10.0000 mg | Freq: Once | INTRAVENOUS | Status: DC

## 2016-05-22 MED ORDER — PALONOSETRON HCL INJECTION 0.25 MG/5ML
0.2500 mg | Freq: Once | INTRAVENOUS | Status: AC
  Administered 2016-05-22: 0.25 mg via INTRAVENOUS

## 2016-05-22 MED ORDER — SODIUM CHLORIDE 0.9 % IV SOLN
6.0000 mg | Freq: Once | INTRAVENOUS | Status: DC
  Filled 2016-05-22: qty 4

## 2016-05-22 MED ORDER — DEXAMETHASONE SODIUM PHOSPHATE 10 MG/ML IJ SOLN
INTRAMUSCULAR | Status: AC
  Filled 2016-05-22: qty 1

## 2016-05-22 NOTE — Patient Instructions (Signed)
[facility] Discharge Instructions for Patients Receiving Chemotherapy   Beginning January 23rd 2017 lab work for the Cancer Center will be done in the  Main lab at [facility] on 1st floor. If you have a lab appointment with the Cancer Center please come in thru the  Main Entrance and check in at the main information desk   Today you received the following chemotherapy agents: Rituxan and bendamustine.     If you develop nausea and vomiting, or diarrhea that is not controlled by your medication, call the clinic.  The clinic phone number is [phone]. Office hours are Monday-Friday 8:30am-5:00pm.  BELOW ARE SYMPTOMS THAT SHOULD BE REPORTED IMMEDIATELY:  *FEVER GREATER THAN 101.0 F  *CHILLS WITH OR WITHOUT FEVER  NAUSEA AND VOMITING THAT IS NOT CONTROLLED WITH YOUR NAUSEA MEDICATION  *UNUSUAL SHORTNESS OF BREATH  *UNUSUAL BRUISING OR BLEEDING  TENDERNESS IN MOUTH AND THROAT WITH OR WITHOUT PRESENCE OF ULCERS  *URINARY PROBLEMS  *BOWEL PROBLEMS  UNUSUAL RASH Items with * indicate a potential emergency and should be followed up as soon as possible. If you have an emergency after office hours please contact your primary care physician or go to the nearest emergency department.  Please call the clinic during office hours if you have any questions or concerns.   You may also contact the Patient Navigator at [phone] should you have any questions or need assistance in obtaining follow up care.      Resources For Cancer Patients and their Caregivers ? American Cancer Society: Can assist with transportation, wigs, general needs, runs Look Good Feel Better.        1-888-227-6333 ? Cancer Care: Provides financial assistance, online support groups, medication/co-pay assistance.  1-800-813-HOPE (4673) ? [facility] Assists [place] cancer patients and their families through emotional , educational and financial support.   336-427-4357 ? [place] DSS Where to apply for food stamps, Medicaid and utility assistance. 336-342-1394 ? [facility]: Transportation to medical appointments. 336-347-2287 ? Social Security Administration: May apply for disability if have a Stage IV cancer. 336-342-7796 1-800-772-1213 ? [place] Aging, Disability and Transit Services: Assists with nutrition, care and transit needs. 336-349-2343          

## 2016-05-22 NOTE — Treatment Plan (Signed)
ok to treat with Crcl= 25. Halving dose 45mg /m2 day 1 and day 2

## 2016-05-22 NOTE — Progress Notes (Signed)
0945:  Pt reports "feeling hot" and being nauseated.  Rituxan infusion stopped and NS infusing via port at 250 ml/h.  MD notified and orders rec'd - medicated as ordered.  VSS.  Will continue to monitor.  1035:  Denies nausea.  Denies any other s/s.  Rituxan restarted at 50 mg/hr.  Call bell within reach - instructed to call should he begin feeling ill again.   1105:  Pt asymptomatic with no complaints.  Dr. [name] aware of BP.  Okay to increase Rituxan infusion rate per MD.   Tolerated remainder of Rituxan infusion w/o any adverse reaction.  Tolerated other infusions w/o adverse reaction.  Alert, VSS.  In no distress.  Discharged via wheelchair in c/o spouse for transport home.

## 2016-05-23 ENCOUNTER — Inpatient Hospital Stay (HOSPITAL_COMMUNITY): Payer: Medicare Other

## 2016-05-23 ENCOUNTER — Ambulatory Visit (HOSPITAL_COMMUNITY): Payer: Medicare Other

## 2016-05-23 ENCOUNTER — Encounter (HOSPITAL_COMMUNITY): Payer: Self-pay

## 2016-05-23 ENCOUNTER — Other Ambulatory Visit (HOSPITAL_COMMUNITY): Payer: Self-pay | Admitting: Oncology

## 2016-05-23 ENCOUNTER — Inpatient Hospital Stay (HOSPITAL_COMMUNITY)
Admission: EM | Admit: 2016-05-23 | Discharge: 2016-05-28 | DRG: 683 | Disposition: A | Discharge status: Home / Self Care | Payer: Medicare Other | Attending: Internal Medicine | Admitting: Internal Medicine

## 2016-05-23 ENCOUNTER — Encounter (HOSPITAL_BASED_OUTPATIENT_CLINIC_OR_DEPARTMENT_OTHER): Payer: Medicare Other

## 2016-05-23 ENCOUNTER — Encounter (HOSPITAL_BASED_OUTPATIENT_CLINIC_OR_DEPARTMENT_OTHER): Payer: Medicare Other | Admitting: Oncology

## 2016-05-23 ENCOUNTER — Other Ambulatory Visit (HOSPITAL_COMMUNITY): Payer: Self-pay | Admitting: Pharmacist

## 2016-05-23 ENCOUNTER — Other Ambulatory Visit: Payer: Self-pay

## 2016-05-23 ENCOUNTER — Encounter (HOSPITAL_COMMUNITY): Payer: Medicare Other

## 2016-05-23 VITALS — BP 83/36 | HR 32 | Temp 97.6°F | Resp 16

## 2016-05-23 DIAGNOSIS — I498 Other specified cardiac arrhythmias: Secondary | ICD-10-CM

## 2016-05-23 DIAGNOSIS — Z886 Allergy status to analgesic agent status: Secondary | ICD-10-CM | POA: Diagnosis not present

## 2016-05-23 DIAGNOSIS — N170 Acute kidney failure with tubular necrosis: Secondary | ICD-10-CM | POA: Diagnosis present

## 2016-05-23 DIAGNOSIS — I129 Hypertensive chronic kidney disease with stage 1 through stage 4 chronic kidney disease, or unspecified chronic kidney disease: Secondary | ICD-10-CM | POA: Diagnosis present

## 2016-05-23 DIAGNOSIS — Z794 Long term (current) use of insulin: Secondary | ICD-10-CM | POA: Diagnosis not present

## 2016-05-23 DIAGNOSIS — I499 Cardiac arrhythmia, unspecified: Secondary | ICD-10-CM

## 2016-05-23 DIAGNOSIS — Z79899 Other long term (current) drug therapy: Secondary | ICD-10-CM

## 2016-05-23 DIAGNOSIS — R008 Other abnormalities of heart beat: Secondary | ICD-10-CM | POA: Diagnosis present

## 2016-05-23 DIAGNOSIS — Z833 Family history of diabetes mellitus: Secondary | ICD-10-CM

## 2016-05-23 DIAGNOSIS — N179 Acute kidney failure, unspecified: Secondary | ICD-10-CM

## 2016-05-23 DIAGNOSIS — R001 Bradycardia, unspecified: Secondary | ICD-10-CM

## 2016-05-23 DIAGNOSIS — E875 Hyperkalemia: Secondary | ICD-10-CM | POA: Diagnosis present

## 2016-05-23 DIAGNOSIS — Z992 Dependence on renal dialysis: Secondary | ICD-10-CM | POA: Diagnosis not present

## 2016-05-23 DIAGNOSIS — M898X9 Other specified disorders of bone, unspecified site: Secondary | ICD-10-CM | POA: Diagnosis present

## 2016-05-23 DIAGNOSIS — E119 Type 2 diabetes mellitus without complications: Secondary | ICD-10-CM

## 2016-05-23 DIAGNOSIS — E1122 Type 2 diabetes mellitus with diabetic chronic kidney disease: Secondary | ICD-10-CM | POA: Diagnosis present

## 2016-05-23 DIAGNOSIS — Z87891 Personal history of nicotine dependence: Secondary | ICD-10-CM

## 2016-05-23 DIAGNOSIS — C9110 Chronic lymphocytic leukemia of B-cell type not having achieved remission: Secondary | ICD-10-CM | POA: Diagnosis present

## 2016-05-23 DIAGNOSIS — N184 Chronic kidney disease, stage 4 (severe): Secondary | ICD-10-CM | POA: Diagnosis present

## 2016-05-23 DIAGNOSIS — M310 Hypersensitivity angiitis: Secondary | ICD-10-CM | POA: Diagnosis present

## 2016-05-23 DIAGNOSIS — I452 Bifascicular block: Secondary | ICD-10-CM | POA: Diagnosis present

## 2016-05-23 DIAGNOSIS — I959 Hypotension, unspecified: Secondary | ICD-10-CM | POA: Diagnosis present

## 2016-05-23 DIAGNOSIS — Z95828 Presence of other vascular implants and grafts: Secondary | ICD-10-CM

## 2016-05-23 DIAGNOSIS — E8889 Other specified metabolic disorders: Secondary | ICD-10-CM | POA: Diagnosis present

## 2016-05-23 DIAGNOSIS — Z5111 Encounter for antineoplastic chemotherapy: Secondary | ICD-10-CM | POA: Diagnosis not present

## 2016-05-23 DIAGNOSIS — Z7189 Other specified counseling: Secondary | ICD-10-CM

## 2016-05-23 DIAGNOSIS — I10 Essential (primary) hypertension: Secondary | ICD-10-CM | POA: Diagnosis present

## 2016-05-23 LAB — COMPREHENSIVE METABOLIC PANEL
ALBUMIN: 3.7 g/dL (ref 3.5–5.0)
ALBUMIN: 3.7 g/dL (ref 3.5–5.0)
ALT: 25 U/L (ref 17–63)
ALT: 23 U/L (ref 17–63)
ANION GAP: 12 (ref 5–15)
AST: 72 U/L — ABNORMAL HIGH (ref 15–41)
AST: 67 U/L — AB (ref 15–41)
Alkaline Phosphatase: 75 U/L (ref 38–126)
Alkaline Phosphatase: 76 U/L (ref 38–126)
Anion gap: 12 (ref 5–15)
BILIRUBIN TOTAL: 0.4 mg/dL (ref 0.3–1.2)
BUN: 61 mg/dL — AB (ref 6–20)
BUN: 65 mg/dL — AB (ref 6–20)
CHLORIDE: 105 mmol/L (ref 101–111)
CHLORIDE: 105 mmol/L (ref 101–111)
CO2: 17 mmol/L — ABNORMAL LOW (ref 22–32)
CO2: 16 mmol/L — ABNORMAL LOW (ref 22–32)
Calcium: 7.7 mg/dL — ABNORMAL LOW (ref 8.9–10.3)
Calcium: 7.6 mg/dL — ABNORMAL LOW (ref 8.9–10.3)
Creatinine, Ser: 4.26 mg/dL — ABNORMAL HIGH (ref 0.61–1.24)
Creatinine, Ser: 4.45 mg/dL — ABNORMAL HIGH (ref 0.61–1.24)
GFR calc Af Amer: 14 mL/min — ABNORMAL LOW (ref >60)
GFR calc Af Amer: 13 mL/min — ABNORMAL LOW (ref >60)
GFR calc non Af Amer: 12 mL/min — ABNORMAL LOW (ref >60)
GFR, EST NON AFRICAN AMERICAN: 12 mL/min — AB (ref >60)
Glucose, Bld: 277 mg/dL — ABNORMAL HIGH (ref 65–99)
Glucose, Bld: 315 mg/dL — ABNORMAL HIGH (ref 65–99)
POTASSIUM: 7.4 mmol/L — AB (ref 3.5–5.1)
POTASSIUM: 7.1 mmol/L — AB (ref 3.5–5.1)
Sodium: 134 mmol/L — ABNORMAL LOW (ref 135–145)
Sodium: 133 mmol/L — ABNORMAL LOW (ref 135–145)
TOTAL PROTEIN: 7 g/dL (ref 6.5–8.1)
Total Bilirubin: 0.4 mg/dL (ref 0.3–1.2)
Total Protein: 7.2 g/dL (ref 6.5–8.1)

## 2016-05-23 LAB — CBC WITH DIFFERENTIAL/PLATELET
BAND NEUTROPHILS: 0 %
BASOS ABS: 0 10*3/uL (ref 0.0–0.1)
Basophils Absolute: 0 10*3/uL (ref 0.0–0.1)
Basophils Relative: 0 %
Basophils Relative: 0 %
Blasts: 0 %
EOS ABS: 0 10*3/uL (ref 0.0–0.7)
EOS PCT: 0 %
EOS PCT: 0 %
Eosinophils Absolute: 0 10*3/uL (ref 0.0–0.7)
HCT: 27.8 % — ABNORMAL LOW (ref 39.0–52.0)
HEMATOCRIT: 28.4 % — AB (ref 39.0–52.0)
Hemoglobin: 9.2 g/dL — ABNORMAL LOW (ref 13.0–17.0)
Hemoglobin: 9.3 g/dL — ABNORMAL LOW (ref 13.0–17.0)
LYMPHS ABS: 133.8 10*3/uL — AB (ref 0.7–4.0)
Lymphocytes Relative: 90 %
Lymphocytes Relative: 94 %
Lymphs Abs: 128.9 10*3/uL — ABNORMAL HIGH (ref 0.7–4.0)
MCH: 36.2 pg — ABNORMAL HIGH (ref 26.0–34.0)
MCH: 37.2 pg — ABNORMAL HIGH (ref 26.0–34.0)
MCHC: 32.4 g/dL (ref 30.0–36.0)
MCHC: 33.5 g/dL (ref 30.0–36.0)
MCV: 111.8 fL — AB (ref 78.0–100.0)
MCV: 111.2 fL — AB (ref 78.0–100.0)
METAMYELOCYTES PCT: 0 %
MONOS PCT: 0 %
MONOS PCT: 0 %
Monocytes Absolute: 0 10*3/uL — ABNORMAL LOW (ref 0.1–1.0)
Monocytes Absolute: 0 10*3/uL — ABNORMAL LOW (ref 0.1–1.0)
Myelocytes: 0 %
NEUTROS ABS: 14.9 10*3/uL — AB (ref 1.7–7.7)
NEUTROS ABS: 8.2 10*3/uL — AB (ref 1.7–7.7)
Neutrophils Relative %: 10 %
Neutrophils Relative %: 6 %
Other: 0 %
PLATELETS: 141 10*3/uL — AB (ref 150–400)
PLATELETS: 136 10*3/uL — AB (ref 150–400)
Promyelocytes Absolute: 0 %
RBC: 2.54 MIL/uL — ABNORMAL LOW (ref 4.22–5.81)
RBC: 2.5 MIL/uL — ABNORMAL LOW (ref 4.22–5.81)
RDW: 16.3 % — ABNORMAL HIGH (ref 11.5–15.5)
RDW: 16.4 % — AB (ref 11.5–15.5)
WBC: 148.7 10*3/uL (ref 4.0–10.5)
WBC: 137.1 10*3/uL (ref 4.0–10.5)
nRBC: 0 /100 WBC

## 2016-05-23 LAB — I-STAT CHEM 8, ED
BUN: 75 mg/dL — ABNORMAL HIGH (ref 6–20)
Calcium, Ion: 1 mmol/L — ABNORMAL LOW (ref 1.15–1.40)
Chloride: 107 mmol/L (ref 101–111)
Creatinine, Ser: 4.5 mg/dL — ABNORMAL HIGH (ref 0.61–1.24)
GLUCOSE: 296 mg/dL — AB (ref 65–99)
HCT: 30 % — ABNORMAL LOW (ref 39.0–52.0)
Hemoglobin: 10.2 g/dL — ABNORMAL LOW (ref 13.0–17.0)
POTASSIUM: 7.1 mmol/L — AB (ref 3.5–5.1)
SODIUM: 135 mmol/L (ref 135–145)
TCO2: 17 mmol/L (ref 0–100)

## 2016-05-23 LAB — MRSA PCR SCREENING: MRSA by PCR: NEGATIVE

## 2016-05-23 LAB — PHOSPHORUS: Phosphorus: 9.1 mg/dL — ABNORMAL HIGH (ref 2.5–4.6)

## 2016-05-23 LAB — MAGNESIUM: MAGNESIUM: 2.3 mg/dL (ref 1.7–2.4)

## 2016-05-23 LAB — TSH: TSH: 2.324 u[IU]/mL (ref 0.350–4.500)

## 2016-05-23 LAB — URIC ACID: Uric Acid, Serum: <0.5 mg/dL — ABNORMAL LOW (ref 4.4–7.6)

## 2016-05-23 LAB — POTASSIUM: POTASSIUM: 6.6 mmol/L — AB (ref 3.5–5.1)

## 2016-05-23 MED ORDER — DEXAMETHASONE SODIUM PHOSPHATE 100 MG/10ML IJ SOLN: 10.0000 mg | Freq: Once | INTRAMUSCULAR | Status: DC

## 2016-05-23 MED ORDER — HEPARIN SODIUM (PORCINE) 5000 UNIT/ML IJ SOLN
5000.0000 [IU] | Freq: Three times a day (TID) | INTRAMUSCULAR | Status: DC
  Administered 2016-05-23 – 2016-05-28 (×13): 5000 [IU] via SUBCUTANEOUS
  Filled 2016-05-23 (×13): qty 1

## 2016-05-23 MED ORDER — SODIUM BICARBONATE 650 MG PO TABS
650.0000 mg | ORAL_TABLET | Freq: Two times a day (BID) | ORAL | Status: DC
  Administered 2016-05-23 – 2016-05-28 (×10): 650 mg via ORAL
  Filled 2016-05-23 (×10): qty 1

## 2016-05-23 MED ORDER — CHOLECALCIFEROL 10 MCG (400 UNIT) PO TABS
400.0000 [IU] | ORAL_TABLET | Freq: Every day | ORAL | Status: DC
  Administered 2016-05-24 – 2016-05-28 (×5): 400 [IU] via ORAL
  Filled 2016-05-23 (×5): qty 1

## 2016-05-23 MED ORDER — ALLOPURINOL 100 MG PO TABS
100.0000 mg | ORAL_TABLET | Freq: Three times a day (TID) | ORAL | Status: DC
  Administered 2016-05-23: 100 mg via ORAL
  Filled 2016-05-23: qty 1

## 2016-05-23 MED ORDER — LIDOCAINE-PRILOCAINE 2.5-2.5 % EX CREA: 1.0000 "application " | TOPICAL_CREAM | CUTANEOUS | Status: DC | PRN

## 2016-05-23 MED ORDER — DEXTROSE 50 % IV SOLN
INTRAVENOUS | Status: AC
  Filled 2016-05-23: qty 50

## 2016-05-23 MED ORDER — SODIUM POLYSTYRENE SULFONATE 15 GM/60ML PO SUSP
30.0000 g | Freq: Once | ORAL | Status: AC
  Administered 2016-05-23: 30 g via ORAL
  Filled 2016-05-23: qty 120

## 2016-05-23 MED ORDER — COD LIVER OIL W/VIT A & D PO CAPS: 1.0000 | ORAL_CAPSULE | Freq: Every day | ORAL | Status: DC

## 2016-05-23 MED ORDER — HEPARIN SODIUM (PORCINE) 1000 UNIT/ML DIALYSIS: 1000.0000 [IU] | INTRAMUSCULAR | Status: DC | PRN

## 2016-05-23 MED ORDER — ACYCLOVIR 400 MG PO TABS
400.0000 mg | ORAL_TABLET | Freq: Every day | ORAL | Status: DC
  Administered 2016-05-24 – 2016-05-26 (×3): 400 mg via ORAL
  Filled 2016-05-23 (×5): qty 1

## 2016-05-23 MED ORDER — ROSUVASTATIN CALCIUM 20 MG PO TABS
40.0000 mg | ORAL_TABLET | Freq: Every day | ORAL | Status: DC
  Administered 2016-05-23 – 2016-05-27 (×5): 40 mg via ORAL
  Filled 2016-05-23 (×5): qty 2

## 2016-05-23 MED ORDER — ONDANSETRON HCL 4 MG PO TABS: 8.0000 mg | ORAL_TABLET | Freq: Three times a day (TID) | ORAL | Status: DC | PRN

## 2016-05-23 MED ORDER — INSULIN ASPART 100 UNIT/ML IV SOLN
10.0000 [IU] | Freq: Once | INTRAVENOUS | Status: AC
  Administered 2016-05-23: 10 [IU] via INTRAVENOUS

## 2016-05-23 MED ORDER — DEXAMETHASONE SODIUM PHOSPHATE 10 MG/ML IJ SOLN
10.0000 mg | Freq: Once | INTRAMUSCULAR | Status: AC
  Administered 2016-05-23: 10 mg via INTRAVENOUS
  Filled 2016-05-23: qty 1

## 2016-05-23 MED ORDER — SODIUM CHLORIDE 0.9 % IV BOLUS (SEPSIS)
1000.0000 mL | Freq: Once | INTRAVENOUS | Status: AC
  Administered 2016-05-23: 1000 mL via INTRAVENOUS

## 2016-05-23 MED ORDER — SODIUM CHLORIDE 0.9 % IV SOLN: 100.0000 mL | INTRAVENOUS | Status: DC | PRN

## 2016-05-23 MED ORDER — SODIUM CHLORIDE 0.9 % IV SOLN
1.0000 g | Freq: Once | INTRAVENOUS | Status: AC
  Administered 2016-05-23: 1 g via INTRAVENOUS
  Filled 2016-05-23: qty 10

## 2016-05-23 MED ORDER — SODIUM CHLORIDE 0.9 % IV SOLN
45.0000 mg/m2 | Freq: Once | INTRAVENOUS | Status: AC
  Administered 2016-05-23: 75 mg via INTRAVENOUS
  Filled 2016-05-23: qty 3

## 2016-05-23 MED ORDER — ALTEPLASE 2 MG IJ SOLR: 2.0000 mg | Freq: Once | INTRAMUSCULAR | Status: DC | PRN

## 2016-05-23 MED ORDER — SODIUM CHLORIDE 0.9% FLUSH
10.0000 mL | INTRAVENOUS | Status: DC | PRN
  Administered 2016-05-23: 10 mL
  Filled 2016-05-23: qty 10

## 2016-05-23 MED ORDER — HEPARIN SOD (PORK) LOCK FLUSH 100 UNIT/ML IV SOLN
500.0000 [IU] | Freq: Once | INTRAVENOUS | Status: AC | PRN
  Administered 2016-05-23: 500 [IU]
  Filled 2016-05-23: qty 5

## 2016-05-23 MED ORDER — DEXTROSE 50 % IV SOLN
1.0000 | Freq: Once | INTRAVENOUS | Status: AC
  Administered 2016-05-23: 50 mL via INTRAVENOUS

## 2016-05-23 MED ORDER — HEPARIN SODIUM (PORCINE) 1000 UNIT/ML IJ SOLN
INTRAMUSCULAR | Status: AC
  Filled 2016-05-23: qty 1

## 2016-05-23 MED ORDER — ONDANSETRON HCL 4 MG/2ML IJ SOLN: 4.0000 mg | Freq: Three times a day (TID) | INTRAMUSCULAR | Status: AC | PRN

## 2016-05-23 MED ORDER — PENTAFLUOROPROP-TETRAFLUOROETH EX AERO
1.0000 "application " | INHALATION_SPRAY | CUTANEOUS | Status: DC | PRN
  Filled 2016-05-23: qty 30

## 2016-05-23 MED ORDER — LIDOCAINE HCL (PF) 1 % IJ SOLN: 5.0000 mL | INTRAMUSCULAR | Status: DC | PRN

## 2016-05-23 MED ORDER — FUROSEMIDE 10 MG/ML IJ SOLN
40.0000 mg | INTRAMUSCULAR | Status: AC
  Administered 2016-05-23: 40 mg via INTRAVENOUS
  Filled 2016-05-23: qty 4

## 2016-05-23 MED ORDER — SODIUM CHLORIDE 0.9 % IV SOLN
Freq: Once | INTRAVENOUS | Status: AC
  Administered 2016-05-23: 11:00:00 via INTRAVENOUS

## 2016-05-23 MED ORDER — INSULIN ASPART 100 UNIT/ML ~~LOC~~ SOLN
SUBCUTANEOUS | Status: AC
  Filled 2016-05-23: qty 1

## 2016-05-23 NOTE — Progress Notes (Signed)
Patient is here for day 2 of treatment.  Nursing reported bradycardia below baseline.  Therefore EKG was performed showing a HR of 56 which is near baseline.  He completed day 2 of cycle #1 of treatment today without any issues.  He was discharged from treatment area.    EKG reviewed with Cardiology and when compared to previous EKG, it was deemed stable.  Patient was referred to cardiology to establish cardiac care as an outpatient.  He was sent to the lab for electrolyte labs.  When they returned, he was advised to report back to the clinic for review and escorted to ED.  Of note, due to his poor baseline renal function, Bendamustine was significantly reduced (~50%).  He tolerated day 1 of cycle 1 well with some minor reaction to Rituxan which is not surprising but this was managed well with appropriate protocol and he completed treatment as planned.  Of note, he started Allopurinol >7 days prior to start of treatment.  He did receive Rasburicase yesterday (day1 cycle1) with normal uric acid to prevent tumor lysis syndrome.  He returned to the clinic and we reviewed his labs.  He is asymptomatic.  Vitals - 1 value per visit [id number]  SYSTOLIC 83  DIASTOLIC 36  Pulse 32  Temperature 97.6  Respirations 16   Gen: NAD, pleasant Skin: Warm and dry Cardiac: RRR Lungs: CTA B/L Neuro: A and O x 3.  Assessment: 1. Acute renal failure 2. Hyperkalemia  Plan: 1. EKG completed 2. Discussed EKG results with cardiology 3. Will refer patient to cardiology as an outpatient. 4. Patient escorted to ED. 5. Case discussed with Dr. Sabra Heck in ED.  Patient and plan discussed with Dr. Ancil Linsey and she is in agreement with the aforementioned.   Robynn Pane, PA-C 05/23/2016 2:49 PM

## 2016-05-23 NOTE — ED Notes (Signed)
EDP provided with print out of I stat chem 8 results

## 2016-05-23 NOTE — Progress Notes (Signed)
CRITICAL VALUE ALERT Critical value received:  K+- 7.4 Date of notification:  05/23/16 Time of notification: [id number] Critical value read back:  Yes.   Nurse who received alert:  M.Chelsey Kimberley, LPN  MD notified (1st page):  [place], PA-C  PA-C wants patient to come straight to ER. Attempted to call patient and wife at all numbers listed and was unable to reach anyone. Left message on home phone for patient to call cancer center as soon as possible. Finally reached daughter in law , who lives next door. She states their car is home and she will go next door and tell them to come to cancer center. Dr. [name] also reviewed labs and said for patient to return to cancer center.

## 2016-05-23 NOTE — H&P (Signed)
History and Physical    Randall Reyes [id number] DOB: [birth year] DOA: 05/23/2016  PCP: [name], MD  Patient coming from: Home.    Chief Complaint:  Abnormal lab called back, hyperkalemia.   HPI: Randall Reyes is an 81 y.o. male with hx of CLL, with WBC in the 200K, started chemotherapy, hx of CKD, HTN, DM, HTN, port-a-cath in place, found to have K of 7.4 with routine labs.  He has no CP, palpitation, or lightheadedness.  In the ER, repeat labs showed K of 7.1 with Cr of 4.0.  EKG showed bradycardia with bigeminy.  Dr Theador Hawthorne of nephrology was consulted.   He was given D50 and insulin along with calcium.  Repeat K was still 6.6, and hospitalist was asked to admit him for acute hyperkalemia with AKI, EKG changes and needing emergent hemodialysis.     ED Course:  See above.  Rewiew of Systems:  Constitutional: Negative for malaise, fever and chills. No significant weight loss or weight gain Eyes: Negative for eye pain, redness and discharge, diplopia, visual changes, or flashes of light. ENMT: Negative for ear pain, hoarseness, nasal congestion, sinus pressure and sore throat. No headaches; tinnitus, drooling, or problem swallowing. Cardiovascular: Negative for chest pain, palpitations, diaphoresis, dyspnea and peripheral edema. ; No orthopnea, PND Respiratory: Negative for cough, hemoptysis, wheezing and stridor. No pleuritic chestpain. Gastrointestinal: Negative for diarrhea, constipation,  melena, blood in stool, hematemesis, jaundice and rectal bleeding.    Genitourinary: Negative for frequency, dysuria, incontinence,flank pain and hematuria; Musculoskeletal: Negative for back pain and neck pain. Negative for swelling and trauma.;  Skin: . Negative for pruritus, rash, abrasions, bruising and skin lesion.; ulcerations Neuro: Negative for headache, lightheadedness and neck stiffness. Negative for weakness, altered level of consciousness , altered mental status, extremity  weakness, burning feet, involuntary movement, seizure and syncope.  Psych: negative for anxiety, depression, insomnia, tearfulness, panic attacks, hallucinations, paranoia, suicidal or homicidal ideation    Past Medical History:  Diagnosis Date  . Anemia   . B12 deficiency 12/07/2014  . CLL (chronic lymphocytic leukemia) ([place]) 01/24/2011  . Diabetes mellitus   . DM (diabetes mellitus) ([place]) 01/24/2011  . Eczema 01/24/2011  . History of pneumonia 04/2014  . Hypertension   . Leukemia ([place]) 6.22.2012   PER PATIENT  . Melanoma in situ ([place]) 01/24/2011  . Mild obesity 01/24/2011  . Port catheter in place 05/02/2012  . Shingles   . Ulcer [facility])     Past Surgical History:  Procedure Laterality Date  . bleeding ulcer    . CATARACT EXTRACTION W/PHACO  05/05/2012   Procedure: CATARACT EXTRACTION PHACO AND INTRAOCULAR LENS PLACEMENT (IOC);  Surgeon: Tonny Branch, MD;  Location: [facility] ORS;  Service: Ophthalmology;  Laterality: Right;  CDE:13.25  . CATARACT EXTRACTION W/PHACO  05/15/2012   Procedure: CATARACT EXTRACTION PHACO AND INTRAOCULAR LENS PLACEMENT (IOC);  Surgeon: Tonny Branch, MD;  Location: [facility] ORS;  Service: Ophthalmology;  Laterality: Left;  CDE:  12.32  . COLONOSCOPY N/A 01/30/2016   Procedure: COLONOSCOPY;  Surgeon: Danie Binder, MD;  Location: [facility] ENDO SUITE;  Service: Endoscopy;  Laterality: N/A;  2:00 PM - moved to 1:00 - office notified pt  . ESOPHAGOGASTRODUODENOSCOPY  2008   Dr. Oneida Alar: normal esophagus, antral erythema, 1 cm clean based duodenal ulcera, negative H. plyori  . ESOPHAGOGASTRODUODENOSCOPY N/A 01/30/2016   Procedure: ESOPHAGOGASTRODUODENOSCOPY (EGD);  Surgeon: Danie Binder, MD;  Location: [facility];  Service: Endoscopy;  Laterality: N/A;  . HERNIA REPAIR  2001  . PORTACATH PLACEMENT  2008     reports that he has quit smoking. He quit smokeless tobacco use about 49 years ago. He reports that he does not drink alcohol or use drugs.  Allergies  Allergen Reactions  .  Aspirin Other (See Comments)    Bleeding ulcers.   . Ibuprofen Other (See Comments)    Bleeding ulcers.    Family History  Problem Relation Age of Onset  . Diabetes Father   . Colon cancer Neg Hx      Prior to Admission medications   Medication Sig Start Date End Date Taking? Authorizing Provider  acyclovir (ZOVIRAX) 400 MG tablet Take 1 tablet (400 mg total) by mouth daily. 05/18/16  Yes Patrici Ranks, MD  allopurinol (ZYLOPRIM) 100 MG tablet Take 1 tablet (100 mg total) by mouth 3 (three) times daily. 05/09/16  Yes Baird Cancer, PA-C  cholecalciferol (VITAMIN D) 400 units TABS tablet Take 400 Units by mouth daily.   Yes Historical Provider, MD  [facility] Liver Oil w/Vit A & D CAPS Take 1 capsule by mouth daily.   Yes Historical Provider, MD  fluocinonide cream (LIDEX) 0.05 % APPLY TO AFFECTED AREA UP TO TWICE A DAY AS NEEDED NOT TO FACE, GROIN, UNDER ARMS 11/25/14  Yes Historical Provider, MD  IRON PO Take 1 tablet by mouth daily.   Yes Historical Provider, MD  lidocaine-prilocaine (EMLA) cream Apply a quarter size amount to affected area 1 hour prior to coming to chemotherapy. 05/10/16  Yes Patrici Ranks, MD  ondansetron (ZOFRAN) 8 MG tablet Take 1 tablet (8 mg total) by mouth every 8 (eight) hours as needed for nausea or vomiting. 05/10/16  Yes Patrici Ranks, MD  prochlorperazine (COMPAZINE) 10 MG tablet Take 1 tablet (10 mg total) by mouth every 6 (six) hours as needed for nausea or vomiting. 05/10/16  Yes Patrici Ranks, MD  rosuvastatin (CRESTOR) 40 MG tablet Take 40 mg by mouth at bedtime.  02/21/16  Yes Historical Provider, MD  sitaGLIPtin (JANUVIA) 100 MG tablet Take 100 mg by mouth daily.   Yes Historical Provider, MD  sodium bicarbonate 650 MG tablet TAKE 1 TABLET BY MOUTH TWICE A DAY AS DIRECTED 04/24/16  Yes Historical Provider, MD  [place] test strip  10/14/15   Historical Provider, MD  ACCU-CHEK SOFTCLIX LANCETS lancets  11/30/15   Historical Provider,  MD  acetaminophen (TYLENOL) 500 MG tablet Take 500 mg by mouth at bedtime as needed.     Historical Provider, MD  bendamustine in sodium chloride 0.9 % 50 mL Inject into the vein once.    Historical Provider, MD  RiTUXimab (RITUXAN IV) Inject into the vein.    Historical Provider, MD    Physical Exam: Vitals:   05/23/16 1630 05/23/16 1645 05/23/16 1700 05/23/16 1830  BP: (!) 102/41  99/55 100/63  Pulse: (!) 36 79 (!) 57   Resp: 14 22 18 18   SpO2: 96% 99% 98%   Weight:      Height:       Constitutional: NAD, calm, comfortable Vitals:   05/23/16 1630 05/23/16 1645 05/23/16 1700 05/23/16 1830  BP: (!) 102/41  99/55 100/63  Pulse: (!) 36 79 (!) 57   Resp: 14 22 18 18   SpO2: 96% 99% 98%   Weight:      Height:       Eyes: PERRL, lids and conjunctivae normal ENMT: Mucous membranes are moist. Posterior pharynx clear of any exudate  or lesions.Normal dentition.  Neck: normal, supple, no masses, no thyromegaly.  Port in place.  Respiratory: clear to auscultation bilaterally, no wheezing, no crackles. Normal respiratory effort. No accessory muscle use.  Cardiovascular: Regular rate and rhythm, no murmurs / rubs / gallops. No extremity edema. 2+ pedal pulses. No carotid bruits.  Abdomen: no tenderness, no masses palpated. No hepatosplenomegaly. Bowel sounds positive.  Musculoskeletal: no clubbing / cyanosis. No joint deformity upper and lower extremities. Good ROM, no contractures. Normal muscle tone.  Skin: no rashes, lesions, ulcers. No induration Neurologic: CN 2-12 grossly intact. Sensation intact, DTR normal. Strength 5/5 in all 4.  Psychiatric: Normal judgment and insight. Alert and oriented x 3. Normal mood.    Labs on Admission: I have personally reviewed following labs and imaging studies  CBC:  Recent Labs Lab 05/23/16 1315 05/23/16 1534 05/23/16 1545  WBC 148.7* 137.1*  --   NEUTROABS 14.9* 8.2*  --   HGB 9.2* 9.3* 10.2*  HCT 28.4* 27.8* 30.0*  MCV 111.8* 111.2*  --     PLT 141* 136*  --    Basic Metabolic Panel:  Recent Labs Lab 05/23/16 1315 05/23/16 1534 05/23/16 1545 05/23/16 1731  NA 134* 133* 135  --   K 7.4* 7.1* 7.1* 6.6*  CL 105 105 107  --   CO2 17* 16*  --   --   GLUCOSE 277* 315* 296*  --   BUN 61* 65* 75*  --   CREATININE 4.26* 4.45* 4.50*  --   CALCIUM 7.7* 7.6*  --   --   MG 2.3  --   --   --   PHOS  --  9.1*  --   --    GFR: Estimated Creatinine Clearance: 10 mL/min (by C-G formula based on SCr of 4.5 mg/dL (H)). Liver Function Tests:  Recent Labs Lab 05/23/16 1315 05/23/16 1534  AST 72* 67*  ALT 25 23  ALKPHOS 75 76  BILITOT 0.4 0.4  PROT 7.0 7.2  ALBUMIN 3.7 3.7   Urine analysis:    Component Value Date/Time   COLORURINE YELLOW 02/11/2015 1718   APPEARANCEUR CLEAR 02/11/2015 1718   LABSPEC 1.015 02/11/2015 1718   PHURINE 6.0 02/11/2015 1718   GLUCOSEU 250 (A) 02/11/2015 1718   HGBUR NEGATIVE 02/11/2015 1718   BILIRUBINUR NEGATIVE 02/11/2015 1718   KETONESUR NEGATIVE 02/11/2015 1718   PROTEINUR NEGATIVE 02/11/2015 1718   UROBILINOGEN 0.2 02/11/2015 1718   NITRITE NEGATIVE 02/11/2015 1718   LEUKOCYTESUR NEGATIVE 02/11/2015 1718   EKG: Independently reviewed.  Assessment/Plan Principal Problem:   Hyperkalemia Active Problems:   CLL (chronic lymphocytic leukemia) ([facility])   DM (diabetes mellitus) ([place])   Port catheter in place   AKI (acute kidney injury) ([place])   HTN (hypertension)   Bigeminy    PLAN:   AKI with hyperkalemia:  I have consulted Dr Arnoldo Morale and he had placed a temp dialysis catheter.  He will need urgent dialysis.  Nephrology has been consulted and will follow him closely.  Follow up K carefully and continue with telemetry.  Repeat EKG after dialysis.  His rhythm is improving and his HR is in the 50-60 at this time.   CLL:  Stable.  WBC has improved with chemotherapy already.  Avoid hyperviscosity syndrome.   Use subQ heparin for DVT prophylaxis.  HTN:  Stable.   DM:  Continue with  SSI.     DVT prophylaxis: Heparin SQ.  Code Status: FULL CODE.  Confirmed.  Family Communication: son  and sister at bedside.  Disposition Plan: To home when appropriate.  Consults called: Surgery for dialysis catheter.  Nephrology for urgent dialysis.  Admission status: Inpatient.    Jany Buckwalter MD FACP. [facility]  If 7PM-7AM, please contact night-coverage [email or web address] Password [facility]  05/23/2016, 6:37 PM

## 2016-05-23 NOTE — ED Notes (Signed)
Dr. Marin Comment aware of pt's K 6.6

## 2016-05-23 NOTE — ED Triage Notes (Addendum)
Pt had chemo yesterday and today today and blood work showed hyperkalemia and abnormal kidney function.  Pt alert, oriented, and pleasant.  Reports generalized weakness and dizziness.

## 2016-05-23 NOTE — Consult Note (Signed)
Randall Reyes MRN: [id number] DOB/AGE: 01/30/1936 81 y.o. Primary Care Physician:Randall Maudie Mercury, MD Admit date: 05/23/2016 Chief Complaint:  Chief Complaint  Patient presents with  . hyperkalemia   HPI: Patient is a 81 year old male with a past medical CKD,Chronic lymphocytic leukemia, who was sent from cancer center secondary to High optassium.  HPI dates back to past few days when pt started on his second dose of chemotherapy . [name]. Pt also c/o feeling dizzy. Pt upon evaluation in cancer center was found to be bradycardiac and sen to ER. In ER pt was found to have AKI and hyperkalemia and Nephrology was consulted. Patient c/o Nausea, had been feeling dizzy, . Pt denies chest pain /shortness of breath/ abdominal pain / swelling Pt offer no c/o  any fevers or chills.  NO c/o cough   Past Medical History:  Diagnosis Date  . Anemia   . B12 deficiency 12/07/2014  . CLL (chronic lymphocytic leukemia) ([place]) 01/24/2011  . Diabetes mellitus   . DM (diabetes mellitus) ([place]) 01/24/2011  . Eczema 01/24/2011  . History of pneumonia 04/2014  . Hypertension   . Leukemia ([place]) 6.22.2012   PER PATIENT  . Melanoma in situ ([place]) 01/24/2011  . Mild obesity 01/24/2011  . Port catheter in place 05/02/2012  . Shingles   . Ulcer ([place])         Family History  Problem Relation Age of Onset  . Diabetes Father   . Colon cancer Neg Hx     Social History:  reports that he has quit smoking. He quit smokeless tobacco use about 49 years ago. He reports that he does not drink alcohol or use drugs.   Allergies:  Allergies  Allergen Reactions  . Aspirin Other (See Comments)    Bleeding ulcers.   . Ibuprofen Other (See Comments)    Bleeding ulcers.     (Not in a hospital admission)     [id number] from the symptoms mentioned above,there are no other symptoms referable to all systems reviewed.   Physical Exam: Vital signs in last 24  hours: Temp:  [97.6 F (36.4 C)-98 F (36.7 C)] 97.6 F (36.4 C) (01/10 1204) Pulse Rate:  [27-79] 57 (01/10 1700) Resp:  [14-22] 18 (01/10 1700) BP: (83-102)/(36-61) 99/55 (01/10 1700) SpO2:  [92 %-100 %] 98 % (01/10 1700) Weight:  [138 lb (62.6 kg)] 138 lb (62.6 kg) (01/10 1529) Weight change:     Intake/Output from previous day: No intake/output data recorded. Total I/O In: 1110 [I.V.:1000; IV Piggyback:110] Out: 50 [Urine:50]   Physical Exam: General- pt is awake,alert, oriented to time place and person Resp- No acute REsp distress, CTA B/L NO Rhonchi, port in situ CVS- S1S2 irregular in rate and rhythm GIT- BS+, soft, NT, ND EXT- NO LE Edema, Cyanosis CNS- CN 2-12 grossly intact. Moving all 4 extremities Psych- normal mood and affect    Lab Results: CBC  Recent Labs  05/23/16 1315 05/23/16 1534 05/23/16 1545  WBC 148.7* 137.1*  --   HGB 9.2* 9.3* 10.2*  HCT 28.4* 27.8* 30.0*  PLT 141* 136*  --     BMET  Recent Labs  05/23/16 1315 05/23/16 1534 05/23/16 1545 05/23/16 1731  NA 134* 133* 135  --   K 7.4* 7.1* 7.1* 6.6*  CL 105 105 107  --   CO2 17* 16*  --   --   GLUCOSE 277* 315* 296*  --   BUN  61* 65* 75*  --   CREATININE 4.26* 4.45* 4.50*  --   CALCIUM 7.7* 7.6*  --   --     Creat trend 2018  4.45 2017  1.7--2.0 2016  1.5--2.6 2015  1.4--2.2 2014  1.2--1.5 2013  1.2--1.4 2012  1.3--2.0 2011  1.3 2010  1.3--2.2 2008   1.37       Lab Results  Component Value Date   PTH 39 09/13/2015   CALCIUM 7.6 (L) 05/23/2016   CAION 1.00 (L) 05/23/2016   PHOS 3.5 02/29/2016      Impression: 1)Renal  AKI secondary to prerenal/ATN/ AIN                ATN - ? oliguric                AKI on CKD               CKD stage 3/4.               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                 Nephrolithiasis Hx  Absent     2)HTN  Medication- On Diuretics  3)Anemia HGb at goal (9--11)   4)CKD Mineral-Bone Disorder PTH acceptable. Secondary Hyperparathyroidism absent. Phosphorus at goal.   5)Hyperkalemia    Sec to AKI on CKD.    6)[facility]- admitted with bradycardia     Pt has hx of Junctional rhtythm  7)Acid base Co2 at goal     Plan:  Will ask for Phos Will ask for stat BMet Will ask for renal u/s Will ask for FENA   Addendum After medical tx K is still high Pt does not have much urine output Will ask for temporary access Will start HD    Cordarrell Sane S 05/23/2016, 6:15 PM

## 2016-05-23 NOTE — Patient Instructions (Signed)
[facility] Discharge Instructions for Patients Receiving Chemotherapy   Beginning January 23rd 2017 lab work for the [facility] will be done in the  Main lab at [facility] on 1st floor. If you have a lab appointment with the [facility] please come in thru the  Main Entrance and check in at the main information desk   Today you received the following chemotherapy agent: Bendamustine.     If you develop nausea and vomiting, or diarrhea that is not controlled by your medication, call the clinic.  The clinic phone number is [phone]. Office hours are Monday-Friday 8:30am-5:00pm.  BELOW ARE SYMPTOMS THAT SHOULD BE REPORTED IMMEDIATELY:  *FEVER GREATER THAN 101.0 F  *CHILLS WITH OR WITHOUT FEVER  NAUSEA AND VOMITING THAT IS NOT CONTROLLED WITH YOUR NAUSEA MEDICATION  *UNUSUAL SHORTNESS OF BREATH  *UNUSUAL BRUISING OR BLEEDING  TENDERNESS IN MOUTH AND THROAT WITH OR WITHOUT PRESENCE OF ULCERS  *URINARY PROBLEMS  *BOWEL PROBLEMS  UNUSUAL RASH Items with * indicate a potential emergency and should be followed up as soon as possible. If you have an emergency after office hours please contact your primary care physician or go to the nearest emergency department.  Please call the clinic during office hours if you have any questions or concerns.   You may also contact the Patient Navigator at [phone] should you have any questions or need assistance in obtaining follow up care.      Resources For Cancer Patients and their Caregivers ? American Cancer Society: Can assist with transportation, wigs, general needs, runs Look Good Feel Better.        615-161-3130 ? Cancer Care: Provides financial assistance, online support groups, medication/co-pay assistance.  1-800-813-HOPE 339 779 2551) ? [place] Assists [place] cancer patients and their families through emotional , educational and financial support.   (445)126-2712 ? [place] DSS Where to apply for food stamps, Medicaid and utility assistance. (207) 240-8383 ? [facility]: Transportation to medical appointments. 878-183-4606 ? Social Security Administration: May apply for disability if have a Stage IV cancer. 2054475021 (430)233-8909 ? [employer], Disability and Transit Services: Assists with nutrition, care and transit needs. 587-278-8244

## 2016-05-23 NOTE — ED Provider Notes (Signed)
[facility] DEPT Provider Note   CSN: [id number] Arrival date & time: 05/23/16  1453     History   Chief Complaint Chief Complaint  Patient presents with  . hyperkalemia    HPI Randall Reyes is a 81 y.o. male.  HPI   the patient is a 81 year old male with a history of renal insufficiency as well as chronic lymphocytic leukemia, he presents from the cancer center after receiving his second dose of chemotherapy in back-to-back days. According to the provider at the cancer center the patient had been having some nausea, had been feeling dizzy, blood work was drawn today and found that his creatinine had doubled from 2-4 as well as a potassium that had increased as high as 7.5. The patient was sent to the emergency department immediately for further evaluation. On arrival the patient does not have any significant complaints other than feeling a little bit dizzy and nauseated. He denies chest pain shortness of breath abdominal pain or swelling. He does have a port in the right upper chest, he did get chemotherapy today. He denies any fevers or chills.   The initial EKG in the cancer center was obtained today because of bradycardia where his blood pressure was 83/36 and his heart rate was 32. The EKG did not show any significant findings Heart rate.  Past Medical History:  Diagnosis Date  . Anemia   . B12 deficiency 12/07/2014  . CLL (chronic lymphocytic leukemia) ([place]) 01/24/2011  . Diabetes mellitus   . DM (diabetes mellitus) ([place]) 01/24/2011  . Eczema 01/24/2011  . History of pneumonia 04/2014  . Hypertension   . Leukemia ([place]) 6.22.2012   PER PATIENT  . Melanoma in situ ([place]) 01/24/2011  . Mild obesity 01/24/2011  . Port catheter in place 05/02/2012  . Shingles   . Ulcer [facility])     Patient Active Problem List   Diagnosis Date Noted  . Goals of care, counseling/discussion 05/22/2016  . Anemia 01/20/2016  . Special screening for malignant neoplasms, colon 01/20/2016  .  HTN (hypertension) 02/01/2015  . B12 deficiency 12/07/2014  . Malnutrition of moderate degree ([place]) 08/11/2014  . AKI (acute kidney injury) ([place]) 08/10/2014  . Hyperkalemia 08/10/2014  . Leukocytosis 110-30-2015  . Port catheter in place 05/02/2012  . CLL (chronic lymphocytic leukemia) ([place]) 01/24/2011  . Melanoma in situ ([place]) 01/24/2011  . DM (diabetes mellitus) ([place]) 01/24/2011  . Mild obesity 01/24/2011  . Eczema 01/24/2011    Past Surgical History:  Procedure Laterality Date  . bleeding ulcer    . CATARACT EXTRACTION W/PHACO  05/05/2012   Procedure: CATARACT EXTRACTION PHACO AND INTRAOCULAR LENS PLACEMENT (IOC);  Surgeon: Tonny Branch, MD;  Location: [facility] ORS;  Service: Ophthalmology;  Laterality: Right;  CDE:13.25  . CATARACT EXTRACTION W/PHACO  05/15/2012   Procedure: CATARACT EXTRACTION PHACO AND INTRAOCULAR LENS PLACEMENT (IOC);  Surgeon: Tonny Branch, MD;  Location: [facility] ORS;  Service: Ophthalmology;  Laterality: Left;  CDE:  12.32  . COLONOSCOPY N/A 01/30/2016   Procedure: COLONOSCOPY;  Surgeon: Danie Binder, MD;  Location: [facility] ENDO SUITE;  Service: Endoscopy;  Laterality: N/A;  2:00 PM - moved to 1:00 - office notified pt  . ESOPHAGOGASTRODUODENOSCOPY  2008   Dr. Oneida Alar: normal esophagus, antral erythema, 1 cm clean based duodenal ulcera, negative H. plyori  . ESOPHAGOGASTRODUODENOSCOPY N/A 01/30/2016   Procedure: ESOPHAGOGASTRODUODENOSCOPY (EGD);  Surgeon: Danie Binder, MD;  Location: [facility] ENDO SUITE;  Service: Endoscopy;  Laterality: N/A;  . HERNIA REPAIR  2001  .  PORTACATH PLACEMENT  2008      Home Medications    Prior to Admission medications   Medication Sig Start Date End Date Taking? Authorizing Provider  acyclovir (ZOVIRAX) 400 MG tablet Take 1 tablet (400 mg total) by mouth daily. 05/18/16  Yes Patrici Ranks, MD  allopurinol (ZYLOPRIM) 100 MG tablet Take 1 tablet (100 mg total) by mouth 3 (three) times daily. 05/09/16  Yes Baird Cancer, PA-C  cholecalciferol  (VITAMIN D) 400 units TABS tablet Take 400 Units by mouth daily.   Yes Historical Provider, MD  [facility] Liver Oil w/Vit A & D CAPS Take 1 capsule by mouth daily.   Yes Historical Provider, MD  fluocinonide cream (LIDEX) 0.05 % APPLY TO AFFECTED AREA UP TO TWICE A DAY AS NEEDED NOT TO FACE, GROIN, UNDER ARMS 11/25/14  Yes Historical Provider, MD  IRON PO Take 1 tablet by mouth daily.   Yes Historical Provider, MD  lidocaine-prilocaine (EMLA) cream Apply a quarter size amount to affected area 1 hour prior to coming to chemotherapy. 05/10/16  Yes Patrici Ranks, MD  ondansetron (ZOFRAN) 8 MG tablet Take 1 tablet (8 mg total) by mouth every 8 (eight) hours as needed for nausea or vomiting. 05/10/16  Yes Patrici Ranks, MD  prochlorperazine (COMPAZINE) 10 MG tablet Take 1 tablet (10 mg total) by mouth every 6 (six) hours as needed for nausea or vomiting. 05/10/16  Yes Patrici Ranks, MD  rosuvastatin (CRESTOR) 40 MG tablet Take 40 mg by mouth at bedtime.  02/21/16  Yes Historical Provider, MD  sitaGLIPtin (JANUVIA) 100 MG tablet Take 100 mg by mouth daily.   Yes Historical Provider, MD  sodium bicarbonate 650 MG tablet TAKE 1 TABLET BY MOUTH TWICE A DAY AS DIRECTED 04/24/16  Yes Historical Provider, MD  [place] test strip  10/14/15   Historical Provider, MD  ACCU-CHEK SOFTCLIX LANCETS lancets  11/30/15   Historical Provider, MD  acetaminophen (TYLENOL) 500 MG tablet Take 500 mg by mouth at bedtime as needed.     Historical Provider, MD  bendamustine in sodium chloride 0.9 % 50 mL Inject into the vein once.    Historical Provider, MD  RiTUXimab (RITUXAN IV) Inject into the vein.    Historical Provider, MD    Family History Family History  Problem Relation Age of Onset  . Diabetes Father   . Colon cancer Neg Hx     Social History Social History  Substance Use Topics  . Smoking status: Former Research scientist (life sciences)  . Smokeless tobacco: Former Systems developer    Quit date: 04/25/1967  . Alcohol use No      Allergies   Aspirin and Ibuprofen   Review of Systems Review of Systems  All other systems reviewed and are negative.    Physical Exam Updated Vital Signs BP 99/55   Pulse (!) 57   Resp 18   Ht 5\' 1"  (1.549 m)   Wt 138 lb (62.6 kg)   SpO2 98%   BMI 26.07 kg/m   Physical Exam  Constitutional: He appears well-developed and well-nourished. No distress.  HENT:  Head: Normocephalic and atraumatic.  Mouth/Throat: Oropharynx is clear and moist. No oropharyngeal exudate.  Eyes: Conjunctivae and EOM are normal. Pupils are equal, round, and reactive to light. Right eye exhibits no discharge. Left eye exhibits no discharge. No scleral icterus.  Neck: Normal range of motion. Neck supple. No JVD present. No thyromegaly present.  Cardiovascular: Regular rhythm, normal heart sounds and intact  distal pulses.  Exam reveals no gallop and no friction rub.   No murmur heard. Mild bradycardia  Pulmonary/Chest: Effort normal and breath sounds normal. No respiratory distress. He has no wheezes. He has no rales.  Abdominal: Soft. Bowel sounds are normal. He exhibits no distension and no mass. There is no tenderness.  Musculoskeletal: Normal range of motion. He exhibits edema ( mild LE edema). He exhibits no tenderness.  Lymphadenopathy:    He has no cervical adenopathy.  Neurological: He is alert. Coordination normal.  Skin: Skin is warm and dry. No rash noted. No erythema.  Psychiatric: He has a normal mood and affect. His behavior is normal.  Nursing note and vitals reviewed.    ED Treatments / Results  Labs (all labs ordered are listed, but only abnormal results are displayed) Labs Reviewed  CBC WITH DIFFERENTIAL/PLATELET - Abnormal; Notable for the following:       Result Value   WBC 137.1 (*)    RBC 2.50 (*)    Hemoglobin 9.3 (*)    HCT 27.8 (*)    MCV 111.2 (*)    MCH 37.2 (*)    RDW 16.4 (*)    Platelets 136 (*)    Neutro Abs 8.2 (*)    Lymphs Abs 128.9 (*)     Monocytes Absolute 0.0 (*)    All other components within normal limits  COMPREHENSIVE METABOLIC PANEL - Abnormal; Notable for the following:    Sodium 133 (*)    Potassium 7.1 (*)    CO2 16 (*)    Glucose, Bld 315 (*)    BUN 65 (*)    Creatinine, Ser 4.45 (*)    Calcium 7.6 (*)    AST 67 (*)    GFR calc non Af Amer 12 (*)    GFR calc Af Amer 13 (*)    All other components within normal limits  I-STAT CHEM 8, ED - Abnormal; Notable for the following:    Potassium 7.1 (*)    BUN 75 (*)    Creatinine, Ser 4.50 (*)    Glucose, Bld 296 (*)    Calcium, Ion 1.00 (*)    Hemoglobin 10.2 (*)    HCT 30.0 (*)    All other components within normal limits  POTASSIUM  CREATININE, URINE, RANDOM  SODIUM, URINE, RANDOM  PHOSPHORUS    EKG  EKG Interpretation  Date/Time:  Wednesday May 23 2016 15:47:22 EST Ventricular Rate:  127 PR Interval:    QRS Duration: 128 QT Interval:  259 QTC Calculation: 261 R Axis:   -71 Text Interpretation:  Atrial fibrillation Paired ventricular premature complexes RBBB and LAFB Nonspecific T abnormalities, lateral leads Since last tracing rate slower Confirmed by Maicee Ullman  MD, Shai Mckenzie ([id number]) on 05/23/2016 4:56:54 PM       EKG performed earlier in the day showed some flattening of the P waves, baseline right bundle branch block. The patient had hyperkalemia on labs however this could've been an abnormal hemolyzed sample thus this will be repeated. He will be given calcium, the patient does not appear acutely ill and does not appear hemodynamically unstable at this time. Currently his heart rate is between 50 and 60, his blood pressure is consistent with plantar blood pressures were he is low.  D/w Dr. Theador Hawthorne - consulted for possible dialysis D/w Dr. Marin Comment - will admit Pt needs high level of care  Has had insulin / D 50 Calcium  HR remains in the 30's  CRITICAL CARE  Performed by: Johnna Acosta Total critical care time: 35 minutes Critical care time was  exclusive of separately billable procedures and treating other patients. Critical care was necessary to treat or prevent imminent or life-threatening deterioration. Critical care was time spent personally by me on the following activities: development of treatment plan with patient and/or surrogate as well as nursing, discussions with consultants, evaluation of patient's response to treatment, examination of patient, obtaining history from patient or surrogate, ordering and performing treatments and interventions, ordering and review of laboratory studies, ordering and review of radiographic studies, pulse oximetry and re-evaluation of patient's condition.   Radiology No results found.  Procedures Procedures (including critical care time)  Medications Ordered in ED Medications  sodium chloride 0.9 % bolus 1,000 mL (0 mLs Intravenous Stopped 05/23/16 1741)  calcium chloride 1 g in sodium chloride 0.9 % 100 mL IVPB (0 g Intravenous Stopped 05/23/16 1729)  insulin aspart (novoLOG) injection 10 Units (10 Units Intravenous Given 05/23/16 1613)  dextrose 50 % solution 50 mL (50 mLs Intravenous Given 05/23/16 1618)  furosemide (LASIX) injection 40 mg (40 mg Intravenous Given 05/23/16 1634)  sodium polystyrene (KAYEXALATE) 15 GM/60ML suspension 30 g (30 g Oral Given 05/23/16 1634)     Initial Impression / Assessment and Plan / ED Course  I have reviewed the triage vital signs and the nursing notes.  Pertinent labs & imaging results that were available during my care of the patient were reviewed by me and considered in my medical decision making (see chart for details).  CRITICAL CARE Performed by: Johnna Acosta Total critical care time: 35 minutes Critical care time was exclusive of separately billable procedures and treating other patients. Critical care was necessary to treat or prevent imminent or life-threatening deterioration. Critical care was time spent personally by me on the following  activities: development of treatment plan with patient and/or surrogate as well as nursing, discussions with consultants, evaluation of patient's response to treatment, examination of patient, obtaining history from patient or surrogate, ordering and performing treatments and interventions, ordering and review of laboratory studies, ordering and review of radiographic studies, pulse oximetry and re-evaluation of patient's condition.   Final Clinical Impressions(s) / ED Diagnoses   Final diagnoses:  Hyperkalemia  Acute renal failure, unspecified acute renal failure type [facility])    New Prescriptions New Prescriptions   No medications on file     Noemi Chapel, MD 05/23/16 1803

## 2016-05-23 NOTE — ED Notes (Signed)
CRITICAL VALUE ALERT  Critical value received:  WBC 137.1  Date of notification:  05/23/16  Time of notification:  [id number]  Critical value read back:Yes.    Nurse who received alert:  Charmayne Sheer, RN  MD notified (1st page):  Sabra Heck

## 2016-05-23 NOTE — Procedures (Signed)
Dialysis Catheter Insertion Procedure Note Randall Reyes [id number] 1935/09/28  Procedure: Insertion of Dialysis Catheter Indications: emergent dialysis  Procedure Details Consent: Risks of procedure as well as the alternatives and risks of each were explained to the (patient/caregiver).  Consent for procedure obtained. Time Out: Verified patient identification, verified procedure, site/side was marked, verified correct patient position, special equipment/implants available, medications/allergies/relevent history reviewed, required imaging and test results available.  Performed  Maximum sterile technique was used including antiseptics, cap, gloves, gown, hand hygiene, mask and sheet. Skin prep: chloroprep; local anesthetic administered A antimicrobial bonded/coated double lumen catheter was placed in the right femoral vein due to patient being a dialysis patient using the Seldinger technique.  Evaluation Blood flow good Complications: No apparent complications Patient did tolerate procedure well.   Randall Reyes A 05/23/2016, 7:19 PM

## 2016-05-23 NOTE — Progress Notes (Signed)
CRITICAL VALUE ALERT Critical value received:  WBC-148.7 Date of notification:  05/23/2016 Time of notification: 1338 Critical value read back:  Yes.   Nurse who received alert:  M.Lashawnda Hancox, LPN MD notified (1st page):  [name], MD and Sheldon Silvan, PA-C

## 2016-05-23 NOTE — ED Notes (Signed)
CRITICAL VALUE ALERT  Critical value received:  Potassium 7.1  Date of notification:  05/23/2016  Time of notification:  1620  Critical value read back:  Yes  Nurse who received alert:  Cena Benton   MD notified (1st page):  Dr. Sabra Heck  Time of first page:  1621  Responding MD:  Dr. Sabra Heck  Time MD responded:  [phone]

## 2016-05-23 NOTE — Progress Notes (Signed)
Patient tolerated infusion well.  Upon checking vital signs when completed, patient was found to be bradycardic.  Radial and apical pulse checked to confirm machine readings and pulse found to be between 38-47bpm as well as irregular.  No other symptoms.  Robynn Pane, PA was notified and he ordered an EKG as well as labs as the patient left.  EKG was done and referral to cardiology was done by the PA.  Labs done as the patient left the building.  Labs later revealed decreased kidney function and hyperkalemia and the patient was called to return to the clinic for further evaluation and treatment.   Patient return to the clinic approximately 1430 and in the room for the PA to see.

## 2016-05-24 ENCOUNTER — Other Ambulatory Visit (HOSPITAL_COMMUNITY): Payer: Self-pay | Admitting: Pharmacist

## 2016-05-24 ENCOUNTER — Inpatient Hospital Stay (HOSPITAL_COMMUNITY): Payer: Medicare Other

## 2016-05-24 DIAGNOSIS — E875 Hyperkalemia: Secondary | ICD-10-CM

## 2016-05-24 DIAGNOSIS — C9110 Chronic lymphocytic leukemia of B-cell type not having achieved remission: Secondary | ICD-10-CM

## 2016-05-24 DIAGNOSIS — N179 Acute kidney failure, unspecified: Secondary | ICD-10-CM

## 2016-05-24 LAB — CBC
HCT: 22.4 % — ABNORMAL LOW (ref 39.0–52.0)
Hemoglobin: 7.2 g/dL — ABNORMAL LOW (ref 13.0–17.0)
MCH: 35.1 pg — AB (ref 26.0–34.0)
MCHC: 32.1 g/dL (ref 30.0–36.0)
MCV: 109.3 fL — ABNORMAL HIGH (ref 78.0–100.0)
PLATELETS: 100 10*3/uL — AB (ref 150–400)
RBC: 2.05 MIL/uL — ABNORMAL LOW (ref 4.22–5.81)
RDW: 15.6 % — ABNORMAL HIGH (ref 11.5–15.5)
WBC: 67.7 10*3/uL — AB (ref 4.0–10.5)

## 2016-05-24 LAB — BASIC METABOLIC PANEL
ANION GAP: 9 (ref 5–15)
BUN: 44 mg/dL — ABNORMAL HIGH (ref 6–20)
CALCIUM: 7.3 mg/dL — AB (ref 8.9–10.3)
CO2: 24 mmol/L (ref 22–32)
CREATININE: 2.94 mg/dL — AB (ref 0.61–1.24)
Chloride: 102 mmol/L (ref 101–111)
GFR, EST AFRICAN AMERICAN: 22 mL/min — AB (ref >60)
GFR, EST NON AFRICAN AMERICAN: 19 mL/min — AB (ref >60)
Glucose, Bld: 239 mg/dL — ABNORMAL HIGH (ref 65–99)
Potassium: 4.6 mmol/L (ref 3.5–5.1)
SODIUM: 135 mmol/L (ref 135–145)

## 2016-05-24 LAB — GLUCOSE, CAPILLARY
GLUCOSE-CAPILLARY: 310 mg/dL — AB (ref 65–99)
GLUCOSE-CAPILLARY: 219 mg/dL — AB (ref 65–99)
Glucose-Capillary: 250 mg/dL — ABNORMAL HIGH (ref 65–99)

## 2016-05-24 LAB — CREATININE, URINE, RANDOM: Creatinine, Urine: 57.69 mg/dL

## 2016-05-24 LAB — SODIUM, URINE, RANDOM: SODIUM UR: 59 mmol/L

## 2016-05-24 MED ORDER — INSULIN ASPART 100 UNIT/ML ~~LOC~~ SOLN
3.0000 [IU] | Freq: Three times a day (TID) | SUBCUTANEOUS | Status: DC
  Administered 2016-05-24 – 2016-05-25 (×4): 3 [IU] via SUBCUTANEOUS

## 2016-05-24 MED ORDER — INSULIN ASPART 100 UNIT/ML ~~LOC~~ SOLN
0.0000 [IU] | Freq: Every day | SUBCUTANEOUS | Status: DC
  Administered 2016-05-24: 2 [IU] via SUBCUTANEOUS

## 2016-05-24 MED ORDER — SODIUM CHLORIDE 0.9 % IV SOLN
INTRAVENOUS | Status: DC
  Administered 2016-05-24 – 2016-05-28 (×7): via INTRAVENOUS

## 2016-05-24 MED ORDER — SEVELAMER CARBONATE 800 MG PO TABS
1600.0000 mg | ORAL_TABLET | Freq: Three times a day (TID) | ORAL | Status: DC
  Administered 2016-05-24 – 2016-05-28 (×12): 1600 mg via ORAL
  Filled 2016-05-24 (×11): qty 2

## 2016-05-24 MED ORDER — INSULIN ASPART 100 UNIT/ML ~~LOC~~ SOLN
0.0000 [IU] | Freq: Three times a day (TID) | SUBCUTANEOUS | Status: DC
  Administered 2016-05-24: 7 [IU] via SUBCUTANEOUS
  Administered 2016-05-24 – 2016-05-25 (×2): 3 [IU] via SUBCUTANEOUS
  Administered 2016-05-25: 2 [IU] via SUBCUTANEOUS

## 2016-05-24 MED ORDER — ALLOPURINOL 100 MG PO TABS
100.0000 mg | ORAL_TABLET | Freq: Two times a day (BID) | ORAL | Status: DC
  Administered 2016-05-24 – 2016-05-28 (×9): 100 mg via ORAL
  Filled 2016-05-24 (×9): qty 1

## 2016-05-24 NOTE — Progress Notes (Signed)
Subjective: Interval History: has no complaint of nausea or vomiting. Patient also denies any difficulty breathing..  Objective: Vital signs in last 24 hours: Temp:  [97.6 F (36.4 C)-98 F (36.7 C)] 97.7 F (36.5 C) (01/11 0900) Pulse Rate:  [27-79] 57 (01/11 0900) Resp:  [9-22] 10 (01/11 0900) BP: (83-115)/(36-63) 92/58 (01/11 0900) SpO2:  [89 %-100 %] 97 % (01/11 0900) Weight:  [62.6 kg (138 lb)-64.7 kg (142 lb 10.2 oz)] 64.7 kg (142 lb 10.2 oz) (01/11 0454) Weight change:   Intake/Output from previous day: 01/10 0701 - 01/11 0700 In: 1210 [I.V.:1100; IV Piggyback:110] Out: 350 [Urine:350] Intake/Output this shift: Total I/O In: -  Out: 150 [Urine:150]  General appearance: alert, cooperative and no distress Resp: clear to auscultation bilaterally Cardio: regular rate and rhythm, S1, S2 normal, no murmur, click, rub or gallop and regular rate and rhythm GI: soft, non-tender; bowel sounds normal; no masses,  no organomegaly Extremities: No edema  Lab Results:  Recent Labs  05/23/16 1534 05/23/16 1545 05/24/16 0502  WBC 137.1*  --  67.7*  HGB 9.3* 10.2* 7.2*  HCT 27.8* 30.0* 22.4*  PLT 136*  --  100*   BMET:  Recent Labs  05/23/16 1534 05/23/16 1545 05/23/16 1731 05/24/16 0502  NA 133* 135  --  135  K 7.1* 7.1* 6.6* 4.6  CL 105 107  --  102  CO2 16*  --   --  24  GLUCOSE 315* 296*  --  239*  BUN 65* 75*  --  44*  CREATININE 4.45* 4.50*  --  2.94*  CALCIUM 7.6*  --   --  7.3*   No results for input(s): PTH in the last 72 hours. Iron Studies: No results for input(s): IRON, TIBC, TRANSFERRIN, FERRITIN in the last 72 hours.  Studies/Results: No results found.  I have reviewed the patient's current medications.  Assessment/Plan: Problem #1 hyperkalemia: He is status post hemodialysis yesterday. His potassium is normal Problem #2 acute kidney injury superimposed on chronic. Presently patient is asymptomatic. His pending creatinine is stable Problem #3  anemia: His hemoglobin is low and declining. Problem #4 history of chronic lymphocytic anemia Problem #5 history of diabetes Problem #6 hypertension: His blood pressures reasonably controlled  Problem #7 metabolic bone disease: Calcium is red but phosphorus is very high Plan: We'll start patient on ambulance 800 mg 2 tablets by mouth 3 times a day with meals one with snack 2] will start patient on normal saline at 75 mL/h 3] will check his renal panel in the morning.   LOS: 1 day   Shelvy Heckert S 05/24/2016,10:23 AM

## 2016-05-24 NOTE — Progress Notes (Signed)
PROGRESS NOTE    Randall Reyes  [id number] DOB: 04-Sep-1935 DOA: 05/23/2016 PCP: [name], MD     Brief Narrative:  81 yo man admitted from home on 1/10 after oncology asked him to return to the hospital for hyperkalemia. Currently undergoing treatment for CLL. With significant bradycardia and failure to improve with medical treatment, decision was made for emergent hemodyalisis.   Assessment & Plan:   Principal Problem:   Hyperkalemia Active Problems:   CLL (chronic lymphocytic leukemia) (HCC)   DM (diabetes mellitus) ([place])   Port catheter in place   AKI (acute kidney injury) ([place])   HTN (hypertension)   Bigeminy   Hyperkalemia -Resolved with HD. -K this am is 4.6.  Bradycardia -Resolved after HD. -Will remove pacer pads today.  Hypotension -SBP in 70s-90s. -Renal has started some IVF.  CLL -Continue OP f/u with oncology as scheduled. -WBC 67-137.  ARF -Cr improving. -Continue to follow.  DM -Check A1C. -Start SSI.   DVT prophylaxis: SQ heparin Code Status: full code Family Communication: wife at bedside updated on plan of care and all questions answered Disposition Plan: to be determined pending medical course.  Consultants:   Nephrology  Procedures:   Emergent HD  Antimicrobials:  Anti-infectives    Start     Dose/Rate Route Frequency Ordered Stop   05/24/16 1000  acyclovir (ZOVIRAX) tablet 400 mg     400 mg Oral Daily 05/23/16 1900         Subjective: No complaints.  Objective: Vitals:   05/24/16 0700 05/24/16 0800 05/24/16 0900 05/24/16 1000  BP: (!) 90/55 (!) 94/51 (!) 92/58 (!) 89/56  Pulse: (!) 51 (!) 51 (!) 57 93  Resp: 15 10 10 17   Temp:   97.7 F (36.5 C)   TempSrc:   Oral   SpO2: 99% 98% 97% 97%  Weight:      Height:        Intake/Output Summary (Last 24 hours) at 05/24/16 1047 Last data filed at 05/24/16 0929  Gross per 24 hour  Intake             1210 ml  Output              500 ml  Net               710 ml   Filed Weights   05/23/16 1529 05/23/16 2015 05/24/16 0454  Weight: 62.6 kg (138 lb) 62.6 kg (138 lb 0.1 oz) 64.7 kg (142 lb 10.2 oz)    Examination:  General exam: Alert, awake, oriented x 3 Respiratory system: Clear to auscultation. Respiratory effort normal. Cardiovascular system:RRR. No murmurs, rubs, gallops. Gastrointestinal system: Abdomen is nondistended, soft and nontender. No organomegaly or masses felt. Normal bowel sounds heard. Central nervous system: Alert and oriented. No focal neurological deficits. Extremities: No C/C/E, +pedal pulses Skin: No rashes, lesions or ulcers Psychiatry: Judgement and insight appear normal. Mood & affect appropriate.     Data Reviewed: I have personally reviewed following labs and imaging studies  CBC:  Recent Labs Lab 05/23/16 1315 05/23/16 1534 05/23/16 1545 05/24/16 0502  WBC 148.7* 137.1*  --  67.7*  NEUTROABS 14.9* 8.2*  --   --   HGB 9.2* 9.3* 10.2* 7.2*  HCT 28.4* 27.8* 30.0* 22.4*  MCV 111.8* 111.2*  --  109.3*  PLT 141* 136*  --  123XX123*   Basic Metabolic Panel:  Recent Labs Lab 05/23/16 1315 05/23/16 1534 05/23/16 1545 05/23/16 1731 05/24/16 0502  NA 134* 133* 135  --  135  K 7.4* 7.1* 7.1* 6.6* 4.6  CL 105 105 107  --  102  CO2 17* 16*  --   --  24  GLUCOSE 277* 315* 296*  --  239*  BUN 61* 65* 75*  --  44*  CREATININE 4.26* 4.45* 4.50*  --  2.94*  CALCIUM 7.7* 7.6*  --   --  7.3*  MG 2.3  --   --   --   --   PHOS  --  9.1*  --   --   --    GFR: Estimated Creatinine Clearance: 16.8 mL/min (by C-G formula based on SCr of 2.94 mg/dL (H)). Liver Function Tests:  Recent Labs Lab 05/23/16 1315 05/23/16 1534  AST 72* 67*  ALT 25 23  ALKPHOS 75 76  BILITOT 0.4 0.4  PROT 7.0 7.2  ALBUMIN 3.7 3.7   No results for input(s): LIPASE, AMYLASE in the last 168 hours. No results for input(s): AMMONIA in the last 168 hours. Coagulation Profile: No results for input(s): INR, PROTIME in the last 168  hours. Cardiac Enzymes: No results for input(s): CKTOTAL, CKMB, CKMBINDEX, TROPONINI in the last 168 hours. BNP (last 3 results) No results for input(s): PROBNP in the last 8760 hours. HbA1C: No results for input(s): HGBA1C in the last 72 hours. CBG: No results for input(s): GLUCAP in the last 168 hours. Lipid Profile: No results for input(s): CHOL, HDL, LDLCALC, TRIG, CHOLHDL, LDLDIRECT in the last 72 hours. Thyroid Function Tests:  Recent Labs  05/23/16 1733  TSH 2.324   Anemia Panel: No results for input(s): VITAMINB12, FOLATE, FERRITIN, TIBC, IRON, RETICCTPCT in the last 72 hours. Urine analysis:    Component Value Date/Time   COLORURINE YELLOW 02/11/2015 1718   APPEARANCEUR CLEAR 02/11/2015 1718   LABSPEC 1.015 02/11/2015 1718   PHURINE 6.0 02/11/2015 1718   GLUCOSEU 250 (A) 02/11/2015 1718   HGBUR NEGATIVE 02/11/2015 1718   BILIRUBINUR NEGATIVE 02/11/2015 1718   KETONESUR NEGATIVE 02/11/2015 1718   PROTEINUR NEGATIVE 02/11/2015 1718   UROBILINOGEN 0.2 02/11/2015 1718   NITRITE NEGATIVE 02/11/2015 1718   LEUKOCYTESUR NEGATIVE 02/11/2015 1718   Sepsis Labs: @LABRCNTIP (procalcitonin:4,lacticidven:4)  ) Recent Results (from the past 240 hour(s))  MRSA PCR Screening     Status: None   Collection Time: 05/23/16  6:40 PM  Result Value Ref Range Status   MRSA by PCR NEGATIVE NEGATIVE Final    Comment:        The GeneXpert MRSA Assay (FDA approved for NASAL specimens only), is one component of a comprehensive MRSA colonization surveillance program. It is not intended to diagnose MRSA infection nor to guide or monitor treatment for MRSA infections.          Radiology Studies: No results found.      Scheduled Meds: . acyclovir  400 mg Oral Daily  . allopurinol  100 mg Oral BID  . cholecalciferol  400 Units Oral Daily  . heparin  5,000 Units Subcutaneous Q8H  . rosuvastatin  40 mg Oral QHS  . sevelamer carbonate  1,600 mg Oral TID WC  . sodium  bicarbonate  650 mg Oral BID   Continuous Infusions: . sodium chloride 75 mL/hr at 05/24/16 1044     LOS: 1 day    Time spent: 25 minutes. Greater than 50% of this time was spent in direct contact with the patient coordinating care.     Lelon Frohlich, MD [facility] Pager [phone]  If 7PM-7AM, please contact night-coverage [email or web address] Password [facility] 05/24/2016, 10:47 AM

## 2016-05-25 ENCOUNTER — Other Ambulatory Visit (HOSPITAL_COMMUNITY): Payer: Medicare Other

## 2016-05-25 ENCOUNTER — Ambulatory Visit (HOSPITAL_COMMUNITY): Payer: Medicare Other | Admitting: Oncology

## 2016-05-25 LAB — HEPATITIS B SURFACE ANTIBODY, QUANTITATIVE: Hep B S AB Quant (Post): <3.1 m[IU]/mL — ABNORMAL LOW (ref >9.9)

## 2016-05-25 LAB — RENAL FUNCTION PANEL
ALBUMIN: 2.9 g/dL — AB (ref 3.5–5.0)
ANION GAP: 9 (ref 5–15)
BUN: 69 mg/dL — ABNORMAL HIGH (ref 6–20)
CALCIUM: 6.2 mg/dL — AB (ref 8.9–10.3)
CO2: 22 mmol/L (ref 22–32)
Chloride: 103 mmol/L (ref 101–111)
Creatinine, Ser: 3.51 mg/dL — ABNORMAL HIGH (ref 0.61–1.24)
GFR, EST AFRICAN AMERICAN: 18 mL/min — AB (ref >60)
GFR, EST NON AFRICAN AMERICAN: 15 mL/min — AB (ref >60)
Glucose, Bld: 215 mg/dL — ABNORMAL HIGH (ref 65–99)
POTASSIUM: 5 mmol/L (ref 3.5–5.1)
Phosphorus: 9.8 mg/dL — ABNORMAL HIGH (ref 2.5–4.6)
SODIUM: 134 mmol/L — AB (ref 135–145)

## 2016-05-25 LAB — GLUCOSE, CAPILLARY
Glucose-Capillary: 187 mg/dL — ABNORMAL HIGH (ref 65–99)
Glucose-Capillary: 179 mg/dL — ABNORMAL HIGH (ref 65–99)
Glucose-Capillary: 213 mg/dL — ABNORMAL HIGH (ref 65–99)
Glucose-Capillary: 162 mg/dL — ABNORMAL HIGH (ref 65–99)

## 2016-05-25 LAB — PHOSPHORUS: Phosphorus: 10 mg/dL — ABNORMAL HIGH (ref 2.5–4.6)

## 2016-05-25 LAB — URIC ACID: Uric Acid, Serum: 1.8 mg/dL — ABNORMAL LOW (ref 4.4–7.6)

## 2016-05-25 LAB — HEMOGLOBIN A1C
Hgb A1c MFr Bld: 6.5 % — ABNORMAL HIGH (ref 4.8–5.6)
Mean Plasma Glucose: 140 mg/dL

## 2016-05-25 LAB — HEPATITIS B SURFACE ANTIGEN: Hepatitis B Surface Ag: NEGATIVE

## 2016-05-25 LAB — HEPATITIS B CORE ANTIBODY, TOTAL: Hep B Core Total Ab: NEGATIVE

## 2016-05-25 MED ORDER — INSULIN ASPART 100 UNIT/ML ~~LOC~~ SOLN
0.0000 [IU] | Freq: Three times a day (TID) | SUBCUTANEOUS | Status: DC
  Administered 2016-05-26 – 2016-05-27 (×3): 2 [IU] via SUBCUTANEOUS

## 2016-05-25 MED ORDER — INSULIN ASPART 100 UNIT/ML ~~LOC~~ SOLN: 0.0000 [IU] | Freq: Every day | SUBCUTANEOUS | Status: DC

## 2016-05-25 MED ORDER — INSULIN ASPART 100 UNIT/ML ~~LOC~~ SOLN
4.0000 [IU] | Freq: Three times a day (TID) | SUBCUTANEOUS | Status: DC
  Administered 2016-05-26 – 2016-05-27 (×4): 4 [IU] via SUBCUTANEOUS

## 2016-05-25 NOTE — Procedures (Signed)
   HEMODIALYSIS TREATMENT NOTE:  3.5 hour heparin-free dialysis completed via right femoral temp catheter. Exit site unremarkable. NO fluid removal, per MD order.  BP stable. Bradycardia 47-55 with occasional pause.  Primary RN aware.  Rockwell Alexandria, RN, CDN

## 2016-05-25 NOTE — Progress Notes (Signed)
PROGRESS NOTE    Randall Reyes  [id number] DOB: 1935-09-25 DOA: 05/23/2016 PCP: [name], MD     Brief Narrative:  81 yo man admitted from home on 1/10 after oncology asked him to return to the hospital for hyperkalemia. Currently undergoing treatment for CLL. With significant bradycardia and failure to improve with medical treatment, decision was made for emergent hemodyalisis.   Assessment & Plan:   Principal Problem:   Hyperkalemia Active Problems:   CLL (chronic lymphocytic leukemia) (HCC)   DM (diabetes mellitus) ([place])   Port catheter in place   AKI (acute kidney injury) ([place])   HTN (hypertension)   Bigeminy   Hyperkalemia/?Tumor Lysis Syndrome -Improved with HD -K this am is 5.0  Bradycardia -Resolved after HD.  Hypotension -Resolved with IVF.  CLL -Continue OP f/u with oncology as scheduled. -WBC 67-137.  ARF/?Tumor Lysis Syndrome -Cr a little higher today at 3.5 Coupled with hyperphosphatemia and hypocalcemia , nephrology has made the decision to do a dialysis session today.   DM -Controlled, increase sliding-scale to moderate, increasing the coverage to 4 units.   DVT prophylaxis: SQ heparin Code Status: full code Family Communication: wife at bedside updated on plan of care and all questions answered Disposition Plan: to be determined pending medical course.  Consultants:   Nephrology  Procedures:   Emergent HD  Antimicrobials:  Anti-infectives    Start     Dose/Rate Route Frequency Ordered Stop   05/24/16 1000  acyclovir (ZOVIRAX) tablet 400 mg     400 mg Oral Daily 05/23/16 1900         Subjective: No complaints.  Objective: Vitals:   05/25/16 1330 05/25/16 1345 05/25/16 1400 05/25/16 1415  BP: 126/66 120/61 124/61 126/64  Pulse: (!) 50 (!) 49 (!) 46 (!) 54  Resp:      Temp:      TempSrc:      SpO2:      Weight:      Height:        Intake/Output Summary (Last 24 hours) at 05/25/16 1746 Last data filed at  05/25/16 1637  Gross per 24 hour  Intake             1265 ml  Output             1300 ml  Net              -35 ml   Filed Weights   05/24/16 0454 05/25/16 0455 05/25/16 1045  Weight: 64.7 kg (142 lb 10.2 oz) 67.5 kg (148 lb 13 oz) 67.6 kg (149 lb 0.5 oz)    Examination:  General exam: Alert, awake, oriented x 3 Respiratory system: Clear to auscultation. Respiratory effort normal. Cardiovascular system:RRR. No murmurs, rubs, gallops. Gastrointestinal system: Abdomen is nondistended, soft and nontender. No organomegaly or masses felt. Normal bowel sounds heard. Central nervous system: Alert and oriented. No focal neurological deficits. Extremities: No C/C/E, +pedal pulses Skin: No rashes, lesions or ulcers Psychiatry: Judgement and insight appear normal. Mood & affect appropriate.     Data Reviewed: I have personally reviewed following labs and imaging studies  CBC:  Recent Labs Lab 05/23/16 1315 05/23/16 1534 05/23/16 1545 05/24/16 0502  WBC 148.7* 137.1*  --  67.7*  NEUTROABS 14.9* 8.2*  --   --   HGB 9.2* 9.3* 10.2* 7.2*  HCT 28.4* 27.8* 30.0* 22.4*  MCV 111.8* 111.2*  --  109.3*  PLT 141* 136*  --  100*   Basic  Metabolic Panel:  Recent Labs Lab 05/23/16 1315 05/23/16 1534 05/23/16 1545 05/23/16 1731 05/24/16 0502 05/25/16 0500 05/25/16 0546  NA 134* 133* 135  --  135  --  134*  K 7.4* 7.1* 7.1* 6.6* 4.6  --  5.0  CL 105 105 107  --  102  --  103  CO2 17* 16*  --   --  24  --  22  GLUCOSE 277* 315* 296*  --  239*  --  215*  BUN 61* 65* 75*  --  44*  --  69*  CREATININE 4.26* 4.45* 4.50*  --  2.94*  --  3.51*  CALCIUM 7.7* 7.6*  --   --  7.3*  --  6.2*  MG 2.3  --   --   --   --   --   --   PHOS  --  9.1*  --   --   --  10.0* 9.8*   GFR: Estimated Creatinine Clearance: 14.3 mL/min (by C-G formula based on SCr of 3.51 mg/dL (H)). Liver Function Tests:  Recent Labs Lab 05/23/16 1315 05/23/16 1534 05/25/16 0546  AST 72* 67*  --   ALT 25 23  --     ALKPHOS 75 76  --   BILITOT 0.4 0.4  --   PROT 7.0 7.2  --   ALBUMIN 3.7 3.7 2.9*   No results for input(s): LIPASE, AMYLASE in the last 168 hours. No results for input(s): AMMONIA in the last 168 hours. Coagulation Profile: No results for input(s): INR, PROTIME in the last 168 hours. Cardiac Enzymes: No results for input(s): CKTOTAL, CKMB, CKMBINDEX, TROPONINI in the last 168 hours. BNP (last 3 results) No results for input(s): PROBNP in the last 8760 hours. HbA1C:  Recent Labs  05/24/16 0502  HGBA1C 6.5*   CBG:  Recent Labs Lab 05/24/16 1602 05/24/16 2133 05/25/16 0753 05/25/16 1138 05/25/16 1646  GLUCAP 250* 219* 187* 179* 213*   Lipid Profile: No results for input(s): CHOL, HDL, LDLCALC, TRIG, CHOLHDL, LDLDIRECT in the last 72 hours. Thyroid Function Tests:  Recent Labs  05/23/16 1733  TSH 2.324   Anemia Panel: No results for input(s): VITAMINB12, FOLATE, FERRITIN, TIBC, IRON, RETICCTPCT in the last 72 hours. Urine analysis:    Component Value Date/Time   COLORURINE YELLOW 02/11/2015 1718   APPEARANCEUR CLEAR 02/11/2015 1718   LABSPEC 1.015 02/11/2015 1718   PHURINE 6.0 02/11/2015 1718   GLUCOSEU 250 (A) 02/11/2015 1718   HGBUR NEGATIVE 02/11/2015 1718   BILIRUBINUR NEGATIVE 02/11/2015 1718   KETONESUR NEGATIVE 02/11/2015 1718   PROTEINUR NEGATIVE 02/11/2015 1718   UROBILINOGEN 0.2 02/11/2015 1718   NITRITE NEGATIVE 02/11/2015 1718   LEUKOCYTESUR NEGATIVE 02/11/2015 1718   Sepsis Labs: @LABRCNTIP (procalcitonin:4,lacticidven:4)  ) Recent Results (from the past 240 hour(s))  MRSA PCR Screening     Status: None   Collection Time: 05/23/16  6:40 PM  Result Value Ref Range Status   MRSA by PCR NEGATIVE NEGATIVE Final    Comment:        The GeneXpert MRSA Assay (FDA approved for NASAL specimens only), is one component of a comprehensive MRSA colonization surveillance program. It is not intended to diagnose MRSA infection nor to guide  or monitor treatment for MRSA infections.          Radiology Studies: US Renal  Result Date: 05/24/2016 CLINICAL DATA:  Acute tubular necrosis EXAM: RENAL / URINARY TRACT ULTRASOUND COMPLETE COMPARISON:  12/16/2015, 02/02/2015 FINDINGS: Right Kidney: Length:  8.3 cm. Echogenicity within normal limits. No mass or hydronephrosis visualized. Left Kidney: Length: 8.1 cm. Echogenicity within normal limits. No mass or hydronephrosis visualized. Bladder: Appears normal for degree of bladder distention. Note is made of small left pleural effusion. IMPRESSION: No acute abnormality noted in the kidneys. Small left pleural effusion Electronically Signed   By: Inez Catalina M.D.   On: 05/24/2016 15:32        Scheduled Meds: . acyclovir  400 mg Oral Daily  . allopurinol  100 mg Oral BID  . cholecalciferol  400 Units Oral Daily  . heparin  5,000 Units Subcutaneous Q8H  . insulin aspart  0-5 Units Subcutaneous QHS  . insulin aspart  0-9 Units Subcutaneous TID WC  . insulin aspart  3 Units Subcutaneous TID WC  . rosuvastatin  40 mg Oral QHS  . sevelamer carbonate  1,600 mg Oral TID WC  . sodium bicarbonate  650 mg Oral BID   Continuous Infusions: . sodium chloride 75 mL/hr at 05/25/16 1324     LOS: 2 days    Time spent: 25 minutes. Greater than 50% of this time was spent in direct contact with the patient coordinating care.     Lelon Frohlich, MD [facility] Pager [phone]  If 7PM-7AM, please contact night-coverage [email or web address] Password [facility] 05/25/2016, 5:46 PM

## 2016-05-25 NOTE — Progress Notes (Signed)
Randall Reyes  MRN: [id number]  DOB/AGE: [birth year] 79 y.o.  Primary Care Physician:James Maudie Mercury, MD  Admit date: 05/23/2016  Chief Complaint:  Chief Complaint  Patient presents with  . hyperkalemia    S-Pt presented on  05/23/2016 with  Chief Complaint  Patient presents with  . hyperkalemia  .    Pt today feels better   . acyclovir  400 mg Oral Daily  . allopurinol  100 mg Oral BID  . cholecalciferol  400 Units Oral Daily  . heparin  5,000 Units Subcutaneous Q8H  . insulin aspart  0-5 Units Subcutaneous QHS  . insulin aspart  0-9 Units Subcutaneous TID WC  . insulin aspart  3 Units Subcutaneous TID WC  . rosuvastatin  40 mg Oral QHS  . sevelamer carbonate  1,600 mg Oral TID WC  . sodium bicarbonate  650 mg Oral BID      Physical Exam: Vital signs in last 24 hours: Temp:  [97.9 F (36.6 C)-98.6 F (37 C)] 98.2 F (36.8 C) (01/12 0800) Pulse Rate:  [50-101] 90 (01/12 0900) Resp:  [0-28] 28 (01/12 0900) BP: (78-101)/(50-76) 100/76 (01/12 0900) SpO2:  [93 %-99 %] 98 % (01/12 0900) Weight:  [148 lb 13 oz (67.5 kg)] 148 lb 13 oz (67.5 kg) (01/12 0455) Weight change: 10 lb 13 oz (4.904 kg) Last BM Date: 05/23/16  Intake/Output from previous day: 01/11 0701 - 01/12 0700 In: 1585 [P.O.:440; I.V.:1145] Out: 775 [Urine:775] Total I/O In: -  Out: 250 [Urine:250]   Physical Exam: General- pt is awake,alert, oriented to time place and person Resp- No acute REsp distress, CTA B/L NO Rhonchi CVS- S1S2 regular in rate and rhythm GIT- BS+, soft, NT, ND EXT- NO LE Edema, Cyanosis   Lab Results: CBC  Recent Labs  05/23/16 1534 05/23/16 1545 05/24/16 0502  WBC 137.1*  --  67.7*  HGB 9.3* 10.2* 7.2*  HCT 27.8* 30.0* 22.4*  PLT 136*  --  100*    BMET  Recent Labs  05/24/16 0502 05/25/16 0546  NA 135 134*  K 4.6 5.0  CL 102 103  CO2 24 22  GLUCOSE 239* 215*  BUN 44* 69*  CREATININE 2.94* 3.51*  CALCIUM 7.3* 6.2*    MICRO Recent Results (from  the past 240 hour(s))  MRSA PCR Screening     Status: None   Collection Time: 05/23/16  6:40 PM  Result Value Ref Range Status   MRSA by PCR NEGATIVE NEGATIVE Final    Comment:        The GeneXpert MRSA Assay (FDA approved for NASAL specimens only), is one component of a comprehensive MRSA colonization surveillance program. It is not intended to diagnose MRSA infection nor to guide or monitor treatment for MRSA infections.       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                 Nephrolithiasis Hx Absent                  Data in favour of TLL                HIgh Phos                Low calcium                WBC 137=>67                 Data against                  NON Oliguric AKI                 Uric acid low       2)HTN  Medication- On Diuretics  3)Anemia HGb at goal (9--11)   4)CKD Mineral-Bone Disorder PTH acceptable. Secondary Hyperparathyroidism absent. Phosphorus  Not at goal.    On binders  5)Hyperkalemia    Sec to AKI on CKD.    Required HD    Now better  6)[facility]- admitted with bradycardia     Pt has hx of Junctional rhtythm  7)Acid base Co2 at goal       Plan:   Will dialyze again today in an effort to reduce phos load Will follow bmet    Charlee Squibb S 05/25/2016, 10:00 AM

## 2016-05-25 NOTE — Progress Notes (Signed)
[id number] Ca+ 6.2 this AM lab, MD notified.

## 2016-05-25 NOTE — Plan of Care (Signed)
Problem: Safety: Goal: Ability to remain free from injury will improve Outcome: Progressing Patient is free from injury at this time. Call light is within reach, bed is in the lowest position, belongings in reach.  Problem: Pain Managment: Goal: General experience of comfort will improve Outcome: Progressing Patient has been free of pain at this time.  Problem: Physical Regulation: Goal: Will remain free from infection Outcome: Progressing Patient is on Protective Precautions. Family and patient educated on hand hygiene and need to wear mask,

## 2016-05-26 LAB — GLUCOSE, CAPILLARY
GLUCOSE-CAPILLARY: 98 mg/dL (ref 65–99)
GLUCOSE-CAPILLARY: 117 mg/dL — AB (ref 65–99)
Glucose-Capillary: 120 mg/dL — ABNORMAL HIGH (ref 65–99)
Glucose-Capillary: 126 mg/dL — ABNORMAL HIGH (ref 65–99)
Glucose-Capillary: 65 mg/dL (ref 65–99)

## 2016-05-26 LAB — RENAL FUNCTION PANEL
ALBUMIN: 2.9 g/dL — AB (ref 3.5–5.0)
ANION GAP: 6 (ref 5–15)
BUN: 28 mg/dL — ABNORMAL HIGH (ref 6–20)
CALCIUM: 6.7 mg/dL — AB (ref 8.9–10.3)
CO2: 29 mmol/L (ref 22–32)
Chloride: 105 mmol/L (ref 101–111)
Creatinine, Ser: 2.1 mg/dL — ABNORMAL HIGH (ref 0.61–1.24)
GFR, EST AFRICAN AMERICAN: 33 mL/min — AB (ref >60)
GFR, EST NON AFRICAN AMERICAN: 29 mL/min — AB (ref >60)
Glucose, Bld: 139 mg/dL — ABNORMAL HIGH (ref 65–99)
PHOSPHORUS: 4.4 mg/dL (ref 2.5–4.6)
Potassium: 3.9 mmol/L (ref 3.5–5.1)
SODIUM: 140 mmol/L (ref 135–145)

## 2016-05-26 LAB — CBC
HCT: 21.8 % — ABNORMAL LOW (ref 39.0–52.0)
HEMOGLOBIN: 7.1 g/dL — AB (ref 13.0–17.0)
MCH: 35.5 pg — ABNORMAL HIGH (ref 26.0–34.0)
MCHC: 32.6 g/dL (ref 30.0–36.0)
MCV: 109 fL — ABNORMAL HIGH (ref 78.0–100.0)
Platelets: 77 10*3/uL — ABNORMAL LOW (ref 150–400)
RBC: 2 MIL/uL — AB (ref 4.22–5.81)
RDW: 15.8 % — ABNORMAL HIGH (ref 11.5–15.5)
WBC: 9.8 10*3/uL (ref 4.0–10.5)

## 2016-05-26 NOTE — Progress Notes (Addendum)
Randall Reyes  MRN: [id number]  DOB/AGE: [birth year] 81 y.o.  Primary Care Physician:James Maudie Mercury, MD  Admit date: 05/23/2016  Chief Complaint:  Chief Complaint  Patient presents with  . hyperkalemia    Reyes-Pt presented on  05/23/2016 with  Chief Complaint  Patient presents with  . hyperkalemia  .    Pt today feels better   . acyclovir  400 mg Oral Daily  . allopurinol  100 mg Oral BID  . cholecalciferol  400 Units Oral Daily  . heparin  5,000 Units Subcutaneous Q8H  . insulin aspart  0-15 Units Subcutaneous TID WC  . insulin aspart  0-5 Units Subcutaneous QHS  . insulin aspart  4 Units Subcutaneous TID WC  . rosuvastatin  40 mg Oral QHS  . sevelamer carbonate  1,600 mg Oral TID WC  . sodium bicarbonate  650 mg Oral BID      Physical Exam: Vital signs in last 24 hours: Temp:  [97.8 F (36.6 C)-98.3 F (36.8 C)] 97.9 F (36.6 C) (01/13 0826) Pulse Rate:  [46-72] 65 (01/13 0800) Resp:  [11-20] 13 (01/13 0800) BP: (88-126)/(53-83) 105/58 (01/13 0800) SpO2:  [95 %-100 %] 100 % (01/13 0800) Weight:  [145 lb 8.1 oz (66 kg)-149 lb 0.5 oz (67.6 kg)] 145 lb 8.1 oz (66 kg) (01/13 0400) Weight change: 3.5 oz (0.1 kg) Last BM Date: 05/23/16  Intake/Output from previous day: 01/12 0701 - 01/13 0700 In: 240 [P.O.:240] Out: 975 [Urine:975] No intake/output data recorded.   Physical Exam: General- pt is awake,alert, oriented to time place and person Resp- No acute REsp distress, CTA B/L NO Rhonchi CVS- S1S2 regular in rate and rhythm GIT- BS+, soft, NT, ND EXT- NO LE Edema, Cyanosis   Lab Results: CBC  Recent Labs  05/24/16 0502 05/26/16 0512  WBC 67.7* 9.8  HGB 7.2* 7.1*  HCT 22.4* 21.8*  PLT 100* 77*    BMET  Recent Labs  05/25/16 0546 05/26/16 0512  NA 134* 140  K 5.0 3.9  CL 103 105  CO2 22 29  GLUCOSE 215* 139*  BUN 69* 28*  CREATININE 3.51* 2.10*  CALCIUM 6.2* 6.7*   Creat trend 2018  4.45=>2.1 2017  1.7--2.0 2016  1.5--2.6 2015   1.4--2.2 2014  1.2--1.5 2013  1.2--1.4 2012  1.3--2.0 2011  1.3 2010  1.3--2.2 2008   1.37 MICRO Recent Results (from the past 240 hour(Reyes))  MRSA PCR Screening     Status: None   Collection Time: 05/23/16  6:40 PM  Result Value Ref Range Status   MRSA by PCR NEGATIVE NEGATIVE Final    Comment:        The GeneXpert MRSA Assay (FDA approved for NASAL specimens only), is one component of a comprehensive MRSA colonization surveillance program. It is not intended to diagnose MRSA infection nor to guide or monitor treatment for MRSA infections.       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                 Nephrolithiasis Hx Absent                  Data in favour of TLL                HIgh Phos                Low calcium                WBC 137=>67                 Data against                  NON Oliguric AKI                 Uric acid low                Pt required Renal replacement therapy sec to Hyperkalemia and Hyperphosphatemia                 2)HTN  bp stable  3)Anemia HGb low but stable  4)CKD Mineral-Bone Disorder PTH acceptable. Secondary Hyperparathyroidism absent. Phosphorus  Now at goal.    On binders  5)Hyperkalemia    Sec to AKI on CKD.    Required HD    Now better  6)[facility]- admitted with bradycardia     Pt has hx of Junctional rhythm    Now better  7)Acid base Co2 at goal       Plan:   Will continue current care Will follow bmet    Randall Reyes 05/26/2016,  9:28 AM

## 2016-05-26 NOTE — Progress Notes (Signed)
PROGRESS NOTE    Randall Reyes  [id number] DOB: [birth year] DOA: 05/23/2016 PCP: [name], MD     Brief Narrative:  81 yo man admitted from home on 1/10 after oncology asked him to return to the hospital for hyperkalemia. Currently undergoing treatment for CLL. With significant bradycardia and failure to improve with medical treatment, decision was made for emergent hemodyalisis.   Assessment & Plan:   Principal Problem:   Hyperkalemia Active Problems:   CLL (chronic lymphocytic leukemia) (HCC)   DM (diabetes mellitus) ([place])   Port catheter in place   AKI (acute kidney injury) ([place])   HTN (hypertension)   Bigeminy   Hyperkalemia/?Tumor Lysis Syndrome -Improved with HD -K this am is 3.9  Bradycardia -Resolved after HD.  Hypotension -Resolved with IVF.  CLL -Continue OP f/u with oncology as scheduled. -WBC 67-137.  ARF/?Tumor Lysis Syndrome -Cr a little higher today at 3.5 -Has received a total of 2 dialysis sessions, no plans for dialysis at the moment. -Appreciate nephrology input and recommendations.  DM -Controlled, increase sliding-scale to moderate, increasing the coverage to 4 units.   DVT prophylaxis: SQ heparin Code Status: full code Family Communication: wife at bedside updated on plan of care and all questions answered Disposition Plan: to be determined pending medical course.  Consultants:   Nephrology  Procedures:   Emergent HD  Antimicrobials:  Anti-infectives    Start     Dose/Rate Route Frequency Ordered Stop   05/24/16 1000  acyclovir (ZOVIRAX) tablet 400 mg     400 mg Oral Daily 05/23/16 1900         Subjective: No complaints.  Objective: Vitals:   05/26/16 1300 05/26/16 1400 05/26/16 1500 05/26/16 1600  BP: 106/60 (!) 95/59 (!) 86/55 (!) 97/57  Pulse: (!) 55 (!) 50 (!) 52 65  Resp:   13 16  Temp:    97.6 F (36.4 C)  TempSrc:    Oral  SpO2: 100% 100% 100% 100%  Weight:      Height:        Intake/Output  Summary (Last 24 hours) at 05/26/16 1853 Last data filed at 05/26/16 1700  Gross per 24 hour  Intake             3405 ml  Output              700 ml  Net             2705 ml   Filed Weights   05/25/16 0455 05/25/16 1045 05/26/16 0400  Weight: 67.5 kg (148 lb 13 oz) 67.6 kg (149 lb 0.5 oz) 66 kg (145 lb 8.1 oz)    Examination:  General exam: Alert, awake, oriented x 3 Respiratory system: Clear to auscultation. Respiratory effort normal. Cardiovascular system:RRR. No murmurs, rubs, gallops. Gastrointestinal system: Abdomen is nondistended, soft and nontender. No organomegaly or masses felt. Normal bowel sounds heard. Central nervous system: Alert and oriented. No focal neurological deficits. Extremities: No C/C/E, +pedal pulses Skin: No rashes, lesions or ulcers Psychiatry: Judgement and insight appear normal. Mood & affect appropriate.     Data Reviewed: I have personally reviewed following labs and imaging studies  CBC:  Recent Labs Lab 05/23/16 1315 05/23/16 1534 05/23/16 1545 05/24/16 0502 05/26/16 0512  WBC 148.7* 137.1*  --  67.7* 9.8  NEUTROABS 14.9* 8.2*  --   --   --   HGB 9.2* 9.3* 10.2* 7.2* 7.1*  HCT 28.4* 27.8* 30.0* 22.4* 21.8*  MCV 111.8* 111.2*  --  109.3* 109.0*  PLT 141* 136*  --  100* 77*   Basic Metabolic Panel:  Recent Labs Lab 05/23/16 1315 05/23/16 1534 05/23/16 1545 05/23/16 1731 05/24/16 0502 05/25/16 0500 05/25/16 0546 05/26/16 0512  NA 134* 133* 135  --  135  --  134* 140  K 7.4* 7.1* 7.1* 6.6* 4.6  --  5.0 3.9  CL 105 105 107  --  102  --  103 105  CO2 17* 16*  --   --  24  --  22 29  GLUCOSE 277* 315* 296*  --  239*  --  215* 139*  BUN 61* 65* 75*  --  44*  --  69* 28*  CREATININE 4.26* 4.45* 4.50*  --  2.94*  --  3.51* 2.10*  CALCIUM 7.7* 7.6*  --   --  7.3*  --  6.2* 6.7*  MG 2.3  --   --   --   --   --   --   --   PHOS  --  9.1*  --   --   --  10.0* 9.8* 4.4   GFR: Estimated Creatinine Clearance: 23.7 mL/min (by C-G  formula based on SCr of 2.1 mg/dL (H)). Liver Function Tests:  Recent Labs Lab 05/23/16 1315 05/23/16 1534 05/25/16 0546 05/26/16 0512  AST 72* 67*  --   --   ALT 25 23  --   --   ALKPHOS 75 76  --   --   BILITOT 0.4 0.4  --   --   PROT 7.0 7.2  --   --   ALBUMIN 3.7 3.7 2.9* 2.9*   No results for input(s): LIPASE, AMYLASE in the last 168 hours. No results for input(s): AMMONIA in the last 168 hours. Coagulation Profile: No results for input(s): INR, PROTIME in the last 168 hours. Cardiac Enzymes: No results for input(s): CKTOTAL, CKMB, CKMBINDEX, TROPONINI in the last 168 hours. BNP (last 3 results) No results for input(s): PROBNP in the last 8760 hours. HbA1C:  Recent Labs  05/24/16 0502  HGBA1C 6.5*   CBG:  Recent Labs Lab 05/25/16 2132 05/26/16 0804 05/26/16 1149 05/26/16 1605 05/26/16 1629  GLUCAP 162* 120* 126* 65 98   Lipid Profile: No results for input(s): CHOL, HDL, LDLCALC, TRIG, CHOLHDL, LDLDIRECT in the last 72 hours. Thyroid Function Tests: No results for input(s): TSH, T4TOTAL, FREET4, T3FREE, THYROIDAB in the last 72 hours. Anemia Panel: No results for input(s): VITAMINB12, FOLATE, FERRITIN, TIBC, IRON, RETICCTPCT in the last 72 hours. Urine analysis:    Component Value Date/Time   COLORURINE YELLOW 02/11/2015 1718   APPEARANCEUR CLEAR 02/11/2015 1718   LABSPEC 1.015 02/11/2015 1718   PHURINE 6.0 02/11/2015 1718   GLUCOSEU 250 (A) 02/11/2015 1718   HGBUR NEGATIVE 02/11/2015 1718   BILIRUBINUR NEGATIVE 02/11/2015 1718   KETONESUR NEGATIVE 02/11/2015 1718   PROTEINUR NEGATIVE 02/11/2015 1718   UROBILINOGEN 0.2 02/11/2015 1718   NITRITE NEGATIVE 02/11/2015 1718   LEUKOCYTESUR NEGATIVE 02/11/2015 1718   Sepsis Labs: @LABRCNTIP (procalcitonin:4,lacticidven:4)  ) Recent Results (from the past 240 hour(s))  MRSA PCR Screening     Status: None   Collection Time: 05/23/16  6:40 PM  Result Value Ref Range Status   MRSA by PCR NEGATIVE  NEGATIVE Final    Comment:        The GeneXpert MRSA Assay (FDA approved for NASAL specimens only), is one component of a comprehensive MRSA colonization surveillance program. It is not intended to diagnose MRSA  infection nor to guide or monitor treatment for MRSA infections.          Radiology Studies: No results found.      Scheduled Meds: . acyclovir  400 mg Oral Daily  . allopurinol  100 mg Oral BID  . cholecalciferol  400 Units Oral Daily  . heparin  5,000 Units Subcutaneous Q8H  . insulin aspart  0-15 Units Subcutaneous TID WC  . insulin aspart  0-5 Units Subcutaneous QHS  . insulin aspart  4 Units Subcutaneous TID WC  . rosuvastatin  40 mg Oral QHS  . sevelamer carbonate  1,600 mg Oral TID WC  . sodium bicarbonate  650 mg Oral BID   Continuous Infusions: . sodium chloride 75 mL/hr at 05/26/16 1708     LOS: 3 days    Time spent: 25 minutes. Greater than 50% of this time was spent in direct contact with the patient coordinating care.     Lelon Frohlich, MD [facility] Pager [phone]  If 7PM-7AM, please contact night-coverage [email or web address] Password [facility] 05/26/2016, 6:53 PM

## 2016-05-26 NOTE — Progress Notes (Signed)
Hypoglycemic Event  CBG: 65  Treatment: apple juice  Symptoms: none  Follow-up CBG: Time:1629 CBG Result: 98  Possible Reasons for Event:   decrease appetite and food intake

## 2016-05-27 LAB — RENAL FUNCTION PANEL
Albumin: 3.3 g/dL — ABNORMAL LOW (ref 3.5–5.0)
Anion gap: 5 (ref 5–15)
BUN: 30 mg/dL — AB (ref 6–20)
CHLORIDE: 105 mmol/L (ref 101–111)
CO2: 27 mmol/L (ref 22–32)
Calcium: 6.8 mg/dL — ABNORMAL LOW (ref 8.9–10.3)
Creatinine, Ser: 2.37 mg/dL — ABNORMAL HIGH (ref 0.61–1.24)
GFR calc Af Amer: 29 mL/min — ABNORMAL LOW (ref >60)
GFR calc non Af Amer: 25 mL/min — ABNORMAL LOW (ref >60)
GLUCOSE: 134 mg/dL — AB (ref 65–99)
POTASSIUM: 4.4 mmol/L (ref 3.5–5.1)
Phosphorus: 4 mg/dL (ref 2.5–4.6)
Sodium: 137 mmol/L (ref 135–145)

## 2016-05-27 LAB — BASIC METABOLIC PANEL
Anion gap: 5 (ref 5–15)
BUN: 29 mg/dL — ABNORMAL HIGH (ref 6–20)
CO2: 27 mmol/L (ref 22–32)
Calcium: 6.6 mg/dL — ABNORMAL LOW (ref 8.9–10.3)
Chloride: 106 mmol/L (ref 101–111)
Creatinine, Ser: 2.52 mg/dL — ABNORMAL HIGH (ref 0.61–1.24)
GFR calc Af Amer: 27 mL/min — ABNORMAL LOW (ref >60)
GFR calc non Af Amer: 23 mL/min — ABNORMAL LOW (ref >60)
Glucose, Bld: 102 mg/dL — ABNORMAL HIGH (ref 65–99)
Potassium: 4.5 mmol/L (ref 3.5–5.1)
Sodium: 138 mmol/L (ref 135–145)

## 2016-05-27 LAB — GLUCOSE, CAPILLARY
GLUCOSE-CAPILLARY: 122 mg/dL — AB (ref 65–99)
GLUCOSE-CAPILLARY: 110 mg/dL — AB (ref 65–99)
Glucose-Capillary: 91 mg/dL (ref 65–99)
Glucose-Capillary: 132 mg/dL — ABNORMAL HIGH (ref 65–99)
Glucose-Capillary: 61 mg/dL — ABNORMAL LOW (ref 65–99)

## 2016-05-27 LAB — PHOSPHORUS: Phosphorus: 4 mg/dL (ref 2.5–4.6)

## 2016-05-27 MED ORDER — ACYCLOVIR 200 MG PO CAPS
400.0000 mg | ORAL_CAPSULE | Freq: Every day | ORAL | Status: DC
  Administered 2016-05-27 – 2016-05-28 (×2): 400 mg via ORAL
  Filled 2016-05-27 (×2): qty 2

## 2016-05-27 MED ORDER — ACETAMINOPHEN 325 MG PO TABS
650.0000 mg | ORAL_TABLET | Freq: Four times a day (QID) | ORAL | Status: DC | PRN
  Administered 2016-05-27: 650 mg via ORAL
  Filled 2016-05-27: qty 2

## 2016-05-27 NOTE — Progress Notes (Signed)
PROGRESS NOTE    Randall Reyes  [id number] DOB: [birth year] DOA: 05/23/2016 PCP: [name], MD     Brief Narrative:  81 yo man admitted from home on 1/10 after oncology asked him to return to the hospital for hyperkalemia. Currently undergoing treatment for CLL. With significant bradycardia and failure to improve with medical treatment, decision was made for emergent hemodyalisis.   Assessment & Plan:   Principal Problem:   Hyperkalemia Active Problems:   CLL (chronic lymphocytic leukemia) (HCC)   DM (diabetes mellitus) ([place])   Port catheter in place   AKI (acute kidney injury) ([place])   HTN (hypertension)   Bigeminy   Hyperkalemia/?Tumor Lysis Syndrome -Improved with HD -K this am is 4.5  Bradycardia -Resolved after HD.  Hypotension -Resolved with IVF.  CLL -Continue OP f/u with oncology as scheduled. -WBC 67-137.  ARF/?Tumor Lysis Syndrome -Cr 2.52 -Has received a total of 2 dialysis sessions, no plans for dialysis at the moment. -Appreciate nephrology input and recommendations. -Keep femoral cath in one more day in case needs HD in am; consider DC in am.  DM -Controlled, increase sliding-scale to moderate, increasing the coverage to 4 units.   DVT prophylaxis: SQ heparin Code Status: full code Family Communication: son and DIL at bedside updated on plan of care and all questions answered Disposition Plan: to be determined pending medical course.  Consultants:   Nephrology  Procedures:   Emergent HD  Antimicrobials:  Anti-infectives    Start     Dose/Rate Route Frequency Ordered Stop   05/27/16 1000  acyclovir (ZOVIRAX) 200 MG capsule 400 mg     400 mg Oral Daily 05/27/16 0849     05/24/16 1000  acyclovir (ZOVIRAX) tablet 400 mg  Status:  Discontinued     400 mg Oral Daily 05/23/16 1900 05/27/16 0848       Subjective: No complaints.  Objective: Vitals:   05/26/16 2004 05/27/16 0500 05/27/16 0614 05/27/16 1412  BP: (!) 100/59  (!)  91/46 (!) 119/53  Pulse: (!) 58  (!) 54 62  Resp: 18  18 18   Temp: 97.4 F (36.3 C)  97.8 F (36.6 C) 97.8 F (36.6 C)  TempSrc: Oral  Oral Oral  SpO2: 98%  100% 100%  Weight:  66.9 kg (147 lb 9 oz)    Height:        Intake/Output Summary (Last 24 hours) at 05/27/16 1635 Last data filed at 05/27/16 1300  Gross per 24 hour  Intake             1065 ml  Output                0 ml  Net             1065 ml   Filed Weights   05/25/16 1045 05/26/16 0400 05/27/16 0500  Weight: 67.6 kg (149 lb 0.5 oz) 66 kg (145 lb 8.1 oz) 66.9 kg (147 lb 9 oz)    Examination:  General exam: Alert, awake, oriented x 3 Respiratory system: Clear to auscultation. Respiratory effort normal. Cardiovascular system:RRR. No murmurs, rubs, gallops. Gastrointestinal system: Abdomen is nondistended, soft and nontender. No organomegaly or masses felt. Normal bowel sounds heard. Central nervous system: Alert and oriented. No focal neurological deficits. Extremities: No C/C/E, +pedal pulses Skin: No rashes, lesions or ulcers Psychiatry: Judgement and insight appear normal. Mood & affect appropriate.     Data Reviewed: I have personally reviewed following labs and imaging studies  CBC:  Recent Labs Lab 05/23/16 1315 05/23/16 1534 05/23/16 1545 05/24/16 0502 05/26/16 0512  WBC 148.7* 137.1*  --  67.7* 9.8  NEUTROABS 14.9* 8.2*  --   --   --   HGB 9.2* 9.3* 10.2* 7.2* 7.1*  HCT 28.4* 27.8* 30.0* 22.4* 21.8*  MCV 111.8* 111.2*  --  109.3* 109.0*  PLT 141* 136*  --  100* 77*   Basic Metabolic Panel:  Recent Labs Lab 05/23/16 1315 05/23/16 1534 05/23/16 1545 05/23/16 1731 05/24/16 0502 05/25/16 0500 05/25/16 0546 05/26/16 0512 05/27/16 0823  NA 134* 133* 135  --  135  --  134* 140 138  K 7.4* 7.1* 7.1* 6.6* 4.6  --  5.0 3.9 4.5  CL 105 105 107  --  102  --  103 105 106  CO2 17* 16*  --   --  24  --  22 29 27   GLUCOSE 277* 315* 296*  --  239*  --  215* 139* 102*  BUN 61* 65* 75*  --  44*   --  69* 28* 29*  CREATININE 4.26* 4.45* 4.50*  --  2.94*  --  3.51* 2.10* 2.52*  CALCIUM 7.7* 7.6*  --   --  7.3*  --  6.2* 6.7* 6.6*  MG 2.3  --   --   --   --   --   --   --   --   PHOS  --  9.1*  --   --   --  10.0* 9.8* 4.4 4.0   GFR: Estimated Creatinine Clearance: 19.9 mL/min (by C-G formula based on SCr of 2.52 mg/dL (H)). Liver Function Tests:  Recent Labs Lab 05/23/16 1315 05/23/16 1534 05/25/16 0546 05/26/16 0512  AST 72* 67*  --   --   ALT 25 23  --   --   ALKPHOS 75 76  --   --   BILITOT 0.4 0.4  --   --   PROT 7.0 7.2  --   --   ALBUMIN 3.7 3.7 2.9* 2.9*   No results for input(s): LIPASE, AMYLASE in the last 168 hours. No results for input(s): AMMONIA in the last 168 hours. Coagulation Profile: No results for input(s): INR, PROTIME in the last 168 hours. Cardiac Enzymes: No results for input(s): CKTOTAL, CKMB, CKMBINDEX, TROPONINI in the last 168 hours. BNP (last 3 results) No results for input(s): PROBNP in the last 8760 hours. HbA1C: No results for input(s): HGBA1C in the last 72 hours. CBG:  Recent Labs Lab 05/26/16 1605 05/26/16 1629 05/26/16 2231 05/27/16 0755 05/27/16 1120  GLUCAP 65 98 117* 91 122*   Lipid Profile: No results for input(s): CHOL, HDL, LDLCALC, TRIG, CHOLHDL, LDLDIRECT in the last 72 hours. Thyroid Function Tests: No results for input(s): TSH, T4TOTAL, FREET4, T3FREE, THYROIDAB in the last 72 hours. Anemia Panel: No results for input(s): VITAMINB12, FOLATE, FERRITIN, TIBC, IRON, RETICCTPCT in the last 72 hours. Urine analysis:    Component Value Date/Time   COLORURINE YELLOW 02/11/2015 1718   APPEARANCEUR CLEAR 02/11/2015 1718   LABSPEC 1.015 02/11/2015 1718   PHURINE 6.0 02/11/2015 1718   GLUCOSEU 250 (A) 02/11/2015 1718   HGBUR NEGATIVE 02/11/2015 1718   BILIRUBINUR NEGATIVE 02/11/2015 1718   KETONESUR NEGATIVE 02/11/2015 1718   PROTEINUR NEGATIVE 02/11/2015 1718   UROBILINOGEN 0.2 02/11/2015 1718   NITRITE NEGATIVE  02/11/2015 1718   LEUKOCYTESUR NEGATIVE 02/11/2015 1718   Sepsis Labs: @LABRCNTIP (procalcitonin:4,lacticidven:4)  ) Recent Results (from the past 240 hour(s))  MRSA PCR Screening     Status: None   Collection Time: 05/23/16  6:40 PM  Result Value Ref Range Status   MRSA by PCR NEGATIVE NEGATIVE Final    Comment:        The GeneXpert MRSA Assay (FDA approved for NASAL specimens only), is one component of a comprehensive MRSA colonization surveillance program. It is not intended to diagnose MRSA infection nor to guide or monitor treatment for MRSA infections.          Radiology Studies: No results found.      Scheduled Meds: . acyclovir  400 mg Oral Daily  . allopurinol  100 mg Oral BID  . cholecalciferol  400 Units Oral Daily  . heparin  5,000 Units Subcutaneous Q8H  . insulin aspart  0-15 Units Subcutaneous TID WC  . insulin aspart  0-5 Units Subcutaneous QHS  . insulin aspart  4 Units Subcutaneous TID WC  . rosuvastatin  40 mg Oral QHS  . sevelamer carbonate  1,600 mg Oral TID WC  . sodium bicarbonate  650 mg Oral BID   Continuous Infusions: . sodium chloride 75 mL/hr at 05/27/16 0538     LOS: 4 days    Time spent: 25 minutes. Greater than 50% of this time was spent in direct contact with the patient coordinating care.     Lelon Frohlich, MD [facility] Pager [phone]  If 7PM-7AM, please contact night-coverage [email or web address] Password [facility] 05/27/2016, 4:35 PM

## 2016-05-27 NOTE — Progress Notes (Addendum)
Randall Reyes  MRN: [id number]  DOB/AGE: 19-Mar-1936 81 y.o.  Primary Care Physician:James Maudie Mercury, MD  Admit date: 05/23/2016  Chief Complaint:  Chief Complaint  Patient presents with  . hyperkalemia    S-Pt presented on  05/23/2016 with  Chief Complaint  Patient presents with  . hyperkalemia  .    Pt today feels better.    Pt son and daughter in [place] present in the room.  Meds . acyclovir  400 mg Oral Daily  . allopurinol  100 mg Oral BID  . cholecalciferol  400 Units Oral Daily  . heparin  5,000 Units Subcutaneous Q8H  . insulin aspart  0-15 Units Subcutaneous TID WC  . insulin aspart  0-5 Units Subcutaneous QHS  . insulin aspart  4 Units Subcutaneous TID WC  . rosuvastatin  40 mg Oral QHS  . sevelamer carbonate  1,600 mg Oral TID WC  . sodium bicarbonate  650 mg Oral BID      Physical Exam: Vital signs in last 24 hours: Temp:  [97.4 F (36.3 C)-97.8 F (36.6 C)] 97.8 F (36.6 C) (01/14 1412) Pulse Rate:  [54-65] 62 (01/14 1412) Resp:  [16-18] 18 (01/14 1412) BP: (91-119)/(46-59) 119/53 (01/14 1412) SpO2:  [98 %-100 %] 100 % (01/14 1412) Weight:  [147 lb 9 oz (66.9 kg)] 147 lb 9 oz (66.9 kg) (01/14 0500) Weight change: -1 lb 7.5 oz (-0.666 kg) Last BM Date: 05/23/16  Intake/Output from previous day: 01/13 0701 - 01/14 0700 In: 1740 [P.O.:240; I.V.:1500] Out: 500 [Urine:500] Total I/O In: 240 [P.O.:240] Out: -    Physical Exam: General- pt is awake,alert, oriented to time place and person Resp- No acute REsp distress, CTA B/L NO Rhonchi CVS- S1S2 regular in rate and rhythm GIT- BS+, soft, NT, ND EXT- NO LE Edema, NO Cyanosis   Lab Results: CBC  Recent Labs  05/26/16 0512  WBC 9.8  HGB 7.1*  HCT 21.8*  PLT 77*    BMET  Recent Labs  05/26/16 0512 05/27/16 0823  NA 140 138  K 3.9 4.5  CL 105 106  CO2 29 27  GLUCOSE 139* 102*  BUN 28* 29*  CREATININE 2.10* 2.52*  CALCIUM 6.7* 6.6*   Creat trend                        2018   4.45=>     2.1    =>2.5 2017  1.7--2.0 2016  1.5--2.6 2015  1.4--2.2 2014  1.2--1.5 2013  1.2--1.4 2012  1.3--2.0 2011  1.3 2010  1.3--2.2 2008   1.37 MICRO Recent Results (from the past 240 hour(s))  MRSA PCR Screening     Status: None   Collection Time: 05/23/16  6:40 PM  Result Value Ref Range Status   MRSA by PCR NEGATIVE NEGATIVE Final    Comment:        The GeneXpert MRSA Assay (FDA approved for NASAL specimens only), is one component of a comprehensive MRSA colonization surveillance program. It is not intended to diagnose MRSA infection nor to guide or monitor treatment for MRSA infections.       Lab Results  Component Value Date   PTH 39 09/13/2015   CALCIUM 6.6 (L) 05/27/2016   CAION 1.00 (L) 05/23/2016   PHOS 4.0 05/27/2016              Impression: 1)Renal  AKI secondary to prerenal/ATN/ AIN vs Tumor lysis syndrome  ATN - Non-oliguric                AKI on CKD               CKD stage 3/4.               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                 Nephrolithiasis Hx Absent                  Data in favour of TLL                HIgh Phos                Low calcium                WBC 137=>67                 Data against                  NON Oliguric AKI                 Uric acid low                Pt required Renal replacement therapy sec to Hyperkalemia and Hyperphosphatemia                 2)HTN  bp stable  3)Anemia HGb low but stable  4)CKD Mineral-Bone Disorder PTH acceptable. Secondary Hyperparathyroidism absent. Phosphorus  Now at goal.    On binders  5)Hyperkalemia    Sec to AKI on CKD.    Required HD    Now better  6)CVS- admitted with bradycardia     Pt has hx of Junctional rhythm    Now better  7)Acid base Co2 at goal       Plan:  No need of HD  today. Will continue current care Will follow bmet Will hold off on taking cath out, in case creat higher in am -may need to keep it as may need HD    Dezire Turk S 05/27/2016, 3:02 PM

## 2016-05-28 ENCOUNTER — Other Ambulatory Visit (HOSPITAL_COMMUNITY): Payer: Self-pay | Admitting: Pharmacist

## 2016-05-28 LAB — CBC
HCT: 23 % — ABNORMAL LOW (ref 39.0–52.0)
Hemoglobin: 7.6 g/dL — ABNORMAL LOW (ref 13.0–17.0)
MCH: 36.2 pg — ABNORMAL HIGH (ref 26.0–34.0)
MCHC: 33 g/dL (ref 30.0–36.0)
MCV: 109.5 fL — ABNORMAL HIGH (ref 78.0–100.0)
Platelets: 84 10*3/uL — ABNORMAL LOW (ref 150–400)
RBC: 2.1 MIL/uL — ABNORMAL LOW (ref 4.22–5.81)
RDW: 15.9 % — ABNORMAL HIGH (ref 11.5–15.5)
WBC: 6.9 10*3/uL (ref 4.0–10.5)

## 2016-05-28 LAB — BASIC METABOLIC PANEL
Anion gap: 3 — ABNORMAL LOW (ref 5–15)
BUN: 29 mg/dL — ABNORMAL HIGH (ref 6–20)
CO2: 26 mmol/L (ref 22–32)
Calcium: 6.5 mg/dL — ABNORMAL LOW (ref 8.9–10.3)
Chloride: 108 mmol/L (ref 101–111)
Creatinine, Ser: 2.28 mg/dL — ABNORMAL HIGH (ref 0.61–1.24)
GFR calc Af Amer: 30 mL/min — ABNORMAL LOW (ref >60)
GFR calc non Af Amer: 26 mL/min — ABNORMAL LOW (ref >60)
Glucose, Bld: 102 mg/dL — ABNORMAL HIGH (ref 65–99)
Potassium: 4.2 mmol/L (ref 3.5–5.1)
Sodium: 137 mmol/L (ref 135–145)

## 2016-05-28 LAB — PHOSPHORUS: Phosphorus: 3.5 mg/dL (ref 2.5–4.6)

## 2016-05-28 LAB — GLUCOSE, CAPILLARY
GLUCOSE-CAPILLARY: 120 mg/dL — AB (ref 65–99)
Glucose-Capillary: 101 mg/dL — ABNORMAL HIGH (ref 65–99)

## 2016-05-28 MED ORDER — HEPARIN SOD (PORK) LOCK FLUSH 100 UNIT/ML IV SOLN
500.0000 [IU] | INTRAVENOUS | Status: AC | PRN
  Administered 2016-05-28: 500 [IU]
  Filled 2016-05-28: qty 5

## 2016-05-28 MED ORDER — SEVELAMER CARBONATE 800 MG PO TABS: 1600.0000 mg | ORAL_TABLET | Freq: Three times a day (TID) | ORAL | 2 refills | Status: DC

## 2016-05-28 NOTE — Progress Notes (Signed)
Right femoral dialysis cath removed without difficulty. Pressure held for 10 minutes. Tolerated procedure well. May be discharged after one hour of observation.

## 2016-05-28 NOTE — Progress Notes (Signed)
Reviewed discharge instructions with pt and wife.  Answered questions at this time.

## 2016-05-28 NOTE — Discharge Summary (Signed)
Physician Discharge Summary  Randall Reyes [id number] DOB: [birth year] DOA: 05/23/2016  PCP: [name], MD  Admit date: 05/23/2016 Discharge date: 05/28/2016  Time spent: 45 minutes  Recommendations for Outpatient Follow-up:  -Will be discharged home today. -Will need to follow up with nephrology in 1 week.   Discharge Diagnoses:  Principal Problem:   Hyperkalemia Active Problems:   CLL (chronic lymphocytic leukemia) (HCC)   DM (diabetes mellitus) ([place])   Port catheter in place   AKI (acute kidney injury) ([place])   HTN (hypertension)   Bigeminy   Discharge Condition: Stable and improved  Filed Weights   05/26/16 0400 05/27/16 0500 05/28/16 0500  Weight: 66 kg (145 lb 8.1 oz) 66.9 kg (147 lb 9 oz) 66.7 kg (147 lb)    History of present illness:  As per Dr. Marin Comment on 1/10: Randall Reyes is an 81 y.o. male with hx of CLL, with WBC in the 200K, started chemotherapy, hx of CKD, HTN, DM, HTN, port-a-cath in place, found to have K of 7.4 with routine labs.  He has no CP, palpitation, or lightheadedness.  In the ER, repeat labs showed K of 7.1 with Cr of 4.0.  EKG showed bradycardia with bigeminy.  Dr Theador Hawthorne of nephrology was consulted.   He was given D50 and insulin along with calcium.  Repeat K was still 6.6, and hospitalist was asked to admit him for acute hyperkalemia with AKI, EKG changes and needing emergent hemodialysis.      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  Hypotension -Resolved with IVF.  CLL -Continue OP f/u with oncology as scheduled. -WBC with marked decrease follow 2 sessions of chemotherapy 130s-->6.9.  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 -Appreciate nephrology input and recommendations. -Femoral cath line has been taken out.  DM -Well  controlled.  Procedures:  HD x 2  Femoral HD catheter placement   Consultations:  Surgery  Nephrology  Discharge Instructions  Discharge Instructions    Diet - low sodium heart healthy    Complete by:  As directed    Increase activity slowly    Complete by:  As directed      Allergies as of 05/28/2016      Reactions   Aspirin Other (See Comments)   Bleeding ulcers.    Ibuprofen Other (See Comments)   Bleeding ulcers.      Medication List    STOP taking these medications   bendamustine in sodium chloride 0.9 % 50 mL   Cod Liver Oil w/Vit A & D Caps     TAKE these medications   ACCU-CHEK AVIVA PLUS test strip Generic drug:  glucose blood   ACCU-CHEK SOFTCLIX LANCETS lancets   acetaminophen 500 MG tablet Commonly known as:  TYLENOL Take 500 mg by mouth at bedtime as needed.   acyclovir 400 MG tablet Commonly known as:  ZOVIRAX Take 1 tablet (400 mg total) by mouth daily.   allopurinol 100 MG tablet Commonly known as:  ZYLOPRIM Take 1 tablet (100 mg total) by mouth 3 (three) times daily.   cholecalciferol 400 units Tabs tablet Commonly known as:  VITAMIN D Take 400 Units by mouth daily.   fluocinonide cream 0.05 % Commonly known as:  LIDEX APPLY TO AFFECTED AREA UP TO TWICE A DAY AS NEEDED NOT TO FACE, GROIN, UNDER  ARMS   IRON PO Take 1 tablet by mouth daily.   lidocaine-prilocaine cream Commonly known as:  EMLA Apply a quarter size amount to affected area 1 hour prior to coming to chemotherapy.   ondansetron 8 MG tablet Commonly known as:  ZOFRAN Take 1 tablet (8 mg total) by mouth every 8 (eight) hours as needed for nausea or vomiting.   prochlorperazine 10 MG tablet Commonly known as:  COMPAZINE Take 1 tablet (10 mg total) by mouth every 6 (six) hours as needed for nausea or vomiting.   RITUXAN IV Inject into the vein.   rosuvastatin 40 MG tablet Commonly known as:  CRESTOR Take 40 mg by mouth at bedtime.   sevelamer carbonate 800  MG tablet Commonly known as:  RENVELA Take 2 tablets (1,600 mg total) by mouth 3 (three) times daily with meals.   sitaGLIPtin 100 MG tablet Commonly known as:  JANUVIA Take 100 mg by mouth daily.   sodium bicarbonate 650 MG tablet TAKE 1 TABLET BY MOUTH TWICE A DAY AS DIRECTED      Allergies  Allergen Reactions  . Aspirin Other (See Comments)    Bleeding ulcers.   . Ibuprofen Other (See Comments)    Bleeding ulcers.   Follow-up Information    [name], MD. Schedule an appointment as soon as possible for a visit in 2 week(s).   Specialty:  Internal Medicine Contact information: [place] [address] [phone]        [facility], MD. Schedule an appointment as soon as possible for a visit.   Specialty:  Nephrology Contact information: [address] [place] [address] [phone]            The results of significant diagnostics from this hospitalization (including imaging, microbiology, ancillary and laboratory) are listed below for reference.    Significant Diagnostic Studies: US Renal  Result Date: 05/24/2016 CLINICAL DATA:  Acute tubular necrosis EXAM: RENAL / URINARY TRACT ULTRASOUND COMPLETE COMPARISON:  12/16/2015, 02/02/2015 FINDINGS: Right Kidney: Length: 8.3 cm. Echogenicity within normal limits. No mass or hydronephrosis visualized. Left Kidney: Length: 8.1 cm. Echogenicity within normal limits. No mass or hydronephrosis visualized. Bladder: Appears normal for degree of bladder distention. Note is made of small left pleural effusion. IMPRESSION: No acute abnormality noted in the kidneys. Small left pleural effusion Electronically Signed   By: Inez Catalina M.D.   On: 05/24/2016 15:32    Microbiology: Recent Results (from the past 240 hour(s))  MRSA PCR Screening     Status: None   Collection Time: 05/23/16  6:40 PM  Result Value Ref Range Status   MRSA by PCR NEGATIVE NEGATIVE Final    Comment:         The GeneXpert MRSA Assay (FDA approved for NASAL specimens only), is one component of a comprehensive MRSA colonization surveillance program. It is not intended to diagnose MRSA infection nor to guide or monitor treatment for MRSA infections.      Labs: Basic Metabolic Panel:  Recent Labs Lab 05/23/16 1315  05/25/16 0546 05/26/16 0512 05/27/16 0823 05/27/16 1646 05/28/16 0539  NA 134*  < > 134* 140 138 137 137  K 7.4*  < > 5.0 3.9 4.5 4.4 4.2  CL 105  < > 103 105 106 105 108  CO2 17*  < > 22 29 27 27 26   GLUCOSE 277*  < > 215* 139* 102* 134* 102*  BUN 61*  < > 69* 28* 29* 30*  29*  CREATININE 4.26*  < > 3.51* 2.10* 2.52* 2.37* 2.28*  CALCIUM 7.7*  < > 6.2* 6.7* 6.6* 6.8* 6.5*  MG 2.3  --   --   --   --   --   --   PHOS  --   < > 9.8* 4.4 4.0 4.0 3.5  < > = values in this interval not displayed. Liver Function Tests:  Recent Labs Lab 05/23/16 1315 05/23/16 1534 05/25/16 0546 05/26/16 0512 05/27/16 1646  AST 72* 67*  --   --   --   ALT 25 23  --   --   --   ALKPHOS 75 76  --   --   --   BILITOT 0.4 0.4  --   --   --   PROT 7.0 7.2  --   --   --   ALBUMIN 3.7 3.7 2.9* 2.9* 3.3*   No results for input(s): LIPASE, AMYLASE in the last 168 hours. No results for input(s): AMMONIA in the last 168 hours. CBC:  Recent Labs Lab 05/23/16 1315 05/23/16 1534 05/23/16 1545 05/24/16 0502 05/26/16 0512 05/28/16 0539  WBC 148.7* 137.1*  --  67.7* 9.8 6.9  NEUTROABS 14.9* 8.2*  --   --   --   --   HGB 9.2* 9.3* 10.2* 7.2* 7.1* 7.6*  HCT 28.4* 27.8* 30.0* 22.4* 21.8* 23.0*  MCV 111.8* 111.2*  --  109.3* 109.0* 109.5*  PLT 141* 136*  --  100* 77* 84*   Cardiac Enzymes: No results for input(s): CKTOTAL, CKMB, CKMBINDEX, TROPONINI in the last 168 hours. BNP: BNP (last 3 results) No results for input(s): BNP in the last 8760 hours.  ProBNP (last 3 results) No results for input(s): PROBNP in the last 8760 hours.  CBG:  Recent Labs Lab 05/27/16 1656  05/27/16 2031 05/27/16 2146 05/28/16 0733 05/28/16 1111  GLUCAP 132* 61* 110* 101* 120*       Signed:  HERNANDEZ ACOSTA,Dalia Jollie  [facility] Pager: [phone] 05/28/2016, 12:01 PM

## 2016-05-28 NOTE — Progress Notes (Signed)
Pt port heparin flushed and de accessed prior to discharge per protocol.  Telemetry removed.  Pt tolerated well.

## 2016-05-28 NOTE — Care Management Important Message (Signed)
Important Message  Patient Details  Name: ARLAND PATTILLO MRN: [id number] Date of Birth: 1936/05/08   Medicare Important Message Given:  Yes    Chester Sibert, Chauncey Reading, RN 05/28/2016, 1:43 PM

## 2016-05-28 NOTE — Progress Notes (Signed)
Randall Reyes  MRN: [id number]  DOB/AGE: 1936/03/23 81 y.o.  Primary Care Physician:James Maudie Mercury, MD  Admit date: 05/23/2016  Chief Complaint:  Chief Complaint  Patient presents with  . hyperkalemia    S-Pt presented on  05/23/2016 with  Chief Complaint  Patient presents with  . hyperkalemia  .    Pt today feels better. Pt offers no new complaints.    Pt main concern was " Can I go home today?'   Meds . acyclovir  400 mg Oral Daily  . allopurinol  100 mg Oral BID  . cholecalciferol  400 Units Oral Daily  . heparin  5,000 Units Subcutaneous Q8H  . insulin aspart  0-15 Units Subcutaneous TID WC  . insulin aspart  0-5 Units Subcutaneous QHS  . insulin aspart  4 Units Subcutaneous TID WC  . rosuvastatin  40 mg Oral QHS  . sevelamer carbonate  1,600 mg Oral TID WC  . sodium bicarbonate  650 mg Oral BID      Physical Exam: Vital signs in last 24 hours: Temp:  [97.7 F (36.5 C)-98.3 F (36.8 C)] 97.7 F (36.5 C) (01/15 0551) Pulse Rate:  [62-79] 79 (01/15 0551) Resp:  [18] 18 (01/15 0551) BP: (91-119)/(50-53) 91/51 (01/15 0551) SpO2:  [92 %-100 %] 92 % (01/15 0551) Weight:  [147 lb (66.7 kg)] 147 lb (66.7 kg) (01/15 0500) Weight change: -9 oz (-0.255 kg) Last BM Date: 05/26/16  Intake/Output from previous day: 01/14 0701 - 01/15 0700 In: 240 [P.O.:240] Out: -  No intake/output data recorded.   Physical Exam: General- pt is awake,alert, oriented to time place and person Resp- No acute REsp distress, CTA B/L NO Rhonchi CVS- S1S2 regular in rate and rhythm GIT- BS+, soft, NT, ND EXT- NO LE Edema, NO Cyanosis   Lab Results: CBC  Recent Labs  05/26/16 0512 05/28/16 0539  WBC 9.8 6.9  HGB 7.1* 7.6*  HCT 21.8* 23.0*  PLT 77* 84*    BMET  Recent Labs  05/27/16 1646 05/28/16 0539  NA 137 137  K 4.4 4.2  CL 105 108  CO2 27 26  GLUCOSE 134* 102*  BUN 30* 29*  CREATININE 2.37* 2.28*  CALCIUM 6.8* 6.5*   Creat trend                         2018  4.45=>     2.1    =>2.5=>2.28 2017  1.7--2.0 2016  1.5--2.6 2015  1.4--2.2 2014  1.2--1.5 2013  1.2--1.4 2012  1.3--2.0 2011  1.3 2010  1.3--2.2 2008   1.37 MICRO Recent Results (from the past 240 hour(s))  MRSA PCR Screening     Status: None   Collection Time: 05/23/16  6:40 PM  Result Value Ref Range Status   MRSA by PCR NEGATIVE NEGATIVE Final    Comment:        The GeneXpert MRSA Assay (FDA approved for NASAL specimens only), is one component of a comprehensive MRSA colonization surveillance program. It is not intended to diagnose MRSA infection nor to guide or monitor treatment for MRSA infections.       Lab Results  Component Value Date   PTH 39 09/13/2015   CALCIUM 6.5 (L) 05/28/2016   CAION 1.00 (L) 05/23/2016   PHOS 3.5 05/28/2016              Impression: 1)Renal  AKI secondary to prerenal/ATN/ AIN vs Tumor lysis syndrome  ATN - Non-oliguric                AKI on CKD               CKD stage 3/4.               CKD since 2008               CKD secondary to DM/ HTN                Progression of CKD now marked with AKI                 Nephrolithiasis Hx Absent               Pt required Renal replacement therapy sec to Hyperkalemia and Hyperphosphatemia                 2)HTN  bp stable  3)Anemia HGb stable   4)CKD Mineral-Bone Disorder PTH acceptable. Secondary Hyperparathyroidism absent. Phosphorus  Now at goal.    On binders  5)Hyperkalemia    Sec to AKI on CKD.    Required HD    Now better  6)[facility]- admitted with bradycardia     Pt has hx of Junctional rhythm    Now better  7)Acid base Co2 at goal       Plan:  Will d/c temporary cath Will continue current care Will follow bmet     [place] S 05/28/2016, 9:16 AM

## 2016-05-28 NOTE — Progress Notes (Addendum)
Per Dr Theador Hawthorne, Dr Rosana Hoes who is on call for General Surgery was called to notify that femoral cath can be removed.    Dr Arnoldo Morale in to see pt.

## 2016-05-28 NOTE — Care Management Note (Signed)
Case Management Note  Patient Details  Name: Randall Reyes MRN: [id number] Date of Birth: 1935/12/15   Expected Discharge Date:  05/28/16               Expected Discharge Plan:  [place]  In-House Referral:  NA  Discharge planning Services  CM Consult  Post Acute Care Choice:  Home Health Choice offered to:  Patient, Spouse  DME Arranged:    DME Agency:     HH Arranged:  RN Social Circle Agency:  [facility] (now [facility])  Status of Service:  Completed, signed off  If discussed at [employer] of Stay Meetings, dates discussed:    Additional Comments: Patient discharging today. Will need [facility] RN for follow up. Patient had emergent dialysis during admission, catheter removed today. Patient agreeable, offered choice. [name] notified and will obtain orders from chart. Patient aware that [facility] has 48 hours to initiate services.  [name], Chauncey Reading, RN 05/28/2016, 12:56 PM

## 2016-06-01 ENCOUNTER — Other Ambulatory Visit (HOSPITAL_COMMUNITY): Payer: Self-pay | Admitting: Oncology

## 2016-06-01 ENCOUNTER — Encounter (HOSPITAL_COMMUNITY): Payer: Medicare Other

## 2016-06-01 DIAGNOSIS — M310 Hypersensitivity angiitis: Secondary | ICD-10-CM | POA: Diagnosis present

## 2016-06-01 DIAGNOSIS — C9110 Chronic lymphocytic leukemia of B-cell type not having achieved remission: Secondary | ICD-10-CM

## 2016-06-01 LAB — COMPREHENSIVE METABOLIC PANEL
ALBUMIN: 3.3 g/dL — AB (ref 3.5–5.0)
ALT: 11 U/L — ABNORMAL LOW (ref 17–63)
AST: 15 U/L (ref 15–41)
Alkaline Phosphatase: 43 U/L (ref 38–126)
Anion gap: 6 (ref 5–15)
BILIRUBIN TOTAL: 0.5 mg/dL (ref 0.3–1.2)
BUN: 20 mg/dL (ref 6–20)
CO2: 27 mmol/L (ref 22–32)
Calcium: 7.9 mg/dL — ABNORMAL LOW (ref 8.9–10.3)
Chloride: 103 mmol/L (ref 101–111)
Creatinine, Ser: 2.33 mg/dL — ABNORMAL HIGH (ref 0.61–1.24)
GFR calc Af Amer: 29 mL/min — ABNORMAL LOW (ref >60)
GFR calc non Af Amer: 25 mL/min — ABNORMAL LOW (ref >60)
GLUCOSE: 107 mg/dL — AB (ref 65–99)
POTASSIUM: 4.6 mmol/L (ref 3.5–5.1)
SODIUM: 136 mmol/L (ref 135–145)
TOTAL PROTEIN: 6 g/dL — AB (ref 6.5–8.1)

## 2016-06-01 LAB — CBC WITH DIFFERENTIAL/PLATELET
BASOS PCT: 0 %
Basophils Absolute: 0 10*3/uL (ref 0.0–0.1)
EOS ABS: 0.3 10*3/uL (ref 0.0–0.7)
EOS PCT: 4 %
HCT: 21.6 % — ABNORMAL LOW (ref 39.0–52.0)
Hemoglobin: 7 g/dL — ABNORMAL LOW (ref 13.0–17.0)
LYMPHS ABS: 4.5 10*3/uL — AB (ref 0.7–4.0)
Lymphocytes Relative: 56 %
MCH: 35.7 pg — AB (ref 26.0–34.0)
MCHC: 32.4 g/dL (ref 30.0–36.0)
MCV: 110.2 fL — AB (ref 78.0–100.0)
MONO ABS: 1.1 10*3/uL — AB (ref 0.1–1.0)
Monocytes Relative: 14 %
NEUTROS ABS: 2.1 10*3/uL (ref 1.7–7.7)
NEUTROS PCT: 26 %
PLATELETS: 117 10*3/uL — AB (ref 150–400)
RBC: 1.96 MIL/uL — ABNORMAL LOW (ref 4.22–5.81)
RDW: 16.1 % — AB (ref 11.5–15.5)
WBC: 8 10*3/uL (ref 4.0–10.5)

## 2016-06-01 LAB — URIC ACID: Uric Acid, Serum: 2.2 mg/dL — ABNORMAL LOW (ref 4.4–7.6)

## 2016-06-04 ENCOUNTER — Other Ambulatory Visit (HOSPITAL_COMMUNITY): Payer: Self-pay

## 2016-06-04 ENCOUNTER — Encounter (HOSPITAL_COMMUNITY): Payer: Medicare Other

## 2016-06-04 DIAGNOSIS — C9110 Chronic lymphocytic leukemia of B-cell type not having achieved remission: Secondary | ICD-10-CM

## 2016-06-04 LAB — PREPARE RBC (CROSSMATCH)

## 2016-06-05 ENCOUNTER — Encounter (HOSPITAL_COMMUNITY): Payer: Self-pay

## 2016-06-05 ENCOUNTER — Encounter (HOSPITAL_COMMUNITY)
Admission: RE | Admit: 2016-06-05 | Discharge: 2016-06-05 | Disposition: A | Discharge status: Home / Self Care | Payer: Medicare Other | Source: Ambulatory Visit | Attending: Hematology & Oncology | Admitting: Hematology & Oncology

## 2016-06-05 DIAGNOSIS — C9110 Chronic lymphocytic leukemia of B-cell type not having achieved remission: Secondary | ICD-10-CM | POA: Insufficient documentation

## 2016-06-05 MED ORDER — HEPARIN SOD (PORK) LOCK FLUSH 100 UNIT/ML IV SOLN: 250.0000 [IU] | INTRAVENOUS | Status: DC | PRN

## 2016-06-05 MED ORDER — SODIUM CHLORIDE 0.9 % IV SOLN
250.0000 mL | Freq: Once | INTRAVENOUS | Status: AC
  Administered 2016-06-05: 250 mL via INTRAVENOUS

## 2016-06-05 MED ORDER — SODIUM CHLORIDE 0.9% FLUSH: 3.0000 mL | INTRAVENOUS | Status: DC | PRN

## 2016-06-05 MED ORDER — DIPHENHYDRAMINE HCL 25 MG PO CAPS
25.0000 mg | ORAL_CAPSULE | Freq: Once | ORAL | Status: AC
  Administered 2016-06-05: 25 mg via ORAL
  Filled 2016-06-05: qty 1

## 2016-06-05 MED ORDER — HEPARIN SOD (PORK) LOCK FLUSH 100 UNIT/ML IV SOLN
500.0000 [IU] | Freq: Every day | INTRAVENOUS | Status: AC | PRN
  Administered 2016-06-05: 500 [IU]
  Filled 2016-06-05: qty 5

## 2016-06-05 MED ORDER — SODIUM CHLORIDE 0.9% FLUSH: 10.0000 mL | INTRAVENOUS | Status: DC | PRN

## 2016-06-05 MED ORDER — ACETAMINOPHEN 325 MG PO TABS
650.0000 mg | ORAL_TABLET | Freq: Once | ORAL | Status: AC
  Administered 2016-06-05: 650 mg via ORAL
  Filled 2016-06-05: qty 2

## 2016-06-06 LAB — TYPE AND SCREEN
BLOOD PRODUCT EXPIRATION DATE: 201801232359
Blood Product Expiration Date: 201802062359
ISSUE DATE / TIME: 201801230826
ISSUE DATE / TIME: 201801231019
UNIT TYPE AND RH: 6200
Unit Type and Rh: 9500

## 2016-06-07 ENCOUNTER — Encounter: Payer: Self-pay | Admitting: Cardiology

## 2016-06-07 ENCOUNTER — Encounter (HOSPITAL_COMMUNITY): Payer: Medicare Other

## 2016-06-07 ENCOUNTER — Other Ambulatory Visit (HOSPITAL_COMMUNITY): Payer: Medicare Other

## 2016-06-07 DIAGNOSIS — C9110 Chronic lymphocytic leukemia of B-cell type not having achieved remission: Secondary | ICD-10-CM

## 2016-06-07 DIAGNOSIS — D649 Anemia, unspecified: Secondary | ICD-10-CM

## 2016-06-07 LAB — COMPREHENSIVE METABOLIC PANEL
ALBUMIN: 4.3 g/dL (ref 3.5–5.0)
ALT: 13 U/L — ABNORMAL LOW (ref 17–63)
ANION GAP: 8 (ref 5–15)
AST: 16 U/L (ref 15–41)
Alkaline Phosphatase: 55 U/L (ref 38–126)
BILIRUBIN TOTAL: 0.7 mg/dL (ref 0.3–1.2)
BUN: 22 mg/dL — ABNORMAL HIGH (ref 6–20)
CO2: 25 mmol/L (ref 22–32)
Calcium: 8.9 mg/dL (ref 8.9–10.3)
Chloride: 102 mmol/L (ref 101–111)
Creatinine, Ser: 2.2 mg/dL — ABNORMAL HIGH (ref 0.61–1.24)
GFR calc Af Amer: 31 mL/min — ABNORMAL LOW (ref >60)
GFR, EST NON AFRICAN AMERICAN: 27 mL/min — AB (ref >60)
Glucose, Bld: 108 mg/dL — ABNORMAL HIGH (ref 65–99)
POTASSIUM: 5 mmol/L (ref 3.5–5.1)
Sodium: 135 mmol/L (ref 135–145)
TOTAL PROTEIN: 7.4 g/dL (ref 6.5–8.1)

## 2016-06-07 LAB — PHOSPHORUS: Phosphorus: 3.5 mg/dL (ref 2.5–4.6)

## 2016-06-07 LAB — CBC WITH DIFFERENTIAL/PLATELET
BASOS ABS: 0 10*3/uL (ref 0.0–0.1)
Basophils Relative: 0 %
Eosinophils Absolute: 0.4 10*3/uL (ref 0.0–0.7)
Eosinophils Relative: 4 %
HEMATOCRIT: 36.8 % — AB (ref 39.0–52.0)
Hemoglobin: 12.3 g/dL — ABNORMAL LOW (ref 13.0–17.0)
LYMPHS ABS: 6.5 10*3/uL — AB (ref 0.7–4.0)
Lymphocytes Relative: 62 %
MCH: 34.9 pg — ABNORMAL HIGH (ref 26.0–34.0)
MCHC: 33.4 g/dL (ref 30.0–36.0)
MCV: 104.5 fL — ABNORMAL HIGH (ref 78.0–100.0)
MONOS PCT: 17 %
Monocytes Absolute: 1.8 10*3/uL — ABNORMAL HIGH (ref 0.1–1.0)
NEUTROS ABS: 1.8 10*3/uL (ref 1.7–7.7)
Neutrophils Relative %: 17 %
Platelets: 124 10*3/uL — ABNORMAL LOW (ref 150–400)
RBC: 3.52 MIL/uL — ABNORMAL LOW (ref 4.22–5.81)
RDW: 18.6 % — AB (ref 11.5–15.5)
WBC: 10.5 10*3/uL (ref 4.0–10.5)

## 2016-06-07 LAB — MAGNESIUM: MAGNESIUM: 2 mg/dL (ref 1.7–2.4)

## 2016-06-07 LAB — URIC ACID: URIC ACID, SERUM: 2 mg/dL — AB (ref 4.4–7.6)

## 2016-06-07 LAB — SAMPLE TO BLOOD BANK

## 2016-06-10 ENCOUNTER — Other Ambulatory Visit (HOSPITAL_COMMUNITY): Payer: Self-pay | Admitting: Oncology

## 2016-06-11 ENCOUNTER — Encounter (HOSPITAL_BASED_OUTPATIENT_CLINIC_OR_DEPARTMENT_OTHER): Payer: Medicare Other

## 2016-06-11 VITALS — BP 118/67 | HR 67 | Temp 97.4°F | Resp 18

## 2016-06-11 DIAGNOSIS — E538 Deficiency of other specified B group vitamins: Secondary | ICD-10-CM | POA: Diagnosis not present

## 2016-06-11 MED ORDER — CYANOCOBALAMIN 1000 MCG/ML IJ SOLN
1000.0000 ug | Freq: Once | INTRAMUSCULAR | Status: AC
  Administered 2016-06-11: 1000 ug via INTRAMUSCULAR

## 2016-06-11 MED ORDER — CYANOCOBALAMIN 1000 MCG/ML IJ SOLN
INTRAMUSCULAR | Status: AC
  Filled 2016-06-11: qty 1

## 2016-06-11 NOTE — Progress Notes (Signed)
Randall Reyes tolerated Vit B12 injection well without complaints or incident. VSS Pt discharged self ambulatory in satisfactory condition accompanied by his wife

## 2016-06-11 NOTE — Patient Instructions (Signed)
[facility] Discharge Instructions  RECOMMENDATIONS MADE BY THE CONSULTANT AND ANY TEST RESULTS WILL BE SENT TO YOUR REFERRING PHYSICIAN.  Received Vit B12 injection today. Follow-up as scheduled. Call clinic for any questions or concerns  Thank you for choosing  Cancer Center at [facility] to provide your oncology and hematology care.  To afford each patient quality time with our provider, please arrive at least 15 minutes before your scheduled appointment time.    If you have a lab appointment with the Cancer Center please come in thru the  Main Entrance and check in at the main information desk  You need to re-schedule your appointment should you arrive 10 or more minutes late.  We strive to give you quality time with our providers, and arriving late affects you and other patients whose appointments are after yours.  Also, if you no show three or more times for appointments you may be dismissed from the clinic at the providers discretion.     Again, thank you for choosing [facility].  Our hope is that these requests will decrease the amount of time that you wait before being seen by our physicians.       _____________________________________________________________  Should you have questions after your visit to [facility], please contact our office at [phone] between the hours of 8:30 a.m. and 4:30 p.m.  Voicemails left after 4:30 p.m. will not be returned until the following business day.  For prescription refill requests, have your pharmacy contact our office.       Resources For Cancer Patients and their Caregivers ? American Cancer Society: Can assist with transportation, wigs, general needs, runs Look Good Feel Better.        1-888-227-6333 ? Cancer Care: Provides financial assistance, online support groups, medication/co-pay assistance.  1-800-813-HOPE (4673) ? [facility] Assists [place] cancer patients and their families through emotional , educational and financial support.  336-427-4357 ? [place] DSS Where to apply for food stamps, Medicaid and utility assistance. 336-342-1394 ? [facility]: Transportation to medical appointments. 336-347-2287 ? Social Security Administration: May apply for disability if have a Stage IV cancer. 336-342-7796 1-800-772-1213 ? [place] Aging, Disability and Transit Services: Assists with nutrition, care and transit needs. 336-349-2343  Cancer Center Support Programs: @10RELATIVEDAYS@ > Cancer Support Group  2nd Tuesday of the month 1pm-2pm, Journey Room  > Creative Journey  3rd Tuesday of the month 1130am-1pm, Journey Room  > Look Good Feel Better  1st Wednesday of the month 10am-12 noon, Journey Room (Call American Cancer Society to register 1-800-395-5775)   

## 2016-06-14 ENCOUNTER — Other Ambulatory Visit (HOSPITAL_COMMUNITY): Payer: Self-pay | Admitting: *Deleted

## 2016-06-14 ENCOUNTER — Encounter: Payer: Self-pay | Admitting: Cardiovascular Disease

## 2016-06-14 ENCOUNTER — Ambulatory Visit (INDEPENDENT_AMBULATORY_CARE_PROVIDER_SITE_OTHER): Payer: Medicare Other | Admitting: Cardiovascular Disease

## 2016-06-14 VITALS — BP 98/52 | HR 70 | Ht 62.0 in | Wt 131.8 lb

## 2016-06-14 DIAGNOSIS — I4891 Unspecified atrial fibrillation: Secondary | ICD-10-CM | POA: Diagnosis not present

## 2016-06-14 DIAGNOSIS — Z7984 Long term (current) use of oral hypoglycemic drugs: Secondary | ICD-10-CM | POA: Diagnosis not present

## 2016-06-14 DIAGNOSIS — E440 Moderate protein-calorie malnutrition: Secondary | ICD-10-CM | POA: Diagnosis not present

## 2016-06-14 DIAGNOSIS — E1122 Type 2 diabetes mellitus with diabetic chronic kidney disease: Secondary | ICD-10-CM | POA: Diagnosis not present

## 2016-06-14 DIAGNOSIS — Z9289 Personal history of other medical treatment: Secondary | ICD-10-CM | POA: Diagnosis not present

## 2016-06-14 DIAGNOSIS — D649 Anemia, unspecified: Secondary | ICD-10-CM | POA: Diagnosis not present

## 2016-06-14 DIAGNOSIS — N189 Chronic kidney disease, unspecified: Secondary | ICD-10-CM | POA: Diagnosis not present

## 2016-06-14 DIAGNOSIS — I129 Hypertensive chronic kidney disease with stage 1 through stage 4 chronic kidney disease, or unspecified chronic kidney disease: Secondary | ICD-10-CM | POA: Diagnosis not present

## 2016-06-14 DIAGNOSIS — C9110 Chronic lymphocytic leukemia of B-cell type not having achieved remission: Secondary | ICD-10-CM | POA: Diagnosis not present

## 2016-06-14 DIAGNOSIS — E883 Tumor lysis syndrome: Secondary | ICD-10-CM | POA: Diagnosis not present

## 2016-06-14 DIAGNOSIS — S301XXD Contusion of abdominal wall, subsequent encounter: Secondary | ICD-10-CM | POA: Diagnosis not present

## 2016-06-14 DIAGNOSIS — E538 Deficiency of other specified B group vitamins: Secondary | ICD-10-CM | POA: Diagnosis not present

## 2016-06-14 NOTE — Patient Instructions (Addendum)
Your physician recommends that you schedule a follow-up appointment in: 2 months with Dr Bronson Ing    Your physician has requested that you have an echocardiogram. Echocardiography is a painless test that uses sound waves to create images of your heart. It provides your doctor with information about the size and shape of your heart and how well your heart's chambers and valves are working. This procedure takes approximately one hour. There are no restrictions for this procedure.     Your physician has recommended that you wear an event monitor. Event monitors are medical devices that record the heart's electrical activity. Doctors most often [place] these monitors to diagnose arrhythmias. Arrhythmias are problems with the speed or rhythm of the heartbeat. The monitor is a small, portable device. You can wear one while you do your normal daily activities. This is usually used to diagnose what is causing palpitations/syncope (passing out).      Thank you for choosing [facility] !

## 2016-06-14 NOTE — Progress Notes (Signed)
CARDIOLOGY CONSULT NOTE  Patient ID: Randall Reyes MRN: [id number] DOB/AGE: 11-23-1935 81 y.o.  Admit date: (Not on file) Primary Physician: [name], MD Referring Physician:   Reason for Consultation: arrhythmia  HPI: 81 yr old male with CLL on chemotherapy, CKD, hypertension, and diabetes recently hospitalized for tumor lysis syndrome. ECG 05/24/16: Sinus bradycardia, HR 53 bpm, 1st degree AV block (PR 266 ms), and RBBB.  However, ECG on January 10th showed atrial fibrillation. An additional ECG on January 10th showed sinus bradycardia, heart rate 56 bpm, right bundle branch block, left anterior fascicular block, and possible old inferior infarct. Atrial fibrillation occurred in the setting of tumor lysis syndrome with BUN 61, creatinine 4.26, hemoglobin 9.2, potassium 7.4, TSH 2.3, and magnesium 2.3.  He denies exertional chest pain and shortness of breath. He also denies palpitations, lightheadedness, dizziness, syncope, orthopnea, and paroxysmal nocturnal dyspnea.  He is feeling very well today.   Allergies  Allergen Reactions  . Aspirin Other (See Comments)    Bleeding ulcers.   . Ibuprofen Other (See Comments)    Bleeding ulcers.    Current Outpatient Prescriptions  Medication Sig Dispense Refill  . ACCU-CHEK AVIVA PLUS test strip     . ACCU-CHEK SOFTCLIX LANCETS lancets     . acetaminophen (TYLENOL) 500 MG tablet Take 500 mg by mouth at bedtime as needed.     [name] allopurinol (ZYLOPRIM) 100 MG tablet Take 1 tablet (100 mg total) by mouth 3 (three) times daily. 90 tablet 1  . cholecalciferol (VITAMIN D) 400 units TABS tablet Take 400 Units by mouth daily.    [name] docusate sodium (COLACE) 100 MG capsule Take 100 mg by mouth daily.    . fluocinonide cream (LIDEX) 0.05 % APPLY TO AFFECTED AREA UP TO TWICE A DAY AS NEEDED NOT TO FACE, GROIN, UNDER ARMS  3  . IRON PO Take 1 tablet by mouth daily.    [name] lidocaine-prilocaine (EMLA) cream Apply a quarter size amount to  affected area 1 hour prior to coming to chemotherapy. 30 g 2  . ondansetron (ZOFRAN) 8 MG tablet Take 1 tablet (8 mg total) by mouth every 8 (eight) hours as needed for nausea or vomiting. 30 tablet 2  . rosuvastatin (CRESTOR) 40 MG tablet Take 40 mg by mouth at bedtime.     . sevelamer carbonate (RENVELA) 800 MG tablet Take 2 tablets (1,600 mg total) by mouth 3 (three) times daily with meals. 180 tablet 2  . sitaGLIPtin (JANUVIA) 100 MG tablet Take 100 mg by mouth daily.    . sodium bicarbonate 650 MG tablet TAKE 1 TABLET BY MOUTH TWICE A DAY AS DIRECTED  3   No current facility-administered medications for this visit.    Facility-Administered Medications Ordered in Other Visits  Medication Dose Route Frequency Provider Last Rate Last Dose  . rasburicase (ELITEK) 6 mg in sodium chloride 0.9 % 46 mL IVPB  6 mg Intravenous Once Baird Cancer, PA-C        Past Medical History:  Diagnosis Date  . Anemia   . B12 deficiency 12/07/2014  . CLL (chronic lymphocytic leukemia) ([place]) 01/24/2011  . Diabetes mellitus   . DM (diabetes mellitus) ([place]) 01/24/2011  . Eczema 01/24/2011  . History of pneumonia 04/2014  . Hypertension   . Leukemia ([place]) 6.22.2012   PER PATIENT  . Melanoma in situ ([place]) 01/24/2011  . Mild obesity 01/24/2011  . Port catheter in place 05/02/2012  . Shingles   .  Ulcer [facility])     Past Surgical History:  Procedure Laterality Date  . bleeding ulcer    . CATARACT EXTRACTION W/PHACO  05/05/2012   Procedure: CATARACT EXTRACTION PHACO AND INTRAOCULAR LENS PLACEMENT (IOC);  Surgeon: Tonny Branch, MD;  Location: [facility] ORS;  Service: Ophthalmology;  Laterality: Right;  CDE:13.25  . CATARACT EXTRACTION W/PHACO  05/15/2012   Procedure: CATARACT EXTRACTION PHACO AND INTRAOCULAR LENS PLACEMENT (IOC);  Surgeon: Tonny Branch, MD;  Location: [facility] ORS;  Service: Ophthalmology;  Laterality: Left;  CDE:  12.32  . COLONOSCOPY N/A 01/30/2016   Procedure: COLONOSCOPY;  Surgeon: Danie Binder, MD;   Location: [facility] ENDO SUITE;  Service: Endoscopy;  Laterality: N/A;  2:00 PM - moved to 1:00 - office notified pt  . ESOPHAGOGASTRODUODENOSCOPY  2008   Dr. Oneida Alar: normal esophagus, antral erythema, 1 cm clean based duodenal ulcera, negative H. plyori  . ESOPHAGOGASTRODUODENOSCOPY N/A 01/30/2016   Procedure: ESOPHAGOGASTRODUODENOSCOPY (EGD);  Surgeon: Danie Binder, MD;  Location: [facility] END[facility] SU[facility];  Service: Endoscopy;  Laterality: N/A;  . HERNIA REPAIR  2001  . PORTACATH PLACEMENT  2008    Social History   Social History  . Marital status: Married    Spouse name: N/A  . Number of children: N/A  . Years of education: N/A   Occupational History  . Not on file.   Social History Main Topics  . Smoking status: Former Research scientist (life sciences)  . Smokeless tobacco: Former Systems developer    Quit date: 04/25/1967  . Alcohol use No  . Drug use: No  . Sexual activity: Not on file   Other Topics Concern  . Not on file   Social History Narrative  . No narrative on file     No family history of premature CAD in 1st degree relatives.  Prior to Admission medications   Medication Sig Start Date End Date Taking? Authorizing Provider  ACCU-CHEK AVIVA PLUS test strip  10/14/15   Historical Provider, MD  ACCU-CHEK SOFTCLIX LANCETS lancets  11/30/15   Historical Provider, MD  acetaminophen (TYLENOL) 500 MG tablet Take 500 mg by mouth at bedtime as needed.     Historical Provider, MD  allopurinol (ZYLOPRIM) 100 MG tablet Take 1 tablet (100 mg total) by mouth 3 (three) times daily. 05/09/16   Baird Cancer, PA-C  cholecalciferol (VITAMIN D) 400 units TABS tablet Take 400 Units by mouth daily.    Historical Provider, MD  fluocinonide cream (LIDEX) 0.05 % APPLY TO AFFECTED AREA UP TO TWICE A DAY AS NEEDED NOT TO FACE, GROIN, UNDER ARMS 11/25/14   Historical Provider, MD  IRON PO Take 1 tablet by mouth daily.    Historical Provider, MD  lidocaine-prilocaine (EMLA) cream Apply a quarter size amount to affected area 1 hour prior to  coming to chemotherapy. 05/10/16   Patrici Ranks, MD  ondansetron (ZOFRAN) 8 MG tablet Take 1 tablet (8 mg total) by mouth every 8 (eight) hours as needed for nausea or vomiting. 05/10/16   Patrici Ranks, MD  prochlorperazine (COMPAZINE) 10 MG tablet Take 1 tablet (10 mg total) by mouth every 6 (six) hours as needed for nausea or vomiting. 05/10/16   Patrici Ranks, MD  RiTUXimab (RITUXAN IV) Inject into the vein.    Historical Provider, MD  rosuvastatin (CRESTOR) 40 MG tablet Take 40 mg by mouth at bedtime.  02/21/16   Historical Provider, MD  sevelamer carbonate (RENVELA) 800 MG tablet Take 2 tablets (1,600 mg total) by mouth 3 (three) times  daily with meals. 05/28/16   Erline Hau, MD  sitaGLIPtin (JANUVIA) 100 MG tablet Take 100 mg by mouth daily.    Historical Provider, MD  sodium bicarbonate 650 MG tablet TAKE 1 TABLET BY MOUTH TWICE A DAY AS DIRECTED 04/24/16   Historical Provider, MD     Review of systems complete and found to be negative unless listed above in HPI     Physical exam Blood pressure (!) 98/52, pulse 70, height 5\' 2"  (1.575 m), weight 131 lb 12.8 oz (59.8 kg), SpO2 98 %. General: NAD Neck: No JVD, no thyromegaly or thyroid nodule.  Lungs: Faint inspiratory wheezes, no rales. CV: Nondisplaced PMI. Regular rate and rhythm, normal S1/S2, no S3/S4, no murmur.  No peripheral edema.   Abdomen: Soft, nontender, no distention.  Skin: Intact without lesions or rashes.  Neurologic: Alert and oriented x 3.  Psych: Normal affect. Extremities: No clubbing or cyanosis.  HEENT: Normal.   ECG: Most recent ECG reviewed.  Labs:   Lab Results  Component Value Date   WBC 10.5 06/07/2016   HGB 12.3 (L) 06/07/2016   HCT 36.8 (L) 06/07/2016   MCV 104.5 (H) 06/07/2016   PLT 124 (L) 06/07/2016     Recent Labs Lab 06/07/16 1121  NA 135  K 5.0  CL 102  CO2 25  BUN 22*  CREATININE 2.20*  CALCIUM 8.9  PROT 7.4  BILITOT 0.7  ALKPHOS 55  ALT 13*    AST 16  GLUCOSE 108*   Lab Results  Component Value Date   CKTOTAL 309 (H) 11/03/2010   CKMB 3.8 11/03/2010   TROPONINI <0.03 08/09/2014   No results found for: CHOL No results found for: HDL No results found for: LDLCALC No results found for: TRIG No results found for: CHOLHDL No results found for: LDLDIRECT       Studies: No results found.  ASSESSMENT AND PLAN:  1. Atrial fibrillation: Arrhythmias occurred in the setting of tumor lysis syndrome with marked hyperkalemia, 7.4. Arrhythmias resolved after electrolyte correction. He is asymptomatic. I will obtain an echocardiogram to evaluate cardiac structure and function and left atrial size, as well as a 2 week event monitor to determine if there is any further evidence of atrial fibrillation.  Dispo: fu 2 months   Signed: [name], M.D., F.A.C.C.  06/14/2016, 8:33 AM

## 2016-06-15 ENCOUNTER — Encounter (HOSPITAL_COMMUNITY): Payer: Medicare Other | Attending: Oncology

## 2016-06-15 DIAGNOSIS — C9110 Chronic lymphocytic leukemia of B-cell type not having achieved remission: Secondary | ICD-10-CM | POA: Insufficient documentation

## 2016-06-15 LAB — CBC WITH DIFFERENTIAL/PLATELET
Basophils Absolute: 0 10*3/uL (ref 0.0–0.1)
Basophils Relative: 0 %
EOS ABS: 0.9 10*3/uL — AB (ref 0.0–0.7)
Eosinophils Relative: 8 %
HCT: 36.2 % — ABNORMAL LOW (ref 39.0–52.0)
Hemoglobin: 11.8 g/dL — ABNORMAL LOW (ref 13.0–17.0)
LYMPHS ABS: 7.5 10*3/uL — AB (ref 0.7–4.0)
Lymphocytes Relative: 67 %
MCH: 34.5 pg — AB (ref 26.0–34.0)
MCHC: 32.6 g/dL (ref 30.0–36.0)
MCV: 105.8 fL — ABNORMAL HIGH (ref 78.0–100.0)
MONO ABS: 1.7 10*3/uL — AB (ref 0.1–1.0)
Monocytes Relative: 15 %
NEUTROS PCT: 10 %
Neutro Abs: 1.1 10*3/uL — ABNORMAL LOW (ref 1.7–7.7)
PLATELETS: 120 10*3/uL — AB (ref 150–400)
RBC: 3.42 MIL/uL — ABNORMAL LOW (ref 4.22–5.81)
RDW: 17 % — AB (ref 11.5–15.5)
WBC: 11.2 10*3/uL — AB (ref 4.0–10.5)

## 2016-06-15 LAB — COMPREHENSIVE METABOLIC PANEL
ALT: 20 U/L (ref 17–63)
ANION GAP: 4 — AB (ref 5–15)
AST: 27 U/L (ref 15–41)
Albumin: 4.2 g/dL (ref 3.5–5.0)
Alkaline Phosphatase: 67 U/L (ref 38–126)
BUN: 34 mg/dL — ABNORMAL HIGH (ref 6–20)
CHLORIDE: 104 mmol/L (ref 101–111)
CO2: 27 mmol/L (ref 22–32)
Calcium: 8.7 mg/dL — ABNORMAL LOW (ref 8.9–10.3)
Creatinine, Ser: 2.38 mg/dL — ABNORMAL HIGH (ref 0.61–1.24)
GFR calc non Af Amer: 25 mL/min — ABNORMAL LOW (ref >60)
GFR, EST AFRICAN AMERICAN: 28 mL/min — AB (ref >60)
Glucose, Bld: 118 mg/dL — ABNORMAL HIGH (ref 65–99)
POTASSIUM: 5.3 mmol/L — AB (ref 3.5–5.1)
SODIUM: 135 mmol/L (ref 135–145)
Total Bilirubin: 0.5 mg/dL (ref 0.3–1.2)
Total Protein: 7.3 g/dL (ref 6.5–8.1)

## 2016-06-15 LAB — URIC ACID: URIC ACID, SERUM: 2.2 mg/dL — AB (ref 4.4–7.6)

## 2016-06-15 LAB — SAMPLE TO BLOOD BANK

## 2016-06-15 LAB — VITAMIN B12: VITAMIN B 12: 1746 pg/mL — AB (ref 180–914)

## 2016-06-19 ENCOUNTER — Ambulatory Visit (INDEPENDENT_AMBULATORY_CARE_PROVIDER_SITE_OTHER): Payer: Medicare Other

## 2016-06-19 ENCOUNTER — Encounter (HOSPITAL_COMMUNITY): Payer: Medicare Other

## 2016-06-19 ENCOUNTER — Encounter (HOSPITAL_COMMUNITY): Payer: Medicare Other | Attending: Oncology | Admitting: Oncology

## 2016-06-19 ENCOUNTER — Telehealth: Payer: Self-pay

## 2016-06-19 ENCOUNTER — Encounter (HOSPITAL_COMMUNITY): Payer: Self-pay | Admitting: Oncology

## 2016-06-19 ENCOUNTER — Ambulatory Visit (HOSPITAL_COMMUNITY): Payer: Medicare Other | Admitting: Hematology & Oncology

## 2016-06-19 ENCOUNTER — Ambulatory Visit (HOSPITAL_COMMUNITY)
Admission: RE | Admit: 2016-06-19 | Discharge: 2016-06-19 | Disposition: A | Discharge status: Home / Self Care | Payer: Medicare Other | Source: Ambulatory Visit | Attending: Cardiovascular Disease | Admitting: Cardiovascular Disease

## 2016-06-19 ENCOUNTER — Ambulatory Visit (HOSPITAL_COMMUNITY): Payer: Medicare Other

## 2016-06-19 VITALS — BP 128/72 | HR 65 | Temp 97.6°F | Resp 18 | Wt 133.1 lb

## 2016-06-19 DIAGNOSIS — C439 Malignant melanoma of skin, unspecified: Secondary | ICD-10-CM | POA: Insufficient documentation

## 2016-06-19 DIAGNOSIS — I071 Rheumatic tricuspid insufficiency: Secondary | ICD-10-CM | POA: Diagnosis not present

## 2016-06-19 DIAGNOSIS — E119 Type 2 diabetes mellitus without complications: Secondary | ICD-10-CM | POA: Diagnosis not present

## 2016-06-19 DIAGNOSIS — I4891 Unspecified atrial fibrillation: Secondary | ICD-10-CM | POA: Insufficient documentation

## 2016-06-19 DIAGNOSIS — I340 Nonrheumatic mitral (valve) insufficiency: Secondary | ICD-10-CM | POA: Diagnosis not present

## 2016-06-19 DIAGNOSIS — C9110 Chronic lymphocytic leukemia of B-cell type not having achieved remission: Secondary | ICD-10-CM | POA: Diagnosis not present

## 2016-06-19 DIAGNOSIS — I371 Nonrheumatic pulmonary valve insufficiency: Secondary | ICD-10-CM | POA: Diagnosis not present

## 2016-06-19 DIAGNOSIS — I10 Essential (primary) hypertension: Secondary | ICD-10-CM | POA: Diagnosis not present

## 2016-06-19 DIAGNOSIS — E538 Deficiency of other specified B group vitamins: Secondary | ICD-10-CM

## 2016-06-19 LAB — COMPREHENSIVE METABOLIC PANEL
ALK PHOS: 67 U/L (ref 38–126)
ALT: 25 U/L (ref 17–63)
ANION GAP: 6 (ref 5–15)
AST: 26 U/L (ref 15–41)
Albumin: 4.3 g/dL (ref 3.5–5.0)
BILIRUBIN TOTAL: 0.6 mg/dL (ref 0.3–1.2)
BUN: 30 mg/dL — ABNORMAL HIGH (ref 6–20)
CALCIUM: 8.8 mg/dL — AB (ref 8.9–10.3)
CO2: 29 mmol/L (ref 22–32)
CREATININE: 2.14 mg/dL — AB (ref 0.61–1.24)
Chloride: 103 mmol/L (ref 101–111)
GFR, EST AFRICAN AMERICAN: 32 mL/min — AB (ref >60)
GFR, EST NON AFRICAN AMERICAN: 28 mL/min — AB (ref >60)
Glucose, Bld: 106 mg/dL — ABNORMAL HIGH (ref 65–99)
Potassium: 4.6 mmol/L (ref 3.5–5.1)
SODIUM: 138 mmol/L (ref 135–145)
TOTAL PROTEIN: 7 g/dL (ref 6.5–8.1)

## 2016-06-19 LAB — CBC WITH DIFFERENTIAL/PLATELET
BASOS PCT: 0 %
Basophils Absolute: 0 10*3/uL (ref 0.0–0.1)
EOS ABS: 0.9 10*3/uL — AB (ref 0.0–0.7)
EOS PCT: 9 %
HEMATOCRIT: 34.5 % — AB (ref 39.0–52.0)
HEMOGLOBIN: 11.4 g/dL — AB (ref 13.0–17.0)
LYMPHS PCT: 66 %
Lymphs Abs: 6.4 10*3/uL — ABNORMAL HIGH (ref 0.7–4.0)
MCH: 34.7 pg — AB (ref 26.0–34.0)
MCHC: 33 g/dL (ref 30.0–36.0)
MCV: 104.9 fL — AB (ref 78.0–100.0)
Monocytes Absolute: 1.2 10*3/uL — ABNORMAL HIGH (ref 0.1–1.0)
Monocytes Relative: 12 %
NEUTROS ABS: 1.3 10*3/uL — AB (ref 1.7–7.7)
Neutrophils Relative %: 13 %
Platelets: 115 10*3/uL — ABNORMAL LOW (ref 150–400)
RBC: 3.29 MIL/uL — ABNORMAL LOW (ref 4.22–5.81)
RDW: 16.5 % — ABNORMAL HIGH (ref 11.5–15.5)
WBC: 9.8 10*3/uL (ref 4.0–10.5)

## 2016-06-19 LAB — ECHOCARDIOGRAM COMPLETE
E decel time: 261 msec
E/e' ratio: 8.63
FS: 42 % (ref 28–44)
IV/PV OW: 0.95
LA diam end sys: 26 mm
LA vol: 32.6 mL
LADIAMINDEX: 1.63 cm/m2
LASIZE: 26 mm
LAVOLA4C: 32.1 mL
LAVOLIN: 20.4 mL/m2
LV E/e'average: 8.63
LV SIMPSON'S DISK: 64
LV TDI E'LATERAL: 8.81
LV sys vol: 23 mL
LVDIAVOL: 62 mL (ref 62–150)
LVDIAVOLIN: 39 mL/m2
LVEEMED: 8.63
LVELAT: 8.81 cm/s
LVOT VTI: 22 cm
LVOT area: 2.54 cm2
LVOT diameter: 18 mm
LVOT peak grad rest: 5 mmHg
LVOT peak vel: 109 cm/s
LVOTSV: 56 mL
LVSYSVOLIN: 14 mL/m2
Lateral S' vel: 14.4 cm/s
MV Dec: 261
MV pk A vel: 101 m/s
MV pk E vel: 76 m/s
MVPG: 2 mmHg
P 1/2 time: 429 ms
PW: 7.87 mm — AB (ref 0.6–1.1)
RV TAPSE: 20.7 mm
RV sys press: 25 mmHg
Reg peak vel: 233 cm/s
Stroke v: 40 ml
TDI e' medial: 8.27
TRMAXVEL: 233 cm/s
Weight: 2129.6 oz

## 2016-06-19 LAB — URIC ACID: URIC ACID, SERUM: 2.2 mg/dL — AB (ref 4.4–7.6)

## 2016-06-19 LAB — LACTATE DEHYDROGENASE: LDH: 133 U/L (ref 98–192)

## 2016-06-19 LAB — PHOSPHORUS: PHOSPHORUS: 3.4 mg/dL (ref 2.5–4.6)

## 2016-06-19 NOTE — Progress Notes (Signed)
*  PRELIMINARY RESULTS* Echocardiogram 2D Echocardiogram has been performed.  Randall Reyes 06/19/2016, 11:16 AM

## 2016-06-19 NOTE — Progress Notes (Signed)
Randall Gravel, MD [address] [place] [address]  CLL (chronic lymphocytic leukemia) [facility]) - Plan: CBC with Differential, Comprehensive metabolic panel, Lactate dehydrogenase, Uric acid, Phosphorus, CANCELED: CBC with Differential, CANCELED: Comprehensive metabolic panel, CANCELED: Lactate dehydrogenase, CANCELED: Phosphorus, CANCELED: Uric acid  B12 deficiency  CURRENT THERAPY: Surveillance  INTERVAL HISTORY: Randall Reyes 81 y.o. male returns for followup of CLL, diagnosed in 2006 and per records was treated with FCR in 2010 secondary to a rapid doubling time of his WBC count up to 290K with diffuse LAD, hypogammaglobulinemia. He only received 3 cycles of FCR secondary to prolonged pancytopenia.  In Jan 2015, he was started on Imbruvica for a rising wbc and increased fatigue; other counts were relatively preserved. WBC count at that time was 36K. Therapy was interrupted secondary to rash. He was then started on Idelalisib on 07/31/2013. This therapy was held secondary to severe zoster outbreak diagnosed on 12/28/2013. Patient was given Varicella Zoster Immune Globulin IM on 01/13/2014. He was then restarted on Imbruvica on 02/08/2014. He was dose reduced secondary to abnormal LFT's and now therapy has been discontinued secondary to pneumonia and generalized decline. Due to significant WBC doubling and progressive anemia he required initiation of systemic chemotherapy.  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    CLL (chronic lymphocytic leukemia) ([place])   02/14/2005 Procedure    Peripheral flow cytometry- CLL (without 11q or 17 p deletion) presenting with stage 0 disease      10/25/2008 Progression    Increasing WBC and symptoms suggestive of progressive disease      11/09/2008 - 01/07/2009  Chemotherapy    Rituxan, fludarabine, cytoxan x 3 cycles with the discontinuation of fludarabine and cytoxan due to severe pancytopenia requiring hospitalization.      01/10/2009 Adverse Reaction    Severe pancytopenia and adverse side effects      02/01/2009 - 04/01/2009 Chemotherapy    Rituxan every 21 days x 3 cycles      04/02/2009 Remission    Stable disease.  Chemotherapy holiday started      06/22/2013 - 06/29/2013 Chemotherapy    Imbruvica daily      06/29/2013 Adverse Reaction    Large right posterior leg (popliteal) ecchymosis/violaceous rash.  Likely Imbruvica-induced      07/31/2013 - 10/12/2013 Chemotherapy    Zydelig (idelalisib) 150 mg BID monotherapy      10/12/2013 Adverse Reaction    Transaminitis, suspected to be secondary to [place].  Dose decreased.      10/12/2013 - 11/09/2013 Chemotherapy    Zydelig (idelalisib) 150 mg daily.      11/09/2013 Adverse Reaction    Worsening transaminitis.  Zydelig on HOLD      11/09/2013 Treatment Plan Change    Hold Zydelig      12/28/2013 Adverse Reaction    Herpes Zoster outbreak.  Partial response with Famvir x 1 week, switched to acyclovir.  Progression over 1-2 weeks with development of keratoconjunctivitis bilaterally.  VZ Immune Globulin 625 mg IM given on 01/13/14.      02/08/2014 Treatment Plan Change    Ibrutinib restarted 140 mg daily for one week followed by 280 mg daily followed by 420 mg daily with prophylactic acyclovir.      02/08/2014 - 07/05/2014 Chemotherapy    Ibrutinib 420 mg daily  1Dec 26, 202015 - 04/17/2014 Hospital Admission    Right lower lobe pneumonia      07/05/2014 Adverse Reaction    Fissuring of fingertips and sore, red feet with transaminitis      07/05/2014 Treatment Plan Change    Hold Ibrutinib      07/22/2014 - 08/19/2014 Chemotherapy    Ibrutinib restarted at 280 mg daily.      08/09/2014 - 08/12/2014 Hospital Admission    Bilateral pneumonia      08/19/2014 Adverse Reaction     Nausea with vomiting.  Escorted to ED.      08/19/2014 Treatment Plan Change    Ibrutinib on hold- ED escort from clinic       10/18/2014 Miscellaneous    B12 weekly x 4 then monthly.      12/15/2015 Imaging    Bone density- BMD as determined from Femur Neck Left is 0.803 g/cm2 with a T-score of -2.1. This patient is considered osteopenic by World Healh Organization (WHO) Criteria.      12/16/2015 Imaging    CT abd/pelvis-  Progression of retroperitoneal and portacaval adenopathy compared to the prior study. Likely related to CLL.  Small hiatal hernia  Mild compression fractures T11 and T12, probably old. Correlate with any pain in this area.      05/22/2016 - 05/28/2016 Chemotherapy    BR day 1 and 2 x 1 cycle.  Bendamustine dose reduced by 50%.      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       He is doing well.  He denies any complaints.  He is provided education regarding tumor lysis syndrome which was the cause of his recent hospitalization.  I reviewed the mechanism of tumor lysis syndrome and how it lead to AKI and dialysis x 2.    His wife reports some constipation.  He is taking stool softener daily and laxative QOD.  He is moing his bowels QOD.  He would like to go daily like he is used to.  I provided him education regarding bowel regimen.  He is educated that he will no longer be given ongoing chemotherapy given complications with his first cycle of chemotherapy.  Review of Systems  Constitutional: Negative.  Negative for chills, fever and weight loss.  HENT: Negative.   Eyes: Negative.   Respiratory: Negative.  Negative for cough.   Cardiovascular: Negative.  Negative for chest pain.  Gastrointestinal: Positive for constipation. Negative for diarrhea,  nausea and vomiting.  Genitourinary: Negative.   Musculoskeletal: Negative.   Skin: Negative.   Neurological: Negative.  Negative for weakness.  Endo/Heme/Allergies: Negative.   Psychiatric/Behavioral: Negative.     Past Medical History:  Diagnosis Date  . Anemia   . B12 deficiency 12/07/2014  . CLL (chronic lymphocytic leukemia) ([place]) 01/24/2011  . Diabetes mellitus   . DM (diabetes mellitus) ([place]) 01/24/2011  . Eczema 01/24/2011  . History of pneumonia 04/2014  . Hypertension   . Leukemia ([place]) 6.22.2012   PER PATIENT  . Melanoma in situ ([place]) 01/24/2011  . Mild obesity 01/24/2011  . Port catheter in place 05/02/2012  . Shingles   . Ulcer [facility])     Past Surgical History:  Procedure Laterality Date  . bleeding ulcer    . CATARACT EXTRACTION W/PHACO  05/05/2012   Procedure: CATARACT EXTRACTION PHACO  AND INTRAOCULAR LENS PLACEMENT (IOC);  Surgeon: Tonny Branch, MD;  Location: [facility] ORS;  Service: Ophthalmology;  Laterality: Right;  CDE:13.25  . CATARACT EXTRACTION W/PHACO  05/15/2012   Procedure: CATARACT EXTRACTION PHACO AND INTRAOCULAR LENS PLACEMENT (IOC);  Surgeon: Tonny Branch, MD;  Location: [facility] ORS;  Service: Ophthalmology;  Laterality: Left;  CDE:  12.32  . COLONOSCOPY N/A 01/30/2016   Procedure: COLONOSCOPY;  Surgeon: Danie Binder, MD;  Location: [facility] ENDO SUITE;  Service: Endoscopy;  Laterality: N/A;  2:00 PM - moved to 1:00 - office notified pt  . ESOPHAGOGASTRODUODENOSCOPY  2008   Dr. Oneida Alar: normal esophagus, antral erythema, 1 cm clean based duodenal ulcera, negative H. plyori  . ESOPHAGOGASTRODUODENOSCOPY N/A 01/30/2016   Procedure: ESOPHAGOGASTRODUODENOSCOPY (EGD);  Surgeon: Danie Binder, MD;  Location: [facility];  Service: Endoscopy;  Laterality: N/A;  . HERNIA REPAIR  2001  . PORTACATH PLACEMENT  2008    Family History  Problem Relation Age of Onset  . Diabetes Father   . Colon cancer Neg Hx     Social History   Social History  . Marital status: Married     Spouse name: N/A  . Number of children: N/A  . Years of education: N/A   Social History Main Topics  . Smoking status: Former Research scientist (life sciences)  . Smokeless tobacco: Former Systems developer    Quit date: 04/25/1967  . Alcohol use No  . Drug use: No  . Sexual activity: Not Asked   Other Topics Concern  . None   Social History Narrative  . None     PHYSICAL EXAMINATION  ECOG PERFORMANCE STATUS: 1 - Symptomatic but completely ambulatory  Vitals:   06/19/16 0955  BP: 128/72  Pulse: 65  Resp: 18  Temp: 97.6 F (36.4 C)    GENERAL:alert, no distress, well nourished, well developed, comfortable, cooperative, smiling and accompanied by his wife. SKIN: skin color, texture, turgor are normal, no rashes or significant lesions HEAD: Normocephalic, No masses, lesions, tenderness or abnormalities EYES: normal, EOMI, Conjunctiva are pink and non-injected EARS: External ears normal OROPHARYNX:lips, buccal mucosa, and tongue normal and mucous membranes are moist  NECK: supple, no adenopathy, trachea midline LYMPH:  no palpable lymphadenopathy BREAST:not examined LUNGS: clear to auscultation  HEART: regular rate & rhythm ABDOMEN:abdomen soft and normal bowel sounds BACK: Back symmetric, no curvature. EXTREMITIES:less then 2 second capillary refill, no joint deformities, effusion, or inflammation, no skin discoloration, no cyanosis  NEURO: alert & oriented x 3 with fluent speech, no focal motor/sensory deficits, gait normal    LABORATORY DATA: CBC    Component Value Date/Time   WBC 9.8 06/19/2016 1117   RBC 3.29 (L) 06/19/2016 1117   HGB 11.4 (L) 06/19/2016 1117   HCT 34.5 (L) 06/19/2016 1117   PLT 115 (L) 06/19/2016 1117   MCV 104.9 (H) 06/19/2016 1117   MCH 34.7 (H) 06/19/2016 1117   MCHC 33.0 06/19/2016 1117   RDW 16.5 (H) 06/19/2016 1117   LYMPHSABS 6.4 (H) 06/19/2016 1117   MONOABS 1.2 (H) 06/19/2016 1117   EOSABS 0.9 (H) 06/19/2016 1117   BASOSABS 0.0 06/19/2016 1117      Chemistry        Component Value Date/Time   NA 138 06/19/2016 1117   K 4.6 06/19/2016 1117   CL 103 06/19/2016 1117   CO2 29 06/19/2016 1117   BUN 30 (H) 06/19/2016 1117   CREATININE 2.14 (H) 06/19/2016 1117      Component Value Date/Time   CALCIUM  8.8 (L) 06/19/2016 1117   CALCIUM 8.3 (L) 02/03/2015 0639   ALKPHOS 67 06/19/2016 1117   AST 26 06/19/2016 1117   ALT 25 06/19/2016 1117   BILITOT 0.6 06/19/2016 1117        PENDING LABS:   RADIOGRAPHIC STUDIES:  US Renal  Result Date: 06-15-2016 CLINICAL DATA:  Acute tubular necrosis EXAM: RENAL / URINARY TRACT ULTRASOUND COMPLETE COMPARISON:  12/16/2015, 02/02/2015 FINDINGS: Right Kidney: Length: 8.3 cm. Echogenicity within normal limits. No mass or hydronephrosis visualized. Left Kidney: Length: 8.1 cm. Echogenicity within normal limits. No mass or hydronephrosis visualized. Bladder: Appears normal for degree of bladder distention. Note is made of small left pleural effusion. IMPRESSION: No acute abnormality noted in the kidneys. Small left pleural effusion Electronically Signed   By: Inez Catalina M.D.   On: 06-15-2016 15:32     PATHOLOGY:    ASSESSMENT AND PLAN:  CLL (chronic lymphocytic leukemia) ([place]) CLL, diagnosed in 2006 and per records was treated with FCR in 2010 secondary to a rapid doubling time of his WBC count up to 290K with diffuse LAD, hypogammaglobulinemia. He only received 3 cycles of FCR secondary to prolonged pancytopenia.  In Jan 2015, he was started on Imbruvica for a rising wbc and increased fatigue; other counts were relatively preserved. WBC count at that time was 36K. Therapy was interrupted secondary to rash. He was then started on Idelalisib on 07/31/2013. This therapy was held secondary to severe zoster outbreak diagnosed on 12/28/2013. Patient was given Varicella Zoster Immune Globulin IM on 01/13/2014. He was then restarted on Imbruvica on 02/08/2014. He was dose reduced secondary to abnormal LFT's and now therapy has  been discontinued secondary to pneumonia and generalized decline. Due to significant WBC doubling and progressive anemia he required initiation of systemic chemotherapy.  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  Labs today: CBC diff, CMET, LDH, phosphorus, uric acid.  I personally reviewed and went over laboratory results with the patient.  The results are noted within this dictation.  Labs weekly: CBC diff, CMET, LDH, phosphorus, uric acid.  Patient provided constipation sheet.  He continues to follow with Fran Lowes, MD (Nephrology).   He is not a candidate for aggressive systemic chemotherapy in the future given his complications associated with BR (dose reduced by 50%).  In the future, possible treatment with single-agent Rituxan could be considered.  His kidneys just cannot tolerate any insult.  Return in 6 weeks for follow-up.  B12 deficiency B12 deficiency with negative intrinsic factor antibody and anti-parietal cell antibody on 10/18/2014.  Now on monthly B12 IM replacement.  B12 injection is due in ~ 3 weeks.  Supportive therapy plan reviewed.   ORDERS PLACED FOR THIS ENCOUNTER: Orders Placed This Encounter  Procedures  . CBC with Differential  . Comprehensive metabolic panel  . Lactate dehydrogenase  . Uric acid  . Phosphorus    MEDICATIONS PRESCRIBED THIS ENCOUNTER: Meds ordered this encounter  Medications  . prochlorperazine (COMPAZINE) 10 MG tablet    Sig: TAKE 1 TABLET (10 MG TOTAL) BY MOUTH EVERY 6 (SIX) HOURS AS NEEDED FOR NAUSEA OR VOMITING.    Refill:  2  . acyclovir (ZOVIRAX) 400 MG tablet    THERAPY PLAN:  Continue with ongoing surveillance.  B12 injection monthly.  All questions were answered. The patient knows to call the clinic with  any problems, questions or concerns.  We can certainly see the patient much sooner if necessary.  Patient and plan discussed with Dr. Twana First and she is in agreement with the aforementioned.   This note is electronically signed by: Doy Mince 06/19/2016 6:11 PM

## 2016-06-19 NOTE — Telephone Encounter (Signed)
-----   Message from Herminio Commons, MD sent at 06/19/2016  1:31 PM EST ----- Normal pumping function with mild valve leakage. Good results overall.

## 2016-06-19 NOTE — Assessment & Plan Note (Addendum)
CLL, diagnosed in 2006 and per records was treated with FCR in 2010 secondary to a rapid doubling time of his WBC count up to 290K with diffuse LAD, hypogammaglobulinemia. He only received 3 cycles of FCR secondary to prolonged pancytopenia.  In Jan 2015, he was started on Imbruvica for a rising wbc and increased fatigue; other counts were relatively preserved. WBC count at that time was 36K. Therapy was interrupted secondary to rash. He was then started on Idelalisib on 07/31/2013. This therapy was held secondary to severe zoster outbreak diagnosed on 12/28/2013. Patient was given Varicella Zoster Immune Globulin IM on 01/13/2014. He was then restarted on Imbruvica on 02/08/2014. He was dose reduced secondary to abnormal LFT's and now therapy has been discontinued secondary to pneumonia and generalized decline. Due to significant WBC doubling and progressive anemia he required initiation of systemic chemotherapy.  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  Labs today: CBC diff, CMET, LDH, phosphorus, uric acid.  I personally reviewed and went over laboratory results with the patient.  The results are noted within this dictation.  Labs weekly: CBC diff, CMET, LDH, phosphorus, uric acid.  Patient provided constipation sheet.  He continues to follow with Fran Lowes, MD (Nephrology).   He is not a candidate for aggressive systemic chemotherapy in the future given his complications associated with BR (dose reduced by 50%).  In the future, possible treatment with single-agent Rituxan could be considered.  His kidneys just cannot tolerate any insult.  Return in 6 weeks for follow-up.

## 2016-06-19 NOTE — Patient Instructions (Signed)
[place] at [facility] Discharge Instructions  RECOMMENDATIONS MADE BY THE CONSULTANT AND ANY TEST RESULTS WILL BE SENT TO YOUR REFERRING PHYSICIAN.  You were seen today by Kirby Crigler, PA-C Lab work weekly Follow up in 6 weeks  Thank you for choosing [place] at [facility] to provide your oncology and hematology care.  To afford each patient quality time with our provider, please arrive at least 15 minutes before your scheduled appointment time.    If you have a lab appointment with the [facility] please come in thru the  Main Entrance and check in at the main information desk  You need to re-schedule your appointment should you arrive 10 or more minutes late.  We strive to give you quality time with our providers, and arriving late affects you and other patients whose appointments are after yours.  Also, if you no show three or more times for appointments you may be dismissed from the clinic at the providers discretion.     Again, thank you for choosing [facility].  Our hope is that these requests will decrease the amount of time that you wait before being seen by our physicians.       _____________________________________________________________  Should you have questions after your visit to [facility], please contact our office at [phone] between the hours of 8:30 a.m. and 4:30 p.m.  Voicemails left after 4:30 p.m. will not be returned until the following business day.  For prescription refill requests, have your pharmacy contact our office.       Resources For Cancer Patients and their Caregivers ? American Cancer Society: Can assist with transportation, wigs, general needs, runs Look Good Feel Better.        346 109 7894 ? Cancer Care: Provides financial assistance, online support groups, medication/co-pay assistance.  1-800-813-HOPE 424-400-2219) ? [facility] Assists  [place] cancer patients and their families through emotional , educational and financial support.  858-026-7591 ? [place] DSS Where to apply for food stamps, Medicaid and utility assistance. 917 405 5603 ? [employer]: Transportation to medical appointments. 419-135-7041 ? Social Security Administration: May apply for disability if have a Stage IV cancer. (347)694-2884 240 010 0674 ? [employer], Disability and Transit Services: Assists with nutrition, care and transit needs. [place] Support Programs: @10RELATIVEDAYS @ > Cancer Support Group  2nd Tuesday of the month 1pm-2pm, Journey Room  > Creative Journey  3rd Tuesday of the month 1130am-1pm, Journey Room  > Look Good Feel Better  1st Wednesday of the month 10am-12 noon, Journey Room (Call [place] to register [phone])

## 2016-06-19 NOTE — Assessment & Plan Note (Signed)
B12 deficiency with negative intrinsic factor antibody and anti-parietal cell antibody on 10/18/2014.  Now on monthly B12 IM replacement.  B12 injection is due in ~ 3 weeks.  Supportive therapy plan reviewed.

## 2016-06-20 ENCOUNTER — Encounter: Payer: Self-pay | Admitting: Cardiovascular Disease

## 2016-06-20 ENCOUNTER — Ambulatory Visit (HOSPITAL_COMMUNITY): Payer: Medicare Other

## 2016-06-20 DIAGNOSIS — Z7984 Long term (current) use of oral hypoglycemic drugs: Secondary | ICD-10-CM | POA: Diagnosis not present

## 2016-06-20 DIAGNOSIS — E440 Moderate protein-calorie malnutrition: Secondary | ICD-10-CM | POA: Diagnosis not present

## 2016-06-20 DIAGNOSIS — E1122 Type 2 diabetes mellitus with diabetic chronic kidney disease: Secondary | ICD-10-CM | POA: Diagnosis not present

## 2016-06-20 DIAGNOSIS — E538 Deficiency of other specified B group vitamins: Secondary | ICD-10-CM | POA: Diagnosis not present

## 2016-06-20 DIAGNOSIS — N189 Chronic kidney disease, unspecified: Secondary | ICD-10-CM | POA: Diagnosis not present

## 2016-06-20 DIAGNOSIS — S301XXD Contusion of abdominal wall, subsequent encounter: Secondary | ICD-10-CM | POA: Diagnosis not present

## 2016-06-20 DIAGNOSIS — D649 Anemia, unspecified: Secondary | ICD-10-CM | POA: Diagnosis not present

## 2016-06-20 DIAGNOSIS — C9110 Chronic lymphocytic leukemia of B-cell type not having achieved remission: Secondary | ICD-10-CM | POA: Diagnosis not present

## 2016-06-20 DIAGNOSIS — I129 Hypertensive chronic kidney disease with stage 1 through stage 4 chronic kidney disease, or unspecified chronic kidney disease: Secondary | ICD-10-CM | POA: Diagnosis not present

## 2016-06-22 DIAGNOSIS — C9110 Chronic lymphocytic leukemia of B-cell type not having achieved remission: Secondary | ICD-10-CM | POA: Diagnosis not present

## 2016-06-22 DIAGNOSIS — E1122 Type 2 diabetes mellitus with diabetic chronic kidney disease: Secondary | ICD-10-CM | POA: Diagnosis not present

## 2016-06-22 DIAGNOSIS — N189 Chronic kidney disease, unspecified: Secondary | ICD-10-CM | POA: Diagnosis not present

## 2016-06-22 DIAGNOSIS — I129 Hypertensive chronic kidney disease with stage 1 through stage 4 chronic kidney disease, or unspecified chronic kidney disease: Secondary | ICD-10-CM | POA: Diagnosis not present

## 2016-06-22 DIAGNOSIS — S301XXD Contusion of abdominal wall, subsequent encounter: Secondary | ICD-10-CM | POA: Diagnosis not present

## 2016-06-22 DIAGNOSIS — Z7984 Long term (current) use of oral hypoglycemic drugs: Secondary | ICD-10-CM | POA: Diagnosis not present

## 2016-06-22 DIAGNOSIS — D649 Anemia, unspecified: Secondary | ICD-10-CM | POA: Diagnosis not present

## 2016-06-22 DIAGNOSIS — E538 Deficiency of other specified B group vitamins: Secondary | ICD-10-CM | POA: Diagnosis not present

## 2016-06-22 DIAGNOSIS — E440 Moderate protein-calorie malnutrition: Secondary | ICD-10-CM | POA: Diagnosis not present

## 2016-06-25 DIAGNOSIS — S301XXD Contusion of abdominal wall, subsequent encounter: Secondary | ICD-10-CM | POA: Diagnosis not present

## 2016-06-25 DIAGNOSIS — E1122 Type 2 diabetes mellitus with diabetic chronic kidney disease: Secondary | ICD-10-CM | POA: Diagnosis not present

## 2016-06-25 DIAGNOSIS — Z7984 Long term (current) use of oral hypoglycemic drugs: Secondary | ICD-10-CM | POA: Diagnosis not present

## 2016-06-25 DIAGNOSIS — E538 Deficiency of other specified B group vitamins: Secondary | ICD-10-CM | POA: Diagnosis not present

## 2016-06-25 DIAGNOSIS — I129 Hypertensive chronic kidney disease with stage 1 through stage 4 chronic kidney disease, or unspecified chronic kidney disease: Secondary | ICD-10-CM | POA: Diagnosis not present

## 2016-06-25 DIAGNOSIS — C9110 Chronic lymphocytic leukemia of B-cell type not having achieved remission: Secondary | ICD-10-CM | POA: Diagnosis not present

## 2016-06-25 DIAGNOSIS — N189 Chronic kidney disease, unspecified: Secondary | ICD-10-CM | POA: Diagnosis not present

## 2016-06-25 DIAGNOSIS — E440 Moderate protein-calorie malnutrition: Secondary | ICD-10-CM | POA: Diagnosis not present

## 2016-06-25 DIAGNOSIS — D649 Anemia, unspecified: Secondary | ICD-10-CM | POA: Diagnosis not present

## 2016-06-26 ENCOUNTER — Encounter (HOSPITAL_COMMUNITY): Payer: Medicare Other

## 2016-06-26 DIAGNOSIS — C9110 Chronic lymphocytic leukemia of B-cell type not having achieved remission: Secondary | ICD-10-CM

## 2016-06-26 LAB — COMPREHENSIVE METABOLIC PANEL
ALT: 21 U/L (ref 17–63)
ANION GAP: 6 (ref 5–15)
AST: 23 U/L (ref 15–41)
Albumin: 4.3 g/dL (ref 3.5–5.0)
Alkaline Phosphatase: 56 U/L (ref 38–126)
BUN: 32 mg/dL — ABNORMAL HIGH (ref 6–20)
CHLORIDE: 101 mmol/L (ref 101–111)
CO2: 28 mmol/L (ref 22–32)
Calcium: 9 mg/dL (ref 8.9–10.3)
Creatinine, Ser: 2.17 mg/dL — ABNORMAL HIGH (ref 0.61–1.24)
GFR, EST AFRICAN AMERICAN: 32 mL/min — AB (ref >60)
GFR, EST NON AFRICAN AMERICAN: 27 mL/min — AB (ref >60)
Glucose, Bld: 113 mg/dL — ABNORMAL HIGH (ref 65–99)
POTASSIUM: 5.3 mmol/L — AB (ref 3.5–5.1)
Sodium: 135 mmol/L (ref 135–145)
Total Bilirubin: 0.5 mg/dL (ref 0.3–1.2)
Total Protein: 7.1 g/dL (ref 6.5–8.1)

## 2016-06-26 LAB — CBC WITH DIFFERENTIAL/PLATELET
BASOS PCT: 0 %
Basophils Absolute: 0 10*3/uL (ref 0.0–0.1)
EOS PCT: 8 %
Eosinophils Absolute: 0.8 10*3/uL — ABNORMAL HIGH (ref 0.0–0.7)
HCT: 32.2 % — ABNORMAL LOW (ref 39.0–52.0)
Hemoglobin: 10.9 g/dL — ABNORMAL LOW (ref 13.0–17.0)
LYMPHS ABS: 6.1 10*3/uL — AB (ref 0.7–4.0)
Lymphocytes Relative: 64 %
MCH: 35.3 pg — AB (ref 26.0–34.0)
MCHC: 33.9 g/dL (ref 30.0–36.0)
MCV: 104.2 fL — AB (ref 78.0–100.0)
MONO ABS: 1.2 10*3/uL — AB (ref 0.1–1.0)
Monocytes Relative: 13 %
Neutro Abs: 1.4 10*3/uL — ABNORMAL LOW (ref 1.7–7.7)
Neutrophils Relative %: 15 %
PLATELETS: 114 10*3/uL — AB (ref 150–400)
RBC: 3.09 MIL/uL — AB (ref 4.22–5.81)
RDW: 16.3 % — AB (ref 11.5–15.5)
WBC: 9.5 10*3/uL (ref 4.0–10.5)

## 2016-06-26 LAB — URIC ACID: URIC ACID, SERUM: 2.2 mg/dL — AB (ref 4.4–7.6)

## 2016-06-26 LAB — SAMPLE TO BLOOD BANK

## 2016-06-26 LAB — LACTATE DEHYDROGENASE: LDH: 120 U/L (ref 98–192)

## 2016-06-26 LAB — PHOSPHORUS: Phosphorus: 3.9 mg/dL (ref 2.5–4.6)

## 2016-06-27 DIAGNOSIS — M109 Gout, unspecified: Secondary | ICD-10-CM | POA: Diagnosis not present

## 2016-06-27 DIAGNOSIS — C9110 Chronic lymphocytic leukemia of B-cell type not having achieved remission: Secondary | ICD-10-CM | POA: Diagnosis not present

## 2016-06-27 DIAGNOSIS — E785 Hyperlipidemia, unspecified: Secondary | ICD-10-CM | POA: Diagnosis not present

## 2016-06-27 DIAGNOSIS — D649 Anemia, unspecified: Secondary | ICD-10-CM | POA: Diagnosis not present

## 2016-06-27 DIAGNOSIS — N183 Chronic kidney disease, stage 3 (moderate): Secondary | ICD-10-CM | POA: Diagnosis not present

## 2016-06-27 DIAGNOSIS — N189 Chronic kidney disease, unspecified: Secondary | ICD-10-CM | POA: Diagnosis not present

## 2016-06-27 DIAGNOSIS — E875 Hyperkalemia: Secondary | ICD-10-CM | POA: Diagnosis not present

## 2016-06-27 DIAGNOSIS — E1129 Type 2 diabetes mellitus with other diabetic kidney complication: Secondary | ICD-10-CM | POA: Diagnosis not present

## 2016-06-27 DIAGNOSIS — R809 Proteinuria, unspecified: Secondary | ICD-10-CM | POA: Diagnosis not present

## 2016-06-27 DIAGNOSIS — R7301 Impaired fasting glucose: Secondary | ICD-10-CM | POA: Diagnosis not present

## 2016-07-02 DIAGNOSIS — E538 Deficiency of other specified B group vitamins: Secondary | ICD-10-CM | POA: Diagnosis not present

## 2016-07-02 DIAGNOSIS — S301XXD Contusion of abdominal wall, subsequent encounter: Secondary | ICD-10-CM | POA: Diagnosis not present

## 2016-07-02 DIAGNOSIS — C9110 Chronic lymphocytic leukemia of B-cell type not having achieved remission: Secondary | ICD-10-CM | POA: Diagnosis not present

## 2016-07-02 DIAGNOSIS — E1122 Type 2 diabetes mellitus with diabetic chronic kidney disease: Secondary | ICD-10-CM | POA: Diagnosis not present

## 2016-07-02 DIAGNOSIS — E440 Moderate protein-calorie malnutrition: Secondary | ICD-10-CM | POA: Diagnosis not present

## 2016-07-02 DIAGNOSIS — I129 Hypertensive chronic kidney disease with stage 1 through stage 4 chronic kidney disease, or unspecified chronic kidney disease: Secondary | ICD-10-CM | POA: Diagnosis not present

## 2016-07-02 DIAGNOSIS — D649 Anemia, unspecified: Secondary | ICD-10-CM | POA: Diagnosis not present

## 2016-07-02 DIAGNOSIS — Z7984 Long term (current) use of oral hypoglycemic drugs: Secondary | ICD-10-CM | POA: Diagnosis not present

## 2016-07-02 DIAGNOSIS — N189 Chronic kidney disease, unspecified: Secondary | ICD-10-CM | POA: Diagnosis not present

## 2016-07-03 ENCOUNTER — Other Ambulatory Visit (HOSPITAL_COMMUNITY): Payer: Self-pay | Admitting: Oncology

## 2016-07-03 ENCOUNTER — Encounter (HOSPITAL_COMMUNITY): Payer: Medicare Other

## 2016-07-03 DIAGNOSIS — C9110 Chronic lymphocytic leukemia of B-cell type not having achieved remission: Secondary | ICD-10-CM | POA: Diagnosis not present

## 2016-07-03 LAB — CBC WITH DIFFERENTIAL/PLATELET
BASOS ABS: 0 10*3/uL (ref 0.0–0.1)
Basophils Relative: 0 %
EOS ABS: 1.1 10*3/uL — AB (ref 0.0–0.7)
Eosinophils Relative: 12 %
HCT: 30.6 % — ABNORMAL LOW (ref 39.0–52.0)
HEMOGLOBIN: 10.4 g/dL — AB (ref 13.0–17.0)
LYMPHS PCT: 65 %
Lymphs Abs: 5.9 10*3/uL — ABNORMAL HIGH (ref 0.7–4.0)
MCH: 35.6 pg — ABNORMAL HIGH (ref 26.0–34.0)
MCHC: 34 g/dL (ref 30.0–36.0)
MCV: 104.8 fL — ABNORMAL HIGH (ref 78.0–100.0)
Monocytes Absolute: 1.2 10*3/uL — ABNORMAL HIGH (ref 0.1–1.0)
Monocytes Relative: 13 %
NEUTROS PCT: 10 %
Neutro Abs: 0.9 10*3/uL — ABNORMAL LOW (ref 1.7–7.7)
Platelets: 132 10*3/uL — ABNORMAL LOW (ref 150–400)
RBC: 2.92 MIL/uL — ABNORMAL LOW (ref 4.22–5.81)
RDW: 16.4 % — ABNORMAL HIGH (ref 11.5–15.5)
WBC: 9.1 10*3/uL (ref 4.0–10.5)

## 2016-07-03 LAB — COMPREHENSIVE METABOLIC PANEL
ALK PHOS: 55 U/L (ref 38–126)
ALT: 25 U/L (ref 17–63)
ANION GAP: 6 (ref 5–15)
AST: 26 U/L (ref 15–41)
Albumin: 4.1 g/dL (ref 3.5–5.0)
BUN: 23 mg/dL — ABNORMAL HIGH (ref 6–20)
CALCIUM: 8.8 mg/dL — AB (ref 8.9–10.3)
CO2: 26 mmol/L (ref 22–32)
Chloride: 105 mmol/L (ref 101–111)
Creatinine, Ser: 2.04 mg/dL — ABNORMAL HIGH (ref 0.61–1.24)
GFR calc Af Amer: 34 mL/min — ABNORMAL LOW (ref >60)
GFR calc non Af Amer: 29 mL/min — ABNORMAL LOW (ref >60)
Glucose, Bld: 111 mg/dL — ABNORMAL HIGH (ref 65–99)
Potassium: 4.4 mmol/L (ref 3.5–5.1)
SODIUM: 137 mmol/L (ref 135–145)
Total Bilirubin: 0.4 mg/dL (ref 0.3–1.2)
Total Protein: 6.9 g/dL (ref 6.5–8.1)

## 2016-07-03 LAB — LACTATE DEHYDROGENASE: LDH: 113 U/L (ref 98–192)

## 2016-07-03 LAB — URIC ACID: URIC ACID, SERUM: 2.2 mg/dL — AB (ref 4.4–7.6)

## 2016-07-03 LAB — PHOSPHORUS: PHOSPHORUS: 3.5 mg/dL (ref 2.5–4.6)

## 2016-07-03 LAB — SAMPLE TO BLOOD BANK

## 2016-07-09 DIAGNOSIS — E782 Mixed hyperlipidemia: Secondary | ICD-10-CM | POA: Diagnosis not present

## 2016-07-09 DIAGNOSIS — R7301 Impaired fasting glucose: Secondary | ICD-10-CM | POA: Diagnosis not present

## 2016-07-10 ENCOUNTER — Other Ambulatory Visit (HOSPITAL_COMMUNITY): Payer: Medicare Other

## 2016-07-10 DIAGNOSIS — C9110 Chronic lymphocytic leukemia of B-cell type not having achieved remission: Secondary | ICD-10-CM

## 2016-07-11 ENCOUNTER — Encounter (HOSPITAL_COMMUNITY): Payer: Medicare Other

## 2016-07-11 ENCOUNTER — Encounter (HOSPITAL_BASED_OUTPATIENT_CLINIC_OR_DEPARTMENT_OTHER): Payer: Medicare Other

## 2016-07-11 VITALS — BP 118/71 | HR 88 | Temp 97.6°F | Resp 18

## 2016-07-11 DIAGNOSIS — E538 Deficiency of other specified B group vitamins: Secondary | ICD-10-CM

## 2016-07-11 DIAGNOSIS — Z0000 Encounter for general adult medical examination without abnormal findings: Secondary | ICD-10-CM | POA: Diagnosis not present

## 2016-07-11 DIAGNOSIS — C9110 Chronic lymphocytic leukemia of B-cell type not having achieved remission: Secondary | ICD-10-CM

## 2016-07-11 DIAGNOSIS — N189 Chronic kidney disease, unspecified: Secondary | ICD-10-CM | POA: Diagnosis not present

## 2016-07-11 DIAGNOSIS — R7301 Impaired fasting glucose: Secondary | ICD-10-CM | POA: Diagnosis not present

## 2016-07-11 DIAGNOSIS — E785 Hyperlipidemia, unspecified: Secondary | ICD-10-CM | POA: Diagnosis not present

## 2016-07-11 DIAGNOSIS — M109 Gout, unspecified: Secondary | ICD-10-CM | POA: Diagnosis not present

## 2016-07-11 LAB — COMPREHENSIVE METABOLIC PANEL
ALK PHOS: 59 U/L (ref 38–126)
ALT: 23 U/L (ref 17–63)
AST: 26 U/L (ref 15–41)
Albumin: 4.3 g/dL (ref 3.5–5.0)
Anion gap: 5 (ref 5–15)
BUN: 19 mg/dL (ref 6–20)
CALCIUM: 8.8 mg/dL — AB (ref 8.9–10.3)
CO2: 28 mmol/L (ref 22–32)
CREATININE: 1.9 mg/dL — AB (ref 0.61–1.24)
Chloride: 105 mmol/L (ref 101–111)
GFR calc non Af Amer: 32 mL/min — ABNORMAL LOW (ref >60)
GFR, EST AFRICAN AMERICAN: 37 mL/min — AB (ref >60)
Glucose, Bld: 100 mg/dL — ABNORMAL HIGH (ref 65–99)
Potassium: 4.7 mmol/L (ref 3.5–5.1)
SODIUM: 138 mmol/L (ref 135–145)
Total Bilirubin: 0.3 mg/dL (ref 0.3–1.2)
Total Protein: 7.2 g/dL (ref 6.5–8.1)

## 2016-07-11 LAB — CBC WITH DIFFERENTIAL/PLATELET
BASOS PCT: 0 %
Basophils Absolute: 0 10*3/uL (ref 0.0–0.1)
EOS PCT: 11 %
Eosinophils Absolute: 1.2 10*3/uL — ABNORMAL HIGH (ref 0.0–0.7)
HCT: 32.5 % — ABNORMAL LOW (ref 39.0–52.0)
HEMOGLOBIN: 10.8 g/dL — AB (ref 13.0–17.0)
LYMPHS PCT: 63 %
Lymphs Abs: 6.9 10*3/uL — ABNORMAL HIGH (ref 0.7–4.0)
MCH: 35.8 pg — AB (ref 26.0–34.0)
MCHC: 33.2 g/dL (ref 30.0–36.0)
MCV: 107.6 fL — AB (ref 78.0–100.0)
Monocytes Absolute: 1.7 10*3/uL — ABNORMAL HIGH (ref 0.1–1.0)
Monocytes Relative: 15 %
Neutro Abs: 1.2 10*3/uL — ABNORMAL LOW (ref 1.7–7.7)
Neutrophils Relative %: 11 %
PLATELETS: 159 10*3/uL (ref 150–400)
RBC: 3.02 MIL/uL — AB (ref 4.22–5.81)
RDW: 17.3 % — ABNORMAL HIGH (ref 11.5–15.5)
WBC: 11 10*3/uL — AB (ref 4.0–10.5)

## 2016-07-11 LAB — URIC ACID: URIC ACID, SERUM: 1.9 mg/dL — AB (ref 4.4–7.6)

## 2016-07-11 LAB — LACTATE DEHYDROGENASE: LDH: 130 U/L (ref 98–192)

## 2016-07-11 LAB — PHOSPHORUS: PHOSPHORUS: 3 mg/dL (ref 2.5–4.6)

## 2016-07-11 LAB — SAMPLE TO BLOOD BANK

## 2016-07-11 MED ORDER — CYANOCOBALAMIN 1000 MCG/ML IJ SOLN
INTRAMUSCULAR | Status: AC
  Filled 2016-07-11: qty 1

## 2016-07-11 MED ORDER — CYANOCOBALAMIN 1000 MCG/ML IJ SOLN
1000.0000 ug | Freq: Once | INTRAMUSCULAR | Status: AC
  Administered 2016-07-11: 1000 ug via INTRAMUSCULAR

## 2016-07-11 NOTE — Patient Instructions (Addendum)
[facility] at [facility] Discharge Instructions  RECOMMENDATIONS MADE BY THE CONSULTANT AND ANY TEST RESULTS WILL BE SENT TO YOUR REFERRING PHYSICIAN.  Vitamin B12 1000 mcg injection given as ordered. Return as scheduled.  Thank you for choosing [facility] at [facility] to provide your oncology and hematology care.  To afford each patient quality time with our provider, please arrive at least 15 minutes before your scheduled appointment time.    If you have a lab appointment with the Cancer Center please come in thru the  Main Entrance and check in at the main information desk  You need to re-schedule your appointment should you arrive 10 or more minutes late.  We strive to give you quality time with our providers, and arriving late affects you and other patients whose appointments are after yours.  Also, if you no show three or more times for appointments you may be dismissed from the clinic at the providers discretion.     Again, thank you for choosing [facility].  Our hope is that these requests will decrease the amount of time that you wait before being seen by our physicians.       _____________________________________________________________  Should you have questions after your visit to  Cancer Center, please contact our office at [phone] between the hours of 8:30 a.m. and 4:30 p.m.  Voicemails left after 4:30 p.m. will not be returned until the following business day.  For prescription refill requests, have your pharmacy contact our office.       Resources For Cancer Patients and their Caregivers ? American Cancer Society: Can assist with transportation, wigs, general needs, runs Look Good Feel Better.        1-888-227-6333 ? Cancer Care: Provides financial assistance, online support groups, medication/co-pay assistance.  1-800-813-HOPE (4673) ? [facility] Assists [place] cancer patients and their families through emotional , educational and financial support.  336-427-4357 ? [place] DSS Where to apply for food stamps, Medicaid and utility assistance. 336-342-1394 ? [employer]: Transportation to medical appointments. 336-347-2287 ? Social Security Administration: May apply for disability if have a Stage IV cancer. 336-342-7796 1-800-772-1213 ? [place] Aging, Disability and Transit Services: Assists with nutrition, care and transit needs. 336-349-2343  Cancer Center Support Programs: @10RELATIVEDAYS@ > Cancer Support Group  2nd Tuesday of the month 1pm-2pm, Journey Room  > Creative Journey  3rd Tuesday of the month 1130am-1pm, Journey Room  > Look Good Feel Better  1st Wednesday of the month 10am-12 noon, Journey Room (Call American Cancer Society to register 1-800-395-5775)   

## 2016-07-11 NOTE — Progress Notes (Signed)
Randall Reyes presents today for injection per MD orders. B12 1000 mcg administered IM in left Upper Arm. Administration without incident. Patient tolerated well.

## 2016-07-12 DIAGNOSIS — S301XXD Contusion of abdominal wall, subsequent encounter: Secondary | ICD-10-CM | POA: Diagnosis not present

## 2016-07-12 DIAGNOSIS — N189 Chronic kidney disease, unspecified: Secondary | ICD-10-CM | POA: Diagnosis not present

## 2016-07-12 DIAGNOSIS — Z7984 Long term (current) use of oral hypoglycemic drugs: Secondary | ICD-10-CM | POA: Diagnosis not present

## 2016-07-12 DIAGNOSIS — E538 Deficiency of other specified B group vitamins: Secondary | ICD-10-CM | POA: Diagnosis not present

## 2016-07-12 DIAGNOSIS — E1122 Type 2 diabetes mellitus with diabetic chronic kidney disease: Secondary | ICD-10-CM | POA: Diagnosis not present

## 2016-07-12 DIAGNOSIS — D649 Anemia, unspecified: Secondary | ICD-10-CM | POA: Diagnosis not present

## 2016-07-12 DIAGNOSIS — I129 Hypertensive chronic kidney disease with stage 1 through stage 4 chronic kidney disease, or unspecified chronic kidney disease: Secondary | ICD-10-CM | POA: Diagnosis not present

## 2016-07-12 DIAGNOSIS — E440 Moderate protein-calorie malnutrition: Secondary | ICD-10-CM | POA: Diagnosis not present

## 2016-07-12 DIAGNOSIS — C9110 Chronic lymphocytic leukemia of B-cell type not having achieved remission: Secondary | ICD-10-CM | POA: Diagnosis not present

## 2016-07-17 ENCOUNTER — Encounter (HOSPITAL_COMMUNITY): Payer: Medicare Other | Attending: Oncology

## 2016-07-17 DIAGNOSIS — C9110 Chronic lymphocytic leukemia of B-cell type not having achieved remission: Secondary | ICD-10-CM | POA: Insufficient documentation

## 2016-07-17 LAB — COMPREHENSIVE METABOLIC PANEL
ALBUMIN: 4.2 g/dL (ref 3.5–5.0)
ALT: 23 U/L (ref 17–63)
AST: 27 U/L (ref 15–41)
Alkaline Phosphatase: 62 U/L (ref 38–126)
Anion gap: 6 (ref 5–15)
BUN: 28 mg/dL — AB (ref 6–20)
CHLORIDE: 105 mmol/L (ref 101–111)
CO2: 27 mmol/L (ref 22–32)
Calcium: 9 mg/dL (ref 8.9–10.3)
Creatinine, Ser: 2.23 mg/dL — ABNORMAL HIGH (ref 0.61–1.24)
GFR calc Af Amer: 31 mL/min — ABNORMAL LOW (ref >60)
GFR calc non Af Amer: 26 mL/min — ABNORMAL LOW (ref >60)
GLUCOSE: 111 mg/dL — AB (ref 65–99)
POTASSIUM: 4.8 mmol/L (ref 3.5–5.1)
Sodium: 138 mmol/L (ref 135–145)
Total Bilirubin: 0.6 mg/dL (ref 0.3–1.2)
Total Protein: 7 g/dL (ref 6.5–8.1)

## 2016-07-17 LAB — CBC WITH DIFFERENTIAL/PLATELET
BASOS ABS: 0 10*3/uL (ref 0.0–0.1)
Basophils Relative: 0 %
Eosinophils Absolute: 1.1 10*3/uL — ABNORMAL HIGH (ref 0.0–0.7)
Eosinophils Relative: 9 %
HEMATOCRIT: 32.4 % — AB (ref 39.0–52.0)
Hemoglobin: 10.9 g/dL — ABNORMAL LOW (ref 13.0–17.0)
LYMPHS ABS: 7.6 10*3/uL — AB (ref 0.7–4.0)
Lymphocytes Relative: 62 %
MCH: 36.6 pg — ABNORMAL HIGH (ref 26.0–34.0)
MCHC: 33.6 g/dL (ref 30.0–36.0)
MCV: 108.7 fL — ABNORMAL HIGH (ref 78.0–100.0)
MONO ABS: 2.1 10*3/uL — AB (ref 0.1–1.0)
MONOS PCT: 17 %
Neutro Abs: 1.5 10*3/uL — ABNORMAL LOW (ref 1.7–7.7)
Neutrophils Relative %: 12 %
PLATELETS: 154 10*3/uL (ref 150–400)
RBC: 2.98 MIL/uL — AB (ref 4.22–5.81)
RDW: 17.2 % — AB (ref 11.5–15.5)
WBC: 12.3 10*3/uL — AB (ref 4.0–10.5)

## 2016-07-17 LAB — PHOSPHORUS: Phosphorus: 3.5 mg/dL (ref 2.5–4.6)

## 2016-07-17 LAB — LACTATE DEHYDROGENASE: LDH: 128 U/L (ref 98–192)

## 2016-07-17 LAB — URIC ACID: Uric Acid, Serum: 1.9 mg/dL — ABNORMAL LOW (ref 4.4–7.6)

## 2016-07-18 LAB — SAMPLE TO BLOOD BANK

## 2016-07-19 DIAGNOSIS — E538 Deficiency of other specified B group vitamins: Secondary | ICD-10-CM | POA: Diagnosis not present

## 2016-07-19 DIAGNOSIS — S301XXD Contusion of abdominal wall, subsequent encounter: Secondary | ICD-10-CM | POA: Diagnosis not present

## 2016-07-19 DIAGNOSIS — N189 Chronic kidney disease, unspecified: Secondary | ICD-10-CM | POA: Diagnosis not present

## 2016-07-19 DIAGNOSIS — C9110 Chronic lymphocytic leukemia of B-cell type not having achieved remission: Secondary | ICD-10-CM | POA: Diagnosis not present

## 2016-07-19 DIAGNOSIS — E440 Moderate protein-calorie malnutrition: Secondary | ICD-10-CM | POA: Diagnosis not present

## 2016-07-19 DIAGNOSIS — Z7984 Long term (current) use of oral hypoglycemic drugs: Secondary | ICD-10-CM | POA: Diagnosis not present

## 2016-07-19 DIAGNOSIS — I129 Hypertensive chronic kidney disease with stage 1 through stage 4 chronic kidney disease, or unspecified chronic kidney disease: Secondary | ICD-10-CM | POA: Diagnosis not present

## 2016-07-19 DIAGNOSIS — E1122 Type 2 diabetes mellitus with diabetic chronic kidney disease: Secondary | ICD-10-CM | POA: Diagnosis not present

## 2016-07-19 DIAGNOSIS — D649 Anemia, unspecified: Secondary | ICD-10-CM | POA: Diagnosis not present

## 2016-07-23 ENCOUNTER — Other Ambulatory Visit (HOSPITAL_COMMUNITY): Payer: Self-pay | Admitting: *Deleted

## 2016-07-23 DIAGNOSIS — C9110 Chronic lymphocytic leukemia of B-cell type not having achieved remission: Secondary | ICD-10-CM

## 2016-07-24 ENCOUNTER — Encounter (HOSPITAL_COMMUNITY): Payer: Medicare Other

## 2016-07-24 ENCOUNTER — Other Ambulatory Visit (HOSPITAL_COMMUNITY): Payer: Self-pay | Admitting: *Deleted

## 2016-07-24 DIAGNOSIS — C9110 Chronic lymphocytic leukemia of B-cell type not having achieved remission: Secondary | ICD-10-CM

## 2016-07-24 LAB — CBC WITH DIFFERENTIAL/PLATELET
BASOS ABS: 0 10*3/uL (ref 0.0–0.1)
BASOS PCT: 0 %
EOS ABS: 1.2 10*3/uL — AB (ref 0.0–0.7)
Eosinophils Relative: 10 %
HCT: 31.6 % — ABNORMAL LOW (ref 39.0–52.0)
Hemoglobin: 10.4 g/dL — ABNORMAL LOW (ref 13.0–17.0)
Lymphocytes Relative: 61 %
Lymphs Abs: 7.4 10*3/uL — ABNORMAL HIGH (ref 0.7–4.0)
MCH: 36 pg — AB (ref 26.0–34.0)
MCHC: 32.9 g/dL (ref 30.0–36.0)
MCV: 109.3 fL — ABNORMAL HIGH (ref 78.0–100.0)
Monocytes Absolute: 2 10*3/uL — ABNORMAL HIGH (ref 0.1–1.0)
Monocytes Relative: 16 %
NEUTROS PCT: 13 %
Neutro Abs: 1.6 10*3/uL — ABNORMAL LOW (ref 1.7–7.7)
PLATELETS: 155 10*3/uL (ref 150–400)
RBC: 2.89 MIL/uL — ABNORMAL LOW (ref 4.22–5.81)
RDW: 17 % — ABNORMAL HIGH (ref 11.5–15.5)
WBC: 12.2 10*3/uL — ABNORMAL HIGH (ref 4.0–10.5)

## 2016-07-24 LAB — COMPREHENSIVE METABOLIC PANEL
ALBUMIN: 4.1 g/dL (ref 3.5–5.0)
ALT: 20 U/L (ref 17–63)
ANION GAP: 5 (ref 5–15)
AST: 24 U/L (ref 15–41)
Alkaline Phosphatase: 61 U/L (ref 38–126)
BUN: 25 mg/dL — ABNORMAL HIGH (ref 6–20)
CHLORIDE: 104 mmol/L (ref 101–111)
CO2: 29 mmol/L (ref 22–32)
Calcium: 8.8 mg/dL — ABNORMAL LOW (ref 8.9–10.3)
Creatinine, Ser: 2.07 mg/dL — ABNORMAL HIGH (ref 0.61–1.24)
GFR calc non Af Amer: 29 mL/min — ABNORMAL LOW (ref >60)
GFR, EST AFRICAN AMERICAN: 33 mL/min — AB (ref >60)
GLUCOSE: 107 mg/dL — AB (ref 65–99)
Potassium: 4.6 mmol/L (ref 3.5–5.1)
SODIUM: 138 mmol/L (ref 135–145)
Total Bilirubin: 0.5 mg/dL (ref 0.3–1.2)
Total Protein: 6.8 g/dL (ref 6.5–8.1)

## 2016-07-24 LAB — URIC ACID: URIC ACID, SERUM: 2 mg/dL — AB (ref 4.4–7.6)

## 2016-07-24 LAB — SAMPLE TO BLOOD BANK

## 2016-07-24 LAB — PHOSPHORUS: PHOSPHORUS: 3.6 mg/dL (ref 2.5–4.6)

## 2016-07-24 LAB — LACTATE DEHYDROGENASE: LDH: 133 U/L (ref 98–192)

## 2016-07-30 ENCOUNTER — Encounter (HOSPITAL_COMMUNITY): Payer: Medicare Other

## 2016-07-30 ENCOUNTER — Encounter (HOSPITAL_COMMUNITY): Payer: Self-pay | Admitting: Adult Health

## 2016-07-30 ENCOUNTER — Encounter (HOSPITAL_BASED_OUTPATIENT_CLINIC_OR_DEPARTMENT_OTHER): Payer: Medicare Other | Admitting: Adult Health

## 2016-07-30 VITALS — BP 118/62 | HR 61 | Temp 97.5°F | Resp 16 | Ht 61.5 in | Wt 135.0 lb

## 2016-07-30 DIAGNOSIS — C9110 Chronic lymphocytic leukemia of B-cell type not having achieved remission: Secondary | ICD-10-CM | POA: Diagnosis not present

## 2016-07-30 DIAGNOSIS — N189 Chronic kidney disease, unspecified: Secondary | ICD-10-CM | POA: Diagnosis not present

## 2016-07-30 DIAGNOSIS — D509 Iron deficiency anemia, unspecified: Secondary | ICD-10-CM

## 2016-07-30 DIAGNOSIS — D539 Nutritional anemia, unspecified: Secondary | ICD-10-CM

## 2016-07-30 LAB — COMPREHENSIVE METABOLIC PANEL
ALT: 28 U/L (ref 17–63)
AST: 29 U/L (ref 15–41)
Albumin: 4.2 g/dL (ref 3.5–5.0)
Alkaline Phosphatase: 69 U/L (ref 38–126)
Anion gap: 5 (ref 5–15)
BILIRUBIN TOTAL: 0.4 mg/dL (ref 0.3–1.2)
BUN: 22 mg/dL — AB (ref 6–20)
CHLORIDE: 101 mmol/L (ref 101–111)
CO2: 27 mmol/L (ref 22–32)
Calcium: 8.4 mg/dL — ABNORMAL LOW (ref 8.9–10.3)
Creatinine, Ser: 1.89 mg/dL — ABNORMAL HIGH (ref 0.61–1.24)
GFR, EST AFRICAN AMERICAN: 37 mL/min — AB (ref >60)
GFR, EST NON AFRICAN AMERICAN: 32 mL/min — AB (ref >60)
Glucose, Bld: 108 mg/dL — ABNORMAL HIGH (ref 65–99)
POTASSIUM: 4.6 mmol/L (ref 3.5–5.1)
Sodium: 133 mmol/L — ABNORMAL LOW (ref 135–145)
Total Protein: 6.9 g/dL (ref 6.5–8.1)

## 2016-07-30 LAB — CBC WITH DIFFERENTIAL/PLATELET
BASOS PCT: 1 %
Basophils Absolute: 0.2 10*3/uL — ABNORMAL HIGH (ref 0.0–0.1)
Eosinophils Absolute: 2.3 10*3/uL — ABNORMAL HIGH (ref 0.0–0.7)
Eosinophils Relative: 15 %
HEMATOCRIT: 31.9 % — AB (ref 39.0–52.0)
Hemoglobin: 10.6 g/dL — ABNORMAL LOW (ref 13.0–17.0)
LYMPHS ABS: 11.1 10*3/uL — AB (ref 0.7–4.0)
Lymphocytes Relative: 72 %
MCH: 36.8 pg — ABNORMAL HIGH (ref 26.0–34.0)
MCHC: 33.2 g/dL (ref 30.0–36.0)
MCV: 110.8 fL — AB (ref 78.0–100.0)
MONO ABS: 0.5 10*3/uL (ref 0.1–1.0)
Monocytes Relative: 3 %
NEUTROS PCT: 9 %
Neutro Abs: 1.4 10*3/uL — ABNORMAL LOW (ref 1.7–7.7)
PLATELETS: 164 10*3/uL (ref 150–400)
RBC: 2.88 MIL/uL — ABNORMAL LOW (ref 4.22–5.81)
RDW: 16.8 % — ABNORMAL HIGH (ref 11.5–15.5)
WBC: 15.5 10*3/uL — AB (ref 4.0–10.5)

## 2016-07-30 LAB — URIC ACID: Uric Acid, Serum: 1.8 mg/dL — ABNORMAL LOW (ref 4.4–7.6)

## 2016-07-30 LAB — PHOSPHORUS: Phosphorus: 3.1 mg/dL (ref 2.5–4.6)

## 2016-07-30 LAB — LACTATE DEHYDROGENASE: LDH: 146 U/L (ref 98–192)

## 2016-07-30 MED ORDER — HEPARIN SOD (PORK) LOCK FLUSH 100 UNIT/ML IV SOLN
INTRAVENOUS | Status: AC
  Filled 2016-07-30: qty 5

## 2016-07-30 MED ORDER — HEPARIN SOD (PORK) LOCK FLUSH 100 UNIT/ML IV SOLN: 500.0000 [IU] | Freq: Once | INTRAVENOUS | Status: AC

## 2016-07-30 MED ORDER — SODIUM CHLORIDE 0.9% FLUSH
10.0000 mL | INTRAVENOUS | Status: AC | PRN
Start: 2016-07-30 — End: ?

## 2016-07-30 NOTE — Progress Notes (Unsigned)
Randall Reyes presented for Portacath access and flush.  Proper placement of portacath confirmed by CXR.  Portacath located right chest wall accessed with  H 20 needle.  No blood return and flushes w/o difficulty.  No s/s infiltration at site. Portacath flushed with 38ml NS and 500U/75ml Heparin and needle removed intact.  Procedure tolerated well and without incident.

## 2016-07-30 NOTE — Progress Notes (Signed)
[facility] [place], [place] [address]   CLINIC:  Medical Oncology/Hematology  PCP:  Wende Neighbors, MD [place] [address] [phone]   REASON FOR VISIT:  Follow-up for CLL   CURRENT THERAPY: Observation    BRIEF ONCOLOGIC HISTORY:    CLL (chronic lymphocytic leukemia) ([place])   02/14/2005 Procedure    Peripheral flow cytometry- CLL (without 11q or 17 p deletion) presenting with stage 0 disease      10/25/2008 Progression    Increasing WBC and symptoms suggestive of progressive disease      11/09/2008 - 01/07/2009 Chemotherapy    Rituxan, fludarabine, cytoxan x 3 cycles with the discontinuation of fludarabine and cytoxan due to severe pancytopenia requiring hospitalization.      01/10/2009 Adverse Reaction    Severe pancytopenia and adverse side effects      02/01/2009 - 04/01/2009 Chemotherapy    Rituxan every 21 days x 3 cycles      04/02/2009 Remission    Stable disease.  Chemotherapy holiday started      06/22/2013 - 06/29/2013 Chemotherapy    Imbruvica daily      06/29/2013 Adverse Reaction    Large right posterior leg (popliteal) ecchymosis/violaceous rash.  Likely Imbruvica-induced      07/31/2013 - 10/12/2013 Chemotherapy    Zydelig (idelalisib) 150 mg BID monotherapy      10/12/2013 Adverse Reaction    Transaminitis, suspected to be secondary to [place].  Dose decreased.      10/12/2013 - 11/09/2013 Chemotherapy    Zydelig (idelalisib) 150 mg daily.      11/09/2013 Adverse Reaction    Worsening transaminitis.  Zydelig on HOLD      11/09/2013 Treatment Plan Change    Hold Zydelig      12/28/2013 Adverse Reaction    Herpes Zoster outbreak.  Partial response with Famvir x 1 week, switched to acyclovir.  Progression over 1-2 weeks with development of keratoconjunctivitis bilaterally.  VZ Immune Globulin 625 mg IM given on 01/13/14.      02/08/2014 Treatment Plan Change    Ibrutinib restarted 140 mg daily for one  week followed by 280 mg daily followed by 420 mg daily with prophylactic acyclovir.      02/08/2014 - 07/05/2014 Chemotherapy    Ibrutinib 420 mg daily      12020-04-1314 - 04/17/2014 Hospital Admission    Right lower lobe pneumonia      07/05/2014 Adverse Reaction    Fissuring of fingertips and sore, red feet with transaminitis      07/05/2014 Treatment Plan Change    Hold Ibrutinib      07/22/2014 - 08/19/2014 Chemotherapy    Ibrutinib restarted at 280 mg daily.      08/09/2014 - 08/12/2014 Hospital Admission    Bilateral pneumonia      08/19/2014 Adverse Reaction    Nausea with vomiting.  Escorted to ED.      08/19/2014 Treatment Plan Change    Ibrutinib on hold- ED escort from clinic       10/18/2014 Miscellaneous    B12 weekly x 4 then monthly.      12/15/2015 Imaging    Bone density- BMD as determined from Femur Neck Left is 0.803 g/cm2 with a T-score of -2.1. This patient is considered osteopenic by World Healh Organization (WHO) Criteria.      12/16/2015 Imaging    CT abd/pelvis-  Progression of retroperitoneal and portacaval adenopathy compared to the prior study.  Likely related to CLL.  Small hiatal hernia  Mild compression fractures T11 and T12, probably old. Correlate with any pain in this area.      05/22/2016 - 05/28/2016 Chemotherapy    BR day 1 and 2 x 1 cycle.  Bendamustine dose reduced by 50%.      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        HISTORY OF PRESENT ILLNESS:  (From Kirby Crigler, PA-C's last note on 06/19/16)     INTERVAL HISTORY:  Randall Reyes 81 y.o. male presents for routine follow-up for CLL, currently on observation alone, given previous intolerance to systemic therapies.   Overall, he feels well. His energy levels are  "okay"; states that he is "not too active." He is looking forward to warmer weather when he can garden and ride his tractor again.  His appetite is good; he has gained 4 lbs since early 06/2016.  Denies any easy bleeding/bruising, headaches, fever/chills, cough/shortness of breath, or N&V. His bowels are moving well; denies any blood in his stool or hematuria/dysuria.    He is largely without complaints today.      REVIEW OF SYSTEMS:  Review of Systems  Constitutional: Negative.  Negative for chills, fatigue and fever.  HENT:  Negative.  Negative for lump/mass and nosebleeds.   Eyes: Negative.   Respiratory: Negative.  Negative for cough and shortness of breath.   Cardiovascular: Negative.  Negative for chest pain and leg swelling.  Gastrointestinal: Negative.  Negative for abdominal pain, blood in stool, constipation, diarrhea, nausea and vomiting.  Endocrine: Negative.   Genitourinary: Negative.  Negative for dysuria and hematuria.   Musculoskeletal: Negative.  Negative for arthralgias.  Skin: Negative.  Negative for rash.  Neurological: Negative.  Negative for dizziness and headaches.  Hematological: Negative.  Negative for adenopathy. Does not bruise/bleed easily.  Psychiatric/Behavioral: Negative.  Negative for depression and sleep disturbance. The patient is not nervous/anxious.      PAST MEDICAL/SURGICAL HISTORY:  Past Medical History:  Diagnosis Date  . Anemia   . B12 deficiency 12/07/2014  . CLL (chronic lymphocytic leukemia) ([place]) 01/24/2011  . Diabetes mellitus   . DM (diabetes mellitus) ([place]) 01/24/2011  . Eczema 01/24/2011  . History of pneumonia 04/2014  . Hypertension   . Leukemia ([place]) 6.22.2012   PER PATIENT  . Melanoma in situ ([place]) 01/24/2011  . Mild obesity 01/24/2011  . Port catheter in place 05/02/2012  . Shingles   . Ulcer [facility])    Past Surgical History:  Procedure Laterality Date  . bleeding ulcer    . CATARACT EXTRACTION W/PHACO  05/05/2012    Procedure: CATARACT EXTRACTION PHACO AND INTRAOCULAR LENS PLACEMENT (IOC);  Surgeon: Tonny Branch, MD;  Location: [facility] ORS;  Service: Ophthalmology;  Laterality: Right;  CDE:13.25  . CATARACT EXTRACTION W/PHACO  05/15/2012   Procedure: CATARACT EXTRACTION PHACO AND INTRAOCULAR LENS PLACEMENT (IOC);  Surgeon: Tonny Branch, MD;  Location: [facility] ORS;  Service: Ophthalmology;  Laterality: Left;  CDE:  12.32  . COLONOSCOPY N/A 01/30/2016   Procedure: COLONOSCOPY;  Surgeon: Danie Binder, MD;  Location: [facility] ENDO SUITE;  Service: Endoscopy;  Laterality: N/A;  2:00 PM - moved to 1:00 - office notified pt  . ESOPHAGOGASTRODUODENOSCOPY  2008   Dr. Oneida Alar: normal esophagus, antral erythema, 1 cm clean based duodenal ulcera, negative  H. plyori  . ESOPHAGOGASTRODUODENOSCOPY N/A 01/30/2016   Procedure: ESOPHAGOGASTRODUODENOSCOPY (EGD);  Surgeon: Danie Binder, MD;  Location: [facility] ENDO SUITE;  Service: Endoscopy;  Laterality: N/A;  . HERNIA REPAIR  2001  . PORTACATH PLACEMENT  2008     SOCIAL HISTORY:  Social History   Social History  . Marital status: Married    Spouse name: N/A  . Number of children: N/A  . Years of education: N/A   Occupational History  . Not on file.   Social History Main Topics  . Smoking status: Former Research scientist (life sciences)  . Smokeless tobacco: Former Systems developer    Quit date: 04/25/1967  . Alcohol use No  . Drug use: No  . Sexual activity: Not on file   Other Topics Concern  . Not on file   Social History Narrative  . No narrative on file    FAMILY HISTORY:  Family History  Problem Relation Age of Onset  . Diabetes Father   . Colon cancer Neg Hx     CURRENT MEDICATIONS:  Outpatient Encounter Prescriptions as of 07/30/2016  Medication Sig  . ACCU-CHEK AVIVA PLUS test strip   . ACCU-CHEK SOFTCLIX LANCETS lancets   . acetaminophen (TYLENOL) 500 MG tablet Take 500 mg by mouth at bedtime as needed.   [name] acyclovir (ZOVIRAX) 400 MG tablet   . allopurinol (ZYLOPRIM) 100 MG tablet TAKE 1 TABLET BY  MOUTH THREE TIMES DAILY  . cholecalciferol (VITAMIN D) 400 units TABS tablet Take 400 Units by mouth daily.  [name] docusate sodium (COLACE) 100 MG capsule Take 100 mg by mouth daily.  . IRON PO Take 1 tablet by mouth daily.  . rosuvastatin (CRESTOR) 40 MG tablet Take 40 mg by mouth at bedtime.   . sevelamer carbonate (RENVELA) 800 MG tablet Take 2 tablets (1,600 mg total) by mouth 3 (three) times daily with meals.  . sitaGLIPtin (JANUVIA) 100 MG tablet Take 100 mg by mouth daily.  . sodium bicarbonate 650 MG tablet TAKE 1 TABLET BY MOUTH TWICE A DAY AS DIRECTED  . [DISCONTINUED] fluocinonide cream (LIDEX) 0.05 % APPLY TO AFFECTED AREA UP TO TWICE A DAY AS NEEDED NOT TO FACE, GROIN, UNDER ARMS  . [DISCONTINUED] lidocaine-prilocaine (EMLA) cream Apply a quarter size amount to affected area 1 hour prior to coming to chemotherapy.  . ondansetron (ZOFRAN) 8 MG tablet Take 1 tablet (8 mg total) by mouth every 8 (eight) hours as needed for nausea or vomiting. (Patient not taking: Reported on 07/30/2016)  . prochlorperazine (COMPAZINE) 10 MG tablet TAKE 1 TABLET (10 MG TOTAL) BY MOUTH EVERY 6 (SIX) HOURS AS NEEDED FOR NAUSEA OR VOMITING.   No facility-administered encounter medications on file as of 07/30/2016.     ALLERGIES:  Allergies  Allergen Reactions  . Aspirin Other (See Comments)    Bleeding ulcers.   . Ibuprofen Other (See Comments)    Bleeding ulcers.     PHYSICAL EXAM:  ECOG Performance status: 1 - Symptomatic, but largely independent.   Vitals:   07/30/16 1446  BP: 118/62  Pulse: 61  Resp: 16  Temp: 97.5 F (36.4 C)   Filed Weights   07/30/16 1446  Weight: 135 lb (61.2 kg)    Physical Exam  Constitutional: He is oriented to person, place, and time and well-developed, well-nourished, and in no distress.  HENT:  Head: Normocephalic.  Mouth/Throat: Oropharynx is clear and moist. No oropharyngeal exudate.  Edentulous   Eyes: Conjunctivae are normal. Pupils are equal, round,  and  reactive to light. No scleral icterus.  Neck: Normal range of motion. Neck supple.  Cardiovascular: Normal rate, regular rhythm and normal heart sounds.   Pulmonary/Chest: Effort normal. No respiratory distress. He has wheezes (Mild expiratory wheezes noted to RUL and bilateral bases; otherwise CTAB).  Abdominal: Soft. Bowel sounds are normal. There is no tenderness. There is no rebound and no guarding.  Musculoskeletal: Normal range of motion. He exhibits no edema.  Lymphadenopathy:    He has no cervical adenopathy.       Right: No supraclavicular adenopathy present.       Left: No supraclavicular adenopathy present.  Neurological: He is alert and oriented to person, place, and time. No cranial nerve deficit. Gait normal.  Skin: Skin is warm and dry. No rash noted.  Psychiatric: Mood, memory, affect and judgment normal.  Nursing note and vitals reviewed.    LABORATORY DATA:  I have reviewed the labs as listed.  CBC    Component Value Date/Time   WBC 15.5 (H) 07/30/2016 1349   RBC 2.88 (L) 07/30/2016 1349   HGB 10.6 (L) 07/30/2016 1349   HCT 31.9 (L) 07/30/2016 1349   PLT 164 07/30/2016 1349   MCV 110.8 (H) 07/30/2016 1349   MCH 36.8 (H) 07/30/2016 1349   MCHC 33.2 07/30/2016 1349   RDW 16.8 (H) 07/30/2016 1349   LYMPHSABS 11.1 (H) 07/30/2016 1349   MONOABS 0.5 07/30/2016 1349   EOSABS 2.3 (H) 07/30/2016 1349   BASOSABS 0.2 (H) 07/30/2016 1349   CMP Latest Ref Rng & Units 07/30/2016 07/24/2016 07/17/2016  Glucose 65 - 99 mg/dL 108(H) 107(H) 111(H)  BUN 6 - 20 mg/dL 22(H) 25(H) 28(H)  Creatinine 0.61 - 1.24 mg/dL 1.89(H) 2.07(H) 2.23(H)  Sodium 135 - 145 mmol/L 133(L) 138 138  Potassium 3.5 - 5.1 mmol/L 4.6 4.6 4.8  Chloride 101 - 111 mmol/L 101 104 105  CO2 22 - 32 mmol/L 27 29 27   Calcium 8.9 - 10.3 mg/dL 8.4(L) 8.8(L) 9.0  Total Protein 6.5 - 8.1 g/dL 6.9 6.8 7.0  Total Bilirubin 0.3 - 1.2 mg/dL 0.4 0.5 0.6  Alkaline Phos 38 - 126 U/L 69 61 62  AST 15 - 41 U/L 29 24  27   ALT 17 - 63 U/L 28 20 23     PENDING LABS:    DIAGNOSTIC IMAGING:    PATHOLOGY:  Peripheral blood flow cytometry: 05/28/13    Cytogenetics [facility]): 06/03/13    ASSESSMENT & PLAN:   CLL:  -Historically, patient has not tolerated systemic treatment well. He remains on surveillance alone at this time. WBCs largely stable at 15.5 today; Hemoglobin also stable at 10.6. Platelet count remains preserved within normal range at 164,000 today.  -He understands that there may come a time when he requires treatment again. For now, we will continue monitoring labs and performing H&P several times per year.  Given that his labs have largely been stable for several weeks/months, I will transition him from weekly to monthly labs.  I will have him return to cancer center in 3 months for continued follow-up visit.    NCCN guidelines for indication for treatment of CLL are:  A. Eligible for clinical trial  B. Significant disease-related symptoms   1. Fatigue (severe)   2. Night sweats   3. Weight loss   4. Fever without infection  C. Threatened end-organ function  D. Progressive bulky disease (spleen >6cm below costal margin, lymph nodes >10 cm)  E. Progressive anemia  F. Progressive thrombocytopenia.  Macrocytic anemia:  -MCV, MCH, & RDW remain elevated.  -He has been received monthly B12 injections. Last injection 07/11/16. Continue monthly B12.    Chronic kidney disease:  -Managed by Dr. Lowanda Foster; maintain appropriate follow-ups as clinically indicated.      Dispo:  -Monthly labs (CBC with diff, CMET, LDH, uric acid, phosphorus, and sample to blood bank); standing orders placed today.  -Return to cancer center in 3 months for continued follow-up.    All questions were answered to patient's stated satisfaction. Encouraged patient to call with any new concerns or questions before his next visit to the cancer center and we can certain see him sooner, if needed.    Plan of care  discussed with Dr. Talbert Cage, who agrees with the above aforementioned.    Orders placed this encounter:  Orders Placed This Encounter  Procedures  . CBC with Differential/Platelet  . Comprehensive metabolic panel  . Lactate dehydrogenase  . Phosphorus  . Sample to [place], [place] [phone]

## 2016-07-30 NOTE — Patient Instructions (Addendum)
[place] at [facility] Discharge Instructions  RECOMMENDATIONS MADE BY THE CONSULTANT AND ANY TEST RESULTS WILL BE SENT TO YOUR REFERRING PHYSICIAN.  You were seen today by Mike Craze NP. Monthly labs instead of weekly labs. Return in 3 months for follow up.    Thank you for choosing [place] at [facility] to provide your oncology and hematology care.  To afford each patient quality time with our provider, please arrive at least 15 minutes before your scheduled appointment time.    If you have a lab appointment with the [facility] please come in thru the  Main Entrance and check in at the main information desk  You need to re-schedule your appointment should you arrive 10 or more minutes late.  We strive to give you quality time with our providers, and arriving late affects you and other patients whose appointments are after yours.  Also, if you no show three or more times for appointments you may be dismissed from the clinic at the providers discretion.     Again, thank you for choosing [facility].  Our hope is that these requests will decrease the amount of time that you wait before being seen by our physicians.       _____________________________________________________________  Should you have questions after your visit to [facility], please contact our office at [phone] between the hours of 8:30 a.m. and 4:30 p.m.  Voicemails left after 4:30 p.m. will not be returned until the following business day.  For prescription refill requests, have your pharmacy contact our office.       Resources For Cancer Patients and their Caregivers ? American Cancer Society: Can assist with transportation, wigs, general needs, runs Look Good Feel Better.        610-098-1651 ? Cancer Care: Provides financial assistance, online support groups, medication/co-pay assistance.  1-800-813-HOPE 936 325 4013) ? [place] Assists [place] cancer patients and their families through emotional , educational and financial support.  586-422-5619 ? [place] DSS Where to apply for food stamps, Medicaid and utility assistance. 309-449-8026 ? [facility]: Transportation to medical appointments. (740)722-4711 ? Social Security Administration: May apply for disability if have a Stage IV cancer. 956-833-6137 (817)161-5306 ? [employer], Disability and Transit Services: Assists with nutrition, care and transit needs. [place] Support Programs: @10RELATIVEDAYS @ > Cancer Support Group  2nd Tuesday of the month 1pm-2pm, Journey Room  > Creative Journey  3rd Tuesday of the month 1130am-1pm, Journey Room  > Look Good Feel Better  1st Wednesday of the month 10am-12 noon, Journey Room (Call [place] to register [phone])

## 2016-08-02 DIAGNOSIS — N183 Chronic kidney disease, stage 3 (moderate): Secondary | ICD-10-CM | POA: Diagnosis not present

## 2016-08-02 DIAGNOSIS — E559 Vitamin D deficiency, unspecified: Secondary | ICD-10-CM | POA: Diagnosis not present

## 2016-08-02 DIAGNOSIS — Z79899 Other long term (current) drug therapy: Secondary | ICD-10-CM | POA: Diagnosis not present

## 2016-08-02 DIAGNOSIS — R809 Proteinuria, unspecified: Secondary | ICD-10-CM | POA: Diagnosis not present

## 2016-08-07 DIAGNOSIS — N183 Chronic kidney disease, stage 3 (moderate): Secondary | ICD-10-CM | POA: Diagnosis not present

## 2016-08-07 DIAGNOSIS — E875 Hyperkalemia: Secondary | ICD-10-CM | POA: Diagnosis not present

## 2016-08-07 DIAGNOSIS — N250 Renal osteodystrophy: Secondary | ICD-10-CM | POA: Diagnosis not present

## 2016-08-07 DIAGNOSIS — E1129 Type 2 diabetes mellitus with other diabetic kidney complication: Secondary | ICD-10-CM | POA: Diagnosis not present

## 2016-08-07 DIAGNOSIS — R809 Proteinuria, unspecified: Secondary | ICD-10-CM | POA: Diagnosis not present

## 2016-08-09 ENCOUNTER — Encounter (HOSPITAL_BASED_OUTPATIENT_CLINIC_OR_DEPARTMENT_OTHER): Payer: Medicare Other

## 2016-08-09 VITALS — BP 98/62 | HR 58 | Temp 98.6°F | Resp 20

## 2016-08-09 DIAGNOSIS — Z95828 Presence of other vascular implants and grafts: Secondary | ICD-10-CM

## 2016-08-09 DIAGNOSIS — E538 Deficiency of other specified B group vitamins: Secondary | ICD-10-CM | POA: Diagnosis not present

## 2016-08-09 MED ORDER — HEPARIN SOD (PORK) LOCK FLUSH 100 UNIT/ML IV SOLN
500.0000 [IU] | Freq: Once | INTRAVENOUS | Status: AC
  Administered 2016-08-09: 500 [IU] via INTRAVENOUS
  Filled 2016-08-09: qty 5

## 2016-08-09 MED ORDER — CYANOCOBALAMIN 1000 MCG/ML IJ SOLN
INTRAMUSCULAR | Status: AC
  Filled 2016-08-09: qty 1

## 2016-08-09 MED ORDER — CYANOCOBALAMIN 1000 MCG/ML IJ SOLN
1000.0000 ug | Freq: Once | INTRAMUSCULAR | Status: AC
  Administered 2016-08-09: 1000 ug via INTRAMUSCULAR

## 2016-08-09 MED ORDER — SODIUM CHLORIDE 0.9% FLUSH: 10.0000 mL | INTRAVENOUS | Status: DC | PRN

## 2016-08-09 NOTE — Progress Notes (Signed)
Randall Reyes presents today for injection per MD orders. B12 1073mcg administered IM in left Upper Arm. Administration without incident. Patient tolerated well.   Randall Reyes presented for Portacath access and flush. Proper placement of portacath confirmed by CXR. Portacath located rt chest wall accessed with  H 20 needle. No blood return. Portacath flushed with 65ml NS and 500U/34ml Heparin and needle removed intact. Procedure without incident. Patient tolerated procedure well.

## 2016-08-13 ENCOUNTER — Ambulatory Visit: Payer: Medicare Other | Admitting: Cardiovascular Disease

## 2016-08-15 ENCOUNTER — Encounter: Payer: Self-pay | Admitting: Cardiovascular Disease

## 2016-08-15 ENCOUNTER — Ambulatory Visit (INDEPENDENT_AMBULATORY_CARE_PROVIDER_SITE_OTHER): Payer: Medicare Other | Admitting: Cardiovascular Disease

## 2016-08-15 VITALS — BP 100/48 | HR 49 | Ht 61.0 in | Wt 136.0 lb

## 2016-08-15 DIAGNOSIS — R001 Bradycardia, unspecified: Secondary | ICD-10-CM

## 2016-08-15 DIAGNOSIS — I4891 Unspecified atrial fibrillation: Secondary | ICD-10-CM

## 2016-08-15 DIAGNOSIS — I351 Nonrheumatic aortic (valve) insufficiency: Secondary | ICD-10-CM

## 2016-08-15 NOTE — Patient Instructions (Signed)

## 2016-08-15 NOTE — Progress Notes (Signed)
SUBJECTIVE:The patient returns for follow-up after undergoing cardiovascular testing performed for the evaluation of atrial fibrillation.  Event monitoring demonstrated sinus rhythm with first-degree AV block. There was no evidence of atrial fibrillation. There may have been an isolated episode of atrial flutter.  Echocardiogram 06/19/16 showed normal left ventricular systolic function and regional wall motion, LVEF 86-76%, grade 1 diastolic dysfunction, mild aortic regurgitation, and normal left atrial size.  He is feeling well today and denies chest pain, shortness of breath, orthopnea, palpitations, leg swelling, lightheadedness, and dizziness.  He worked outside on a tractor for an hour and half yesterday and felt tired afterwards.   Review of Systems: As per "subjective", otherwise negative.  Allergies  Allergen Reactions  . Aspirin Other (See Comments)    Bleeding ulcers.   . Ibuprofen Other (See Comments)    Bleeding ulcers.    Current Outpatient Prescriptions  Medication Sig Dispense Refill  . ACCU-CHEK AVIVA PLUS test strip     . ACCU-CHEK SOFTCLIX LANCETS lancets     . acetaminophen (TYLENOL) 500 MG tablet Take 500 mg by mouth at bedtime as needed.     [name] acyclovir (ZOVIRAX) 400 MG tablet Take 400 mg by mouth 2 (two) times daily.     [name] allopurinol (ZYLOPRIM) 100 MG tablet TAKE 1 TABLET BY MOUTH THREE TIMES DAILY 90 tablet 1  . cholecalciferol (VITAMIN D) 400 units TABS tablet Take 400 Units by mouth daily.    [name] docusate sodium (COLACE) 100 MG capsule Take 100 mg by mouth every other day.     . IRON PO Take 1 tablet by mouth daily. Takes 2 to 3 times a week    . rosuvastatin (CRESTOR) 40 MG tablet Take 40 mg by mouth at bedtime.     . sevelamer carbonate (RENVELA) 800 MG tablet Take 2 tablets (1,600 mg total) by mouth 3 (three) times daily with meals. (Patient taking differently: Take 800 mg by mouth 3 (three) times daily with meals. ) 180 tablet 2  . sitaGLIPtin  (JANUVIA) 100 MG tablet Take 100 mg by mouth daily.    . sodium bicarbonate 650 MG tablet TAKE 1 TABLET BY MOUTH TWICE A DAY AS DIRECTED  3   No current facility-administered medications for this visit.    Facility-Administered Medications Ordered in Other Visits  Medication Dose Route Frequency Provider Last Rate Last Dose  . heparin lock flush 100 unit/mL  500 Units Intravenous Once Holley Bouche, NP      . sodium chloride flush (NS) 0.9 % injection 10 mL  10 mL Intravenous PRN Holley Bouche, NP        Past Medical History:  Diagnosis Date  . Anemia   . B12 deficiency 12/07/2014  . CLL (chronic lymphocytic leukemia) ([place]) 01/24/2011  . Diabetes mellitus   . DM (diabetes mellitus) ([place]) 01/24/2011  . Eczema 01/24/2011  . History of pneumonia 04/2014  . Hypertension   . Leukemia ([place]) 6.22.2012   PER PATIENT  . Melanoma in situ ([place]) 01/24/2011  . Mild obesity 01/24/2011  . Port catheter in place 05/02/2012  . Shingles   . Ulcer [facility])     Past Surgical History:  Procedure Laterality Date  . bleeding ulcer    . CATARACT EXTRACTION W/PHACO  05/05/2012   Procedure: CATARACT EXTRACTION PHACO AND INTRAOCULAR LENS PLACEMENT (IOC);  Surgeon: Tonny Branch, MD;  Location: [facility] ORS;  Service: Ophthalmology;  Laterality: Right;  CDE:13.25  . CATARACT EXTRACTION W/PHACO  05/15/2012   Procedure: CATARACT EXTRACTION PHACO AND INTRAOCULAR LENS PLACEMENT (IOC);  Surgeon: Tonny Branch, MD;  Location: [facility] ORS;  Service: Ophthalmology;  Laterality: Left;  CDE:  12.32  . COLONOSCOPY N/A 01/30/2016   Procedure: COLONOSCOPY;  Surgeon: Danie Binder, MD;  Location: [facility] ENDO SUITE;  Service: Endoscopy;  Laterality: N/A;  2:00 PM - moved to 1:00 - office notified pt  . ESOPHAGOGASTRODUODENOSCOPY  2008   Dr. Oneida Alar: normal esophagus, antral erythema, 1 cm clean based duodenal ulcera, negative H. plyori  . ESOPHAGOGASTRODUODENOSCOPY N/A 01/30/2016   Procedure: ESOPHAGOGASTRODUODENOSCOPY (EGD);  Surgeon: Danie Binder, MD;  Location: [facility] END[facility] SU[facility];  Service: Endoscopy;  Laterality: N/A;  . HERNIA REPAIR  2001  . PORTACATH PLACEMENT  2008    Social History   Social History  . Marital status: Married    Spouse name: N/A  . Number of children: N/A  . Years of education: N/A   Occupational History  . Not on file.   Social History Main Topics  . Smoking status: Former Research scientist (life sciences)  . Smokeless tobacco: Former Systems developer    Quit date: 04/25/1967  . Alcohol use No  . Drug use: No  . Sexual activity: Not on file   Other Topics Concern  . Not on file   Social History Narrative  . No narrative on file     Vitals:   08/15/16 0852  BP: (!) 100/48  Pulse: (!) 49  SpO2: 95%  Weight: 136 lb (61.7 kg)  Height: 5\' 1"  (1.549 m)    PHYSICAL EXAM General: NAD HEENT: Normal. Neck: No JVD, no thyromegaly. Lungs: Clear to auscultation bilaterally with normal respiratory effort. CV: Nondisplaced PMI.  Bradycardic, regular rhythm, normal S1/S2, no U3/A4, soft 1/6 systolic murmur along LSB. No pretibial or periankle edema.  No carotid bruit.   Abdomen: Soft, nontender, no distention.  Neurologic: Alert and oriented.  Psych: Normal affect. Skin: Normal. Musculoskeletal: No gross deformities.    ECG: Most recent ECG reviewed.      ASSESSMENT AND PLAN: 1. Atrial fibrillation: Event monitoring reviewed above with only one possible isolated episode of atrial flutter. He is currently asymptomatic. Previous arrhythmias occurred in the setting of tumor lysis syndrome with marked hyperkalemia, 7.4. Arrhythmias resolved after electrolyte correction. He remains asymptomatic. Left atrial size was normal as reviewed above. I will continue to monitor him for recurrences of atrial fibrillation.  2. Bradycardia: Heart rate currently 49 bpm. He is asymptomatic. He is not on AV nodal blocking agents. I will continue to monitor.  3. Aortic regurgitation: Mild. Stable.  Disposition: Follow-up in 6  months.   [name], M.D., F.A.C.C.

## 2016-08-26 ENCOUNTER — Other Ambulatory Visit: Payer: Self-pay

## 2016-08-26 ENCOUNTER — Emergency Department (HOSPITAL_COMMUNITY)
Admission: EM | Admit: 2016-08-26 | Discharge: 2016-08-26 | Disposition: A | Discharge status: Home / Self Care | Payer: Medicare Other | Attending: Emergency Medicine | Admitting: Emergency Medicine

## 2016-08-26 ENCOUNTER — Encounter (HOSPITAL_COMMUNITY): Payer: Self-pay | Admitting: Emergency Medicine

## 2016-08-26 ENCOUNTER — Emergency Department (HOSPITAL_COMMUNITY): Payer: Medicare Other

## 2016-08-26 DIAGNOSIS — Y9389 Activity, other specified: Secondary | ICD-10-CM | POA: Diagnosis not present

## 2016-08-26 DIAGNOSIS — E119 Type 2 diabetes mellitus without complications: Secondary | ICD-10-CM | POA: Insufficient documentation

## 2016-08-26 DIAGNOSIS — Y999 Unspecified external cause status: Secondary | ICD-10-CM | POA: Diagnosis not present

## 2016-08-26 DIAGNOSIS — I10 Essential (primary) hypertension: Secondary | ICD-10-CM | POA: Insufficient documentation

## 2016-08-26 DIAGNOSIS — Z856 Personal history of leukemia: Secondary | ICD-10-CM | POA: Insufficient documentation

## 2016-08-26 DIAGNOSIS — Z79899 Other long term (current) drug therapy: Secondary | ICD-10-CM | POA: Insufficient documentation

## 2016-08-26 DIAGNOSIS — W07XXXA Fall from chair, initial encounter: Secondary | ICD-10-CM | POA: Insufficient documentation

## 2016-08-26 DIAGNOSIS — L989 Disorder of the skin and subcutaneous tissue, unspecified: Secondary | ICD-10-CM | POA: Diagnosis not present

## 2016-08-26 DIAGNOSIS — S0990XA Unspecified injury of head, initial encounter: Secondary | ICD-10-CM

## 2016-08-26 DIAGNOSIS — S199XXA Unspecified injury of neck, initial encounter: Secondary | ICD-10-CM | POA: Diagnosis not present

## 2016-08-26 DIAGNOSIS — Y9222 Religious institution as the place of occurrence of the external cause: Secondary | ICD-10-CM | POA: Insufficient documentation

## 2016-08-26 DIAGNOSIS — S0003XA Contusion of scalp, initial encounter: Secondary | ICD-10-CM

## 2016-08-26 DIAGNOSIS — W19XXXA Unspecified fall, initial encounter: Secondary | ICD-10-CM

## 2016-08-26 DIAGNOSIS — Z87891 Personal history of nicotine dependence: Secondary | ICD-10-CM | POA: Diagnosis not present

## 2016-08-26 DIAGNOSIS — R21 Rash and other nonspecific skin eruption: Secondary | ICD-10-CM | POA: Insufficient documentation

## 2016-08-26 NOTE — ED Notes (Signed)
Ice given to pt. 

## 2016-08-26 NOTE — ED Notes (Addendum)
Pt made aware to return if symptoms worsen or if any life threatening symptoms occur.  Dr. Rogene Houston confirmed that pt is okay to go home at this time.  bp is 106/66, which is normal per pt.

## 2016-08-26 NOTE — ED Notes (Signed)
Pt has hematoma to right parietal area of head. Edema noted.

## 2016-08-26 NOTE — ED Triage Notes (Signed)
PT states he was bending over to pick up a chair at church this am and got dizzy and fell sideways and hit the right back side of his head. PT denies any visual changes since incident. PT denies any blood thinning medications in use.

## 2016-08-26 NOTE — Discharge Instructions (Signed)
CT of head and neck without any acute findings. Scalp hematoma will resolve over the next few days. Keep it clean with soap and water. Return for any new or worse symptoms. Follow-up with your doctor as needed.

## 2016-08-26 NOTE — ED Provider Notes (Signed)
[facility] DEPT Provider Note   CSN: [id number] Arrival date & time: 08/26/16  1005  By signing my name below, I, Jaquelyn Bitter., attest that this documentation has been prepared under the direction and in the presence of Fredia Sorrow, MD. Electronically signed: Jaquelyn Bitter., ED Scribe. 08/26/16. 2:28 PM.   History   Chief Complaint Chief Complaint  Patient presents with  . Head Injury    HPI Randall Reyes is a 81 y.o. male with hx of CLL who presents to the Emergency Department for evaluation s/p fall with onset x1 hour. Pt states that while at church trying to gather chairs, he reportedly tripped and fell hitting the R posterior sided of his head. Pt reports congestion, neck pain and headaches. He denies any modifying factors. Pt denies LOC, cough, SOB, nausea, vomiting, diarrhea. He denies hx of syncope.  The history is provided by the patient and the spouse. No language interpreter was used.    Past Medical History:  Diagnosis Date  . Anemia   . B12 deficiency 12/07/2014  . CLL (chronic lymphocytic leukemia) ([place]) 01/24/2011  . Diabetes mellitus   . DM (diabetes mellitus) ([place]) 01/24/2011  . Eczema 01/24/2011  . History of pneumonia 04/2014  . Hypertension   . Leukemia ([place]) 6.22.2012   PER PATIENT  . Melanoma in situ ([place]) 01/24/2011  . Mild obesity 01/24/2011  . Port catheter in place 05/02/2012  . Shingles   . Ulcer     Patient Active Problem List   Diagnosis Date Noted  . [place] 05/23/2016  . Goals of care, counseling/discussion 05/22/2016  . Anemia 01/20/2016  . Special screening for malignant neoplasms, colon 01/20/2016  . HTN (hypertension) 02/01/2015  . B12 deficiency 12/07/2014  . Malnutrition of moderate degree ([place]) 08/11/2014  . AKI (acute kidney injury) ([place]) 08/10/2014  . Hyperkalemia 08/10/2014  . Leukocytosis 1Aug 29, 202015  . Port catheter in place 05/02/2012  . CLL (chronic lymphocytic leukemia) ([place]) 01/24/2011    . Melanoma in situ ([place]) 01/24/2011  . DM (diabetes mellitus) ([place]) 01/24/2011  . Mild obesity 01/24/2011  . Eczema 01/24/2011    Past Surgical History:  Procedure Laterality Date  . bleeding ulcer    . CATARACT EXTRACTION W/PHACO  05/05/2012   Procedure: CATARACT EXTRACTION PHACO AND INTRAOCULAR LENS PLACEMENT (IOC);  Surgeon: Tonny Branch, MD;  Location: [facility] ORS;  Service: Ophthalmology;  Laterality: Right;  CDE:13.25  . CATARACT EXTRACTION W/PHACO  05/15/2012   Procedure: CATARACT EXTRACTION PHACO AND INTRAOCULAR LENS PLACEMENT (IOC);  Surgeon: Tonny Branch, MD;  Location: [facility] ORS;  Service: Ophthalmology;  Laterality: Left;  CDE:  12.32  . COLONOSCOPY N/A 01/30/2016   Procedure: COLONOSCOPY;  Surgeon: Danie Binder, MD;  Location: [facility] END[facility] SU[facility];  Service: Endoscopy;  Laterality: N/A;  2:00 PM - moved to 1:00 - office notified pt  . ESOPHAGOGASTRODUODENOSCOPY  2008   Dr. Oneida Alar: normal esophagus, antral erythema, 1 cm clean based duodenal ulcera, negative H. plyori  . ESOPHAGOGASTRODUODENOSCOPY N/A 01/30/2016   Procedure: ESOPHAGOGASTRODUODENOSCOPY (EGD);  Surgeon: Danie Binder, MD;  Location: [facility];  Service: Endoscopy;  Laterality: N/A;  . HERNIA REPAIR  2001  . PORTACATH PLACEMENT  2008       Home Medications    Prior to Admission medications   Medication Sig Start Date End Date Taking? Authorizing Provider  acetaminophen (TYLENOL) 500 MG tablet Take 500 mg by mouth at bedtime as needed.    Yes Historical Provider, MD  acyclovir (  ZOVIRAX) 400 MG tablet Take 400 mg by mouth 2 (two) times daily.  06/14/16  Yes Historical Provider, MD  allopurinol (ZYLOPRIM) 100 MG tablet TAKE 1 TABLET BY MOUTH THREE TIMES DAILY 07/03/16  Yes B[facility] Cancer, PA-C  cholecalciferol (VITAMIN D) 400 units TABS tablet Take 400 Units by mouth daily.   Yes Historical Provider, MD  IRON PO Take 1 tablet by mouth daily. Takes 2 to 3 times a week   Yes Historical Provider, MD  rosuvastatin (CRESTOR) 40  MG tablet Take 40 mg by mouth at bedtime.  02/21/16  Yes Historical Provider, MD  sevelamer carbonate (RENVELA) 800 MG tablet Take 2 tablets (1,600 mg total) by mouth 3 (three) times daily with meals. Patient taking differently: Take 800 mg by mouth 3 (three) times daily with meals.  05/28/16  Yes Erline Hau, MD  sitaGLIPtin (JANUVIA) 100 MG tablet Take 100 mg by mouth daily.   Yes Historical Provider, MD  sodium bicarbonate 650 MG tablet TAKE 1 TABLET BY MOUTH TWICE A DAY AS DIRECTED 04/24/16  Yes Historical Provider, MD  [place] test strip  10/14/15   Historical Provider, MD  ACCU-CHEK SOFTCLIX LANCETS lancets  11/30/15   Historical Provider, MD    Family History Family History  Problem Relation Age of Onset  . Diabetes Father   . Colon cancer Neg Hx     Social History Social History  Substance Use Topics  . Smoking status: Former Research scientist (life sciences)  . Smokeless tobacco: Former Systems developer    Quit date: 04/25/1967  . Alcohol use No     Allergies   Aspirin and Ibuprofen   Review of Systems Review of Systems  Constitutional: Negative for chills and fever.  HENT: Positive for congestion.   Eyes: Negative for visual disturbance.  Respiratory: Negative for cough and shortness of breath.   Cardiovascular: Negative for chest pain and leg swelling.  Gastrointestinal: Negative for abdominal pain, diarrhea, nausea and vomiting.  Genitourinary: Negative for dysuria and hematuria.  Musculoskeletal: Positive for neck pain. Negative for back pain.  Skin: Positive for rash and wound.  Neurological: Positive for headaches.  Hematological: Does not bruise/bleed easily.     Physical Exam Updated Vital Signs BP 116/61   Pulse (!) 54   Temp 98.4 F (36.9 C) (Oral)   Resp 16   Ht 5\' 1"  (1.549 m)   Wt 61.7 kg   SpO2 99%   BMI 25.70 kg/m   Physical Exam  Constitutional: He appears well-developed and well-nourished.  HENT:  Head: Normocephalic and atraumatic.  Eyes:  Conjunctivae and EOM are normal. Pupils are equal, round, and reactive to light.  Pupils are normal, sclera clear.  Neck: Neck supple.  Cardiovascular: Normal rate, regular rhythm and normal heart sounds.   No murmur heard. Pulmonary/Chest: Effort normal and breath sounds normal. No respiratory distress.  Abdominal: Soft. There is no tenderness.  Musculoskeletal: He exhibits no edema.       Right ankle: Normal.       Left ankle: Normal.  No edema to the ankles  Neurological: He is alert. No cranial nerve deficit or sensory deficit. He exhibits normal muscle tone. Coordination normal.  Skin: Skin is warm and dry. Lesion noted. No laceration noted.  Old skin lesion measuring 2 cm, hematoma to the top of the head measuring 7 cm. Mild bleeding noted but there is no laceration.  Psychiatric: He has a normal mood and affect.  Nursing note and vitals reviewed.  ED Treatments / Results   DIAGNOSTIC STUDIES: Oxygen Saturation is 95% on RA, inadequate by my interpretation.   COORDINATION OF CARE: 2:28 PM-Discussed next steps with pt. Pt verbalized understanding and is agreeable with the plan.    Labs (all labs ordered are listed, but only abnormal results are displayed) Labs Reviewed - No data to display  EKG  EKG Interpretation None     ED ECG REPORT   Date: 08/26/2016  Rate: 63  Rhythm: normal sinus rhythm  QRS Axis: normal  Intervals: PR prolonged  ST/T Wave abnormalities: nonspecific ST/T changes  Conduction Disutrbances:left bundle branch block and left posterior fascicular block  Narrative Interpretation:   Old EKG Reviewed: none available  Correction right bundle branch block. And left posterior fascicular block  I have personally reviewed the EKG tracing and agree with the computerized printout as noted.   Radiology Ct Head Wo Contrast  Result Date: 08/26/2016 CLINICAL DATA:  [place] after bending at church today, hit right side back of his head EXAM: CT HEAD  WITHOUT CONTRAST CT CERVICAL SPINE WITHOUT CONTRAST TECHNIQUE: Multidetector CT imaging of the head and cervical spine was performed following the standard protocol without intravenous contrast. Multiplanar CT image reconstructions of the cervical spine were also generated. COMPARISON:  11/03/2010 FINDINGS: CT HEAD FINDINGS Brain: No intracranial hemorrhage, mass effect or midline shift. No acute cortical infarction. No mass lesion is noted on this unenhanced scan. Minimal cerebral atrophy. Vascular: Mild atherosclerotic calcifications of carotid siphon. Skull: No evidence of skull fracture. Sinuses/Orbits: No acute findings Other: There is scalp swelling and scalp hematoma in right posterior parietal region high convexity. The hematoma best visualized in axial image 30 measures 2.2 cm length by 1 cm thickness. CT CERVICAL SPINE FINDINGS Alignment: C1-C2 relationship is unremarkable. There is mild about 2.5 mm anterolisthesis C4 on C5 vertebral body otherwise alignment is preserved. Skull base and vertebrae: No acute fracture or subluxation. Minimal degenerative changes C1-C2 articulation. Partial bony fusion with mild anterior and mild posterior spurring C3-C4 vertebral body. Moderate anterior spurring at C6-C7 level. Mild anterior spurring at C5-C6 level. Soft tissues and spinal canal: No prevertebral soft tissue swelling. No spinal canal hematoma. Disc levels: There is disc space flattening with partial bony fusion C3-C4 vertebral body. Minimal disc space flattening at C4-C5 level. Mild disc space flattening at C5-C6 and C6-C7 level. Upper chest: There is no pneumothorax in visualized lung apices. Other: None IMPRESSION: 1. No acute intracranial abnormality. Minimal cerebral atrophy. No skull fracture is noted there is scalp swelling and scalp hematoma in right posterior parietal region high convexity please see axial image 30. The scalp hematoma measures 2.2 cm length by 1 cm thickness. 2. No cervical spine  acute fracture. There is about 2.5 mm anterolisthesis C4 on C5 vertebral body. Disc space flattening with partial bony fusion C3-C4 vertebral body. Mild disc space flattening at C5-C6 and C6-C7 level. Electronically Signed   By: Lahoma Crocker M.D.   On: 08/26/2016 14:01   Ct Cervical Spine Wo Contrast  Result Date: 08/26/2016 CLINICAL DATA:  [place] after bending at church today, hit right side back of his head EXAM: CT HEAD WITHOUT CONTRAST CT CERVICAL SPINE WITHOUT CONTRAST TECHNIQUE: Multidetector CT imaging of the head and cervical spine was performed following the standard protocol without intravenous contrast. Multiplanar CT image reconstructions of the cervical spine were also generated. COMPARISON:  11/03/2010 FINDINGS: CT HEAD FINDINGS Brain: No intracranial hemorrhage, mass effect or midline shift. No acute cortical infarction. No mass  lesion is noted on this unenhanced scan. Minimal cerebral atrophy. Vascular: Mild atherosclerotic calcifications of carotid siphon. Skull: No evidence of skull fracture. Sinuses/Orbits: No acute findings Other: There is scalp swelling and scalp hematoma in right posterior parietal region high convexity. The hematoma best visualized in axial image 30 measures 2.2 cm length by 1 cm thickness. CT CERVICAL SPINE FINDINGS Alignment: C1-C2 relationship is unremarkable. There is mild about 2.5 mm anterolisthesis C4 on C5 vertebral body otherwise alignment is preserved. Skull base and vertebrae: No acute fracture or subluxation. Minimal degenerative changes C1-C2 articulation. Partial bony fusion with mild anterior and mild posterior spurring C3-C4 vertebral body. Moderate anterior spurring at C6-C7 level. Mild anterior spurring at C5-C6 level. Soft tissues and spinal canal: No prevertebral soft tissue swelling. No spinal canal hematoma. Disc levels: There is disc space flattening with partial bony fusion C3-C4 vertebral body. Minimal disc space flattening at C4-C5 level. Mild disc  space flattening at C5-C6 and C6-C7 level. Upper chest: There is no pneumothorax in visualized lung apices. Other: None IMPRESSION: 1. No acute intracranial abnormality. Minimal cerebral atrophy. No skull fracture is noted there is scalp swelling and scalp hematoma in right posterior parietal region high convexity please see axial image 30. The scalp hematoma measures 2.2 cm length by 1 cm thickness. 2. No cervical spine acute fracture. There is about 2.5 mm anterolisthesis C4 on C5 vertebral body. Disc space flattening with partial bony fusion C3-C4 vertebral body. Mild disc space flattening at C5-C6 and C6-C7 level. Electronically Signed   By: Lahoma Crocker M.D.   On: 08/26/2016 14:01    Procedures Procedures (including critical care time)  Medications Ordered in ED Medications - No data to display   Initial Impression / Assessment and Plan / ED Course  I have reviewed the triage vital signs and the nursing notes.  Pertinent labs & imaging results that were available during my care of the patient were reviewed by me and considered in my medical decision making (see chart for details).     CT of head neck without any acute findings. Scalp hematoma as noted. Patient did not have a syncopal episode. Patient neurologically stable. Patient stable for discharge home follow-up with his doctor as needed. Patient's tetanus is up-to-date.    Final Clinical Impressions(s) / ED Diagnoses   Final diagnoses:  Injury of head, initial encounter  Hematoma of scalp, initial encounter  Fall, initial encounter    New Prescriptions New Prescriptions   No medications on file   I personally performed the services described in this documentation, which was scribed in my presence. The recorded information has been reviewed and is accurate.       Fredia Sorrow, MD 08/26/16 1428

## 2016-08-27 ENCOUNTER — Other Ambulatory Visit (HOSPITAL_COMMUNITY): Payer: Self-pay | Admitting: Oncology

## 2016-08-27 DIAGNOSIS — C9110 Chronic lymphocytic leukemia of B-cell type not having achieved remission: Secondary | ICD-10-CM

## 2016-08-30 ENCOUNTER — Encounter (HOSPITAL_COMMUNITY): Payer: Medicare Other | Attending: Oncology

## 2016-08-30 DIAGNOSIS — C9190 Lymphoid leukemia, unspecified not having achieved remission: Secondary | ICD-10-CM | POA: Diagnosis not present

## 2016-08-30 DIAGNOSIS — C9110 Chronic lymphocytic leukemia of B-cell type not having achieved remission: Secondary | ICD-10-CM

## 2016-08-30 LAB — COMPREHENSIVE METABOLIC PANEL
ALK PHOS: 72 U/L (ref 38–126)
ALT: 18 U/L (ref 17–63)
AST: 24 U/L (ref 15–41)
Albumin: 4.1 g/dL (ref 3.5–5.0)
Anion gap: 6 (ref 5–15)
BILIRUBIN TOTAL: 0.3 mg/dL (ref 0.3–1.2)
BUN: 30 mg/dL — AB (ref 6–20)
CALCIUM: 9 mg/dL (ref 8.9–10.3)
CO2: 30 mmol/L (ref 22–32)
CREATININE: 2.24 mg/dL — AB (ref 0.61–1.24)
Chloride: 101 mmol/L (ref 101–111)
GFR calc Af Amer: 30 mL/min — ABNORMAL LOW (ref >60)
GFR calc non Af Amer: 26 mL/min — ABNORMAL LOW (ref >60)
Glucose, Bld: 160 mg/dL — ABNORMAL HIGH (ref 65–99)
POTASSIUM: 5 mmol/L (ref 3.5–5.1)
Sodium: 137 mmol/L (ref 135–145)
TOTAL PROTEIN: 7.3 g/dL (ref 6.5–8.1)

## 2016-08-30 LAB — CBC WITH DIFFERENTIAL/PLATELET
BASOS ABS: 0.2 10*3/uL — AB (ref 0.0–0.1)
BASOS PCT: 1 %
Band Neutrophils: 0 %
Blasts: 0 %
EOS PCT: 4 %
Eosinophils Absolute: 0.9 10*3/uL — ABNORMAL HIGH (ref 0.0–0.7)
HCT: 32.4 % — ABNORMAL LOW (ref 39.0–52.0)
HEMOGLOBIN: 10.8 g/dL — AB (ref 13.0–17.0)
LYMPHS ABS: 19.1 10*3/uL — AB (ref 0.7–4.0)
Lymphocytes Relative: 83 %
MCH: 37.9 pg — ABNORMAL HIGH (ref 26.0–34.0)
MCHC: 33.3 g/dL (ref 30.0–36.0)
MCV: 113.7 fL — ABNORMAL HIGH (ref 78.0–100.0)
METAMYELOCYTES PCT: 0 %
MONO ABS: 0.5 10*3/uL (ref 0.1–1.0)
MYELOCYTES: 0 %
Monocytes Relative: 2 %
NEUTROS PCT: 10 %
NRBC: 0 /100{WBCs}
Neutro Abs: 2.3 10*3/uL (ref 1.7–7.7)
Other: 0 %
PROMYELOCYTES ABS: 0 %
Platelets: 171 10*3/uL (ref 150–400)
RBC: 2.85 MIL/uL — ABNORMAL LOW (ref 4.22–5.81)
RDW: 14.7 % (ref 11.5–15.5)
WBC: 23 10*3/uL — AB (ref 4.0–10.5)

## 2016-08-30 LAB — LACTATE DEHYDROGENASE: LDH: 180 U/L (ref 98–192)

## 2016-08-30 LAB — PHOSPHORUS: Phosphorus: 3.6 mg/dL (ref 2.5–4.6)

## 2016-09-07 ENCOUNTER — Encounter (HOSPITAL_BASED_OUTPATIENT_CLINIC_OR_DEPARTMENT_OTHER): Payer: Medicare Other

## 2016-09-07 VITALS — BP 101/49 | HR 68 | Temp 99.1°F | Resp 16

## 2016-09-07 DIAGNOSIS — E538 Deficiency of other specified B group vitamins: Secondary | ICD-10-CM | POA: Diagnosis not present

## 2016-09-07 MED ORDER — CYANOCOBALAMIN 1000 MCG/ML IJ SOLN
INTRAMUSCULAR | Status: AC
  Filled 2016-09-07: qty 1

## 2016-09-07 MED ORDER — CYANOCOBALAMIN 1000 MCG/ML IJ SOLN
1000.0000 ug | Freq: Once | INTRAMUSCULAR | Status: AC
  Administered 2016-09-07: 1000 ug via INTRAMUSCULAR

## 2016-09-07 NOTE — Progress Notes (Signed)
Pt here today for B12 injection. Pt given injection in left deltoid. Pt tolerated well. Pt stable and discharged home ambulatory.  

## 2016-09-15 ENCOUNTER — Other Ambulatory Visit (HOSPITAL_COMMUNITY): Payer: Self-pay | Admitting: Hematology & Oncology

## 2016-09-15 DIAGNOSIS — C9110 Chronic lymphocytic leukemia of B-cell type not having achieved remission: Secondary | ICD-10-CM

## 2016-09-21 ENCOUNTER — Other Ambulatory Visit (HOSPITAL_COMMUNITY): Payer: Self-pay | Admitting: Hematology & Oncology

## 2016-09-21 DIAGNOSIS — C9110 Chronic lymphocytic leukemia of B-cell type not having achieved remission: Secondary | ICD-10-CM

## 2016-09-28 ENCOUNTER — Encounter (HOSPITAL_COMMUNITY): Payer: Medicare Other | Attending: Oncology

## 2016-09-28 DIAGNOSIS — C9190 Lymphoid leukemia, unspecified not having achieved remission: Secondary | ICD-10-CM | POA: Diagnosis not present

## 2016-09-28 DIAGNOSIS — C9110 Chronic lymphocytic leukemia of B-cell type not having achieved remission: Secondary | ICD-10-CM

## 2016-09-28 LAB — CBC WITH DIFFERENTIAL/PLATELET
BLASTS: 0 %
Band Neutrophils: 0 %
Basophils Absolute: 0 10*3/uL (ref 0.0–0.1)
Basophils Relative: 0 %
EOS PCT: 12 %
Eosinophils Absolute: 4.7 10*3/uL — ABNORMAL HIGH (ref 0.0–0.7)
HEMATOCRIT: 34.5 % — AB (ref 39.0–52.0)
Hemoglobin: 11.5 g/dL — ABNORMAL LOW (ref 13.0–17.0)
Lymphocytes Relative: 86 %
Lymphs Abs: 33.5 10*3/uL — ABNORMAL HIGH (ref 0.7–4.0)
MCH: 38.1 pg — AB (ref 26.0–34.0)
MCHC: 33.3 g/dL (ref 30.0–36.0)
MCV: 114.2 fL — AB (ref 78.0–100.0)
Metamyelocytes Relative: 0 %
Monocytes Absolute: 0 10*3/uL — ABNORMAL LOW (ref 0.1–1.0)
Monocytes Relative: 0 %
Myelocytes: 0 %
NEUTROS PCT: 2 %
Neutro Abs: 0.8 10*3/uL — ABNORMAL LOW (ref 1.7–7.7)
Other: 0 %
PLATELETS: 142 10*3/uL — AB (ref 150–400)
PROMYELOCYTES ABS: 0 %
RBC: 3.02 MIL/uL — AB (ref 4.22–5.81)
RDW: 13.8 % (ref 11.5–15.5)
WBC: 39 10*3/uL — AB (ref 4.0–10.5)
nRBC: 0 /100 WBC

## 2016-09-28 LAB — COMPREHENSIVE METABOLIC PANEL
ALK PHOS: 78 U/L (ref 38–126)
ALT: 17 U/L (ref 17–63)
AST: 24 U/L (ref 15–41)
Albumin: 4 g/dL (ref 3.5–5.0)
Anion gap: 6 (ref 5–15)
BILIRUBIN TOTAL: 0.4 mg/dL (ref 0.3–1.2)
BUN: 23 mg/dL — ABNORMAL HIGH (ref 6–20)
CALCIUM: 8.8 mg/dL — AB (ref 8.9–10.3)
CO2: 26 mmol/L (ref 22–32)
CREATININE: 2.12 mg/dL — AB (ref 0.61–1.24)
Chloride: 106 mmol/L (ref 101–111)
GFR calc non Af Amer: 28 mL/min — ABNORMAL LOW (ref >60)
GFR, EST AFRICAN AMERICAN: 32 mL/min — AB (ref >60)
GLUCOSE: 119 mg/dL — AB (ref 65–99)
Potassium: 4.9 mmol/L (ref 3.5–5.1)
SODIUM: 138 mmol/L (ref 135–145)
Total Protein: 6.9 g/dL (ref 6.5–8.1)

## 2016-09-28 LAB — LACTATE DEHYDROGENASE: LDH: 195 U/L — ABNORMAL HIGH (ref 98–192)

## 2016-09-28 LAB — SAMPLE TO BLOOD BANK

## 2016-09-28 LAB — PHOSPHORUS: PHOSPHORUS: 3.7 mg/dL (ref 2.5–4.6)

## 2016-10-01 DIAGNOSIS — I10 Essential (primary) hypertension: Secondary | ICD-10-CM | POA: Diagnosis not present

## 2016-10-01 DIAGNOSIS — N183 Chronic kidney disease, stage 3 (moderate): Secondary | ICD-10-CM | POA: Diagnosis not present

## 2016-10-01 DIAGNOSIS — D509 Iron deficiency anemia, unspecified: Secondary | ICD-10-CM | POA: Diagnosis not present

## 2016-10-01 DIAGNOSIS — R809 Proteinuria, unspecified: Secondary | ICD-10-CM | POA: Diagnosis not present

## 2016-10-01 DIAGNOSIS — Z79899 Other long term (current) drug therapy: Secondary | ICD-10-CM | POA: Diagnosis not present

## 2016-10-03 DIAGNOSIS — C9190 Lymphoid leukemia, unspecified not having achieved remission: Secondary | ICD-10-CM | POA: Diagnosis not present

## 2016-10-03 DIAGNOSIS — R809 Proteinuria, unspecified: Secondary | ICD-10-CM | POA: Diagnosis not present

## 2016-10-03 DIAGNOSIS — N183 Chronic kidney disease, stage 3 (moderate): Secondary | ICD-10-CM | POA: Diagnosis not present

## 2016-10-03 DIAGNOSIS — E875 Hyperkalemia: Secondary | ICD-10-CM | POA: Diagnosis not present

## 2016-10-03 DIAGNOSIS — D649 Anemia, unspecified: Secondary | ICD-10-CM | POA: Diagnosis not present

## 2016-10-05 ENCOUNTER — Encounter (HOSPITAL_BASED_OUTPATIENT_CLINIC_OR_DEPARTMENT_OTHER): Payer: Medicare Other

## 2016-10-05 VITALS — BP 118/61 | HR 63 | Temp 98.0°F | Resp 18

## 2016-10-05 DIAGNOSIS — Z95828 Presence of other vascular implants and grafts: Secondary | ICD-10-CM

## 2016-10-05 DIAGNOSIS — E538 Deficiency of other specified B group vitamins: Secondary | ICD-10-CM

## 2016-10-05 MED ORDER — CYANOCOBALAMIN 1000 MCG/ML IJ SOLN
INTRAMUSCULAR | Status: AC
  Filled 2016-10-05: qty 1

## 2016-10-05 MED ORDER — CYANOCOBALAMIN 1000 MCG/ML IJ SOLN
1000.0000 ug | Freq: Once | INTRAMUSCULAR | Status: AC
  Administered 2016-10-05: 1000 ug via INTRAMUSCULAR

## 2016-10-05 MED ORDER — SODIUM CHLORIDE 0.9% FLUSH
10.0000 mL | INTRAVENOUS | Status: DC | PRN
  Administered 2016-10-05: 10 mL via INTRAVENOUS
  Filled 2016-10-05: qty 10

## 2016-10-05 MED ORDER — HEPARIN SOD (PORK) LOCK FLUSH 100 UNIT/ML IV SOLN
500.0000 [IU] | Freq: Once | INTRAVENOUS | Status: AC
  Administered 2016-10-05: 500 [IU] via INTRAVENOUS

## 2016-10-05 NOTE — Progress Notes (Signed)
Randall Reyes tolerated Vit B12 injection and Port flush well without complaints or incident. Port accessed without blood return but flushed easily with 10 ml NS and 5 ml Heparin per protocol then de-accessed. Pt denied any discomfort when flushing port. VSS Pt discharged self ambulatory in satisfactory condition accompanied by his wife

## 2016-10-05 NOTE — Patient Instructions (Signed)
[place] at [facility] Discharge Instructions  RECOMMENDATIONS MADE BY THE CONSULTANT AND ANY TEST RESULTS WILL BE SENT TO YOUR REFERRING PHYSICIAN.  Received Vit B12 injection and port flush today. Follow-up as scheduled. Call for any questions or concerns  Thank you for choosing [place] at [facility] to provide your oncology and hematology care.  To afford each patient quality time with our provider, please arrive at least 15 minutes before your scheduled appointment time.    If you have a lab appointment with the [facility] please come in thru the  Main Entrance and check in at the main information desk  You need to re-schedule your appointment should you arrive 10 or more minutes late.  We strive to give you quality time with our providers, and arriving late affects you and other patients whose appointments are after yours.  Also, if you no show three or more times for appointments you may be dismissed from the clinic at the providers discretion.     Again, thank you for choosing [facility].  Our hope is that these requests will decrease the amount of time that you wait before being seen by our physicians.       _____________________________________________________________  Should you have questions after your visit to [facility], please contact our office at [phone] between the hours of 8:30 a.m. and 4:30 p.m.  Voicemails left after 4:30 p.m. will not be returned until the following business day.  For prescription refill requests, have your pharmacy contact our office.       Resources For Cancer Patients and their Caregivers ? American Cancer Society: Can assist with transportation, wigs, general needs, runs Look Good Feel Better.        431-882-3310 ? Cancer Care: Provides financial assistance, online support groups, medication/co-pay assistance.  1-800-813-HOPE 640-508-0417) ? [place] Assists [place] cancer patients and their families through emotional , educational and financial support.  (402)432-7572 ? [place] DSS Where to apply for food stamps, Medicaid and utility assistance. 912 209 4531 ? RCATS: Transportation to medical appointments. 574-455-6772 ? Social Security Administration: May apply for disability if have a Stage IV cancer. 819-089-4411 506-158-5822 ? [employer], Disability and Transit Services: Assists with nutrition, care and transit needs. [place] Support Programs: @10RELATIVEDAYS @ > Cancer Support Group  2nd Tuesday of the month 1pm-2pm, Journey Room  > Creative Journey  3rd Tuesday of the month 1130am-1pm, Journey Room  > Look Good Feel Better  1st Wednesday of the month 10am-12 noon, Journey Room (Call [place] to register [phone])

## 2016-10-31 ENCOUNTER — Encounter (HOSPITAL_COMMUNITY): Payer: Medicare Other

## 2016-10-31 ENCOUNTER — Encounter (HOSPITAL_COMMUNITY): Payer: Self-pay

## 2016-10-31 ENCOUNTER — Encounter (HOSPITAL_COMMUNITY): Payer: Medicare Other | Attending: Oncology | Admitting: Oncology

## 2016-10-31 VITALS — BP 135/65 | Temp 98.4°F | Resp 18 | Ht 62.0 in | Wt 137.4 lb

## 2016-10-31 DIAGNOSIS — C9190 Lymphoid leukemia, unspecified not having achieved remission: Secondary | ICD-10-CM | POA: Insufficient documentation

## 2016-10-31 DIAGNOSIS — C9110 Chronic lymphocytic leukemia of B-cell type not having achieved remission: Secondary | ICD-10-CM

## 2016-10-31 DIAGNOSIS — D539 Nutritional anemia, unspecified: Secondary | ICD-10-CM

## 2016-10-31 DIAGNOSIS — N189 Chronic kidney disease, unspecified: Secondary | ICD-10-CM | POA: Diagnosis not present

## 2016-10-31 LAB — CBC WITH DIFFERENTIAL/PLATELET
BAND NEUTROPHILS: 0 %
BASOS ABS: 0 10*3/uL (ref 0.0–0.1)
BASOS PCT: 0 %
Blasts: 0 %
EOS ABS: 2.7 10*3/uL — AB (ref 0.0–0.7)
EOS PCT: 5 %
HCT: 32 % — ABNORMAL LOW (ref 39.0–52.0)
Hemoglobin: 10.6 g/dL — ABNORMAL LOW (ref 13.0–17.0)
LYMPHS ABS: 47.1 10*3/uL — AB (ref 0.7–4.0)
Lymphocytes Relative: 88 %
MCH: 37.5 pg — AB (ref 26.0–34.0)
MCHC: 33.1 g/dL (ref 30.0–36.0)
MCV: 113.1 fL — ABNORMAL HIGH (ref 78.0–100.0)
METAMYELOCYTES PCT: 0 %
MONO ABS: 0.5 10*3/uL (ref 0.1–1.0)
MYELOCYTES: 0 %
Monocytes Relative: 1 %
NRBC: 0 /100{WBCs}
Neutro Abs: 3.2 10*3/uL (ref 1.7–7.7)
Neutrophils Relative %: 6 %
Other: 0 %
PLATELETS: 136 10*3/uL — AB (ref 150–400)
Promyelocytes Absolute: 0 %
RBC: 2.83 MIL/uL — ABNORMAL LOW (ref 4.22–5.81)
RDW: 13.7 % (ref 11.5–15.5)
WBC: 53.5 10*3/uL (ref 4.0–10.5)

## 2016-10-31 LAB — SAMPLE TO BLOOD BANK

## 2016-10-31 LAB — COMPREHENSIVE METABOLIC PANEL
ALT: 13 U/L — ABNORMAL LOW (ref 17–63)
ANION GAP: 7 (ref 5–15)
AST: 18 U/L (ref 15–41)
Albumin: 3.8 g/dL (ref 3.5–5.0)
Alkaline Phosphatase: 77 U/L (ref 38–126)
BUN: 25 mg/dL — ABNORMAL HIGH (ref 6–20)
CHLORIDE: 106 mmol/L (ref 101–111)
CO2: 24 mmol/L (ref 22–32)
Calcium: 8.5 mg/dL — ABNORMAL LOW (ref 8.9–10.3)
Creatinine, Ser: 2.02 mg/dL — ABNORMAL HIGH (ref 0.61–1.24)
GFR calc Af Amer: 34 mL/min — ABNORMAL LOW (ref >60)
GFR, EST NON AFRICAN AMERICAN: 30 mL/min — AB (ref >60)
Glucose, Bld: 97 mg/dL (ref 65–99)
Potassium: 4.5 mmol/L (ref 3.5–5.1)
SODIUM: 137 mmol/L (ref 135–145)
Total Bilirubin: 0.4 mg/dL (ref 0.3–1.2)
Total Protein: 6.6 g/dL (ref 6.5–8.1)

## 2016-10-31 LAB — LACTATE DEHYDROGENASE: LDH: 200 U/L — AB (ref 98–192)

## 2016-10-31 LAB — PHOSPHORUS: PHOSPHORUS: 3.4 mg/dL (ref 2.5–4.6)

## 2016-10-31 NOTE — Progress Notes (Signed)
CRITICAL VALUE ALERT Critical value received:  WBC-53.5 Date of notification:  10/31/16 Time of notification: 3343 Critical value read back:  Yes.   Nurse who received alert:  M.Mohamedamin Nifong, LPN  MD notified (1st page):  L.Talbert Cage, MD at [phone]

## 2016-10-31 NOTE — Progress Notes (Signed)
[name], [place] [address]   CLINIC:  Medical Oncology/Hematology  PCP:  [name], [place] [address] [phone]   REASON FOR VISIT:  Follow-up for CLL   CURRENT THERAPY: Observation    BRIEF ONCOLOGIC HISTORY:    CLL (chronic lymphocytic leukemia) ([place])   02/14/2005 Procedure    Peripheral flow cytometry- CLL (without 11q or 17 p deletion) presenting with stage 0 disease      10/25/2008 Progression    Increasing WBC and symptoms suggestive of progressive disease      11/09/2008 - 01/07/2009 Chemotherapy    Rituxan, fludarabine, cytoxan x 3 cycles with the discontinuation of fludarabine and cytoxan due to severe pancytopenia requiring hospitalization.      01/10/2009 Adverse Reaction    Severe pancytopenia and adverse side effects      02/01/2009 - 04/01/2009 Chemotherapy    Rituxan every 21 days x 3 cycles      04/02/2009 Remission    Stable disease.  Chemotherapy holiday started      06/22/2013 - 06/29/2013 Chemotherapy    Imbruvica daily      06/29/2013 Adverse Reaction    Large right posterior leg (popliteal) ecchymosis/violaceous rash.  Likely Imbruvica-induced      07/31/2013 - 10/12/2013 Chemotherapy    Zydelig (idelalisib) 150 mg BID monotherapy      10/12/2013 Adverse Reaction    Transaminitis, suspected to be secondary to [place].  Dose decreased.      10/12/2013 - 11/09/2013 Chemotherapy    Zydelig (idelalisib) 150 mg daily.      11/09/2013 Adverse Reaction    Worsening transaminitis.  Zydelig on HOLD      11/09/2013 Treatment Plan Change    Hold Zydelig      12/28/2013 Adverse Reaction    Herpes Zoster outbreak.  Partial response with Famvir x 1 week, switched to acyclovir.  Progression over 1-2 weeks with development of keratoconjunctivitis bilaterally.  VZ Immune Globulin 625 mg IM given on 01/13/14.      02/08/2014 Treatment Plan Change    Ibrutinib restarted 140 mg daily for  one week followed by 280 mg daily followed by 420 mg daily with prophylactic acyclovir.      02/08/2014 - 07/05/2014 Chemotherapy    Ibrutinib 420 mg daily      [id number] - 04/17/2014 Hospital Admission    Right lower lobe pneumonia      07/05/2014 Adverse Reaction    Fissuring of fingertips and sore, red feet with transaminitis      07/05/2014 Treatment Plan Change    Hold Ibrutinib      07/22/2014 - 08/19/2014 Chemotherapy    Ibrutinib restarted at 280 mg daily.      08/09/2014 - 08/12/2014 Hospital Admission    Bilateral pneumonia      08/19/2014 Adverse Reaction    Nausea with vomiting.  Escorted to ED.      08/19/2014 Treatment Plan Change    Ibrutinib on hold- ED escort from clinic       10/18/2014 Miscellaneous    B12 weekly x 4 then monthly.      12/15/2015 Imaging    Bone density- BMD as determined from Femur Neck Left is 0.803 g/cm2 with a T-score of -2.1. This patient is considered osteopenic by World Healh Organization (WHO) Criteria.      12/16/2015 Imaging    CT abd/pelvis-  Progression of retroperitoneal and portacaval adenopathy compared to the prior  study. Likely related to CLL.  Small hiatal hernia  Mild compression fractures T11 and T12, probably old. Correlate with any pain in this area.      05/22/2016 - 05/28/2016 Chemotherapy    BR day 1 and 2 x 1 cycle.  Bendamustine dose reduced by 50%.      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        HISTORY OF PRESENT ILLNESS:  (From Randall Crigler, PA-C's last note on 06/19/16)     INTERVAL HISTORY:  [name] 81 y.o. male presents with his wife for routine follow-up for CLL, currently on observation alone, given previous intolerance to systemic therapies.  Patient states he is doing well. He  denies any B symptoms including night sweats, weight loss, severe fatigue, and fevers/chills. He denies any decline in his activity level or energy level. Denies any blurry vision, headaches. Rest of ROS was negative.    REVIEW OF SYSTEMS:  Review of Systems  Constitutional: Negative.  Negative for chills, fatigue and fever.  HENT:  Negative.  Negative for lump/mass and nosebleeds.   Eyes: Negative.   Respiratory: Negative.  Negative for cough and shortness of breath.   Cardiovascular: Negative.  Negative for chest pain and leg swelling.  Gastrointestinal: Negative.  Negative for abdominal pain, blood in stool, constipation, diarrhea, nausea and vomiting.  Endocrine: Negative.   Genitourinary: Negative.  Negative for dysuria and hematuria.   Musculoskeletal: Negative.  Negative for arthralgias.  Skin: Negative.  Negative for rash.  Neurological: Negative.  Negative for dizziness and headaches.  Hematological: Negative.  Negative for adenopathy. Does not bruise/bleed easily.  Psychiatric/Behavioral: Negative.  Negative for depression and sleep disturbance. The patient is not nervous/anxious.      PAST MEDICAL/SURGICAL HISTORY:  Past Medical History:  Diagnosis Date  . Anemia   . B12 deficiency 12/07/2014  . CLL (chronic lymphocytic leukemia) ([place]) 01/24/2011  . Diabetes mellitus   . DM (diabetes mellitus) ([place]) 01/24/2011  . Eczema 01/24/2011  . History of pneumonia 04/2014  . Hypertension   . Leukemia ([place]) 6.22.2012   PER PATIENT  . Melanoma in situ ([place]) 01/24/2011  . Mild obesity 01/24/2011  . Port catheter in place 05/02/2012  . Shingles   . Ulcer    Past Surgical History:  Procedure Laterality Date  . bleeding ulcer    . CATARACT EXTRACTION W/PHACO  05/05/2012   Procedure: CATARACT EXTRACTION PHACO AND INTRAOCULAR LENS PLACEMENT (IOC);  Surgeon: Tonny Branch, MD;  Location: [facility] ORS;  Service: Ophthalmology;  Laterality: Right;  CDE:13.25  . CATARACT EXTRACTION W/PHACO   05/15/2012   Procedure: CATARACT EXTRACTION PHACO AND INTRAOCULAR LENS PLACEMENT (IOC);  Surgeon: Tonny Branch, MD;  Location: [facility] ORS;  Service: Ophthalmology;  Laterality: Left;  CDE:  12.32  . COLONOSCOPY N/A 01/30/2016   Procedure: COLONOSCOPY;  Surgeon: Danie Binder, MD;  Location: [facility] ENDO SUITE;  Service: Endoscopy;  Laterality: N/A;  2:00 PM - moved to 1:00 - office notified pt  . ESOPHAGOGASTRODUODENOSCOPY  2008   Dr. Oneida Alar: normal esophagus, antral erythema, 1 cm clean based duodenal ulcera, negative H. plyori  . ESOPHAGOGASTRODUODENOSCOPY N/A 01/30/2016   Procedure: ESOPHAGOGASTRODUODENOSCOPY (EGD);  Surgeon: Danie Binder, MD;  Location: [facility] END[facility] SUITE;  Service: Endoscopy;  Laterality: N/A;  . HERNIA REPAIR  2001  . PORTACATH  PLACEMENT  2008     SOCIAL HISTORY:  Social History   Social History  . Marital status: Married    Spouse name: N/A  . Number of children: N/A  . Years of education: N/A   Occupational History  . Not on file.   Social History Main Topics  . Smoking status: Former Research scientist (life sciences)  . Smokeless tobacco: Former Systems developer    Quit date: 04/25/1967  . Alcohol use No  . Drug use: No  . Sexual activity: Not on file   Other Topics Concern  . Not on file   Social History Narrative  . No narrative on file    FAMILY HISTORY:  Family History  Problem Relation Age of Onset  . Diabetes Father   . Colon cancer Neg Hx     CURRENT MEDICATIONS:  Outpatient Encounter Prescriptions as of 10/31/2016  Medication Sig  . ACCU-CHEK AVIVA PLUS test strip   . ACCU-CHEK SOFTCLIX LANCETS lancets   . acetaminophen (TYLENOL) 500 MG tablet Take 500 mg by mouth at bedtime as needed.   [name] acyclovir (ZOVIRAX) 400 MG tablet TAKE 1 TABLET (400 MG TOTAL) BY MOUTH DAILY.  [name] allopurinol (ZYLOPRIM) 100 MG tablet TAKE 1 TABLET BY MOUTH THREE TIMES DAILY  . cholecalciferol (VITAMIN D) 400 units TABS tablet Take 400 Units by mouth daily.  . IRON PO Take 1 tablet by mouth daily. Takes 2 to 3  times a week  . rosuvastatin (CRESTOR) 40 MG tablet Take 40 mg by mouth at bedtime.   . sevelamer carbonate (RENVELA) 800 MG tablet Take 2 tablets (1,600 mg total) by mouth 3 (three) times daily with meals. (Patient taking differently: Take 800 mg by mouth 3 (three) times daily with meals. )  . sitaGLIPtin (JANUVIA) 100 MG tablet Take 100 mg by mouth daily.  . sodium bicarbonate 650 MG tablet TAKE 1 TABLET BY MOUTH TWICE A DAY AS DIRECTED  . [DISCONTINUED] acyclovir (ZOVIRAX) 400 MG tablet TAKE 1 TABLET (400 MG TOTAL) BY MOUTH DAILY.   Facility-Administered Encounter Medications as of 10/31/2016  Medication  . heparin lock flush 100 unit/mL  . sodium chloride flush (NS) 0.9 % injection 10 mL    ALLERGIES:  Allergies  Allergen Reactions  . Aspirin Other (See Comments)    Bleeding ulcers.   . Ibuprofen Other (See Comments)    Bleeding ulcers.     PHYSICAL EXAM:  ECOG Performance status: 1 - Symptomatic, but largely independent.   Vitals:   10/31/16 1501  BP: 135/65  Resp: 18  Temp: 98.4 F (36.9 C)   Filed Weights   10/31/16 1501  Weight: 137 lb 6.4 oz (62.3 kg)    Physical Exam  Constitutional: He is oriented to person, place, and time and well-developed, well-nourished, and in no distress.  HENT:  Head: Normocephalic.  Mouth/Throat: Oropharynx is clear and moist. No oropharyngeal exudate.  Edentulous   Eyes: Conjunctivae are normal. Pupils are equal, round, and reactive to light. No scleral icterus.  Neck: Normal range of motion. Neck supple.  Cardiovascular: Normal rate, regular rhythm and normal heart sounds.   Pulmonary/Chest: Effort normal. No respiratory distress. He has no wheezes.  Abdominal: Soft. Bowel sounds are normal. There is no tenderness. There is no rebound and no guarding.  Musculoskeletal: Normal range of motion. He exhibits no edema.  Lymphadenopathy:    He has no cervical adenopathy.       Right: No supraclavicular adenopathy present.        Left:  No supraclavicular adenopathy present.  Neurological: He is alert and oriented to person, place, and time. No cranial nerve deficit. Gait normal.  Skin: Skin is warm and dry. No rash noted.  Psychiatric: Mood, memory, affect and judgment normal.  Nursing note and vitals reviewed.    LABORATORY DATA:  I have reviewed the labs as listed.  CBC    Component Value Date/Time   WBC 53.5 (HH) 10/31/2016 1419   RBC 2.83 (L) 10/31/2016 1419   HGB 10.6 (L) 10/31/2016 1419   HCT 32.0 (L) 10/31/2016 1419   PLT 136 (L) 10/31/2016 1419   MCV 113.1 (H) 10/31/2016 1419   MCH 37.5 (H) 10/31/2016 1419   MCHC 33.1 10/31/2016 1419   RDW 13.7 10/31/2016 1419   LYMPHSABS 47.1 (H) 10/31/2016 1419   MONOABS 0.5 10/31/2016 1419   EOSABS 2.7 (H) 10/31/2016 1419   BASOSABS 0.0 10/31/2016 1419   CMP Latest Ref Rng & Units 10/31/2016 09/28/2016 08/30/2016  Glucose 65 - 99 mg/dL 97 119(H) 160(H)  BUN 6 - 20 mg/dL 25(H) 23(H) 30(H)  Creatinine 0.61 - 1.24 mg/dL 2.02(H) 2.12(H) 2.24(H)  Sodium 135 - 145 mmol/L 137 138 137  Potassium 3.5 - 5.1 mmol/L 4.5 4.9 5.0  Chloride 101 - 111 mmol/L 106 106 101  CO2 22 - 32 mmol/L 24 26 30   Calcium 8.9 - 10.3 mg/dL 8.5(L) 8.8(L) 9.0  Total Protein 6.5 - 8.1 g/dL 6.6 6.9 7.3  Total Bilirubin 0.3 - 1.2 mg/dL 0.4 0.4 0.3  Alkaline Phos 38 - 126 U/L 77 78 72  AST 15 - 41 U/L 18 24 24   ALT 17 - 63 U/L 13(L) 17 18    PENDING LABS:    DIAGNOSTIC IMAGING:    PATHOLOGY:  Peripheral blood flow cytometry: 05/28/13    Cytogenetics [facility]): 06/03/13    ASSESSMENT & PLAN:   CLL:  - Historically, patient has not tolerated systemic treatment well. He remains on surveillance alone at this time.  - Reviewed his CBC with the patient today. He does have progressively worsening leukocytosis. Hemoglobin is still above 10 g/dl and platelet count above 100k.  - He will likely need to start treatment soon. Plan to treat with chlorambucil + obinutuzumab which is pretty  tolerable for frail patients. If he cannot tolerate 2 drug regimen, I will continue him on obinutuzumab alone.  NCCN guidelines for indication for treatment of CLL are:  A. Eligible for clinical trial  B. Significant disease-related symptoms   1. Fatigue (severe)   2. Night sweats   3. Weight loss   4. Fever without infection  C. Threatened end-organ function  D. Progressive bulky disease (spleen >6cm below costal margin, lymph nodes >10 cm)  E. Progressive anemia  F. Progressive thrombocytopenia.   Macrocytic anemia:  -MCV, MCH, & RDW remain elevated.  -He has been received monthly B12 injections.  Continue monthly B12.    Chronic kidney disease:  -Managed by Dr. Lowanda Foster; maintain appropriate follow-ups as clinically indicated.      Dispo:   -Return to cancer center in 3 months for continued follow-up with labs.    All questions were answered to patient's stated satisfaction. Encouraged patient to call with any new concerns or questions before his next visit to the cancer center and we can certain see him sooner, if needed.    Plan of care discussed with Dr. Talbert Cage, who agrees with the above aforementioned.    Orders placed this encounter:  Orders Placed This Encounter  Procedures  .  CBC with Differential  . Comprehensive metabolic panel

## 2016-11-02 ENCOUNTER — Encounter (HOSPITAL_BASED_OUTPATIENT_CLINIC_OR_DEPARTMENT_OTHER): Payer: Medicare Other

## 2016-11-02 ENCOUNTER — Encounter (HOSPITAL_COMMUNITY): Payer: Self-pay

## 2016-11-02 VITALS — BP 116/65 | HR 64 | Temp 97.8°F | Resp 18

## 2016-11-02 DIAGNOSIS — E538 Deficiency of other specified B group vitamins: Secondary | ICD-10-CM | POA: Diagnosis not present

## 2016-11-02 MED ORDER — CYANOCOBALAMIN 1000 MCG/ML IJ SOLN
1000.0000 ug | Freq: Once | INTRAMUSCULAR | Status: AC
  Administered 2016-11-02: 1000 ug via INTRAMUSCULAR

## 2016-11-02 MED ORDER — CYANOCOBALAMIN 1000 MCG/ML IJ SOLN
INTRAMUSCULAR | Status: AC
  Filled 2016-11-02: qty 1

## 2016-11-02 NOTE — Patient Instructions (Signed)
[place] at [facility] Discharge Instructions  RECOMMENDATIONS MADE BY THE CONSULTANT AND ANY TEST RESULTS WILL BE SENT TO YOUR REFERRING PHYSICIAN.  You got your b-12 injection today Follow up as scheduled.  Thank you for choosing [place] at [facility] to provide your oncology and hematology care.  To afford each patient quality time with our provider, please arrive at least 15 minutes before your scheduled appointment time.    If you have a lab appointment with the [facility] please come in thru the  Main Entrance and check in at the main information desk  You need to re-schedule your appointment should you arrive 10 or more minutes late.  We strive to give you quality time with our providers, and arriving late affects you and other patients whose appointments are after yours.  Also, if you no show three or more times for appointments you may be dismissed from the clinic at the providers discretion.     Again, thank you for choosing [facility].  Our hope is that these requests will decrease the amount of time that you wait before being seen by our physicians.       _____________________________________________________________  Should you have questions after your visit to [facility], please contact our office at [phone] between the hours of 8:30 a.m. and 4:30 p.m.  Voicemails left after 4:30 p.m. will not be returned until the following business day.  For prescription refill requests, have your pharmacy contact our office.       Resources For Cancer Patients and their Caregivers ? American Cancer Society: Can assist with transportation, wigs, general needs, runs Look Good Feel Better.        [phone] ? Cancer Care: Provides financial assistance, online support groups, medication/co-pay assistance.  1-800-813-HOPE 351-543-5994) ? [place] Assists [place] cancer  patients and their families through emotional , educational and financial support.  [phone] ? [place] DSS Where to apply for food stamps, Medicaid and utility assistance. ([phone] ? [employer]ATS: Transportation to medical appointments. ([phone] ? Social Security Administration: May apply for disability if have a Stage IV cancer. 585-195-8079 [phone] ? [employer], Disability and Transit Services: Assists with nutrition, care and transit needs. [place] Support Programs: @10RELATIVEDAYS @ > Cancer Support Group  2nd Tuesday of the month 1pm-2pm, Journey Room  > Creative Journey  3rd Tuesday of the month 1130am-1pm, Journey Room  > Look Good Feel Better  1st Wednesday of the month 10am-12 noon, Journey Room (Call [place] to register [phone])

## 2016-11-02 NOTE — Progress Notes (Signed)
Randall Reyes presents today for injection per MD orders. B12 1000 mcg administered IM in right deltoid. Administration without incident. Patient tolerated well. Patient discharged ambulatory and in stable condition from clinic with spouse. Follow up as scheduled.

## 2016-11-05 DIAGNOSIS — Z79899 Other long term (current) drug therapy: Secondary | ICD-10-CM | POA: Diagnosis not present

## 2016-11-05 DIAGNOSIS — E559 Vitamin D deficiency, unspecified: Secondary | ICD-10-CM | POA: Diagnosis not present

## 2016-11-05 DIAGNOSIS — D509 Iron deficiency anemia, unspecified: Secondary | ICD-10-CM | POA: Diagnosis not present

## 2016-11-05 DIAGNOSIS — R809 Proteinuria, unspecified: Secondary | ICD-10-CM | POA: Diagnosis not present

## 2016-11-05 DIAGNOSIS — N183 Chronic kidney disease, stage 3 (moderate): Secondary | ICD-10-CM | POA: Diagnosis not present

## 2016-11-05 DIAGNOSIS — I10 Essential (primary) hypertension: Secondary | ICD-10-CM | POA: Diagnosis not present

## 2016-11-07 DIAGNOSIS — E1129 Type 2 diabetes mellitus with other diabetic kidney complication: Secondary | ICD-10-CM | POA: Diagnosis not present

## 2016-11-07 DIAGNOSIS — N250 Renal osteodystrophy: Secondary | ICD-10-CM | POA: Diagnosis not present

## 2016-11-07 DIAGNOSIS — E875 Hyperkalemia: Secondary | ICD-10-CM | POA: Diagnosis not present

## 2016-11-07 DIAGNOSIS — N183 Chronic kidney disease, stage 3 (moderate): Secondary | ICD-10-CM | POA: Diagnosis not present

## 2016-11-07 DIAGNOSIS — R809 Proteinuria, unspecified: Secondary | ICD-10-CM | POA: Diagnosis not present

## 2016-11-13 ENCOUNTER — Other Ambulatory Visit (HOSPITAL_COMMUNITY): Payer: Self-pay | Admitting: Oncology

## 2016-11-13 DIAGNOSIS — C9110 Chronic lymphocytic leukemia of B-cell type not having achieved remission: Secondary | ICD-10-CM

## 2016-11-30 ENCOUNTER — Encounter (HOSPITAL_COMMUNITY): Payer: Self-pay

## 2016-11-30 ENCOUNTER — Encounter (HOSPITAL_COMMUNITY): Payer: Medicare Other | Attending: Oncology

## 2016-11-30 VITALS — BP 112/67 | HR 62 | Temp 97.9°F | Resp 18

## 2016-11-30 DIAGNOSIS — E538 Deficiency of other specified B group vitamins: Secondary | ICD-10-CM | POA: Diagnosis not present

## 2016-11-30 DIAGNOSIS — C9190 Lymphoid leukemia, unspecified not having achieved remission: Secondary | ICD-10-CM | POA: Insufficient documentation

## 2016-11-30 MED ORDER — SODIUM CHLORIDE 0.9% FLUSH
10.0000 mL | INTRAVENOUS | Status: DC | PRN
  Administered 2016-11-30: 10 mL via INTRAVENOUS
  Filled 2016-11-30: qty 10

## 2016-11-30 MED ORDER — HEPARIN SOD (PORK) LOCK FLUSH 100 UNIT/ML IV SOLN
500.0000 [IU] | Freq: Once | INTRAVENOUS | Status: AC
  Administered 2016-11-30: 500 [IU] via INTRAVENOUS

## 2016-11-30 MED ORDER — CYANOCOBALAMIN 1000 MCG/ML IJ SOLN
1000.0000 ug | Freq: Once | INTRAMUSCULAR | Status: AC
  Administered 2016-11-30: 1000 ug via INTRAMUSCULAR

## 2016-11-30 MED ORDER — CYANOCOBALAMIN 1000 MCG/ML IJ SOLN
INTRAMUSCULAR | Status: AC
  Filled 2016-11-30: qty 1

## 2016-11-30 MED ORDER — HEPARIN SOD (PORK) LOCK FLUSH 100 UNIT/ML IV SOLN
INTRAVENOUS | Status: AC
  Filled 2016-11-30: qty 5

## 2016-11-30 NOTE — Progress Notes (Signed)
Randall Reyes presents today for injection per MD orders. B12 1,022mcg administered IM in right Upper Arm. Administration without incident. Patient tolerated well.   Randall Reyes presented for Portacath access and flush. Portacath located right chest wall accessed with  H 20 needle. Good blood return present. Portacath flushed with 67ml NS and 500U/36ml Heparin and needle removed intact. Procedure without incident. Patient tolerated procedure well.  Vitals stable and discharged home from clinic ambulatory. Follow up as scheduled.

## 2016-12-07 DIAGNOSIS — N189 Chronic kidney disease, unspecified: Secondary | ICD-10-CM | POA: Diagnosis not present

## 2016-12-07 DIAGNOSIS — C9110 Chronic lymphocytic leukemia of B-cell type not having achieved remission: Secondary | ICD-10-CM | POA: Diagnosis not present

## 2016-12-07 DIAGNOSIS — R7301 Impaired fasting glucose: Secondary | ICD-10-CM | POA: Diagnosis not present

## 2016-12-07 DIAGNOSIS — R55 Syncope and collapse: Secondary | ICD-10-CM | POA: Diagnosis not present

## 2016-12-26 DIAGNOSIS — L570 Actinic keratosis: Secondary | ICD-10-CM | POA: Diagnosis not present

## 2016-12-26 DIAGNOSIS — Z85820 Personal history of malignant melanoma of skin: Secondary | ICD-10-CM | POA: Diagnosis not present

## 2016-12-26 DIAGNOSIS — L820 Inflamed seborrheic keratosis: Secondary | ICD-10-CM | POA: Diagnosis not present

## 2016-12-26 DIAGNOSIS — Z1283 Encounter for screening for malignant neoplasm of skin: Secondary | ICD-10-CM | POA: Diagnosis not present

## 2016-12-26 DIAGNOSIS — Z08 Encounter for follow-up examination after completed treatment for malignant neoplasm: Secondary | ICD-10-CM | POA: Diagnosis not present

## 2016-12-26 DIAGNOSIS — X32XXXD Exposure to sunlight, subsequent encounter: Secondary | ICD-10-CM | POA: Diagnosis not present

## 2016-12-31 ENCOUNTER — Encounter (HOSPITAL_COMMUNITY): Payer: Medicare Other | Attending: Oncology

## 2016-12-31 VITALS — BP 116/82 | HR 59 | Resp 16

## 2016-12-31 DIAGNOSIS — E538 Deficiency of other specified B group vitamins: Secondary | ICD-10-CM | POA: Diagnosis not present

## 2016-12-31 DIAGNOSIS — C9190 Lymphoid leukemia, unspecified not having achieved remission: Secondary | ICD-10-CM | POA: Insufficient documentation

## 2016-12-31 MED ORDER — CYANOCOBALAMIN 1000 MCG/ML IJ SOLN
INTRAMUSCULAR | Status: AC
  Filled 2016-12-31: qty 1

## 2016-12-31 MED ORDER — CYANOCOBALAMIN 1000 MCG/ML IJ SOLN
1000.0000 ug | Freq: Once | INTRAMUSCULAR | Status: AC
  Administered 2016-12-31: 1000 ug via INTRAMUSCULAR

## 2017-01-01 ENCOUNTER — Encounter (HOSPITAL_COMMUNITY): Payer: Self-pay

## 2017-01-01 NOTE — Progress Notes (Signed)
Randall Reyes presents today for injection per MD orders. B12 1,018mcg administered IM in right Upper Arm. Administration without incident. Patient tolerated well. Vitals stable and discharged home from clinic ambulatory

## 2017-01-08 DIAGNOSIS — D509 Iron deficiency anemia, unspecified: Secondary | ICD-10-CM | POA: Diagnosis not present

## 2017-01-08 DIAGNOSIS — E785 Hyperlipidemia, unspecified: Secondary | ICD-10-CM | POA: Diagnosis not present

## 2017-01-08 DIAGNOSIS — R7301 Impaired fasting glucose: Secondary | ICD-10-CM | POA: Diagnosis not present

## 2017-01-09 ENCOUNTER — Other Ambulatory Visit (HOSPITAL_COMMUNITY): Payer: Self-pay | Admitting: Oncology

## 2017-01-09 DIAGNOSIS — C9110 Chronic lymphocytic leukemia of B-cell type not having achieved remission: Secondary | ICD-10-CM

## 2017-01-10 ENCOUNTER — Telehealth (HOSPITAL_COMMUNITY): Payer: Self-pay

## 2017-01-10 DIAGNOSIS — E785 Hyperlipidemia, unspecified: Secondary | ICD-10-CM | POA: Diagnosis not present

## 2017-01-10 DIAGNOSIS — E1165 Type 2 diabetes mellitus with hyperglycemia: Secondary | ICD-10-CM | POA: Diagnosis not present

## 2017-01-10 DIAGNOSIS — N189 Chronic kidney disease, unspecified: Secondary | ICD-10-CM | POA: Diagnosis not present

## 2017-01-10 DIAGNOSIS — M109 Gout, unspecified: Secondary | ICD-10-CM | POA: Diagnosis not present

## 2017-01-10 DIAGNOSIS — C9110 Chronic lymphocytic leukemia of B-cell type not having achieved remission: Secondary | ICD-10-CM | POA: Diagnosis not present

## 2017-01-10 NOTE — Telephone Encounter (Signed)
I have requested labs be faxed to [place]. He denies any other symptoms.

## 2017-01-10 NOTE — Telephone Encounter (Signed)
Patients PCP, Dr. [place] Crane, called stating patients WBC has went up drastically. It has gone from 58 to 158. He is not having any symptoms. Dr Nevada Crane requested he be seen sooner than his 01/31/17 scheduled appointment. Explained to Dr. Nevada Crane that Dr. Talbert Cage is double and triple booked but he still would like him seen sooner. Message sent to NP and scheduling.

## 2017-01-10 NOTE — Telephone Encounter (Signed)
Please have Dr. Nevada Crane send [place] a copy of patient's most recent labs.  Please find out if patient is losing weight without trying, having any bleeding, fevers, night sweats, etc.  We need to review his labs and better understand any current symptoms he is having and likely bring him in sooner than September.    Mike Craze, NP [place] [phone]

## 2017-01-15 NOTE — Assessment & Plan Note (Signed)
B12 deficiency, on monthly B12 1000 mcg monthly.

## 2017-01-15 NOTE — Assessment & Plan Note (Addendum)
CLL with history of poor tolerance to past chemotherapies.  Now with progressive anemia and thrombocytopenia and rising WBC/leukocytes concerning for progression of disease and need to re-institute therapy ASAP.  Past treatment includes FCR in 2010, Imbruvica 2015 (located by large leg rash), Zydelig 0277 (complicated by transaminitis despite dose reduction), Imbruvica rechallenge in 4128 (complicated by infection and progressive fatigue), bendamustine/Rituxan 1 cycle in 7867 (complicated by prolonged and significant cytopenia in addition to tumor lysis syndrome and renal failure).  Labs today: CBC diff, CMET, LDH, phosphorus.  I personally reviewed and went over laboratory results with the patient.  The results are noted within this dictation.  I will ask for FISH testing to evaluate for del(17p) and TP53 mutation since we are considering re-instituting therapy soon.  Labs today demonstrate a significant leukocytosis and progressive anemia.  As a result of progressive cytopenia, we will need to pursue therapy.  He is S/P the following treatments FCR in 2010, Imbruvica in 6720 (complicated by rash), Zydelig (9470) complicated by transaminitis, rechallenge with Imbruvica (2016) and not tolerated due to significant increase in fatigue, and BR (9628) x 1 cycle complicated by cytopenias and renal failure (likely tumor lysis).    Recommended treatment is Obinutuzumab + Chlorambucil.  Treatment plan is built.  Chlorambucil Rx is printed and given to Angie.  Will plan to start next week (72 hours needed for insurance approval).  Will add additional labs today: uric acid, hepatitis B testing.  He will continue Allopurinol at 50% dose reduction due to his CrCl being 25.    Will need to monitor for tumor lysis syndrome CLOSELY.  Therefore, he will have labs on day and 2 of treatment and then three times per week for cycle #1.  I would not hesitate to give him Rasburicase with treatment if needed.  If uric acid  is elevated today, I recommend adding Rasburicase to Day 1 cycle #1 of treatment plan.  Return in 2 weeks for follow-up, day 8 of cycle 1.

## 2017-01-18 ENCOUNTER — Telehealth (HOSPITAL_COMMUNITY): Payer: Self-pay | Admitting: Adult Health

## 2017-01-18 ENCOUNTER — Encounter (HOSPITAL_BASED_OUTPATIENT_CLINIC_OR_DEPARTMENT_OTHER): Payer: Medicare Other | Admitting: Oncology

## 2017-01-18 ENCOUNTER — Encounter (HOSPITAL_COMMUNITY): Payer: Medicare Other | Attending: Oncology

## 2017-01-18 VITALS — BP 118/65 | HR 59 | Temp 98.1°F | Resp 16 | Ht 61.0 in | Wt 138.0 lb

## 2017-01-18 DIAGNOSIS — D649 Anemia, unspecified: Secondary | ICD-10-CM | POA: Diagnosis not present

## 2017-01-18 DIAGNOSIS — C9110 Chronic lymphocytic leukemia of B-cell type not having achieved remission: Secondary | ICD-10-CM

## 2017-01-18 DIAGNOSIS — E538 Deficiency of other specified B group vitamins: Secondary | ICD-10-CM

## 2017-01-18 DIAGNOSIS — D696 Thrombocytopenia, unspecified: Secondary | ICD-10-CM | POA: Diagnosis not present

## 2017-01-18 LAB — COMPREHENSIVE METABOLIC PANEL
ALK PHOS: 106 U/L (ref 38–126)
ALT: 13 U/L — AB (ref 17–63)
ANION GAP: 7 (ref 5–15)
AST: 20 U/L (ref 15–41)
Albumin: 3.9 g/dL (ref 3.5–5.0)
BILIRUBIN TOTAL: 0.5 mg/dL (ref 0.3–1.2)
BUN: 24 mg/dL — ABNORMAL HIGH (ref 6–20)
CALCIUM: 8.7 mg/dL — AB (ref 8.9–10.3)
CO2: 25 mmol/L (ref 22–32)
CREATININE: 2.1 mg/dL — AB (ref 0.61–1.24)
Chloride: 107 mmol/L (ref 101–111)
GFR, EST AFRICAN AMERICAN: 33 mL/min — AB (ref >60)
GFR, EST NON AFRICAN AMERICAN: 28 mL/min — AB (ref >60)
Glucose, Bld: 120 mg/dL — ABNORMAL HIGH (ref 65–99)
Potassium: 5.2 mmol/L — ABNORMAL HIGH (ref 3.5–5.1)
Sodium: 139 mmol/L (ref 135–145)
TOTAL PROTEIN: 7 g/dL (ref 6.5–8.1)

## 2017-01-18 LAB — CBC WITH DIFFERENTIAL/PLATELET
BAND NEUTROPHILS: 0 %
BASOS ABS: 0 10*3/uL (ref 0.0–0.1)
BLASTS: 0 %
Basophils Relative: 0 %
EOS ABS: 4.1 10*3/uL — AB (ref 0.0–0.7)
EOS PCT: 2 %
HEMATOCRIT: 28.5 % — AB (ref 39.0–52.0)
HEMOGLOBIN: 9.3 g/dL — AB (ref 13.0–17.0)
LYMPHS ABS: 196.9 10*3/uL — AB (ref 0.7–4.0)
LYMPHS PCT: 95 %
MCH: 37.8 pg — AB (ref 26.0–34.0)
MCHC: 32.6 g/dL (ref 30.0–36.0)
MCV: 115.9 fL — AB (ref 78.0–100.0)
METAMYELOCYTES PCT: 0 %
MONOS PCT: 0 %
Monocytes Absolute: 0 10*3/uL — ABNORMAL LOW (ref 0.1–1.0)
Myelocytes: 0 %
NEUTROS ABS: 6.2 10*3/uL (ref 1.7–7.7)
Neutrophils Relative %: 3 %
Other: 0 %
Platelets: 129 10*3/uL — ABNORMAL LOW (ref 150–400)
Promyelocytes Absolute: 0 %
RBC: 2.46 MIL/uL — AB (ref 4.22–5.81)
RDW: 15.6 % — ABNORMAL HIGH (ref 11.5–15.5)
WBC: 207.2 10*3/uL (ref 4.0–10.5)
nRBC: 0 /100 WBC

## 2017-01-18 LAB — URIC ACID: Uric Acid, Serum: 2.5 mg/dL — ABNORMAL LOW (ref 4.4–7.6)

## 2017-01-18 LAB — LACTATE DEHYDROGENASE: LDH: 236 U/L — AB (ref 98–192)

## 2017-01-18 LAB — PHOSPHORUS: PHOSPHORUS: 4.2 mg/dL (ref 2.5–4.6)

## 2017-01-18 MED ORDER — CHLORAMBUCIL 2 MG PO TABS: ORAL_TABLET | ORAL | 0 refills | Status: DC

## 2017-01-18 MED ORDER — CHLORAMBUCIL 2 MG PO TABS: 0.5000 mg/kg | ORAL_TABLET | Freq: Every day | ORAL | 0 refills | Status: DC

## 2017-01-18 NOTE — Progress Notes (Signed)
[name], MD [place] [address]  CLL (chronic lymphocytic leukemia) [facility]) - Plan: chlorambucil (LEUKERAN) 2 MG tablet, Uric acid, CBC with Differential, Comprehensive metabolic panel, Lactate dehydrogenase, Phosphorus, Magnesium, Hepatitis B core antibody, IgM, Hepatitis B surface antigen, Uric acid, CANCELED: Uric acid  B12 deficiency  CURRENT THERAPY: Planning to start treatment next week.  INTERVAL HISTORY: Randall Reyes 81 y.o. male returns for followup of of CLL with significantly increasing white blood cell count and worsening cytopenias.   HPI Elements   Location: Blood   Quality: CLL   Severity: Severe   Duration: Diagnosis in 2010   Context: Status post multiple lines of treatment with multiple complications including cytopenias and tumor lysis syndrome   Timing:   Modifying Factors:   Associated Signs & Symptoms:    He denies any complaints today including B symptoms. His weight is stable. His appetite is stable. He remained active and denies any severe fatigue.  Review of Systems  Constitutional: Negative.  Negative for chills, fever and weight loss.  HENT: Negative.   Eyes: Negative.   Respiratory: Negative.  Negative for cough.   Cardiovascular: Negative.  Negative for chest pain.  Gastrointestinal: Negative.  Negative for blood in stool, constipation, diarrhea, melena, nausea and vomiting.  Genitourinary: Negative.   Musculoskeletal: Negative.   Skin: Negative.   Neurological: Negative.  Negative for weakness.  Endo/Heme/Allergies: Negative.   Psychiatric/Behavioral: Negative.     Past Medical History:  Diagnosis Date  . Anemia   . B12 deficiency 12/07/2014  . CLL (chronic lymphocytic leukemia) ([place]) 01/24/2011  . Diabetes mellitus   . DM (diabetes mellitus) ([place]) 01/24/2011  . Eczema 01/24/2011  . History of pneumonia 04/2014  . Hypertension   . Leukemia ([place]) 6.22.2012   PER PATIENT  . Melanoma in situ ([place])  01/24/2011  . Mild obesity 01/24/2011  . Port catheter in place 05/02/2012  . Shingles   . Ulcer     Past Surgical History:  Procedure Laterality Date  . bleeding ulcer    . CATARACT EXTRACTION W/PHACO  05/05/2012   Procedure: CATARACT EXTRACTION PHACO AND INTRAOCULAR LENS PLACEMENT (IOC);  Surgeon: Tonny Branch, MD;  Location: [facility] ORS;  Service: Ophthalmology;  Laterality: Right;  CDE:13.25  . CATARACT EXTRACTION W/PHACO  05/15/2012   Procedure: CATARACT EXTRACTION PHACO AND INTRAOCULAR LENS PLACEMENT (IOC);  Surgeon: Tonny Branch, MD;  Location: [facility] OR[facility];  Service: Ophthalmology;  Laterality: Left;  CDE:  12.32  . COLONOSCOPY N/A 01/30/2016   Procedure: COLONOSCOPY;  Surgeon: Danie Binder, MD;  Location: [facility] END[facility] SU[facility];  Service: Endoscopy;  Laterality: N/A;  2:00 PM - moved to 1:00 - office notified pt  . ESOPHAGOGASTRODUODENOSCOPY  2008   Dr. Oneida Alar: normal esophagus, antral erythema, 1 cm clean based duodenal ulcera, negative H. plyori  . ESOPHAGOGASTRODUODENOSCOPY N/A 01/30/2016   Procedure: ESOPHAGOGASTRODUODENOSCOPY (EGD);  Surgeon: Danie Binder, MD;  Location: [facility];  Service: Endoscopy;  Laterality: N/A;  . HERNIA REPAIR  2001  . PORTACATH PLACEMENT  2008    Family History  Problem Relation Age of Onset  . Diabetes Father   . Colon cancer Neg Hx     Social History   Social History  . Marital status: Married    Spouse name: N/A  . Number of children: N/A  . Years of education: N/A   Social History Main Topics  . Smoking status: Former Research scientist (life sciences)  . Smokeless tobacco:  Former Systems developer    Quit date: 04/25/1967  . Alcohol use No  . Drug use: No  . Sexual activity: Not on file   Other Topics Concern  . Not on file   Social History Narrative  . No narrative on file     PHYSICAL EXAMINATION  ECOG PERFORMANCE STATUS: 1 - Symptomatic but completely ambulatory  Vitals:   01/18/17 1018  BP: 118/65  Pulse: (!) 59  Resp: 16  Temp: 98.1 F (36.7 C)  SpO2: 98%     GENERAL:alert, no distress, well nourished, well developed, comfortable, cooperative, smiling and accompanied by wife. SKIN: skin color, texture, turgor are normal, no rashes or significant lesions HEAD: Normocephalic, No masses, lesions, tenderness or abnormalities EYES: normal, EOMI, Conjunctiva are pink and non-injected EARS: External ears normal OROPHARYNX:lips, buccal mucosa, and tongue normal and mucous membranes are moist  NECK: supple, no adenopathy LYMPH:  no palpable lymphadenopathy BREAST:not examined LUNGS: clear to auscultation and percussion HEART: regular rate & rhythm ABDOMEN:abdomen soft and normal bowel sounds BACK: Back symmetric, no curvature. EXTREMITIES:less then 2 second capillary refill, no joint deformities, effusion, or inflammation, no skin discoloration, no cyanosis  NEURO: alert & oriented x 3 with fluent speech, no focal motor/sensory deficits, gait normal   LABORATORY DATA: CBC    Component Value Date/Time   WBC 207.2 (HH) 01/18/2017 0924   RBC 2.46 (L) 01/18/2017 0924   HGB 9.3 (L) 01/18/2017 0924   HCT 28.5 (L) 01/18/2017 0924   PLT 129 (L) 01/18/2017 0924   MCV 115.9 (H) 01/18/2017 0924   MCH 37.8 (H) 01/18/2017 0924   MCHC 32.6 01/18/2017 0924   RDW 15.6 (H) 01/18/2017 0924   LYMPHSABS 196.9 (H) 01/18/2017 0924   MONOABS 0.0 (L) 01/18/2017 0924   EOSABS 4.1 (H) 01/18/2017 0924   BASOSABS 0.0 01/18/2017 0924      Chemistry      Component Value Date/Time   NA 139 01/18/2017 0924   K 5.2 (H) 01/18/2017 0924   CL 107 01/18/2017 0924   CO2 25 01/18/2017 0924   BUN 24 (H) 01/18/2017 0924   CREATININE 2.10 (H) 01/18/2017 0924      Component Value Date/Time   CALCIUM 8.7 (L) 01/18/2017 0924   CALCIUM 8.3 (L) 02/03/2015 0639   ALKPHOS 106 01/18/2017 0924   AST 20 01/18/2017 0924   ALT 13 (L) 01/18/2017 0924   BILITOT 0.5 01/18/2017 0924     Labs from primary care office on 01/08/2017 demonstrated a white blood cell count of 158.2,  hemoglobin 9.5, platelet count 127K with a lymphocytosis of 136.1. Renal function was stable and similar to results that we have on record.   PENDING LABS:   RADIOGRAPHIC STUDIES:  No results found.   PATHOLOGY:    ASSESSMENT AND PLAN:  CLL (chronic lymphocytic leukemia) ([place]) CLL with history of poor tolerance to past chemotherapies.  Now with progressive anemia and thrombocytopenia and rising WBC/leukocytes concerning for progression of disease and need to re-institute therapy ASAP.  Past treatment includes FCR in 2010, Imbruvica 2015 (located by large leg rash), Zydelig 7902 (complicated by transaminitis despite dose reduction), Imbruvica rechallenge in 4097 (complicated by infection and progressive fatigue), bendamustine/Rituxan 1 cycle in 3532 (complicated by prolonged and significant cytopenia in addition to tumor lysis syndrome and renal failure).  Labs today: CBC diff, CMET, LDH, phosphorus.  I personally reviewed and went over laboratory results with the patient.  The results are noted within this dictation.  I will ask for  FISH testing to evaluate for del(17p) and TP53 mutation since we are considering re-instituting therapy soon.  Labs today demonstrate a significant leukocytosis and progressive anemia.  As a result of progressive cytopenia, we will need to pursue therapy.  He is S/P the following treatments FCR in 2010, Imbruvica in 4712 (complicated by rash), Zydelig (5271) complicated by transaminitis, rechallenge with Imbruvica (2016) and not tolerated due to significant increase in fatigue, and BR (2929) x 1 cycle complicated by cytopenias and renal failure (likely tumor lysis).    Recommended treatment is Obinutuzumab + Chlorambucil.  Treatment plan is built.  Chlorambucil Rx is printed and given to Angie.  Will plan to start next week (72 hours needed for insurance approval).  Will add additional labs today: uric acid, hepatitis B testing.  He will continue Allopurinol at  50% dose reduction due to his CrCl being 25.    Will need to monitor for tumor lysis syndrome CLOSELY.  Therefore, he will have labs on day and 2 of treatment and then three times per week for cycle #1.  I would not hesitate to give him Rasburicase with treatment if needed.  If uric acid is elevated today, I recommend adding Rasburicase to Day 1 cycle #1 of treatment plan.  Return in 2 weeks for follow-up, day 8 of cycle 1.      B12 deficiency B12 deficiency, on monthly B12 1000 mcg monthly.   ORDERS PLACED FOR THIS ENCOUNTER: Orders Placed This Encounter  Procedures  . Uric acid  . CBC with Differential  . Comprehensive metabolic panel  . Lactate dehydrogenase  . Phosphorus  . Magnesium  . Hepatitis B core antibody, IgM  . Hepatitis B surface antigen  . Uric acid    MEDICATIONS PRESCRIBED THIS ENCOUNTER: Meds ordered this encounter  Medications  . chlorambucil (LEUKERAN) 2 MG tablet    Sig: Take 16 tablets (32 mg total) by mouth daily. Give on an empty stomach 1 hour before or 2 hours after meals.    Dispense:  64 tablet    Refill:  0    Cycle #1.  Take on an empty stomach.    Order Specific Question:   Supervising Provider    Answer:   Brunetta Genera [[id number]]    THERAPY PLAN:  Plan to start Obinutuzumab + Chlorambucil next week.  All questions were answered. The patient knows to call the clinic with any problems, questions or concerns. We can certainly see the patient much sooner if necessary.  Patient and plan discussed with Dr. Twana First and she is in agreement with the aforementioned.   This note is electronically signed by: Robynn Pane, PA-C 01/18/2017 11:15 AM

## 2017-01-18 NOTE — Telephone Encounter (Signed)
FAXED [place]

## 2017-01-18 NOTE — Progress Notes (Signed)
CRITICAL VALUE ALERT Critical value received:  WBC- 207.2  Date of notification:  01/18/17 Time of notification: 2481 Critical value read back:  Yes.   Nurse who received alert:  M.Meha Vidrine, LPN MD notified (1st page):  T.Kefalas, PA

## 2017-01-18 NOTE — Patient Instructions (Signed)
[place] at [facility]  Discharge Instructions:  You saw Kirby Crigler, PD, today.  Continue the allopurinol dose. Return next to week to start treatments.  Lab work today.   _______________________________________________________________  Thank you for choosing [place] at [facility] to provide your oncology and hematology care.  To afford each patient quality time with our providers, please arrive at least 15 minutes before your scheduled appointment.  You need to re-schedule your appointment if you arrive 10 or more minutes late.  We strive to give you quality time with our providers, and arriving late affects you and other patients whose appointments are after yours.  Also, if you no show three or more times for appointments you may be dismissed from the clinic.  Again, thank you for choosing [place] at [place] hope is that these requests will allow you access to exceptional care and in a timely manner. _______________________________________________________________  If you have questions after your visit, please contact our office at [phone] between the hours of 8:30 a.m. and 5:00 p.m. Voicemails left after 4:30 p.m. will not be returned until the following business day. _______________________________________________________________  For prescription refill requests, have your pharmacy contact our office. _______________________________________________________________  Recommendations made by the consultant and any test results will be sent to your referring physician. _______________________________________________________________

## 2017-01-18 NOTE — Addendum Note (Signed)
Addended by: Baird Cancer on: 01/18/2017 12:32 PM   Modules accepted: Orders

## 2017-01-19 LAB — HEPATITIS B SURFACE ANTIGEN: HEP B S AG: NEGATIVE

## 2017-01-19 LAB — HEPATITIS B CORE ANTIBODY, IGM: Hep B C IgM: NEGATIVE

## 2017-01-21 ENCOUNTER — Other Ambulatory Visit (HOSPITAL_COMMUNITY): Payer: Self-pay | Admitting: Pharmacist

## 2017-01-22 ENCOUNTER — Encounter (HOSPITAL_COMMUNITY): Payer: Self-pay | Admitting: Emergency Medicine

## 2017-01-22 MED ORDER — ALLOPURINOL 300 MG PO TABS: 300.0000 mg | ORAL_TABLET | Freq: Every day | ORAL | 0 refills | Status: DC

## 2017-01-22 MED ORDER — ACYCLOVIR 400 MG PO TABS: 400.0000 mg | ORAL_TABLET | Freq: Every day | ORAL | 5 refills | Status: DC

## 2017-01-22 MED ORDER — PROCHLORPERAZINE MALEATE 10 MG PO TABS: 10.0000 mg | ORAL_TABLET | Freq: Four times a day (QID) | ORAL | 2 refills | Status: DC | PRN

## 2017-01-22 MED ORDER — LIDOCAINE-PRILOCAINE 2.5-2.5 % EX CREA: TOPICAL_CREAM | CUTANEOUS | 2 refills | Status: DC

## 2017-01-22 MED ORDER — SULFAMETHOXAZOLE-TRIMETHOPRIM 800-160 MG PO TABS: 1.0000 | ORAL_TABLET | ORAL | 5 refills | Status: DC

## 2017-01-22 MED ORDER — ONDANSETRON HCL 8 MG PO TABS: 8.0000 mg | ORAL_TABLET | Freq: Three times a day (TID) | ORAL | 2 refills | Status: DC | PRN

## 2017-01-22 NOTE — Progress Notes (Signed)
Chemotherapy teaching pulled together, appts made, calender made for pt.

## 2017-01-22 NOTE — Patient Instructions (Signed)
[facility]   CHEMOTHERAPY INSTRUCTIONS  We are going to start you on gazyva and leukeran for your CLL.   You will see the doctor regularly throughout treatment.  We also monitor your blood work when you come in for your doctors appointments.  The doctor monitors your response to treatment by the way you are feeling, your blood work, and scans periodically.  There will be wait times while you are here for treatment.  Lab work will take about 30 minutes to 1 hour to result.  Then there will be a wait time while the pharmacy mixes your medications.     You will receive the following pre-medications prior to each chemotherapy treatment: Premeds: Tylenol and benadryl:  Help prevent a reaction to the chemotherapy. Dexamethasone - steroid - given to reduce the risk of you having an allergic type reaction to the chemotherapy. Dex can cause you to feel energized, nervous/anxious/jittery, make you have trouble sleeping, and/or make you feel hot/flushed in the face/neck and/or look pink/red in the face/neck. These side effects will pass as the Dex wears off. (takes 30 minutes to infuse)   POTENTIAL SIDE EFFECTS OF TREATMENT:  Chlorambucil (Leukeran)  About This Drug Chlorambucil is used to treat cancer. It is given orally (by mouth).    Possible Side Effects . Bone marrow depression. This is a decrease in the number of white blood cells, red blood cells, and platelets. This may raise your risk of infection, make you tired and weak (fatigue), and raise your risk of bleeding. . Fever . Effects on the nerves are called peripheral neuropathy. You may feel numbness, tingling, or pain in your hands and feet. It may be hard for you to button your clothes, open jars, or walk as usual. The effect on the nerves may get worse with more doses of the drug. These effects get better in some people after the drug is stopped but it does not get better in all people. . Rash and  allergic skin reactions . Nausea and throwing up (vomiting) . Loose bowel movements (diarrhea) . Soreness of the mouth and throat. You may have red areas, white patches, or sores that hurt. . In women, menstrual bleeding may become irregular or stop while you are getting this drug. Do not assume that you cannot become pregnant if you do not have a menstrual period. Note: Not all possible side effects are included above.  Warnings and Precautions . This drug may raise your risk of getting a second cancer such as acute leukemia . Seizure. Common symptoms of a seizure can include confusion, blacking out, passing out, loss of hearing or vision, blurred vision, unusual smells or tastes (such as burning rubber), trouble talking, tremors or shaking in parts or all of the body, repeated body movements, tense muscles that do not relax, and loss of control of urine and bowels. There are other less common symptoms of seizures. If you or your family member suspects you are having a seizure, call 911 right away. . Severe bone marrow depression . Changes in your liver function . Severe allergic skin reaction. You may develop blisters on your skin that are filled with fluid or a severe red rash all over your body that may be painful. . Inflammation (swelling) or thickening of the lung tissues. You may have a dry cough or trouble breathing. Note: Some of the side effects above are very rare. If you have concerns and/or questions, please discuss them with  your medical team.  Important Information . Talk to your doctor before receiving any vaccinations during your treatment. Some vaccinations are not recommended while receiving chlorambucil.  How to Take Your Medication . Swallow the medicine whole without food. . Handling: Wash your hands after handling your medicine, your caretakers should not handle your medicine with bare hands and should wear latex gloves. . This drug may be present in the saliva,  tears, sweat, urine, stool, vomit, semen, and vaginal secretions. Talk to your doctor and/or your nurse about the necessary precautions to take during this time. . Storage: Store this medicine in the original container at room temperature. Discuss with your nurse or your doctor how to dispose of unused medicine  Treating Side Effects . Manage tiredness by pacing your activities for the day . Be sure to include periods of rest between energy-draining activities . To decrease infection, wash your hands regularly . Avoid close contact with people who have a cold, the flu, or other infections . Take your temperature as your doctor or nurse tells you, and whenever you feel like you may have a fever . To help decrease bleeding, use a soft toothbrush. Check with your nurse before using dental floss. . Be very careful when using knives or tools . Use an electric shaver instead of a razor . Drink plenty of fluids (a minimum of eight glasses per day is recommended) . If you throw up or have loose bowel movements, you should drink more fluids so that you do not become dehydrated (lack water in the body from losing too much fluid) . If you get diarrhea, eat low-fiber foods that are high in protein and calories and avoid foods that can irritate your digestive tracts or lead to cramping . Ask your nurse or doctor about medicine that can lessen or stop your diarrhea . To help with nausea and vomiting, eat small, frequent meals instead of three large meals a day Choose foods and drinks that are at room temperature. Ask your nurse or doctor about other helpful tips and medicine that is available to help or stop lessen these symptoms. . Mouth care is very important. Your mouth care should consist of routine, gentle cleaning of your teeth or dentures and rinsing your mouth with a mixture of 1/2 teaspoon of salt in 8 ounces of water or  teaspoon of baking soda in 8 ounces of water. This should be done at  least after each meal and at bedtime. . If you have mouth sores, avoid mouthwash that has alcohol. Also avoid alcohol and smoking because they can bother your mouth and throat . If you get a rash do not put anything on it unless your doctor or nurse says you may. Keep the area around the rash clean and dry. Ask your doctor for medicine if your rash bothers you. . If you have numbness and tingling in your hands and feet, be careful when cooking, walking, and handling sharp objects and hot liquids.  Food and Drug Interactions . There are no known interactions of chlorambucil with food, however this medication should be taken on an empty stomach. . There are no known interactions of chlorambucil with other medications. Tell your doctor and pharmacist about all the medicines and dietary supplements (vitamins, minerals, herbs and others) that you are taking at this time. The safety and use of dietary supplements and alternative diets are often not known. Using these might affect your cancer or interfere with your treatment. Until more is  known, you should not use dietary supplements or alternative diets without your cancer doctor's help.  When to Call the Doctor Call your doctor or nurse if you have any of these symptoms and/or any new or unusual symptoms: . Fever of 100.5 F (38 C) or higher . Chills . Fatigue that interferes with your daily activities . Feeling dizzy or lightheaded . Easy bleeding or bruising . Pain in your chest . Dry cough . Trouble breathing . Loose bowel movements (diarrhea) 4 times a day or loose bowel movements with lack of strength or a feeling of being dizzy . Nausea that stops you from eating or drinking and/or is not relieved by prescribed medicines . Throwing up more than 3 times a day . Pain in your mouth or throat that makes it hard to eat or drink . New rash and/or itching . Rash that is not relieved by prescribed medicines . Flu-like symptoms: fever,  headache, muscle and joint aches, and fatigue (low energy, feeling weak) . Symptoms of a seizure such as confusion, blacking out, passing out, loss of hearing or vision, blurred vision, unusual smells or tastes (such as burning rubber), trouble talking, tremors or shaking in parts or all of the body, repeated body movements, tense muscles that do not relax, and loss of control of urine and bowels. If you or your family member suspects you are having a seizure, call 911 right away. . Signs of possible liver problems: dark urine, pale bowel movements, bad stomach pain, feeling very tired and weak, unusual itching, or yellowing of the eyes or skin . Numbness, tingling, or pain your hands and feet . If you think you may be pregnant  Reproduction Warnings . Pregnancy warning: This drug can have harmful effects on the unborn baby. Women of child bearing potential should use effective methods of birth control during your cancer treatment. Let your doctor know right away if you think you may be pregnant. . Breastfeeding warning: It is not known if this drug passes into breast milk. For this reason, women should talk to their doctor about the risks and benefits of breast feeding during treatment with this drug because this drug may enter the breast milk and cause harm to a breast feeding baby . Fertility warning: In men and women both, this drug may affect your ability to have children in the future. Talk with your doctor or nurse if you plan to have children. Ask for information on sperm or egg banking.    Obinutuzumab Dyann Kief)  About This Drug Obinutuzumab is used to treat cancer. It is given in the vein (IV).  Possible Side Effects . A decrease in the number of white blood cells, and platelets. This may raise your risk of infection, and raise your risk of bleeding . Fever . Tiredness and weakness . Headache . Hair loss. Hair loss is often temporary, although with certain medicine, hair  loss can sometimes be permanent. Hair loss may happen suddenly or gradually. If you lose hair, you may lose it from your head, face, armpits, pubic area, chest, and/or legs. You may also notice your hair getting thin. . Trouble sleeping (insomnia) . Sinus inflammation . Cough . Upper respiratory infection . Pneumonia . Itching . Decreased appetite (decreased hunger) . Nausea and throwing up (vomiting) . Loose bowel movements (diarrhea) . Constipation (unable to move bowels) . Joint, bone and muscle pain . Herpesvirus infection . Urinary tract infection. Symptoms may include: . Pain or burning when you pass urine. [name]  Feeling like you have to pass urine often, but not much comes out when you do. . Tender or heavy feeling in your lower abdomen. . Cloudy urine and/or urine that smells bad. . Pain on one side of your back under your ribs. This is where your kidneys are. . Fever, chills, nausea and/or throwing up. . While you are getting this drug in your vein (IV), you may have a reaction to the drug. Sometimes you may be given medication to stop or lessen these side effects. Your nurse will check you closely for these signs: fever or shaking chills, flushing, facial swelling, feeling dizzy, headache, trouble breathing, rash, itching, chest tightness, or chest pain. These reactions may happen after your infusion. If this happens, call 911 for emergency care. Note: Each of the side effects above was reported in 10% or greater of patients treated with obinotuzumab. Not all possible side effects are included above.  Warnings and Precautions . Severe infusion reactions and allergic reactions which can rarely be life-threatening . Severe decrease in the numbers of white blood cells and platelets which can rarely be lifethreatening . Severe infections, including viral, bacterial and fungal, which can very rarely be life-threatening . Reactivation of the hepatitis B virus if you have been  exposed in the past, which may very rarely cause liver failure and be life-threatening . Changes in your central nervous system can happen. The central nervous system is made up of your brain and spinal cord. You could feel extreme tiredness, agitation, confusion, hallucinations (see or hear things that are not there), have trouble understanding or speaking, loss of control of your bowels or bladder, eyesight changes, numbness or lack of strength to your arms, legs, face, or body, seizures or coma. If you start to have any of these symptoms let your doctor know right away. . Tumor lysis syndrome: This drug may act on the cancer cells very quickly. This may affect how your kidneys work and rarely be life-threatening. . A baby exposed to obinutuzumab in pregnancy may have decreased white blood cells Note: Some of the side effects above are very rare. If you have concerns and/or questions, please discuss them with your medical team.  Important Information . Talk to your doctor before receiving any vaccinations during and while you are recovering from your your treatment. Vaccinations with live or weakened vaccines are not recommended. . This drug may be present in the saliva, tears, sweat, urine, stool, vomit, semen, and vaginal secretions. Talk to your doctor and/or your nurse about the necessary precautions to take during this time.  Treating Side Effects . Manage tiredness by pacing your activities for the day . Be sure to include periods of rest between energy-draining activities . To decrease infection, wash your hands regularly . Avoid close contact with people who have a cold, the flu, or other infections . Take your temperature as your doctor or nurse tells you, and whenever you feel like you may have a fever . To help decrease bleeding, use a soft toothbrush. Check with your nurse before using dental floss. . Be very careful when using knives or tools . Use an electric shaver  instead of a razor . To help with decreased appetite, eat small, frequent meals . Eat high caloric food such as pudding, ice cream, yogurt and milkshakes . Drink plenty of fluids (a minimum of eight glasses per day is recommended) . If you throw up or have loose bowel movements, you should drink more fluids so that  you do not become dehydrated (lack water in the body from losing too much fluid) . To help with nausea and vomiting, eat small, frequent meals instead of three large meals a day. Choose foods and drinks that are at room temperature. Ask your nurse or doctor about other helpful tips and medicine that is available to help or stop lessen these symptoms. . If you get diarrhea, eat low-fiber foods that are high in protein and calories and avoid foods that can irritate your digestive tracts or lead to cramping . Ask your doctor or nurse about medicines that are available to help stop or lessen constipation and/ or diarrhea . If you are not able to move your bowels, check with your doctor or nurse before you use enemas, laxatives, or suppositories . Infusion reactions may occur after your infusion. If this happens, call 911 for emergency care. . To help with hair loss, wash with a mild shampoo and avoid washing your hair everyday . Avoid rubbing your scalp, pat your hair or scalp dry . Avoid coloring your hair . Limit your use of hair spray, electric curlers, blow dryers, and curling irons . If you are interested in getting a wig, talk to your nurse. You can also call the [facility] at [phone] to find out information about the "Look Good, Feel Better" program close to where you live. It is a free program where women getting chemotherapy can learn about wigs, turbans and scarves as well as makeup techniques and skin and nail care.  Food and Drug Interactions . There are no known interactions of obinotuzumab with food and other medications . Tell your doctor and  pharmacist about all the medicines and dietary supplements (vitamins, minerals, herbs and others) that you are taking at this time. The safety and use of dietary supplements and alternative diets are often not known. Using these might affect your cancer or interfere with your treatment. Until more is known, you should not use dietary supplements or alternative diets without your cancer doctor's help.  When to Call the Doctor Call your doctor or nurse if you have any of these symptoms and/or any new or unusual symptoms: . Fever of 100.5 F (38 C) or higher . Chills . Fatigue that interferes with your daily activities . Feeling dizzy or lightheaded . Chest pain . Easy bleeding or bruising . Confusion and/or agitation . Seizures . Hallucinations . Trouble understanding or speaking . Blurry vision or changes in your eyesight . Numbness or lack of strength to your arms, legs, face, or body . Wheezing or trouble breathing . Coughing up yellow, green, or bloody mucus . Lasting loss of appetite or rapid weight loss of five pounds in a week . Loose bowel movements (diarrhea) 4 times or loose bowel movements with lack of strength or a feeling of being dizzy . No bowel movement in 3 days or when you feel uncomfortable. . Nausea that stops you from eating or drinking and/or is not relieved by prescribed medicines . Throwing up more than 3 times a day . Pain or burning when you pass urine . Difficulty urinating . Feeling like you have to pass urine often, but not much comes out when you do. . Tender or heavy feeling in your lower abdomen. . Cloudy urine and/or urine that smells bad. . Pain on one side of your back under your ribs. This is where your kidneys are . Signs of tumor lysis: Confusion or agitation, decreased urine, nausea/vomiting, diarrhea, muscle  cramping, numbness and/or tingling, seizures. . Signs of infusion reaction: fever or shaking chills, flushing, facial swelling, feeling  dizzy, headache, trouble breathing, rash, itching, chest tightness, or chest pain. . Signs of possible liver problems: dark urine, pale bowel movements, bad stomach pain, feeling very tired and weak, unusual itching, or yellowing of the eyes or skin . If you think you may be pregnant  Reproduction Warnings . Pregnancy warning: It is not known if this drug may harm an unborn child. For this reason, be sure to talk with your doctor if you are pregnant or planning to become pregnant while receiving this drug. Let your doctor know right away if you think you may be pregnant. . Breastfeeding warning: It is not known if this drug passes into breast milk. For this reason, women should talk to their doctor about the risks and benefits of breast feeding during treatment with this drug because this drug may enter the breast milk and cause harm to a breast feeding baby. . Fertility warning: Human fertility studies have not been done with this drug. Talk with your doctor or nurse if you plan to have children. Ask for information on sperm or egg banking.     SELF CARE ACTIVITIES WHILE ON CHEMOTHERAPY: Hydration Increase your fluid intake 48 hours prior to treatment and drink at least 8 to 12 cups (64 ounces) of water/decaff beverages per day after treatment. You can still have your cup of coffee or soda but these beverages do not count as part of your 8 to 12 cups that you need to drink daily. No alcohol intake.  Medications Continue taking your normal prescription medication as prescribed.  If you start any new herbal or new supplements please let us know first to make sure it is safe.  Mouth Care Have teeth cleaned professionally before starting treatment. Keep dentures and partial plates clean. Use soft toothbrush and do not use mouthwashes that contain alcohol. Biotene is a good mouthwash that is available at most pharmacies or may be ordered by calling [phone]. Use warm salt water gargles  (1 teaspoon salt per 1 quart warm water) before and after meals and at bedtime. Or you may rinse with 2 tablespoons of three-percent hydrogen peroxide mixed in eight ounces of water. If you are still having problems with your mouth or sores in your mouth please call the clinic. If you need dental work, please let the doctor know before you go for your appointment so that we can coordinate the best possible time for you in regards to your chemo regimen. You need to also let your dentist know that you are actively taking chemo. We may need to do labs prior to your dental appointment.   Skin Care Always use sunscreen that has not expired and with SPF (Sun Protection Factor) of 50 or higher. Wear hats to protect your head from the sun. Remember to use sunscreen on your hands, ears, face, & feet.  Use good moisturizing lotions such as udder cream, eucerin, or even Vaseline. Some chemotherapies can cause dry skin, color changes in your skin and nails.    . Avoid long, hot showers or baths. . Use gentle, fragrance-free soaps and laundry detergent. . Use moisturizers, preferably creams or ointments rather than lotions because the thicker consistency is better at preventing skin dehydration. Apply the cream or ointment within 15 minutes of showering. Reapply moisturizer at night, and moisturize your hands every time after you wash them.  Hair Loss (if your  doctor says your hair will fall out)  . If your doctor says that your hair is likely to fall out, decide before you begin chemo whether you want to wear a wig. You may want to shop before treatment to match your hair color. . Hats, turbans, and scarves can also camouflage hair loss, although some people prefer to leave their heads uncovered. If you go bare-headed outdoors, be sure to use sunscreen on your scalp. . Cut your hair short. It eases the inconvenience of shedding lots of hair, but it also can reduce the emotional impact of watching your hair fall  out. . Don't perm or color your hair during chemotherapy. Those chemical treatments are already damaging to hair and can enhance hair loss. Once your chemo treatments are done and your hair has grown back, it's OK to resume dyeing or perming hair. With chemotherapy, hair loss is almost always temporary. But when it grows back, it may be a different color or texture. In older adults who still had hair color before chemotherapy, the new growth may be completely gray.  Often, new hair is very fine and soft.  Infection Prevention Please wash your hands for at least 30 seconds using warm soapy water. Handwashing is the #1 way to prevent the spread of germs. Stay away from sick people or people who are getting over a cold. If you develop respiratory systems such as green/yellow mucus production or productive cough or persistent cough let us know and we will see if you need an antibiotic. It is a good idea to keep a pair of gloves on when going into grocery stores/[employer] to decrease your risk of coming into contact with germs on the carts, etc. Carry alcohol hand gel with you at all times and use it frequently if out in public. If your temperature reaches 100.5 or higher please call the clinic and let us know.  If it is after hours or on the weekend please go to the ER if your temperature is over 100.5.  Please have your own personal thermometer at home to use.    Sex and bodily fluids If you are going to have sex, a condom must be used to protect the person that isn't taking chemotherapy. Chemo can decrease your libido (sex drive). For a few days after chemotherapy, chemotherapy can be excreted through your bodily fluids.  When using the toilet please close the lid and flush the toilet twice.  Do this for a few day after you have had chemotherapy.    Effects of chemotherapy on your sex life Some changes are simple and won't last long. They won't affect your sex life permanently. Sometimes you may  feel: . too tired . not strong enough to be very active . sick or sore  . not in the mood . anxious or low Your anxiety might not seem related to sex. For example, you may be worried about the cancer and how your treatment is going. Or you may be worried about money, or about how you family are coping with your illness. These things can cause stress, which can affect your interest in sex. It's important to talk to your partner about how you feel. Remember - the changes to your sex life don't usually last long. There's usually no medical reason to stop having sex during chemo. The drugs won't have any long term physical effects on your performance or enjoyment of sex. Cancer can't be passed on to your partner during sex  Contraception It's important to use reliable contraception during treatment. Avoid getting pregnant while you or your partner are having chemotherapy. This is because the drugs may harm the baby. Sometimes chemotherapy drugs can leave a man or woman infertile.  This means you would not be able to have children in the future. You might want to talk to someone about permanent infertility. It can be very difficult to learn that you may no longer be able to have children. Some people find counselling helpful. There might be ways to preserve your fertility, although this is easier for men than for women. You may want to speak to a fertility expert. You can talk about sperm banking or harvesting your eggs. You can also ask about other fertility options, such as donor eggs. If you have or have had breast cancer, your doctor might advise you not to take the contraceptive pill. This is because the hormones in it might affect the cancer.  It is not known for sure whether or not chemotherapy drugs can be passed on through semen or secretions from the vagina. Because of this some doctors advise people to use a barrier method if you have sex during treatment. This applies to vaginal, anal or oral  sex. Generally, doctors advise a barrier method only for the time you are actually having the treatment and for about a week after your treatment. Advice like this can be worrying, but this does not mean that you have to avoid being intimate with your partner. You can still have close contact with your partner and continue to enjoy sex.  Animals If you have cats or birds we just ask that you not change the litter or change the cage.  Please have someone else do this for you while you are on chemotherapy.   Food Safety During and After Cancer Treatment Food safety is important for people both during and after cancer treatment. Cancer and cancer treatments, such as chemotherapy, radiation therapy, and stem cell/bone marrow transplantation, often weaken the immune system. This makes it harder for your body to protect itself from foodborne illness, also called food poisoning. Foodborne illness is caused by eating food that contains harmful bacteria, parasites, or viruses.  Foods to avoid Some foods have a higher risk of becoming tainted with bacteria. These include: [name] Unwashed fresh fruit and vegetables, especially leafy vegetables that can hide dirt and other contaminants . Raw sprouts, such as alfalfa sprouts . Raw or undercooked beef, especially ground beef, or other raw or undercooked meat and poultry . Fatty, fried, or spicy foods immediately before or after treatment.  These can sit heavy on your stomach and make you feel nauseous. . Raw or undercooked shellfish, such as oysters. . Sushi and sashimi, which often contain raw fish.  . Unpasteurized beverages, such as unpasteurized fruit juices, raw milk, raw yogurt, or cider . Undercooked eggs, such as soft boiled, over easy, and poached; raw, unpasteurized eggs; or foods made with raw egg, such as homemade raw cookie dough and homemade mayonnaise Simple steps for food safety Shop smart. . Do not buy food stored or displayed in an unclean  area. . Do not buy bruised or damaged fruits or vegetables. . Do not buy cans that have cracks, dents, or bulges. . Pick up foods that can spoil at the end of your shopping trip and store them in a cooler on the way home. Prepare and clean up foods carefully. . Rinse all fresh fruits and vegetables under running water,  and dry them with a clean towel or paper towel. . Clean the top of cans before opening them. . After preparing food, wash your hands for 20 seconds with hot water and soap. Pay special attention to areas between fingers and under nails. . Clean your utensils and dishes with hot water and soap. [name] Disinfect your kitchen and cutting boards using 1 teaspoon of liquid, unscented bleach mixed into 1 quart of water.   Dispose of old food. . Eat canned and packaged food before its expiration date (the "use by" or "best before" date). . Consume refrigerated leftovers within 3 to 4 days. After that time, throw out the food. Even if the food does not smell or look spoiled, it still may be unsafe. Some bacteria, such as Listeria, can grow even on foods stored in the refrigerator if they are kept for too long. Take precautions when eating out. . At restaurants, avoid buffets and salad bars where food sits out for a long time and comes in contact with many people. Food can become contaminated when someone with a virus, often a norovirus, or another "bug" handles it. . Put any leftover food in a "to-go" container yourself, rather than having the server do it. And, refrigerate leftovers as soon as you get home. . Choose restaurants that are clean and that are willing to prepare your food as you order it cooked.    MEDICATIONS: Allopurinol 300 mg tablet:  TAKE 1 TABLET (400 MG TOTAL) BY MOUTH DAILY.  Acyclovir 400 mg tablet: Take 1 tablet (300 mg total) by mouth daily.  Bactrim DS 800 mg-160 mg tablet:  Take 1 tablet by mouth 3 (three) times a week.  Take mon-wed-fridays.  Zofran/Ondansetron  8mg  tablet. Take 1 tablet every 8 hours as needed for nausea/vomiting. (#1 nausea med to take, this can constipate)  Compazine/Prochlorperazine 10mg  tablet. Take 1 tablet every 6 hours as needed for nausea/vomiting. (#2 nausea med to take, this can make you sleepy)  EMLA cream:  Apply a quarter size amount to affected area 1 hour prior to coming to chemotherapy. Do not rub in.  Cover with plastic wrap.   Over-the-Counter Meds:  Miralax 1 capful in 8 oz of fluid daily. May increase to two times a day if needed. This is a stool softener. If this doesn't work proceed you can add:  Senokot S-start with 1 tablet two times a day and increase to 4 tablets two times a day if needed. (total of 8 tablets in a 24 hour period). This is a stimulant laxative.   Call us if this does not help your bowels move.   Imodium 2mg  capsule. Take 2 capsules after the 1st loose stool and then 1 capsule every 2 hours until you go a total of 12 hours without having a loose stool. Call the [facility] if loose stools continue. If diarrhea occurs @ bedtime, take 2 capsules @ bedtime. Then take 2 capsules every 4 hours until morning. Call [facility].    Diarrhea Sheet  If you are having loose stools/diarrhea, please purchase Imodium and begin taking as outlined:  At the first sign of poorly formed or loose stools you should begin taking Imodium(loperamide) 2 mg capsules.  Take two caplets (4mg ) followed by one caplet (2mg ) every 2 hours until you have had no diarrhea for 12 hours.  During the night take two caplets (4mg ) at bedtime and continue every 4 hours during the night until the morning.  Stop taking Imodium  only after there is no sign of diarrhea for 12 hours.    Always call the [facility] if you are having loose stools/diarrhea that you can't get under control.  Loose stools/disrrhea leads to dehydration (loss of water) in your body.  We have other options of trying to get the loose stools/diarrhea to  stopped but you must let us know!    Nausea Sheet  Zofran/Ondansetron 8mg  tablet. Take 1 tablet every 8 hours as needed for nausea/vomiting. (#1 nausea med to take, this can constipate)  Compazine/Prochlorperazine 10mg  tablet. Take 1 tablet every 6 hours as needed for nausea/vomiting. (#2 nausea med to take, this can make you sleepy)  You can take these medications together or separately.  We would first like for you to try the Ondansetron by itself and then take the Prochloperizine if needed. But you are allowed to take both medications at the same time if your nausea is that severe.  If you are having persistent nausea (nausea that does not stop) please take these medications on a staggered schedule so that the nausea medication stays in your body.  Please call the [facility] and let us know the amount of nausea that you are experiencing.  If you begin to vomit, you need to call the [facility] and if it is the weekend and you have vomited more than one time and cant get it to stop-go to the Emergency Room.  Persistent nausea/vomiting can lead to dehydration (loss of fluid in your body) and will make you feel terrible.   Ice chips, sips of clear liquids, foods that are @ room temperature, crackers, and toast tend to be better tolerated.    Constipation Sheet *Miralax in 8 oz of fluid daily.  May increase to two times a day if needed.  This is a stool softener.  If this not enough to keep your bowel regular:  You can add:  *Senokot S, start with one tablet twice a day and can increase to 4 tablets twice a day if needed.  This is a stimulant laxative.   Sometimes when you take pain medication you need BOTH a medicine to keep your stool soft and a medicine to help your bowel push it out!  Please call if the above does not work for you.   Do not go more than 2 days without a bowel movement.  It is very important that you do not become constipated.  It will make you feel sick to your  stomach (nausea) and can cause abdominal pain and vomiting.    SYMPTOMS TO REPORT AS SOON AS POSSIBLE AFTER TREATMENT:  FEVER GREATER THAN 100.5 F  CHILLS WITH OR WITHOUT FEVER  NAUSEA AND VOMITING THAT IS NOT CONTROLLED WITH YOUR NAUSEA MEDICATION  UNUSUAL SHORTNESS OF BREATH  UNUSUAL BRUISING OR BLEEDING  TENDERNESS IN MOUTH AND THROAT WITH OR WITHOUT PRESENCE OF ULCERS  URINARY PROBLEMS  BOWEL PROBLEMS  UNUSUAL RASH    Wear comfortable clothing and clothing appropriate for easy access to any Portacath or PICC line. Let us know if there is anything that we can do to make your therapy better!    What to do if you need assistance after hours or on the weekends: CALL [phone].  HOLD on the line, do not hang up.  You will hear multiple messages but at the end you will be connected with a nurse triage line.  They will contact the doctor if necessary.  Most of the time they will  be able to assist you.  Do not call the hospital operator.      I have been informed and understand all of the instructions given to me and have received a copy. I have been instructed to call the clinic [phone] or my family physician as soon as possible for continued medical care, if indicated. I do not have any more questions at this time but understand that I may call the [facility] or the Patient Navigator at [phone] during office hours should I have questions or need assistance in obtaining follow-up care.

## 2017-01-24 ENCOUNTER — Ambulatory Visit (HOSPITAL_COMMUNITY): Payer: Medicare Other

## 2017-01-24 ENCOUNTER — Other Ambulatory Visit (HOSPITAL_COMMUNITY): Payer: Medicare Other

## 2017-01-24 ENCOUNTER — Encounter (HOSPITAL_COMMUNITY): Payer: Medicare Other

## 2017-01-24 DIAGNOSIS — C9110 Chronic lymphocytic leukemia of B-cell type not having achieved remission: Secondary | ICD-10-CM

## 2017-01-24 NOTE — Progress Notes (Signed)
Chemotherapy teaching completed.  Consent signed.  Extensive teaching packet given.   

## 2017-01-25 ENCOUNTER — Other Ambulatory Visit (HOSPITAL_COMMUNITY): Payer: Medicare Other

## 2017-01-25 ENCOUNTER — Ambulatory Visit (HOSPITAL_COMMUNITY): Payer: Medicare Other

## 2017-01-28 ENCOUNTER — Other Ambulatory Visit (HOSPITAL_COMMUNITY): Payer: Medicare Other

## 2017-01-28 ENCOUNTER — Other Ambulatory Visit: Payer: Self-pay

## 2017-01-28 ENCOUNTER — Encounter (HOSPITAL_COMMUNITY): Payer: Self-pay

## 2017-01-28 ENCOUNTER — Encounter (HOSPITAL_COMMUNITY): Payer: Self-pay | Admitting: *Deleted

## 2017-01-28 ENCOUNTER — Encounter (HOSPITAL_BASED_OUTPATIENT_CLINIC_OR_DEPARTMENT_OTHER): Payer: Medicare Other

## 2017-01-28 ENCOUNTER — Encounter (HOSPITAL_COMMUNITY): Payer: Medicare Other

## 2017-01-28 ENCOUNTER — Emergency Department (HOSPITAL_COMMUNITY): Payer: Medicare Other

## 2017-01-28 ENCOUNTER — Inpatient Hospital Stay (HOSPITAL_COMMUNITY)
Admission: EM | Admit: 2017-01-28 | Discharge: 2017-02-08 | DRG: 242 | Disposition: A | Discharge status: Home Health | Payer: Medicare Other | Attending: Family Medicine | Admitting: Family Medicine

## 2017-01-28 VITALS — BP 84/40 | HR 102 | Temp 99.3°F | Resp 16 | Wt 140.3 lb

## 2017-01-28 DIAGNOSIS — J9601 Acute respiratory failure with hypoxia: Secondary | ICD-10-CM | POA: Diagnosis not present

## 2017-01-28 DIAGNOSIS — I442 Atrioventricular block, complete: Principal | ICD-10-CM | POA: Diagnosis present

## 2017-01-28 DIAGNOSIS — E875 Hyperkalemia: Secondary | ICD-10-CM | POA: Diagnosis present

## 2017-01-28 DIAGNOSIS — E440 Moderate protein-calorie malnutrition: Secondary | ICD-10-CM | POA: Diagnosis not present

## 2017-01-28 DIAGNOSIS — E1165 Type 2 diabetes mellitus with hyperglycemia: Secondary | ICD-10-CM | POA: Diagnosis not present

## 2017-01-28 DIAGNOSIS — I4891 Unspecified atrial fibrillation: Secondary | ICD-10-CM | POA: Diagnosis not present

## 2017-01-28 DIAGNOSIS — Z9841 Cataract extraction status, right eye: Secondary | ICD-10-CM

## 2017-01-28 DIAGNOSIS — Z95818 Presence of other cardiac implants and grafts: Secondary | ICD-10-CM

## 2017-01-28 DIAGNOSIS — E872 Acidosis, unspecified: Secondary | ICD-10-CM

## 2017-01-28 DIAGNOSIS — R55 Syncope and collapse: Secondary | ICD-10-CM | POA: Diagnosis present

## 2017-01-28 DIAGNOSIS — D61818 Other pancytopenia: Secondary | ICD-10-CM | POA: Diagnosis present

## 2017-01-28 DIAGNOSIS — Z87891 Personal history of nicotine dependence: Secondary | ICD-10-CM | POA: Diagnosis not present

## 2017-01-28 DIAGNOSIS — I482 Chronic atrial fibrillation: Secondary | ICD-10-CM | POA: Diagnosis not present

## 2017-01-28 DIAGNOSIS — J90 Pleural effusion, not elsewhere classified: Secondary | ICD-10-CM | POA: Diagnosis not present

## 2017-01-28 DIAGNOSIS — R0600 Dyspnea, unspecified: Secondary | ICD-10-CM | POA: Diagnosis not present

## 2017-01-28 DIAGNOSIS — R0781 Pleurodynia: Secondary | ICD-10-CM | POA: Diagnosis not present

## 2017-01-28 DIAGNOSIS — Z9221 Personal history of antineoplastic chemotherapy: Secondary | ICD-10-CM | POA: Diagnosis not present

## 2017-01-28 DIAGNOSIS — I130 Hypertensive heart and chronic kidney disease with heart failure and stage 1 through stage 4 chronic kidney disease, or unspecified chronic kidney disease: Secondary | ICD-10-CM | POA: Diagnosis not present

## 2017-01-28 DIAGNOSIS — E1122 Type 2 diabetes mellitus with diabetic chronic kidney disease: Secondary | ICD-10-CM | POA: Diagnosis not present

## 2017-01-28 DIAGNOSIS — R404 Transient alteration of awareness: Secondary | ICD-10-CM | POA: Diagnosis not present

## 2017-01-28 DIAGNOSIS — G9340 Encephalopathy, unspecified: Secondary | ICD-10-CM | POA: Diagnosis not present

## 2017-01-28 DIAGNOSIS — D630 Anemia in neoplastic disease: Secondary | ICD-10-CM | POA: Diagnosis present

## 2017-01-28 DIAGNOSIS — Z6823 Body mass index (BMI) 23.0-23.9, adult: Secondary | ICD-10-CM

## 2017-01-28 DIAGNOSIS — I4892 Unspecified atrial flutter: Secondary | ICD-10-CM | POA: Diagnosis not present

## 2017-01-28 DIAGNOSIS — J189 Pneumonia, unspecified organism: Secondary | ICD-10-CM | POA: Diagnosis present

## 2017-01-28 DIAGNOSIS — E883 Tumor lysis syndrome: Secondary | ICD-10-CM | POA: Diagnosis not present

## 2017-01-28 DIAGNOSIS — J9690 Respiratory failure, unspecified, unspecified whether with hypoxia or hypercapnia: Secondary | ICD-10-CM | POA: Diagnosis not present

## 2017-01-28 DIAGNOSIS — Z79899 Other long term (current) drug therapy: Secondary | ICD-10-CM | POA: Diagnosis not present

## 2017-01-28 DIAGNOSIS — D6959 Other secondary thrombocytopenia: Secondary | ICD-10-CM | POA: Diagnosis present

## 2017-01-28 DIAGNOSIS — Z8674 Personal history of sudden cardiac arrest: Secondary | ICD-10-CM | POA: Diagnosis not present

## 2017-01-28 DIAGNOSIS — Z0189 Encounter for other specified special examinations: Secondary | ICD-10-CM | POA: Diagnosis not present

## 2017-01-28 DIAGNOSIS — I459 Conduction disorder, unspecified: Secondary | ICD-10-CM | POA: Diagnosis not present

## 2017-01-28 DIAGNOSIS — C9110 Chronic lymphocytic leukemia of B-cell type not having achieved remission: Secondary | ICD-10-CM

## 2017-01-28 DIAGNOSIS — E874 Mixed disorder of acid-base balance: Secondary | ICD-10-CM | POA: Diagnosis present

## 2017-01-28 DIAGNOSIS — R579 Shock, unspecified: Secondary | ICD-10-CM | POA: Diagnosis present

## 2017-01-28 DIAGNOSIS — J9600 Acute respiratory failure, unspecified whether with hypoxia or hypercapnia: Secondary | ICD-10-CM

## 2017-01-28 DIAGNOSIS — Y95 Nosocomial condition: Secondary | ICD-10-CM | POA: Diagnosis present

## 2017-01-28 DIAGNOSIS — I469 Cardiac arrest, cause unspecified: Secondary | ICD-10-CM | POA: Diagnosis not present

## 2017-01-28 DIAGNOSIS — Z452 Encounter for adjustment and management of vascular access device: Secondary | ICD-10-CM

## 2017-01-28 DIAGNOSIS — Z950 Presence of cardiac pacemaker: Secondary | ICD-10-CM | POA: Diagnosis not present

## 2017-01-28 DIAGNOSIS — N183 Chronic kidney disease, stage 3 (moderate): Secondary | ICD-10-CM | POA: Diagnosis present

## 2017-01-28 DIAGNOSIS — I509 Heart failure, unspecified: Secondary | ICD-10-CM | POA: Diagnosis present

## 2017-01-28 DIAGNOSIS — I340 Nonrheumatic mitral (valve) insufficiency: Secondary | ICD-10-CM | POA: Diagnosis not present

## 2017-01-28 DIAGNOSIS — I483 Typical atrial flutter: Secondary | ICD-10-CM | POA: Diagnosis not present

## 2017-01-28 DIAGNOSIS — R001 Bradycardia, unspecified: Secondary | ICD-10-CM

## 2017-01-28 DIAGNOSIS — D649 Anemia, unspecified: Secondary | ICD-10-CM

## 2017-01-28 DIAGNOSIS — I351 Nonrheumatic aortic (valve) insufficiency: Secondary | ICD-10-CM | POA: Diagnosis present

## 2017-01-28 DIAGNOSIS — I499 Cardiac arrhythmia, unspecified: Secondary | ICD-10-CM | POA: Diagnosis not present

## 2017-01-28 DIAGNOSIS — I371 Nonrheumatic pulmonary valve insufficiency: Secondary | ICD-10-CM | POA: Diagnosis not present

## 2017-01-28 DIAGNOSIS — E876 Hypokalemia: Secondary | ICD-10-CM | POA: Diagnosis not present

## 2017-01-28 DIAGNOSIS — Z886 Allergy status to analgesic agent status: Secondary | ICD-10-CM

## 2017-01-28 DIAGNOSIS — Z5112 Encounter for antineoplastic immunotherapy: Secondary | ICD-10-CM | POA: Diagnosis not present

## 2017-01-28 DIAGNOSIS — T451X5A Adverse effect of antineoplastic and immunosuppressive drugs, initial encounter: Secondary | ICD-10-CM | POA: Diagnosis present

## 2017-01-28 DIAGNOSIS — Z961 Presence of intraocular lens: Secondary | ICD-10-CM | POA: Diagnosis present

## 2017-01-28 DIAGNOSIS — R739 Hyperglycemia, unspecified: Secondary | ICD-10-CM | POA: Diagnosis not present

## 2017-01-28 DIAGNOSIS — N179 Acute kidney failure, unspecified: Secondary | ICD-10-CM | POA: Diagnosis not present

## 2017-01-28 DIAGNOSIS — Z9842 Cataract extraction status, left eye: Secondary | ICD-10-CM

## 2017-01-28 LAB — COMPREHENSIVE METABOLIC PANEL
ALBUMIN: 3.8 g/dL (ref 3.5–5.0)
ALT: 12 U/L — AB (ref 17–63)
ANION GAP: 9 (ref 5–15)
AST: 21 U/L (ref 15–41)
Alkaline Phosphatase: 112 U/L (ref 38–126)
BUN: 31 mg/dL — ABNORMAL HIGH (ref 6–20)
CHLORIDE: 102 mmol/L (ref 101–111)
CO2: 26 mmol/L (ref 22–32)
CREATININE: 2.66 mg/dL — AB (ref 0.61–1.24)
Calcium: 8.6 mg/dL — ABNORMAL LOW (ref 8.9–10.3)
GFR calc non Af Amer: 21 mL/min — ABNORMAL LOW (ref >60)
GFR, EST AFRICAN AMERICAN: 25 mL/min — AB (ref >60)
GLUCOSE: 154 mg/dL — AB (ref 65–99)
Potassium: 4.8 mmol/L (ref 3.5–5.1)
SODIUM: 137 mmol/L (ref 135–145)
Total Bilirubin: 0.5 mg/dL (ref 0.3–1.2)
Total Protein: 7 g/dL (ref 6.5–8.1)

## 2017-01-28 LAB — CBC WITH DIFFERENTIAL/PLATELET
Band Neutrophils: 0 %
Basophils Absolute: 0 10*3/uL (ref 0.0–0.1)
Basophils Relative: 0 %
Blasts: 0 %
EOS PCT: 0 %
Eosinophils Absolute: 0 10*3/uL (ref 0.0–0.7)
HEMATOCRIT: 27.3 % — AB (ref 39.0–52.0)
Hemoglobin: 8.8 g/dL — ABNORMAL LOW (ref 13.0–17.0)
LYMPHS ABS: 223.1 10*3/uL — AB (ref 0.7–4.0)
Lymphocytes Relative: 98 %
MCH: 37.6 pg — AB (ref 26.0–34.0)
MCHC: 32.2 g/dL (ref 30.0–36.0)
MCV: 116.7 fL — ABNORMAL HIGH (ref 78.0–100.0)
MONOS PCT: 0 %
Metamyelocytes Relative: 0 %
Monocytes Absolute: 0 10*3/uL — ABNORMAL LOW (ref 0.1–1.0)
Myelocytes: 0 %
NEUTROS ABS: 4.6 10*3/uL (ref 1.7–7.7)
NEUTROS PCT: 2 %
NRBC: 0 /100{WBCs}
Other: 0 %
PLATELETS: 123 10*3/uL — AB (ref 150–400)
Promyelocytes Absolute: 0 %
RBC: 2.34 MIL/uL — AB (ref 4.22–5.81)
RDW: 15.7 % — ABNORMAL HIGH (ref 11.5–15.5)
WBC: 227.7 10*3/uL — AB (ref 4.0–10.5)

## 2017-01-28 LAB — BASIC METABOLIC PANEL
Anion gap: 21 — ABNORMAL HIGH (ref 5–15)
BUN: 38 mg/dL — ABNORMAL HIGH (ref 6–20)
CHLORIDE: 108 mmol/L (ref 101–111)
CO2: 9 mmol/L — AB (ref 22–32)
CREATININE: 3.34 mg/dL — AB (ref 0.61–1.24)
Calcium: 7.7 mg/dL — ABNORMAL LOW (ref 8.9–10.3)
GFR calc non Af Amer: 16 mL/min — ABNORMAL LOW (ref >60)
GFR, EST AFRICAN AMERICAN: 19 mL/min — AB (ref >60)
Glucose, Bld: 360 mg/dL — ABNORMAL HIGH (ref 65–99)
Potassium: 6 mmol/L — ABNORMAL HIGH (ref 3.5–5.1)
Sodium: 138 mmol/L (ref 135–145)

## 2017-01-28 LAB — URINALYSIS, ROUTINE W REFLEX MICROSCOPIC
Bilirubin Urine: NEGATIVE
Glucose, UA: NEGATIVE mg/dL
Hgb urine dipstick: NEGATIVE
Ketones, ur: NEGATIVE mg/dL
Leukocytes, UA: NEGATIVE
Nitrite: NEGATIVE
PROTEIN: 100 mg/dL — AB
RBC / HPF: NONE SEEN RBC/hpf (ref 0–5)
Specific Gravity, Urine: 1.006 (ref 1.005–1.030)
pH: 6 (ref 5.0–8.0)

## 2017-01-28 LAB — CBC
HEMATOCRIT: 26.3 % — AB (ref 39.0–52.0)
HEMOGLOBIN: 7.9 g/dL — AB (ref 13.0–17.0)
MCH: 36.9 pg — ABNORMAL HIGH (ref 26.0–34.0)
MCHC: 30 g/dL (ref 30.0–36.0)
MCV: 122.9 fL — AB (ref 78.0–100.0)
PLATELETS: 98 10*3/uL — AB (ref 150–400)
RBC: 2.14 MIL/uL — AB (ref 4.22–5.81)
RDW: 16.3 % — ABNORMAL HIGH (ref 11.5–15.5)
WBC: 149.9 10*3/uL (ref 4.0–10.5)

## 2017-01-28 LAB — MAGNESIUM
MAGNESIUM: 2.2 mg/dL (ref 1.7–2.4)
Magnesium: 2.4 mg/dL (ref 1.7–2.4)

## 2017-01-28 LAB — URIC ACID
Uric Acid, Serum: 2.5 mg/dL — ABNORMAL LOW (ref 4.4–7.6)
Uric Acid, Serum: <0.5 mg/dL — ABNORMAL LOW (ref 4.4–7.6)

## 2017-01-28 LAB — LACTATE DEHYDROGENASE: LDH: 223 U/L — ABNORMAL HIGH (ref 98–192)

## 2017-01-28 LAB — LACTIC ACID, PLASMA: Lactic Acid, Venous: 9.2 mmol/L (ref 0.5–1.9)

## 2017-01-28 LAB — TROPONIN I: TROPONIN I: 0.03 ng/mL — AB (ref <0.03)

## 2017-01-28 LAB — PHOSPHORUS
Phosphorus: 3.3 mg/dL (ref 2.5–4.6)
Phosphorus: 7.6 mg/dL — ABNORMAL HIGH (ref 2.5–4.6)

## 2017-01-28 MED ORDER — ACETAMINOPHEN 325 MG PO TABS
650.0000 mg | ORAL_TABLET | Freq: Once | ORAL | Status: AC
  Administered 2017-01-28: 650 mg via ORAL

## 2017-01-28 MED ORDER — DEXTROSE 50 % IV SOLN
1.0000 | Freq: Once | INTRAVENOUS | Status: AC
  Administered 2017-01-28: 50 mL via INTRAVENOUS

## 2017-01-28 MED ORDER — SODIUM CHLORIDE 0.9 % IV SOLN
6.0000 mg | Freq: Once | INTRAVENOUS | Status: DC
  Filled 2017-01-28: qty 4

## 2017-01-28 MED ORDER — SODIUM CHLORIDE 0.9 % IV BOLUS (SEPSIS)
1000.0000 mL | Freq: Once | INTRAVENOUS | Status: AC
  Administered 2017-01-28: 1000 mL via INTRAVENOUS

## 2017-01-28 MED ORDER — EPINEPHRINE PF 1 MG/10ML IJ SOSY
PREFILLED_SYRINGE | INTRAMUSCULAR | Status: AC | PRN
  Administered 2017-01-28: 1 mg via INTRAVENOUS

## 2017-01-28 MED ORDER — SODIUM CHLORIDE 0.9 % IV SOLN
20.0000 mg | Freq: Once | INTRAVENOUS | Status: AC
  Administered 2017-01-28: 20 mg via INTRAVENOUS
  Filled 2017-01-28: qty 2

## 2017-01-28 MED ORDER — SODIUM CHLORIDE 0.9% FLUSH
10.0000 mL | INTRAVENOUS | Status: DC | PRN
  Administered 2017-01-28: 10 mL
  Filled 2017-01-28: qty 10

## 2017-01-28 MED ORDER — STERILE WATER FOR INJECTION IV SOLN
INTRAVENOUS | Status: DC
  Administered 2017-01-29: 01:00:00 via INTRAVENOUS
  Filled 2017-01-28 (×2): qty 850

## 2017-01-28 MED ORDER — SODIUM CHLORIDE 0.9 % IV SOLN
1.0000 g | Freq: Once | INTRAVENOUS | Status: DC
  Filled 2017-01-28: qty 10

## 2017-01-28 MED ORDER — FUROSEMIDE 10 MG/ML IJ SOLN
40.0000 mg | Freq: Once | INTRAMUSCULAR | Status: AC
  Administered 2017-01-28: 40 mg via INTRAVENOUS
  Filled 2017-01-28: qty 4

## 2017-01-28 MED ORDER — SODIUM CHLORIDE 0.9 % IV SOLN
INTRAVENOUS | Status: DC
  Administered 2017-01-28: 09:00:00 via INTRAVENOUS

## 2017-01-28 MED ORDER — DOPAMINE-DEXTROSE 3.2-5 MG/ML-% IV SOLN
5.0000 ug/kg/min | Freq: Once | INTRAVENOUS | Status: AC
  Administered 2017-01-28: 5 ug/kg/min via INTRAVENOUS
  Filled 2017-01-28: qty 250

## 2017-01-28 MED ORDER — CALCIUM GLUCONATE 10 % IV SOLN
1.0000 g | Freq: Once | INTRAVENOUS | Status: AC
Start: 2017-01-28 — End: 2017-01-28
  Administered 2017-01-28: 1 g via INTRAVENOUS

## 2017-01-28 MED ORDER — INSULIN ASPART 100 UNIT/ML IV SOLN
5.0000 [IU] | Freq: Once | INTRAVENOUS | Status: AC
  Administered 2017-01-28: 5 [IU] via INTRAVENOUS

## 2017-01-28 MED ORDER — OBINUTUZUMAB CHEMO INJECTION 1000 MG/40ML
100.0000 mg | Freq: Once | INTRAVENOUS | Status: AC
  Administered 2017-01-28: 100 mg via INTRAVENOUS
  Filled 2017-01-28: qty 4

## 2017-01-28 MED ORDER — MIDAZOLAM HCL 2 MG/2ML IJ SOLN
INTRAMUSCULAR | Status: AC
  Administered 2017-01-29: 4 mg
  Filled 2017-01-28: qty 4

## 2017-01-28 MED ORDER — ATROPINE SULFATE 1 MG/ML IJ SOLN
INTRAMUSCULAR | Status: AC | PRN
  Administered 2017-01-28: 1 mg via INTRAVENOUS

## 2017-01-28 MED ORDER — SODIUM BICARBONATE 8.4 % IV SOLN
50.0000 meq | Freq: Once | INTRAVENOUS | Status: AC
  Administered 2017-01-28: 50 meq via INTRAVENOUS
  Filled 2017-01-28: qty 50

## 2017-01-28 MED ORDER — PANTOPRAZOLE SODIUM 40 MG IV SOLR
40.0000 mg | Freq: Every day | INTRAVENOUS | Status: DC
  Administered 2017-01-29 – 2017-02-03 (×7): 40 mg via INTRAVENOUS
  Filled 2017-01-28 (×7): qty 40

## 2017-01-28 MED ORDER — SODIUM CHLORIDE 0.9 % IV SOLN
3.0000 mg | Freq: Once | INTRAVENOUS | Status: AC
  Administered 2017-01-28: 3 mg via INTRAVENOUS
  Filled 2017-01-28: qty 2

## 2017-01-28 MED ORDER — ACETAMINOPHEN 325 MG PO TABS
ORAL_TABLET | ORAL | Status: AC
  Filled 2017-01-28: qty 2

## 2017-01-28 MED ORDER — DEXTROSE 50 % IV SOLN: 50.0000 mL | Freq: Once | INTRAVENOUS | Status: DC

## 2017-01-28 MED ORDER — DIPHENHYDRAMINE HCL 50 MG/ML IJ SOLN
INTRAMUSCULAR | Status: AC
  Filled 2017-01-28: qty 1

## 2017-01-28 MED ORDER — SODIUM CHLORIDE 0.9 % IV SOLN: 250.0000 mL | INTRAVENOUS | Status: DC | PRN

## 2017-01-28 MED ORDER — SODIUM CHLORIDE 0.9 % IV SOLN
Freq: Once | INTRAVENOUS | Status: AC
  Administered 2017-01-28: 10:00:00 via INTRAVENOUS

## 2017-01-28 MED ORDER — SODIUM CHLORIDE 0.9 % IV SOLN: INTRAVENOUS | Status: DC

## 2017-01-28 MED ORDER — DIPHENHYDRAMINE HCL 50 MG/ML IJ SOLN
50.0000 mg | Freq: Once | INTRAMUSCULAR | Status: AC
  Administered 2017-01-28: 50 mg via INTRAVENOUS

## 2017-01-28 MED ORDER — SODIUM CHLORIDE 0.9 % IV SOLN
8.0000 mg | Freq: Once | INTRAVENOUS | Status: AC
  Administered 2017-01-28: 8 mg via INTRAVENOUS
  Filled 2017-01-28: qty 4

## 2017-01-28 MED ORDER — INSULIN ASPART 100 UNIT/ML IV SOLN: 10.0000 [IU] | Freq: Once | INTRAVENOUS | Status: DC

## 2017-01-28 MED ORDER — CALCIUM CHLORIDE 10 % IV SOLN
1.0000 g | Freq: Once | INTRAVENOUS | Status: DC
  Filled 2017-01-28: qty 10

## 2017-01-28 NOTE — H&P (Signed)
PULMONARY / CRITICAL CARE MEDICINE   Name: Randall Reyes MRN: [id number] DOB: 11-13-1935    ADMISSION DATE:  01/28/2017 CONSULTATION DATE:  01/28/2017  REFERRING MD:  Dr. Dayna Barker   CHIEF COMPLAINT:  Syncope   HISTORY OF PRESENT ILLNESS:   81 year old male with PMH of Anemia, CLL (diagnosed 2006, treated with FCR in 2010 -- only received 3 cycles due to prolonged pancytopenia, 2015 started on Imbruica, Treated with 1 cycle BR 0/06/5850- 7/78/2423 complicated by severe tumor lysis syndrome (despite allopurinol and rasburicase) requiring hospitalization and dialysis), DM, HTN  Presents to ED on 9/17 after syncopal episode at home requiring brief CPR, family reports that patient underwent chemotherapy treatment earlier in the day. Upon arrival to ED patient was noted to be alert, however during stay experienced another episode on unresponsiveness and was noted to be pulseless. After one round of CPR ROSC was achieved. WBC 149.9, K 6.0, Uric Acid <0.5. Patient transferred to [facility] for further management. PCCM asked to admit.    PAST MEDICAL HISTORY :  He  has a past medical history of Anemia; B12 deficiency (12/07/2014); CLL (chronic lymphocytic leukemia) ([place]) (01/24/2011); Diabetes mellitus; DM (diabetes mellitus) ([place]) (01/24/2011); Eczema (01/24/2011); History of pneumonia (04/2014); Hypertension; Leukemia ([place]) (6.22.2012); Melanoma in situ ([place]) (01/24/2011); Mild obesity (01/24/2011); Port catheter in place (05/02/2012); Shingles; and Ulcer.  PAST SURGICAL HISTORY: He  has a past surgical history that includes Hernia repair (2001); bleeding ulcer; Portacath placement (2008); Cataract extraction w/PHACO (05/05/2012); Cataract extraction w/PHACO (05/15/2012); Esophagogastroduodenoscopy (2008); Colonoscopy (N/A, 01/30/2016); and Esophagogastroduodenoscopy (N/A, 01/30/2016).  Allergies  Allergen Reactions  . Aspirin Other (See Comments)    Bleeding ulcers.   . Ibuprofen Other (See  Comments)    Bleeding ulcers.    Current Facility-Administered Medications on File Prior to Encounter  Medication  . 0.9 %  sodium chloride infusion  . heparin lock flush 100 unit/mL  . sodium chloride flush (NS) 0.9 % injection 10 mL  . sodium chloride flush (NS) 0.9 % injection 10 mL   Current Outpatient Prescriptions on File Prior to Encounter  Medication Sig  . acetaminophen (TYLENOL) 500 MG tablet Take 500 mg by mouth at bedtime as needed for mild pain or moderate pain.   [name] acyclovir (ZOVIRAX) 400 MG tablet Take 1 tablet (400 mg total) by mouth daily.  [name] allopurinol (ZYLOPRIM) 300 MG tablet Take 1 tablet (300 mg total) by mouth daily.  . chlorambucil (LEUKERAN) 2 MG tablet Take 32 mg PO (16 tablets) on Day 1 and 15 of each 28 day cycle of chemotherapy.  Give on an empty stomach 1 hour before or 2 hours after meals.  . cholecalciferol (VITAMIN D) 400 units TABS tablet Take 400 Units by mouth daily.  [name] lidocaine-prilocaine (EMLA) cream Apply a quarter size amount to affected area 1 hour prior to coming to chemotherapy.  Do not rub in.  Cover with plastic wrap.  . Obinutuzumab (GAZYVA IV) Inject into the vein as directed.   . ondansetron (ZOFRAN) 8 MG tablet Take 1 tablet (8 mg total) by mouth every 8 (eight) hours as needed for nausea or vomiting.  . prochlorperazine (COMPAZINE) 10 MG tablet Take 1 tablet (10 mg total) by mouth every 6 (six) hours as needed for nausea or vomiting.  . rosuvastatin (CRESTOR) 40 MG tablet Take 40 mg by mouth at bedtime.   . sevelamer carbonate (RENVELA) 800 MG tablet Take 2 tablets (1,600 mg total) by mouth 3 (three) times daily  with meals. (Patient taking differently: Take 800 mg by mouth 3 (three) times daily with meals. )  . sitaGLIPtin (JANUVIA) 100 MG tablet Take 100 mg by mouth daily.  . sodium bicarbonate 650 MG tablet TAKE 1 TABLET BY MOUTH TWICE A DAY AS DIRECTED  . sulfamethoxazole-trimethoprim (BACTRIM DS,SEPTRA DS) 800-160 MG tablet Take 1  tablet by mouth 3 (three) times a week.    FAMILY HISTORY:  His indicated that his mother is deceased. He indicated that his father is deceased. He indicated that the status of his neg hx is unknown.    SOCIAL HISTORY: He  reports that he has quit smoking. He quit smokeless tobacco use about 49 years ago. He reports that he does not drink alcohol or use drugs.  REVIEW OF SYSTEMS:   Unable to review as patient is encephalopathic   SUBJECTIVE:    VITAL SIGNS: BP (!) 93/49   Pulse 66   Temp 98.7 F (37.1 C) (Oral)   Resp 14   Ht 5\' 1"  (1.549 m)   Wt 63.5 kg (140 lb)   SpO2 100%   BMI 26.45 kg/m   HEMODYNAMICS:    VENTILATOR SETTINGS:    INTAKE / OUTPUT: No intake/output data recorded.  PHYSICAL EXAMINATION: General:  Elderly male, no distress  Neuro:  Lethargic, moves extremities  HEENT:  BiPAP in place  Cardiovascular:  Brady, no MRG  Lungs:  Non-labored, no wheeze/crackles  Abdomen:  Non-tender, active bowel sounds  Musculoskeletal:  -edema  Skin:  Warm, dry   LABS:  BMET  Recent Labs Lab 01/28/17 0801 01/28/17 1949  NA 137 138  K 4.8 6.0*  CL 102 108  CO2 26 9*  BUN 31* 38*  CREATININE 2.66* 3.34*  GLUCOSE 154* 360*    Electrolytes  Recent Labs Lab 01/28/17 0801 01/28/17 1949  CALCIUM 8.6* 7.7*  MG 2.2  --   PHOS 3.3  --     CBC  Recent Labs Lab 01/28/17 0801 01/28/17 1949  WBC 227.7* 149.9*  HGB 8.8* 7.9*  HCT 27.3* 26.3*  PLT 123* 98*    Coag's No results for input(s): APTT, INR in the last 168 hours.  Sepsis Markers No results for input(s): LATICACIDVEN, PROCALCITON, O2SATVEN in the last 168 hours.  ABG No results for input(s): PHART, PCO2ART, PO2ART in the last 168 hours.  Liver Enzymes  Recent Labs Lab 01/28/17 0801  AST 21  ALT 12*  ALKPHOS 112  BILITOT 0.5  ALBUMIN 3.8    Cardiac Enzymes  Recent Labs Lab 01/28/17 2011  TROPONINI 0.03*    Glucose No results for input(s): GLUCAP in the last 168  hours.  Imaging Dg Chest Port 1 View  Result Date: 01/28/2017 CLINICAL DATA:  Dyspnea, pt has external pacer pad and c-pap mask EXAM: PORTABLE CHEST 1 VIEW COMPARISON:  Chest x-ray dated 08/24/2014. FINDINGS: Heart size and mediastinal contours are within normal limits given patient's oblique positioning. Pacer pad overlies the heart. Lungs are clear. No pleural effusion or pneumothorax seen. No acute or suspicious osseous finding. IMPRESSION: No active disease.  No evidence of pneumonia or pulmonary edema. Electronically Signed   By: Franki Cabot M.D.   On: 01/28/2017 20:02     STUDIES:  CXR 9/17 > No acute   CULTURES: None.   ANTIBIOTICS: None.   SIGNIFICANT EVENTS: 9/17 > Presents to ED  LINES/TUBES: Right Chest Port >>>>   DISCUSSION: 81 year old male with CLL presents to ED on 9/18 with syncopal event.  K 6.0, WBC 149.9. Suspected to be in Tumor Lysis. Nephrology consulted.   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 (base crt 2.0-2.2) s/p 3L NS in ED  P:   Consult Nephrology for emergent HD  Trend BMP q6h  Bicarb gtt   GASTROINTESTINAL A:   No issues  P:   NPO PPI   HEMATOLOGIC A:   Tumor Lysis Syndrome  Chronic Lymphocytic Leukemia  -Began Obinutuzumab + Chlorambucil 9/17 with Rasburicase  H/O Complications/Poor tolerance with Chemotherapy  P:  Trend CBC  HD as above   INFECTIOUS A:   No issues  P:   Trend WBC and Fever Curve   ENDOCRINE A:   DM   P:   Trend Glucose  SSI   NEUROLOGIC A:   Acute Encephalopathy in setting of sedation  P:   RASS goal: 0/-1 Fentanyl/Versed PRN     FAMILY  - Updates: Family  updated.   - Inter-disciplinary family meet or Palliative Care meeting due by: 02/05/2017    Hayden Pedro, AGACNP-BC [facility]  Pgr: [phone]  PCCM Pgr: [phone]  STAFF NOTE  I, Dr Seward Carol have personally reviewed patient's available data, including medical history, events of note, physical examination and test results as part of my evaluation. I have discussed with resident/NP and other care providers such as pharmacist, RN and RRT.  In addition,  I personally evaluated patient.  81 yr old male with PMHx of CLL most recent chemotherapy (Obinutuzumab + Chlorambucil 9/17) presents from  APED s/p cardiac arrest, hyperkalemic with AKI  Admitted to ICU for Tumor Lysis syndrome. Received infusion of Calcium, IVF started at [facility].  On presentation to [facility] on NIPPV desaturating, somnolent decision made to intubated for airway protection and acute hypoxic respiratory failure.  Tumor Lysis Syndrome Grade 4 (due to life threatening arrhythmia/cardiac arrest), hyperkalemic hyperphosphatemic with acidosis and AKI s/p arrest.  Trialysis placed for renal replacement therapy awaiting call back from [facility].  ABG -7.18/40/133/15.7 on FiO2 100 PEEP 5 TV 170 RR 14 Metabolic acidosis Lactic noted 9.2 Started on Bicarb ggt will increase minute ventilation to help compensate  Rest Per NP whose note is outlined above and that I agree with  The patient is critically ill with multiple organ systems failure and requires high complexity decision making for assessment and support, frequent evaluation and titration of therapies, application of advanced monitoring technologies and extensive interpretation of multiple databases.   Critical Care Time devoted to patient care services described in this note is >45 Minutes. This time reflects time of care of this signee Dr Seward Carol. This critical care time does not reflect procedure time, or teaching time or supervisory time  of NP but could involve care discussion time    Dr. Seward Carol Pulmonary Critical Care Medicine Locums  01/29/2017 1:01 AM

## 2017-01-28 NOTE — Progress Notes (Signed)
Labs reviewed with MD, kidney function noted, hemoglobin noted, WBC 227.7 noted Will add Rasburicase per MD.proceed with treatment.    1140-patient called out to say he felt hot, sweaty on his neck, no SOB, nauseated. Immediately stopped chemo. Started a new bag of saline only. Vitals taken and notified MD. Will start zofran as ordered, and restart chemotherapy after zofran is done per MD.See flowsheet for vitals.  1155-patient feeling little better, zofran started. Patient sitting on side of bed. Wife at bedside.   1226-restarted chemotherapy. Patient states he feels much better. No nausea at this time. BP is low, did a manual BP. MD aware. Continue for now. Will monitor.     1550-patient tolerated the rest of his treatment. Vitals stable and discharged home from clinic ambulatory. Follow up as scheduled tomorrow for day 2.

## 2017-01-28 NOTE — ED Triage Notes (Addendum)
Pt comes in for syncopal episode. He currently has leukemia and got his first treatment today. He went home and wife states she couldn't get him awake. Pt is alert at this time. He is diaphoretic and pale.   CBG 311

## 2017-01-28 NOTE — ED Notes (Signed)
Per Dr Aggie Moats dopamine can remain at current rate of 54mcg/kg/hr.

## 2017-01-28 NOTE — ED Notes (Signed)
Dr zammit at bedside during event and advised that pt is able to be transferred to [place], ems left building with RN riding as third person,

## 2017-01-28 NOTE — Code Documentation (Signed)
Staff noticed that pt had snoring respirations, no pulse noted, cpr started, pt asystole on monitor, Dr Lacinda Axon at bedside,

## 2017-01-28 NOTE — ED Provider Notes (Addendum)
[facility] DEPT Provider Note   CSN: [id number] Arrival date & time: 01/28/17  1858     History   Chief Complaint Chief Complaint  Patient presents with  . Loss of Consciousness    HPI Randall Reyes is a 81 y.o. male.  Level V caveat for urgent need for intervention.   Patient apparently had a syncopal spell at home tonight. EMS was notified. A brief round of chest compressions was performed. Patient then became more alert.  In the emergency department, he had another spell of "passing out" and unresponsvieness. Nurses could not get pulses. CPR was initiated for a brief period of time along with 1 dose of epinephrine, 1 dose of atropine, and external pacing for a brief time. Family reports no previous history of cardiac disease or arrhythmia. He has CLL and received a chemotherapy infusion earlier today.      Past Medical History:  Diagnosis Date  . Anemia   . B12 deficiency 12/07/2014  . CLL (chronic lymphocytic leukemia) ([place]) 01/24/2011  . Diabetes mellitus   . DM (diabetes mellitus) ([place]) 01/24/2011  . Eczema 01/24/2011  . History of pneumonia 04/2014  . Hypertension   . Leukemia ([place]) 6.22.2012   PER PATIENT  . Melanoma in situ ([place]) 01/24/2011  . Mild obesity 01/24/2011  . Port catheter in place 05/02/2012  . Shingles   . Ulcer     Patient Active Problem List   Diagnosis Date Noted  . Bigeminy 05/23/2016  . Goals of care, counseling/discussion 05/22/2016  . Anemia 01/20/2016  . Special screening for malignant neoplasms, colon 01/20/2016  . HTN (hypertension) 02/01/2015  . B12 deficiency 12/07/2014  . Malnutrition of moderate degree ([place]) 08/11/2014  . AKI (acute kidney injury) ([place]) 08/10/2014  . Hyperkalemia 08/10/2014  . Leukocytosis 12020/11/913  . Port catheter in place 05/02/2012  . CLL (chronic lymphocytic leukemia) ([place]) 01/24/2011  . Melanoma in situ ([place]) 01/24/2011  . DM (diabetes mellitus) ([place]) 01/24/2011  . Mild obesity 01/24/2011  .  Eczema 01/24/2011    Past Surgical History:  Procedure Laterality Date  . bleeding ulcer    . CATARACT EXTRACTION W/PHACO  05/05/2012   Procedure: CATARACT EXTRACTION PHACO AND INTRAOCULAR LENS PLACEMENT (IOC);  Surgeon: Tonny Branch, MD;  Location: [facility];  Service: Ophthalmology;  Laterality: Right;  CDE:13.25  . CATARACT EXTRACTION W/PHACO  05/15/2012   Procedure: CATARACT EXTRACTION PHACO AND INTRAOCULAR LENS PLACEMENT (IOC);  Surgeon: Tonny Branch, MD;  Location: [facility] OR[facility];  Service: Ophthalmology;  Laterality: Left;  CDE:  12.32  . COLONOSCOPY N/A 01/30/2016   Procedure: COLONOSCOPY;  Surgeon: Danie Binder, MD;  Location: [facility] ENDO SUITE;  Service: Endoscopy;  Laterality: N/A;  2:00 PM - moved to 1:00 - office notified pt  . ESOPHAGOGASTRODUODENOSCOPY  2008   Dr. Oneida Alar: normal esophagus, antral erythema, 1 cm clean based duodenal ulcera, negative H. plyori  . ESOPHAGOGASTRODUODENOSCOPY N/A 01/30/2016   Procedure: ESOPHAGOGASTRODUODENOSCOPY (EGD);  Surgeon: Danie Binder, MD;  Location: [facility];  Service: Endoscopy;  Laterality: N/A;  . HERNIA REPAIR  2001  . PORTACATH PLACEMENT  2008       Home Medications    Prior to Admission medications   Medication Sig Start Date End Date Taking? Authorizing Provider  acyclovir (ZOVIRAX) 400 MG tablet Take 1 tablet (400 mg total) by mouth daily. 01/22/17  Yes Twana First, MD  allopurinol (ZYLOPRIM) 300 MG tablet Take 1 tablet (300 mg total) by mouth daily. 01/22/17  Yes Twana First,  MD  lidocaine-prilocaine (EMLA) cream Apply a quarter size amount to affected area 1 hour prior to coming to chemotherapy.  Do not rub in.  Cover with plastic wrap. 01/22/17  Yes Twana First, MD  ondansetron (ZOFRAN) 8 MG tablet Take 1 tablet (8 mg total) by mouth every 8 (eight) hours as needed for nausea or vomiting. 01/22/17  Yes Twana First, MD  prochlorperazine (COMPAZINE) 10 MG tablet Take 1 tablet (10 mg total) by mouth every 6 (six) hours as needed for nausea  or vomiting. 01/22/17  Yes Twana First, MD  rosuvastatin (CRESTOR) 40 MG tablet Take 40 mg by mouth at bedtime.  02/21/16  Yes [provider]  sevelamer carbonate (RENVELA) 800 MG tablet Take 2 tablets (1,600 mg total) by mouth 3 (three) times daily with meals. Patient taking differently: Take 800 mg by mouth 3 (three) times daily with meals.  05/28/16  Yes Isaac Bliss, Rayford Halsted, MD  sitaGLIPtin (JANUVIA) 100 MG tablet Take 100 mg by mouth daily.   Yes [provider]  sodium bicarbonate 650 MG tablet TAKE 1 TABLET BY MOUTH TWICE A DAY AS DIRECTED 04/24/16  Yes [provider]  sulfamethoxazole-trimethoprim (BACTRIM DS,SEPTRA DS) 800-160 MG tablet Take 1 tablet by mouth 3 (three) times a week. 01/23/17  Yes Twana First, MD  acetaminophen (TYLENOL) 500 MG tablet Take 500 mg by mouth at bedtime as needed for mild pain or moderate pain.     [provider]  chlorambucil (LEUKERAN) 2 MG tablet Take 32 mg PO (16 tablets) on Day 1 and 15 of each 28 day cycle of chemotherapy.  Give on an empty stomach 1 hour before or 2 hours after meals. 01/18/17   Baird Cancer, PA-C  cholecalciferol (VITAMIN D) 400 units TABS tablet Take 400 Units by mouth daily.    [provider]  IRON PO Take 1 tablet by mouth daily. Takes 2 to 3 times a week    [provider]  Obinutuzumab (GAZYVA IV) Inject into the vein.    [provider]    Family History Family History  Problem Relation Age of Onset  . Diabetes Father   . Colon cancer Neg Hx     Social History Social History  Substance Use Topics  . Smoking status: Former Research scientist (life sciences)  . Smokeless tobacco: Former Systems developer    Quit date: 04/25/1967  . Alcohol use No     Allergies   Aspirin and Ibuprofen   Review of Systems Review of Systems  Unable to perform ROS: Acuity of condition     Physical Exam Updated Vital Signs BP (!) 91/35 (BP Location: Left Arm)   Pulse (!) 50   Temp 98.7 F (37.1  C) (Oral)   Resp 18   Ht 5\' 1"  (1.549 m)   Wt 63.5 kg (140 lb)   SpO2 100%   BMI 26.45 kg/m   Physical Exam  Constitutional:  Initially unresponsive  HENT:  Head: Normocephalic and atraumatic.  Eyes: Conjunctivae are normal.  Neck: Neck supple.  Cardiovascular:  irregular  Pulmonary/Chest:  Dyspneic and tachypneic  Abdominal: Soft. Bowel sounds are normal.  Musculoskeletal: Normal range of motion.  Neurological:  obtunded  Skin: Skin is warm and dry.  Psychiatric:  unable  Nursing note and vitals reviewed.    ED Treatments / Results  Labs (all labs ordered are listed, but only abnormal results are displayed) Labs Reviewed  CBC - Abnormal; Notable for the following:  Result Value   WBC 149.9 (*)    RBC 2.14 (*)    Hemoglobin 7.9 (*)    HCT 26.3 (*)    MCV 122.9 (*)    MCH 36.9 (*)    RDW 16.3 (*)    Platelets 98 (*)    All other components within normal limits  URINALYSIS, ROUTINE W REFLEX MICROSCOPIC - Abnormal; Notable for the following:    Color, Urine STRAW (*)    Protein, ur 100 (*)    Bacteria, UA RARE (*)    Squamous Epithelial / LPF 0-5 (*)    All other components within normal limits  BASIC METABOLIC PANEL  TROPONIN I    EKG  EKG Interpretation  Date/Time:  Monday January 28 2017 18:57:23 EDT Ventricular Rate:  84 PR Interval:    QRS Duration: 146 QT Interval:  490 QTC Calculation: 579 R Axis:   -80 Text Interpretation:  Atrial fibrillation with premature ventricular or aberrantly conducted complexes Left axis deviation Right bundle branch block Abnormal ECG Confirmed by Nat Christen [phone]) on 01/28/2017 7:51:17 PM       Radiology Dg Chest Port 1 View  Result Date: 01/28/2017 CLINICAL DATA:  Dyspnea, pt has external pacer pad and c-pap mask EXAM: PORTABLE CHEST 1 VIEW COMPARISON:  Chest x-ray dated 08/24/2014. FINDINGS: Heart size and mediastinal contours are within normal limits given patient's oblique positioning. Pacer pad  overlies the heart. Lungs are clear. No pleural effusion or pneumothorax seen. No acute or suspicious osseous finding. IMPRESSION: No active disease.  No evidence of pneumonia or pulmonary edema. Electronically Signed   By: Franki Cabot M.D.   On: 01/28/2017 20:02    Procedures Procedures (including critical care time)  Medications Ordered in ED Medications  DOPamine (INTROPIN) 800 mg in dextrose 5 % 250 mL (3.2 mg/mL) infusion (not administered)     Initial Impression / Assessment and Plan / ED Course  I have reviewed the triage vital signs and the nursing notes.  Pertinent labs & imaging results that were available during my care of the patient were reviewed by me and considered in my medical decision making (see chart for details).     Uncertain etiology of patient's syncopal spell versus obtundation. EKG was abnormal with questionable atrial fibrillation with long pauses. CPR was performed for a brief amount of time. He received one dose of epinephrine and atropine. External pacing was performed for less than 5 minutes. BiPAP was initiated.Patient responded well to these maneuvers. Dopamine was started. Discussed care with cardiology, hospitalist medicine, critical care. Admission orders for ICU at [facility] were initiated.  2215:  Patient had another episode of obtundation. Monitor showed "asystole". A brief episode of CPR was initiated. Patient's pulse returned and his EKG showed no acute changes. Potassium was noted be elevated. IV Lasix, IV glucose, IV insulin, IV calcium, IV bicarbonate ordered. Discussed with my colleague Dr. Dolly Rias at [facility]. He will accept transfer. This was also discussed with the nursing supervisors at [facility].  Patient's condition also discussed with family members.   CRITICAL CARE Performed by: Nat Christen  ?  Total critical care time: 70 minutes  Critical care time was exclusive of separately billable procedures and treating other  patients.  Critical care was necessary to treat or prevent imminent or life-threatening deterioration.  Critical care was time spent personally by me on the following activities: development of treatment plan with patient and/or surrogate as well as nursing, discussions with consultants, evaluation  of patient's response to treatment, examination of patient, obtaining history from patient or surrogate, ordering and performing treatments and interventions, ordering and review of laboratory studies, ordering and review of radiographic studies, pulse oximetry and re-evaluation of patient's condition. Final Clinical Impressions(s) / ED Diagnoses   Final diagnoses:  Syncope, unspecified syncope type    New Prescriptions New Prescriptions   No medications on file     Nat Christen, MD 01/28/17 2201    Nat Christen, MD 01/28/17 2230    Nat Christen, MD 01/29/17 1245

## 2017-01-28 NOTE — ED Notes (Signed)
Dr Lacinda Axon in room speaking with family

## 2017-01-28 NOTE — ED Notes (Signed)
Pt alert, able to follow commands,

## 2017-01-28 NOTE — Progress Notes (Signed)
[facility] Progress Note Patient Name: Randall Reyes DOB: 1935-10-23 MRN: [id number]   Date of Service  01/28/2017  HPI/Events of Note  Patient arrived via EMS on BiPAP support post arrest. Reportedly awake and interactive somewhat. Questionable bradycardia but pulses are palpable. Known hyperkalemia.   eICU Interventions  1. Contacted intensivist for bedside assessment and stabilization 2. Ordered bicarbonate drip to temporize hyperkalemia      Intervention Category Evaluation Type: New Patient Evaluation  Tera Partridge 01/28/2017, 11:51 PM

## 2017-01-28 NOTE — Code Documentation (Signed)
Pt had return of pulses, pt brady on monitor, with rate of mid 30's, pacing started with mechanical and electrical monitor,

## 2017-01-28 NOTE — ED Notes (Signed)
Date and time results received: 01/28/17 2045 (use smartphrase ".now" to insert current time)  Test: Troponin Critical Value: 0.03  Name of Provider Notified: Dr. Lacinda Axon notified at 2103  Orders Received? Or Actions Taken?: no/na

## 2017-01-28 NOTE — ED Notes (Addendum)
Date and time results received: 01/28/17 2020 (use smartphrase ".now" to insert current time)  Test: WBC Critical Value: 149  Name of Provider Notified: Dr. Lacinda Axon notified at 2021  Orders Received? Or Actions Taken?: no/na

## 2017-01-28 NOTE — ED Notes (Signed)
Family with pastor at bedside,

## 2017-01-28 NOTE — ED Notes (Signed)
Dr Aggie Moats at bedside speaking with family,

## 2017-01-28 NOTE — ED Notes (Signed)
Family member called this nurse into room; pt found to have no radial pulse and monitor showed asystole; CPR started and Dr. Lacinda Axon notified and in room; After a round of CPR pt became responsive

## 2017-01-28 NOTE — ED Notes (Signed)
Pt hr 105, sinus tach on monitor, pacing stopped,

## 2017-01-28 NOTE — Progress Notes (Signed)
CRITICAL VALUE ALERT Critical value received:  WBC- 227.7  Date of notification:  01/28/17 Time of notification: 0900 Critical value read back:  Yes.   Nurse who received alert:  M.Marlette Curvin, LPN MD notified (1st page):  L.Talbert Cage, MD

## 2017-01-28 NOTE — ED Notes (Signed)
While attempting to load pt onto ems stretcher, pt rhythm became asystole, cpr started, Dr Roderic Palau at bedside, pt given 1 mg epipherine and 1 mg atropine with return of pulses,

## 2017-01-28 NOTE — ED Notes (Signed)
Pt had port accessed from chemo this am upon arrival to er, port would not flush well, port reaccessed by Ron RN with no problems flushing saline but would not draw blood back. Per family pt's port has never been able to have blood work drawn from it.

## 2017-01-28 NOTE — ED Provider Notes (Addendum)
Received a call from [facility] emergency room that this patientis planned for admission here to our critical care unit after having had multiple rounds of CPR and is on BiPAP. Patient already admitted to our hospital  Plan will be emergency room until bed opens up in ICU. On review of the labs the patient does appear to have had elevated potassium which could be related to symptoms also acidosis. Hyperkalemia medications will be given prior to departure from there. Also will add on uric acid, phosphorus, lactic acid to assess for e/o tumor lysis.    Merrily Pew, MD 01/28/17 2220

## 2017-01-28 NOTE — ED Notes (Signed)
CRITICAL VALUE ALERT  Critical Value: Lactic Acid 9.2 Date & Time Notied:  01/28/17 @ 2303 Provider Notified:Dr. Roderic Palau Orders Received/Actions taken: None yet

## 2017-01-29 ENCOUNTER — Other Ambulatory Visit: Payer: Self-pay

## 2017-01-29 ENCOUNTER — Inpatient Hospital Stay (HOSPITAL_COMMUNITY): Payer: Medicare Other

## 2017-01-29 ENCOUNTER — Ambulatory Visit (HOSPITAL_COMMUNITY): Payer: Medicare Other

## 2017-01-29 DIAGNOSIS — I371 Nonrheumatic pulmonary valve insufficiency: Secondary | ICD-10-CM

## 2017-01-29 DIAGNOSIS — E872 Acidosis, unspecified: Secondary | ICD-10-CM

## 2017-01-29 DIAGNOSIS — R55 Syncope and collapse: Secondary | ICD-10-CM

## 2017-01-29 DIAGNOSIS — I351 Nonrheumatic aortic (valve) insufficiency: Secondary | ICD-10-CM

## 2017-01-29 DIAGNOSIS — I469 Cardiac arrest, cause unspecified: Secondary | ICD-10-CM

## 2017-01-29 DIAGNOSIS — I340 Nonrheumatic mitral (valve) insufficiency: Secondary | ICD-10-CM

## 2017-01-29 DIAGNOSIS — Z452 Encounter for adjustment and management of vascular access device: Secondary | ICD-10-CM

## 2017-01-29 DIAGNOSIS — J9600 Acute respiratory failure, unspecified whether with hypoxia or hypercapnia: Secondary | ICD-10-CM

## 2017-01-29 DIAGNOSIS — Z0189 Encounter for other specified special examinations: Secondary | ICD-10-CM

## 2017-01-29 LAB — POCT I-STAT 3, ART BLOOD GAS (G3+)
ACID-BASE DEFICIT: 8 mmol/L — AB (ref 0.0–2.0)
ACID-BASE EXCESS: 3 mmol/L — AB (ref 0.0–2.0)
Acid-Base Excess: 1 mmol/L (ref 0.0–2.0)
Acid-Base Excess: 2 mmol/L (ref 0.0–2.0)
BICARBONATE: 17.3 mmol/L — AB (ref 20.0–28.0)
BICARBONATE: 23.4 mmol/L (ref 20.0–28.0)
Bicarbonate: 26.5 mmol/L (ref 20.0–28.0)
Bicarbonate: 27.7 mmol/L (ref 20.0–28.0)
O2 SAT: 99 %
O2 Saturation: 100 %
O2 Saturation: 100 %
O2 Saturation: 100 %
PCO2 ART: 39.2 mmHg (ref 32.0–48.0)
PH ART: 7.313 — AB (ref 7.350–7.450)
PH ART: 7.514 — AB (ref 7.350–7.450)
PH ART: 7.439 (ref 7.350–7.450)
PO2 ART: 128 mmHg — AB (ref 83.0–108.0)
Patient temperature: 98.6
TCO2: 18 mmol/L — ABNORMAL LOW (ref 22–32)
TCO2: 24 mmol/L (ref 22–32)
TCO2: 28 mmol/L (ref 22–32)
TCO2: 29 mmol/L (ref 22–32)
pCO2 arterial: 34.1 mmHg (ref 32.0–48.0)
pCO2 arterial: 29.1 mmHg — ABNORMAL LOW (ref 32.0–48.0)
pCO2 arterial: 40.2 mmHg (ref 32.0–48.0)
pH, Arterial: 7.444 (ref 7.350–7.450)
pO2, Arterial: 183 mmHg — ABNORMAL HIGH (ref 83.0–108.0)
pO2, Arterial: 166 mmHg — ABNORMAL HIGH (ref 83.0–108.0)
pO2, Arterial: 221 mmHg — ABNORMAL HIGH (ref 83.0–108.0)

## 2017-01-29 LAB — GLUCOSE, CAPILLARY
GLUCOSE-CAPILLARY: 308 mg/dL — AB (ref 65–99)
GLUCOSE-CAPILLARY: 124 mg/dL — AB (ref 65–99)
GLUCOSE-CAPILLARY: 129 mg/dL — AB (ref 65–99)
GLUCOSE-CAPILLARY: 102 mg/dL — AB (ref 65–99)
Glucose-Capillary: 393 mg/dL — ABNORMAL HIGH (ref 65–99)
Glucose-Capillary: 357 mg/dL — ABNORMAL HIGH (ref 65–99)
Glucose-Capillary: 110 mg/dL — ABNORMAL HIGH (ref 65–99)

## 2017-01-29 LAB — MAGNESIUM: Magnesium: 1.9 mg/dL (ref 1.7–2.4)

## 2017-01-29 LAB — BLOOD GAS, ARTERIAL
Acid-base deficit: 12 mmol/L — ABNORMAL HIGH (ref 0.0–2.0)
Bicarbonate: 14.7 mmol/L — ABNORMAL LOW (ref 20.0–28.0)
Drawn by: 51133
FIO2: 100
LHR: 18 {breaths}/min
O2 SAT: 97.7 %
PATIENT TEMPERATURE: 98.6
PCO2 ART: 40.2 mmHg (ref 32.0–48.0)
PEEP/CPAP: 5 cmH2O
PH ART: 7.188 — AB (ref 7.350–7.450)
VT: 550 mL
pO2, Arterial: 133 mmHg — ABNORMAL HIGH (ref 83.0–108.0)

## 2017-01-29 LAB — MRSA PCR SCREENING: MRSA by PCR: NEGATIVE

## 2017-01-29 LAB — BASIC METABOLIC PANEL
Anion gap: 11 (ref 5–15)
Anion gap: 10 (ref 5–15)
Anion gap: 5 (ref 5–15)
BUN: 41 mg/dL — AB (ref 6–20)
BUN: 38 mg/dL — AB (ref 6–20)
BUN: 26 mg/dL — AB (ref 6–20)
CALCIUM: 7.4 mg/dL — AB (ref 8.9–10.3)
CALCIUM: 7.4 mg/dL — AB (ref 8.9–10.3)
CHLORIDE: 102 mmol/L (ref 101–111)
CO2: 20 mmol/L — ABNORMAL LOW (ref 22–32)
CO2: 24 mmol/L (ref 22–32)
CO2: 28 mmol/L (ref 22–32)
CREATININE: 3.37 mg/dL — AB (ref 0.61–1.24)
Calcium: 7.3 mg/dL — ABNORMAL LOW (ref 8.9–10.3)
Chloride: 108 mmol/L (ref 101–111)
Chloride: 104 mmol/L (ref 101–111)
Creatinine, Ser: 2.27 mg/dL — ABNORMAL HIGH (ref 0.61–1.24)
Creatinine, Ser: 1.46 mg/dL — ABNORMAL HIGH (ref 0.61–1.24)
GFR calc Af Amer: 19 mL/min — ABNORMAL LOW (ref >60)
GFR calc Af Amer: 30 mL/min — ABNORMAL LOW (ref >60)
GFR calc Af Amer: 51 mL/min — ABNORMAL LOW (ref >60)
GFR calc non Af Amer: 44 mL/min — ABNORMAL LOW (ref >60)
GFR, EST NON AFRICAN AMERICAN: 16 mL/min — AB (ref >60)
GFR, EST NON AFRICAN AMERICAN: 26 mL/min — AB (ref >60)
GLUCOSE: 308 mg/dL — AB (ref 65–99)
GLUCOSE: 121 mg/dL — AB (ref 65–99)
Glucose, Bld: 126 mg/dL — ABNORMAL HIGH (ref 65–99)
POTASSIUM: 6 mmol/L — AB (ref 3.5–5.1)
Potassium: 6.3 mmol/L (ref 3.5–5.1)
Potassium: 6.2 mmol/L — ABNORMAL HIGH (ref 3.5–5.1)
SODIUM: 139 mmol/L (ref 135–145)
Sodium: 138 mmol/L (ref 135–145)
Sodium: 135 mmol/L (ref 135–145)

## 2017-01-29 LAB — POCT I-STAT, CHEM 8
BUN: 37 mg/dL — ABNORMAL HIGH (ref 6–20)
CALCIUM ION: 1.19 mmol/L (ref 1.15–1.40)
Chloride: 109 mmol/L (ref 101–111)
Creatinine, Ser: 2.9 mg/dL — ABNORMAL HIGH (ref 0.61–1.24)
GLUCOSE: 407 mg/dL — AB (ref 65–99)
HCT: 23 % — ABNORMAL LOW (ref 39.0–52.0)
HEMOGLOBIN: 7.8 g/dL — AB (ref 13.0–17.0)
Potassium: 5.3 mmol/L — ABNORMAL HIGH (ref 3.5–5.1)
Sodium: 144 mmol/L (ref 135–145)
TCO2: 18 mmol/L — ABNORMAL LOW (ref 22–32)

## 2017-01-29 LAB — RENAL FUNCTION PANEL
ANION GAP: 7 (ref 5–15)
Albumin: 2.8 g/dL — ABNORMAL LOW (ref 3.5–5.0)
BUN: 32 mg/dL — ABNORMAL HIGH (ref 6–20)
CALCIUM: 7.3 mg/dL — AB (ref 8.9–10.3)
CHLORIDE: 105 mmol/L (ref 101–111)
CO2: 25 mmol/L (ref 22–32)
Creatinine, Ser: 2 mg/dL — ABNORMAL HIGH (ref 0.61–1.24)
GFR, EST AFRICAN AMERICAN: 35 mL/min — AB (ref >60)
GFR, EST NON AFRICAN AMERICAN: 30 mL/min — AB (ref >60)
Glucose, Bld: 123 mg/dL — ABNORMAL HIGH (ref 65–99)
Phosphorus: 5.6 mg/dL — ABNORMAL HIGH (ref 2.5–4.6)
Potassium: 6.3 mmol/L (ref 3.5–5.1)
SODIUM: 137 mmol/L (ref 135–145)

## 2017-01-29 LAB — CBC
HEMATOCRIT: 24 % — AB (ref 39.0–52.0)
Hemoglobin: 7.5 g/dL — ABNORMAL LOW (ref 13.0–17.0)
MCH: 36.4 pg — ABNORMAL HIGH (ref 26.0–34.0)
MCHC: 31.3 g/dL (ref 30.0–36.0)
MCV: 116.5 fL — AB (ref 78.0–100.0)
Platelets: 76 10*3/uL — ABNORMAL LOW (ref 150–400)
RBC: 2.06 MIL/uL — ABNORMAL LOW (ref 4.22–5.81)
RDW: 16.3 % — AB (ref 11.5–15.5)
WBC: 124.7 10*3/uL — AB (ref 4.0–10.5)

## 2017-01-29 LAB — LACTIC ACID, PLASMA
LACTIC ACID, VENOUS: 4.7 mmol/L — AB (ref 0.5–1.9)
Lactic Acid, Venous: 5.7 mmol/L (ref 0.5–1.9)
Lactic Acid, Venous: 1.7 mmol/L (ref 0.5–1.9)

## 2017-01-29 LAB — PHOSPHORUS: PHOSPHORUS: 6.2 mg/dL — AB (ref 2.5–4.6)

## 2017-01-29 MED ORDER — PRISMASOL BGK 4/2.5 32-4-2.5 MEQ/L IV SOLN
INTRAVENOUS | Status: DC
  Administered 2017-01-29: 10:00:00 via INTRAVENOUS_CENTRAL
  Filled 2017-01-29 (×4): qty 5000

## 2017-01-29 MED ORDER — ROCURONIUM BROMIDE 50 MG/5ML IV SOLN
1.0000 mg/kg | Freq: Once | INTRAVENOUS | Status: AC
  Administered 2017-01-29: 63.5 mg via INTRAVENOUS

## 2017-01-29 MED ORDER — STERILE WATER FOR INJECTION IV SOLN
INTRAVENOUS | Status: DC
  Filled 2017-01-29 (×2): qty 150

## 2017-01-29 MED ORDER — CALCIUM CHLORIDE 10 % IV SOLN
1.0000 g | Freq: Once | INTRAVENOUS | Status: AC
  Administered 2017-01-29: 1 g via INTRAVENOUS

## 2017-01-29 MED ORDER — HEPARIN SODIUM (PORCINE) 1000 UNIT/ML DIALYSIS
1000.0000 [IU] | INTRAMUSCULAR | Status: DC | PRN
  Administered 2017-01-29 (×2): 1400 [IU] via INTRAVENOUS_CENTRAL
  Filled 2017-01-29 (×2): qty 6
  Filled 2017-01-29: qty 3
  Filled 2017-01-29: qty 6

## 2017-01-29 MED ORDER — DOPAMINE-DEXTROSE 3.2-5 MG/ML-% IV SOLN
0.0000 ug/kg/min | INTRAVENOUS | Status: DC
  Administered 2017-01-29: 5 ug/kg/min via INTRAVENOUS

## 2017-01-29 MED ORDER — MIDAZOLAM HCL 2 MG/2ML IJ SOLN: 1.0000 mg | INTRAMUSCULAR | Status: DC | PRN

## 2017-01-29 MED ORDER — EPINEPHRINE PF 1 MG/ML IJ SOLN
1.0000 mg | Freq: Once | INTRAMUSCULAR | Status: AC
  Administered 2017-01-29: 1 mg via INTRAVENOUS

## 2017-01-29 MED ORDER — STERILE WATER FOR INJECTION IV SOLN
150.0000 meq | INTRAVENOUS | Status: DC
  Filled 2017-01-29: qty 850

## 2017-01-29 MED ORDER — SODIUM CHLORIDE 0.9 % IV SOLN
1.0000 g | Freq: Two times a day (BID) | INTRAVENOUS | Status: AC
  Administered 2017-01-29 – 2017-02-04 (×14): 1 g via INTRAVENOUS
  Filled 2017-01-29 (×14): qty 1

## 2017-01-29 MED ORDER — SODIUM CHLORIDE 0.9 % IV SOLN
1500.0000 mg | Freq: Once | INTRAVENOUS | Status: AC
  Administered 2017-01-29: 1500 mg via INTRAVENOUS
  Filled 2017-01-29: qty 15

## 2017-01-29 MED ORDER — PRISMASOL BGK 0/2.5 32-2.5 MEQ/L IV SOLN
INTRAVENOUS | Status: DC
  Administered 2017-01-29 – 2017-01-30 (×4): via INTRAVENOUS_CENTRAL
  Filled 2017-01-29 (×8): qty 5000

## 2017-01-29 MED ORDER — PRISMASOL BGK 0/2.5 32-2.5 MEQ/L IV SOLN
INTRAVENOUS | Status: DC
  Filled 2017-01-29 (×5): qty 5000

## 2017-01-29 MED ORDER — HEPARIN SODIUM (PORCINE) 1000 UNIT/ML DIALYSIS
1000.0000 [IU] | INTRAMUSCULAR | Status: DC | PRN
  Filled 2017-01-29: qty 6
  Filled 2017-01-29: qty 3

## 2017-01-29 MED ORDER — CHLORHEXIDINE GLUCONATE 0.12% ORAL RINSE (MEDLINE KIT)
15.0000 mL | Freq: Two times a day (BID) | OROMUCOSAL | Status: DC
  Administered 2017-01-29 – 2017-01-31 (×5): 15 mL via OROMUCOSAL

## 2017-01-29 MED ORDER — FENTANYL CITRATE (PF) 100 MCG/2ML IJ SOLN
50.0000 ug | INTRAMUSCULAR | Status: DC | PRN
  Administered 2017-01-29: 50 ug via INTRAVENOUS
  Filled 2017-01-29: qty 2

## 2017-01-29 MED ORDER — PIPERACILLIN-TAZOBACTAM 3.375 G IVPB
3.3750 g | Freq: Two times a day (BID) | INTRAVENOUS | Status: DC
  Filled 2017-01-29: qty 50

## 2017-01-29 MED ORDER — SODIUM BICARBONATE 8.4 % IV SOLN
INTRAVENOUS | Status: AC
  Administered 2017-01-29: 50 meq
  Filled 2017-01-29: qty 50

## 2017-01-29 MED ORDER — PRISMASOL BGK 4/2.5 32-4-2.5 MEQ/L IV SOLN
INTRAVENOUS | Status: DC
  Administered 2017-01-29 (×3): via INTRAVENOUS_CENTRAL
  Filled 2017-01-29 (×9): qty 5000

## 2017-01-29 MED ORDER — DEXTROSE 5 % IV SOLN
0.0000 ug/min | INTRAVENOUS | Status: DC
  Administered 2017-01-29: 5 ug/min via INTRAVENOUS
  Administered 2017-01-30: 2 ug/min via INTRAVENOUS
  Administered 2017-01-31: 10 ug/min via INTRAVENOUS
  Filled 2017-01-29 (×4): qty 4

## 2017-01-29 MED ORDER — PRISMASOL BGK 0/2.5 32-2.5 MEQ/L IV SOLN
INTRAVENOUS | Status: DC
  Administered 2017-01-29: 23:00:00 via INTRAVENOUS_CENTRAL
  Filled 2017-01-29 (×2): qty 5000

## 2017-01-29 MED ORDER — DOPAMINE-DEXTROSE 3.2-5 MG/ML-% IV SOLN
5.0000 ug/kg/min | INTRAVENOUS | Status: DC
Start: 2017-01-29 — End: 2017-01-29

## 2017-01-29 MED ORDER — ORAL CARE MOUTH RINSE
15.0000 mL | Freq: Four times a day (QID) | OROMUCOSAL | Status: DC
  Administered 2017-01-29 – 2017-01-31 (×9): 15 mL via OROMUCOSAL

## 2017-01-29 MED ORDER — INSULIN ASPART 100 UNIT/ML IV SOLN
10.0000 [IU] | Freq: Once | INTRAVENOUS | Status: AC
  Administered 2017-01-29: 10 [IU] via INTRAVENOUS

## 2017-01-29 MED ORDER — PRISMASOL BGK 0/2.5 32-2.5 MEQ/L IV SOLN
INTRAVENOUS | Status: DC
  Administered 2017-01-29 – 2017-01-30 (×2): via INTRAVENOUS_CENTRAL
  Filled 2017-01-29 (×3): qty 5000

## 2017-01-29 MED ORDER — PRISMASOL BGK 4/2.5 32-4-2.5 MEQ/L IV SOLN
INTRAVENOUS | Status: DC
  Administered 2017-01-29 (×2): via INTRAVENOUS_CENTRAL
  Filled 2017-01-29 (×7): qty 5000

## 2017-01-29 MED ORDER — SODIUM CHLORIDE 0.9 % IV SOLN
1000.0000 mg | Freq: Two times a day (BID) | INTRAVENOUS | Status: DC
  Administered 2017-01-29 – 2017-01-30 (×2): 1000 mg via INTRAVENOUS
  Filled 2017-01-29 (×2): qty 10

## 2017-01-29 MED ORDER — SODIUM CHLORIDE 0.9 % IV SOLN
1.0000 g | Freq: Once | INTRAVENOUS | Status: AC
  Administered 2017-01-29: 1 g via INTRAVENOUS
  Filled 2017-01-29: qty 10

## 2017-01-29 MED ORDER — VANCOMYCIN HCL IN DEXTROSE 750-5 MG/150ML-% IV SOLN
750.0000 mg | INTRAVENOUS | Status: DC
  Administered 2017-01-29 – 2017-01-31 (×3): 750 mg via INTRAVENOUS
  Filled 2017-01-29 (×3): qty 150

## 2017-01-29 MED ORDER — INSULIN ASPART 100 UNIT/ML ~~LOC~~ SOLN
0.0000 [IU] | SUBCUTANEOUS | Status: DC
  Administered 2017-01-29: 11 [IU] via SUBCUTANEOUS
  Administered 2017-01-29 (×2): 15 [IU] via SUBCUTANEOUS

## 2017-01-29 MED ORDER — INSULIN ASPART 100 UNIT/ML ~~LOC~~ SOLN: 2.0000 [IU] | SUBCUTANEOUS | Status: DC

## 2017-01-29 MED ORDER — INSULIN ASPART 100 UNIT/ML ~~LOC~~ SOLN
0.0000 [IU] | SUBCUTANEOUS | Status: DC
  Administered 2017-01-29 – 2017-01-30 (×2): 3 [IU] via SUBCUTANEOUS
  Administered 2017-01-30: 4 [IU] via SUBCUTANEOUS
  Administered 2017-01-30 (×2): 3 [IU] via SUBCUTANEOUS
  Administered 2017-01-31 (×4): 4 [IU] via SUBCUTANEOUS
  Administered 2017-02-01: 11 [IU] via SUBCUTANEOUS
  Administered 2017-02-01: 4 [IU] via SUBCUTANEOUS
  Administered 2017-02-01: 7 [IU] via SUBCUTANEOUS
  Administered 2017-02-01: 11 [IU] via SUBCUTANEOUS
  Administered 2017-02-01: 4 [IU] via SUBCUTANEOUS
  Administered 2017-02-02: 7 [IU] via SUBCUTANEOUS
  Administered 2017-02-02: 4 [IU] via SUBCUTANEOUS
  Administered 2017-02-02: 3 [IU] via SUBCUTANEOUS
  Administered 2017-02-02 (×2): 4 [IU] via SUBCUTANEOUS

## 2017-01-29 MED ORDER — SODIUM CHLORIDE 0.9 % IV SOLN
INTRAVENOUS | Status: DC | PRN
  Administered 2017-01-29: 05:00:00 via INTRA_ARTERIAL

## 2017-01-29 MED ORDER — HEPARIN (PORCINE) 2000 UNITS/L FOR CRRT
INTRAVENOUS_CENTRAL | Status: DC | PRN
  Filled 2017-01-29: qty 1000

## 2017-01-29 MED ORDER — SODIUM POLYSTYRENE SULFONATE 15 GM/60ML PO SUSP
45.0000 g | Freq: Once | ORAL | Status: DC
  Filled 2017-01-29: qty 180

## 2017-01-29 MED ORDER — SODIUM CHLORIDE 0.9 % FOR CRRT
INTRAVENOUS_CENTRAL | Status: DC | PRN
  Administered 2017-01-29: 11:00:00 via INTRAVENOUS_CENTRAL
  Filled 2017-01-29: qty 1000

## 2017-01-29 MED ORDER — SODIUM BICARBONATE 8.4 % IV SOLN
50.0000 meq | Freq: Once | INTRAVENOUS | Status: AC
  Administered 2017-01-29: 50 meq via INTRAVENOUS

## 2017-01-29 MED ORDER — STERILE WATER FOR INJECTION IV SOLN
150.0000 meq | INTRAVENOUS | Status: DC
  Administered 2017-01-29: 150 meq via INTRAVENOUS
  Filled 2017-01-29 (×3): qty 850

## 2017-01-29 MED FILL — Medication: Qty: 1 | Status: AC

## 2017-01-29 NOTE — Progress Notes (Signed)
EEG completed, results pending. 

## 2017-01-29 NOTE — Plan of Care (Signed)
Problem: Safety: Goal: Ability to remain free from injury will improve Outcome: Progressing Pt following commands and safety precautions

## 2017-01-29 NOTE — Progress Notes (Signed)
Inpatient Diabetes Program Recommendations  AACE/ADA: New Consensus Statement on Inpatient Glycemic Control (2015)  Target Ranges:  Prepandial:   less than 140 mg/dL      Peak postprandial:   less than 180 mg/dL (1-2 hours)      Critically ill patients:  140 - 180 mg/dL   Lab Results  Component Value Date   GLUCAP 308 (H) 01/29/2017   HGBA1C 6.5 (H) 05/24/2016    Review of Glycemic Control Results for CLEOPHUS, MENDONSA (MRN [id number]) as of 01/29/2017 10:17  Ref. Range 01/29/2017 01:45 01/29/2017 05:23 01/29/2017 07:38  Glucose-Capillary Latest Ref Range: 65 - 99 mg/dL 393 (H) 357 (H) 308 (H)   Diabetes history: DM2 Outpatient Diabetes medications: Januvia 100 mg qd Current orders for Inpatient glycemic control: Novolog correction 0-20 units q 4 hrs.  Inpatient Diabetes Program Recommendations:  Current CBGs [phone]. Please consider ICU Glycemic orders phase 2 IV insulin for glycemic control.  Will follow during hospitalization.  Thank you, Nani Gasser. Sharif Rendell, RN, MSN, CDE  Diabetes Coordinator Inpatient Glycemic Control Team Team Pager [phone] (8am-5pm) 01/29/2017 10:18 AM

## 2017-01-29 NOTE — Progress Notes (Addendum)
PULMONARY / CRITICAL CARE MEDICINE   Name: Randall Reyes MRN: [id number] DOB: [birth year]    ADMISSION DATE:  01/28/2017 CONSULTATION DATE:  01/28/2017  REFERRING MD:  Dr. Dayna Barker   CHIEF COMPLAINT:  Syncope   HISTORY OF PRESENT ILLNESS:   81 year old male with PMH of Anemia, CLL (diagnosed 2006, treated with FCR in 2010 -- only received 3 cycles due to prolonged pancytopenia, 2015 started on Imbruica, Treated with 1 cycle BR 12/18/5641- 08/10/5186 complicated by severe tumor lysis syndrome (despite allopurinol and rasburicase) requiring hospitalization and dialysis), DM, HTN  Presents to ED on 9/17 after syncopal episode at home requiring brief CPR, family reports that patient underwent chemotherapy treatment earlier in the day. Upon arrival to ED patient was noted to be alert, however during stay experienced another episode on unresponsiveness and was noted to be pulseless. After one round of CPR ROSC was achieved. WBC 149.9, K 6.0, Uric Acid <0.5. Patient transferred to [facility] for further management. PCCM asked to admit.     SUBJECTIVE:  Hypotensive this AM > started on dopamine but had ectopy so d/c'd and switched to norepi. K 6.3 > given calcium, insulin, kayexalate.   Remains unresponsive despite no sedation   VITAL SIGNS: BP (!) 78/48   Pulse (!) 55   Temp 98.8 F (37.1 C) (Oral)   Resp (!) 26   Ht 5\' 1"  (1.549 m)   Wt 68.3 kg (150 lb 9.2 oz)   SpO2 100%   BMI 28.45 kg/m   HEMODYNAMICS:    VENTILATOR SETTINGS: Vent Mode: PRVC FiO2 (%):  [70 %-100 %] 80 % Set Rate:  [18 bmp-26 bmp] 26 bmp Vt Set:  [550 mL] 550 mL PEEP:  [5 cmH20] 5 cmH20 Plateau Pressure:  [15 cmH20-18 cmH20] 18 cmH20  INTAKE / OUTPUT: I/O last 3 completed shifts: In: 813.9 [I.V.:783.9; NG/GT:30] Out: 1025 [Urine:1025]  PHYSICAL EXAMINATION: General:  Elderly male, critically ill Neuro:  Non-responsive despite no sedation HEENT:  Temporal wasting.  MM dry. Cardiovascular:   Loletha Grayer, no MRG  Lungs:  Non-labored, no wheeze/crackles  Abdomen:  Non-tender, active bowel sounds  Musculoskeletal:  -edema  Skin:  Warm, dry   LABS:  BMET  Recent Labs Lab 01/28/17 0801 01/28/17 1949 01/29/17 0223 01/29/17 0759  NA 137 138 144 139  K 4.8 6.0* 5.3* 6.3*  CL 102 108 109 108  CO2 26 9*  --  20*  BUN 31* 38* 37* 41*  CREATININE 2.66* 3.34* 2.90* 3.37*  GLUCOSE 154* 360* 407* 308*    Electrolytes  Recent Labs Lab 01/28/17 0801 01/28/17 1949 01/28/17 2011 01/29/17 0552 01/29/17 0759  CALCIUM 8.6* 7.7*  --   --  7.4*  MG 2.2  --  2.4 1.9  --   PHOS 3.3  --  7.6* 6.2*  --     CBC  Recent Labs Lab 01/28/17 0801 01/28/17 1949 01/29/17 0223 01/29/17 0552  WBC 227.7* 149.9*  --  124.7*  HGB 8.8* 7.9* 7.8* 7.5*  HCT 27.3* 26.3* 23.0* 24.0*  PLT 123* 98*  --  76*    Coag's No results for input(s): APTT, INR in the last 168 hours.  Sepsis Markers  Recent Labs Lab 01/28/17 2011 01/29/17 0100 01/29/17 0428  LATICACIDVEN 9.2* 5.7* 4.7*    ABG  Recent Labs Lab 01/29/17 0055 01/29/17 0329 01/29/17 0832  PHART 7.188* 7.313* 7.514*  PCO2ART 40.2 34.1 29.1*  PO2ART 133* 128.0* 183.0*    Liver Enzymes  Recent  Labs Lab 01/28/17 0801  AST 21  ALT 12*  ALKPHOS 112  BILITOT 0.5  ALBUMIN 3.8    Cardiac Enzymes  Recent Labs Lab 01/28/17 2011  TROPONINI 0.03*    Glucose  Recent Labs Lab 01/29/17 0145 01/29/17 0523 01/29/17 0738  GLUCAP 393* 357* 308*    Imaging Dg Chest Port 1 View  Result Date: 01/29/2017 CLINICAL DATA:  Orogastric tube placement and central line placement EXAM: PORTABLE CHEST 1 VIEW COMPARISON:  01/28/2017 FINDINGS: Endotracheal tube has been inserted and the tip is about 4.7 cm superior to the carina. Right-sided central venous catheter tip overlies the SVC. Left jugular venous catheter tips overlie a the brachiocephalic confluence. No pneumothorax is seen. Borderline cardiomegaly. Development of  patchy right greater than left lower lung opacity. IMPRESSION: 1. Endotracheal tube tip about 4.7 cm superior to carina. Left-sided central venous catheter tips overlie the brachiocephalic confluence. No pneumothorax 2. Interim development of right greater than left lower lung airspace disease which may reflect pneumonia or asymmetric edema. Electronically Signed   By: Donavan Foil M.D.   On: 01/29/2017 01:25   Dg Chest Port 1 View  Result Date: 01/28/2017 CLINICAL DATA:  Dyspnea, pt has external pacer pad and c-pap mask EXAM: PORTABLE CHEST 1 VIEW COMPARISON:  Chest x-ray dated 08/24/2014. FINDINGS: Heart size and mediastinal contours are within normal limits given patient's oblique positioning. Pacer pad overlies the heart. Lungs are clear. No pleural effusion or pneumothorax seen. No acute or suspicious osseous finding. IMPRESSION: No active disease.  No evidence of pneumonia or pulmonary edema. Electronically Signed   By: Franki Cabot M.D.   On: 01/28/2017 20:02     STUDIES:  CXR 9/17 > No acute  CXR 9/18 > RLL opacity > LLL EEG 9/18 >  CULTURES: Sputum 9/18 > BC 9/18 >  ANTIBIOTICS: Vanc  9/18 >  Merrem 9/18 >   SIGNIFICANT EVENTS: 9/17 > Presents to ED, multiple codes  LINES/TUBES: Right Chest Port > L IJ CVL 9/17 >  L femoral A line 9/18 >    DISCUSSION: 81 year old male with CLL presents to ED on 9/18 with syncopal event. K 6.0, WBC 149.9. Suspected to be in Tumor Lysis. Nephrology consulted.   ASSESSMENT / PLAN:  PULMONARY A: Respiratory Insufficiency - s/p intubation 9/18 Resp alkalosis Possible HCAP - CXR 9/18 with RLL > LLL opacity P:   Full vent support ABG noted - drop rate 22, FiO2 70 Bronchial hygien Send sputum culture Start empiric abx CXR in AM  CARDIOVASCULAR A:  Hypotension - shock? 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 (base crt 2.0-2.2) s/p 3L NS in ED  AGMA P:   Nephrology following, planning for CRRT to start this AM In meantime, give 10u insulin, 1g Ca gluconate Trend BMP q6h  D/c bicarb gtt once K corrects  GASTROINTESTINAL A:   GI prophylaxis P:   NPO PPI   HEMATOLOGIC A:   Tumor Lysis Syndrome  Chronic Lymphocytic Leukemia  -Began Obinutuzumab + Chlorambucil 9/17 with Rasburicase  H/O Complications/Poor tolerance with Chemotherapy  Chronic anemia and thrombocytopenia - due to chemo P:  Assess uric acid level - may need additional rasburicase Will notify oncology Follow CBC  INFECTIOUS A:   Possible HCAP - CXR 9/18 with RLL > LLL opacity.  At risk MDR given chemo / immunocompromised host P:   Assess sputum culture, blood culture Start vanc / merrem - STAT  ENDOCRINE A:   DM   P:   Trend Glucose  SSI - may need to transition to gtt  NEUROLOGIC A:   Acute Encephalopathy - remains unresponsive AM 9/18 despite no sedation P:   RASS goal: 0/-1 Fentanyl PRN  Dc versed  Get EEG   FAMILY  - Updates: Family updated 9/17.  Son and daughter in law updated 9/18.  - Inter-disciplinary family meet or Palliative Care meeting due by: 02/05/2017   CC time: 35 min.   Montey Hora, [facility] Pager: [phone]  or [phone] 01/29/2017, 9:10 AM   STAFF NOTE: I, Merrie Roof, MD FACP have personally reviewed patient's available data, including medical history, events of note, physical examination and test results as part of my evaluation. I have discussed with resident/NP and other care providers such as pharmacist, RN and RRT. In addition, I personally evaluated patient and elicited key findings of: not awake, not alert, perrl 47mm, not fc, jvd low, rt coarse slight,abdo soft, BS wnl, no r/g, no  rash, no edema, pcxr which I reviewed shows worsening infiltrate RT base ett , line wnl, he has coded multiple times, major concerns sepsis as source of all of this, he is at risk and h/o tumor lyesis, volume has bee3n given K correction asd able, for cvvhd, start STAT ABX vanc, consider meropenem for risk MDR, BC send, sputum send off, k follow up, bicarb drip to continued until cvvhd corrects K, ABg reviewed reduce MV, repeat abg, peep to 10, in hopes to get fio2 down to 50%, eeg to assess for subclinical seziures, PCT does not work in immunocompromised host, start TF today, no family at bedside, following lactic, uric, k, egt echo to rule outr cardiomyopathy  The patient is critically ill with multiple organ systems failure and requires high complexity decision making for assessment and support, frequent evaluation and titration of therapies, application of advanced monitoring technologies and extensive interpretation of multiple databases.   Critical Care Time devoted to patient care services described in this note is 45 Minutes. This time reflects time of care of this signee: Merrie Roof, MD FACP. This critical care time does not reflect procedure time, or teaching time or supervisory time of PA/NP/Med student/Med Resident etc but could involve care discussion time. Rest per NP/medical resident whose note is outlined above and that I agree with   Lavon Paganini. Titus Mould, MD, FACP Pgr: [facility] 01/29/2017 9:30 AM  I have had extensive discussions with family wife and son. We discussed patients current circumstances and organ failures. We also discussed patient's prior wishes under circumstances such as this. Family has decided to NOT perform resuscitation if arrest but to continue current medical support for now.

## 2017-01-29 NOTE — Procedures (Signed)
Arterial Catheter Insertion Procedure Note Randall Reyes [id number] 1935-10-20  Procedure: Insertion of Arterial Catheter  Indications: Blood pressure monitoring and Frequent blood sampling  Procedure Details Consent: Risks of procedure as well as the alternatives and risks of each were explained to the (patient/caregiver).  Consent for procedure obtained. Time Out: Verified patient identification, verified procedure, site/side was marked, verified correct patient position, special equipment/implants available, medications/allergies/relevent history reviewed, required imaging and test results available.  Performed  Maximum sterile technique was used including antiseptics, cap, gloves, gown, hand hygiene, mask and sheet. Skin prep: Chlorhexidine; local anesthetic administered 20 gauge catheter was inserted into left femoral artery using the Seldinger technique.  Evaluation Blood flow good; BP tracing good. Complications: No apparent complications.   Hayden Pedro, AGACNP-BC [facility]  Pgr: [phone]  PCCM Pgr: [phone]  I, Dr Seward Carol Agree with the above note by NP Pt required Femoral arterial lime for hemodynamic monitoring and frequent blood draws for ABGs Consent was obtained, Time out performed and maximal barrier precautions with sterile technique used for line insertion. No complications occurred. I was present during the above procedure Good waveform and blood flow noted.  Dr. Seward Carol Pulmonary Critical Care Medicine Locums

## 2017-01-29 NOTE — Progress Notes (Signed)
eLink Physician-Brief Progress Note Patient Name: MICK TANGUMA DOB: 04-19-1936 MRN: [id number]   Date of Service  01/29/2017  HPI/Events of Note  Patient currently on standard sliding scale insulin. Consistent hyperglycemia.   eICU Interventions  1. Continuing Accu-Cheks every 4 hours 2. Switching to moderate dose sliding scale insulin per algorithm      Intervention Category Major Interventions: Hyperglycemia - active titration of insulin therapy  Tera Partridge 01/29/2017, 1:49 AM

## 2017-01-29 NOTE — Consult Note (Addendum)
81 year old male with PMH of CLL, s/p tumor lysis syndrome following chemotherapy for WBC >200,000 in Jan 2018, requiring acute hemodialysis x 2 for AKI and hyperkalemia to 7.4 meq/L and bradycardia. Creat was 2.28 at discharge in Jan . Creat was 2.14 on 06/19/16. On 05/09/16 creat was 2.1 so pt has CKD 3.  Renal ultrasound in Jan 2018 revealed a 8.3cm right and 8.1cm left kidney.  Patient received another round of chemotherapy day of admit, and per ED note had a syncopal episode at home while sitting, was treated at home with CPR by his son, then EMS, he awakened was brought to [facility] ED and again had LOC, no pulse and treated again with CPR plus epi and atropine and ext pacing. Pt transferred to [facility].  During  chemotherapy pt felt "hot, sweaty and nauseated" per RN notes.    WBC was 227K and creat 2.66 pre treatment. Uric acid was 2.5 and he was receiving allopurinol. Later in day at ED, WBC was 149K, creat 3.34, K 6.0 and CO2 9.  PH 7.18, pCo2 40, pO2 133.  Pt was treated with meds for hyperkalemia, mechanical ventilation and IV bicarb. I discussed case with Dr Ashok Cordia and suggested repeat labs on bicarb treatment before CRRT intervention.  Dr Gilford Raid subsequent requested urgent CRRT after an apparent repeat resuscitation.  Subsequent labs returned ph 7.31, K 5.3(verbal report) and he made 850cc of urine.  BP stable off pressors.    Past Medical History:  Diagnosis Date  . Anemia   . B12 deficiency 12/07/2014  . CLL (chronic lymphocytic leukemia) ([place]) 01/24/2011  . Diabetes mellitus   . DM (diabetes mellitus) ([place]) 01/24/2011  . Eczema 01/24/2011  . History of pneumonia 04/2014  . Hypertension   . Leukemia ([place]) 6.22.2012   PER PATIENT  . Melanoma in situ ([place]) 01/24/2011  . Mild obesity 01/24/2011  . Port catheter in place 05/02/2012  . Shingles   . Ulcer    Past Surgical History:  Procedure Laterality Date  . bleeding ulcer    . CATARACT EXTRACTION W/PHACO  05/05/2012   Procedure: CATARACT  EXTRACTION PHACO AND INTRAOCULAR LENS PLACEMENT (IOC);  Surgeon: Tonny Branch, MD;  Location: [facility] ORS;  Service: Ophthalmology;  Laterality: Right;  CDE:13.25  . CATARACT EXTRACTION W/PHACO  05/15/2012   Procedure: CATARACT EXTRACTION PHACO AND INTRAOCULAR LENS PLACEMENT (IOC);  Surgeon: Tonny Branch, MD;  Location: [facility] ORS;  Service: Ophthalmology;  Laterality: Left;  CDE:  12.32  . COLONOSCOPY N/A 01/30/2016   Procedure: COLONOSCOPY;  Surgeon: Danie Binder, MD;  Location: [facility] ENDO SUITE;  Service: Endoscopy;  Laterality: N/A;  2:00 PM - moved to 1:00 - office notified pt  . ESOPHAGOGASTRODUODENOSCOPY  2008   Dr. Oneida Alar: normal esophagus, antral erythema, 1 cm clean based duodenal ulcera, negative H. plyori  . ESOPHAGOGASTRODUODENOSCOPY N/A 01/30/2016   Procedure: ESOPHAGOGASTRODUODENOSCOPY (EGD);  Surgeon: Danie Binder, MD;  Location: [facility] SU[facility];  Service: Endoscopy;  Laterality: N/A;  . HERNIA REPAIR  2001  . PORTACATH PLACEMENT  2008   Social History:  reports that he has quit smoking. He quit smokeless tobacco use about 49 years ago. He reports that he does not drink alcohol or use drugs. Allergies:  Allergies  Allergen Reactions  . Aspirin Other (See Comments)    Bleeding ulcers.   . Ibuprofen Other (See Comments)    Bleeding ulcers.   Family History  Problem Relation Age of Onset  . Diabetes Father   .  Colon cancer Neg Hx     Medications:  Prior to Admission:  Prescriptions Prior to Admission  Medication Sig Dispense Refill Last Dose  . acetaminophen (TYLENOL) 500 MG tablet Take 500 mg by mouth at bedtime as needed for mild pain or moderate pain.    01/27/2017 at Unknown time  . acyclovir (ZOVIRAX) 400 MG tablet Take 1 tablet (400 mg total) by mouth daily. 30 tablet 5 01/28/2017 at Unknown time  . allopurinol (ZYLOPRIM) 300 MG tablet Take 1 tablet (300 mg total) by mouth daily. 30 tablet 0 01/28/2017 at Unknown time  . chlorambucil (LEUKERAN) 2 MG tablet Take 32 mg PO (16 tablets)  on Day 1 and 15 of each 28 day cycle of chemotherapy.  Give on an empty stomach 1 hour before or 2 hours after meals. 64 tablet 0 01/28/2017 at Unknown time  . cholecalciferol (VITAMIN D) 400 units TABS tablet Take 400 Units by mouth daily.   01/27/2017 at Unknown time  . lidocaine-prilocaine (EMLA) cream Apply a quarter size amount to affected area 1 hour prior to coming to chemotherapy.  Do not rub in.  Cover with plastic wrap. 30 g 2 01/28/2017 at Unknown time  . Obinutuzumab (GAZYVA IV) Inject into the vein as directed.    01/28/2017 at Unknown time  . ondansetron (ZOFRAN) 8 MG tablet Take 1 tablet (8 mg total) by mouth every 8 (eight) hours as needed for nausea or vomiting. 30 tablet 2 01/28/2017 at Unknown time  . prochlorperazine (COMPAZINE) 10 MG tablet Take 1 tablet (10 mg total) by mouth every 6 (six) hours as needed for nausea or vomiting. 30 tablet 2 01/28/2017 at Unknown time  . rosuvastatin (CRESTOR) 40 MG tablet Take 40 mg by mouth at bedtime.    01/27/2017 at Unknown time  . sevelamer carbonate (RENVELA) 800 MG tablet Take 2 tablets (1,600 mg total) by mouth 3 (three) times daily with meals. (Patient taking differently: Take 800 mg by mouth 3 (three) times daily with meals. ) 180 tablet 2 01/28/2017 at Unknown time  . sitaGLIPtin (JANUVIA) 100 MG tablet Take 100 mg by mouth daily.   01/28/2017 at Unknown time  . sodium bicarbonate 650 MG tablet TAKE 1 TABLET BY MOUTH TWICE A DAY AS DIRECTED  3 01/28/2017 at Unknown time  . sulfamethoxazole-trimethoprim (BACTRIM DS,SEPTRA DS) 800-160 MG tablet Take 1 tablet by mouth 3 (three) times a week. 12 tablet 5 01/28/2017 at Unknown time   Scheduled: . insulin aspart  0-15 Units Subcutaneous Q4H  . pantoprazole (PROTONIX) IV  40 mg Intravenous QHS   Continuous: . sodium chloride    . heparin    . dialysate (PRISMASATE)    . sodium bicarbonate (isotonic) 1000 mL infusion    . sodium bicarbonate (isotonic) 1000 mL infusion      ROS: Not  obtainable Blood pressure 134/63, pulse 63, temperature 98.3 F (36.8 C), temperature source Oral, resp. rate (!) 26, height 5' 1"  (1.549 m), weight 68.3 kg (150 lb 9.2 oz), SpO2 98 %.  General appearance: sedated on vent Head: Normocephalic, without obvious abnormality, atraumatic, stubbled hair Nose: Nares normal. Septum midline. Mucosa normal. No drainage or sinus tenderness. Throat: oral intubation Resp: clear to auscultation bilaterally Chest wall: no tenderness Cardio: regular rate and rhythm, S1, S2 normal, no murmur, click, rub or gallop GI: soft, non-tender; bowel sounds normal; no masses,  no organomegaly Extremities: edema 1+ Skin: pale Neurologic: Unrepsonsive Results for orders placed or performed during the hospital encounter of  01/28/17 (from the past 48 hour(s))  Urinalysis, Routine w reflex microscopic     Status: Abnormal   Collection Time: 01/28/17  6:57 PM  Result Value Ref Range   Color, Urine STRAW (A) YELLOW   APPearance CLEAR CLEAR   Specific Gravity, Urine 1.006 1.005 - 1.030   pH 6.0 5.0 - 8.0   Glucose, UA NEGATIVE NEGATIVE mg/dL   Hgb urine dipstick NEGATIVE NEGATIVE   Bilirubin Urine NEGATIVE NEGATIVE   Ketones, ur NEGATIVE NEGATIVE mg/dL   Protein, ur 100 (A) NEGATIVE mg/dL   Nitrite NEGATIVE NEGATIVE   Leukocytes, UA NEGATIVE NEGATIVE   RBC / HPF NONE SEEN 0 - 5 RBC/hpf   WBC, UA 0-5 0 - 5 WBC/hpf   Bacteria, UA RARE (A) NONE SEEN   Squamous Epithelial / LPF 0-5 (A) NONE SEEN  Basic metabolic panel     Status: Abnormal   Collection Time: 01/28/17  7:49 PM  Result Value Ref Range   Sodium 138 135 - 145 mmol/L   Potassium 6.0 (H) 3.5 - 5.1 mmol/L    Comment: DELTA CHECK NOTED   Chloride 108 101 - 111 mmol/L   CO2 9 (L) 22 - 32 mmol/L   Glucose, Bld 360 (H) 65 - 99 mg/dL   BUN 38 (H) 6 - 20 mg/dL   Creatinine, Ser 3.34 (H) 0.61 - 1.24 mg/dL   Calcium 7.7 (L) 8.9 - 10.3 mg/dL   GFR calc non Af Amer 16 (L) >60 mL/min   GFR calc Af Amer 19 (L)  >60 mL/min    Comment: (NOTE) The eGFR has been calculated using the CKD EPI equation. This calculation has not been validated in all clinical situations. eGFR's persistently <60 mL/min signify possible Chronic Kidney Disease.    Anion gap 21 (H) 5 - 15  CBC     Status: Abnormal   Collection Time: 01/28/17  7:49 PM  Result Value Ref Range   WBC 149.9 (HH) 4.0 - 10.5 K/uL    Comment: WHITE COUNT CONFIRMED ON SMEAR RESULT REPEATED AND VERIFIED CRITICAL RESULT CALLED TO, READ BACK BY AND VERIFIED WITH: TALBOT,T @ 2015 BY MATTHEWS,B 9.17.18    RBC 2.14 (L) 4.22 - 5.81 MIL/uL   Hemoglobin 7.9 (L) 13.0 - 17.0 g/dL   HCT 26.3 (L) 39.0 - 52.0 %   MCV 122.9 (H) 78.0 - 100.0 fL   MCH 36.9 (H) 26.0 - 34.0 pg   MCHC 30.0 30.0 - 36.0 g/dL   RDW 16.3 (H) 11.5 - 15.5 %   Platelets 98 (L) 150 - 400 K/uL    Comment: SPECIMEN CHECKED FOR CLOTS PLATELET COUNT CONFIRMED BY SMEAR   Troponin I     Status: Abnormal   Collection Time: 01/28/17  8:11 PM  Result Value Ref Range   Troponin I 0.03 (HH) <0.03 ng/mL    Comment: CRITICAL RESULT CALLED TO, READ BACK BY AND VERIFIED WITH: TALBET,T AT 2050 ON 9.17.2018 BY ISLEY,B   Uric acid     Status: Abnormal   Collection Time: 01/28/17  8:11 PM  Result Value Ref Range   Uric Acid, Serum <0.5 (L) 4.4 - 7.6 mg/dL  Lactic acid, plasma     Status: Abnormal   Collection Time: 01/28/17  8:11 PM  Result Value Ref Range   Lactic Acid, Venous 9.2 (HH) 0.5 - 1.9 mmol/L    Comment: CRITICAL RESULT CALLED TO, READ BACK BY AND VERIFIED WITH: PRUITT,G AT 2300 ON 9.17.2018 BY ISLEY,B   Phosphorus  Status: Abnormal   Collection Time: 01/28/17  8:11 PM  Result Value Ref Range   Phosphorus 7.6 (H) 2.5 - 4.6 mg/dL  Magnesium     Status: None   Collection Time: 01/28/17  8:11 PM  Result Value Ref Range   Magnesium 2.4 1.7 - 2.4 mg/dL  Blood gas, arterial     Status: Abnormal   Collection Time: 01/29/17 12:55 AM  Result Value Ref Range   FIO2 100.00    pH,  Arterial 7.188 (LL) 7.350 - 7.450    Comment: CORRECTED ON 09/18 AT 0105: PREVIOUSLY REPORTED AS 7.188 CRITICAL RESULT CALLED TO, READ BACK BY AND VERIFIED WITH:  CARSEN AGEE RRT AT [place] RRT ON 01/29/2017   pCO2 arterial 40.2 32.0 - 48.0 mmHg   pO2, Arterial 133 (H) 83.0 - 108.0 mmHg   Bicarbonate 14.7 (L) 20.0 - 28.0 mmol/L   Acid-base deficit 12.0 (H) 0.0 - 2.0 mmol/L   O2 Saturation 97.7 %   Patient temperature 98.6    Allens test (pass/fail) PASS PASS  Lactic acid, plasma     Status: Abnormal   Collection Time: 01/29/17  1:00 AM  Result Value Ref Range   Lactic Acid, Venous 5.7 (HH) 0.5 - 1.9 mmol/L    Comment: CRITICAL RESULT CALLED TO, READ BACK BY AND VERIFIED WITH: CLIFTON J,RN 01/29/17 0159 WAYK   Glucose, capillary     Status: Abnormal   Collection Time: 01/29/17  1:45 AM  Result Value Ref Range   Glucose-Capillary 393 (H) 65 - 99 mg/dL   Comment 1 Capillary Specimen   MRSA PCR Screening     Status: None   Collection Time: 01/29/17  2:25 AM  Result Value Ref Range   MRSA by PCR NEGATIVE NEGATIVE    Comment:        The GeneXpert MRSA Assay (FDA approved for NASAL specimens only), is one component of a comprehensive MRSA colonization surveillance program. It is not intended to diagnose MRSA infection nor to guide or monitor treatment for MRSA infections.   I-STAT 3, arterial blood gas (G3+)     Status: Abnormal   Collection Time: 01/29/17  3:29 AM  Result Value Ref Range   pH, Arterial 7.313 (L) 7.350 - 7.450   pCO2 arterial 34.1 32.0 - 48.0 mmHg   pO2, Arterial 128.0 (H) 83.0 - 108.0 mmHg   Bicarbonate 17.3 (L) 20.0 - 28.0 mmol/L   TCO2 18 (L) 22 - 32 mmol/L   O2 Saturation 99.0 %   Acid-base deficit 8.0 (H) 0.0 - 2.0 mmol/L   Patient temperature 98.6 F    Collection site ARTERIAL LINE    Drawn by RT    Sample type ARTERIAL    Dg Chest Port 1 View  Result Date: 01/29/2017 CLINICAL DATA:  Orogastric tube placement and central line placement  EXAM: PORTABLE CHEST 1 VIEW COMPARISON:  01/28/2017 FINDINGS: Endotracheal tube has been inserted and the tip is about 4.7 cm superior to the carina. Right-sided central venous catheter tip overlies the SVC. Left jugular venous catheter tips overlie a the brachiocephalic confluence. No pneumothorax is seen. Borderline cardiomegaly. Development of patchy right greater than left lower lung opacity. IMPRESSION: 1. Endotracheal tube tip about 4.7 cm superior to carina. Left-sided central venous catheter tips overlie the brachiocephalic confluence. No pneumothorax 2. Interim development of right greater than left lower lung airspace disease which may reflect pneumonia or asymmetric edema. Electronically Signed   By: Madie Reno.D.  On: 01/29/2017 01:25   Dg Chest Port 1 View  Result Date: 01/28/2017 CLINICAL DATA:  Dyspnea, pt has external pacer pad and c-pap mask EXAM: PORTABLE CHEST 1 VIEW COMPARISON:  Chest x-ray dated 08/24/2014. FINDINGS: Heart size and mediastinal contours are within normal limits given patient's oblique positioning. Pacer pad overlies the heart. Lungs are clear. No pleural effusion or pneumothorax seen. No acute or suspicious osseous finding. IMPRESSION: No active disease.  No evidence of pneumonia or pulmonary edema. Electronically Signed   By: Franki Cabot M.D.   On: 01/28/2017 20:02     Assessment:  1 AKI, hemodynamically mediated, due to chemo induced SIRS, less likely TLS 2 CKD 3  3 Met Acidosis, improved 4 Hyperkalemia, improved 5 CLL 6 Shock, due to SIRS improved 7 s/p cardiac arrest  Plan: Conservative supportive medical therapy No dialytic intervention indicated at this time Cont IV bicarb Keep off Bactrim as OP  Estanislado Emms 01/29/2017, 4:36 AM

## 2017-01-29 NOTE — Progress Notes (Signed)
Per Dr. Mercy Moore, Pre bag changed to 0/2.5 @ 553ml/hr and dialysate changed to 0/2.5 as well with the same rate of 2037ml/hr.

## 2017-01-29 NOTE — Progress Notes (Signed)
K 6.3.  Dr. Joelyn Oms notified, order given to change post filter to 0/2.5 at 500 ml/hr and to check BMP in 4 hours.

## 2017-01-29 NOTE — Progress Notes (Signed)
[facility] Progress Note Patient Name: Randall Reyes DOB: 01-Jun-1935 MRN: [id number]   Date of Service  01/29/2017  HPI/Events of Note  Patient went into asystole cardiac arrest. CPR was initiated and bolus epinephrine was initiated. Verbal order for calcium chloride and bolus bicarbonate given via camera. Patient regained spontaneous circulation. Lactic acid noted as previously drawn and down trending.   eICU Interventions  1. Continuing bicarbonate drip 2. Awaiting repeat labs in 3 AM 3. Ordering arterial line placement for frequent ABGs and close blood pressure monitoring      Intervention Category Major Interventions: Code management / supervision  Tera Partridge 01/29/2017, 2:03 AM

## 2017-01-29 NOTE — Procedures (Signed)
ELECTROENCEPHALOGRAM REPORT  Date of Study: 01/29/2017  Patient's Name: Randall Reyes MRN: [id number] Date of Birth: February 11, 1936  Referring Provider: Dr. Merrie Roof  Clinical History: This is a 81 year old man with twitching movements, EEG to assess for subclinical seizures.  Medications: levETIRAcetam (KEPPRA) 1,000 mg in sodium chloride 0.9 % 100 mL IVPB  fentaNYL (SUBLIMAZE) injection 50 mcg  insulin aspart (novoLOG) injection 0-20 Units  meropenem (MERREM) 1 g in sodium chloride 0.9 % 100 mL IVPB  norepinephrine (LEVOPHED) 4 mg in dextrose 5 % 250 mL (0.016 mg/mL) infusion  pantoprazole (PROTONIX) injection 40 mg  prismasol BGK 4/2.5 5,000 mL dialysis solution  sodium bicarbonate 150 mEq in sterile water 1,000 mL infusion  vancomycin (VANCOCIN) IVPB 750 mg/150 ml premix   Technical Summary: A multichannel digital EEG recording measured by the international 10-20 system with electrodes applied with paste and impedances below 5000 ohms performed as portable with EKG monitoring in an awake and asleep patient.  Hyperventilation and photic stimulation were not performed.  The digital EEG was referentially recorded, reformatted, and digitally filtered in a variety of bipolar and referential montages for optimal display.   Description: The patient is awake and asleep during the recording. He is intubated and received IV Fentanyl at 13 minutes into the recording. During maximal wakefulness, there is a symmetric, medium voltage 7 Hz posterior dominant rhythm that poorly attenuates with eye opening and eye closure. This is admixed with a moderate amount of diffuse 4-5 Hz theta and 2-3 Hz delta slowing of the waking background.  During drowsiness and sleep, there is an increase in theta and delta slowing of the background with poorly formed vertex waves seen.  Hyperventilation and photic stimulation were not performed.  There were no epileptiform discharges or electrographic seizures  seen.    EKG lead showed bradycardia at 44 bpm.  Impression: This awake and asleep EEG is abnormal due to moderate diffuse slowing of the waking background.  Clinical Correlation of the above findings indicates diffuse cerebral dysfunction that is non-specific in etiology and can be seen with hypoxic/ischemic injury, toxic/metabolic encephalopathies, or medication effect.  There were no subclinical seizures in this study. The absence of epileptiform discharges does not rule out a clinical diagnosis of epilepsy.  Clinical correlation is advised.   Ellouise Newer, M.D.

## 2017-01-29 NOTE — Progress Notes (Signed)
Admit: 01/28/2017 LOS: 1  62M with cardiac arrest / VDRF after CTX for CLL, mild hyperkalemia, met acidosis  Subjective:  Intubated on 80% FIO2 Off pressors Decent UOP Last K 5.3 Uric Acid low  09/17 0701 - 09/18 0700 In: 813.9 [I.V.:783.9; NG/GT:30] Out: 1025 [Urine:1025]  Filed Weights   01/28/17 1856 01/29/17 0400  Weight: 63.5 kg (140 lb) 68.3 kg (150 lb 9.2 oz)    Scheduled Meds: . chlorhexidine gluconate (MEDLINE KIT)  15 mL Mouth Rinse BID  . insulin aspart  0-15 Units Subcutaneous Q4H  . mouth rinse  15 mL Mouth Rinse QID  . pantoprazole (PROTONIX) IV  40 mg Intravenous QHS   Continuous Infusions: . sodium chloride 10 mL/hr at 01/29/17 0449  . heparin    .  sodium bicarbonate (isotonic) infusion in sterile water 150 mEq (01/29/17 0500)   PRN Meds:.Place/Maintain arterial line **AND** sodium chloride, fentaNYL (SUBLIMAZE) injection, fentaNYL (SUBLIMAZE) injection, heparin, heparin, midazolam, midazolam  Current Labs: reviewed    Physical Exam:  Blood pressure (!) 99/53, pulse (!) 51, temperature 98.8 F (37.1 C), temperature source Oral, resp. rate (!) 26, height 5' 1"  (1.549 m), weight 68.3 kg (150 lb 9.2 oz), SpO2 100 %. Intubated, not alert Coarse bs b/l Slow, IRIR No LEE FOley in place S/nt/nd  A 1. AoCKD after CTX for CLL with WBC > 200 2. Hyperkalemia, mild 3. Metabolic Acidosis on NaHCO3 gtt 4. CLL s/p CTX 9/17; appears rec rasburicase 5. VDRF 6. S/p Cardiac Arrest  P 1. Await AM labs 2. 0200 labs do not suggest CRRT would be that beneficial 3. WIll follow lcosely    Pearson Grippe MD 01/29/2017, 8:06 AM   Recent Labs Lab 01/28/17 0801 01/28/17 1949 01/28/17 2011 01/29/17 0223 01/29/17 0552  NA 137 138  --  144  --   K 4.8 6.0*  --  5.3*  --   CL 102 108  --  109  --   CO2 26 9*  --   --   --   GLUCOSE 154* 360*  --  407*  --   BUN 31* 38*  --  37*  --   CREATININE 2.66* 3.34*  --  2.90*  --   CALCIUM 8.6* 7.7*  --   --   --    PHOS 3.3  --  7.6*  --  6.2*    Recent Labs Lab 01/28/17 0801 01/28/17 1949 01/29/17 0223 01/29/17 0552  WBC 227.7* 149.9*  --  124.7*  NEUTROABS 4.6  --   --   --   HGB 8.8* 7.9* 7.8* 7.5*  HCT 27.3* 26.3* 23.0* 24.0*  MCV 116.7* 122.9*  --  116.5*  PLT 123* 98*  --  76*

## 2017-01-29 NOTE — Procedures (Signed)
Admit: 01/28/2017 LOS: 1  47M with cardiac arrest / VDRF after CTX for CLL, mild hyperkalemia, met acidosis  Current CRRT Prescription: Start Date: 01/29/17 Catheter: L IJ TDC 9/18 CCM BFR: 200 Pre Blood Pump: 1000 4K DFR: 1000 4K Replacement Rate: 500 4K Goal UF: even to 31m neg/hr Anticoagulation: none Clotting: none to date  S: K 6.3 on NaHCO3 gtt, HCO3 'normal'  O: 09/17 0701 - 09/18 0700 In: 813.9 [I.V.:783.9; NG/GT:30] Out: 1025 [Urine:1025]  Filed Weights   01/28/17 1856 01/29/17 0400  Weight: 63.5 kg (140 lb) 68.3 kg (150 lb 9.2 oz)     Recent Labs Lab 01/28/17 0801 01/28/17 1949 01/28/17 2011 01/29/17 0223 01/29/17 0552 01/29/17 0759  NA 137 138  --  144  --  139  K 4.8 6.0*  --  5.3*  --  6.3*  CL 102 108  --  109  --  108  CO2 26 9*  --   --   --  20*  GLUCOSE 154* 360*  --  407*  --  308*  BUN 31* 38*  --  37*  --  41*  CREATININE 2.66* 3.34*  --  2.90*  --  3.37*  CALCIUM 8.6* 7.7*  --   --   --  7.4*  PHOS 3.3  --  7.6*  --  6.2*  --     Recent Labs Lab 01/28/17 0801 01/28/17 1949 01/29/17 0223 01/29/17 0552  WBC 227.7* 149.9*  --  124.7*  NEUTROABS 4.6  --   --   --   HGB 8.8* 7.9* 7.8* 7.5*  HCT 27.3* 26.3* 23.0* 24.0*  MCV 116.7* 122.9*  --  116.5*  PLT 123* 98*  --  76*    Scheduled Meds: . chlorhexidine gluconate (MEDLINE KIT)  15 mL Mouth Rinse BID  . insulin aspart  0-20 Units Subcutaneous Q4H  . mouth rinse  15 mL Mouth Rinse QID  . pantoprazole (PROTONIX) IV  40 mg Intravenous QHS   Continuous Infusions: . sodium chloride 10 mL/hr at 01/29/17 0449  . heparin    . levETIRAcetam    . meropenem (MERREM) IV 1 g (01/29/17 1149)  . norepinephrine (LEVOPHED) Adult infusion 2.5 mcg/min (01/29/17 1008)  . dialysis replacement fluid (prismasate) 1,000 mL/hr at 01/29/17 1013  . dialysis replacement fluid (prismasate) 500 mL/hr at 01/29/17 1015  . dialysate (PRISMASATE) 1,000 mL/hr at 01/29/17 1013  .  sodium bicarbonate  (isotonic) infusion in sterile water Stopped (01/29/17 1125)  . sodium chloride 999 mL/hr at 01/29/17 1030  . vancomycin 750 mg (01/29/17 1152)   PRN Meds:.Place/Maintain arterial line **AND** sodium chloride, fentaNYL (SUBLIMAZE) injection, fentaNYL (SUBLIMAZE) injection, heparin, heparin, heparin, sodium chloride  ABG    Component Value Date/Time   PHART 7.439 01/29/2017 1113   PCO2ART 39.2 01/29/2017 1113   PO2ART 166.0 (H) 01/29/2017 1113   HCO3 26.5 01/29/2017 1113   TCO2 28 01/29/2017 1113   ACIDBASEDEF 8.0 (H) 01/29/2017 0329   O2SAT 100.0 01/29/2017 1113    A/P  1. Dialysis Dependent AoCKD after CTX for CLL with WBC > 200 2. Hyperkalemia, mild 3. Metabolic Acidosis on NaHCO3 gtt 4. CLL s/p CTX 9/17; appears rec rasburicase 5. VDRF 6. S/p Cardiac Arrest  1. Started CRRT for recurrent hyperkalemia after correcton of acidosis 2. Hopefully short term, minimize UF, still making urine 3. Follow closely; 4K bath, no Anticoagulation  RPearson Grippe MD [facility] pgr [phone]

## 2017-01-29 NOTE — Progress Notes (Signed)
Arterial line attmepted X's 2, another RRT attempted X's 2. Unsuccessful, MD notified, MD aware, stated she will place femoral line.

## 2017-01-29 NOTE — Procedures (Signed)
Intubation Procedure Note Randall Reyes [id number] 04-30-1936  Procedure: Intubation Indications: Respiratory insufficiency  Procedure Details Consent: EMERGENT CIRCUMSTANCE Time Out: Verified patient identification, verified procedure, site/side was marked, verified correct patient position, special equipment/implants available, medications/allergies/relevent history reviewed, required imaging and test results available.  Performed  Maximum sterile technique was used including cap, gloves and mask.  MAC and 3    Evaluation Hemodynamic Status: BP stable throughout; O2 sats: stable throughout Patient's Current Condition: stable Complications: No apparent complications Patient did tolerate procedure well. Chest X-ray ordered to verify placement.  CXR: tube position acceptable. ETT SIZE 7.5  23 AT LIP  Randall Reyes 01/29/2017

## 2017-01-29 NOTE — Procedures (Signed)
Central Venous Catheter Insertion Procedure Note [name] [id number] 06-21-1935  Procedure: Insertion of Central Venous Catheter Indications: Assessment of intravascular volume, Drug and/or fluid administration and Frequent blood sampling  Procedure Details Consent:  EMERGENT CIRCUMSTANCE.  Time Out: Verified patient identification, verified procedure, site/side was marked, verified correct patient position, special equipment/implants available, medications/allergies/relevent history reviewed, required imaging and test results available.  Performed  Maximum sterile technique was used including antiseptics, cap, gloves, gown, hand hygiene, mask and sheet. Skin prep: Chlorhexidine; local anesthetic administered A antimicrobial bonded/coated triple lumen catheter was placed in the left internal jugular vein using the Seldinger technique.  Evaluation Blood flow good Complications: No apparent complications Patient did tolerate procedure well. Chest X-ray ordered to verify placement.  CXR: normal. Catheter Length 20 Trialysis catheter  Rise Paganini Jakari Jacot 01/29/2017, 12:54 AM

## 2017-01-29 NOTE — Patient Instructions (Signed)
[facility] Discharge Instructions for Patients Receiving Chemotherapy   Beginning January 23rd 2017 lab work for the Cancer Center will be done in the  Main lab at [facility] on 1st floor. If you have a lab appointment with the Cancer Center please come in thru the  Main Entrance and check in at the main information desk   Today you received the following chemotherapy agents   To help prevent nausea and vomiting after your treatment, we encourage you to take your nausea medication     If you develop nausea and vomiting, or diarrhea that is not controlled by your medication, call the clinic.  The clinic phone number is [phone]. Office hours are Monday-Friday 8:30am-5:00pm.  BELOW ARE SYMPTOMS THAT SHOULD BE REPORTED IMMEDIATELY:  *FEVER GREATER THAN 101.0 F  *CHILLS WITH OR WITHOUT FEVER  NAUSEA AND VOMITING THAT IS NOT CONTROLLED WITH YOUR NAUSEA MEDICATION  *UNUSUAL SHORTNESS OF BREATH  *UNUSUAL BRUISING OR BLEEDING  TENDERNESS IN MOUTH AND THROAT WITH OR WITHOUT PRESENCE OF ULCERS  *URINARY PROBLEMS  *BOWEL PROBLEMS  UNUSUAL RASH Items with * indicate a potential emergency and should be followed up as soon as possible. If you have an emergency after office hours please contact your primary care physician or go to the nearest emergency department.  Please call the clinic during office hours if you have any questions or concerns.   You may also contact the Patient Navigator at [phone] should you have any questions or need assistance in obtaining follow up care.      Resources For Cancer Patients and their Caregivers ? American Cancer Society: Can assist with transportation, wigs, general needs, runs Look Good Feel Better.        1-888-227-6333 ? Cancer Care: Provides financial assistance, online support groups, medication/co-pay assistance.  1-800-813-HOPE (4673) ? [facility] Assists [place] cancer  patients and their families through emotional , educational and financial support.  336-427-4357 ? [place] DSS Where to apply for food stamps, Medicaid and utility assistance. 336-342-1394 ? [facility]: Transportation to medical appointments. 336-347-2287 ? Social Security Administration: May apply for disability if have a Stage IV cancer. 336-342-7796 1-800-772-1213 ? [place] Aging, Disability and Transit Services: Assists with nutrition, care and transit needs. 336-349-2343         

## 2017-01-29 NOTE — Progress Notes (Signed)
Pharmacy Antibiotic Note  Randall Reyes is a 81 y.o. male admitted on 01/28/2017 with sepsis.  Pharmacy has been consulted for vancomycin dosing.  CXR shows RLL>LLL opacity concerning for HCAP in setting of immunocompromised host,  with AKI on CKD3 with baseline SCr 2-2.2 now up to 3.34 and pt being transitioned to CRRT at this time.     Plan: Vancomycin 750mg  IV every 24 hours Meropenem 1g IV every 12 hours Monitor clinical progress, LOT, f/u Cx to narrow as possible F/u time on CRRT and further renal plans, adjust abx dosing accordingly   Height: 5\' 1"  (154.9 cm) Weight: 150 lb 9.2 oz (68.3 kg) IBW/kg (Calculated) : 52.3  Temp (24hrs), Avg:98.5 F (36.9 C), Min:97.6 F (36.4 C), Max:99.3 F (37.4 C)   Recent Labs Lab 01/28/17 0801 01/28/17 1949 01/28/17 2011 01/29/17 0100 01/29/17 0223 01/29/17 0428 01/29/17 0552 01/29/17 0759  WBC 227.7* 149.9*  --   --   --   --  124.7*  --   CREATININE 2.66* 3.34*  --   --  2.90*  --   --  3.37*  LATICACIDVEN  --   --  9.2* 5.7*  --  4.7*  --   --     Estimated Creatinine Clearance: 14.8 mL/min (A) (by C-G formula based on SCr of 3.37 mg/dL (H)).    Allergies  Allergen Reactions  . Aspirin Other (See Comments)    Bleeding ulcers.   . Ibuprofen Other (See Comments)    Bleeding ulcers.    Antimicrobials this admission: Vanc 9/18> Meropenem 9/18>  Dose adjustments this admission: n/a  Microbiology results: BCx 9/18> sent Respiratory Cx 9/18> sent MRSA PCR negative  Bertis Ruddy, PharmD Pharmacy Resident Pager #: [phone] 01/29/2017 11:26 AM

## 2017-01-29 NOTE — Progress Notes (Addendum)
K 6.2, Dr. Joelyn Oms notified.  Telephone order to increase dialysate to 2000 ml/hr and PBP to 1500 ml/hr.

## 2017-01-29 NOTE — Progress Notes (Signed)
  Echocardiogram 2D Echocardiogram has been performed.  Randall Reyes Randall Reyes 01/29/2017, 1:57 PM

## 2017-01-29 NOTE — Progress Notes (Signed)
eLink Physician-Brief Progress Note Patient Name: Randall Reyes DOB: 1936-01-18 MRN: [id number]   Date of Service  01/29/2017  HPI/Events of Note  Contacted Dr. Florene Glen with nephrology. Made him aware of the consultation on this patient with hyperkalemia and arrest. Explained patient's worsening acidotic state which appears to be both metabolic with elevated lactic acid. Dopamine ordered for bradycardia. Also reviewed patient's worsening renal function. Other serum lab tests related including ABG. Dr. Florene Glen prefers to continue with bicarbonate infusion and repeat labs before deciding on renal replacement therapy. Patient currently breathing 18 times per minute on ventilator.   eICU Interventions  1. Increasing rate on ventilator to 26 2. Increasing bicarbonate drip rate to 150 mL/h 3. Repeat ABG and basic metabolic panel at 3 AM         Tera Partridge 01/29/2017, 1:27 AM

## 2017-01-30 ENCOUNTER — Other Ambulatory Visit (HOSPITAL_COMMUNITY): Payer: Medicare Other

## 2017-01-30 ENCOUNTER — Telehealth (HOSPITAL_COMMUNITY): Payer: Self-pay

## 2017-01-30 ENCOUNTER — Inpatient Hospital Stay (HOSPITAL_COMMUNITY): Payer: Medicare Other

## 2017-01-30 DIAGNOSIS — E872 Acidosis: Secondary | ICD-10-CM

## 2017-01-30 DIAGNOSIS — I469 Cardiac arrest, cause unspecified: Secondary | ICD-10-CM

## 2017-01-30 DIAGNOSIS — J9601 Acute respiratory failure with hypoxia: Secondary | ICD-10-CM

## 2017-01-30 LAB — BASIC METABOLIC PANEL
Anion gap: 6 (ref 5–15)
Anion gap: 12 (ref 5–15)
Anion gap: 6 (ref 5–15)
BUN: 25 mg/dL — ABNORMAL HIGH (ref 6–20)
BUN: 18 mg/dL (ref 6–20)
BUN: 18 mg/dL (ref 6–20)
CHLORIDE: 103 mmol/L (ref 101–111)
CHLORIDE: 103 mmol/L (ref 101–111)
CHLORIDE: 104 mmol/L (ref 101–111)
CO2: 26 mmol/L (ref 22–32)
CO2: 22 mmol/L (ref 22–32)
CO2: 25 mmol/L (ref 22–32)
CREATININE: 1.03 mg/dL (ref 0.61–1.24)
CREATININE: 0.93 mg/dL (ref 0.61–1.24)
Calcium: 7.3 mg/dL — ABNORMAL LOW (ref 8.9–10.3)
Calcium: 7.3 mg/dL — ABNORMAL LOW (ref 8.9–10.3)
Calcium: 7.5 mg/dL — ABNORMAL LOW (ref 8.9–10.3)
Creatinine, Ser: 1.29 mg/dL — ABNORMAL HIGH (ref 0.61–1.24)
GFR calc Af Amer: 59 mL/min — ABNORMAL LOW (ref >60)
GFR calc Af Amer: >60 mL/min (ref >60)
GFR calc non Af Amer: 51 mL/min — ABNORMAL LOW (ref >60)
GFR calc non Af Amer: >60 mL/min (ref >60)
Glucose, Bld: 130 mg/dL — ABNORMAL HIGH (ref 65–99)
Glucose, Bld: 127 mg/dL — ABNORMAL HIGH (ref 65–99)
Glucose, Bld: 132 mg/dL — ABNORMAL HIGH (ref 65–99)
POTASSIUM: 4.5 mmol/L (ref 3.5–5.1)
POTASSIUM: 4.2 mmol/L (ref 3.5–5.1)
POTASSIUM: 4.1 mmol/L (ref 3.5–5.1)
SODIUM: 135 mmol/L (ref 135–145)
Sodium: 135 mmol/L (ref 135–145)
Sodium: 137 mmol/L (ref 135–145)

## 2017-01-30 LAB — CBC
HCT: 22.7 % — ABNORMAL LOW (ref 39.0–52.0)
Hemoglobin: 7.3 g/dL — ABNORMAL LOW (ref 13.0–17.0)
MCH: 36 pg — AB (ref 26.0–34.0)
MCHC: 32.2 g/dL (ref 30.0–36.0)
MCV: 111.8 fL — ABNORMAL HIGH (ref 78.0–100.0)
PLATELETS: 35 10*3/uL — AB (ref 150–400)
RBC: 2.03 MIL/uL — ABNORMAL LOW (ref 4.22–5.81)
RDW: 15.7 % — ABNORMAL HIGH (ref 11.5–15.5)
WBC: 21.3 10*3/uL — ABNORMAL HIGH (ref 4.0–10.5)

## 2017-01-30 LAB — ECHOCARDIOGRAM COMPLETE
Height: 61 in
WEIGHTICAEL: 2409.19 [oz_av]

## 2017-01-30 LAB — GLUCOSE, CAPILLARY
GLUCOSE-CAPILLARY: 114 mg/dL — AB (ref 65–99)
GLUCOSE-CAPILLARY: 122 mg/dL — AB (ref 65–99)
GLUCOSE-CAPILLARY: 163 mg/dL — AB (ref 65–99)
Glucose-Capillary: 108 mg/dL — ABNORMAL HIGH (ref 65–99)
Glucose-Capillary: 118 mg/dL — ABNORMAL HIGH (ref 65–99)
Glucose-Capillary: 127 mg/dL — ABNORMAL HIGH (ref 65–99)
Glucose-Capillary: 123 mg/dL — ABNORMAL HIGH (ref 65–99)

## 2017-01-30 LAB — RENAL FUNCTION PANEL
ANION GAP: 6 (ref 5–15)
Albumin: 2.7 g/dL — ABNORMAL LOW (ref 3.5–5.0)
Albumin: 2.5 g/dL — ABNORMAL LOW (ref 3.5–5.0)
Albumin: 2.5 g/dL — ABNORMAL LOW (ref 3.5–5.0)
Anion gap: 8 (ref 5–15)
Anion gap: 4 — ABNORMAL LOW (ref 5–15)
BUN: 19 mg/dL (ref 6–20)
BUN: 18 mg/dL (ref 6–20)
BUN: 17 mg/dL (ref 6–20)
CALCIUM: 7.4 mg/dL — AB (ref 8.9–10.3)
CHLORIDE: 104 mmol/L (ref 101–111)
CHLORIDE: 105 mmol/L (ref 101–111)
CO2: 25 mmol/L (ref 22–32)
CO2: 25 mmol/L (ref 22–32)
CO2: 27 mmol/L (ref 22–32)
CREATININE: 1.24 mg/dL (ref 0.61–1.24)
CREATININE: 0.92 mg/dL (ref 0.61–1.24)
Calcium: 7.5 mg/dL — ABNORMAL LOW (ref 8.9–10.3)
Calcium: 7.6 mg/dL — ABNORMAL LOW (ref 8.9–10.3)
Chloride: 104 mmol/L (ref 101–111)
Creatinine, Ser: 0.93 mg/dL (ref 0.61–1.24)
GFR calc Af Amer: >60 mL/min (ref >60)
GFR calc non Af Amer: 54 mL/min — ABNORMAL LOW (ref >60)
GFR calc non Af Amer: >60 mL/min (ref >60)
GLUCOSE: 132 mg/dL — AB (ref 65–99)
Glucose, Bld: 115 mg/dL — ABNORMAL HIGH (ref 65–99)
Glucose, Bld: 113 mg/dL — ABNORMAL HIGH (ref 65–99)
POTASSIUM: 4.1 mmol/L (ref 3.5–5.1)
POTASSIUM: 4.1 mmol/L (ref 3.5–5.1)
Phosphorus: 5.1 mg/dL — ABNORMAL HIGH (ref 2.5–4.6)
Phosphorus: 4.1 mg/dL (ref 2.5–4.6)
Phosphorus: 3.8 mg/dL (ref 2.5–4.6)
Potassium: 3.9 mmol/L (ref 3.5–5.1)
SODIUM: 137 mmol/L (ref 135–145)
SODIUM: 135 mmol/L (ref 135–145)
Sodium: 136 mmol/L (ref 135–145)

## 2017-01-30 LAB — BLOOD GAS, ARTERIAL
Acid-Base Excess: 3 mmol/L — ABNORMAL HIGH (ref 0.0–2.0)
Bicarbonate: 26.6 mmol/L (ref 20.0–28.0)
Drawn by: 511911
FIO2: 0.5
O2 Saturation: 99.4 %
PATIENT TEMPERATURE: 97.4
PCO2 ART: 36.2 mmHg (ref 32.0–48.0)
PEEP: 10 cmH2O
PH ART: 7.476 — AB (ref 7.350–7.450)
RATE: 18 resp/min
VT: 550 mL
pO2, Arterial: 173 mmHg — ABNORMAL HIGH (ref 83.0–108.0)

## 2017-01-30 LAB — MAGNESIUM: MAGNESIUM: 2.3 mg/dL (ref 1.7–2.4)

## 2017-01-30 MED ORDER — SODIUM CHLORIDE 0.9 % IV SOLN
750.0000 mg | Freq: Two times a day (BID) | INTRAVENOUS | Status: DC
  Administered 2017-01-30 – 2017-02-04 (×11): 750 mg via INTRAVENOUS
  Filled 2017-01-30 (×12): qty 7.5

## 2017-01-30 MED ORDER — PRISMASOL BGK 4/2.5 32-4-2.5 MEQ/L IV SOLN
INTRAVENOUS | Status: DC
  Administered 2017-01-30: 16:00:00 via INTRAVENOUS_CENTRAL
  Filled 2017-01-30 (×6): qty 5000

## 2017-01-30 MED ORDER — PRISMASOL BGK 4/2.5 32-4-2.5 MEQ/L IV SOLN
INTRAVENOUS | Status: DC
  Administered 2017-01-30 – 2017-01-31 (×6): via INTRAVENOUS_CENTRAL
  Filled 2017-01-30 (×16): qty 5000

## 2017-01-30 MED ORDER — PRO-STAT SUGAR FREE PO LIQD
30.0000 mL | Freq: Every day | ORAL | Status: DC
  Administered 2017-01-30: 30 mL
  Filled 2017-01-30 (×2): qty 30

## 2017-01-30 MED ORDER — PRISMASOL BGK 4/2.5 32-4-2.5 MEQ/L IV SOLN
INTRAVENOUS | Status: DC
  Administered 2017-01-30 – 2017-01-31 (×4): via INTRAVENOUS_CENTRAL
  Filled 2017-01-30 (×7): qty 5000

## 2017-01-30 MED ORDER — CHLORHEXIDINE GLUCONATE CLOTH 2 % EX PADS
6.0000 | MEDICATED_PAD | Freq: Every day | CUTANEOUS | Status: DC
  Administered 2017-01-31 – 2017-02-04 (×3): 6 via TOPICAL

## 2017-01-30 MED ORDER — SODIUM CHLORIDE 0.9% FLUSH
10.0000 mL | INTRAVENOUS | Status: DC | PRN
  Administered 2017-02-05: 10 mL
  Filled 2017-01-30: qty 40

## 2017-01-30 MED ORDER — VITAL AF 1.2 CAL PO LIQD
1000.0000 mL | ORAL | Status: DC
  Administered 2017-01-30: 1000 mL
  Filled 2017-01-30 (×2): qty 1000

## 2017-01-30 NOTE — Progress Notes (Signed)
Initial Nutrition Assessment  DOCUMENTATION CODES:   Not applicable  INTERVENTION:    Vital AF 1.2 at 50 ml/h (1200 ml per day)  Pro-stat 30 ml once daily  Provides 1540 kcal, 105 gm protein, 973 ml free water daily  NUTRITION DIAGNOSIS:   Inadequate oral intake related to inability to eat as evidenced by NPO status.  GOAL:   Patient will meet greater than or equal to 90% of their needs  MONITOR:   Vent status, TF tolerance, Labs, I & O's  REASON FOR ASSESSMENT:   Rounds (start TF per discussion with MD)    ASSESSMENT:   81 yo male with PMH of anemia, CLL (received chemo treatment on day of admission), severe tumor lysis syndrome, DM, HTN, and CKD who was admitted on 9/17 after syncopal episode at home. He required CPR. Suspected to be in tumor lysis.   Patient is intubated. Discussed patient case with RN and MD. Hopeful for extubation within the next 24-48 hours. RD to order TF today. OGT in place. Currently requiring CRRT. Nutrition-Focused physical exam completed. Findings are no fat depletion, no muscle depletion, and mild edema.   Patient is currently intubated on ventilator support MV: 11.2 L/min Temp (24hrs), Avg:97 F (36.1 C), Min:94.5 F (34.7 C), Max:97.6 F (36.4 C)  Propofol: none  Labs reviewed: phosphorus 5.1 (H) CBG's: 122-108-118-127 Medications reviewed.  Diet Order:   NPO  Skin:  Reviewed, no issues  Last BM:  PTA  Height:   Ht Readings from Last 1 Encounters:  01/28/17 5\' 1"  (1.549 m)    Weight:   Wt Readings from Last 1 Encounters:  01/30/17 146 lb 9.7 oz (66.5 kg)    Ideal Body Weight:  50.9 kg  BMI:  Body mass index is 27.7 kg/m.  Estimated Nutritional Needs:   Kcal:  1515  Protein:  >/= 100 gm  Fluid:  1.5 L  EDUCATION NEEDS:   No education needs identified at this time  Molli Barrows, [place], [place], [place] Pager [phone] After Hours Pager [phone]

## 2017-01-30 NOTE — Progress Notes (Signed)
PULMONARY / CRITICAL CARE MEDICINE   Name: Randall Reyes MRN: [id number] DOB: [birth year]    ADMISSION DATE:  01/28/2017 CONSULTATION DATE:  01/28/2017  REFERRING MD:  Dr. Dayna Barker   CHIEF COMPLAINT:  Syncope   HISTORY OF PRESENT ILLNESS:   81 year old male with PMH of Anemia, CLL (diagnosed 2006, treated with FCR in 2010 -- only received 3 cycles due to prolonged pancytopenia, 2015 started on Imbruica, Treated with 1 cycle BR 06/21/4130- 4/40/1027 complicated by severe tumor lysis syndrome (despite allopurinol and rasburicase) requiring hospitalization and dialysis), DM, HTN  Presents to ED on 9/17 after syncopal episode at home requiring brief CPR, family reports that patient underwent chemotherapy treatment earlier in the day. Upon arrival to ED patient was noted to be alert, however during stay experienced another episode on unresponsiveness and was noted to be pulseless. After one round of CPR ROSC was achieved. WBC 149.9, K 6.0, Uric Acid <0.5. Patient transferred to [facility] for further management. PCCM asked to admit.     SUBJECTIVE:  Very awake and alert. Off pressors. Lytes corrected on CRRT. Will start TF but most likely will be extubated soon.   VITAL SIGNS: BP 124/70   Pulse (!) 46   Temp (!) 97.3 F (36.3 C) (Oral)   Resp 18   Ht 5\' 1"  (1.549 m)   Wt 146 lb 9.7 oz (66.5 kg)   SpO2 100%   BMI 27.70 kg/m   HEMODYNAMICS:    VENTILATOR SETTINGS: Vent Mode: PRVC FiO2 (%):  [40 %-70 %] 40 % Set Rate:  [18 bmp] 18 bmp Vt Set:  [550 mL] 550 mL PEEP:  [10 cmH20] 10 cmH20 Plateau Pressure:  [18 cmH20-22 cmH20] 21 cmH20  INTAKE / OUTPUT: I/O last 3 completed shifts: In: 2758.3 [I.V.:2038.3; NG/GT:60; IV Piggyback:660] Out: 2536 [UYQIH:4742; Other:1470]  PHYSICAL EXAMINATION: General:  Thin male in NAD on vent HEENT: ET to vent, NGT to drainage VZD:GLOVFIEPPIR Neuro: follows commands and is alert CV:HSR +murmur PULM: even/non-labored, lungs  bilaterally decreased JJ:OACZ, non-tender, bsx4 active  Extremities: warm/dry, *+ edema  Skin: no rashes or lesions   LABS:  BMET  Recent Labs Lab 01/29/17 2220 01/30/17 0210 01/30/17 0600  NA 135 135 137  K 6.0* 4.5 3.9  CL 102 103 104  CO2 28 26 25   BUN 26* 25* 19  CREATININE 1.46* 1.29* 1.24  GLUCOSE 126* 130* 115*    Electrolytes  Recent Labs Lab 01/28/17 2011 01/29/17 0552  01/29/17 1727 01/29/17 2220 01/30/17 0210 01/30/17 0600  CALCIUM  --   --   < > 7.3* 7.3* 7.3* 7.4*  MG 2.4 1.9  --   --   --   --  2.3  PHOS 7.6* 6.2*  --  5.6*  --   --  5.1*  < > = values in this interval not displayed.  CBC  Recent Labs Lab 01/28/17 1949 01/29/17 0223 01/29/17 0552 01/30/17 0600  WBC 149.9*  --  124.7* 21.3*  HGB 7.9* 7.8* 7.5* 7.3*  HCT 26.3* 23.0* 24.0* 22.7*  PLT 98*  --  76* 35*    Coag's No results for input(s): APTT, INR in the last 168 hours.  Sepsis Markers  Recent Labs Lab 01/29/17 0100 01/29/17 0428 01/29/17 1127  LATICACIDVEN 5.7* 4.7* 1.7    ABG  Recent Labs Lab 01/29/17 1113 01/29/17 1433 01/30/17 0429  PHART 7.439 7.444 7.476*  PCO2ART 39.2 40.2 36.2  PO2ART 166.0* 221.0* 173*    Liver  Enzymes  Recent Labs Lab 01/28/17 0801 01/29/17 1727 01/30/17 0600  AST 21  --   --   ALT 12*  --   --   ALKPHOS 112  --   --   BILITOT 0.5  --   --   ALBUMIN 3.8 2.8* 2.7*    Cardiac Enzymes  Recent Labs Lab 01/28/17 2011  TROPONINI 0.03*    Glucose  Recent Labs Lab 01/29/17 1311 01/29/17 1607 01/29/17 1943 01/30/17 0018 01/30/17 0355 01/30/17 0848  GLUCAP 129* 102* 110* 122* 108* 118*    Imaging Dg Chest Port 1 View  Result Date: 01/30/2017 CLINICAL DATA:  Respiratory failure EXAM: PORTABLE CHEST 1 VIEW COMPARISON:  01/29/2017 FINDINGS: Support devices are stable. Bilateral perihilar and lower lobe opacities are unchanged. Node visible effusions. Heart is borderline in size. IMPRESSION: Bilateral lower lobe  airspace opacities, unchanged. Electronically Signed   By: Rolm Baptise M.D.   On: 01/30/2017 08:28     STUDIES:  CXR 9/17 > No acute  CXR 9/18 > RLL opacity > LLL EEG 9/18 > 2d 9/18>>  CULTURES: Sputum 9/18 > BC 9/18 >  ANTIBIOTICS: Vanc  9/18 >  Merrem 9/18 >   SIGNIFICANT EVENTS: 9/17 > Presents to ED, multiple codes  LINES/TUBES: Right Chest Port > L IJ CVL 9/17 >  L femoral A line 9/18 >    DISCUSSION: 81 year old male with CLL presents to ED on 9/18 with syncopal event. K 6.0, WBC 149.9. Suspected to be in Tumor Lysis. CRRT started 9/18 with vast improvement. O2 needs minimal. May be able to extubate within 24 hours  ASSESSMENT / PLAN:  PULMONARY A: Respiratory Insufficiency - s/p intubation 9/18 Resp alkalosis Possible HCAP - CXR 9/18 with RLL > LLL opacity P:   Full vent support, wean as tolerated Bronchial hygiene  Send sputum culture Start empiric abx CXR in AM May be wean able within 24 hours once renal and HD status stabilized  CARDIOVASCULAR A:  Hypotension - shock? A.Fib Bradycardia  Aortic Regurgitation  G1DD (EF 55-60 on 06/19/16)  H/O HTN  P:  Cardiac Monitoring Off vasopressors 9/19 Can dc aline in near future if remains stable.   RENAL  Recent Labs Lab 01/29/17 2220 01/30/17 0210 01/30/17 0600  K 6.0* 4.5 3.9    Lab Results  Component Value Date   CREATININE 1.24 01/30/2017   CREATININE 1.29 (H) 01/30/2017   CREATININE 1.46 (H) 01/29/2017    A:   Hyperkalemia in setting of tumor lysis  Acute on Chronic KD stage 3 (base crt 2.0-2.2) s/p 3L NS in ED  AGMA P:   Nephrology following, CRRT 9/18   GASTROINTESTINAL A:   GI prophylaxis P:   NPO PPI  Start TF 9/19  HEMATOLOGIC A:   Tumor Lysis Syndrome  Chronic Lymphocytic Leukemia  -Began Obinutuzumab + Chlorambucil 9/17 with Rasburicase  H/O Complications/Poor tolerance with Chemotherapy  Chronic anemia and thrombocytopenia - due to chemo P:   uric acid  level 0.5 - no need additional rasburicase Will notify oncology Follow CBC  INFECTIOUS A:   Possible HCAP - CXR 9/18 with RLL > LLL opacity.  At risk MDR given chemo / immunocompromised host P:   Assess sputum culture, blood culture Started vanc / merrem on 9/18  ENDOCRINE CBG (last 3)   Recent Labs  01/30/17 0018 01/30/17 0355 01/30/17 0848  GLUCAP 122* 108* 118*     A:   DM   P:   Trend Glucose  SSI   NEUROLOGIC A:   Acute Encephalopathy - remains unresponsive AM 9/18 despite no sedation ? szs activity 9/18 and started on keppra P:   RASS goal: 0/-1 Fentanyl PRN  Dc versed   EEG with some changes 9/19 decrease keppra to 750 mg q12h while on CRRT, low threshold to dc keppra.  FAMILY  - Updates: Family updated 9/19.  Son and daughter in law updated 9/19.  - Inter-disciplinary family meet or Palliative Care meeting due by: 02/05/2017   CC time: 35 min.   Randall Reyes ACNP Maryanna Shape PCCM Pager [phone] till 3 pm If no answer page [phone] 01/30/2017, 9:36 AM

## 2017-01-30 NOTE — Procedures (Signed)
Admit: 01/28/2017 LOS: 2  59M with cardiac arrest / VDRF after CTX for CLL, mild hyperkalemia, met acidosis  Current CRRT Prescription: Start Date: 01/29/17 Catheter: L IJ TDC 9/18 CCM BFR: 200 Pre Blood Pump: 500 0K DFR: 2000 0K Replacement Rate: 500 0K Goal UF: even to 24m neg/hr Anticoagulation: none Clotting: none to date  S: All fluids changed ot 0K overnight, K 3.9 this AM Weened off NE this AM Good UOP 40% FIO2 Awake, alert, interactive this AM WBC 21 this AM  O: 09/18 0701 - 09/19 0700 In: 1944.4 [I.V.:1254.4; NG/GT:30; IV Piggyback:660] Out: 2520 [Urine:1050]  Filed Weights   01/28/17 1856 01/29/17 0400 01/30/17 0500  Weight: 63.5 kg (140 lb) 68.3 kg (150 lb 9.2 oz) 66.5 kg (146 lb 9.7 oz)     Recent Labs Lab 01/29/17 0552  01/29/17 1727 01/29/17 2220 01/30/17 0210 01/30/17 0600  NA  --   < > 137 135 135 137  K  --   < > 6.3* 6.0* 4.5 3.9  CL  --   < > 105 102 103 104  CO2  --   < > 25 28 26 25   GLUCOSE  --   < > 123* 126* 130* 115*  BUN  --   < > 32* 26* 25* 19  CREATININE  --   < > 2.00* 1.46* 1.29* 1.24  CALCIUM  --   < > 7.3* 7.3* 7.3* 7.4*  PHOS 6.2*  --  5.6*  --   --  5.1*  < > = values in this interval not displayed.  Recent Labs Lab 01/28/17 0801 01/28/17 1949 01/29/17 0223 01/29/17 0552 01/30/17 0600  WBC 227.7* 149.9*  --  124.7* 21.3*  NEUTROABS 4.6  --   --   --   --   HGB 8.8* 7.9* 7.8* 7.5* 7.3*  HCT 27.3* 26.3* 23.0* 24.0* 22.7*  MCV 116.7* 122.9*  --  116.5* 111.8*  PLT 123* 98*  --  76* 35*    Scheduled Meds: . chlorhexidine gluconate (MEDLINE KIT)  15 mL Mouth Rinse BID  . insulin aspart  0-20 Units Subcutaneous Q4H  . mouth rinse  15 mL Mouth Rinse QID  . pantoprazole (PROTONIX) IV  40 mg Intravenous QHS   Continuous Infusions: . sodium chloride 10 mL/hr at 01/29/17 0449  . heparin    . levETIRAcetam Stopped (01/29/17 2212)  . meropenem (MERREM) IV Stopped (01/29/17 2251)  . norepinephrine (LEVOPHED) Adult  infusion Stopped (01/30/17 0747)  . dialysis replacement fluid (prismasate) 500 mL/hr at 01/30/17 0513  . dialysis replacement fluid (prismasate) 500 mL/hr at 01/29/17 2307  . dialysate (PRISMASATE) 2,000 mL/hr at 01/30/17 0640  .  sodium bicarbonate (isotonic) infusion in sterile water Stopped (01/29/17 1125)  . sodium chloride 999 mL/hr at 01/29/17 1030  . vancomycin Stopped (01/29/17 1252)   PRN Meds:.Place/Maintain arterial line **AND** sodium chloride, fentaNYL (SUBLIMAZE) injection, fentaNYL (SUBLIMAZE) injection, heparin, heparin, heparin, sodium chloride  ABG    Component Value Date/Time   PHART 7.476 (H) 01/30/2017 0429   PCO2ART 36.2 01/30/2017 0429   PO2ART 173 (H) 01/30/2017 0429   HCO3 26.6 01/30/2017 0429   TCO2 29 01/29/2017 1433   ACIDBASEDEF 8.0 (H) 01/29/2017 0329   O2SAT 99.4 01/30/2017 0429    A/P  1. Dialysis Dependent AoCKD after CTX for CLL with WBC > 200 2. Hyperkalemia, mild 3. Metabolic Acidosis on NaHCO3 gtt 4. CLL s/p CTX 9/17; appears rec rasburicase 5. VDRF 6. S/p Cardiac Arrest  1. Change Pre and Dialysate back to 4K, need to keep nl K prior to DC of CRRT 2. Follow q6 BMP 3. No UF, good uOP  Randall Grippe, MD [employer] pgr [phone]

## 2017-01-30 NOTE — Telephone Encounter (Signed)
24 hour follow up -note that patient is an inpatient at [facility] at this time.

## 2017-01-31 ENCOUNTER — Ambulatory Visit (HOSPITAL_COMMUNITY): Payer: Medicare Other

## 2017-01-31 ENCOUNTER — Other Ambulatory Visit (HOSPITAL_COMMUNITY): Payer: Medicare Other

## 2017-01-31 DIAGNOSIS — Z452 Encounter for adjustment and management of vascular access device: Secondary | ICD-10-CM

## 2017-01-31 DIAGNOSIS — E875 Hyperkalemia: Secondary | ICD-10-CM

## 2017-01-31 DIAGNOSIS — D649 Anemia, unspecified: Secondary | ICD-10-CM

## 2017-01-31 DIAGNOSIS — Z0189 Encounter for other specified special examinations: Secondary | ICD-10-CM

## 2017-01-31 LAB — CULTURE, RESPIRATORY W GRAM STAIN: Culture: NORMAL

## 2017-01-31 LAB — RENAL FUNCTION PANEL
ALBUMIN: 2.7 g/dL — AB (ref 3.5–5.0)
Albumin: 2.6 g/dL — ABNORMAL LOW (ref 3.5–5.0)
Anion gap: 6 (ref 5–15)
Anion gap: 4 — ABNORMAL LOW (ref 5–15)
BUN: 22 mg/dL — ABNORMAL HIGH (ref 6–20)
BUN: 28 mg/dL — ABNORMAL HIGH (ref 6–20)
CALCIUM: 7.1 mg/dL — AB (ref 8.9–10.3)
CHLORIDE: 104 mmol/L (ref 101–111)
CO2: 25 mmol/L (ref 22–32)
CO2: 26 mmol/L (ref 22–32)
CREATININE: 1.4 mg/dL — AB (ref 0.61–1.24)
Calcium: 7.3 mg/dL — ABNORMAL LOW (ref 8.9–10.3)
Chloride: 104 mmol/L (ref 101–111)
Creatinine, Ser: 0.91 mg/dL (ref 0.61–1.24)
GFR, EST AFRICAN AMERICAN: 54 mL/min — AB (ref >60)
GFR, EST NON AFRICAN AMERICAN: 46 mL/min — AB (ref >60)
Glucose, Bld: 187 mg/dL — ABNORMAL HIGH (ref 65–99)
Glucose, Bld: 168 mg/dL — ABNORMAL HIGH (ref 65–99)
POTASSIUM: 4 mmol/L (ref 3.5–5.1)
Phosphorus: 2.3 mg/dL — ABNORMAL LOW (ref 2.5–4.6)
Phosphorus: 3.7 mg/dL (ref 2.5–4.6)
Potassium: 3.8 mmol/L (ref 3.5–5.1)
SODIUM: 134 mmol/L — AB (ref 135–145)
Sodium: 135 mmol/L (ref 135–145)

## 2017-01-31 LAB — GLUCOSE, CAPILLARY
GLUCOSE-CAPILLARY: 178 mg/dL — AB (ref 65–99)
GLUCOSE-CAPILLARY: 185 mg/dL — AB (ref 65–99)
Glucose-Capillary: 180 mg/dL — ABNORMAL HIGH (ref 65–99)
Glucose-Capillary: 163 mg/dL — ABNORMAL HIGH (ref 65–99)
Glucose-Capillary: 120 mg/dL — ABNORMAL HIGH (ref 65–99)

## 2017-01-31 LAB — MAGNESIUM: MAGNESIUM: 2.4 mg/dL (ref 1.7–2.4)

## 2017-01-31 LAB — CULTURE, RESPIRATORY

## 2017-01-31 LAB — CORTISOL: Cortisol, Plasma: 2.3 ug/dL

## 2017-01-31 MED ORDER — NOREPINEPHRINE BITARTRATE 1 MG/ML IV SOLN
0.0000 ug/min | INTRAVENOUS | Status: DC
  Administered 2017-01-31: 10 ug/min via INTRAVENOUS
  Filled 2017-01-31 (×2): qty 16

## 2017-01-31 MED ORDER — ORAL CARE MOUTH RINSE
15.0000 mL | Freq: Two times a day (BID) | OROMUCOSAL | Status: DC
  Administered 2017-01-31 – 2017-02-04 (×7): 15 mL via OROMUCOSAL

## 2017-01-31 MED ORDER — SODIUM CHLORIDE 0.9 % IV SOLN
INTRAVENOUS | Status: DC
  Administered 2017-01-31: 23:00:00 via INTRAVENOUS

## 2017-01-31 MED ORDER — SODIUM PHOSPHATES 45 MMOLE/15ML IV SOLN
10.0000 mmol | Freq: Once | INTRAVENOUS | Status: AC
  Administered 2017-01-31: 10 mmol via INTRAVENOUS
  Filled 2017-01-31: qty 3.33

## 2017-01-31 NOTE — Progress Notes (Signed)
eLink Physician-Brief Progress Note Patient Name: Randall Reyes DOB: [birth year] MRN: [id number]   Date of Service  01/31/2017  HPI/Events of Note  tol extubation/ sips and chips fine  eICU Interventions  Clear liquid diet ordered at Nursing request     Intervention Category Minor Interventions: Routine modifications to care plan (e.g. PRN medications for pain, fever)  Christinia Gully 01/31/2017, 5:29 PM

## 2017-01-31 NOTE — Progress Notes (Signed)
[facility] Lab f/u note  Off CRRT  No recurrence so far of hyperkalemia Will continue to monitor labs   Recent Labs  01/30/17 0600  01/31/17 0353 01/31/17 1601  NA 137  < > 135 134*  K 3.9  < > 4.0 3.8  CL 104  < > 104 104  CO2 25  < > 25 26  GLUCOSE 115*  < > 187* 168*  BUN 19  < > 22* 28*  CREATININE 1.24  < > 0.91 1.40*  CALCIUM 7.4*  < > 7.3* 7.1*  MG 2.3  --  2.4  --   PHOS 5.1*  < > 2.3* 3.7  < > = values in this interval not displayed.  Jamal Maes, MD [facility] [phone] Pager 01/31/2017, 6:13 PM

## 2017-01-31 NOTE — Progress Notes (Signed)
eLink Physician-Brief Progress Note Patient Name: Randall Reyes DOB: Aug 17, 1935 MRN: [id number]   Date of Service  01/31/2017  HPI/Events of Note  Pt has recovered back to baseline mental status  And fm req d/c the dnr order  eICU Interventions  done     Intervention Category Minor Interventions: Communication with other healthcare providers and/or family  Christinia Gully 01/31/2017, 8:42 PM

## 2017-01-31 NOTE — Procedures (Signed)
Admit: 01/28/2017 LOS: 3  11M with cardiac arrest / VDRF after CTX for CLL, mild hyperkalemia, met acidosis  Current CRRT Prescription: Start Date: 01/29/17 Catheter: L IJ TDC 9/18 CCM BFR: 200 Pre Blood Pump: 500 0K DFR: 2000 0K Replacement Rate: 500 0K Goal UF: even to 20m neg/hr Anticoagulation: none Clotting: none to date  S: Stable K on all 4K bath.   UOP 0.7L Still intubated on pressors  O: 09/19 0701 - 09/20 0700 In: 1852.4 [I.V.:516.6; NG/GT:688.3; IV Piggyback:517.5] Out: 38366[Urine:705]  Filed Weights   01/29/17 0400 01/30/17 0500 01/31/17 0438  Weight: 68.3 kg (150 lb 9.2 oz) 66.5 kg (146 lb 9.7 oz) 65.5 kg (144 lb 6.4 oz)     Recent Labs Lab 01/30/17 1414 01/30/17 1820 01/31/17 0353  NA 135 136 135  K 4.1 4.1 4.0  CL 104 105 104  CO2 25 27 25   GLUCOSE 132* 113* 187*  BUN 18 17 22*  CREATININE 0.93 0.92 0.91  CALCIUM 7.5* 7.6* 7.3*  PHOS 4.1 3.8 2.3*    Recent Labs Lab 01/28/17 0801 01/28/17 1949 01/29/17 0223 01/29/17 0552 01/30/17 0600  WBC 227.7* 149.9*  --  124.7* 21.3*  NEUTROABS 4.6  --   --   --   --   HGB 8.8* 7.9* 7.8* 7.5* 7.3*  HCT 27.3* 26.3* 23.0* 24.0* 22.7*  MCV 116.7* 122.9*  --  116.5* 111.8*  PLT 123* 98*  --  76* 35*    Scheduled Meds: . chlorhexidine gluconate (MEDLINE KIT)  15 mL Mouth Rinse BID  . Chlorhexidine Gluconate Cloth  6 each Topical Daily  . feeding supplement (PRO-STAT SUGAR FREE 64)  30 mL Per Tube Daily  . insulin aspart  0-20 Units Subcutaneous Q4H  . mouth rinse  15 mL Mouth Rinse QID  . pantoprazole (PROTONIX) IV  40 mg Intravenous QHS   Continuous Infusions: . sodium chloride 10 mL/hr at 01/31/17 0700  . feeding supplement (VITAL AF 1.2 CAL) 1,000 mL (01/31/17 0700)  . heparin    . levETIRAcetam Stopped (01/30/17 2124)  . meropenem (MERREM) IV Stopped (01/30/17 2138)  . norepinephrine (LEVOPHED) Adult infusion 3 mcg/min (01/31/17 0708)  . dialysis replacement fluid (prismasate) 500 mL/hr at  01/31/17 0553  . dialysis replacement fluid (prismasate) 500 mL/hr at 01/30/17 1549  . dialysate (PRISMASATE) 1,000 mL/hr at 01/31/17 0444  . sodium chloride 999 mL/hr at 01/29/17 1030  . vancomycin Stopped (01/30/17 1118)   PRN Meds:.Place/Maintain arterial line **AND** sodium chloride, fentaNYL (SUBLIMAZE) injection, fentaNYL (SUBLIMAZE) injection, heparin, heparin, heparin, sodium chloride, sodium chloride flush  ABG    Component Value Date/Time   PHART 7.476 (H) 01/30/2017 0429   PCO2ART 36.2 01/30/2017 0429   PO2ART 173 (H) 01/30/2017 0429   HCO3 26.6 01/30/2017 0429   TCO2 29 01/29/2017 1433   ACIDBASEDEF 8.0 (H) 01/29/2017 0329   O2SAT 99.4 01/30/2017 0429    A/P  1. Dialysis Dependent AoCKD after CTX for CLL with WBC > 200 2. Hyperkalemia, stable on 4K bath and reduced HD Rx 3. Metabolic Acidosis on NaHCO3 gtt 4. CLL s/p CTX 9/17; appears rec rasburicase 5. VDRF 6. S/p Cardiac Arrest  1. Stop CRRT today 2. Follow q6 BMP  RPearson Grippe MD [facility] pgr [phone]

## 2017-01-31 NOTE — Procedures (Signed)
Extubation Procedure Note  Patient Details:   Name: Randall Reyes DOB: Oct 29, 1935 MRN: [id number]   Airway Documentation: Pt alert and oriented following commands.  Good cuff leak noted prior to extubation.  Extubated to 4 lpm [place] sat 96%, able to speak and good strong productive cough.  IS instruction given pt did 10x 750-cc.    Evaluation  O2 sats: stable throughout Complications: No apparent complications Patient did tolerate procedure well. Bilateral Breath Sounds: Clear, Diminished   Yes  Ned Grace 01/31/2017, 10:17 AM

## 2017-01-31 NOTE — Progress Notes (Signed)
PULMONARY / CRITICAL CARE MEDICINE   Name: Randall Reyes MRN: [id number] DOB: [birth year]    ADMISSION DATE:  01/28/2017 CONSULTATION DATE:  01/28/2017  REFERRING MD:  Dr. Dayna Barker   CHIEF COMPLAINT:  Syncope   HISTORY OF PRESENT ILLNESS:   81 year old male with PMH of Anemia, CLL (diagnosed 2006, treated with FCR in 2010 -- only received 3 cycles due to prolonged pancytopenia, 2015 started on Imbruica, Treated with 1 cycle BR 10/14/2295- 9/89/2119 complicated by severe tumor lysis syndrome (despite allopurinol and rasburicase) requiring hospitalization and dialysis), DM, HTN  Presents to ED on 9/17 after syncopal episode at home requiring brief CPR, family reports that patient underwent chemotherapy treatment earlier in the day. Upon arrival to ED patient was noted to be alert, however during stay experienced another episode on unresponsiveness and was noted to be pulseless. After one round of CPR ROSC was achieved. WBC 149.9, K 6.0, Uric Acid <0.5. Patient transferred to [facility] for further management. PCCM asked to admit.     SUBJECTIVE:  Very awake and alert.Back on pressors. Suspect he can be extubated. Weaning levo. CRRT to be stopped today.   VITAL SIGNS: BP (!) 75/43   Pulse 86   Temp 97.7 F (36.5 C) (Axillary)   Resp 20   Ht 5\' 1"  (1.549 m)   Wt 144 lb 6.4 oz (65.5 kg)   SpO2 100%   BMI 27.28 kg/m   HEMODYNAMICS:    VENTILATOR SETTINGS: Vent Mode: PSV;CPAP FiO2 (%):  [40 %] 40 % Set Rate:  [18 bmp] 18 bmp Vt Set:  [550 mL] 550 mL PEEP:  [5 cmH20] 5 cmH20 Pressure Support:  [5 cmH20-10 cmH20] 5 cmH20 Plateau Pressure:  [16 cmH20-19 cmH20] 16 cmH20  INTAKE / OUTPUT: I/O last 3 completed shifts: In: 2359 [I.V.:813.2; Other:130; NG/GT:688.3; IV Piggyback:727.5] Out: 4174 [Urine:1060; Other:3215]  PHYSICAL EXAMINATION: General: Elderly male, NAD HEENT: MM pink/moist. ET -> vent  PSY:nl affect Neuro: Intact CV: HSR RRR PULM: even/non-labored,  lungs bilaterally decreased YC:XKGY, non-tender, bsx4 active  Extremities: warm/dry, + edema  Skin: no rashes or lesions    LABS:  BMET  Recent Labs Lab 01/30/17 1414 01/30/17 1820 01/31/17 0353  NA 135 136 135  K 4.1 4.1 4.0  CL 104 105 104  CO2 25 27 25   BUN 18 17 22*  CREATININE 0.93 0.92 0.91  GLUCOSE 132* 113* 187*    Electrolytes  Recent Labs Lab 01/29/17 0552  01/30/17 0600  01/30/17 1414 01/30/17 1820 01/31/17 0353  CALCIUM  --   < > 7.4*  < > 7.5* 7.6* 7.3*  MG 1.9  --  2.3  --   --   --  2.4  PHOS 6.2*  < > 5.1*  --  4.1 3.8 2.3*  < > = values in this interval not displayed.  CBC  Recent Labs Lab 01/28/17 1949 01/29/17 0223 01/29/17 0552 01/30/17 0600  WBC 149.9*  --  124.7* 21.3*  HGB 7.9* 7.8* 7.5* 7.3*  HCT 26.3* 23.0* 24.0* 22.7*  PLT 98*  --  76* 35*    Coag's No results for input(s): APTT, INR in the last 168 hours.  Sepsis Markers  Recent Labs Lab 01/29/17 0100 01/29/17 0428 01/29/17 1127  LATICACIDVEN 5.7* 4.7* 1.7    ABG  Recent Labs Lab 01/29/17 1113 01/29/17 1433 01/30/17 0429  PHART 7.439 7.444 7.476*  PCO2ART 39.2 40.2 36.2  PO2ART 166.0* 221.0* 173*    Liver Enzymes  Recent  Labs Lab 01/28/17 0801  01/30/17 1414 01/30/17 1820 01/31/17 0353  AST 21  --   --   --   --   ALT 12*  --   --   --   --   ALKPHOS 112  --   --   --   --   BILITOT 0.5  --   --   --   --   ALBUMIN 3.8  < > 2.5* 2.5* 2.6*  < > = values in this interval not displayed.  Cardiac Enzymes  Recent Labs Lab 01/28/17 2011  TROPONINI 0.03*    Glucose  Recent Labs Lab 01/30/17 1208 01/30/17 1524 01/30/17 2001 01/30/17 2341 01/31/17 0346 01/31/17 0728  GLUCAP 127* 123* 114* 163* 178* 180*    Imaging No results found.   STUDIES:  CXR 9/17 > No acute  CXR 9/18 > RLL opacity > LLL EEG 9/18 >diffuse changes 2d 9/18>>ef 55% g1dd  CULTURES: Sputum 9/18 > BC 9/18 >  ANTIBIOTICS: Vanc  9/18 >  Merrem 9/18 >    SIGNIFICANT EVENTS: 9/17 > Presents to ED, multiple codes  LINES/TUBES: Right Chest Port > L IJ CVL 9/17 >  L femoral A line 9/18 >    DISCUSSION: 81 year old male with CLL presents to ED on 9/18 with syncopal event. K 6.0, WBC 149.9. Suspected to be in Tumor Lysis. CRRT started 9/18 with vast improvement. O2 needs minimal. May be able to extubate .  ASSESSMENT / PLAN:  PULMONARY A: Respiratory Insufficiency - s/p intubation 9/18 Resp alkalosis Possible HCAP - CXR 9/18 with RLL > LLL opacity P:   Full vent support, wean as tolerated Bronchial hygiene  Send sputum culture On empiric abx CXR in AM May be able to wean 9/20  CARDIOVASCULAR A:  Hypotension - shock? A.Fib Bradycardia  Aortic Regurgitation  G1DD (EF 55-60 on 06/19/16)  H/O HTN  P:  Cardiac Monitoring Off vasopressors 9/19 but restarted 9/20, will continue to wean Can dc aline in near future if remains stable.   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  At risk MDR given chemo / immunocompromised host P:   Assess sputum culture, blood culture Started vanc / merrem on 9/18  ENDOCRINE CBG (last 3)   Recent  Labs  01/30/17 2341 01/31/17 0346 01/31/17 0728  GLUCAP 163* 178* 180*     A:   DM   P:   Trend Glucose  SSI   NEUROLOGIC A:   Acute Encephalopathy - remains unresponsive AM 9/18 despite no sedation ? szs activity 9/18 and started on keppra P:  RASS goal: 0/-1 Fentanyl PRN  Dc versed   EEG with some diffuse changes 9/19 decrease keppra to 750 mg q12h while on CRRT, low threshold to dc keppra.  FAMILY  - Updates: Family updated 9/12.    - Inter-disciplinary family meet or Palliative Care meeting due by: 02/05/2017   CC time: 35 min.   Richardson Landry Minor ACNP Maryanna Shape PCCM Pager [phone] till 3 pm If no answer page [phone] 01/31/2017, 9:02 AM  STAFF NOTE: I, Merrie Roof, MD FACP have personally reviewed patient's available data, including medical history, events of note, physical examination and test results as part of my evaluation. I have discussed with resident/NP and other care providers such as pharmacist, RN and RRT. In addition, I personally evaluated patient and elicited key findings of: awakens, FC, perrl, was neg on balance on cvvhd, lungs are improved aeration, to clear, abdo soft, no rash, edema remains, pcxr which I reviewed shows bibasilr infiltrates, ett wnl, lines wnl, WBC down, dilution, error?, repeat cbc in am , cvvhd to stop then follow output, MAP at goal with levophed restart, , may need cvp assessment, get cortisol now, weaning aggressive as MS improved, wean cpap 5 ps5, goal 1 hour, assess upright position, repeat pcxr in am , remains culture neg immune suppressed, dc vanc, mero to keep for now, EEG moderate diffuse slowing, he had CLINICAL seizures, keep keprra x 21 days, full WUA The patient is critically ill with multiple organ systems failure and requires high complexity decision making for assessment and support, frequent evaluation and titration of therapies, application of advanced monitoring technologies and extensive interpretation of multiple  databases.   Critical Care Time devoted to patient care services described in this note is30 Minutes. This time reflects time of care of this signee: Merrie Roof, MD FACP. This critical care time does not reflect procedure time, or teaching time or supervisory time of PA/NP/Med student/Med Resident etc but could involve care discussion time. Rest per NP/medical resident whose note is outlined above and that I agree with   Lavon Paganini. Titus Mould, MD, [place] Pgr: [place] [facility] 01/31/2017 9:41 AM

## 2017-02-01 ENCOUNTER — Other Ambulatory Visit (HOSPITAL_COMMUNITY): Payer: Medicare Other

## 2017-02-01 ENCOUNTER — Other Ambulatory Visit: Payer: Self-pay

## 2017-02-01 ENCOUNTER — Inpatient Hospital Stay (HOSPITAL_COMMUNITY): Payer: Medicare Other

## 2017-02-01 DIAGNOSIS — J9600 Acute respiratory failure, unspecified whether with hypoxia or hypercapnia: Secondary | ICD-10-CM

## 2017-02-01 LAB — RENAL FUNCTION PANEL
ALBUMIN: 2.5 g/dL — AB (ref 3.5–5.0)
ANION GAP: 5 (ref 5–15)
Albumin: 2.6 g/dL — ABNORMAL LOW (ref 3.5–5.0)
Anion gap: 7 (ref 5–15)
BUN: 32 mg/dL — AB (ref 6–20)
BUN: 33 mg/dL — AB (ref 6–20)
CALCIUM: 7.5 mg/dL — AB (ref 8.9–10.3)
CO2: 25 mmol/L (ref 22–32)
CO2: 25 mmol/L (ref 22–32)
CREATININE: 1.81 mg/dL — AB (ref 0.61–1.24)
Calcium: 7.3 mg/dL — ABNORMAL LOW (ref 8.9–10.3)
Chloride: 104 mmol/L (ref 101–111)
Chloride: 106 mmol/L (ref 101–111)
Creatinine, Ser: 1.94 mg/dL — ABNORMAL HIGH (ref 0.61–1.24)
GFR calc Af Amer: 39 mL/min — ABNORMAL LOW (ref >60)
GFR calc Af Amer: 36 mL/min — ABNORMAL LOW (ref >60)
GFR calc non Af Amer: 31 mL/min — ABNORMAL LOW (ref >60)
GFR, EST NON AFRICAN AMERICAN: 34 mL/min — AB (ref >60)
GLUCOSE: 127 mg/dL — AB (ref 65–99)
GLUCOSE: 221 mg/dL — AB (ref 65–99)
PHOSPHORUS: 3.3 mg/dL (ref 2.5–4.6)
POTASSIUM: 3.7 mmol/L (ref 3.5–5.1)
POTASSIUM: 4.2 mmol/L (ref 3.5–5.1)
Phosphorus: 3.1 mg/dL (ref 2.5–4.6)
SODIUM: 136 mmol/L (ref 135–145)
Sodium: 136 mmol/L (ref 135–145)

## 2017-02-01 LAB — RETICULOCYTES
RBC.: 2.31 MIL/uL — AB (ref 4.22–5.81)
RETIC CT PCT: 1.3 % (ref 0.4–3.1)
Retic Count, Absolute: 30 10*3/uL (ref 19.0–186.0)

## 2017-02-01 LAB — CBC
HCT: 20.2 % — ABNORMAL LOW (ref 39.0–52.0)
HEMATOCRIT: 24.3 % — AB (ref 39.0–52.0)
HEMOGLOBIN: 6.6 g/dL — AB (ref 13.0–17.0)
Hemoglobin: 8.1 g/dL — ABNORMAL LOW (ref 13.0–17.0)
MCH: 36.7 pg — AB (ref 26.0–34.0)
MCH: 35.1 pg — AB (ref 26.0–34.0)
MCHC: 32.7 g/dL (ref 30.0–36.0)
MCHC: 33.3 g/dL (ref 30.0–36.0)
MCV: 112.2 fL — ABNORMAL HIGH (ref 78.0–100.0)
MCV: 105.2 fL — AB (ref 78.0–100.0)
Platelets: 29 10*3/uL — CL (ref 150–400)
Platelets: 24 10*3/uL — CL (ref 150–400)
RBC: 1.8 MIL/uL — ABNORMAL LOW (ref 4.22–5.81)
RBC: 2.31 MIL/uL — AB (ref 4.22–5.81)
RDW: 16 % — AB (ref 11.5–15.5)
RDW: 20.7 % — AB (ref 11.5–15.5)
WBC: 6.7 10*3/uL (ref 4.0–10.5)
WBC: 6.4 10*3/uL (ref 4.0–10.5)

## 2017-02-01 LAB — GLUCOSE, CAPILLARY
GLUCOSE-CAPILLARY: 120 mg/dL — AB (ref 65–99)
GLUCOSE-CAPILLARY: 260 mg/dL — AB (ref 65–99)
Glucose-Capillary: 144 mg/dL — ABNORMAL HIGH (ref 65–99)
Glucose-Capillary: 160 mg/dL — ABNORMAL HIGH (ref 65–99)
Glucose-Capillary: 190 mg/dL — ABNORMAL HIGH (ref 65–99)
Glucose-Capillary: 220 mg/dL — ABNORMAL HIGH (ref 65–99)
Glucose-Capillary: 258 mg/dL — ABNORMAL HIGH (ref 65–99)

## 2017-02-01 LAB — MAGNESIUM: Magnesium: 2.3 mg/dL (ref 1.7–2.4)

## 2017-02-01 LAB — LACTATE DEHYDROGENASE: LDH: 356 U/L — AB (ref 98–192)

## 2017-02-01 LAB — PREPARE RBC (CROSSMATCH)

## 2017-02-01 LAB — ABO/RH: ABO/RH(D): AB POS

## 2017-02-01 MED ORDER — HYDROCORTISONE NA SUCCINATE PF 100 MG IJ SOLR
50.0000 mg | Freq: Four times a day (QID) | INTRAMUSCULAR | Status: DC
  Administered 2017-02-01 – 2017-02-06 (×20): 50 mg via INTRAVENOUS
  Filled 2017-02-01: qty 1
  Filled 2017-02-01: qty 2
  Filled 2017-02-01: qty 1
  Filled 2017-02-01: qty 2
  Filled 2017-02-01 (×4): qty 1
  Filled 2017-02-01: qty 2
  Filled 2017-02-01 (×6): qty 1
  Filled 2017-02-01 (×3): qty 2
  Filled 2017-02-01 (×2): qty 1
  Filled 2017-02-01 (×3): qty 2
  Filled 2017-02-01: qty 1

## 2017-02-01 MED ORDER — SODIUM CHLORIDE 0.9 % IV SOLN
INTRAVENOUS | Status: DC
  Administered 2017-02-01 – 2017-02-04 (×3): via INTRAVENOUS

## 2017-02-01 MED ORDER — SODIUM CHLORIDE 0.9 % IV SOLN
Freq: Once | INTRAVENOUS | Status: AC
  Administered 2017-02-01: 08:00:00 via INTRAVENOUS

## 2017-02-01 MED ORDER — DOPAMINE-DEXTROSE 3.2-5 MG/ML-% IV SOLN
5.0000 ug/kg/min | INTRAVENOUS | Status: DC
  Administered 2017-02-01: 4 ug/kg/min via INTRAVENOUS
  Administered 2017-02-02: 6 ug/kg/min via INTRAVENOUS
  Administered 2017-02-03: 3.5 ug/kg/min via INTRAVENOUS
  Filled 2017-02-01 (×2): qty 250

## 2017-02-01 MED ORDER — FLUDROCORTISONE ACETATE 0.1 MG PO TABS
0.1000 mg | ORAL_TABLET | Freq: Every day | ORAL | Status: DC
  Administered 2017-02-01 – 2017-02-06 (×6): 0.1 mg via ORAL
  Filled 2017-02-01 (×7): qty 1

## 2017-02-01 NOTE — Progress Notes (Addendum)
PULMONARY / CRITICAL CARE MEDICINE   Name: Randall Reyes MRN: [id number] DOB: 1935/07/03    ADMISSION DATE:  01/28/2017 CONSULTATION DATE:  01/28/2017  REFERRING MD:  Dr. Dayna Barker   CHIEF COMPLAINT:  Syncope   HISTORY OF PRESENT ILLNESS:   81 year old male with PMH of Anemia, CLL (diagnosed 2006, treated with FCR in 2010 -- only received 3 cycles due to prolonged pancytopenia, 2015 started on Imbruica, Treated with 1 cycle BR 10/13/8313- 1/76/1607 complicated by severe tumor lysis syndrome (despite allopurinol and rasburicase) requiring hospitalization and dialysis), DM, HTN  Presents to ED on 9/17 after syncopal episode at home requiring brief CPR, family reports that patient underwent chemotherapy treatment earlier in the day. Upon arrival to ED patient was noted to be alert, however during stay experienced another episode on unresponsiveness and was noted to be pulseless. After one round of CPR ROSC was achieved. WBC 149.9, K 6.0, Uric Acid <0.5. Patient transferred to [facility] for further management. PCCM asked to admit.     SUBJECTIVE:  Extubated but still on pressors  VITAL SIGNS: BP (!) 94/39   Pulse 65   Temp 98.4 F (36.9 C) (Oral)   Resp 11   Ht 5\' 1"  (1.549 m)   Wt 140 lb 6.9 oz (63.7 kg)   SpO2 98%   BMI 26.53 kg/m   HEMODYNAMICS:    VENTILATOR SETTINGS:    INTAKE / OUTPUT: I/O last 3 completed shifts: In: 2838.8 [P.O.:240; I.V.:1010; Other:120; NG/GT:700; IV Piggyback:768.8] Out: 3271 [Urine:1435; Emesis/NG output:20; Other:1816]  PHYSICAL EXAMINATION: General:  Awake alert HEENT: MM pink/moist, et out PSY:nl affect Neuro: intact CV: s1s2 rrr, hsr PULM: even/non-labored, lungs bilaterally decreased in bases PX:TGGY, non-tender, bsx4 active  Extremities: warm/dry, -edema  Skin: no rashes or lesions     LABS:  BMET  Recent Labs Lab 01/31/17 0353 01/31/17 1601 02/01/17 0400  NA 135 134* 136  K 4.0 3.8 3.7  CL 104 104 104  CO2  25 26 25   BUN 22* 28* 32*  CREATININE 0.91 1.40* 1.81*  GLUCOSE 187* 168* 127*    Electrolytes  Recent Labs Lab 01/30/17 0600  01/31/17 0353 01/31/17 1601 02/01/17 0400  CALCIUM 7.4*  < > 7.3* 7.1* 7.3*  MG 2.3  --  2.4  --  2.3  PHOS 5.1*  < > 2.3* 3.7 3.3  < > = values in this interval not displayed.  CBC  Recent Labs Lab 01/29/17 0552 01/30/17 0600 02/01/17 0400  WBC 124.7* 21.3* 6.7  HGB 7.5* 7.3* 6.6*  HCT 24.0* 22.7* 20.2*  PLT 76* 35* 29*    Coag's No results for input(s): APTT, INR in the last 168 hours.  Sepsis Markers  Recent Labs Lab 01/29/17 0100 01/29/17 0428 01/29/17 1127  LATICACIDVEN 5.7* 4.7* 1.7    ABG  Recent Labs Lab 01/29/17 1113 01/29/17 1433 01/30/17 0429  PHART 7.439 7.444 7.476*  PCO2ART 39.2 40.2 36.2  PO2ART 166.0* 221.0* 173*    Liver Enzymes  Recent Labs Lab 01/28/17 0801  01/31/17 0353 01/31/17 1601 02/01/17 0400  AST 21  --   --   --   --   ALT 12*  --   --   --   --   ALKPHOS 112  --   --   --   --   BILITOT 0.5  --   --   --   --   ALBUMIN 3.8  < > 2.6* 2.7* 2.5*  < > =  values in this interval not displayed.  Cardiac Enzymes  Recent Labs Lab 01/28/17 2011  TROPONINI 0.03*    Glucose  Recent Labs Lab 01/31/17 1140 01/31/17 1540 01/31/17 1947 02/01/17 0010 02/01/17 0337 02/01/17 0730  GLUCAP 185* 163* 120* 160* 120* 190*    Imaging Dg Chest Port 1 View  Result Date: 02/01/2017 CLINICAL DATA:  Respiratory failure . EXAM: PORTABLE CHEST 1 VIEW COMPARISON:  01/30/2017. FINDINGS: Interim extubation and removal of NG tube. Bilateral IJ lines in stable position. Stable cardiomegaly . Bilateral lower lobe pulmonary infiltrates with basilar atelectasis again noted. No interim change. No pleural effusion or pneumothorax . IMPRESSION: 1. Interim extubation removal of NG tube. Bilateral IJ lines in stable position. 2. Bilateral lower lobe pulmonary infiltrates and atelectasis again noted. No interim  change. 3. Stable cardiomegaly. Electronically Signed   By: Marcello Moores  Register   On: 02/01/2017 06:24     STUDIES:  CXR 9/17 > No acute  CXR 9/18 > RLL opacity > LLL EEG 9/18 >diffuse changes 2d 9/18>>ef 55% g1dd  CULTURES: Sputum 9/18 > BC 9/18 >  ANTIBIOTICS: Vanc  9/18 >  Merrem 9/18 >   SIGNIFICANT EVENTS: 9/17 > Presents to ED, multiple codes  LINES/TUBES: Right Chest Port > L IJ CVL 9/17 >  L femoral A line 9/18 > Et dc 9/20    DISCUSSION: 81 year old male with CLL presents to ED on 9/18 with syncopal event. K 6.0, WBC 149.9. Suspected to be in Tumor Lysis. CRRT started 9/18 with vast improvement. O2 needs minimal. Extubated 9/20. 9/21 still on pressors, hgb 6.7.  ASSESSMENT / PLAN:  PULMONARY A: Respiratory Insufficiency - s/p intubation 9/18 Resp alkalosis Possible HCAP - CXR 9/18 with RLL > LLL opacity P:   Extubated 9/20 Bronchial hygiene  Send sputum culture On empiric abx CXR in AM   CARDIOVASCULAR A:  Hypotension - shock? A.Fib Bradycardia  Aortic Regurgitation  G1DD (EF 55-60 on 06/19/16)  H/O HTN  P:  Cardiac Monitoring Off vasopressors 9/19 but restarted 9/20, will continue to wean Can dc aline in near future if remains stable. 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 (base crt 2.0-2.2) s/p 3L NS in ED  AGMA P:   Nephrology following, CRRT 9/18 to be stopped 9/20   GASTROINTESTINAL A:   GI prophylaxis P:   NPO PPI  Start TF 9/19 PO diet  HEMATOLOGIC  Recent Labs  01/30/17 0600 02/01/17 0400  HGB 7.3* 6.6*    A:   Tumor Lysis Syndrome  Chronic Lymphocytic Leukemia  -Began  Obinutuzumab + Chlorambucil 9/17 with Rasburicase  H/O Complications/Poor tolerance with Chemotherapy  Chronic anemia and thrombocytopenia - due to chemo Anemia P:   uric acid level 0.5 - no need additional rasburicase Follow CBC Transfuse for Hgb<7.0  INFECTIOUS A:   Possible HCAP - CXR 9/18 with RLL > LLL opacity.  At risk MDR given chemo / immunocompromised host P:   Assess sputum culture, blood culture Started vanc / merrem on 9/18  ENDOCRINE CBG (last 3)   Recent Labs  02/01/17 0010 02/01/17 0337 02/01/17  0730  GLUCAP 160* 120* 190*     A:   DM   P:   Trend Glucose  SSI   NEUROLOGIC A:   Acute Encephalopathy - remains unresponsive AM 9/18 despite no sedation ? szs activity 9/18 and started on keppra P:   RASS goal: 0/-1 Fentanyl PRN  Dc versed   EEG with some diffuse changes 9/19 decrease keppra to 750 mg q12h while on CRRT, low threshold to dc keppra.  FAMILY  - Updates: Family updated 9/21.    - Inter-disciplinary family meet or Palliative Care meeting due by: 02/05/2017   CC time: 35 min.   Richardson Landry Minor ACNP Maryanna Shape PCCM Pager [phone] till 3 pm If no answer page [phone] 02/01/2017, 9:35 AM   STAFF NOTE: I, Merrie Roof, MD FACP have personally reviewed patient's available data, including medical history, events of note, physical examination and test results as part of my evaluation. I have discussed with resident/NP and other care providers such as pharmacist, RN and RRT. In addition, I personally evaluated patient and elicited key findings of: awake, alert, no distress off vent, pos 2.8 liters, jvd wnl, lungs clearer, abdo soft, BS wnl, no r/ g, less distended abdo after BM, on Clears, remains in shock on pressros, pcxr I reviewed shows improved infiltrates, he is off cvvhd and urine output is improved, he is in heart block and shock state is worse, add pacer pads to chest, add dopamine to bat affect, titrate levo down as able, MAP 60 goal,  saline add back at 50, pancytopenia, anemia for 1 unit prbc, repeat cbc after post Tx, cortisol 2.3 rule out primary AI, vs rel AI, add stress roids, florinef, remains culture neg, consider empiric course meropenem, was on PCP prevention, holding with ARF pnacytopenia for now, Updated wife, getting cards for block, assess retic, appears to be BM suppression, may need EPO, follow WBC AN+C The patient is critically ill with multiple organ systems failure and requires high complexity decision making for assessment and support, frequent evaluation and titration of therapies, application of advanced monitoring technologies and extensive interpretation of multiple databases.   Critical Care Time devoted to patient care services described in this note is 35 Minutes. This time reflects time of care of this signee: Merrie Roof, MD FACP. This critical care time does not reflect procedure time, or teaching time or supervisory time of PA/NP/Med student/Med Resident etc but could involve care discussion time. Rest per NP/medical resident whose note is outlined above and that I agree with   Lavon Paganini. Titus Mould, MD, [place] Pgr: [place] [facility] 02/01/2017 11:05 AM

## 2017-02-01 NOTE — Progress Notes (Signed)
[place] Progress Note Patient Name: Randall Reyes DOB: 04/22/1936 MRN: [id number]   Date of Service  02/01/2017  HPI/Events of Note  Anemia - Hgb = 6.6.  eICU Interventions  Will transfuse 1 unit PRBC.     Intervention Category Major Interventions: Other:  [name] Cornelia Copa 02/01/2017, 5:42 AM

## 2017-02-01 NOTE — Consult Note (Signed)
ELECTROPHYSIOLOGY CONSULT NOTE    Patient ID: Randall Reyes MRN: [id number], DOB/AGE: 1935-07-15 81 y.o.  Admit date: 01/28/2017 Date of Consult: 02/01/2017  Primary Physician: [name], MD Primary Cardiologist: Bronson Ing Electrophysiologist: Curt Bears (new this admission)  Patient Profile: Randall Reyes is a 81 y.o. male with a history of CLL, bifascicular block, baseline bradycardia who is being seen today for the evaluation of syncope and heart block at the request of Dr Titus Mould.  HPI:  Randall Reyes is a 81 y.o. male who was admitted after a syncopal spell the day of admission. He has no memory of the event but does remember that he had chemo that day. He required CPR and EMS was called. He was brought to the ER and had a pulseless episode where he required another round of CPR.  He was transferred to [facility] for further management. He has been treated for acute renal failure with CRRT, required intubation but is now extubated.  He denies dizziness, pre-syncope, or syncope at home. Of note, he saw Dr Bronson Ing earlier this year and was bradycardic at that visit but asymptomatic. He is on no AVN blocking agents. He is currently still requiring pressor support.   He is also anemic this morning and plan is for transfusion.   He denies chest pain, palpitations, dyspnea, PND, orthopnea, nausea, vomiting, dizziness, syncope, edema, weight gain, or early satiety.  Past Medical History:  Diagnosis Date  . Anemia   . B12 deficiency 12/07/2014  . CLL (chronic lymphocytic leukemia) ([place]) 01/24/2011  . Diabetes mellitus   . DM (diabetes mellitus) ([place]) 01/24/2011  . Eczema 01/24/2011  . History of pneumonia 04/2014  . Hypertension   . Leukemia ([place]) 6.22.2012   PER PATIENT  . Melanoma in situ ([place]) 01/24/2011  . Mild obesity 01/24/2011  . Port catheter in place 05/02/2012  . Shingles   . Ulcer      Surgical History:  Past Surgical History:  Procedure Laterality Date    . bleeding ulcer    . CATARACT EXTRACTION W/PHACO  05/05/2012   Procedure: CATARACT EXTRACTION PHACO AND INTRAOCULAR LENS PLACEMENT (IOC);  Surgeon: Tonny Branch, MD;  Location: [facility];  Service: Ophthalmology;  Laterality: Right;  CDE:13.25  . CATARACT EXTRACTION W/PHACO  05/15/2012   Procedure: CATARACT EXTRACTION PHACO AND INTRAOCULAR LENS PLACEMENT (IOC);  Surgeon: Tonny Branch, MD;  Location: [facility];  Service: Ophthalmology;  Laterality: Left;  CDE:  12.32  . COLONOSCOPY N/A 01/30/2016   Procedure: COLONOSCOPY;  Surgeon: Danie Binder, MD;  Location: [facility];  Service: Endoscopy;  Laterality: N/A;  2:00 PM - moved to 1:00 - office notified pt  . ESOPHAGOGASTRODUODENOSCOPY  2008   Dr. Oneida Alar: normal esophagus, antral erythema, 1 cm clean based duodenal ulcera, negative H. plyori  . ESOPHAGOGASTRODUODENOSCOPY N/A 01/30/2016   Procedure: ESOPHAGOGASTRODUODENOSCOPY (EGD);  Surgeon: Danie Binder, MD;  Location: [facility];  Service: Endoscopy;  Laterality: N/A;  . HERNIA REPAIR  2001  . PORTACATH PLACEMENT  2008     Prescriptions Prior to Admission  Medication Sig Dispense Refill Last Dose  . acetaminophen (TYLENOL) 500 MG tablet Take 500 mg by mouth at bedtime as needed for mild pain or moderate pain.    01/27/2017 at Unknown time  . acyclovir (ZOVIRAX) 400 MG tablet Take 1 tablet (400 mg total) by mouth daily. 30 tablet 5 01/28/2017 at Unknown time  . allopurinol (ZYLOPRIM) 300 MG tablet Take 1 tablet (300 mg total) by  mouth daily. 30 tablet 0 01/28/2017 at Unknown time  . chlorambucil (LEUKERAN) 2 MG tablet Take 32 mg PO (16 tablets) on Day 1 and 15 of each 28 day cycle of chemotherapy.  Give on an empty stomach 1 hour before or 2 hours after meals. 64 tablet 0 01/28/2017 at Unknown time  . cholecalciferol (VITAMIN D) 400 units TABS tablet Take 400 Units by mouth daily.   01/27/2017 at Unknown time  . lidocaine-prilocaine (EMLA) cream Apply a quarter size amount to affected area 1 hour  prior to coming to chemotherapy.  Do not rub in.  Cover with plastic wrap. 30 g 2 01/28/2017 at Unknown time  . Obinutuzumab (GAZYVA IV) Inject into the vein as directed.    01/28/2017 at Unknown time  . ondansetron (ZOFRAN) 8 MG tablet Take 1 tablet (8 mg total) by mouth every 8 (eight) hours as needed for nausea or vomiting. 30 tablet 2 01/28/2017 at Unknown time  . prochlorperazine (COMPAZINE) 10 MG tablet Take 1 tablet (10 mg total) by mouth every 6 (six) hours as needed for nausea or vomiting. 30 tablet 2 01/28/2017 at Unknown time  . rosuvastatin (CRESTOR) 40 MG tablet Take 40 mg by mouth at bedtime.    01/27/2017 at Unknown time  . sevelamer carbonate (RENVELA) 800 MG tablet Take 2 tablets (1,600 mg total) by mouth 3 (three) times daily with meals. (Patient taking differently: Take 800 mg by mouth 3 (three) times daily with meals. ) 180 tablet 2 01/28/2017 at Unknown time  . sitaGLIPtin (JANUVIA) 100 MG tablet Take 100 mg by mouth daily.   01/28/2017 at Unknown time  . sodium bicarbonate 650 MG tablet TAKE 1 TABLET BY MOUTH TWICE A DAY AS DIRECTED  3 01/28/2017 at Unknown time  . sulfamethoxazole-trimethoprim (BACTRIM DS,SEPTRA DS) 800-160 MG tablet Take 1 tablet by mouth 3 (three) times a week. 12 tablet 5 01/28/2017 at Unknown time    Inpatient Medications: . Chlorhexidine Gluconate Cloth  6 each Topical Daily  . fludrocortisone  0.1 mg Oral Daily  . hydrocortisone sod succinate (SOLU-CORTEF) inj  50 mg Intravenous Q6H  . insulin aspart  0-20 Units Subcutaneous Q4H  . mouth rinse  15 mL Mouth Rinse BID  . pantoprazole (PROTONIX) IV  40 mg Intravenous QHS    Allergies:  Allergies  Allergen Reactions  . Aspirin Other (See Comments)    Bleeding ulcers.   . Ibuprofen Other (See Comments)    Bleeding ulcers.    Social History   Social History  . Marital status: Married    Spouse name: N/A  . Number of children: N/A  . Years of education: N/A   Occupational History  . Not on file.    Social History Main Topics  . Smoking status: Former Research scientist (life sciences)  . Smokeless tobacco: Former Systems developer    Quit date: 04/25/1967  . Alcohol use No  . Drug use: No  . Sexual activity: Not on file   Other Topics Concern  . Not on file   Social History Narrative  . No narrative on file     Family History  Problem Relation Age of Onset  . Diabetes Father   . Colon cancer Neg Hx      Review of Systems: All other systems reviewed and are otherwise negative except as noted above.  Physical Exam: Vitals:   02/01/17 0900 02/01/17 1000 02/01/17 1100 02/01/17 1141  BP: (!) 94/39 110/61 (!) 98/51   Pulse: 65 72 69  Resp: 11 14 13    Temp:    98.8 F (37.1 C)  TempSrc:    Oral  SpO2: 98% 93% (!) 88%   Weight:      Height:        GEN- The patient is elderly appearing, alert and oriented x 3 today.   HEENT: normocephalic, atraumatic; sclera clear, conjunctiva pink; hearing intact; oropharynx clear; neck supple Lungs- Clear to ausculation bilaterally, normal work of breathing.  No wheezes, rales, rhonchi Heart- Regular rate and rhythm, no murmurs, rubs or gallops GI- soft, non-tender, non-distended, bowel sounds present Extremities- no clubbing, cyanosis, or edema  MS- no significant deformity or atrophy Skin- warm and dry, no rash or lesion, +central line Left IJ, port R chest  Psych- euthymic mood, full affect Neuro- strength and sensation are intact  Labs:  Lab Results  Component Value Date   WBC 6.7 02/01/2017   HGB 6.6 (LL) 02/01/2017   HCT 20.2 (L) 02/01/2017   MCV 112.2 (H) 02/01/2017   PLT 29 (LL) 02/01/2017    Recent Labs Lab 01/28/17 0801  02/01/17 0400  NA 137  < > 136  K 4.8  < > 3.7  CL 102  < > 104  CO2 26  < > 25  BUN 31*  < > 32*  CREATININE 2.66*  < > 1.81*  CALCIUM 8.6*  < > 7.3*  PROT 7.0  --   --   BILITOT 0.5  --   --   ALKPHOS 112  --   --   ALT 12*  --   --   AST 21  --   --   GLUCOSE 154*  < > 127*  < > = values in this interval not  displayed.    Radiology/Studies: Dg Chest Port 1 View Result Date: 02/01/2017 CLINICAL DATA:  Respiratory failure . EXAM: PORTABLE CHEST 1 VIEW COMPARISON:  01/30/2017. FINDINGS: Interim extubation and removal of NG tube. Bilateral IJ lines in stable position. Stable cardiomegaly . Bilateral lower lobe pulmonary infiltrates with basilar atelectasis again noted. No interim change. No pleural effusion or pneumothorax . IMPRESSION: 1. Interim extubation removal of NG tube. Bilateral IJ lines in stable position. 2. Bilateral lower lobe pulmonary infiltrates and atelectasis again noted. No interim change. 3. Stable cardiomegaly. Electronically Signed   By: Marcello Moores  Register   On: 02/01/2017 06:24   JKD:TOIZT rhythm, Mobitz I heart block, PAC's (personally reviewed)  TELEMETRY: SR, Mobitz I, overnight some intermittent consecutive non conducted P waves (personally reviewed)  Assessment/Plan: 1.  Mobitz I heart block, intermittent consecutive non conducted P waves, underlying conduction system disease with 1st degree AV block, RBBB, LPFB Patient admitted with syncope in the setting of known conduction system disease. He has documented heart block on telemetry (mostly Mobitz I, but occasional consecutive non conducted P waves overnight).  He is on no AVN blocking agents. He is currently requiring pressor support. He also has anemia and is receiving blood today.  I think he Randall Reyes require a pacemaker prior to discharge. For now, would monitor on telemetry, his rates have been overall stable while admitted.  Wean pressors as able. Remove central lines when able. Randall Reyes tentatively plan for PPM implant Monday with Dr Caryl Comes. Would prefer that all central lines be removed for at least 24 hours prior to pacemaker implantation.   2.  ?PNA On antibiotics per CCM  3.  AKI Required CRRT initially, now off  Renal function improving   Dr Curt Bears to see  later today. We Randall Reyes follow over the weekend.   Signed, Randall Reyes 02/01/2017 12:15 PM   I have seen and examined this patient with Randall Reyes.  Agree with above, note added to reflect my findings.  On exam, RRR, no murmurs, lungs clear. Presented to the hospital after an episode of syncope requiring CPR. Per EMS, had a pulseless episode. Transferred to [facility] and was intubated. Required pressors and CRRT for renal failure. Is extubated now but remains on pressors. Has trifascicular block and with syncope, would likely benefit from pacemaker implant. As he is still recovering, would prefer to wait through the weekend to allow his central lines to be removed and be off pressors. Plan for Monday. Please keep NPO after midnight Sunday night.  Randall Wulf M. Jearl Soto MD 02/01/2017 2:46 PM

## 2017-02-01 NOTE — Progress Notes (Signed)
Admit: 01/28/2017 LOS: 4  89M with cardiac arrest / VDRF after CTX for CLL, mild hyperkalemia, met acidosis  Subjective:  K normal off CRRT SCr returning towards baseline Good UOP Extubated to [place] Bradycardic this AM  09/20 0701 - 09/21 0700 In: 1641.2 [P.O.:240; I.V.:739.9; NG/GT:100; IV Piggyback:561.3] Out: 1276 [Urine:1125; Emesis/NG output:20]  Filed Weights   01/30/17 0500 01/31/17 0438 02/01/17 0400  Weight: 66.5 kg (146 lb 9.7 oz) 65.5 kg (144 lb 6.4 oz) 63.7 kg (140 lb 6.9 oz)    Scheduled Meds: . Chlorhexidine Gluconate Cloth  6 each Topical Daily  . insulin aspart  0-20 Units Subcutaneous Q4H  . mouth rinse  15 mL Mouth Rinse BID  . pantoprazole (PROTONIX) IV  40 mg Intravenous QHS   Continuous Infusions: . sodium chloride 10 mL/hr at 01/31/17 2000  . sodium chloride 10 mL/hr at 02/01/17 0400  . heparin    . levETIRAcetam Stopped (01/31/17 2244)  . meropenem (MERREM) IV Stopped (01/31/17 2320)  . norepinephrine (LEVOPHED) Adult infusion 15 mcg/min (02/01/17 0507)  . dialysis replacement fluid (prismasate) 500 mL/hr at 01/31/17 0553  . dialysis replacement fluid (prismasate) 500 mL/hr at 01/30/17 1549  . dialysate (PRISMASATE) 1,000 mL/hr at 01/31/17 0444  . sodium chloride 999 mL/hr at 01/29/17 1030   PRN Meds:.Place/Maintain arterial line **AND** sodium chloride, fentaNYL (SUBLIMAZE) injection, fentaNYL (SUBLIMAZE) injection, heparin, heparin, heparin, sodium chloride, sodium chloride flush  Current Labs: reviewed    Physical Exam:  Blood pressure (!) 95/46, pulse (!) 54, temperature 98.5 F (36.9 C), temperature source Oral, resp. rate 11, height 5' 1"  (1.549 m), weight 63.7 kg (140 lb 6.9 oz), SpO2 98 %. Extubated Brady, regular Diminished in bases 2-3+ LEE Nonfocal  A 1. Dialysis Dependent AoCKD after CTX for CLL with WBC > 200 2. Hyperkalemia, stable on 4K bath and reduced HD Rx 3. Metabolic Acidosis on NaHCO3 gtt 4. CLL s/p CTX 9/17; appears rec  rasburicase 5. VDRF 6. S/p Cardiac Arrest  P 1. Doing well after dc CRRT with nl K, good uOP, should return to prev baseline GFR 2. No further recommendations, will sign off   Pearson Grippe MD 02/01/2017, 8:09 AM   Recent Labs Lab 01/31/17 0353 01/31/17 1601 02/01/17 0400  NA 135 134* 136  K 4.0 3.8 3.7  CL 104 104 104  CO2 25 26 25   GLUCOSE 187* 168* 127*  BUN 22* 28* 32*  CREATININE 0.91 1.40* 1.81*  CALCIUM 7.3* 7.1* 7.3*  PHOS 2.3* 3.7 3.3    Recent Labs Lab 01/28/17 0801  01/29/17 0552 01/30/17 0600 02/01/17 0400  WBC 227.7*  < > 124.7* 21.3* 6.7  NEUTROABS 4.6  --   --   --   --   HGB 8.8*  < > 7.5* 7.3* 6.6*  HCT 27.3*  < > 24.0* 22.7* 20.2*  MCV 116.7*  < > 116.5* 111.8* 112.2*  PLT 123*  < > 76* 35* 29*  < > = values in this interval not displayed.

## 2017-02-01 NOTE — Evaluation (Addendum)
Physical Therapy Evaluation Patient Details Name: Randall Reyes MRN: [id number] DOB: October 27, 1935 Today's Date: 02/01/2017   History of Present Illness  Patient is a 81 y/o male who presents with syncopal episode at home requiring CPR and s/p cardiac arrest again at hospital. Intubated 9/18-9/20. CRRT 9/18-9/20. Admitted with tumor lysis syndrome. Possible HCAP - CXR 9/18 with RLL > LLL opacity. PMH includes CLL, HTN, DM,  Clinical Impression  Patient presents with deconditioning and impaired mobility s/p above. Limited to in bed evaluation as pt found to have femoral A-line in LLE limiting hip flexion and thus getting OOB. Pt independent PTA and lives with wife. Anticipate pt will do pretty well with mobility once fem A-line discontinued. Encouraged exercise while in bed to improve strength/mobility. Will follow acutely to further assess functional mobility and make appropriate disposition.     Follow Up Recommendations Other (comment) (TBA with OOB evaluation)    Equipment Recommendations  Other (comment) (TBA)    Recommendations for Other Services OT consult     Precautions / Restrictions Precautions Precautions: Fall Precaution Comments: femoral A-line Restrictions Weight Bearing Restrictions: No      Mobility  Bed Mobility Overal bed mobility: Needs Assistance Bed Mobility: Rolling Rolling: Supervision         General bed mobility comments: Able to roll to right/left for pericare and changing pad. HOB limited due to femoral A-line.  Transfers                 General transfer comment: Deferred secondary to fem A-line.  Ambulation/Gait             General Gait Details: Deferred  Stairs            Wheelchair Mobility    Modified Rankin (Stroke Patients Only)       Balance Overall balance assessment:  (Deferred secondary to restrictions from femoral A-line)                                           Pertinent  Vitals/Pain Pain Assessment: No/denies pain    Home Living Family/patient expects to be discharged to:: Private residence Living Arrangements: Spouse/significant other Available Help at Discharge: Family;Available 24 hours/day Type of Home: House Home Access: Stairs to enter Entrance Stairs-Rails: None Entrance Stairs-Number of Steps: 1 or 14 Home Layout: One level Home Equipment: Cane - single point      Prior Function Level of Independence: Independent         Comments: Drives. Wife cooks.      Hand Dominance   Dominant Hand: Right    Extremity/Trunk Assessment   Upper Extremity Assessment Upper Extremity Assessment: Defer to OT evaluation    Lower Extremity Assessment Lower Extremity Assessment: LLE deficits/detail;RLE deficits/detail RLE Deficits / Details: Able to perform SLR without difficulty, ankle and knee AROm WFL. RLE Sensation:  (WFL) LLE Deficits / Details: Able to perform SLR without difficulty, ankle and knee AROm WFL. LLE Sensation:  [facility])       Communication   Communication: HOH  Cognition Arousal/Alertness: Awake/alert Behavior During Therapy: WFL for tasks assessed/performed Overall Cognitive Status: Within Functional Limits for tasks assessed                                        General Comments  Exercises     Assessment/Plan    PT Assessment Patient needs continued PT services  PT Problem List Decreased strength;Decreased mobility;Decreased balance;Cardiopulmonary status limiting activity       PT Treatment Interventions Therapeutic activities;Gait training;Therapeutic exercise;Patient/family education;Balance training;Functional mobility training;Stair training    PT Goals (Current goals can be found in the Care Plan section)  Acute Rehab PT Goals Patient Stated Goal: to get out of bed PT Goal Formulation: With patient Time For Goal Achievement: 02/15/17 Potential to Achieve Goals: Fair    Frequency  Min 3X/week   Barriers to discharge        Co-evaluation               AM-PAC PT "6 Clicks" Daily Activity  Outcome Measure Difficulty turning over in bed (including adjusting bedclothes, sheets and blankets)?: None Difficulty moving from lying on back to sitting on the side of the bed? : None Difficulty sitting down on and standing up from a chair with arms (e.g., wheelchair, bedside commode, etc,.)?: A Little Help needed moving to and from a bed to chair (including a wheelchair)?: A Lot Help needed walking in hospital room?: A Lot Help needed climbing 3-5 steps with a railing? : Total 6 Click Score: 16    End of Session Equipment Utilized During Treatment: Oxygen Activity Tolerance: Patient tolerated treatment well Patient left: in bed;with call bell/phone within reach;with bed alarm set;with SCD's reapplied Nurse Communication: Mobility status;Other (comment) (femoral A-line and not being able to get pt up.) PT Visit Diagnosis: Muscle weakness (generalized) (M62.81)    Time: 7253-6644 PT Time Calculation (min) (ACUTE ONLY): 24 min   Charges:   PT Evaluation $PT Eval Moderate Complexity: 1 Mod     PT G CodesWray Kearns, PT, DPT [phone]    Marguarite Arbour A Bernadette Armijo 02/01/2017, 11:37 AM

## 2017-02-01 NOTE — Care Management Note (Signed)
Case Management Note  Patient Details  Name: Randall Reyes MRN: [id number] Date of Birth: 01/10/1936  Subjective/Objective:     Pt originally admitted post cardiac arrest              Action/Plan:  PTA from home with wife.  Pt is now extubated and off CRRT.  Pt will need PT eval once medically stable.  CM will continue to follow for discharge needs    Expected Discharge Date:                  Expected Discharge Plan:     In-House Referral:     Discharge planning Services  CM Consult  Post Acute Care Choice:    Choice offered to:     DME Arranged:    DME Agency:     HH Arranged:    HH Agency:     Status of Service:     If discussed at [employer], dates discussed:    Additional Comments:  Maryclare Labrador, RN 02/01/2017, 11:23 AM

## 2017-02-01 NOTE — Progress Notes (Signed)
CRITICAL VALUE ALERT  Critical Value:  Hgb 6.6  Date & Time Notied: 02/01/2017 04:49  Provider Notified: Levada Schilling.  Orders Received/Actions taken: Transfuse 1 unit RBCs

## 2017-02-01 NOTE — Progress Notes (Signed)
Pharmacy Antibiotic Note  Randall Reyes is a 81 y.o. male admitted on 01/28/2017 with sepsis. Pharmacy managing meropenem dosing for HCAP. On D#4 of abx. Now off CRRT. WBC wnl. Afebrile. CrCl ~ 30 mL/min   Plan: Meropenem 1g IV every 12 hours. Complete 7 -day course  Monitor clinical progress   Height: 5\' 1"  (154.9 cm) Weight: 140 lb 6.9 oz (63.7 kg) IBW/kg (Calculated) : 52.3  Temp (24hrs), Avg:98.5 F (36.9 C), Min:98.3 F (36.8 C), Max:98.6 F (37 C)   Recent Labs Lab 01/28/17 0801 01/28/17 1949 01/28/17 2011 01/29/17 0100  01/29/17 0428 01/29/17 0552  01/29/17 1127  01/30/17 0600  01/30/17 1414 01/30/17 1820 01/31/17 0353 01/31/17 1601 02/01/17 0400  WBC 227.7* 149.9*  --   --   --   --  124.7*  --   --   --  21.3*  --   --   --   --   --  6.7  CREATININE 2.66* 3.34*  --   --   < >  --   --   < >  --   < > 1.24  < > 0.93 0.92 0.91 1.40* 1.81*  LATICACIDVEN  --   --  9.2* 5.7*  --  4.7*  --   --  1.7  --   --   --   --   --   --   --   --   < > = values in this interval not displayed.  Estimated Creatinine Clearance: 26.6 mL/min (A) (by C-G formula based on SCr of 1.81 mg/dL (H)).    Allergies  Allergen Reactions  . Aspirin Other (See Comments)    Bleeding ulcers.   . Ibuprofen Other (See Comments)    Bleeding ulcers.    Antimicrobials this admission: Vanc 9/18>9/20 Meropenem 9/18>  Dose adjustments this admission: n/a  Microbiology results: BCx 9/18> ngtd  Respiratory Cx 9/18> ngtd  MRSA PCR negative  Albertina Parr, PharmD., BCPS Clinical Pharmacist Pager [phone]

## 2017-02-02 ENCOUNTER — Inpatient Hospital Stay (HOSPITAL_COMMUNITY): Payer: Medicare Other

## 2017-02-02 DIAGNOSIS — I459 Conduction disorder, unspecified: Secondary | ICD-10-CM

## 2017-02-02 LAB — CBC WITH DIFFERENTIAL/PLATELET
BASOS ABS: 0 10*3/uL (ref 0.0–0.1)
BASOS PCT: 0 %
EOS ABS: 0 10*3/uL (ref 0.0–0.7)
Eosinophils Relative: 0 %
HEMATOCRIT: 22.6 % — AB (ref 39.0–52.0)
HEMOGLOBIN: 7.5 g/dL — AB (ref 13.0–17.0)
Lymphocytes Relative: 51 %
Lymphs Abs: 3.8 10*3/uL (ref 0.7–4.0)
MCH: 34.6 pg — ABNORMAL HIGH (ref 26.0–34.0)
MCHC: 33.2 g/dL (ref 30.0–36.0)
MCV: 104.1 fL — ABNORMAL HIGH (ref 78.0–100.0)
MONOS PCT: 5 %
Monocytes Absolute: 0.4 10*3/uL (ref 0.1–1.0)
NEUTROS ABS: 3.3 10*3/uL (ref 1.7–7.7)
NEUTROS PCT: 44 %
Platelets: 27 10*3/uL — CL (ref 150–400)
RBC: 2.17 MIL/uL — AB (ref 4.22–5.81)
RDW: 19.1 % — ABNORMAL HIGH (ref 11.5–15.5)
WBC: 7.1 10*3/uL (ref 4.0–10.5)

## 2017-02-02 LAB — TYPE AND SCREEN
ABO/RH(D): AB POS
ANTIBODY SCREEN: NEGATIVE
Unit division: 0

## 2017-02-02 LAB — RENAL FUNCTION PANEL
Albumin: 2.5 g/dL — ABNORMAL LOW (ref 3.5–5.0)
Albumin: 2.7 g/dL — ABNORMAL LOW (ref 3.5–5.0)
Anion gap: 6 (ref 5–15)
Anion gap: 5 (ref 5–15)
BUN: 35 mg/dL — AB (ref 6–20)
BUN: 37 mg/dL — ABNORMAL HIGH (ref 6–20)
CHLORIDE: 107 mmol/L (ref 101–111)
CHLORIDE: 107 mmol/L (ref 101–111)
CO2: 22 mmol/L (ref 22–32)
CO2: 24 mmol/L (ref 22–32)
CREATININE: 1.98 mg/dL — AB (ref 0.61–1.24)
CREATININE: 2.01 mg/dL — AB (ref 0.61–1.24)
Calcium: 7.4 mg/dL — ABNORMAL LOW (ref 8.9–10.3)
Calcium: 7.7 mg/dL — ABNORMAL LOW (ref 8.9–10.3)
GFR calc Af Amer: 35 mL/min — ABNORMAL LOW (ref >60)
GFR calc Af Amer: 35 mL/min — ABNORMAL LOW (ref >60)
GFR, EST NON AFRICAN AMERICAN: 30 mL/min — AB (ref >60)
GFR, EST NON AFRICAN AMERICAN: 30 mL/min — AB (ref >60)
GLUCOSE: 179 mg/dL — AB (ref 65–99)
Glucose, Bld: 113 mg/dL — ABNORMAL HIGH (ref 65–99)
Phosphorus: 3.5 mg/dL (ref 2.5–4.6)
Phosphorus: 3.8 mg/dL (ref 2.5–4.6)
Potassium: 4.2 mmol/L (ref 3.5–5.1)
Potassium: 4 mmol/L (ref 3.5–5.1)
SODIUM: 135 mmol/L (ref 135–145)
Sodium: 136 mmol/L (ref 135–145)

## 2017-02-02 LAB — GLUCOSE, CAPILLARY
GLUCOSE-CAPILLARY: 114 mg/dL — AB (ref 65–99)
GLUCOSE-CAPILLARY: 160 mg/dL — AB (ref 65–99)
GLUCOSE-CAPILLARY: 180 mg/dL — AB (ref 65–99)
GLUCOSE-CAPILLARY: 187 mg/dL — AB (ref 65–99)
GLUCOSE-CAPILLARY: 197 mg/dL — AB (ref 65–99)
Glucose-Capillary: 211 mg/dL — ABNORMAL HIGH (ref 65–99)
Glucose-Capillary: 210 mg/dL — ABNORMAL HIGH (ref 65–99)

## 2017-02-02 LAB — CBC
HEMATOCRIT: 22.8 % — AB (ref 39.0–52.0)
Hemoglobin: 7.6 g/dL — ABNORMAL LOW (ref 13.0–17.0)
MCH: 34.9 pg — ABNORMAL HIGH (ref 26.0–34.0)
MCHC: 33.3 g/dL (ref 30.0–36.0)
MCV: 104.6 fL — AB (ref 78.0–100.0)
PLATELETS: 24 10*3/uL — AB (ref 150–400)
RBC: 2.18 MIL/uL — AB (ref 4.22–5.81)
RDW: 19.9 % — ABNORMAL HIGH (ref 11.5–15.5)
WBC: 5.3 10*3/uL (ref 4.0–10.5)

## 2017-02-02 LAB — BPAM RBC
BLOOD PRODUCT EXPIRATION DATE: 201809242359
ISSUE DATE / TIME: 201809210710
Unit Type and Rh: 1700

## 2017-02-02 LAB — MAGNESIUM: Magnesium: 2.3 mg/dL (ref 1.7–2.4)

## 2017-02-02 MED ORDER — ATOVAQUONE 750 MG/5ML PO SUSP
1500.0000 mg | Freq: Every day | ORAL | Status: DC
  Administered 2017-02-03 – 2017-02-08 (×5): 1500 mg via ORAL
  Filled 2017-02-02 (×8): qty 10

## 2017-02-02 MED ORDER — INSULIN ASPART 100 UNIT/ML ~~LOC~~ SOLN
0.0000 [IU] | Freq: Three times a day (TID) | SUBCUTANEOUS | Status: DC
  Administered 2017-02-03 (×3): 4 [IU] via SUBCUTANEOUS
  Administered 2017-02-04: 3 [IU] via SUBCUTANEOUS
  Administered 2017-02-04: 7 [IU] via SUBCUTANEOUS
  Administered 2017-02-04: 4 [IU] via SUBCUTANEOUS
  Administered 2017-02-05: 3 [IU] via SUBCUTANEOUS
  Administered 2017-02-05: 7 [IU] via SUBCUTANEOUS
  Administered 2017-02-05 – 2017-02-06 (×2): 4 [IU] via SUBCUTANEOUS
  Administered 2017-02-06 – 2017-02-07 (×3): 3 [IU] via SUBCUTANEOUS
  Administered 2017-02-07: 4 [IU] via SUBCUTANEOUS
  Administered 2017-02-08: 7 [IU] via SUBCUTANEOUS
  Administered 2017-02-08: 4 [IU] via SUBCUTANEOUS

## 2017-02-02 NOTE — Progress Notes (Signed)
PT Cancellation Note  Patient Details Name: Randall Reyes MRN: [id number] DOB: 25-Jun-1935   Cancelled Treatment:    Reason Eval/Treat Not Completed: Medical issues which prohibited therapy. Pt still with femoral a line, will check back tomorrow as time allows.    [place] 02/02/2017, 2:42 PM

## 2017-02-02 NOTE — Progress Notes (Signed)
Progress Note  Patient Name: Randall Reyes Date of Encounter: 02/02/2017  Primary Cardiologist: Randall Reyes  Subjective   No chest pain or sob. No additional prolonged pauses on tele.  Inpatient Medications    Scheduled Meds: . Chlorhexidine Gluconate Cloth  6 each Topical Daily  . fludrocortisone  0.1 mg Oral Daily  . hydrocortisone sod succinate (SOLU-CORTEF) inj  50 mg Intravenous Q6H  . insulin aspart  0-20 Units Subcutaneous Q4H  . mouth rinse  15 mL Mouth Rinse BID  . pantoprazole (PROTONIX) IV  40 mg Intravenous QHS   Continuous Infusions: . sodium chloride 10 mL/hr at 01/31/17 2000  . sodium chloride 10 mL/hr at 02/01/17 0400  . sodium chloride 50 mL/hr at 02/02/17 0400  . DOPamine 5 mcg/kg/min (02/02/17 0715)  . levETIRAcetam Stopped (02/01/17 2233)  . meropenem (MERREM) IV Stopped (02/01/17 2307)  . norepinephrine (LEVOPHED) Adult infusion 5 mcg/min (02/02/17 0715)   PRN Meds: Place/Maintain arterial line **AND** sodium chloride, fentaNYL (SUBLIMAZE) injection, fentaNYL (SUBLIMAZE) injection, sodium chloride flush   Vital Signs    Vitals:   02/02/17 0700 02/02/17 0715 02/02/17 0730 02/02/17 0836  BP: 103/86     Pulse: (!) 51 (!) 55 61   Resp: 12 16 (!) 0   Temp:    98.4 F (36.9 C)  TempSrc:    Oral  SpO2: 96% 95% 97%   Weight:      Height:        Intake/Output Summary (Last 24 hours) at 02/02/17 0850 Last data filed at 02/02/17 0700  Gross per 24 hour  Intake          1997.42 ml  Output             1475 ml  Net           522.42 ml   Filed Weights   01/31/17 0438 02/01/17 0400 02/02/17 0300  Weight: 144 lb 6.4 oz (65.5 kg) 140 lb 6.9 oz (63.7 kg) 144 lb 2.9 oz (65.4 kg)    Telemetry    nsr - Personally Reviewed  ECG    nsr - Personally Reviewed  Physical Exam   GEN: elderly and frail and acute distress.   Neck: No JVD Cardiac: RRR, no murmurs, rubs, or gallops.  Respiratory: Clear to auscultation bilaterally. GI: Soft,  nontender, non-distended  MS: No edema; No deformity. Neuro:  Nonfocal  Psych: Normal affect   Labs    Chemistry Recent Labs Lab 01/28/17 0801  02/01/17 0400 02/01/17 1651 02/02/17 0400  NA 137  < > 136 136 135  K 4.8  < > 3.7 4.2 4.2  CL 102  < > 104 106 107  CO2 26  < > 25 25 22   GLUCOSE 154*  < > 127* 221* 113*  BUN 31*  < > 32* 33* 35*  CREATININE 2.66*  < > 1.81* 1.94* 1.98*  CALCIUM 8.6*  < > 7.3* 7.5* 7.4*  PROT 7.0  --   --   --   --   ALBUMIN 3.8  < > 2.5* 2.6* 2.5*  AST 21  --   --   --   --   ALT 12*  --   --   --   --   ALKPHOS 112  --   --   --   --   BILITOT 0.5  --   --   --   --   GFRNONAA 21*  < > 34* 31* 30*  GFRAA 25*  < > 39* 36* 35*  ANIONGAP 9  < > 7 5 6   < > = values in this interval not displayed.   Hematology Recent Labs Lab 02/01/17 0400 02/01/17 1305 02/02/17 0400  WBC 6.7 6.4 5.3  RBC 1.80* 2.31*  2.31* 2.18*  HGB 6.6* 8.1* 7.6*  HCT 20.2* 24.3* 22.8*  MCV 112.2* 105.2* 104.6*  MCH 36.7* 35.1* 34.9*  MCHC 32.7 33.3 33.3  RDW 16.0* 20.7* 19.9*  PLT 29* 24* 24*    Cardiac Enzymes Recent Labs Lab 01/28/17 2011  TROPONINI 0.03*   No results for input(s): TROPIPOC in the last 168 hours.   BNPNo results for input(s): BNP, PROBNP in the last 168 hours.   DDimer No results for input(s): DDIMER in the last 168 hours.   Radiology    Dg Chest Port 1 View  Result Date: 02/02/2017 CLINICAL DATA:  Respiratory failure EXAM: PORTABLE CHEST 1 VIEW COMPARISON:  02/01/2017 FINDINGS: Right chest wall port and left jugular temporary dialysis catheter are again seen and stable. Aortic calcifications are seen. Cardiomegaly is again noted. The lungs are well aerated with some degree of vascular congestion. Stable basilar changes are noted bilaterally. IMPRESSION: No significant interval change from the prior exam. Electronically Signed   By: Inez Catalina M.D.   On: 02/02/2017 07:04   Dg Chest Port 1 View  Result Date: 02/01/2017 CLINICAL DATA:   Respiratory failure . EXAM: PORTABLE CHEST 1 VIEW COMPARISON:  01/30/2017. FINDINGS: Interim extubation and removal of NG tube. Bilateral IJ lines in stable position. Stable cardiomegaly . Bilateral lower lobe pulmonary infiltrates with basilar atelectasis again noted. No interim change. No pleural effusion or pneumothorax . IMPRESSION: 1. Interim extubation removal of NG tube. Bilateral IJ lines in stable position. 2. Bilateral lower lobe pulmonary infiltrates and atelectasis again noted. No interim change. 3. Stable cardiomegaly. Electronically Signed   By: Marcello Moores  Register   On: 02/01/2017 06:24    Cardiac Studies   none  Patient Profile     81 y.o. male admitted with syncope and found to have high grade AV block.  Assessment & Plan    1. Syncope due to high grade AV block - he has had no additional syncope or prolonged pauses. He remains on pressors. Awake and alert. For PPM on Monday. He has an indwelling left IJ central line and will need this removed before any PPM is inserted.   For questions or updates, please contact [place] Please consult [email or web address] for contact info under Cardiology/STEMI.      Signed, Cristopher Peru, MD  02/02/2017, 8:50 AM  Patient ID: Randall Reyes, male   DOB: [birth year], 81 y.o.   MRN: [id number]

## 2017-02-02 NOTE — Progress Notes (Signed)
[facility] / CRITICAL CARE MEDICINE   Name: Randall Reyes MRN: [id number] DOB: [birth year]    ADMISSION DATE:  01/28/2017 CONSULTATION DATE:  01/28/2017  REFERRING MD:  Dr. Dayna Barker   CHIEF COMPLAINT:  Syncope   HISTORY OF PRESENT ILLNESS:   81 year old male with PMH of Anemia, CLL (diagnosed 2006, treated with FCR in 2010 -- only received 3 cycles due to prolonged pancytopenia, 2015 started on Imbruica, Treated with 1 cycle BR 6/0/1093- 2/35/5732 complicated by severe tumor lysis syndrome (despite allopurinol and rasburicase) requiring hospitalization and dialysis), DM, HTN  Presents to ED on 9/17 after syncopal episode at home requiring brief CPR, family reports that patient underwent chemotherapy treatment earlier in the day. Upon arrival to ED patient was noted to be alert, however during stay experienced another episode on unresponsiveness and was noted to be pulseless. After one round of CPR ROSC was achieved. WBC 149.9, K 6.0, Uric Acid <0.5. Patient transferred to [facility] for further management. PCCM asked to admit.     SUBJECTIVE: CHB on dopa and levo. For [facility] 9/24  VITAL SIGNS: BP (!) 102/51   Pulse (!) 47   Temp 98.4 F (36.9 C) (Oral)   Resp 13   Ht 5\' 1"  (1.549 m)   Wt 144 lb 2.9 oz (65.4 kg)   SpO2 96%   BMI 27.24 kg/m   HEMODYNAMICS:    VENTILATOR SETTINGS:    INTAKE / OUTPUT: I/O last 3 completed shifts: In: 2727.3 [P.O.:240; I.V.:1590.3; Blood:274; IV [id number]:237] Out: 2125 [Urine:2125]  PHYSICAL EXAMINATION: General:  Frail male in nad HEENT: Mild jvd PSY:nl affect Neuro: intact CV: hsir ir PULM: even/non-labored, lungs bilaterally decreased SE:GBTD, non-tender, bsx4 active  Extremities: warm/dry, - edema  Skin: no rashes or lesions      LABS:  BMET  Recent Labs Lab 02/01/17 0400 02/01/17 1651 02/02/17 0400  NA 136 136 135  K 3.7 4.2 4.2  CL 104 106 107  CO2 25 25 22   BUN 32* 33* 35*  CREATININE 1.81* 1.94*  1.98*  GLUCOSE 127* 221* 113*    Electrolytes  Recent Labs Lab 01/31/17 0353  02/01/17 0400 02/01/17 1651 02/02/17 0400  CALCIUM 7.3*  < > 7.3* 7.5* 7.4*  MG 2.4  --  2.3  --  2.3  PHOS 2.3*  < > 3.3 3.1 3.5  < > = values in this interval not displayed.  CBC  Recent Labs Lab 02/01/17 0400 02/01/17 1305 02/02/17 0400  WBC 6.7 6.4 5.3  HGB 6.6* 8.1* 7.6*  HCT 20.2* 24.3* 22.8*  PLT 29* 24* 24*    Coag's No results for input(s): APTT, INR in the last 168 hours.  Sepsis Markers  Recent Labs Lab 01/29/17 0100 01/29/17 0428 01/29/17 1127  LATICACIDVEN 5.7* 4.7* 1.7    ABG  Recent Labs Lab 01/29/17 1113 01/29/17 1433 01/30/17 0429  PHART 7.439 7.444 7.476*  PCO2ART 39.2 40.2 36.2  PO2ART 166.0* 221.0* 173*    Liver Enzymes  Recent Labs Lab 01/28/17 0801  02/01/17 0400 02/01/17 1651 02/02/17 0400  AST 21  --   --   --   --   ALT 12*  --   --   --   --   ALKPHOS 112  --   --   --   --   BILITOT 0.5  --   --   --   --   ALBUMIN 3.8  < > 2.5* 2.6* 2.5*  < > = values  in this interval not displayed.  Cardiac Enzymes  Recent Labs Lab 01/28/17 2011  TROPONINI 0.03*    Glucose  Recent Labs Lab 02/01/17 1137 02/01/17 1538 02/01/17 1958 02/01/17 2346 02/02/17 0323 02/02/17 0839  GLUCAP 258* 220* 260* 144* 114* 187*    Imaging Dg Chest Port 1 View  Result Date: 02/02/2017 CLINICAL DATA:  Respiratory failure EXAM: PORTABLE CHEST 1 VIEW COMPARISON:  02/01/2017 FINDINGS: Right chest wall port and left jugular temporary dialysis catheter are again seen and stable. Aortic calcifications are seen. Cardiomegaly is again noted. The lungs are well aerated with some degree of vascular congestion. Stable basilar changes are noted bilaterally. IMPRESSION: No significant interval change from the prior exam. Electronically Signed   By: Inez Catalina M.D.   On: 02/02/2017 07:04     STUDIES:  CXR 9/17 > No acute  CXR 9/18 > RLL opacity > LLL EEG 9/18  >diffuse changes 2d 9/18>>ef 55% g1dd  CULTURES: Sputum 9/18 >nl flora BC 9/18 >ngtd  ANTIBIOTICS: Vanc  9/18 > 9/21 Merrem 9/18 >   SIGNIFICANT EVENTS: 9/17 > Presents to ED, multiple codes  LINES/TUBES: Right Chest Port > L IJ CVL 9/17 > 9/22 L femoral A line 9/18 > Et dc 9/20    DISCUSSION: 81 year old male with CLL presents to ED on 9/18 with syncopal event. K 6.0, WBC 149.9. Suspected to be in Tumor Lysis. CRRT started 9/18 with vast improvement. O2 needs minimal. Extubated 9/20. 9/21 still on pressors, hgb 6.7. 9/22 CHB on dopa and levo, for PPM Monday.  ASSESSMENT / PLAN:  PULMONARY A: Respiratory Insufficiency - s/p intubation 9/18 Resp alkalosis Possible HCAP - CXR 9/18 with RLL > LLL opacity P:   Extubated 9/20 Bronchial hygiene  Send sputum culture On empiric abx CXR in AM   CARDIOVASCULAR A:  Hypotension - shock? A.Fib Bradycardia  Aortic Regurgitation  G1DD (EF 55-60 on 06/19/16)  H/O HTN  P:  Cardiac Monitoring On vasopressors complicated by CHB Can dc aline in near future if remains stable. For PPM 9/24 per cards.    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 (base crt 2.0-2.2) s/p 3L NS in ED  AGMA P:   Nephrology following, CRRT 9/18  stopped 9/20 Will need to dc left IJ HD cath   GASTROINTESTINAL A:   GI prophylaxis P:    PPI  PO diet  HEMATOLOGIC  Recent Labs  02/01/17 1305 02/02/17 0400  HGB 8.1* 7.6*    A:   Tumor Lysis Syndrome  Chronic Lymphocytic Leukemia  -Began Obinutuzumab + Chlorambucil 9/17 with Rasburicase  H/O Complications/Poor tolerance with Chemotherapy  Chronic anemia and thrombocytopenia - due to chemo Anemia P:   uric acid  level 0.5 - no need additional rasburicase Follow CBC Transfuse for Hgb<7.0  INFECTIOUS A:   Possible HCAP - CXR 9/18 with RLL > LLL opacity.  At risk MDR given chemo / immunocompromised host P:   Assess sputum culture nl flora , blood culture >>NGTD Started merrem on 9/18  ENDOCRINE CBG (last 3)   Recent Labs  02/01/17 2346 02/02/17 0323 02/02/17 0839  GLUCAP 144* 114* 187*     A:  DM   P:   Trend Glucose  SSI   NEUROLOGIC A:   Acute Encephalopathy - remains unresponsive AM 9/18 despite no sedation ? szs activity 9/18 and started on keppra P:   RASS goal: 1 Fentanyl PRN   EEG with some diffuse changes 9/19 decrease keppra to 750 mg q12h while on CRRT, low threshold to dc keppra.  FAMILY  - Updates: Family updated 9/22.    - Inter-disciplinary family meet or Palliative Care meeting due by: 02/05/2017   CC time: 30 min.   Richardson Landry Minor ACNP Maryanna Shape PCCM Pager [phone] till 3 pm If no answer page [phone] 02/02/2017, 10:20 AM   STAFF NOTE: I, Merrie Roof, MD FACP have personally reviewed patient's available data, including medical history, events of note, physical examination and test results as part of my evaluation. I have discussed with resident/NP and other care providers such as pharmacist, RN and RRT. In addition, I personally evaluated patient and elicited key findings of: awake, alert, no distress remains off vent, catheter remains without erythema, lungs coarse mild, abdo soft, CHB, variable HR, on levophed and dop at 10, would prefer the dop to be at 5-8 mics for beta affect, can use levophed for additional MAP support, appreciate cards EP help, for PPM Monday, would dc line neck prior, renal fxn plat flat, urine output adequate, may need platy transfusion pre PPM, no bleeding, follow hgb, may need transfusion, cbc in pm , cortisol very low, maintain stress steroids and florinef, ABX to stop date The patient is critically ill with multiple organ  systems failure and requires high complexity decision making for assessment and support, frequent evaluation and titration of therapies, application of advanced monitoring technologies and extensive interpretation of multiple databases.   Critical Care Time devoted to patient care services described in this note is 30 Minutes. This time reflects time of care of this signee: Merrie Roof, MD FACP. This critical care time does not reflect procedure time, or teaching time or supervisory time of PA/NP/Med student/Med Resident etc but could involve care discussion time. Rest per NP/medical resident whose note is outlined above and that I agree with   Lavon Paganini. Titus Mould, MD, [place] Pgr: [place] [facility] 02/02/2017 11:17 AM

## 2017-02-03 DIAGNOSIS — I442 Atrioventricular block, complete: Principal | ICD-10-CM

## 2017-02-03 LAB — BASIC METABOLIC PANEL
ANION GAP: 7 (ref 5–15)
BUN: 40 mg/dL — ABNORMAL HIGH (ref 6–20)
CO2: 22 mmol/L (ref 22–32)
Calcium: 7.5 mg/dL — ABNORMAL LOW (ref 8.9–10.3)
Chloride: 107 mmol/L (ref 101–111)
Creatinine, Ser: 1.98 mg/dL — ABNORMAL HIGH (ref 0.61–1.24)
GFR calc Af Amer: 35 mL/min — ABNORMAL LOW (ref >60)
GFR calc non Af Amer: 30 mL/min — ABNORMAL LOW (ref >60)
GLUCOSE: 173 mg/dL — AB (ref 65–99)
POTASSIUM: 4.1 mmol/L (ref 3.5–5.1)
Sodium: 136 mmol/L (ref 135–145)

## 2017-02-03 LAB — CULTURE, BLOOD (ROUTINE X 2)
Culture: NO GROWTH
Culture: NO GROWTH
SPECIAL REQUESTS: ADEQUATE
SPECIAL REQUESTS: ADEQUATE

## 2017-02-03 LAB — GLUCOSE, CAPILLARY
GLUCOSE-CAPILLARY: 169 mg/dL — AB (ref 65–99)
Glucose-Capillary: 169 mg/dL — ABNORMAL HIGH (ref 65–99)
Glucose-Capillary: 197 mg/dL — ABNORMAL HIGH (ref 65–99)
Glucose-Capillary: 158 mg/dL — ABNORMAL HIGH (ref 65–99)

## 2017-02-03 LAB — RENAL FUNCTION PANEL
Albumin: 2.7 g/dL — ABNORMAL LOW (ref 3.5–5.0)
Anion gap: 6 (ref 5–15)
BUN: 40 mg/dL — ABNORMAL HIGH (ref 6–20)
CHLORIDE: 108 mmol/L (ref 101–111)
CO2: 24 mmol/L (ref 22–32)
CREATININE: 1.93 mg/dL — AB (ref 0.61–1.24)
Calcium: 7.8 mg/dL — ABNORMAL LOW (ref 8.9–10.3)
GFR calc non Af Amer: 31 mL/min — ABNORMAL LOW (ref >60)
GFR, EST AFRICAN AMERICAN: 36 mL/min — AB (ref >60)
Glucose, Bld: 129 mg/dL — ABNORMAL HIGH (ref 65–99)
Phosphorus: 4 mg/dL (ref 2.5–4.6)
Potassium: 3.7 mmol/L (ref 3.5–5.1)
Sodium: 138 mmol/L (ref 135–145)

## 2017-02-03 LAB — CBC
HEMATOCRIT: 22 % — AB (ref 39.0–52.0)
HEMOGLOBIN: 7.3 g/dL — AB (ref 13.0–17.0)
MCH: 34.8 pg — AB (ref 26.0–34.0)
MCHC: 33.2 g/dL (ref 30.0–36.0)
MCV: 104.8 fL — ABNORMAL HIGH (ref 78.0–100.0)
Platelets: 27 10*3/uL — CL (ref 150–400)
RBC: 2.1 MIL/uL — ABNORMAL LOW (ref 4.22–5.81)
RDW: 18.7 % — ABNORMAL HIGH (ref 11.5–15.5)
WBC: 6 10*3/uL (ref 4.0–10.5)

## 2017-02-03 MED ORDER — SODIUM CHLORIDE 0.9 % IV SOLN: Freq: Once | INTRAVENOUS | Status: DC

## 2017-02-03 NOTE — Progress Notes (Addendum)
[facility] / CRITICAL CARE MEDICINE   Name: Randall Reyes MRN: [id number] DOB: 06-12-1935    ADMISSION DATE:  01/28/2017 CONSULTATION DATE:  01/28/2017  REFERRING MD:  Dr. Dayna Barker   CHIEF COMPLAINT:  Syncope   HISTORY OF PRESENT ILLNESS:   81 year old male with PMH of Anemia, CLL (diagnosed 2006, treated with FCR in 2010 -- only received 3 cycles due to prolonged pancytopenia, 2015 started on Imbruica, Treated with 1 cycle BR 08/13/3242- 0/02/2724 complicated by severe tumor lysis syndrome (despite allopurinol and rasburicase) requiring hospitalization and dialysis), DM, HTN  Presents to ED on 9/17 after syncopal episode at home requiring brief CPR, family reports that patient underwent chemotherapy treatment earlier in the day. Upon arrival to ED patient was noted to be alert, however during stay experienced another episode on unresponsiveness and was noted to be pulseless. After one round of CPR ROSC was achieved. WBC 149.9, K 6.0, Uric Acid <0.5. Patient transferred to [facility] for further management. PCCM asked to admit.     SUBJECTIVE: CHB on dopa and weaning. For [facility] 9/24  VITAL SIGNS: BP (!) 89/57   Pulse 80   Temp (!) 97.4 F (36.3 C) (Oral)   Resp 13   Ht 5\' 1"  (1.549 m)   Wt 148 lb 2.4 oz (67.2 kg)   SpO2 92%   BMI 27.99 kg/m   HEMODYNAMICS:    VENTILATOR SETTINGS:    INTAKE / OUTPUT: I/O last 3 completed shifts: In: 4013.7 [P.O.:1230; I.V.:2053.7; IV Piggyback:730] Out: 2275 [Urine:2275]  PHYSICAL EXAMINATION: General:  NAD at rest HEENT: Mild JVD PSY:Nl affect Neuro: Intact CV: HAR 61 chb PULM: even/non-labored, lungs bilaterally decreased  DG:UYQI, non-tender, bsx4 active  Extremities: warm/dry,-edema  Skin: no rashes or lesions       LABS:  BMET  Recent Labs Lab 02/02/17 0400 02/02/17 1705 02/03/17 0456  NA 135 136 136  K 4.2 4.0 4.1  CL 107 107 107  CO2 22 24 22   BUN 35* 37* 40*  CREATININE 1.98* 2.01* 1.98*   GLUCOSE 113* 179* 173*    Electrolytes  Recent Labs Lab 01/31/17 0353  02/01/17 0400 02/01/17 1651 02/02/17 0400 02/02/17 1705 02/03/17 0456  CALCIUM 7.3*  < > 7.3* 7.5* 7.4* 7.7* 7.5*  MG 2.4  --  2.3  --  2.3  --   --   PHOS 2.3*  < > 3.3 3.1 3.5 3.8  --   < > = values in this interval not displayed.  CBC  Recent Labs Lab 02/02/17 0400 02/02/17 2210 02/03/17 0456  WBC 5.3 7.1 6.0  HGB 7.6* 7.5* 7.3*  HCT 22.8* 22.6* 22.0*  PLT 24* 27* 27*    Coag's No results for input(s): APTT, INR in the last 168 hours.  Sepsis Markers  Recent Labs Lab 01/29/17 0100 01/29/17 0428 01/29/17 1127  LATICACIDVEN 5.7* 4.7* 1.7    ABG  Recent Labs Lab 01/29/17 1113 01/29/17 1433 01/30/17 0429  PHART 7.439 7.444 7.476*  PCO2ART 39.2 40.2 36.2  PO2ART 166.0* 221.0* 173*    Liver Enzymes  Recent Labs Lab 01/28/17 0801  02/01/17 1651 02/02/17 0400 02/02/17 1705  AST 21  --   --   --   --   ALT 12*  --   --   --   --   ALKPHOS 112  --   --   --   --   BILITOT 0.5  --   --   --   --  ALBUMIN 3.8  < > 2.6* 2.5* 2.7*  < > = values in this interval not displayed.  Cardiac Enzymes  Recent Labs Lab 01/28/17 2011  TROPONINI 0.03*    Glucose  Recent Labs Lab 02/02/17 1608 02/02/17 2032 02/02/17 2216 02/03/17 0001 02/03/17 0356 02/03/17 0833  GLUCAP 160* 211* 210* 180* 169* 169*    Imaging No results found.   STUDIES:  CXR 9/17 > No acute  CXR 9/18 > RLL opacity > LLL EEG 9/18 >diffuse changes 2d 9/18>>ef 55% g1dd  CULTURES: Sputum 9/18 >nl flora BC 9/18 >ngtd  ANTIBIOTICS: Vanc  9/18 > 9/21 Merrem 9/18 >   SIGNIFICANT EVENTS: 9/17 > Presents to ED, multiple codes  LINES/TUBES: Right Chest Port > L IJ CVL 9/17 > 9/22 L femoral A line 9/18 > Et dc 9/20    DISCUSSION: 81 year old male with CLL presents to ED on 9/18 with syncopal event. K 6.0, WBC 149.9. Suspected to be in Tumor Lysis. CRRT started 9/18 with vast improvement. O2  needs minimal. Extubated 9/20. 9/21 still on pressors, hgb 6.7. 9/22 CHB on dopa and off levo, for PPM Monday.  ASSESSMENT / PLAN:  PULMONARY A: Respiratory Insufficiency - s/p intubation 9/18 Resp alkalosis Possible HCAP - CXR 9/18 with RLL > LLL opacity P:   Extubated 9/20 Bronchial hygiene  Send sputum culture On empiric abx    CARDIOVASCULAR A:  Hypotension - shock? A.Fib?CHB Bradycardia  Aortic Regurgitation  G1DD (EF 55-60 on 06/19/16)  H/O HTN  P:  Cardiac Monitoring On vasopressors complicated by CHB Can dc aline in near future if remains stable. For PPM 9/24 per cards.    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 (base crt 2.0-2.2) s/p 3L NS in ED  AGMA P:   Nephrology following, CRRT 9/18  stopped 9/20 Will need to dc left IJ HD cath prior to [facility] placement   GASTROINTESTINAL A:   GI prophylaxis P:    PPI  PO diet  HEMATOLOGIC  Recent Labs  02/02/17 2210 02/03/17 0456  HGB 7.5* 7.3*    A:   Tumor Lysis Syndrome  Chronic Lymphocytic Leukemia  -Began Obinutuzumab + Chlorambucil 9/17 with Rasburicase  H/O Complications/Poor tolerance with Chemotherapy  Chronic anemia and thrombocytopenia - due to chemo Anemia P:   uric acid level 0.5 - no need additional rasburicase Follow CBC Transfuse for Hgb<7.0  INFECTIOUS A:   Possible HCAP - CXR 9/18 with RLL > LLL opacity.  At risk MDR given chemo / immunocompromised host P:   Assess sputum culture nl flora , blood culture >>NGTD Started merrem on 9/18  ENDOCRINE CBG (last 3)   Recent Labs  02/03/17 0001 02/03/17 0356 02/03/17 0833  GLUCAP 180* 169* 169*     A:   DM   P:   Trend Glucose  SSI    NEUROLOGIC A:   Acute Encephalopathy - remains unresponsive AM 9/18 despite no sedation ? szs activity 9/18 and started on keppra P:   RASS goal: 1 Fentanyl PRN   EEG with some diffuse changes 9/19 decrease keppra to 750 mg q12h while on CRRT, low threshold to dc keppra with in 2  weeks.  FAMILY  - Updates: Family updated 9/23.    - Inter-disciplinary family meet or Palliative Care meeting due by: 02/05/2017   CC time: 30 min.   Richardson Landry Minor ACNP [facility] Shape PCCM Pager [phone] till 3 pm If no answer page [phone] 02/03/2017, 10:27 AM   STAFF NOTE: I, Merrie Roof, MD FACP have personally reviewed patient's available data, including medical history, events of note, physical examination and test results as part of my evaluation. I have discussed with resident/NP and other care providers such as pharmacist, RN and RRT. In addition, I personally evaluated patient and elicited key findings of: awake in bed, no distress, line bloody left, CTA lungs, abdo soft, min edema, remains on dopamine, levo support, fortunately he has done well with response to dopamine, wil Tx plat then dc HD cath left in preporation for pacer in am, may need further Tx plat for procedure per EP, no new pcxr today, would favor lasix soon to even balance, but will wait for now given BP and hr support needed, also may need prbc, continued ABX course to stop date in place, chem inam, [pcp prevention may be able to change back to batrim pending renal fxn The patient is critically ill with multiple organ systems failure and requires high complexity decision making for assessment and support, frequent evaluation and titration of therapies, application of advanced monitoring technologies and extensive interpretation of multiple databases.   Critical Care Time devoted to patient care services described in this note is 30 Minutes. This time reflects time of care of this signee: Merrie Roof, MD FACP. This critical care  time does not reflect procedure time, or teaching time or supervisory time of PA/NP/Med student/Med Resident etc but could involve care discussion time. Rest per NP/medical resident whose note is outlined above and that I agree with   Lavon Paganini. Titus Mould, MD, [place] Pgr: [place] [facility] 02/03/2017 11:42 AM

## 2017-02-03 NOTE — Progress Notes (Signed)
Progress Note  Patient Name: Randall Reyes Date of Encounter: 02/03/2017  Primary Cardiologist: Jacinta Shoe  Subjective   Feels well. No chest pain. Appetite down.   Inpatient Medications    Scheduled Meds: . atovaquone  1,500 mg Oral Q breakfast  . Chlorhexidine Gluconate Cloth  6 each Topical Daily  . fludrocortisone  0.1 mg Oral Daily  . hydrocortisone sod succinate (SOLU-CORTEF) inj  50 mg Intravenous Q6H  . insulin aspart  0-20 Units Subcutaneous TID WC  . mouth rinse  15 mL Mouth Rinse BID  . pantoprazole (PROTONIX) IV  40 mg Intravenous QHS   Continuous Infusions: . sodium chloride 10 mL/hr at 01/31/17 2000  . sodium chloride 10 mL/hr at 02/01/17 0400  . sodium chloride 50 mL/hr at 02/03/17 0847  . DOPamine 5 mcg/kg/min (02/03/17 4196)  . levETIRAcetam 750 mg (02/03/17 1001)  . meropenem (MERREM) IV Stopped (02/02/17 2322)  . norepinephrine (LEVOPHED) Adult infusion Stopped (02/02/17 1600)   PRN Meds: Place/Maintain arterial line **AND** sodium chloride, sodium chloride flush   Vital Signs    Vitals:   02/03/17 0730 02/03/17 0800 02/03/17 0831 02/03/17 0900  BP: (!) 108/53 (!) 95/55  (!) 105/57  Pulse: (!) 55 72  75  Resp: 10 13  12   Temp:   (!) 97.4 F (36.3 C)   TempSrc:   Oral   SpO2: 97% 95%  97%  Weight:      Height:        Intake/Output Summary (Last 24 hours) at 02/03/17 1004 Last data filed at 02/03/17 0900  Gross per 24 hour  Intake          2819.63 ml  Output             1525 ml  Net          1294.63 ml   Filed Weights   02/01/17 0400 02/02/17 0300 02/03/17 0500  Weight: 140 lb 6.9 oz (63.7 kg) 144 lb 2.9 oz (65.4 kg) 148 lb 2.4 oz (67.2 kg)    Telemetry    nsr with AVWB - Personally Reviewed  ECG    NSR with RBBB - Personally Reviewed  Physical Exam   GEN: No acute distress.   Neck: 6 cm JVD, Left IJ central line Cardiac: RRR, no murmurs, rubs, or gallops.  Respiratory: scattered rales. GI: Soft, nontender,  non-distended  MS: No edema; No deformity. Neuro:  Nonfocal  Psych: Normal affect   Labs    Chemistry Recent Labs Lab 01/28/17 0801  02/01/17 1651 02/02/17 0400 02/02/17 1705 02/03/17 0456  NA 137  < > 136 135 136 136  K 4.8  < > 4.2 4.2 4.0 4.1  CL 102  < > 106 107 107 107  CO2 26  < > 25 22 24 22   GLUCOSE 154*  < > 221* 113* 179* 173*  BUN 31*  < > 33* 35* 37* 40*  CREATININE 2.66*  < > 1.94* 1.98* 2.01* 1.98*  CALCIUM 8.6*  < > 7.5* 7.4* 7.7* 7.5*  PROT 7.0  --   --   --   --   --   ALBUMIN 3.8  < > 2.6* 2.5* 2.7*  --   AST 21  --   --   --   --   --   ALT 12*  --   --   --   --   --   ALKPHOS 112  --   --   --   --   --  BILITOT 0.5  --   --   --   --   --   GFRNONAA 21*  < > 31* 30* 30* 30*  GFRAA 25*  < > 36* 35* 35* 35*  ANIONGAP 9  < > 5 6 5 7   < > = values in this interval not displayed.   Hematology Recent Labs Lab 02/02/17 0400 02/02/17 2210 02/03/17 0456  WBC 5.3 7.1 6.0  RBC 2.18* 2.17* 2.10*  HGB 7.6* 7.5* 7.3*  HCT 22.8* 22.6* 22.0*  MCV 104.6* 104.1* 104.8*  MCH 34.9* 34.6* 34.8*  MCHC 33.3 33.2 33.2  RDW 19.9* 19.1* 18.7*  PLT 24* 27* 27*    Cardiac Enzymes Recent Labs Lab 01/28/17 2011  TROPONINI 0.03*   No results for input(s): TROPIPOC in the last 168 hours.   BNPNo results for input(s): BNP, PROBNP in the last 168 hours.   DDimer No results for input(s): DDIMER in the last 168 hours.   Radiology    Dg Chest Port 1 View  Result Date: 02/02/2017 CLINICAL DATA:  Respiratory failure EXAM: PORTABLE CHEST 1 VIEW COMPARISON:  02/01/2017 FINDINGS: Right chest wall port and left jugular temporary dialysis catheter are again seen and stable. Aortic calcifications are seen. Cardiomegaly is again noted. The lungs are well aerated with some degree of vascular congestion. Stable basilar changes are noted bilaterally. IMPRESSION: No significant interval change from the prior exam. Electronically Signed   By: Inez Catalina M.D.   On: 02/02/2017  07:04    Cardiac Studies   none  Patient Profile     81 y.o. male admitted with syncope/CPR due to prolonged heart block, hypotension, AMS, now better.   Assessment & Plan    1. Transient CHB with no escape - PPM is indicated. Tentatively planned for tomorrow if off of pressors. He may require a platelet transfusion.  2. Hypotension - he is off of levophed but still on dopamine. Hopefully he can be weaned off. 3. CLL - he is apparently clinically stable after chemotherapy. 4. AI - mild on exam. Mild by echo. 5. Thrombocytopenia - platelets have been stable in the hospital. He will likely need a platelet transfusion prior to PPM.  For questions or updates, please contact [place] Please consult [email or web address] for contact info under Cardiology/STEMI.      Signed, Cristopher Peru, MD  02/03/2017, 10:04 AM  Patient ID: Randall Reyes, male   DOB: Oct 01, 1935, 81 y.o.   MRN: [id number]

## 2017-02-03 NOTE — Progress Notes (Signed)
Physical Therapy Treatment Patient Details Name: Randall Reyes MRN: [id number] DOB: 07/15/1935 Today's Date: 02/03/2017    History of Present Illness Patient is a 81 y/o male who presents with syncopal episode at home requiring CPR and s/p cardiac arrest again at hospital. Intubated 9/18-9/20. CRRT 9/18-9/20. Admitted with tumor lysis syndrome. Possible HCAP - CXR 9/18 with RLL > LLL opacity. PMH includes CLL, HTN, DM,    PT Comments    Session focused on therex as femoral A-Line still present; Performed UE exercises well with demo cues; Performed bilateral ankle pumps, quad sets, gluteal sets with demo cues and some tactile cueing for form; Initiated heel slides mistakenly on L side -- pt performed them without pain; As soon as aware of mistake, I informed the patient, and reinforced that it is the LLE with the line, and that we are keeping the L hip still, and that to flex it was a mistake; I apologized for the error; he verbalized understanding; after that, I decided not to pursue heel slides on R side, simply to avoid confusion; I informed RN, who assessed the line and informed me it did not look out of the ordinary   Follow Up Recommendations  Other (comment) (TBA with OOB evaluation)     Equipment Recommendations  Other (comment) (TBA)    Recommendations for Other Services OT consult     Precautions / Restrictions Precautions Precautions: Fall Precaution Comments: femoral A-line    Mobility  Bed Mobility                  Transfers                 General transfer comment: Deferred secondary to fem A-line.  Ambulation/Gait                 Stairs            Wheelchair Mobility    Modified Rankin (Stroke Patients Only)       Balance                                            Cognition Arousal/Alertness: Awake/alert Behavior During Therapy: WFL for tasks assessed/performed Overall Cognitive Status: Within  Functional Limits for tasks assessed                                        Exercises General Exercises - Upper Extremity Shoulder Flexion: AROM;Both;20 reps;Supine;Other (comment) (second set of 10 using PNF diagonals) Shoulder Horizontal ABduction: AROM;Both;10 reps;Other (comment) (with scap retraction, pressing elbows/shoulders into bed) Elbow Flexion: AROM;Both;10 reps Elbow Extension: AROM;Both;10 reps General Exercises - Lower Extremity Ankle Circles/Pumps: AROM;Both;20 reps Quad Sets: AROM;Both;10 reps Gluteal Sets: AROM;Both;10 reps Heel Slides: AROM;Left (Initiated, and it was a mistake; see discussion in PT comments)    General Comments General comments (skin integrity, edema, etc.): Session conducted on Room air, and O2 sats remained in mid 90%s      Pertinent Vitals/Pain Pain Assessment: No/denies pain    Home Living                      Prior Function            PT Goals (current goals can now be found in the care plan  section) Acute Rehab PT Goals Patient Stated Goal: to get out of bed PT Goal Formulation: With patient Time For Goal Achievement: 02/15/17 Potential to Achieve Goals: Fair Progress towards PT goals: Progressing toward goals    Frequency    Min 3X/week      PT Plan Current plan remains appropriate    Co-evaluation              AM-PAC PT "6 Clicks" Daily Activity  Outcome Measure  Difficulty turning over in bed (including adjusting bedclothes, sheets and blankets)?: None Difficulty moving from lying on back to sitting on the side of the bed? : None Difficulty sitting down on and standing up from a chair with arms (e.g., wheelchair, bedside commode, etc,.)?: A Little Help needed moving to and from a bed to chair (including a wheelchair)?: A Lot Help needed walking in hospital room?: A Lot Help needed climbing 3-5 steps with a railing? : Total 6 Click Score: 16    End of Session   Activity Tolerance:  Patient tolerated treatment well Patient left: in bed;with call bell/phone within reach;with bed alarm set;with SCD's reapplied Nurse Communication: Other (comment) (Mistake in flexing L hip) PT Visit Diagnosis: Muscle weakness (generalized) (M62.81)     Time: 5003-7048 PT Time Calculation (min) (ACUTE ONLY): 12 min  Charges:  $Therapeutic Exercise: 8-22 mins                    G Codes:       Roney Marion, PT  Acute Rehabilitation Services Pager [phone] Office [place] 02/03/2017, 10:45 AM

## 2017-02-03 NOTE — Progress Notes (Signed)
RN notified of pt temp of 97.4 F orally 

## 2017-02-04 ENCOUNTER — Other Ambulatory Visit (HOSPITAL_COMMUNITY): Payer: Medicare Other

## 2017-02-04 ENCOUNTER — Encounter (HOSPITAL_COMMUNITY)
Admission: EM | Disposition: A | Discharge status: Home Health | Payer: Self-pay | Source: Home / Self Care | Attending: Family Medicine

## 2017-02-04 ENCOUNTER — Ambulatory Visit (HOSPITAL_COMMUNITY): Payer: Medicare Other

## 2017-02-04 DIAGNOSIS — E883 Tumor lysis syndrome: Secondary | ICD-10-CM

## 2017-02-04 LAB — RENAL FUNCTION PANEL
ALBUMIN: 2.5 g/dL — AB (ref 3.5–5.0)
ANION GAP: 5 (ref 5–15)
ANION GAP: 8 (ref 5–15)
Albumin: 2.7 g/dL — ABNORMAL LOW (ref 3.5–5.0)
BUN: 39 mg/dL — AB (ref 6–20)
BUN: 40 mg/dL — ABNORMAL HIGH (ref 6–20)
CALCIUM: 7.5 mg/dL — AB (ref 8.9–10.3)
CHLORIDE: 108 mmol/L (ref 101–111)
CHLORIDE: 107 mmol/L (ref 101–111)
CO2: 24 mmol/L (ref 22–32)
CO2: 21 mmol/L — ABNORMAL LOW (ref 22–32)
CREATININE: 1.78 mg/dL — AB (ref 0.61–1.24)
Calcium: 7.6 mg/dL — ABNORMAL LOW (ref 8.9–10.3)
Creatinine, Ser: 1.89 mg/dL — ABNORMAL HIGH (ref 0.61–1.24)
GFR calc Af Amer: 40 mL/min — ABNORMAL LOW (ref >60)
GFR calc non Af Amer: 32 mL/min — ABNORMAL LOW (ref >60)
GFR, EST AFRICAN AMERICAN: 37 mL/min — AB (ref >60)
GFR, EST NON AFRICAN AMERICAN: 35 mL/min — AB (ref >60)
GLUCOSE: 255 mg/dL — AB (ref 65–99)
Glucose, Bld: 173 mg/dL — ABNORMAL HIGH (ref 65–99)
POTASSIUM: 3.8 mmol/L (ref 3.5–5.1)
POTASSIUM: 3.7 mmol/L (ref 3.5–5.1)
Phosphorus: 4 mg/dL (ref 2.5–4.6)
Phosphorus: 3.6 mg/dL (ref 2.5–4.6)
Sodium: 137 mmol/L (ref 135–145)
Sodium: 136 mmol/L (ref 135–145)

## 2017-02-04 LAB — BPAM PLATELET PHERESIS
Blood Product Expiration Date: 201809242359
ISSUE DATE / TIME: 201809231358
UNIT TYPE AND RH: 6200

## 2017-02-04 LAB — PREPARE PLATELET PHERESIS: Unit division: 0

## 2017-02-04 LAB — GLUCOSE, CAPILLARY
Glucose-Capillary: 155 mg/dL — ABNORMAL HIGH (ref 65–99)
Glucose-Capillary: 135 mg/dL — ABNORMAL HIGH (ref 65–99)
Glucose-Capillary: 240 mg/dL — ABNORMAL HIGH (ref 65–99)

## 2017-02-04 LAB — CBC
HEMATOCRIT: 21.2 % — AB (ref 39.0–52.0)
HEMOGLOBIN: 7.1 g/dL — AB (ref 13.0–17.0)
MCH: 35.5 pg — AB (ref 26.0–34.0)
MCHC: 33.5 g/dL (ref 30.0–36.0)
MCV: 106 fL — AB (ref 78.0–100.0)
Platelets: 46 10*3/uL — ABNORMAL LOW (ref 150–400)
RBC: 2 MIL/uL — AB (ref 4.22–5.81)
RDW: 18.6 % — ABNORMAL HIGH (ref 11.5–15.5)
WBC: 6.4 10*3/uL (ref 4.0–10.5)

## 2017-02-04 LAB — PROTIME-INR
INR: 1.17
Prothrombin Time: 14.8 seconds (ref 11.4–15.2)

## 2017-02-04 LAB — SURGICAL PCR SCREEN
MRSA, PCR: NEGATIVE
STAPHYLOCOCCUS AUREUS: POSITIVE — AB

## 2017-02-04 LAB — APTT: aPTT: 28 seconds (ref 24–36)

## 2017-02-04 SURGERY — INVASIVE LAB ABORTED CASE

## 2017-02-04 MED ORDER — HEPARIN (PORCINE) IN NACL 2-0.9 UNIT/ML-% IJ SOLN
INTRAMUSCULAR | Status: AC
  Filled 2017-02-04: qty 500

## 2017-02-04 MED ORDER — HYDROCODONE-ACETAMINOPHEN 5-325 MG PO TABS
1.0000 | ORAL_TABLET | Freq: Four times a day (QID) | ORAL | Status: DC | PRN
  Administered 2017-02-04: 1 via ORAL
  Filled 2017-02-04: qty 1

## 2017-02-04 MED ORDER — LIDOCAINE HCL (PF) 1 % IJ SOLN
INTRAMUSCULAR | Status: AC
  Filled 2017-02-04: qty 30

## 2017-02-04 MED ORDER — SODIUM CHLORIDE 0.9 % IV SOLN: INTRAVENOUS | Status: DC

## 2017-02-04 MED ORDER — CHLORHEXIDINE GLUCONATE 4 % EX LIQD: 60.0000 mL | Freq: Once | CUTANEOUS | Status: DC

## 2017-02-04 MED ORDER — LIDOCAINE HCL (PF) 1 % IJ SOLN
INTRAMUSCULAR | Status: AC
  Filled 2017-02-04: qty 60

## 2017-02-04 MED ORDER — CEFAZOLIN SODIUM-DEXTROSE 2-4 GM/100ML-% IV SOLN
INTRAVENOUS | Status: AC
  Filled 2017-02-04: qty 100

## 2017-02-04 MED ORDER — DOCUSATE SODIUM 50 MG/5ML PO LIQD
100.0000 mg | Freq: Two times a day (BID) | ORAL | Status: DC
  Administered 2017-02-05: 100 mg via ORAL
  Filled 2017-02-04 (×2): qty 10

## 2017-02-04 MED ORDER — SODIUM CHLORIDE 0.9 % IR SOLN
Status: AC
  Filled 2017-02-04: qty 2

## 2017-02-04 MED ORDER — FUROSEMIDE 10 MG/ML IJ SOLN: 60.0000 mg | Freq: Two times a day (BID) | INTRAMUSCULAR | Status: DC

## 2017-02-04 MED ORDER — CEFAZOLIN SODIUM-DEXTROSE 2-4 GM/100ML-% IV SOLN: 2.0000 g | INTRAVENOUS | Status: DC

## 2017-02-04 MED ORDER — SODIUM CHLORIDE 0.9 % IR SOLN: 80.0000 mg | Status: DC

## 2017-02-04 MED FILL — Medication: Qty: 1 | Status: AC

## 2017-02-04 SURGICAL SUPPLY — 2 items
CABLE SURGICAL S-101-97-12 (CABLE) IMPLANT
TRAY PACEMAKER INSERTION (PACKS) IMPLANT

## 2017-02-04 NOTE — Progress Notes (Signed)
Progress Note  Patient Name: Randall Reyes Date of Encounter: 02/04/2017  Primary Cardiologist: Jacinta Shoe  Subjective   Doing OK this AM. No CP, palpitations or SOB  Inpatient Medications    Scheduled Meds: . atovaquone  1,500 mg Oral Q breakfast  . Chlorhexidine Gluconate Cloth  6 each Topical Daily  . fludrocortisone  0.1 mg Oral Daily  . hydrocortisone sod succinate (SOLU-CORTEF) inj  50 mg Intravenous Q6H  . insulin aspart  0-20 Units Subcutaneous TID WC  . mouth rinse  15 mL Mouth Rinse BID  . pantoprazole (PROTONIX) IV  40 mg Intravenous QHS   Continuous Infusions: . sodium chloride 10 mL/hr at 01/31/17 2000  . sodium chloride 10 mL/hr at 02/01/17 0400  . sodium chloride 50 mL/hr at 02/04/17 0807  . sodium chloride    . DOPamine Stopped (02/04/17 0730)  . levETIRAcetam Stopped (02/03/17 2333)  . meropenem (MERREM) IV Stopped (02/03/17 2333)  . norepinephrine (LEVOPHED) Adult infusion Stopped (02/02/17 1600)   PRN Meds: Place/Maintain arterial line **AND** sodium chloride, sodium chloride flush   Vital Signs    Vitals:   02/04/17 0404 02/04/17 0500 02/04/17 0750 02/04/17 0800  BP:  (!) 101/52  (!) 103/52  Pulse:  61  (!) 56  Resp:  10  11  Temp: 98.3 F (36.8 C)  98.5 F (36.9 C)   TempSrc: Oral  Oral   SpO2:  93%  99%  Weight:  147 lb 11.3 oz (67 kg)    Height:        Intake/Output Summary (Last 24 hours) at 02/04/17 0854 Last data filed at 02/04/17 0800  Gross per 24 hour  Intake          1897.48 ml  Output             1510 ml  Net           387.48 ml   Filed Weights   02/02/17 0300 02/03/17 0500 02/04/17 0500  Weight: 144 lb 2.9 oz (65.4 kg) 148 lb 2.4 oz (67.2 kg) 147 lb 11.3 oz (67 kg)    Telemetry    SR 60's-70's, blocked APC's, Mobitz I has been observed as well - Personally Reviewed  ECG    No new EKGs - Personally Reviewed  Physical Exam   GEN: No acute distress.   Neck: supple Cardiac: RRR, 1/6 SM, rubs, or gallops.    Respiratory: diminished at the bases. GI: Soft, nontender  MS: No edema; R groin A-line Neuro:  Nonfocal  Psych: Normal affect   Labs    Chemistry  Recent Labs Lab 02/02/17 1705 02/03/17 0456 02/03/17 1816 02/04/17 0406  NA 136 136 138 137  K 4.0 4.1 3.7 3.8  CL 107 107 108 108  CO2 24 22 24 24   GLUCOSE 179* 173* 129* 173*  BUN 37* 40* 40* 39*  CREATININE 2.01* 1.98* 1.93* 1.78*  CALCIUM 7.7* 7.5* 7.8* 7.5*  ALBUMIN 2.7*  --  2.7* 2.5*  GFRNONAA 30* 30* 31* 35*  GFRAA 35* 35* 36* 40*  ANIONGAP 5 7 6 5      Hematology  Recent Labs Lab 02/02/17 2210 02/03/17 0456 02/04/17 0406  WBC 7.1 6.0 6.4  RBC 2.17* 2.10* 2.00*  HGB 7.5* 7.3* 7.1*  HCT 22.6* 22.0* 21.2*  MCV 104.1* 104.8* 106.0*  MCH 34.6* 34.8* 35.5*  MCHC 33.2 33.2 33.5  RDW 19.1* 18.7* 18.6*  PLT 27* 27* 46*    Cardiac Enzymes  Recent Labs Lab  01/28/17 2011  TROPONINI 0.03*   No results for input(s): TROPIPOC in the last 168 hours.   BNPNo results for input(s): BNP, PROBNP in the last 168 hours.   DDimer No results for input(s): DDIMER in the last 168 hours.   Radiology    No results found.  Cardiac Studies  01/29/17 TTE Study Conclusions - Left ventricle: The cavity size was normal. Systolic function was   normal. The estimated ejection fraction was 55%. Wall motion was   normal; there were no regional wall motion abnormalities. There   was an increased relative contribution of atrial contraction to   ventricular filling. Doppler parameters are consistent with   abnormal left ventricular relaxation (grade 1 diastolic   dysfunction). - Aortic valve: Trileaflet; normal thickness, mildly calcified   leaflets. There was mild regurgitation. - Mitral valve: There was mild regurgitation. - Pulmonic valve: There was mild regurgitation. - Inferior vena cava: The vessel was mildly dilated. - Pericardium, extracardiac: A possible, trivial, free-flowing   pericardial effusion was identified  at the apex. The fluid had no   internal echoes.  Patient Profile     81 y.o. male admitted with syncope/CPR due to prolonged heart block, hypotension, AMS, now better.   Assessment & Plan    1. Transient CHB with no escape      PPM is indicated.       Baseline conduction system disease      Telemetry is SR, 1st degree AVblock, I see blocked APCs, no bradycardic events      Persistent hypotension      Patient was just taken off dopamine this AM, BP is OK so far      ?pneumonia, ?tumor lysis syndrome  D/w patient and his wife PPM implant procedure, risks/benefits.  Particularly his increased infection risk/bleeingrisk.They would like to proceed I will review with Dr.Kalany Diekmann given Dopa just removed and A line remains, preferably would like central lines out 24hours    2. Hypotension      weaned off dopamine this AM  Will need A-line removed, kept 2/2 to pressor, Will d/w Dr. Caryl Comes device timing  3. CLL     Just had his 1st re-initiation of chemo day of admission     Hx of issues with chemo historically, prolonged pancytopenia, and tumor lysis syndrome that reuired hospitalization/and temp dialysis     plts 46      4. AI      mild on exam. Mild by echo.  5. Thrombocytopenia       S/p platelets last PM  6. ARF, hyperkalemia (6.0 on admit)     CRRT finished   7. Respiratory failure     Extubated 9/21     ? Pneumonia    Afebrile     WBC 6.4    For questions or updates, please contact [place] Please consult [email or web address] for contact info under Cardiology/STEMI.      Signed, Baldwin Jamaica, PA-C  02/04/2017, 8:54 AM  Patient ID: Randall Reyes, male   DOB: 06/03/1935, 81 y.o.   MRN: [id number]  The patient was seen interviewed and examined. Telemetry was reviewed extensively. There has been no interval high-grade heart block since 9/17. Heart block that was identified occurred in the context of hyperkalemia. As this is a potentially reversible issue, it is not  clear that pacing is indicated.  Pacing in him while feasible is associated with higher risks related to his CLL.  I reviewed with the family  and CCM. We will plan to observe on telemetry while in hospital. In the event that there are further episodes of heart block, we can proceed with pacing. If not, would suggest that we send him home with a loop recorder given his underlying bifascicular block  He is certainly struggled with consequences of chemotherapy with renal failure earlier this year and now hyperkalemia.

## 2017-02-04 NOTE — Progress Notes (Signed)
Physical Therapy Treatment Patient Details Name: Randall Reyes MRN: [id number] DOB: 23-Jul-1935 Today's Date: 02/04/2017    History of Present Illness Patient is a 81 y/o male who presents with syncopal episode at home requiring CPR and s/p cardiac arrest again at hospital. Intubated 9/18-9/20. CRRT 9/18-9/20. Admitted with tumor lysis syndrome. Possible HCAP - CXR 9/18 with RLL > LLL opacity. PMH includes CLL, HTN, DM,    PT Comments    Patient progressing well towards PT goals. Tolerated gait training with Min A for balance/safety pushing IV pole for support. Pt with dizziness due to first time being up which resolved. Supine BP 94/50; Sitting BP 106/53; Standing BP 112/58, sitting BP post activity 110/62. Anticipate, pt's mobility will improved with increased activity. Will continue to follow and progress as tolerated. May need RW for support pending progress.    Follow Up Recommendations  Home health PT;Supervision for mobility/OOB     Equipment Recommendations  Other (comment) (might need RW??)    Recommendations for Other Services       Precautions / Restrictions Precautions Precautions: Fall Precaution Comments: watch BP Restrictions Weight Bearing Restrictions: No    Mobility  Bed Mobility Overal bed mobility: Needs Assistance Bed Mobility: Rolling;Sidelying to Sit Rolling: Supervision Sidelying to sit: Supervision;HOB elevated       General bed mobility comments: Able to get to EOB without assist; increased time. Dizziness.  Transfers Overall transfer level: Needs assistance Equipment used: None Transfers: Sit to/from [facility] Sit to Stand: Min guard Stand pivot transfers: Min assist       General transfer comment: Min guard to steady in standing. Dizziness. See Assessment for BP details. Stood from [employer], from chair x1. Transferred to chair post ambulation. SPT bed to chair Min A  Ambulation/Gait Ambulation/Gait assistance: Min  assist Ambulation Distance (Feet): 50 Feet Assistive device:  (pushed IV pole) Gait Pattern/deviations: Step-through pattern;Decreased stride length;Step-to pattern;Wide base of support;Decreased step length - right;Decreased step length - left Gait velocity: decreased   General Gait Details: Holding onto IV pole for support; Min A for balance/safety. VSS throughout.    Stairs            Wheelchair Mobility    Modified Rankin (Stroke Patients Only)       Balance Overall balance assessment: Needs assistance Sitting-balance support: Feet supported;No upper extremity supported Sitting balance-Leahy Scale: Good Sitting balance - Comments: Able to perform there ex sitting EOB without difficulty.    Standing balance support: During functional activity Standing balance-Leahy Scale: Fair Standing balance comment: Able to stand statically without UE support but needs Min guard but requires min A for dynamic standing.                             Cognition Arousal/Alertness: Awake/alert Behavior During Therapy: WFL for tasks assessed/performed Overall Cognitive Status: Within Functional Limits for tasks assessed                                        Exercises      General Comments General comments (skin integrity, edema, etc.): Supine BP 94/50; Sitting BP 106/53; Standing BP 112/58, sitting BP post activity 110/62.      Pertinent Vitals/Pain Pain Assessment: No/denies pain    Home Living  Prior Function            PT Goals (current goals can now be found in the care plan section) Progress towards PT goals: Progressing toward goals    Frequency    Min 3X/week      PT Plan Current plan remains appropriate    Co-evaluation              AM-PAC PT "6 Clicks" Daily Activity  Outcome Measure  Difficulty turning over in bed (including adjusting bedclothes, sheets and blankets)?: None Difficulty  moving from lying on back to sitting on the side of the bed? : None Difficulty sitting down on and standing up from a chair with arms (e.g., wheelchair, bedside commode, etc,.)?: A Little Help needed moving to and from a bed to chair (including a wheelchair)?: A Little Help needed walking in hospital room?: A Little Help needed climbing 3-5 steps with a railing? : A Lot 6 Click Score: 19    End of Session Equipment Utilized During Treatment: Gait belt Activity Tolerance: Patient tolerated treatment well Patient left: in chair;with call bell/phone within reach;with nursing/sitter in room;with chair alarm set Nurse Communication: Mobility status PT Visit Diagnosis: Muscle weakness (generalized) (M62.81);Difficulty in walking, not elsewhere classified (R26.2)     Time: 7989-2119 PT Time Calculation (min) (ACUTE ONLY): 20 min  Charges:  $Gait Training: 8-22 mins                    G Codes:       Wray Kearns, PT, DPT [phone]     Lacie Draft 02/04/2017, 12:34 PM

## 2017-02-04 NOTE — Progress Notes (Signed)
[facility] / CRITICAL CARE MEDICINE   Name: Randall Reyes MRN: [id number] DOB: 08/12/1935    ADMISSION DATE:  01/28/2017 CONSULTATION DATE:  01/28/2017  REFERRING MD:  Dr. Dayna Barker   CHIEF COMPLAINT:  Syncope   BRIEF SUMMARY:  81 y/o M with PMH of Anemia, CLL (diagnosed 2006, treated with FCR in 2010 -- only received 3 cycles due to prolonged pancytopenia, 2015 started on Imbruica, Treated with 1 cycle BR 10/19/1243- 12/21/9831 complicated by severe tumor lysis syndrome (despite allopurinol and rasburicase) requiring hospitalization and dialysis), DM, HTN.   Presented to ED on 9/17 after syncopal episode at home requiring brief CPR, family reports that patient underwent chemotherapy treatment earlier in the day. Upon arrival to ED patient was noted to be alert, however during stay experienced another episode on unresponsiveness and was noted to be pulseless. After one round of CPR ROSC was achieved. WBC 149.9, K 6.0, Uric Acid <0.5. Patient transferred to [facility] for further management. PCCM asked to admit.     SUBJECTIVE: Cardiology reports they are going to hold pacemaker placement for now and observe on telemetry.  On 1L (not on home O2)  VITAL SIGNS: BP (!) 104/49   Pulse (!) 53   Temp 98.5 F (36.9 C) (Oral)   Resp 12   Ht 5\' 1"  (1.549 m)   Wt 147 lb 11.3 oz (67 kg)   SpO2 99%   BMI 27.91 kg/m   HEMODYNAMICS:    VENTILATOR SETTINGS:    INTAKE / OUTPUT: I/O last 3 completed shifts: In: 3429.9 [P.O.:900; I.V.:1948.9; Blood:266; IV Piggyback:315] Out: 1960 [ASNKN:[id number]]  PHYSICAL EXAMINATION: General:  Well developed elderly male in NAD HEENT: MM pink/moist, no jvd PSY: calm/appropriate Neuro: AAOx4, speech clear, MAE  CV: s1s2 rrr, no m/r/g PULM: even/non-labored, lungs bilaterally clear  BH:ALPF, non-tender, bsx4 active  Extremities: warm/dry, no edema  Skin: no rashes or lesions  LABS:  BMET  Recent Labs Lab 02/03/17 0456 02/03/17 1816  02/04/17 0406  NA 136 138 137  K 4.1 3.7 3.8  CL 107 108 108  CO2 22 24 24   BUN 40* 40* 39*  CREATININE 1.98* 1.93* 1.78*  GLUCOSE 173* 129* 173*    Electrolytes  Recent Labs Lab 01/31/17 0353  02/01/17 0400  02/02/17 0400 02/02/17 1705 02/03/17 0456 02/03/17 1816 02/04/17 0406  CALCIUM 7.3*  < > 7.3*  < > 7.4* 7.7* 7.5* 7.8* 7.5*  MG 2.4  --  2.3  --  2.3  --   --   --   --   PHOS 2.3*  < > 3.3  < > 3.5 3.8  --  4.0 4.0  < > = values in this interval not displayed.  CBC  Recent Labs Lab 02/02/17 2210 02/03/17 0456 02/04/17 0406  WBC 7.1 6.0 6.4  HGB 7.5* 7.3* 7.1*  HCT 22.6* 22.0* 21.2*  PLT 27* 27* 46*    Coag's  Recent Labs Lab 02/04/17 0406  APTT 28  INR 1.17    Sepsis Markers  Recent Labs Lab 01/29/17 0100 01/29/17 0428 01/29/17 1127  LATICACIDVEN 5.7* 4.7* 1.7    ABG  Recent Labs Lab 01/29/17 1113 01/29/17 1433 01/30/17 0429  PHART 7.439 7.444 7.476*  PCO2ART 39.2 40.2 36.2  PO2ART 166.0* 221.0* 173*    Liver Enzymes  Recent Labs Lab 02/02/17 1705 02/03/17 1816 02/04/17 0406  ALBUMIN 2.7* 2.7* 2.5*    Cardiac Enzymes  Recent Labs Lab 01/28/17 2011  TROPONINI 0.03*    Glucose  Recent Labs Lab 02/03/17 0001 02/03/17 0356 02/03/17 0833 02/03/17 1224 02/03/17 1650 February 17, 2017 0747  GLUCAP 180* 169* 169* 197* 158* 155*    Imaging No results found.   STUDIES:  CXR 9/17 >> No acute  CXR 9/18 >> RLL opacity > LLL EEG 9/18 >> diffuse changes ECHO 9/18 >> ef 55% g1dd  CULTURES: Sputum 9/18 >> nl flora BC 9/18 >> negative   ANTIBIOTICS: Vanc  9/18 > 9/21 Merrem 9/18 >> 2023-02-18  SIGNIFICANT EVENTS: 9/17  Admitted with syncopal episode / cardiac arrest, multiple codes February 18, 2023  Cardiology decided to hold pacemaker placement   LINES/TUBES: Right Chest Port >> L IJ CVL 9/17 > 9/22 L femoral A line 9/18 >> 2023-02-18  Et dc 9/20    DISCUSSION: 81 y/o M with CLL presents to ED on 9/18 with syncopal event /  cardiac arrest. K 6.0, WBC 149.9. Concern for possible tumor lysis. CRRT started 9/18 with vast improvement. O2 needs minimal. Extubated 9/20. 9/21 still on pressors, hgb 6.7. 9/22 CHB on dopa and off levo, was planned for [facility] 02/18/2023 but Cardiology re-evaluated and decided to hold for now.  ASSESSMENT / PLAN:  PULMONARY A: Acute Hypoxic Respiratory Failure - in setting of cardiac arrest, CAP/immune suppression, s/p intubation 9/18 Resp alkalosis  HCAP - CXR 9/18 with RLL > LLL opacity P:   Intermittent CXR Pulmonary hygiene - IS, mobilize Follow cultures  Discontinue abx after February 18, 2023 dosing   CARDIOVASCULAR A:  Hypotension - shock? 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  At risk MDR given chemo / immunocompromised host P:   Follow cultures  Complete abx after 18-Feb-2023 dosing  Monitor fever curve / WBC trend  ENDOCRINE A:   DM   P:   SSI   NEUROLOGIC A:   Acute Encephalopathy - remains unresponsive AM 9/18 despite no sedation Questionable Seizure Activity - 9/18 and started on keppra P:    RASS goal: n/a Continue Keppra, discontinue in 2 weeks    FAMILY  - Updates:  Patient updated on plan of care.    - Inter-disciplinary family meet or Palliative Care meeting due by: 02/05/2017   - Global: transfer to tele, [place] service as of 9/25 am   Noe Gens, NP-C [facility] Pgr: [phone] or if no answer [phone] February 17, 2017, 10:20 AM   STAFF NOTE: I, Merrie Roof, MD FACP have personally reviewed patient's available data, including medical history, events of note, physical examination and test results as part of my evaluation. I have discussed with resident/NP and  other care providers such as pharmacist, RN and RRT. In addition, I personally evaluated patient and elicited key findings of: awake, alert, no distress, CTA, abdo soft, HD cath removed, no bleeding at site, EP nmow deciding with his improvement sof pressors to hold off on pacer, keep tele, consider lasxi soon, follow chem closely, fortunately doe snot appear overloaded, consider MAP goal 60, diet advance, complete meropenem course today, maintain florinef and stress roids with borderline BP, in am likley dc florinefa nd redeuce steroids, to tele The patient is critically ill with multiple organ systems failure and requires high complexity decision making for assessment and support, frequent evaluation and titration of therapies, application of advanced monitoring technologies and extensive interpretation of multiple databases. Critical Care Time devoted to patient care services described in this note is30 Minutes. This time reflects time of care of this signee: Merrie Roof, MD FACP. This critical care time does not reflect procedure time, or teaching time or supervisory time of PA/NP/Med student/Med Resident etc but could involve care discussion time. Rest per NP/medical resident whose note is outlined above and that I agree with   Lavon Paganini. Titus Mould, MD, [place] Pgr: [place]  [facility] 02/04/2017 11:21 AM

## 2017-02-05 ENCOUNTER — Other Ambulatory Visit: Payer: Self-pay | Admitting: Physician Assistant

## 2017-02-05 ENCOUNTER — Inpatient Hospital Stay (HOSPITAL_COMMUNITY): Payer: Medicare Other

## 2017-02-05 DIAGNOSIS — R55 Syncope and collapse: Secondary | ICD-10-CM

## 2017-02-05 DIAGNOSIS — I4892 Unspecified atrial flutter: Secondary | ICD-10-CM

## 2017-02-05 LAB — RENAL FUNCTION PANEL
ALBUMIN: 2.4 g/dL — AB (ref 3.5–5.0)
ALBUMIN: 2.7 g/dL — AB (ref 3.5–5.0)
Anion gap: 5 (ref 5–15)
Anion gap: 5 (ref 5–15)
BUN: 41 mg/dL — AB (ref 6–20)
BUN: 40 mg/dL — AB (ref 6–20)
CALCIUM: 7.7 mg/dL — AB (ref 8.9–10.3)
CHLORIDE: 108 mmol/L (ref 101–111)
CO2: 23 mmol/L (ref 22–32)
CO2: 23 mmol/L (ref 22–32)
CREATININE: 1.72 mg/dL — AB (ref 0.61–1.24)
Calcium: 7.5 mg/dL — ABNORMAL LOW (ref 8.9–10.3)
Chloride: 109 mmol/L (ref 101–111)
Creatinine, Ser: 1.74 mg/dL — ABNORMAL HIGH (ref 0.61–1.24)
GFR calc Af Amer: 41 mL/min — ABNORMAL LOW (ref >60)
GFR calc non Af Amer: 36 mL/min — ABNORMAL LOW (ref >60)
GFR, EST AFRICAN AMERICAN: 42 mL/min — AB (ref >60)
GFR, EST NON AFRICAN AMERICAN: 36 mL/min — AB (ref >60)
GLUCOSE: 178 mg/dL — AB (ref 65–99)
Glucose, Bld: 157 mg/dL — ABNORMAL HIGH (ref 65–99)
PHOSPHORUS: 3.9 mg/dL (ref 2.5–4.6)
PHOSPHORUS: 3.4 mg/dL (ref 2.5–4.6)
POTASSIUM: 3.7 mmol/L (ref 3.5–5.1)
Potassium: 3.4 mmol/L — ABNORMAL LOW (ref 3.5–5.1)
SODIUM: 136 mmol/L (ref 135–145)
Sodium: 137 mmol/L (ref 135–145)

## 2017-02-05 LAB — GLUCOSE, CAPILLARY
GLUCOSE-CAPILLARY: 181 mg/dL — AB (ref 65–99)
GLUCOSE-CAPILLARY: 140 mg/dL — AB (ref 65–99)
Glucose-Capillary: 179 mg/dL — ABNORMAL HIGH (ref 65–99)

## 2017-02-05 MED ORDER — MUPIROCIN 2 % EX OINT
1.0000 "application " | TOPICAL_OINTMENT | Freq: Two times a day (BID) | CUTANEOUS | Status: DC
  Administered 2017-02-05 – 2017-02-08 (×7): 1 via NASAL
  Filled 2017-02-05 (×2): qty 22

## 2017-02-05 MED ORDER — CHLORHEXIDINE GLUCONATE CLOTH 2 % EX PADS
6.0000 | MEDICATED_PAD | Freq: Every day | CUTANEOUS | Status: DC
  Administered 2017-02-05 – 2017-02-08 (×4): 6 via TOPICAL

## 2017-02-05 MED ORDER — LEVETIRACETAM 100 MG/ML PO SOLN
750.0000 mg | Freq: Two times a day (BID) | ORAL | Status: DC
  Administered 2017-02-05 – 2017-02-08 (×7): 750 mg via ORAL
  Filled 2017-02-05 (×8): qty 7.5

## 2017-02-05 MED ORDER — DOCUSATE SODIUM 100 MG PO CAPS
100.0000 mg | ORAL_CAPSULE | Freq: Two times a day (BID) | ORAL | Status: DC
  Administered 2017-02-05 – 2017-02-07 (×5): 100 mg via ORAL
  Filled 2017-02-05 (×5): qty 1

## 2017-02-05 NOTE — Progress Notes (Signed)
Physical Therapy Treatment Patient Details Name: Randall Reyes MRN: [id number] DOB: February 12, 1936 Today's Date: 02/05/2017    History of Present Illness Patient is a 81 y/o male who presents with syncopal episode at home requiring CPR and s/p cardiac arrest again at hospital. Intubated 9/18-9/20. CRRT 9/18-9/20. Admitted with tumor lysis syndrome. Possible HCAP - CXR 9/18 with RLL > LLL opacity. PMH includes CLL, HTN, DM,    PT Comments    Patient progressing well towards PT goals. Improved ambulation distance today using RW but limited due to drop in Sp02 with mobility. Sp02 dropped to low 80s on RA and required forced standing rest breaks and cues for pursed lip breathing to recover Sp02. Pt asymptomatic throughout. Will continue to follow and progress as tolerated. May need 02 for mobility.    Follow Up Recommendations  Home health PT;Supervision for mobility/OOB     Equipment Recommendations  Rolling walker with 5" wheels (if he doesnt have one)    Recommendations for Other Services       Precautions / Restrictions Precautions Precautions: Fall Precaution Comments: watch BP Restrictions Weight Bearing Restrictions: No    Mobility  Bed Mobility Overal bed mobility: Needs Assistance Bed Mobility: Rolling;Sidelying to Sit Rolling: Modified independent (Device/Increase time) Sidelying to sit: Modified independent (Device/Increase time);HOB elevated       General bed mobility comments: Able to get to EOB without assist; increased time.   Transfers Overall transfer level: Needs assistance Equipment used: Rolling walker (2 wheeled) Transfers: Sit to/from Stand Sit to Stand: Supervision         General transfer comment: Supervision for safety. Stood from [employer], transferred to chair post ambulation.   Ambulation/Gait Ambulation/Gait assistance: Min assist Ambulation Distance (Feet): 150 Feet Assistive device: Rolling walker (2 wheeled) Gait Pattern/deviations:  Step-through pattern;Decreased stride length;Step-to pattern;Wide base of support;Decreased step length - right;Decreased step length - left Gait velocity: decreased   General Gait Details: Slow, mildly unsteady gait with RW for support; cues for RW proxmity. 2 forced standing rest breaks due to drop in Sp02 to low 80s on RA. Cues for pursed lip breathing.    Stairs            Wheelchair Mobility    Modified Rankin (Stroke Patients Only)       Balance                                            Cognition Arousal/Alertness: Awake/alert Behavior During Therapy: WFL for tasks assessed/performed Overall Cognitive Status: Within Functional Limits for tasks assessed                                        Exercises      General Comments General comments (skin integrity, edema, etc.): VSS throughout except drop inS p02 with mobility.       Pertinent Vitals/Pain Pain Assessment: No/denies pain    Home Living                      Prior Function            PT Goals (current goals can now be found in the care plan section) Progress towards PT goals: Progressing toward goals    Frequency    Min 3X/week  PT Plan Current plan remains appropriate    Co-evaluation              AM-PAC PT "6 Clicks" Daily Activity  Outcome Measure  Difficulty turning over in bed (including adjusting bedclothes, sheets and blankets)?: None Difficulty moving from lying on back to sitting on the side of the bed? : None Difficulty sitting down on and standing up from a chair with arms (e.g., wheelchair, bedside commode, etc,.)?: None Help needed moving to and from a bed to chair (including a wheelchair)?: A Little Help needed walking in hospital room?: A Little Help needed climbing 3-5 steps with a railing? : A Little 6 Click Score: 21    End of Session Equipment Utilized During Treatment: Gait belt Activity Tolerance: Treatment  limited secondary to medical complications (Comment) (drop in SP02) Patient left: in chair;with call bell/phone within reach;with chair alarm set;with family/visitor present Nurse Communication: Mobility status;Other (comment) (SP02) PT Visit Diagnosis: Muscle weakness (generalized) (M62.81);Difficulty in walking, not elsewhere classified (R26.2)     Time: 4585-9292 PT Time Calculation (min) (ACUTE ONLY): 21 min  Charges:  $Gait Training: 8-22 mins                    G Codes:       Wray Kearns, PT, DPT [phone]     [place] 02/05/2017, 10:13 AM

## 2017-02-05 NOTE — Progress Notes (Signed)
I have spoken with Dr. Alvy Bimler of oncology for an opinion regarding the patient's primary disease process CLLin addition to disposition issues with regards to anticoagulation and we appreciate her input greatly in advance She was see the patient on 9/26  Verneita Griffes, MD [facility] (P) [phone]

## 2017-02-05 NOTE — Progress Notes (Signed)
Pt is having more frequent pauses. Longest pause was 5.42 secs. Pt is asymptomatic. Vitals are stable. On call Cardiologist notified. No changes in treatment at this time. Will continue to monitor.

## 2017-02-05 NOTE — Progress Notes (Signed)
Inpatient Diabetes Program Recommendations  AACE/ADA: New Consensus Statement on Inpatient Glycemic Control (2015)  Target Ranges:  Prepandial:   less than 140 mg/dL      Peak postprandial:   less than 180 mg/dL (1-2 hours)      Critically ill patients:  140 - 180 mg/dL   Lab Results  Component Value Date   GLUCAP 181 (H) 02/05/2017   HGBA1C 6.5 (H) 05/24/2016    Review of Glycemic ControlResults for TRAVANTE, KNEE (MRN [id number]) as of 02/05/2017 13:14  Ref. Range 02/03/2017 16:50 02/04/2017 07:47 02/04/2017 12:06 02/04/2017 15:54 02/05/2017 13:07  Glucose-Capillary Latest Ref Range: 65 - 99 mg/dL 158 (H) 155 (H) 135 (H) 240 (H) 181 (H)   Diabetes history: Diabetes Mellitus Outpatient Diabetes medications: Januvia 100 mg daily Current orders for Inpatient glycemic control:  Novolog resistant tid with meals, Solucortef 50 mg IV q 6 hours  Inpatient Diabetes Program Recommendations:   May consider adding Lantus 10 units daily while in the hospital and on steroids.  Thanks, Adah Perl, RN, BC-ADM Inpatient Diabetes Coordinator Pager [phone] (8a-5p)

## 2017-02-05 NOTE — Progress Notes (Signed)
PROGRESS NOTE    Randall Reyes  ZDG:[id number] DOB: 1935-09-27 DOA: 01/28/2017 PCP: [name], MD   Specialists:    Brief Narrative:  81 year old male CLL diagnosis 9518  Complicated by pancytopenia  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 Diabetes mellitus Hypertension  Presented to ED 9/7 syncopal needed CPR and had another episode of pulseless arrest Admitted by critical care Cardiology consulted    Assessment & Plan:   Active Problems:   Syncope   Tumor lysis syndrome   Cardiopulmonary arrest ([place])   Acute respiratory failure (HCC)   Metabolic acidosis   Encounter for central line placement   Encounter for imaging study to confirm orogastric (OG) tube placement   Syncope/complete heart block status post CPR X2 ? Atrial flutter CHad2Vasc2 score=4 Tachybradycardia syndrome complicated by hypotension ? CHF-CXR 9/25 suggestive of fluid  Stabilizing-not a candidate for pacemaker per EP  Hesitant to diurese today, plus 1.2 L so far from possible to admission  Needs outpatient EP follow-up and event monitor to be arranged by them  Hypotension  Not on any antihypertensives, continue Florinef  0.1 daily  Weaning stress dose steroids in a.m. 9/26 to 50 every 8   Diabetes mellitus type 2  Continue Januvia 100 on discharge  For now use sliding scale coverage--expect sugars well get better when stress dose steroids are discontinue  CLL complicated by thrombocytopenia, tumor lysis syndrome  Oncologist is at [facility] [employer] tried to contact  We will not be starting any anticoagulation until she is seen there at her office  I have consulted oncology here at [facility] long to weigh in  [place] conversation today with wife who asks about next steps and potential decline as well as intolerance of chemotherapy and seems to understand the philosophy of hospice  I will follow up again with these discussions once oncology has a chance to weigh in   No  evidence of pneumonia on x-ray today   [place]  does meet the criteria for a Palliative Care Consult due to: Needs goals of care discussion - Complex decision making and Advanced or end stage organ disease .   Full code Inpatient Unclear at this time disposition  Consultants:   cardiology  Procedures:   none  Antimicrobials:   Prophylaxis meds given CLL    Subjective:   Objective: Vitals:   02/04/17 2036 02/04/17 2100 02/04/17 2213 02/05/17 0515  BP:  94/63 96/67 (!) 87/58  Pulse:  74 69 69  Resp:  13 17 11   Temp: 98 F (36.7 C)  98.3 F (36.8 C) 97.6 F (36.4 C)  TempSrc: Oral  Oral Oral  SpO2:  100% 98% 97%  Weight:   68.5 kg (151 lb)   Height:   5\' 2"  (1.575 m)     Intake/Output Summary (Last 24 hours) at 02/05/17 1348 Last data filed at 02/05/17 1054  Gross per 24 hour  Intake            377.5 ml  Output              805 ml  Net           -427.5 ml   Filed Weights   02/03/17 0500 02/04/17 0500 02/04/17 2213  Weight: 67.2 kg (148 lb 2.4 oz) 67 kg (147 lb 11.3 oz) 68.5 kg (151 lb)    Examination:    Data Reviewed: I have personally reviewed following labs and imaging studies  CBC:  Recent  Labs Lab 02/01/17 1305 02/02/17 0400 02/02/17 2210 02/03/17 0456 02/04/17 0406  WBC 6.4 5.3 7.1 6.0 6.4  NEUTROABS  --   --  3.3  --   --   HGB 8.1* 7.6* 7.5* 7.3* 7.1*  HCT 24.3* 22.8* 22.6* 22.0* 21.2*  MCV 105.2* 104.6* 104.1* 104.8* 106.0*  PLT 24* 24* 27* 27* 46*   Basic Metabolic Panel:  Recent Labs Lab 01/30/17 0600  01/31/17 0353  02/01/17 0400  02/02/17 0400 02/02/17 1705 02/03/17 0456 02/03/17 1816 02/04/17 0406 02/04/17 1546 02/05/17 0450  NA 137  < > 135  < > 136  < > 135 136 136 138 137 136 137  K 3.9  < > 4.0  < > 3.7  < > 4.2 4.0 4.1 3.7 3.8 3.7 3.7  CL 104  < > 104  < > 104  < > 107 107 107 108 108 107 109  CO2 25  < > 25  < > 25  < > 22 24 22 24 24  21* 23  GLUCOSE 115*  < > 187*  < > 127*  < > 113* 179* 173*  129* 173* 255* 178*  BUN 19  < > 22*  < > 32*  < > 35* 37* 40* 40* 39* 40* 41*  CREATININE 1.24  < > 0.91  < > 1.81*  < > 1.98* 2.01* 1.98* 1.93* 1.78* 1.89* 1.74*  CALCIUM 7.4*  < > 7.3*  < > 7.3*  < > 7.4* 7.7* 7.5* 7.8* 7.5* 7.6* 7.5*  MG 2.3  --  2.4  --  2.3  --  2.3  --   --   --   --   --   --   PHOS 5.1*  < > 2.3*  < > 3.3  < > 3.5 3.8  --  4.0 4.0 3.6 3.9  < > = values in this interval not displayed. GFR: Estimated Creatinine Clearance: 29.3 mL/min (A) (by C-G formula based on SCr of 1.74 mg/dL (H)). Liver Function Tests:  Recent Labs Lab 02/02/17 1705 02/03/17 1816 02/04/17 0406 02/04/17 1546 02/05/17 0450  ALBUMIN 2.7* 2.7* 2.5* 2.7* 2.4*   No results for input(s): LIPASE, AMYLASE in the last 168 hours. No results for input(s): AMMONIA in the last 168 hours. Coagulation Profile:  Recent Labs Lab 02/04/17 0406  INR 1.17   Cardiac Enzymes: No results for input(s): CKTOTAL, CKMB, CKMBINDEX, TROPONINI in the last 168 hours. BNP (last 3 results) No results for input(s): PROBNP in the last 8760 hours. HbA1C: No results for input(s): HGBA1C in the last 72 hours. CBG:  Recent Labs Lab 02/03/17 1650 02/04/17 0747 02/04/17 1206 02/04/17 1554 02/05/17 1307  GLUCAP 158* 155* 135* 240* 181*   Lipid Profile: No results for input(s): CHOL, HDL, LDLCALC, TRIG, CHOLHDL, LDLDIRECT in the last 72 hours. Thyroid Function Tests: No results for input(s): TSH, T4TOTAL, FREET4, T3FREE, THYROIDAB in the last 72 hours. Anemia Panel: No results for input(s): VITAMINB12, FOLATE, FERRITIN, TIBC, IRON, RETICCTPCT in the last 72 hours. Urine analysis:    Component Value Date/Time   COLORURINE STRAW (A) 01/28/2017 1857   APPEARANCEUR CLEAR 01/28/2017 1857   LABSPEC 1.006 01/28/2017 1857   PHURINE 6.0 01/28/2017 1857   GLUCOSEU NEGATIVE 01/28/2017 1857   HGBUR NEGATIVE 01/28/2017 1857   BILIRUBINUR NEGATIVE 01/28/2017 1857   KETONESUR NEGATIVE 01/28/2017 1857   PROTEINUR 100  (A) 01/28/2017 1857   UROBILINOGEN 0.2 02/11/2015 1718   NITRITE NEGATIVE 01/28/2017 1857  LEUKOCYTESUR NEGATIVE 01/28/2017 1857     Radiology Studies: Reviewed images personally in health database    Scheduled Meds: . atovaquone  1,500 mg Oral Q breakfast  . Chlorhexidine Gluconate Cloth  6 each Topical Daily  . docusate sodium  100 mg Oral BID  . fludrocortisone  0.1 mg Oral Daily  . hydrocortisone sod succinate (SOLU-CORTEF) inj  50 mg Intravenous Q6H  . insulin aspart  0-20 Units Subcutaneous TID WC  . levETIRAcetam  750 mg Oral BID  . mupirocin ointment  1 application Nasal BID   Continuous Infusions: . sodium chloride 10 mL/hr at 02/01/17 0400  . sodium chloride 10 mL/hr at 02/04/17 1600     LOS: 8 days    Time spent: [place], MD [facility] ([facility]   If 7PM-7AM, please contact night-coverage [email or web address] Password [email or web address] 02/05/2017, 1:48 PM

## 2017-02-05 NOTE — Care Management Important Message (Signed)
Important Message  Patient Details  Name: Randall Reyes MRN: [id number] Date of Birth: 1935/06/12   Medicare Important Message Given:  Yes    [name] May 02/05/2017, 9:21 AM

## 2017-02-05 NOTE — Progress Notes (Signed)
Progress Note  Patient Name: CHRISTOHER DRUDGE Date of Encounter: 02/05/2017  Primary Cardiologist: Jacinta Shoe  Subjective   Doing OK again this AM. No CP, palpitations or SOB, out of ICU, reports was OOB yesterday  Inpatient Medications    Scheduled Meds: . atovaquone  1,500 mg Oral Q breakfast  . Chlorhexidine Gluconate Cloth  6 each Topical Daily  . docusate  100 mg Oral BID  . fludrocortisone  0.1 mg Oral Daily  . hydrocortisone sod succinate (SOLU-CORTEF) inj  50 mg Intravenous Q6H  . insulin aspart  0-20 Units Subcutaneous TID WC  . levETIRAcetam  750 mg Oral BID   Continuous Infusions: . sodium chloride 10 mL/hr at 02/01/17 0400  . sodium chloride 10 mL/hr at 02/04/17 1600   PRN Meds: HYDROcodone-acetaminophen, sodium chloride flush   Vital Signs    Vitals:   02/04/17 2036 02/04/17 2100 02/04/17 2213 02/05/17 0515  BP:  94/63 96/67 (!) 87/58  Pulse:  74 69 69  Resp:  13 17 11   Temp: 98 F (36.7 C)  98.3 F (36.8 C) 97.6 F (36.4 C)  TempSrc: Oral  Oral Oral  SpO2:  100% 98% 97%  Weight:   151 lb (68.5 kg)   Height:   5\' 2"  (1.575 m)     Intake/Output Summary (Last 24 hours) at 02/05/17 0914 Last data filed at 02/05/17 0911  Gross per 24 hour  Intake           521.29 ml  Output              955 ml  Net          -433.71 ml   Filed Weights   02/03/17 0500 02/04/17 0500 02/04/17 2213  Weight: 148 lb 2.4 oz (67.2 kg) 147 lb 11.3 oz (67 kg) 151 lb (68.5 kg)    Telemetry    AFlutter w/CVR 70's, short pause 2.8 seconds noted - Personally Reviewed  ECG    No new EKGs - Personally Reviewed  Physical Exam   GEN: No acute distress.   Neck: supple Cardiac: IRRR, 1/6 SM, rubs, or gallops.  Respiratory: remains diminished at the bases.Tubular sounds B GI: Soft, nontender  MS: No edema Neuro:  Nonfocal  Psych: Normal affect   Labs    Chemistry  Recent Labs Lab 02/04/17 0406 02/04/17 1546 02/05/17 0450  NA 137 136 137  K 3.8 3.7 3.7    CL 108 107 109  CO2 24 21* 23  GLUCOSE 173* 255* 178*  BUN 39* 40* 41*  CREATININE 1.78* 1.89* 1.74*  CALCIUM 7.5* 7.6* 7.5*  ALBUMIN 2.5* 2.7* 2.4*  GFRNONAA 35* 32* 36*  GFRAA 40* 37* 41*  ANIONGAP 5 8 5      Hematology  Recent Labs Lab 02/02/17 2210 02/03/17 0456 02/04/17 0406  WBC 7.1 6.0 6.4  RBC 2.17* 2.10* 2.00*  HGB 7.5* 7.3* 7.1*  HCT 22.6* 22.0* 21.2*  MCV 104.1* 104.8* 106.0*  MCH 34.6* 34.8* 35.5*  MCHC 33.2 33.2 33.5  RDW 19.1* 18.7* 18.6*  PLT 27* 27* 46*    Cardiac Enzymes No results for input(s): TROPONINI in the last 168 hours. No results for input(s): TROPIPOC in the last 168 hours.   BNPNo results for input(s): BNP, PROBNP in the last 168 hours.   DDimer No results for input(s): DDIMER in the last 168 hours.   Radiology    No results found.  Cardiac Studies  01/29/17 TTE Study Conclusions - Left ventricle: The  cavity size was normal. Systolic function was   normal. The estimated ejection fraction was 55%. Wall motion was   normal; there were no regional wall motion abnormalities. There   was an increased relative contribution of atrial contraction to   ventricular filling. Doppler parameters are consistent with   abnormal left ventricular relaxation (grade 1 diastolic   dysfunction). - Aortic valve: Trileaflet; normal thickness, mildly calcified   leaflets. There was mild regurgitation. - Mitral valve: There was mild regurgitation. - Pulmonic valve: There was mild regurgitation. - Inferior vena cava: The vessel was mildly dilated. - Pericardium, extracardiac: A possible, trivial, free-flowing   pericardial effusion was identified at the apex. The fluid had no   internal echoes.  Patient Profile     81 y.o. male admitted with syncope/CPR due to prolonged heart block, hypotension, AMS, now better.   Assessment & Plan    1. Transient CHB with no escape      Baseline conduction system disease      Telemetry is flutter since yesterday  afternoon, CVR, no significant brady events/pauses noted      Persistent intermittent hypotension, remains off pressors      ?pneumonia, ?tumor lysis syndrome  No plans at this time for pacing, suspect CHB episodes were provoked by hyperkalemia, none further has been observed, given his particular increased risk of infection/bleeding if able, would avoid pacing. Dr. Caryl Comes discussed with the patient out patient event monitoring and follow up if HR remains stable while here.  2. AFlutter, CVR     CHA2DS2Vasc is at least 4, given thrombocytopenia and CLL , not an a/c candidate  EP will follow from afar, further/ongoing care deferred to primary team.  Monitor and EP follow up has been arranged.  Please recall if needed.     3. Hypotension  4. 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 AI      mild on exam. Mild by echo. 6. Thrombocytopenia       No CBC today 7. ARF, hyperkalemia (6.0 on admit)     CRRT finished 8. Respiratory failure     Extubated 9/21     ? Pneumonia     Afebrile     CXR was ordered for today    For questions or updates, please contact [facility] Please consult [email or web address] for contact info under Cardiology/STEMI.      Signed, Baldwin Jamaica, PA-C  02/05/2017, 9:14 AM  Patient ID: [name], male   DOB: 1936/04/17, 81 y.o.   MRN: [id number]  Without bradycardia--suspect heart block 2/2 hyperkalemia and will follow as outpt with event recorder New Aflutter--would ask primary service to contact oncology/hematology re candidate or not for anticoagulation -- I would suspect not  BP low-- chronically not sure mechanism but may benefit from mitodrine or florinef if symptomatic   Chest exam very abnormal with tubular sounds  Will order CXR and ask primary service to follow   Will review  tele in am   Will follow tele

## 2017-02-05 NOTE — Progress Notes (Signed)
Nutrition Follow-up  DOCUMENTATION CODES:   Not applicable  INTERVENTION:    Continue CHO modified diet, current intake is adequate to meet nutrition needs  NUTRITION DIAGNOSIS:   Inadequate oral intake related to inability to eat as evidenced by NPO status.  Resolved  GOAL:   Patient will meet greater than or equal to 90% of their needs  Met with current intake of meals  MONITOR:   Vent status, TF tolerance, Labs, I & O's  ASSESSMENT:   81 yo male with PMH of anemia, CLL (received chemo treatment on day of admission), severe tumor lysis syndrome, DM, HTN, and CKD who was admitted on 9/17 after syncopal episode at home. He required CPR. Suspected to be in tumor lysis.  Patient was extubated on 9/20. Diet has been advanced to CHO modified. Patient is consuming 75-100% of meals, he is eating more than he does at home per wife. Labs and medications reviewed.  Diet Order:  Diet Carb Modified Fluid consistency: Thin; Room service appropriate? Yes  Skin:  Reviewed, no issues  Last BM:  9/23  Height:   Ht Readings from Last 1 Encounters:  02/04/17 _0  (1.575 m)    Weight:   Wt Readings from Last 1 Encounters:  02/04/17 151 lb (68.5 kg)    Ideal Body Weight:  50.9 kg  BMI:  Body mass index is 27.62 kg/m.  Estimated Nutritional Needs:   Kcal:  1515  Protein:  >/= 100 gm  Fluid:  1.5 L  EDUCATION NEEDS:   No education needs identified at this time  Molli Barrows, [place], [place], [place] Pager [phone] After Hours Pager [phone]

## 2017-02-06 ENCOUNTER — Other Ambulatory Visit (HOSPITAL_COMMUNITY): Payer: Medicare Other

## 2017-02-06 ENCOUNTER — Other Ambulatory Visit: Payer: Self-pay

## 2017-02-06 DIAGNOSIS — D61818 Other pancytopenia: Secondary | ICD-10-CM

## 2017-02-06 DIAGNOSIS — E440 Moderate protein-calorie malnutrition: Secondary | ICD-10-CM

## 2017-02-06 DIAGNOSIS — Z8674 Personal history of sudden cardiac arrest: Secondary | ICD-10-CM

## 2017-02-06 DIAGNOSIS — C9110 Chronic lymphocytic leukemia of B-cell type not having achieved remission: Secondary | ICD-10-CM

## 2017-02-06 DIAGNOSIS — I483 Typical atrial flutter: Secondary | ICD-10-CM

## 2017-02-06 DIAGNOSIS — N183 Chronic kidney disease, stage 3 (moderate): Secondary | ICD-10-CM

## 2017-02-06 DIAGNOSIS — I499 Cardiac arrhythmia, unspecified: Secondary | ICD-10-CM

## 2017-02-06 DIAGNOSIS — R001 Bradycardia, unspecified: Secondary | ICD-10-CM

## 2017-02-06 LAB — RENAL FUNCTION PANEL
ALBUMIN: 2.4 g/dL — AB (ref 3.5–5.0)
ALBUMIN: 2.5 g/dL — AB (ref 3.5–5.0)
ANION GAP: 3 — AB (ref 5–15)
Anion gap: 3 — ABNORMAL LOW (ref 5–15)
BUN: 39 mg/dL — AB (ref 6–20)
BUN: 38 mg/dL — ABNORMAL HIGH (ref 6–20)
CALCIUM: 7.7 mg/dL — AB (ref 8.9–10.3)
CO2: 26 mmol/L (ref 22–32)
CO2: 26 mmol/L (ref 22–32)
CREATININE: 1.63 mg/dL — AB (ref 0.61–1.24)
Calcium: 7.6 mg/dL — ABNORMAL LOW (ref 8.9–10.3)
Chloride: 109 mmol/L (ref 101–111)
Chloride: 110 mmol/L (ref 101–111)
Creatinine, Ser: 1.63 mg/dL — ABNORMAL HIGH (ref 0.61–1.24)
GFR calc non Af Amer: 38 mL/min — ABNORMAL LOW (ref >60)
GFR, EST AFRICAN AMERICAN: 45 mL/min — AB (ref >60)
GFR, EST AFRICAN AMERICAN: 45 mL/min — AB (ref >60)
GFR, EST NON AFRICAN AMERICAN: 38 mL/min — AB (ref >60)
GLUCOSE: 111 mg/dL — AB (ref 65–99)
Glucose, Bld: 165 mg/dL — ABNORMAL HIGH (ref 65–99)
PHOSPHORUS: 4 mg/dL (ref 2.5–4.6)
PHOSPHORUS: 4.2 mg/dL (ref 2.5–4.6)
POTASSIUM: 3.6 mmol/L (ref 3.5–5.1)
Potassium: 3.3 mmol/L — ABNORMAL LOW (ref 3.5–5.1)
SODIUM: 139 mmol/L (ref 135–145)
Sodium: 138 mmol/L (ref 135–145)

## 2017-02-06 LAB — CBC
HCT: 21.6 % — ABNORMAL LOW (ref 39.0–52.0)
Hemoglobin: 7.1 g/dL — ABNORMAL LOW (ref 13.0–17.0)
MCH: 35.3 pg — AB (ref 26.0–34.0)
MCHC: 32.9 g/dL (ref 30.0–36.0)
MCV: 107.5 fL — ABNORMAL HIGH (ref 78.0–100.0)
PLATELETS: 48 10*3/uL — AB (ref 150–400)
RBC: 2.01 MIL/uL — AB (ref 4.22–5.81)
RDW: 18.3 % — ABNORMAL HIGH (ref 11.5–15.5)
WBC: 8.2 10*3/uL (ref 4.0–10.5)

## 2017-02-06 LAB — GLUCOSE, CAPILLARY
GLUCOSE-CAPILLARY: 167 mg/dL — AB (ref 65–99)
GLUCOSE-CAPILLARY: 144 mg/dL — AB (ref 65–99)
Glucose-Capillary: 145 mg/dL — ABNORMAL HIGH (ref 65–99)
Glucose-Capillary: 130 mg/dL — ABNORMAL HIGH (ref 65–99)
Glucose-Capillary: 180 mg/dL — ABNORMAL HIGH (ref 65–99)

## 2017-02-06 LAB — TROPONIN I
Troponin I: <0.03 ng/mL (ref <0.03)
Troponin I: <0.03 ng/mL (ref <0.03)

## 2017-02-06 LAB — URIC ACID: URIC ACID, SERUM: 6.1 mg/dL (ref 4.4–7.6)

## 2017-02-06 MED ORDER — ATROPINE SULFATE 1 MG/10ML IJ SOSY
PREFILLED_SYRINGE | INTRAMUSCULAR | Status: AC
  Filled 2017-02-06: qty 10

## 2017-02-06 MED ORDER — SODIUM CHLORIDE 0.9% FLUSH
3.0000 mL | Freq: Two times a day (BID) | INTRAVENOUS | Status: DC
  Administered 2017-02-06 – 2017-02-07 (×2): 3 mL via INTRAVENOUS

## 2017-02-06 MED ORDER — CHLORHEXIDINE GLUCONATE 4 % EX LIQD: 60.0000 mL | Freq: Once | CUTANEOUS | Status: AC

## 2017-02-06 MED ORDER — SODIUM CHLORIDE 0.9 % IV SOLN
INTRAVENOUS | Status: DC
  Administered 2017-02-07: 05:00:00 via INTRAVENOUS

## 2017-02-06 MED ORDER — SODIUM CHLORIDE 0.9 % IR SOLN
80.0000 mg | Status: AC
  Administered 2017-02-07: 80 mg
  Filled 2017-02-06: qty 2

## 2017-02-06 MED ORDER — SODIUM CHLORIDE 0.9% FLUSH: 3.0000 mL | INTRAVENOUS | Status: DC | PRN

## 2017-02-06 MED ORDER — SODIUM CHLORIDE 0.9 % IV SOLN
250.0000 mL | INTRAVENOUS | Status: DC
  Administered 2017-02-07: 250 mL via INTRAVENOUS

## 2017-02-06 MED ORDER — CEFAZOLIN SODIUM-DEXTROSE 2-4 GM/100ML-% IV SOLN
2.0000 g | INTRAVENOUS | Status: AC
  Administered 2017-02-07: 2 g via INTRAVENOUS
  Filled 2017-02-06: qty 100

## 2017-02-06 MED ORDER — HYDROCORTISONE NA SUCCINATE PF 100 MG IJ SOLR
50.0000 mg | Freq: Two times a day (BID) | INTRAMUSCULAR | Status: DC
  Administered 2017-02-06 – 2017-02-08 (×4): 50 mg via INTRAVENOUS
  Filled 2017-02-06 (×3): qty 2

## 2017-02-06 MED ORDER — CHLORHEXIDINE GLUCONATE 4 % EX LIQD
60.0000 mL | Freq: Once | CUTANEOUS | Status: AC
  Administered 2017-02-07: 4 via TOPICAL
  Filled 2017-02-06: qty 60

## 2017-02-06 NOTE — Progress Notes (Signed)
Progress Note  Patient Name: Randall Reyes Date of Encounter: 02/06/2017  Primary Cardiologist: Jacinta Shoe  Subjective   DEveloped atrial flutter yesterday and started having long pauses  Without chest pain or SOB at this time   Inpatient Medications    Scheduled Meds: . atovaquone  1,500 mg Oral Q breakfast  . atropine      . Chlorhexidine Gluconate Cloth  6 each Topical Daily  . docusate sodium  100 mg Oral BID  . fludrocortisone  0.1 mg Oral Daily  . hydrocortisone sod succinate (SOLU-CORTEF) inj  50 mg Intravenous Q6H  . insulin aspart  0-20 Units Subcutaneous TID WC  . levETIRAcetam  750 mg Oral BID  . mupirocin ointment  1 application Nasal BID   Continuous Infusions: . sodium chloride 10 mL/hr at 02/01/17 0400  . sodium chloride Stopped (02/06/17 0500)   PRN Meds: HYDROcodone-acetaminophen, sodium chloride flush   Vital Signs    Vitals:   02/06/17 0400 02/06/17 0500 02/06/17 0600 02/06/17 0700  BP: 98/67 95/64 119/73   Pulse: 71 (!) 28 67   Resp: 17 13 16    Temp:    97.9 F (36.6 C)  TempSrc:    Oral  SpO2: 99% 100% 100%   Weight:      Height:        Intake/Output Summary (Last 24 hours) at 02/06/17 0821 Last data filed at 02/06/17 0600  Gross per 24 hour  Intake              500 ml  Output              825 ml  Net             -325 ml   Filed Weights   02/04/17 0500 02/04/17 2213 02/06/17 0304  Weight: 147 lb 11.3 oz (67 kg) 151 lb (68.5 kg) 156 lb 4.9 oz (70.9 kg)    Telemetry    AFlutter w/CVR 70's, short pause 2.8 seconds noted - Personally Reviewed  ECG    No new EKGs - Personally Reviewed  Chest Xray personally reviewed  With min effusion   Physical Exam  Well developed and cachectic wearing o2 HENT normal Neck supple   Carotids brisk and full without bruits Clear Slow and irregular rate and rhythm, no murmurs or gallops Abd-soft with active BS without hepatomegaly No Clubbing cyanosis tr edema Skin-warm and dry A &  Oriented  Grossly normal sensory and motor function   Labs    Chemistry  Recent Labs Lab 02/04/17 1546 02/05/17 0450 02/05/17 1535  NA 136 137 136  K 3.7 3.7 3.4*  CL 107 109 108  CO2 21* 23 23  GLUCOSE 255* 178* 157*  BUN 40* 41* 40*  CREATININE 1.89* 1.74* 1.72*  CALCIUM 7.6* 7.5* 7.7*  ALBUMIN 2.7* 2.4* 2.7*  GFRNONAA 32* 36* 36*  GFRAA 37* 41* 42*  ANIONGAP 8 5 5      Hematology  Recent Labs Lab 02/02/17 2210 02/03/17 0456 02/04/17 0406  WBC 7.1 6.0 6.4  RBC 2.17* 2.10* 2.00*  HGB 7.5* 7.3* 7.1*  HCT 22.6* 22.0* 21.2*  MCV 104.1* 104.8* 106.0*  MCH 34.6* 34.8* 35.5*  MCHC 33.2 33.2 33.5  RDW 19.1* 18.7* 18.6*  PLT 27* 27* 46*    Cardiac Enzymes No results for input(s): TROPONINI in the last 168 hours. No results for input(s): TROPIPOC in the last 168 hours.   BNPNo results for input(s): BNP, PROBNP in the last 168 hours.  DDimer No results for input(s): DDIMER in the last 168 hours.   Radiology    Dg Chest 2 View  Result Date: 02/05/2017 CLINICAL DATA:  Pleuritic pain, diabetes EXAM: CHEST  2 VIEW COMPARISON:  Portable chest x-ray of 02/02/2017 FINDINGS: There are small bilateral pleural effusions present with mild basilar volume loss. There is moderate cardiomegaly noted with minimal congestion, consistent with very mild CHF. Right-sided Port-A-Cath tip overlies the lower SVC. No bony abnormality is seen. IMPRESSION: 1. Probable mild CHF with cardiomegaly, small effusions, and minimal congestion. 2. Port-A-Cath tip overlies the lower SVC. Electronically Signed   By: [name] M.D.   On: 02/05/2017 09:18    Cardiac Studies  01/29/17 TTE Study Conclusions - Left ventricle: The cavity size was normal. Systolic function was   normal. The estimated ejection fraction was 55%. Wall motion was   normal; there were no regional wall motion abnormalities. There   was an increased relative contribution of atrial contraction to   ventricular filling. Doppler  parameters are consistent with   abnormal left ventricular relaxation (grade 1 diastolic   dysfunction). - Aortic valve: Trileaflet; normal thickness, mildly calcified   leaflets. There was mild regurgitation. - Mitral valve: There was mild regurgitation. - Pulmonic valve: There was mild regurgitation. - Inferior vena cava: The vessel was mildly dilated. - Pericardium, extracardiac: A possible, trivial, free-flowing   pericardial effusion was identified at the apex. The fluid had no   internal echoes.  Patient Profile     81 y.o. male admitted with syncope/CPR due to prolonged heart block, hypotension, AMS, now better.   Assessment & Plan    1. Transient CHB with no escape in setting of hyperkalemia   2. AFlutter, CVR now with pauses > 5 sec 3. Hypotension  4. CLL 5 Thrombocytopenia 6.  Tumor lysis syndrome 7. Hypokalemia      With pauses-prolonged in atrial flutter and while awake will need to proceed with pacing  Spoke with Heme-Onc at [facility] recommended Tx 1 unit of plts prior to pacer insertion  Need to check K   I then see that [place] saw the pt here--  Both have recommended AGAINST anticoagulation at this time   Will check CBC

## 2017-02-06 NOTE — Progress Notes (Signed)
[facility] CONSULT NOTE  Patient Care Team: [name], MD as PCP - General (Internal Medicine) Gala Romney, Cristopher Estimable, MD as Consulting Physician (Gastroenterology) Fran Lowes, MD as Consulting Physician (Nephrology)  CHIEF COMPLAINTS/PURPOSE OF CONSULTATION:  Recurrent CLL, multiple organ failure due to infusion reaction to treatment  HISTORY OF PRESENTING ILLNESS:  Randall Reyes 81 y.o. male is seen at the request of the hospitalist to discuss prognosis in regards to CLL. This patient is treated at [facility] center The patient have trisomy 12 CLL requiring treatment The patient had poor tolerance to treatment based on oncologic history as outlined below:   CLL (chronic lymphocytic leukemia) ([place])   02/14/2005 Procedure    Peripheral flow cytometry- CLL (without 11q or 17 p deletion) presenting with stage 0 disease      10/25/2008 Progression    Increasing WBC and symptoms suggestive of progressive disease      11/09/2008 - 01/07/2009 Chemotherapy    Rituxan, fludarabine, cytoxan x 3 cycles with the discontinuation of fludarabine and cytoxan due to severe pancytopenia requiring hospitalization.      01/10/2009 Adverse Reaction    Severe pancytopenia and adverse side effects      02/01/2009 - 04/01/2009 Chemotherapy    Rituxan every 21 days x 3 cycles      04/02/2009 Remission    Stable disease.  Chemotherapy holiday started      06/22/2013 - 06/29/2013 Chemotherapy    Imbruvica daily      06/29/2013 Adverse Reaction    Large right posterior leg (popliteal) ecchymosis/violaceous rash.  Likely Imbruvica-induced      07/31/2013 - 10/12/2013 Chemotherapy    Zydelig (idelalisib) 150 mg BID monotherapy      10/12/2013 Adverse Reaction    Transaminitis, suspected to be secondary to [place].  Dose decreased.      10/12/2013 - 11/09/2013 Chemotherapy    Zydelig (idelalisib) 150 mg daily.      11/09/2013 Adverse Reaction    Worsening  transaminitis.  Zydelig on HOLD      11/09/2013 Treatment Plan Change    Hold Zydelig      12/28/2013 Adverse Reaction    Herpes Zoster outbreak.  Partial response with Famvir x 1 week, switched to acyclovir.  Progression over 1-2 weeks with development of keratoconjunctivitis bilaterally.  VZ Immune Globulin 625 mg IM given on 01/13/14.      02/08/2014 Treatment Plan Change    Ibrutinib restarted 140 mg daily for one week followed by 280 mg daily followed by 420 mg daily with prophylactic acyclovir.      02/08/2014 - 07/05/2014 Chemotherapy    Ibrutinib 420 mg daily      106/18/2015 - 04/17/2014 Hospital Admission    Right lower lobe pneumonia      07/05/2014 Adverse Reaction    Fissuring of fingertips and sore, red feet with transaminitis      07/05/2014 Treatment Plan Change    Hold Ibrutinib      07/22/2014 - 08/19/2014 Chemotherapy    Ibrutinib restarted at 280 mg daily.      08/09/2014 - 08/12/2014 Hospital Admission    Bilateral pneumonia      08/19/2014 Adverse Reaction    Nausea with vomiting.  Escorted to ED.      08/19/2014 Treatment Plan Change    Ibrutinib on hold- ED escort from clinic       10/18/2014 Miscellaneous    B12 weekly x 4 then monthly.  12/15/2015 Imaging    Bone density- BMD as determined from Femur Neck Left is 0.803 g/cm2 with a T-score of -2.1. This patient is considered osteopenic by World Healh Organization (WHO) Criteria.      12/16/2015 Imaging    CT abd/pelvis-  Progression of retroperitoneal and portacaval adenopathy compared to the prior study. Likely related to CLL.  Small hiatal hernia  Mild compression fractures T11 and T12, probably old. Correlate with any pain in this area.      05/22/2016 - 05/28/2016 Chemotherapy    BR day 1 and 2 x 1 cycle.  Bendamustine dose reduced by 50%.      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      Early this year, the patient developed significant tumor lysis syndrome requiring temporary hemodialysis that render him with chronic kidney disease. Treatment was discontinued because of significant infusion reaction. However, over the last 6 months, he started to have extreme leukocytosis with symptoms requiring treatment again. He received 1 dose of Obinutuzumab, complicated by cardiac arrest requiring resuscitation with CPR and aggressive cardiac support. Since his admission to the hospital, he had multiple bradycardia/significant arrhythmia and repeat CPR effort. The patient has been transferred from 6 E. to cardiac monitoring unit due to significant bradycardia/arrhythmia. At the time of interview, the patient appears somewhat somnolent but answers questions appropriately. He denies significant chest pain or shortness of breath.  He feels weak. He did not report recent fevers or night sweats.  No significant lymphadenopathy.  His appetite is poor and he thinks he might have lost some weight.  He denies recent nausea, vomiting or diarrhea.  MEDICAL HISTORY:  Past Medical History:  Diagnosis Date  . Anemia   . B12 deficiency 12/07/2014  . CLL (chronic lymphocytic leukemia) ([place]) 01/24/2011  . Diabetes mellitus   . DM (diabetes mellitus) ([place]) 01/24/2011  . Eczema 01/24/2011  . History of pneumonia 04/2014  . Hypertension   . Leukemia ([place]) 6.22.2012   PER PATIENT  . Melanoma in situ ([place]) 01/24/2011  . Mild obesity 01/24/2011  . Port catheter in place 05/02/2012  . Shingles   . Ulcer     SURGICAL HISTORY: Past Surgical History:  Procedure Laterality Date  . bleeding ulcer    . CATARACT EXTRACTION W/PHACO  05/05/2012   Procedure: CATARACT EXTRACTION PHACO AND INTRAOCULAR LENS PLACEMENT (IOC);  Surgeon: Tonny Branch, MD;  Location: [facility]S;  Service: Ophthalmology;   Laterality: Right;  CDE:13.25  . CATARACT EXTRACTION W/PHACO  05/15/2012   Procedure: CATARACT EXTRACTION PHACO AND INTRAOCULAR LENS PLACEMENT (IOC);  Surgeon: Tonny Branch, MD;  Location: [facility];  Service: Ophthalmology;  Laterality: Left;  CDE:  12.32  . COLONOSCOPY N/A 01/30/2016   Procedure: COLONOSCOPY;  Surgeon: Danie Binder, MD;  Location: [facility] SU[facility];  Service: Endoscopy;  Laterality: N/A;  2:00 PM - moved to 1:00 - office notified pt  . ESOPHAGOGASTRODUODENOSCOPY  2008   Dr. Oneida Alar: normal esophagus, antral erythema, 1 cm clean based duodenal ulcera, negative H. plyori  . ESOPHAGOGASTRODUODENOSCOPY N/A 01/30/2016   Procedure: ESOPHAGOGASTRODUODENOSCOPY (EGD);  Surgeon: Danie Binder, MD;  Location: [facility];  Service: Endoscopy;  Laterality: N/A;  . HERNIA REPAIR  2001  . PORTACATH PLACEMENT  2008    SOCIAL HISTORY: Social History   Social History  .  Marital status: Married    Spouse name: N/A  . Number of children: N/A  . Years of education: N/A   Occupational History  . Not on file.   Social History Main Topics  . Smoking status: Former Research scientist (life sciences)  . Smokeless tobacco: Former Systems developer    Quit date: 04/25/1967  . Alcohol use No  . Drug use: No  . Sexual activity: Not on file   Other Topics Concern  . Not on file   Social History Narrative  . No narrative on file    FAMILY HISTORY: Family History  Problem Relation Age of Onset  . Diabetes Father   . Colon cancer Neg Hx     ALLERGIES:  is allergic to aspirin and ibuprofen.  MEDICATIONS:  Current Facility-Administered Medications  Medication Dose Route Frequency Provider Last Rate Last Dose  . 0.9 %  sodium chloride infusion   Intravenous Continuous Raylene Miyamoto, MD 10 mL/hr at 02/01/17 0400    . 0.9 %  sodium chloride infusion   Intravenous Continuous Raylene Miyamoto, MD   Stopped at 02/06/17 0500  . atovaquone (MEPRON) 750 MG/5ML suspension 1,500 mg  1,500 mg Oral Q breakfast Harvel Quale,  RPH   1,500 mg at 02/05/17 1316  . atropine 1 MG/10ML injection           . atropine 1 MG/10ML injection           . Chlorhexidine Gluconate Cloth 2 % PADS 6 each  6 each Topical Daily Nita Sells, MD   6 each at 02/05/17 1200  . docusate sodium (COLACE) capsule 100 mg  100 mg Oral BID Rush Farmer, MD   100 mg at 02/05/17 2133  . fludrocortisone (FLORINEF) tablet 0.1 mg  0.1 mg Oral Daily Raylene Miyamoto, MD   0.1 mg at 02/05/17 1012  . HYDROcodone-acetaminophen (NORCO/VICODIN) 5-325 MG per tablet 1 tablet  1 tablet Oral Q6H PRN Neila Gear, NP   1 tablet at 02/04/17 2259  . hydrocortisone sodium succinate (SOLU-CORTEF) 100 MG injection 50 mg  50 mg Intravenous Q6H Raylene Miyamoto, MD   50 mg at 02/06/17 0539  . insulin aspart (novoLOG) injection 0-20 Units  0-20 Units Subcutaneous TID WC Rush Farmer, MD   3 Units at 02/05/17 1841  . levETIRAcetam (KEPPRA) 100 MG/ML solution 750 mg  750 mg Oral BID Kris Mouton, RPH   750 mg at 02/05/17 2134  . mupirocin ointment (BACTROBAN) 2 % 1 application  1 application Nasal BID Nita Sells, MD   1 application at 16/10/96 2135  . sodium chloride flush (NS) 0.9 % injection 10-40 mL  10-40 mL Intracatheter PRN Raylene Miyamoto, MD   10 mL at 02/05/17 1321   Facility-Administered Medications Ordered in Other Encounters  Medication Dose Route Frequency Provider Last Rate Last Dose  . heparin lock flush 100 unit/mL  500 Units Intravenous Once Holley Bouche, NP      . sodium chloride flush (NS) 0.9 % injection 10 mL  10 mL Intravenous PRN Holley Bouche, NP        REVIEW OF SYSTEMS:   Constitutional: Denies fevers, chills or abnormal night sweats Eyes: Denies blurriness of vision, double vision or watery eyes Ears, nose, mouth, throat, and face: Denies mucositis or sore throat Respiratory: Denies cough, dyspnea or wheezes Cardiovascular: Denies palpitation, chest discomfort or lower extremity  swelling Gastrointestinal:  Denies nausea, heartburn or change in bowel habits Skin: Denies  abnormal skin rashes Lymphatics: Denies new lymphadenopathy or easy bruising Neurological:Denies numbness, tingling  Behavioral/Psych: Mood is stable, no new changes  All other systems were reviewed with the patient and are negative.  PHYSICAL EXAMINATION: ECOG PERFORMANCE STATUS: 3 - Symptomatic, >50% confined to bed  Vitals:   02/06/17 0500 02/06/17 0600  BP: 95/64 119/73  Pulse: (!) 28 67  Resp: 13 16  Temp:    SpO2: 100% 100%   Filed Weights   02/04/17 0500 02/04/17 2213 02/06/17 0304  Weight: 147 lb 11.3 oz (67 kg) 151 lb (68.5 kg) 156 lb 4.9 oz (70.9 kg)    GENERAL:alert, no distress and comfortable.  He looks frail and elderly. SKIN: Noted significant skin bruises.  He has skin lesion over the left forehead EYES: normal, conjunctiva are pink and non-injected, sclera clear OROPHARYNX:no exudate, no erythema and lips, buccal mucosa, and tongue normal  NECK: supple, thyroid normal size, non-tender, without nodularity LYMPH:  no palpable lymphadenopathy in the cervical, axillary or inguinal LUNGS: clear to auscultation and percussion with normal breathing effort HEART: Significant irregular heart rate.   ABDOMEN:abdomen soft, non-tender and normal bowel sounds Musculoskeletal:no cyanosis of digits and no clubbing  PSYCH: alert & oriented x 3 with fluent speech NEURO: no focal motor/sensory deficits  LABORATORY DATA:  I have reviewed the data as listed Lab Results  Component Value Date   WBC 6.4 02/04/2017   HGB 7.1 (L) 02/04/2017   HCT 21.2 (L) 02/04/2017   MCV 106.0 (H) 02/04/2017   PLT 46 (L) 02/04/2017    Recent Labs  10/31/16 1419 01/18/17 0924 01/28/17 0801  02/04/17 1546 02/05/17 0450 02/05/17 1535  NA 137 139 137  < > 136 137 136  K 4.5 5.2* 4.8  < > 3.7 3.7 3.4*  CL 106 107 102  < > 107 109 108  CO2 24 25 26   < > 21* 23 23  GLUCOSE 97 120* 154*  < > 255*  178* 157*  BUN 25* 24* 31*  < > 40* 41* 40*  CREATININE 2.02* 2.10* 2.66*  < > 1.89* 1.74* 1.72*  CALCIUM 8.5* 8.7* 8.6*  < > 7.6* 7.5* 7.7*  GFRNONAA 30* 28* 21*  < > 32* 36* 36*  GFRAA 34* 33* 25*  < > 37* 41* 42*  PROT 6.6 7.0 7.0  --   --   --   --   ALBUMIN 3.8 3.9 3.8  < > 2.7* 2.4* 2.7*  AST 18 20 21   --   --   --   --   ALT 13* 13* 12*  --   --   --   --   ALKPHOS 77 106 112  --   --   --   --   BILITOT 0.4 0.5 0.5  --   --   --   --   < > = values in this interval not displayed.  RADIOGRAPHIC STUDIES: I have personally reviewed the radiological images as listed and agreed with the findings in the report. Dg Chest 2 View  Result Date: 02/05/2017 CLINICAL DATA:  Pleuritic pain, diabetes EXAM: CHEST  2 VIEW COMPARISON:  Portable chest x-ray of 02/02/2017 FINDINGS: There are small bilateral pleural effusions present with mild basilar volume loss. There is moderate cardiomegaly noted with minimal congestion, consistent with very mild CHF. Right-sided Port-A-Cath tip overlies the lower SVC. No bony abnormality is seen. IMPRESSION: 1. Probable mild CHF with cardiomegaly, small effusions, and minimal congestion. 2. Port-A-Cath tip  overlies the lower SVC. Electronically Signed   By: [name] M.D.   On: 02/05/2017 09:18   Dg Chest Port 1 View  Result Date: 02/02/2017 CLINICAL DATA:  Respiratory failure EXAM: PORTABLE CHEST 1 VIEW COMPARISON:  02/01/2017 FINDINGS: Right chest wall port and left jugular temporary dialysis catheter are again seen and stable. Aortic calcifications are seen. Cardiomegaly is again noted. The lungs are well aerated with some degree of vascular congestion. Stable basilar changes are noted bilaterally. IMPRESSION: No significant interval change from the prior exam. Electronically Signed   By: Inez Catalina M.D.   On: 02/02/2017 07:04   Dg Chest Port 1 View  Result Date: 02/01/2017 CLINICAL DATA:  Respiratory failure . EXAM: PORTABLE CHEST 1 VIEW COMPARISON:   01/30/2017. FINDINGS: Interim extubation and removal of NG tube. Bilateral IJ lines in stable position. Stable cardiomegaly . Bilateral lower lobe pulmonary infiltrates with basilar atelectasis again noted. No interim change. No pleural effusion or pneumothorax . IMPRESSION: 1. Interim extubation removal of NG tube. Bilateral IJ lines in stable position. 2. Bilateral lower lobe pulmonary infiltrates and atelectasis again noted. No interim change. 3. Stable cardiomegaly. Electronically Signed   By: Marcello Moores  Register   On: 02/01/2017 06:24   Dg Chest Port 1 View  Result Date: 01/30/2017 CLINICAL DATA:  Respiratory failure EXAM: PORTABLE CHEST 1 VIEW COMPARISON:  01/29/2017 FINDINGS: Support devices are stable. Bilateral perihilar and lower lobe opacities are unchanged. Node visible effusions. Heart is borderline in size. IMPRESSION: Bilateral lower lobe airspace opacities, unchanged. Electronically Signed   By: Rolm Baptise M.D.   On: 01/30/2017 08:28   Dg Chest Port 1 View  Result Date: 01/29/2017 CLINICAL DATA:  Orogastric tube placement and central line placement EXAM: PORTABLE CHEST 1 VIEW COMPARISON:  01/28/2017 FINDINGS: Endotracheal tube has been inserted and the tip is about 4.7 cm superior to the carina. Right-sided central venous catheter tip overlies the SVC. Left jugular venous catheter tips overlie a the brachiocephalic confluence. No pneumothorax is seen. Borderline cardiomegaly. Development of patchy right greater than left lower lung opacity. IMPRESSION: 1. Endotracheal tube tip about 4.7 cm superior to carina. Left-sided central venous catheter tips overlie the brachiocephalic confluence. No pneumothorax 2. Interim development of right greater than left lower lung airspace disease which may reflect pneumonia or asymmetric edema. Electronically Signed   By: Donavan Foil M.D.   On: 01/29/2017 01:25   Dg Chest Port 1 View  Result Date: 01/28/2017 CLINICAL DATA:  Dyspnea, pt has external pacer  pad and c-pap mask EXAM: PORTABLE CHEST 1 VIEW COMPARISON:  Chest x-ray dated 08/24/2014. FINDINGS: Heart size and mediastinal contours are within normal limits given patient's oblique positioning. Pacer pad overlies the heart. Lungs are clear. No pleural effusion or pneumothorax seen. No acute or suspicious osseous finding. IMPRESSION: No active disease.  No evidence of pneumonia or pulmonary edema. Electronically Signed   By: Franki Cabot M.D.   On: 01/28/2017 20:02    ASSESSMENT & PLAN:  Recurrent CLL The patient had near complete response to only small dose of Obinutuzumab. Though CLL is incurable, it is treatable. This allergic reaction to treatment is rare but has been described as potential adverse effects of treatment. At this point in time, he does not need to worry about treatment for CLL  Acquired pancytopenia Likely due to underlying disease process. His hemoglobin and platelet count should improve over time after treatment is stopped He does not need transfusion unless bleeding or platelet count less  than 10,000 I would defer to primary service to determine transfusion threshold for blood  Significant cardiac arrhythmia status post cardiac arrest and resuscitation effort I explained to the patient and his wife that the CLL did not cause that the cardiac arrest. Rather, the patient likely have underlying cardiac abnormalities that would predispose him to recurrent cardiac arrhythmia with stress response to allergic reaction to treatment He is currently in monitoring unit. Due to low platelet count, I would not recommend anticoagulation therapy until platelet count is over 50,000 I would defer to cardiologist to discuss with patient and family about role of pacemaker implantation, etc.  Chronic kidney disease stage III This is stable He has not required hemodialysis Monitor closely  Moderate protein calorie malnutrition Consult dietitian  CODE STATUS He is currently full  code  Goals of care I have extensive discussion with the patient and his wife regarding goals of care I felt that his wife have unrealistic goals of care She wanted everything done to keep him alive for at least 5 more years I told her, statistically speaking, the patient has outlived the overall expectancy.  With combined diagnosis of recurrent CLL, chronic kidney disease stage III, recurrent cardiac arrest status post resuscitation, poor baseline performance status, protein calorie malnutrition, etc., I think it is unrealistic to expect, even with the most aggressive medical care, that the patient will outlive overall life expectancy by 5 more years When we talked about resuscitation efforts including CPR and ventilation support, the patient wants full code.  When I asked his wife related to hemodialysis or feeding tube placement, she does not seem to be keen for him to be placed on both hemodialysis of feeding tube. I think it is reasonable to consider a palliative care consult because I do not believe that both the patient and his wife has realistic expectation and given his recent recurrent cardiac arrest.  Discharge planning Unknown. I gave his wife my card. I will be happy to see him at an outpatient consult should he need treatment for recurrent CLL.  Otherwise, he can continue follow-up at [facility] center for future treatment. I will sign off.  All questions were answered. The patient knows to call the clinic with any problems, questions or concerns.    Heath Lark, MD 02/06/2017 7:49 AM

## 2017-02-06 NOTE — Progress Notes (Addendum)
Pt continues to have frequent pauses. The longest at 5.83. Pt is also complaining of 6/10 chest pain. EKG done and MD notified. MD ordered to cycle troponins and and get a BMP. Will continue to monitor.

## 2017-02-06 NOTE — Progress Notes (Signed)
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 Diabetes mellitus Hypertension  Presented to ED 9/7 syncopal needed CPR and had another episode of pulseless arrest Admitted by critical care Cardiology consulted    Assessment & Plan:   Active Problems:   Syncope   Tumor lysis syndrome   Cardiopulmonary arrest ([place])   Acute respiratory failure (HCC)   Metabolic acidosis   Encounter for central line placement   Encounter for imaging study to confirm orogastric (OG) tube placement   Typical atrial flutter (HCC)   Bradycardia   Syncope/complete heart block status post CPR X2 Pulseless arrest on admission Atrial flutter +Tachybradycardia CHad2Vasc2 score=4 CHF-CXR 9/25 suggestive of fluid  Stabilizing-not a candidate for pacemaker per EP--now given 5 second plus pauses is a candidate--defer to EP--appreciate input  +2 L since admission will start on Lasix by mouth 40 mg daily this morning  We will get strict ins and outs's  Hypotension secondary to poor pump function?  Not on any antihypertensives, continue Florinef  0.1 daily  Weaning Solu-Medrol. 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   Full code Inpatient Unclear at this time disposition  Consultants:   Cardiology  oncology  Procedures:   Echocardiogram 9/18 Study Conclusions  - Left ventricle: The cavity size was normal. Systolic function was   normal. The estimated ejection fraction was 55%. Wall motion was   normal; there were no regional wall motion abnormalities. There   was an increased relative contribution of atrial contraction to   ventricular filling. Doppler parameters are consistent with   abnormal left ventricular relaxation (grade 1 diastolic   dysfunction). - Aortic valve: Trileaflet; normal thickness, mildly calcified   leaflets. There was mild regurgitation. - Mitral valve: There was mild regurgitation. - Pulmonic valve: There was mild regurgitation. - Inferior vena cava: The vessel was mildly dilated. - Pericardium, extracardiac: A possible, trivial, free-flowing   pericardial effusion was identified at the apex. The fluid had no   internal echoes.  Antimicrobials:   Prophylaxis meds given CLL    Subjective:  Events noted overnight Patient had 5 second pauses necessitating ICU transfer this morning--he did not need a shock He is awake alert no distress has pacer pads on He understands options for care with oncology as well as with cardiology  Objective: Vitals:   02/06/17 0500 02/06/17 0600 02/06/17 0700 02/06/17 0800  BP: 95/64 119/73 107/63 126/72  Pulse: (!) 28 67 76 72  Resp: 13 16 11 15   Temp:   97.9 F (36.6 C)  TempSrc:   Oral   SpO2: 100% 100% 100% 100%  Weight:      Height:        Intake/Output Summary (Last 24 hours) at 02/06/17 0900 Last data filed at 02/06/17 0800  Gross per 24 hour  Intake              500 ml  Output              950 ml  Net             -450 ml   Filed Weights   02/04/17 0500 02/04/17 2213 02/06/17 0304  Weight: 67  kg (147 lb 11.3 oz) 68.5 kg (151 lb) 70.9 kg (156 lb 4.9 oz)    Examination:  Alert wasn't oriented slightly frail in no distress Wife at the bedside No bruit, irregular bradycardic rate  abdomen soft-no hepatosplenomegaly No clubbing Neurologically is intact  Data Reviewed: I have personally reviewed following labs and imaging studies  CBC:  Recent Labs Lab 02/01/17 1305 02/02/17 0400 02/02/17 2210 02/03/17 0456 02/04/17 0406  WBC 6.4 5.3 7.1 6.0 6.4  NEUTROABS  --   --  3.3  --   --   HGB 8.1* 7.6* 7.5* 7.3* 7.1*  HCT 24.3* 22.8* 22.6* 22.0* 21.2*  MCV 105.2* 104.6* 104.1* 104.8* 106.0*  PLT 24* 24* 27* 27* 46*   Basic Metabolic Panel:  Recent Labs Lab 01/31/17 0353  02/01/17 0400  02/02/17 0400  02/04/17 0406 02/04/17 1546 02/05/17 0450 02/05/17 1535 02/06/17 0808  NA 135  < > 136  < > 135  < > 137 136 137 136 138  K 4.0  < > 3.7  < > 4.2  < > 3.8 3.7 3.7 3.4* 3.6  CL 104  < > 104  < > 107  < > 108 107 109 108 109  CO2 25  < > 25  < > 22  < > 24 21* 23 23 26   GLUCOSE 187*  < > 127*  < > 113*  < > 173* 255* 178* 157* 165*  BUN 22*  < > 32*  < > 35*  < > 39* 40* 41* 40* 39*  CREATININE 0.91  < > 1.81*  < > 1.98*  < > 1.78* 1.89* 1.74* 1.72* 1.63*  CALCIUM 7.3*  < > 7.3*  < > 7.4*  < > 7.5* 7.6* 7.5* 7.7* 7.6*  MG 2.4  --  2.3  --  2.3  --   --   --   --   --   --   PHOS 2.3*  < > 3.3  < > 3.5  < > 4.0 3.6 3.9 3.4 4.0  < > = values in this interval not displayed. GFR: Estimated Creatinine Clearance: 31.8 mL/min (A) (by C-G formula based on SCr of 1.63 mg/dL (H)). Liver Function Tests:  Recent Labs Lab 02/04/17 0406 02/04/17 1546 02/05/17 0450 02/05/17 1535 02/06/17 0808  ALBUMIN 2.5* 2.7* 2.4* 2.7* 2.4*   No results for input(s): LIPASE, AMYLASE in the last 168 hours. No results for input(s): AMMONIA in the last 168 hours. Coagulation Profile:  Recent Labs Lab 02/04/17 0406  INR 1.17   Cardiac Enzymes: No results for input(s): CKTOTAL, CKMB,  CKMBINDEX, TROPONINI in the last 168 hours. BNP (last 3 results) No results for input(s): PROBNP in the last 8760 hours. HbA1C: No results for input(s): HGBA1C in the last 72 hours. CBG:  Recent Labs Lab 02/05/17 1307 02/05/17 1627  02/05/17 2147 02/06/17 0319 02/06/17 0754  GLUCAP 181* 140* 179* 145* 167*   Lipid Profile: No results for input(s): CHOL, HDL, LDLCALC, TRIG, CHOLHDL, LDLDIRECT in the last 72 hours. Thyroid Function Tests: No results for input(s): TSH, T4TOTAL, FREET4, T3FREE, THYROIDAB in the last 72 hours. Anemia Panel: No results for input(s): VITAMINB12, FOLATE, FERRITIN, TIBC, IRON, RETICCTPCT in the last 72 hours. Urine analysis:    Component Value Date/Time   COLORURINE STRAW (A) 01/28/2017 1857   APPEARANCEUR CLEAR 01/28/2017 1857   LABSPEC 1.006 01/28/2017 1857   PHURINE 6.0 01/28/2017 1857   GLUCOSEU NEGATIVE 01/28/2017 1857   HGBUR NEGATIVE 01/28/2017 1857   BILIRUBINUR NEGATIVE 01/28/2017 1857   KETONESUR NEGATIVE 01/28/2017 1857   PROTEINUR 100 (A) 01/28/2017 1857   UROBILINOGEN 0.2 02/11/2015 1718   NITRITE NEGATIVE 01/28/2017 1857   LEUKOCYTESUR NEGATIVE 01/28/2017 1857     Radiology Studies: Reviewed images personally in health database    Scheduled Meds: . atovaquone  1,500 mg Oral Q breakfast  . atropine      . Chlorhexidine Gluconate Cloth  6 each Topical Daily  . docusate sodium  100 mg Oral BID  . fludrocortisone  0.1 mg Oral Daily  . hydrocortisone sod succinate (SOLU-CORTEF) inj  50 mg Intravenous Q6H  . insulin aspart  0-20 Units Subcutaneous TID WC  . levETIRAcetam  750 mg Oral BID  . mupirocin ointment  1 application Nasal BID   Continuous Infusions: . sodium chloride 10 mL/hr at 02/01/17 0400  . sodium chloride Stopped (02/06/17 0500)     LOS: 9 days    Time spent: [place], MD [facility] [place]   If 7PM-7AM, please contact night-coverage [email or web address] Password [email or web address] 02/06/2017, 9:00  AM

## 2017-02-06 NOTE — Consult Note (Signed)
           [facility] CM Primary Care Navigator  02/06/2017  BRIXON ZHEN May 25, 1935 [id number]   Went to see patient at the bedside to identify possible discharge needs but RN reports that patient was transferred to [place] early this morning.   The patient has been transferred from [place] [facility] to a cardiac monitoring unit due to significant bradycardia/ arrhythmia per MD note.  Will attempt to see patient at another time when out of ICU.   For questions, please contact:  Dannielle Huh, BSN, RN- [facility] Primary Care Navigator  Telephone: [phone] [place]

## 2017-02-07 ENCOUNTER — Ambulatory Visit (HOSPITAL_COMMUNITY): Payer: Medicare Other

## 2017-02-07 ENCOUNTER — Encounter (HOSPITAL_COMMUNITY): Payer: Self-pay | Admitting: Cardiology

## 2017-02-07 ENCOUNTER — Inpatient Hospital Stay (HOSPITAL_COMMUNITY)
Admission: EM | Disposition: A | Discharge status: Home Health | Payer: Self-pay | Source: Home / Self Care | Attending: Family Medicine

## 2017-02-07 HISTORY — PX: PACEMAKER IMPLANT: EP1218

## 2017-02-07 LAB — CBC
HCT: 21.9 % — ABNORMAL LOW (ref 39.0–52.0)
HEMOGLOBIN: 7.1 g/dL — AB (ref 13.0–17.0)
MCH: 35.1 pg — AB (ref 26.0–34.0)
MCHC: 32.4 g/dL (ref 30.0–36.0)
MCV: 108.4 fL — ABNORMAL HIGH (ref 78.0–100.0)
PLATELETS: 55 10*3/uL — AB (ref 150–400)
RBC: 2.02 MIL/uL — AB (ref 4.22–5.81)
RDW: 18.5 % — ABNORMAL HIGH (ref 11.5–15.5)
WBC: 13.6 10*3/uL — AB (ref 4.0–10.5)

## 2017-02-07 LAB — COMPREHENSIVE METABOLIC PANEL
ALBUMIN: 2.5 g/dL — AB (ref 3.5–5.0)
ALK PHOS: 60 U/L (ref 38–126)
ALT: 26 U/L (ref 17–63)
AST: 25 U/L (ref 15–41)
Anion gap: 7 (ref 5–15)
BUN: 36 mg/dL — AB (ref 6–20)
CALCIUM: 7.4 mg/dL — AB (ref 8.9–10.3)
CHLORIDE: 106 mmol/L (ref 101–111)
CO2: 24 mmol/L (ref 22–32)
CREATININE: 1.59 mg/dL — AB (ref 0.61–1.24)
GFR calc non Af Amer: 40 mL/min — ABNORMAL LOW (ref >60)
GFR, EST AFRICAN AMERICAN: 46 mL/min — AB (ref >60)
GLUCOSE: 121 mg/dL — AB (ref 65–99)
Potassium: 3.1 mmol/L — ABNORMAL LOW (ref 3.5–5.1)
SODIUM: 137 mmol/L (ref 135–145)
Total Bilirubin: 0.9 mg/dL (ref 0.3–1.2)
Total Protein: 4.4 g/dL — ABNORMAL LOW (ref 6.5–8.1)

## 2017-02-07 LAB — RENAL FUNCTION PANEL
ANION GAP: 4 — AB (ref 5–15)
Albumin: 2.8 g/dL — ABNORMAL LOW (ref 3.5–5.0)
BUN: 36 mg/dL — ABNORMAL HIGH (ref 6–20)
CHLORIDE: 109 mmol/L (ref 101–111)
CO2: 25 mmol/L (ref 22–32)
CREATININE: 1.63 mg/dL — AB (ref 0.61–1.24)
Calcium: 7.5 mg/dL — ABNORMAL LOW (ref 8.9–10.3)
GFR, EST AFRICAN AMERICAN: 45 mL/min — AB (ref >60)
GFR, EST NON AFRICAN AMERICAN: 38 mL/min — AB (ref >60)
Glucose, Bld: 175 mg/dL — ABNORMAL HIGH (ref 65–99)
Phosphorus: 3 mg/dL (ref 2.5–4.6)
Potassium: 4.1 mmol/L (ref 3.5–5.1)
Sodium: 138 mmol/L (ref 135–145)

## 2017-02-07 LAB — GLUCOSE, CAPILLARY
GLUCOSE-CAPILLARY: 179 mg/dL — AB (ref 65–99)
GLUCOSE-CAPILLARY: 165 mg/dL — AB (ref 65–99)
Glucose-Capillary: 128 mg/dL — ABNORMAL HIGH (ref 65–99)
Glucose-Capillary: 151 mg/dL — ABNORMAL HIGH (ref 65–99)

## 2017-02-07 LAB — PROTIME-INR
INR: 1.12
Prothrombin Time: 14.4 seconds (ref 11.4–15.2)

## 2017-02-07 SURGERY — PACEMAKER IMPLANT

## 2017-02-07 MED ORDER — HEPARIN (PORCINE) IN NACL 2-0.9 UNIT/ML-% IJ SOLN
INTRAMUSCULAR | Status: AC
  Filled 2017-02-07: qty 500

## 2017-02-07 MED ORDER — LIDOCAINE HCL 2 % IJ SOLN
INTRAMUSCULAR | Status: AC
  Filled 2017-02-07: qty 10

## 2017-02-07 MED ORDER — CEFAZOLIN SODIUM-DEXTROSE 2-4 GM/100ML-% IV SOLN
INTRAVENOUS | Status: AC
  Filled 2017-02-07: qty 100

## 2017-02-07 MED ORDER — CHLORHEXIDINE GLUCONATE 4 % EX LIQD
60.0000 mL | Freq: Once | CUTANEOUS | Status: AC
  Administered 2017-02-07: 4 via TOPICAL
  Filled 2017-02-07: qty 60

## 2017-02-07 MED ORDER — IOPAMIDOL (ISOVUE-370) INJECTION 76%
INTRAVENOUS | Status: AC
  Filled 2017-02-07: qty 50

## 2017-02-07 MED ORDER — CEFAZOLIN SODIUM-DEXTROSE 1-4 GM/50ML-% IV SOLN
1.0000 g | Freq: Four times a day (QID) | INTRAVENOUS | Status: AC
  Administered 2017-02-07 – 2017-02-08 (×3): 1 g via INTRAVENOUS
  Filled 2017-02-07 (×3): qty 50

## 2017-02-07 MED ORDER — ACETAMINOPHEN 325 MG PO TABS: 325.0000 mg | ORAL_TABLET | ORAL | Status: DC | PRN

## 2017-02-07 MED ORDER — POTASSIUM CHLORIDE CRYS ER 20 MEQ PO TBCR
30.0000 meq | EXTENDED_RELEASE_TABLET | ORAL | Status: AC
  Administered 2017-02-07 (×2): 30 meq via ORAL
  Filled 2017-02-07 (×2): qty 1

## 2017-02-07 MED ORDER — ONDANSETRON HCL 4 MG/2ML IJ SOLN: 4.0000 mg | Freq: Four times a day (QID) | INTRAMUSCULAR | Status: DC | PRN

## 2017-02-07 MED ORDER — IOPAMIDOL (ISOVUE-370) INJECTION 76%
INTRAVENOUS | Status: DC | PRN
  Administered 2017-02-07: 15 mL via INTRAVENOUS

## 2017-02-07 MED ORDER — LIDOCAINE HCL (PF) 1 % IJ SOLN
INTRAMUSCULAR | Status: DC | PRN
  Administered 2017-02-07: 30 mL via INTRADERMAL

## 2017-02-07 MED ORDER — BISACODYL 10 MG RE SUPP
10.0000 mg | Freq: Every day | RECTAL | Status: DC | PRN
  Administered 2017-02-07: 10 mg via RECTAL
  Filled 2017-02-07: qty 1

## 2017-02-07 MED ORDER — POTASSIUM CHLORIDE 10 MEQ/100ML IV SOLN
10.0000 meq | Freq: Once | INTRAVENOUS | Status: AC
  Administered 2017-02-07: 10 meq via INTRAVENOUS
  Filled 2017-02-07: qty 100

## 2017-02-07 MED ORDER — LIDOCAINE HCL 2 % IJ SOLN
INTRAMUSCULAR | Status: AC
  Filled 2017-02-07: qty 20

## 2017-02-07 MED ORDER — GENTAMICIN SULFATE 40 MG/ML IJ SOLN
INTRAMUSCULAR | Status: AC
  Filled 2017-02-07: qty 2

## 2017-02-07 MED ORDER — HEPARIN (PORCINE) IN NACL 2-0.9 UNIT/ML-% IJ SOLN
INTRAMUSCULAR | Status: AC | PRN
  Administered 2017-02-07: 500 mL

## 2017-02-07 SURGICAL SUPPLY — 8 items
CABLE SURGICAL S-101-97-12 (CABLE) ×2 IMPLANT
IPG PACE AZUR XT DR MRI W1DR01 (Pacemaker) IMPLANT
LEAD CAPSURE NOVUS 45CM (Lead) ×2 IMPLANT
LEAD CAPSURE NOVUS 5076-52CM (Lead) ×2 IMPLANT
PACE AZURE XT DR MRI W1DR01 (Pacemaker) ×3 IMPLANT
PAD DEFIB LIFELINK (PAD) ×2 IMPLANT
SHEATH CLASSIC 7F (SHEATH) ×4 IMPLANT
TRAY PACEMAKER INSERTION (PACKS) ×2 IMPLANT

## 2017-02-07 NOTE — Progress Notes (Signed)
Progress Note  Patient Name: Randall Reyes Date of Encounter: 02/07/2017  Primary Cardiologist: Randall Reyes  Subjective   Continued atrial flutter. Feeling well without chest pain or SOB.  Inpatient Medications    Scheduled Meds: . atovaquone  1,500 mg Oral Q breakfast  . chlorhexidine  60 mL Topical Once  . Chlorhexidine Gluconate Cloth  6 each Topical Daily  . docusate sodium  100 mg Oral BID  . gentamicin irrigation  80 mg Irrigation To Cath  . hydrocortisone sod succinate (SOLU-CORTEF) inj  50 mg Intravenous Q12H  . insulin aspart  0-20 Units Subcutaneous TID WC  . levETIRAcetam  750 mg Oral BID  . mupirocin ointment  1 application Nasal BID  . potassium chloride  30 mEq Oral Q1 Hr x 2  . sodium chloride flush  3 mL Intravenous Q12H   Continuous Infusions: . sodium chloride 10 mL/hr at 02/01/17 0400  . sodium chloride Stopped (02/06/17 0500)  . sodium chloride 50 mL/hr at 02/07/17 0435  . sodium chloride    .  ceFAZolin (ANCEF) IV    . potassium chloride     PRN Meds: HYDROcodone-acetaminophen, sodium chloride flush, sodium chloride flush   Vital Signs    Vitals:   02/07/17 0434 02/07/17 0500 02/07/17 0600 02/07/17 0736  BP:  120/79 96/75 97/64   Pulse:  (!) 53 89 61  Resp:  13 10 15   Temp:    97.9 F (36.6 C)  TempSrc:    Oral  SpO2:  98% 91% 100%  Weight: 154 lb 8.7 oz (70.1 kg)     Height:        Intake/Output Summary (Last 24 hours) at 02/07/17 0753 Last data filed at 02/07/17 0736  Gross per 24 hour  Intake           580.83 ml  Output             1625 ml  Net         -1044.17 ml   Filed Weights   02/04/17 2213 02/06/17 0304 02/07/17 0434  Weight: 151 lb (68.5 kg) 156 lb 4.9 oz (70.9 kg) 154 lb 8.7 oz (70.1 kg)    Telemetry    Atrial flutter, episodic pauses - Personally Reviewed  ECG    Atrial flutter with variable block - Personally Reviewed   Physical Exam   GEN: Well nourished, well developed, in no acute distress  HEENT:  normal  Neck: no JVD, carotid bruits, or masses Cardiac: iRRR; no murmurs, rubs, or gallops,no edema  Respiratory:  clear to auscultation bilaterally, normal work of breathing GI: soft, nontender, nondistended, + BS MS: no deformity or atrophy  Skin: warm and dry Neuro:  Strength and sensation are intact Psych: euthymic mood, full affect   Labs    Chemistry  Recent Labs Lab 02/06/17 0808 02/06/17 1331 02/07/17 0428  NA 138 139 137  K 3.6 3.3* 3.1*  CL 109 110 106  CO2 26 26 24   GLUCOSE 165* 111* 121*  BUN 39* 38* 36*  CREATININE 1.63* 1.63* 1.59*  CALCIUM 7.6* 7.7* 7.4*  PROT  --   --  4.4*  ALBUMIN 2.4* 2.5* 2.5*  AST  --   --  25  ALT  --   --  26  ALKPHOS  --   --  60  BILITOT  --   --  0.9  GFRNONAA 38* 38* 40*  GFRAA 45* 45* 46*  ANIONGAP 3* 3* 7  Hematology  Recent Labs Lab 02/04/17 0406 02/06/17 0900 02/07/17 0428  WBC 6.4 8.2 13.6*  RBC 2.00* 2.01* 2.02*  HGB 7.1* 7.1* 7.1*  HCT 21.2* 21.6* 21.9*  MCV 106.0* 107.5* 108.4*  MCH 35.5* 35.3* 35.1*  MCHC 33.5 32.9 32.4  RDW 18.6* 18.3* 18.5*  PLT 46* 48* 55*    Cardiac Enzymes  Recent Labs Lab 02/06/17 0808 02/06/17 1331  TROPONINI <0.03 <0.03   No results for input(s): TROPIPOC in the last 168 hours.   BNPNo results for input(s): BNP, PROBNP in the last 168 hours.   DDimer No results for input(s): DDIMER in the last 168 hours.   Radiology    Dg Chest 2 View  Result Date: 02/05/2017 CLINICAL DATA:  Pleuritic pain, diabetes EXAM: CHEST  2 VIEW COMPARISON:  Portable chest x-ray of 02/02/2017 FINDINGS: There are small bilateral pleural effusions present with mild basilar volume loss. There is moderate cardiomegaly noted with minimal congestion, consistent with very mild CHF. Right-sided Port-A-Cath tip overlies the lower SVC. No bony abnormality is seen. IMPRESSION: 1. Probable mild CHF with cardiomegaly, small effusions, and minimal congestion. 2. Port-A-Cath tip overlies the lower SVC.  Electronically Signed   By: [name] M.D.   On: 02/05/2017 09:18    Cardiac Studies  01/29/17 TTE Study Conclusions - Left ventricle: The cavity size was normal. Systolic function was   normal. The estimated ejection fraction was 55%. Wall motion was   normal; there were no regional wall motion abnormalities. There   was an increased relative contribution of atrial contraction to   ventricular filling. Doppler parameters are consistent with   abnormal left ventricular relaxation (grade 1 diastolic   dysfunction). - Aortic valve: Trileaflet; normal thickness, mildly calcified   leaflets. There was mild regurgitation. - Mitral valve: There was mild regurgitation. - Pulmonic valve: There was mild regurgitation. - Inferior vena cava: The vessel was mildly dilated. - Pericardium, extracardiac: A possible, trivial, free-flowing   pericardial effusion was identified at the apex. The fluid had no   internal echoes.  Patient Profile     81 y.o. male admitted with syncope/CPR due to prolonged heart block, hypotension, AMS, now better.   Assessment & Plan    1. Heart block in the setting of hypokalemia   2. Atrial flutter with slow response 3. Hypotension 4. CLL 5  Thrombocytopenia 6.  Hypokalemia    Had syncope with pauses seen at admission. Has developed atrial flutter with slow response and further pauses. Due to that, [name] plan for pacemaker implant. Risks and benefits discussed. Risks include but not limited to bleeding, infection, tamponade, pneumothorax. The patient and his wife understand the risks. I have discussed with them that the risk of infection and bleeding is higher due to CLL. His K is low and [name] replete this AM prior to the pacemaker implant. [name] not anticoagulate at this time due to thrombocytopenia.

## 2017-02-07 NOTE — Progress Notes (Signed)
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 Diabetes mellitus Hypertension  Presented to ED 9/7 syncopal needed CPR and had another episode of pulseless arrest Admitted by critical care Cardiology consulted    Assessment & Plan:   Active Problems:   Syncope   Tumor lysis syndrome   Cardiopulmonary arrest ([place])   Acute respiratory failure (HCC)   Metabolic acidosis   Encounter for central line placement   Encounter for imaging study to confirm orogastric (OG) tube placement   Typical atrial flutter (HCC)   Bradycardia   Syncope/complete heart block status post CPR X2 Pulseless arrest on admission Atrial flutter +Tachybradycardia CHad2Vasc2 score=4 CHF-CXR 9/25 suggestive of fluid  Stabilizing-5 second plus pauses 9/25 = pacemaker placement 9/27  EP is replacing K  + 800 ccdown from 2 L since admission--on Lasix by mouth 40 mg 9/26    Hypotension secondary to poor pump function?  Not on any antihypertensives, continue Florinef 0.1 daily  Weaning Solu-Medrol. 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   Patient is full code Admitted to stepdown unit 4 pacemaker placement later on today  might need transfusion if lower than 7. 1 AM  Consultants:   Cardiology  oncology  Procedures:   Echocardiogram 9/18 Study Conclusions  - Left ventricle: The cavity size was normal. Systolic function was   normal. The estimated ejection fraction was 55%. Wall motion was   normal; there were no regional wall motion abnormalities. There   was an increased relative contribution of atrial contraction to   ventricular filling. Doppler parameters are consistent with   abnormal left ventricular relaxation (grade 1 diastolic   dysfunction). - Aortic valve: Trileaflet; normal thickness, mildly calcified   leaflets. There was mild regurgitation. - Mitral valve: There was mild regurgitation. - Pulmonic valve: There was mild regurgitation. - Inferior vena cava: The vessel was mildly dilated. - Pericardium, extracardiac: A possible, trivial, free-flowing   pericardial effusion was identified at the apex. The fluid had no   internal echoes.  Antimicrobials:   Prophylaxis meds given CLL    Subjective:  Sitting up in bed without any oxygen on Eating and drinking has stopped for now as he is nothing by mouth for procedure No chest pain no shortness of breath Wife is at the bedside   Objective: Vitals:   02/07/17 0500 02/07/17 0600 02/07/17 0736 02/07/17 0800  BP: 120/79 96/75 97/64  113/65  Pulse: (!) 53 89 61 77  Resp: 13 10 15 13   Temp:   97.9 F (36.6 C) 97.9 F (36.6 C)  TempSrc:   Oral Oral  SpO2: 98% 91% 100% 97%  Weight:      Height:        Intake/Output Summary (Last 24 hours) at 02/07/17 0917 Last data filed at 02/07/17 0736  Gross per 24 hour  Intake           550.83 ml  Output             1425 ml  Net          -874.17 ml   Filed Weights   02/04/17  2213 02/06/17 0304 02/07/17 0434  Weight: 68.5 kg (151 lb) 70.9 kg (156 lb 4.9 oz) 70.1 kg (154 lb 8.7 oz)    Examination:  Alert and oriented No icterus no pallor Chest is clear Abdomen soft S1-S2 tachycardic irregularly irregular No lower extremity edema Non-equal kyphosis No JVD Neurologically intact   Data Reviewed: I have personally reviewed following labs and imaging studies  CBC:  Recent Labs Lab 02/02/17 2210 02/03/17 0456 02/04/17 0406 02/06/17 0900 02/07/17 0428  WBC 7.1 6.0 6.4 8.2 13.6*  NEUTROABS 3.3  --   --   --   --   HGB 7.5* 7.3* 7.1* 7.1* 7.1*  HCT 22.6* 22.0* 21.2* 21.6* 21.9*  MCV 104.1* 104.8* 106.0* 107.5* 108.4*  PLT 27* 27* 46* 48* 55*   Basic Metabolic Panel:  Recent Labs Lab 02/01/17 0400  02/02/17 0400  02/04/17 1546 02/05/17 0450 02/05/17 1535 02/06/17 0808 02/06/17 1331 02/07/17 0428  NA 136  < > 135  < > 136 137 136 138 139 137  K 3.7  < > 4.2  < > 3.7 3.7 3.4* 3.6 3.3* 3.1*  CL 104  < > 107  < > 107 109 108 109 110 106  CO2 25  < > 22  < > 21* 23 23 26 26 24   GLUCOSE 127*  < > 113*  < > 255* 178* 157* 165* 111* 121*  BUN 32*  < > 35*  < > 40* 41* 40* 39* 38* 36*  CREATININE 1.81*  < > 1.98*  < > 1.89* 1.74* 1.72* 1.63* 1.63* 1.59*  CALCIUM 7.3*  < > 7.4*  < > 7.6* 7.5* 7.7* 7.6* 7.7* 7.4*  MG 2.3  --  2.3  --   --   --   --   --   --   --   PHOS 3.3  < > 3.5  < > 3.6 3.9 3.4 4.0 4.2  --   < > = values in this interval not displayed. GFR: Estimated Creatinine Clearance: 32.4 mL/min (A) (by C-G formula based on SCr of 1.59 mg/dL (H)). Liver Function Tests:  Recent Labs Lab 02/05/17 0450 02/05/17 1535 02/06/17 0808 02/06/17 1331 02/07/17 0428  AST  --   --   --   --  25  ALT  --   --   --   --  26  ALKPHOS  --   --   --   --  60  BILITOT  --   --   --   --  0.9  PROT  --   --   --   --  4.4*  ALBUMIN 2.4* 2.7* 2.4* 2.5* 2.5*   No results for input(s): LIPASE, AMYLASE in the last 168 hours. No results for  input(s): AMMONIA in the last 168 hours. Coagulation Profile:  Recent Labs Lab 02/04/17 0406 02/07/17 0428  INR 1.17 1.12   Cardiac Enzymes:  Recent Labs Lab 02/06/17 0808 02/06/17 1331  TROPONINI <0.03 <0.03   BNP (last 3 results) No results for input(s): PROBNP in the last 8760 hours. HbA1C: No results for input(s): HGBA1C in the last 72 hours. CBG:  Recent Labs Lab 02/06/17 0754 02/06/17 1130 02/06/17 1527 02/06/17 2201 02/07/17 0735  GLUCAP 167* 130* 144* 180* 128*   Lipid Profile: No results for input(s): CHOL, HDL, LDLCALC, TRIG, CHOLHDL, LDLDIRECT in the last 72 hours. Thyroid Function Tests: No results for input(s): TSH, T4TOTAL, FREET4, T3FREE, THYROIDAB in the last 72 hours. Anemia Panel: No results for input(s): VITAMINB12, FOLATE, FERRITIN, TIBC, IRON, RETICCTPCT in the last 72 hours. Urine analysis:    Component Value Date/Time   COLORURINE STRAW (A) 01/28/2017 1857   APPEARANCEUR CLEAR 01/28/2017 1857   LABSPEC 1.006 01/28/2017 1857   PHURINE 6.0 01/28/2017 1857   GLUCOSEU NEGATIVE 01/28/2017 1857   HGBUR NEGATIVE 01/28/2017 1857   BILIRUBINUR NEGATIVE 01/28/2017 1857   KETONESUR NEGATIVE 01/28/2017 1857   PROTEINUR 100 (A) 01/28/2017 1857   UROBILINOGEN 0.2 02/11/2015 1718   NITRITE NEGATIVE 01/28/2017 1857   LEUKOCYTESUR NEGATIVE 01/28/2017 1857     Radiology Studies: Reviewed images personally in health database    Scheduled Meds: . atovaquone  1,500 mg Oral Q breakfast  . Chlorhexidine Gluconate Cloth  6 each Topical Daily  . docusate sodium  100 mg Oral BID  . gentamicin irrigation  80 mg Irrigation To Cath  . hydrocortisone sod succinate (SOLU-CORTEF) inj  50 mg Intravenous Q12H  . insulin aspart  0-20 Units Subcutaneous TID WC  . levETIRAcetam  750 mg Oral BID  . mupirocin ointment  1 application Nasal BID  . potassium chloride  30 mEq Oral Q1 Hr x 2  . sodium chloride flush  3 mL Intravenous Q12H   Continuous Infusions: .  sodium chloride 10 mL/hr at 02/01/17 0400  . sodium chloride Stopped (02/06/17 0500)  . sodium chloride 50 mL/hr at 02/07/17 0435  . sodium chloride 250 mL (02/07/17 0800)  .  ceFAZolin (ANCEF) IV       LOS: 10 days    Time spent: [place], MD [facility]   If 7PM-7AM, please contact night-coverage [email or web address] Password [facility] 02/07/2017, 9:17 AM

## 2017-02-07 NOTE — Progress Notes (Signed)
PT Cancellation Note  Patient Details Name: ITZEL LOWRIMORE MRN: [id number] DOB: [birth year]   Cancelled Treatment:    Reason Eval/Treat Not Completed: Patient at procedure or test/unavailable PT will continue to follow acutely.    [place] April, PTA Pager: [phone]   02/07/2017, 10:47 AM

## 2017-02-07 NOTE — Progress Notes (Signed)
Paged Cardiology MD on call about K+ being 3.1 this AM. No new orders have been received.

## 2017-02-08 ENCOUNTER — Other Ambulatory Visit (HOSPITAL_COMMUNITY): Payer: Medicare Other

## 2017-02-08 ENCOUNTER — Inpatient Hospital Stay (HOSPITAL_COMMUNITY): Payer: Medicare Other

## 2017-02-08 LAB — COMPREHENSIVE METABOLIC PANEL
ALK PHOS: 81 U/L (ref 38–126)
ALT: 22 U/L (ref 17–63)
ANION GAP: 7 (ref 5–15)
AST: 23 U/L (ref 15–41)
Albumin: 2.9 g/dL — ABNORMAL LOW (ref 3.5–5.0)
BILIRUBIN TOTAL: 0.9 mg/dL (ref 0.3–1.2)
BUN: 33 mg/dL — ABNORMAL HIGH (ref 6–20)
CALCIUM: 7.8 mg/dL — AB (ref 8.9–10.3)
CO2: 22 mmol/L (ref 22–32)
Chloride: 109 mmol/L (ref 101–111)
Creatinine, Ser: 1.62 mg/dL — ABNORMAL HIGH (ref 0.61–1.24)
GFR calc non Af Amer: 39 mL/min — ABNORMAL LOW (ref >60)
GFR, EST AFRICAN AMERICAN: 45 mL/min — AB (ref >60)
Glucose, Bld: 220 mg/dL — ABNORMAL HIGH (ref 65–99)
Potassium: 3.2 mmol/L — ABNORMAL LOW (ref 3.5–5.1)
Sodium: 138 mmol/L (ref 135–145)
TOTAL PROTEIN: 5.2 g/dL — AB (ref 6.5–8.1)

## 2017-02-08 LAB — GLUCOSE, CAPILLARY
GLUCOSE-CAPILLARY: 119 mg/dL — AB (ref 65–99)
Glucose-Capillary: 156 mg/dL — ABNORMAL HIGH (ref 65–99)
Glucose-Capillary: 210 mg/dL — ABNORMAL HIGH (ref 65–99)

## 2017-02-08 LAB — CBC WITH DIFFERENTIAL/PLATELET
BAND NEUTROPHILS: 0 %
BASOS PCT: 0 %
Basophils Absolute: 0 10*3/uL (ref 0.0–0.1)
Blasts: 0 %
EOS ABS: 0 10*3/uL (ref 0.0–0.7)
EOS PCT: 0 %
HCT: 27.9 % — ABNORMAL LOW (ref 39.0–52.0)
Hemoglobin: 9.1 g/dL — ABNORMAL LOW (ref 13.0–17.0)
LYMPHS ABS: 8.7 10*3/uL — AB (ref 0.7–4.0)
Lymphocytes Relative: 58 %
MCH: 35.7 pg — ABNORMAL HIGH (ref 26.0–34.0)
MCHC: 32.6 g/dL (ref 30.0–36.0)
MCV: 109.4 fL — ABNORMAL HIGH (ref 78.0–100.0)
MONO ABS: 0.2 10*3/uL (ref 0.1–1.0)
MYELOCYTES: 0 %
Metamyelocytes Relative: 0 %
Monocytes Relative: 1 %
NEUTROS PCT: 41 %
NRBC: 0 /100{WBCs}
Neutro Abs: 6.2 10*3/uL (ref 1.7–7.7)
Other: 0 %
PLATELETS: 58 10*3/uL — AB (ref 150–400)
PROMYELOCYTES ABS: 0 %
RBC: 2.55 MIL/uL — ABNORMAL LOW (ref 4.22–5.81)
RDW: 19.2 % — ABNORMAL HIGH (ref 11.5–15.5)
WBC: 15.1 10*3/uL — ABNORMAL HIGH (ref 4.0–10.5)

## 2017-02-08 LAB — MAGNESIUM: MAGNESIUM: 1.7 mg/dL (ref 1.7–2.4)

## 2017-02-08 LAB — PHOSPHORUS: PHOSPHORUS: 3.1 mg/dL (ref 2.5–4.6)

## 2017-02-08 LAB — PREPARE RBC (CROSSMATCH)

## 2017-02-08 MED ORDER — YOU HAVE A PACEMAKER BOOK
Freq: Once | Status: AC
  Administered 2017-02-08: 02:00:00
  Filled 2017-02-08: qty 1

## 2017-02-08 MED ORDER — POTASSIUM CHLORIDE CRYS ER 20 MEQ PO TBCR: 40.0000 meq | EXTENDED_RELEASE_TABLET | Freq: Two times a day (BID) | ORAL | 0 refills | Status: DC

## 2017-02-08 MED ORDER — HEPARIN SOD (PORK) LOCK FLUSH 100 UNIT/ML IV SOLN
500.0000 [IU] | INTRAVENOUS | Status: AC | PRN
  Administered 2017-02-08: 500 [IU]

## 2017-02-08 MED ORDER — APIXABAN 2.5 MG PO TABS: 2.5000 mg | ORAL_TABLET | Freq: Two times a day (BID) | ORAL | 0 refills | Status: DC

## 2017-02-08 MED ORDER — SODIUM CHLORIDE 0.9 % IV SOLN: Freq: Once | INTRAVENOUS | Status: DC

## 2017-02-08 MED ORDER — LEVETIRACETAM 100 MG/ML PO SOLN: 750.0000 mg | Freq: Two times a day (BID) | ORAL | 12 refills | Status: AC

## 2017-02-08 MED ORDER — ATOVAQUONE 750 MG/5ML PO SUSP: 1500.0000 mg | Freq: Every day | ORAL | 0 refills | Status: DC

## 2017-02-08 MED FILL — Lidocaine HCl Local Inj 2%: INTRAMUSCULAR | Qty: 20 | Status: AC

## 2017-02-08 MED FILL — Lidocaine HCl Local Inj 2%: INTRAMUSCULAR | Qty: 10 | Status: AC

## 2017-02-08 NOTE — Progress Notes (Signed)
Placed IV team consult to deaccess patients port.   Lucius Conn, RN

## 2017-02-08 NOTE — Progress Notes (Signed)
Progress Note  Patient Name: Randall Reyes Date of Encounter: 02/08/2017  Primary Cardiologist: Jacinta Shoe  Subjective   Continued atrial flutter. Feeling well without chest pain or SOB.  Inpatient Medications    Scheduled Meds: . atovaquone  1,500 mg Oral Q breakfast  . Chlorhexidine Gluconate Cloth  6 each Topical Daily  . docusate sodium  100 mg Oral BID  . hydrocortisone sod succinate (SOLU-CORTEF) inj  50 mg Intravenous Q12H  . insulin aspart  0-20 Units Subcutaneous TID WC  . levETIRAcetam  750 mg Oral BID  . mupirocin ointment  1 application Nasal BID   Continuous Infusions: . sodium chloride 10 mL/hr at 02/01/17 0400  . sodium chloride 10 mL/hr at 02/07/17 2000   PRN Meds: acetaminophen, bisacodyl, HYDROcodone-acetaminophen, ondansetron (ZOFRAN) IV, sodium chloride flush   Vital Signs    Vitals:   02/08/17 0500 02/08/17 0600 02/08/17 0700 02/08/17 0826  BP: 133/64 135/79 134/76   Pulse: 74 80 80 74  Resp: 16 12 14  (!) 23  Temp:    98 F (36.7 C)  TempSrc:    Oral  SpO2: 97% 97% 97% 96%  Weight:      Height:        Intake/Output Summary (Last 24 hours) at 02/08/17 0848 Last data filed at 02/08/17 0700  Gross per 24 hour  Intake              680 ml  Output              835 ml  Net             -155 ml   Filed Weights   02/06/17 0304 02/07/17 0434 02/08/17 0355  Weight: 156 lb 4.9 oz (70.9 kg) 154 lb 8.7 oz (70.1 kg) 128 lb 12 oz (58.4 kg)    Telemetry    Atrial flutter, 70's - Personally Reviewed  ECG    Atrial flutter, 78bpm, RBBB - Personally Reviewed   Physical Exam   GEN: Well nourished, well developed, in no acute distress  HEENT: normal  Neck: no JVD, carotid bruits, or masses Cardiac: RRR; no murmurs, rubs, or gallops,no edema  Respiratory:  clear to auscultation bilaterally, normal work of breathing GI: soft, nontender, nondistended, + BS MS: no deformity or atrophy  Skin: warm and dry, device site well healed Neuro:   Strength and sensation are intact Psych: euthymic mood, full affect    L chest pacer site dressing is dry, no bleeding, no hematoma  Labs    Chemistry  Recent Labs Lab 02/06/17 1331 02/07/17 0428 02/07/17 1622  NA 139 137 138  K 3.3* 3.1* 4.1  CL 110 106 109  CO2 26 24 25   GLUCOSE 111* 121* 175*  BUN 38* 36* 36*  CREATININE 1.63* 1.59* 1.63*  CALCIUM 7.7* 7.4* 7.5*  PROT  --  4.4*  --   ALBUMIN 2.5* 2.5* 2.8*  AST  --  25  --   ALT  --  26  --   ALKPHOS  --  60  --   BILITOT  --  0.9  --   GFRNONAA 38* 40* 38*  GFRAA 45* 46* 45*  ANIONGAP 3* 7 4*     Hematology  Recent Labs Lab 02/04/17 0406 02/06/17 0900 02/07/17 0428  WBC 6.4 8.2 13.6*  RBC 2.00* 2.01* 2.02*  HGB 7.1* 7.1* 7.1*  HCT 21.2* 21.6* 21.9*  MCV 106.0* 107.5* 108.4*  MCH 35.5* 35.3* 35.1*  MCHC 33.5 32.9  32.4  RDW 18.6* 18.3* 18.5*  PLT 46* 48* 55*    Cardiac Enzymes  Recent Labs Lab 02/06/17 0808 02/06/17 1331  TROPONINI <0.03 <0.03   No results for input(s): TROPIPOC in the last 168 hours.   BNPNo results for input(s): BNP, PROBNP in the last 168 hours.   DDimer No results for input(s): DDIMER in the last 168 hours.   Radiology    Dg Chest 2 View  Result Date: 02/08/2017 CLINICAL DATA:  Status post pacemaker placement EXAM: CHEST  2 VIEW COMPARISON:  02/05/2017 FINDINGS: New pacing device is noted in satisfactory position. Cardiac shadow is stable. No pneumothorax is noted. Increasing infiltrative change is noted in the lower lobes bilaterally. Small bilateral pleural effusions are noted. Right chest wall port is again seen. IMPRESSION: Increasing lower lobe infiltrate. Small pleural effusions bilaterally. No pneumothorax following pacer placement. Electronically Signed   By: Inez Catalina M.D.   On: 02/08/2017 08:27    Cardiac Studies  01/29/17 TTE Study Conclusions - Left ventricle: The cavity size was normal. Systolic function was   normal. The estimated ejection fraction was  55%. Wall motion was   normal; there were no regional wall motion abnormalities. There   was an increased relative contribution of atrial contraction to   ventricular filling. Doppler parameters are consistent with   abnormal left ventricular relaxation (grade 1 diastolic   dysfunction). - Aortic valve: Trileaflet; normal thickness, mildly calcified   leaflets. There was mild regurgitation. - Mitral valve: There was mild regurgitation. - Pulmonic valve: There was mild regurgitation. - Inferior vena cava: The vessel was mildly dilated. - Pericardium, extracardiac: A possible, trivial, free-flowing   pericardial effusion was identified at the apex. The fluid had no   internal echoes.  Patient Profile     81 y.o. male admitted with syncope/CPR due to prolonged heart block, hypotension, AMS, now better.   Assessment & Plan    1. Heart block in the setting of hypokalemia   S/p PPM yesterday with Dr. Curt Bears PPM site/L chest is stable, no bleeding or hematoma Device check this AM notes intact function Wound care and activity restrictions were discussed with the patient and wife at bedside CXR this AM is without ptx  2. Atrial flutter with slow response     Remains in flutter today, rates 70's     In review of chart, not and anticoagulation candidate  Continue care with primary/medicine team OK from EP/pacer standpoint to transfer out of ICU Wound check and EP follow up has been arranged  EP remains available, please recall if needed  PLEASE REMOVE EXTERIOR/TEGADERM DRESSING DAY OF DISCHARGE, LEAVE STERI-STRIPS IN PLACE  3. Hypotension     better 4. CLL 5  Thrombocytopenia 6.  Hypokalemia         I have seen and examined this patient with Randall Reyes.  Agree with above, note added to reflect my findings.  On exam, RRR, no murmurs, lungs clear. Remains in atrial flutter. Would plan to anticoagulate pending review from heme/onc. Pacemaker functioning appropriately. CXR and  interrogation without issue. Plan for follow up in device clinic in 10 days.    Leatta Alewine M. Kaycen Whitworth MD 02/08/2017 10:04 AM

## 2017-02-08 NOTE — Progress Notes (Signed)
Paged on call Hospitalist  M. Lynch MD, as patient's son called back to say that [facility] in [place] did not have patients Atovaquone in stock and wont be able to be filled until Monday. This RN paged Donnal Debar, MD to see if this medication would harm the patient if he missed it if they couldn't get it filled until then. MD states, "Its used prophylacticly, so he should be ok". This RN encouraged family to call around and get it filled at another pharmacy. This RN also asked them to call back up here if they have any questions.  Lucius Conn, RN

## 2017-02-08 NOTE — Discharge Summary (Addendum)
Physician Discharge Summary  Randall Reyes QIO:[id number] DOB: 03-04-1936 DOA: 01/28/2017  PCP: [name], MD  Admit date: 01/28/2017 Discharge date: 02/08/2017  Time spent: 35 minutes  Recommendations for Outpatient Follow-up:  1. Would recommend outpatient goals of care with her oncologist at [facility] who I will cc on this note 2. Have cautiously initiated Elliquis 2.5 twice a day-need a careful watch on complete metabolic panel as well as CBC and platelet count 3. Pacemaker interrogation as well as management as an outpatient with EP 4. Suggest outpatient goals of care given kidney dysfunction, atrial flutter and CLL  Discharge Diagnoses:  Active Problems:   Syncope   Tumor lysis syndrome   Cardiopulmonary arrest ([name])   Acute respiratory failure (HCC)   Metabolic acidosis   Encounter for central line placement   Encounter for imaging study to confirm orogastric (OG) tube placement   Typical atrial flutter ([place])   Bradycardia   Discharge Condition: improved  Diet recommendation: heart healthy  Filed Weights   02/06/17 0304 02/07/17 0434 02/08/17 0355  Weight: 70.9 kg (156 lb 4.9 oz) 70.1 kg (154 lb 8.7 oz) 58.4 kg (128 lb 12 oz)    History of present illness:  81 year old male CLL diagnosis 2006             Complicated by pancytopenia             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 Diabetes mellitus Hypertension  Presented to ED 9/7 syncopal needed CPR and had another episode of pulseless arrest Admitted by critical care Cardiology consulted--eventually had long pauses and got a pacemaker See below  Hospital Course:   Syncope/complete heart block status post CPR X2 Pulseless arrest on admission Atrial flutter +Tachybradycardia CHad2Vasc2 score=4 CHF-CXR 9/25 suggestive of fluid             Stabilizing-5 second plus pauses 9/25 = pacemaker placement 9/27             EP is replacing K--potassium was 3.2 on discharge              on Lasix by mouth 40 mg 9/26  Needs further outpatient management of his status  Did not need any beta blockade this admission or rate control as pauses and will need to be revisited carefully as an outpatient              Hypotension secondary to poor pump function?             Not on any antihypertensives, continue Florinef 0.1 daily-- this was discontinued on discharge             Weaning Solu-Medrol. 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             No need Tx PLT or blood at this time             Has hemoglobin was in the 7 range but came up to the 9 range on discharge             Oncologist input appreciated, D/W oncologist, feel may be able to use Elliquis 2.5 twice  a day--very cautiously with  Close monitoring of platelets as well as hemoglobin  CC to primary oncologist regarding further care and management--continue atovaquone until              No evidence of pneumonia on x-ray today  Hypokalemia  3.2 potassium on discharge and will need replacement therefore giving prescription for 40 mEq twice daily on DC  Overall his prognosis is reasonable but long-term is guarded He will need goals of care discussion as an outpatient regarding his multiple illnesses and I would suggest the oncologist have a realistic discussion with him  Procedures:  pacemaker placement 9/28   Consultations:  Cardiology  Electrophysiology  Discharge Exam: Vitals:   02/08/17 1058 02/08/17 1100  BP:  (!) 97/58  Pulse: 81 83  Resp: 20 16  Temp:  97.7 F (36.5 C)  SpO2: 96% 100%    General: alert pleasant oriented no distress eating drinking Cardiovascular: S1-S2 atrial flutter Respiratory: clinically clear no added sound  Discharge  Instructions   Discharge Instructions    Diet - low sodium heart healthy    Complete by:  As directed    Discharge instructions    Complete by:  As directed    Monitor with blood counts and follow in 1 week with Oncology and Electrophysiology Needs discussion re: further Oncology care as OP Dr. Talbert Cage Starting a blood thinner this admission for irregular Heart beat-you will need discussion with specialist regarding this   Increase activity slowly    Complete by:  As directed      Current Discharge Medication List    START taking these medications   Details  apixaban (ELIQUIS) 2.5 MG TABS tablet Take 1 tablet (2.5 mg total) by mouth 2 (two) times daily. Qty: 60 tablet, Refills: 0    atovaquone (MEPRON) 750 MG/5ML suspension Take 10 mLs (1,500 mg total) by mouth daily with breakfast. Qty: 210 mL, Refills: 0    levETIRAcetam (KEPPRA) 100 MG/ML solution Take 7.5 mLs (750 mg total) by mouth 2 (two) times daily. Qty: 473 mL, Refills: 12    potassium chloride SA (K-DUR,KLOR-CON) 20 MEQ tablet Take 2 tablets (40 mEq total) by mouth 2 (two) times daily. Qty: 14 tablet, Refills: 0      CONTINUE these medications which have NOT CHANGED   Details  acetaminophen (TYLENOL) 500 MG tablet Take 500 mg by mouth at bedtime as needed for mild pain or moderate pain.     acyclovir (ZOVIRAX) 400 MG tablet Take 1 tablet (400 mg total) by mouth daily. Qty: 30 tablet, Refills: 5   Associated Diagnoses: CLL (chronic lymphocytic leukemia) (HCC)    allopurinol (ZYLOPRIM) 300 MG tablet Take 1 tablet (300 mg total) by mouth daily. Qty: 30 tablet, Refills: 0   Associated Diagnoses: CLL (chronic lymphocytic leukemia) (HCC)    chlorambucil (LEUKERAN) 2 MG tablet Take 32 mg PO (16 tablets) on Day 1 and 15 of each 28 day cycle of chemotherapy.  Give on an empty stomach 1 hour before or 2 hours after meals. Qty: 64 tablet, Refills: 0   Associated Diagnoses: CLL (chronic lymphocytic leukemia) (HCC)     cholecalciferol (VITAMIN D) 400 units TABS tablet Take 400 Units by mouth daily.    lidocaine-prilocaine (EMLA) cream Apply a quarter size amount to affected area 1 hour prior to coming to chemotherapy.  Do not rub in.  Cover with plastic wrap. Qty: 30 g, Refills: 2    Obinutuzumab (GAZYVA IV) Inject into the vein as  directed.     ondansetron (ZOFRAN) 8 MG tablet Take 1 tablet (8 mg total) by mouth every 8 (eight) hours as needed for nausea or vomiting. Qty: 30 tablet, Refills: 2    prochlorperazine (COMPAZINE) 10 MG tablet Take 1 tablet (10 mg total) by mouth every 6 (six) hours as needed for nausea or vomiting. Qty: 30 tablet, Refills: 2    rosuvastatin (CRESTOR) 40 MG tablet Take 40 mg by mouth at bedtime.     sevelamer carbonate (RENVELA) 800 MG tablet Take 2 tablets (1,600 mg total) by mouth 3 (three) times daily with meals. Qty: 180 tablet, Refills: 2    sitaGLIPtin (JANUVIA) 100 MG tablet Take 100 mg by mouth daily.    sodium bicarbonate 650 MG tablet TAKE 1 TABLET BY MOUTH TWICE A DAY AS DIRECTED Refills: 3    sulfamethoxazole-trimethoprim (BACTRIM DS,SEPTRA DS) 800-160 MG tablet Take 1 tablet by mouth 3 (three) times a week. Qty: 12 tablet, Refills: 5   Associated Diagnoses: CLL (chronic lymphocytic leukemia) (HCC)       Allergies  Allergen Reactions  . Aspirin Other (See Comments)    Bleeding ulcers.   . Ibuprofen Other (See Comments)    Bleeding ulcers.   Follow-up Information    [place] [facility] Follow up on 02/21/2017.   Specialty:  Cardiology Why:  11:00AM, wound check visit Contact information: [address], [address] [place]       Constance Haw, MD Follow up on 05/06/2017.   Specialty:  Cardiology Why:  11:00AM Contact information: [place] [address] [phone]            The results of significant diagnostics from this hospitalization  (including imaging, microbiology, ancillary and laboratory) are listed below for reference.    Significant Diagnostic Studies: Dg Chest 2 View  Result Date: 02/08/2017 CLINICAL DATA:  Status post pacemaker placement EXAM: CHEST  2 VIEW COMPARISON:  02/05/2017 FINDINGS: New pacing device is noted in satisfactory position. Cardiac shadow is stable. No pneumothorax is noted. Increasing infiltrative change is noted in the lower lobes bilaterally. Small bilateral pleural effusions are noted. Right chest wall port is again seen. IMPRESSION: Increasing lower lobe infiltrate. Small pleural effusions bilaterally. No pneumothorax following pacer placement. Electronically Signed   By: Inez Catalina M.D.   On: 02/08/2017 08:27   Dg Chest 2 View  Result Date: 02/05/2017 CLINICAL DATA:  Pleuritic pain, diabetes EXAM: CHEST  2 VIEW COMPARISON:  Portable chest x-ray of 02/02/2017 FINDINGS: There are small bilateral pleural effusions present with mild basilar volume loss. There is moderate cardiomegaly noted with minimal congestion, consistent with very mild CHF. Right-sided Port-A-Cath tip overlies the lower SVC. No bony abnormality is seen. IMPRESSION: 1. Probable mild CHF with cardiomegaly, small effusions, and minimal congestion. 2. Port-A-Cath tip overlies the lower SVC. Electronically Signed   By: [name] M.D.   On: 02/05/2017 09:18   Dg Chest Port 1 View  Result Date: 02/02/2017 CLINICAL DATA:  Respiratory failure EXAM: PORTABLE CHEST 1 VIEW COMPARISON:  02/01/2017 FINDINGS: Right chest wall port and left jugular temporary dialysis catheter are again seen and stable. Aortic calcifications are seen. Cardiomegaly is again noted. The lungs are well aerated with some degree of vascular congestion. Stable basilar changes are noted bilaterally. IMPRESSION: No significant interval change from the prior exam. Electronically Signed   By: Inez Catalina M.D.   On: 02/02/2017 07:04  Dg Chest Port 1 View  Result Date:  02/01/2017 CLINICAL DATA:  Respiratory failure . EXAM: PORTABLE CHEST 1 VIEW COMPARISON:  01/30/2017. FINDINGS: Interim extubation and removal of NG tube. Bilateral IJ lines in stable position. Stable cardiomegaly . Bilateral lower lobe pulmonary infiltrates with basilar atelectasis again noted. No interim change. No pleural effusion or pneumothorax . IMPRESSION: 1. Interim extubation removal of NG tube. Bilateral IJ lines in stable position. 2. Bilateral lower lobe pulmonary infiltrates and atelectasis again noted. No interim change. 3. Stable cardiomegaly. Electronically Signed   By: Marcello Moores  Register   On: 02/01/2017 06:24   Dg Chest Port 1 View  Result Date: 01/30/2017 CLINICAL DATA:  Respiratory failure EXAM: PORTABLE CHEST 1 VIEW COMPARISON:  01/29/2017 FINDINGS: Support devices are stable. Bilateral perihilar and lower lobe opacities are unchanged. Node visible effusions. Heart is borderline in size. IMPRESSION: Bilateral lower lobe airspace opacities, unchanged. Electronically Signed   By: Rolm Baptise M.D.   On: 01/30/2017 08:28   Dg Chest Port 1 View  Result Date: 01/29/2017 CLINICAL DATA:  Orogastric tube placement and central line placement EXAM: PORTABLE CHEST 1 VIEW COMPARISON:  01/28/2017 FINDINGS: Endotracheal tube has been inserted and the tip is about 4.7 cm superior to the carina. Right-sided central venous catheter tip overlies the SVC. Left jugular venous catheter tips overlie a the brachiocephalic confluence. No pneumothorax is seen. Borderline cardiomegaly. Development of patchy right greater than left lower lung opacity. IMPRESSION: 1. Endotracheal tube tip about 4.7 cm superior to carina. Left-sided central venous catheter tips overlie the brachiocephalic confluence. No pneumothorax 2. Interim development of right greater than left lower lung airspace disease which may reflect pneumonia or asymmetric edema. Electronically Signed   By: Donavan Foil M.D.   On: 01/29/2017 01:25   Dg  Chest Port 1 View  Result Date: 01/28/2017 CLINICAL DATA:  Dyspnea, pt has external pacer pad and c-pap mask EXAM: PORTABLE CHEST 1 VIEW COMPARISON:  Chest x-ray dated 08/24/2014. FINDINGS: Heart size and mediastinal contours are within normal limits given patient's oblique positioning. Pacer pad overlies the heart. Lungs are clear. No pleural effusion or pneumothorax seen. No acute or suspicious osseous finding. IMPRESSION: No active disease.  No evidence of pneumonia or pulmonary edema. Electronically Signed   By: Franki Cabot M.D.   On: 01/28/2017 20:02    Microbiology: Recent Results (from the past 240 hour(s))  Surgical pcr screen     Status: Abnormal   Collection Time: 02/04/17  5:09 AM  Result Value Ref Range Status   MRSA, PCR NEGATIVE NEGATIVE Final   Staphylococcus aureus POSITIVE (A) NEGATIVE Final    Comment: (NOTE) The Xpert SA Assay (FDA approved for NASAL specimens in patients 38 years of age and older), is one component of a comprehensive surveillance program. It is not intended to diagnose infection nor to guide or monitor treatment.      Labs: Basic Metabolic Panel:  Recent Labs Lab 02/02/17 0400  02/05/17 1535 02/06/17 0808 02/06/17 1331 02/07/17 0428 02/07/17 1622 02/08/17 0936  NA 135  < > 136 138 139 137 138 138  K 4.2  < > 3.4* 3.6 3.3* 3.1* 4.1 3.2*  CL 107  < > 108 109 110 106 109 109  CO2 22  < > 23 26 26 24 25 22   GLUCOSE 113*  < > 157* 165* 111* 121* 175* 220*  BUN 35*  < > 40* 39* 38* 36* 36* 33*  CREATININE 1.98*  < > 1.72* 1.63*  1.63* 1.59* 1.63* 1.62*  CALCIUM 7.4*  < > 7.7* 7.6* 7.7* 7.4* 7.5* 7.8*  MG 2.3  --   --   --   --   --   --  1.7  PHOS 3.5  < > 3.4 4.0 4.2  --  3.0 3.1  < > = values in this interval not displayed. Liver Function Tests:  Recent Labs Lab 02/06/17 0808 02/06/17 1331 02/07/17 0428 02/07/17 1622 02/08/17 0936  AST  --   --  25  --  23  ALT  --   --  26  --  22  ALKPHOS  --   --  60  --  81  BILITOT  --   --   0.9  --  0.9  PROT  --   --  4.4*  --  5.2*  ALBUMIN 2.4* 2.5* 2.5* 2.8* 2.9*   No results for input(s): LIPASE, AMYLASE in the last 168 hours. No results for input(s): AMMONIA in the last 168 hours. CBC:  Recent Labs Lab 02/02/17 2210 02/03/17 0456 02/04/17 0406 02/06/17 0900 02/07/17 0428 02/08/17 0936  WBC 7.1 6.0 6.4 8.2 13.6* 15.1*  NEUTROABS 3.3  --   --   --   --  6.2  HGB 7.5* 7.3* 7.1* 7.1* 7.1* 9.1*  HCT 22.6* 22.0* 21.2* 21.6* 21.9* 27.9*  MCV 104.1* 104.8* 106.0* 107.5* 108.4* 109.4*  PLT 27* 27* 46* 48* 55* 58*   Cardiac Enzymes:  Recent Labs Lab 02/06/17 0808 02/06/17 1331  TROPONINI <0.03 <0.03   BNP: BNP (last 3 results) No results for input(s): BNP in the last 8760 hours.  ProBNP (last 3 results) No results for input(s): PROBNP in the last 8760 hours.  CBG:  Recent Labs Lab 02/07/17 0735 02/07/17 1230 02/07/17 1636 02/07/17 2139 02/08/17 0834  GLUCAP 128* 151* 179* 165* 156*       Signed:  Nita Sells MD   [facility] 02/08/2017, 12:18 PM

## 2017-02-08 NOTE — Discharge Instructions (Signed)
Supplemental Discharge Instructions for  Pacemaker/Defibrillator Patients  Activity No heavy lifting or vigorous activity with your left/right arm for 6 to 8 weeks.  Do not raise your left/right arm above your head for one week.  Gradually raise your affected arm as drawn below.             01/12/17                       01/13/17                      01/14/17                     01/15/17 __  NO DRIVING for  1 week   ; you may begin driving on  05/19/08 .  WOUND CARE - Keep the wound area clean and dry.  Do not get this area wet, no showers until cleared to at your wound check visit  . - The tape/steri-strips on your wound will fall off; do not pull them off.  No bandage is needed on the site.  DO  NOT apply any creams, oils, or ointments to the wound area. - If you notice any drainage or discharge from the wound, any swelling or bruising at the site, or you develop a fever > 101? F after you are discharged home, call the office at once.  Special Instructions - You are still able to use cellular telephones; use the ear opposite the side where you have your pacemaker/defibrillator.  Avoid carrying your cellular phone near your device. - When traveling through airports, show security personnel your identification card to avoid being screened in the metal detectors.  Ask the security personnel to use the hand wand. - Avoid arc welding equipment, MRI testing (magnetic resonance imaging), TENS units (transcutaneous nerve stimulators).  Call the office for questions about other devices. - Avoid electrical appliances that are in poor condition or are not properly grounded. - Microwave ovens are safe to be near or to operate.  Additional information for defibrillator patients should your device go off: - If your device goes off ONCE and you feel fine afterward, notify the device clinic nurses. - If your device goes off ONCE and you do not feel well afterward, call 911. - If your device goes off TWICE,  call 911. - If your device goes off THREE times in one day, call 911.  DO NOT DRIVE YOURSELF OR A FAMILY MEMBER WITH A DEFIBRILLATOR TO THE HOSPITAL--CALL 911.  Information on my medicine - ELIQUIS (apixaban)  This medication education was reviewed with me or my healthcare representative as part of my discharge preparation.  The pharmacist that spoke with me during my hospital stay was:  Georgina Peer, [facility]  Why was Eliquis prescribed for you? Eliquis was prescribed for you to reduce the risk of a blood clot forming that can cause a stroke if you have a medical condition called atrial fibrillation (a type of irregular heartbeat).  What do You need to know about Eliquis ? Take your Eliquis TWICE DAILY - one tablet in the morning and one tablet in the evening with or without food. If you have difficulty swallowing the tablet whole please discuss with your pharmacist how to take the medication safely.  Take Eliquis exactly as prescribed by your doctor and DO NOT stop taking Eliquis without talking to the doctor who prescribed the medication.  Stopping  may increase your risk of developing a stroke.  Refill your prescription before you run out.  After discharge, you should have regular check-up appointments with your healthcare provider that is prescribing your Eliquis.  In the future your dose may need to be changed if your kidney function or weight changes by a significant amount or as you get older.  What do you do if you miss a dose? If you miss a dose, take it as soon as you remember on the same day and resume taking twice daily.  Do not take more than one dose of ELIQUIS at the same time to make up a missed dose.  Important Safety Information A possible side effect of Eliquis is bleeding. You should call your healthcare provider right away if you experience any of the following: ? Bleeding from an injury or your nose that does not stop. ? Unusual colored urine (red or dark  brown) or unusual colored stools (red or black). ? Unusual bruising for unknown reasons. ? A serious fall or if you hit your head (even if there is no bleeding).  Some medicines may interact with Eliquis and might increase your risk of bleeding or clotting while on Eliquis. To help avoid this, consult your healthcare provider or pharmacist prior to using any new prescription or non-prescription medications, including herbals, vitamins, non-steroidal anti-inflammatory drugs (NSAIDs) and supplements.  This website has more information on Eliquis (apixaban): http://www.eliquis.com/eliquis/home

## 2017-02-08 NOTE — Care Management Note (Signed)
Case Management Note Original Note Created Maryclare Labrador, RN 02/01/2017, 11:23 AM  Patient Details  Name: Randall Reyes MRN: [id number] Date of Birth: November 26, 1935  Subjective/Objective:     Pt originally admitted post cardiac arrest              Action/Plan:  PTA from home with wife.  Pt is now extubated and off CRRT.  Pt will need PT eval once medically stable.  CM will continue to follow for discharge needs    Expected Discharge Date:  02/08/17               Expected Discharge Plan:  [place]  In-House Referral:     Discharge planning Services  CM Consult, Medication Assistance  Post Acute Care Choice:  Home Health Choice offered to:  Patient  DME Arranged:  N/A DME Agency:  NA  HH Arranged:  RN, PT, OT, Nurse's Aide [place] Agency:  [place]  Status of Service:  Completed, signed off  If discussed at [place] of Stay Meetings, dates discussed:  9/27  Discharge Disposition: home/home health   Additional Comments:  02/08/17- 1400- Avigayil Ton RN, CM-  Pt for d/c home today with wife- orders have been placed for HHRN/PT/OT/aide- in to speak with pt and wife at bedside- choice offered for [facility] agencies in [place]- list provided- per wife- they would like to use [facility] for services- states that pt has needed DME at home that includes walker, BSC, cane, and w/c. Have someone coming to provide transportation home. Per bedside RN pt has been started on Eliquis- provided 30 day free card to use on discharge- will attempt benefits check if can get return before pt's ride arrives will let them know what copay cost is. Referral called to Butch Penny with [facility] for [facility] services- RN/PT/OT/aide-(wife states they do not need the aide).   Dahlia Client [place]ta, RN 02/08/2017, 2:11 PM [phone]

## 2017-02-08 NOTE — Progress Notes (Signed)
Per insurance check on Eliquis # 1.  S/W TY @ [facility] # [id number]    1.ELIQUIS  5 MG BID   COVER- YES  CO-PAY- $ 45.00  TIER- 3 DRUG  PRIOR APPROVAL- NO    2 ELIQUIS  2.5 MG BID   COVER- YES  CO-PAY- $ 45.00  TIER- 3 DRUG  PRIOR APPROVAL- NO   PREFERRED PHARMACY :  [facility]  AND  [facility] ( [facility] MAIL-ORDER )

## 2017-02-08 NOTE — Progress Notes (Signed)
Physical Therapy Treatment Patient Details Name: Randall Reyes MRN: [id number] DOB: 1935/11/24 Today's Date: 02/08/2017    History of Present Illness Patient is a 81 y/o male who presents with syncopal episode at home requiring CPR and s/p cardiac arrest again at hospital. Intubated 9/18-9/20. CRRT 9/18-9/20. Admitted with tumor lysis syndrome. Possible HCAP - CXR 9/18 with RLL > LLL opacity. PMH includes CLL, HTN, DM,    PT Comments    Pt is making good progress towards his goals and is currently mod I for bed mobility, min guard for transfers and minA for steadying with ambulation of 220 feet with RW. Pt requires skilled PT to progress mobility and improve strength and endurance to safely navigate their discharge environment.    Follow Up Recommendations  Home health PT;Supervision for mobility/OOB     Equipment Recommendations  Rolling walker with 5" wheels (if he doesnt have one)    Recommendations for Other Services       Precautions / Restrictions Precautions Precautions: Fall Precaution Comments: watch BP Restrictions Weight Bearing Restrictions: No LUE Weight Bearing: Non weight bearing    Mobility  Bed Mobility Overal bed mobility: Needs Assistance Bed Mobility: Rolling;Sidelying to Sit Rolling: Modified independent (Device/Increase time) Sidelying to sit: Modified independent (Device/Increase time);HOB elevated       General bed mobility comments: Able to get to EOB without assist; increased time.   Transfers Overall transfer level: Needs assistance Equipment used: Rolling walker (2 wheeled) Transfers: Sit to/from Stand Sit to Stand: Min guard         General transfer comment: min guard for safety, sit<>stand from bed and x3 from toilet  Ambulation/Gait Ambulation/Gait assistance: Min assist Ambulation Distance (Feet): 300 Feet Assistive device: Rolling walker (2 wheeled) Gait Pattern/deviations: Step-through pattern;Decreased stride  length;Step-to pattern;Wide base of support;Decreased step length - right;Decreased step length - left Gait velocity: decreased Gait velocity interpretation: Below normal speed for age/gender General Gait Details: Slow, steady gait with RW for support; cues for keeping within RW, VSS throughout walking       Balance   Sitting-balance support: Feet supported;No upper extremity supported Sitting balance-Leahy Scale: Good     Standing balance support: During functional activity Standing balance-Leahy Scale: Fair Standing balance comment: able to stand statically for pericare with single UE suppoer to n RW                            Cognition Arousal/Alertness: Awake/alert Behavior During Therapy: WFL for tasks assessed/performed Overall Cognitive Status: Within Functional Limits for tasks assessed                                           General Comments General comments (skin integrity, edema, etc.): prior to activity BP 139/78, HR 86, RR 15, SaO2 on RA 97%O2, after activity BP 99/66, HR 88bpm, RR 23bpm, and SaO2 on RA 99%O2      Pertinent Vitals/Pain Pain Assessment: No/denies pain           PT Goals (current goals can now be found in the care plan section) Acute Rehab PT Goals PT Goal Formulation: With patient Time For Goal Achievement: 02/15/17 Potential to Achieve Goals: Fair Progress towards PT goals: Progressing toward goals    Frequency    Min 3X/week      PT Plan Current plan remains appropriate  AM-PAC PT "6 Clicks" Daily Activity  Outcome Measure  Difficulty turning over in bed (including adjusting bedclothes, sheets and blankets)?: None Difficulty moving from lying on back to sitting on the side of the bed? : None Difficulty sitting down on and standing up from a chair with arms (e.g., wheelchair, bedside commode, etc,.)?: None Help needed moving to and from a bed to chair (including a wheelchair)?: A Little Help  needed walking in hospital room?: A Little Help needed climbing 3-5 steps with a railing? : A Little 6 Click Score: 21    End of Session Equipment Utilized During Treatment: Gait belt Activity Tolerance: Patient tolerated treatment well (drop in SP02) Patient left: in chair;with call bell/phone within reach;with chair alarm set;with family/visitor present Nurse Communication: Mobility status PT Visit Diagnosis: Muscle weakness (generalized) (M62.81);Difficulty in walking, not elsewhere classified (R26.2)     Time: 3536-1443 PT Time Calculation (min) (ACUTE ONLY): 28 min  Charges:  $Gait Training: 8-22 mins $Therapeutic Activity: 8-22 mins                    G Codes:       Randall Reyes B. Migdalia Dk PT, DPT Acute Rehabilitation  [phone] Pager [phone]     Randall Reyes 02/08/2017, 11:42 AM

## 2017-02-08 NOTE — Progress Notes (Signed)
Telemetry discontinued. Discharge paperwork gone over in detail with family and patient. This RN answered all patients questions to patient satisfaction. Medications were gone over in detail. All questions answered. Port was deaccessed. Patient discharged home with family.  Lucius Conn, RN

## 2017-02-08 NOTE — Progress Notes (Addendum)
Paged MD for K level of 3.2 and HGB now 9.1. Requested to replace K and for MD to discontinue Blood transfusion orders if he has changed his mind. Transfusion orders discontinued. MD transcribed potassium order to start on discharge. Will continue to monitor. Placed case management order for patient starting Eliquis.  Lucius Conn, RN

## 2017-02-08 NOTE — Plan of Care (Signed)
Problem: Pain Managment: Goal: General experience of comfort will improve Outcome: Progressing Pt has denied any pain or discomfort during this shift.  Problem: Bowel/Gastric: Goal: Will not experience complications related to bowel motility Outcome: Progressing Received stool softener and suppository last night. Reports increased flatus, but no BM at this time.   Problem: Education: Goal: Knowledge of cardiac device and self-care will improve Outcome: Progressing Provided with pacemaker book. Pt and spouse verbalize understanding of left extremity restrictions.

## 2017-02-11 ENCOUNTER — Other Ambulatory Visit (HOSPITAL_COMMUNITY): Payer: Medicare Other

## 2017-02-11 ENCOUNTER — Ambulatory Visit (HOSPITAL_COMMUNITY): Payer: Medicare Other

## 2017-02-11 ENCOUNTER — Telehealth (HOSPITAL_COMMUNITY): Payer: Self-pay | Admitting: Emergency Medicine

## 2017-02-11 DIAGNOSIS — E883 Tumor lysis syndrome: Secondary | ICD-10-CM | POA: Diagnosis not present

## 2017-02-11 DIAGNOSIS — E119 Type 2 diabetes mellitus without complications: Secondary | ICD-10-CM | POA: Diagnosis not present

## 2017-02-11 DIAGNOSIS — R001 Bradycardia, unspecified: Secondary | ICD-10-CM | POA: Diagnosis not present

## 2017-02-11 DIAGNOSIS — Z7901 Long term (current) use of anticoagulants: Secondary | ICD-10-CM | POA: Diagnosis not present

## 2017-02-11 DIAGNOSIS — D61818 Other pancytopenia: Secondary | ICD-10-CM | POA: Diagnosis not present

## 2017-02-11 DIAGNOSIS — I110 Hypertensive heart disease with heart failure: Secondary | ICD-10-CM | POA: Diagnosis not present

## 2017-02-11 DIAGNOSIS — Z7984 Long term (current) use of oral hypoglycemic drugs: Secondary | ICD-10-CM | POA: Diagnosis not present

## 2017-02-11 DIAGNOSIS — I4892 Unspecified atrial flutter: Secondary | ICD-10-CM | POA: Diagnosis not present

## 2017-02-11 DIAGNOSIS — I509 Heart failure, unspecified: Secondary | ICD-10-CM | POA: Diagnosis not present

## 2017-02-11 DIAGNOSIS — C9110 Chronic lymphocytic leukemia of B-cell type not having achieved remission: Secondary | ICD-10-CM | POA: Diagnosis not present

## 2017-02-11 LAB — BPAM RBC
BLOOD PRODUCT EXPIRATION DATE: 201810192359
UNIT TYPE AND RH: 8400

## 2017-02-11 LAB — TYPE AND SCREEN
ABO/RH(D): AB POS
Antibody Screen: NEGATIVE
Unit division: 0

## 2017-02-11 NOTE — Telephone Encounter (Signed)
Pt has been discharged from hospital.  RN made follow up appt to see Dr Talbert Cage.  Spoke with annie and told her appt was 02/19/2017 at 1:40 pm for lab work and to see the doctor at 2:20 pm.  She verbalized understanding.

## 2017-02-12 DIAGNOSIS — R001 Bradycardia, unspecified: Secondary | ICD-10-CM | POA: Diagnosis not present

## 2017-02-12 DIAGNOSIS — I4892 Unspecified atrial flutter: Secondary | ICD-10-CM | POA: Diagnosis not present

## 2017-02-12 DIAGNOSIS — I509 Heart failure, unspecified: Secondary | ICD-10-CM | POA: Diagnosis not present

## 2017-02-12 DIAGNOSIS — D61818 Other pancytopenia: Secondary | ICD-10-CM | POA: Diagnosis not present

## 2017-02-12 DIAGNOSIS — C9110 Chronic lymphocytic leukemia of B-cell type not having achieved remission: Secondary | ICD-10-CM | POA: Diagnosis not present

## 2017-02-12 DIAGNOSIS — Z7984 Long term (current) use of oral hypoglycemic drugs: Secondary | ICD-10-CM | POA: Diagnosis not present

## 2017-02-12 DIAGNOSIS — E883 Tumor lysis syndrome: Secondary | ICD-10-CM | POA: Diagnosis not present

## 2017-02-12 DIAGNOSIS — Z7901 Long term (current) use of anticoagulants: Secondary | ICD-10-CM | POA: Diagnosis not present

## 2017-02-12 DIAGNOSIS — E119 Type 2 diabetes mellitus without complications: Secondary | ICD-10-CM | POA: Diagnosis not present

## 2017-02-12 DIAGNOSIS — I110 Hypertensive heart disease with heart failure: Secondary | ICD-10-CM | POA: Diagnosis not present

## 2017-02-13 ENCOUNTER — Other Ambulatory Visit (HOSPITAL_COMMUNITY): Payer: Medicare Other

## 2017-02-13 DIAGNOSIS — Z7901 Long term (current) use of anticoagulants: Secondary | ICD-10-CM | POA: Diagnosis not present

## 2017-02-13 DIAGNOSIS — I509 Heart failure, unspecified: Secondary | ICD-10-CM | POA: Diagnosis not present

## 2017-02-13 DIAGNOSIS — R001 Bradycardia, unspecified: Secondary | ICD-10-CM | POA: Diagnosis not present

## 2017-02-13 DIAGNOSIS — E119 Type 2 diabetes mellitus without complications: Secondary | ICD-10-CM | POA: Diagnosis not present

## 2017-02-13 DIAGNOSIS — I110 Hypertensive heart disease with heart failure: Secondary | ICD-10-CM | POA: Diagnosis not present

## 2017-02-13 DIAGNOSIS — D61818 Other pancytopenia: Secondary | ICD-10-CM | POA: Diagnosis not present

## 2017-02-13 DIAGNOSIS — E883 Tumor lysis syndrome: Secondary | ICD-10-CM | POA: Diagnosis not present

## 2017-02-13 DIAGNOSIS — Z7984 Long term (current) use of oral hypoglycemic drugs: Secondary | ICD-10-CM | POA: Diagnosis not present

## 2017-02-13 DIAGNOSIS — C9110 Chronic lymphocytic leukemia of B-cell type not having achieved remission: Secondary | ICD-10-CM | POA: Diagnosis not present

## 2017-02-13 DIAGNOSIS — I4892 Unspecified atrial flutter: Secondary | ICD-10-CM | POA: Diagnosis not present

## 2017-02-14 DIAGNOSIS — R001 Bradycardia, unspecified: Secondary | ICD-10-CM | POA: Diagnosis not present

## 2017-02-14 DIAGNOSIS — E883 Tumor lysis syndrome: Secondary | ICD-10-CM | POA: Diagnosis not present

## 2017-02-14 DIAGNOSIS — I509 Heart failure, unspecified: Secondary | ICD-10-CM | POA: Diagnosis not present

## 2017-02-14 DIAGNOSIS — E119 Type 2 diabetes mellitus without complications: Secondary | ICD-10-CM | POA: Diagnosis not present

## 2017-02-14 DIAGNOSIS — I110 Hypertensive heart disease with heart failure: Secondary | ICD-10-CM | POA: Diagnosis not present

## 2017-02-14 DIAGNOSIS — D61818 Other pancytopenia: Secondary | ICD-10-CM | POA: Diagnosis not present

## 2017-02-14 DIAGNOSIS — C9110 Chronic lymphocytic leukemia of B-cell type not having achieved remission: Secondary | ICD-10-CM | POA: Diagnosis not present

## 2017-02-14 DIAGNOSIS — Z7984 Long term (current) use of oral hypoglycemic drugs: Secondary | ICD-10-CM | POA: Diagnosis not present

## 2017-02-14 DIAGNOSIS — Z7901 Long term (current) use of anticoagulants: Secondary | ICD-10-CM | POA: Diagnosis not present

## 2017-02-14 DIAGNOSIS — I4892 Unspecified atrial flutter: Secondary | ICD-10-CM | POA: Diagnosis not present

## 2017-02-15 ENCOUNTER — Other Ambulatory Visit (HOSPITAL_COMMUNITY): Payer: Medicare Other

## 2017-02-18 ENCOUNTER — Other Ambulatory Visit (HOSPITAL_COMMUNITY): Payer: Self-pay | Admitting: *Deleted

## 2017-02-18 ENCOUNTER — Other Ambulatory Visit (HOSPITAL_COMMUNITY): Payer: Medicare Other

## 2017-02-18 DIAGNOSIS — I4892 Unspecified atrial flutter: Secondary | ICD-10-CM | POA: Diagnosis not present

## 2017-02-18 DIAGNOSIS — I509 Heart failure, unspecified: Secondary | ICD-10-CM | POA: Diagnosis not present

## 2017-02-18 DIAGNOSIS — D61818 Other pancytopenia: Secondary | ICD-10-CM | POA: Diagnosis not present

## 2017-02-18 DIAGNOSIS — Z7984 Long term (current) use of oral hypoglycemic drugs: Secondary | ICD-10-CM | POA: Diagnosis not present

## 2017-02-18 DIAGNOSIS — I110 Hypertensive heart disease with heart failure: Secondary | ICD-10-CM | POA: Diagnosis not present

## 2017-02-18 DIAGNOSIS — E883 Tumor lysis syndrome: Secondary | ICD-10-CM | POA: Diagnosis not present

## 2017-02-18 DIAGNOSIS — E119 Type 2 diabetes mellitus without complications: Secondary | ICD-10-CM | POA: Diagnosis not present

## 2017-02-18 DIAGNOSIS — C9110 Chronic lymphocytic leukemia of B-cell type not having achieved remission: Secondary | ICD-10-CM | POA: Diagnosis not present

## 2017-02-18 DIAGNOSIS — Z7901 Long term (current) use of anticoagulants: Secondary | ICD-10-CM | POA: Diagnosis not present

## 2017-02-18 DIAGNOSIS — R001 Bradycardia, unspecified: Secondary | ICD-10-CM | POA: Diagnosis not present

## 2017-02-19 ENCOUNTER — Encounter (HOSPITAL_COMMUNITY): Payer: Medicare Other | Attending: Oncology | Admitting: Oncology

## 2017-02-19 ENCOUNTER — Encounter (HOSPITAL_COMMUNITY): Payer: Medicare Other

## 2017-02-19 ENCOUNTER — Encounter (HOSPITAL_COMMUNITY): Payer: Self-pay | Admitting: Oncology

## 2017-02-19 VITALS — BP 118/69 | HR 86 | Temp 97.8°F | Resp 16 | Wt 139.0 lb

## 2017-02-19 DIAGNOSIS — I455 Other specified heart block: Secondary | ICD-10-CM | POA: Diagnosis not present

## 2017-02-19 DIAGNOSIS — E883 Tumor lysis syndrome: Secondary | ICD-10-CM

## 2017-02-19 DIAGNOSIS — Z23 Encounter for immunization: Secondary | ICD-10-CM | POA: Diagnosis not present

## 2017-02-19 DIAGNOSIS — I469 Cardiac arrest, cause unspecified: Secondary | ICD-10-CM | POA: Diagnosis not present

## 2017-02-19 DIAGNOSIS — I498 Other specified cardiac arrhythmias: Secondary | ICD-10-CM | POA: Diagnosis not present

## 2017-02-19 DIAGNOSIS — C9110 Chronic lymphocytic leukemia of B-cell type not having achieved remission: Secondary | ICD-10-CM | POA: Diagnosis not present

## 2017-02-19 DIAGNOSIS — D630 Anemia in neoplastic disease: Secondary | ICD-10-CM

## 2017-02-19 DIAGNOSIS — D6959 Other secondary thrombocytopenia: Secondary | ICD-10-CM

## 2017-02-19 DIAGNOSIS — I4892 Unspecified atrial flutter: Secondary | ICD-10-CM | POA: Diagnosis not present

## 2017-02-19 DIAGNOSIS — C9190 Lymphoid leukemia, unspecified not having achieved remission: Secondary | ICD-10-CM | POA: Insufficient documentation

## 2017-02-19 LAB — COMPREHENSIVE METABOLIC PANEL
ALT: 16 U/L — AB (ref 17–63)
AST: 21 U/L (ref 15–41)
Albumin: 3.4 g/dL — ABNORMAL LOW (ref 3.5–5.0)
Alkaline Phosphatase: 111 U/L (ref 38–126)
Anion gap: 7 (ref 5–15)
BILIRUBIN TOTAL: 0.6 mg/dL (ref 0.3–1.2)
BUN: 11 mg/dL (ref 6–20)
CHLORIDE: 103 mmol/L (ref 101–111)
CO2: 29 mmol/L (ref 22–32)
CREATININE: 1.71 mg/dL — AB (ref 0.61–1.24)
Calcium: 8.1 mg/dL — ABNORMAL LOW (ref 8.9–10.3)
GFR calc Af Amer: 42 mL/min — ABNORMAL LOW (ref >60)
GFR, EST NON AFRICAN AMERICAN: 36 mL/min — AB (ref >60)
Glucose, Bld: 149 mg/dL — ABNORMAL HIGH (ref 65–99)
Potassium: 4.1 mmol/L (ref 3.5–5.1)
Sodium: 139 mmol/L (ref 135–145)
Total Protein: 5.9 g/dL — ABNORMAL LOW (ref 6.5–8.1)

## 2017-02-19 LAB — CBC WITH DIFFERENTIAL/PLATELET
BASOS ABS: 0 10*3/uL (ref 0.0–0.1)
BASOS PCT: 0 %
EOS ABS: 0.2 10*3/uL (ref 0.0–0.7)
Eosinophils Relative: 2 %
HCT: 22.7 % — ABNORMAL LOW (ref 39.0–52.0)
Hemoglobin: 7.3 g/dL — ABNORMAL LOW (ref 13.0–17.0)
LYMPHS PCT: 78 %
Lymphs Abs: 6 10*3/uL — ABNORMAL HIGH (ref 0.7–4.0)
MCH: 37.6 pg — ABNORMAL HIGH (ref 26.0–34.0)
MCHC: 32.2 g/dL (ref 30.0–36.0)
MCV: 117 fL — AB (ref 78.0–100.0)
Monocytes Absolute: 0.8 10*3/uL (ref 0.1–1.0)
Monocytes Relative: 10 %
NEUTROS PCT: 10 %
Neutro Abs: 0.8 10*3/uL — ABNORMAL LOW (ref 1.7–7.7)
PLATELETS: 56 10*3/uL — AB (ref 150–400)
RBC: 1.94 MIL/uL — AB (ref 4.22–5.81)
RDW: 18.8 % — ABNORMAL HIGH (ref 11.5–15.5)
WBC: 7.8 10*3/uL (ref 4.0–10.5)

## 2017-02-19 LAB — PREPARE RBC (CROSSMATCH)

## 2017-02-19 MED ORDER — INFLUENZA VAC SPLIT QUAD 0.5 ML IM SUSY
0.5000 mL | PREFILLED_SYRINGE | Freq: Once | INTRAMUSCULAR | Status: AC
  Administered 2017-02-19: 0.5 mL via INTRAMUSCULAR
  Filled 2017-02-19: qty 0.5

## 2017-02-19 NOTE — Progress Notes (Signed)
Randall Reyes presents today for injection per MD orders. Fluarix administered IM in left deltoid. Administration without incident. Patient tolerated well. Patient tolerated treatment without incidence. Patient discharged ambulatory and in stable condition from clinic. Patient to follow up as scheduled.

## 2017-02-19 NOTE — Patient Instructions (Signed)
[place] at [facility] Discharge Instructions  RECOMMENDATIONS MADE BY THE CONSULTANT AND ANY TEST RESULTS WILL BE SENT TO YOUR REFERRING PHYSICIAN.  You were seen today by Dr. Twana First  STOP TAKING THE FOLLOWING:  [place] - nurse at cancer center discarded today Sulfamethoxazole-triemthoprim (Bactrim DS) Acyclovir Allopurinol Cod liver oil  When you finish taking the potassium you do not need refills  You will need to go back to the lab for type and cross match today We are going to get you set up for 2 units of blood tomorrow Follow up in 2 months with labs   Thank you for choosing [place] at [facility] to provide your oncology and hematology care.  To afford each patient quality time with our provider, please arrive at least 15 minutes before your scheduled appointment time.    If you have a lab appointment with the [place] please come in thru the  Main Entrance and check in at the main information desk  You need to re-schedule your appointment should you arrive 10 or more minutes late.  We strive to give you quality time with our providers, and arriving late affects you and other patients whose appointments are after yours.  Also, if you no show three or more times for appointments you may be dismissed from the clinic at the providers discretion.     Again, thank you for choosing [facility].  Our hope is that these requests will decrease the amount of time that you wait before being seen by our physicians.       _____________________________________________________________  Should you have questions after your visit to [facility], please contact our office at [phone] between the hours of 8:30 a.m. and 4:30 p.m.  Voicemails left after 4:30 p.m. will not be returned until the following business day.  For prescription refill requests, have your pharmacy contact our office.        Resources For Cancer Patients and their Caregivers ? American Cancer Society: Can assist with transportation, wigs, general needs, runs Look Good Feel Better.        507-549-4042 ? Cancer Care: Provides financial assistance, online support groups, medication/co-pay assistance.  1-800-813-HOPE (952)834-8194) ? [place] Assists [place] cancer patients and their families through emotional , educational and financial support.  680-083-4466 ? [place] DSS Where to apply for food stamps, Medicaid and utility assistance. (214)364-2068 ? [facility]: Transportation to medical appointments. 640-261-2834 ? Social Security Administration: May apply for disability if have a Stage IV cancer. 208-039-4801 (316)401-3055 ? [employer], Disability and Transit Services: Assists with nutrition, care and transit needs. [place] Support Programs: @10RELATIVEDAYS @ > Cancer Support Group  2nd Tuesday of the month 1pm-2pm, Journey Room  > Creative Journey  3rd Tuesday of the month 1130am-1pm, Journey Room  > Look Good Feel Better  1st Wednesday of the month 10am-12 noon, Journey Room (Call [place] to register [phone])

## 2017-02-19 NOTE — Progress Notes (Signed)
[facility] Initial Visit:  Patient Care Team: [name], MD as PCP - General (Internal Medicine) Gala Romney, Cristopher Estimable, MD as Consulting Physician (Gastroenterology) Fran Lowes, MD as Consulting Physician (Nephrology)  CHIEF COMPLAINTS/PURPOSE OF CONSULTATION:    CLL (chronic lymphocytic leukemia) ([place])   02/14/2005 Procedure    Peripheral flow cytometry- CLL (without 11q or 17 p deletion) presenting with stage 0 disease      10/25/2008 Progression    Increasing WBC and symptoms suggestive of progressive disease      11/09/2008 - 01/07/2009 Chemotherapy    Rituxan, fludarabine, cytoxan x 3 cycles with the discontinuation of fludarabine and cytoxan due to severe pancytopenia requiring hospitalization.      01/10/2009 Adverse Reaction    Severe pancytopenia and adverse side effects      02/01/2009 - 04/01/2009 Chemotherapy    Rituxan every 21 days x 3 cycles      04/02/2009 Remission    Stable disease.  Chemotherapy holiday started      06/22/2013 - 06/29/2013 Chemotherapy    Imbruvica daily      06/29/2013 Adverse Reaction    Large right posterior leg (popliteal) ecchymosis/violaceous rash.  Likely Imbruvica-induced      07/31/2013 - 10/12/2013 Chemotherapy    Zydelig (idelalisib) 150 mg BID monotherapy      10/12/2013 Adverse Reaction    Transaminitis, suspected to be secondary to [place].  Dose decreased.      10/12/2013 - 11/09/2013 Chemotherapy    Zydelig (idelalisib) 150 mg daily.      11/09/2013 Adverse Reaction    Worsening transaminitis.  Zydelig on HOLD      11/09/2013 Treatment Plan Change    Hold Zydelig      12/28/2013 Adverse Reaction    Herpes Zoster outbreak.  Partial response with Famvir x 1 week, switched to acyclovir.  Progression over 1-2 weeks with development of keratoconjunctivitis bilaterally.  VZ Immune Globulin 625 mg IM given on 01/13/14.      02/08/2014 Treatment Plan Change    Ibrutinib restarted 140 mg daily for one  week followed by 280 mg daily followed by 420 mg daily with prophylactic acyclovir.      02/08/2014 - 07/05/2014 Chemotherapy    Ibrutinib 420 mg daily      110/16/202015 - 04/17/2014 Hospital Admission    Right lower lobe pneumonia      07/05/2014 Adverse Reaction    Fissuring of fingertips and sore, red feet with transaminitis      07/05/2014 Treatment Plan Change    Hold Ibrutinib      07/22/2014 - 08/19/2014 Chemotherapy    Ibrutinib restarted at 280 mg daily.      08/09/2014 - 08/12/2014 Hospital Admission    Bilateral pneumonia      08/19/2014 Adverse Reaction    Nausea with vomiting.  Escorted to ED.      08/19/2014 Treatment Plan Change    Ibrutinib on hold- ED escort from clinic       10/18/2014 Miscellaneous    B12 weekly x 4 then monthly.      12/15/2015 Imaging    Bone density- BMD as determined from Femur Neck Left is 0.803 g/cm2 with a T-score of -2.1. This patient is considered osteopenic by World Healh Organization (WHO) Criteria.      12/16/2015 Imaging    CT abd/pelvis-  Progression of retroperitoneal and portacaval adenopathy compared to the prior study. Likely related to CLL.  Small hiatal hernia  Mild compression  fractures T11 and T12, probably old. Correlate with any pain in this area.      05/22/2016 - 05/28/2016 Chemotherapy    BR day 1 and 2 x 1 cycle.  Bendamustine dose reduced by 50%.      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      01/28/2017 -  Chemotherapy    Cycle 1 day 1 of Gayzva and Chlorambucil. Rasburicase 3 mg given also.       01/28/2017 - 02/08/2017 Hospital Admission    Admitted for sycopal episode due to complete heart block. Patient went into PEA arrest requiring CPR and intubation. 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 Pacemaker placed while inpatient. Started on eliquis.       HISTORY OF PRESENTING ILLNESS: Randall Reyes 81 y.o. male presents today for continued follow-up with his wife and son. He received cycle 1 day 1 of Gayzva and Chlorambucil on 01/28/17. Rasburicase 20m IV was given as well. Patient had tolerated treatment well and had been discharged from our clinic. [facility] and requiring CPR. While patient was in the ED he had another episode of syncope and was found to be pulseless. He was given another round of CPR [facility]. His WBC on 01/29/2017 was 124.7k but dropped down to 21.3K on 01/30/2017 after he received cycle 1 day 1 of treatment. Patient was intubated for acute respiratory failure with cardiac arrest and transferred to the ICU. A syncopal episode was due to complete heart block. He also experienced a flutter with tachybradycardia with the Chads 2 score of 4. He had a pacemaker placed while he was inpatient and was placed on Eliquis 2.5 mg by mouth twice a day. 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 Patient was discharged from the hospital on 02/08/2017. On the day of discharge WBC was 15.1 K, hemoglobin 9.1 g/dL, hematocrit 27.9, MCV 109.4, platelet count 58K.  Today patient presents with his wife in son for follow-up. He states he's been doing fairly well since he's been discharged from the hospital. He denies any chest pain, dizziness, shortness breath, abdominal pain. His wife states his appetite is good. He does state he feels fatigued. CBC performed today demonstrated WBC 7.8K, hemoglobin 0.3 g/dL, hematocrit 22.7%, platelet count 50 6K.  Review of Systems - Oncology ROS as per HPI otherwise 12 point ROS is negative.  MEDICAL HISTORY: Past  Medical History:  Diagnosis Date  . Anemia   . B12 deficiency 12/07/2014  . CLL (chronic lymphocytic leukemia) ([place] 01/24/2011  . Diabetes mellitus   . DM (diabetes mellitus) ([place] 01/24/2011  . Eczema 01/24/2011  . History of pneumonia 04/2014  . Hypertension   . Leukemia ([place] 6.22.2012   PER PATIENT  . Melanoma in situ ([place] 01/24/2011  . Mild obesity 01/24/2011  . Port catheter in place 05/02/2012  . Shingles   . Ulcer     SURGICAL HISTORY: Past Surgical History:  Procedure Laterality Date  . bleeding ulcer    . CATARACT EXTRACTION W/PHACO  05/05/2012   Procedure: CATARACT EXTRACTION PHACO AND INTRAOCULAR LENS PLACEMENT (IOC);  Surgeon: KTonny Branch MD;  Location: [facility] ORS;  Service: Ophthalmology;  Laterality: Right;  CDE:13.25  . CATARACT EXTRACTION W/PHACO  05/15/2012   Procedure: CATARACT EXTRACTION PHACO AND INTRAOCULAR LENS PLACEMENT (IOC);  Surgeon: Tonny Branch, MD;  Location: [facility] ORS;  Service: Ophthalmology;  Laterality: Left;  CDE:  12.32  . COLONOSCOPY N/A 01/30/2016   Procedure: COLONOSCOPY;  Surgeon: Danie Binder, MD;  Location: [facility] ENDO SUITE;  Service: Endoscopy;  Laterality: N/A;  2:00 PM - moved to 1:00 - office notified pt  . ESOPHAGOGASTRODUODENOSCOPY  2008   Dr. Oneida Alar: normal esophagus, antral erythema, 1 cm clean based duodenal ulcera, negative H. plyori  . ESOPHAGOGASTRODUODENOSCOPY N/A 01/30/2016   Procedure: ESOPHAGOGASTRODUODENOSCOPY (EGD);  Surgeon: Danie Binder, MD;  Location: [facility];  Service: Endoscopy;  Laterality: N/A;  . HERNIA REPAIR  2001  . PACEMAKER IMPLANT N/A 02/07/2017   Procedure: PACEMAKER IMPLANT;  Surgeon: Constance Haw, MD;  Location: [facility];  Service: Cardiovascular;  Laterality: N/A;  . PORTACATH PLACEMENT  2008    SOCIAL HISTORY: Social History   Social History  . Marital status: Married    Spouse name: N/A  . Number of children: N/A  . Years of education: N/A   Occupational History  . Not on file.    Social History Main Topics  . Smoking status: Former Research scientist (life sciences)  . Smokeless tobacco: Former Systems developer    Quit date: 04/25/1967  . Alcohol use No  . Drug use: No  . Sexual activity: Not on file   Other Topics Concern  . Not on file   Social History Narrative  . No narrative on file    FAMILY HISTORY Family History  Problem Relation Age of Onset  . Diabetes Father   . Colon cancer Neg Hx     ALLERGIES:  is allergic to aspirin and ibuprofen.  MEDICATIONS:  Current Outpatient Prescriptions  Medication Sig Dispense Refill  . acetaminophen (TYLENOL) 500 MG tablet Take 500 mg by mouth at bedtime as needed for mild pain or moderate pain.     [name] apixaban (ELIQUIS) 2.5 MG TABS tablet Take 1 tablet (2.5 mg total) by mouth 2 (two) times daily. 60 tablet 0  . Ascorbic Acid (VITAMIN C PO) Take 1 tablet by mouth daily.    [name] atovaquone (MEPRON) 750 MG/5ML suspension Take 10 mLs (1,500 mg total) by mouth daily with breakfast. 210 mL 0  . cholecalciferol (VITAMIN D) 400 units TABS tablet Take 400 Units by mouth daily.    Mariane Baumgarten Calcium (STOOL SOFTENER PO) Take 1 capsule by mouth as needed.    . levETIRAcetam (KEPPRA) 100 MG/ML solution Take 7.5 mLs (750 mg total) by mouth 2 (two) times daily. 473 mL 12  . lidocaine-prilocaine (EMLA) cream Apply a quarter size amount to affected area 1 hour prior to coming to chemotherapy.  Do not rub in.  Cover with plastic wrap. 30 g 2  . potassium chloride SA (K-DUR,KLOR-CON) 20 MEQ tablet Take 2 tablets (40 mEq total) by mouth 2 (two) times daily. 14 tablet 0  . rosuvastatin (CRESTOR) 40 MG tablet Take 40 mg by mouth at bedtime.     . sevelamer carbonate (RENVELA) 800 MG tablet Take 2 tablets (1,600 mg total) by mouth 3 (three) times daily with meals. (Patient taking differently: Take 800 mg by mouth 3 (three) times daily with meals. ) 180 tablet 2  . sitaGLIPtin (JANUVIA) 100 MG tablet Take 100 mg by mouth daily.    . sodium bicarbonate  650 MG tablet TAKE 1  TABLET BY MOUTH TWICE A DAY AS DIRECTED  3  . ondansetron (ZOFRAN) 8 MG tablet Take 1 tablet (8 mg total) by mouth every 8 (eight) hours as needed for nausea or vomiting. (Patient not taking: Reported on 02/19/2017) 30 tablet 2  . prochlorperazine (COMPAZINE) 10 MG tablet Take 1 tablet (10 mg total) by mouth every 6 (six) hours as needed for nausea or vomiting. (Patient not taking: Reported on 02/19/2017) 30 tablet 2   No current facility-administered medications for this visit.    Facility-Administered Medications Ordered in Other Visits  Medication Dose Route Frequency Provider Last Rate Last Dose  . heparin lock flush 100 unit/mL  500 Units Intravenous Once Holley Bouche, NP      . sodium chloride flush (NS) 0.9 % injection 10 mL  10 mL Intravenous PRN Holley Bouche, NP        PHYSICAL EXAMINATION:  ECOG PERFORMANCE STATUS: 2 - Symptomatic, <50% confined to bed   Vitals:   02/19/17 1428  BP: 118/69  Pulse: 86  Resp: 16  Temp: 97.8 F (36.6 C)  SpO2: 100%    Filed Weights   02/19/17 1428  Weight: 139 lb (63 kg)     Physical Exam Constitutional: Well-developed, well-nourished, and in no distress.   HENT:  Head: Normocephalic and atraumatic.  Mouth/Throat: No oropharyngeal exudate. Mucosa moist. Eyes: Pupils are equal, round, and reactive to light. Conjunctivae are normal. No scleral icterus.  Neck: Normal range of motion. Neck supple. No JVD present.  Cardiovascular: Normal rate, regular rhythm and normal heart sounds.  Exam reveals no gallop and no friction rub.   No murmur heard. Pulmonary/Chest: Effort normal and breath sounds normal. No respiratory distress. No wheezes.No rales.  Abdominal: Soft. Bowel sounds are normal. No distension. There is no tenderness. There is no guarding.  Musculoskeletal: No edema or tenderness.  Lymphadenopathy:    No cervical or supraclavicular adenopathy.  Neurological: Alert and oriented to person, place, and time. No cranial  nerve deficit.  Skin: Skin is warm and dry. No rash noted. No erythema. No pallor.  Psychiatric: Affect and judgment normal.    LABORATORY DATA: I have personally reviewed the data as listed:  Appointment on 02/19/2017  Component Date Value Ref Range Status  . WBC 02/19/2017 7.8  4.0 - 10.5 K/uL Final  . RBC 02/19/2017 1.94* 4.22 - 5.81 MIL/uL Final  . Hemoglobin 02/19/2017 7.3* 13.0 - 17.0 g/dL Final  . HCT 02/19/2017 22.7* 39.0 - 52.0 % Final  . MCV 02/19/2017 117.0* 78.0 - 100.0 fL Final  . MCH 02/19/2017 37.6* 26.0 - 34.0 pg Final  . MCHC 02/19/2017 32.2  30.0 - 36.0 g/dL Final  . RDW 02/19/2017 18.8* 11.5 - 15.5 % Final  . Platelets 02/19/2017 56* 150 - 400 K/uL Final   Comment: PLATELET COUNT CONFIRMED BY SMEAR SPECIMEN CHECKED FOR CLOTS   . Neutrophils Relative % 02/19/2017 10  % Final  . Lymphocytes Relative 02/19/2017 78  % Final  . Monocytes Relative 02/19/2017 10  % Final  . Eosinophils Relative 02/19/2017 2  % Final  . Basophils Relative 02/19/2017 0  % Final  . Neutro Abs 02/19/2017 0.8* 1.7 - 7.7 K/uL Final  . Lymphs Abs 02/19/2017 6.0* 0.7 - 4.0 K/uL Final  . Monocytes Absolute 02/19/2017 0.8  0.1 - 1.0 K/uL Final  . Eosinophils Absolute 02/19/2017 0.2  0.0 - 0.7 K/uL Final  . Basophils Absolute 02/19/2017 0.0  0.0 -  0.1 K/uL Final  . RBC Morphology 02/19/2017 MACROCYTES SEEN   Final  . WBC Morphology 02/19/2017 ABSOLUTE LYMPHOCYTOSIS   Final   Comment: ATYPICAL LYMPHOCYTES SMUDGE CELLS   . Sodium 02/19/2017 139  135 - 145 mmol/L Final  . Potassium 02/19/2017 4.1  3.5 - 5.1 mmol/L Final  . Chloride 02/19/2017 103  101 - 111 mmol/L Final  . CO2 02/19/2017 29  22 - 32 mmol/L Final  . Glucose, Bld 02/19/2017 149* 65 - 99 mg/dL Final  . BUN 02/19/2017 11  6 - 20 mg/dL Final  . Creatinine, Ser 02/19/2017 1.71* 0.61 - 1.24 mg/dL Final  . Calcium 02/19/2017 8.1* 8.9 - 10.3 mg/dL Final  . Total Protein 02/19/2017 5.9* 6.5 - 8.1 g/dL Final  . Albumin 02/19/2017  3.4* 3.5 - 5.0 g/dL Final  . AST 02/19/2017 21  15 - 41 U/L Final  . ALT 02/19/2017 16* 17 - 63 U/L Final  . Alkaline Phosphatase 02/19/2017 111  38 - 126 U/L Final  . Total Bilirubin 02/19/2017 0.6  0.3 - 1.2 mg/dL Final  . GFR calc non Af Amer 02/19/2017 36* >60 mL/min Final  . GFR calc Af Amer 02/19/2017 42* >60 mL/min Final   Comment: (NOTE) The eGFR has been calculated using the CKD EPI equation. This calculation has not been validated in all clinical situations. eGFR's persistently <60 mL/min signify possible Chronic Kidney Disease.   . Anion gap 02/19/2017 7  5 - 15 Final  Admission on 01/28/2017, Discharged on 02/08/2017  No results displayed because visit has over 200 results.    Lab on 01/28/2017  Component Date Value Ref Range Status  . Uric Acid, Serum 01/28/2017 2.5* 4.4 - 7.6 mg/dL Final  . WBC 01/28/2017 227.7* 4.0 - 10.5 K/uL Final   Comment: REPEATED TO VERIFY CRITICAL RESULT CALLED TO, READ BACK BY AND VERIFIED WITH: MICHELLE BARNES LPN AT 4166 BY HFLYNT 01/28/17   . RBC 01/28/2017 2.34* 4.22 - 5.81 MIL/uL Final  . Hemoglobin 01/28/2017 8.8* 13.0 - 17.0 g/dL Final  . HCT 01/28/2017 27.3* 39.0 - 52.0 % Final  . MCV 01/28/2017 116.7* 78.0 - 100.0 fL Final  . MCH 01/28/2017 37.6* 26.0 - 34.0 pg Final  . MCHC 01/28/2017 32.2  30.0 - 36.0 g/dL Final  . RDW 01/28/2017 15.7* 11.5 - 15.5 % Final  . Platelets 01/28/2017 123* 150 - 400 K/uL Final  . Neutrophils Relative % 01/28/2017 2  % Final  . Lymphocytes Relative 01/28/2017 98  % Final  . Monocytes Relative 01/28/2017 0  % Final  . Eosinophils Relative 01/28/2017 0  % Final  . Basophils Relative 01/28/2017 0  % Final  . Band Neutrophils 01/28/2017 0  % Final  . Metamyelocytes Relative 01/28/2017 0  % Final  . Myelocytes 01/28/2017 0  % Final  . Promyelocytes Absolute 01/28/2017 0  % Final  . Blasts 01/28/2017 0  % Final  . nRBC 01/28/2017 0  0 /100 WBC Final  . Other 01/28/2017 0  % Corrected  . Neutro Abs  01/28/2017 4.6  1.7 - 7.7 K/uL Final  . Lymphs Abs 01/28/2017 223.1* 0.7 - 4.0 K/uL Final  . Monocytes Absolute 01/28/2017 0.0* 0.1 - 1.0 K/uL Final  . Eosinophils Absolute 01/28/2017 0.0  0.0 - 0.7 K/uL Final  . Basophils Absolute 01/28/2017 0.0  0.0 - 0.1 K/uL Final  . RBC Morphology 01/28/2017 MACROCYTES   Final  . WBC Morphology 01/28/2017 ABSOLUTE LYMPHOCYTOSIS   Final   Comment: ATYPICAL  LYMPHOCYTES SMUDGE CELLS WHITE COUNT CONFIRMED ON SMEAR   . Sodium 01/28/2017 137  135 - 145 mmol/L Final  . Potassium 01/28/2017 4.8  3.5 - 5.1 mmol/L Final  . Chloride 01/28/2017 102  101 - 111 mmol/L Final  . CO2 01/28/2017 26  22 - 32 mmol/L Final  . Glucose, Bld 01/28/2017 154* 65 - 99 mg/dL Final  . BUN 01/28/2017 31* 6 - 20 mg/dL Final  . Creatinine, Ser 01/28/2017 2.66* 0.61 - 1.24 mg/dL Final  . Calcium 01/28/2017 8.6* 8.9 - 10.3 mg/dL Final  . Total Protein 01/28/2017 7.0  6.5 - 8.1 g/dL Final  . Albumin 01/28/2017 3.8  3.5 - 5.0 g/dL Final  . AST 01/28/2017 21  15 - 41 U/L Final  . ALT 01/28/2017 12* 17 - 63 U/L Final  . Alkaline Phosphatase 01/28/2017 112  38 - 126 U/L Final  . Total Bilirubin 01/28/2017 0.5  0.3 - 1.2 mg/dL Final  . GFR calc non Af Amer 01/28/2017 21* >60 mL/min Final  . GFR calc Af Amer 01/28/2017 25* >60 mL/min Final   Comment: (NOTE) The eGFR has been calculated using the CKD EPI equation. This calculation has not been validated in all clinical situations. eGFR's persistently <60 mL/min signify possible Chronic Kidney Disease.   . Anion gap 01/28/2017 9  5 - 15 Final  . LDH 01/28/2017 223* 98 - 192 U/L Final  . Phosphorus 01/28/2017 3.3  2.5 - 4.6 mg/dL Final  . Magnesium 01/28/2017 2.2  1.7 - 2.4 mg/dL Final    RADIOGRAPHIC STUDIES: I have personally reviewed the radiological images as listed and agree with the findings in the report  No results found.  ASSESSMENT/PLAN CLL Anemia and thrombocytopenia due to underlying CLL Tumor lysis syndrome PEA  arrest due to complete heart block A.Flutter/tachybradyarryhthmia  PLAN: Patient's leukocytosis from CLL responded very well to gayzva and chlorambucil. WBC is 7.8k today. I have told him that given how sick he got after receiving this treatment, I do not think it would be beneficial for him to receive any further treatment for his CLL. Patient is very sensitive to chemotherapy treatments and has been hospitalized every time he's received a treatment. He and his family are in agreement that we will not give the patient any further treatment for his CLL and treat him symptomatically with blood transfusions as needed. -Hemoglobin is 7.3 g/dl today. Discussed pRBC transfusion with him and he is willing to proceed. Will plan to give him 2 units pRBC tranfusion this week. -Platelets stable in 50k. He has not had any bleeding complications. -Discussed eliquis for stroke prevention and how it increases his bleeding risk since he is thrombocytopenic as well. -Advised him to follow up with his cardiologist, his wife stated she will make him an appointment today. -RTC in 2 months with labs.   Orders Placed This Encounter  Procedures  . CBC with Differential    Standing Status:   Future    Standing Expiration Date:   02/19/2018  . Comprehensive metabolic panel    Standing Status:   Future    Standing Expiration Date:   02/19/2018  . Practitioner attestation of consent    I, the ordering practitioner, attest that I have discussed with the patient the benefits, risks, side effects, alternatives, likelihood of achieving goals and potential problems during recovery for the procedure listed.    Standing Status:   Future    Number of Occurrences:   1    Standing Expiration  Date:   02/19/2018    Order Specific Question:   Procedure    Answer:   Blood Product(s)  . Complete patient signature process for consent form    Standing Status:   Future    Number of Occurrences:   1    Standing Expiration Date:    02/19/2018  . Care order/instruction    Transfuse Parameters    Standing Status:   Future    Number of Occurrences:   1    Standing Expiration Date:   02/19/2018  . Type and screen    Standing Status:   Future    Number of Occurrences:   1    Standing Expiration Date:   02/19/2018  . Prepare RBC    Standing Status:   Standing    Number of Occurrences:   1    Order Specific Question:   # of Units    Answer:   2 units    Order Specific Question:   Transfusion Indications    Answer:   Symptomatic Anemia    Order Specific Question:   If emergent release call blood bank    Answer:   Not emergent release    All questions were answered. The patient knows to call the clinic with any problems, questions or concerns.  This note was electronically signed.    Twana First, MD  02/19/2017 3:19 PM

## 2017-02-20 ENCOUNTER — Encounter (HOSPITAL_BASED_OUTPATIENT_CLINIC_OR_DEPARTMENT_OTHER): Payer: Medicare Other

## 2017-02-20 ENCOUNTER — Encounter (HOSPITAL_COMMUNITY): Payer: Self-pay

## 2017-02-20 ENCOUNTER — Other Ambulatory Visit (HOSPITAL_COMMUNITY): Payer: Medicare Other

## 2017-02-20 DIAGNOSIS — C9110 Chronic lymphocytic leukemia of B-cell type not having achieved remission: Secondary | ICD-10-CM

## 2017-02-20 DIAGNOSIS — C9190 Lymphoid leukemia, unspecified not having achieved remission: Secondary | ICD-10-CM | POA: Diagnosis not present

## 2017-02-20 DIAGNOSIS — D649 Anemia, unspecified: Secondary | ICD-10-CM | POA: Diagnosis not present

## 2017-02-20 MED ORDER — ACETAMINOPHEN 325 MG PO TABS
650.0000 mg | ORAL_TABLET | Freq: Once | ORAL | Status: AC
Start: 2017-02-20 — End: 2017-02-20
  Administered 2017-02-20: 650 mg via ORAL
  Filled 2017-02-20: qty 2

## 2017-02-20 MED ORDER — DIPHENHYDRAMINE HCL 25 MG PO CAPS
25.0000 mg | ORAL_CAPSULE | Freq: Once | ORAL | Status: AC
  Administered 2017-02-20: 25 mg via ORAL
  Filled 2017-02-20: qty 1

## 2017-02-20 MED ORDER — SODIUM CHLORIDE 0.9% FLUSH
10.0000 mL | INTRAVENOUS | Status: AC | PRN
  Administered 2017-02-20: 10 mL

## 2017-02-20 MED ORDER — SODIUM CHLORIDE 0.9 % IV SOLN
250.0000 mL | Freq: Once | INTRAVENOUS | Status: AC
  Administered 2017-02-20: 250 mL via INTRAVENOUS

## 2017-02-20 MED ORDER — HEPARIN SOD (PORK) LOCK FLUSH 100 UNIT/ML IV SOLN
500.0000 [IU] | Freq: Every day | INTRAVENOUS | Status: AC | PRN
  Administered 2017-02-20: 500 [IU]
  Filled 2017-02-20: qty 5

## 2017-02-20 NOTE — Progress Notes (Signed)
Randall Reyes tolerated blood transfusion well without complaints or incident. VSS upon discharge. Pt discharged self ambulatory using cane in satisfactory condition accompanied by his wife

## 2017-02-20 NOTE — Patient Instructions (Signed)
[facility] at [facility] Discharge Instructions  RECOMMENDATIONS MADE BY THE CONSULTANT AND ANY TEST RESULTS WILL BE SENT TO YOUR REFERRING PHYSICIAN.  Received 2 units of blood today. Follow-up as scheduled. Call clinic for any questions or concerns  Thank you for choosing [facility] at [facility] to provide your oncology and hematology care.  To afford each patient quality time with our provider, please arrive at least 15 minutes before your scheduled appointment time.    If you have a lab appointment with the Cancer Center please come in thru the  Main Entrance and check in at the main information desk  You need to re-schedule your appointment should you arrive 10 or more minutes late.  We strive to give you quality time with our providers, and arriving late affects you and other patients whose appointments are after yours.  Also, if you no show three or more times for appointments you may be dismissed from the clinic at the providers discretion.     Again, thank you for choosing [facility].  Our hope is that these requests will decrease the amount of time that you wait before being seen by our physicians.       _____________________________________________________________  Should you have questions after your visit to [facility], please contact our office at [phone] between the hours of 8:30 a.m. and 4:30 p.m.  Voicemails left after 4:30 p.m. will not be returned until the following business day.  For prescription refill requests, have your pharmacy contact our office.       Resources For Cancer Patients and their Caregivers ? American Cancer Society: Can assist with transportation, wigs, general needs, runs Look Good Feel Better.        1-888-227-6333 ? Cancer Care: Provides financial assistance, online support groups, medication/co-pay assistance.  1-800-813-HOPE (4673) ? [facility] Assists [place] cancer patients and their families through emotional , educational and financial support.  336-427-4357 ? [place] DSS Where to apply for food stamps, Medicaid and utility assistance. 336-342-1394 ? [facility]: Transportation to medical appointments. 336-347-2287 ? Social Security Administration: May apply for disability if have a Stage IV cancer. 336-342-7796 1-800-772-1213 ? [place] Aging, Disability and Transit Services: Assists with nutrition, care and transit needs. 336-349-2343  Cancer Center Support Programs: [email or web address]@ > Cancer Support Group  2nd Tuesday of the month 1pm-2pm, Journey Room  > Creative Journey  3rd Tuesday of the month 1130am-1pm, Journey Room  > Look Good Feel Better  1st Wednesday of the month 10am-12 noon, Journey Room (Call American Cancer Society to register 1-800-395-5775)   

## 2017-02-21 ENCOUNTER — Ambulatory Visit (INDEPENDENT_AMBULATORY_CARE_PROVIDER_SITE_OTHER): Payer: Medicare Other | Admitting: *Deleted

## 2017-02-21 DIAGNOSIS — R001 Bradycardia, unspecified: Secondary | ICD-10-CM | POA: Diagnosis not present

## 2017-02-21 LAB — BPAM RBC
BLOOD PRODUCT EXPIRATION DATE: 201810292359
BLOOD PRODUCT EXPIRATION DATE: 201810292359
Blood Product Expiration Date: 201810122359
ISSUE DATE / TIME: 201810101045
ISSUE DATE / TIME: 201810101253
UNIT TYPE AND RH: 6200
Unit Type and Rh: 6200
Unit Type and Rh: 7300

## 2017-02-21 LAB — TYPE AND SCREEN
ABO/RH(D): AB POS
Antibody Screen: NEGATIVE
UNIT DIVISION: 0
UNIT DIVISION: 0
Unit division: 0

## 2017-02-21 NOTE — Progress Notes (Signed)
Wound check appointment. Steri-strips removed. Wound without redness or edema. Incision edges approximated, wound well healed. Normal device function. Thresholds, sensing, and impedances consistent with implant measurements. Device programmed at 3.5V for extra safety margin until 3 month visit. Histogram distribution appropriate for patient and level of activity. 100% AF + Eliquis. No high ventricular rates noted. Patient educated about wound care, arm mobility, lifting restrictions. ROV 05/21/2017 w/ WC

## 2017-02-22 ENCOUNTER — Other Ambulatory Visit (HOSPITAL_COMMUNITY): Payer: Medicare Other

## 2017-02-22 DIAGNOSIS — R001 Bradycardia, unspecified: Secondary | ICD-10-CM | POA: Diagnosis not present

## 2017-02-22 DIAGNOSIS — I4892 Unspecified atrial flutter: Secondary | ICD-10-CM | POA: Diagnosis not present

## 2017-02-22 DIAGNOSIS — I110 Hypertensive heart disease with heart failure: Secondary | ICD-10-CM | POA: Diagnosis not present

## 2017-02-22 DIAGNOSIS — Z7901 Long term (current) use of anticoagulants: Secondary | ICD-10-CM | POA: Diagnosis not present

## 2017-02-22 DIAGNOSIS — E119 Type 2 diabetes mellitus without complications: Secondary | ICD-10-CM | POA: Diagnosis not present

## 2017-02-22 DIAGNOSIS — D61818 Other pancytopenia: Secondary | ICD-10-CM | POA: Diagnosis not present

## 2017-02-22 DIAGNOSIS — E883 Tumor lysis syndrome: Secondary | ICD-10-CM | POA: Diagnosis not present

## 2017-02-22 DIAGNOSIS — Z7984 Long term (current) use of oral hypoglycemic drugs: Secondary | ICD-10-CM | POA: Diagnosis not present

## 2017-02-22 DIAGNOSIS — I509 Heart failure, unspecified: Secondary | ICD-10-CM | POA: Diagnosis not present

## 2017-02-22 DIAGNOSIS — C9110 Chronic lymphocytic leukemia of B-cell type not having achieved remission: Secondary | ICD-10-CM | POA: Diagnosis not present

## 2017-02-25 DIAGNOSIS — I509 Heart failure, unspecified: Secondary | ICD-10-CM | POA: Diagnosis not present

## 2017-02-25 DIAGNOSIS — C9110 Chronic lymphocytic leukemia of B-cell type not having achieved remission: Secondary | ICD-10-CM | POA: Diagnosis not present

## 2017-02-25 DIAGNOSIS — Z7984 Long term (current) use of oral hypoglycemic drugs: Secondary | ICD-10-CM | POA: Diagnosis not present

## 2017-02-25 DIAGNOSIS — E883 Tumor lysis syndrome: Secondary | ICD-10-CM | POA: Diagnosis not present

## 2017-02-25 DIAGNOSIS — Z7901 Long term (current) use of anticoagulants: Secondary | ICD-10-CM | POA: Diagnosis not present

## 2017-02-25 DIAGNOSIS — E119 Type 2 diabetes mellitus without complications: Secondary | ICD-10-CM | POA: Diagnosis not present

## 2017-02-25 DIAGNOSIS — I4892 Unspecified atrial flutter: Secondary | ICD-10-CM | POA: Diagnosis not present

## 2017-02-25 DIAGNOSIS — R001 Bradycardia, unspecified: Secondary | ICD-10-CM | POA: Diagnosis not present

## 2017-02-25 DIAGNOSIS — I110 Hypertensive heart disease with heart failure: Secondary | ICD-10-CM | POA: Diagnosis not present

## 2017-02-25 DIAGNOSIS — D61818 Other pancytopenia: Secondary | ICD-10-CM | POA: Diagnosis not present

## 2017-02-26 ENCOUNTER — Encounter: Payer: Self-pay | Admitting: Cardiovascular Disease

## 2017-02-26 ENCOUNTER — Ambulatory Visit (INDEPENDENT_AMBULATORY_CARE_PROVIDER_SITE_OTHER): Payer: Medicare Other | Admitting: Cardiovascular Disease

## 2017-02-26 VITALS — BP 118/70 | HR 68 | Ht 61.0 in | Wt 132.0 lb

## 2017-02-26 DIAGNOSIS — Z950 Presence of cardiac pacemaker: Secondary | ICD-10-CM

## 2017-02-26 DIAGNOSIS — C9190 Lymphoid leukemia, unspecified not having achieved remission: Secondary | ICD-10-CM

## 2017-02-26 DIAGNOSIS — I38 Endocarditis, valve unspecified: Secondary | ICD-10-CM

## 2017-02-26 DIAGNOSIS — D649 Anemia, unspecified: Secondary | ICD-10-CM | POA: Diagnosis not present

## 2017-02-26 DIAGNOSIS — Z9289 Personal history of other medical treatment: Secondary | ICD-10-CM | POA: Diagnosis not present

## 2017-02-26 DIAGNOSIS — I4891 Unspecified atrial fibrillation: Secondary | ICD-10-CM | POA: Diagnosis not present

## 2017-02-26 DIAGNOSIS — D696 Thrombocytopenia, unspecified: Secondary | ICD-10-CM

## 2017-02-26 DIAGNOSIS — C9110 Chronic lymphocytic leukemia of B-cell type not having achieved remission: Secondary | ICD-10-CM

## 2017-02-26 NOTE — Patient Instructions (Signed)

## 2017-02-26 NOTE — Progress Notes (Addendum)
SUBJECTIVE: The patient presents for posthospitalization follow-up. He was hospitalized in September 2018 for syncope/complete heart block and pulseless arrest on admission. He had 5 second pauses and ultimately underwent pacemaker placement on 02/07/17. It was felt by the hospitalist that overall his prognosis is reasonable but long-term prognosis is guarded.  Echocardiogram on 01/29/17 demonstrated normal left ventricular systolic function, LVEF 67%, with grade 1 diastolic dysfunction, and mild aortic, mitral, and pulmonic regurgitation.  He has atrial flutter.  He was discharged on Eliquis 2.5 mg twice daily. He recently saw his oncologist.  CBC 02/19/17: Hemoglobin 7.3, platelets 56.  He denies chest pain, dizziness, leg swelling, and shortness of breath. He feels more energetic.    Review of Systems: As per "subjective", otherwise negative.  Allergies  Allergen Reactions  . Aspirin Other (See Comments)    Bleeding ulcers.   . Ibuprofen Other (See Comments)    Bleeding ulcers.    Current Outpatient Prescriptions  Medication Sig Dispense Refill  . acetaminophen (TYLENOL) 500 MG tablet Take 500 mg by mouth at bedtime as needed for mild pain or moderate pain.     [name] apixaban (ELIQUIS) 2.5 MG TABS tablet Take 1 tablet (2.5 mg total) by mouth 2 (two) times daily. 60 tablet 0  . Ascorbic Acid (VITAMIN C PO) Take 1 tablet by mouth daily.    [name] atovaquone (MEPRON) 750 MG/5ML suspension Take 10 mLs (1,500 mg total) by mouth daily with breakfast. 210 mL 0  . cholecalciferol (VITAMIN D) 400 units TABS tablet Take 400 Units by mouth daily.    Mariane Baumgarten Calcium (STOOL SOFTENER PO) Take 1 capsule by mouth as needed.    . levETIRAcetam (KEPPRA) 100 MG/ML solution Take 7.5 mLs (750 mg total) by mouth 2 (two) times daily. 473 mL 12  . lidocaine-prilocaine (EMLA) cream Apply a quarter size amount to affected area 1 hour prior to coming to chemotherapy.  Do not rub in.  Cover with plastic  wrap. 30 g 2  . ondansetron (ZOFRAN) 8 MG tablet Take 1 tablet (8 mg total) by mouth every 8 (eight) hours as needed for nausea or vomiting. 30 tablet 2  . potassium chloride SA (K-DUR,KLOR-CON) 20 MEQ tablet Take 2 tablets (40 mEq total) by mouth 2 (two) times daily. 14 tablet 0  . prochlorperazine (COMPAZINE) 10 MG tablet Take 1 tablet (10 mg total) by mouth every 6 (six) hours as needed for nausea or vomiting. 30 tablet 2  . rosuvastatin (CRESTOR) 40 MG tablet Take 40 mg by mouth at bedtime.     . sevelamer carbonate (RENVELA) 800 MG tablet Take 2 tablets (1,600 mg total) by mouth 3 (three) times daily with meals. (Patient taking differently: Take 800 mg by mouth 3 (three) times daily with meals. ) 180 tablet 2  . sitaGLIPtin (JANUVIA) 100 MG tablet Take 100 mg by mouth daily.    . sodium bicarbonate 650 MG tablet TAKE 1 TABLET BY MOUTH TWICE A DAY AS DIRECTED  3   No current facility-administered medications for this visit.    Facility-Administered Medications Ordered in Other Visits  Medication Dose Route Frequency Provider Last Rate Last Dose  . heparin lock flush 100 unit/mL  500 Units Intravenous Once Holley Bouche, NP      . sodium chloride flush (NS) 0.9 % injection 10 mL  10 mL Intravenous PRN Holley Bouche, NP        Past Medical History:  Diagnosis Date  . Anemia   .  B12 deficiency 12/07/2014  . CLL (chronic lymphocytic leukemia) ([place]) 01/24/2011  . Diabetes mellitus   . DM (diabetes mellitus) ([place]) 01/24/2011  . Eczema 01/24/2011  . History of pneumonia 04/2014  . Hypertension   . Leukemia ([place]) 6.22.2012   PER PATIENT  . Melanoma in situ ([place]) 01/24/2011  . Mild obesity 01/24/2011  . Port catheter in place 05/02/2012  . Shingles   . Ulcer     Past Surgical History:  Procedure Laterality Date  . bleeding ulcer    . CATARACT EXTRACTION W/PHACO  05/05/2012   Procedure: CATARACT EXTRACTION PHACO AND INTRAOCULAR LENS PLACEMENT (IOC);  Surgeon: Tonny Branch, MD;   Location: [facility];  Service: Ophthalmology;  Laterality: Right;  CDE:13.25  . CATARACT EXTRACTION W/PHACO  05/15/2012   Procedure: CATARACT EXTRACTION PHACO AND INTRAOCULAR LENS PLACEMENT (IOC);  Surgeon: Tonny Branch, MD;  Location: [facility];  Service: Ophthalmology;  Laterality: Left;  CDE:  12.32  . COLONOSCOPY N/A 01/30/2016   Procedure: COLONOSCOPY;  Surgeon: Danie Binder, MD;  Location[facility];  Service: Endoscopy;  Laterality: N/A;  2:00 PM - moved to 1:00 - office notified pt  . ESOPHAGOGASTRODUODENOSCOPY  2008   Dr. Oneida Alar: normal esophagus, antral erythema, 1 cm clean based duodenal ulcera, negative H. plyori  . ESOPHAGOGASTRODUODENOSCOPY N/A 01/30/2016   Procedure: ESOPHAGOGASTRODUODENOSCOPY (EGD);  Surgeon: Danie Binder, MD;  [facility];  Service: Endoscopy;  Laterality: N/A;  . HERNIA REPAIR  2001  . PACEMAKER IMPLANT N/A 02/07/2017   Procedure: PACEMAKER IMPLANT;  Surgeon: Constance Haw, MD;  Location: [place] [facility];  Service: Cardiovascular;  Laterality: N/A;  . PORTACATH PLACEMENT  2008    Social History   Social History  . Marital status: Married    Spouse name: N/A  . Number of children: N/A  . Years of education: N/A   Occupational History  . Not on file.   Social History Main Topics  . Smoking status: Former Research scientist (life sciences)  . Smokeless tobacco: Former Systems developer    Quit date: 04/25/1967  . Alcohol use No  . Drug use: No  . Sexual activity: Not on file   Other Topics Concern  . Not on file   Social History Narrative  . No narrative on file     Vitals:   02/26/17 1043  BP: 118/70  Pulse: 68  SpO2: 97%  Weight: 132 lb (59.9 kg)  Height: 5\' 1"  (1.549 m)    Wt Readings from Last 3 Encounters:  02/26/17 132 lb (59.9 kg)  02/19/17 139 lb (63 kg)  02/08/17 128 lb 12 oz (58.4 kg)     PHYSICAL EXAM General: NAD HEENT: Normal. Neck: No JVD, no thyromegaly. Lungs: Clear to auscultation bilaterally with normal respiratory effort. CV:  Nondisplaced PMI.  Regular rate and rhythm, normal S1/S2, no W0/J8, soft 1/6 systolic murmur along LSB. No pretibial or periankle edema.  Abdomen: Soft, nontender, no distention.  Neurologic: Alert and oriented.  Psych: Normal affect. Skin: Normal. Musculoskeletal: No gross deformities.    ECG: Most recent ECG reviewed.   Labs: Lab Results  Component Value Date/Time   K 4.1 02/19/2017 01:33 PM   BUN 11 02/19/2017 01:33 PM   CREATININE 1.71 (H) 02/19/2017 01:33 PM   ALT 16 (L) 02/19/2017 01:33 PM   TSH 2.324 05/23/2016 05:33 PM   TSH 1.606 08/09/2014 11:00 PM   HGB 7.3 (L) 02/19/2017 01:33 PM     Lipids: No results found for: LDLCALC, LDLDIRECT,  CHOL, TRIG, HDL     ASSESSMENT AND PLAN: 1. Symptomatic bradycardia status post implantation of Medtronic Azure XT DR MRI SureScan dual-chamber pacemaker: Stable. Normal device function. Follows with EP. I will make certain he is enrolled in device clinic for routine monitoring.  2. Atrial flutter and fibrillation: Currently on low-dose Eliquis 2.5 mg twice daily. Platelets were 56,000 when checked on 02/19/17. I have sent a message to his oncologist to see what she recommends with respect to continuing anticoagulation given his elevated bleeding risk with both anemia and thrombocytopenia.  3. Valvular heart disease: Stable.  4. Anemia and thrombocytopenia/CLL: Platelets were 56,000 when checked on 02/19/17. I have sent a message to his oncologist to see what she recommends with respect to continuing anticoagulation given his elevated bleeding risk with both anemia and thrombocytopenia.   Disposition: Follow up 6 months.  Time spent: 40 minutes, of which greater than 50% was spent reviewing symptoms, relevant blood tests and studies, and discussing management plan with the patient.  ADDENDUM: I received feedback from his oncologist, Dr. Talbert Cage. She thinks it's fine to keep him on Eliquis 2.5 mg bid.   [name], M.D.,  F.A.C.C.

## 2017-02-27 ENCOUNTER — Ambulatory Visit: Payer: Medicare Other | Admitting: Cardiovascular Disease

## 2017-02-27 DIAGNOSIS — R001 Bradycardia, unspecified: Secondary | ICD-10-CM | POA: Diagnosis not present

## 2017-02-27 DIAGNOSIS — I110 Hypertensive heart disease with heart failure: Secondary | ICD-10-CM | POA: Diagnosis not present

## 2017-02-27 DIAGNOSIS — I4892 Unspecified atrial flutter: Secondary | ICD-10-CM | POA: Diagnosis not present

## 2017-02-27 DIAGNOSIS — E119 Type 2 diabetes mellitus without complications: Secondary | ICD-10-CM | POA: Diagnosis not present

## 2017-02-27 DIAGNOSIS — I509 Heart failure, unspecified: Secondary | ICD-10-CM | POA: Diagnosis not present

## 2017-02-27 DIAGNOSIS — E883 Tumor lysis syndrome: Secondary | ICD-10-CM | POA: Diagnosis not present

## 2017-02-27 DIAGNOSIS — D61818 Other pancytopenia: Secondary | ICD-10-CM | POA: Diagnosis not present

## 2017-02-27 DIAGNOSIS — Z7901 Long term (current) use of anticoagulants: Secondary | ICD-10-CM | POA: Diagnosis not present

## 2017-02-27 DIAGNOSIS — Z7984 Long term (current) use of oral hypoglycemic drugs: Secondary | ICD-10-CM | POA: Diagnosis not present

## 2017-02-27 DIAGNOSIS — C9110 Chronic lymphocytic leukemia of B-cell type not having achieved remission: Secondary | ICD-10-CM | POA: Diagnosis not present

## 2017-03-06 ENCOUNTER — Ambulatory Visit: Payer: Medicare Other | Admitting: Nurse Practitioner

## 2017-03-08 LAB — TISSUE HYBRIDIZATION TO NCBH

## 2017-03-21 DIAGNOSIS — C44329 Squamous cell carcinoma of skin of other parts of face: Secondary | ICD-10-CM | POA: Diagnosis not present

## 2017-04-19 ENCOUNTER — Encounter (HOSPITAL_COMMUNITY): Payer: Self-pay | Admitting: Oncology

## 2017-04-19 ENCOUNTER — Encounter (HOSPITAL_COMMUNITY): Payer: Medicare Other | Attending: Oncology

## 2017-04-19 ENCOUNTER — Encounter (HOSPITAL_BASED_OUTPATIENT_CLINIC_OR_DEPARTMENT_OTHER): Payer: Medicare Other | Admitting: Oncology

## 2017-04-19 ENCOUNTER — Encounter (HOSPITAL_COMMUNITY): Payer: Medicare Other

## 2017-04-19 VITALS — BP 124/71 | HR 88 | Temp 97.8°F | Resp 18 | Wt 135.0 lb

## 2017-04-19 DIAGNOSIS — Z452 Encounter for adjustment and management of vascular access device: Secondary | ICD-10-CM | POA: Diagnosis not present

## 2017-04-19 DIAGNOSIS — C9110 Chronic lymphocytic leukemia of B-cell type not having achieved remission: Secondary | ICD-10-CM | POA: Insufficient documentation

## 2017-04-19 LAB — CBC WITH DIFFERENTIAL/PLATELET
BASOS ABS: 0.5 10*3/uL — AB (ref 0.0–0.1)
BASOS PCT: 1 %
Band Neutrophils: 0 %
Blasts: 0 %
EOS ABS: 1.4 10*3/uL — AB (ref 0.0–0.7)
EOS PCT: 3 %
HCT: 33.8 % — ABNORMAL LOW (ref 39.0–52.0)
HEMOGLOBIN: 10.6 g/dL — AB (ref 13.0–17.0)
Lymphocytes Relative: 87 %
Lymphs Abs: 39.9 10*3/uL — ABNORMAL HIGH (ref 0.7–4.0)
MCH: 35.2 pg — ABNORMAL HIGH (ref 26.0–34.0)
MCHC: 31.4 g/dL (ref 30.0–36.0)
MCV: 112.3 fL — ABNORMAL HIGH (ref 78.0–100.0)
METAMYELOCYTES PCT: 0 %
MONO ABS: 1.8 10*3/uL — AB (ref 0.1–1.0)
MYELOCYTES: 0 %
Monocytes Relative: 4 %
NEUTROS PCT: 5 %
Neutro Abs: 2.3 10*3/uL (ref 1.7–7.7)
PLATELETS: 146 10*3/uL — AB (ref 150–400)
PROMYELOCYTES ABS: 0 %
RBC: 3.01 MIL/uL — ABNORMAL LOW (ref 4.22–5.81)
RDW: 15.8 % — ABNORMAL HIGH (ref 11.5–15.5)
WBC: 45.9 10*3/uL — ABNORMAL HIGH (ref 4.0–10.5)
nRBC: 0 /100 WBC

## 2017-04-19 LAB — COMPREHENSIVE METABOLIC PANEL
ALBUMIN: 4.2 g/dL (ref 3.5–5.0)
ALK PHOS: 102 U/L (ref 38–126)
ALT: 15 U/L — AB (ref 17–63)
AST: 22 U/L (ref 15–41)
Anion gap: 5 (ref 5–15)
BUN: 23 mg/dL — ABNORMAL HIGH (ref 6–20)
CO2: 30 mmol/L (ref 22–32)
CREATININE: 1.86 mg/dL — AB (ref 0.61–1.24)
Calcium: 8.8 mg/dL — ABNORMAL LOW (ref 8.9–10.3)
Chloride: 104 mmol/L (ref 101–111)
GFR calc non Af Amer: 33 mL/min — ABNORMAL LOW (ref >60)
GFR, EST AFRICAN AMERICAN: 38 mL/min — AB (ref >60)
GLUCOSE: 127 mg/dL — AB (ref 65–99)
Potassium: 4.3 mmol/L (ref 3.5–5.1)
SODIUM: 139 mmol/L (ref 135–145)
Total Bilirubin: 0.4 mg/dL (ref 0.3–1.2)
Total Protein: 6.9 g/dL (ref 6.5–8.1)

## 2017-04-19 MED ORDER — SODIUM CHLORIDE 0.9% FLUSH
10.0000 mL | INTRAVENOUS | Status: DC | PRN
  Administered 2017-04-19: 10 mL via INTRAVENOUS
  Filled 2017-04-19: qty 10

## 2017-04-19 MED ORDER — HEPARIN SOD (PORK) LOCK FLUSH 100 UNIT/ML IV SOLN
500.0000 [IU] | Freq: Once | INTRAVENOUS | Status: AC
  Administered 2017-04-19: 500 [IU] via INTRAVENOUS

## 2017-04-19 NOTE — Progress Notes (Signed)
Randall Reyes presented for Portacath access and flush. Portacath located right chest wall accessed with  H 20 needle. No blood return, flushed without difficulty or discomfort Portacath flushed with 9ml NS and 500U/47ml Heparin and needle removed intact. Patient tolerated treatment without incidence. Patient discharged ambulatory and in stable condition from clinic. Patient to follow up as scheduled.

## 2017-04-19 NOTE — Patient Instructions (Signed)
[facility]  Discharge Instructions:  You were seen by dr. zhou today _______________________________________________________________  Thank you for choosing [facility] at [facility] to provide your oncology and hematology care.  To afford each patient quality time with our providers, please arrive at least 15 minutes before your scheduled appointment.  You need to re-schedule your appointment if you arrive 10 or more minutes late.  We strive to give you quality time with our providers, and arriving late affects you and other patients whose appointments are after yours.  Also, if you no show three or more times for appointments you may be dismissed from the clinic.  Again, thank you for choosing [facility] at [facility]. Our hope is that these requests will allow you access to exceptional care and in a timely manner. _______________________________________________________________  If you have questions after your visit, please contact our office at [phone] between the hours of 8:30 a.m. and 5:00 p.m. Voicemails left after 4:30 p.m. will not be returned until the following business day. _______________________________________________________________  For prescription refill requests, have your pharmacy contact our office. _______________________________________________________________  Recommendations made by the consultant and any test results will be sent to your referring physician. _______________________________________________________________ 

## 2017-04-22 ENCOUNTER — Ambulatory Visit (HOSPITAL_COMMUNITY): Payer: Medicare Other

## 2017-04-22 ENCOUNTER — Other Ambulatory Visit (HOSPITAL_COMMUNITY): Payer: Medicare Other

## 2017-05-02 DIAGNOSIS — L980 Pyogenic granuloma: Secondary | ICD-10-CM | POA: Diagnosis not present

## 2017-05-02 DIAGNOSIS — Z08 Encounter for follow-up examination after completed treatment for malignant neoplasm: Secondary | ICD-10-CM | POA: Diagnosis not present

## 2017-05-02 DIAGNOSIS — Z85828 Personal history of other malignant neoplasm of skin: Secondary | ICD-10-CM | POA: Diagnosis not present

## 2017-05-06 ENCOUNTER — Encounter: Payer: Medicare Other | Admitting: Cardiology

## 2017-05-09 DIAGNOSIS — Z85828 Personal history of other malignant neoplasm of skin: Secondary | ICD-10-CM | POA: Diagnosis not present

## 2017-05-09 DIAGNOSIS — L980 Pyogenic granuloma: Secondary | ICD-10-CM | POA: Diagnosis not present

## 2017-05-09 DIAGNOSIS — Z08 Encounter for follow-up examination after completed treatment for malignant neoplasm: Secondary | ICD-10-CM | POA: Diagnosis not present

## 2017-05-21 ENCOUNTER — Ambulatory Visit: Payer: Medicare Other | Admitting: Cardiology

## 2017-05-21 ENCOUNTER — Encounter: Payer: Self-pay | Admitting: Cardiology

## 2017-05-21 ENCOUNTER — Encounter (INDEPENDENT_AMBULATORY_CARE_PROVIDER_SITE_OTHER): Payer: Self-pay

## 2017-05-21 VITALS — BP 120/78 | HR 77 | Ht 61.0 in | Wt 143.0 lb

## 2017-05-21 DIAGNOSIS — I4892 Unspecified atrial flutter: Secondary | ICD-10-CM

## 2017-05-21 DIAGNOSIS — I459 Conduction disorder, unspecified: Secondary | ICD-10-CM | POA: Diagnosis not present

## 2017-05-21 DIAGNOSIS — Z950 Presence of cardiac pacemaker: Secondary | ICD-10-CM | POA: Diagnosis not present

## 2017-05-21 NOTE — Patient Instructions (Signed)
Medication Instructions:  Your physician recommends that you continue on your current medications as directed. Please refer to the Current Medication list given to you today.  *If you need a refill on your cardiac medications before your next appointment, please call your pharmacy*  Labwork: None ordered  Testing/Procedures: None ordered  Follow-Up: Remote monitoring is used to monitor your Pacemaker or ICD from home. This monitoring reduces the number of office visits required to check your device to one time per year. It allows [place] to keep an eye on the functioning of your device to ensure it is working properly. You are scheduled for a device check from home on 08/20/2017. You may send your transmission at any time that day. If you have a wireless device, the transmission will be sent automatically. After your physician reviews your transmission, you will receive a postcard with your next transmission date.  Your physician wants you to follow-up in: 9 months with Dr. Curt Bears.  You will receive a reminder letter in the mail two months in advance. If you don't receive a letter, please call our office to schedule the follow-up appointment.  Thank you for choosing [facility]!!   Trinidad Curet, RN [phone]  Any Other Special Instructions Will Be Listed Below (If Applicable).

## 2017-05-21 NOTE — Progress Notes (Signed)
Electrophysiology Office Note   Date:  05/21/2017   ID:  Randall Reyes, DOB 22-Aug-1935, MRN [id number]  PCP:  [name], MD  Cardiologist:  Bronson Ing Primary Electrophysiologist:  Dimitri Shakespeare Meredith Leeds, MD    Chief Complaint  Patient presents with  . Pacemaker Check    Heart block/Typical AFlutter/91 days post implant     History of Present Illness: Randall Reyes is a 82 y.o. male who is being seen today for the evaluation of heart block, pacemaker at the request of [name], MD. Presenting today for electrophysiology evaluation.  He has a history of CLL.  He was seen in the hospital on 9/21/8 after a syncopal episode.  He did have chemo that day.  He required CPR and EMS was called.  He was brought to the emergency room, had another pulseless episode and another round of CPR.  He was treated for acute renal failure with CRRT, and required intubation.  Medtronic dual-chamber pacemaker implanted 02/07/17.    Today, he denies symptoms of palpitations, chest pain, shortness of breath, orthopnea, PND, lower extremity edema, claudication, dizziness, presyncope, syncope, bleeding, or neurologic sequela. The patient is tolerating medications without difficulties.    Past Medical History:  Diagnosis Date  . Anemia   . B12 deficiency 12/07/2014  . CLL (chronic lymphocytic leukemia) ([place]) 01/24/2011  . Diabetes mellitus   . DM (diabetes mellitus) ([place]) 01/24/2011  . Eczema 01/24/2011  . History of pneumonia 04/2014  . Hypertension   . Leukemia ([place]) 6.22.2012   PER PATIENT  . Melanoma in situ ([place]) 01/24/2011  . Mild obesity 01/24/2011  . Port catheter in place 05/02/2012  . Shingles   . Ulcer    Past Surgical History:  Procedure Laterality Date  . bleeding ulcer    . CATARACT EXTRACTION W/PHACO  05/05/2012   Procedure: CATARACT EXTRACTION PHACO AND INTRAOCULAR LENS PLACEMENT (IOC);  Surgeon: Tonny Branch, MD;  Location: [facility];  Service: Ophthalmology;  Laterality:  Right;  CDE:13.25  . CATARACT EXTRACTION W/PHACO  05/15/2012   Procedure: CATARACT EXTRACTION PHACO AND INTRAOCULAR LENS PLACEMENT (IOC);  Surgeon: Tonny Branch, MD;  Location: [facility];  Service: Ophthalmology;  Laterality: Left;  CDE:  12.32  . COLONOSCOPY N/A 01/30/2016   Procedure: COLONOSCOPY;  Surgeon: Danie Binder, MD;  [facility];  Service: Endoscopy;  Laterality: N/A;  2:00 PM - moved to 1:00 - office notified pt  . ESOPHAGOGASTRODUODENOSCOPY  2008   Dr. Oneida Alar: normal esophagus, antral erythema, 1 cm clean based duodenal ulcera, negative H. plyori  . ESOPHAGOGASTRODUODENOSCOPY N/A 01/30/2016   Procedure: ESOPHAGOGASTRODUODENOSCOPY (EGD);  Surgeon: Danie Binder, MD;  [facility];  Service: Endoscopy;  Laterality: N/A;  . HERNIA REPAIR  2001  . PACEMAKER IMPLANT N/A 02/07/2017   Procedure: PACEMAKER IMPLANT;  Surgeon: Constance Haw, MD;  Location: [place] CV LAB;  Service: Cardiovascular;  Laterality: N/A;  . PORTACATH PLACEMENT  2008     Current Outpatient Medications  Medication Sig Dispense Refill  . acetaminophen (TYLENOL) 500 MG tablet Take 500 mg by mouth at bedtime as needed for mild pain or moderate pain.     . Ascorbic Acid (VITAMIN C PO) Take 1 tablet by mouth daily.    . cholecalciferol (VITAMIN D) 400 units TABS tablet Take 400 Units by mouth daily.    Mariane Baumgarten Calcium (STOOL SOFTENER PO) Take 1 capsule by mouth as needed.    . levETIRAcetam (KEPPRA) 100 MG/ML  solution Take 7.5 mLs (750 mg total) by mouth 2 (two) times daily. 473 mL 12  . lidocaine-prilocaine (EMLA) cream Apply a quarter size amount to affected area 1 hour prior to coming to chemotherapy.  Do not rub in.  Cover with plastic wrap. 30 g 2  . potassium chloride SA (K-DUR,KLOR-CON) 20 MEQ tablet Take 2 tablets (40 mEq total) by mouth 2 (two) times daily. 14 tablet 0  . rosuvastatin (CRESTOR) 40 MG tablet Take 40 mg by mouth at bedtime.     . sevelamer carbonate (RENVELA) 800  MG tablet Take 2 tablets (1,600 mg total) by mouth 3 (three) times daily with meals. (Patient taking differently: Take 800 mg by mouth 3 (three) times daily with meals. ) 180 tablet 2  . sitaGLIPtin (JANUVIA) 100 MG tablet Take 100 mg by mouth daily.    . sodium bicarbonate 650 MG tablet TAKE 1 TABLET BY MOUTH TWICE A DAY AS DIRECTED  3   No current facility-administered medications for this visit.    Facility-Administered Medications Ordered in Other Visits  Medication Dose Route Frequency Provider Last Rate Last Dose  . heparin lock flush 100 unit/mL  500 Units Intravenous Once Holley Bouche, NP      . sodium chloride flush (NS) 0.9 % injection 10 mL  10 mL Intravenous PRN Holley Bouche, NP        Allergies:   Aspirin and Ibuprofen   Social History:  The patient  reports that he has quit smoking. He quit smokeless tobacco use about 50 years ago. He reports that he does not drink alcohol or use drugs.   Family History:  The patient's family history includes Diabetes in his father.    ROS:  Please see the history of present illness.   Otherwise, review of systems is positive for none.   All other systems are reviewed and negative.    PHYSICAL EXAM: VS:  BP 120/78   Pulse 77   Ht 5\' 1"  (1.549 m)   Wt 143 lb (64.9 kg)   BMI 27.02 kg/m  , BMI Body mass index is 27.02 kg/m. GEN: Well nourished, well developed, in no acute distress  HEENT: normal  Neck: no JVD, carotid bruits, or masses Cardiac: RRR; no murmurs, rubs, or gallops,no edema  Respiratory:  clear to auscultation bilaterally, normal work of breathing GI: soft, nontender, nondistended, + BS MS: no deformity or atrophy  Skin: warm and dry, device pocket is well healed Neuro:  Strength and sensation are intact Psych: euthymic mood, full affect  EKG:  EKG is ordered today. Personal review of the ekg ordered shows atrial flutter  Device interrogation is reviewed today in detail.  See PaceArt for  details.   Recent Labs: 05/23/2016: TSH 2.324 02/08/2017: Magnesium 1.7 04/19/2017: ALT 15; BUN 23; Creatinine, Ser 1.86; Hemoglobin 10.6; Platelets 146; Potassium 4.3; Sodium 139    Lipid Panel  No results found for: CHOL, TRIG, HDL, CHOLHDL, VLDL, LDLCALC, LDLDIRECT   Wt Readings from Last 3 Encounters:  05/21/17 143 lb (64.9 kg)  04/19/17 135 lb (61.2 kg)  02/26/17 132 lb (59.9 kg)      Other studies Reviewed: Additional studies/ records that were reviewed today include: TTE 01/30/17  Review of the above records today demonstrates:  - Left ventricle: The cavity size was normal. Systolic function was   normal. The estimated ejection fraction was 55%. Wall motion was   normal; there were no regional wall motion abnormalities. There  was an increased relative contribution of atrial contraction to   ventricular filling. Doppler parameters are consistent with   abnormal left ventricular relaxation (grade 1 diastolic   dysfunction). - Aortic valve: Trileaflet; normal thickness, mildly calcified   leaflets. There was mild regurgitation. - Mitral valve: There was mild regurgitation. - Pulmonic valve: There was mild regurgitation. - Inferior vena cava: The vessel was mildly dilated. - Pericardium, extracardiac: A possible, trivial, free-flowing   pericardial effusion was identified at the apex. The fluid had no   internal echoes.   ASSESSMENT AND PLAN:  1.  High-grade heart block in the setting of hypokalemia: Status post Medtronic dual-chamber pacemaker 02/07/17.  Lysis functioning appropriately.  He has been in atrial flutter since the device was implanted and is not able to be anticoagulated due to thrombocytopenia.  Have switched him from DDD to VVIR.  2.  Atrial flutter: Has thrombocytopenia and thus not anticoagulated.  This patients CHA2DS2-VASc Score and unadjusted Ischem thrombocytopenia ic Stroke Rate (% per year) is equal to 3.2 % stroke rate/year from a score of  3  Above score calculated as 1 point each if present [CHF, HTN, DM, Vascular=MI/PAD/Aortic Plaque, Age if 65-74, or Male] Above score calculated as 2 points each if present [Age > 75, or Stroke/TIA/TE]  Current medicines are reviewed at length with the patient today.   The patient does not have concerns regarding his medicines.  The following changes were made today:  none  Labs/ tests ordered today include:  Orders Placed This Encounter  Procedures  . EKG 12-Lead     Disposition:   FU with Valentine Barney 9 months  Signed, Kinisha Soper Meredith Leeds, MD  05/21/2017 3:31 PM     [facility] [place] [address] [phone] (office) [phone] (fax)

## 2017-05-23 LAB — CUP PACEART INCLINIC DEVICE CHECK
Battery Remaining Longevity: 127 mo
Implantable Lead Implant Date: 20180927
Implantable Lead Location: 753859
Implantable Lead Location: 753860
Implantable Lead Model: 5076
Implantable Pulse Generator Implant Date: 20180927
Lead Channel Impedance Value: 266 Ohm
Lead Channel Impedance Value: 342 Ohm
Lead Channel Pacing Threshold Amplitude: 0.5 V
Lead Channel Pacing Threshold Pulse Width: 0.4 ms
Lead Channel Sensing Intrinsic Amplitude: 11.75 mV
Lead Channel Setting Pacing Amplitude: 2.75 V
Lead Channel Setting Pacing Amplitude: 3.5 V
Lead Channel Setting Pacing Pulse Width: 0.4 ms
MDC IDC LEAD IMPLANT DT: 20180927
MDC IDC MSMT BATTERY VOLTAGE: 3.15 V
MDC IDC MSMT LEADCHNL RA IMPEDANCE VALUE: 304 Ohm
MDC IDC MSMT LEADCHNL RA SENSING INTR AMPL: 1.25 mV
MDC IDC MSMT LEADCHNL RA SENSING INTR AMPL: 1.375 mV
MDC IDC MSMT LEADCHNL RV IMPEDANCE VALUE: 418 Ohm
MDC IDC MSMT LEADCHNL RV SENSING INTR AMPL: 10.625 mV
MDC IDC SESS DTM: 20190108202533
MDC IDC SET LEADCHNL RV SENSING SENSITIVITY: 1.2 mV
MDC IDC STAT BRADY RA PERCENT PACED: 0.96 %
MDC IDC STAT BRADY RV PERCENT PACED: 94.98 %

## 2017-06-05 ENCOUNTER — Other Ambulatory Visit: Payer: Self-pay

## 2017-06-05 NOTE — Patient Outreach (Signed)
[facility]) Care Management  06/05/2017  GLENDEL JAGGERS 06-08-1935 [id number]   Mr. Lesley Galentine is showing that he is taking Januvia 100 mg the reason for the call is because he may qualify for Patient's Assistance program spoke with patient's wife she said patient is still taking this medication and would like for [place] to send the forms and she will mail them back with all the information need it.  [facility] Management Direct Dial [phone]  Fax [phone] [email or web address]

## 2017-06-06 ENCOUNTER — Other Ambulatory Visit: Payer: Self-pay | Admitting: Pharmacy Technician

## 2017-06-06 NOTE — Patient Outreach (Signed)
[facility]) Care Management  06/06/2017  ALEKXANDER ISOLA [birth year] [id number]   Received [employer] patient assistance referall from [facility]. Mailed patient and provider portion of application for drug Januvia.  Maud Deed [place], [place] Management [phone]

## 2017-06-10 DIAGNOSIS — R21 Rash and other nonspecific skin eruption: Secondary | ICD-10-CM | POA: Diagnosis not present

## 2017-06-10 DIAGNOSIS — J060 Acute laryngopharyngitis: Secondary | ICD-10-CM | POA: Diagnosis not present

## 2017-06-13 ENCOUNTER — Other Ambulatory Visit: Payer: Self-pay | Admitting: Pharmacy Technician

## 2017-06-13 NOTE — Patient Outreach (Signed)
[facility]) Care Management  06/13/2017  Randall Reyes Feb 14, 1936 [id number]   Unsuccessful outreach call #1 to [name]. There was just a busy signal with not option for voicemail.  Maud Deed [place], [place] Management [phone]

## 2017-06-17 ENCOUNTER — Inpatient Hospital Stay (HOSPITAL_COMMUNITY): Payer: Medicare Other | Attending: Oncology | Admitting: Oncology

## 2017-06-17 ENCOUNTER — Encounter (HOSPITAL_COMMUNITY): Payer: Self-pay | Admitting: Oncology

## 2017-06-17 VITALS — BP 103/65 | HR 69 | Resp 18

## 2017-06-17 DIAGNOSIS — Z79899 Other long term (current) drug therapy: Secondary | ICD-10-CM | POA: Diagnosis not present

## 2017-06-17 DIAGNOSIS — C9190 Lymphoid leukemia, unspecified not having achieved remission: Secondary | ICD-10-CM | POA: Diagnosis not present

## 2017-06-17 DIAGNOSIS — I495 Sick sinus syndrome: Secondary | ICD-10-CM | POA: Insufficient documentation

## 2017-06-17 DIAGNOSIS — E669 Obesity, unspecified: Secondary | ICD-10-CM | POA: Diagnosis not present

## 2017-06-17 DIAGNOSIS — I4892 Unspecified atrial flutter: Secondary | ICD-10-CM | POA: Insufficient documentation

## 2017-06-17 DIAGNOSIS — Z87891 Personal history of nicotine dependence: Secondary | ICD-10-CM | POA: Insufficient documentation

## 2017-06-17 DIAGNOSIS — I129 Hypertensive chronic kidney disease with stage 1 through stage 4 chronic kidney disease, or unspecified chronic kidney disease: Secondary | ICD-10-CM | POA: Diagnosis not present

## 2017-06-17 DIAGNOSIS — B029 Zoster without complications: Secondary | ICD-10-CM | POA: Diagnosis not present

## 2017-06-17 DIAGNOSIS — R21 Rash and other nonspecific skin eruption: Secondary | ICD-10-CM | POA: Diagnosis not present

## 2017-06-17 DIAGNOSIS — N189 Chronic kidney disease, unspecified: Secondary | ICD-10-CM | POA: Insufficient documentation

## 2017-06-17 DIAGNOSIS — E875 Hyperkalemia: Secondary | ICD-10-CM | POA: Diagnosis not present

## 2017-06-17 DIAGNOSIS — Z950 Presence of cardiac pacemaker: Secondary | ICD-10-CM | POA: Diagnosis not present

## 2017-06-17 DIAGNOSIS — C9110 Chronic lymphocytic leukemia of B-cell type not having achieved remission: Secondary | ICD-10-CM | POA: Diagnosis not present

## 2017-06-17 DIAGNOSIS — J9600 Acute respiratory failure, unspecified whether with hypoxia or hypercapnia: Secondary | ICD-10-CM | POA: Diagnosis not present

## 2017-06-17 DIAGNOSIS — E883 Tumor lysis syndrome: Secondary | ICD-10-CM | POA: Diagnosis not present

## 2017-06-17 DIAGNOSIS — Z7901 Long term (current) use of anticoagulants: Secondary | ICD-10-CM | POA: Insufficient documentation

## 2017-06-17 DIAGNOSIS — I442 Atrioventricular block, complete: Secondary | ICD-10-CM | POA: Insufficient documentation

## 2017-06-17 DIAGNOSIS — Z9221 Personal history of antineoplastic chemotherapy: Secondary | ICD-10-CM | POA: Diagnosis not present

## 2017-06-17 DIAGNOSIS — B028 Zoster with other complications: Secondary | ICD-10-CM

## 2017-06-17 DIAGNOSIS — E1122 Type 2 diabetes mellitus with diabetic chronic kidney disease: Secondary | ICD-10-CM | POA: Diagnosis not present

## 2017-06-17 DIAGNOSIS — E538 Deficiency of other specified B group vitamins: Secondary | ICD-10-CM | POA: Insufficient documentation

## 2017-06-17 NOTE — Progress Notes (Signed)
Patient walked in wanting to be seen for possible shingles on face and lip.  Was seen last week by Dr. Nevada Crane and again today for symptoms and was told to come to the [facility] for evaluation.  Reviewed patients history and symptoms with the nurse practitioner with verbal orders for swab and send to lab.  Patient seen by the nurse practitioner.

## 2017-06-17 NOTE — Patient Instructions (Signed)
[facility] at [facility]  Discharge Instructions:  You were seen by the nurse practitioner today.  _______________________________________________________________  Thank you for choosing [facility] at [facility] to provide your oncology and hematology care.  To afford each patient quality time with our providers, please arrive at least 15 minutes before your scheduled appointment.  You need to re-schedule your appointment if you arrive 10 or more minutes late.  We strive to give you quality time with our providers, and arriving late affects you and other patients whose appointments are after yours.  Also, if you no show three or more times for appointments you may be dismissed from the clinic.  Again, thank you for choosing [facility] at [facility]. Our hope is that these requests will allow you access to exceptional care and in a timely manner. _______________________________________________________________  If you have questions after your visit, please contact our office at [phone] between the hours of 8:30 a.m. and 5:00 p.m. Voicemails left after 4:30 p.m. will not be returned until the following business day. _______________________________________________________________  For prescription refill requests, have your pharmacy contact our office. _______________________________________________________________  Recommendations made by the consultant and any test results will be sent to your referring physician. _______________________________________________________________ 

## 2017-06-18 LAB — HERPES SIMPLEX VIRUS(HSV) DNA BY PCR
HSV 1 DNA: POSITIVE — AB
HSV 2 DNA: NEGATIVE

## 2017-06-18 NOTE — Progress Notes (Signed)
Symptom Management Consult note [place]  [phone] [phone]  Patient Care Team: [name], MD as PCP - General (Internal Medicine) Gala Romney Cristopher Estimable, MD as Consulting Physician (Gastroenterology) Fran Lowes, MD as Consulting Physician (Nephrology) Adaline Sill, CPhT as [place] Management (Pharmacy Technician)   Name of the patient: Randall Reyes  [id number]  1936-04-22   Date of visit: 06/20/17  Diagnosis- CLL  Chief complaint/ Reason for visit- Mouth Sores  Heme/Onc history: Randall Reyes 82 y.o. male presents today for continued follow-up with his wife and son. He received cycle 1 day 1 of Gayzva and Chlorambucil on 01/28/17. Rasburicase 3mg  IV was given as well. Patient had tolerated treatment well and had been discharged from our clinic. Later that evening he presented to the [facility] ED for a syncopal episode at home and requiring CPR. While patient was in the ED he had another episode of syncope and was found to be pulseless. He was given another round of CPR and was transferred to [facility] for further management. His WBC on 01/29/2017 was 124.7k but dropped down to 21.3K on 01/30/2017 after he received cycle 1 day 1 of treatment. Patient was intubated for acute respiratory failure with cardiac arrest and transferred to the ICU. A syncopal episode was due to complete heart block. He also experienced a flutter with tachybradycardia with the Chads 2 score of 4. He had a pacemaker placed while he was inpatient and was placed on Eliquis 2.5 mg by mouth twice a day. 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 Patient was discharged from the hospital on 02/08/2017. On the day of discharge WBC was 15.1 K, hemoglobin 9.1 g/dL, hematocrit 27.9, MCV 109.4, platelet count 58K.  Interval history- Patient was last seen by Dr.  Talbert Reyes on 02/19/2017 with his wife and son for follow-up.  He was doing pretty well since he has been discharged from the hospital.  He denies any major complaints.  He admitted to feeling fatigued.  Although his leukocytosis from his CLL responded very well to gayzva and chlorambucil, he unfortunately ended up in the hospital and she does not recommend any further treatment at this time.  They were in agreement with this plan.  He was to receive blood if needed and symptomatic.  His hemoglobin was 7.3 and he was given 2 units packed red blood cells.  He was to return to clinic in 2 months with labs.  Labs from December appeared to be much better hemoglobin was 10.6.    Today he presents for mouth sores that began approximately 1 week ago and have worsened over time.  He was recently seen by his PCP Dr. [place] Reyes and was diagnosed with shingles of the right face and chest.  He was started on acyclovir.  Approximately 6 or 7 days ago the patient noticed sores developing on his lip and inside of his mouth.  They have progressively become worse over the course of this week.  He is unable to eat due to the pain in his mouth.  He has never experienced anything of this nature before.  He has tried warm baking soda to help with the pain and irritation.  He is able to drink fluids without problem.  He was seen again by his PCP this morning and they suggested he come to [place] because this could be "cancer related".  Last chemotherapy was approximately 4 months ago.  He is currently not undergoing any type of treatments at this time.  He denies any changes to his medications.    ECOG FS:0 - Asymptomatic  Review of systems- Review of Systems  Constitutional: Positive for malaise/fatigue (Chronic) and weight loss (Due to inability to eat). Negative for chills and fever.  HENT: Negative.   Eyes: Negative.   Respiratory: Negative for cough, hemoptysis, sputum production, shortness of breath and wheezing.   Cardiovascular:  Negative.   Gastrointestinal: Negative.   Genitourinary: Negative.   Musculoskeletal: Negative.   Skin: Positive for rash (On lip and inside oral cavity). Negative for itching.  Neurological: Positive for weakness (Chronic). Negative for dizziness, tingling, tremors, sensory change and headaches.  Endo/Heme/Allergies: Negative.   Psychiatric/Behavioral: Negative.      Current treatment- Observation at this time   Allergies  Allergen Reactions  . Aspirin Other (See Comments)    Bleeding ulcers.   . Ibuprofen Other (See Comments)    Bleeding ulcers.     Past Medical History:  Diagnosis Date  . Anemia   . B12 deficiency 12/07/2014  . CLL (chronic lymphocytic leukemia) ([place]) 01/24/2011  . Diabetes mellitus   . DM (diabetes mellitus) ([place]) 01/24/2011  . Eczema 01/24/2011  . History of pneumonia 04/2014  . Hypertension   . Leukemia ([place]) 6.22.2012   PER PATIENT  . Melanoma in situ ([place]) 01/24/2011  . Mild obesity 01/24/2011  . Port catheter in place 05/02/2012  . Shingles   . Ulcer      Past Surgical History:  Procedure Laterality Date  . bleeding ulcer    . CATARACT EXTRACTION W/PHACO  05/05/2012   Procedure: CATARACT EXTRACTION PHACO AND INTRAOCULAR LENS PLACEMENT (IOC);  Surgeon: Tonny Branch, MD;  Location: [facility] ORS;  Service: Ophthalmology;  Laterality: Right;  CDE:13.25  . CATARACT EXTRACTION W/PHACO  05/15/2012   Procedure: CATARACT EXTRACTION PHACO AND INTRAOCULAR LENS PLACEMENT (IOC);  Surgeon: Tonny Branch, MD;  Location: [facility] ORS;  Service: Ophthalmology;  Laterality: Left;  CDE:  12.32  . COLONOSCOPY N/A 01/30/2016   Procedure: COLONOSCOPY;  Surgeon: Danie Binder, MD;  Location: [facility] ENDO SUITE;  Service: Endoscopy;  Laterality: N/A;  2:00 PM - moved to 1:00 - office notified pt  . ESOPHAGOGASTRODUODENOSCOPY  2008   Dr. Oneida Alar: normal esophagus, antral erythema, 1 cm clean based duodenal ulcera, negative H. plyori  . ESOPHAGOGASTRODUODENOSCOPY N/A 01/30/2016   Procedure:  ESOPHAGOGASTRODUODENOSCOPY (EGD);  Surgeon: Danie Binder, MD;  Location: [facility];  Service: Endoscopy;  Laterality: N/A;  . HERNIA REPAIR  2001  . PACEMAKER IMPLANT N/A 02/07/2017   Procedure: PACEMAKER IMPLANT;  Surgeon: Constance Haw, MD;  Location: [facility];  Service: Cardiovascular;  Laterality: N/A;  . PORTACATH PLACEMENT  2008    Social History   Socioeconomic History  . Marital status: Married    Spouse name: Not on file  . Number of children: Not on file  . Years of education: Not on file  . Highest education level: Not on file  Social Needs  . Financial resource strain: Not on file  . Food insecurity - worry: Not on file  . Food insecurity - inability: Not on file  . Transportation needs - medical: Not on file  . Transportation needs - non-medical: Not on file  Occupational History  . Not on file  Tobacco Use  . Smoking status: Former Research scientist (life sciences)  . Smokeless tobacco: Former Systems developer  Quit date: 04/25/1967  Substance and Sexual Activity  . Alcohol use: No  . Drug use: No  . Sexual activity: Not on file  Other Topics Concern  . Not on file  Social History Narrative  . Not on file    Family History  Problem Relation Age of Onset  . Diabetes Father   . Colon cancer Neg Hx      Current Outpatient Medications:  .  sevelamer carbonate (RENVELA) 800 MG tablet, Take by mouth., Disp: , Rfl:  .  ACCU-CHEK AVIVA PLUS test strip, , Disp: , Rfl:  .  acetaminophen (TYLENOL) 500 MG tablet, Take 500 mg by mouth at bedtime as needed for mild pain or moderate pain. , Disp: , Rfl:  .  Ascorbic Acid (VITAMIN C PO), Take 1 tablet by mouth daily., Disp: , Rfl:  .  cholecalciferol (VITAMIN D) 400 units TABS tablet, Take 400 Units by mouth daily., Disp: , Rfl:  .  Docusate Calcium (STOOL SOFTENER PO), Take 1 capsule by mouth as needed., Disp: , Rfl:  .  levETIRAcetam (KEPPRA) 100 MG/ML solution, Take 7.5 mLs (750 mg total) by mouth 2 (two) times daily., Disp: 473  mL, Rfl: 12 .  lidocaine-prilocaine (EMLA) cream, Apply a quarter size amount to affected area 1 hour prior to coming to chemotherapy.  Do not rub in.  Cover with plastic wrap., Disp: 30 g, Rfl: 2 .  magic mouthwash w/lidocaine SOLN, Take 5 mLs by mouth 4 (four) times daily as needed for mouth pain., Disp: 480 mL, Rfl: 2 .  potassium chloride SA (K-DUR,KLOR-CON) 20 MEQ tablet, Take 2 tablets (40 mEq total) by mouth 2 (two) times daily., Disp: 14 tablet, Rfl: 0 .  rosuvastatin (CRESTOR) 40 MG tablet, Take 40 mg by mouth at bedtime. , Disp: , Rfl:  .  sevelamer carbonate (RENVELA) 800 MG tablet, Take 2 tablets (1,600 mg total) by mouth 3 (three) times daily with meals. (Patient taking differently: Take 800 mg by mouth 3 (three) times daily with meals. ), Disp: 180 tablet, Rfl: 2 .  sitaGLIPtin (JANUVIA) 100 MG tablet, Take 100 mg by mouth daily., Disp: , Rfl:  .  sodium bicarbonate 650 MG tablet, TAKE 1 TABLET BY MOUTH TWICE A DAY AS DIRECTED, Disp: , Rfl: 3 .  valACYclovir (VALTREX) 500 MG tablet, , Disp: , Rfl:  No current facility-administered medications for this visit.   Facility-Administered Medications Ordered in Other Visits:  .  heparin lock flush 100 unit/mL, 500 Units, Intravenous, Once, Holley Bouche, NP .  sodium chloride flush (NS) 0.9 % injection 10 mL, 10 mL, Intravenous, PRN, Holley Bouche, NP  Physical exam:  Vitals:   06/17/17 1200  BP: 103/65  Pulse: 69  Resp: 18  SpO2: 100%   Physical Exam  Constitutional: He is oriented to person, place, and time and well-developed, well-nourished, and in no distress.  HENT:  Head: Normocephalic and atraumatic.  Mouth/Throat:    Eyes: Conjunctivae are normal. Pupils are equal, round, and reactive to light.  Neck: Normal range of motion. Neck supple.  Cardiovascular: Normal rate and regular rhythm.  Pulmonary/Chest: Effort normal and breath sounds normal.  Abdominal: Soft. Bowel sounds are normal.  Musculoskeletal:  Normal range of motion.  Neurological: He is alert and oriented to person, place, and time.  Skin: Skin is warm. Rash (On lip and inside of mouth) noted.     CMP Latest Ref Rng & Units 06/20/2017  Glucose 65 - 99 mg/dL 120(H)  BUN 6 - 20 mg/dL 29(H)  Creatinine 0.61 - 1.24 mg/dL 2.23(H)  Sodium 135 - 145 mmol/L 133(L)  Potassium 3.5 - 5.1 mmol/L 4.6  Chloride 101 - 111 mmol/L 98(L)  CO2 22 - 32 mmol/L 24  Calcium 8.9 - 10.3 mg/dL 8.9  Total Protein 6.5 - 8.1 g/dL 7.8  Total Bilirubin 0.3 - 1.2 mg/dL 0.5  Alkaline Phos 38 - 126 U/L 119  AST 15 - 41 U/L 21  ALT 17 - 63 U/L 13(L)   CBC Latest Ref Rng & Units 06/20/2017  WBC 4.0 - 10.5 K/uL 128.2(HH)  Hemoglobin 13.0 - 17.0 g/dL 10.8(L)  Hematocrit 39.0 - 52.0 % 33.9(L)  Platelets 150 - 400 K/uL 165    No images are attached to the encounter.  No results found.   Assessment and plan- Patient is a 82 y.o. male who presents for stomatitis.  On examination there are several oral lesions identified on outside of lower lip and inside oral cavity.  His mucous membranes appear dry and exudate is identified on sides of oral cavity.  Patient's vital signs are stable and he is afebrile.   1.  STAT collection of herpes culture.  Results: Positive for HSV 1.  Patient was started on 06/17/2016 by PCP Valtrex 500 mg.  This was per patient report.  We will have him continue this and see if this helps with oral lesions.  Will add Magic mouthwash with lidocaine to help with painful sores and possible oral thrush.  This formulary includes nystatin.  Prescription sent to pharmacy.  Will call patient to follow-up. 2. RTC as scheduled to see Dr. Bangladesh on Wednesday.     Visit Diagnosis 1. CLL (chronic lymphocytic leukemia) ([place])   2. Herpes zoster with complication     Patient expressed understanding and was in agreement with this plan. He also understands that He can call clinic at any time with any questions, concerns, or complaints.   Greater than 50%  was spent in counseling and coordination of care with this patient including but not limited to discussion of the relevant topics above (See A&P) including, but not limited to diagnosis and management of acute and chronic medical conditions.    Faythe Casa, [place]- [phone] Pager- [phone] 06/20/2017 1:33 PM

## 2017-06-19 MED ORDER — VALACYCLOVIR HCL 500 MG PO TABS: ORAL_TABLET | ORAL | 0 refills | Status: DC

## 2017-06-20 ENCOUNTER — Other Ambulatory Visit: Payer: Self-pay

## 2017-06-20 ENCOUNTER — Inpatient Hospital Stay (HOSPITAL_COMMUNITY): Payer: Medicare Other

## 2017-06-20 ENCOUNTER — Other Ambulatory Visit (HOSPITAL_COMMUNITY): Payer: Self-pay | Admitting: *Deleted

## 2017-06-20 ENCOUNTER — Inpatient Hospital Stay (HOSPITAL_BASED_OUTPATIENT_CLINIC_OR_DEPARTMENT_OTHER): Payer: Medicare Other | Admitting: Internal Medicine

## 2017-06-20 ENCOUNTER — Encounter (HOSPITAL_COMMUNITY): Payer: Self-pay | Admitting: Internal Medicine

## 2017-06-20 VITALS — BP 128/74 | HR 86 | Resp 16 | Ht 61.0 in | Wt 133.1 lb

## 2017-06-20 DIAGNOSIS — C9110 Chronic lymphocytic leukemia of B-cell type not having achieved remission: Secondary | ICD-10-CM

## 2017-06-20 DIAGNOSIS — Z7901 Long term (current) use of anticoagulants: Secondary | ICD-10-CM | POA: Diagnosis not present

## 2017-06-20 DIAGNOSIS — I129 Hypertensive chronic kidney disease with stage 1 through stage 4 chronic kidney disease, or unspecified chronic kidney disease: Secondary | ICD-10-CM | POA: Diagnosis not present

## 2017-06-20 DIAGNOSIS — Z79899 Other long term (current) drug therapy: Secondary | ICD-10-CM

## 2017-06-20 DIAGNOSIS — E1122 Type 2 diabetes mellitus with diabetic chronic kidney disease: Secondary | ICD-10-CM | POA: Diagnosis not present

## 2017-06-20 DIAGNOSIS — E538 Deficiency of other specified B group vitamins: Secondary | ICD-10-CM | POA: Diagnosis not present

## 2017-06-20 DIAGNOSIS — N189 Chronic kidney disease, unspecified: Secondary | ICD-10-CM | POA: Diagnosis not present

## 2017-06-20 DIAGNOSIS — E875 Hyperkalemia: Secondary | ICD-10-CM | POA: Diagnosis not present

## 2017-06-20 DIAGNOSIS — Z950 Presence of cardiac pacemaker: Secondary | ICD-10-CM | POA: Diagnosis not present

## 2017-06-20 DIAGNOSIS — J9600 Acute respiratory failure, unspecified whether with hypoxia or hypercapnia: Secondary | ICD-10-CM | POA: Diagnosis not present

## 2017-06-20 DIAGNOSIS — I495 Sick sinus syndrome: Secondary | ICD-10-CM | POA: Diagnosis not present

## 2017-06-20 DIAGNOSIS — B029 Zoster without complications: Secondary | ICD-10-CM | POA: Diagnosis not present

## 2017-06-20 DIAGNOSIS — I442 Atrioventricular block, complete: Secondary | ICD-10-CM | POA: Diagnosis not present

## 2017-06-20 DIAGNOSIS — Z87891 Personal history of nicotine dependence: Secondary | ICD-10-CM | POA: Diagnosis not present

## 2017-06-20 DIAGNOSIS — I4892 Unspecified atrial flutter: Secondary | ICD-10-CM | POA: Diagnosis not present

## 2017-06-20 DIAGNOSIS — Z9221 Personal history of antineoplastic chemotherapy: Secondary | ICD-10-CM | POA: Diagnosis not present

## 2017-06-20 DIAGNOSIS — E883 Tumor lysis syndrome: Secondary | ICD-10-CM | POA: Diagnosis not present

## 2017-06-20 LAB — COMPREHENSIVE METABOLIC PANEL WITH GFR
ALT: 13 U/L — ABNORMAL LOW (ref 17–63)
AST: 21 U/L (ref 15–41)
Albumin: 4.1 g/dL (ref 3.5–5.0)
Alkaline Phosphatase: 119 U/L (ref 38–126)
Anion gap: 11 (ref 5–15)
BUN: 29 mg/dL — ABNORMAL HIGH (ref 6–20)
CO2: 24 mmol/L (ref 22–32)
Calcium: 8.9 mg/dL (ref 8.9–10.3)
Chloride: 98 mmol/L — ABNORMAL LOW (ref 101–111)
Creatinine, Ser: 2.23 mg/dL — ABNORMAL HIGH (ref 0.61–1.24)
GFR calc Af Amer: 31 mL/min — ABNORMAL LOW
GFR calc non Af Amer: 26 mL/min — ABNORMAL LOW
Glucose, Bld: 120 mg/dL — ABNORMAL HIGH (ref 65–99)
Potassium: 4.6 mmol/L (ref 3.5–5.1)
Sodium: 133 mmol/L — ABNORMAL LOW (ref 135–145)
Total Bilirubin: 0.5 mg/dL (ref 0.3–1.2)
Total Protein: 7.8 g/dL (ref 6.5–8.1)

## 2017-06-20 LAB — CBC WITH DIFFERENTIAL/PLATELET
BASOS ABS: 0 10*3/uL (ref 0.0–0.1)
Basophils Relative: 0 %
Eosinophils Absolute: 2.6 10*3/uL — ABNORMAL HIGH (ref 0.0–0.7)
Eosinophils Relative: 2 %
HEMATOCRIT: 33.9 % — AB (ref 39.0–52.0)
Hemoglobin: 10.8 g/dL — ABNORMAL LOW (ref 13.0–17.0)
LYMPHS ABS: 117.9 10*3/uL — AB (ref 0.7–4.0)
Lymphocytes Relative: 92 %
MCH: 35.1 pg — AB (ref 26.0–34.0)
MCHC: 31.9 g/dL (ref 30.0–36.0)
MCV: 110.1 fL — AB (ref 78.0–100.0)
MONOS PCT: 1 %
Monocytes Absolute: 1.3 10*3/uL — ABNORMAL HIGH (ref 0.1–1.0)
NEUTROS ABS: 6.4 10*3/uL (ref 1.7–7.7)
Neutrophils Relative %: 5 %
Platelets: 165 10*3/uL (ref 150–400)
RBC: 3.08 MIL/uL — ABNORMAL LOW (ref 4.22–5.81)
RDW: 13.7 % (ref 11.5–15.5)
WBC: 128.2 10*3/uL (ref 4.0–10.5)

## 2017-06-20 MED ORDER — VALACYCLOVIR HCL 500 MG PO TABS: 500.0000 mg | ORAL_TABLET | Freq: Two times a day (BID) | ORAL | 0 refills | Status: DC

## 2017-06-20 MED ORDER — SODIUM CHLORIDE 0.9% FLUSH
10.0000 mL | INTRAVENOUS | Status: DC | PRN
  Administered 2017-06-20: 10 mL via INTRAVENOUS
  Filled 2017-06-20: qty 10

## 2017-06-20 MED ORDER — MAGIC MOUTHWASH W/LIDOCAINE: 5.0000 mL | Freq: Four times a day (QID) | ORAL | 2 refills | Status: DC | PRN

## 2017-06-20 MED ORDER — HEPARIN SOD (PORK) LOCK FLUSH 100 UNIT/ML IV SOLN
500.0000 [IU] | Freq: Once | INTRAVENOUS | Status: AC
  Administered 2017-06-20: 500 [IU] via INTRAVENOUS

## 2017-06-20 NOTE — Patient Instructions (Signed)
[facility] Discharge Instructions  RECOMMENDATIONS MADE BY THE CONSULTANT AND ANY TEST RESULTS WILL BE SENT TO YOUR REFERRING PHYSICIAN.  Portacath flushed per protocol today. Follow-up as scheduled. Call clinic for any questions or concerns  Thank you for choosing [facility] at [facility] to provide your oncology and hematology care.  To afford each patient quality time with our provider, please arrive at least 15 minutes before your scheduled appointment time.    If you have a lab appointment with the Cancer Center please come in thru the  Main Entrance and check in at the main information desk  You need to re-schedule your appointment should you arrive 10 or more minutes late.  We strive to give you quality time with our providers, and arriving late affects you and other patients whose appointments are after yours.  Also, if you no show three or more times for appointments you may be dismissed from the clinic at the providers discretion.     Again, thank you for choosing [facility].  Our hope is that these requests will decrease the amount of time that you wait before being seen by our physicians.       _____________________________________________________________  Should you have questions after your visit to [facility], please contact our office at [phone] between the hours of 8:30 a.m. and 4:30 p.m.  Voicemails left after 4:30 p.m. will not be returned until the following business day.  For prescription refill requests, have your pharmacy contact our office.       Resources For Cancer Patients and their Caregivers ? American Cancer Society: Can assist with transportation, wigs, general needs, runs Look Good Feel Better.        1-888-227-6333 ? Cancer Care: Provides financial assistance, online support groups, medication/co-pay assistance.  1-800-813-HOPE (4673) ? [facility] Assists [place] cancer patients and their families through emotional , educational and financial support.  336-427-4357 ? [place] DSS Where to apply for food stamps, Medicaid and utility assistance. 336-342-1394 ? [facility]: Transportation to medical appointments. 336-347-2287 ? Social Security Administration: May apply for disability if have a Stage IV cancer. 336-342-7796 1-800-772-1213 ? [place] Aging, Disability and Transit Services: Assists with nutrition, care and transit needs. 336-349-2343  Cancer Center Support Programs: @10RELATIVEDAYS@ > Cancer Support Group  2nd Tuesday of the month 1pm-2pm, Journey Room  > Creative Journey  3rd Tuesday of the month 1130am-1pm, Journey Room  > Look Good Feel Better  1st Wednesday of the month 10am-12 noon, Journey Room (Call American Cancer Society to register 1-800-395-5775)   

## 2017-06-20 NOTE — Progress Notes (Signed)
Randall Reyes tolerated portacath flush well without complaints or incident. Port accessed without blood return but flushed easily without complaints of discomfort or swelling per protocol then de-accessed. Pt discharged self ambulatory in satisfactory condition accompanied by his wife

## 2017-06-20 NOTE — Progress Notes (Signed)
CRITICAL VALUE ALERT  Critical Value:  WBC 128.2  Date & Time Notied:  06/20/17 @1320   Provider Notified: Dr. Sherrine Maples  Orders Received/Actions taken: none at this time

## 2017-06-21 ENCOUNTER — Other Ambulatory Visit: Payer: Self-pay | Admitting: Pharmacy Technician

## 2017-06-21 LAB — IGG, IGA, IGM
IGG (IMMUNOGLOBIN G), SERUM: 1097 mg/dL (ref 700–1600)
IgA: 27 mg/dL — ABNORMAL LOW (ref 61–437)
IgM (Immunoglobulin M), Srm: 17 mg/dL (ref 15–143)

## 2017-06-21 NOTE — Patient Outreach (Signed)
[facility] [facility]) Care Management  06/21/2017  BLUE WINTHER [birth year] [id number]   Successful outreach call to patient wife, Deneise Lever. Mrs. Jayson verified patient HIPAA identifiers as well as was able to state the drug that the patient application is for. Mrs. Pasch stated that she had received it and that she would complete the application and mail it back in.  Maud Deed [place], [place] Management [phone]

## 2017-06-24 ENCOUNTER — Other Ambulatory Visit (HOSPITAL_COMMUNITY): Payer: Self-pay | Admitting: Pharmacist

## 2017-06-26 ENCOUNTER — Other Ambulatory Visit: Payer: Self-pay | Admitting: Pharmacy Technician

## 2017-06-26 NOTE — Patient Outreach (Signed)
[facility]) Care Management  06/26/2017  Randall Reyes 1936-01-19 [id number]   Received patient portion of [employer] application.  Mailing out application 37/16/96  [name], [place] Management [phone]

## 2017-06-27 ENCOUNTER — Ambulatory Visit (HOSPITAL_COMMUNITY): Payer: Medicare Other

## 2017-06-27 ENCOUNTER — Other Ambulatory Visit: Payer: Self-pay

## 2017-06-27 ENCOUNTER — Inpatient Hospital Stay (HOSPITAL_COMMUNITY): Payer: Medicare Other | Admitting: Internal Medicine

## 2017-06-27 ENCOUNTER — Other Ambulatory Visit (HOSPITAL_COMMUNITY): Payer: Medicare Other

## 2017-06-27 ENCOUNTER — Encounter (HOSPITAL_COMMUNITY): Payer: Self-pay | Admitting: Internal Medicine

## 2017-06-27 ENCOUNTER — Inpatient Hospital Stay (HOSPITAL_COMMUNITY): Payer: Medicare Other

## 2017-06-27 VITALS — BP 104/64 | HR 84 | Temp 98.0°F | Resp 16 | Ht 61.0 in | Wt 135.6 lb

## 2017-06-27 DIAGNOSIS — B029 Zoster without complications: Secondary | ICD-10-CM

## 2017-06-27 DIAGNOSIS — Z950 Presence of cardiac pacemaker: Secondary | ICD-10-CM | POA: Diagnosis not present

## 2017-06-27 DIAGNOSIS — E875 Hyperkalemia: Secondary | ICD-10-CM | POA: Diagnosis not present

## 2017-06-27 DIAGNOSIS — J9600 Acute respiratory failure, unspecified whether with hypoxia or hypercapnia: Secondary | ICD-10-CM | POA: Diagnosis not present

## 2017-06-27 DIAGNOSIS — Z9221 Personal history of antineoplastic chemotherapy: Secondary | ICD-10-CM

## 2017-06-27 DIAGNOSIS — I495 Sick sinus syndrome: Secondary | ICD-10-CM | POA: Diagnosis not present

## 2017-06-27 DIAGNOSIS — I129 Hypertensive chronic kidney disease with stage 1 through stage 4 chronic kidney disease, or unspecified chronic kidney disease: Secondary | ICD-10-CM | POA: Diagnosis not present

## 2017-06-27 DIAGNOSIS — I4892 Unspecified atrial flutter: Secondary | ICD-10-CM | POA: Diagnosis not present

## 2017-06-27 DIAGNOSIS — E883 Tumor lysis syndrome: Secondary | ICD-10-CM | POA: Diagnosis not present

## 2017-06-27 DIAGNOSIS — C9110 Chronic lymphocytic leukemia of B-cell type not having achieved remission: Secondary | ICD-10-CM

## 2017-06-27 DIAGNOSIS — Z87891 Personal history of nicotine dependence: Secondary | ICD-10-CM | POA: Diagnosis not present

## 2017-06-27 DIAGNOSIS — Z7901 Long term (current) use of anticoagulants: Secondary | ICD-10-CM | POA: Diagnosis not present

## 2017-06-27 DIAGNOSIS — N189 Chronic kidney disease, unspecified: Secondary | ICD-10-CM | POA: Diagnosis not present

## 2017-06-27 DIAGNOSIS — Z79899 Other long term (current) drug therapy: Secondary | ICD-10-CM | POA: Diagnosis not present

## 2017-06-27 DIAGNOSIS — I442 Atrioventricular block, complete: Secondary | ICD-10-CM | POA: Diagnosis not present

## 2017-06-27 DIAGNOSIS — E1122 Type 2 diabetes mellitus with diabetic chronic kidney disease: Secondary | ICD-10-CM | POA: Diagnosis not present

## 2017-06-27 DIAGNOSIS — E538 Deficiency of other specified B group vitamins: Secondary | ICD-10-CM | POA: Diagnosis not present

## 2017-06-27 LAB — COMPREHENSIVE METABOLIC PANEL
ALK PHOS: 104 U/L (ref 38–126)
ALT: 11 U/L — AB (ref 17–63)
AST: 22 U/L (ref 15–41)
Albumin: 3.6 g/dL (ref 3.5–5.0)
Anion gap: 10 (ref 5–15)
BILIRUBIN TOTAL: 0.2 mg/dL — AB (ref 0.3–1.2)
BUN: 21 mg/dL — AB (ref 6–20)
CALCIUM: 8.7 mg/dL — AB (ref 8.9–10.3)
CO2: 25 mmol/L (ref 22–32)
CREATININE: 2.08 mg/dL — AB (ref 0.61–1.24)
Chloride: 104 mmol/L (ref 101–111)
GFR, EST AFRICAN AMERICAN: 33 mL/min — AB (ref >60)
GFR, EST NON AFRICAN AMERICAN: 29 mL/min — AB (ref >60)
Glucose, Bld: 126 mg/dL — ABNORMAL HIGH (ref 65–99)
Potassium: 4.2 mmol/L (ref 3.5–5.1)
Sodium: 139 mmol/L (ref 135–145)
Total Protein: 6.8 g/dL (ref 6.5–8.1)

## 2017-06-27 LAB — CBC WITH DIFFERENTIAL/PLATELET
BASOS ABS: 0 10*3/uL (ref 0.0–0.1)
BASOS PCT: 0 %
Band Neutrophils: 0 %
Blasts: 0 %
Eosinophils Absolute: 0 10*3/uL (ref 0.0–0.7)
Eosinophils Relative: 0 %
HCT: 31.6 % — ABNORMAL LOW (ref 39.0–52.0)
Hemoglobin: 9.9 g/dL — ABNORMAL LOW (ref 13.0–17.0)
LYMPHS ABS: 114.8 10*3/uL — AB (ref 0.7–4.0)
Lymphocytes Relative: 99 %
MCH: 35 pg — ABNORMAL HIGH (ref 26.0–34.0)
MCHC: 31.3 g/dL (ref 30.0–36.0)
MCV: 111.7 fL — AB (ref 78.0–100.0)
METAMYELOCYTES PCT: 0 %
MONO ABS: 0 10*3/uL — AB (ref 0.1–1.0)
MYELOCYTES: 0 %
Monocytes Relative: 0 %
NEUTROS PCT: 1 %
NRBC: 0 /100{WBCs}
Neutro Abs: 1.2 10*3/uL — ABNORMAL LOW (ref 1.7–7.7)
Other: 0 %
PLATELETS: 161 10*3/uL (ref 150–400)
Promyelocytes Absolute: 0 %
RBC: 2.83 MIL/uL — AB (ref 4.22–5.81)
RDW: 13.9 % (ref 11.5–15.5)
WBC: 116 10*3/uL — AB (ref 4.0–10.5)

## 2017-06-27 NOTE — Progress Notes (Signed)
[place] follow up  CC: F/U for CLL  HPI:  Please refer to note by Faythe Casa from 06/17/17 and Dr. Laverle Patter note from 04/19/18 for details of his treatment history Patient is currently on observation only after having attempted treatmeny with muitiple agents including BR, Ibrutinib, Zydelig and recently Gazyva and Chloambucil, all times, he suffered severe adverse effects with treatment despite significant dose modification.  He was seen last week for herpes simplex stomatitis, is currnetly on Valtrex He c/o fatigue, no cough no abdominal pian, inability to gain weight No fever, skin rashes    Oncology history:    CLL (chronic lymphocytic leukemia) ([place])   02/14/2005 Procedure    Peripheral flow cytometry- CLL (without 11q or 17 p deletion) presenting with stage 0 disease      10/25/2008 Progression    Increasing WBC and symptoms suggestive of progressive disease      11/09/2008 - 01/07/2009 Chemotherapy    Rituxan, fludarabine, cytoxan x 3 cycles with the discontinuation of fludarabine and cytoxan due to severe pancytopenia requiring hospitalization.      01/10/2009 Adverse Reaction    Severe pancytopenia and adverse side effects      02/01/2009 - 04/01/2009 Chemotherapy    Rituxan every 21 days x 3 cycles      04/02/2009 Remission    Stable disease.  Chemotherapy holiday started      06/22/2013 - 06/29/2013 Chemotherapy    Imbruvica daily      06/29/2013 Adverse Reaction    Large right posterior leg (popliteal) ecchymosis/violaceous rash.  Likely Imbruvica-induced      07/31/2013 - 10/12/2013 Chemotherapy    Zydelig (idelalisib) 150 mg BID monotherapy      10/12/2013 Adverse Reaction    Transaminitis, suspected to be secondary to [place].  Dose decreased.      10/12/2013 - 11/09/2013 Chemotherapy    Zydelig (idelalisib) 150 mg daily.      11/09/2013 Adverse Reaction    Worsening transaminitis.  Zydelig on HOLD      11/09/2013 Treatment Plan Change     Hold Zydelig      12/28/2013 Adverse Reaction    Herpes Zoster outbreak.  Partial response with Famvir x 1 week, switched to acyclovir.  Progression over 1-2 weeks with development of keratoconjunctivitis bilaterally.  VZ Immune Globulin 625 mg IM given on 01/13/14.      02/08/2014 Treatment Plan Change    Ibrutinib restarted 140 mg daily for one week followed by 280 mg daily followed by 420 mg daily with prophylactic acyclovir.      02/08/2014 - 07/05/2014 Chemotherapy    Ibrutinib 420 mg daily      101-02-2014 - 04/17/2014 Hospital Admission    Right lower lobe pneumonia      07/05/2014 Adverse Reaction    Fissuring of fingertips and sore, red feet with transaminitis      07/05/2014 Treatment Plan Change    Hold Ibrutinib      07/22/2014 - 08/19/2014 Chemotherapy    Ibrutinib restarted at 280 mg daily.      08/09/2014 - 08/12/2014 Hospital Admission    Bilateral pneumonia      08/19/2014 Adverse Reaction    Nausea with vomiting.  Escorted to ED.      08/19/2014 Treatment Plan Change    Ibrutinib on hold- ED escort from clinic       10/18/2014 Miscellaneous    B12 weekly x 4 then monthly.      12/15/2015 Imaging  Bone density- BMD as determined from Femur Neck Left is 0.803 g/cm2 with a T-score of -2.1. This patient is considered osteopenic by World Healh Organization (WHO) Criteria.      12/16/2015 Imaging    CT abd/pelvis-  Progression of retroperitoneal and portacaval adenopathy compared to the prior study. Likely related to CLL.  Small hiatal hernia  Mild compression fractures T11 and T12, probably old. Correlate with any pain in this area.      05/22/2016 - 05/28/2016 Chemotherapy    BR day 1 and 2 x 1 cycle.  Bendamustine dose reduced by 50%.      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      01/28/2017 -  Chemotherapy    Cycle 1 day 1 of Gayzva and Chlorambucil. Rasburicase 3 mg given also.       01/28/2017 - 02/08/2017 Hospital Admission    Admitted for sycopal episode due to complete heart block. Patient went into PEA arrest requiring CPR and intubation. 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 Pacemaker placed while inpatient. Started on eliquis.       HISTORY OF PRESENTING ILLNESS: ROS as per HPI otherwise 12 point ROS is negative.  MEDICAL HISTORY: Past Medical History:  Diagnosis Date  . Anemia   . B12 deficiency 12/07/2014  . CLL (chronic lymphocytic leukemia) ([place]) 01/24/2011  . Diabetes mellitus   . DM (diabetes mellitus) ([place]) 01/24/2011  . Eczema 01/24/2011  . History of pneumonia 04/2014  . Hypertension   . Leukemia ([place]) 6.22.2012   PER PATIENT  . Melanoma in situ ([place]) 01/24/2011  . Mild obesity 01/24/2011  . Port catheter in place 05/02/2012  . Shingles   . Ulcer      PHYSICAL EXAMINATION:  ECOG PERFORMANCE STATUS: 2 - Symptomatic, <50% confined to bed   Vitals:   06/20/17 1431  BP: 128/74  Pulse: 86  Resp: 16  SpO2: 96%    Filed Weights   06/20/17 1431  Weight: 133 lb 1.6 oz (60.4 kg)     Physical Exam Thin built, mild distress due to mouth ulcers. AAOX3  Vitals:   06/20/17 1431  Weight: 133 lb 1.6 oz (60.4 kg)  Height: 5\' 1"  (1.549 m)    ASSESSMENT/PLAN CLL with progressive leukocytosis.  Anemia and thrombocytopenia due to underlying CLL  PLAN: CLL with rising lymphocyte count, and progressive abdominal adenopathy, frequent herpes infections with multiple recurrences Currently on Valcyclovir for acute Herpes Simplex stomatiits, this is improving slowly, continue chronic suppressive therapy with Valtrex, dose confirmed. Reviewed his chart breifly today and noted Dr. Francie Massing note He had previously respsonded extremely well to [facility] with chloramabucil  but had issues with tumor lysis and hospitalization He had a cardiac arrest with PEA due to complete heart block. He has a permanent pacemaker now and is followed closely by cardiology.  It is unlclear as to how this is related to the treatment given. Previously , he went to tumor lysis and ARF requiring dialysis after treatment with Bendamustine and Rituxan. As such, it was felt that he is high risk to rechallenge him. At the moment, I think we will continue to observe off treatment.  In the meantime, I will look into his IgG levels and see if he would benefit from IVIG infusion Will continue to transfuse as  needed. He will returns next week to review results and start IVIG if levels are confirmed to be low. I believe that this patient may be served well at an academic center. It is extremely important that he has continuity of care with providers. His care here at [facility] appears to be some what fragmented due to provider turnover. Unfortunately, I will also have to pass his care over to the next locum physician coming on board.  I think it is reasonable to refer to [facility] if patient wishes to pursue.   - Orders Placed This Encounter  Procedures  . IgG, IgA, IgM    Standing Status:   Future    Number of Occurrences:   1    Standing Expiration Date:   06/20/2018     Creola Corn, MD  06/27/2017 9:14 AM

## 2017-06-27 NOTE — Progress Notes (Signed)
CRITICAL VALUE ALERT Critical value received: WBC- 116 Date of notification:  06/27/17 Time of notification: 1000 Critical value read back:  Yes.   Nurse who received alert:  M.Concha Sudol, LPN MD notified (1st page):  Carloyn Manner, MD (by Isidoro Donning, RN)

## 2017-06-27 NOTE — Patient Instructions (Addendum)
[place] at [facility] Discharge Instructions  RECOMMENDATIONS MADE BY THE CONSULTANT AND ANY TEST RESULTS WILL BE SENT TO YOUR REFERRING PHYSICIAN.  We want to make sure you stay on your Valtrex for at least 4 more weeks to help clear up the herpes.   Labs reviewed in office. Copy of labs given. Your WBCs have come down some.   We will continue to monitor your labs.   Return in 4 weeks for labs and MD visit  Please purchase the gummy vitamins for men - take 1 a day.   Thank you for choosing [place] at [facility] to provide your oncology and hematology care.  To afford each patient quality time with our provider, please arrive at least 15 minutes before your scheduled appointment time.    If you have a lab appointment with the [facility] please come in thru the  Main Entrance and check in at the main information desk  You need to re-schedule your appointment should you arrive 10 or more minutes late.  We strive to give you quality time with our providers, and arriving late affects you and other patients whose appointments are after yours.  Also, if you no show three or more times for appointments you may be dismissed from the clinic at the providers discretion.     Again, thank you for choosing [facility].  Our hope is that these requests will decrease the amount of time that you wait before being seen by our physicians.       _____________________________________________________________  Should you have questions after your visit to [facility], please contact our office at [phone] between the hours of 8:30 a.m. and 4:30 p.m.  Voicemails left after 4:30 p.m. will not be returned until the following business day.  For prescription refill requests, have your pharmacy contact our office.       Resources For Cancer Patients and their Caregivers ? American Cancer Society: Can assist with  transportation, wigs, general needs, runs Look Good Feel Better.        201-128-7572 ? Cancer Care: Provides financial assistance, online support groups, medication/co-pay assistance.  1-800-813-HOPE 332-600-5081) ? [place] Assists [place] cancer patients and their families through emotional , educational and financial support.  ([phone] 323-9961 ? [place] DSS Where to apply for food stamps, Medicaid and utility assistance. 437-209-5097 ? RCATS: Transportation to medical appointments. (616)689-0258 ? Social Security Administration: May apply for disability if have a Stage IV cancer. ([phone] (330)357-3731 ? [employer], Disability and Transit Services: Assists with nutrition, care and transit needs. [place] Support Programs: @10RELATIVEDAYS @ > Cancer Support Group  2nd Tuesday of the month 1pm-2pm, Journey Room  > Creative Journey  3rd Tuesday of the month 1130am-1pm, Journey Room  > Look Good Feel Better  1st Wednesday of the month 10am-12 noon, Journey Room ([place] to register [phone])

## 2017-07-09 DIAGNOSIS — D509 Iron deficiency anemia, unspecified: Secondary | ICD-10-CM | POA: Diagnosis not present

## 2017-07-09 DIAGNOSIS — N189 Chronic kidney disease, unspecified: Secondary | ICD-10-CM | POA: Diagnosis not present

## 2017-07-09 DIAGNOSIS — C9110 Chronic lymphocytic leukemia of B-cell type not having achieved remission: Secondary | ICD-10-CM | POA: Diagnosis not present

## 2017-07-09 DIAGNOSIS — R7301 Impaired fasting glucose: Secondary | ICD-10-CM | POA: Diagnosis not present

## 2017-07-09 DIAGNOSIS — E1165 Type 2 diabetes mellitus with hyperglycemia: Secondary | ICD-10-CM | POA: Diagnosis not present

## 2017-07-09 NOTE — Progress Notes (Signed)
Diagnosis CLL (chronic lymphocytic leukemia) ([place]) - Plan: CBC with Differential/Platelet, Comprehensive metabolic panel, Lactate dehydrogenase  Staging Cancer Staging No matching staging information was found for the patient.  Assessment and Plan:  1.  CLL.  Patient is currently on observation after having attempted treatment with muitiple agents including BR, Ibrutinib, Zydelig,  Gazyva and Chloambucil, all times, he suffered severe adverse effects with treatment despite significant dose modification.   Labs done today WBC 116,000, HB 10 Plts 161,000.  He will RTC in 1 month for repeat labs.  He is asymptomatic presently.  He will continue to be followed closely with repeat CBCs.    2.  Shingles.  He was noted to have a rash on mouth and lips.  He is on Valtrex.  Lesions are improving.  He will continue Valtrex until he is seen for follow-up.    3.  Hypertension.  BP is 104/64.  Continue to follow-up with PCP.  4.  B12 Deficiency.  Continue B12 monthly as previously recommended.    5.  CKD.  Cr 2.  Will repeat chemistries on RTC.      Interval History:  82 y.o. male previously seen by Dr. Talbert Cage and treated with cycle 1 day 1 of Gayzva and Chlorambucil on 01/28/17. Rasburicase 57m IV was given as well. Patient had tolerated treatment well and had been discharged from our clinic. [facility] that [facility] and requiring CPR. While patient was in the ED he had another episode of syncope and was found to be pulseless. [facility] was given another round of CPR [facility]. His WBC on 01/29/2017 was 124.7k but dropped down to 21.3K on 01/30/2017 after he received cycle 1 day 1 of treatment. Patient was intubated for acute respiratory failure with cardiac arrest and transferred to the ICU. A syncopal episode was due to complete heart block. He also experienced a flutter with tachybradycardia with  the Chads 2 score of 4. He had a pacemaker placed while he was inpatient and was placed on Eliquis 2.5 mg by mouth twice a day. 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 Patient was discharged from the hospital on 02/08/2017. On the day of discharge WBC was 15.1 K, hemoglobin 9.1 g/dL, hematocrit 27.9, MCV 109.4, platelet count 58K.  Patient is currently on observation after having attempted treatment with muitiple agents including BR, Ibrutinib, Zydelig,  Gazyva and Chloambucil, all times, he suffered severe adverse effects with treatment despite significant dose modification.   Current Status:  Pt was recently diagnosed with herpes simplex stomatitis, is currnetly on Valtrex.  He reports lesions continue to improve.  He denies any fevers, chills, night sweats and has noted no adenopathy.       CLL (chronic lymphocytic leukemia) ([place]   02/14/2005 Procedure    Peripheral flow cytometry- CLL (without 11q or 17 p deletion) presenting with stage 0 disease      10/25/2008 Progression    Increasing WBC and symptoms suggestive of progressive disease      11/09/2008 - 01/07/2009 Chemotherapy    Rituxan, fludarabine, cytoxan x 3 cycles with the discontinuation of fludarabine and cytoxan due to severe pancytopenia requiring hospitalization.      01/10/2009 Adverse Reaction    Severe pancytopenia and adverse side effects      02/01/2009 - 04/01/2009 Chemotherapy  Rituxan every 21 days x 3 cycles      04/02/2009 Remission    Stable disease.  Chemotherapy holiday started      06/22/2013 - 06/29/2013 Chemotherapy    Imbruvica daily      06/29/2013 Adverse Reaction    Large right posterior leg (popliteal) ecchymosis/violaceous rash.  Likely Imbruvica-induced      07/31/2013 - 10/12/2013 Chemotherapy    Zydelig (idelalisib) 150 mg BID monotherapy      10/12/2013 Adverse Reaction    Transaminitis, suspected to be secondary to  [place].  Dose decreased.      10/12/2013 - 11/09/2013 Chemotherapy    Zydelig (idelalisib) 150 mg daily.      11/09/2013 Adverse Reaction    Worsening transaminitis.  Zydelig on HOLD      11/09/2013 Treatment Plan Change    Hold Zydelig      12/28/2013 Adverse Reaction    Herpes Zoster outbreak.  Partial response with Famvir x 1 week, switched to acyclovir.  Progression over 1-2 weeks with development of keratoconjunctivitis bilaterally.  VZ Immune Globulin 625 mg IM given on 01/13/14.      02/08/2014 Treatment Plan Change    Ibrutinib restarted 140 mg daily for one week followed by 280 mg daily followed by 420 mg daily with prophylactic acyclovir.      02/08/2014 - 07/05/2014 Chemotherapy    Ibrutinib 420 mg daily      [id number] - 04/17/2014 Hospital Admission    Right lower lobe pneumonia      07/05/2014 Adverse Reaction    Fissuring of fingertips and sore, red feet with transaminitis      07/05/2014 Treatment Plan Change    Hold Ibrutinib      07/22/2014 - 08/19/2014 Chemotherapy    Ibrutinib restarted at 280 mg daily.      08/09/2014 - 08/12/2014 Hospital Admission    Bilateral pneumonia      08/19/2014 Adverse Reaction    Nausea with vomiting.  Escorted to ED.      08/19/2014 Treatment Plan Change    Ibrutinib on hold- ED escort from clinic       10/18/2014 Miscellaneous    B12 weekly x 4 then monthly.      12/15/2015 Imaging    Bone density- BMD as determined from Femur Neck Left is 0.803 g/cm2 with a T-score of -2.1. This patient is considered osteopenic by World Healh Organization (WHO) Criteria.      12/16/2015 Imaging    CT abd/pelvis-  Progression of retroperitoneal and portacaval adenopathy compared to the prior study. Likely related to CLL.  Small hiatal hernia  Mild compression fractures T11 and T12, probably old. Correlate with any pain in this area.      05/22/2016 - 05/28/2016 Chemotherapy    BR day 1 and 2 x 1 cycle.  Bendamustine dose reduced by  50%.      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      01/28/2017 -  Chemotherapy    Cycle 1 day 1 of Gayzva and Chlorambucil. Rasburicase 3 mg given also.       01/28/2017 - 02/08/2017 Hospital Admission    Admitted for sycopal episode due to complete heart block. Patient went into PEA arrest requiring CPR and intubation. 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 Pacemaker placed  while inpatient. Started on eliquis.       Problem List Patient Active Problem List   Diagnosis Date Noted  . Typical atrial flutter (HCC) [I48.3]   . Bradycardia [R00.1]   . Cardiopulmonary arrest ([place]) [I46.9]   . Acute respiratory failure ([place]) [J96.00]   . Metabolic acidosis [N46.2]   . Encounter for central line placement [Z45.2]   . Encounter for imaging study to confirm orogastric (OG) tube placement [Z01.89]   . Syncope [R55] 01/28/2017  . 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 Bigeminy [I49.9] 05/23/2016  . Goals of care, counseling/discussion [Z71.89] 05/22/2016  . Anemia [D64.9] 01/20/2016  . Special screening for malignant neoplasms, colon [Z12.11] 01/20/2016  . HTN (hypertension) [I10] 02/01/2015  . B12 deficiency [E53.8] 12/07/2014  . Malnutrition of moderate degree ([place]) [E44.0] 08/11/2014  . AKI (acute kidney injury) ([place]) [N17.9] 08/10/2014  . Hyperkalemia [E87.5] 08/10/2014  . Leukocytosis [D72.829] [id number]  . Port catheter in place [facility] 05/02/2012  . CLL (chronic lymphocytic leukemia) ([place]) [C91.90] 01/24/2011  . Melanoma in situ ([place]) [D03.9] 01/24/2011  . DM (diabetes mellitus) ([place]) [E11.9] 01/24/2011  . Mild obesity [E66.9] 01/24/2011  . Eczema [L30.9] 01/24/2011    Past Medical History Past Medical History:  Diagnosis  Date  . Anemia   . B12 deficiency 12/07/2014  . CLL (chronic lymphocytic leukemia) ([place]) 01/24/2011  . Diabetes mellitus   . DM (diabetes mellitus) ([place]) 01/24/2011  . Eczema 01/24/2011  . History of pneumonia 04/2014  . Hypertension   . Leukemia ([place]) 6.22.2012   PER PATIENT  . Melanoma in situ ([place]) 01/24/2011  . Mild obesity 01/24/2011  . Port catheter in place 05/02/2012  . Shingles   . Ulcer     Past Surgical History Past Surgical History:  Procedure Laterality Date  . bleeding ulcer    . CATARACT EXTRACTION W/PHACO  05/05/2012   Procedure: CATARACT EXTRACTION PHACO AND INTRAOCULAR LENS PLACEMENT (IOC);  Surgeon: Tonny Branch, MD;  Location: [facility] ORS;  Service: Ophthalmology;  Laterality: Right;  CDE:13.25  . CATARACT EXTRACTION W/PHACO  05/15/2012   Procedure: CATARACT EXTRACTION PHACO AND INTRAOCULAR LENS PLACEMENT (IOC);  Surgeon: Tonny Branch, MD;  Location: [facility] ORS;  Service: Ophthalmology;  Laterality: Left;  CDE:  12.32  . COLONOSCOPY N/A 01/30/2016   Procedure: COLONOSCOPY;  Surgeon: Danie Binder, MD;  Location: [facility] ENDO SUITE;  Service: Endoscopy;  Laterality: N/A;  2:00 PM - moved to 1:00 - office notified pt  . ESOPHAGOGASTRODUODENOSCOPY  2008   Dr. Oneida Alar: normal esophagus, antral erythema, 1 cm clean based duodenal ulcera, negative H. plyori  . ESOPHAGOGASTRODUODENOSCOPY N/A 01/30/2016   Procedure: ESOPHAGOGASTRODUODENOSCOPY (EGD);  Surgeon: Danie Binder, MD;  Location: [facility];  Service: Endoscopy;  Laterality: N/A;  . HERNIA REPAIR  2001  . PACEMAKER IMPLANT N/A 02/07/2017   Procedure: PACEMAKER IMPLANT;  Surgeon: Constance Haw, MD;  Location: [place] CV LAB;  Service: Cardiovascular;  Laterality: N/A;  . PORTACATH PLACEMENT  2008    Family History Family History  Problem Relation Age of Onset  . Diabetes Father   . Colon cancer Neg Hx      Social History  reports that he has quit smoking. He quit smokeless tobacco use about 50 years ago. He reports  that he does not drink alcohol or use drugs.  Medications  Current Outpatient Medications:  .  ACCU-CHEK AVIVA PLUS test strip, , Disp: , Rfl:  .  acetaminophen (TYLENOL) 500 MG tablet, Take 500  mg by mouth at bedtime as needed for mild pain or moderate pain. , Disp: , Rfl:  .  Ascorbic Acid (VITAMIN C PO), Take 1 tablet by mouth daily., Disp: , Rfl:  .  cholecalciferol (VITAMIN D) 400 units TABS tablet, Take 400 Units by mouth daily., Disp: , Rfl:  .  Docusate Calcium (STOOL SOFTENER PO), Take 1 capsule by mouth as needed., Disp: , Rfl:  .  levETIRAcetam (KEPPRA) 100 MG/ML solution, Take 7.5 mLs (750 mg total) by mouth 2 (two) times daily., Disp: 473 mL, Rfl: 12 .  magic mouthwash w/lidocaine SOLN, Take 5 mLs by mouth 4 (four) times daily as needed for mouth pain., Disp: 480 mL, Rfl: 2 .  rosuvastatin (CRESTOR) 40 MG tablet, Take 40 mg by mouth at bedtime. , Disp: , Rfl:  .  sevelamer carbonate (RENVELA) 800 MG tablet, Take 2 tablets (1,600 mg total) by mouth 3 (three) times daily with meals. (Patient taking differently: Take 800 mg by mouth 3 (three) times daily with meals. ), Disp: 180 tablet, Rfl: 2 .  sevelamer carbonate (RENVELA) 800 MG tablet, Take by mouth., Disp: , Rfl:  .  sitaGLIPtin (JANUVIA) 100 MG tablet, Take 100 mg by mouth daily., Disp: , Rfl:  .  sodium bicarbonate 650 MG tablet, TAKE 1 TABLET BY MOUTH TWICE A DAY AS DIRECTED, Disp: , Rfl: 3 .  valACYclovir (VALTREX) 500 MG tablet, Take 1 tablet (500 mg total) by mouth 2 (two) times daily., Disp: 180 tablet, Rfl: 0 .  lidocaine-prilocaine (EMLA) cream, Apply a quarter size amount to affected area 1 hour prior to coming to chemotherapy.  Do not rub in.  Cover with plastic wrap. (Patient not taking: Reported on 06/27/2017), Disp: 30 g, Rfl: 2 No current facility-administered medications for this visit.   Facility-Administered Medications Ordered in Other Visits:  .  heparin lock flush 100 unit/mL, 500 Units, Intravenous, Once,  Holley Bouche, NP .  sodium chloride flush (NS) 0.9 % injection 10 mL, 10 mL, Intravenous, PRN, Holley Bouche, NP  Allergies Aspirin and Ibuprofen  Review of Systems Review of Systems - Oncology ROS as per HPI otherwise 12 point ROS is negative.   Physical Exam  Vitals Wt Readings from Last 3 Encounters:  06/27/17 135 lb 9.6 oz (61.5 kg)  06/20/17 133 lb 1.6 oz (60.4 kg)  05/21/17 143 lb (64.9 kg)   Temp Readings from Last 3 Encounters:  06/27/17 98 F (36.7 C) (Oral)  04/19/17 97.8 F (36.6 C) (Oral)   BP Readings from Last 3 Encounters:  06/27/17 104/64  06/20/17 128/74  06/17/17 103/65   Pulse Readings from Last 3 Encounters:  06/27/17 84  06/20/17 86  06/17/17 69   Constitutional: Well-developed, well-nourished, and in no distress.   HENT: Head: Normocephalic and atraumatic. Rash on mouth and lips improved.   Mouth/Throat: No oropharyngeal exudate. Mucosa moist. Eyes: Pupils are equal, round, and reactive to light. Conjunctivae are normal. No scleral icterus.  Neck: Normal range of motion. Neck supple. No JVD present.  Cardiovascular: Normal rate, regular rhythm and normal heart sounds.  Exam reveals no gallop and no friction rub.   No murmur heard. Pulmonary/Chest: Effort normal and breath sounds normal. No respiratory distress. No wheezes.No rales.  Abdominal: Soft. Bowel sounds are normal. No distension. There is no tenderness. There is no guarding.  Musculoskeletal: No edema or tenderness.  Lymphadenopathy: No cervical, axillary or supraclavicular adenopathy.  Neurological: Alert and oriented to person, place, and time. No  cranial nerve deficit.  Skin: Skin is warm and dry. No rash noted. No erythema. No pallor.  Psychiatric: Affect and judgment normal.   Labs Lab on 06/27/2017  Component Date Value Ref Range Status  . Sodium 06/27/2017 139  135 - 145 mmol/L Final  . Potassium 06/27/2017 4.2  3.5 - 5.1 mmol/L Final  . Chloride 06/27/2017 104   101 - 111 mmol/L Final  . CO2 06/27/2017 25  22 - 32 mmol/L Final  . Glucose, Bld 06/27/2017 126* 65 - 99 mg/dL Final  . BUN 06/27/2017 21* 6 - 20 mg/dL Final  . Creatinine, Ser 06/27/2017 2.08* 0.61 - 1.24 mg/dL Final  . Calcium 06/27/2017 8.7* 8.9 - 10.3 mg/dL Final  . Total Protein 06/27/2017 6.8  6.5 - 8.1 g/dL Final  . Albumin 06/27/2017 3.6  3.5 - 5.0 g/dL Final  . AST 06/27/2017 22  15 - 41 U/L Final  . ALT 06/27/2017 11* 17 - 63 U/L Final  . Alkaline Phosphatase 06/27/2017 104  38 - 126 U/L Final  . Total Bilirubin 06/27/2017 0.2* 0.3 - 1.2 mg/dL Final  . GFR calc non Af Amer 06/27/2017 29* >60 mL/min Final  . GFR calc Af Amer 06/27/2017 33* >60 mL/min Final   Comment: (NOTE) The eGFR has been calculated using the CKD EPI equation. This calculation has not been validated in all clinical situations. eGFR's persistently <60 mL/min signify possible Chronic Kidney Disease.   Georgiann Hahn gap 06/27/2017 10  5 - 15 Final   Performed at [facility], [address], [place], [place] [address]  . WBC 06/27/2017 116.0* 4.0 - 10.5 K/uL Final   Comment: REPEATED TO VERIFY CRITICAL RESULT CALLED TO, READ BACK BY AND VERIFIED WITH: MBARNES AT 1000 BY HFLYNT 06/27/17   . RBC 06/27/2017 2.83* 4.22 - 5.81 MIL/uL Final  . Hemoglobin 06/27/2017 9.9* 13.0 - 17.0 g/dL Final  . HCT 06/27/2017 31.6* 39.0 - 52.0 % Final  . MCV 06/27/2017 111.7* 78.0 - 100.0 fL Final  . MCH 06/27/2017 35.0* 26.0 - 34.0 pg Final  . MCHC 06/27/2017 31.3  30.0 - 36.0 g/dL Final  . RDW 06/27/2017 13.9  11.5 - 15.5 % Final  . Platelets 06/27/2017 161  150 - 400 K/uL Final  . Neutrophils Relative % 06/27/2017 1  % Final  . Lymphocytes Relative 06/27/2017 99  % Final  . Monocytes Relative 06/27/2017 0  % Final  . Eosinophils Relative 06/27/2017 0  % Final  . Basophils Relative 06/27/2017 0  % Final  . Band Neutrophils 06/27/2017 0  % Final  . Metamyelocytes Relative 06/27/2017 0  % Final  . Myelocytes 06/27/2017 0  %  Final  . Promyelocytes Absolute 06/27/2017 0  % Final  . Blasts 06/27/2017 0  % Final  . nRBC 06/27/2017 0  0 /100 WBC Final  . Other 06/27/2017 0  % Corrected  . Neutro Abs 06/27/2017 1.2* 1.7 - 7.7 K/uL Final  . Lymphs Abs 06/27/2017 114.8* 0.7 - 4.0 K/uL Final  . Monocytes Absolute 06/27/2017 0.0* 0.1 - 1.0 K/uL Final  . Eosinophils Absolute 06/27/2017 0.0  0.0 - 0.7 K/uL Final  . Basophils Absolute 06/27/2017 0.0  0.0 - 0.1 K/uL Final  . RBC Morphology 06/27/2017 MACROCYTES   Final  . WBC Morphology 06/27/2017 ATYPICAL LYMPHOCYTES   Final   Comment: SMUDGE CELLS WHITE COUNT CONFIRMED ON SMEAR Performed at [facility], [address] [place], [place] [address]      Pathology Orders Placed  This Encounter  Procedures  . CBC with Differential/Platelet    Standing Status:   Future    Standing Expiration Date:   06/27/2018  . Comprehensive metabolic panel    Standing Status:   Future    Standing Expiration Date:   06/27/2018  . Lactate dehydrogenase    Standing Status:   Future    Standing Expiration Date:   06/27/2018       Zoila Shutter MD

## 2017-07-10 ENCOUNTER — Other Ambulatory Visit: Payer: Self-pay | Admitting: Pharmacy Technician

## 2017-07-10 NOTE — Patient Outreach (Signed)
[place] [facility]) Care Management  07/10/2017  Randall Reyes 10-29-1935 [id number]  Unsuccessful outreach call #1 to patients wife, Randall Reyes, whom is assisting me with patients [employer] application. Left HIPAA appropriate voicemail. Contacted Randall Reyes in reference to attestation letter mailed from [employer] on 02/26.  Maud Deed [place], [place] Management [phone]

## 2017-07-11 ENCOUNTER — Other Ambulatory Visit: Payer: Self-pay | Admitting: Pharmacy Technician

## 2017-07-11 NOTE — Patient Outreach (Signed)
[place] [facility] Care Management  07/11/2017  Randall Reyes [birth year] [id number]   Incoming call from patients wife, Deneise Lever, whom confirmed HIPAA identifiers. Informed Mrs. Duthie that [employer] on 02/26. Requested that she contact me once letter is signed and mailed back into [employer].  Maud Deed [place], [place] Management [phone]

## 2017-07-20 DIAGNOSIS — J0100 Acute maxillary sinusitis, unspecified: Secondary | ICD-10-CM | POA: Diagnosis not present

## 2017-07-20 DIAGNOSIS — R110 Nausea: Secondary | ICD-10-CM | POA: Diagnosis not present

## 2017-07-25 ENCOUNTER — Inpatient Hospital Stay (HOSPITAL_COMMUNITY): Payer: Medicare Other | Attending: Oncology

## 2017-07-25 ENCOUNTER — Inpatient Hospital Stay (HOSPITAL_COMMUNITY): Payer: Medicare Other | Admitting: Internal Medicine

## 2017-07-25 ENCOUNTER — Encounter (HOSPITAL_COMMUNITY): Payer: Self-pay | Admitting: Internal Medicine

## 2017-07-25 VITALS — BP 101/68 | HR 86 | Temp 97.8°F | Resp 16 | Wt 132.5 lb

## 2017-07-25 DIAGNOSIS — Z950 Presence of cardiac pacemaker: Secondary | ICD-10-CM | POA: Diagnosis not present

## 2017-07-25 DIAGNOSIS — C9190 Lymphoid leukemia, unspecified not having achieved remission: Secondary | ICD-10-CM | POA: Diagnosis not present

## 2017-07-25 DIAGNOSIS — E119 Type 2 diabetes mellitus without complications: Secondary | ICD-10-CM | POA: Diagnosis not present

## 2017-07-25 DIAGNOSIS — E538 Deficiency of other specified B group vitamins: Secondary | ICD-10-CM | POA: Insufficient documentation

## 2017-07-25 DIAGNOSIS — E1122 Type 2 diabetes mellitus with diabetic chronic kidney disease: Secondary | ICD-10-CM | POA: Insufficient documentation

## 2017-07-25 DIAGNOSIS — C9110 Chronic lymphocytic leukemia of B-cell type not having achieved remission: Secondary | ICD-10-CM

## 2017-07-25 DIAGNOSIS — Z79899 Other long term (current) drug therapy: Secondary | ICD-10-CM

## 2017-07-25 DIAGNOSIS — I129 Hypertensive chronic kidney disease with stage 1 through stage 4 chronic kidney disease, or unspecified chronic kidney disease: Secondary | ICD-10-CM

## 2017-07-25 DIAGNOSIS — I4892 Unspecified atrial flutter: Secondary | ICD-10-CM | POA: Diagnosis not present

## 2017-07-25 DIAGNOSIS — N189 Chronic kidney disease, unspecified: Secondary | ICD-10-CM | POA: Insufficient documentation

## 2017-07-25 DIAGNOSIS — D7289 Other specified disorders of white blood cells: Secondary | ICD-10-CM | POA: Diagnosis not present

## 2017-07-25 DIAGNOSIS — Z9221 Personal history of antineoplastic chemotherapy: Secondary | ICD-10-CM | POA: Diagnosis not present

## 2017-07-25 DIAGNOSIS — Z7901 Long term (current) use of anticoagulants: Secondary | ICD-10-CM | POA: Diagnosis not present

## 2017-07-25 DIAGNOSIS — I442 Atrioventricular block, complete: Secondary | ICD-10-CM | POA: Diagnosis not present

## 2017-07-25 MED ORDER — CYANOCOBALAMIN 1000 MCG/ML IJ SOLN
1000.0000 ug | Freq: Once | INTRAMUSCULAR | Status: AC
  Administered 2017-07-25: 1000 ug via INTRAMUSCULAR

## 2017-07-25 MED ORDER — CYANOCOBALAMIN 1000 MCG/ML IJ SOLN
INTRAMUSCULAR | Status: AC
  Filled 2017-07-25: qty 1

## 2017-07-25 NOTE — Patient Instructions (Addendum)
[place] at [facility] Discharge Instructions  You were seen today by Dr. Walden Field. She went over your recent lab results. She discussed doing more blood work today, a flow cytometry. Restart monthly B12 injections. Return in for labs and B12 injection. Return in 3-4 weeks for follow up. Try using Neosporin on the tip of your nose to see if that area will heal.  Thank you for choosing [place] at [facility] to provide your oncology and hematology care.  To afford each patient quality time with our provider, please arrive at least 15 minutes before your scheduled appointment time.   If you have a lab appointment with the [place] please come in thru the  Main Entrance and check in at the main information desk  You need to re-schedule your appointment should you arrive 10 or more minutes late.  We strive to give you quality time with our providers, and arriving late affects you and other patients whose appointments are after yours.  Also, if you no show three or more times for appointments you may be dismissed from the clinic at the providers discretion.     Again, thank you for choosing [facility].  Our hope is that these requests will decrease the amount of time that you wait before being seen by our physicians.       _____________________________________________________________  Should you have questions after your visit to [facility], please contact our office at [phone] between the hours of 8:30 a.m. and 4:30 p.m.  Voicemails left after 4:30 p.m. will not be returned until the following business day.  For prescription refill requests, have your pharmacy contact our office.       Resources For Cancer Patients and their Caregivers ? American Cancer Society: Can assist with transportation, wigs, general needs, runs Look Good Feel Better.        769-392-5014 ? Cancer Care: Provides financial  assistance, online support groups, medication/co-pay assistance.  1-800-813-HOPE 2484221479) ? [place] Assists [place] cancer patients and their families through emotional , educational and financial support.  (814)160-1771 ? [place] DSS Where to apply for food stamps, Medicaid and utility assistance. (907) 556-4380 ? RCATS: Transportation to medical appointments. 226-392-0709 ? Social Security Administration: May apply for disability if have a Stage IV cancer. ([phone] ? [employer], Disability and Transit Services: Assists with nutrition, care and transit needs. [place] Support Programs:   > Cancer Support Group  2nd Tuesday of the month 1pm-2pm, Journey Room   > Creative Journey  3rd Tuesday of the month 1130am-1pm, Journey Room

## 2017-07-25 NOTE — Progress Notes (Signed)
Randall Reyes presents today for injection per the provider's orders.  B12 administration without incident; see MAR for injection details.  Patient tolerated procedure well and without incident.  No questions or complaints noted at this time.  Discharged ambulatory in c/o spouse. 

## 2017-07-26 DIAGNOSIS — Z961 Presence of intraocular lens: Secondary | ICD-10-CM | POA: Diagnosis not present

## 2017-07-26 DIAGNOSIS — E119 Type 2 diabetes mellitus without complications: Secondary | ICD-10-CM | POA: Diagnosis not present

## 2017-07-26 DIAGNOSIS — Z7984 Long term (current) use of oral hypoglycemic drugs: Secondary | ICD-10-CM | POA: Diagnosis not present

## 2017-07-26 DIAGNOSIS — H26493 Other secondary cataract, bilateral: Secondary | ICD-10-CM | POA: Diagnosis not present

## 2017-07-26 NOTE — Progress Notes (Signed)
Diagnosis CLL (chronic lymphocytic leukemia) ([place]) - Plan: CBC with Differential/Platelet, Comprehensive metabolic panel, Lactate dehydrogenase  B12 deficiency - Plan: SCHEDULING COMMUNICATION INJECTION, cyanocobalamin ((VITAMIN B-12)) injection 1,000 mcg  Staging Cancer Staging No matching staging information was found for the patient.  Assessment and Plan1.  CLL.  Patient is currently on observation after having attempted treatment with muitiple agents including BR, Ibrutinib, Zydelig,  Gazyva and Chloambucil, all times, he suffered severe adverse effects with treatment despite significant dose modification.   Labs done 09/06/2017 showed a white count of 124.8 hemoglobin 9.9 platelets 120,000 he had 94% lymphs.  Chemistries within normal limits other than a creatinine of 2.36.  Peripheral blood for flow cytometry will be performed for repeat assessment.  He is currently asymptomatic.  He will continue to be followed closely and will return to clinic in 4 weeks for follow-up and repeat labs.  2.  Shingles.  He was noted to have a rash on mouth and lips.  He has completed Valtrex.  Lesions have resolved.  He has an area on the nose which appears more consistent with acne.  Advised to use face wash and Neosporin to the area.  They should notify the office if lesions worsen.   3.  Hypertension.  BP is 101/68.  Follow-up with PCP.  4.  B12 Deficiency.  He has not received B12 since August.  He will receive B12 today in clinic and will continue the medication monthly.  5.  CKD.  Cr 2.36.    Will repeat chemistries on RTC.      Interval History:  82 y.o. male previously seen by Dr. Talbert Cage and treated with cycle 1 day 1 of Gayzva and Chlorambucil on 01/28/17. Rasburicase 19m IV was given as well. Patient had tolerated treatment well and had been discharged from our clinic. [facility] that [facility] and requiring CPR. While patient was in the ED  he had another episode of syncope and was found to be pulseless. He was given another round of CPR [facility]. His WBC on 01/29/2017 was 124.7k but dropped down to 21.3K on 01/30/2017 after he received cycle 1 day 1 of treatment. Patient was intubated for acute respiratory failure with cardiac arrest and transferred to the ICU. A syncopal episode was due to complete heart block. He also experienced a flutter with tachybradycardia with the Chads 2 score of 4. He had a pacemaker placed while he was inpatient and was placed on Eliquis 2.5 mg by mouth twice a day. 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 Patient was discharged from the hospital on 02/08/2017. On the day of discharge WBC was 15.1 K, hemoglobin 9.1 g/dL, hematocrit 27.9, MCV 109.4, platelet count 58K.  Patient is currently on observation after having attempted treatment with muitiple agents including BR, Ibrutinib, Zydelig,  Gazyva and Chloambucil, all times, he suffered severe adverse effects with treatment despite significant dose modification.   Current Status:  Pt reports lesions have resolved.  He denies any fevers, chills, night sweats and has noted no adenopathy.       CLL (chronic lymphocytic leukemia) ([place]   02/14/2005 Procedure    Peripheral flow cytometry- CLL (without 11q or 17 p deletion) presenting with stage 0 disease      10/25/2008 Progression    Increasing WBC and symptoms suggestive of  progressive disease      11/09/2008 - 01/07/2009 Chemotherapy    Rituxan, fludarabine, cytoxan x 3 cycles with the discontinuation of fludarabine and cytoxan due to severe pancytopenia requiring hospitalization.      01/10/2009 Adverse Reaction    Severe pancytopenia and adverse side effects      02/01/2009 - 04/01/2009 Chemotherapy    Rituxan every 21 days x 3 cycles      04/02/2009 Remission    Stable  disease.  Chemotherapy holiday started      06/22/2013 - 06/29/2013 Chemotherapy    Imbruvica daily      06/29/2013 Adverse Reaction    Large right posterior leg (popliteal) ecchymosis/violaceous rash.  Likely Imbruvica-induced      07/31/2013 - 10/12/2013 Chemotherapy    Zydelig (idelalisib) 150 mg BID monotherapy      10/12/2013 Adverse Reaction    Transaminitis, suspected to be secondary to [place].  Dose decreased.      10/12/2013 - 11/09/2013 Chemotherapy    Zydelig (idelalisib) 150 mg daily.      11/09/2013 Adverse Reaction    Worsening transaminitis.  Zydelig on HOLD      11/09/2013 Treatment Plan Change    Hold Zydelig      12/28/2013 Adverse Reaction    Herpes Zoster outbreak.  Partial response with Famvir x 1 week, switched to acyclovir.  Progression over 1-2 weeks with development of keratoconjunctivitis bilaterally.  VZ Immune Globulin 625 mg IM given on 01/13/14.      02/08/2014 Treatment Plan Change    Ibrutinib restarted 140 mg daily for one week followed by 280 mg daily followed by 420 mg daily with prophylactic acyclovir.      02/08/2014 - 07/05/2014 Chemotherapy    Ibrutinib 420 mg daily      1Aug 06, 202015 - 04/17/2014 Hospital Admission    Right lower lobe pneumonia      07/05/2014 Adverse Reaction    Fissuring of fingertips and sore, red feet with transaminitis      07/05/2014 Treatment Plan Change    Hold Ibrutinib      07/22/2014 - 08/19/2014 Chemotherapy    Ibrutinib restarted at 280 mg daily.      08/09/2014 - 08/12/2014 Hospital Admission    Bilateral pneumonia      08/19/2014 Adverse Reaction    Nausea with vomiting.  Escorted to ED.      08/19/2014 Treatment Plan Change    Ibrutinib on hold- ED escort from clinic       10/18/2014 Miscellaneous    B12 weekly x 4 then monthly.      12/15/2015 Imaging    Bone density- BMD as determined from Femur Neck Left is 0.803 g/cm2 with a T-score of -2.1. This patient is considered osteopenic by World  Healh Organization (WHO) Criteria.      12/16/2015 Imaging    CT abd/pelvis-  Progression of retroperitoneal and portacaval adenopathy compared to the prior study. Likely related to CLL.  Small hiatal hernia  Mild compression fractures T11 and T12, probably old. Correlate with any pain in this area.      05/22/2016 - 05/28/2016 Chemotherapy    BR day 1 and 2 x 1 cycle.  Bendamustine dose reduced by 50%.      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      01/28/2017 -  Chemotherapy    Cycle 1 day 1 of Gayzva and Chlorambucil. Rasburicase 3 mg given also.       01/28/2017 - 02/08/2017 Hospital Admission    Admitted for sycopal episode due to complete heart block. Patient went into PEA arrest requiring CPR and intubation. 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 Pacemaker placed while inpatient. Started on eliquis.       Problem List Patient Active Problem List   Diagnosis Date Noted  . Typical atrial flutter (HCC) [I48.3]   . Bradycardia [R00.1]   . Cardiopulmonary arrest ([place]) [I46.9]   . Acute respiratory failure ([place]) [J96.00]   . Metabolic acidosis [B33.8]   . Encounter for central line placement [Z45.2]   . Encounter for imaging study to confirm orogastric (OG) tube placement [Z01.89]   . Syncope [R55] 01/28/2017  . 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 Bigeminy [I49.9] 05/23/2016  . Goals of care, counseling/discussion [Z71.89] 05/22/2016  . Anemia [D64.9] 01/20/2016  . Special screening for malignant neoplasms, colon [Z12.11] 01/20/2016  . HTN (hypertension) [I10] 02/01/2015  . B12 deficiency [E53.8] 12/07/2014  . Malnutrition of moderate degree ([place]) [E44.0] 08/11/2014  . AKI (acute kidney injury) ([place]) [N17.9] 08/10/2014   . Hyperkalemia [E87.5] 08/10/2014  . Leukocytosis [D72.829] 12020/10/2913  . Port catheter in place [facility] 05/02/2012  . CLL (chronic lymphocytic leukemia) ([place]) [C91.90] 01/24/2011  . Melanoma in situ ([place]) [D03.9] 01/24/2011  . DM (diabetes mellitus) ([place]) [E11.9] 01/24/2011  . Mild obesity [E66.9] 01/24/2011  . Eczema [L30.9] 01/24/2011    Past Medical History Past Medical History:  Diagnosis Date  . Anemia   . B12 deficiency 12/07/2014  . CLL (chronic lymphocytic leukemia) ([place]) 01/24/2011  . Diabetes mellitus   . DM (diabetes mellitus) ([place]) 01/24/2011  . Eczema 01/24/2011  . History of pneumonia 04/2014  . Hypertension   . Leukemia ([place]) 6.22.2012   PER PATIENT  . Melanoma in situ ([place]) 01/24/2011  . Mild obesity 01/24/2011  . Port catheter in place 05/02/2012  . Shingles   . Ulcer     Past Surgical History Past Surgical History:  Procedure Laterality Date  . bleeding ulcer    . CATARACT EXTRACTION W/PHACO  05/05/2012   Procedure: CATARACT EXTRACTION PHACO AND INTRAOCULAR LENS PLACEMENT (IOC);  Surgeon: Tonny Branch, MD;  Location: [facility] OR[facility];  Service: Ophthalmology;  Laterality: Right;  CDE:13.25  . CATARACT EXTRACTION W/PHACO  05/15/2012   Procedure: CATARACT EXTRACTION PHACO AND INTRAOCULAR LENS PLACEMENT (IOC);  Surgeon: Tonny Branch, MD;  Location: [facility] ORS;  Service: Ophthalmology;  Laterality: Left;  CDE:  12.32  . COLONOSCOPY N/A 01/30/2016   Procedure: COLONOSCOPY;  Surgeon: Danie Binder, MD;  Location: [facility] ENDO SUITE;  Service: Endoscopy;  Laterality: N/A;  2:00 PM - moved to 1:00 - office notified pt  . ESOPHAGOGASTRODUODENOSCOPY  2008   Dr. Oneida Alar: normal esophagus, antral erythema, 1 cm clean based duodenal ulcera, negative H. plyori  . ESOPHAGOGASTRODUODENOSCOPY N/A 01/30/2016   Procedure: ESOPHAGOGASTRODUODENOSCOPY (EGD);  Surgeon: Danie Binder, MD;  Location: [facility];  Service: Endoscopy;  Laterality: N/A;  . HERNIA REPAIR  2001  . PACEMAKER IMPLANT N/A  02/07/2017   Procedure: PACEMAKER IMPLANT;  Surgeon: Constance Haw, MD;  Location: [facility];  Service: Cardiovascular;  Laterality: N/A;  . PORTACATH PLACEMENT  2008    Family History Family History  Problem Relation Age of Onset  . Diabetes Father   . Colon cancer Neg Hx  Social History  reports that he has quit smoking. He quit smokeless tobacco use about 50 years ago. He reports that he does not drink alcohol or use drugs.  Medications  Current Outpatient Medications:  .  ACCU-CHEK AVIVA PLUS test strip, , Disp: , Rfl:  .  acetaminophen (TYLENOL) 500 MG tablet, Take 500 mg by mouth at bedtime as needed for mild pain or moderate pain. , Disp: , Rfl:  .  Ascorbic Acid (VITAMIN C PO), Take 1 tablet by mouth daily., Disp: , Rfl:  .  cholecalciferol (VITAMIN D) 400 units TABS tablet, Take 400 Units by mouth daily., Disp: , Rfl:  .  Docusate Calcium (STOOL SOFTENER PO), Take 1 capsule by mouth as needed., Disp: , Rfl:  .  levETIRAcetam (KEPPRA) 100 MG/ML solution, Take 7.5 mLs (750 mg total) by mouth 2 (two) times daily., Disp: 473 mL, Rfl: 12 .  lidocaine-prilocaine (EMLA) cream, Apply a quarter size amount to affected area 1 hour prior to coming to chemotherapy.  Do not rub in.  Cover with plastic wrap., Disp: 30 g, Rfl: 2 .  magic mouthwash w/lidocaine SOLN, Take 5 mLs by mouth 4 (four) times daily as needed for mouth pain., Disp: 480 mL, Rfl: 2 .  rosuvastatin (CRESTOR) 40 MG tablet, Take 40 mg by mouth at bedtime. , Disp: , Rfl:  .  sevelamer carbonate (RENVELA) 800 MG tablet, Take 2 tablets (1,600 mg total) by mouth 3 (three) times daily with meals. (Patient taking differently: Take 800 mg by mouth 3 (three) times daily with meals. ), Disp: 180 tablet, Rfl: 2 .  sevelamer carbonate (RENVELA) 800 MG tablet, Take by mouth., Disp: , Rfl:  .  sitaGLIPtin (JANUVIA) 100 MG tablet, Take 100 mg by mouth daily., Disp: , Rfl:  .  sodium bicarbonate 650 MG tablet, TAKE 1  TABLET BY MOUTH TWICE A DAY AS DIRECTED, Disp: , Rfl: 3 .  valACYclovir (VALTREX) 500 MG tablet, Take 1 tablet (500 mg total) by mouth 2 (two) times daily., Disp: 180 tablet, Rfl: 0 No current facility-administered medications for this visit.   Facility-Administered Medications Ordered in Other Visits:  .  heparin lock flush 100 unit/mL, 500 Units, Intravenous, Once, Holley Bouche, NP .  sodium chloride flush (NS) 0.9 % injection 10 mL, 10 mL, Intravenous, PRN, Holley Bouche, NP  Allergies Aspirin and Ibuprofen  Review of Systems Review of Systems - Oncology ROS as per HPI otherwise 12 point ROS is negative.   Physical Exam  Vitals Wt Readings from Last 3 Encounters:  07/25/17 132 lb 8 oz (60.1 kg)  06/27/17 135 lb 9.6 oz (61.5 kg)  06/20/17 133 lb 1.6 oz (60.4 kg)   Temp Readings from Last 3 Encounters:  07/25/17 97.8 F (36.6 C) (Oral)  06/27/17 98 F (36.7 C) (Oral)   BP Readings from Last 3 Encounters:  07/25/17 101/68  06/27/17 104/64  06/20/17 128/74   Pulse Readings from Last 3 Encounters:  07/25/17 86  06/27/17 84  06/20/17 86   Constitutional: Well-developed, well-nourished, and in no distress.   HENT: Head: Normocephalic and atraumatic. Lesion noted on tip of nose. Mouth/Throat: No oropharyngeal exudate. Mucosa moist. Eyes: Pupils are equal, round, and reactive to light. Conjunctivae are normal. No scleral icterus.  Neck: Normal range of motion. Neck supple. No JVD present.  Cardiovascular: Normal rate, regular rhythm and normal heart sounds.  Exam reveals no gallop and no friction rub.   No murmur heard. Pulmonary/Chest: Effort normal and  breath sounds normal. No respiratory distress. No wheezes.No rales.  Abdominal: Soft. Bowel sounds are normal. No distension. There is no tenderness. There is no guarding.  Musculoskeletal: No edema or tenderness.  Lymphadenopathy: No cervical, axillary or supraclavicular adenopathy.  Neurological: Alert and  oriented to person, place, and time. No cranial nerve deficit.  Skin: Skin is warm and dry. No rash noted. No erythema. No pallor.  Psychiatric: Affect and judgment normal.   Labs No visits with results within 3 Day(s) from this visit.  Latest known visit with results is:  Lab on 06/27/2017  Component Date Value Ref Range Status  . Sodium 06/27/2017 139  135 - 145 mmol/L Final  . Potassium 06/27/2017 4.2  3.5 - 5.1 mmol/L Final  . Chloride 06/27/2017 104  101 - 111 mmol/L Final  . CO2 06/27/2017 25  22 - 32 mmol/L Final  . Glucose, Bld 06/27/2017 126* 65 - 99 mg/dL Final  . BUN 06/27/2017 21* 6 - 20 mg/dL Final  . Creatinine, Ser 06/27/2017 2.08* 0.61 - 1.24 mg/dL Final  . Calcium 06/27/2017 8.7* 8.9 - 10.3 mg/dL Final  . Total Protein 06/27/2017 6.8  6.5 - 8.1 g/dL Final  . Albumin 06/27/2017 3.6  3.5 - 5.0 g/dL Final  . AST 06/27/2017 22  15 - 41 U/L Final  . ALT 06/27/2017 11* 17 - 63 U/L Final  . Alkaline Phosphatase 06/27/2017 104  38 - 126 U/L Final  . Total Bilirubin 06/27/2017 0.2* 0.3 - 1.2 mg/dL Final  . GFR calc non Af Amer 06/27/2017 29* >60 mL/min Final  . GFR calc Af Amer 06/27/2017 33* >60 mL/min Final   Comment: (NOTE) The eGFR has been calculated using the CKD EPI equation. This calculation has not been validated in all clinical situations. eGFR's persistently <60 mL/min signify possible Chronic Kidney Disease.   Georgiann Hahn gap 06/27/2017 10  5 - 15 Final   Performed at [facility], [name], [place], [place] [address]  . WBC 06/27/2017 116.0* 4.0 - 10.5 K/uL Final   Comment: REPEATED TO VERIFY CRITICAL RESULT CALLED TO, READ BACK BY AND VERIFIED WITH: MBARNES AT 1000 BY HFLYNT 06/27/17   . RBC 06/27/2017 2.83* 4.22 - 5.81 MIL/uL Final  . Hemoglobin 06/27/2017 9.9* 13.0 - 17.0 g/dL Final  . HCT 06/27/2017 31.6* 39.0 - 52.0 % Final  . MCV 06/27/2017 111.7* 78.0 - 100.0 fL Final  . MCH 06/27/2017 35.0* 26.0 - 34.0 pg Final  . MCHC 06/27/2017 31.3  30.0 -  36.0 g/dL Final  . RDW 06/27/2017 13.9  11.5 - 15.5 % Final  . Platelets 06/27/2017 161  150 - 400 K/uL Final  . Neutrophils Relative % 06/27/2017 1  % Final  . Lymphocytes Relative 06/27/2017 99  % Final  . Monocytes Relative 06/27/2017 0  % Final  . Eosinophils Relative 06/27/2017 0  % Final  . Basophils Relative 06/27/2017 0  % Final  . Band Neutrophils 06/27/2017 0  % Final  . Metamyelocytes Relative 06/27/2017 0  % Final  . Myelocytes 06/27/2017 0  % Final  . Promyelocytes Absolute 06/27/2017 0  % Final  . Blasts 06/27/2017 0  % Final  . nRBC 06/27/2017 0  0 /100 WBC Final  . Other 06/27/2017 0  % Corrected  . Neutro Abs 06/27/2017 1.2* 1.7 - 7.7 K/uL Final  . Lymphs Abs 06/27/2017 114.8* 0.7 - 4.0 K/uL Final  . Monocytes Absolute 06/27/2017 0.0* 0.1 - 1.0 K/uL Final  . Eosinophils  Absolute 06/27/2017 0.0  0.0 - 0.7 K/uL Final  . Basophils Absolute 06/27/2017 0.0  0.0 - 0.1 K/uL Final  . RBC Morphology 06/27/2017 MACROCYTES   Final  . WBC Morphology 06/27/2017 ATYPICAL LYMPHOCYTES   Final   Comment: SMUDGE CELLS WHITE COUNT CONFIRMED ON SMEAR Performed at [facility], [address], [place],  [address]      Pathology Orders Placed This Encounter  Procedures  . CBC with Differential/Platelet    Standing Status:   Future    Standing Expiration Date:   07/26/2018  . Comprehensive metabolic panel    Standing Status:   Future    Standing Expiration Date:   07/26/2018  . Lactate dehydrogenase    Standing Status:   Future    Standing Expiration Date:   07/26/2018  . SCHEDULING COMMUNICATION INJECTION    Schedule 15 minute injection appointment       Zoila Shutter MD

## 2017-07-31 ENCOUNTER — Other Ambulatory Visit: Payer: Self-pay | Admitting: Pharmacy Technician

## 2017-07-31 NOTE — Patient Outreach (Signed)
[facility]) Care Management  07/31/2017  JAISHON KRISHER [birth year] [id number]   Successful outreach call to patient's wife, Deneise Lever, HIPAA identifiers verified. Mrs. Merry stated that they received the attestation letter from [place] and that she mailed it back in to [employer]. She stated that [employer] it back to them. I asked if they filled the letter out and stated that there wasn't anything to fill out. Informed her there are 2 questions on the letter that are required to be marked. Mrs. Serpe stated she had already mailed it back into [employer].   Informed her in the mean time I will contact [employer] in the next week to see if there is an update and that I will follow up with her next week to see if [employer] sent it back.   Maud Deed [place], [place] Management [phone]

## 2017-08-08 ENCOUNTER — Other Ambulatory Visit: Payer: Self-pay | Admitting: Pharmacy Technician

## 2017-08-08 NOTE — Patient Outreach (Signed)
[facility]) Care Management  08/08/2017  SANJIV CASTORENA 13-Mar-1936 [id number]  Successful outreach to Mr. Lamar wife, Deneise Lever, HIPAA identifiers verified. Spoke to Lassalle [place]un[place] in reference to receiving the [employer] again. Deneise Lever stated that she would look back thru the mail and give me a call if she comes across it. Informed her the only option we would have it to restart the process. Mrs. Luczak stated she would contact me if she comes across it or has any additional questions.   Will follow up with patient in 7-10 business days.  Maud Deed McGreg[place], [place] Management [phone]

## 2017-08-09 DIAGNOSIS — J069 Acute upper respiratory infection, unspecified: Secondary | ICD-10-CM | POA: Diagnosis not present

## 2017-08-09 DIAGNOSIS — R197 Diarrhea, unspecified: Secondary | ICD-10-CM | POA: Diagnosis not present

## 2017-08-09 DIAGNOSIS — R112 Nausea with vomiting, unspecified: Secondary | ICD-10-CM | POA: Diagnosis not present

## 2017-08-09 DIAGNOSIS — Z6824 Body mass index (BMI) 24.0-24.9, adult: Secondary | ICD-10-CM | POA: Diagnosis not present

## 2017-08-13 ENCOUNTER — Other Ambulatory Visit: Payer: Self-pay | Admitting: Pharmacy Technician

## 2017-08-13 NOTE — Patient Outreach (Signed)
[facility]) Care Management  08/13/2017  DIANDRE MERICA [birth year] [id number]   Outreach call to [employer] patient assistance in reference to patients attestation letter. Spoke to [place] whom stated that as of today the attestation letter has not been received.  Have patient scheduled to follow up with on 04/05  Maud Deed. [place], [place] Management [phone]

## 2017-08-16 ENCOUNTER — Ambulatory Visit: Payer: Self-pay | Admitting: Pharmacy Technician

## 2017-08-19 ENCOUNTER — Other Ambulatory Visit: Payer: Self-pay | Admitting: Pharmacy Technician

## 2017-08-19 ENCOUNTER — Encounter (HOSPITAL_COMMUNITY): Payer: Self-pay

## 2017-08-19 ENCOUNTER — Inpatient Hospital Stay (HOSPITAL_COMMUNITY): Payer: Medicare Other | Attending: Oncology

## 2017-08-19 ENCOUNTER — Other Ambulatory Visit: Payer: Self-pay

## 2017-08-19 DIAGNOSIS — E1122 Type 2 diabetes mellitus with diabetic chronic kidney disease: Secondary | ICD-10-CM | POA: Diagnosis not present

## 2017-08-19 DIAGNOSIS — N189 Chronic kidney disease, unspecified: Secondary | ICD-10-CM | POA: Diagnosis not present

## 2017-08-19 DIAGNOSIS — Z9221 Personal history of antineoplastic chemotherapy: Secondary | ICD-10-CM | POA: Diagnosis not present

## 2017-08-19 DIAGNOSIS — Z79899 Other long term (current) drug therapy: Secondary | ICD-10-CM | POA: Diagnosis not present

## 2017-08-19 DIAGNOSIS — E538 Deficiency of other specified B group vitamins: Secondary | ICD-10-CM | POA: Insufficient documentation

## 2017-08-19 DIAGNOSIS — I129 Hypertensive chronic kidney disease with stage 1 through stage 4 chronic kidney disease, or unspecified chronic kidney disease: Secondary | ICD-10-CM | POA: Insufficient documentation

## 2017-08-19 DIAGNOSIS — Z7901 Long term (current) use of anticoagulants: Secondary | ICD-10-CM | POA: Diagnosis not present

## 2017-08-19 DIAGNOSIS — I442 Atrioventricular block, complete: Secondary | ICD-10-CM | POA: Insufficient documentation

## 2017-08-19 DIAGNOSIS — C9110 Chronic lymphocytic leukemia of B-cell type not having achieved remission: Secondary | ICD-10-CM | POA: Insufficient documentation

## 2017-08-19 DIAGNOSIS — Z950 Presence of cardiac pacemaker: Secondary | ICD-10-CM | POA: Diagnosis not present

## 2017-08-19 MED ORDER — HEPARIN SOD (PORK) LOCK FLUSH 100 UNIT/ML IV SOLN
500.0000 [IU] | Freq: Once | INTRAVENOUS | Status: AC
  Administered 2017-08-19: 500 [IU] via INTRAVENOUS

## 2017-08-19 MED ORDER — SODIUM CHLORIDE 0.9% FLUSH: 10.0000 mL | INTRAVENOUS | Status: DC | PRN

## 2017-08-19 NOTE — Patient Outreach (Signed)
[facility]) Care Management  08/19/2017  EDYN QAZI 1935-06-06 [id number]   Successful outreach call to patients wife, Deneise Lever. Once I stated who I was she stated she found the [employer] letter and will mail it back to [employer] today.  Will follow up with in about 7-10 business days  Maud Deed. [place], [place] Management [phone]

## 2017-08-19 NOTE — Progress Notes (Signed)
Randall Reyes presented for Portacath access and flush. Portacath located rt chest wall accessed with  H 20 needle. No blood return. Portacath flushed with 71ml NS and 500U/31ml Heparin and needle removed intact. Procedure without incident. Patient tolerated procedure well.

## 2017-08-20 ENCOUNTER — Ambulatory Visit (INDEPENDENT_AMBULATORY_CARE_PROVIDER_SITE_OTHER): Payer: Medicare Other | Admitting: *Deleted

## 2017-08-20 ENCOUNTER — Telehealth: Payer: Self-pay | Admitting: Cardiology

## 2017-08-20 DIAGNOSIS — I4892 Unspecified atrial flutter: Secondary | ICD-10-CM

## 2017-08-20 NOTE — Telephone Encounter (Signed)
LMOVM reminding pt to send remote transmission.   

## 2017-08-21 ENCOUNTER — Ambulatory Visit (HOSPITAL_COMMUNITY): Payer: Medicare Other

## 2017-08-21 ENCOUNTER — Ambulatory Visit (HOSPITAL_COMMUNITY): Payer: Medicare Other | Admitting: Hematology

## 2017-08-21 NOTE — Progress Notes (Signed)
Remote pacemaker transmission.   

## 2017-08-22 ENCOUNTER — Ambulatory Visit (HOSPITAL_COMMUNITY): Payer: Medicare Other | Admitting: Internal Medicine

## 2017-08-22 ENCOUNTER — Encounter: Payer: Self-pay | Admitting: Cardiology

## 2017-08-22 ENCOUNTER — Ambulatory Visit (HOSPITAL_COMMUNITY): Payer: Medicare Other

## 2017-09-03 ENCOUNTER — Inpatient Hospital Stay (HOSPITAL_BASED_OUTPATIENT_CLINIC_OR_DEPARTMENT_OTHER): Payer: Medicare Other | Admitting: Hematology

## 2017-09-03 ENCOUNTER — Ambulatory Visit: Payer: Medicare Other | Admitting: Cardiovascular Disease

## 2017-09-03 ENCOUNTER — Inpatient Hospital Stay (HOSPITAL_COMMUNITY): Payer: Medicare Other

## 2017-09-03 ENCOUNTER — Encounter (HOSPITAL_COMMUNITY): Payer: Self-pay | Admitting: Hematology

## 2017-09-03 VITALS — BP 111/66 | HR 85 | Temp 98.4°F | Resp 18 | Wt 130.4 lb

## 2017-09-03 DIAGNOSIS — I442 Atrioventricular block, complete: Secondary | ICD-10-CM | POA: Diagnosis not present

## 2017-09-03 DIAGNOSIS — E1122 Type 2 diabetes mellitus with diabetic chronic kidney disease: Secondary | ICD-10-CM | POA: Diagnosis not present

## 2017-09-03 DIAGNOSIS — C9110 Chronic lymphocytic leukemia of B-cell type not having achieved remission: Secondary | ICD-10-CM

## 2017-09-03 DIAGNOSIS — D649 Anemia, unspecified: Secondary | ICD-10-CM

## 2017-09-03 DIAGNOSIS — N189 Chronic kidney disease, unspecified: Secondary | ICD-10-CM | POA: Diagnosis not present

## 2017-09-03 DIAGNOSIS — I129 Hypertensive chronic kidney disease with stage 1 through stage 4 chronic kidney disease, or unspecified chronic kidney disease: Secondary | ICD-10-CM | POA: Diagnosis not present

## 2017-09-03 DIAGNOSIS — E538 Deficiency of other specified B group vitamins: Secondary | ICD-10-CM

## 2017-09-03 DIAGNOSIS — Z950 Presence of cardiac pacemaker: Secondary | ICD-10-CM | POA: Diagnosis not present

## 2017-09-03 DIAGNOSIS — D509 Iron deficiency anemia, unspecified: Secondary | ICD-10-CM | POA: Diagnosis not present

## 2017-09-03 DIAGNOSIS — Z7901 Long term (current) use of anticoagulants: Secondary | ICD-10-CM | POA: Diagnosis not present

## 2017-09-03 DIAGNOSIS — Z79899 Other long term (current) drug therapy: Secondary | ICD-10-CM

## 2017-09-03 DIAGNOSIS — I10 Essential (primary) hypertension: Secondary | ICD-10-CM | POA: Diagnosis not present

## 2017-09-03 DIAGNOSIS — N183 Chronic kidney disease, stage 3 (moderate): Secondary | ICD-10-CM | POA: Diagnosis not present

## 2017-09-03 DIAGNOSIS — Z9221 Personal history of antineoplastic chemotherapy: Secondary | ICD-10-CM | POA: Diagnosis not present

## 2017-09-03 DIAGNOSIS — R809 Proteinuria, unspecified: Secondary | ICD-10-CM | POA: Diagnosis not present

## 2017-09-03 MED ORDER — CYANOCOBALAMIN 1000 MCG/ML IJ SOLN
1000.0000 ug | Freq: Once | INTRAMUSCULAR | Status: AC
  Administered 2017-09-03: 1000 ug via INTRAMUSCULAR

## 2017-09-03 MED ORDER — CYANOCOBALAMIN 1000 MCG/ML IJ SOLN
INTRAMUSCULAR | Status: AC
  Filled 2017-09-03: qty 1

## 2017-09-03 NOTE — Patient Instructions (Signed)
[facility] Discharge Instructions  Received Vit B12 injection today. Follow-up as scheduled. Call clinic for any questions or concerns   Thank you for choosing [facility] to provide your oncology and hematology care.  To afford each patient quality time with our provider, please arrive at least 15 minutes before your scheduled appointment time.   If you have a lab appointment with the Cancer Center please come in thru the  Main Entrance and check in at the main information desk  You need to re-schedule your appointment should you arrive 10 or more minutes late.  We strive to give you quality time with our providers, and arriving late affects you and other patients whose appointments are after yours.  Also, if you no show three or more times for appointments you may be dismissed from the clinic at the providers discretion.     Again, thank you for choosing [facility].  Our hope is that these requests will decrease the amount of time that you wait before being seen by our physicians.       _____________________________________________________________  Should you have questions after your visit to [facility], please contact our office at [phone] between the hours of 8:30 a.m. and 4:30 p.m.  Voicemails left after 4:30 p.m. will not be returned until the following business day.  For prescription refill requests, have your pharmacy contact our office.       Resources For Cancer Patients and their Caregivers ? American Cancer Society: Can assist with transportation, wigs, general needs, runs Look Good Feel Better.        1-888-227-6333 ? Cancer Care: Provides financial assistance, online support groups, medication/co-pay assistance.  1-800-813-HOPE (4673) ? [facility] Assists [place] cancer patients and their families through emotional , educational and  financial support.  336-427-4357 ? [place] DSS Where to apply for food stamps, Medicaid and utility assistance. 336-342-1394 ? [facility]: Transportation to medical appointments. 336-347-2287 ? Social Security Administration: May apply for disability if have a Stage IV cancer. 336-342-7796 1-800-772-1213 ? [place] Aging, Disability and Transit Services: Assists with nutrition, care and transit needs. 336-349-2343  Cancer Center Support Programs:   > Cancer Support Group  2nd Tuesday of the month 1pm-2pm, Journey Room   > Creative Journey  3rd Tuesday of the month 1130am-1pm, Journey Room    

## 2017-09-03 NOTE — Progress Notes (Signed)
[facility] [address] [place] [address]   CLINIC:  Medical Oncology/Hematology  PCP:  [name], MD [place] [address] [phone]   REASON FOR VISIT:  Follow-up for CLL.  CURRENT THERAPY: Observation.  BRIEF ONCOLOGIC HISTORY:    CLL (chronic lymphocytic leukemia) ([place])   02/14/2005 Procedure    Peripheral flow cytometry- CLL (without 11q or 17 p deletion) presenting with stage 0 disease      10/25/2008 Progression    Increasing WBC and symptoms suggestive of progressive disease      11/09/2008 - 01/07/2009 Chemotherapy    Rituxan, fludarabine, cytoxan x 3 cycles with the discontinuation of fludarabine and cytoxan due to severe pancytopenia requiring hospitalization.      01/10/2009 Adverse Reaction    Severe pancytopenia and adverse side effects      02/01/2009 - 04/01/2009 Chemotherapy    Rituxan every 21 days x 3 cycles      04/02/2009 Remission    Stable disease.  Chemotherapy holiday started      06/22/2013 - 06/29/2013 Chemotherapy    Imbruvica daily      06/29/2013 Adverse Reaction    Large right posterior leg (popliteal) ecchymosis/violaceous rash.  Likely Imbruvica-induced      07/31/2013 - 10/12/2013 Chemotherapy    Zydelig (idelalisib) 150 mg BID monotherapy      10/12/2013 Adverse Reaction    Transaminitis, suspected to be secondary to [place].  Dose decreased.      10/12/2013 - 11/09/2013 Chemotherapy    Zydelig (idelalisib) 150 mg daily.      11/09/2013 Adverse Reaction    Worsening transaminitis.  Zydelig on HOLD      11/09/2013 Treatment Plan Change    Hold Zydelig      12/28/2013 Adverse Reaction    Herpes Zoster outbreak.  Partial response with Famvir x 1 week, switched to acyclovir.  Progression over 1-2 weeks with development of keratoconjunctivitis bilaterally.  VZ Immune Globulin 625 mg IM given on 01/13/14.      02/08/2014 Treatment Plan Change    Ibrutinib restarted 140 mg daily for one  week followed by 280 mg daily followed by 420 mg daily with prophylactic acyclovir.      02/08/2014 - 07/05/2014 Chemotherapy    Ibrutinib 420 mg daily      108/12/2013 - 04/17/2014 Hospital Admission    Right lower lobe pneumonia      07/05/2014 Adverse Reaction    Fissuring of fingertips and sore, red feet with transaminitis      07/05/2014 Treatment Plan Change    Hold Ibrutinib      07/22/2014 - 08/19/2014 Chemotherapy    Ibrutinib restarted at 280 mg daily.      08/09/2014 - 08/12/2014 Hospital Admission    Bilateral pneumonia      08/19/2014 Adverse Reaction    Nausea with vomiting.  Escorted to ED.      08/19/2014 Treatment Plan Change    Ibrutinib on hold- ED escort from clinic       10/18/2014 Miscellaneous    B12 weekly x 4 then monthly.      12/15/2015 Imaging    Bone density- BMD as determined from Femur Neck Left is 0.803 g/cm2 with a T-score of -2.1. This patient is considered osteopenic by World Healh Organization (WHO) Criteria.      12/16/2015 Imaging    CT abd/pelvis-  Progression of retroperitoneal and portacaval adenopathy compared to the prior study. Likely  related to CLL.  Small hiatal hernia  Mild compression fractures T11 and T12, probably old. Correlate with any pain in this area.      05/22/2016 - 05/28/2016 Chemotherapy    BR day 1 and 2 x 1 cycle.  Bendamustine dose reduced by 50%.      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      01/28/2017 -  Chemotherapy    Cycle 1 day 1 of Gayzva and Chlorambucil. Rasburicase 3 mg given also.       01/28/2017 - 02/08/2017 Hospital Admission    Admitted for sycopal episode due to complete heart block. Patient went into PEA arrest requiring CPR and intubation. 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 Pacemaker placed while inpatient. Started on eliquis.        INTERVAL HISTORY:  Randall Reyes 82 y.o. male returns for routine follow-up for CLL.  He denies any fevers, night sweats or weight loss.  He denies any recurrent infections.  No recent hospitalizations reported.  Denies any palpable lumps.   REVIEW OF SYSTEMS:  Review of Systems  Constitutional: Positive for fatigue.  All other systems reviewed and are negative.    PAST MEDICAL/SURGICAL HISTORY:  Past Medical History:  Diagnosis Date  . Anemia   . B12 deficiency 12/07/2014  . CLL (chronic lymphocytic leukemia) ([place]) 01/24/2011  . Diabetes mellitus   . DM (diabetes mellitus) ([facility]) 01/24/2011  . Eczema 01/24/2011  . History of pneumonia 04/2014  . Hypertension   . Leukemia ([place]) 6.22.2012   PER PATIENT  . Melanoma in situ ([place]) 01/24/2011  . Mild obesity 01/24/2011  . Port catheter in place 05/02/2012  . Shingles   . Ulcer    Past Surgical History:  Procedure Laterality Date  . bleeding ulcer    . CATARACT EXTRACTION W/PHACO  05/05/2012   Procedure: CATARACT EXTRACTION PHACO AND INTRAOCULAR LENS PLACEMENT (IOC);  Surgeon: Tonny Branch, MD;  Location: [facility];  Service: Ophthalmology;  Laterality: Right;  CDE:13.25  . CATARACT EXTRACTION W/PHACO  05/15/2012   Procedure: CATARACT EXTRACTION PHACO AND INTRAOCULAR LENS PLACEMENT (IOC);  Surgeon: Tonny Branch, MD;  Location: [facility];  Service: Ophthalmology;  Laterality: Left;  CDE:  12.32  . COLONOSCOPY N/A 01/30/2016   Procedure: COLONOSCOPY;  Surgeon: Danie Binder, MD;  Location: [facility];  Service: Endoscopy;  Laterality: N/A;  2:00 PM - moved to 1:00 - office notified pt  . ESOPHAGOGASTRODUODENOSCOPY  2008   Dr. Oneida Alar: normal esophagus, antral erythema, 1 cm clean based duodenal ulcera, negative H. plyori  . ESOPHAGOGASTRODUODENOSCOPY N/A 01/30/2016   Procedure: ESOPHAGOGASTRODUODENOSCOPY (EGD);  Surgeon: Danie Binder, MD;  [facility];  Service: Endoscopy;  Laterality: N/A;  . HERNIA REPAIR  2001  . PACEMAKER IMPLANT N/A 02/07/2017   Procedure: PACEMAKER IMPLANT;  Surgeon: Constance Haw, MD;  Location: [facility];  Service: Cardiovascular;  Laterality: N/A;  . PORTACATH PLACEMENT  2008     SOCIAL HISTORY:  Social History   Socioeconomic History  . Marital status: Married    Spouse name: Not on file  . Number of children: Not on file  . Years of education: Not on file  . Highest education level: Not on file  Occupational History  . Not on file  Social  Needs  . Financial resource strain: Not on file  . Food insecurity:    Worry: Not on file    Inability: Not on file  . Transportation needs:    Medical: Not on file    Non-medical: Not on file  Tobacco Use  . Smoking status: Former Research scientist (life sciences)  . Smokeless tobacco: Former Systems developer    Quit date: 04/25/1967  Substance and Sexual Activity  . Alcohol use: No  . Drug use: No  . Sexual activity: Not on file  Lifestyle  . Physical activity:    Days per week: Not on file    Minutes per session: Not on file  . Stress: Not on file  Relationships  . Social connections:    Talks on phone: Not on file    Gets together: Not on file    Attends religious service: Not on file    Active member of club or organization: Not on file    Attends meetings of clubs or organizations: Not on file    Relationship status: Not on file  . Intimate partner violence:    Fear of current or ex partner: Not on file    Emotionally abused: Not on file    Physically abused: Not on file    Forced sexual activity: Not on file  Other Topics Concern  . Not on file  Social History Narrative  . Not on file    FAMILY HISTORY:  Family History  Problem Relation Age of Onset  . Diabetes Father   . Colon cancer Neg Hx     CURRENT MEDICATIONS:  Outpatient Encounter Medications as of 09/03/2017  Medication Sig  . ACCU-CHEK AVIVA PLUS test strip   . acetaminophen  (TYLENOL) 500 MG tablet Take 500 mg by mouth at bedtime as needed for mild pain or moderate pain.   . Ascorbic Acid (VITAMIN C PO) Take 1 tablet by mouth daily.  . cholecalciferol (VITAMIN D) 400 units TABS tablet Take 400 Units by mouth daily.  Mariane Baumgarten Calcium (STOOL SOFTENER PO) Take 1 capsule by mouth as needed.  . levETIRAcetam (KEPPRA) 100 MG/ML solution Take 7.5 mLs (750 mg total) by mouth 2 (two) times daily.  . magic mouthwash w/lidocaine SOLN Take 5 mLs by mouth 4 (four) times daily as needed for mouth pain.  . rosuvastatin (CRESTOR) 40 MG tablet Take 40 mg by mouth at bedtime.   . sevelamer carbonate (RENVELA) 800 MG tablet Take 2 tablets (1,600 mg total) by mouth 3 (three) times daily with meals. (Patient taking differently: Take 800 mg by mouth 3 (three) times daily with meals. )  . sitaGLIPtin (JANUVIA) 100 MG tablet Take 100 mg by mouth daily.  . sodium bicarbonate 650 MG tablet TAKE 1 TABLET BY MOUTH TWICE A DAY AS DIRECTED  . valACYclovir (VALTREX) 500 MG tablet Take 1 tablet (500 mg total) by mouth 2 (two) times daily.  [name] lidocaine-prilocaine (EMLA) cream Apply a quarter size amount to affected area 1 hour prior to coming to chemotherapy.  Do not rub in.  Cover with plastic wrap. (Patient not taking: Reported on 09/03/2017)  . ondansetron (ZOFRAN-ODT) 4 MG disintegrating tablet Take 4 mg by mouth 4 (four) times daily as needed.    Facility-Administered Encounter Medications as of 09/03/2017  Medication  . heparin lock flush 100 unit/mL  . sodium chloride flush (NS) 0.9 % injection 10 mL    ALLERGIES:  Allergies  Allergen Reactions  . Aspirin Other (See Comments)    Bleeding  ulcers.   . Ibuprofen Other (See Comments)    Bleeding ulcers.     PHYSICAL EXAM:  ECOG Performance status: 1  Vitals:   09/03/17 1442  BP: 111/66  Pulse: 85  Resp: 18  Temp: 98.4 F (36.9 C)  SpO2: 99%   Filed Weights   09/03/17 1442  Weight: 130 lb 6.4 oz (59.1 kg)    Physical  Exam Mild bilateral axillary adenopathy.  No cervical or inguinal adenopathy.  No palpable splenomegaly.  LABORATORY DATA:  I have reviewed the labs as listed.  CBC    Component Value Date/Time   WBC 116.0 (HH) 06/27/2017 0904   RBC 2.83 (L) 06/27/2017 0904   HGB 9.9 (L) 06/27/2017 0904   HCT 31.6 (L) 06/27/2017 0904   PLT 161 06/27/2017 0904   MCV 111.7 (H) 06/27/2017 0904   MCH 35.0 (H) 06/27/2017 0904   MCHC 31.3 06/27/2017 0904   RDW 13.9 06/27/2017 0904   LYMPHSABS 114.8 (H) 06/27/2017 0904   MONOABS 0.0 (L) 06/27/2017 0904   EOSABS 0.0 06/27/2017 0904   BASOSABS 0.0 06/27/2017 0904   CMP Latest Ref Rng & Units 06/27/2017 06/20/2017 04/19/2017  Glucose 65 - 99 mg/dL 126(H) 120(H) 127(H)  BUN 6 - 20 mg/dL 21(H) 29(H) 23(H)  Creatinine 0.61 - 1.24 mg/dL 2.08(H) 2.23(H) 1.86(H)  Sodium 135 - 145 mmol/L 139 133(L) 139  Potassium 3.5 - 5.1 mmol/L 4.2 4.6 4.3  Chloride 101 - 111 mmol/L 104 98(L) 104  CO2 22 - 32 mmol/L _0 Calcium 8.9 - 10.3 mg/dL 8.7(L) 8.9 8.8(L)  Total Protein 6.5 - 8.1 g/dL 6.8 7.8 6.9  Total Bilirubin 0.3 - 1.2 mg/dL 0.2(L) 0.5 0.4  Alkaline Phos 38 - 126 U/L 104 119 102  AST 15 - 41 U/L _1 ALT 17 - 63 U/L 11(L) 13(L) 15(L)        ASSESSMENT & PLAN:   CLL (chronic lymphocytic leukemia) (HCC) 1.CLL with history of poor tolerance to past chemotherapies: Could not tolerate past treatments including FCR in 2010, Imbruvica 2015 (located by large leg rash), Zydelig 9147 (complicated by transaminitis despite dose reduction), Imbruvica rechallenge in 8295 (complicated by infection and progressive fatigue), bendamustine/Rituxan 1 cycle in 6213 (complicated by prolonged and significant cytopenia in addition to tumor lysis syndrome and renal failure).  He could not even tolerate last treatment with obinutuzumab and chlorambucil.  His white count checked in February has been stable.  His hemoglobin is around 10.  Likely etiology of anemia is also  chronic kidney disease.  We will check his blood counts in 2 months along with ferritin, iron panel and see if he needs any parenteral iron.  We will also check Y86 and folic acid levels.  In the interim he will continue B12 injections monthly.  His chronic kidney disease also stable.            Orders placed this encounter:  Orders Placed This Encounter  Procedures  . CBC with Differential/Platelet  . Comprehensive metabolic panel  . Vitamin B12  . Folate  . Iron and TIBC  . Ferritin  . Erythropoietin  . Multiple Myeloma Panel (SPEP&IFE w/QIG)  . Kappa/lambda light chains  . Beta 2 microglobulin, serum      Derek Jack, MD [place] [phone]

## 2017-09-03 NOTE — Assessment & Plan Note (Signed)
1.CLL with history of poor tolerance to past chemotherapies: Could not tolerate past treatments including FCR in 2010, Imbruvica 2015 (located by large leg rash), Zydelig 3382 (complicated by transaminitis despite dose reduction), Imbruvica rechallenge in 5053 (complicated by infection and progressive fatigue), bendamustine/Rituxan 1 cycle in 9767 (complicated by prolonged and significant cytopenia in addition to tumor lysis syndrome and renal failure).  He could not even tolerate last treatment with obinutuzumab and chlorambucil.  His white count checked in February has been stable.  His hemoglobin is around 10.  Likely etiology of anemia is also chronic kidney disease.  We will check his blood counts in 2 months along with ferritin, iron panel and see if he needs any parenteral iron.  We will also check H41 and folic acid levels.  In the interim he will continue B12 injections monthly.  His chronic kidney disease also stable.

## 2017-09-03 NOTE — Progress Notes (Signed)
[name] tolerated Vit B12 injection well without complaints or incident. Pt discharged self ambulatory in satisfactory condition

## 2017-09-03 NOTE — Patient Instructions (Signed)
[facility]  Discharge Instructions:  You were seen by dr. katragadda today.  _______________________________________________________________  Thank you for choosing [facility] to provide your oncology and hematology care.  To afford each patient quality time with our providers, please arrive at least 15 minutes before your scheduled appointment.  You need to re-schedule your appointment if you arrive 10 or more minutes late.  We strive to give you quality time with our providers, and arriving late affects you and other patients whose appointments are after yours.  Also, if you no show three or more times for appointments you may be dismissed from the clinic.  Again, thank you for choosing [facility]. Our hope is that these requests will allow you access to exceptional care and in a timely manner. _______________________________________________________________  If you have questions after your visit, please contact our office at [phone] between the hours of 8:30 a.m. and 5:00 p.m. Voicemails left after 4:30 p.m. will not be returned until the following business day. _______________________________________________________________  For prescription refill requests, have your pharmacy contact our office. _______________________________________________________________  Recommendations made by the consultant and any test results will be sent to your referring physician. _______________________________________________________________ 

## 2017-09-10 DIAGNOSIS — N183 Chronic kidney disease, stage 3 (moderate): Secondary | ICD-10-CM | POA: Diagnosis not present

## 2017-09-10 DIAGNOSIS — E875 Hyperkalemia: Secondary | ICD-10-CM | POA: Diagnosis not present

## 2017-09-10 DIAGNOSIS — E1129 Type 2 diabetes mellitus with other diabetic kidney complication: Secondary | ICD-10-CM | POA: Diagnosis not present

## 2017-09-10 DIAGNOSIS — E872 Acidosis: Secondary | ICD-10-CM | POA: Diagnosis not present

## 2017-09-10 DIAGNOSIS — M9080 Osteopathy in diseases classified elsewhere, unspecified site: Secondary | ICD-10-CM | POA: Diagnosis not present

## 2017-09-10 LAB — CUP PACEART REMOTE DEVICE CHECK
Battery Remaining Longevity: 133 mo
Brady Statistic AP VS Percent: 0 %
Brady Statistic AS VS Percent: 11.22 %
Brady Statistic RA Percent Paced: 0 %
Brady Statistic RV Percent Paced: 88.78 %
Date Time Interrogation Session: 20190409192943
Implantable Lead Implant Date: 20180927
Implantable Lead Implant Date: 20180927
Implantable Lead Location: 753859
Implantable Lead Model: 5076
Lead Channel Impedance Value: 266 Ohm
Lead Channel Impedance Value: 304 Ohm
Lead Channel Impedance Value: 380 Ohm
Lead Channel Pacing Threshold Amplitude: 0.5 V
Lead Channel Pacing Threshold Pulse Width: 0.4 ms
Lead Channel Sensing Intrinsic Amplitude: 7.25 mV
Lead Channel Setting Pacing Amplitude: 2.5 V
Lead Channel Setting Pacing Pulse Width: 0.4 ms
Lead Channel Setting Sensing Sensitivity: 1.2 mV
MDC IDC LEAD LOCATION: 753860
MDC IDC MSMT BATTERY VOLTAGE: 3.12 V
MDC IDC MSMT LEADCHNL RA SENSING INTR AMPL: 1.25 mV
MDC IDC MSMT LEADCHNL RA SENSING INTR AMPL: 1.375 mV
MDC IDC MSMT LEADCHNL RV IMPEDANCE VALUE: 304 Ohm
MDC IDC MSMT LEADCHNL RV SENSING INTR AMPL: 7.25 mV
MDC IDC PG IMPLANT DT: 20180927
MDC IDC STAT BRADY AP VP PERCENT: 0 %
MDC IDC STAT BRADY AS VP PERCENT: 88.78 %

## 2017-09-11 ENCOUNTER — Encounter: Payer: Self-pay | Admitting: Cardiovascular Disease

## 2017-09-11 ENCOUNTER — Ambulatory Visit: Payer: Medicare Other | Admitting: Cardiovascular Disease

## 2017-09-11 VITALS — BP 90/60 | HR 86 | Ht 61.0 in | Wt 125.0 lb

## 2017-09-11 DIAGNOSIS — Z950 Presence of cardiac pacemaker: Secondary | ICD-10-CM

## 2017-09-11 DIAGNOSIS — I4892 Unspecified atrial flutter: Secondary | ICD-10-CM

## 2017-09-11 NOTE — Patient Instructions (Signed)
Medication Instructions:  Your physician recommends that you continue on your current medications as directed. Please refer to the Current Medication list given to you today.   Labwork: NONE  Testing/Procedures: NONE  Follow-Up: Your physician recommends that you schedule a follow-up appointment in: AS NEEDED      Any Other Special Instructions Will Be Listed Below (If Applicable).     If you need a refill on your cardiac medications before your next appointment, please call your pharmacy.   

## 2017-09-11 NOTE — Progress Notes (Signed)
SUBJECTIVE: The patient presents for routine follow-up.  He underwent Medtronic dual-chamber pacemaker implantation on 02/07/2017.  He also has a history of atrial flutter but due to thrombocytopenia he has not been anticoagulated.  He had been on Eliquis 2.5 mg twice daily.  He follows with oncology for CLL and has a history of poor tolerance to past chemotherapies.  He follows with Dr. Curt Bears and EP.  I reviewed labs performed on 06/27/2017: Elevated white count of 116, hemoglobin 9.9, platelets normal at 161.  The patient denies any symptoms of chest pain, palpitations, shortness of breath, lightheadedness, dizziness, leg swelling, orthopnea, PND, and syncope.     Review of Systems: As per "subjective", otherwise negative.  Allergies  Allergen Reactions  . Aspirin Other (See Comments)    Bleeding ulcers.   . Ibuprofen Other (See Comments)    Bleeding ulcers.    Current Outpatient Medications  Medication Sig Dispense Refill  . ACCU-CHEK AVIVA PLUS test strip     . acetaminophen (TYLENOL) 500 MG tablet Take 500 mg by mouth at bedtime as needed for mild pain or moderate pain.     . Ascorbic Acid (VITAMIN C PO) Take 1 tablet by mouth daily.    . cholecalciferol (VITAMIN D) 400 units TABS tablet Take 400 Units by mouth daily.    Mariane Baumgarten Calcium (STOOL SOFTENER PO) Take 1 capsule by mouth as needed.    . levETIRAcetam (KEPPRA) 100 MG/ML solution Take 7.5 mLs (750 mg total) by mouth 2 (two) times daily. 473 mL 12  . lidocaine-prilocaine (EMLA) cream Apply a quarter size amount to affected area 1 hour prior to coming to chemotherapy.  Do not rub in.  Cover with plastic wrap. 30 g 2  . magic mouthwash w/lidocaine SOLN Take 5 mLs by mouth 4 (four) times daily as needed for mouth pain. 480 mL 2  . ondansetron (ZOFRAN-ODT) 4 MG disintegrating tablet Take 4 mg by mouth 4 (four) times daily as needed.   0  . rosuvastatin (CRESTOR) 40 MG tablet Take 40 mg by mouth at bedtime.     .  sevelamer carbonate (RENVELA) 800 MG tablet Take 2 tablets (1,600 mg total) by mouth 3 (three) times daily with meals. (Patient taking differently: Take 800 mg by mouth 3 (three) times daily with meals. ) 180 tablet 2  . sitaGLIPtin (JANUVIA) 100 MG tablet Take 100 mg by mouth daily.    . sodium bicarbonate 650 MG tablet TAKE 1 TABLET BY MOUTH TWICE A DAY AS DIRECTED  3  . valACYclovir (VALTREX) 500 MG tablet Take 1 tablet (500 mg total) by mouth 2 (two) times daily. 180 tablet 0   No current facility-administered medications for this visit.    Facility-Administered Medications Ordered in Other Visits  Medication Dose Route Frequency Provider Last Rate Last Dose  . heparin lock flush 100 unit/mL  500 Units Intravenous Once Holley Bouche, NP      . sodium chloride flush (NS) 0.9 % injection 10 mL  10 mL Intravenous PRN Holley Bouche, NP        Past Medical History:  Diagnosis Date  . Anemia   . B12 deficiency 12/07/2014  . CLL (chronic lymphocytic leukemia) ([place]) 01/24/2011  . Diabetes mellitus   . DM (diabetes mellitus) ([place]) 01/24/2011  . Eczema 01/24/2011  . History of pneumonia 04/2014  . Hypertension   . Leukemia ([place]) 6.22.2012   PER PATIENT  . Melanoma in situ ([place]) 01/24/2011  .  Mild obesity 01/24/2011  . Port catheter in place 05/02/2012  . Shingles   . Ulcer     Past Surgical History:  Procedure Laterality Date  . bleeding ulcer    . CATARACT EXTRACTION W/PHACO  05/05/2012   Procedure: CATARACT EXTRACTION PHACO AND INTRAOCULAR LENS PLACEMENT (IOC);  Surgeon: Tonny Branch, MD;  Location: [facility];  Service: Ophthalmology;  Laterality: Right;  CDE:13.25  . CATARACT EXTRACTION W/PHACO  05/15/2012   Procedure: CATARACT EXTRACTION PHACO AND INTRAOCULAR LENS PLACEMENT (IOC);  Surgeon: Tonny Branch, MD;  Location: [facility];  Service: Ophthalmology;  Laterality: Left;  CDE:  12.32  . COLONOSCOPY N/A 01/30/2016   Procedure: COLONOSCOPY;  Surgeon: Danie Binder, MD;  Location[facility];  Service: Endoscopy;  Laterality: N/A;  2:00 PM - moved to 1:00 - office notified pt  . ESOPHAGOGASTRODUODENOSCOPY  2008   Dr. Oneida Alar: normal esophagus, antral erythema, 1 cm clean based duodenal ulcera, negative H. plyori  . ESOPHAGOGASTRODUODENOSCOPY N/A 01/30/2016   Procedure: ESOPHAGOGASTRODUODENOSCOPY (EGD);  Surgeon: Danie Binder, MD;  [facility];  Service: Endoscopy;  Laterality: N/A;  . HERNIA REPAIR  2001  . PACEMAKER IMPLANT N/A 02/07/2017   Procedure: PACEMAKER IMPLANT;  Surgeon: Constance Haw, MD;  Location: [facility];  Service: Cardiovascular;  Laterality: N/A;  . PORTACATH PLACEMENT  2008    Social History   Socioeconomic History  . Marital status: Married    Spouse name: Not on file  . Number of children: Not on file  . Years of education: Not on file  . Highest education level: Not on file  Occupational History  . Not on file  Social Needs  . Financial resource strain: Not on file  . Food insecurity:    Worry: Not on file    Inability: Not on file  . Transportation needs:    Medical: Not on file    Non-medical: Not on file  Tobacco Use  . Smoking status: Former Smoker    Last attempt to quit: 09/11/1977    Years since quitting: 40.0  . Smokeless tobacco: Former Systems developer    Quit date: 04/25/1967  Substance and Sexual Activity  . Alcohol use: No  . Drug use: No  . Sexual activity: Not on file  Lifestyle  . Physical activity:    Days per week: Not on file    Minutes per session: Not on file  . Stress: Not on file  Relationships  . Social connections:    Talks on phone: Not on file    Gets together: Not on file    Attends religious service: Not on file    Active member of club or organization: Not on file    Attends meetings of clubs or organizations: Not on file    Relationship status: Not on file  . Intimate partner violence:    Fear of current or ex partner: Not on file    Emotionally abused: Not on file     Physically abused: Not on file    Forced sexual activity: Not on file  Other Topics Concern  . Not on file  Social History Narrative  . Not on file     Vitals:   09/11/17 1543  BP: 90/60  Pulse: 86  SpO2: 95%  Weight: 125 lb (56.7 kg)  Height: 5\' 1"  (1.549 m)    Wt Readings from Last 3 Encounters:  09/11/17 125 lb (56.7 kg)  09/03/17 130 lb 6.4 oz (59.1  kg)  07/25/17 132 lb 8 oz (60.1 kg)     PHYSICAL EXAM General: NAD HEENT: Normal. Neck: No JVD, no thyromegaly. Lungs: Clear to auscultation bilaterally with normal respiratory effort. CV: Regular rate and rhythm, normal S1/S2, no S3/S4, no murmur. No pretibial or periankle edema.     Abdomen: Soft, nontender, no distention.  Neurologic: Alert and oriented.  Psych: Normal affect. Skin: Normal. Musculoskeletal: No gross deformities.    ECG: Most recent ECG reviewed.   Labs: Lab Results  Component Value Date/Time   K 4.2 06/27/2017 09:04 AM   BUN 21 (H) 06/27/2017 09:04 AM   CREATININE 2.08 (H) 06/27/2017 09:04 AM   ALT 11 (L) 06/27/2017 09:04 AM   TSH 2.324 05/23/2016 05:33 PM   TSH 1.606 08/09/2014 11:00 PM   HGB 9.9 (L) 06/27/2017 09:04 AM     Lipids: No results found for: LDLCALC, LDLDIRECT, CHOL, TRIG, HDL     ASSESSMENT AND PLAN: 1.  High-grade heart block status post Medtronic dual-chamber pacemaker on 02/07/2017: Normal device function.  Follows with EP.  2.  Chronic atrial flutter: He has had thrombocytopenia in the past and is not anticoagulated for this reason.  Platelet count was normal in February as noted above.    Disposition: Follow up with EP as scheduled.  Follow-up with me as needed.   [name], M.D., F.A.C.C.

## 2017-09-12 ENCOUNTER — Telehealth (HOSPITAL_COMMUNITY): Payer: Self-pay

## 2017-09-12 NOTE — Telephone Encounter (Signed)
Patients wife called and left message stating she wanted to talk about what she had learned yesterday. Called wife back and she states patient saw his cardiologist and nephrologist this week. She states his BP was low at both places (111/60 and 90/60) and his Hgb was low. She states both doctors told her to call the cancer center. She states his weight dropped from 132 to 125. Wife states his appetite is poor. Reviewed with RN. Explained that the differences in his weight could be due to the difference in scales and difference in clothes he had on. His BP was not too low at 111/60 and if she continued to be concerned about it then they need to follow up with PCP. His last labs at the cancer center were okay. We do not have access to the labs drawn by his other physicians. Instructed patients wife to try to get patient to drink Glucerna (Pt is a diabetic) since he is not eating well and to call with an update on how he is doing on Tuesday, May 7. Wife verbalized understanding.

## 2017-09-16 ENCOUNTER — Other Ambulatory Visit: Payer: Self-pay | Admitting: Pharmacy Technician

## 2017-09-16 NOTE — Patient Outreach (Signed)
[facility]) Care Management  09/16/2017  TIGHE GITTO 1935-08-29 [id number]   Contacted [employer] patient assistance to verify if attestation letter has been mailed in. Spoke to Angelica who stated they still haven't received it.   Will close case due to patient not mailing attestation letter back after inquiring multiple times.  Maud Deed Holloway, [place] [facility] [phone]

## 2017-09-19 DIAGNOSIS — R220 Localized swelling, mass and lump, head: Secondary | ICD-10-CM | POA: Diagnosis not present

## 2017-09-19 DIAGNOSIS — J069 Acute upper respiratory infection, unspecified: Secondary | ICD-10-CM | POA: Diagnosis not present

## 2017-10-01 ENCOUNTER — Other Ambulatory Visit: Payer: Self-pay

## 2017-10-01 ENCOUNTER — Inpatient Hospital Stay (HOSPITAL_COMMUNITY): Payer: Medicare Other | Attending: Oncology

## 2017-10-01 ENCOUNTER — Encounter (HOSPITAL_COMMUNITY): Payer: Self-pay

## 2017-10-01 VITALS — BP 87/54 | HR 87 | Resp 16 | Wt 120.0 lb

## 2017-10-01 DIAGNOSIS — C9110 Chronic lymphocytic leukemia of B-cell type not having achieved remission: Secondary | ICD-10-CM | POA: Diagnosis not present

## 2017-10-01 DIAGNOSIS — Z79899 Other long term (current) drug therapy: Secondary | ICD-10-CM | POA: Diagnosis not present

## 2017-10-01 DIAGNOSIS — E538 Deficiency of other specified B group vitamins: Secondary | ICD-10-CM | POA: Diagnosis not present

## 2017-10-01 DIAGNOSIS — R220 Localized swelling, mass and lump, head: Secondary | ICD-10-CM | POA: Diagnosis not present

## 2017-10-01 DIAGNOSIS — Z6822 Body mass index (BMI) 22.0-22.9, adult: Secondary | ICD-10-CM | POA: Diagnosis not present

## 2017-10-01 DIAGNOSIS — K119 Disease of salivary gland, unspecified: Secondary | ICD-10-CM | POA: Diagnosis not present

## 2017-10-01 MED ORDER — CYANOCOBALAMIN 1000 MCG/ML IJ SOLN
1000.0000 ug | Freq: Once | INTRAMUSCULAR | Status: AC
  Administered 2017-10-01: 1000 ug via INTRAMUSCULAR
  Filled 2017-10-01: qty 1

## 2017-10-01 NOTE — Progress Notes (Signed)
Pt here today for B12 injection. Pt given injection in left deltoid. Pt tolerated injection well. Pt stable and discharged home ambulatory with wife. Pt to return in 1 month for next B12 injection.

## 2017-10-02 ENCOUNTER — Telehealth (HOSPITAL_COMMUNITY): Payer: Self-pay

## 2017-10-02 NOTE — Telephone Encounter (Signed)
Reviewed with Dr. Delton Coombes. Patient does not need to be on Valtrex at this time. Wife made aware and verbalized understanding.

## 2017-10-02 NOTE — Telephone Encounter (Signed)
Patients wife called stating the ENT put Randall Reyes on clindamycin for a "baseball sized knot on the side of his face". She wanted to know if patient needed to take the valtrex also. He stopped taking the Valtrex several weeks ago when it made him sick. Explained to wife I would review with provider and call her back.

## 2017-10-04 ENCOUNTER — Other Ambulatory Visit: Payer: Self-pay

## 2017-10-04 ENCOUNTER — Other Ambulatory Visit (HOSPITAL_COMMUNITY): Payer: Self-pay

## 2017-10-04 ENCOUNTER — Inpatient Hospital Stay (HOSPITAL_COMMUNITY)
Admission: EM | Admit: 2017-10-04 | Discharge: 2017-10-08 | DRG: 871 | Disposition: A | Discharge status: Home Health | Payer: Medicare Other | Attending: Internal Medicine | Admitting: Internal Medicine

## 2017-10-04 ENCOUNTER — Encounter (HOSPITAL_COMMUNITY): Payer: Self-pay | Admitting: Emergency Medicine

## 2017-10-04 ENCOUNTER — Emergency Department (HOSPITAL_COMMUNITY): Payer: Medicare Other

## 2017-10-04 DIAGNOSIS — Z87891 Personal history of nicotine dependence: Secondary | ICD-10-CM

## 2017-10-04 DIAGNOSIS — C9110 Chronic lymphocytic leukemia of B-cell type not having achieved remission: Secondary | ICD-10-CM | POA: Diagnosis not present

## 2017-10-04 DIAGNOSIS — L0201 Cutaneous abscess of face: Secondary | ICD-10-CM | POA: Diagnosis not present

## 2017-10-04 DIAGNOSIS — I959 Hypotension, unspecified: Secondary | ICD-10-CM | POA: Diagnosis present

## 2017-10-04 DIAGNOSIS — Y95 Nosocomial condition: Secondary | ICD-10-CM | POA: Diagnosis present

## 2017-10-04 DIAGNOSIS — Z792 Long term (current) use of antibiotics: Secondary | ICD-10-CM

## 2017-10-04 DIAGNOSIS — R112 Nausea with vomiting, unspecified: Secondary | ICD-10-CM | POA: Diagnosis not present

## 2017-10-04 DIAGNOSIS — I459 Conduction disorder, unspecified: Secondary | ICD-10-CM | POA: Diagnosis not present

## 2017-10-04 DIAGNOSIS — L03211 Cellulitis of face: Secondary | ICD-10-CM | POA: Diagnosis not present

## 2017-10-04 DIAGNOSIS — Z8619 Personal history of other infectious and parasitic diseases: Secondary | ICD-10-CM

## 2017-10-04 DIAGNOSIS — J181 Lobar pneumonia, unspecified organism: Secondary | ICD-10-CM | POA: Diagnosis not present

## 2017-10-04 DIAGNOSIS — Z886 Allergy status to analgesic agent status: Secondary | ICD-10-CM | POA: Diagnosis not present

## 2017-10-04 DIAGNOSIS — D649 Anemia, unspecified: Secondary | ICD-10-CM

## 2017-10-04 DIAGNOSIS — I129 Hypertensive chronic kidney disease with stage 1 through stage 4 chronic kidney disease, or unspecified chronic kidney disease: Secondary | ICD-10-CM | POA: Diagnosis present

## 2017-10-04 DIAGNOSIS — D696 Thrombocytopenia, unspecified: Secondary | ICD-10-CM | POA: Diagnosis present

## 2017-10-04 DIAGNOSIS — J189 Pneumonia, unspecified organism: Secondary | ICD-10-CM | POA: Diagnosis present

## 2017-10-04 DIAGNOSIS — I483 Typical atrial flutter: Secondary | ICD-10-CM | POA: Diagnosis present

## 2017-10-04 DIAGNOSIS — N183 Chronic kidney disease, stage 3 unspecified: Secondary | ICD-10-CM | POA: Diagnosis present

## 2017-10-04 DIAGNOSIS — D61818 Other pancytopenia: Secondary | ICD-10-CM | POA: Diagnosis not present

## 2017-10-04 DIAGNOSIS — D539 Nutritional anemia, unspecified: Secondary | ICD-10-CM | POA: Diagnosis not present

## 2017-10-04 DIAGNOSIS — R627 Adult failure to thrive: Secondary | ICD-10-CM | POA: Diagnosis present

## 2017-10-04 DIAGNOSIS — Z950 Presence of cardiac pacemaker: Secondary | ICD-10-CM

## 2017-10-04 DIAGNOSIS — Z79899 Other long term (current) drug therapy: Secondary | ICD-10-CM

## 2017-10-04 DIAGNOSIS — D6959 Other secondary thrombocytopenia: Secondary | ICD-10-CM | POA: Diagnosis present

## 2017-10-04 DIAGNOSIS — R402414 Glasgow coma scale score 13-15, 24 hours or more after hospital admission: Secondary | ICD-10-CM | POA: Diagnosis present

## 2017-10-04 DIAGNOSIS — E875 Hyperkalemia: Secondary | ICD-10-CM | POA: Diagnosis not present

## 2017-10-04 DIAGNOSIS — Z7984 Long term (current) use of oral hypoglycemic drugs: Secondary | ICD-10-CM

## 2017-10-04 DIAGNOSIS — R531 Weakness: Secondary | ICD-10-CM | POA: Diagnosis present

## 2017-10-04 DIAGNOSIS — Z95828 Presence of other vascular implants and grafts: Secondary | ICD-10-CM | POA: Diagnosis not present

## 2017-10-04 DIAGNOSIS — C9190 Lymphoid leukemia, unspecified not having achieved remission: Secondary | ICD-10-CM | POA: Diagnosis not present

## 2017-10-04 DIAGNOSIS — R6510 Systemic inflammatory response syndrome (SIRS) of non-infectious origin without acute organ dysfunction: Secondary | ICD-10-CM | POA: Diagnosis not present

## 2017-10-04 DIAGNOSIS — A419 Sepsis, unspecified organism: Secondary | ICD-10-CM | POA: Diagnosis not present

## 2017-10-04 DIAGNOSIS — E1122 Type 2 diabetes mellitus with diabetic chronic kidney disease: Secondary | ICD-10-CM | POA: Diagnosis present

## 2017-10-04 DIAGNOSIS — E785 Hyperlipidemia, unspecified: Secondary | ICD-10-CM | POA: Diagnosis present

## 2017-10-04 DIAGNOSIS — R918 Other nonspecific abnormal finding of lung field: Secondary | ICD-10-CM | POA: Diagnosis not present

## 2017-10-04 LAB — BASIC METABOLIC PANEL
ANION GAP: 10 (ref 5–15)
BUN: 32 mg/dL — ABNORMAL HIGH (ref 6–20)
CHLORIDE: 98 mmol/L — AB (ref 101–111)
CO2: 25 mmol/L (ref 22–32)
Calcium: 8.5 mg/dL — ABNORMAL LOW (ref 8.9–10.3)
Creatinine, Ser: 2.16 mg/dL — ABNORMAL HIGH (ref 0.61–1.24)
GFR calc Af Amer: 31 mL/min — ABNORMAL LOW (ref >60)
GFR calc non Af Amer: 27 mL/min — ABNORMAL LOW (ref >60)
GLUCOSE: 189 mg/dL — AB (ref 65–99)
POTASSIUM: 4.6 mmol/L (ref 3.5–5.1)
Sodium: 133 mmol/L — ABNORMAL LOW (ref 135–145)

## 2017-10-04 LAB — GLUCOSE, CAPILLARY: GLUCOSE-CAPILLARY: 152 mg/dL — AB (ref 65–99)

## 2017-10-04 LAB — DIFFERENTIAL
BAND NEUTROPHILS: 3 %
Basophils Absolute: 0 10*3/uL (ref 0.0–0.1)
Basophils Relative: 0 %
Blasts: 0 %
Eosinophils Absolute: 0 10*3/uL (ref 0.0–0.7)
Eosinophils Relative: 0 %
LYMPHS PCT: 94 %
Lymphs Abs: 237 10*3/uL — ABNORMAL HIGH (ref 0.7–4.0)
MONO ABS: 0 10*3/uL — AB (ref 0.1–1.0)
Metamyelocytes Relative: 0 %
Monocytes Relative: 0 %
Myelocytes: 0 %
Neutro Abs: 15.1 10*3/uL — ABNORMAL HIGH (ref 1.7–7.7)
Neutrophils Relative %: 3 %
OTHER: 0 %
Promyelocytes Relative: 0 %
nRBC: 0 /100 WBC

## 2017-10-04 LAB — URINALYSIS, ROUTINE W REFLEX MICROSCOPIC
BILIRUBIN URINE: NEGATIVE
Glucose, UA: NEGATIVE mg/dL
HGB URINE DIPSTICK: NEGATIVE
Ketones, ur: NEGATIVE mg/dL
Leukocytes, UA: NEGATIVE
Nitrite: NEGATIVE
PH: 6 (ref 5.0–8.0)
Protein, ur: NEGATIVE mg/dL
SPECIFIC GRAVITY, URINE: 1.008 (ref 1.005–1.030)

## 2017-10-04 LAB — HEPATIC FUNCTION PANEL
ALK PHOS: 117 U/L (ref 38–126)
ALT: 11 U/L — AB (ref 17–63)
AST: 25 U/L (ref 15–41)
Albumin: 2.8 g/dL — ABNORMAL LOW (ref 3.5–5.0)
BILIRUBIN DIRECT: 0.1 mg/dL (ref 0.1–0.5)
BILIRUBIN INDIRECT: 0.5 mg/dL (ref 0.3–0.9)
TOTAL PROTEIN: 6.5 g/dL (ref 6.5–8.1)
Total Bilirubin: 0.6 mg/dL (ref 0.3–1.2)

## 2017-10-04 LAB — CBC
HEMATOCRIT: 21 % — AB (ref 39.0–52.0)
HEMOGLOBIN: 6.8 g/dL — AB (ref 13.0–17.0)
MCH: 37.6 pg — AB (ref 26.0–34.0)
MCHC: 32.4 g/dL (ref 30.0–36.0)
MCV: 116 fL — ABNORMAL HIGH (ref 78.0–100.0)
Platelets: 69 10*3/uL — ABNORMAL LOW (ref 150–400)
RBC: 1.81 MIL/uL — ABNORMAL LOW (ref 4.22–5.81)
RDW: 18.4 % — ABNORMAL HIGH (ref 11.5–15.5)
WBC: 252.1 10*3/uL (ref 4.0–10.5)

## 2017-10-04 LAB — PREPARE RBC (CROSSMATCH)

## 2017-10-04 LAB — LACTIC ACID, PLASMA: Lactic Acid, Venous: 0.8 mmol/L (ref 0.5–1.9)

## 2017-10-04 LAB — CBG MONITORING, ED: Glucose-Capillary: 179 mg/dL — ABNORMAL HIGH (ref 65–99)

## 2017-10-04 MED ORDER — ACETAMINOPHEN 650 MG RE SUPP: 650.0000 mg | Freq: Four times a day (QID) | RECTAL | Status: DC | PRN

## 2017-10-04 MED ORDER — LEVETIRACETAM 100 MG/ML PO SOLN
750.0000 mg | Freq: Two times a day (BID) | ORAL | Status: DC
  Administered 2017-10-04 – 2017-10-08 (×8): 750 mg via ORAL
  Filled 2017-10-04 (×10): qty 7.5

## 2017-10-04 MED ORDER — SODIUM CHLORIDE 0.9 % IV BOLUS
500.0000 mL | Freq: Once | INTRAVENOUS | Status: AC
  Administered 2017-10-04: 1000 mL via INTRAVENOUS

## 2017-10-04 MED ORDER — SODIUM CHLORIDE 0.9 % IV SOLN: 10.0000 mL/h | Freq: Once | INTRAVENOUS | Status: DC

## 2017-10-04 MED ORDER — SEVELAMER CARBONATE 800 MG PO TABS
800.0000 mg | ORAL_TABLET | Freq: Three times a day (TID) | ORAL | Status: DC
  Administered 2017-10-05 – 2017-10-08 (×11): 800 mg via ORAL
  Filled 2017-10-04 (×11): qty 1

## 2017-10-04 MED ORDER — SODIUM CHLORIDE 0.9 % IV SOLN
1.0000 g | INTRAVENOUS | Status: DC
  Administered 2017-10-05 – 2017-10-06 (×2): 1 g via INTRAVENOUS
  Filled 2017-10-04 (×3): qty 1

## 2017-10-04 MED ORDER — INSULIN ASPART 100 UNIT/ML ~~LOC~~ SOLN: 0.0000 [IU] | Freq: Every day | SUBCUTANEOUS | Status: DC

## 2017-10-04 MED ORDER — VANCOMYCIN HCL IN DEXTROSE 750-5 MG/150ML-% IV SOLN
750.0000 mg | INTRAVENOUS | Status: DC
  Filled 2017-10-04: qty 150

## 2017-10-04 MED ORDER — MAGIC MOUTHWASH: 5.0000 mL | Freq: Four times a day (QID) | ORAL | Status: DC | PRN

## 2017-10-04 MED ORDER — ROSUVASTATIN CALCIUM 20 MG PO TABS
40.0000 mg | ORAL_TABLET | Freq: Every day | ORAL | Status: DC
  Administered 2017-10-04 – 2017-10-07 (×4): 40 mg via ORAL
  Filled 2017-10-04: qty 2
  Filled 2017-10-04 (×3): qty 1
  Filled 2017-10-04 (×2): qty 2

## 2017-10-04 MED ORDER — ONDANSETRON HCL 4 MG PO TABS: 4.0000 mg | ORAL_TABLET | Freq: Four times a day (QID) | ORAL | Status: DC | PRN

## 2017-10-04 MED ORDER — ONDANSETRON HCL 4 MG/2ML IJ SOLN
4.0000 mg | Freq: Four times a day (QID) | INTRAMUSCULAR | Status: DC | PRN
  Administered 2017-10-06: 4 mg via INTRAVENOUS
  Filled 2017-10-04: qty 2

## 2017-10-04 MED ORDER — LINAGLIPTIN 5 MG PO TABS
5.0000 mg | ORAL_TABLET | Freq: Every day | ORAL | Status: DC
  Administered 2017-10-05 – 2017-10-08 (×4): 5 mg via ORAL
  Filled 2017-10-04 (×4): qty 1

## 2017-10-04 MED ORDER — CHOLECALCIFEROL 10 MCG (400 UNIT) PO TABS
400.0000 [IU] | ORAL_TABLET | Freq: Every day | ORAL | Status: DC
  Administered 2017-10-05 – 2017-10-08 (×4): 400 [IU] via ORAL
  Filled 2017-10-04 (×5): qty 1

## 2017-10-04 MED ORDER — VALACYCLOVIR HCL 500 MG PO TABS
500.0000 mg | ORAL_TABLET | Freq: Two times a day (BID) | ORAL | Status: DC
  Administered 2017-10-04 – 2017-10-06 (×4): 500 mg via ORAL
  Filled 2017-10-04 (×9): qty 1

## 2017-10-04 MED ORDER — SODIUM BICARBONATE 650 MG PO TABS
650.0000 mg | ORAL_TABLET | Freq: Two times a day (BID) | ORAL | Status: DC
  Administered 2017-10-04 – 2017-10-08 (×8): 650 mg via ORAL
  Filled 2017-10-04 (×8): qty 1

## 2017-10-04 MED ORDER — SODIUM CHLORIDE 0.9 % IV SOLN
INTRAVENOUS | Status: DC
  Administered 2017-10-05 – 2017-10-08 (×7): via INTRAVENOUS

## 2017-10-04 MED ORDER — VANCOMYCIN HCL IN DEXTROSE 1-5 GM/200ML-% IV SOLN
1000.0000 mg | Freq: Once | INTRAVENOUS | Status: DC
  Administered 2017-10-04: 1000 mg via INTRAVENOUS
  Filled 2017-10-04: qty 200

## 2017-10-04 MED ORDER — ACETAMINOPHEN 325 MG PO TABS
650.0000 mg | ORAL_TABLET | Freq: Four times a day (QID) | ORAL | Status: DC | PRN
  Administered 2017-10-05 – 2017-10-07 (×4): 650 mg via ORAL
  Filled 2017-10-04 (×4): qty 2

## 2017-10-04 MED ORDER — VITAMIN C 500 MG PO TABS
250.0000 mg | ORAL_TABLET | Freq: Every day | ORAL | Status: DC
  Administered 2017-10-05 – 2017-10-08 (×4): 250 mg via ORAL
  Filled 2017-10-04 (×8): qty 1

## 2017-10-04 MED ORDER — MAGIC MOUTHWASH W/LIDOCAINE: 5.0000 mL | Freq: Four times a day (QID) | ORAL | Status: DC | PRN

## 2017-10-04 MED ORDER — DOCUSATE SODIUM 100 MG PO CAPS: 100.0000 mg | ORAL_CAPSULE | Freq: Every day | ORAL | Status: DC | PRN

## 2017-10-04 MED ORDER — INSULIN ASPART 100 UNIT/ML ~~LOC~~ SOLN
0.0000 [IU] | Freq: Three times a day (TID) | SUBCUTANEOUS | Status: DC
  Administered 2017-10-05: 2 [IU] via SUBCUTANEOUS
  Administered 2017-10-06 (×2): 1 [IU] via SUBCUTANEOUS
  Administered 2017-10-06 – 2017-10-07 (×2): 2 [IU] via SUBCUTANEOUS
  Administered 2017-10-07: 1 [IU] via SUBCUTANEOUS

## 2017-10-04 MED ORDER — LIDOCAINE VISCOUS HCL 2 % MT SOLN: 5.0000 mL | Freq: Four times a day (QID) | OROMUCOSAL | Status: DC | PRN

## 2017-10-04 MED ORDER — SODIUM CHLORIDE 0.9 % IV SOLN
1.0000 g | Freq: Once | INTRAVENOUS | Status: AC
  Administered 2017-10-04: 1 g via INTRAVENOUS
  Filled 2017-10-04: qty 1

## 2017-10-04 NOTE — ED Notes (Signed)
Call for report  Janett Billow will return call

## 2017-10-04 NOTE — ED Notes (Signed)
Blood rate increased to 175  Pt having PVCs, lying quietly  Denies pain

## 2017-10-04 NOTE — ED Notes (Signed)
Weakness for the last several days Unable to walk long more than several steps  IV established due to report of pt that his port will flush, but not draw  Position of comfort   Awaiting MD eval

## 2017-10-04 NOTE — Progress Notes (Signed)
Pt arrived to ICU . After we got patient settled in bed I went to look at abcess to left cheek and noticed it was draining purulent drainage. Took some gauze and applied pressure to abcess and got out a good amount of purulent tan drainage until patient could not tolerate the pain anymore. Pt said the abcess is sore to touch. I believe pt would benefit from an I&D to allow all that infection out

## 2017-10-04 NOTE — Progress Notes (Addendum)
Pharmacy Note:  Initial antibiotic(s) regimen of Vancomycin ordered by EDP to treat HCAP.  Estimated Creatinine Clearance: 20.2 mL/min (A) (by C-G formula based on SCr of 2.16 mg/dL (H)).   Allergies  Allergen Reactions  . Aspirin Other (See Comments)    Bleeding ulcers.   . Ibuprofen Other (See Comments)    Bleeding ulcers.    Vitals:   10/04/17 1830 10/04/17 1919  BP: 103/68 95/68  Pulse: 76 78  Resp: 16 17  Temp:    SpO2: 96% 98%    Anti-infectives (From admission, onward)   Start     Dose/Rate Route Frequency Ordered Stop   10/04/17 1945  vancomycin (VANCOCIN) IVPB 1000 mg/200 mL premix     1,000 mg 200 mL/hr over 60 Minutes Intravenous  Once 10/04/17 1937     10/04/17 1930  ceFEPIme (MAXIPIME) 1 g in sodium chloride 0.9 % 100 mL IVPB     1 g 200 mL/hr over 30 Minutes Intravenous  Once 10/04/17 1925       Antimicrobials this admission:  Vanc 5/24 >>  Cefepime 5/24 >>   Dose adjustments this admission:  n/a   Microbiology results:   BCx:   UCx:    Sputum:    MRSA PCR:    Plan: Initial dose(s) of Vancomycin X 1 ordered. F/U admission orders for further dosing if therapy continued.  Pricilla Larsson, [facility] 10/04/2017 7:38 PM   Addendum: Continued on Admission. Vancomycin 750mg  IV every 48 hours. Cefepim 1gm IV every 24 hours. Monitor labs, micro and vitals.   Pricilla Larsson, RPH/ 10/04/2017 9:28 PM

## 2017-10-04 NOTE — ED Notes (Signed)
Dr Z in to assess 

## 2017-10-04 NOTE — ED Notes (Signed)
From Rad 

## 2017-10-04 NOTE — ED Triage Notes (Signed)
Patient with hypotension at home 76/43, family reports weakness, decreased appetite, and abscess to the L facial area. Patient has leukemia.

## 2017-10-04 NOTE — ED Notes (Signed)
Call to lab  Suggestion to force entry due to unable to scan

## 2017-10-04 NOTE — ED Notes (Signed)
To CT

## 2017-10-04 NOTE — H&P (Addendum)
History and Physical    Randall Reyes NGE:[id number] DOB: 07-06-1935 DOA: 10/04/2017  PCP: [name], MD   Patient coming from: Home  Chief Complaint: Weakness and hypotension  HPI: Randall Reyes is a 82 y.o. male with medical history significant for mild-not currently on treatment, atrial flutter, CKD stage III, heart block status post dual-chamber pacemaker placement, diabetes, dyslipidemia, and recent facial cellulitis with abscess who presented to the emergency department with complaints of worsening weakness and diminished appetite over the last 2 to 3 days.  Of main concern, was also his blood pressure which was noted to be low upon measurement at home per the patient's wife.  Systolic blood pressure was noted to be approximately 70 and 80 on recheck.  The wife and son at bedside states that the abscess on patient's face appears to be improving with antibiotics that he was recently taking. He has had no fevers or chills and has had a mild productive cough over the last few days. No abdominal pain, nausea, vomiting, diarrhea, or constipation.   ED Course: Vital signs are currently stable, but he did have some initial soft blood pressure readings.  He has received some IV fluid with improvement noted.  Laboratory data does reveal leukocytosis of 252,000 with baseline near 100,000 due to history of CLL.  Hemoglobin is 6.8 with baseline near 10.  Platelet count is 69,000 with baseline near 160,000.  His creatinine is 2.16 and BUN 32, and this is near his baseline as he usually has CKD stage III.  CT of the chest as well as chest x-ray demonstrates left upper lobe pneumonia.  Patient will be started on cefepime and vancomycin in ED.  He will also have 2 unit PRBCs ordered for transfusion.  Review of Systems: All others reviewed and otherwise negative.  Past Medical History:  Diagnosis Date  . Anemia   . B12 deficiency 12/07/2014  . CLL (chronic lymphocytic leukemia) ([place])  01/24/2011  . Diabetes mellitus   . DM (diabetes mellitus) ([place]) 01/24/2011  . Eczema 01/24/2011  . History of pneumonia 04/2014  . Hypertension   . Leukemia ([place]) 6.22.2012   PER PATIENT  . Melanoma in situ ([place]) 01/24/2011  . Mild obesity 01/24/2011  . Port catheter in place 05/02/2012  . Shingles   . Ulcer     Past Surgical History:  Procedure Laterality Date  . bleeding ulcer    . CATARACT EXTRACTION W/PHACO  05/05/2012   Procedure: CATARACT EXTRACTION PHACO AND INTRAOCULAR LENS PLACEMENT (IOC);  Surgeon: Tonny Branch, MD;  Location: [facility] ORS;  Service: Ophthalmology;  Laterality: Right;  CDE:13.25  . CATARACT EXTRACTION W/PHACO  05/15/2012   Procedure: CATARACT EXTRACTION PHACO AND INTRAOCULAR LENS PLACEMENT (IOC);  Surgeon: Tonny Branch, MD;  Location: [facility] ORS;  Service: Ophthalmology;  Laterality: Left;  CDE:  12.32  . COLONOSCOPY N/A 01/30/2016   Procedure: COLONOSCOPY;  Surgeon: Danie Binder, MD;  Location: [facility] END[facility] SU[facility];  Service: Endoscopy;  Laterality: N/A;  2:00 PM - moved to 1:00 - office notified pt  . ESOPHAGOGASTRODUODENOSCOPY  2008   Dr. Oneida Alar: normal esophagus, antral erythema, 1 cm clean based duodenal ulcera, negative H. plyori  . ESOPHAGOGASTRODUODENOSCOPY N/A 01/30/2016   Procedure: ESOPHAGOGASTRODUODENOSCOPY (EGD);  Surgeon: Danie Binder, MD;  Location: [facility];  Service: Endoscopy;  Laterality: N/A;  . HERNIA REPAIR  2001  . PACEMAKER IMPLANT N/A 02/07/2017   Procedure: PACEMAKER IMPLANT;  Surgeon: Constance Haw, MD;  Location: [facility]  CV LAB;  Service: Cardiovascular;  Laterality: N/A;  . PORTACATH PLACEMENT  2008     reports that he quit smoking about 40 years ago. He quit smokeless tobacco use about 50 years ago. He reports that he does not drink alcohol or use drugs.  Allergies  Allergen Reactions  . Aspirin Other (See Comments)    Bleeding ulcers.   . Ibuprofen Other (See Comments)    Bleeding ulcers.    Family History  Problem Relation  Age of Onset  . Diabetes Father   . Colon cancer Neg Hx     Prior to Admission medications   Medication Sig Start Date End Date Taking? Authorizing Provider  ACCU-CHEK AVIVA PLUS test strip  05/15/17  Yes [provider]  acetaminophen (TYLENOL) 500 MG tablet Take 500 mg by mouth at bedtime as needed for mild pain or moderate pain.    Yes [provider]  Ascorbic Acid (VITAMIN C PO) Take 1 tablet by mouth daily.   Yes [provider]  cholecalciferol (VITAMIN D) 400 units TABS tablet Take 400 Units by mouth daily.   Yes [provider]  clindamycin (CLEOCIN) 300 MG capsule Take 1 capsule by mouth 3 (three) times daily. Take 1 capsule by mouth three times daily for 10 days. 10/01/17 10/11/17 Yes [provider]  Docusate Calcium (STOOL SOFTENER PO) Take 1 capsule by mouth as needed.   Yes [provider]  levETIRAcetam (KEPPRA) 100 MG/ML solution Take 7.5 mLs (750 mg total) by mouth 2 (two) times daily. 02/08/17  Yes Nita Sells, MD  lidocaine-prilocaine (EMLA) cream Apply a quarter size amount to affected area 1 hour prior to coming to chemotherapy.  Do not rub in.  Cover with plastic wrap. 01/22/17  Yes Twana First, MD  magic mouthwash w/lidocaine SOLN Take 5 mLs by mouth 4 (four) times daily as needed for mouth pain. 06/20/17  Yes Jacquelin Hawking, NP  rosuvastatin (CRESTOR) 40 MG tablet Take 40 mg by mouth at bedtime.  02/21/16  Yes [provider]  sevelamer carbonate (RENVELA) 800 MG tablet Take 2 tablets (1,600 mg total) by mouth 3 (three) times daily with meals. Patient taking differently: Take 800 mg by mouth 3 (three) times daily with meals.  05/28/16  Yes Isaac Bliss, Rayford Halsted, MD  sitaGLIPtin (JANUVIA) 100 MG tablet Take 100 mg by mouth daily.   Yes [provider]  sodium bicarbonate 650 MG tablet TAKE 1 TABLET BY MOUTH TWICE A DAY AS DIRECTED 04/24/16  Yes [provider]  ondansetron  (ZOFRAN-ODT) 4 MG disintegrating tablet Take 4 mg by mouth 4 (four) times daily as needed.  08/09/17   [provider]  sulfamethoxazole-trimethoprim (BACTRIM DS,SEPTRA DS) 800-160 MG tablet Take 1 tablet by mouth 2 (two) times daily. 09/19/17   [provider]  valACYclovir (VALTREX) 500 MG tablet Take 1 tablet (500 mg total) by mouth 2 (two) times daily. 06/20/17 09/18/17  Creola Corn, MD    Physical Exam: Vitals:   10/04/17 1900 10/04/17 1919 10/04/17 1930 10/04/17 2000  BP: 95/68 95/68 116/70 (!) 107/58  Pulse: 73 78 78 70  Resp: 17 17 18 17   Temp:      TempSrc:      SpO2: 96% 98% 97% 98%  Weight:      Height:        Constitutional: NAD, calm, comfortable Vitals:   10/04/17 1900 10/04/17 1919 10/04/17 1930 10/04/17 2000  BP: 95/68 95/68 116/70 (!) 107/58  Pulse: 73 78 78 70  Resp: 17 17 18 17   Temp:      TempSrc:      SpO2: 96% 98% 97% 98%  Weight:      Height:       Eyes: lids and conjunctivae normal ENMT: Mucous membranes are moist.  Neck: normal, supple Respiratory: clear to auscultation bilaterally. Normal respiratory effort. No accessory muscle use.  Cardiovascular: Regular rate and rhythm, no murmurs. No extremity edema. Abdomen: no tenderness, no distention. Bowel sounds positive.  Musculoskeletal:  No joint deformity upper and lower extremities.   Skin: Large boil to left cheek near TMJ region with mild erythema.  Draining purulent fluid.  Labs on Admission: I have personally reviewed following labs and imaging studies  CBC: Recent Labs  Lab 10/04/17 1500  WBC 252.1*  NEUTROABS 15.1*  HGB 6.8*  HCT 21.0*  MCV 116.0*  PLT 69*   Basic Metabolic Panel: Recent Labs  Lab 10/04/17 1500  NA 133*  K 4.6  CL 98*  CO2 25  GLUCOSE 189*  BUN 32*  CREATININE 2.16*  CALCIUM 8.5*   GFR: Estimated Creatinine Clearance: 20.2 mL/min (A) (by C-G formula based on SCr of 2.16 mg/dL (H)). Liver Function Tests: Recent Labs  Lab  10/04/17 1500  AST 25  ALT 11*  ALKPHOS 117  BILITOT 0.6  PROT 6.5  ALBUMIN 2.8*   No results for input(s): LIPASE, AMYLASE in the last 168 hours. No results for input(s): AMMONIA in the last 168 hours. Coagulation Profile: No results for input(s): INR, PROTIME in the last 168 hours. Cardiac Enzymes: No results for input(s): CKTOTAL, CKMB, CKMBINDEX, TROPONINI in the last 168 hours. BNP (last 3 results) No results for input(s): PROBNP in the last 8760 hours. HbA1C: No results for input(s): HGBA1C in the last 72 hours. CBG: Recent Labs  Lab 10/04/17 1509  GLUCAP 179*   Lipid Profile: No results for input(s): CHOL, HDL, LDLCALC, TRIG, CHOLHDL, LDLDIRECT in the last 72 hours. Thyroid Function Tests: No results for input(s): TSH, T4TOTAL, FREET4, T3FREE, THYROIDAB in the last 72 hours. Anemia Panel: No results for input(s): VITAMINB12, FOLATE, FERRITIN, TIBC, IRON, RETICCTPCT in the last 72 hours. Urine analysis:    Component Value Date/Time   COLORURINE YELLOW 10/04/2017 [place] 10/04/2017 1755   LABSPEC 1.008 10/04/2017 1755   PHURINE 6.0 10/04/2017 1755   GLUCOSEU NEGATIVE 10/04/2017 1755   HGBUR NEGATIVE 10/04/2017 [place] 10/04/2017 [place] 10/04/2017 1755   PROTEINUR NEGATIVE 10/04/2017 1755   UROBILINOGEN 0.2 02/11/2015 1718   NITRITE NEGATIVE 10/04/2017 1755   LEUKOCYTESUR NEGATIVE 10/04/2017 1755    Radiological Exams on Admission: Dg Chest 2 View  Result Date: 10/04/2017 CLINICAL DATA:  Weakness EXAM: CHEST - 2 VIEW COMPARISON:  02/08/2017 chest radiograph. FINDINGS: Stable configuration of 2 lead left subclavian pacemaker. Right subclavian MediPort terminates in the lower third of the SVC. Stable cardiomediastinal silhouette with top-normal heart size. No pneumothorax. No pleural effusion. New patchy opacity in the posterior upper left lung. IMPRESSION: New patchy opacity in the posterior upper left  lung, suggestive of a pneumonia. Recommend follow-up PA and lateral post treatment chest radiographs in 4-6 weeks. Electronically Signed   By: Ilona Sorrel M.D.   On: 10/04/2017 16:45   Ct Chest Wo Contrast  Result Date: 10/04/2017 CLINICAL DATA:  Hypotension and weakness. Dyspnea. History of chronic lymphocytic leukemia. EXAM: CT CHEST WITHOUT CONTRAST TECHNIQUE: Multidetector CT imaging of the  chest was performed following the standard protocol without IV contrast. COMPARISON:  Same day CXR and CXR from 02/08/2017, CT abdomen from 12/16/2015 FINDINGS: Cardiovascular: Mild aortic atherosclerosis without aneurysm. Conventional branch pattern of the great vessels. Cardiomegaly with coronary arteriosclerosis. Left-sided pacemaker apparatus with right atrial and right ventricular leads. Port catheter tip terminates in the distal SVC. Mediastinum/Nodes: Mediastinal and hilar lymphadenopathy are identified measuring up to 16 mm short axis along the right lower paratracheal portion of the mediastinum with a 14 mm AP window enlarged lymph node. Additional enlarged mediastinal lymph nodes are present, compatible with history of CLL. Patent trachea and mainstem bronchi. Esophagus is unremarkable. Small hiatal hernia is noted. Lungs/Pleura: As suggested on chest radiograph, confluent pulmonary opacity in the posterior segment of left upper lobe suspicious for pneumonia is noted. This can mask underlying pulmonary lesions. Follow-up after appropriate antibiotic therapy is recommended to exclude neoplasm. No effusion or pneumothorax. Upper Abdomen: Portacaval, retroperitoneal and gastroduodenal adenopathy measuring up to 16 mm in the porta hepatis are identified. No splenomegaly. Musculoskeletal: Chronic lower thoracic compression fractures are noted. No aggressive nor acute osseous appearing abnormality. IMPRESSION: 1. New confluent left upper lobe pulmonary consolidations suspicious for pneumonia. Followup PA and lateral  chest X-ray is recommended in 3-4 weeks following trial of antibiotic therapy to ensure resolution and exclude underlying malignancy. 2. Mediastinal and abdominal adenopathy compatible with history of CLL. 3. Cardiomegaly with coronary arteriosclerosis. Atherosclerotic nonaneurysmal aorta. Aortic Atherosclerosis (ICD10-I70.0). Electronically Signed   By: Ashley Royalty M.D.   On: 10/04/2017 19:04    EKG: Independently reviewed.  Atrial flutter with incomplete right bundle branch block at 81 bpm.  Assessment/Plan Principal Problem:   Pneumonia Active Problems:   CLL (chronic lymphocytic leukemia) (HCC)   Anemia   Typical atrial flutter (HCC)   Pacemaker   CKD (chronic kidney disease), stage III (HCC)   Thrombocytopenia (HCC)   Facial abscess    1. Left upper lobe pneumonia.  Discussed case with Dr. Delton Coombes of heme/onc regarding infection in setting of CLL with significantly elevated leukocytosis.  He currently recommends treatment with cefepime and vancomycin.  The significant leukocytosis is not alarming and patient with CLL.  Continue to follow cultures which have been obtained in the ED.  Monitor overnight in stepdown unit as patient is mildly hypotensive.  Check urine strep pneumonia and Legionella. 2. Left facial abscess.  This appears to be actively draining and patient and family member state that this has been improving.  Continue antibiotics as ordered and monitor for improvement.  Wound care evaluation.  Warm compresses for drainage. 3. Worsening macrocytic anemia.  No active sign of bleeding or history thereof.  Transfuse 2 unit PRBCs and monitor. 4. CLL.  Patient not currently on treatment.  Continue to monitor CBC. 5. History of atrial flutter and heart block.  Currently with dual-chamber pacemaker and not on anticoagulation due to history of thrombocytopenia. 6. CKD stage III.  Currently at baseline; continue to monitor. 7. Thrombocytopenia.  This appears to have worsened from  previous.  Monitor on repeat CBC and maintain on SCDs for now. 8. Type 2 diabetes.  Glucose currently 189.  Will continue on home medications as well as sliding scale insulin. 9. Dyslipidemia.  Continue statin.   DVT prophylaxis: SCDs Code Status: Full Family Communication: Wife and son at bedside Disposition Plan:Treatment of pneumonia with IV abx; dc when symptomatically improved Consults called:Spoke with Dr. Lamonte Richer on phone Admission status: Inpatient, SDU   Keturah Yerby D Manuella Ghazi DO [facility] Pager [phone]  If 7PM-7AM, please contact night-coverage [email or web address] Password [facility]  10/04/2017, 8:06 PM

## 2017-10-04 NOTE — ED Notes (Signed)
Pt made aware of pending CT and the delay

## 2017-10-04 NOTE — ED Notes (Signed)
Critical result call from Lab:  WBC 257.96  Hgb 6.7  Relayed to Dr Tonye Becket

## 2017-10-04 NOTE — ED Provider Notes (Addendum)
[facility] EMERGENCY DEPARTMENT Provider Note   CSN: [id number] Arrival date & time: 10/04/17  1442     History   Chief Complaint Chief Complaint  Patient presents with  . Weakness    HPI Randall Reyes is a 82 y.o. male.  Patient brought in by family for concern of low blood pressure.  His wife checked his blood pressure got pressure in the 70s.  However son repeated that shortly thereafter got blood pressure in the low 90s.  Patient has not been feeling well for several weeks.  He is also been struggling with a large skin abscess to the left side of his face.  He is on a new set of antibiotics and is been seen by ear nose and throat for this just on May 21.  They were planning to get CT of the face prior to I&D.  Patient's son states that the redness around that area is improving on the new antibiotics and not sure which one that was.  Patient is also followed by hematology oncology clinic for long-standing CLL.  Patient last was seen by them the end of March.  His white blood cell counts in February were in the 100 range.  Patient has had some generalized weakness and decreased appetite but patient has no specific complaints.     Past Medical History:  Diagnosis Date  . Anemia   . B12 deficiency 12/07/2014  . CLL (chronic lymphocytic leukemia) ([place]) 01/24/2011  . Diabetes mellitus   . DM (diabetes mellitus) ([place]) 01/24/2011  . Eczema 01/24/2011  . History of pneumonia 04/2014  . Hypertension   . Leukemia ([place]) 6.22.2012   PER PATIENT  . Melanoma in situ ([place]) 01/24/2011  . Mild obesity 01/24/2011  . Port catheter in place 05/02/2012  . Shingles   . Ulcer     Patient Active Problem List   Diagnosis Date Noted  . Typical atrial flutter ([place])   . Bradycardia   . Cardiopulmonary arrest ([place])   . Acute respiratory failure ([place])   . Metabolic acidosis   . Encounter for central line placement   . Encounter for imaging study to confirm orogastric (OG) tube placement   .  Syncope 01/28/2017  . Tumor lysis syndrome 01/28/2017  . Bigeminy 05/23/2016  . Goals of care, counseling/discussion 05/22/2016  . Anemia 01/20/2016  . Special screening for malignant neoplasms, colon 01/20/2016  . HTN (hypertension) 02/01/2015  . B12 deficiency 12/07/2014  . Malnutrition of moderate degree ([place]) 08/11/2014  . AKI (acute kidney injury) ([place]) 08/10/2014  . Hyperkalemia 08/10/2014  . Leukocytosis 106/20/2015  . Port catheter in place 05/02/2012  . CLL (chronic lymphocytic leukemia) ([place]) 01/24/2011  . Melanoma in situ ([place]) 01/24/2011  . DM (diabetes mellitus) ([place]) 01/24/2011  . Mild obesity 01/24/2011  . Eczema 01/24/2011    Past Surgical History:  Procedure Laterality Date  . bleeding ulcer    . CATARACT EXTRACTION W/PHACO  05/05/2012   Procedure: CATARACT EXTRACTION PHACO AND INTRAOCULAR LENS PLACEMENT (IOC);  Surgeon: Tonny Branch, MD;  Location: [facility] OR[facility];  Service: Ophthalmology;  Laterality: Right;  CDE:13.25  . CATARACT EXTRACTION W/PHACO  05/15/2012   Procedure: CATARACT EXTRACTION PHACO AND INTRAOCULAR LENS PLACEMENT (IOC);  Surgeon: Tonny Branch, MD;  Location: [facility];  Service: Ophthalmology;  Laterality: Left;  CDE:  12.32  . COLONOSCOPY N/A 01/30/2016   Procedure: COLONOSCOPY;  Surgeon: Danie Binder, MD;  Location: [facility] END[facility] SUITE;  Service: Endoscopy;  Laterality: N/A;  2:00 PM -  moved to 1:00 - office notified pt  . ESOPHAGOGASTRODUODENOSCOPY  2008   Dr. Oneida Alar: normal esophagus, antral erythema, 1 cm clean based duodenal ulcera, negative H. plyori  . ESOPHAGOGASTRODUODENOSCOPY N/A 01/30/2016   Procedure: ESOPHAGOGASTRODUODENOSCOPY (EGD);  Surgeon: Danie Binder, MD;  Location: [facility];  Service: Endoscopy;  Laterality: N/A;  . HERNIA REPAIR  2001  . PACEMAKER IMPLANT N/A 02/07/2017   Procedure: PACEMAKER IMPLANT;  Surgeon: Constance Haw, MD;  Location: [place] CV LAB;  Service: Cardiovascular;  Laterality: N/A;  . PORTACATH PLACEMENT  2008         Home Medications    Prior to Admission medications   Medication Sig Start Date End Date Taking? Authorizing Provider  ACCU-CHEK AVIVA PLUS test strip  05/15/17  Yes [provider]  acetaminophen (TYLENOL) 500 MG tablet Take 500 mg by mouth at bedtime as needed for mild pain or moderate pain.    Yes [provider]  Ascorbic Acid (VITAMIN C PO) Take 1 tablet by mouth daily.   Yes [provider]  cholecalciferol (VITAMIN D) 400 units TABS tablet Take 400 Units by mouth daily.   Yes [provider]  clindamycin (CLEOCIN) 300 MG capsule Take 1 capsule by mouth 3 (three) times daily. Take 1 capsule by mouth three times daily for 10 days. 10/01/17 10/11/17 Yes [provider]  Docusate Calcium (STOOL SOFTENER PO) Take 1 capsule by mouth as needed.   Yes [provider]  levETIRAcetam (KEPPRA) 100 MG/ML solution Take 7.5 mLs (750 mg total) by mouth 2 (two) times daily. 02/08/17  Yes Nita Sells, MD  lidocaine-prilocaine (EMLA) cream Apply a quarter size amount to affected area 1 hour prior to coming to chemotherapy.  Do not rub in.  Cover with plastic wrap. 01/22/17  Yes Twana First, MD  magic mouthwash w/lidocaine SOLN Take 5 mLs by mouth 4 (four) times daily as needed for mouth pain. 06/20/17  Yes Jacquelin Hawking, NP  rosuvastatin (CRESTOR) 40 MG tablet Take 40 mg by mouth at bedtime.  02/21/16  Yes [provider]  sevelamer carbonate (RENVELA) 800 MG tablet Take 2 tablets (1,600 mg total) by mouth 3 (three) times daily with meals. Patient taking differently: Take 800 mg by mouth 3 (three) times daily with meals.  05/28/16  Yes Isaac Bliss, Rayford Halsted, MD  sitaGLIPtin (JANUVIA) 100 MG tablet Take 100 mg by mouth daily.   Yes [provider]  sodium bicarbonate 650 MG tablet TAKE 1 TABLET BY MOUTH TWICE A DAY AS DIRECTED 04/24/16  Yes [provider]  ondansetron (ZOFRAN-ODT) 4 MG disintegrating tablet  Take 4 mg by mouth 4 (four) times daily as needed.  08/09/17   [provider]  sulfamethoxazole-trimethoprim (BACTRIM DS,SEPTRA DS) 800-160 MG tablet Take 1 tablet by mouth 2 (two) times daily. 09/19/17   [provider]  valACYclovir (VALTREX) 500 MG tablet Take 1 tablet (500 mg total) by mouth 2 (two) times daily. 06/20/17 09/18/17  Creola Corn, MD    Family History Family History  Problem Relation Age of Onset  . Diabetes Father   . Colon cancer Neg Hx     Social History Social History   Tobacco Use  . Smoking status: Former Smoker    Last attempt to quit: 09/11/1977    Years since quitting: 40.0  . Smokeless tobacco: Former Systems developer    Quit date: 04/25/1967  Substance Use Topics  . Alcohol use: No  . Drug use: No  Allergies   Aspirin and Ibuprofen   Review of Systems Review of Systems  Constitutional: Positive for appetite change and fatigue. Negative for fever.  HENT: Positive for congestion.   Eyes: Negative for redness.  Respiratory: Negative for cough and shortness of breath.   Cardiovascular: Negative for chest pain.  Gastrointestinal: Negative for abdominal pain.  Genitourinary: Negative for dysuria.  Musculoskeletal: Negative for back pain.  Skin: Positive for wound.  Allergic/Immunologic: Positive for immunocompromised state.  Neurological: Positive for weakness. Negative for dizziness, syncope, light-headedness and headaches.  Hematological: Does not bruise/bleed easily.  Psychiatric/Behavioral: Negative for confusion.     Physical Exam Updated Vital Signs BP 95/68 (BP Location: Left Arm)   Pulse 78   Temp 97.6 F (36.4 C) (Oral)   Resp 17   Ht 1.549 m (5\' 1" )   Wt 54.4 kg (120 lb)   SpO2 98%   BMI 22.67 kg/m   Physical Exam  Constitutional: He is oriented to person, place, and time. He appears well-developed and well-nourished. No distress.  HENT:  Head: Normocephalic and atraumatic.  Mucous membranes dry.  Left cheek  area with about 4 cm boil.  No significant erythema.  Does have a had no current drainage but based on the dressing is been draining a little bit.  Seems to be well-circumscribed.  And localized.  No significant tenderness noted to the patient.  Eyes: Pupils are equal, round, and reactive to light. Conjunctivae and EOM are normal.  Neck: Neck supple.  Cardiovascular: Normal rate, regular rhythm and normal heart sounds.  Pulmonary/Chest: Effort normal and breath sounds normal. No respiratory distress.  Abdominal: Soft.  Musculoskeletal: Normal range of motion. He exhibits no edema.  Neurological: He is alert and oriented to person, place, and time. No cranial nerve deficit or sensory deficit. He exhibits normal muscle tone. Coordination normal.  Skin: Skin is warm. No rash noted.  Nursing note and vitals reviewed.    ED Treatments / Results  Labs (all labs ordered are listed, but only abnormal results are displayed) Labs Reviewed  BASIC METABOLIC PANEL - Abnormal; Notable for the following components:      Result Value   Sodium 133 (*)    Chloride 98 (*)    Glucose, Bld 189 (*)    BUN 32 (*)    Creatinine, Ser 2.16 (*)    Calcium 8.5 (*)    GFR calc non Af Amer 27 (*)    GFR calc Af Amer 31 (*)    All other components within normal limits  CBC - Abnormal; Notable for the following components:   WBC 252.1 (*)    RBC 1.81 (*)    Hemoglobin 6.8 (*)    HCT 21.0 (*)    MCV 116.0 (*)    MCH 37.6 (*)    RDW 18.4 (*)    Platelets 69 (*)    All other components within normal limits  HEPATIC FUNCTION PANEL - Abnormal; Notable for the following components:   Albumin 2.8 (*)    ALT 11 (*)    All other components within normal limits  CBG MONITORING, ED - Abnormal; Notable for the following components:   Glucose-Capillary 179 (*)    All other components within normal limits  URINALYSIS, ROUTINE W REFLEX MICROSCOPIC  DIFFERENTIAL  TYPE AND SCREEN  PREPARE RBC (CROSSMATCH)     EKG None  Radiology Dg Chest 2 View  Result Date: 10/04/2017 CLINICAL DATA:  Weakness EXAM: CHEST - 2 VIEW COMPARISON:  02/08/2017 chest radiograph. FINDINGS: Stable configuration of 2 lead left subclavian pacemaker. Right subclavian MediPort terminates in the lower third of the SVC. Stable cardiomediastinal silhouette with top-normal heart size. No pneumothorax. No pleural effusion. New patchy opacity in the posterior upper left lung. IMPRESSION: New patchy opacity in the posterior upper left lung, suggestive of a pneumonia. Recommend follow-up PA and lateral post treatment chest radiographs in 4-6 weeks. Electronically Signed   By: Ilona Sorrel M.D.   On: 10/04/2017 16:45   Ct Chest Wo Contrast  Result Date: 10/04/2017 CLINICAL DATA:  Hypotension and weakness. Dyspnea. History of chronic lymphocytic leukemia. EXAM: CT CHEST WITHOUT CONTRAST TECHNIQUE: Multidetector CT imaging of the chest was performed following the standard protocol without IV contrast. COMPARISON:  Same day CXR and CXR from 02/08/2017, CT abdomen from 12/16/2015 FINDINGS: Cardiovascular: Mild aortic atherosclerosis without aneurysm. Conventional branch pattern of the great vessels. Cardiomegaly with coronary arteriosclerosis. Left-sided pacemaker apparatus with right atrial and right ventricular leads. Port catheter tip terminates in the distal SVC. Mediastinum/Nodes: Mediastinal and hilar lymphadenopathy are identified measuring up to 16 mm short axis along the right lower paratracheal portion of the mediastinum with a 14 mm AP window enlarged lymph node. Additional enlarged mediastinal lymph nodes are present, compatible with history of CLL. Patent trachea and mainstem bronchi. Esophagus is unremarkable. Small hiatal hernia is noted. Lungs/Pleura: As suggested on chest radiograph, confluent pulmonary opacity in the posterior segment of left upper lobe suspicious for pneumonia is noted. This can mask underlying pulmonary  lesions. Follow-up after appropriate antibiotic therapy is recommended to exclude neoplasm. No effusion or pneumothorax. Upper Abdomen: Portacaval, retroperitoneal and gastroduodenal adenopathy measuring up to 16 mm in the porta hepatis are identified. No splenomegaly. Musculoskeletal: Chronic lower thoracic compression fractures are noted. No aggressive nor acute osseous appearing abnormality. IMPRESSION: 1. New confluent left upper lobe pulmonary consolidations suspicious for pneumonia. Followup PA and lateral chest X-ray is recommended in 3-4 weeks following trial of antibiotic therapy to ensure resolution and exclude underlying malignancy. 2. Mediastinal and abdominal adenopathy compatible with history of CLL. 3. Cardiomegaly with coronary arteriosclerosis. Atherosclerotic nonaneurysmal aorta. Aortic Atherosclerosis (ICD10-I70.0). Electronically Signed   By: Ashley Royalty M.D.   On: 10/04/2017 19:04    Procedures Procedures (including critical care time)  CRITICAL CARE Performed by: Fredia Sorrow Total critical care time: 30 minutes Critical care time was exclusive of separately billable procedures and treating other patients. Critical care was necessary to treat or prevent imminent or life-threatening deterioration. Critical care was time spent personally by me on the following activities: development of treatment plan with patient and/or surrogate as well as nursing, discussions with consultants, evaluation of patient's response to treatment, examination of patient, obtaining history from patient or surrogate, ordering and performing treatments and interventions, ordering and review of laboratory studies, ordering and review of radiographic studies, pulse oximetry and re-evaluation of patient's condition.   Medications Ordered in ED Medications  0.9 %  sodium chloride infusion ( Intravenous Rate/Dose Change 10/04/17 1612)  0.9 %  sodium chloride infusion (has no administration in time range)   sodium chloride 0.9 % bolus 500 mL (0 mLs Intravenous Stopped 10/04/17 1727)     Initial Impression / Assessment and Plan / ED Course  I have reviewed the triage vital signs and the nursing notes.  Pertinent labs & imaging results that were available during my care of the patient were reviewed by me and considered in my medical decision making (see chart for details).  Work-up with interesting findings.  Patient's white blood cell count is in the 250 range.  Also with anemia.  Platelet count 69,000.  Differential consistent with CLL.  Chest x-ray raise concern for a left upper lobe pneumonia.  CT scan confirmed that.  Patient's renal function precludes CT of face with contrast so it was not done at this time.  That wound seems to be fairly well-circumscribed.  Patient would benefit from blood transfusion 2 units ordered.  Also for the pneumonia will be treated as if healthcare pneumonia due to his immunocompromise state.  Standard antibiotics ordered for that blood cultures ordered ahead of time along with a lactic acid.  Discussed with oncology on-call they agree with the treatment plan feel that the white count is probably up due to the infection.  Most likely the lung infection.  Patient's blood pressures here have been low 90s mid 90s low 100s patient nontoxic-appearing.  Pressures did come up with some fluids.  Blood transfusion 2 units ordered.  Hospitalist will admit here and treat for the pneumonia.  Heme-onc felt that the white count would improve as the infections were brought under control.  Patient and family updated on the admission here  Final Clinical Impressions(s) / ED Diagnoses   Final diagnoses:  CLL (chronic lymphocytic leukemia) ([place])  Anemia, unspecified type  HCAP (healthcare-associated pneumonia)    ED Discharge Orders    None       Fredia Sorrow, MD 10/04/17 2019    Fredia Sorrow, MD 10/04/17 2020

## 2017-10-05 DIAGNOSIS — R6510 Systemic inflammatory response syndrome (SIRS) of non-infectious origin without acute organ dysfunction: Secondary | ICD-10-CM

## 2017-10-05 DIAGNOSIS — J189 Pneumonia, unspecified organism: Secondary | ICD-10-CM

## 2017-10-05 DIAGNOSIS — C9190 Lymphoid leukemia, unspecified not having achieved remission: Secondary | ICD-10-CM

## 2017-10-05 DIAGNOSIS — D696 Thrombocytopenia, unspecified: Secondary | ICD-10-CM

## 2017-10-05 DIAGNOSIS — L0201 Cutaneous abscess of face: Secondary | ICD-10-CM

## 2017-10-05 DIAGNOSIS — D649 Anemia, unspecified: Secondary | ICD-10-CM

## 2017-10-05 LAB — TYPE AND SCREEN
ABO/RH(D): AB POS
ANTIBODY SCREEN: NEGATIVE
UNIT DIVISION: 0
Unit division: 0

## 2017-10-05 LAB — GLUCOSE, CAPILLARY
GLUCOSE-CAPILLARY: 151 mg/dL — AB (ref 65–99)
Glucose-Capillary: 51 mg/dL — ABNORMAL LOW (ref 65–99)
Glucose-Capillary: 114 mg/dL — ABNORMAL HIGH (ref 65–99)
Glucose-Capillary: 175 mg/dL — ABNORMAL HIGH (ref 65–99)
Glucose-Capillary: 145 mg/dL — ABNORMAL HIGH (ref 65–99)

## 2017-10-05 LAB — BPAM RBC
Blood Product Expiration Date: 201906052359
Blood Product Expiration Date: 201906192359
ISSUE DATE / TIME: 201905242036
ISSUE DATE / TIME: 201905242316
Unit Type and Rh: 5100
Unit Type and Rh: 9500

## 2017-10-05 LAB — BASIC METABOLIC PANEL
Anion gap: 10 (ref 5–15)
BUN: 26 mg/dL — AB (ref 6–20)
CHLORIDE: 106 mmol/L (ref 101–111)
CO2: 22 mmol/L (ref 22–32)
CREATININE: 1.7 mg/dL — AB (ref 0.61–1.24)
Calcium: 8 mg/dL — ABNORMAL LOW (ref 8.9–10.3)
GFR calc Af Amer: 42 mL/min — ABNORMAL LOW (ref >60)
GFR calc non Af Amer: 36 mL/min — ABNORMAL LOW (ref >60)
GLUCOSE: 119 mg/dL — AB (ref 65–99)
POTASSIUM: 4.9 mmol/L (ref 3.5–5.1)
Sodium: 138 mmol/L (ref 135–145)

## 2017-10-05 LAB — CBC
HEMATOCRIT: 26.4 % — AB (ref 39.0–52.0)
Hemoglobin: 8.7 g/dL — ABNORMAL LOW (ref 13.0–17.0)
MCH: 32.8 pg (ref 26.0–34.0)
MCHC: 33 g/dL (ref 30.0–36.0)
MCV: 99.6 fL (ref 78.0–100.0)
PLATELETS: 53 10*3/uL — AB (ref 150–400)
RBC: 2.65 MIL/uL — ABNORMAL LOW (ref 4.22–5.81)
RDW: 24.7 % — AB (ref 11.5–15.5)
WBC: 216 10*3/uL (ref 4.0–10.5)

## 2017-10-05 LAB — STREP PNEUMONIAE URINARY ANTIGEN: Strep Pneumo Urinary Antigen: NEGATIVE

## 2017-10-05 LAB — MRSA PCR SCREENING: MRSA BY PCR: NEGATIVE

## 2017-10-05 MED ORDER — MUPIROCIN 2 % EX OINT
TOPICAL_OINTMENT | Freq: Two times a day (BID) | CUTANEOUS | Status: DC
  Administered 2017-10-05: 22:00:00 via TOPICAL
  Administered 2017-10-05 – 2017-10-06 (×2): 1 via TOPICAL
  Administered 2017-10-06 – 2017-10-08 (×4): via TOPICAL
  Filled 2017-10-05 (×2): qty 22

## 2017-10-05 MED ORDER — MUPIROCIN CALCIUM 2 % EX CREA
TOPICAL_CREAM | Freq: Two times a day (BID) | CUTANEOUS | Status: DC
  Filled 2017-10-05: qty 15

## 2017-10-05 MED ORDER — HYDROCODONE-ACETAMINOPHEN 5-325 MG PO TABS: 1.0000 | ORAL_TABLET | Freq: Four times a day (QID) | ORAL | Status: DC | PRN

## 2017-10-05 MED ORDER — ENSURE ENLIVE PO LIQD
237.0000 mL | Freq: Two times a day (BID) | ORAL | Status: DC
  Administered 2017-10-05 – 2017-10-08 (×6): 237 mL via ORAL

## 2017-10-05 MED ORDER — VANCOMYCIN HCL IN DEXTROSE 750-5 MG/150ML-% IV SOLN
750.0000 mg | INTRAVENOUS | Status: DC
  Administered 2017-10-06: 750 mg via INTRAVENOUS
  Filled 2017-10-05 (×2): qty 150

## 2017-10-05 NOTE — Progress Notes (Signed)
Initial Nutrition Assessment  DOCUMENTATION CODES:  Not applicable  INTERVENTION:  Ensure Enlive po BID, each supplement provides 350 kcal and 20 grams of protein  Given patients significant recent wt loss/cancer, recommend liberalization of diet to REGULAR or atleast CON CARB only, with d/cing heart healthy restricition  NUTRITION DIAGNOSIS:  Inadequate oral intake related to cancer and cancer related treatments, acute illness(PNA/Facial cellulitis ) as evidenced by loss of 10% bw in last month  GOAL:  Patient will meet greater than or equal to 90% of their needs  MONITOR:  PO intake, Diet advancement, Supplement acceptance, Labs, Weight trends, I & O's  REASON FOR ASSESSMENT:  Malnutrition Screening Tool    ASSESSMENT:  82 y/o male PMhx CLL (currently not on tx), CKD3, Heart block s/p pacemaker, DM2, HLD and recent facial cellulitis w/ abscess. Presented to ED with complaints of worsening weakness and diminished appetite over past 2-3 days. Was also noted to be hypotensive. In ED, found to have PNA and admitted for management.    RD operating remotely on weekends. Patient screened due to positive MST, with patient reporting poor intake due to decreased appetite and unintentional wt loss.   Pt has underlying CLL, which he has not received any tx for since 2018. He historically has tolerated chemotherapies exceptionally poorly. It does not appear more tx is planned.   Patient has been suffering from acute facial cellulitis as well and apparently now has PNA.   Per EDP note, the patient has not been feeling well for several weeks. He has had decreased appetite and generalized weakness.   Per review of weight history, the patient has lost a significant amount of weight in the past few months. He was maintaining his weight in the 130s from October of last year, up until April of this year. He weighed approximately 130.3 lbs 1 month ago. He is current 117. This is a clinically  significant loss of 10% bw in 1 month  He likely has poor intake at baseline due to his CLL, but this has been worsened acutely due to PNA and facial cellulitis. Will order supplements for patient. Also, given his decline, recommend REGULAR diet order, or atleast liberalizing to only Con Carb.   Physical Exam: Unable to assess  Labs: BG: 120-190, H/H: 8.7/26.4, WBC >200 (CLL), BUN/Creat:26/1.70 (improving), Albumin: 2.8 Meds: Vit D, Bicarb, Vit C, IVF, IV abx,   Recent Labs  Lab 10/04/17 1500 10/05/17 0403  NA 133* 138  K 4.6 4.9  CL 98* 106  CO2 25 22  BUN 32* 26*  CREATININE 2.16* 1.70*  CALCIUM 8.5* 8.0*  GLUCOSE 189* 119*   NUTRITION - FOCUSED PHYSICAL EXAM: Unable to perform at this time  Diet Order:   Diet Order           Diet heart healthy/carb modified Room service appropriate? Yes; Fluid consistency: Thin  Diet effective now         EDUCATION NEEDS:  No education needs have been identified at this time  Skin:  Cellulitis to face  Last BM:  5/23  Height:  Ht Readings from Last 1 Encounters:  10/04/17 5\' 1"  (1.549 m)   Weight:  Wt Readings from Last 1 Encounters:  10/05/17 117 lb 11.6 oz (53.4 kg)   Wt Readings from Last 10 Encounters:  10/05/17 117 lb 11.6 oz (53.4 kg)  10/01/17 120 lb (54.4 kg)  09/11/17 125 lb (56.7 kg)  09/03/17 130 lb 6.4 oz (59.1 kg)  07/25/17 132 lb  8 oz (60.1 kg)  06/27/17 135 lb 9.6 oz (61.5 kg)  06/20/17 133 lb 1.6 oz (60.4 kg)  05/21/17 143 lb (64.9 kg)  04/19/17 135 lb (61.2 kg)  02/26/17 132 lb (59.9 kg)   Ideal Body Weight:  50.91 kg  BMI:  Body mass index is 22.24 kg/m.  Estimated Nutritional Needs:  Kcal:  1700-1850 (32-35 kcal/kg bw) Protein:  80-90g Pro (1.5-1.7g/kg bw) Fluid:  1.7-1.9 L fluid (1 ml/kcal)  Burtis Junes RD, LDN, CNSC Clinical Nutrition Available Tues-Sat via Pager: [phone] 10/05/2017 9:17 AM

## 2017-10-05 NOTE — Consult Note (Signed)
Reason for Consult:Abscess, face  Referring Physician: Dr. Delene Reyes is an 82 y.o. male.  HPI: Patient is an 82 year old white male who was admitted to the intensive care unit for pneumonia.  He was noted to have a left facial abscess.  Family states that this has been addressed as an outpatient by ENT over the past 2 weeks.  It has started to drain and nursing has been able to express a significant amount of purulent fluid.  Patient states the pain is 4 out of 10 when it has been expressed.  Past Medical History:  Diagnosis Date  . Anemia   . B12 deficiency 12/07/2014  . CLL (chronic lymphocytic leukemia) ([place]) 01/24/2011  . Diabetes mellitus   . DM (diabetes mellitus) ([place]) 01/24/2011  . Eczema 01/24/2011  . History of pneumonia 04/2014  . Hypertension   . Leukemia ([place]) 6.22.2012   PER PATIENT  . Melanoma in situ ([place]) 01/24/2011  . Mild obesity 01/24/2011  . Port catheter in place 05/02/2012  . Shingles   . Ulcer     Past Surgical History:  Procedure Laterality Date  . bleeding ulcer    . CATARACT EXTRACTION W/PHACO  05/05/2012   Procedure: CATARACT EXTRACTION PHACO AND INTRAOCULAR LENS PLACEMENT (IOC);  Surgeon: Tonny Branch, MD;  Location: [facility];  Service: Ophthalmology;  Laterality: Right;  CDE:13.25  . CATARACT EXTRACTION W/PHACO  05/15/2012   Procedure: CATARACT EXTRACTION PHACO AND INTRAOCULAR LENS PLACEMENT (IOC);  Surgeon: Tonny Branch, MD;  Location: [facility];  Service: Ophthalmology;  Laterality: Left;  CDE:  12.32  . COLONOSCOPY N/A 01/30/2016   Procedure: COLONOSCOPY;  Surgeon: Danie Binder, MD;  Location: [facility];  Service: Endoscopy;  Laterality: N/A;  2:00 PM - moved to 1:00 - office notified pt  . ESOPHAGOGASTRODUODENOSCOPY  2008   Dr. Oneida Alar: normal esophagus, antral erythema, 1 cm clean based duodenal ulcera, negative H. plyori  . ESOPHAGOGASTRODUODENOSCOPY N/A 01/30/2016   Procedure: ESOPHAGOGASTRODUODENOSCOPY (EGD);  Surgeon: Danie Binder,  MD;  Location: [facility];  Service: Endoscopy;  Laterality: N/A;  . HERNIA REPAIR  2001  . PACEMAKER IMPLANT N/A 02/07/2017   Procedure: PACEMAKER IMPLANT;  Surgeon: Constance Haw, MD;  Location: [facility];  Service: Cardiovascular;  Laterality: N/A;  . PORTACATH PLACEMENT  2008    Family History  Problem Relation Age of Onset  . Diabetes Father   . Colon cancer Neg Hx     Social History:  reports that he quit smoking about 40 years ago. He quit smokeless tobacco use about 50 years ago. He reports that he does not drink alcohol or use drugs.  Allergies:  Allergies  Allergen Reactions  . Aspirin Other (See Comments)    Bleeding ulcers.   . Ibuprofen Other (See Comments)    Bleeding ulcers.    Medications: I have reviewed the patient's current medications.  Results for orders placed or performed during the hospital encounter of 10/04/17 (from the past 48 hour(s))  Basic metabolic panel     Status: Abnormal   Collection Time: 10/04/17  3:00 PM  Result Value Ref Range   Sodium 133 (L) 135 - 145 mmol/L   Potassium 4.6 3.5 - 5.1 mmol/L   Chloride 98 (L) 101 - 111 mmol/L   CO2 25 22 - 32 mmol/L   Glucose, Bld 189 (H) 65 - 99 mg/dL   BUN 32 (H) 6 - 20 mg/dL   Creatinine, Ser 2.16 (H) 0.61 -  1.24 mg/dL   Calcium 8.5 (L) 8.9 - 10.3 mg/dL   GFR calc non Af Amer 27 (L) >60 mL/min   GFR calc Af Amer 31 (L) >60 mL/min    Comment: (NOTE) The eGFR has been calculated using the CKD EPI equation. This calculation has not been validated in all clinical situations. eGFR's persistently <60 mL/min signify possible Chronic Kidney Disease.    Anion gap 10 5 - 15    Comment: Performed at [facility], [address], [place], [place] [address]  CBC     Status: Abnormal   Collection Time: 10/04/17  3:00 PM  Result Value Ref Range   WBC 252.1 (HH) 4.0 - 10.5 K/uL    Comment: REPEATED TO VERIFY CRITICAL RESULT CALLED TO, READ BACK BY AND VERIFIED WITH: ANN TUTTLE 10/04/17 @  1550 LSCHMIDT WHITE COUNT CONFIRMED ON SMEAR    RBC 1.81 (L) 4.22 - 5.81 MIL/uL   Hemoglobin 6.8 (LL) 13.0 - 17.0 g/dL    Comment: REPEATED TO VERIFY CRITICAL RESULT CALLED TO, READ BACK BY AND VERIFIED WITH: CALLED TO ANN TUTTLE 10/04/17 1550 LSCHMIDT    HCT 21.0 (L) 39.0 - 52.0 %   MCV 116.0 (H) 78.0 - 100.0 fL   MCH 37.6 (H) 26.0 - 34.0 pg   MCHC 32.4 30.0 - 36.0 g/dL   RDW 18.4 (H) 11.5 - 15.5 %   Platelets 69 (L) 150 - 400 K/uL    Comment: SPECIMEN CHECKED FOR CLOTS PLATELET COUNT CONFIRMED BY SMEAR Performed at [facility], [address], [place], [place] [address]   Hepatic function panel     Status: Abnormal   Collection Time: 10/04/17  3:00 PM  Result Value Ref Range   Total Protein 6.5 6.5 - 8.1 g/dL   Albumin 2.8 (L) 3.5 - 5.0 g/dL   AST 25 15 - 41 U/L   ALT 11 (L) 17 - 63 U/L   Alkaline Phosphatase 117 38 - 126 U/L   Total Bilirubin 0.6 0.3 - 1.2 mg/dL   Bilirubin, Direct 0.1 0.1 - 0.5 mg/dL   Indirect Bilirubin 0.5 0.3 - 0.9 mg/dL    Comment: Performed at [facility], [address], [place], [place] [address]  Differential     Status: Abnormal   Collection Time: 10/04/17  3:00 PM  Result Value Ref Range   Neutrophils Relative % 3 %   Lymphocytes Relative 94 %   Monocytes Relative 0 %   Eosinophils Relative 0 %   Basophils Relative 0 %   Band Neutrophils 3 %   Metamyelocytes Relative 0 %   Myelocytes 0 %   Promyelocytes Relative 0 %   Blasts 0 %   nRBC 0 0 /100 WBC   Other 0 %   Neutro Abs 15.1 (H) 1.7 - 7.7 K/uL   Lymphs Abs 237.0 (H) 0.7 - 4.0 K/uL   Monocytes Absolute 0.0 (L) 0.1 - 1.0 K/uL   Eosinophils Absolute 0.0 0.0 - 0.7 K/uL   Basophils Absolute 0.0 0.0 - 0.1 K/uL   WBC Morphology ATYPICAL LYMPHOCYTES     Comment: ABSOLUTE LYMPHOCYTOSIS Performed at [facility], [address], [place], [place] [address]   CBG monitoring, ED     Status: Abnormal   Collection Time: 10/04/17  3:09 PM  Result Value Ref Range   Glucose-Capillary 179 (H) 65  - 99 mg/dL  Type and screen [facility]     Status: None   Collection Time: 10/04/17  4:44 PM  Result Value Ref Range   ABO/RH(D) AB POS    Antibody Screen NEG    Sample Expiration 10/07/2017    Unit Number [id number]    Blood Component Type RBC, LR IRR    Unit division 00    Status of Unit ISSUED,FINAL    Transfusion Status OK TO TRANSFUSE    Crossmatch Result Compatible    Unit Number [id number]    Blood Component Type RBC LR PHER2    Unit division 00    Status of Unit ISSUED,FINAL    Transfusion Status OK TO TRANSFUSE    Crossmatch Result      Compatible Performed at [facility], [address], [place], [place] [address]   Urinalysis, Routine w reflex microscopic     Status: None   Collection Time: 10/04/17  5:55 PM  Result Value Ref Range   Color, Urine YELLOW YELLOW   APPearance CLEAR CLEAR   Specific Gravity, Urine 1.008 1.005 - 1.030   pH 6.0 5.0 - 8.0   Glucose, UA NEGATIVE NEGATIVE mg/dL   Hgb urine dipstick NEGATIVE NEGATIVE   Bilirubin Urine NEGATIVE NEGATIVE   Ketones, ur NEGATIVE NEGATIVE mg/dL   Protein, ur NEGATIVE NEGATIVE mg/dL   Nitrite NEGATIVE NEGATIVE   Leukocytes, UA NEGATIVE NEGATIVE    Comment: Performed at [facility], [address], [place], [place] [address]  Prepare RBC     Status: None   Collection Time: 10/04/17  7:24 PM  Result Value Ref Range   Order Confirmation      ORDER PROCESSED BY BLOOD BANK Performed at [facility], [address], [place], [place] [address]   Culture, blood (Routine X 2) w Reflex to ID Panel     Status: None (Preliminary result)   Collection Time: 10/04/17  7:47 PM  Result Value Ref Range   Specimen Description LEFT ANTECUBITAL    Special Requests      BOTTLES DRAWN AEROBIC AND ANAEROBIC Blood Culture adequate volume   Culture      NO GROWTH < 12 HOURS Performed at [facility], [address] [place], [place] [address]    Report Status PENDING   Lactic acid, plasma     Status: None    Collection Time: 10/04/17  7:47 PM  Result Value Ref Range   Lactic Acid, Venous 0.8 0.5 - 1.9 mmol/L    Comment: Performed at [facility], [address], [place], [place] [address]  Culture, blood (Routine X 2) w Reflex to ID Panel     Status: None (Preliminary result)   Collection Time: 10/04/17  7:48 PM  Result Value Ref Range   Specimen Description BLOOD RIGHT HAND    Special Requests      BOTTLES DRAWN AEROBIC AND ANAEROBIC Blood Culture adequate volume   Culture      NO GROWTH < 12 HOURS Performed at [facility], [address] [place], [place] [address]    Report Status PENDING   MRSA PCR Screening     Status: None   Collection Time: 10/04/17  9:46 PM  Result Value Ref Range   MRSA by PCR NEGATIVE NEGATIVE    Comment:        The GeneXpert MRSA Assay (FDA approved for NASAL specimens only), is one component of a comprehensive MRSA colonization surveillance program. It is not intended to diagnose MRSA infection nor to guide or monitor treatment for MRSA infections. Performed at [facility], [address], [place], [place] [address]   Glucose,  capillary     Status: Abnormal   Collection Time: 10/04/17 10:48 PM  Result Value Ref Range   Glucose-Capillary 152 (H) 65 - 99 mg/dL   Comment 1 Notify RN    Comment 2 Document in Chart   Basic metabolic panel     Status: Abnormal   Collection Time: 10/05/17  4:03 AM  Result Value Ref Range   Sodium 138 135 - 145 mmol/L   Potassium 4.9 3.5 - 5.1 mmol/L   Chloride 106 101 - 111 mmol/L   CO2 22 22 - 32 mmol/L   Glucose, Bld 119 (H) 65 - 99 mg/dL   BUN 26 (H) 6 - 20 mg/dL   Creatinine, Ser 1.70 (H) 0.61 - 1.24 mg/dL   Calcium 8.0 (L) 8.9 - 10.3 mg/dL   GFR calc non Af Amer 36 (L) >60 mL/min   GFR calc Af Amer 42 (L) >60 mL/min    Comment: (NOTE) The eGFR has been calculated using the CKD EPI equation. This calculation has not been validated in all clinical situations. eGFR's persistently <60 mL/min signify possible  Chronic Kidney Disease.    Anion gap 10 5 - 15    Comment: Performed at [facility], [address] [place] [address], [place], [place] [address]  CBC     Status: Abnormal   Collection Time: 10/05/17  4:03 AM  Result Value Ref Range   WBC 216.0 (HH) 4.0 - 10.5 K/uL    Comment: WHITE COUNT CONFIRMED ON SMEAR REPEATED TO VERIFY CRITICAL RESULT CALLED TO, READ BACK BY AND VERIFIED WITH: HOWARD,C@[id number]  BY MATTHEWS, B 5.25.2019    RBC 2.65 (L) 4.22 - 5.81 MIL/uL   Hemoglobin 8.7 (L) 13.0 - 17.0 g/dL    Comment: DELTA CHECK NOTED POST TRANSFUSION SPECIMEN    HCT 26.4 (L) 39.0 - 52.0 %   MCV 99.6 78.0 - 100.0 fL    Comment: DELTA CHECK NOTED POST TRANSFUSION SPECIMEN    MCH 32.8 26.0 - 34.0 pg   MCHC 33.0 30.0 - 36.0 g/dL   RDW 24.7 (H) 11.5 - 15.5 %   Platelets 53 (L) 150 - 400 K/uL    Comment: SPECIMEN CHECKED FOR CLOTS PLATELET COUNT CONFIRMED BY SMEAR Performed at [facility], [address] [place], [place] [address]   Glucose, capillary     Status: Abnormal   Collection Time: 10/05/17  7:22 AM  Result Value Ref Range   Glucose-Capillary 51 (L) 65 - 99 mg/dL  Glucose, capillary     Status: Abnormal   Collection Time: 10/05/17  7:48 AM  Result Value Ref Range   Glucose-Capillary 151 (H) 65 - 99 mg/dL    Dg Chest 2 View  Result Date: 10/04/2017 CLINICAL DATA:  Weakness EXAM: CHEST - 2 VIEW COMPARISON:  02/08/2017 chest radiograph. FINDINGS: Stable configuration of 2 lead left subclavian pacemaker. Right subclavian MediPort terminates in the lower third of the SVC. Stable cardiomediastinal silhouette with top-normal heart size. No pneumothorax. No pleural effusion. New patchy opacity in the posterior upper left lung. IMPRESSION: New patchy opacity in the posterior upper left lung, suggestive of a pneumonia. Recommend follow-up PA and lateral post treatment chest radiographs in 4-6 weeks. Electronically Signed   By: Ilona Sorrel M.D.   On: 10/04/2017 16:45   Ct Chest Wo Contrast  Result Date:  10/04/2017 CLINICAL DATA:  Hypotension and weakness. Dyspnea. History of chronic lymphocytic leukemia. EXAM: CT CHEST WITHOUT CONTRAST TECHNIQUE: Multidetector CT imaging of the chest was performed following the standard protocol without  IV contrast. COMPARISON:  Same day CXR and CXR from 02/08/2017, CT abdomen from 12/16/2015 FINDINGS: Cardiovascular: Mild aortic atherosclerosis without aneurysm. Conventional branch pattern of the great vessels. Cardiomegaly with coronary arteriosclerosis. Left-sided pacemaker apparatus with right atrial and right ventricular leads. Port catheter tip terminates in the distal SVC. Mediastinum/Nodes: Mediastinal and hilar lymphadenopathy are identified measuring up to 16 mm short axis along the right lower paratracheal portion of the mediastinum with a 14 mm AP window enlarged lymph node. Additional enlarged mediastinal lymph nodes are present, compatible with history of CLL. Patent trachea and mainstem bronchi. Esophagus is unremarkable. Small hiatal hernia is noted. Lungs/Pleura: As suggested on chest radiograph, confluent pulmonary opacity in the posterior segment of left upper lobe suspicious for pneumonia is noted. This can mask underlying pulmonary lesions. Follow-up after appropriate antibiotic therapy is recommended to exclude neoplasm. No effusion or pneumothorax. Upper Abdomen: Portacaval, retroperitoneal and gastroduodenal adenopathy measuring up to 16 mm in the porta hepatis are identified. No splenomegaly. Musculoskeletal: Chronic lower thoracic compression fractures are noted. No aggressive nor acute osseous appearing abnormality. IMPRESSION: 1. New confluent left upper lobe pulmonary consolidations suspicious for pneumonia. Followup PA and lateral chest X-ray is recommended in 3-4 weeks following trial of antibiotic therapy to ensure resolution and exclude underlying malignancy. 2. Mediastinal and abdominal adenopathy compatible with history of CLL. 3. Cardiomegaly  with coronary arteriosclerosis. Atherosclerotic nonaneurysmal aorta. Aortic Atherosclerosis (ICD10-I70.0). Electronically Signed   By: Ashley Royalty M.D.   On: 10/04/2017 19:04    ROS:  Pertinent items are noted in HPI.  Blood pressure (!) 102/59, pulse (!) 59, temperature (!) 97.5 F (36.4 C), temperature source Oral, resp. rate 15, height 5' 1"  (1.549 m), weight 117 lb 11.6 oz (53.4 kg), SpO2 96 %. Physical Exam: Pleasant white male no acute distress Head is normocephalic, atraumatic Large greater than 3 cm indurated area with erythema and easily expressible purulent drainage present. Heart examination reveals a regular rate and rhythm without S3, S4, murmurs  Labs reviewed  Assessment/Plan: Impression: Facial abscess with active drainage Plan: No need for surgical intervention at this time as the abscess cavity is freely draining.  Would continue expressing fluid from the abscess cavity.  Will start Bactroban ointment.  Will follow with you.  Randall Reyes 10/05/2017, 8:59 AM

## 2017-10-05 NOTE — Progress Notes (Signed)
PROGRESS NOTE                                                                                                                                                                                                             Patient Demographics:    Randall Reyes, is a 82 y.o. male, DOB - August 13, 1935, ESP:[id number]  Admit date - 10/04/2017   Admitting Physician Pratik Darleen Crocker, DO  Outpatient Primary MD for the patient is [name], MD  LOS - 1  Outpatient Specialists:[facility] ( oncology)  Chief Complaint  Patient presents with  . Weakness       Brief Narrative   82 year old male with CLL not on treatment, a flutter, CKD stage III, heart block status post dual-chamber pacemaker, diabetes mellitus, dyslipidemia and recent facial cellulitis who was being treated as outpatient (both by PCP followed by ENT) with antibiotic for the past 2 weeks presented to the ED with increasing weakness, poor p.o. intake for past few days.  His blood pressure was also low in the 70s to 80s.  Patient also reported mild productive cough for the same duration. In the ED he was septic with elevated leukocytosis of 250,000, baseline of 100 K), anemia (drop in hemoglobin to 6.8 from 10 of baseline) and thrombocytopenia (60 9K from baseline of 160 K).  Renal function were at baseline.  Chest x-ray and CT of the chest both showed left upper lobe pneumonia.  Patient ordered for 2 unit PRBC and started on empiric vancomycin and cefepime.    Subjective:   Blood pressure slightly better today and hemoglobin improved after PRBC transfusion.  Unit the nurse applied some pressure over the left cheek and got good amount of purulent tan drainage.  Patient complains of pain in his left face but denies having difficulties chewing or swallowing his food.   Assessment  & Plan :    Principal Problem: Systemic inflammatory response syndrome (HCC) Secondary to  lobar pneumonia and left facial cellulitis with abscess.  Continue empiric vancomycin and cefepime.  Leukocytosis improving.   Blood culture without any growth so far.  Right lobar pneumonia (HCC) Empiric vancomycin and cefepime.  Follow blood culture (negative growth so far).  Follow sputum culture.  Antitussives as needed.  Left facial cellulitis with abscess. Continue empiric antibiotics.  Abscess cavity is freely draining  recommends continuing expressing pus from the cavity and empiric antibiotics.  No need for surgical intervention for now.  Ordered Bactroban ointment.  I will order some Vicodin for pain.    CLL (chronic lymphocytic leukemia) (HCC) Worsened leukocytosis possibly due to sepsis.  Hospitalist and ED physician discussed with oncologist on admission.  Monitor WBC.  Anemia and thrombocytopenia Possibly due to sepsis.  Received 2 unit PRBC with improvement in hemoglobin.  Monitor platelets.     CKD (chronic kidney disease), stage III ([place]) Renal function at baseline.  Avoid nephrotoxins.   Generalized weakness and failure to thrive. PT and nutrition evaluation.  Diabetes mellitus type 2 CBG stable Monitor on sliding scale coverage.  History of a flutter and heart block Has dual-chamber pacemaker.  Not on anticoagulation due to thrombus cytopenia  Code Status : Full code  Family Communication  : Wife at bedside  Disposition Plan  : Home once clinically improved.  Monitor on stepdown  Barriers For Discharge : Active symptoms  Consults  : Surgery, discussed with oncologist on the phone  Procedures  : CT chest without contrast  DVT Prophylaxis  : SCDs  Lab Results  Component Value Date   PLT 53 (L) 10/05/2017    Antibiotics  :    Anti-infectives (From admission, onward)   Start     Dose/Rate Route Frequency Ordered Stop   10/06/17 2000  vancomycin (VANCOCIN) IVPB 750 mg/150 ml premix  Status:  Discontinued     750 mg 150 mL/hr over 60 Minutes  Intravenous Every 48 hours 10/04/17 2130 10/05/17 0840   10/06/17 2000  vancomycin (VANCOCIN) IVPB 750 mg/150 ml premix     750 mg 150 mL/hr over 60 Minutes Intravenous Every 48 hours 10/05/17 0842     10/05/17 1800  ceFEPIme (MAXIPIME) 1 g in sodium chloride 0.9 % 100 mL IVPB     1 g 200 mL/hr over 30 Minutes Intravenous Every 24 hours 10/04/17 2130     10/04/17 2200  valACYclovir (VALTREX) tablet 500 mg     500 mg Oral 2 times daily 10/04/17 2026     10/04/17 1945  vancomycin (VANCOCIN) IVPB 1000 mg/200 mL premix  Status:  Discontinued     1,000 mg 200 mL/hr over 60 Minutes Intravenous  Once 10/04/17 1937 10/04/17 2110   10/04/17 1930  ceFEPIme (MAXIPIME) 1 g in sodium chloride 0.9 % 100 mL IVPB     1 g 200 mL/hr over 30 Minutes Intravenous  Once 10/04/17 1925 10/04/17 2021        Objective:   Vitals:   10/05/17 0645 10/05/17 0700 10/05/17 0720 10/05/17 0800  BP: 92/62 100/60  (!) 102/59  Pulse: (!) 59 (!) 59 60 (!) 59  Resp: 14 15 13 15   Temp:   (!) 97.5 F (36.4 C)   TempSrc:   Oral   SpO2: 93% 97% 96% 96%  Weight:      Height:        Wt Readings from Last 3 Encounters:  10/05/17 53.4 kg (117 lb 11.6 oz)  10/01/17 54.4 kg (120 lb)  09/11/17 56.7 kg (125 lb)     Intake/Output Summary (Last 24 hours) at 10/05/2017 1110 Last data filed at 10/05/2017 1000 Gross per 24 hour  Intake 2165.05 ml  Output 670 ml  Net 1495.05 ml     Physical Exam  Gen: Elderly male appears fatigued HEENT: Pallor present moist mucosa, supple neck, firm swelling over left side of the face with purulent  drainage, tender to pressure Chest: clear b/l, no added sounds CVS: N S1&S2, no murmurs, GI: soft, NT, ND, BS+ Musculoskeletal: warm, no edema     Data Review:    CBC Recent Labs  Lab 10/04/17 1500 10/05/17 0403  WBC 252.1* 216.0*  HGB 6.8* 8.7*  HCT 21.0* 26.4*  PLT 69* 53*  MCV 116.0* 99.6  MCH 37.6* 32.8  MCHC 32.4 33.0  RDW 18.4* 24.7*  LYMPHSABS 237.0*  --     MONOABS 0.0*  --   EOSABS 0.0  --   BASOSABS 0.0  --     Chemistries  Recent Labs  Lab 10/04/17 1500 10/05/17 0403  NA 133* 138  K 4.6 4.9  CL 98* 106  CO2 25 22  GLUCOSE 189* 119*  BUN 32* 26*  CREATININE 2.16* 1.70*  CALCIUM 8.5* 8.0*  AST 25  --   ALT 11*  --   ALKPHOS 117  --   BILITOT 0.6  --    ------------------------------------------------------------------------------------------------------------------ No results for input(s): CHOL, HDL, LDLCALC, TRIG, CHOLHDL, LDLDIRECT in the last 72 hours.  Lab Results  Component Value Date   HGBA1C 6.5 (H) 05/24/2016   ------------------------------------------------------------------------------------------------------------------ No results for input(s): TSH, T4TOTAL, T3FREE, THYROIDAB in the last 72 hours.  Invalid input(s): FREET3 ------------------------------------------------------------------------------------------------------------------ No results for input(s): VITAMINB12, FOLATE, FERRITIN, TIBC, IRON, RETICCTPCT in the last 72 hours.  Coagulation profile No results for input(s): INR, PROTIME in the last 168 hours.  No results for input(s): DDIMER in the last 72 hours.  Cardiac Enzymes No results for input(s): CKMB, TROPONINI, MYOGLOBIN in the last 168 hours.  Invalid input(s): CK ------------------------------------------------------------------------------------------------------------------ No results found for: BNP  Inpatient Medications  Scheduled Meds: . cholecalciferol  400 Units Oral Daily  . feeding supplement (ENSURE ENLIVE)  237 mL Oral BID BM  . insulin aspart  0-5 Units Subcutaneous QHS  . insulin aspart  0-9 Units Subcutaneous TID WC  . levETIRAcetam  750 mg Oral BID  . linagliptin  5 mg Oral Daily  . mupirocin ointment   Topical BID  . rosuvastatin  40 mg Oral QHS  . sevelamer carbonate  800 mg Oral TID WC  . sodium bicarbonate  650 mg Oral BID  . valACYclovir  500 mg Oral BID   . vitamin C  250 mg Oral Daily   Continuous Infusions: . sodium chloride 75 mL/hr at 10/05/17 0238  . ceFEPime (MAXIPIME) IV    . [START ON 10/06/2017] vancomycin     PRN Meds:.acetaminophen **OR** acetaminophen, docusate sodium, magic mouthwash **AND** lidocaine, ondansetron **OR** ondansetron (ZOFRAN) IV  Micro Results Recent Results (from the past 240 hour(s))  Culture, blood (Routine X 2) w Reflex to ID Panel     Status: None (Preliminary result)   Collection Time: 10/04/17  7:47 PM  Result Value Ref Range Status   Specimen Description LEFT ANTECUBITAL  Final   Special Requests   Final    BOTTLES DRAWN AEROBIC AND ANAEROBIC Blood Culture adequate volume   Culture   Final    NO GROWTH < 12 HOURS Performed at [facility], [address], [place], [place] [address]    Report Status PENDING  Incomplete  Culture, blood (Routine X 2) w Reflex to ID Panel     Status: None (Preliminary result)   Collection Time: 10/04/17  7:48 PM  Result Value Ref Range Status   Specimen Description BLOOD RIGHT HAND  Final   Special Requests   Final    BOTTLES DRAWN  AEROBIC AND ANAEROBIC Blood Culture adequate volume   Culture   Final    NO GROWTH < 12 HOURS Performed at [facility], [address], [place], [place] [address]    Report Status PENDING  Incomplete  MRSA PCR Screening     Status: None   Collection Time: 10/04/17  9:46 PM  Result Value Ref Range Status   MRSA by PCR NEGATIVE NEGATIVE Final    Comment:        The GeneXpert MRSA Assay (FDA approved for NASAL specimens only), is one component of a comprehensive MRSA colonization surveillance program. It is not intended to diagnose MRSA infection nor to guide or monitor treatment for MRSA infections. Performed at [facility], [address] [place], [place] [address]     Radiology Reports Dg Chest 2 View  Result Date: 10/04/2017 CLINICAL DATA:  Weakness EXAM: CHEST - 2 VIEW COMPARISON:  02/08/2017 chest radiograph.  FINDINGS: Stable configuration of 2 lead left subclavian pacemaker. Right subclavian MediPort terminates in the lower third of the SVC. Stable cardiomediastinal silhouette with top-normal heart size. No pneumothorax. No pleural effusion. New patchy opacity in the posterior upper left lung. IMPRESSION: New patchy opacity in the posterior upper left lung, suggestive of a pneumonia. Recommend follow-up PA and lateral post treatment chest radiographs in 4-6 weeks. Electronically Signed   By: Ilona Sorrel M.D.   On: 10/04/2017 16:45   Ct Chest Wo Contrast  Result Date: 10/04/2017 CLINICAL DATA:  Hypotension and weakness. Dyspnea. History of chronic lymphocytic leukemia. EXAM: CT CHEST WITHOUT CONTRAST TECHNIQUE: Multidetector CT imaging of the chest was performed following the standard protocol without IV contrast. COMPARISON:  Same day CXR and CXR from 02/08/2017, CT abdomen from 12/16/2015 FINDINGS: Cardiovascular: Mild aortic atherosclerosis without aneurysm. Conventional branch pattern of the great vessels. Cardiomegaly with coronary arteriosclerosis. Left-sided pacemaker apparatus with right atrial and right ventricular leads. Port catheter tip terminates in the distal SVC. Mediastinum/Nodes: Mediastinal and hilar lymphadenopathy are identified measuring up to 16 mm short axis along the right lower paratracheal portion of the mediastinum with a 14 mm AP window enlarged lymph node. Additional enlarged mediastinal lymph nodes are present, compatible with history of CLL. Patent trachea and mainstem bronchi. Esophagus is unremarkable. Small hiatal hernia is noted. Lungs/Pleura: As suggested on chest radiograph, confluent pulmonary opacity in the posterior segment of left upper lobe suspicious for pneumonia is noted. This can mask underlying pulmonary lesions. Follow-up after appropriate antibiotic therapy is recommended to exclude neoplasm. No effusion or pneumothorax. Upper Abdomen: Portacaval, retroperitoneal and  gastroduodenal adenopathy measuring up to 16 mm in the porta hepatis are identified. No splenomegaly. Musculoskeletal: Chronic lower thoracic compression fractures are noted. No aggressive nor acute osseous appearing abnormality. IMPRESSION: 1. New confluent left upper lobe pulmonary consolidations suspicious for pneumonia. Followup PA and lateral chest X-ray is recommended in 3-4 weeks following trial of antibiotic therapy to ensure resolution and exclude underlying malignancy. 2. Mediastinal and abdominal adenopathy compatible with history of CLL. 3. Cardiomegaly with coronary arteriosclerosis. Atherosclerotic nonaneurysmal aorta. Aortic Atherosclerosis (ICD10-I70.0). Electronically Signed   By: Ashley Royalty M.D.   On: 10/04/2017 19:04    Time Spent in minutes 35   Yarielys Beed M.D on 10/05/2017 at 11:10 AM  Between 7am to 7pm - Pager - [phone]  After 7pm go to [email or web address] - password [facility]  [facility] -  Office  [phone]

## 2017-10-06 ENCOUNTER — Encounter (HOSPITAL_COMMUNITY): Payer: Self-pay

## 2017-10-06 DIAGNOSIS — N183 Chronic kidney disease, stage 3 (moderate): Secondary | ICD-10-CM

## 2017-10-06 DIAGNOSIS — R112 Nausea with vomiting, unspecified: Secondary | ICD-10-CM

## 2017-10-06 LAB — CBC
HCT: 29.2 % — ABNORMAL LOW (ref 39.0–52.0)
Hemoglobin: 8.9 g/dL — ABNORMAL LOW (ref 13.0–17.0)
MCH: 32.1 pg (ref 26.0–34.0)
MCHC: 30.5 g/dL (ref 30.0–36.0)
MCV: 105.4 fL — ABNORMAL HIGH (ref 78.0–100.0)
PLATELETS: 57 10*3/uL — AB (ref 150–400)
RBC: 2.77 MIL/uL — ABNORMAL LOW (ref 4.22–5.81)
RDW: 25.1 % — AB (ref 11.5–15.5)
WBC: 200.1 10*3/uL (ref 4.0–10.5)

## 2017-10-06 LAB — LEGIONELLA PNEUMOPHILA SEROGP 1 UR AG: L. PNEUMOPHILA SEROGP 1 UR AG: NEGATIVE

## 2017-10-06 LAB — BASIC METABOLIC PANEL
Anion gap: 7 (ref 5–15)
BUN: 25 mg/dL — AB (ref 6–20)
CO2: 23 mmol/L (ref 22–32)
Calcium: 8.1 mg/dL — ABNORMAL LOW (ref 8.9–10.3)
Chloride: 107 mmol/L (ref 101–111)
Creatinine, Ser: 1.43 mg/dL — ABNORMAL HIGH (ref 0.61–1.24)
GFR calc Af Amer: 52 mL/min — ABNORMAL LOW (ref >60)
GFR calc non Af Amer: 45 mL/min — ABNORMAL LOW (ref >60)
Glucose, Bld: 134 mg/dL — ABNORMAL HIGH (ref 65–99)
Potassium: 5.2 mmol/L — ABNORMAL HIGH (ref 3.5–5.1)
SODIUM: 137 mmol/L (ref 135–145)

## 2017-10-06 LAB — GLUCOSE, CAPILLARY
GLUCOSE-CAPILLARY: 146 mg/dL — AB (ref 65–99)
Glucose-Capillary: 124 mg/dL — ABNORMAL HIGH (ref 65–99)
Glucose-Capillary: 168 mg/dL — ABNORMAL HIGH (ref 65–99)
Glucose-Capillary: 163 mg/dL — ABNORMAL HIGH (ref 65–99)

## 2017-10-06 NOTE — Progress Notes (Signed)
PROGRESS NOTE                                                                                                                                                                                                             Patient Demographics:    Randall Reyes, is a 82 y.o. male, DOB - [birth year], YQM:[id number]  Admit date - 10/04/2017   Admitting Physician Pratik Darleen Crocker, DO  Outpatient Primary MD for the patient is [name], MD  LOS - 2  Outpatient Specialists:Dr [facility] ( oncology)  Chief Complaint  Patient presents with  . Weakness       Brief Narrative   82 year old male with CLL not on treatment, a flutter, CKD stage III, heart block status post dual-chamber pacemaker, diabetes mellitus, dyslipidemia and recent facial cellulitis who was being treated as outpatient (both by PCP followed by ENT) with antibiotic for the past 2 weeks presented to the ED with increasing weakness, poor p.o. intake for past few days.  His blood pressure was also low in the 70s to 80s.  Patient also reported mild productive cough for the same duration. In the ED he was septic with elevated leukocytosis of 250,000, baseline of 100 K), anemia (drop in hemoglobin to 6.8 from 10 of baseline) and thrombocytopenia (60 9K from baseline of 160 K).  Renal function were at baseline.  Chest x-ray and CT of the chest both showed left upper lobe pneumonia.  Patient ordered for 2 unit PRBC and started on empiric vancomycin and cefepime.    Subjective:   Patient reports being nauseous and threw up twice.  Denies any abdominal pain.  Blood pressure remains stable overnight.  Left facial pain better.   Assessment  & Plan :    Principal Problem: Systemic inflammatory response syndrome (HCC) Secondary to lobar pneumonia and left facial cellulitis with abscess.  Continue empiric vancomycin and cefepime.  Leukocytosis improving.   Blood culture  without any growth so far.  Right lobar pneumonia (HCC) Empiric vancomycin and cefepime.  Blood cultures so far without any growth.  Left facial cellulitis with abscess. Continue empiric antibiotics.  Abscess cavity is freely draining and surgery recommend continuing expressing pus from the cavity and empiric antibiotics.  No need for surgical intervention for now.  Ordered Bactroban ointment.  Pain controlled with Vicodin. Surgery consult appreciated.  Nausea and vomiting Could be due to underlying infection versus narcotic.  PRN Zofran.    CLL (chronic lymphocytic leukemia) (HCC) Worsened leukocytosis possibly due to sepsis.  Slowly improving.  Hospitalist and ED physician discussed with oncologist on admission.    Anemia and thrombocytopenia Possibly due to sepsis.  Received 2 unit PRBC with improvement in hemoglobin.  Monitor platelets.     CKD (chronic kidney disease), stage III ([place]) Renal function letter from baseline.  Avoid nephrotoxins.  Hyperkalemia Mild.  Monitor for now.  Stable on telemetry.    Generalized weakness and failure to thrive. PT and nutrition evaluation.  Diabetes mellitus type 2 CBG stable Monitor on sliding scale coverage.  History of a flutter and heart block Has dual-chamber pacemaker.  Not on anticoagulation due to thrombus cytopenia  Code Status : Full code  Family Communication  : Wife at bedside  Disposition Plan  : Transfer to telemetry.  Home once clinically improved.  Barriers For Discharge : Active symptoms  Consults  : Surgery, discussed with oncologist on the phone  Procedures  : CT chest without contrast  DVT Prophylaxis  : SCDs  Lab Results  Component Value Date   PLT 57 (L) 10/06/2017    Antibiotics  :    Anti-infectives (From admission, onward)   Start     Dose/Rate Route Frequency Ordered Stop   10/06/17 2000  vancomycin (VANCOCIN) IVPB 750 mg/150 ml premix  Status:  Discontinued     750 mg 150 mL/hr over 60  Minutes Intravenous Every 48 hours 10/04/17 2130 10/05/17 0840   10/06/17 2000  vancomycin (VANCOCIN) IVPB 750 mg/150 ml premix     750 mg 150 mL/hr over 60 Minutes Intravenous Every 48 hours 10/05/17 0842     10/05/17 1800  ceFEPIme (MAXIPIME) 1 g in sodium chloride 0.9 % 100 mL IVPB     1 g 200 mL/hr over 30 Minutes Intravenous Every 24 hours 10/04/17 2130     10/04/17 2200  valACYclovir (VALTREX) tablet 500 mg     500 mg Oral 2 times daily 10/04/17 2026     10/04/17 1945  vancomycin (VANCOCIN) IVPB 1000 mg/200 mL premix  Status:  Discontinued     1,000 mg 200 mL/hr over 60 Minutes Intravenous  Once 10/04/17 1937 10/04/17 2110   10/04/17 1930  ceFEPIme (MAXIPIME) 1 g in sodium chloride 0.9 % 100 mL IVPB     1 g 200 mL/hr over 30 Minutes Intravenous  Once 10/04/17 1925 10/04/17 2021        Objective:   Vitals:   10/06/17 0700 10/06/17 0735 10/06/17 0800 10/06/17 0900  BP: 111/60  116/63 110/68  Pulse: 61 64 85 63  Resp: 14 16 15 16   Temp:  97.9 F (36.6 C)    TempSrc:  Oral    SpO2: 95% 98% 97% 96%  Weight:      Height:        Wt Readings from Last 3 Encounters:  10/06/17 56.3 kg (124 lb 1.9 oz)  10/01/17 54.4 kg (120 lb)  09/11/17 56.7 kg (125 lb)     Intake/Output Summary (Last 24 hours) at 10/06/2017 1113 Last data filed at 10/06/2017 0900 Gross per 24 hour  Intake 2665 ml  Output 1570 ml  Net 1095 ml    Physical exam Fatigued, not in distress HEENT: Pallor present, moist mucosa, firm swelling over left side of the face with purulent drainage (swelling and tenderness improved from yesterday) Chest: Clear bilaterally CVS:  Normal S1-S2, no murmurs GI: Soft, nondistended, nontender Musculoskeletal: Warm, no edema     Data Review:    CBC Recent Labs  Lab 10/04/17 1500 10/05/17 0403 10/06/17 0451  WBC 252.1* 216.0* 200.1*  HGB 6.8* 8.7* 8.9*  HCT 21.0* 26.4* 29.2*  PLT 69* 53* 57*  MCV 116.0* 99.6 105.4*  MCH 37.6* 32.8 32.1  MCHC 32.4 33.0 30.5    RDW 18.4* 24.7* 25.1*  LYMPHSABS 237.0*  --   --   MONOABS 0.0*  --   --   EOSABS 0.0  --   --   BASOSABS 0.0  --   --     Chemistries  Recent Labs  Lab 10/04/17 1500 10/05/17 0403 10/06/17 0451  NA 133* 138 137  K 4.6 4.9 5.2*  CL 98* 106 107  CO2 25 22 23   GLUCOSE 189* 119* 134*  BUN 32* 26* 25*  CREATININE 2.16* 1.70* 1.43*  CALCIUM 8.5* 8.0* 8.1*  AST 25  --   --   ALT 11*  --   --   ALKPHOS 117  --   --   BILITOT 0.6  --   --    ------------------------------------------------------------------------------------------------------------------ No results for input(s): CHOL, HDL, LDLCALC, TRIG, CHOLHDL, LDLDIRECT in the last 72 hours.  Lab Results  Component Value Date   HGBA1C 6.5 (H) 05/24/2016   ------------------------------------------------------------------------------------------------------------------ No results for input(s): TSH, T4TOTAL, T3FREE, THYROIDAB in the last 72 hours.  Invalid input(s): FREET3 ------------------------------------------------------------------------------------------------------------------ No results for input(s): VITAMINB12, FOLATE, FERRITIN, TIBC, IRON, RETICCTPCT in the last 72 hours.  Coagulation profile No results for input(s): INR, PROTIME in the last 168 hours.  No results for input(s): DDIMER in the last 72 hours.  Cardiac Enzymes No results for input(s): CKMB, TROPONINI, MYOGLOBIN in the last 168 hours.  Invalid input(s): CK ------------------------------------------------------------------------------------------------------------------ No results found for: BNP  Inpatient Medications  Scheduled Meds: . cholecalciferol  400 Units Oral Daily  . feeding supplement (ENSURE ENLIVE)  237 mL Oral BID BM  . insulin aspart  0-5 Units Subcutaneous QHS  . insulin aspart  0-9 Units Subcutaneous TID WC  . levETIRAcetam  750 mg Oral BID  . linagliptin  5 mg Oral Daily  . mupirocin ointment   Topical BID  .  rosuvastatin  40 mg Oral QHS  . sevelamer carbonate  800 mg Oral TID WC  . sodium bicarbonate  650 mg Oral BID  . valACYclovir  500 mg Oral BID  . vitamin C  250 mg Oral Daily   Continuous Infusions: . sodium chloride 75 mL/hr at 10/06/17 0448  . ceFEPime (MAXIPIME) IV 1 g (10/05/17 1730)  . vancomycin     PRN Meds:.acetaminophen **OR** acetaminophen, docusate sodium, HYDROcodone-acetaminophen, magic mouthwash **AND** lidocaine, ondansetron **OR** ondansetron (ZOFRAN) IV  Micro Results Recent Results (from the past 240 hour(s))  Culture, blood (Routine X 2) w Reflex to ID Panel     Status: None (Preliminary result)   Collection Time: 10/04/17  7:47 PM  Result Value Ref Range Status   Specimen Description LEFT ANTECUBITAL  Final   Special Requests   Final    BOTTLES DRAWN AEROBIC AND ANAEROBIC Blood Culture adequate volume   Culture   Final    NO GROWTH 2 DAYS Performed at [facility], [name], [place], [place] [address]    Report Status PENDING  Incomplete  Culture, blood (Routine X 2) w Reflex to ID Panel     Status: None (Preliminary result)   Collection Time:  10/04/17  7:48 PM  Result Value Ref Range Status   Specimen Description BLOOD RIGHT HAND  Final   Special Requests   Final    BOTTLES DRAWN AEROBIC AND ANAEROBIC Blood Culture adequate volume   Culture   Final    NO GROWTH 2 DAYS Performed at [facility], [address], [place], [place] [address]    Report Status PENDING  Incomplete  MRSA PCR Screening     Status: None   Collection Time: 10/04/17  9:46 PM  Result Value Ref Range Status   MRSA by PCR NEGATIVE NEGATIVE Final    Comment:        The GeneXpert MRSA Assay (FDA approved for NASAL specimens only), is one component of a comprehensive MRSA colonization surveillance program. It is not intended to diagnose MRSA infection nor to guide or monitor treatment for MRSA infections. Performed at [facility], [address], [place], [place] [address]     Aerobic Culture (superficial specimen)     Status: None (Preliminary result)   Collection Time: 10/05/17 10:45 AM  Result Value Ref Range Status   Specimen Description   Final    FACE LEFT CHEEK Performed at [facility], [address] [place], [place] [address]    Special Requests   Final    Normal Performed at [facility], [address] [place], [place] [address]    Gram Stain   Final    RARE WBC PRESENT, PREDOMINANTLY PMN NO ORGANISMS SEEN Performed at [facility] [address] [place], [place] [address]    Culture PENDING  Incomplete   Report Status PENDING  Incomplete    Radiology Reports Dg Chest 2 View  Result Date: 10/04/2017 CLINICAL DATA:  Weakness EXAM: CHEST - 2 VIEW COMPARISON:  02/08/2017 chest radiograph. FINDINGS: Stable configuration of 2 lead left subclavian pacemaker. Right subclavian MediPort terminates in the lower third of the SVC. Stable cardiomediastinal silhouette with top-normal heart size. No pneumothorax. No pleural effusion. New patchy opacity in the posterior upper left lung. IMPRESSION: New patchy opacity in the posterior upper left lung, suggestive of a pneumonia. Recommend follow-up PA and lateral post treatment chest radiographs in 4-6 weeks. Electronically Signed   By: Ilona Sorrel M.D.   On: 10/04/2017 16:45   Ct Chest Wo Contrast  Result Date: 10/04/2017 CLINICAL DATA:  Hypotension and weakness. Dyspnea. History of chronic lymphocytic leukemia. EXAM: CT CHEST WITHOUT CONTRAST TECHNIQUE: Multidetector CT imaging of the chest was performed following the standard protocol without IV contrast. COMPARISON:  Same day CXR and CXR from 02/08/2017, CT abdomen from 12/16/2015 FINDINGS: Cardiovascular: Mild aortic atherosclerosis without aneurysm. Conventional branch pattern of the great vessels. Cardiomegaly with coronary arteriosclerosis. Left-sided pacemaker apparatus with right atrial and right ventricular leads. Port catheter tip terminates in  the distal SVC. Mediastinum/Nodes: Mediastinal and hilar lymphadenopathy are identified measuring up to 16 mm short axis along the right lower paratracheal portion of the mediastinum with a 14 mm AP window enlarged lymph node. Additional enlarged mediastinal lymph nodes are present, compatible with history of CLL. Patent trachea and mainstem bronchi. Esophagus is unremarkable. Small hiatal hernia is noted. Lungs/Pleura: As suggested on chest radiograph, confluent pulmonary opacity in the posterior segment of left upper lobe suspicious for pneumonia is noted. This can mask underlying pulmonary lesions. Follow-up after appropriate antibiotic therapy is recommended to exclude neoplasm. No effusion or pneumothorax. Upper Abdomen: Portacaval, retroperitoneal and gastroduodenal adenopathy measuring up to 16 mm in the porta hepatis are  identified. No splenomegaly. Musculoskeletal: Chronic lower thoracic compression fractures are noted. No aggressive nor acute osseous appearing abnormality. IMPRESSION: 1. New confluent left upper lobe pulmonary consolidations suspicious for pneumonia. Followup PA and lateral chest X-ray is recommended in 3-4 weeks following trial of antibiotic therapy to ensure resolution and exclude underlying malignancy. 2. Mediastinal and abdominal adenopathy compatible with history of CLL. 3. Cardiomegaly with coronary arteriosclerosis. Atherosclerotic nonaneurysmal aorta. Aortic Atherosclerosis (ICD10-I70.0). Electronically Signed   By: Ashley Royalty M.D.   On: 10/04/2017 19:04    Time Spent in minutes 25   Yessenia Maillet M.D on 10/06/2017 at 11:12 AM  Between 7am to 7pm - Pager - [phone]  After 7pm go to [email or web address] - password [facility]  [facility] -  Office  [phone]

## 2017-10-06 NOTE — Progress Notes (Signed)
Subjective: No complaints.  Family states facial abscess improving.  Objective: Vital signs in last 24 hours: Temp:  [97.9 F (36.6 C)-99.1 F (37.3 C)] 97.9 F (36.6 C) (05/26 0735) Pulse Rate:  [35-85] 63 (05/26 0900) Resp:  [14-21] 16 (05/26 0900) BP: (96-123)/(52-73) 110/68 (05/26 0900) SpO2:  [70 %-100 %] 96 % (05/26 0900) Weight:  [124 lb 1.9 oz (56.3 kg)] 124 lb 1.9 oz (56.3 kg) (05/26 0500) Last BM Date: 10/03/17  Intake/Output from previous day: 05/25 0701 - 05/26 0700 In: 2875 [P.O.:840; I.V.:1935; IV Piggyback:100] Out: 0272 [Urine:1395] Intake/Output this shift: Total I/O In: 1365 [P.O.:240; I.V.:1125] Out: 375 [Urine:375]  General appearance: alert, cooperative, appears stated age and no distress Head: Abscess on left side of face with less swelling and drainage present.  Lab Results:  Recent Labs    10/05/17 0403 10/06/17 0451  WBC 216.0* 200.1*  HGB 8.7* 8.9*  HCT 26.4* 29.2*  PLT 53* 57*   BMET Recent Labs    10/05/17 0403 10/06/17 0451  NA 138 137  K 4.9 5.2*  CL 106 107  CO2 22 23  GLUCOSE 119* 134*  BUN 26* 25*  CREATININE 1.70* 1.43*  CALCIUM 8.0* 8.1*   PT/INR No results for input(s): LABPROT, INR in the last 72 hours.  Studies/Results: Dg Chest 2 View  Result Date: 10/04/2017 CLINICAL DATA:  Weakness EXAM: CHEST - 2 VIEW COMPARISON:  02/08/2017 chest radiograph. FINDINGS: Stable configuration of 2 lead left subclavian pacemaker. Right subclavian MediPort terminates in the lower third of the SVC. Stable cardiomediastinal silhouette with top-normal heart size. No pneumothorax. No pleural effusion. New patchy opacity in the posterior upper left lung. IMPRESSION: New patchy opacity in the posterior upper left lung, suggestive of a pneumonia. Recommend follow-up PA and lateral post treatment chest radiographs in 4-6 weeks. Electronically Signed   By: Ilona Sorrel M.D.   On: 10/04/2017 16:45   Ct Chest Wo Contrast  Result Date:  10/04/2017 CLINICAL DATA:  Hypotension and weakness. Dyspnea. History of chronic lymphocytic leukemia. EXAM: CT CHEST WITHOUT CONTRAST TECHNIQUE: Multidetector CT imaging of the chest was performed following the standard protocol without IV contrast. COMPARISON:  Same day CXR and CXR from 02/08/2017, CT abdomen from 12/16/2015 FINDINGS: Cardiovascular: Mild aortic atherosclerosis without aneurysm. Conventional branch pattern of the great vessels. Cardiomegaly with coronary arteriosclerosis. Left-sided pacemaker apparatus with right atrial and right ventricular leads. Port catheter tip terminates in the distal SVC. Mediastinum/Nodes: Mediastinal and hilar lymphadenopathy are identified measuring up to 16 mm short axis along the right lower paratracheal portion of the mediastinum with a 14 mm AP window enlarged lymph node. Additional enlarged mediastinal lymph nodes are present, compatible with history of CLL. Patent trachea and mainstem bronchi. Esophagus is unremarkable. Small hiatal hernia is noted. Lungs/Pleura: As suggested on chest radiograph, confluent pulmonary opacity in the posterior segment of left upper lobe suspicious for pneumonia is noted. This can mask underlying pulmonary lesions. Follow-up after appropriate antibiotic therapy is recommended to exclude neoplasm. No effusion or pneumothorax. Upper Abdomen: Portacaval, retroperitoneal and gastroduodenal adenopathy measuring up to 16 mm in the porta hepatis are identified. No splenomegaly. Musculoskeletal: Chronic lower thoracic compression fractures are noted. No aggressive nor acute osseous appearing abnormality. IMPRESSION: 1. New confluent left upper lobe pulmonary consolidations suspicious for pneumonia. Followup PA and lateral chest X-ray is recommended in 3-4 weeks following trial of antibiotic therapy to ensure resolution and exclude underlying malignancy. 2. Mediastinal and abdominal adenopathy compatible with history of CLL. 3. Cardiomegaly  with coronary arteriosclerosis. Atherosclerotic nonaneurysmal aorta. Aortic Atherosclerosis (ICD10-I70.0). Electronically Signed   By: Ashley Royalty M.D.   On: 10/04/2017 19:04    Anti-infectives: Anti-infectives (From admission, onward)   Start     Dose/Rate Route Frequency Ordered Stop   10/06/17 2000  vancomycin (VANCOCIN) IVPB 750 mg/150 ml premix  Status:  Discontinued     750 mg 150 mL/hr over 60 Minutes Intravenous Every 48 hours 10/04/17 2130 10/05/17 0840   10/06/17 2000  vancomycin (VANCOCIN) IVPB 750 mg/150 ml premix     750 mg 150 mL/hr over 60 Minutes Intravenous Every 48 hours 10/05/17 0842     10/05/17 1800  ceFEPIme (MAXIPIME) 1 g in sodium chloride 0.9 % 100 mL IVPB     1 g 200 mL/hr over 30 Minutes Intravenous Every 24 hours 10/04/17 2130     10/04/17 2200  valACYclovir (VALTREX) tablet 500 mg     500 mg Oral 2 times daily 10/04/17 2026     10/04/17 1945  vancomycin (VANCOCIN) IVPB 1000 mg/200 mL premix  Status:  Discontinued     1,000 mg 200 mL/hr over 60 Minutes Intravenous  Once 10/04/17 1937 10/04/17 2110   10/04/17 1930  ceFEPIme (MAXIPIME) 1 g in sodium chloride 0.9 % 100 mL IVPB     1 g 200 mL/hr over 30 Minutes Intravenous  Once 10/04/17 1925 10/04/17 2021      Assessment/Plan: Imp:  Left facial abscess, resolving.  Initial gram stain negative for organisms. Plan:  Continue current management.  No need for surgical intervention.  LOS: 2 days    Aviva Signs 10/06/2017

## 2017-10-07 DIAGNOSIS — D61818 Other pancytopenia: Secondary | ICD-10-CM

## 2017-10-07 LAB — GLUCOSE, CAPILLARY
GLUCOSE-CAPILLARY: 155 mg/dL — AB (ref 65–99)
GLUCOSE-CAPILLARY: 131 mg/dL — AB (ref 65–99)
GLUCOSE-CAPILLARY: 147 mg/dL — AB (ref 65–99)
Glucose-Capillary: 105 mg/dL — ABNORMAL HIGH (ref 65–99)

## 2017-10-07 LAB — BASIC METABOLIC PANEL
ANION GAP: 4 — AB (ref 5–15)
BUN: 20 mg/dL (ref 6–20)
CO2: 24 mmol/L (ref 22–32)
Calcium: 7.8 mg/dL — ABNORMAL LOW (ref 8.9–10.3)
Chloride: 107 mmol/L (ref 101–111)
Creatinine, Ser: 1.36 mg/dL — ABNORMAL HIGH (ref 0.61–1.24)
GFR, EST AFRICAN AMERICAN: 55 mL/min — AB (ref >60)
GFR, EST NON AFRICAN AMERICAN: 48 mL/min — AB (ref >60)
GLUCOSE: 115 mg/dL — AB (ref 65–99)
POTASSIUM: 4.8 mmol/L (ref 3.5–5.1)
Sodium: 135 mmol/L (ref 135–145)

## 2017-10-07 LAB — CBC
HEMATOCRIT: 29.2 % — AB (ref 39.0–52.0)
Hemoglobin: 8.6 g/dL — ABNORMAL LOW (ref 13.0–17.0)
MCH: 31.6 pg (ref 26.0–34.0)
MCHC: 29.5 g/dL — ABNORMAL LOW (ref 30.0–36.0)
MCV: 107.4 fL — AB (ref 78.0–100.0)
Platelets: 47 10*3/uL — ABNORMAL LOW (ref 150–400)
RBC: 2.72 MIL/uL — AB (ref 4.22–5.81)
RDW: 24.3 % — AB (ref 11.5–15.5)
WBC: 203.9 10*3/uL — AB (ref 4.0–10.5)

## 2017-10-07 MED ORDER — VANCOMYCIN HCL 500 MG IV SOLR
500.0000 mg | INTRAVENOUS | Status: DC
  Filled 2017-10-07: qty 500

## 2017-10-07 MED ORDER — SODIUM CHLORIDE 0.9 % IV SOLN
1.5000 g | Freq: Three times a day (TID) | INTRAVENOUS | Status: DC
  Administered 2017-10-07 – 2017-10-08 (×3): 1.5 g via INTRAVENOUS
  Filled 2017-10-07 (×7): qty 1.5

## 2017-10-07 NOTE — Progress Notes (Addendum)
Pharmacy Antibiotic Note  Randall Reyes is a 82 y.o. male admitted on 10/04/2017 with pneumonia and cellulitis.  Pharmacy has been consulted for Vancomycin and Cefepime dosing.  Improving renal function noted.  Plan: Change to Unasyn 1.5GM IV every 8 hours. Monitor labs, micro and vitals  Height: 5\' 1"  (154.9 cm) Weight: 124 lb 1.9 oz (56.3 kg) IBW/kg (Calculated) : 52.3  Temp (24hrs), Avg:98.1 F (36.7 C), Min:97.8 F (36.6 C), Max:98.5 F (36.9 C)  Recent Labs  Lab 10/04/17 1500 10/04/17 1947 10/05/17 0403 10/06/17 0451 10/07/17 0647  WBC 252.1*  --  216.0* 200.1* 203.9*  CREATININE 2.16*  --  1.70* 1.43* 1.36*  LATICACIDVEN  --  0.8  --   --   --     Estimated Creatinine Clearance: 32 mL/min (A) (by C-G formula based on SCr of 1.36 mg/dL (H)).    Allergies  Allergen Reactions  . Aspirin Other (See Comments)    Bleeding ulcers.   . Ibuprofen Other (See Comments)    Bleeding ulcers.   Antimicrobials this admission:  Vanc 5/24 >> 5/27 Cefepime 5/24 >> 5/27 Unasyn 5/27 >>  Valtrex stopped    Dose adjustments this admission:  n/a    Microbiology results:  5/25 Wound: pending 5/24: Bcx2: pending  UCx:    Sputum:    5/24: MRSA PCR:  negative  Thank you for allowing pharmacy to be a part of this patient's care.  Pricilla Larsson 10/07/2017 9:40 AM

## 2017-10-07 NOTE — Progress Notes (Signed)
PROGRESS NOTE                                                                                                                                                                                                             Patient Demographics:    Randall Reyes, is a 82 y.o. male, DOB - November 19, 1935, SEG:[id number]  Admit date - 10/04/2017   Admitting Physician Pratik Darleen Crocker, DO  Outpatient Primary MD for the patient is [name], MD  LOS - 3  Outpatient Specialists:[facility] ( oncology)  Chief Complaint  Patient presents with  . Weakness       Brief Narrative   82 year old male with CLL not on treatment, a flutter, CKD stage III, heart block status post dual-chamber pacemaker, diabetes mellitus, dyslipidemia and recent facial cellulitis who was being treated as outpatient (both by PCP followed by ENT) with antibiotic for the past 2 weeks presented to the ED with increasing weakness, poor p.o. intake for past few days.  His blood pressure was also low in the 70s to 80s.  Patient also reported mild productive cough for the same duration. In the ED he was septic with elevated leukocytosis of 250,000, baseline of 100 K), anemia (drop in hemoglobin to 6.8 from 10 of baseline) and thrombocytopenia (60 9K from baseline of 160 K).  Renal function were at baseline.  Chest x-ray and CT of the chest both showed left upper lobe pneumonia.  Patient ordered for 2 unit PRBC and started on empiric vancomycin and cefepime.    Subjective:   Much better.  No overnight events.   Assessment  & Plan :    Principal Problem: Systemic inflammatory response syndrome (HCC) Secondary to lobar pneumonia and left facial cellulitis with abscess.  Switch antibiotic to Unasyn.  Symptoms much better. Cultures negative.  Right lobar pneumonia ([place]) Switch antibiotic to Unasyn.  Left facial cellulitis with abscess. Improving with empiric  antibiotics.  Abscess cavity freely draining with improved swelling and tenderness.  Surgery consult appreciated.  Continue topical Bactroban.     CLL (chronic lymphocytic leukemia) (HCC) Worsened leukocytosis possibly due to sepsis.  Slowly improving.  Hospitalist and ED physician discussed with oncologist on admission.    Anemia and thrombocytopenia Secondary to sepsis.  Received 2 unit PRBC on admission and hemoglobin currently stable.  Platelet low but  stable.     CKD (chronic kidney disease), stage III ([place]) Renal function better from baseline.  Avoiding nephrotoxins.  Hyperkalemia Mild.  Resolved without intervention.    Generalized weakness and failure to thrive. PT evaluation.  Continue supplement.  Diabetes mellitus type 2 CBG stable Monitor on sliding scale coverage.  History of a flutter and heart block Has dual-chamber pacemaker.  Not on anticoagulation due to thrombus cytopenia  Code Status : Full code  Family Communication  : Wife at bedside  Disposition Plan  : Home possibly tomorrow if continues to improve.  Discontinue telemetry.  Barriers For Discharge : Active symptoms  Consults  : Surgery, discussed with oncologist on the phone  Procedures  : CT chest without contrast  DVT Prophylaxis  : SCDs  Lab Results  Component Value Date   PLT 47 (L) 10/07/2017    Antibiotics  :    Anti-infectives (From admission, onward)   Start     Dose/Rate Route Frequency Ordered Stop   10/07/17 2200  vancomycin (VANCOCIN) 500 mg in sodium chloride 0.9 % 100 mL IVPB     500 mg 100 mL/hr over 60 Minutes Intravenous Every 24 hours 10/07/17 0943     10/06/17 2000  vancomycin (VANCOCIN) IVPB 750 mg/150 ml premix  Status:  Discontinued     750 mg 150 mL/hr over 60 Minutes Intravenous Every 48 hours 10/04/17 2130 10/05/17 0840   10/06/17 2000  vancomycin (VANCOCIN) IVPB 750 mg/150 ml premix  Status:  Discontinued     750 mg 150 mL/hr over 60 Minutes Intravenous Every  48 hours 10/05/17 0842 10/07/17 0943   10/05/17 1800  ceFEPIme (MAXIPIME) 1 g in sodium chloride 0.9 % 100 mL IVPB     1 g 200 mL/hr over 30 Minutes Intravenous Every 24 hours 10/04/17 2130     10/04/17 2200  valACYclovir (VALTREX) tablet 500 mg  Status:  Discontinued     500 mg Oral 2 times daily 10/04/17 2026 10/07/17 1023   10/04/17 1945  vancomycin (VANCOCIN) IVPB 1000 mg/200 mL premix  Status:  Discontinued     1,000 mg 200 mL/hr over 60 Minutes Intravenous  Once 10/04/17 1937 10/04/17 2110   10/04/17 1930  ceFEPIme (MAXIPIME) 1 g in sodium chloride 0.9 % 100 mL IVPB     1 g 200 mL/hr over 30 Minutes Intravenous  Once 10/04/17 1925 10/04/17 2021        Objective:   Vitals:   10/06/17 1122 10/06/17 1512 10/06/17 2058 10/07/17 0540  BP:  99/68 116/69 103/69  Pulse: 82 60 65 64  Resp: 19 18  15   Temp: 98.3 F (36.8 C) 97.9 F (36.6 C) 97.8 F (36.6 C) 98.5 F (36.9 C)  TempSrc: Oral Oral Oral Oral  SpO2: 95% 95% 98% 95%  Weight:      Height:        Wt Readings from Last 3 Encounters:  10/06/17 56.3 kg (124 lb 1.9 oz)  10/01/17 54.4 kg (120 lb)  09/11/17 56.7 kg (125 lb)     Intake/Output Summary (Last 24 hours) at 10/07/2017 1050 Last data filed at 10/07/2017 0900 Gross per 24 hour  Intake 1903.75 ml  Output 275 ml  Net 1628.75 ml   Physical exam Elderly male not in distress HEENT: Pallor present, moist mucosa, swelling over the left side of the face with purulent discharge (much improved and minimally tender) Chest: Clear to auscultation bilaterally CVS: Normal S1-S2, no murmurs GI: Soft, nondistended, nontender Musculoskeletal:  Warm, no edema      Data Review:    CBC Recent Labs  Lab 10/04/17 1500 10/05/17 0403 10/06/17 0451 10/07/17 0647  WBC 252.1* 216.0* 200.1* 203.9*  HGB 6.8* 8.7* 8.9* 8.6*  HCT 21.0* 26.4* 29.2* 29.2*  PLT 69* 53* 57* 47*  MCV 116.0* 99.6 105.4* 107.4*  MCH 37.6* 32.8 32.1 31.6  MCHC 32.4 33.0 30.5 29.5*  RDW 18.4*  24.7* 25.1* 24.3*  LYMPHSABS 237.0*  --   --   --   MONOABS 0.0*  --   --   --   EOSABS 0.0  --   --   --   BASOSABS 0.0  --   --   --     Chemistries  Recent Labs  Lab 10/04/17 1500 10/05/17 0403 10/06/17 0451 10/07/17 0647  NA 133* 138 137 135  K 4.6 4.9 5.2* 4.8  CL 98* 106 107 107  CO2 25 22 23 24   GLUCOSE 189* 119* 134* 115*  BUN 32* 26* 25* 20  CREATININE 2.16* 1.70* 1.43* 1.36*  CALCIUM 8.5* 8.0* 8.1* 7.8*  AST 25  --   --   --   ALT 11*  --   --   --   ALKPHOS 117  --   --   --   BILITOT 0.6  --   --   --    ------------------------------------------------------------------------------------------------------------------ No results for input(s): CHOL, HDL, LDLCALC, TRIG, CHOLHDL, LDLDIRECT in the last 72 hours.  Lab Results  Component Value Date   HGBA1C 6.5 (H) 05/24/2016   ------------------------------------------------------------------------------------------------------------------ No results for input(s): TSH, T4TOTAL, T3FREE, THYROIDAB in the last 72 hours.  Invalid input(s): FREET3 ------------------------------------------------------------------------------------------------------------------ No results for input(s): VITAMINB12, FOLATE, FERRITIN, TIBC, IRON, RETICCTPCT in the last 72 hours.  Coagulation profile No results for input(s): INR, PROTIME in the last 168 hours.  No results for input(s): DDIMER in the last 72 hours.  Cardiac Enzymes No results for input(s): CKMB, TROPONINI, MYOGLOBIN in the last 168 hours.  Invalid input(s): CK ------------------------------------------------------------------------------------------------------------------ No results found for: BNP  Inpatient Medications  Scheduled Meds: . cholecalciferol  400 Units Oral Daily  . feeding supplement (ENSURE ENLIVE)  237 mL Oral BID BM  . insulin aspart  0-5 Units Subcutaneous QHS  . insulin aspart  0-9 Units Subcutaneous TID WC  . levETIRAcetam  750 mg Oral BID    . linagliptin  5 mg Oral Daily  . mupirocin ointment   Topical BID  . rosuvastatin  40 mg Oral QHS  . sevelamer carbonate  800 mg Oral TID WC  . sodium bicarbonate  650 mg Oral BID  . vitamin C  250 mg Oral Daily   Continuous Infusions: . sodium chloride 75 mL/hr at 10/07/17 0936  . ceFEPime (MAXIPIME) IV Stopped (10/06/17 1830)  . vancomycin     PRN Meds:.acetaminophen **OR** acetaminophen, docusate sodium, HYDROcodone-acetaminophen, magic mouthwash **AND** lidocaine, ondansetron **OR** ondansetron (ZOFRAN) IV  Micro Results Recent Results (from the past 240 hour(s))  Culture, blood (Routine X 2) w Reflex to ID Panel     Status: None (Preliminary result)   Collection Time: 10/04/17  7:47 PM  Result Value Ref Range Status   Specimen Description LEFT ANTECUBITAL  Final   Special Requests   Final    BOTTLES DRAWN AEROBIC AND ANAEROBIC Blood Culture adequate volume   Culture   Final    NO GROWTH 3 DAYS Performed at [facility], [address], [place], [place] [address]    Report  Status PENDING  Incomplete  Culture, blood (Routine X 2) w Reflex to ID Panel     Status: None (Preliminary result)   Collection Time: 10/04/17  7:48 PM  Result Value Ref Range Status   Specimen Description BLOOD RIGHT HAND  Final   Special Requests   Final    BOTTLES DRAWN AEROBIC AND ANAEROBIC Blood Culture adequate volume   Culture   Final    NO GROWTH 3 DAYS Performed at [facility], [address] [place], [place] [address]    Report Status PENDING  Incomplete  MRSA PCR Screening     Status: None   Collection Time: 10/04/17  9:46 PM  Result Value Ref Range Status   MRSA by PCR NEGATIVE NEGATIVE Final    Comment:        The GeneXpert MRSA Assay (FDA approved for NASAL specimens only), is one component of a comprehensive MRSA colonization surveillance program. It is not intended to diagnose MRSA infection nor to guide or monitor treatment for MRSA infections. Performed at [facility], [address], [place], [place] [address]   Aerobic Culture (superficial specimen)     Status: None (Preliminary result)   Collection Time: 10/05/17 10:45 AM  Result Value Ref Range Status   Specimen Description   Final    FACE LEFT CHEEK Performed at [facility], [address], [place], [place] [address]    Special Requests   Final    Normal Performed at [facility], [address], [place], [place] [address]    Gram Stain   Final    RARE WBC PRESENT, PREDOMINANTLY PMN NO ORGANISMS SEEN    Culture   Final    TOO YOUNG TO READ Performed at [facility] [address] [place], [place] [address]    Report Status PENDING  Incomplete    Radiology Reports Dg Chest 2 View  Result Date: 10/04/2017 CLINICAL DATA:  Weakness EXAM: CHEST - 2 VIEW COMPARISON:  02/08/2017 chest radiograph. FINDINGS: Stable configuration of 2 lead left subclavian pacemaker. Right subclavian MediPort terminates in the lower third of the SVC. Stable cardiomediastinal silhouette with top-normal heart size. No pneumothorax. No pleural effusion. New patchy opacity in the posterior upper left lung. IMPRESSION: New patchy opacity in the posterior upper left lung, suggestive of a pneumonia. Recommend follow-up PA and lateral post treatment chest radiographs in 4-6 weeks. Electronically Signed   By: Ilona Sorrel M.D.   On: 10/04/2017 16:45   Ct Chest Wo Contrast  Result Date: 10/04/2017 CLINICAL DATA:  Hypotension and weakness. Dyspnea. History of chronic lymphocytic leukemia. EXAM: CT CHEST WITHOUT CONTRAST TECHNIQUE: Multidetector CT imaging of the chest was performed following the standard protocol without IV contrast. COMPARISON:  Same day CXR and CXR from 02/08/2017, CT abdomen from 12/16/2015 FINDINGS: Cardiovascular: Mild aortic atherosclerosis without aneurysm. Conventional branch pattern of the great vessels. Cardiomegaly with coronary arteriosclerosis. Left-sided pacemaker apparatus with right atrial  and right ventricular leads. Port catheter tip terminates in the distal SVC. Mediastinum/Nodes: Mediastinal and hilar lymphadenopathy are identified measuring up to 16 mm short axis along the right lower paratracheal portion of the mediastinum with a 14 mm AP window enlarged lymph node. Additional enlarged mediastinal lymph nodes are present, compatible with history of CLL. Patent trachea and mainstem bronchi. Esophagus is unremarkable. Small hiatal hernia is noted. Lungs/Pleura: As suggested on chest radiograph, confluent pulmonary opacity in the posterior segment of left upper lobe suspicious for pneumonia is noted. This can mask  underlying pulmonary lesions. Follow-up after appropriate antibiotic therapy is recommended to exclude neoplasm. No effusion or pneumothorax. Upper Abdomen: Portacaval, retroperitoneal and gastroduodenal adenopathy measuring up to 16 mm in the porta hepatis are identified. No splenomegaly. Musculoskeletal: Chronic lower thoracic compression fractures are noted. No aggressive nor acute osseous appearing abnormality. IMPRESSION: 1. New confluent left upper lobe pulmonary consolidations suspicious for pneumonia. Followup PA and lateral chest X-ray is recommended in 3-4 weeks following trial of antibiotic therapy to ensure resolution and exclude underlying malignancy. 2. Mediastinal and abdominal adenopathy compatible with history of CLL. 3. Cardiomegaly with coronary arteriosclerosis. Atherosclerotic nonaneurysmal aorta. Aortic Atherosclerosis (ICD10-I70.0). Electronically Signed   By: Ashley Royalty M.D.   On: 10/04/2017 19:04    Time Spent in minutes 25   Sunny Aguon M.D on 10/07/2017 at 10:50 AM  Between 7am to 7pm - Pager - [phone]  After 7pm go to [email or web address] - password [facility]  [facility] -  Office  [phone]

## 2017-10-07 NOTE — Care Management Important Message (Signed)
Important Message  Patient Details  Name: Randall Reyes MRN: [id number] Date of Birth: 11/17/1935   Medicare Important Message Given:  Yes    [name] 10/07/2017, 11:15 AM

## 2017-10-07 NOTE — Evaluation (Signed)
Physical Therapy Evaluation Patient Details Name: Randall Reyes MRN: [id number] DOB: 1935-12-26 Today's Date: 10/07/2017   History of Present Illness  82 year old male with CLL not on treatment, a flutter, CKD stage III, heart block status post dual-chamber pacemaker, diabetes mellitus, dyslipidemia and recent facial cellulitis who was being treated as outpatient (both by PCP followed by ENT) with antibiotic for the past 2 weeks presented to the ED with increasing weakness, poor p.o. intake for past few days.  His blood pressure was also low in the 70s to 80s.  Patient also reported mild productive cough for the same duration. In the ED he was septic with elevated leukocytosis of 250,000, baseline of 100 K), anemia (drop in hemoglobin to 6.8 from 10 of baseline) and thrombocytopenia (60 9K from baseline of 160 K).  Renal function were at baseline.  Chest x-ray and CT of the chest both showed left upper lobe pneumonia.  Patient ordered for 2 unit PRBC and started on empiric vancomycin and cefepime.    Clinical Impression  Randall Reyes is a 82 y.o. presenting for PT evaluation with recent decrease in functional mobility secondary to above history. Upon arrival patient was in bathroom alone and reported he had ambulated from recliner next to hospital room window to the bathroom while pushing his IV pole. He was educated on safety concerns and need to call for assistance to mobilize. He is currently functioning close to his baseline of independent with no device, and now requires supervision for transfers/gait with no device. His bil LE strength is WFL's and he demonstrated SLS balance of 5 seconds on bil LE's. He was educated on exercises for LE strengthening and recliner was positioned by room door to improve line of sight for RN. Chair alarm was set up for safety and patient was reminded of importance to call for assistance if he wants to get up. He has no further needs for acute PT services and  will benefit from follow up PT at below venue to address mobility and endurance to improve home/community ambulaiton.Anticipate he will be ready for discharge home when medically ready. Discharging to RN staff for mobility needs, re-consult Acute PT if change in functional status.     Follow Up Recommendations Home health PT    Equipment Recommendations  None recommended by PT    Recommendations for Other Services       Precautions / Restrictions Precautions Precautions: Fall Restrictions Weight Bearing Restrictions: No      Mobility  Bed Mobility Overal bed mobility: Modified Independent     Transfers Overall transfer level: Needs assistance   Transfers: Sit to/from Stand Sit to Stand: Supervision      General transfer comment: 5 x sit to stand: 16 seconds  Ambulation/Gait Ambulation/Gait assistance: Supervision Ambulation Distance (Feet): 50 Feet Assistive device: None Gait Pattern/deviations: Decreased step length - right;Decreased step length - left;Decreased stride length;Step-through pattern     General Gait Details: slow gait, close to reported baseline      Balance Overall balance assessment: Modified Independent Sitting-balance support: No upper extremity supported;Feet unsupported Sitting balance-Leahy Scale: Normal     Standing balance support: No upper extremity supported;During functional activity Standing balance-Leahy Scale: Good   Single Leg Stance - Right Leg: 5 Single Leg Stance - Left Leg: 5         High level balance activites: Direction changes;Turns;Head turns High Level Balance Comments: patient maintained good standign balance during dynamic turns and acivity, demonstrated safe problem solving for  management of IV pole         Pertinent Vitals/Pain Pain Assessment: No/denies pain    Home Living Family/patient expects to be discharged to:: Private residence Living Arrangements: Spouse/significant other(son lives close  by) Available Help at Discharge: Family;Available 24 hours/day Type of Home: House Home Access: Level entry     Home Layout: One level Home Equipment: Cane - single point      Prior Function Level of Independence: Independent         Hand Dominance   Dominant Hand: Right    Extremity/Trunk Assessment   Upper Extremity Assessment Upper Extremity Assessment: Overall WFL for tasks assessed    Lower Extremity Assessment Lower Extremity Assessment: Overall WFL for tasks assessed       Communication   Communication: HOH  Cognition Arousal/Alertness: Awake/alert Behavior During Therapy: WFL for tasks assessed/performed Overall Cognitive Status: Within Functional Limits for tasks assessed          Exercises General Exercises - Lower Extremity Ankle Circles/Pumps: Both;10 reps;Seated;AROM Long [employer]: Both;10 reps;Seated;AROM Hip ABduction/ADduction: AROM;Both;10 reps;Seated   Assessment/Plan    PT Assessment Patent does not need any further PT services  PT Problem List         PT Treatment Interventions      PT Goals (Current goals can be found in the Care Plan section)  Acute Rehab PT Goals Patient Stated Goal: go back home and get stronger PT Goal Formulation: With patient Time For Goal Achievement: 10/14/17 Potential to Achieve Goals: Good    Frequency  1 time visit    AM-PAC PT "6 Clicks" Daily Activity  Outcome Measure Difficulty turning over in bed (including adjusting bedclothes, sheets and blankets)?: None Difficulty moving from lying on back to sitting on the side of the bed? : None Difficulty sitting down on and standing up from a chair with arms (e.g., wheelchair, bedside commode, etc,.)?: None Help needed moving to and from a bed to chair (including a wheelchair)?: A Little Help needed walking in hospital room?: A Little Help needed climbing 3-5 steps with a railing? : A Little 6 Click Score: 21    End of Session Equipment Utilized  During Treatment: Gait belt Activity Tolerance: Patient tolerated treatment well Patient left: in bed;with call bell/phone within reach;in chair;with chair alarm set Nurse Communication: Mobility status PT Visit Diagnosis: Unsteadiness on feet (R26.81);Difficulty in walking, not elsewhere classified (R26.2);Muscle weakness (generalized) (M62.81);Other abnormalities of gait and mobility (R26.89)    Time: 4076-8088 PT Time Calculation (min) (ACUTE ONLY): 18 min   Charges:   PT Evaluation $PT Eval Low Complexity: 1 Low     PT G Codes:        Kipp Brood, PT, DPT Physical Therapist with [facility]  10/07/2017 1:25 PM

## 2017-10-07 NOTE — Plan of Care (Signed)
  Problem: Acute Rehab PT Goals(only PT should resolve) Goal: Patient Will Transfer Sit To/From Stand Outcome: Completed/Met Flowsheets (Taken 10/07/2017 1322) Patient will transfer sit to/from stand: with modified independence Goal: Pt Will Transfer Bed To Chair/Chair To Bed Outcome: Completed/Met Flowsheets (Taken 10/07/2017 1322) Pt will Transfer Bed to Chair/Chair to Bed: with supervision Goal: Pt Will Ambulate Outcome: Completed/Met Flowsheets (Taken 10/07/2017 1322) Pt will Ambulate: 50 feet;with least restrictive assistive device;with supervision

## 2017-10-07 NOTE — Progress Notes (Signed)
  Subjective: Facial abscess much improved.  Drainage has decreased.  Objective: Vital signs in last 24 hours: Temp:  [97.8 F (36.6 C)-98.5 F (36.9 C)] 98.5 F (36.9 C) (05/27 0540) Pulse Rate:  [60-82] 64 (05/27 0540) Resp:  [15-19] 15 (05/27 0540) BP: (99-116)/(68-69) 103/69 (05/27 0540) SpO2:  [95 %-98 %] 95 % (05/27 0540) Last BM Date: 10/03/17  Intake/Output from previous day: 05/26 0701 - 05/27 0700 In: 3028.8 [P.O.:580; I.V.:2198.8; IV Piggyback:250] Out: 650 [Urine:650] Intake/Output this shift: Total I/O In: 240 [P.O.:240] Out: -   General appearance: alert, cooperative and no distress Head: Less drainage from abscess.  Mild erythema noted.  Lab Results:  Recent Labs    10/06/17 0451 10/07/17 0647  WBC 200.1* 203.9*  HGB 8.9* 8.6*  HCT 29.2* 29.2*  PLT 57* 47*   BMET Recent Labs    10/06/17 0451 10/07/17 0647  NA 137 135  K 5.2* 4.8  CL 107 107  CO2 23 24  GLUCOSE 134* 115*  BUN 25* 20  CREATININE 1.43* 1.36*  CALCIUM 8.1* 7.8*   PT/INR No results for input(s): LABPROT, INR in the last 72 hours.  Studies/Results: No results found.  Anti-infectives: Anti-infectives (From admission, onward)   Start     Dose/Rate Route Frequency Ordered Stop   10/07/17 2200  vancomycin (VANCOCIN) 500 mg in sodium chloride 0.9 % 100 mL IVPB     500 mg 100 mL/hr over 60 Minutes Intravenous Every 24 hours 10/07/17 0943     10/06/17 2000  vancomycin (VANCOCIN) IVPB 750 mg/150 ml premix  Status:  Discontinued     750 mg 150 mL/hr over 60 Minutes Intravenous Every 48 hours 10/04/17 2130 10/05/17 0840   10/06/17 2000  vancomycin (VANCOCIN) IVPB 750 mg/150 ml premix  Status:  Discontinued     750 mg 150 mL/hr over 60 Minutes Intravenous Every 48 hours 10/05/17 0842 10/07/17 0943   10/05/17 1800  ceFEPIme (MAXIPIME) 1 g in sodium chloride 0.9 % 100 mL IVPB     1 g 200 mL/hr over 30 Minutes Intravenous Every 24 hours 10/04/17 2130     10/04/17 2200  valACYclovir  (VALTREX) tablet 500 mg  Status:  Discontinued     500 mg Oral 2 times daily 10/04/17 2026 10/07/17 1023   10/04/17 1945  vancomycin (VANCOCIN) IVPB 1000 mg/200 mL premix  Status:  Discontinued     1,000 mg 200 mL/hr over 60 Minutes Intravenous  Once 10/04/17 1937 10/04/17 2110   10/04/17 1930  ceFEPIme (MAXIPIME) 1 g in sodium chloride 0.9 % 100 mL IVPB     1 g 200 mL/hr over 30 Minutes Intravenous  Once 10/04/17 1925 10/04/17 2021      Assessment/Plan: Impression: Facial abscess, resolving Plan: Would continue an antibiotic for staph coverage of abscess for 1 week as an outpatient.  May continue Bactroban cream to the abscess.  Patient to follow-up with ear nose and throat doctor.  Will sign off.  LOS: 3 days    Aviva Signs 10/07/2017

## 2017-10-08 DIAGNOSIS — A419 Sepsis, unspecified organism: Secondary | ICD-10-CM | POA: Diagnosis present

## 2017-10-08 LAB — GLUCOSE, CAPILLARY
GLUCOSE-CAPILLARY: 99 mg/dL (ref 65–99)
Glucose-Capillary: 118 mg/dL — ABNORMAL HIGH (ref 65–99)

## 2017-10-08 LAB — CBC
HCT: 27.8 % — ABNORMAL LOW (ref 39.0–52.0)
HEMOGLOBIN: 8.1 g/dL — AB (ref 13.0–17.0)
MCH: 31.6 pg (ref 26.0–34.0)
MCHC: 29.1 g/dL — ABNORMAL LOW (ref 30.0–36.0)
MCV: 108.6 fL — AB (ref 78.0–100.0)
Platelets: 49 10*3/uL — ABNORMAL LOW (ref 150–400)
RBC: 2.56 MIL/uL — AB (ref 4.22–5.81)
RDW: 23.4 % — ABNORMAL HIGH (ref 11.5–15.5)
WBC: 202.9 10*3/uL — AB (ref 4.0–10.5)

## 2017-10-08 LAB — AEROBIC CULTURE W GRAM STAIN (SUPERFICIAL SPECIMEN)

## 2017-10-08 LAB — AEROBIC CULTURE  (SUPERFICIAL SPECIMEN): SPECIAL REQUESTS: NORMAL

## 2017-10-08 MED ORDER — DOXYCYCLINE HYCLATE 100 MG PO TABS: 100.0000 mg | ORAL_TABLET | Freq: Two times a day (BID) | ORAL | 0 refills | Status: DC

## 2017-10-08 MED ORDER — ENSURE ENLIVE PO LIQD: 237.0000 mL | Freq: Two times a day (BID) | ORAL | 12 refills | Status: AC

## 2017-10-08 MED ORDER — DOXYCYCLINE HYCLATE 100 MG PO TABS
100.0000 mg | ORAL_TABLET | Freq: Two times a day (BID) | ORAL | Status: DC
  Administered 2017-10-08: 100 mg via ORAL
  Filled 2017-10-08: qty 1

## 2017-10-08 MED ORDER — MUPIROCIN 2 % EX OINT: TOPICAL_OINTMENT | Freq: Two times a day (BID) | CUTANEOUS | 0 refills | Status: AC

## 2017-10-08 NOTE — Care Management Note (Signed)
Case Management Note  Patient Details  Name: Randall Reyes MRN: [id number] Date of Birth: 26-Jun-1935  Subjective/Objective:  Adm with pneumonia. From home with wife. Ind with ADL's. Has cane, RW, and rollator if needed. Recommended for HH PT. Agreeable. No preference on home health agencies.                   Action/Plan: Juliann Pulse [facility] notified and will obtain orders via Epic when available.   Expected Discharge Date:   10/08/2017               Expected Discharge Plan:  [place]  In-House Referral:     Discharge planning Services  CM Consult  Post Acute Care Choice:  Home Health, NA Choice offered to:  Spouse, Patient  DME Arranged:    DME Agency:     HH Arranged:  PT [place]:  [place]  Status of Service:  Completed, signed off  If discussed at [place] of Stay Meetings, dates discussed:    Additional Comments:  Yousra Ivens, Chauncey Reading, RN 10/08/2017, 9:03 AM

## 2017-10-08 NOTE — Progress Notes (Signed)
Pharmacy Antibiotic Note  Randall Reyes is a 82 y.o. male admitted on 10/04/2017 with pneumonia and cellulitis.  Pharmacy has been consulted for Vancomycin and Cefepime dosing.  Improving renal function noted.  Plan: Change to Doxycyline PO x 7days. Discussed with MD during progression rounds. Sign off  Height: 5\' 1"  (154.9 cm) Weight: 124 lb 1.9 oz (56.3 kg) IBW/kg (Calculated) : 52.3  Temp (24hrs), Avg:99.1 F (37.3 C), Min:98.6 F (37 C), Max:99.9 F (37.7 C)  Recent Labs  Lab 10/04/17 1500 10/04/17 1947 10/05/17 0403 10/06/17 0451 10/07/17 0647 10/08/17 0409  WBC 252.1*  --  216.0* 200.1* 203.9* 202.9*  CREATININE 2.16*  --  1.70* 1.43* 1.36*  --   LATICACIDVEN  --  0.8  --   --   --   --     Estimated Creatinine Clearance: 32 mL/min (A) (by C-G formula based on SCr of 1.36 mg/dL (H)).    Allergies  Allergen Reactions  . Aspirin Other (See Comments)    Bleeding ulcers.   . Ibuprofen Other (See Comments)    Bleeding ulcers.   Antimicrobials this admission:  Vanc 5/24 >> 5/27 Cefepime 5/24 >> 5/27 Unasyn 5/27 >>  Valtrex stopped    Dose adjustments this admission:  n/a    Microbiology results:  5/25 Wound: pending 5/24: Bcx2: pending  UCx:    Sputum:    5/24: MRSA PCR:  negative  Thank you for allowing pharmacy to be a part of this patient's care.  Pricilla Larsson 10/08/2017 10:12 AM

## 2017-10-08 NOTE — Discharge Summary (Signed)
Physician Discharge Summary  Randall Reyes VEL:[id number] DOB: 10/19/1935 DOA: 10/04/2017  PCP: [name], MD  Admit date: 10/04/2017 Discharge date: 10/08/2017  Admitted From: Home Disposition: Home  Recommendations for Outpatient Follow-up:  1. Patient will complete about 12 days of antibiotics on 6/4. 2. Follow-up with PCP in 1 week.  Please check CBC. 3. Follow-up with oncologist and ENT in 2 weeks.  Home Health: PT Equipment/Devices: Rolling walker  Discharge Condition: Fair CODE STATUS: Full code Diet recommendation: Heart Healthy with supplements    Discharge Diagnoses:   Principal problem Systemic inflammatory response syndrome (SIRS)  Active Problems:   Facial abscess   CLL (chronic lymphocytic leukemia) (HCC)   HCAP (healthcare-associated pneumonia)   Anemia   Typical atrial flutter (HCC)   Pacemaker   CKD (chronic kidney disease), stage III (HCC)   Thrombocytopenia (HCC)  Brief narrative/HPI 82 year old male with CLL not on treatment, a flutter, CKD stage III, heart block status post dual-chamber pacemaker, diabetes mellitus, dyslipidemia and recent facial cellulitis who was being treated as outpatient (both by PCP followed by ENT) with antibiotic for the past 2 weeks presented to the ED with increasing weakness, poor p.o. intake for past few days.  His blood pressure was also low in the 70s to 80s.  Patient also reported mild productive cough for the same duration. In the ED he was septic with elevated leukocytosis of 250,000, baseline of 100 K), anemia (drop in hemoglobin to 6.8 from 10 of baseline) and thrombocytopenia (60 9K from baseline of 160 K).  Renal function were at baseline.  Chest x-ray and CT of the chest both showed left upper lobe pneumonia.  Patient ordered for 2 unit PRBC and started on empiric vancomycin and cefepime.   Hospital course Principal Problem: Systemic inflammatory response syndrome ([place]) Secondary to lobar pneumonia and  left facial cellulitis with abscess.   Was on empiric vancomycin and cefepime, switched to Unasyn.  Symptoms improved.  Blood pressure stable.  Right lobar pneumonia ([place]) Switched antibiotics to Unasyn.  He will be discharged on doxycycline.  Left facial cellulitis with abscess. Improving with empiric antibiotics.  Abscess cavity freely draining with improved swelling and tenderness.  Surgery consult appreciated.  Continue topical Bactroban, warm compress.  Wound culture growing staph aureus (sensitivity pending).  Will be discharged on doxycycline for 7 more days.  He should follow-up with ENT in 1-2 weeks.     CLL (chronic lymphocytic leukemia) (HCC) Worsened leukocytosis possibly due to sepsis.  Slowly improving.  Hospitalist and ED physician discussed with oncologist on admission.    Recommended to manage sepsis.  Anemia and thrombocytopenia Secondary to sepsis.  Received 2 unit PRBC on admission and hemoglobin currently stable.  Platelet low but stable.  And on and also received Bactrim prior to admission which could have contributed to thrombocytopenia.  Should follow-up with oncologist in 1-2 weeks.     CKD (chronic kidney disease), stage III ([place]) Renal function better from baseline.  Avoiding nephrotoxins.  Hyperkalemia Mild.  Resolved without intervention.    Generalized weakness and failure to thrive. In by PT and recommends home health.  Added nutrition supplement.  Diabetes mellitus type 2 CBG stable   History of a flutter and heart block Has dual-chamber pacemaker.  Not on anticoagulation due to thrombocytopenia    Family Communication  : Wife at bedside  Disposition Plan  : Home     Consults  : Surgery, discussed with oncologist on the phone  Procedures  :  CT chest without contrast      Discharge Instructions   Allergies as of 10/08/2017      Reactions   Aspirin Other (See Comments)   Bleeding ulcers.    Ibuprofen Other (See  Comments)   Bleeding ulcers.      Medication List    STOP taking these medications   clindamycin 300 MG capsule Commonly known as:  CLEOCIN   sulfamethoxazole-trimethoprim 800-160 MG tablet Commonly known as:  BACTRIM DS,SEPTRA DS   valACYclovir 500 MG tablet Commonly known as:  VALTREX     TAKE these medications   ACCU-CHEK AVIVA PLUS test strip Generic drug:  glucose blood   acetaminophen 500 MG tablet Commonly known as:  TYLENOL Take 500 mg by mouth at bedtime as needed for mild pain or moderate pain.   cholecalciferol 400 units Tabs tablet Commonly known as:  VITAMIN D Take 400 Units by mouth daily.   doxycycline 100 MG tablet Commonly known as:  VIBRA-TABS Take 1 tablet (100 mg total) by mouth every 12 (twelve) hours.   feeding supplement (ENSURE ENLIVE) Liqd Take 237 mLs by mouth 2 (two) times daily between meals.   levETIRAcetam 100 MG/ML solution Commonly known as:  KEPPRA Take 7.5 mLs (750 mg total) by mouth 2 (two) times daily.   lidocaine-prilocaine cream Commonly known as:  EMLA Apply a quarter size amount to affected area 1 hour prior to coming to chemotherapy.  Do not rub in.  Cover with plastic wrap.   magic mouthwash w/lidocaine Soln Take 5 mLs by mouth 4 (four) times daily as needed for mouth pain.   mupirocin ointment 2 % Commonly known as:  BACTROBAN Apply topically 2 (two) times daily for 7 days.   ondansetron 4 MG disintegrating tablet Commonly known as:  ZOFRAN-ODT Take 4 mg by mouth 4 (four) times daily as needed.   rosuvastatin 40 MG tablet Commonly known as:  CRESTOR Take 40 mg by mouth at bedtime.   sevelamer carbonate 800 MG tablet Commonly known as:  RENVELA Take 2 tablets (1,600 mg total) by mouth 3 (three) times daily with meals. What changed:  how much to take   sitaGLIPtin 100 MG tablet Commonly known as:  JANUVIA Take 100 mg by mouth daily.   sodium bicarbonate 650 MG tablet TAKE 1 TABLET BY MOUTH TWICE A DAY AS  DIRECTED   STOOL SOFTENER PO Take 1 capsule by mouth as needed.   VITAMIN C PO Take 1 tablet by mouth daily.      Follow-up Information    [name], MD. Schedule an appointment as soon as possible for a visit in 1 week(s).   Specialty:  Internal Medicine Contact information: [address] [place] [facility] [address] [phone]        Derek Jack, MD. Schedule an appointment as soon as possible for a visit in 2 week(s).   Specialty:  Hematology Contact information: [place] [address] [phone]          Allergies  Allergen Reactions  . Aspirin Other (See Comments)    Bleeding ulcers.   . Ibuprofen Other (See Comments)    Bleeding ulcers.      Procedures/Studies: Dg Chest 2 View  Result Date: 10/04/2017 CLINICAL DATA:  Weakness EXAM: CHEST - 2 VIEW COMPARISON:  02/08/2017 chest radiograph. FINDINGS: Stable configuration of 2 lead left subclavian pacemaker. Right subclavian MediPort terminates in the lower third of the SVC. Stable cardiomediastinal silhouette with top-normal heart  size. No pneumothorax. No pleural effusion. New patchy opacity in the posterior upper left lung. IMPRESSION: New patchy opacity in the posterior upper left lung, suggestive of a pneumonia. Recommend follow-up PA and lateral post treatment chest radiographs in 4-6 weeks. Electronically Signed   By: Ilona Sorrel M.D.   On: 10/04/2017 16:45   Ct Chest Wo Contrast  Result Date: 10/04/2017 CLINICAL DATA:  Hypotension and weakness. Dyspnea. History of chronic lymphocytic leukemia. EXAM: CT CHEST WITHOUT CONTRAST TECHNIQUE: Multidetector CT imaging of the chest was performed following the standard protocol without IV contrast. COMPARISON:  Same day CXR and CXR from 02/08/2017, CT abdomen from 12/16/2015 FINDINGS: Cardiovascular: Mild aortic atherosclerosis without aneurysm. Conventional branch pattern of the great vessels. Cardiomegaly with coronary arteriosclerosis.  Left-sided pacemaker apparatus with right atrial and right ventricular leads. Port catheter tip terminates in the distal SVC. Mediastinum/Nodes: Mediastinal and hilar lymphadenopathy are identified measuring up to 16 mm short axis along the right lower paratracheal portion of the mediastinum with a 14 mm AP window enlarged lymph node. Additional enlarged mediastinal lymph nodes are present, compatible with history of CLL. Patent trachea and mainstem bronchi. Esophagus is unremarkable. Small hiatal hernia is noted. Lungs/Pleura: As suggested on chest radiograph, confluent pulmonary opacity in the posterior segment of left upper lobe suspicious for pneumonia is noted. This can mask underlying pulmonary lesions. Follow-up after appropriate antibiotic therapy is recommended to exclude neoplasm. No effusion or pneumothorax. Upper Abdomen: Portacaval, retroperitoneal and gastroduodenal adenopathy measuring up to 16 mm in the porta hepatis are identified. No splenomegaly. Musculoskeletal: Chronic lower thoracic compression fractures are noted. No aggressive nor acute osseous appearing abnormality. IMPRESSION: 1. New confluent left upper lobe pulmonary consolidations suspicious for pneumonia. Followup PA and lateral chest X-ray is recommended in 3-4 weeks following trial of antibiotic therapy to ensure resolution and exclude underlying malignancy. 2. Mediastinal and abdominal adenopathy compatible with history of CLL. 3. Cardiomegaly with coronary arteriosclerosis. Atherosclerotic nonaneurysmal aorta. Aortic Atherosclerosis (ICD10-I70.0). Electronically Signed   By: Ashley Royalty M.D.   On: 10/04/2017 19:04       Subjective: Reports feeling much better.  Denies further pain on his face.  Breathing better.  Discharge Exam: Vitals:   10/07/17 2201 10/08/17 0812  BP: (!) 106/55 104/63  Pulse: 80 67  Resp: 16 16  Temp: 99.9 F (37.7 C) 98.6 F (37 C)  SpO2: 95% 100%   Vitals:   10/07/17 0540 10/07/17 1528  10/07/17 2201 10/08/17 0812  BP: 103/69 (!) 96/57 (!) 106/55 104/63  Pulse: 64 65 80 67  Resp: 15 16 16 16   Temp: 98.5 F (36.9 C) 98.9 F (37.2 C) 99.9 F (37.7 C) 98.6 F (37 C)  TempSrc: Oral Oral Oral Oral  SpO2: 95% 97% 95% 100%  Weight:      Height:        General: Elderly male not in distress HEENT: Pallor present, moist mucosa, swelling over the left side of the face much improved with no purulent discharge today, nontender Chest: Clear bilaterally CVS: Normal S1-S2, no murmurs GI: Soft, nondistended, nontender Musculoskeletal : Warm, no edema   The results of significant diagnostics from this hospitalization (including imaging, microbiology, ancillary and laboratory) are listed below for reference.     Microbiology: Recent Results (from the past 240 hour(s))  Culture, blood (Routine X 2) w Reflex to ID Panel     Status: None (Preliminary result)   Collection Time: 10/04/17  7:47 PM  Result Value Ref Range Status  Specimen Description LEFT ANTECUBITAL  Final   Special Requests   Final    BOTTLES DRAWN AEROBIC AND ANAEROBIC Blood Culture adequate volume   Culture   Final    NO GROWTH 4 DAYS Performed at [facility], [address], [place], [place] [address]    Report Status PENDING  Incomplete  Culture, blood (Routine X 2) w Reflex to ID Panel     Status: None (Preliminary result)   Collection Time: 10/04/17  7:48 PM  Result Value Ref Range Status   Specimen Description BLOOD RIGHT HAND  Final   Special Requests   Final    BOTTLES DRAWN AEROBIC AND ANAEROBIC Blood Culture adequate volume   Culture   Final    NO GROWTH 4 DAYS Performed at [facility], [address], [place], [place] [address]    Report Status PENDING  Incomplete  MRSA PCR Screening     Status: None   Collection Time: 10/04/17  9:46 PM  Result Value Ref Range Status   MRSA by PCR NEGATIVE NEGATIVE Final    Comment:        The GeneXpert MRSA Assay (FDA approved for NASAL  specimens only), is one component of a comprehensive MRSA colonization surveillance program. It is not intended to diagnose MRSA infection nor to guide or monitor treatment for MRSA infections. Performed at [facility], [address], [place], [place] [address]   Aerobic Culture (superficial specimen)     Status: None (Preliminary result)   Collection Time: 10/05/17 10:45 AM  Result Value Ref Range Status   Specimen Description   Final    FACE LEFT CHEEK Performed at [facility], [address] [place], [place] [address]    Special Requests   Final    Normal Performed at [facility], [address], [place], [place] [address]    Gram Stain   Final    RARE WBC PRESENT, PREDOMINANTLY PMN NO ORGANISMS SEEN    Culture   Final    FEW STAPHYLOCOCCUS AUREUS SUSCEPTIBILITIES TO FOLLOW Performed at [facility], [place] [address], [place], [place] [address]    Report Status PENDING  Incomplete     Labs: BNP (last 3 results) No results for input(s): BNP in the last 8760 hours. Basic Metabolic Panel: Recent Labs  Lab 10/04/17 1500 10/05/17 0403 10/06/17 0451 10/07/17 0647  NA 133* 138 137 135  K 4.6 4.9 5.2* 4.8  CL 98* 106 107 107  CO2 25 22 23 24   GLUCOSE 189* 119* 134* 115*  BUN 32* 26* 25* 20  CREATININE 2.16* 1.70* 1.43* 1.36*  CALCIUM 8.5* 8.0* 8.1* 7.8*   Liver Function Tests: Recent Labs  Lab 10/04/17 1500  AST 25  ALT 11*  ALKPHOS 117  BILITOT 0.6  PROT 6.5  ALBUMIN 2.8*   No results for input(s): LIPASE, AMYLASE in the last 168 hours. No results for input(s): AMMONIA in the last 168 hours. CBC: Recent Labs  Lab 10/04/17 1500 10/05/17 0403 10/06/17 0451 10/07/17 0647 10/08/17 0409  WBC 252.1* 216.0* 200.1* 203.9* 202.9*  NEUTROABS 15.1*  --   --   --   --   HGB 6.8* 8.7* 8.9* 8.6* 8.1*  HCT 21.0* 26.4* 29.2* 29.2* 27.8*  MCV 116.0* 99.6 105.4* 107.4* 108.6*  PLT 69* 53* 57* 47* 49*   Cardiac Enzymes: No results for input(s):  CKTOTAL, CKMB, CKMBINDEX, TROPONINI in the last 168 hours. BNP: Invalid input(s): POCBNP CBG: Recent Labs  Lab 10/07/17 0731  10/07/17 1138 10/07/17 1616 10/07/17 2203 10/08/17 0743  GLUCAP 105* 155* 131* 147* 99   D-Dimer No results for input(s): DDIMER in the last 72 hours. Hgb A1c No results for input(s): HGBA1C in the last 72 hours. Lipid Profile No results for input(s): CHOL, HDL, LDLCALC, TRIG, CHOLHDL, LDLDIRECT in the last 72 hours. Thyroid function studies No results for input(s): TSH, T4TOTAL, T3FREE, THYROIDAB in the last 72 hours.  Invalid input(s): FREET3 Anemia work up No results for input(s): VITAMINB12, FOLATE, FERRITIN, TIBC, IRON, RETICCTPCT in the last 72 hours. Urinalysis    Component Value Date/Time   COLORURINE YELLOW 10/04/2017 [place] 10/04/2017 1755   LABSPEC 1.008 10/04/2017 1755   PHURINE 6.0 10/04/2017 [place] 10/04/2017 1755   HGBUR NEGATIVE 10/04/2017 [place] 10/04/2017 [place] 10/04/2017 1755   PROTEINUR NEGATIVE 10/04/2017 1755   UROBILINOGEN 0.2 02/11/2015 1718   NITRITE NEGATIVE 10/04/2017 1755   LEUKOCYTESUR NEGATIVE 10/04/2017 1755   Sepsis Labs Invalid input(s): PROCALCITONIN,  WBC,  LACTICIDVEN Microbiology Recent Results (from the past 240 hour(s))  Culture, blood (Routine X 2) w Reflex to ID Panel     Status: None (Preliminary result)   Collection Time: 10/04/17  7:47 PM  Result Value Ref Range Status   Specimen Description LEFT ANTECUBITAL  Final   Special Requests   Final    BOTTLES DRAWN AEROBIC AND ANAEROBIC Blood Culture adequate volume   Culture   Final    NO GROWTH 4 DAYS Performed at [facility], [address] [place], [place] [address]    Report Status PENDING  Incomplete  Culture, blood (Routine X 2) w Reflex to ID Panel     Status: None (Preliminary result)   Collection Time: 10/04/17  7:48 PM  Result Value Ref Range Status   Specimen  Description BLOOD RIGHT HAND  Final   Special Requests   Final    BOTTLES DRAWN AEROBIC AND ANAEROBIC Blood Culture adequate volume   Culture   Final    NO GROWTH 4 DAYS Performed at [facility], [address] [place], [place] [address]    Report Status PENDING  Incomplete  MRSA PCR Screening     Status: None   Collection Time: 10/04/17  9:46 PM  Result Value Ref Range Status   MRSA by PCR NEGATIVE NEGATIVE Final    Comment:        The GeneXpert MRSA Assay (FDA approved for NASAL specimens only), is one component of a comprehensive MRSA colonization surveillance program. It is not intended to diagnose MRSA infection nor to guide or monitor treatment for MRSA infections. Performed at [facility], [address], [place], [place] [address]   Aerobic Culture (superficial specimen)     Status: None (Preliminary result)   Collection Time: 10/05/17 10:45 AM  Result Value Ref Range Status   Specimen Description   Final    FACE LEFT CHEEK Performed at [facility], [address] [place], [place] [address]    Special Requests   Final    Normal Performed at [facility], [address] [place], [place] [address]    Gram Stain   Final    RARE WBC PRESENT, PREDOMINANTLY PMN NO ORGANISMS SEEN    Culture   Final    FEW STAPHYLOCOCCUS AUREUS SUSCEPTIBILITIES TO FOLLOW Performed at [facility], [place] [address], [place], [place] [address]    Report Status PENDING  Incomplete  Time coordinating discharge: 35 minutes  SIGNED:   Louellen Molder, MD  [facility] 10/08/2017, 10:35 AM Pager   If 7PM-7AM, please contact night-coverage [email or web address] Password [email or web address]

## 2017-10-08 NOTE — Progress Notes (Signed)
IV discontinued,catheter intact. Discharge instructions given on medications and follow up visits,patient and family verbalized understanding. Accompanied by staff to an awaiting vehicle.

## 2017-10-09 DIAGNOSIS — Z7984 Long term (current) use of oral hypoglycemic drugs: Secondary | ICD-10-CM | POA: Diagnosis not present

## 2017-10-09 DIAGNOSIS — E538 Deficiency of other specified B group vitamins: Secondary | ICD-10-CM | POA: Diagnosis not present

## 2017-10-09 DIAGNOSIS — L0201 Cutaneous abscess of face: Secondary | ICD-10-CM | POA: Diagnosis not present

## 2017-10-09 DIAGNOSIS — E1122 Type 2 diabetes mellitus with diabetic chronic kidney disease: Secondary | ICD-10-CM | POA: Diagnosis not present

## 2017-10-09 DIAGNOSIS — E785 Hyperlipidemia, unspecified: Secondary | ICD-10-CM | POA: Diagnosis not present

## 2017-10-09 DIAGNOSIS — J0100 Acute maxillary sinusitis, unspecified: Secondary | ICD-10-CM | POA: Diagnosis not present

## 2017-10-09 DIAGNOSIS — N183 Chronic kidney disease, stage 3 (moderate): Secondary | ICD-10-CM | POA: Diagnosis not present

## 2017-10-09 DIAGNOSIS — Z87891 Personal history of nicotine dependence: Secondary | ICD-10-CM | POA: Diagnosis not present

## 2017-10-09 DIAGNOSIS — I483 Typical atrial flutter: Secondary | ICD-10-CM | POA: Diagnosis not present

## 2017-10-09 DIAGNOSIS — Z95810 Presence of automatic (implantable) cardiac defibrillator: Secondary | ICD-10-CM | POA: Diagnosis not present

## 2017-10-09 DIAGNOSIS — C9110 Chronic lymphocytic leukemia of B-cell type not having achieved remission: Secondary | ICD-10-CM | POA: Diagnosis not present

## 2017-10-09 DIAGNOSIS — J189 Pneumonia, unspecified organism: Secondary | ICD-10-CM | POA: Diagnosis not present

## 2017-10-09 LAB — CULTURE, BLOOD (ROUTINE X 2)
CULTURE: NO GROWTH
CULTURE: NO GROWTH
SPECIAL REQUESTS: ADEQUATE
SPECIAL REQUESTS: ADEQUATE

## 2017-10-10 DIAGNOSIS — Z6823 Body mass index (BMI) 23.0-23.9, adult: Secondary | ICD-10-CM | POA: Diagnosis not present

## 2017-10-10 DIAGNOSIS — L0201 Cutaneous abscess of face: Secondary | ICD-10-CM | POA: Diagnosis not present

## 2017-10-10 DIAGNOSIS — R110 Nausea: Secondary | ICD-10-CM | POA: Diagnosis not present

## 2017-10-10 DIAGNOSIS — D509 Iron deficiency anemia, unspecified: Secondary | ICD-10-CM | POA: Diagnosis not present

## 2017-10-14 ENCOUNTER — Encounter (HOSPITAL_COMMUNITY): Payer: Medicare Other

## 2017-10-14 DIAGNOSIS — E785 Hyperlipidemia, unspecified: Secondary | ICD-10-CM | POA: Diagnosis not present

## 2017-10-14 DIAGNOSIS — Z7984 Long term (current) use of oral hypoglycemic drugs: Secondary | ICD-10-CM | POA: Diagnosis not present

## 2017-10-14 DIAGNOSIS — L0201 Cutaneous abscess of face: Secondary | ICD-10-CM | POA: Diagnosis not present

## 2017-10-14 DIAGNOSIS — N183 Chronic kidney disease, stage 3 (moderate): Secondary | ICD-10-CM | POA: Diagnosis not present

## 2017-10-14 DIAGNOSIS — I483 Typical atrial flutter: Secondary | ICD-10-CM | POA: Diagnosis not present

## 2017-10-14 DIAGNOSIS — E538 Deficiency of other specified B group vitamins: Secondary | ICD-10-CM | POA: Diagnosis not present

## 2017-10-14 DIAGNOSIS — J189 Pneumonia, unspecified organism: Secondary | ICD-10-CM | POA: Diagnosis not present

## 2017-10-14 DIAGNOSIS — C9110 Chronic lymphocytic leukemia of B-cell type not having achieved remission: Secondary | ICD-10-CM | POA: Diagnosis not present

## 2017-10-14 DIAGNOSIS — Z95810 Presence of automatic (implantable) cardiac defibrillator: Secondary | ICD-10-CM | POA: Diagnosis not present

## 2017-10-14 DIAGNOSIS — E1122 Type 2 diabetes mellitus with diabetic chronic kidney disease: Secondary | ICD-10-CM | POA: Diagnosis not present

## 2017-10-14 DIAGNOSIS — Z87891 Personal history of nicotine dependence: Secondary | ICD-10-CM | POA: Diagnosis not present

## 2017-10-17 DIAGNOSIS — C9110 Chronic lymphocytic leukemia of B-cell type not having achieved remission: Secondary | ICD-10-CM | POA: Diagnosis not present

## 2017-10-17 DIAGNOSIS — E1122 Type 2 diabetes mellitus with diabetic chronic kidney disease: Secondary | ICD-10-CM | POA: Diagnosis not present

## 2017-10-17 DIAGNOSIS — E785 Hyperlipidemia, unspecified: Secondary | ICD-10-CM | POA: Diagnosis not present

## 2017-10-17 DIAGNOSIS — J189 Pneumonia, unspecified organism: Secondary | ICD-10-CM | POA: Diagnosis not present

## 2017-10-17 DIAGNOSIS — E538 Deficiency of other specified B group vitamins: Secondary | ICD-10-CM | POA: Diagnosis not present

## 2017-10-17 DIAGNOSIS — L0201 Cutaneous abscess of face: Secondary | ICD-10-CM | POA: Diagnosis not present

## 2017-10-17 DIAGNOSIS — Z7984 Long term (current) use of oral hypoglycemic drugs: Secondary | ICD-10-CM | POA: Diagnosis not present

## 2017-10-17 DIAGNOSIS — I483 Typical atrial flutter: Secondary | ICD-10-CM | POA: Diagnosis not present

## 2017-10-17 DIAGNOSIS — Z87891 Personal history of nicotine dependence: Secondary | ICD-10-CM | POA: Diagnosis not present

## 2017-10-17 DIAGNOSIS — Z95810 Presence of automatic (implantable) cardiac defibrillator: Secondary | ICD-10-CM | POA: Diagnosis not present

## 2017-10-17 DIAGNOSIS — N183 Chronic kidney disease, stage 3 (moderate): Secondary | ICD-10-CM | POA: Diagnosis not present

## 2017-10-22 DIAGNOSIS — Z87891 Personal history of nicotine dependence: Secondary | ICD-10-CM | POA: Diagnosis not present

## 2017-10-22 DIAGNOSIS — Z95810 Presence of automatic (implantable) cardiac defibrillator: Secondary | ICD-10-CM | POA: Diagnosis not present

## 2017-10-22 DIAGNOSIS — J189 Pneumonia, unspecified organism: Secondary | ICD-10-CM | POA: Diagnosis not present

## 2017-10-22 DIAGNOSIS — E785 Hyperlipidemia, unspecified: Secondary | ICD-10-CM | POA: Diagnosis not present

## 2017-10-22 DIAGNOSIS — N183 Chronic kidney disease, stage 3 (moderate): Secondary | ICD-10-CM | POA: Diagnosis not present

## 2017-10-22 DIAGNOSIS — I483 Typical atrial flutter: Secondary | ICD-10-CM | POA: Diagnosis not present

## 2017-10-22 DIAGNOSIS — Z7984 Long term (current) use of oral hypoglycemic drugs: Secondary | ICD-10-CM | POA: Diagnosis not present

## 2017-10-22 DIAGNOSIS — E1122 Type 2 diabetes mellitus with diabetic chronic kidney disease: Secondary | ICD-10-CM | POA: Diagnosis not present

## 2017-10-22 DIAGNOSIS — C9110 Chronic lymphocytic leukemia of B-cell type not having achieved remission: Secondary | ICD-10-CM | POA: Diagnosis not present

## 2017-10-22 DIAGNOSIS — L0201 Cutaneous abscess of face: Secondary | ICD-10-CM | POA: Diagnosis not present

## 2017-10-22 DIAGNOSIS — E538 Deficiency of other specified B group vitamins: Secondary | ICD-10-CM | POA: Diagnosis not present

## 2017-10-24 ENCOUNTER — Ambulatory Visit (INDEPENDENT_AMBULATORY_CARE_PROVIDER_SITE_OTHER): Payer: Medicare Other | Admitting: Otolaryngology

## 2017-10-24 DIAGNOSIS — H6123 Impacted cerumen, bilateral: Secondary | ICD-10-CM | POA: Diagnosis not present

## 2017-10-24 DIAGNOSIS — K1121 Acute sialoadenitis: Secondary | ICD-10-CM | POA: Diagnosis not present

## 2017-10-25 ENCOUNTER — Ambulatory Visit (HOSPITAL_COMMUNITY)
Admission: RE | Admit: 2017-10-25 | Discharge: 2017-10-25 | Disposition: A | Discharge status: Home / Self Care | Payer: Medicare Other | Source: Ambulatory Visit | Attending: Nurse Practitioner | Admitting: Nurse Practitioner

## 2017-10-25 ENCOUNTER — Other Ambulatory Visit (HOSPITAL_COMMUNITY): Payer: Self-pay | Admitting: Nurse Practitioner

## 2017-10-25 DIAGNOSIS — R918 Other nonspecific abnormal finding of lung field: Secondary | ICD-10-CM | POA: Diagnosis not present

## 2017-10-25 DIAGNOSIS — J151 Pneumonia due to Pseudomonas: Secondary | ICD-10-CM | POA: Insufficient documentation

## 2017-10-30 ENCOUNTER — Inpatient Hospital Stay (HOSPITAL_COMMUNITY): Payer: Medicare Other | Attending: Hematology

## 2017-10-30 DIAGNOSIS — E1122 Type 2 diabetes mellitus with diabetic chronic kidney disease: Secondary | ICD-10-CM | POA: Diagnosis not present

## 2017-10-30 DIAGNOSIS — E538 Deficiency of other specified B group vitamins: Secondary | ICD-10-CM | POA: Insufficient documentation

## 2017-10-30 DIAGNOSIS — E440 Moderate protein-calorie malnutrition: Secondary | ICD-10-CM | POA: Insufficient documentation

## 2017-10-30 DIAGNOSIS — C9110 Chronic lymphocytic leukemia of B-cell type not having achieved remission: Secondary | ICD-10-CM | POA: Insufficient documentation

## 2017-10-30 DIAGNOSIS — I129 Hypertensive chronic kidney disease with stage 1 through stage 4 chronic kidney disease, or unspecified chronic kidney disease: Secondary | ICD-10-CM | POA: Diagnosis not present

## 2017-10-30 DIAGNOSIS — N183 Chronic kidney disease, stage 3 (moderate): Secondary | ICD-10-CM | POA: Diagnosis not present

## 2017-10-30 DIAGNOSIS — D630 Anemia in neoplastic disease: Secondary | ICD-10-CM | POA: Insufficient documentation

## 2017-10-30 DIAGNOSIS — D649 Anemia, unspecified: Secondary | ICD-10-CM

## 2017-10-30 DIAGNOSIS — D6959 Other secondary thrombocytopenia: Secondary | ICD-10-CM | POA: Insufficient documentation

## 2017-10-30 DIAGNOSIS — E883 Tumor lysis syndrome: Secondary | ICD-10-CM | POA: Diagnosis not present

## 2017-10-30 DIAGNOSIS — Z950 Presence of cardiac pacemaker: Secondary | ICD-10-CM | POA: Diagnosis not present

## 2017-10-30 LAB — IRON AND TIBC
Iron: 99 ug/dL (ref 45–182)
Saturation Ratios: 40 % — ABNORMAL HIGH (ref 17.9–39.5)
TIBC: 249 ug/dL — ABNORMAL LOW (ref 250–450)
UIBC: 150 ug/dL

## 2017-10-30 LAB — CBC WITH DIFFERENTIAL/PLATELET
BASOS ABS: 0 10*3/uL (ref 0.0–0.1)
BLASTS: 0 %
Band Neutrophils: 0 %
Basophils Relative: 0 %
Eosinophils Absolute: 0 10*3/uL (ref 0.0–0.7)
Eosinophils Relative: 0 %
HCT: 27 % — ABNORMAL LOW (ref 39.0–52.0)
HEMOGLOBIN: 8.3 g/dL — AB (ref 13.0–17.0)
Lymphocytes Relative: 99 %
Lymphs Abs: 293 10*3/uL — ABNORMAL HIGH (ref 0.7–4.0)
MCH: 33.9 pg (ref 26.0–34.0)
MCHC: 30.7 g/dL (ref 30.0–36.0)
MCV: 110.2 fL — ABNORMAL HIGH (ref 78.0–100.0)
METAMYELOCYTES PCT: 0 %
MONOS PCT: 0 %
MYELOCYTES: 0 %
Monocytes Absolute: 0 10*3/uL — ABNORMAL LOW (ref 0.1–1.0)
Neutro Abs: 3 10*3/uL (ref 1.7–7.7)
Neutrophils Relative %: 1 %
Other: 0 %
Platelets: 80 10*3/uL — ABNORMAL LOW (ref 150–400)
Promyelocytes Relative: 0 %
RBC: 2.45 MIL/uL — ABNORMAL LOW (ref 4.22–5.81)
RDW: 23.5 % — ABNORMAL HIGH (ref 11.5–15.5)
WBC: 296 10*3/uL — AB (ref 4.0–10.5)
nRBC: 0 /100 WBC

## 2017-10-30 LAB — COMPREHENSIVE METABOLIC PANEL
ALBUMIN: 3.1 g/dL — AB (ref 3.5–5.0)
ALK PHOS: 130 U/L — AB (ref 38–126)
ALT: 20 U/L (ref 17–63)
AST: 32 U/L (ref 15–41)
Anion gap: 7 (ref 5–15)
BILIRUBIN TOTAL: 0.4 mg/dL (ref 0.3–1.2)
BUN: 27 mg/dL — AB (ref 6–20)
CALCIUM: 8.5 mg/dL — AB (ref 8.9–10.3)
CO2: 26 mmol/L (ref 22–32)
Chloride: 105 mmol/L (ref 101–111)
Creatinine, Ser: 1.95 mg/dL — ABNORMAL HIGH (ref 0.61–1.24)
GFR calc Af Amer: 36 mL/min — ABNORMAL LOW (ref >60)
GFR calc non Af Amer: 31 mL/min — ABNORMAL LOW (ref >60)
GLUCOSE: 144 mg/dL — AB (ref 65–99)
Potassium: 4.6 mmol/L (ref 3.5–5.1)
Sodium: 138 mmol/L (ref 135–145)
TOTAL PROTEIN: 7.6 g/dL (ref 6.5–8.1)

## 2017-10-30 LAB — FOLATE: Folate: 8.1 ng/mL (ref >5.9)

## 2017-10-30 LAB — VITAMIN B12: Vitamin B-12: 436 pg/mL (ref 180–914)

## 2017-10-30 LAB — FERRITIN: FERRITIN: 836 ng/mL — AB (ref 24–336)

## 2017-10-30 NOTE — Progress Notes (Signed)
CRITICAL VALUE ALERT Critical value received:  WBC 296.0 Date of notification:  10-30-2017 Time of notification: 1215 Critical value read back:  Yes.   Nurse who received alert:  C. Page RN MD notified (1st page):  Dr. Delton Coombes

## 2017-10-31 LAB — KAPPA/LAMBDA LIGHT CHAINS
KAPPA, LAMDA LIGHT CHAIN RATIO: 11.33 — AB (ref 0.26–1.65)
Kappa free light chain: 277.6 mg/L — ABNORMAL HIGH (ref 3.3–19.4)
Lambda free light chains: 24.5 mg/L (ref 5.7–26.3)

## 2017-10-31 LAB — MULTIPLE MYELOMA PANEL, SERUM
Albumin SerPl Elph-Mcnc: 2.9 g/dL (ref 2.9–4.4)
Albumin/Glob SerPl: 0.8 (ref 0.7–1.7)
Alpha 1: 0.4 g/dL (ref 0.0–0.4)
Alpha2 Glob SerPl Elph-Mcnc: 1.2 g/dL — ABNORMAL HIGH (ref 0.4–1.0)
B-GLOBULIN SERPL ELPH-MCNC: 0.9 g/dL (ref 0.7–1.3)
Gamma Glob SerPl Elph-Mcnc: 1.3 g/dL (ref 0.4–1.8)
Globulin, Total: 3.8 g/dL (ref 2.2–3.9)
IGA: 113 mg/dL (ref 61–437)
IgG (Immunoglobin G), Serum: 1422 mg/dL (ref 700–1600)
IgM (Immunoglobulin M), Srm: 45 mg/dL (ref 15–143)
M PROTEIN SERPL ELPH-MCNC: 0.5 g/dL — AB
TOTAL PROTEIN ELP: 6.7 g/dL (ref 6.0–8.5)

## 2017-10-31 LAB — BETA 2 MICROGLOBULIN, SERUM: BETA 2 MICROGLOBULIN: 13.4 mg/L — AB (ref 0.6–2.4)

## 2017-10-31 LAB — ERYTHROPOIETIN: Erythropoietin: 169.8 m[IU]/mL — ABNORMAL HIGH (ref 2.6–18.5)

## 2017-11-01 ENCOUNTER — Other Ambulatory Visit (HOSPITAL_COMMUNITY): Payer: Medicare Other

## 2017-11-01 ENCOUNTER — Inpatient Hospital Stay (HOSPITAL_COMMUNITY): Payer: Medicare Other

## 2017-11-01 ENCOUNTER — Other Ambulatory Visit: Payer: Self-pay

## 2017-11-01 ENCOUNTER — Encounter (HOSPITAL_COMMUNITY): Payer: Self-pay | Admitting: Oncology

## 2017-11-01 ENCOUNTER — Inpatient Hospital Stay (HOSPITAL_BASED_OUTPATIENT_CLINIC_OR_DEPARTMENT_OTHER): Payer: Medicare Other | Admitting: Oncology

## 2017-11-01 ENCOUNTER — Telehealth (HOSPITAL_COMMUNITY): Payer: Self-pay

## 2017-11-01 ENCOUNTER — Ambulatory Visit (HOSPITAL_COMMUNITY): Payer: Medicare Other | Admitting: Hematology

## 2017-11-01 ENCOUNTER — Ambulatory Visit (HOSPITAL_COMMUNITY): Payer: Medicare Other | Admitting: Oncology

## 2017-11-01 VITALS — BP 97/56 | HR 86 | Temp 99.1°F | Resp 18 | Wt 118.0 lb

## 2017-11-01 DIAGNOSIS — L0201 Cutaneous abscess of face: Secondary | ICD-10-CM

## 2017-11-01 DIAGNOSIS — N183 Chronic kidney disease, stage 3 unspecified: Secondary | ICD-10-CM

## 2017-11-01 DIAGNOSIS — J189 Pneumonia, unspecified organism: Secondary | ICD-10-CM

## 2017-11-01 DIAGNOSIS — E119 Type 2 diabetes mellitus without complications: Secondary | ICD-10-CM

## 2017-11-01 DIAGNOSIS — I129 Hypertensive chronic kidney disease with stage 1 through stage 4 chronic kidney disease, or unspecified chronic kidney disease: Secondary | ICD-10-CM

## 2017-11-01 DIAGNOSIS — R110 Nausea: Secondary | ICD-10-CM | POA: Diagnosis not present

## 2017-11-01 DIAGNOSIS — E1122 Type 2 diabetes mellitus with diabetic chronic kidney disease: Secondary | ICD-10-CM | POA: Diagnosis not present

## 2017-11-01 DIAGNOSIS — C9110 Chronic lymphocytic leukemia of B-cell type not having achieved remission: Secondary | ICD-10-CM | POA: Diagnosis not present

## 2017-11-01 DIAGNOSIS — Z950 Presence of cardiac pacemaker: Secondary | ICD-10-CM | POA: Diagnosis not present

## 2017-11-01 DIAGNOSIS — E538 Deficiency of other specified B group vitamins: Secondary | ICD-10-CM

## 2017-11-01 DIAGNOSIS — E875 Hyperkalemia: Secondary | ICD-10-CM

## 2017-11-01 DIAGNOSIS — E440 Moderate protein-calorie malnutrition: Secondary | ICD-10-CM

## 2017-11-01 DIAGNOSIS — Z7189 Other specified counseling: Secondary | ICD-10-CM

## 2017-11-01 DIAGNOSIS — I10 Essential (primary) hypertension: Secondary | ICD-10-CM

## 2017-11-01 DIAGNOSIS — D72820 Lymphocytosis (symptomatic): Secondary | ICD-10-CM

## 2017-11-01 DIAGNOSIS — E883 Tumor lysis syndrome: Secondary | ICD-10-CM

## 2017-11-01 DIAGNOSIS — J181 Lobar pneumonia, unspecified organism: Secondary | ICD-10-CM | POA: Diagnosis not present

## 2017-11-01 DIAGNOSIS — D6959 Other secondary thrombocytopenia: Secondary | ICD-10-CM | POA: Diagnosis not present

## 2017-11-01 DIAGNOSIS — D630 Anemia in neoplastic disease: Secondary | ICD-10-CM

## 2017-11-01 DIAGNOSIS — L03211 Cellulitis of face: Secondary | ICD-10-CM | POA: Diagnosis not present

## 2017-11-01 DIAGNOSIS — D696 Thrombocytopenia, unspecified: Secondary | ICD-10-CM

## 2017-11-01 DIAGNOSIS — Z6822 Body mass index (BMI) 22.0-22.9, adult: Secondary | ICD-10-CM | POA: Diagnosis not present

## 2017-11-01 MED ORDER — CYANOCOBALAMIN 1000 MCG/ML IJ SOLN
1000.0000 ug | Freq: Once | INTRAMUSCULAR | Status: AC
  Administered 2017-11-01: 1000 ug via INTRAMUSCULAR
  Filled 2017-11-01: qty 1

## 2017-11-01 MED ORDER — ALLOPURINOL 100 MG PO TABS: 100.0000 mg | ORAL_TABLET | Freq: Every day | ORAL | 1 refills | Status: AC

## 2017-11-01 MED ORDER — IBRUTINIB 140 MG PO CAPS: ORAL_CAPSULE | ORAL | 1 refills | Status: DC

## 2017-11-01 NOTE — Progress Notes (Signed)
t       [name], MD [address] [place] [address]  CLL (chronic lymphocytic leukemia) ([place]) - Plan: CBC with Differential, Comprehensive metabolic panel, Lactate dehydrogenase, Uric acid, Phosphorus, allopurinol (ZYLOPRIM) 100 MG tablet  B12 deficiency  Tumor lysis syndrome - Plan: allopurinol (ZYLOPRIM) 100 MG tablet  Thrombocytopenia (HCC)  Pacemaker  Malnutrition of moderate degree (HCC)  Lymphocytosis  Hyperkalemia  Essential hypertension  HCAP (healthcare-associated pneumonia)  Goals of care, counseling/discussion  Facial abscess  Type 2 diabetes mellitus without complication, without long-term current use of insulin (HCC)  CKD (chronic kidney disease), stage III (HCC)  Anemia in neoplastic disease   HISTORY OF PRESENT ILLNESS: CLL with history of poor tolerance to past chemotherapies: Could not tolerate past treatments including FCR in 2010, Imbruvica in 7824 (complicated by a large leg rash), Zydelig in 2353 (complicated by transaminitis despite dose reduction), Imbruvica rechallenge in 6144 (complicated by infection and progressive fatigue), bendamustine/Rituxan x1 cycle in 3154 (complicated by prolonged and significant cytopenia in addition to tumor lysis syndrome and renal failure), and obinutuzumab/chlorambucil.  Unfortunately, his lymphocyte count is climbing significantly with progressive anemia and thrombocytopenia, fatigue, weight loss, early satiety, and night sweats.  As a result, we are in a situation in which we will need to reinstitute therapy and given his past experiences, I am fearful he will not do well with treatment.  As result, we will start with Imbruvica at a reduced dose and monitor him closely for toxicity and tumor lysis syndrome.  I am also concerned about his increased risk for infection and some medical oncologist argue that patients with CLL (who are already immunocompromise from their disease status) who will be on  ibrutinib therapy should be on PCP prophylaxis.  The counter argument to this practice is the fact that most patients who are on ibrutinib therapy tolerated well and responded well and are typically on this medication for a long period of time and chronic antibacterial prophylaxis can alter the patient's biome and result in atypical infections.  CURRENT THERAPY: Surveillance.  CURRENT STATUS: Randall Reyes 82 y.o. male returns for followup of in follow-up of CLL having been on active surveillance due to intolerance to previous chemotherapy regimens.  Today, he presents after being in the hospital with a facial cellulitis of an abscess of the left angle of jaw area.  He was treated in the hospital with IV antibiotics with transition to oral antibiotics.  He has completed his antibiotic regimen.  His infection has resolved clinically.  He admits to weight loss, fatigue, tiredness, B symptoms, early satiety, decreased appetite.  This in addition to his lab abnormalities is very concerning for progressive CLL.  We had a long discussion today about treatment versus ongoing observation.    Review of Systems  Constitutional: Positive for malaise/fatigue and weight loss. Negative for chills and fever.  HENT: Negative.   Eyes: Negative.   Respiratory: Positive for cough.   Cardiovascular: Negative.  Negative for chest pain.  Gastrointestinal: Negative.  Negative for blood in stool, constipation, diarrhea, melena, nausea and vomiting.  Genitourinary: Negative.   Musculoskeletal: Negative.   Skin: Negative.  Negative for rash.  Neurological: Negative.  Negative for weakness.  Endo/Heme/Allergies: Negative.   Psychiatric/Behavioral: Negative.     Past Medical History:  Diagnosis Date  . Anemia   . B12 deficiency 12/07/2014  . CLL (chronic lymphocytic leukemia) ([place]) 01/24/2011  . Diabetes mellitus   . DM (diabetes mellitus) ([place]) 01/24/2011  .  Eczema 01/24/2011  . History of pneumonia 04/2014    . Hypertension   . Leukemia ([place]) 6.22.2012   PER PATIENT  . Melanoma in situ ([place]) 01/24/2011  . Mild obesity 01/24/2011  . Port catheter in place 05/02/2012  . Shingles   . Ulcer     Past Surgical History:  Procedure Laterality Date  . bleeding ulcer    . CATARACT EXTRACTION W/PHACO  05/05/2012   Procedure: CATARACT EXTRACTION PHACO AND INTRAOCULAR LENS PLACEMENT (IOC);  Surgeon: Tonny Branch, MD;  Location: [facility];  Service: Ophthalmology;  Laterality: Right;  CDE:13.25  . CATARACT EXTRACTION W/PHACO  05/15/2012   Procedure: CATARACT EXTRACTION PHACO AND INTRAOCULAR LENS PLACEMENT (IOC);  Surgeon: Tonny Branch, MD;  Location: [facility];  Service: Ophthalmology;  Laterality: Left;  CDE:  12.32  . COLONOSCOPY N/A 01/30/2016   Procedure: COLONOSCOPY;  Surgeon: Danie Binder, MD;  Location: [facility];  Service: Endoscopy;  Laterality: N/A;  2:00 PM - moved to 1:00 - office notified pt  . ESOPHAGOGASTRODUODENOSCOPY  2008   Dr. Oneida Alar: normal esophagus, antral erythema, 1 cm clean based duodenal ulcera, negative H. plyori  . ESOPHAGOGASTRODUODENOSCOPY N/A 01/30/2016   Procedure: ESOPHAGOGASTRODUODENOSCOPY (EGD);  Surgeon: Danie Binder, MD;  Location: [facility];  Service: Endoscopy;  Laterality: N/A;  . HERNIA REPAIR  2001  . PACEMAKER IMPLANT N/A 02/07/2017   Procedure: PACEMAKER IMPLANT;  Surgeon: Constance Haw, MD;  Location: [facility];  Service: Cardiovascular;  Laterality: N/A;  . PORTACATH PLACEMENT  2008    Family History  Problem Relation Age of Onset  . Diabetes Father   . Colon cancer Neg Hx     Social History   Socioeconomic History  . Marital status: Married    Spouse name: Not on file  . Number of children: Not on file  . Years of education: Not on file  . Highest education level: Not on file  Occupational History  . Not on file  Social Needs  . Financial resource strain: Not on file  . Food insecurity:    Worry: Not on file    Inability: Not  on file  . Transportation needs:    Medical: Not on file    Non-medical: Not on file  Tobacco Use  . Smoking status: Former Smoker    Last attempt to quit: 09/11/1977    Years since quitting: 40.1  . Smokeless tobacco: Former Systems developer    Quit date: 04/25/1967  Substance and Sexual Activity  . Alcohol use: No  . Drug use: No  . Sexual activity: Not on file  Lifestyle  . Physical activity:    Days per week: Not on file    Minutes per session: Not on file  . Stress: Not on file  Relationships  . Social connections:    Talks on phone: Not on file    Gets together: Not on file    Attends religious service: Not on file    Active member of club or organization: Not on file    Attends meetings of clubs or organizations: Not on file    Relationship status: Not on file  Other Topics Concern  . Not on file  Social History Narrative  . Not on file     PHYSICAL EXAMINATION  ECOG PERFORMANCE STATUS: 2 - Symptomatic, <50% confined to bed  There were no vitals filed for this visit.  Blood pressure today is 97/56, pulse 86, respirations 18, temperature 99.1 F, oxygen  saturation 98% on room air  GENERAL:alert, no distress, comfortable, cooperative, ill looking and accompanied by his wife and son SKIN: skin color, texture, turgor are normal, no rashes or significant lesions HEAD: Resolving left angle of jaw abscess. EYES: normal EARS: External ears normal OROPHARYNX:lips, buccal mucosa, and tongue normal  NECK: supple, trachea midline LYMPH:  no palpable lymphadenopathy, no hepatosplenomegaly BREAST:not examined LUNGS: clear to auscultation  HEART: regular rate & rhythm, no murmurs and no gallops ABDOMEN:abdomen soft, non-tender and normal bowel sounds BACK: Back symmetric, no curvature. EXTREMITIES:less then 2 second capillary refill, no joint deformities, effusion, or inflammation, no edema, no skin discoloration, no clubbing, no cyanosis  NEURO: alert & oriented x 3 with fluent  speech, no focal motor/sensory deficits, gait normal   LABORATORY DATA: CBC    Component Value Date/Time   WBC 296.0 (HH) 10/30/2017 1111   RBC 2.45 (L) 10/30/2017 1111   HGB 8.3 (L) 10/30/2017 1111   HCT 27.0 (L) 10/30/2017 1111   PLT 80 (L) 10/30/2017 1111   MCV 110.2 (H) 10/30/2017 1111   MCH 33.9 10/30/2017 1111   MCHC 30.7 10/30/2017 1111   RDW 23.5 (H) 10/30/2017 1111   LYMPHSABS 293.0 (H) 10/30/2017 1111   MONOABS 0.0 (L) 10/30/2017 1111   EOSABS 0.0 10/30/2017 1111   BASOSABS 0.0 10/30/2017 1111      Chemistry      Component Value Date/Time   NA 138 10/30/2017 1111   K 4.6 10/30/2017 1111   CL 105 10/30/2017 1111   CO2 26 10/30/2017 1111   BUN 27 (H) 10/30/2017 1111   CREATININE 1.95 (H) 10/30/2017 1111      Component Value Date/Time   CALCIUM 8.5 (L) 10/30/2017 1111   CALCIUM 8.3 (L) 02/03/2015 0639   ALKPHOS 130 (H) 10/30/2017 1111   AST 32 10/30/2017 1111   ALT 20 10/30/2017 1111   BILITOT 0.4 10/30/2017 1111     Lab Results  Component Value Date   IRON 99 10/30/2017   TIBC 249 (L) 10/30/2017   FERRITIN 836 (H) 10/30/2017   Lab Results  Component Value Date   VITAMINB12 436 10/30/2017   Lab Results  Component Value Date   FOLATE 8.1 10/30/2017    RADIOGRAPHIC STUDIES:  Dg Chest 2 View  Result Date: 10/25/2017 CLINICAL DATA:  History of recent pneumonia. History of CLL. History of melanoma. EXAM: CHEST - 2 VIEW COMPARISON:  CT 10/04/2017.  Chest x-ray 10/04/2017. FINDINGS: Port-A-Cath noted with tip over superior vena cava. Cardiac pacer with lead tips in the right atrium right ventricle. Cardiomegaly. Normal pulmonary vascularity. Left upper lung infiltrate again noted. Underlying mass lesion may be present. Continued follow-up exam suggested to demonstrate clearing. If clearing does not occur PET-CT should be considered. Heart size normal. No pleural effusion or pneumothorax. IMPRESSION: Persistent unchanged left upper lung infiltrate.  Underlying mass lesion may be present. Continued follow-up exams suggested to demonstrate clearing. If clearing does not occur PET-CT should be considered for further evaluation as malignancy cannot be excluded. Electronically Signed   By: Marcello Moores  Register   On: 10/25/2017 12:37   Dg Chest 2 View  Result Date: 10/04/2017 CLINICAL DATA:  Weakness EXAM: CHEST - 2 VIEW COMPARISON:  02/08/2017 chest radiograph. FINDINGS: Stable configuration of 2 lead left subclavian pacemaker. Right subclavian MediPort terminates in the lower third of the SVC. Stable cardiomediastinal silhouette with top-normal heart size. No pneumothorax. No pleural effusion. New patchy opacity in the posterior upper left lung. IMPRESSION: New patchy opacity  in the posterior upper left lung, suggestive of a pneumonia. Recommend follow-up PA and lateral post treatment chest radiographs in 4-6 weeks. Electronically Signed   By: Ilona Sorrel M.D.   On: 10/04/2017 16:45   Ct Chest Wo Contrast  Result Date: 10/04/2017 CLINICAL DATA:  Hypotension and weakness. Dyspnea. History of chronic lymphocytic leukemia. EXAM: CT CHEST WITHOUT CONTRAST TECHNIQUE: Multidetector CT imaging of the chest was performed following the standard protocol without IV contrast. COMPARISON:  Same day CXR and CXR from 02/08/2017, CT abdomen from 12/16/2015 FINDINGS: Cardiovascular: Mild aortic atherosclerosis without aneurysm. Conventional branch pattern of the great vessels. Cardiomegaly with coronary arteriosclerosis. Left-sided pacemaker apparatus with right atrial and right ventricular leads. Port catheter tip terminates in the distal SVC. Mediastinum/Nodes: Mediastinal and hilar lymphadenopathy are identified measuring up to 16 mm short axis along the right lower paratracheal portion of the mediastinum with a 14 mm AP window enlarged lymph node. Additional enlarged mediastinal lymph nodes are present, compatible with history of CLL. Patent trachea and mainstem bronchi.  Esophagus is unremarkable. Small hiatal hernia is noted. Lungs/Pleura: As suggested on chest radiograph, confluent pulmonary opacity in the posterior segment of left upper lobe suspicious for pneumonia is noted. This can mask underlying pulmonary lesions. Follow-up after appropriate antibiotic therapy is recommended to exclude neoplasm. No effusion or pneumothorax. Upper Abdomen: Portacaval, retroperitoneal and gastroduodenal adenopathy measuring up to 16 mm in the porta hepatis are identified. No splenomegaly. Musculoskeletal: Chronic lower thoracic compression fractures are noted. No aggressive nor acute osseous appearing abnormality. IMPRESSION: 1. New confluent left upper lobe pulmonary consolidations suspicious for pneumonia. Followup PA and lateral chest X-ray is recommended in 3-4 weeks following trial of antibiotic therapy to ensure resolution and exclude underlying malignancy. 2. Mediastinal and abdominal adenopathy compatible with history of CLL. 3. Cardiomegaly with coronary arteriosclerosis. Atherosclerotic nonaneurysmal aorta. Aortic Atherosclerosis (ICD10-I70.0). Electronically Signed   By: Ashley Royalty M.D.   On: 10/04/2017 19:04     PATHOLOGY:    ASSESSMENT AND PLAN:  CLL (chronic lymphocytic leukemia) ([place]) CLL with history of poor tolerance to past chemotherapies: Could not tolerate past treatments including FCR in 2010, Imbruvica in 5027 (complicated by a large leg rash), Zydelig in 7412 (complicated by transaminitis despite dose reduction), Imbruvica rechallenge in 8786 (complicated by infection and progressive fatigue), bendamustine/Rituxan x1 cycle in 7672 (complicated by prolonged and significant cytopenia in addition to tumor lysis syndrome and renal failure), and obinutuzumab/chlorambucil.  Unfortunately, his lymphocyte count is climbing significantly with progressive anemia and thrombocytopenia, fatigue, weight loss, early satiety, and night sweats.  As a result, we are in a  situation in which we will need to reinstitute therapy and given his past experiences, I am fearful he will not do well with treatment.  As result, we will start with Imbruvica at a reduced dose and monitor him closely for toxicity and tumor lysis syndrome.  I am also concerned about his increased risk for infection and some medical oncologist argue that patients with CLL (who are already immunocompromise from their disease status) who will be on ibrutinib therapy should be on PCP prophylaxis.  The counter argument to this practice is the fact that most patients who are on ibrutinib therapy tolerated well and responded well and are typically on this medication for a long period of time and chronic antibacterial prophylaxis can alter the patient's biome and result in atypical infections.    Laboratory work on 10/30/2017 reviewed: CBC diff, CMET, iron/TIBC, ferritin, B12, SPEP + IFE, light  chain assay, and B2M.  I personally reviewed and went over laboratory results with the patient.  The results are noted within this dictation.  Blood counts at that time demonstrate a white blood cell count of 296 with a lymphocyte count of 293, hemoglobin 8.3 g/dL, and platelet count 80,000.  Metabolic panel demonstrated stage III chronic renal disease.  Vitamin studies were stable.  Erythropoietin was elevated.  Multiple myeloma panel was abnormal and this is likely from CLL with an elevated light chain ratio and documented M spike.  Given the patient's lab abnormalities in addition to being clinically symptomatic, I think now is a time that we should reinstitute therapy.  Unfortunately, given the patient's previous treatments in the past, I am concerned about his ability to tolerate aggressive therapy.  Clinically, with other patients, I have had success with reduced dose Imbruvica.  There is argument for PCP prophylaxis in the setting of Imbruvica therapy.  I will defer this to patient's attending medical  oncologist.  Prescription is written for Imbruvica 140 mg capsules.  He will take 140 mg daily x7 days and increase to 280 mg daily thereafter if tolerated.  I would not hesitate to reduce the dose back to 140 mg if the higher dose is intolerable.  Maximum dose is 420 mg.  This prescription is printed and provided to front desk staff for insurance approval.  Given the high risk for tumor lysis syndrome, we will perform weekly laboratory work beginning in 2 weeks: CBC diff, CMET, LDH, uric acid, phosphorus.  Prescription for allopurinol 100 mg daily [facility] his [facility].  This dose is reduced given his kidney function and other comorbidities.  We will utilize this in an attempt to prevent tumor lysis syndrome.  If his uric acid demonstrates elevation, I would not hesitate to utilize rasburicase.  He will be referred for chemotherapy teaching.  He will need a refresher course.  He has been taught on this medication in the past.  I have made contact with his primary care provider, Dr. Nevada Crane, to update him with our plans for a medical oncology standpoint.  I recommended close follow-up from a primary care standpoint for his other medical issues given the fact that he will be restarted on therapy.  He will return in 3 weeks for follow-up.   2. B12 deficiency B12 within normal limits most recently.  On B12 replacement therapy.  B12 injection given today.  B12 monthly.  3. Tumor lysis syndrome History of tumor lysis syndrome with previous treatments.  I am concerned that he may experience tumor lysis syndrome with recommended treatment above.  I have started allopurinol 100 mg daily (reduced dose due to kidney function).  He will have laboratory work performed weekly once he starts ibrutinib and I would not hesitate to administer IV rasburicase if there are signs suggestive of tumor lysis syndrome.  4. Thrombocytopenia ([place]) Secondary to CLL  5. Pacemaker Monitored by cardiology  6.  Malnutrition of moderate degree (HCC) Significant weight loss over the last 3-4 months.  Likely secondary CLL  7. Lymphocytosis Secondary to CLL with a absolute lymphocytosis of 293  8. Hyperkalemia Resolved  9. Essential hypertension Blood pressure today is well controlled  10. HCAP (healthcare-associated pneumonia) Completed antibiotic therapy recently.  Has follow-up examination with Dr. Nevada Crane, primary care provider later today.  11. Goals of care, counseling/discussion We discussed treatment versus ongoing surveillance knowing that his multiple comorbidities in addition to his CLL will be problematic in  the future.  The patient and his family are interested in trying another treatment.  12. Facial abscess Status post antibiotic management.  Resolved.  Neutrophil count back to normal limits.  13. Type 2 diabetes mellitus without complication, without long-term current use of insulin ([place]) Noted  14. CKD (chronic kidney disease), stage III ([place]) Followed by nephrology.  15. 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   ORDERS PLACED FOR THIS ENCOUNTER: Orders Placed This Encounter  Procedures  . CBC with Differential  . Comprehensive metabolic panel  . Lactate dehydrogenase  . Uric acid  . Phosphorus    MEDICATIONS PRESCRIBED THIS ENCOUNTER: Meds ordered this encounter  Medications  . ibrutinib (IMBRUVICA) 140 MG capsul    Sig: Take 140 mg PO daily x 7 days and then increase to 280 mg PO daily thereafter    Dispense:  60 capsule    Refill:  1    Order Specific Question:   Supervising Provider    Answer:   Derek Jack [[id number]]  . allopurinol (ZYLOPRIM) 100 MG tablet    Sig: Take 1 tablet (100 mg total) by mouth daily.    Dispense:  90 tablet    Refill:  1    Order Specific Question:   Supervising Provider    Answer:   Derek Jack [[id number]]    THERAPY PLAN:  We will plan on starting Imbruvica therapy.  Prescription is  prescribed and provided to front desk staffing for insurance approval.  I would certainly treat him with a reduced dose of Imbruvica and possibly would not even consider attempting full dose Imbruvica therapy.  There is debate regarding the role of PCP prophylaxis in the setting.  I will defer this decision to his primary medical oncologist.  I have sent in a prescription for reduced dose allopurinol 100 mg in attempt to prevent tumor lysis syndrome.  His white blood cell count will rise before it comes down with ibrutinib therapy.  I would utilize rasburicase for signs suggestive of tumor lysis syndrome.  Other treatment options include venetoclax with Rituxan or Duvelisib.    All questions were answered. The patient knows to call the clinic with any problems, questions or concerns. We can certainly see the patient much sooner if necessary.  Patient and plan discussed with Dr. Derek Jack and she is in agreement with the aforementioned.   This note is electronically signed by: Robynn Pane, PA-C 11/01/2017 3:43 PM

## 2017-11-01 NOTE — Assessment & Plan Note (Addendum)
CLL with history of poor tolerance to past chemotherapies: Could not tolerate past treatments including FCR in 2010, Imbruvica in 0258 (complicated by a large leg rash), Zydelig in 5277 (complicated by transaminitis despite dose reduction), Imbruvica rechallenge in 8242 (complicated by infection and progressive fatigue), bendamustine/Rituxan x1 cycle in 3536 (complicated by prolonged and significant cytopenia in addition to tumor lysis syndrome and renal failure), and obinutuzumab/chlorambucil.  Unfortunately, his lymphocyte count is climbing significantly with progressive anemia and thrombocytopenia, fatigue, weight loss, early satiety, and night sweats.  As a result, we are in a situation in which we will need to reinstitute therapy and given his past experiences, I am fearful he will not do well with treatment.  As result, we will start with Imbruvica at a reduced dose and monitor him closely for toxicity and tumor lysis syndrome.  I am also concerned about his increased risk for infection and some medical oncologist argue that patients with CLL (who are already immunocompromise from their disease status) who will be on ibrutinib therapy should be on PCP prophylaxis.  The counter argument to this practice is the fact that most patients who are on ibrutinib therapy tolerated well and responded well and are typically on this medication for a long period of time and chronic antibacterial prophylaxis can alter the patient's biome and result in atypical infections.    Laboratory work on 10/30/2017 reviewed: CBC diff, CMET, iron/TIBC, ferritin, B12, SPEP + IFE, light chain assay, and B2M.  I personally reviewed and went over laboratory results with the patient.  The results are noted within this dictation.  Blood counts at that time demonstrate a white blood cell count of 296 with a lymphocyte count of 293, hemoglobin 8.3 g/dL, and platelet count 80,000.  Metabolic panel demonstrated stage III chronic renal disease.   Vitamin studies were stable.  Erythropoietin was elevated.  Multiple myeloma panel was abnormal and this is likely from CLL with an elevated light chain ratio and documented M spike.  Given the patient's lab abnormalities in addition to being clinically symptomatic, I think now is a time that we should reinstitute therapy.  Unfortunately, given the patient's previous treatments in the past, I am concerned about his ability to tolerate aggressive therapy.  Clinically, with other patients, I have had success with reduced dose Imbruvica.  There is argument for PCP prophylaxis in the setting of Imbruvica therapy.  I will defer this to patient's attending medical oncologist.  Prescription is written for Imbruvica 140 mg capsules.  He will take 140 mg daily x7 days and increase to 280 mg daily thereafter if tolerated.  I would not hesitate to reduce the dose back to 140 mg if the higher dose is intolerable.  Maximum dose is 420 mg.  This prescription is printed and provided to front desk staff for insurance approval.  Given the high risk for tumor lysis syndrome, we will perform weekly laboratory work beginning in 2 weeks: CBC diff, CMET, LDH, uric acid, phosphorus.  Prescription for allopurinol 100 mg daily is sent to his [facility].  This dose is reduced given his kidney function and other comorbidities.  We will utilize this in an attempt to prevent tumor lysis syndrome.  If his uric acid demonstrates elevation, I would not hesitate to utilize rasburicase.  He will be referred for chemotherapy teaching.  He will need a refresher course.  He has been taught on this medication in the past.  I have made contact with his primary care provider,  Dr. Nevada Crane, to update him with our plans for a medical oncology standpoint.  I recommended close follow-up from a primary care standpoint for his other medical issues given the fact that he will be restarted on therapy.  He will return in 3 weeks for follow-up.

## 2017-11-01 NOTE — Progress Notes (Signed)
Randall Reyes presents today for injection per the provider's orders.  B12 administration without incident; see MAR for injection details.  Patient tolerated procedure well and without incident.  No questions or complaints noted at this time.  Discharged ambulatory in c/o spouse.

## 2017-11-01 NOTE — Patient Instructions (Signed)
[place] at [facility] Discharge Instructions  Due to your elevated white blood cell count, worsening anemia, and low platelet count, it is time to reinitiate therapy.  Given your past history of therapy for your CLL, we will start a reduced dose of Imbruvica (ibrutinib).  We will slowly work up to that reduce dose as well.  We will work on Biochemist, clinical of your medication.  We will get you set up for chemotherapy teaching/review of this medication as well. We will anticipate starting within the next 2 weeks. Return weekly for laboratory work.   Thank you for choosing [place] at [facility] to provide your oncology and hematology care.  To afford each patient quality time with our provider, please arrive at least 15 minutes before your scheduled appointment time.   If you have a lab appointment with the [facility] please come in thru the  Main Entrance and check in at the main information desk  You need to re-schedule your appointment should you arrive 10 or more minutes late.  We strive to give you quality time with our providers, and arriving late affects you and other patients whose appointments are after yours.  Also, if you no show three or more times for appointments you may be dismissed from the clinic at the providers discretion.     Again, thank you for choosing [facility].  Our hope is that these requests will decrease the amount of time that you wait before being seen by our physicians.       _____________________________________________________________  Should you have questions after your visit to [facility], please contact our office at [phone] between the hours of 8:30 a.m. and 4:30 p.m.  Voicemails left after 4:30 p.m. will not be returned until the following business day.  For prescription refill requests, have your pharmacy contact our office.       Resources For Cancer Patients and  their Caregivers ? American [employer] Society: Can assist with transportation, wigs, general needs, runs Look Good Feel Better.        431-074-3324 ? Cancer Care: Provides financial assistance, online support groups, medication/co-pay assistance.  1-800-813-HOPE 2206466730) ? [facility] Assists [place] cancer patients and their families through emotional , educational and financial support.  3121398870 ? [place] DSS Where to apply for food stamps, Medicaid and utility assistance. (682) 804-1008 ? [facility]: Transportation to medical appointments. 684-506-9175 ? Social Security Administration: May apply for disability if have a Stage IV cancer. (415)180-0306 (408)094-5470 ? [employer], Disability and Transit Services: Assists with nutrition, care and transit needs. [place] Support Programs:   > Cancer Support Group  2nd Tuesday of the month 1pm-2pm, Journey Room   > Creative Journey  3rd Tuesday of the month 1130am-1pm, Journey Room

## 2017-11-01 NOTE — Telephone Encounter (Signed)
CRITICAL VALUE ALERT  Late entry:  Critical value received:WBC 296 Date of notification:  10-31-2017 Time of notification: 1200 Critical value read back:  Yes.   Nurse who received alert:  C. Page RN MD notified (1st page):  Kirby Crigler PA

## 2017-11-11 DIAGNOSIS — H53022 Refractive amblyopia, left eye: Secondary | ICD-10-CM | POA: Diagnosis not present

## 2017-11-11 DIAGNOSIS — H26493 Other secondary cataract, bilateral: Secondary | ICD-10-CM | POA: Diagnosis not present

## 2017-11-12 ENCOUNTER — Inpatient Hospital Stay (HOSPITAL_COMMUNITY): Payer: Medicare Other | Attending: Oncology

## 2017-11-12 DIAGNOSIS — I4892 Unspecified atrial flutter: Secondary | ICD-10-CM | POA: Insufficient documentation

## 2017-11-12 DIAGNOSIS — Z9221 Personal history of antineoplastic chemotherapy: Secondary | ICD-10-CM | POA: Insufficient documentation

## 2017-11-12 DIAGNOSIS — R634 Abnormal weight loss: Secondary | ICD-10-CM | POA: Insufficient documentation

## 2017-11-12 DIAGNOSIS — R61 Generalized hyperhidrosis: Secondary | ICD-10-CM | POA: Insufficient documentation

## 2017-11-12 DIAGNOSIS — R599 Enlarged lymph nodes, unspecified: Secondary | ICD-10-CM | POA: Insufficient documentation

## 2017-11-12 DIAGNOSIS — E119 Type 2 diabetes mellitus without complications: Secondary | ICD-10-CM | POA: Insufficient documentation

## 2017-11-12 DIAGNOSIS — Z87891 Personal history of nicotine dependence: Secondary | ICD-10-CM | POA: Insufficient documentation

## 2017-11-12 DIAGNOSIS — Z79899 Other long term (current) drug therapy: Secondary | ICD-10-CM | POA: Insufficient documentation

## 2017-11-12 DIAGNOSIS — C9110 Chronic lymphocytic leukemia of B-cell type not having achieved remission: Secondary | ICD-10-CM | POA: Insufficient documentation

## 2017-11-12 DIAGNOSIS — I10 Essential (primary) hypertension: Secondary | ICD-10-CM | POA: Insufficient documentation

## 2017-11-12 DIAGNOSIS — E875 Hyperkalemia: Secondary | ICD-10-CM | POA: Insufficient documentation

## 2017-11-12 DIAGNOSIS — Z85820 Personal history of malignant melanoma of skin: Secondary | ICD-10-CM | POA: Insufficient documentation

## 2017-11-12 DIAGNOSIS — E883 Tumor lysis syndrome: Secondary | ICD-10-CM | POA: Insufficient documentation

## 2017-11-12 DIAGNOSIS — Z950 Presence of cardiac pacemaker: Secondary | ICD-10-CM | POA: Insufficient documentation

## 2017-11-12 DIAGNOSIS — E669 Obesity, unspecified: Secondary | ICD-10-CM | POA: Insufficient documentation

## 2017-11-12 NOTE — Progress Notes (Signed)
Chemotherapy teaching completed via telephone. Side effects of Imbruvica explained and when to call the clinic.  Explained how to reach the clinic after hours.  Nurse navigator's number given.  Diarrhea, nausea and constipation sheets reviewed with patient.  Self care activities explained.  Importance of fevers reviewed.  All questions answered.  Patient aware of need for weekly lab work and will get copy of new schedule when he comes in this week for labs.

## 2017-11-12 NOTE — Patient Instructions (Addendum)
[facility] Chemotherapy Teaching   You have previously been diagnosed with CLL, Chronic Lymphocytic Leukemia.  We are starting you on Imbruvica (generic name: Ibrutinib).  You will take this medication daily.  You will start with 140 mg daily for one week and then increase to 280 mg daily.  You will have weekly lab work.  You will see the doctor regularly throughout treatment.  The doctor monitors your response to treatment by the way you are feeling, your blood work, and scans periodically.    Ibrutinib (Imbruvica)  About This Drug Ibrutinib is used to treat cancer and graft versus host disease (GVHD), a side effect from undergoing an allogeneic stem cell transplant. It is given orally (by mouth).  Possible Side Effects . Bone marrow depression. This is a decrease in the number of white blood cells, red blood cells, and platelets. This may raise your risk of infection, make you tired and weak (fatigue), and raise your risk of bleeding. . Abnormal bleeding - symptoms may be coughing up blood, throwing up blood (may look like coffee grounds), red or black tarry bowel movements, abnormally heavy menstrual flow, nosebleeds or any other unusual bleeding. . Bruising . Fever . Tiredness . Pneumonia . Nausea . Soreness of the mouth and throat. You may have red areas, white patches, or sores that hurt. . Loose bowel movements (diarrhea) . Bone, joint and muscle pain . Muscle spasms . Rash Note: Each of the side effects above was reported in 20% or greater of patients treated with ibrutinib. Not all possible side effects are included above.  Warnings and Precautions . Severe abnormal bleeding, which can be life-threatening . Severe bone marrow depression . Severe infections, including viral, bacterial and fungal, which can be life-threatening . Abnormal heart beats, which can be life-threatening . High blood pressure . This drug may raise your risk of getting a second  cancer, such as non-melanoma skin cancer . Tumor lysis syndrome: This drug may act on the cancer cells very quickly. This may affect how your kidneys work. Note: Some of the side effects above are very rare. If you have concerns and/or questions, please discuss them with your medical team.  Important Information . You may need to hold ibrutinib for 3 to 7 days prior to and after minor/major surgical procedures due to the risk of bleeding. Talk to your doctor and/or nurse for precautions you may need to take. Also, if you must have emergency surgery, tell the doctor that you are on ibrutinib.  How to Take Your Medication . Swallow the medicine whole with water at the same time each day. Do not open, break or chew the capsules. Do not cut, crush, or chew the tablets. . Missed dose: If you miss a dose, take it as soon as you think about it on that day. If you vomit a dose, take your next dose at the regular time, and contact your physician. Do not take 2 doses at the same time and do not double up on the next dose. [name] Handling: Wash your hands after handling your medicine, your caretakers should not handle your medicine with bare hands and should wear latex gloves. . This drug may be present in the saliva, tears, sweat, urine, stool, vomit, semen, and vaginal secretions. Talk to your doctor and/or your nurse about the necessary precautions to take during this time. . Storage: Store this medicine in the original container at room temperature. Discuss with your nurse or your doctor how  to dispose of unused medicine.  Treating Side Effects . Manage tiredness by pacing your activities for the day. . Be sure to include periods of rest between energy-draining activities. . To decrease infection, wash your hands regularly. . Avoid close contact with people who have a cold, the flu, or other infections. . Take your temperature as your doctor or nurse tells you, and whenever you feel like you may  have a fever. . To help decrease bleeding, use a soft toothbrush. Check with your nurse before using dental floss. . Be very careful when using knives or tools. . Use an electric shaver instead of a razor. . Drink plenty of fluids (a minimum of eight glasses per day is recommended). . If you throw up or have loose bowel movements, you should drink more fluids so that you do not become dehydrated (lack water in the body from losing too much fluid). . If you get diarrhea, eat low-fiber foods that are high in protein and calories and avoid foods that can irritate your digestive tracts or lead to cramping. . Ask your nurse or doctor about medicine that can lessen or stop your diarrhea. . To help with nausea and vomiting, eat small, frequent meals instead of three large meals a day Choose foods and drinks that are at room temperature. Ask your nurse or doctor about other helpful tips and medicine that is available to help or stop lessen these symptoms. . Mouth care is very important. Your mouth care should consist of routine, gentle cleaning of your teeth or dentures and rinsing your mouth with a mixture of 1/2 teaspoon of salt in 8 ounces of water or 1/2 teaspoon of baking soda in 8 ounces of water. This should be done at least after each meal and at bedtime. . If you have mouth sores, avoid mouthwash that has alcohol. Also avoid alcohol and smoking because they can bother your mouth and throat. [name] Keeping your pain under control is important to your well-being. Please tell your doctor or nurse if you are experiencing pain. . If you get a rash do not put anything on it unless your doctor or nurse says you may. Keep the area around the rash clean and dry. Ask your doctor for medicine if your rash bothers you.  Food and Drug Interactions . Do not eat grapefruit, [place] oranges or drink grapefruit juice while taking this medicine. Grapefruit, [place] oranges and grapefruit juice may raise the  levels of ibrutinib in your body. This could make side effects worse. . Check with your doctor or pharmacist about all other prescription medicines and over-the-counter medicines and dietary supplements (vitamins, minerals, herbs and others) you are taking before starting this medicine as there are known drug interactions with ibrutinib. Also, check with your doctor or pharmacist before starting any new prescription or over-the-counter medicines, or dietary supplements to make sure that there are no interactions. . Avoid the use of St. John's Wort while taking ibrutinib as this may lower the levels of the drug in your body, which can make it less effective.  When to Call the Doctor Call your doctor or nurse if you have any of these symptoms and/or any new or unusual symptoms: . Fever of 100.4 F (38 C) or higher . Chills . A headache that does not go away . Blurry vision or other changes in eyesight . Fatigue that interferes with your daily activities . Feeling dizzy or lightheaded . Coughing up blood . Easy bleeding or bruising .  Feeling that your heart is beating in a fast or not normal way (palpitations) . Loose bowel movements (diarrhea) 4 times a day or loose bowel movements with lack of strength or a feeling of being dizzy . Nausea that stops you from eating or drinking and/or is not relieved by prescribed medicines . Throwing up more than 3 times a day . Pain in your mouth or throat that makes it hard to eat or drink . Blood in your urine, vomit (bright red or coffee-ground) and/or stools ( bright red, or black/tarry) . Pain that does not go away or is not relieved by prescribed medicine . New rash and/or itching . Rash that is not relieved by prescribed medicines . Signs of tumor lysis: Confusion or agitation, decreased urine, nausea/vomiting, diarrhea, muscle cramping, numbness and/or tingling, seizures. . If you think you may be pregnant or may have impregnated your  partner  Reproduction Warnings . Pregnancy warning: This drug can have harmful effects on the unborn baby. Women of childbearing potential should use effective methods of birth control during your cancer treatment and for 1 month after treatment. Men with male partners of child bearing potential should use effective methods of birth control during your cancer treatment and for 1 month after your cancer treatment. Let your doctor know right away if you think you may be pregnant or may have impregnated your partner . Breastfeeding warning: It is not known if this drug passes into breast milk. For this reason, women should talk to their doctor about the risks and benefits of breastfeeding during treatment with this drug because this drug may enter the breast milk and cause harm to a breastfeeding baby. . Fertility warning: Human fertility studies have not been done with this drug. Talk with your doctor or nurse if you plan on having children. Ask for information on sperm or egg banking.   SELF CARE ACTIVITIES WHILE ON CHEMOTHERAPY:  Hydration Increase your fluid intake 48 hours prior to treatment and drink at least 8 to 12 cups (64 ounces) of water/decaff beverages per day after treatment. You can still have your cup of coffee or soda but these beverages do not count as part of your 8 to 12 cups that you need to drink daily. No alcohol intake.  Medications Continue taking your normal prescription medication as prescribed.  If you start any new herbal or new supplements please let us know first to make sure it is safe.  Mouth Care Have teeth cleaned professionally before starting treatment. Keep dentures and partial plates clean. Use soft toothbrush and do not use mouthwashes that contain alcohol. Biotene is a good mouthwash that is available at most pharmacies or may be ordered by calling [phone]. Use warm salt water gargles (1 teaspoon salt per 1 quart warm water) before and after  meals and at bedtime. Or you may rinse with 2 tablespoons of three-percent hydrogen peroxide mixed in eight ounces of water. If you are still having problems with your mouth or sores in your mouth please call the clinic. If you need dental work, please let the doctor know before you go for your appointment so that we can coordinate the best possible time for you in regards to your chemo regimen. You need to also let your dentist know that you are actively taking chemo. We may need to do labs prior to your dental appointment.  Skin Care Always use sunscreen that has not expired and with SPF (Sun Protection Factor) of 50 or  higher. Wear hats to protect your head from the sun. Remember to use sunscreen on your hands, ears, face, & feet.  Use good moisturizing lotions such as udder cream, eucerin, or even Vaseline. Some chemotherapies can cause dry skin, color changes in your skin and nails.    . Avoid long, hot showers or baths. . Use gentle, fragrance-free soaps and laundry detergent. . Use moisturizers, preferably creams or ointments rather than lotions because the thicker consistency is better at preventing skin dehydration. Apply the cream or ointment within 15 minutes of showering. Reapply moisturizer at night, and moisturize your hands every time after you wash them.  Hair Loss (if your doctor says your hair will fall out)  . If your doctor says that your hair is likely to fall out, decide before you begin chemo whether you want to wear a wig. You may want to shop before treatment to match your hair color. . Hats, turbans, and scarves can also camouflage hair loss, although some people prefer to leave their heads uncovered. If you go bare-headed outdoors, be sure to use sunscreen on your scalp. . Cut your hair short. It eases the inconvenience of shedding lots of hair, but it also can reduce the emotional impact of watching your hair fall out. . Don't perm or color your hair during chemotherapy.  Those chemical treatments are already damaging to hair and can enhance hair loss. Once your chemo treatments are done and your hair has grown back, it's OK to resume dyeing or perming hair. With chemotherapy, hair loss is almost always temporary. But when it grows back, it may be a different color or texture. In older adults who still had hair color before chemotherapy, the new growth may be completely gray.  Often, new hair is very fine and soft.  Infection Prevention  Please wash your hands for at least 30 seconds using warm soapy water. Handwashing is the #1 way to prevent the spread of germs. Stay away from sick people or people who are getting over a cold. If you develop respiratory systems such as green/yellow mucus production or productive cough or persistent cough let us know and we will see if you need an antibiotic. It is a good idea to keep a pair of gloves on when going into grocery stores/[employer] to decrease your risk of coming into contact with germs on the carts, etc. Carry alcohol hand gel with you at all times and use it frequently if out in public. If your temperature reaches 100.5 or higher please call the clinic and let us know.  If it is after hours or on the weekend please go to the ER if your temperature is over 100.5.  Please have your own personal thermometer at home to use.    Sex and bodily fluids  If you are going to have sex, a condom must be used to protect the person that isn't taking chemotherapy. Chemo can decrease your libido (sex drive). For a few days after chemotherapy, chemotherapy can be excreted through your bodily fluids.  When using the toilet please close the lid and flush the toilet twice.  Do this for a few day after you have had chemotherapy.   Effects of chemotherapy on your sex life Some changes are simple and won't last long. They won't affect your sex life permanently. Sometimes you may feel: . too tired . not strong enough to be very active . sick or  sore  . not in the mood . anxious  or low Your anxiety might not seem related to sex. For example, you may be worried about the cancer and how your treatment is going. Or you may be worried about money, or about how you family are coping with your illness. These things can cause stress, which can affect your interest in sex. It's important to talk to your partner about how you feel. Remember - the changes to your sex life don't usually last long. There's usually no medical reason to stop having sex during chemo. The drugs won't have any long term physical effects on your performance or enjoyment of sex. Cancer can't be passed on to your partner during sex  Contraception It's important to use reliable contraception during treatment. Avoid getting pregnant while you or your partner are having chemotherapy. This is because the drugs may harm the baby. Sometimes chemotherapy drugs can leave a man or woman infertile.  This means you would not be able to have children in the future. You might want to talk to someone about permanent infertility. It can be very difficult to learn that you may no longer be able to have children. Some people find counselling helpful. There might be ways to preserve your fertility, although this is easier for men than for women. You may want to speak to a fertility expert. You can talk about sperm banking or harvesting your eggs. You can also ask about other fertility options, such as donor eggs. If you have or have had breast cancer, your doctor might advise you not to take the contraceptive pill. This is because the hormones in it might affect the cancer.  It is not known for sure whether or not chemotherapy drugs can be passed on through semen or secretions from the vagina. Because of this some doctors advise people to use a barrier method if you have sex during treatment. This applies to vaginal, anal or oral sex. Generally, doctors advise a barrier method only for the time you  are actually having the treatment and for about a week after your treatment. Advice like this can be worrying, but this does not mean that you have to avoid being intimate with your partner. You can still have close contact with your partner and continue to enjoy sex.  Animals If you have cats or birds we just ask that you not change the litter or change the cage.  Please have someone else do this for you while you are on chemotherapy.   Food Safety During and After Cancer Treatment Food safety is important for people both during and after cancer treatment. Cancer and cancer treatments, such as chemotherapy, radiation therapy, and stem cell/bone marrow transplantation, often weaken the immune system. This makes it harder for your body to protect itself from foodborne illness, also called food poisoning. Foodborne illness is caused by eating food that contains harmful bacteria, parasites, or viruses.  Foods to avoid Some foods have a higher risk of becoming tainted with bacteria. These include: [name] Unwashed fresh fruit and vegetables, especially leafy vegetables that can hide dirt and other contaminants . Raw sprouts, such as alfalfa sprouts . Raw or undercooked beef, especially ground beef, or other raw or undercooked meat and poultry . Fatty, fried, or spicy foods immediately before or after treatment.  These can sit heavy on your stomach and make you feel nauseous. . Raw or undercooked shellfish, such as oysters. . Sushi and sashimi, which often contain raw fish.  . Unpasteurized beverages, such as unpasteurized fruit juices, raw  milk, raw yogurt, or cider . Undercooked eggs, such as soft boiled, over easy, and poached; raw, unpasteurized eggs; or foods made with raw egg, such as homemade raw cookie dough and homemade mayonnaise Simple steps for food safety Shop smart. . Do not buy food stored or displayed in an unclean area. . Do not buy bruised or damaged fruits or vegetables. . Do not buy  cans that have cracks, dents, or bulges. . Pick up foods that can spoil at the end of your shopping trip and store them in a cooler on the way home. Prepare and clean up foods carefully. . Rinse all fresh fruits and vegetables under running water, and dry them with a clean towel or paper towel. . Clean the top of cans before opening them. . After preparing food, wash your hands for 20 seconds with hot water and soap. Pay special attention to areas between fingers and under nails. . Clean your utensils and dishes with hot water and soap. [name] Disinfect your kitchen and cutting boards using 1 teaspoon of liquid, unscented bleach mixed into 1 quart of water.   Dispose of old food. . Eat canned and packaged food before its expiration date (the "use by" or "best before" date). . Consume refrigerated leftovers within 3 to 4 days. After that time, throw out the food. Even if the food does not smell or look spoiled, it still may be unsafe. Some bacteria, such as Listeria, can grow even on foods stored in the refrigerator if they are kept for too long. Take precautions when eating out. . At restaurants, avoid buffets and salad bars where food sits out for a long time and comes in contact with many people. Food can become contaminated when someone with a virus, often a norovirus, or another "bug" handles it. . Put any leftover food in a "to-go" container yourself, rather than having the server do it. And, refrigerate leftovers as soon as you get home. . Choose restaurants that are clean and that are willing to prepare your food as you order it cooked.    MEDICATIONS: Compazine/Prochlorperazine 10mg  tablet. Take 1 tablet every 6 hours as needed for nausea/vomiting. (#2 nausea med to take, this can make you sleepy)   Over-the-Counter Meds: Miralax 1 capful in 8 oz of fluid daily. May increase to two times a day if needed. This is a stool softener. If this doesn't work proceed you can add:  Senokot S-start  with 1 tablet two times a day and increase to 4 tablets two times a day if needed. (total of 8 tablets in a 24 hour period). This is a stimulant laxative.   Call us if this does not help your bowels move.   Imodium 2mg  capsule. Take 2 capsules after the 1st loose stool and then 1 capsule every 2 hours until you go a total of 12 hours without having a loose stool. Call the [facility] if loose stools continue. If diarrhea occurs @ bedtime, take 2 capsules @ bedtime. Then take 2 capsules every 4 hours until morning. Call [facility].    Diarrhea Sheet  If you are having loose stools/diarrhea, please purchase Imodium and begin taking as outlined:  At the first sign of poorly formed or loose stools you should begin taking Imodium(loperamide) 2 mg capsules.  Take two caplets (4mg ) followed by one caplet (2mg ) every 2 hours until you have had no diarrhea for 12 hours.  During the night take two caplets (4mg ) at bedtime and  continue every 4 hours during the night until the morning.  Stop taking Imodium only after there is no sign of diarrhea for 12 hours.    Always call the [facility] if you are having loose stools/diarrhea that you can't get under control.  Loose stools/diarrhea leads to dehydration (loss of water) in your body.  We have other options of trying to get the loose stools/diarrhea to stopped but you must let us know!    Nausea Sheet   Compazine/Prochlorperazine 10mg  tablet. Take 1 tablet every 6 hours as needed for nausea/vomiting. (#2 nausea med to take, this can make you sleepy)  You can take these medications together or separately.  We would first like for you to try the Ondansetron by itself and then take the Prochloperizine if needed. But you are allowed to take both medications at the same time if your nausea is that severe.  If you are having persistent nausea (nausea that does not stop) please take these medications on a staggered schedule so that the nausea medication stays  in your body.  Please call the [facility] and let us know the amount of nausea that you are experiencing.  If you begin to vomit, you need to call the [facility] and if it is the weekend and you have vomited more than one time and cant get it to stop-go to the Emergency Room.  Persistent nausea/vomiting can lead to dehydration (loss of fluid in your body) and will make you feel terrible.   Ice chips, sips of clear liquids, foods that are @ room temperature, crackers, and toast tend to be better tolerated.     Constipation Sheet  *Miralax in 8 oz of fluid daily.  May increase to two times a day if needed.  This is a stool softener.  If this not enough to keep your bowel regular:  You can add:  *Senokot S, start with one tablet twice a day and can increase to 4 tablets twice a day if needed.  This is a stimulant laxative.   Sometimes when you take pain medication you need BOTH a medicine to keep your stool soft and a medicine to help your bowel push it out!  Please call if the above does not work for you.   Do not go more than 2 days without a bowel movement.  It is very important that you do not become constipated.  It will make you feel sick to your stomach (nausea) and can cause abdominal pain and vomiting.    SYMPTOMS TO REPORT AS SOON AS POSSIBLE AFTER TREATMENT:  FEVER GREATER THAN 100.5 F  CHILLS WITH OR WITHOUT FEVER  NAUSEA AND VOMITING THAT IS NOT CONTROLLED WITH YOUR NAUSEA MEDICATION  UNUSUAL SHORTNESS OF BREATH  UNUSUAL BRUISING OR BLEEDING  TENDERNESS IN MOUTH AND THROAT WITH OR WITHOUT PRESENCE OF ULCERS  URINARY PROBLEMS  BOWEL PROBLEMS  UNUSUAL RASH    Wear comfortable clothing and clothing appropriate for easy access to any Portacath or PICC line. Let us know if there is anything that we can do to make your therapy better!    What to do if you need assistance after hours or on the weekends: CALL [phone].  HOLD on the line, do not hang up.   You will hear multiple messages but at the end you will be connected with a nurse triage line.  They will contact the doctor if necessary.  Most of the time they will be able to assist you.  Do not call the hospital operator.      I have been informed and understand all of the instructions given to me and have received a copy. I have been instructed to call the clinic [phone] or my family physician as soon as possible for continued medical care, if indicated. I do not have any more questions at this time but understand that I may call the [facility] or the Patient Navigator at [phone] during office hours should I have questions or need assistance in obtaining follow-up care.

## 2017-11-13 ENCOUNTER — Other Ambulatory Visit (HOSPITAL_COMMUNITY): Payer: Self-pay | Admitting: Hematology

## 2017-11-15 ENCOUNTER — Inpatient Hospital Stay (HOSPITAL_COMMUNITY): Payer: Medicare Other

## 2017-11-15 DIAGNOSIS — E669 Obesity, unspecified: Secondary | ICD-10-CM | POA: Diagnosis not present

## 2017-11-15 DIAGNOSIS — E875 Hyperkalemia: Secondary | ICD-10-CM | POA: Diagnosis not present

## 2017-11-15 DIAGNOSIS — C9110 Chronic lymphocytic leukemia of B-cell type not having achieved remission: Secondary | ICD-10-CM

## 2017-11-15 DIAGNOSIS — R599 Enlarged lymph nodes, unspecified: Secondary | ICD-10-CM | POA: Diagnosis not present

## 2017-11-15 DIAGNOSIS — Z85820 Personal history of malignant melanoma of skin: Secondary | ICD-10-CM | POA: Diagnosis not present

## 2017-11-15 DIAGNOSIS — R61 Generalized hyperhidrosis: Secondary | ICD-10-CM | POA: Diagnosis not present

## 2017-11-15 DIAGNOSIS — R634 Abnormal weight loss: Secondary | ICD-10-CM | POA: Diagnosis not present

## 2017-11-15 DIAGNOSIS — E119 Type 2 diabetes mellitus without complications: Secondary | ICD-10-CM | POA: Diagnosis not present

## 2017-11-15 DIAGNOSIS — Z6822 Body mass index (BMI) 22.0-22.9, adult: Secondary | ICD-10-CM | POA: Diagnosis not present

## 2017-11-15 DIAGNOSIS — Z950 Presence of cardiac pacemaker: Secondary | ICD-10-CM | POA: Diagnosis not present

## 2017-11-15 DIAGNOSIS — Z9221 Personal history of antineoplastic chemotherapy: Secondary | ICD-10-CM | POA: Diagnosis not present

## 2017-11-15 DIAGNOSIS — I4892 Unspecified atrial flutter: Secondary | ICD-10-CM | POA: Diagnosis not present

## 2017-11-15 DIAGNOSIS — J181 Lobar pneumonia, unspecified organism: Secondary | ICD-10-CM | POA: Diagnosis not present

## 2017-11-15 DIAGNOSIS — E883 Tumor lysis syndrome: Secondary | ICD-10-CM | POA: Diagnosis not present

## 2017-11-15 DIAGNOSIS — I10 Essential (primary) hypertension: Secondary | ICD-10-CM | POA: Diagnosis not present

## 2017-11-15 DIAGNOSIS — Z87891 Personal history of nicotine dependence: Secondary | ICD-10-CM | POA: Diagnosis not present

## 2017-11-15 DIAGNOSIS — R05 Cough: Secondary | ICD-10-CM | POA: Diagnosis not present

## 2017-11-15 DIAGNOSIS — Z79899 Other long term (current) drug therapy: Secondary | ICD-10-CM | POA: Diagnosis not present

## 2017-11-15 LAB — COMPREHENSIVE METABOLIC PANEL
ALBUMIN: 2.8 g/dL — AB (ref 3.5–5.0)
ALT: 18 U/L (ref 0–44)
ANION GAP: 10 (ref 5–15)
AST: 26 U/L (ref 15–41)
Alkaline Phosphatase: 146 U/L — ABNORMAL HIGH (ref 38–126)
BUN: 35 mg/dL — ABNORMAL HIGH (ref 8–23)
CALCIUM: 8.5 mg/dL — AB (ref 8.9–10.3)
CO2: 24 mmol/L (ref 22–32)
Chloride: 103 mmol/L (ref 98–111)
Creatinine, Ser: 2.04 mg/dL — ABNORMAL HIGH (ref 0.61–1.24)
GFR calc Af Amer: 34 mL/min — ABNORMAL LOW (ref >60)
GFR calc non Af Amer: 29 mL/min — ABNORMAL LOW (ref >60)
GLUCOSE: 184 mg/dL — AB (ref 70–99)
POTASSIUM: 5.4 mmol/L — AB (ref 3.5–5.1)
SODIUM: 137 mmol/L (ref 135–145)
TOTAL PROTEIN: 7.1 g/dL (ref 6.5–8.1)
Total Bilirubin: 0.2 mg/dL — ABNORMAL LOW (ref 0.3–1.2)

## 2017-11-15 LAB — CBC WITH DIFFERENTIAL/PLATELET
BAND NEUTROPHILS: 0 %
BASOS ABS: 0 10*3/uL (ref 0.0–0.1)
BLASTS: 0 %
Basophils Relative: 0 %
EOS ABS: 0 10*3/uL (ref 0.0–0.7)
Eosinophils Relative: 0 %
HCT: 24 % — ABNORMAL LOW (ref 39.0–52.0)
HEMOGLOBIN: 7.6 g/dL — AB (ref 13.0–17.0)
Lymphocytes Relative: 65 %
Lymphs Abs: 217.7 10*3/uL — ABNORMAL HIGH (ref 0.7–4.0)
MCH: 35.7 pg — ABNORMAL HIGH (ref 26.0–34.0)
MCHC: 31.7 g/dL (ref 30.0–36.0)
MCV: 112.7 fL — ABNORMAL HIGH (ref 78.0–100.0)
METAMYELOCYTES PCT: 4 %
MYELOCYTES: 4 %
Monocytes Absolute: 60.3 10*3/uL — ABNORMAL HIGH (ref 0.1–1.0)
Monocytes Relative: 18 %
NEUTROS ABS: 56.9 10*3/uL — AB (ref 1.7–7.7)
Neutrophils Relative %: 4 %
Other: 0 %
PROMYELOCYTES RELATIVE: 5 %
Platelets: 68 10*3/uL — ABNORMAL LOW (ref 150–400)
RBC: 2.13 MIL/uL — ABNORMAL LOW (ref 4.22–5.81)
RDW: 25.1 % — ABNORMAL HIGH (ref 11.5–15.5)
WBC: 334.9 10*3/uL — AB (ref 4.0–10.5)
nRBC: 0 /100 WBC

## 2017-11-15 LAB — LACTATE DEHYDROGENASE: LDH: 218 U/L — ABNORMAL HIGH (ref 98–192)

## 2017-11-15 LAB — URIC ACID: URIC ACID, SERUM: 3.6 mg/dL — AB (ref 3.7–8.6)

## 2017-11-15 LAB — PHOSPHORUS: PHOSPHORUS: 3 mg/dL (ref 2.5–4.6)

## 2017-11-15 NOTE — Progress Notes (Unsigned)
CRITICAL VALUE ALERT Critical value received:  WBC 334.9 Date of notification:  11-15-2017 Time of notification: 1500 Critical value read back:  Yes.   Nurse who received alert:  C. Lakita Sahlin RN MD notified (1st [place]):  Dr. Delton Coombes.

## 2017-11-16 ENCOUNTER — Ambulatory Visit (HOSPITAL_COMMUNITY)
Admission: RE | Admit: 2017-11-16 | Discharge: 2017-11-16 | Disposition: A | Discharge status: Home / Self Care | Payer: Medicare Other | Source: Ambulatory Visit | Attending: Internal Medicine | Admitting: Internal Medicine

## 2017-11-16 ENCOUNTER — Other Ambulatory Visit (HOSPITAL_COMMUNITY): Payer: Self-pay | Admitting: Internal Medicine

## 2017-11-16 DIAGNOSIS — R059 Cough, unspecified: Secondary | ICD-10-CM

## 2017-11-16 DIAGNOSIS — J439 Emphysema, unspecified: Secondary | ICD-10-CM | POA: Diagnosis not present

## 2017-11-16 DIAGNOSIS — J181 Lobar pneumonia, unspecified organism: Secondary | ICD-10-CM | POA: Diagnosis not present

## 2017-11-16 DIAGNOSIS — R05 Cough: Secondary | ICD-10-CM

## 2017-11-16 DIAGNOSIS — I700 Atherosclerosis of aorta: Secondary | ICD-10-CM | POA: Insufficient documentation

## 2017-11-18 ENCOUNTER — Other Ambulatory Visit (HOSPITAL_COMMUNITY): Payer: Self-pay | Admitting: Pharmacist

## 2017-11-18 ENCOUNTER — Ambulatory Visit (HOSPITAL_COMMUNITY): Payer: Medicare Other

## 2017-11-19 ENCOUNTER — Ambulatory Visit (INDEPENDENT_AMBULATORY_CARE_PROVIDER_SITE_OTHER): Payer: Medicare Other | Admitting: *Deleted

## 2017-11-19 ENCOUNTER — Ambulatory Visit (HOSPITAL_COMMUNITY): Payer: Medicare Other

## 2017-11-19 DIAGNOSIS — R55 Syncope and collapse: Secondary | ICD-10-CM

## 2017-11-19 DIAGNOSIS — R001 Bradycardia, unspecified: Secondary | ICD-10-CM

## 2017-11-19 LAB — PATHOLOGIST SMEAR REVIEW

## 2017-11-19 NOTE — Progress Notes (Signed)
Remote pacemaker transmission.   

## 2017-11-20 ENCOUNTER — Inpatient Hospital Stay (HOSPITAL_COMMUNITY): Payer: Medicare Other

## 2017-11-20 ENCOUNTER — Encounter (HOSPITAL_COMMUNITY): Payer: Self-pay

## 2017-11-22 ENCOUNTER — Other Ambulatory Visit (HOSPITAL_COMMUNITY): Payer: Medicare Other

## 2017-11-22 ENCOUNTER — Inpatient Hospital Stay (HOSPITAL_BASED_OUTPATIENT_CLINIC_OR_DEPARTMENT_OTHER): Payer: Medicare Other | Admitting: Hematology

## 2017-11-22 ENCOUNTER — Inpatient Hospital Stay (HOSPITAL_COMMUNITY): Payer: Medicare Other

## 2017-11-22 ENCOUNTER — Encounter (HOSPITAL_COMMUNITY): Payer: Self-pay | Admitting: *Deleted

## 2017-11-22 ENCOUNTER — Encounter (HOSPITAL_COMMUNITY): Payer: Self-pay

## 2017-11-22 ENCOUNTER — Inpatient Hospital Stay (HOSPITAL_COMMUNITY)
Admission: AD | Admit: 2017-11-22 | Discharge: 2017-11-25 | DRG: 683 | Disposition: A | Discharge status: Home / Self Care | Payer: Medicare Other | Source: Ambulatory Visit | Attending: Internal Medicine | Admitting: Internal Medicine

## 2017-11-22 ENCOUNTER — Encounter (HOSPITAL_COMMUNITY): Payer: Self-pay | Admitting: Hematology

## 2017-11-22 VITALS — BP 104/63 | HR 77 | Temp 97.8°F | Resp 18 | Wt 115.0 lb

## 2017-11-22 DIAGNOSIS — I4892 Unspecified atrial flutter: Secondary | ICD-10-CM | POA: Diagnosis not present

## 2017-11-22 DIAGNOSIS — Z9842 Cataract extraction status, left eye: Secondary | ICD-10-CM

## 2017-11-22 DIAGNOSIS — Y92230 Patient room in hospital as the place of occurrence of the external cause: Secondary | ICD-10-CM | POA: Diagnosis present

## 2017-11-22 DIAGNOSIS — Z79899 Other long term (current) drug therapy: Secondary | ICD-10-CM | POA: Diagnosis not present

## 2017-11-22 DIAGNOSIS — Z7984 Long term (current) use of oral hypoglycemic drugs: Secondary | ICD-10-CM | POA: Diagnosis not present

## 2017-11-22 DIAGNOSIS — E669 Obesity, unspecified: Secondary | ICD-10-CM | POA: Diagnosis not present

## 2017-11-22 DIAGNOSIS — Z888 Allergy status to other drugs, medicaments and biological substances status: Secondary | ICD-10-CM | POA: Diagnosis not present

## 2017-11-22 DIAGNOSIS — D630 Anemia in neoplastic disease: Secondary | ICD-10-CM | POA: Diagnosis present

## 2017-11-22 DIAGNOSIS — Z886 Allergy status to analgesic agent status: Secondary | ICD-10-CM | POA: Diagnosis not present

## 2017-11-22 DIAGNOSIS — Z833 Family history of diabetes mellitus: Secondary | ICD-10-CM | POA: Diagnosis not present

## 2017-11-22 DIAGNOSIS — E875 Hyperkalemia: Secondary | ICD-10-CM | POA: Diagnosis not present

## 2017-11-22 DIAGNOSIS — I129 Hypertensive chronic kidney disease with stage 1 through stage 4 chronic kidney disease, or unspecified chronic kidney disease: Secondary | ICD-10-CM | POA: Diagnosis present

## 2017-11-22 DIAGNOSIS — C9110 Chronic lymphocytic leukemia of B-cell type not having achieved remission: Secondary | ICD-10-CM | POA: Diagnosis not present

## 2017-11-22 DIAGNOSIS — E538 Deficiency of other specified B group vitamins: Secondary | ICD-10-CM

## 2017-11-22 DIAGNOSIS — E883 Tumor lysis syndrome: Secondary | ICD-10-CM | POA: Diagnosis not present

## 2017-11-22 DIAGNOSIS — I10 Essential (primary) hypertension: Secondary | ICD-10-CM | POA: Diagnosis present

## 2017-11-22 DIAGNOSIS — Z85820 Personal history of malignant melanoma of skin: Secondary | ICD-10-CM

## 2017-11-22 DIAGNOSIS — E119 Type 2 diabetes mellitus without complications: Secondary | ICD-10-CM

## 2017-11-22 DIAGNOSIS — N183 Chronic kidney disease, stage 3 unspecified: Secondary | ICD-10-CM | POA: Diagnosis present

## 2017-11-22 DIAGNOSIS — T451X5A Adverse effect of antineoplastic and immunosuppressive drugs, initial encounter: Secondary | ICD-10-CM | POA: Diagnosis not present

## 2017-11-22 DIAGNOSIS — R599 Enlarged lymph nodes, unspecified: Secondary | ICD-10-CM | POA: Diagnosis not present

## 2017-11-22 DIAGNOSIS — D039 Melanoma in situ, unspecified: Secondary | ICD-10-CM | POA: Diagnosis present

## 2017-11-22 DIAGNOSIS — R634 Abnormal weight loss: Secondary | ICD-10-CM

## 2017-11-22 DIAGNOSIS — Z87891 Personal history of nicotine dependence: Secondary | ICD-10-CM

## 2017-11-22 DIAGNOSIS — Z9221 Personal history of antineoplastic chemotherapy: Secondary | ICD-10-CM

## 2017-11-22 DIAGNOSIS — Z961 Presence of intraocular lens: Secondary | ICD-10-CM | POA: Diagnosis present

## 2017-11-22 DIAGNOSIS — C9190 Lymphoid leukemia, unspecified not having achieved remission: Secondary | ICD-10-CM | POA: Diagnosis not present

## 2017-11-22 DIAGNOSIS — R61 Generalized hyperhidrosis: Secondary | ICD-10-CM

## 2017-11-22 DIAGNOSIS — Z950 Presence of cardiac pacemaker: Secondary | ICD-10-CM

## 2017-11-22 DIAGNOSIS — R569 Unspecified convulsions: Secondary | ICD-10-CM | POA: Diagnosis present

## 2017-11-22 DIAGNOSIS — E440 Moderate protein-calorie malnutrition: Secondary | ICD-10-CM | POA: Diagnosis not present

## 2017-11-22 DIAGNOSIS — E1122 Type 2 diabetes mellitus with diabetic chronic kidney disease: Secondary | ICD-10-CM | POA: Diagnosis not present

## 2017-11-22 DIAGNOSIS — Z9841 Cataract extraction status, right eye: Secondary | ICD-10-CM

## 2017-11-22 LAB — CBC WITH DIFFERENTIAL/PLATELET
Band Neutrophils: 0 %
Basophils Absolute: 0 10*3/uL (ref 0.0–0.1)
Basophils Relative: 0 %
Blasts: 4 %
Eosinophils Absolute: 0 10*3/uL (ref 0.0–0.7)
Eosinophils Relative: 0 %
HCT: 22.6 % — ABNORMAL LOW (ref 39.0–52.0)
Hemoglobin: 7 g/dL — ABNORMAL LOW (ref 13.0–17.0)
Lymphocytes Relative: 92 %
Lymphs Abs: 248.9 10*3/uL — ABNORMAL HIGH (ref 0.7–4.0)
MCH: 35.5 pg — ABNORMAL HIGH (ref 26.0–34.0)
MCHC: 31 g/dL (ref 30.0–36.0)
MCV: 114.7 fL — ABNORMAL HIGH (ref 78.0–100.0)
Metamyelocytes Relative: 0 %
Monocytes Absolute: 2.7 10*3/uL — ABNORMAL HIGH (ref 0.1–1.0)
Monocytes Relative: 1 %
Myelocytes: 0 %
Neutro Abs: 8.1 10*3/uL — ABNORMAL HIGH (ref 1.7–7.7)
Neutrophils Relative %: 3 %
Other: 0 %
Platelets: 58 10*3/uL — ABNORMAL LOW (ref 150–400)
Promyelocytes Relative: 0 %
RBC: 1.97 MIL/uL — ABNORMAL LOW (ref 4.22–5.81)
RDW: 24.6 % — ABNORMAL HIGH (ref 11.5–15.5)
WBC: 270.5 10*3/uL (ref 4.0–10.5)
nRBC: 0 /100 WBC

## 2017-11-22 LAB — COMPREHENSIVE METABOLIC PANEL
ALT: 29 U/L (ref 0–44)
AST: 36 U/L (ref 15–41)
Albumin: 2.5 g/dL — ABNORMAL LOW (ref 3.5–5.0)
Alkaline Phosphatase: 159 U/L — ABNORMAL HIGH (ref 38–126)
Anion gap: 11 (ref 5–15)
BUN: 36 mg/dL — AB (ref 8–23)
CHLORIDE: 99 mmol/L (ref 98–111)
CO2: 25 mmol/L (ref 22–32)
Calcium: 8.6 mg/dL — ABNORMAL LOW (ref 8.9–10.3)
Creatinine, Ser: 2.08 mg/dL — ABNORMAL HIGH (ref 0.61–1.24)
GFR calc Af Amer: 33 mL/min — ABNORMAL LOW (ref >60)
GFR, EST NON AFRICAN AMERICAN: 28 mL/min — AB (ref >60)
Glucose, Bld: 196 mg/dL — ABNORMAL HIGH (ref 70–99)
POTASSIUM: 4.3 mmol/L (ref 3.5–5.1)
Sodium: 135 mmol/L (ref 135–145)
Total Bilirubin: 0.5 mg/dL (ref 0.3–1.2)
Total Protein: 7.1 g/dL (ref 6.5–8.1)

## 2017-11-22 LAB — CBC
HCT: 17.9 % — ABNORMAL LOW (ref 39.0–52.0)
Hemoglobin: 5.7 g/dL — CL (ref 13.0–17.0)
MCH: 35.8 pg — AB (ref 26.0–34.0)
MCHC: 31.8 g/dL (ref 30.0–36.0)
MCV: 112.6 fL — ABNORMAL HIGH (ref 78.0–100.0)
PLATELETS: 48 10*3/uL — AB (ref 150–400)
RBC: 1.59 MIL/uL — ABNORMAL LOW (ref 4.22–5.81)
RDW: 24.3 % — ABNORMAL HIGH (ref 11.5–15.5)
WBC: 281.6 10*3/uL (ref 4.0–10.5)

## 2017-11-22 LAB — PHOSPHORUS: PHOSPHORUS: 2.9 mg/dL (ref 2.5–4.6)

## 2017-11-22 LAB — PREPARE RBC (CROSSMATCH)

## 2017-11-22 LAB — LACTATE DEHYDROGENASE: LDH: 221 U/L — AB (ref 98–192)

## 2017-11-22 LAB — URIC ACID: Uric Acid, Serum: 4 mg/dL (ref 3.7–8.6)

## 2017-11-22 MED ORDER — ACETAMINOPHEN 325 MG PO TABS
650.0000 mg | ORAL_TABLET | Freq: Four times a day (QID) | ORAL | Status: DC | PRN
  Administered 2017-11-22: 650 mg via ORAL
  Filled 2017-11-22: qty 2

## 2017-11-22 MED ORDER — SODIUM CHLORIDE 0.9 % IV SOLN: 250.0000 mL | Freq: Once | INTRAVENOUS | Status: DC

## 2017-11-22 MED ORDER — IBRUTINIB 140 MG PO CAPS: 280.0000 mg | ORAL_CAPSULE | Freq: Every day | ORAL | Status: DC

## 2017-11-22 MED ORDER — ONDANSETRON HCL 4 MG PO TABS: 4.0000 mg | ORAL_TABLET | Freq: Four times a day (QID) | ORAL | Status: DC | PRN

## 2017-11-22 MED ORDER — ALLOPURINOL 300 MG PO TABS
300.0000 mg | ORAL_TABLET | Freq: Every day | ORAL | Status: DC
  Administered 2017-11-22 – 2017-11-25 (×4): 300 mg via ORAL
  Filled 2017-11-22 (×4): qty 1

## 2017-11-22 MED ORDER — LEVETIRACETAM 100 MG/ML PO SOLN
750.0000 mg | Freq: Two times a day (BID) | ORAL | Status: DC
  Administered 2017-11-22 – 2017-11-25 (×6): 750 mg via ORAL
  Filled 2017-11-22 (×10): qty 7.5

## 2017-11-22 MED ORDER — SODIUM CHLORIDE 0.9% IV SOLUTION
Freq: Once | INTRAVENOUS | Status: AC
  Administered 2017-11-22: 21:00:00 via INTRAVENOUS

## 2017-11-22 MED ORDER — SODIUM CHLORIDE 0.9 % IV SOLN
INTRAVENOUS | Status: DC
  Administered 2017-11-22: 500 mL via INTRAVENOUS

## 2017-11-22 MED ORDER — MAGIC MOUTHWASH: 5.0000 mL | Freq: Four times a day (QID) | ORAL | Status: DC | PRN

## 2017-11-22 MED ORDER — ONDANSETRON HCL 4 MG/2ML IJ SOLN: 4.0000 mg | Freq: Four times a day (QID) | INTRAMUSCULAR | Status: DC | PRN

## 2017-11-22 MED ORDER — DIPHENHYDRAMINE HCL 50 MG/ML IJ SOLN
25.0000 mg | Freq: Once | INTRAMUSCULAR | Status: AC
  Administered 2017-11-22: 25 mg via INTRAVENOUS
  Filled 2017-11-22: qty 1

## 2017-11-22 MED ORDER — RASBURICASE 1.5 MG IV SOLR
6.0000 mg | Freq: Once | INTRAVENOUS | Status: AC
  Administered 2017-11-22: 6 mg via INTRAVENOUS
  Filled 2017-11-22: qty 4

## 2017-11-22 MED ORDER — SODIUM CHLORIDE 0.9 % IV SOLN
INTRAVENOUS | Status: DC
  Administered 2017-11-23 – 2017-11-25 (×4): via INTRAVENOUS

## 2017-11-22 MED ORDER — SENNOSIDES-DOCUSATE SODIUM 8.6-50 MG PO TABS: 1.0000 | ORAL_TABLET | Freq: Every evening | ORAL | Status: DC | PRN

## 2017-11-22 MED ORDER — SEVELAMER CARBONATE 800 MG PO TABS
800.0000 mg | ORAL_TABLET | Freq: Two times a day (BID) | ORAL | Status: DC
  Administered 2017-11-23 – 2017-11-25 (×6): 800 mg via ORAL
  Filled 2017-11-22 (×6): qty 1

## 2017-11-22 MED ORDER — MAGIC MOUTHWASH W/LIDOCAINE: 5.0000 mL | Freq: Four times a day (QID) | ORAL | Status: DC | PRN

## 2017-11-22 MED ORDER — LIDOCAINE VISCOUS HCL 2 % MT SOLN: 5.0000 mL | Freq: Four times a day (QID) | OROMUCOSAL | Status: DC | PRN

## 2017-11-22 MED ORDER — ACETAMINOPHEN 650 MG RE SUPP: 650.0000 mg | Freq: Four times a day (QID) | RECTAL | Status: DC | PRN

## 2017-11-22 MED ORDER — SODIUM BICARBONATE 650 MG PO TABS
650.0000 mg | ORAL_TABLET | Freq: Every day | ORAL | Status: DC
  Administered 2017-11-23 – 2017-11-25 (×3): 650 mg via ORAL
  Filled 2017-11-22 (×3): qty 1

## 2017-11-22 MED ORDER — VITAMIN C 500 MG PO TABS
500.0000 mg | ORAL_TABLET | Freq: Every day | ORAL | Status: DC
  Administered 2017-11-23 – 2017-11-25 (×3): 500 mg via ORAL
  Filled 2017-11-22 (×3): qty 1

## 2017-11-22 MED ORDER — SODIUM CHLORIDE 0.9% FLUSH
10.0000 mL | Freq: Once | INTRAVENOUS | Status: AC
  Administered 2017-11-22: 10 mL via INTRAVENOUS

## 2017-11-22 NOTE — Progress Notes (Signed)
CRITICAL VALUE ALERT Critical value received:  WBC 270.5  Date of notification:  11/22/17 Time of notification: 0959 Critical value read back:  Yes.   Nurse who received alert:  Venita Lick LPN MD notified (1st page):  Dr. Delton Coombes at 10:16  Pt was currently being seen in the clinic. Provider aware.

## 2017-11-22 NOTE — Progress Notes (Signed)
Initial Nutrition Assessment  DOCUMENTATION CODES:   Non-severe (moderate) malnutrition in context of chronic illness  INTERVENTION:  When diet is advanced please start Ensure Enlive (strawberry) TID daily.   NUTRITION DIAGNOSIS:   Moderate Malnutrition related to chronic illness(Leukemia) as evidenced by percent weight loss, mild muscle depletion, mild fat depletion.   GOAL:  Pt to meet >/= 90% of their estimated nutrition needs    MONITOR:  Diet advancement, PO intake, Labs, Weight trends  REASON FOR ASSESSMENT:   Malnutrition Screening Tool    ASSESSMENT: The patient is an 82 yo male with hx of Chronic lymphocytic leukemia, B12 deficiency, DM-, HTN , melanoma. Patient was directly admitted from the [facility] this afternoon.  Orders to receive 2 units PRBC. He is complaining of increased weakness and diminished appetite. Critical lab: WBC- 270.5. Also-Glucose 196,  BUN 36 and Cr. 2.08.  Patient from home. Independent with ambulation and feeding. Spouse says they follow a low salt diet at home. The patient usually alternates breakfast choices between eggs and toast, oatmeal or cold cereal. Lunch is sometimes soups or stew and dinner is mainly vegetables. He does not eat meat. He eats dry beans, eggs (2-3 times weekly) as a source of protein and drinks 2-3 nutrition shakes daily. Spouse says the Dr. Has told him to drink water and occsasionlly he drinks a Mtn Dew.   Patient was assessed in May remotely by RD. His weight at that time 117.11 lb. Current weight shows loss of 3 lbs in approximately 2 months and  His weight was 133-135 lb in December of 2018 and February. The 143 lb seems to be an outlier. His weight is down 15 lb  (12%) in < 3 months which is severe for timeframe. His muscle and fat mass are mildly depleted in addition to his weight loss meets criteria for malnutrition.   Medications reviewed and include: none entered at this time   Labs: BMP Latest Ref Rng & Units  11/22/2017 11/15/2017 10/30/2017  Glucose 70 - 99 mg/dL 196(H) 184(H) 144(H)  BUN 8 - 23 mg/dL 36(H) 35(H) 27(H)  Creatinine 0.61 - 1.24 mg/dL 2.08(H) 2.04(H) 1.95(H)  Sodium 135 - 145 mmol/L 135 137 138  Potassium 3.5 - 5.1 mmol/L 4.3 5.4(H) 4.6  Chloride 98 - 111 mmol/L 99 103 105  CO2 22 - 32 mmol/L 25 24 26   Calcium 8.9 - 10.3 mg/dL 8.6(L) 8.5(L) 8.5(L)     NUTRITION - FOCUSED PHYSICAL EXAM:    Most Recent Value  Orbital Region  Mild depletion  Upper Arm Region  Mild depletion  Thoracic and Lumbar Region  Unable to assess  Buccal Region  No depletion  Temple Region  Mild depletion  Clavicle Bone Region  Mild depletion  Clavicle and Acromion Bone Region  Moderate depletion  Scapular Bone Region  Moderate depletion  Dorsal Hand  Mild depletion  Patellar Region  Unable to assess  Anterior Thigh Region  Unable to assess  Posterior Calf Region  Unable to assess  Edema (RD Assessment)  None  Hair  Reviewed  Eyes  Reviewed  Mouth  Reviewed  Skin  Reviewed  Nails  Reviewed      Diet Order:   Diet Order    None      EDUCATION NEEDS:   No education needs have been identified at this time   Skin:  Skin Assessment: Reviewed RN Assessment(red lump on left forehead)  Last BM:  7/12  Height:   Ht Readings from  Last 1 Encounters:  10/04/17 5\' 1"  (1.549 m)    Weight:   Wt Readings from Last 1 Encounters:  11/22/17 115 lb (52.2 kg)    Ideal Body Weight:     BMI:  There is no height or weight on file to calculate BMI.  Estimated Nutritional Needs:   Kcal:  1500-1700   Protein:  62-68 gr   Fluid:  >1500 ml daily   Colman Cater MS,RD,CSG,LDN Office: [phone] Pager: [phone]

## 2017-11-22 NOTE — Assessment & Plan Note (Addendum)
1. Stage IV CLL with history of poor tolerance to past chemotherapies: -Could not tolerate past treatments including FCR in 2010, Imbruvica 2015 (located by large leg rash), Zydelig 9629 (complicated by transaminitis despite dose reduction), Imbruvica rechallenge in 5284 (complicated by infection and progressive fatigue), bendamustine/Rituxan 1 cycle in 1324 (complicated by prolonged and significant cytopenia in addition to tumor lysis syndrome and renal failure).  He could not even tolerate last treatment with obinutuzumab and chlorambucil. -He reports recent onset night sweats, tiredness on exertion and 15 pound weight loss in the last 1 month. -As his hemoglobin is going down and becoming progressively thrombocytopenic, he was recommended to start ibrutinib at the lowest dose of 140 mg daily.  Given his high white count, renal insufficiency and age, we have to aggressively prophylax against tumor lysis syndrome. -We have started him on fluids.  He also received rasburicase 6 mg today.  He will start Ibrutinib 140 mg today.  He will be admitted to the hospital for close observation of electrolytes.  He will also be started on allopurinol 300 mg once daily. -For his severe anemia, he will receive 2 units of blood transfusion. - We will monitor his tumor lysis labs including BMP, phosphate, LDH, uric acid every 12 hours.  We will continue hydration at 50-100 mL per hour and titrate up as needed. -I have spoken with Dr. Dyann Kief who kindly accepted to admit this patient for his further management.  2.  Left temporal melanoma: -This was reportedly biopsied by Dr. Nevada Crane few months ago and was thought to be melanoma.  Patient developed left preauricular adenopathy in the last few weeks.  Wife also reports that the temporal nodular lesion is increasing in the last 2 to 3 weeks.  We will address this once he is safely able to manage Ibrutinib.  3. TLS prevention: - He received rasburicase.  He will continue  intravenous hydration to keep a good urinary output.  He will need to be started on allopurinol 300 mg once a day.  Tumor lysis syndrome labs should be done twice daily.

## 2017-11-22 NOTE — Progress Notes (Signed)
To treatment area for hydration and Elitek and to take first dose of Imbruvica by mouth today.  Patient stated SOB with exertion and taking medication for Pneumonia that has happened twice over the past two months.  PRN cough with yellow/white phlegm that is thick or thin per patients wife.  Patient complaints of fatigue and weakness.  Denied rashes and itching.  No complaints of neuropathy and fair to decreased appetite.    Patient taking Allopurinol as directed.  Reviewed plan to be admitted for observation with the patient and wife with understanding verbalized.  Reviewed directions for Imbruvica with all questions asked and answered.    Patient seen by the oncologist with orders for 2 units of PRBC to be given on the third floor.  Orders entered and blood bank notified.    1307-Imbruvica tab given by wife as directed by oncologist. Patient reminded to drink water with medication.    1330-report given to Evalyn Casco, RN.  Reviewed patients history with all questions asked and answered.   Patient tolerated elitek and hydration with no complaints voiced.  Patient reminded to drink water.  Patients port left accessed.  Port site clean and dry with no bruising or swelling noted at site.  Tegaderm dressing clean, dry, and intact.  No complaints of pain at site with port or with flush.  Patient taken to room 318 by wheelchair with no s/s of distress noted.  Warm and dry with no complaints voiced.  Family at side.   1350-Patient transferred to the bed and vital signs taken.  No s/s of distress noted.  Patient turned over to Santa Lighter, Therapist, sports.

## 2017-11-22 NOTE — Patient Instructions (Signed)
[place] at [facility]  Discharge Instructions:  You received elitek today.  Return on Monday for follow up visit and labs as directed.   _______________________________________________________________  Thank you for choosing [place] at [facility] to provide your oncology and hematology care.  To afford each patient quality time with our providers, please arrive at least 15 minutes before your scheduled appointment.  You need to re-schedule your appointment if you arrive 10 or more minutes late.  We strive to give you quality time with our providers, and arriving late affects you and other patients whose appointments are after yours.  Also, if you no show three or more times for appointments you may be dismissed from the clinic.  Again, thank you for choosing [place] at [place] hope is that these requests will allow you access to exceptional care and in a timely manner. _______________________________________________________________  If you have questions after your visit, please contact our office at [phone] between the hours of 8:30 a.m. and 5:00 p.m. Voicemails left after 4:30 p.m. will not be returned until the following business day. _______________________________________________________________  For prescription refill requests, have your pharmacy contact our office. _______________________________________________________________  Recommendations made by the consultant and any test results will be sent to your referring physician. _______________________________________________________________

## 2017-11-22 NOTE — H&P (Signed)
History and Physical    Randall Reyes YSA:[id number] DOB: [birth year] DOA: 11/22/2017  Referring MD/NP/PA: Dr. Delton Coombes PCP: [name], MD  Patient coming from: [place]  Chief Complaint: Sent for prophylaxis against tumor lysis syndrome  HPI: Randall Reyes is a 82 y.o. male who is currently undergoing treatment for CLL with a WBC count of around 270,000 which is down from greater than 300,000 recently.  He is currently receiving Imbruvica.  He has no current complaints.  He is referred to [place] as a direct admission at the recommendation of oncology for prophylaxis against tumor lysis syndrome.  Past Medical/Surgical History: Past Medical History:  Diagnosis Date  . Anemia   . B12 deficiency 12/07/2014  . CLL (chronic lymphocytic leukemia) ([place]) 01/24/2011  . Diabetes mellitus   . DM (diabetes mellitus) ([place]) 01/24/2011  . Eczema 01/24/2011  . History of pneumonia 04/2014  . Hypertension   . Leukemia ([place]) 6.22.2012   PER PATIENT  . Melanoma in situ ([place]) 01/24/2011  . Mild obesity 01/24/2011  . Port catheter in place 05/02/2012  . Shingles   . Ulcer     Past Surgical History:  Procedure Laterality Date  . bleeding ulcer    . CATARACT EXTRACTION W/PHACO  05/05/2012   Procedure: CATARACT EXTRACTION PHACO AND INTRAOCULAR LENS PLACEMENT (IOC);  Surgeon: Tonny Branch, MD;  Location: [facility];  Service: Ophthalmology;  Laterality: Right;  CDE:13.25  . CATARACT EXTRACTION W/PHACO  05/15/2012   Procedure: CATARACT EXTRACTION PHACO AND INTRAOCULAR LENS PLACEMENT (IOC);  Surgeon: Tonny Branch, MD;  Location: [facility] OR[facility];  Service: Ophthalmology;  Laterality: Left;  CDE:  12.32  . COLONOSCOPY N/A 01/30/2016   Procedure: COLONOSCOPY;  Surgeon: Danie Binder, MD;  Location: [facility] ENDO SUITE;  Service: Endoscopy;  Laterality: N/A;  2:00 PM - moved to 1:00 - office notified pt  . ESOPHAGOGASTRODUODENOSCOPY  2008   Dr. Oneida Alar: normal esophagus, antral erythema, 1 cm clean based  duodenal ulcera, negative H. plyori  . ESOPHAGOGASTRODUODENOSCOPY N/A 01/30/2016   Procedure: ESOPHAGOGASTRODUODENOSCOPY (EGD);  Surgeon: Danie Binder, MD;  Location: [facility];  Service: Endoscopy;  Laterality: N/A;  . HERNIA REPAIR  2001  . PACEMAKER IMPLANT N/A 02/07/2017   Procedure: PACEMAKER IMPLANT;  Surgeon: Constance Haw, MD;  Location: [facility] LAB;  Service: Cardiovascular;  Laterality: N/A;  . PORTACATH PLACEMENT  2008    Social History:  reports that he quit smoking about 40 years ago. He quit smokeless tobacco use about 50 years ago. He reports that he does not drink alcohol or use drugs.  Allergies: Allergies  Allergen Reactions  . Aspirin Other (See Comments)    Bleeding ulcers.   . Ibuprofen Other (See Comments)    Bleeding ulcers.    Family History:  Family History  Problem Relation Age of Onset  . Diabetes Father   . Colon cancer Neg Hx     Prior to Admission medications   Medication Sig Start Date End Date Taking? Authorizing Provider  allopurinol (ZYLOPRIM) 100 MG tablet Take 1 tablet (100 mg total) by mouth daily. 11/01/17  Yes Derek Jack, MD  cholecalciferol (VITAMIN D) 400 units TABS tablet Take 400 Units by mouth daily.   Yes [provider]  doxycycline (VIBRA-TABS) 100 MG tablet Take 100 mg by mouth 2 (two) times daily. 7 day course starting on 11/19/2017 11/19/17  Yes [provider]  feeding supplement, ENSURE ENLIVE, (ENSURE ENLIVE) LIQD Take 237 mLs by  mouth 2 (two) times daily between meals. 10/08/17  Yes Dhungel, Nishant, MD  ibrutinib (IMBRUVICA) 140 MG capsul Take 140 mg PO daily x 7 days and then increase to 280 mg PO daily thereafter Patient taking differently: Take 140-280 mg by mouth See admin instructions. Take 140 mg  daily x 7 days and then increase to 280 mg daily thereafter starting on 11/22/2017 11/01/17  Yes Kefalas, Manon Hilding, PA-C  levETIRAcetam (KEPPRA) 100 MG/ML solution Take 7.5 mLs (750 mg  total) by mouth 2 (two) times daily. 02/08/17  Yes Nita Sells, MD  magic mouthwash w/lidocaine SOLN Take 5 mLs by mouth 4 (four) times daily as needed for mouth pain. 06/20/17  Yes Jacquelin Hawking, NP  rosuvastatin (CRESTOR) 40 MG tablet Take 40 mg by mouth at bedtime.  02/21/16  Yes [provider]  sevelamer carbonate (RENVELA) 800 MG tablet Take 2 tablets (1,600 mg total) by mouth 3 (three) times daily with meals. Patient taking differently: Take 800 mg by mouth 2 (two) times daily with a meal.  05/28/16  Yes Isaac Bliss, Rayford Halsted, MD  sitaGLIPtin (JANUVIA) 100 MG tablet Take 100 mg by mouth daily.   Yes [provider]  sodium bicarbonate 650 MG tablet TAKE 1 TABLET BY MOUTH TWICE A DAY AS DIRECTED 04/24/16  Yes [provider]  vitamin C (ASCORBIC ACID) 500 MG tablet Take 500 mg by mouth daily.   Yes [provider]  ACCU-CHEK AVIVA PLUS test strip  05/15/17   [provider]  ACCU-CHEK SOFTCLIX LANCETS lancets  10/14/17   [provider]  benzonatate (TESSALON) 200 MG capsule TAKE ONE CAPSULE BY MOUTH TWICE A DAY AS NEEDED FOR COUGH 11/15/17   [provider]    Review of Systems:  Constitutional: Denies fever, chills, diaphoresis, appetite change and fatigue.  HEENT: Denies photophobia, eye pain, redness, hearing loss, ear pain, congestion, sore throat, rhinorrhea, sneezing, mouth sores, trouble swallowing, neck pain, neck stiffness and tinnitus.   Respiratory: Denies SOB, DOE, cough, chest tightness,  and wheezing.   Cardiovascular: Denies chest pain, palpitations and leg swelling.  Gastrointestinal: Denies nausea, vomiting, abdominal pain, diarrhea, constipation, blood in stool and abdominal distention.  Genitourinary: Denies dysuria, urgency, frequency, hematuria, flank pain and difficulty urinating.  Endocrine: Denies: hot or cold intolerance, sweats, changes in hair or nails, polyuria, polydipsia. Musculoskeletal:  Denies myalgias, back pain, joint swelling, arthralgias and gait problem.  Skin: Denies pallor, rash and wound.  Neurological: Denies dizziness, seizures, syncope, weakness, light-headedness, numbness and headaches.  Hematological: Denies adenopathy. Easy bruising, personal or family bleeding history  Psychiatric/Behavioral: Denies suicidal ideation, mood changes, confusion, nervousness, sleep disturbance and agitation    Physical Exam: There were no vitals filed for this visit.    Constitutional: NAD, calm, comfortable Eyes: PERRL, lids and conjunctivae normal ENMT: Mucous membranes are moist. Posterior pharynx clear of any exudate or lesions.Normal dentition.  Neck: normal, supple, no masses, no thyromegaly Respiratory: clear to auscultation bilaterally, no wheezing, no crackles. Normal respiratory effort. No accessory muscle use.  Cardiovascular: Regular rate and rhythm, no murmurs / rubs / gallops. No extremity edema. 2+ pedal pulses. No carotid bruits.  Abdomen: no tenderness, no masses palpated. No hepatosplenomegaly. Bowel sounds positive.  Musculoskeletal: no clubbing / cyanosis. No joint deformity upper and lower extremities. Good ROM, no contractures. Normal muscle tone.  Skin: no rashes, lesions, ulcers. No induration Neurologic: CN 2-12 grossly intact. Sensation intact, DTR normal. Strength 5/5 in all 4.  Psychiatric: Normal  judgment and insight. Alert and oriented x 3. Normal mood.    Labs on Admission: I have personally reviewed the following labs and imaging studies  CBC: Recent Labs  Lab 11/22/17 0927  WBC 270.5*  NEUTROABS 8.1*  HGB 7.0*  HCT 22.6*  MCV 114.7*  PLT 58*   Basic Metabolic Panel: Recent Labs  Lab 11/22/17 0927  NA 135  K 4.3  CL 99  CO2 25  GLUCOSE 196*  BUN 36*  CREATININE 2.08*  CALCIUM 8.6*  PHOS 2.9   GFR: Estimated Creatinine Clearance: 20.9 mL/min (A) (by C-G formula based on SCr of 2.08 mg/dL (H)). Liver Function  Tests: Recent Labs  Lab 11/22/17 0927  AST 36  ALT 29  ALKPHOS 159*  BILITOT 0.5  PROT 7.1  ALBUMIN 2.5*   No results for input(s): LIPASE, AMYLASE in the last 168 hours. No results for input(s): AMMONIA in the last 168 hours. Coagulation Profile: No results for input(s): INR, PROTIME in the last 168 hours. Cardiac Enzymes: No results for input(s): CKTOTAL, CKMB, CKMBINDEX, TROPONINI in the last 168 hours. BNP (last 3 results) No results for input(s): PROBNP in the last 8760 hours. HbA1C: No results for input(s): HGBA1C in the last 72 hours. CBG: No results for input(s): GLUCAP in the last 168 hours. Lipid Profile: No results for input(s): CHOL, HDL, LDLCALC, TRIG, CHOLHDL, LDLDIRECT in the last 72 hours. Thyroid Function Tests: No results for input(s): TSH, T4TOTAL, FREET4, T3FREE, THYROIDAB in the last 72 hours. Anemia Panel: No results for input(s): VITAMINB12, FOLATE, FERRITIN, TIBC, IRON, RETICCTPCT in the last 72 hours. Urine analysis:    Component Value Date/Time   COLORURINE YELLOW 10/04/2017 [place] 10/04/2017 1755   LABSPEC 1.008 10/04/2017 1755   PHURINE 6.0 10/04/2017 1755   GLUCOSEU NEGATIVE 10/04/2017 1755   HGBUR NEGATIVE 10/04/2017 1755   BILIRUBINUR NEGATIVE 10/04/2017 1755   [place] 10/04/2017 1755   PROTEINUR NEGATIVE 10/04/2017 1755   UROBILINOGEN 0.2 02/11/2015 1718   NITRITE NEGATIVE 10/04/2017 1755   LEUKOCYTESUR NEGATIVE 10/04/2017 1755   Sepsis Labs: @LABRCNTIP (procalcitonin:4,lacticidven:4) )No results found for this or any previous visit (from the past 240 hour(s)).   Radiological Exams on Admission: No results found.  EKG: Independently reviewed.  None obtained  Assessment/Plan Active Problems:   CLL (chronic lymphocytic leukemia) (HCC)   Melanoma in situ ([place])   DM (diabetes mellitus) (HCC)   Malnutrition of moderate degree (HCC)   HTN (hypertension)   Tumor lysis syndrome   CKD (chronic kidney  disease), stage III (HCC)    CLL -Currently undergoing treatment under the care of oncology. -Per oncology recommendations he needs to be admitted for prophylaxis against tumor lysis syndrome. -His recommendations are to start allopurinol 300 mg daily, saline at 75 cc an hour, will also follow phosphorus, magnesium and comprehensive metabolic panel daily.  Melanoma in situ of the left scalp -Per oncology they plan to address this once he is safely able to manage current treatment for his CLL.  Stage III chronic kidney disease -Stable creatinine remains at his baseline of around 1.9-2.0  Diabetes -Check A1c, place on sensitive sliding scale.  Anemia -Per oncology recommendations we will transfuse 1 unit of PRBCs, his hemoglobin on admission is 7.   DVT prophylaxis: SCDs Code Status: Full code Family Communication: Patient only Disposition Plan: Home over weekend as long as stable Consults called: None Admission status: Admit - It is my clinical opinion that admission to INPATIENT is reasonable and  necessary because of the expectation that this patient will require hospital care that crosses at least 2 midnights to treat this condition based on the medical complexity of the problems presented.  Given the aforementioned information, the predictability of an adverse outcome is felt to be significant.      Time Spent: 85 minutes  Estela Isaac Bliss MD [facility] Pager [phone]  If 7PM-7AM, please contact night-coverage [email or web address] Password [facility]  11/22/2017, 6:39 PM

## 2017-11-22 NOTE — Progress Notes (Signed)
Randall Reyes, [place] [address]   CLINIC:  Medical Oncology/Hematology  PCP:  [name], MD [place] [address] [phone]   REASON FOR VISIT:  Follow-up for Chronic Lymphocytic Leukemia (CLL)  CURRENT THERAPY: REDUCED DOSE of Elitek   BRIEF ONCOLOGIC HISTORY:    CLL (chronic lymphocytic leukemia) ([facility] [facility])   02/14/2005 Procedure    Peripheral flow cytometry- CLL (without 11q or 17 p deletion) presenting with stage 0 disease      10/25/2008 Progression    Increasing WBC and symptoms suggestive of progressive disease      11/09/2008 - 01/07/2009 Chemotherapy    Rituxan, fludarabine, cytoxan x 3 cycles with the discontinuation of fludarabine and cytoxan due to severe pancytopenia requiring hospitalization.      01/10/2009 Adverse Reaction    Severe pancytopenia and adverse side effects      02/01/2009 - 04/01/2009 Chemotherapy    Rituxan every 21 days x 3 cycles      04/02/2009 Remission    Stable disease.  Chemotherapy holiday started      06/22/2013 - 06/29/2013 Chemotherapy    Imbruvica daily      06/29/2013 Adverse Reaction    Large right posterior leg (popliteal) ecchymosis/violaceous rash.  Likely Imbruvica-induced      07/31/2013 - 10/12/2013 Chemotherapy    Zydelig (idelalisib) 150 mg BID monotherapy      10/12/2013 Adverse Reaction    Transaminitis, suspected to be secondary to [place].  Dose decreased.      10/12/2013 - 11/09/2013 Chemotherapy    Zydelig (idelalisib) 150 mg daily.      11/09/2013 Adverse Reaction    Worsening transaminitis.  Zydelig on HOLD      11/09/2013 Treatment Plan Change    Hold Zydelig      12/28/2013 Adverse Reaction    Herpes Zoster outbreak.  Partial response with Famvir x 1 week, switched to acyclovir.  Progression over 1-2 weeks with development of keratoconjunctivitis bilaterally.  VZ Immune Globulin 625 mg IM given on 01/13/14.      02/08/2014 Treatment Plan Change   Ibrutinib restarted 140 mg daily for one week followed by 280 mg daily followed by 420 mg daily with prophylactic acyclovir.      02/08/2014 - 07/05/2014 Chemotherapy    Ibrutinib 420 mg daily      1Sep 12, 202015 - 04/17/2014 Hospital Admission    Right lower lobe pneumonia      07/05/2014 Adverse Reaction    Fissuring of fingertips and sore, red feet with transaminitis      07/05/2014 Treatment Plan Change    Hold Ibrutinib      07/22/2014 - 08/19/2014 Chemotherapy    Ibrutinib restarted at 280 mg daily.      08/09/2014 - 08/12/2014 Hospital Admission    Bilateral pneumonia      08/19/2014 Adverse Reaction    Nausea with vomiting.  Escorted to ED.      08/19/2014 Treatment Plan Change    Ibrutinib on hold- ED escort from clinic       10/18/2014 Miscellaneous    B12 weekly x 4 then monthly.      12/15/2015 Imaging    Bone density- BMD as determined from Femur Neck Left is 0.803 g/cm2 with a T-score of -2.1. This patient is considered osteopenic by World Healh Organization (WHO) Criteria.      12/16/2015 Imaging    CT abd/pelvis-  Progression of retroperitoneal and portacaval adenopathy compared  to the prior study. Likely related to CLL.  Small hiatal hernia  Mild compression fractures T11 and T12, probably old. Correlate with any pain in this area.      05/22/2016 - 05/28/2016 Chemotherapy    BR day 1 and 2 x 1 cycle.  Bendamustine dose reduced by 50%.      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      01/28/2017 -  Chemotherapy    Cycle 1 day 1 of Gayzva and Chlorambucil. Rasburicase 3 mg given also.       01/28/2017 - 02/08/2017 Hospital Admission    Admitted for sycopal episode due to complete heart block. Patient went into PEA arrest requiring CPR and  intubation. 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 Pacemaker placed while inpatient. Started on eliquis.        INTERVAL HISTORY:  Randall Reyes 82 y.o. male returns for routine follow-up CLL and consideration for next cycle of chemotherapy. Patient is here today with his wife. Patient has lost 3 pounds since his last visit. Patient has been weaker and more fatigued than usual. He is having night sweats. Patient has large nodule on the left side of his forehead. The nodules has been increasing in size over the last month. Patient has not seen Dr. Nevada Crane about it but will make an appointment. Wife states he has had melanoma cut of his left ear a few months ago. Last visit he was recovering from celulitis of the face which looks improved today. Patient is willing and aware that he will be admitted to the hospital today for close observation after his Elitek dose today.     REVIEW OF SYSTEMS:  Review of Systems  Constitutional: Positive for fatigue.       Night sweats  HENT:  Negative.   Eyes: Negative.   Respiratory: Negative.   Cardiovascular: Negative.   Gastrointestinal: Negative.   Endocrine: Negative.   Genitourinary: Negative.    Skin: Negative.   Neurological: Positive for extremity weakness.  Hematological: Bruises/bleeds easily.  Psychiatric/Behavioral: Negative.      PAST MEDICAL/SURGICAL HISTORY:  Past Medical History:  Diagnosis Date  . Anemia   . B12 deficiency 12/07/2014  . CLL (chronic lymphocytic leukemia) ([facility]) 01/24/2011  . Diabetes mellitus   . DM (diabetes mellitus) ([facility]) 01/24/2011  . Eczema 01/24/2011  . History of pneumonia 04/2014  . Hypertension   . Leukemia ([facility]) 6.22.2012   PER PATIENT  . Melanoma in situ ([facility]) 01/24/2011  . Mild obesity 01/24/2011  . Port catheter in place 05/02/2012  . Shingles   . Ulcer    Past Surgical History:  Procedure Laterality Date  . bleeding ulcer    . CATARACT EXTRACTION W/PHACO   05/05/2012   Procedure: CATARACT EXTRACTION PHACO AND INTRAOCULAR LENS PLACEMENT (IOC);  Surgeon: Tonny Branch, MD;  Location: [facility];  Service: Ophthalmology;  Laterality: Right;  CDE:13.25  . CATARACT EXTRACTION W/PHACO  05/15/2012   Procedure: CATARACT EXTRACTION PHACO AND INTRAOCULAR LENS PLACEMENT (IOC);  Surgeon: Tonny Branch, MD;  Location: [facility];  Service: Ophthalmology;  Laterality: Left;  CDE:  12.32  . COLONOSCOPY N/A 01/30/2016   Procedure: COLONOSCOPY;  Surgeon: Danie Binder, MD;  Location: [facility];  Service: Endoscopy;  Laterality: N/A;  2:00 PM - moved to 1:00 - office  notified pt  . ESOPHAGOGASTRODUODENOSCOPY  2008   Dr. Oneida Alar: normal esophagus, antral erythema, 1 cm clean based duodenal ulcera, negative H. plyori  . ESOPHAGOGASTRODUODENOSCOPY N/A 01/30/2016   Procedure: ESOPHAGOGASTRODUODENOSCOPY (EGD);  Surgeon: Danie Binder, MD;  Location: [facility];  Service: Endoscopy;  Laterality: N/A;  . HERNIA REPAIR  2001  . PACEMAKER IMPLANT N/A 02/07/2017   Procedure: PACEMAKER IMPLANT;  Surgeon: Constance Haw, MD;  Location: [facility];  Service: Cardiovascular;  Laterality: N/A;  . PORTACATH PLACEMENT  2008     SOCIAL HISTORY:  Social History   Socioeconomic History  . Marital status: Married    Spouse name: Not on file  . Number of children: Not on file  . Years of education: Not on file  . Highest education level: Not on file  Occupational History  . Not on file  Social Needs  . Financial resource strain: Not on file  . Food insecurity:    Worry: Not on file    Inability: Not on file  . Transportation needs:    Medical: Not on file    Non-medical: Not on file  Tobacco Use  . Smoking status: Former Smoker    Last attempt to quit: 09/11/1977    Years since quitting: 40.2  . Smokeless tobacco: Former Systems developer    Quit date: 04/25/1967  Substance and Sexual Activity  . Alcohol use: No  . Drug use: No  . Sexual activity: Not on file  Lifestyle  .  Physical activity:    Days per week: Not on file    Minutes per session: Not on file  . Stress: Not on file  Relationships  . Social connections:    Talks on phone: Not on file    Gets together: Not on file    Attends religious service: Not on file    Active member of club or organization: Not on file    Attends meetings of clubs or organizations: Not on file    Relationship status: Not on file  . Intimate partner violence:    Fear of current or ex partner: Not on file    Emotionally abused: Not on file    Physically abused: Not on file    Forced sexual activity: Not on file  Other Topics Concern  . Not on file  Social History Narrative  . Not on file    FAMILY HISTORY:  Family History  Problem Relation Age of Onset  . Diabetes Father   . Colon cancer Neg Hx     CURRENT MEDICATIONS:  Facility-Administered Encounter Medications as of 11/22/2017  Medication  . 0.9 %  sodium chloride infusion  . heparin lock flush 100 unit/mL  . [COMPLETED] rasburicase (ELITEK) 6 mg in sodium chloride 0.9 % 46 mL IVPB  . sodium chloride flush (NS) 0.9 % injection 10 mL  . [COMPLETED] sodium chloride flush (NS) 0.9 % injection 10 mL   Outpatient Encounter Medications as of 11/22/2017  Medication Sig  . ACCU-CHEK AVIVA PLUS test strip   . ACCU-CHEK SOFTCLIX LANCETS lancets   . acetaminophen (TYLENOL) 500 MG tablet Take 500 mg by mouth at bedtime as needed for mild pain or moderate pain.   [name] allopurinol (ZYLOPRIM) 100 MG tablet Take 1 tablet (100 mg total) by mouth daily.  . benzonatate (TESSALON) 200 MG capsule TAKE ONE CAPSULE BY MOUTH TWICE A DAY AS NEEDED FOR COUGH  . cholecalciferol (VITAMIN D) 400 units TABS tablet Take 400 Units by  mouth daily.  Mariane Baumgarten Calcium (STOOL SOFTENER PO) Take 1 capsule by mouth as needed.  . doxycycline (VIBRA-TABS) 100 MG tablet   . feeding supplement, ENSURE ENLIVE, (ENSURE ENLIVE) LIQD Take 237 mLs by mouth 2 (two) times daily between meals.  .  ibrutinib (IMBRUVICA) 140 MG capsul Take 140 mg PO daily x 7 days and then increase to 280 mg PO daily thereafter  . levETIRAcetam (KEPPRA) 100 MG/ML solution Take 7.5 mLs (750 mg total) by mouth 2 (two) times daily.  . magic mouthwash w/lidocaine SOLN Take 5 mLs by mouth 4 (four) times daily as needed for mouth pain.  . rosuvastatin (CRESTOR) 40 MG tablet Take 40 mg by mouth at bedtime.   . sevelamer carbonate (RENVELA) 800 MG tablet Take 2 tablets (1,600 mg total) by mouth 3 (three) times daily with meals. (Patient taking differently: Take 800 mg by mouth 3 (three) times daily with meals. )  . sitaGLIPtin (JANUVIA) 100 MG tablet Take 100 mg by mouth daily.  . sodium bicarbonate 650 MG tablet TAKE 1 TABLET BY MOUTH TWICE A DAY AS DIRECTED    ALLERGIES:  Allergies  Allergen Reactions  . Aspirin Other (See Comments)    Bleeding ulcers.   . Ibuprofen Other (See Comments)    Bleeding ulcers.     PHYSICAL EXAM:  ECOG Performance status: 2  VITAL SIGNS: BP: 94/58, P: 94, R:16, TEMP: 97.7, 02: 94%  Physical Exam HEENT: Left temporal fleshy nodule with a solid base present. Left preauricular lymphadenopathy present.  There is a very small right axillary adenopathy. Abdominal examination did not reveal any hepatosplenomegaly. Extremities: No edema or cyanosis.  LABORATORY DATA:  I have reviewed the labs as listed.  CBC    Component Value Date/Time   WBC 270.5 (HH) 11/22/2017 0927   RBC 1.97 (L) 11/22/2017 0927   HGB 7.0 (L) 11/22/2017 0927   HCT 22.6 (L) 11/22/2017 0927   PLT 58 (L) 11/22/2017 0927   MCV 114.7 (H) 11/22/2017 0927   MCH 35.5 (H) 11/22/2017 0927   MCHC 31.0 11/22/2017 0927   RDW 24.6 (H) 11/22/2017 0927   LYMPHSABS 248.9 (H) 11/22/2017 0927   MONOABS 2.7 (H) 11/22/2017 0927   EOSABS 0.0 11/22/2017 0927   BASOSABS 0.0 11/22/2017 0927   CMP Latest Ref Rng & Units 11/22/2017 11/15/2017 10/30/2017  Glucose 70 - 99 mg/dL 196(H) 184(H) 144(H)  BUN 8 - 23 mg/dL 36(H)  35(H) 27(H)  Creatinine 0.61 - 1.24 mg/dL 2.08(H) 2.04(H) 1.95(H)  Sodium 135 - 145 mmol/L 135 137 138  Potassium 3.5 - 5.1 mmol/L 4.3 5.4(H) 4.6  Chloride 98 - 111 mmol/L 99 103 105  CO2 22 - 32 mmol/L 25 24 26   Calcium 8.9 - 10.3 mg/dL 8.6(L) 8.5(L) 8.5(L)  Total Protein 6.5 - 8.1 g/dL 7.1 7.1 7.6  Total Bilirubin 0.3 - 1.2 mg/dL 0.5 0.2(L) 0.4  Alkaline Phos 38 - 126 U/L 159(H) 146(H) 130(H)  AST 15 - 41 U/L 36 26 32  ALT 0 - 44 U/L 29 18 20        DIAGNOSTIC IMAGING:  I have reviewed CT scan of the chest dated 10/04/2017 which showed adenopathy in the mediastinum.     ASSESSMENT & PLAN:   CLL (chronic lymphocytic leukemia) ([facility]) 1. Stage IV CLL with history of poor tolerance to past chemotherapies: -Could not tolerate past treatments including FCR in 2010, Imbruvica 2015 (located by large leg rash), Zydelig 0867 (complicated by transaminitis despite dose reduction), Imbruvica rechallenge in  5852 (complicated by infection and progressive fatigue), bendamustine/Rituxan 1 cycle in 7782 (complicated by prolonged and significant cytopenia in addition to tumor lysis syndrome and renal failure).  He could not even tolerate last treatment with obinutuzumab and chlorambucil. -He reports recent onset night sweats, tiredness on exertion and 15 pound weight loss in the last 1 month. -As his hemoglobin is going down and becoming progressively thrombocytopenic, he was recommended to start ibrutinib at the lowest dose of 140 mg daily.  Given his high white count, renal insufficiency and age, we have to aggressively prophylax against tumor lysis syndrome. -We have started him on fluids.  He also received rasburicase 6 mg today.  He will start Ibrutinib 140 mg today.  He will be admitted to the hospital for close observation of electrolytes.  He will also be started on allopurinol 300 mg once daily. -For his severe anemia, he will receive 2 units of blood transfusion. - We will monitor his tumor  lysis labs including BMP, phosphate, LDH, uric acid every 12 hours.  We will continue hydration at 50-100 mL per hour and titrate up as needed. -I have spoken with Dr. Dyann Kief who kindly accepted to admit this patient for his further management.  2.  Left temporal melanoma: -This was reportedly biopsied by Dr. Nevada Crane few months ago and was thought to be melanoma.  Patient developed left preauricular adenopathy in the last few weeks.  Wife also reports that the temporal nodular lesion is increasing in the last 2 to 3 weeks.  We will address this once he is safely able to manage Ibrutinib.  3. TLS prevention: - He received rasburicase.  He will continue intravenous hydration to keep a good urinary output.  He will need to be started on allopurinol 300 mg once a day.  Tumor lysis syndrome labs should be done twice daily.            Orders placed this encounter:  No orders of the defined types were placed in this encounter.     Derek Jack, MD [place] [phone]

## 2017-11-23 ENCOUNTER — Other Ambulatory Visit: Payer: Self-pay

## 2017-11-23 DIAGNOSIS — I10 Essential (primary) hypertension: Secondary | ICD-10-CM

## 2017-11-23 DIAGNOSIS — E119 Type 2 diabetes mellitus without complications: Secondary | ICD-10-CM

## 2017-11-23 DIAGNOSIS — E440 Moderate protein-calorie malnutrition: Secondary | ICD-10-CM

## 2017-11-23 LAB — CBC
HCT: 26.5 % — ABNORMAL LOW (ref 39.0–52.0)
Hemoglobin: 8.3 g/dL — ABNORMAL LOW (ref 13.0–17.0)
MCH: 33.6 pg (ref 26.0–34.0)
MCHC: 31.3 g/dL (ref 30.0–36.0)
MCV: 107.3 fL — ABNORMAL HIGH (ref 78.0–100.0)
Platelets: 46 10*3/uL — ABNORMAL LOW (ref 150–400)
RBC: 2.47 MIL/uL — AB (ref 4.22–5.81)
RDW: 24.8 % — AB (ref 11.5–15.5)
WBC: 299.6 10*3/uL (ref 4.0–10.5)

## 2017-11-23 LAB — PHOSPHORUS: Phosphorus: 4.1 mg/dL (ref 2.5–4.6)

## 2017-11-23 LAB — COMPREHENSIVE METABOLIC PANEL
ALT: 29 U/L (ref 0–44)
AST: 37 U/L (ref 15–41)
Albumin: 2.3 g/dL — ABNORMAL LOW (ref 3.5–5.0)
Alkaline Phosphatase: 138 U/L — ABNORMAL HIGH (ref 38–126)
Anion gap: 9 (ref 5–15)
BUN: 35 mg/dL — AB (ref 8–23)
CALCIUM: 8.2 mg/dL — AB (ref 8.9–10.3)
CHLORIDE: 107 mmol/L (ref 98–111)
CO2: 24 mmol/L (ref 22–32)
Creatinine, Ser: 1.8 mg/dL — ABNORMAL HIGH (ref 0.61–1.24)
GFR, EST AFRICAN AMERICAN: 39 mL/min — AB (ref >60)
GFR, EST NON AFRICAN AMERICAN: 34 mL/min — AB (ref >60)
Glucose, Bld: 119 mg/dL — ABNORMAL HIGH (ref 70–99)
Potassium: 4.5 mmol/L (ref 3.5–5.1)
SODIUM: 140 mmol/L (ref 135–145)
TOTAL PROTEIN: 6.4 g/dL — AB (ref 6.5–8.1)
Total Bilirubin: 0.7 mg/dL (ref 0.3–1.2)

## 2017-11-23 LAB — URIC ACID: Uric Acid, Serum: <0.5 mg/dL — ABNORMAL LOW (ref 3.7–8.6)

## 2017-11-23 LAB — MAGNESIUM: MAGNESIUM: 2.3 mg/dL (ref 1.7–2.4)

## 2017-11-23 MED ORDER — ENSURE ENLIVE PO LIQD
237.0000 mL | Freq: Two times a day (BID) | ORAL | Status: DC
  Administered 2017-11-23 – 2017-11-25 (×6): 237 mL via ORAL

## 2017-11-23 NOTE — Progress Notes (Signed)
CRITICAL VALUE ALERT  Critical Value:  WBC 281.6, hemoglobin 5.7   Date & Time Notied:  11/22/17 2030    Provider Notified: [place]   Orders Received/Actions taken: blood transfusion to be started, no new orders.

## 2017-11-23 NOTE — Progress Notes (Signed)
PROGRESS NOTE    Randall Reyes  DDU:[id number] DOB: 1936-05-12 DOA: 11/22/2017 PCP: [name], MD    Brief Narrative:  82 year old male who presented for prophylaxis, imminent tumor lysis syndrome.  He does have significant past medical history for CLL on chemotherapy.  He was found with significant leukocytosis, high risk for tumor lysis syndrome undergoing chemotherapy.  On admission his vital signs were stable, moist mucous membranes, lungs clear to auscultation, heart S1-S2 present rhythmic, abdomen soft nontender, no lower extremity edema.  Patient was admitted to the hospital working diagnosis of imminent tumor lysis syndrome in the setting of CLL.   Assessment & Plan:   Active Problems:   CLL (chronic lymphocytic leukemia) ([facility])   Melanoma in situ ([place] [facility])   DM (diabetes mellitus) ([place])   Malnutrition of moderate degree ([facility])   HTN (hypertension)   Tumor lysis syndrome   CKD (chronic kidney disease), stage III ([place])   1. CLL with imminent tumor lysis syndrome. Will continue hydration and allopurinol, renal function stable, uric acid is low. Follow on renal panel and uric acid in am. Continue ibrutinib.  2. Stage 3 CKD. Stable renal function, continue isotonic saline IV, will follow on renal panel in am, avoid hypotension or nephrotoxic medications. Continue renvela and sodium bicarbonate. Serum cr at 1,80 with K at 4,5 and serum bicarbonate at 24.   4. T2dm. Will continue insulin sliding scale for glucose cover and monitoring, capillary glucose well controlled.    5. Anemia sp one unit prbc transfusion. Stable hb and hct, continue to follow cell count. Persistent leukocytosis, related to leukemia. Hb at 8,3 with wbc at 299.  6. Seizures. Will continue levetircetam.   7. Calorie protein malnutrition. (moderate). Continue nutritional supplements.     DVT prophylaxis: enoxaparin   Code Status:  full Family Communication: I spoke with patient's wife at the bedside  and all questions were addressed.  Disposition Plan/ discharge barriers: pending uric acid and oncology recommendation    Consultants:    Oncology   Procedures:     Antimicrobials:       Subjective: Patient is feeling well, no nausea or vomiting, no chest pain or dyspnea, tolerating po well.   Objective: Vitals:   11/22/17 2109 11/22/17 2125 11/23/17 0100 11/23/17 0539  BP: (!) 88/57 (!) 79/57 97/63 95/64   Pulse: (!) 57 64 (!) 59 66  Resp: 20 20 18 16   Temp: 98.2 F (36.8 C) 98.2 F (36.8 C) 98 F (36.7 C) 97.7 F (36.5 C)  TempSrc: Oral Oral Oral Oral  SpO2: 96% 95% 96% 98%    Intake/Output Summary (Last 24 hours) at 11/23/2017 0733 Last data filed at 11/23/2017 0040 Gross per 24 hour  Intake 790 ml  Output 1200 ml  Net -410 ml   There were no vitals filed for this visit.  Examination:   General: Not in pain or dyspnea, deconditioned  Neurology: Awake and alert, non focal  E ENT: mild pallor, no icterus, oral mucosa moist Cardiovascular: No JVD. S1-S2 present, rhythmic, no gallops, rubs, or murmurs. No lower extremity edema. Pulmonary: vesicular breath sounds bilaterally, adequate air movement, no wheezing, rhonchi or rales. Gastrointestinal. Abdomen with no organomegaly, non tender, no rebound or guarding Skin. No rashes Musculoskeletal: no joint deformities     Data Reviewed: I have personally reviewed following labs and imaging studies  CBC: Recent Labs  Lab 11/22/17 0927 11/22/17 1903 11/23/17 0622  WBC 270.5* 281.6* 299.6*  NEUTROABS 8.1*  --   --  HGB 7.0* 5.7* 8.3*  HCT 22.6* 17.9* 26.5*  MCV 114.7* 112.6* 107.3*  PLT 58* 48* 46*   Basic Metabolic Panel: Recent Labs  Lab 11/22/17 0927  NA 135  K 4.3  CL 99  CO2 25  GLUCOSE 196*  BUN 36*  CREATININE 2.08*  CALCIUM 8.6*  PHOS 2.9   GFR: Estimated Creatinine Clearance: 20.9 mL/min (A) (by C-G formula based on SCr of 2.08 mg/dL (H)). Liver Function Tests: Recent Labs  Lab  11/22/17 0927  AST 36  ALT 29  ALKPHOS 159*  BILITOT 0.5  PROT 7.1  ALBUMIN 2.5*   No results for input(s): LIPASE, AMYLASE in the last 168 hours. No results for input(s): AMMONIA in the last 168 hours. Coagulation Profile: No results for input(s): INR, PROTIME in the last 168 hours. Cardiac Enzymes: No results for input(s): CKTOTAL, CKMB, CKMBINDEX, TROPONINI in the last 168 hours. BNP (last 3 results) No results for input(s): PROBNP in the last 8760 hours. HbA1C: No results for input(s): HGBA1C in the last 72 hours. CBG: No results for input(s): GLUCAP in the last 168 hours. Lipid Profile: No results for input(s): CHOL, HDL, LDLCALC, TRIG, CHOLHDL, LDLDIRECT in the last 72 hours. Thyroid Function Tests: No results for input(s): TSH, T4TOTAL, FREET4, T3FREE, THYROIDAB in the last 72 hours. Anemia Panel: No results for input(s): VITAMINB12, FOLATE, FERRITIN, TIBC, IRON, RETICCTPCT in the last 72 hours.    Radiology Studies: I have reviewed all of the imaging during this hospital visit personally     Scheduled Meds: . allopurinol  300 mg Oral Daily  . ibrutinib  280 mg Oral Daily  . levETIRAcetam  750 mg Oral BID  . sevelamer carbonate  800 mg Oral BID WC  . sodium bicarbonate  650 mg Oral Daily  . vitamin C  500 mg Oral Daily   Continuous Infusions: . sodium chloride 75 mL/hr at 11/23/17 0552     LOS: 1 day        Tawni Millers, MD [facility] Pager [phone]

## 2017-11-23 NOTE — Progress Notes (Signed)
CRITICAL VALUE ALERT  Critical Value: WBC 299.6  Date & Time Notied:  11/23/17 4193  Provider Notified: Arrien  Orders Received/Actions taken:

## 2017-11-24 LAB — PHOSPHORUS: Phosphorus: 4.2 mg/dL (ref 2.5–4.6)

## 2017-11-24 LAB — GLUCOSE, CAPILLARY
Glucose-Capillary: 152 mg/dL — ABNORMAL HIGH (ref 70–99)
Glucose-Capillary: 115 mg/dL — ABNORMAL HIGH (ref 70–99)

## 2017-11-24 LAB — COMPREHENSIVE METABOLIC PANEL
ALBUMIN: 2.2 g/dL — AB (ref 3.5–5.0)
ALT: 25 U/L (ref 0–44)
AST: 30 U/L (ref 15–41)
Alkaline Phosphatase: 129 U/L — ABNORMAL HIGH (ref 38–126)
Anion gap: 8 (ref 5–15)
BILIRUBIN TOTAL: 0.6 mg/dL (ref 0.3–1.2)
BUN: 36 mg/dL — AB (ref 8–23)
CHLORIDE: 109 mmol/L (ref 98–111)
CO2: 22 mmol/L (ref 22–32)
Calcium: 7.9 mg/dL — ABNORMAL LOW (ref 8.9–10.3)
Creatinine, Ser: 1.56 mg/dL — ABNORMAL HIGH (ref 0.61–1.24)
GFR calc Af Amer: 47 mL/min — ABNORMAL LOW (ref >60)
GFR calc non Af Amer: 40 mL/min — ABNORMAL LOW (ref >60)
GLUCOSE: 114 mg/dL — AB (ref 70–99)
Potassium: 4.3 mmol/L (ref 3.5–5.1)
Sodium: 139 mmol/L (ref 135–145)
TOTAL PROTEIN: 6 g/dL — AB (ref 6.5–8.1)

## 2017-11-24 LAB — URIC ACID: Uric Acid, Serum: <0.5 mg/dL — ABNORMAL LOW (ref 3.7–8.6)

## 2017-11-24 LAB — MAGNESIUM: Magnesium: 2.2 mg/dL (ref 1.7–2.4)

## 2017-11-24 MED ORDER — INSULIN ASPART 100 UNIT/ML ~~LOC~~ SOLN
0.0000 [IU] | Freq: Three times a day (TID) | SUBCUTANEOUS | Status: DC
  Administered 2017-11-24 – 2017-11-25 (×2): 2 [IU] via SUBCUTANEOUS
  Administered 2017-11-25: 1 [IU] via SUBCUTANEOUS

## 2017-11-24 NOTE — Progress Notes (Signed)
PROGRESS NOTE    Randall Reyes  GEX:[id number] DOB: 04-May-1936 DOA: 11/22/2017 PCP: [name], MD    Brief Narrative:  82 year old male who was admitted due to imminent tumor lysis syndrome. He does have the significant past medical history for CLL on chemotherapy.  He was found with significant leukocytosis, high risk for tumor lysis syndrome undergoing chemotherapy.  On admission his vital signs were stable, moist mucous membranes, lungs clear to auscultation, heart S1-S2 present rhythmic, abdomen soft nontender, no lower extremity edema.  Patient was admitted to the hospital working diagnosis of imminent tumor lysis syndrome in the setting of CLL.  Assessment & Plan:   Active Problems:   CLL (chronic lymphocytic leukemia) (HCC)   Melanoma in situ ([place])   DM (diabetes mellitus) ([place])   Malnutrition of moderate degree (HCC)   HTN (hypertension)   Tumor lysis syndrome   CKD (chronic kidney disease), stage III (HCC)   1. 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 Continue with allopurinol and gentle hydration with isotonic saline. Renal panel stable with serum cr at 1,56, K at 4,3, serum bicarbonate 22, phsphorus 4.2 and uric acid at less than 0.5. Continue ibrutinib.   2. Stage 3 CKD. Tolerating well isotonic saline IV, continue with renvela and sodium bicarbonate. Will check urine output. No signs of hypervolemia.  4. T2dm. Fasting glucose 114 this am, patient tolerating po well. At home on sitagliptin, will add insulin sliding scale.     5. Anemia sp one unit prbc transfusion. Will check cell count in am.   6. Seizures. On levetircetam.   7. Calorie protein malnutrition. (moderate). On nutritional supplements, with good toleration. Mild diarrhea, likely chemotherapy induced. Consult physical therapy.      DVT prophylaxis: enoxaparin   Code Status:  full Family Communication: I spoke with patient's wife at the bedside and all questions were  addressed.  Disposition Plan/ discharge barriers: pending uric acid levels and oncology recommendations, possible discharge 07/15.     Consultants:    Oncology   Procedures:     Antimicrobials  Subjective: Patient is feeling well, no dyspnea, no chest pain, positive diarrhea x2 bowel movements yesterday, no nausea or vomiting and tolerating po well.   Objective: Vitals:   11/23/17 1700 11/23/17 2012 11/23/17 2129 11/24/17 0623  BP: (!) 99/55  105/64 (!) 110/59  Pulse: 66  62 63  Resp: 16  16 16   Temp: (!) 97.5 F (36.4 C)  98 F (36.7 C) 97.7 F (36.5 C)  TempSrc: Oral  Oral Oral  SpO2: 96% 96% 96% 94%    Intake/Output Summary (Last 24 hours) at 11/24/2017 1030 Last data filed at 11/24/2017 0558 Gross per 24 hour  Intake 2167.5 ml  Output -  Net 2167.5 ml   There were no vitals filed for this visit.  Examination:   General: Not in pain or dyspnea, deconditioned  Neurology: Awake and alert, non focal  E ENT: mild pallor, no icterus, oral mucosa moist Cardiovascular: No JVD. S1-S2 present, rhythmic, no gallops, rubs, or murmurs. No lower extremity edema. Pulmonary: decreased breath sounds bilaterally, but adequate air movement, no wheezing, rhonchi or rales. Gastrointestinal. Abdomen with, no organomegaly, non tender, no rebound or guarding Skin. No rashes Musculoskeletal: no joint deformities     Data Reviewed: I have personally reviewed following labs and imaging studies  CBC: Recent Labs  Lab 11/22/17 0927 11/22/17 1903 11/23/17 0622  WBC 270.5* 281.6* 299.6*  NEUTROABS 8.1*  --   --  HGB 7.0* 5.7* 8.3*  HCT 22.6* 17.9* 26.5*  MCV 114.7* 112.6* 107.3*  PLT 58* 48* 46*   Basic Metabolic Panel: Recent Labs  Lab 11/22/17 0927 11/23/17 0622 11/24/17 0522  NA 135 140 139  K 4.3 4.5 4.3  CL 99 107 109  CO2 25 24 22   GLUCOSE 196* 119* 114*  BUN 36* 35* 36*  CREATININE 2.08* 1.80* 1.56*  CALCIUM 8.6* 8.2* 7.9*  MG  --  2.3 2.2  PHOS  2.9 4.1 4.2   GFR: Estimated Creatinine Clearance: 27.9 mL/min (A) (by C-G formula based on SCr of 1.56 mg/dL (H)). Liver Function Tests: Recent Labs  Lab 11/22/17 0927 11/23/17 0622 11/24/17 0522  AST 36 37 30  ALT 29 29 25   ALKPHOS 159* 138* 129*  BILITOT 0.5 0.7 0.6  PROT 7.1 6.4* 6.0*  ALBUMIN 2.5* 2.3* 2.2*   No results for input(s): LIPASE, AMYLASE in the last 168 hours. No results for input(s): AMMONIA in the last 168 hours. Coagulation Profile: No results for input(s): INR, PROTIME in the last 168 hours. Cardiac Enzymes: No results for input(s): CKTOTAL, CKMB, CKMBINDEX, TROPONINI in the last 168 hours. BNP (last 3 results) No results for input(s): PROBNP in the last 8760 hours. HbA1C: No results for input(s): HGBA1C in the last 72 hours. CBG: No results for input(s): GLUCAP in the last 168 hours. Lipid Profile: No results for input(s): CHOL, HDL, LDLCALC, TRIG, CHOLHDL, LDLDIRECT in the last 72 hours. Thyroid Function Tests: No results for input(s): TSH, T4TOTAL, FREET4, T3FREE, THYROIDAB in the last 72 hours. Anemia Panel: No results for input(s): VITAMINB12, FOLATE, FERRITIN, TIBC, IRON, RETICCTPCT in the last 72 hours.    Radiology Studies: I have reviewed all of the imaging during this hospital visit personally     Scheduled Meds: . allopurinol  300 mg Oral Daily  . feeding supplement (ENSURE ENLIVE)  237 mL Oral BID BM  . ibrutinib  280 mg Oral Daily  . levETIRAcetam  750 mg Oral BID  . sevelamer carbonate  800 mg Oral BID WC  . sodium bicarbonate  650 mg Oral Daily  . vitamin C  500 mg Oral Daily   Continuous Infusions: . sodium chloride 75 mL/hr at 11/24/17 1016     LOS: 2 days        Loreen Bankson Gerome Apley, MD [facility] Pager [phone]

## 2017-11-25 ENCOUNTER — Ambulatory Visit (HOSPITAL_COMMUNITY): Payer: Medicare Other | Admitting: Hematology

## 2017-11-25 ENCOUNTER — Other Ambulatory Visit (HOSPITAL_COMMUNITY): Payer: Medicare Other

## 2017-11-25 DIAGNOSIS — Z85820 Personal history of malignant melanoma of skin: Secondary | ICD-10-CM | POA: Diagnosis not present

## 2017-11-25 DIAGNOSIS — E883 Tumor lysis syndrome: Secondary | ICD-10-CM | POA: Diagnosis not present

## 2017-11-25 DIAGNOSIS — I10 Essential (primary) hypertension: Secondary | ICD-10-CM | POA: Diagnosis not present

## 2017-11-25 DIAGNOSIS — R599 Enlarged lymph nodes, unspecified: Secondary | ICD-10-CM | POA: Diagnosis not present

## 2017-11-25 DIAGNOSIS — Z950 Presence of cardiac pacemaker: Secondary | ICD-10-CM | POA: Diagnosis not present

## 2017-11-25 DIAGNOSIS — C9190 Lymphoid leukemia, unspecified not having achieved remission: Secondary | ICD-10-CM

## 2017-11-25 DIAGNOSIS — C9110 Chronic lymphocytic leukemia of B-cell type not having achieved remission: Secondary | ICD-10-CM | POA: Diagnosis not present

## 2017-11-25 DIAGNOSIS — Z87891 Personal history of nicotine dependence: Secondary | ICD-10-CM | POA: Diagnosis not present

## 2017-11-25 DIAGNOSIS — E119 Type 2 diabetes mellitus without complications: Secondary | ICD-10-CM | POA: Diagnosis not present

## 2017-11-25 DIAGNOSIS — Z79899 Other long term (current) drug therapy: Secondary | ICD-10-CM | POA: Diagnosis not present

## 2017-11-25 DIAGNOSIS — I4892 Unspecified atrial flutter: Secondary | ICD-10-CM | POA: Diagnosis not present

## 2017-11-25 DIAGNOSIS — E875 Hyperkalemia: Secondary | ICD-10-CM | POA: Diagnosis not present

## 2017-11-25 DIAGNOSIS — Z9221 Personal history of antineoplastic chemotherapy: Secondary | ICD-10-CM | POA: Diagnosis not present

## 2017-11-25 DIAGNOSIS — R61 Generalized hyperhidrosis: Secondary | ICD-10-CM | POA: Diagnosis not present

## 2017-11-25 LAB — GLUCOSE, CAPILLARY
GLUCOSE-CAPILLARY: 103 mg/dL — AB (ref 70–99)
GLUCOSE-CAPILLARY: 132 mg/dL — AB (ref 70–99)
Glucose-Capillary: 178 mg/dL — ABNORMAL HIGH (ref 70–99)

## 2017-11-25 LAB — COMPREHENSIVE METABOLIC PANEL
ALT: 22 U/L (ref 0–44)
AST: 23 U/L (ref 15–41)
Albumin: 2 g/dL — ABNORMAL LOW (ref 3.5–5.0)
Alkaline Phosphatase: 109 U/L (ref 38–126)
Anion gap: 6 (ref 5–15)
BUN: 38 mg/dL — AB (ref 8–23)
CALCIUM: 7.8 mg/dL — AB (ref 8.9–10.3)
CO2: 23 mmol/L (ref 22–32)
CREATININE: 1.39 mg/dL — AB (ref 0.61–1.24)
Chloride: 111 mmol/L (ref 98–111)
GFR calc non Af Amer: 46 mL/min — ABNORMAL LOW (ref >60)
GFR, EST AFRICAN AMERICAN: 54 mL/min — AB (ref >60)
GLUCOSE: 115 mg/dL — AB (ref 70–99)
Potassium: 4.8 mmol/L (ref 3.5–5.1)
SODIUM: 140 mmol/L (ref 135–145)
Total Bilirubin: 0.4 mg/dL (ref 0.3–1.2)
Total Protein: 5.6 g/dL — ABNORMAL LOW (ref 6.5–8.1)

## 2017-11-25 LAB — CBC WITH DIFFERENTIAL/PLATELET
BAND NEUTROPHILS: 0 %
BASOS PCT: 0 %
Basophils Absolute: 0 10*3/uL (ref 0.0–0.1)
Blasts: 0 %
EOS ABS: 0 10*3/uL (ref 0.0–0.7)
Eosinophils Relative: 0 %
HCT: 22 % — ABNORMAL LOW (ref 39.0–52.0)
HEMOGLOBIN: 7 g/dL — AB (ref 13.0–17.0)
Lymphocytes Relative: 96 %
Lymphs Abs: 288.7 10*3/uL — ABNORMAL HIGH (ref 0.7–4.0)
MCH: 34.1 pg — ABNORMAL HIGH (ref 26.0–34.0)
MCHC: 31.8 g/dL (ref 30.0–36.0)
MCV: 107.3 fL — ABNORMAL HIGH (ref 78.0–100.0)
METAMYELOCYTES PCT: 0 %
MONO ABS: 3 10*3/uL — AB (ref 0.1–1.0)
MYELOCYTES: 0 %
Monocytes Relative: 1 %
Neutro Abs: 9 10*3/uL — ABNORMAL HIGH (ref 1.7–7.7)
Neutrophils Relative %: 3 %
Other: 0 %
PROMYELOCYTES RELATIVE: 0 %
Platelets: 33 10*3/uL — ABNORMAL LOW (ref 150–400)
RBC: 2.05 MIL/uL — ABNORMAL LOW (ref 4.22–5.81)
RDW: 24.1 % — ABNORMAL HIGH (ref 11.5–15.5)
WBC: 300.7 10*3/uL (ref 4.0–10.5)
nRBC: 0 /100 WBC

## 2017-11-25 LAB — HEMOGLOBIN AND HEMATOCRIT, BLOOD
HEMATOCRIT: 30.5 % — AB (ref 39.0–52.0)
HEMOGLOBIN: 9.8 g/dL — AB (ref 13.0–17.0)

## 2017-11-25 LAB — PHOSPHORUS: Phosphorus: 3.2 mg/dL (ref 2.5–4.6)

## 2017-11-25 LAB — URIC ACID: Uric Acid, Serum: 0.9 mg/dL — ABNORMAL LOW (ref 3.7–8.6)

## 2017-11-25 LAB — PREPARE RBC (CROSSMATCH)

## 2017-11-25 LAB — MAGNESIUM: Magnesium: 2.1 mg/dL (ref 1.7–2.4)

## 2017-11-25 MED ORDER — IBRUTINIB 140 MG PO CAPS
140.0000 mg | ORAL_CAPSULE | Freq: Every day | ORAL | Status: DC
  Administered 2017-11-25: 140 mg via ORAL
  Filled 2017-11-25 (×3): qty 1

## 2017-11-25 MED ORDER — IBRUTINIB 140 MG PO CAPS
280.0000 mg | ORAL_CAPSULE | Freq: Every day | ORAL | Status: DC
  Filled 2017-11-25: qty 2

## 2017-11-25 MED ORDER — HEPARIN SOD (PORK) LOCK FLUSH 100 UNIT/ML IV SOLN
500.0000 [IU] | INTRAVENOUS | Status: AC | PRN
  Administered 2017-11-25: 500 [IU]
  Filled 2017-11-25: qty 5

## 2017-11-25 MED ORDER — SODIUM CHLORIDE 0.9% IV SOLUTION
Freq: Once | INTRAVENOUS | Status: AC
  Administered 2017-11-25: 12:00:00 via INTRAVENOUS

## 2017-11-25 NOTE — Plan of Care (Signed)
Pt resting in bed with eyes closed. NSD. Will continue to monitor.

## 2017-11-25 NOTE — Evaluation (Signed)
Physical Therapy Evaluation Patient Details Name: Randall Reyes MRN: [id number] DOB: 1935-09-29 Today's Date: 11/25/2017   History of Present Illness  Randall Reyes is a 82 y.o. male who is currently undergoing treatment for CLL with a WBC count of around 270,000 which is down from greater than 300,000 recently.  He is currently receiving Imbruvica.  He has no current complaints.  He is referred to [place] as a direct admission at the recommendation of oncology for prophylaxis against tumor lysis syndrome.    Clinical Impression  Patient functioning at baseline for functional mobility and gait.  Plan:  Patient discharged from physical therapy to care of nursing for ambulation daily as tolerated for length of stay.    Follow Up Recommendations No PT follow up    Equipment Recommendations  None recommended by PT    Recommendations for Other Services       Precautions / Restrictions Precautions Precautions: None Restrictions Weight Bearing Restrictions: No      Mobility  Bed Mobility Overal bed mobility: Independent                Transfers Overall transfer level: Independent                  Ambulation/Gait Ambulation/Gait assistance: Independent Gait Distance (Feet): 150 Feet Assistive device: None Gait Pattern/deviations: WFL(Within Functional Limits) Gait velocity: normal   General Gait Details: normal cadence, no loss of balance  Stairs            Wheelchair Mobility    Modified Rankin (Stroke Patients Only)       Balance Overall balance assessment: No apparent balance deficits (not formally assessed)                                           Pertinent Vitals/Pain Pain Assessment: 0-10    Home Living Family/patient expects to be discharged to:: Private residence Living Arrangements: Spouse/significant other Available Help at Discharge: Family;Available 24 hours/day Type of Home: House Home Access: Stairs  to enter Entrance Stairs-Rails: None Entrance Stairs-Number of Steps: 1 small step   Home Equipment: Cane - single point;Walker - 4 wheels;Walker - 2 wheels;Shower seat;Bedside commode      Prior Function Level of Independence: Independent         Comments: household ambulator without assistive device, uses SPC for community distances     Hand Dominance   Dominant Hand: Right    Extremity/Trunk Assessment   Upper Extremity Assessment Upper Extremity Assessment: Overall WFL for tasks assessed    Lower Extremity Assessment Lower Extremity Assessment: Overall WFL for tasks assessed    Cervical / Trunk Assessment Cervical / Trunk Assessment: Normal  Communication   Communication: No difficulties  Cognition Arousal/Alertness: Awake/alert Behavior During Therapy: WFL for tasks assessed/performed Overall Cognitive Status: Within Functional Limits for tasks assessed                                        General Comments      Exercises     Assessment/Plan    PT Assessment Patent does not need any further PT services  PT Problem List         PT Treatment Interventions      PT Goals (Current goals can be found in the  Care Plan section)  Acute Rehab PT Goals Patient Stated Goal: return home PT Goal Formulation: With patient/family Time For Goal Achievement: December 02, 2017 Potential to Achieve Goals: Good    Frequency     Barriers to discharge        Co-evaluation               AM-PAC PT "6 Clicks" Daily Activity  Outcome Measure Difficulty turning over in bed (including adjusting bedclothes, sheets and blankets)?: None Difficulty moving from lying on back to sitting on the side of the bed? : None Difficulty sitting down on and standing up from a chair with arms (e.g., wheelchair, bedside commode, etc,.)?: None Help needed moving to and from a bed to chair (including a wheelchair)?: None Help needed walking in hospital room?: None Help  needed climbing 3-5 steps with a railing? : None 6 Click Score: 24    End of Session   Activity Tolerance: Patient tolerated treatment well Patient left: in bed;with call bell/phone within reach;with family/visitor present Nurse Communication: Mobility status PT Visit Diagnosis: Unsteadiness on feet (R26.81);Other abnormalities of gait and mobility (R26.89);Muscle weakness (generalized) (M62.81)    Time: 6195-0932 PT Time Calculation (min) (ACUTE ONLY): 23 min   Charges:   PT Evaluation $PT Eval Moderate Complexity: 1 Mod PT Treatments $Therapeutic Activity: 23-37 mins   PT G Codes:        10:41 AM, 12/02/2017 Lonell Grandchild, MPT Physical Therapist with [facility] [phone] office [phone] mobile phone

## 2017-11-25 NOTE — Care Management Note (Signed)
Case Management Note  Patient Details  Name: Randall Reyes MRN: [id number] Date of Birth: 19-Jul-1935  Subjective/Objective:           Admitted with CL with tumor lysis syndrome. Chart reviewed for potential needs. Pt from home, he lives with his spouse. He is ind with ADL's. He has cane, walker, BSC. He was able to ambulate 169ft with PT, they recommends no PT follow up. Pt has insurance with drug coverage, he has PCP. Pt will have quick follow up with OP oncologist.          Action/Plan: DC home today. No CM needs noted at this time.   Expected Discharge Date:  11/25/17               Expected Discharge Plan:  Home/Self Care  In-House Referral:  NA  Discharge planning Services  CM Consult  Post Acute Care Choice:  NA Choice offered to:  NA  Status of Service:  Completed, signed off  If discussed at Long Length of Stay Meetings, dates discussed:    Additional Comments:  Sherald Barge, RN 11/25/2017, 1:54 PM

## 2017-11-25 NOTE — Care Management Important Message (Signed)
Important Message  Patient Details  Name: Randall Reyes MRN: [id number] Date of Birth: [birth year]   Medicare Important Message Given:  Yes    [name] 11/25/2017, 11:45 AM

## 2017-11-25 NOTE — Discharge Summary (Signed)
Physician Discharge Summary  Randall Reyes ESP:[id number] DOB: 07-27-1935 DOA: 11/22/2017  PCP: [name], MD  Admit date: 11/22/2017  Discharge date: 11/25/2017  Admitted From:Home  Disposition:  Home  Recommendations for Outpatient Follow-up:  1. Follow up with PCP in 1-2 weeks 2. Follow up with Dr. Lamonte Richer as scheduled in clinic tomorrow  Home Health:N/A  Equipment/Devices:N/A  Discharge Condition:Stable  CODE STATUS: Full  Diet recommendation: Heart Healthy  Brief/Interim Summary: This is an 82 year old male who was admitted due to imminent tumor lysis syndrome. He does have the significant past medical history for CLLon chemotherapy.  He has undergone continue treatment with allopurinol as well as gentle hydration with isotonic saline which he has tolerated well.  He has received a 1 unit PRBC transfusion due to severe anemia and has undergone a repeat transfusion on day of discharge due to persistent anemia.  No overt bleeding has been identified and he is otherwise in stable condition and had his usual baseline level of functioning.  I have spoken with his oncologist Dr. Delton Coombes who is in agreement with this plan and will follow up with the patient in the next 1 to 2 days in the outpatient setting.   Discharge Diagnoses:  Active Problems:   CLL (chronic lymphocytic leukemia) (HCC)   Melanoma in situ ([place])   DM (diabetes mellitus) ([place])   Malnutrition of moderate degree (HCC)   HTN (hypertension)   Tumor lysis syndrome   CKD (chronic kidney disease), stage III (HCC)  1. 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  Laboratory data improved and patient well-hydrated.  Continue allopurinol at home as well as continued chemotherapy regimen per oncology.  We will follow-up with oncology in the outpatient setting as scheduled. 2. Stage III CKD.  Patient is tolerated IV fluid hydration well and is currently at baseline. 3. Type 2 diabetes.  Well  controlled. 4. Anemia.  Improved after total 2 unit PRBC transfusion during admission.  Related to above. 5. Seizures.  Continue levetircetam. 6. Moderate protein calorie malnutrition.  Continue nutritional supplementation.  Discharge Instructions  Discharge Instructions    Diet - low sodium heart healthy   Complete by:  As directed    Increase activity slowly   Complete by:  As directed      Allergies as of 11/25/2017      Reactions   Aspirin Other (See Comments)   Bleeding ulcers.    Ibuprofen Other (See Comments)   Bleeding ulcers.      Medication List    STOP taking these medications   doxycycline 100 MG tablet Commonly known as:  VIBRA-TABS     TAKE these medications   ACCU-CHEK AVIVA PLUS test strip Generic drug:  glucose blood   ACCU-CHEK SOFTCLIX LANCETS lancets   allopurinol 100 MG tablet Commonly known as:  ZYLOPRIM Take 1 tablet (100 mg total) by mouth daily.   cholecalciferol 400 units Tabs tablet Commonly known as:  VITAMIN D Take 400 Units by mouth daily.   feeding supplement (ENSURE ENLIVE) Liqd Take 237 mLs by mouth 2 (two) times daily between meals.   ibrutinib 140 MG capsul Commonly known as:  IMBRUVICA Take 140 mg PO daily x 7 days and then increase to 280 mg PO daily thereafter What changed:    how much to take  how to take this  when to take this  additional instructions   levETIRAcetam 100 MG/ML solution Commonly known as:  KEPPRA Take 7.5 mLs (750 mg total)  by mouth 2 (two) times daily.   magic mouthwash w/lidocaine Soln Take 5 mLs by mouth 4 (four) times daily as needed for mouth pain.   rosuvastatin 40 MG tablet Commonly known as:  CRESTOR Take 40 mg by mouth at bedtime.   sevelamer carbonate 800 MG tablet Commonly known as:  RENVELA Take 2 tablets (1,600 mg total) by mouth 3 (three) times daily with meals. What changed:    how much to take  when to take this   sitaGLIPtin 100 MG tablet Commonly known as:   JANUVIA Take 100 mg by mouth daily.   sodium bicarbonate 650 MG tablet TAKE 1 TABLET BY MOUTH TWICE A DAY AS DIRECTED   vitamin C 500 MG tablet Commonly known as:  ASCORBIC ACID Take 500 mg by mouth daily.      Follow-up Information    [name], MD Follow up in 1 week(s).   Specialty:  Internal Medicine Contact information: [place] [facility] [address] [phone]        Herminio Commons, MD .   Specialty:  Cardiology Contact information: [place] [address] [phone]        Derek Jack, MD Follow up in 1 day(s).   Specialty:  Hematology Contact information: [place] [address] [phone]          Allergies  Allergen Reactions  . Aspirin Other (See Comments)    Bleeding ulcers.   . Ibuprofen Other (See Comments)    Bleeding ulcers.    Consultations:  Oncology Dr. Lamonte Richer   Procedures/Studies: Dg Chest 2 View  Result Date: 11/16/2017 CLINICAL DATA:  Cough.  History of left upper lobe pneumonia. EXAM: CHEST - 2 VIEW COMPARISON:  PA and lateral chest 10/04/2017 and 10/25/2017. FINDINGS: The lungs are emphysematous. There is new airspace disease in the right middle lobe. Airspace opacity in the left upper lobe has improved but is still seen. Heart size is normal. Port-A-Cath and pacing device are in place. No pneumothorax. Heart size is normal. Aortic atherosclerosis is noted. No acute bony abnormality. Remote compression fractures at the thoracolumbar junction noted. IMPRESSION: New right middle lobe airspace disease most worrisome for pneumonia. Left upper lobe airspace disease seen on the comparison examinations has improved but persists. Recommend follow-up to clearing. Emphysema. Atherosclerosis. Electronically Signed   By: Inge Rise M.D.   On: 11/16/2017 11:13     Discharge Exam: Vitals:   11/25/17 1222 11/25/17 1237  BP: (!) 110/58 99/62  Pulse: (!) 58 61  Resp: 17 18   Temp: 97.8 F (36.6 C) (!) 97.5 F (36.4 C)  SpO2: 93%    Vitals:   11/24/17 2219 11/25/17 0604 11/25/17 1222 11/25/17 1237  BP:  109/68 (!) 110/58 99/62  Pulse:  62 (!) 58 61  Resp:  16 17 18   Temp:  97.7 F (36.5 C) 97.8 F (36.6 C) (!) 97.5 F (36.4 C)  TempSrc:  Oral Oral Oral  SpO2: 98% 95% 93%     General: Pt is alert, awake, not in acute distress Cardiovascular: RRR, S1/S2 +, no rubs, no gallops Respiratory: CTA bilaterally, no wheezing, no rhonchi Abdominal: Soft, NT, ND, bowel sounds + Extremities: no edema, no cyanosis    The results of significant diagnostics from this hospitalization (including imaging, microbiology, ancillary and laboratory) are listed below for reference.     Microbiology: No results found for this or any previous visit (from the past 240  hour(s)).   Labs: BNP (last 3 results) No results for input(s): BNP in the last 8760 hours. Basic Metabolic Panel: Recent Labs  Lab 11/22/17 0927 11/23/17 0622 11/24/17 0522 11/25/17 0659  NA 135 140 139 140  K 4.3 4.5 4.3 4.8  CL 99 107 109 111  CO2 25 24 22 23   GLUCOSE 196* 119* 114* 115*  BUN 36* 35* 36* 38*  CREATININE 2.08* 1.80* 1.56* 1.39*  CALCIUM 8.6* 8.2* 7.9* 7.8*  MG  --  2.3 2.2 2.1  PHOS 2.9 4.1 4.2 3.2   Liver Function Tests: Recent Labs  Lab 11/22/17 0927 11/23/17 0622 11/24/17 0522 11/25/17 0659  AST 36 37 30 23  ALT 29 29 25 22   ALKPHOS 159* 138* 129* 109  BILITOT 0.5 0.7 0.6 0.4  PROT 7.1 6.4* 6.0* 5.6*  ALBUMIN 2.5* 2.3* 2.2* 2.0*   No results for input(s): LIPASE, AMYLASE in the last 168 hours. No results for input(s): AMMONIA in the last 168 hours. CBC: Recent Labs  Lab 11/22/17 0927 11/22/17 1903 11/23/17 0622 11/25/17 0659  WBC 270.5* 281.6* 299.6* 300.7*  NEUTROABS 8.1*  --   --  9.0*  HGB 7.0* 5.7* 8.3* 7.0*  HCT 22.6* 17.9* 26.5* 22.0*  MCV 114.7* 112.6* 107.3* 107.3*  PLT 58* 48* 46* 33*   Cardiac Enzymes: No results for input(s): CKTOTAL,  CKMB, CKMBINDEX, TROPONINI in the last 168 hours. BNP: Invalid input(s): POCBNP CBG: Recent Labs  Lab 11/24/17 1649 11/24/17 2057 11/25/17 0720 11/25/17 1137  GLUCAP 152* 115* 103* 132*   D-Dimer No results for input(s): DDIMER in the last 72 hours. Hgb A1c No results for input(s): HGBA1C in the last 72 hours. Lipid Profile No results for input(s): CHOL, HDL, LDLCALC, TRIG, CHOLHDL, LDLDIRECT in the last 72 hours. Thyroid function studies No results for input(s): TSH, T4TOTAL, T3FREE, THYROIDAB in the last 72 hours.  Invalid input(s): FREET3 Anemia work up No results for input(s): VITAMINB12, FOLATE, FERRITIN, TIBC, IRON, RETICCTPCT in the last 72 hours. Urinalysis    Component Value Date/Time   COLORURINE YELLOW 10/04/2017 [place] 10/04/2017 1755   LABSPEC 1.008 10/04/2017 1755   PHURINE 6.0 10/04/2017 1755   GLUCOSEU NEGATIVE 10/04/2017 1755   HGBUR NEGATIVE 10/04/2017 [place] 10/04/2017 [place] 10/04/2017 1755   PROTEINUR NEGATIVE 10/04/2017 1755   UROBILINOGEN 0.2 02/11/2015 1718   NITRITE NEGATIVE 10/04/2017 1755   LEUKOCYTESUR NEGATIVE 10/04/2017 1755   Sepsis Labs Invalid input(s): PROCALCITONIN,  WBC,  LACTICIDVEN Microbiology No results found for this or any previous visit (from the past 240 hour(s)).   Time coordinating discharge: 45 minutes  SIGNED:   Rodena Goldmann, DO [facility] 11/25/2017, 1:41 PM Pager [phone]  If 7PM-7AM, please contact night-coverage [email or web address] Password [email or web address]

## 2017-11-26 ENCOUNTER — Inpatient Hospital Stay (HOSPITAL_COMMUNITY): Payer: Medicare Other

## 2017-11-26 ENCOUNTER — Other Ambulatory Visit: Payer: Self-pay

## 2017-11-26 ENCOUNTER — Inpatient Hospital Stay (HOSPITAL_BASED_OUTPATIENT_CLINIC_OR_DEPARTMENT_OTHER): Payer: Medicare Other | Admitting: Hematology

## 2017-11-26 ENCOUNTER — Encounter (HOSPITAL_COMMUNITY): Payer: Self-pay | Admitting: Hematology

## 2017-11-26 VITALS — BP 103/61 | HR 86 | Temp 98.4°F | Resp 18 | Wt 121.7 lb

## 2017-11-26 DIAGNOSIS — C9110 Chronic lymphocytic leukemia of B-cell type not having achieved remission: Secondary | ICD-10-CM

## 2017-11-26 DIAGNOSIS — E875 Hyperkalemia: Secondary | ICD-10-CM | POA: Diagnosis not present

## 2017-11-26 DIAGNOSIS — Z9221 Personal history of antineoplastic chemotherapy: Secondary | ICD-10-CM

## 2017-11-26 DIAGNOSIS — R61 Generalized hyperhidrosis: Secondary | ICD-10-CM

## 2017-11-26 DIAGNOSIS — E883 Tumor lysis syndrome: Secondary | ICD-10-CM

## 2017-11-26 DIAGNOSIS — Z85820 Personal history of malignant melanoma of skin: Secondary | ICD-10-CM

## 2017-11-26 DIAGNOSIS — Z950 Presence of cardiac pacemaker: Secondary | ICD-10-CM | POA: Diagnosis not present

## 2017-11-26 DIAGNOSIS — R599 Enlarged lymph nodes, unspecified: Secondary | ICD-10-CM

## 2017-11-26 DIAGNOSIS — I4892 Unspecified atrial flutter: Secondary | ICD-10-CM | POA: Diagnosis not present

## 2017-11-26 DIAGNOSIS — Z87891 Personal history of nicotine dependence: Secondary | ICD-10-CM | POA: Diagnosis not present

## 2017-11-26 DIAGNOSIS — Z79899 Other long term (current) drug therapy: Secondary | ICD-10-CM

## 2017-11-26 DIAGNOSIS — R634 Abnormal weight loss: Secondary | ICD-10-CM | POA: Diagnosis not present

## 2017-11-26 DIAGNOSIS — E119 Type 2 diabetes mellitus without complications: Secondary | ICD-10-CM | POA: Diagnosis not present

## 2017-11-26 DIAGNOSIS — I10 Essential (primary) hypertension: Secondary | ICD-10-CM | POA: Diagnosis not present

## 2017-11-26 DIAGNOSIS — E669 Obesity, unspecified: Secondary | ICD-10-CM | POA: Diagnosis not present

## 2017-11-26 LAB — CBC WITH DIFFERENTIAL/PLATELET
BLASTS: 0 %
Band Neutrophils: 0 %
Basophils Absolute: 0 10*3/uL (ref 0.0–0.1)
Basophils Relative: 0 %
EOS PCT: 0 %
Eosinophils Absolute: 0 10*3/uL (ref 0.0–0.7)
HEMATOCRIT: 29.2 % — AB (ref 39.0–52.0)
HEMOGLOBIN: 9.6 g/dL — AB (ref 13.0–17.0)
LYMPHS ABS: 327.9 10*3/uL — AB (ref 0.7–4.0)
Lymphocytes Relative: 98 %
MCH: 34.2 pg — ABNORMAL HIGH (ref 26.0–34.0)
MCHC: 32.9 g/dL (ref 30.0–36.0)
MCV: 103.9 fL — AB (ref 78.0–100.0)
MONOS PCT: 0 %
Metamyelocytes Relative: 0 %
Monocytes Absolute: 0 10*3/uL — ABNORMAL LOW (ref 0.1–1.0)
Myelocytes: 0 %
NEUTROS ABS: 6.7 10*3/uL (ref 1.7–7.7)
NEUTROS PCT: 2 %
NRBC: 0 /100{WBCs}
Other: 0 %
Platelets: 33 10*3/uL — ABNORMAL LOW (ref 150–400)
Promyelocytes Relative: 0 %
RBC: 2.81 MIL/uL — AB (ref 4.22–5.81)
RDW: 24 % — AB (ref 11.5–15.5)
WBC: 334.6 10*3/uL — AB (ref 4.0–10.5)

## 2017-11-26 LAB — TYPE AND SCREEN
ABO/RH(D): AB POS
ANTIBODY SCREEN: NEGATIVE
UNIT DIVISION: 0
UNIT DIVISION: 0
UNIT DIVISION: 0

## 2017-11-26 LAB — BPAM RBC
BLOOD PRODUCT EXPIRATION DATE: 201908082359
Blood Product Expiration Date: 201907262359
Blood Product Expiration Date: 201908092359
ISSUE DATE / TIME: 201907122049
ISSUE DATE / TIME: 201907151158
UNIT TYPE AND RH: 6200
Unit Type and Rh: 6200
Unit Type and Rh: 6200

## 2017-11-26 LAB — BASIC METABOLIC PANEL
ANION GAP: 6 (ref 5–15)
BUN: 37 mg/dL — ABNORMAL HIGH (ref 8–23)
CHLORIDE: 107 mmol/L (ref 98–111)
CO2: 27 mmol/L (ref 22–32)
Calcium: 8.6 mg/dL — ABNORMAL LOW (ref 8.9–10.3)
Creatinine, Ser: 1.38 mg/dL — ABNORMAL HIGH (ref 0.61–1.24)
GFR calc non Af Amer: 47 mL/min — ABNORMAL LOW (ref >60)
GFR, EST AFRICAN AMERICAN: 54 mL/min — AB (ref >60)
Glucose, Bld: 151 mg/dL — ABNORMAL HIGH (ref 70–99)
POTASSIUM: 5.2 mmol/L — AB (ref 3.5–5.1)
SODIUM: 140 mmol/L (ref 135–145)

## 2017-11-26 LAB — PHOSPHORUS: Phosphorus: 3.1 mg/dL (ref 2.5–4.6)

## 2017-11-26 LAB — MAGNESIUM: Magnesium: 2 mg/dL (ref 1.7–2.4)

## 2017-11-26 LAB — LACTATE DEHYDROGENASE: LDH: 186 U/L (ref 98–192)

## 2017-11-26 MED ORDER — SODIUM POLYSTYRENE SULFONATE 15 GM/60ML PO SUSP
30.0000 g | Freq: Once | ORAL | Status: AC
  Filled 2017-11-26: qty 120

## 2017-11-26 MED ORDER — SODIUM POLYSTYRENE SULFONATE PO POWD: Freq: Once | ORAL | 0 refills | Status: AC

## 2017-11-26 NOTE — Assessment & Plan Note (Addendum)
1. Stage IV CLL with history of poor tolerance to past chemotherapies: -Could not tolerate past treatments including FCR in 2010, Imbruvica 2015 (located by large leg rash), Zydelig 0034 (complicated by transaminitis despite dose reduction), Imbruvica rechallenge in 9179 (complicated by infection and progressive fatigue), bendamustine/Rituxan 1 cycle in 1505 (complicated by prolonged and significant cytopenia in addition to tumor lysis syndrome and renal failure).  He could not even tolerate last treatment with obinutuzumab and chlorambucil. -He reports recent onset night sweats, tiredness on exertion and 15 pound weight loss in the last 1 month. -Ibrutinib 140 mg daily started on 11/22/2017 after TLS prophylaxis with rasburicase.  He was hospitalized for the next 2 days for observation with continuous IV fluids and lab monitoring.  He did not develop any tumor lysis syndrome.  He received 1 unit of blood transfusion yesterday prior to discharge. - He reports that he is feeling well.  Denies any nausea vomiting or diarrhea.  Will continue to monitor his labs closely today and on Thursday.  I will see him back early next week.  He has developed mild hyperkalemia with potassium of 5.2.  We will give him Kayexalate.   2.  Left temporal melanoma: -This was reportedly biopsied by Dr. [place] Crane few months ago and was thought to be melanoma.  Patient developed left preauricular adenopathy in the last few weeks.  Wife also reports that the temporal nodular lesion is increasing in the last 2 to 3 weeks.  We will address this once he is safely able to manage Ibrutinib.  3. TLS prevention: - He received rasburicase.  He was also hospitalized for 2 days with continuous lab monitoring and IV fluids.  He did not develop any tumor lysis syndrome.

## 2017-11-26 NOTE — Progress Notes (Signed)
CRITICAL VALUE ALERT Critical value received:  WBC-334.6 Date of notification:  11/26/17 Time of notification: 0949 Critical value read back:  Yes.   Nurse who received alert:  M.Bridgette Wolden, LPN MD notified (1st page):  S.Katragadda, MD

## 2017-11-26 NOTE — Patient Instructions (Signed)
[facility] at [facility] Discharge Instructions  Today you saw Dr. K.   Thank you for choosing [facility] at [facility] to provide your oncology and hematology care.  To afford each patient quality time with our provider, please arrive at least 15 minutes before your scheduled appointment time.   If you have a lab appointment with the Cancer Center please come in thru the  Main Entrance and check in at the main information desk  You need to re-schedule your appointment should you arrive 10 or more minutes late.  We strive to give you quality time with our providers, and arriving late affects you and other patients whose appointments are after yours.  Also, if you no show three or more times for appointments you may be dismissed from the clinic at the providers discretion.     Again, thank you for choosing [facility].  Our hope is that these requests will decrease the amount of time that you wait before being seen by our physicians.       _____________________________________________________________  Should you have questions after your visit to [facility], please contact our office at [phone] between the hours of 8:30 a.m. and 4:30 p.m.  Voicemails left after 4:30 p.m. will not be returned until the following business day.  For prescription refill requests, have your pharmacy contact our office.       Resources For Cancer Patients and their Caregivers ? American Cancer Society: Can assist with transportation, wigs, general needs, runs Look Good Feel Better.        1-888-227-6333 ? Cancer Care: Provides financial assistance, online support groups, medication/co-pay assistance.  1-800-813-HOPE (4673) ? [facility] Assists [place] cancer patients and their families through emotional , educational and financial support.  336-427-4357 ? [place] DSS Where to apply for food  stamps, Medicaid and utility assistance. 336-342-1394 ? [facility]: Transportation to medical appointments. 336-347-2287 ? Social Security Administration: May apply for disability if have a Stage IV cancer. 336-342-7796 1-800-772-1213 ? [place] Aging, Disability and Transit Services: Assists with nutrition, care and transit needs. 336-349-2343  Cancer Center Support Programs:   > Cancer Support Group  2nd Tuesday of the month 1pm-2pm, Journey Room   > Creative Journey  3rd Tuesday of the month 1130am-1pm, Journey Room    

## 2017-11-26 NOTE — Addendum Note (Signed)
Addended by: Glennie Isle on: 11/26/2017 11:52 AM   Modules accepted: Orders

## 2017-11-26 NOTE — Addendum Note (Signed)
Addended by: Glennie Isle on: 11/26/2017 01:26 PM   Modules accepted: Orders

## 2017-11-26 NOTE — Progress Notes (Signed)
Randall Reyes, Randall Reyes [id number]   CLINIC:  Medical Oncology/Hematology  PCP:  [name], MD [place] [address] [phone]   REASON FOR VISIT:  Follow-up for Chronic Lymphocytic Leukemia  CURRENT THERAPY: Ibrutinib 140mg  with allopurinol 300mg  daily  BRIEF ONCOLOGIC HISTORY:    CLL (chronic lymphocytic leukemia) ([place])   02/14/2005 Procedure    Peripheral flow cytometry- CLL (without 11q or 17 p deletion) presenting with stage 0 disease      10/25/2008 Progression    Increasing WBC and symptoms suggestive of progressive disease      11/09/2008 - 01/07/2009 Chemotherapy    Rituxan, fludarabine, cytoxan x 3 cycles with the discontinuation of fludarabine and cytoxan due to severe pancytopenia requiring hospitalization.      01/10/2009 Adverse Reaction    Severe pancytopenia and adverse side effects      02/01/2009 - 04/01/2009 Chemotherapy    Rituxan every 21 days x 3 cycles      04/02/2009 Remission    Stable disease.  Chemotherapy holiday started      06/22/2013 - 06/29/2013 Chemotherapy    Imbruvica daily      06/29/2013 Adverse Reaction    Large right posterior leg (popliteal) ecchymosis/violaceous rash.  Likely Imbruvica-induced      07/31/2013 - 10/12/2013 Chemotherapy    Zydelig (idelalisib) 150 mg BID monotherapy      10/12/2013 Adverse Reaction    Transaminitis, suspected to be secondary to [place].  Dose decreased.      10/12/2013 - 11/09/2013 Chemotherapy    Zydelig (idelalisib) 150 mg daily.      11/09/2013 Adverse Reaction    Worsening transaminitis.  Zydelig on HOLD      11/09/2013 Treatment Plan Change    Hold Zydelig      12/28/2013 Adverse Reaction    Herpes Zoster outbreak.  Partial response with Famvir x 1 week, switched to acyclovir.  Progression over 1-2 weeks with development of keratoconjunctivitis bilaterally.  VZ Immune Globulin 625 mg IM given on 01/13/14.      02/08/2014 Treatment  Plan Change    Ibrutinib restarted 140 mg daily for one week followed by 280 mg daily followed by 420 mg daily with prophylactic acyclovir.      02/08/2014 - 07/05/2014 Chemotherapy    Ibrutinib 420 mg daily      108-30-2015 - 04/17/2014 Hospital Admission    Right lower lobe pneumonia      07/05/2014 Adverse Reaction    Fissuring of fingertips and sore, red feet with transaminitis      07/05/2014 Treatment Plan Change    Hold Ibrutinib      07/22/2014 - 08/19/2014 Chemotherapy    Ibrutinib restarted at 280 mg daily.      08/09/2014 - 08/12/2014 Hospital Admission    Bilateral pneumonia      08/19/2014 Adverse Reaction    Nausea with vomiting.  Escorted to ED.      08/19/2014 Treatment Plan Change    Ibrutinib on hold- ED escort from clinic       10/18/2014 Miscellaneous    B12 weekly x 4 then monthly.      12/15/2015 Imaging    Bone density- BMD as determined from Femur Neck Left is 0.803 g/cm2 with a T-score of -2.1. This patient is considered osteopenic by World Healh Organization (WHO) Criteria.      12/16/2015 Imaging    CT abd/pelvis-  Progression of retroperitoneal and portacaval  adenopathy compared to the prior study. Likely related to CLL.  Small hiatal hernia  Mild compression fractures T11 and T12, probably old. Correlate with any pain in this area.      05/22/2016 - 05/28/2016 Chemotherapy    BR day 1 and 2 x 1 cycle.  Bendamustine dose reduced by 50%.      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      01/28/2017 -  Chemotherapy    Cycle 1 day 1 of Gayzva and Chlorambucil. Rasburicase 3 mg given also.       01/28/2017 - 02/08/2017 Hospital Admission    Admitted for sycopal episode due to complete heart block. Patient went into PEA arrest requiring CPR  and intubation. 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 Pacemaker placed while inpatient. Started on eliquis.       INTERVAL HISTORY:  [name] 82 y.o. male returns for routine follow-up for chronic lymphocytic leukemia. Patient was given a dose of rasburicase 6mg  and started on Ibrutinib. He was admitted to the hospital for 2 day observation and IV fluids and labs. Patient was given blood yesterday before discharge. He is doing well today. Wife stated he slept well last night. Denies any new pains. Denies any fevers. Denies any nausea, vomiting, or diarrhea. His overall energy and appetite are remaining around 25%. He will return for labs later this week and next to keep a close eye on them.      REVIEW OF SYSTEMS:  Review of Systems  Constitutional: Positive for fatigue.  HENT:  Negative.   Eyes: Negative.   Respiratory: Negative.   Cardiovascular: Negative.   Gastrointestinal: Negative.   Endocrine: Negative.   Genitourinary: Negative.    Musculoskeletal: Negative.   Skin: Negative.   Neurological: Negative.   Hematological: Negative.   Psychiatric/Behavioral: Negative.      PAST MEDICAL/SURGICAL HISTORY:  Past Medical History:  Diagnosis Date  . Anemia   . B12 deficiency 12/07/2014  . CLL (chronic lymphocytic leukemia) ([place]) 01/24/2011  . Diabetes mellitus   . DM (diabetes mellitus) ([place]) 01/24/2011  . Eczema 01/24/2011  . History of pneumonia 04/2014  . Hypertension   . Leukemia ([place]) 6.22.2012   PER PATIENT  . Melanoma in situ ([place]) 01/24/2011  . Mild obesity 01/24/2011  . Port catheter in place 05/02/2012  . Shingles   . Ulcer    Past Surgical History:  Procedure Laterality Date  . bleeding ulcer    . CATARACT EXTRACTION W/PHACO  05/05/2012   Procedure: CATARACT EXTRACTION PHACO AND INTRAOCULAR LENS PLACEMENT (IOC);  Surgeon: Tonny Branch, MD;  Location: [facility];  Service: Ophthalmology;  Laterality: Right;  CDE:13.25  . CATARACT  EXTRACTION W/PHACO  05/15/2012   Procedure: CATARACT EXTRACTION PHACO AND INTRAOCULAR LENS PLACEMENT (IOC);  Surgeon: Tonny Branch, MD;  Location: [facility];  Service: Ophthalmology;  Laterality: Left;  CDE:  12.32  . COLONOSCOPY N/A 01/30/2016   Procedure: COLONOSCOPY;  Surgeon: Danie Binder, MD;  Location: [facility];  Service: Endoscopy;  Laterality: N/A;  2:00 PM - moved to 1:00 - office notified pt  . ESOPHAGOGASTRODUODENOSCOPY  2008   Dr. Oneida Alar: normal esophagus, antral erythema, 1 cm clean based duodenal ulcera, negative H. plyori  . ESOPHAGOGASTRODUODENOSCOPY N/A 01/30/2016   Procedure: ESOPHAGOGASTRODUODENOSCOPY (EGD);  Surgeon: Danie Binder,  MD;  Location: [facility];  Service: Endoscopy;  Laterality: N/A;  . HERNIA REPAIR  2001  . PACEMAKER IMPLANT N/A 02/07/2017   Procedure: PACEMAKER IMPLANT;  Surgeon: Constance Haw, MD;  Location: [facility];  Service: Cardiovascular;  Laterality: N/A;  . PORTACATH PLACEMENT  2008     SOCIAL HISTORY:  Social History   Socioeconomic History  . Marital status: Married    Spouse name: Not on file  . Number of children: Not on file  . Years of education: Not on file  . Highest education level: Not on file  Occupational History  . Not on file  Social Needs  . Financial resource strain: Not on file  . Food insecurity:    Worry: Not on file    Inability: Not on file  . Transportation needs:    Medical: Not on file    Non-medical: Not on file  Tobacco Use  . Smoking status: Former Smoker    Last attempt to quit: 09/11/1977    Years since quitting: 40.2  . Smokeless tobacco: Former Systems developer    Quit date: 04/25/1967  Substance and Sexual Activity  . Alcohol use: No  . Drug use: No  . Sexual activity: Not on file  Lifestyle  . Physical activity:    Days per week: Not on file    Minutes per session: Not on file  . Stress: Not on file  Relationships  . Social connections:    Talks on phone: Not on file    Gets together:  Not on file    Attends religious service: Not on file    Active member of club or organization: Not on file    Attends meetings of clubs or organizations: Not on file    Relationship status: Not on file  . Intimate partner violence:    Fear of current or ex partner: Not on file    Emotionally abused: Not on file    Physically abused: Not on file    Forced sexual activity: Not on file  Other Topics Concern  . Not on file  Social History Narrative  . Not on file    FAMILY HISTORY:  Family History  Problem Relation Age of Onset  . Diabetes Father   . Colon cancer Neg Hx     CURRENT MEDICATIONS:  Outpatient Encounter Medications as of 11/26/2017  Medication Sig  . ACCU-CHEK AVIVA PLUS test strip   . ACCU-CHEK SOFTCLIX LANCETS lancets   . allopurinol (ZYLOPRIM) 100 MG tablet Take 1 tablet (100 mg total) by mouth daily.  . cholecalciferol (VITAMIN D) 400 units TABS tablet Take 400 Units by mouth daily.  . feeding supplement, ENSURE ENLIVE, (ENSURE ENLIVE) LIQD Take 237 mLs by mouth 2 (two) times daily between meals.  . ibrutinib (IMBRUVICA) 140 MG capsul Take 140 mg PO daily x 7 days and then increase to 280 mg PO daily thereafter (Patient taking differently: Take 140-280 mg by mouth See admin instructions. Take 140 mg  daily x 7 days and then increase to 280 mg daily thereafter starting on 11/22/2017)  . levETIRAcetam (KEPPRA) 100 MG/ML solution Take 7.5 mLs (750 mg total) by mouth 2 (two) times daily.  . magic mouthwash w/lidocaine SOLN Take 5 mLs by mouth 4 (four) times daily as needed for mouth pain.  . rosuvastatin (CRESTOR) 40 MG tablet Take 40 mg by mouth at bedtime.   . sevelamer carbonate (RENVELA) 800 MG tablet Take 2 tablets (1,600 mg total)  by mouth 3 (three) times daily with meals. (Patient taking differently: Take 800 mg by mouth 2 (two) times daily with a meal. )  . sitaGLIPtin (JANUVIA) 100 MG tablet Take 100 mg by mouth daily.  . sodium bicarbonate 650 MG tablet TAKE 1  TABLET BY MOUTH TWICE A DAY AS DIRECTED  . vitamin C (ASCORBIC ACID) 500 MG tablet Take 500 mg by mouth daily.   Facility-Administered Encounter Medications as of 11/26/2017  Medication  . heparin lock flush 100 unit/mL  . sodium chloride flush (NS) 0.9 % injection 10 mL    ALLERGIES:  Allergies  Allergen Reactions  . Aspirin Other (See Comments)    Bleeding ulcers.   . Ibuprofen Other (See Comments)    Bleeding ulcers.     PHYSICAL EXAM:  ECOG Performance status: 2  Vitals:   11/26/17 0824  BP: 103/61  Pulse: 86  Resp: 18  Temp: 98.4 F (36.9 C)  SpO2: 95%   Filed Weights   11/26/17 0824  Weight: 121 lb 11.2 oz (55.2 kg)    Physical Exam Chest: Bilateral clear to auscultation. CVS: S1-S2 regular rate and rhythm. Lymphadenopathy: No palpable lymphadenopathy in the neck or axillary region.  No palpable splenomegaly. Extremities: 1+ edema bilaterally.  LABORATORY DATA:  I have reviewed the labs as listed.  CBC    Component Value Date/Time   WBC 334.6 (HH) 11/26/2017 0855   RBC 2.81 (L) 11/26/2017 0855   HGB 9.6 (L) 11/26/2017 0855   HCT 29.2 (L) 11/26/2017 0855   PLT 33 (L) 11/26/2017 0855   MCV 103.9 (H) 11/26/2017 0855   MCH 34.2 (H) 11/26/2017 0855   MCHC 32.9 11/26/2017 0855   RDW 24.0 (H) 11/26/2017 0855   LYMPHSABS 327.9 (H) 11/26/2017 0855   MONOABS 0.0 (L) 11/26/2017 0855   EOSABS 0.0 11/26/2017 0855   BASOSABS 0.0 11/26/2017 0855   CMP Latest Ref Rng & Units 11/26/2017 11/25/2017 11/24/2017  Glucose 70 - 99 mg/dL 151(H) 115(H) 114(H)  BUN 8 - 23 mg/dL 37(H) 38(H) 36(H)  Creatinine 0.61 - 1.24 mg/dL 1.38(H) 1.39(H) 1.56(H)  Sodium 135 - 145 mmol/L 140 140 139  Potassium 3.5 - 5.1 mmol/L 5.2(H) 4.8 4.3  Chloride 98 - 111 mmol/L 107 111 109  CO2 22 - 32 mmol/L 27 23 22   Calcium 8.9 - 10.3 mg/dL 8.6(L) 7.8(L) 7.9(L)  Total Protein 6.5 - 8.1 g/dL - 5.6(L) 6.0(L)  Total Bilirubin 0.3 - 1.2 mg/dL - 0.4 0.6  Alkaline Phos 38 - 126 U/L - 109 129(H)    AST 15 - 41 U/L - 23 30  ALT 0 - 44 U/L - 22 25         ASSESSMENT & PLAN:   CLL (chronic lymphocytic leukemia) (H[facility]) 1. Stage IV CLL with history of poor tolerance to past chemotherapies: -Could not tolerate past treatments including FCR in 2010, Imbruvica 2015 (located by large leg rash), Zydelig 5885 (complicated by transaminitis despite dose reduction), Imbruvica rechallenge in 0277 (complicated by infection and progressive fatigue), bendamustine/Rituxan 1 cycle in 4128 (complicated by prolonged and significant cytopenia in addition to tumor lysis syndrome and renal failure).  He could not even tolerate last treatment with obinutuzumab and chlorambucil. -He reports recent onset night sweats, tiredness on exertion and 15 pound weight loss in the last 1 month. -Ibrutinib 140 mg daily started on 11/22/2017 after TLS prophylaxis with rasburicase.  He was hospitalized for the next 2 days for observation with continuous IV fluids and lab monitoring.  He did not develop any tumor lysis syndrome.  He received 1 unit of blood transfusion yesterday prior to discharge. - He reports that he is feeling well.  Denies any nausea vomiting or diarrhea.  Will continue to monitor his labs closely today and on Thursday.  I will see him back early next week.  He has developed mild hyperkalemia with potassium of 5.2.  We will give him Kayexalate.   2.  Left temporal melanoma: -This was reportedly biopsied by Dr. Nevada Crane few months ago and was thought to be melanoma.  Patient developed left preauricular adenopathy in the last few weeks.  Wife also reports that the temporal nodular lesion is increasing in the last 2 to 3 weeks.  We will address this once he is safely able to manage Ibrutinib.  3. TLS prevention: - He received rasburicase.  He was also hospitalized for 2 days with continuous lab monitoring and IV fluids.  He did not develop any tumor lysis syndrome.           Orders placed this encounter:   Orders Placed This Encounter  Procedures  . Basic metabolic panel  . CBC with Differential  . Lactate dehydrogenase  . Magnesium  . Phosphorus  . Phosphorus  . Magnesium  . CBC with Differential/Platelet  . Comprehensive metabolic panel  . Lactate dehydrogenase  . Phosphorus  . Magnesium  . CBC with Differential/Platelet  . Comprehensive metabolic panel  . Lactate dehydrogenase      Derek Jack, MD [place] [phone]

## 2017-11-28 ENCOUNTER — Inpatient Hospital Stay (HOSPITAL_COMMUNITY): Payer: Medicare Other

## 2017-11-28 DIAGNOSIS — E875 Hyperkalemia: Secondary | ICD-10-CM | POA: Diagnosis not present

## 2017-11-28 DIAGNOSIS — Z950 Presence of cardiac pacemaker: Secondary | ICD-10-CM | POA: Diagnosis not present

## 2017-11-28 DIAGNOSIS — E119 Type 2 diabetes mellitus without complications: Secondary | ICD-10-CM | POA: Diagnosis not present

## 2017-11-28 DIAGNOSIS — Z85820 Personal history of malignant melanoma of skin: Secondary | ICD-10-CM | POA: Diagnosis not present

## 2017-11-28 DIAGNOSIS — R599 Enlarged lymph nodes, unspecified: Secondary | ICD-10-CM | POA: Diagnosis not present

## 2017-11-28 DIAGNOSIS — I4892 Unspecified atrial flutter: Secondary | ICD-10-CM | POA: Diagnosis not present

## 2017-11-28 DIAGNOSIS — Z79899 Other long term (current) drug therapy: Secondary | ICD-10-CM | POA: Diagnosis not present

## 2017-11-28 DIAGNOSIS — R220 Localized swelling, mass and lump, head: Secondary | ICD-10-CM | POA: Diagnosis not present

## 2017-11-28 DIAGNOSIS — Z87891 Personal history of nicotine dependence: Secondary | ICD-10-CM | POA: Diagnosis not present

## 2017-11-28 DIAGNOSIS — Z9221 Personal history of antineoplastic chemotherapy: Secondary | ICD-10-CM | POA: Diagnosis not present

## 2017-11-28 DIAGNOSIS — R61 Generalized hyperhidrosis: Secondary | ICD-10-CM | POA: Diagnosis not present

## 2017-11-28 DIAGNOSIS — E883 Tumor lysis syndrome: Secondary | ICD-10-CM | POA: Diagnosis not present

## 2017-11-28 DIAGNOSIS — C9110 Chronic lymphocytic leukemia of B-cell type not having achieved remission: Secondary | ICD-10-CM | POA: Diagnosis not present

## 2017-11-28 DIAGNOSIS — Z6821 Body mass index (BMI) 21.0-21.9, adult: Secondary | ICD-10-CM | POA: Diagnosis not present

## 2017-11-28 DIAGNOSIS — I10 Essential (primary) hypertension: Secondary | ICD-10-CM | POA: Diagnosis not present

## 2017-11-28 LAB — PHOSPHORUS: PHOSPHORUS: 5.1 mg/dL — AB (ref 2.5–4.6)

## 2017-11-28 LAB — CBC WITH DIFFERENTIAL/PLATELET
BASOS PCT: 0 %
Band Neutrophils: 0 %
Basophils Absolute: 0 10*3/uL (ref 0.0–0.1)
Blasts: 0 %
EOS PCT: 0 %
Eosinophils Absolute: 0 10*3/uL (ref 0.0–0.7)
HCT: 29.8 % — ABNORMAL LOW (ref 39.0–52.0)
Hemoglobin: 9.7 g/dL — ABNORMAL LOW (ref 13.0–17.0)
LYMPHS ABS: 338.3 10*3/uL — AB (ref 0.7–4.0)
Lymphocytes Relative: 100 %
MCH: 34 pg (ref 26.0–34.0)
MCHC: 32.6 g/dL (ref 30.0–36.0)
MCV: 104.6 fL — AB (ref 78.0–100.0)
MYELOCYTES: 0 %
Metamyelocytes Relative: 0 %
Monocytes Absolute: 0 10*3/uL — ABNORMAL LOW (ref 0.1–1.0)
Monocytes Relative: 0 %
NEUTROS ABS: 0 10*3/uL — AB (ref 1.7–7.7)
NEUTROS PCT: 0 %
NRBC: 0 /100{WBCs}
Other: 0 %
PLATELETS: 36 10*3/uL — AB (ref 150–400)
Promyelocytes Relative: 0 %
RBC: 2.85 MIL/uL — ABNORMAL LOW (ref 4.22–5.81)
RDW: 22.1 % — AB (ref 11.5–15.5)
WBC: 338.3 10*3/uL (ref 4.0–10.5)

## 2017-11-28 LAB — COMPREHENSIVE METABOLIC PANEL
ALT: 21 U/L (ref 0–44)
ANION GAP: 7 (ref 5–15)
AST: 25 U/L (ref 15–41)
Albumin: 2.6 g/dL — ABNORMAL LOW (ref 3.5–5.0)
Alkaline Phosphatase: 130 U/L — ABNORMAL HIGH (ref 38–126)
BUN: 33 mg/dL — ABNORMAL HIGH (ref 8–23)
CHLORIDE: 104 mmol/L (ref 98–111)
CO2: 28 mmol/L (ref 22–32)
Calcium: 9 mg/dL (ref 8.9–10.3)
Creatinine, Ser: 1.46 mg/dL — ABNORMAL HIGH (ref 0.61–1.24)
GFR, EST AFRICAN AMERICAN: 51 mL/min — AB (ref >60)
GFR, EST NON AFRICAN AMERICAN: 44 mL/min — AB (ref >60)
Glucose, Bld: 138 mg/dL — ABNORMAL HIGH (ref 70–99)
POTASSIUM: 5.4 mmol/L — AB (ref 3.5–5.1)
Sodium: 139 mmol/L (ref 135–145)
Total Bilirubin: 0.5 mg/dL (ref 0.3–1.2)
Total Protein: 6.4 g/dL — ABNORMAL LOW (ref 6.5–8.1)

## 2017-11-28 LAB — URIC ACID: URIC ACID, SERUM: 2.5 mg/dL — AB (ref 3.7–8.6)

## 2017-11-28 LAB — LACTATE DEHYDROGENASE: LDH: 167 U/L (ref 98–192)

## 2017-11-28 NOTE — Progress Notes (Signed)
CRITICAL VALUE ALERT  Critical Value:  WBC 338  Date & Time Notied:  11/28/17 at Portal  Provider Notified: Dr. Delton Coombes  Orders Received/Actions taken: n/a

## 2017-11-29 ENCOUNTER — Other Ambulatory Visit (HOSPITAL_COMMUNITY): Payer: Medicare Other

## 2017-11-29 ENCOUNTER — Inpatient Hospital Stay (HOSPITAL_COMMUNITY): Payer: Medicare Other

## 2017-11-29 VITALS — BP 96/60 | HR 92 | Temp 97.7°F | Resp 16

## 2017-11-29 DIAGNOSIS — I4892 Unspecified atrial flutter: Secondary | ICD-10-CM | POA: Diagnosis not present

## 2017-11-29 DIAGNOSIS — E883 Tumor lysis syndrome: Secondary | ICD-10-CM | POA: Diagnosis not present

## 2017-11-29 DIAGNOSIS — I10 Essential (primary) hypertension: Secondary | ICD-10-CM | POA: Diagnosis not present

## 2017-11-29 DIAGNOSIS — R599 Enlarged lymph nodes, unspecified: Secondary | ICD-10-CM | POA: Diagnosis not present

## 2017-11-29 DIAGNOSIS — E119 Type 2 diabetes mellitus without complications: Secondary | ICD-10-CM | POA: Diagnosis not present

## 2017-11-29 DIAGNOSIS — E875 Hyperkalemia: Secondary | ICD-10-CM | POA: Diagnosis not present

## 2017-11-29 DIAGNOSIS — C9110 Chronic lymphocytic leukemia of B-cell type not having achieved remission: Secondary | ICD-10-CM | POA: Diagnosis not present

## 2017-11-29 DIAGNOSIS — Z87891 Personal history of nicotine dependence: Secondary | ICD-10-CM | POA: Diagnosis not present

## 2017-11-29 DIAGNOSIS — Z9221 Personal history of antineoplastic chemotherapy: Secondary | ICD-10-CM | POA: Diagnosis not present

## 2017-11-29 DIAGNOSIS — E538 Deficiency of other specified B group vitamins: Secondary | ICD-10-CM

## 2017-11-29 DIAGNOSIS — Z85820 Personal history of malignant melanoma of skin: Secondary | ICD-10-CM | POA: Diagnosis not present

## 2017-11-29 DIAGNOSIS — D696 Thrombocytopenia, unspecified: Secondary | ICD-10-CM

## 2017-11-29 DIAGNOSIS — R61 Generalized hyperhidrosis: Secondary | ICD-10-CM | POA: Diagnosis not present

## 2017-11-29 DIAGNOSIS — Z79899 Other long term (current) drug therapy: Secondary | ICD-10-CM | POA: Diagnosis not present

## 2017-11-29 DIAGNOSIS — Z950 Presence of cardiac pacemaker: Secondary | ICD-10-CM | POA: Diagnosis not present

## 2017-11-29 MED ORDER — SODIUM CHLORIDE 0.9% FLUSH
10.0000 mL | Freq: Once | INTRAVENOUS | Status: AC
  Administered 2017-11-29: 10 mL via INTRAVENOUS

## 2017-11-29 MED ORDER — CYANOCOBALAMIN 1000 MCG/ML IJ SOLN
1000.0000 ug | Freq: Once | INTRAMUSCULAR | Status: AC
  Administered 2017-11-29: 1000 ug via INTRAMUSCULAR

## 2017-11-29 MED ORDER — SODIUM CHLORIDE 0.9 % IV SOLN
INTRAVENOUS | Status: AC
  Administered 2017-11-29: 08:00:00 via INTRAVENOUS

## 2017-11-29 MED ORDER — CYANOCOBALAMIN 1000 MCG/ML IJ SOLN
INTRAMUSCULAR | Status: AC
  Filled 2017-11-29: qty 1

## 2017-11-29 MED ORDER — HEPARIN SOD (PORK) LOCK FLUSH 100 UNIT/ML IV SOLN
500.0000 [IU] | Freq: Once | INTRAVENOUS | Status: AC
  Administered 2017-11-29: 500 [IU] via INTRAVENOUS

## 2017-11-29 NOTE — Patient Instructions (Signed)
[place] at [facility]  Discharge Instructions:  You received hydration today and a b12 shot.   _______________________________________________________________  Thank you for choosing [place] at [facility] to provide your oncology and hematology care.  To afford each patient quality time with our providers, please arrive at least 15 minutes before your scheduled appointment.  You need to re-schedule your appointment if you arrive 10 or more minutes late.  We strive to give you quality time with our providers, and arriving late affects you and other patients whose appointments are after yours.  Also, if you no show three or more times for appointments you may be dismissed from the clinic.  Again, thank you for choosing [place] at [place] hope is that these requests will allow you access to exceptional care and in a timely manner. _______________________________________________________________  If you have questions after your visit, please contact our office at [phone] between the hours of 8:30 a.m. and 5:00 p.m. Voicemails left after 4:30 p.m. will not be returned until the following business day. _______________________________________________________________  For prescription refill requests, have your pharmacy contact our office. _______________________________________________________________  Recommendations made by the consultant and any test results will be sent to your referring physician. _______________________________________________________________

## 2017-11-29 NOTE — Progress Notes (Signed)
Patient tolerated hydration with no complaints voiced.  Patients wife stated he took the kayexalate last night with no complaints voiced.  Port site clean and dry with no complaints with flush. No bruising or swelling noted at site.  Band aid applied. Patient tolerated b12 shot with no complaints.  Band aid applied.  VSS with discharge and left ambulatory with wife with no s/s of distress noted.

## 2017-12-02 ENCOUNTER — Inpatient Hospital Stay (HOSPITAL_COMMUNITY): Payer: Medicare Other

## 2017-12-02 ENCOUNTER — Ambulatory Visit (HOSPITAL_COMMUNITY): Payer: Medicare Other

## 2017-12-02 ENCOUNTER — Encounter (HOSPITAL_COMMUNITY): Payer: Self-pay

## 2017-12-02 VITALS — BP 115/68 | HR 88 | Temp 97.6°F | Resp 16 | Wt 116.2 lb

## 2017-12-02 DIAGNOSIS — C44329 Squamous cell carcinoma of skin of other parts of face: Secondary | ICD-10-CM | POA: Diagnosis not present

## 2017-12-02 DIAGNOSIS — I10 Essential (primary) hypertension: Secondary | ICD-10-CM | POA: Diagnosis not present

## 2017-12-02 DIAGNOSIS — C9110 Chronic lymphocytic leukemia of B-cell type not having achieved remission: Secondary | ICD-10-CM | POA: Diagnosis not present

## 2017-12-02 DIAGNOSIS — I4892 Unspecified atrial flutter: Secondary | ICD-10-CM | POA: Diagnosis not present

## 2017-12-02 DIAGNOSIS — R599 Enlarged lymph nodes, unspecified: Secondary | ICD-10-CM | POA: Diagnosis not present

## 2017-12-02 DIAGNOSIS — Z08 Encounter for follow-up examination after completed treatment for malignant neoplasm: Secondary | ICD-10-CM | POA: Diagnosis not present

## 2017-12-02 DIAGNOSIS — E875 Hyperkalemia: Secondary | ICD-10-CM | POA: Diagnosis not present

## 2017-12-02 DIAGNOSIS — Z9221 Personal history of antineoplastic chemotherapy: Secondary | ICD-10-CM | POA: Diagnosis not present

## 2017-12-02 DIAGNOSIS — Z950 Presence of cardiac pacemaker: Secondary | ICD-10-CM | POA: Diagnosis not present

## 2017-12-02 DIAGNOSIS — Z87891 Personal history of nicotine dependence: Secondary | ICD-10-CM | POA: Diagnosis not present

## 2017-12-02 DIAGNOSIS — E883 Tumor lysis syndrome: Secondary | ICD-10-CM

## 2017-12-02 DIAGNOSIS — D225 Melanocytic nevi of trunk: Secondary | ICD-10-CM | POA: Diagnosis not present

## 2017-12-02 DIAGNOSIS — Z1283 Encounter for screening for malignant neoplasm of skin: Secondary | ICD-10-CM | POA: Diagnosis not present

## 2017-12-02 DIAGNOSIS — E119 Type 2 diabetes mellitus without complications: Secondary | ICD-10-CM | POA: Diagnosis not present

## 2017-12-02 DIAGNOSIS — Z79899 Other long term (current) drug therapy: Secondary | ICD-10-CM | POA: Diagnosis not present

## 2017-12-02 DIAGNOSIS — R61 Generalized hyperhidrosis: Secondary | ICD-10-CM | POA: Diagnosis not present

## 2017-12-02 DIAGNOSIS — Z85820 Personal history of malignant melanoma of skin: Secondary | ICD-10-CM | POA: Diagnosis not present

## 2017-12-02 LAB — PHOSPHORUS: Phosphorus: 3.7 mg/dL (ref 2.5–4.6)

## 2017-12-02 LAB — COMPREHENSIVE METABOLIC PANEL
ALT: 20 U/L (ref 0–44)
AST: 26 U/L (ref 15–41)
Albumin: 2.8 g/dL — ABNORMAL LOW (ref 3.5–5.0)
Alkaline Phosphatase: 103 U/L (ref 38–126)
Anion gap: 7 (ref 5–15)
BUN: 26 mg/dL — AB (ref 8–23)
CHLORIDE: 105 mmol/L (ref 98–111)
CO2: 28 mmol/L (ref 22–32)
CREATININE: 1.52 mg/dL — AB (ref 0.61–1.24)
Calcium: 8.2 mg/dL — ABNORMAL LOW (ref 8.9–10.3)
GFR calc Af Amer: 48 mL/min — ABNORMAL LOW (ref >60)
GFR calc non Af Amer: 42 mL/min — ABNORMAL LOW (ref >60)
Glucose, Bld: 124 mg/dL — ABNORMAL HIGH (ref 70–99)
Potassium: 4 mmol/L (ref 3.5–5.1)
Sodium: 140 mmol/L (ref 135–145)
Total Bilirubin: 0.6 mg/dL (ref 0.3–1.2)
Total Protein: 6.7 g/dL (ref 6.5–8.1)

## 2017-12-02 LAB — CBC WITH DIFFERENTIAL/PLATELET
BAND NEUTROPHILS: 0 %
BLASTS: 0 %
Basophils Absolute: 0 10*3/uL (ref 0.0–0.1)
Basophils Relative: 0 %
EOS ABS: 0 10*3/uL (ref 0.0–0.7)
EOS PCT: 0 %
HEMATOCRIT: 30 % — AB (ref 39.0–52.0)
Hemoglobin: 9.2 g/dL — ABNORMAL LOW (ref 13.0–17.0)
LYMPHS ABS: 289.3 10*3/uL — AB (ref 0.7–4.0)
LYMPHS PCT: 99 %
MCH: 33.2 pg (ref 26.0–34.0)
MCHC: 30.7 g/dL (ref 30.0–36.0)
MCV: 108.3 fL — AB (ref 78.0–100.0)
MONOS PCT: 0 %
Metamyelocytes Relative: 0 %
Monocytes Absolute: 0 10*3/uL — ABNORMAL LOW (ref 0.1–1.0)
Myelocytes: 0 %
NEUTROS ABS: 2.9 10*3/uL (ref 1.7–7.7)
Neutrophils Relative %: 1 %
OTHER: 0 %
PLATELETS: 38 10*3/uL — AB (ref 150–400)
Promyelocytes Relative: 0 %
RBC: 2.77 MIL/uL — AB (ref 4.22–5.81)
RDW: 22.4 % — AB (ref 11.5–15.5)
WBC: 292.2 10*3/uL (ref 4.0–10.5)
nRBC: 0 /100 WBC

## 2017-12-02 LAB — URIC ACID: Uric Acid, Serum: 4.7 mg/dL (ref 3.7–8.6)

## 2017-12-02 LAB — LACTATE DEHYDROGENASE: LDH: 156 U/L (ref 98–192)

## 2017-12-02 LAB — MAGNESIUM: MAGNESIUM: 2 mg/dL (ref 1.7–2.4)

## 2017-12-02 MED ORDER — ALTEPLASE 2 MG IJ SOLR
2.0000 mg | Freq: Once | INTRAMUSCULAR | Status: AC
  Administered 2017-12-02: 2 mg

## 2017-12-02 MED ORDER — SODIUM CHLORIDE 0.9 % IV SOLN
Freq: Once | INTRAVENOUS | Status: AC
  Administered 2017-12-02: 09:00:00 via INTRAVENOUS

## 2017-12-02 MED ORDER — SODIUM CHLORIDE 0.9% FLUSH
10.0000 mL | Freq: Once | INTRAVENOUS | Status: AC
  Administered 2017-12-02: 10 mL via INTRAVENOUS

## 2017-12-02 MED ORDER — HEPARIN SOD (PORK) LOCK FLUSH 100 UNIT/ML IV SOLN
500.0000 [IU] | Freq: Once | INTRAVENOUS | Status: AC
  Administered 2017-12-02: 500 [IU] via INTRAVENOUS

## 2017-12-02 NOTE — Progress Notes (Signed)
Patient for hydration today.  Port accessed with medium amount of resistance with flush but denied pain.  No bruising or swelling noted with flush. Needle repositioned.  Hydration started with alarms for patient occlusion.  Several flushes with nacl 9ml same amount of resistance but no complaints of pain.    Peripheral IV started in left antecubital for hydration.  No complaints voiced.  Resting with family at side.   Wife stated the patient is having problems with constipation.  Reviewed constipation measures and will notify us if they need further management.   1010-Alteplase 2ml pushed into the patients port.    1110-Alteplase 96ml withdrawn from port with no blood return noted.  Port flushed per protocol with no complaints voiced.  No bruising or swelling noted at site.  Band aid applied.    Tolerated hydration with no complaints voiced.  Band aid applied.  VSs with discharge and left ambulatory with no s/s of distress noted.

## 2017-12-02 NOTE — Patient Instructions (Signed)
[place] at [facility]  Discharge Instructions:  You received hydration today. Return tomorrow as directed.  _______________________________________________________________  Thank you for choosing [place] at [facility] to provide your oncology and hematology care.  To afford each patient quality time with our providers, please arrive at least 15 minutes before your scheduled appointment.  You need to re-schedule your appointment if you arrive 10 or more minutes late.  We strive to give you quality time with our providers, and arriving late affects you and other patients whose appointments are after yours.  Also, if you no show three or more times for appointments you may be dismissed from the clinic.  Again, thank you for choosing [place] at [place] hope is that these requests will allow you access to exceptional care and in a timely manner. _______________________________________________________________  If you have questions after your visit, please contact our office at [phone] between the hours of 8:30 a.m. and 5:00 p.m. Voicemails left after 4:30 p.m. will not be returned until the following business day. _______________________________________________________________  For prescription refill requests, have your pharmacy contact our office. _______________________________________________________________  Recommendations made by the consultant and any test results will be sent to your referring physician. _______________________________________________________________

## 2017-12-02 NOTE — Progress Notes (Signed)
CRITICAL VALUE ALERT  Critical Value:  WBC 292.2  Date & Time Notied:  12/02/17 at [place]  Provider Notified: Dr. Delton Coombes  Orders Received/Actions taken: n/a

## 2017-12-03 ENCOUNTER — Inpatient Hospital Stay (HOSPITAL_COMMUNITY): Payer: Medicare Other

## 2017-12-03 ENCOUNTER — Other Ambulatory Visit: Payer: Self-pay | Admitting: Internal Medicine

## 2017-12-03 DIAGNOSIS — Z87891 Personal history of nicotine dependence: Secondary | ICD-10-CM | POA: Diagnosis not present

## 2017-12-03 DIAGNOSIS — R599 Enlarged lymph nodes, unspecified: Secondary | ICD-10-CM | POA: Diagnosis not present

## 2017-12-03 DIAGNOSIS — I10 Essential (primary) hypertension: Secondary | ICD-10-CM | POA: Diagnosis not present

## 2017-12-03 DIAGNOSIS — I4892 Unspecified atrial flutter: Secondary | ICD-10-CM | POA: Diagnosis not present

## 2017-12-03 DIAGNOSIS — Z85820 Personal history of malignant melanoma of skin: Secondary | ICD-10-CM | POA: Diagnosis not present

## 2017-12-03 DIAGNOSIS — Z9221 Personal history of antineoplastic chemotherapy: Secondary | ICD-10-CM | POA: Diagnosis not present

## 2017-12-03 DIAGNOSIS — R61 Generalized hyperhidrosis: Secondary | ICD-10-CM | POA: Diagnosis not present

## 2017-12-03 DIAGNOSIS — Z950 Presence of cardiac pacemaker: Secondary | ICD-10-CM | POA: Diagnosis not present

## 2017-12-03 DIAGNOSIS — C9110 Chronic lymphocytic leukemia of B-cell type not having achieved remission: Secondary | ICD-10-CM | POA: Diagnosis not present

## 2017-12-03 DIAGNOSIS — E883 Tumor lysis syndrome: Secondary | ICD-10-CM | POA: Diagnosis not present

## 2017-12-03 DIAGNOSIS — Z79899 Other long term (current) drug therapy: Secondary | ICD-10-CM | POA: Diagnosis not present

## 2017-12-03 DIAGNOSIS — E875 Hyperkalemia: Secondary | ICD-10-CM | POA: Diagnosis not present

## 2017-12-03 DIAGNOSIS — E119 Type 2 diabetes mellitus without complications: Secondary | ICD-10-CM | POA: Diagnosis not present

## 2017-12-03 MED ORDER — SODIUM CHLORIDE 0.9 % IV SOLN
INTRAVENOUS | Status: DC
  Administered 2017-12-03: 09:00:00 via INTRAVENOUS

## 2017-12-03 NOTE — Patient Instructions (Signed)
[facility] at [facility] Discharge Instructions  One liter of fluids given today  Follow up as scheduled.  Thank you for choosing [facility] at [facility] to provide your oncology and hematology care.  To afford each patient quality time with our provider, please arrive at least 15 minutes before your scheduled appointment time.   If you have a lab appointment with the Cancer Center please come in thru the  Main Entrance and check in at the main information desk  You need to re-schedule your appointment should you arrive 10 or more minutes late.  We strive to give you quality time with our providers, and arriving late affects you and other patients whose appointments are after yours.  Also, if you no show three or more times for appointments you may be dismissed from the clinic at the providers discretion.     Again, thank you for choosing [facility].  Our hope is that these requests will decrease the amount of time that you wait before being seen by our physicians.       _____________________________________________________________  Should you have questions after your visit to [facility], please contact our office at [phone] between the hours of 8:30 a.m. and 4:30 p.m.  Voicemails left after 4:30 p.m. will not be returned until the following business day.  For prescription refill requests, have your pharmacy contact our office.       Resources For Cancer Patients and their Caregivers ? American Cancer Society: Can assist with transportation, wigs, general needs, runs Look Good Feel Better.        1-888-227-6333 ? Cancer Care: Provides financial assistance, online support groups, medication/co-pay assistance.  1-800-813-HOPE (4673) ? [facility] Assists [place] cancer patients and their families through emotional , educational and financial support.  336-427-4357 ? [place] DSS Where to apply for food stamps, Medicaid and utility assistance. 336-342-1394 ? [facility]: Transportation to medical appointments. 336-347-2287 ? Social Security Administration: May apply for disability if have a Stage IV cancer. 336-342-7796 1-800-772-1213 ? [place] Aging, Disability and Transit Services: Assists with nutrition, care and transit needs. 336-349-2343  Cancer Center Support Programs:   > Cancer Support Group  2nd Tuesday of the month 1pm-2pm, Journey Room   > Creative Journey  3rd Tuesday of the month 1130am-1pm, Journey Room    

## 2017-12-04 ENCOUNTER — Encounter (HOSPITAL_COMMUNITY): Payer: Self-pay

## 2017-12-04 ENCOUNTER — Inpatient Hospital Stay (HOSPITAL_COMMUNITY): Payer: Medicare Other

## 2017-12-04 VITALS — BP 103/77 | HR 93 | Temp 97.6°F | Resp 18 | Wt 119.4 lb

## 2017-12-04 DIAGNOSIS — Z85820 Personal history of malignant melanoma of skin: Secondary | ICD-10-CM | POA: Diagnosis not present

## 2017-12-04 DIAGNOSIS — D696 Thrombocytopenia, unspecified: Secondary | ICD-10-CM

## 2017-12-04 DIAGNOSIS — R61 Generalized hyperhidrosis: Secondary | ICD-10-CM | POA: Diagnosis not present

## 2017-12-04 DIAGNOSIS — E875 Hyperkalemia: Secondary | ICD-10-CM | POA: Diagnosis not present

## 2017-12-04 DIAGNOSIS — E883 Tumor lysis syndrome: Secondary | ICD-10-CM

## 2017-12-04 DIAGNOSIS — C9110 Chronic lymphocytic leukemia of B-cell type not having achieved remission: Secondary | ICD-10-CM

## 2017-12-04 DIAGNOSIS — Z87891 Personal history of nicotine dependence: Secondary | ICD-10-CM | POA: Diagnosis not present

## 2017-12-04 DIAGNOSIS — R599 Enlarged lymph nodes, unspecified: Secondary | ICD-10-CM | POA: Diagnosis not present

## 2017-12-04 DIAGNOSIS — I10 Essential (primary) hypertension: Secondary | ICD-10-CM | POA: Diagnosis not present

## 2017-12-04 DIAGNOSIS — E119 Type 2 diabetes mellitus without complications: Secondary | ICD-10-CM | POA: Diagnosis not present

## 2017-12-04 DIAGNOSIS — Z950 Presence of cardiac pacemaker: Secondary | ICD-10-CM | POA: Diagnosis not present

## 2017-12-04 DIAGNOSIS — I4892 Unspecified atrial flutter: Secondary | ICD-10-CM | POA: Diagnosis not present

## 2017-12-04 DIAGNOSIS — Z9221 Personal history of antineoplastic chemotherapy: Secondary | ICD-10-CM | POA: Diagnosis not present

## 2017-12-04 DIAGNOSIS — Z79899 Other long term (current) drug therapy: Secondary | ICD-10-CM | POA: Diagnosis not present

## 2017-12-04 LAB — CUP PACEART REMOTE DEVICE CHECK
Battery Remaining Longevity: 133 mo
Brady Statistic AP VP Percent: 0 %
Brady Statistic AP VS Percent: 0 %
Brady Statistic AS VP Percent: 72.37 %
Brady Statistic AS VS Percent: 27.63 %
Brady Statistic RA Percent Paced: 0 %
Date Time Interrogation Session: 20190709042758
Implantable Lead Implant Date: 20180927
Implantable Lead Implant Date: 20180927
Implantable Lead Location: 753859
Implantable Lead Location: 753860
Lead Channel Impedance Value: 304 Ohm
Lead Channel Impedance Value: 323 Ohm
Lead Channel Impedance Value: 380 Ohm
Lead Channel Pacing Threshold Amplitude: 0.5 V
Lead Channel Pacing Threshold Pulse Width: 0.4 ms
Lead Channel Sensing Intrinsic Amplitude: 1.25 mV
Lead Channel Sensing Intrinsic Amplitude: 1.375 mV
Lead Channel Sensing Intrinsic Amplitude: 7.25 mV
Lead Channel Sensing Intrinsic Amplitude: 7.25 mV
Lead Channel Setting Pacing Amplitude: 2.5 V
Lead Channel Setting Pacing Pulse Width: 0.4 ms
MDC IDC MSMT BATTERY VOLTAGE: 3.07 V
MDC IDC MSMT LEADCHNL RA IMPEDANCE VALUE: 266 Ohm
MDC IDC PG IMPLANT DT: 20180927
MDC IDC SET LEADCHNL RV SENSING SENSITIVITY: 1.2 mV
MDC IDC STAT BRADY RV PERCENT PACED: 72.37 %

## 2017-12-04 MED ORDER — SODIUM CHLORIDE 0.9 % IV SOLN
INTRAVENOUS | Status: DC
  Administered 2017-12-04: 1000 mL via INTRAVENOUS

## 2017-12-04 MED ORDER — HEPARIN SOD (PORK) LOCK FLUSH 100 UNIT/ML IV SOLN
500.0000 [IU] | Freq: Once | INTRAVENOUS | Status: AC
  Administered 2017-12-04: 500 [IU] via INTRAVENOUS

## 2017-12-04 MED ORDER — SODIUM CHLORIDE 0.9% FLUSH
10.0000 mL | Freq: Once | INTRAVENOUS | Status: AC
  Administered 2017-12-04: 10 mL via INTRAVENOUS

## 2017-12-04 NOTE — Progress Notes (Signed)
Patients port accessed from yesterday.  Flushed with medium resistance but no complaints of pain with flush.  No bruising or swelling noted at site with flush.  Dressing intact.  Pump alarms with occlusion on patient side.  Needle repositioned and with alarms still sounding for occlusion on patient side.  Port redressed and left accessed for tomorrow.  Peripheral IV started left arm with good blood return noted and no complaints of pain.    Patient stated he bumped his left lower leg posterior side on the bed during the night.  Dime sized bruise noted but no complaints of pain.   Family at side.  No s/s of distress noted.   Patient tolerated hydration with no complaints voiced.  Port site clean and dry with no bruising or swelling noted.  Band aid applied to port and peripheral IV site.  No complaints of pain.  VSS with discharge and left ambulatory with wife with no s/s fo distress noted.

## 2017-12-04 NOTE — Patient Instructions (Signed)
[facility] at [facility]  Discharge Instructions:  You received hydration today.  _______________________________________________________________  Thank you for choosing [facility] at [facility] to provide your oncology and hematology care.  To afford each patient quality time with our providers, please arrive at least 15 minutes before your scheduled appointment.  You need to re-schedule your appointment if you arrive 10 or more minutes late.  We strive to give you quality time with our providers, and arriving late affects you and other patients whose appointments are after yours.  Also, if you no show three or more times for appointments you may be dismissed from the clinic.  Again, thank you for choosing [facility] at [facility]. Our hope is that these requests will allow you access to exceptional care and in a timely manner. _______________________________________________________________  If you have questions after your visit, please contact our office at [phone] between the hours of 8:30 a.m. and 5:00 p.m. Voicemails left after 4:30 p.m. will not be returned until the following business day. _______________________________________________________________  For prescription refill requests, have your pharmacy contact our office. _______________________________________________________________  Recommendations made by the consultant and any test results will be sent to your referring physician. _______________________________________________________________ 

## 2017-12-05 ENCOUNTER — Other Ambulatory Visit: Payer: Self-pay

## 2017-12-05 ENCOUNTER — Inpatient Hospital Stay (HOSPITAL_COMMUNITY): Payer: Medicare Other

## 2017-12-05 ENCOUNTER — Encounter (HOSPITAL_COMMUNITY): Payer: Self-pay | Admitting: Hematology

## 2017-12-05 ENCOUNTER — Inpatient Hospital Stay (HOSPITAL_BASED_OUTPATIENT_CLINIC_OR_DEPARTMENT_OTHER): Payer: Medicare Other | Admitting: Hematology

## 2017-12-05 ENCOUNTER — Other Ambulatory Visit (HOSPITAL_COMMUNITY): Payer: Medicare Other

## 2017-12-05 VITALS — BP 117/68 | HR 89 | Temp 97.9°F | Resp 18 | Wt 119.8 lb

## 2017-12-05 DIAGNOSIS — E119 Type 2 diabetes mellitus without complications: Secondary | ICD-10-CM | POA: Diagnosis not present

## 2017-12-05 DIAGNOSIS — R599 Enlarged lymph nodes, unspecified: Secondary | ICD-10-CM | POA: Diagnosis not present

## 2017-12-05 DIAGNOSIS — C9110 Chronic lymphocytic leukemia of B-cell type not having achieved remission: Secondary | ICD-10-CM

## 2017-12-05 DIAGNOSIS — Z9221 Personal history of antineoplastic chemotherapy: Secondary | ICD-10-CM

## 2017-12-05 DIAGNOSIS — E883 Tumor lysis syndrome: Secondary | ICD-10-CM | POA: Diagnosis not present

## 2017-12-05 DIAGNOSIS — R61 Generalized hyperhidrosis: Secondary | ICD-10-CM

## 2017-12-05 DIAGNOSIS — E875 Hyperkalemia: Secondary | ICD-10-CM | POA: Diagnosis not present

## 2017-12-05 DIAGNOSIS — Z79899 Other long term (current) drug therapy: Secondary | ICD-10-CM | POA: Diagnosis not present

## 2017-12-05 DIAGNOSIS — I10 Essential (primary) hypertension: Secondary | ICD-10-CM | POA: Diagnosis not present

## 2017-12-05 DIAGNOSIS — Z950 Presence of cardiac pacemaker: Secondary | ICD-10-CM | POA: Diagnosis not present

## 2017-12-05 DIAGNOSIS — I4892 Unspecified atrial flutter: Secondary | ICD-10-CM | POA: Diagnosis not present

## 2017-12-05 DIAGNOSIS — D696 Thrombocytopenia, unspecified: Secondary | ICD-10-CM

## 2017-12-05 DIAGNOSIS — Z85820 Personal history of malignant melanoma of skin: Secondary | ICD-10-CM

## 2017-12-05 DIAGNOSIS — Z87891 Personal history of nicotine dependence: Secondary | ICD-10-CM | POA: Diagnosis not present

## 2017-12-05 DIAGNOSIS — R634 Abnormal weight loss: Secondary | ICD-10-CM

## 2017-12-05 LAB — CBC WITH DIFFERENTIAL/PLATELET
BASOS ABS: 0 10*3/uL (ref 0.0–0.1)
BASOS PCT: 0 %
Eosinophils Absolute: 0 10*3/uL (ref 0.0–0.7)
Eosinophils Relative: 0 %
HCT: 27.5 % — ABNORMAL LOW (ref 39.0–52.0)
Hemoglobin: 8.4 g/dL — ABNORMAL LOW (ref 13.0–17.0)
LYMPHS PCT: 99 %
Lymphs Abs: 202.2 10*3/uL — ABNORMAL HIGH (ref 0.7–4.0)
MCH: 33.5 pg (ref 26.0–34.0)
MCHC: 30.5 g/dL (ref 30.0–36.0)
MCV: 109.6 fL — AB (ref 78.0–100.0)
Monocytes Absolute: 2 10*3/uL — ABNORMAL HIGH (ref 0.1–1.0)
Monocytes Relative: 1 %
NEUTROS PCT: 0 %
Neutro Abs: 0 10*3/uL — ABNORMAL LOW (ref 1.7–7.7)
Platelets: 41 10*3/uL — ABNORMAL LOW (ref 150–400)
RBC: 2.51 MIL/uL — ABNORMAL LOW (ref 4.22–5.81)
RDW: 23.1 % — ABNORMAL HIGH (ref 11.5–15.5)
WBC: 204.2 10*3/uL (ref 4.0–10.5)

## 2017-12-05 LAB — COMPREHENSIVE METABOLIC PANEL WITH GFR
ALT: 15 U/L (ref 0–44)
AST: 17 U/L (ref 15–41)
Albumin: 2.8 g/dL — ABNORMAL LOW (ref 3.5–5.0)
Alkaline Phosphatase: 92 U/L (ref 38–126)
Anion gap: 6 (ref 5–15)
BUN: 22 mg/dL (ref 8–23)
CO2: 26 mmol/L (ref 22–32)
Calcium: 8 mg/dL — ABNORMAL LOW (ref 8.9–10.3)
Chloride: 108 mmol/L (ref 98–111)
Creatinine, Ser: 1.43 mg/dL — ABNORMAL HIGH (ref 0.61–1.24)
GFR calc Af Amer: 52 mL/min — ABNORMAL LOW
GFR calc non Af Amer: 45 mL/min — ABNORMAL LOW
Glucose, Bld: 128 mg/dL — ABNORMAL HIGH (ref 70–99)
Potassium: 4.5 mmol/L (ref 3.5–5.1)
Sodium: 140 mmol/L (ref 135–145)
Total Bilirubin: 0.5 mg/dL (ref 0.3–1.2)
Total Protein: 6.5 g/dL (ref 6.5–8.1)

## 2017-12-05 LAB — URIC ACID: Uric Acid, Serum: 4.4 mg/dL (ref 3.7–8.6)

## 2017-12-05 LAB — MAGNESIUM: Magnesium: 1.8 mg/dL (ref 1.7–2.4)

## 2017-12-05 LAB — PHOSPHORUS: Phosphorus: 2.8 mg/dL (ref 2.5–4.6)

## 2017-12-05 LAB — LACTATE DEHYDROGENASE: LDH: 143 U/L (ref 98–192)

## 2017-12-05 MED ORDER — SODIUM CHLORIDE 0.9% FLUSH
10.0000 mL | Freq: Once | INTRAVENOUS | Status: AC
  Administered 2017-12-05: 10 mL via INTRAVENOUS

## 2017-12-05 MED ORDER — OCTREOTIDE ACETATE 30 MG IM KIT
PACK | INTRAMUSCULAR | Status: AC
Start: 2017-12-05 — End: ?
  Filled 2017-12-05: qty 1

## 2017-12-05 MED ORDER — SODIUM CHLORIDE 0.9 % IV SOLN
INTRAVENOUS | Status: DC
  Administered 2017-12-05: 1000 mL via INTRAVENOUS

## 2017-12-05 MED ORDER — HEPARIN SOD (PORK) LOCK FLUSH 100 UNIT/ML IV SOLN
500.0000 [IU] | Freq: Once | INTRAVENOUS | Status: AC
  Administered 2017-12-05: 500 [IU] via INTRAVENOUS

## 2017-12-05 NOTE — Progress Notes (Signed)
CRITICAL VALUE STICKER  CRITICAL VALUE: WBC 204.2  RECEIVER (on-site recipient of call): Forest Gleason, Rn  Charlyne Petrin, RN  DATE & TIME NOTIFIED: 12/05/2017 @ [facility] (representative from lab): H. Flynt  MD NOTIFIED: Delton Coombes  TIME OF NOTIFICATION: 0850  RESPONSE: Seen by oncologist today.  Continue medication as directed.   Patient tolerated hydration with no complaints voiced.  Port flushed with no bruising or swelling noted at site. Dressing intact.  Patient voided 464ml yellow colored urine.  Family at side and left with no s/s of distress noted.

## 2017-12-05 NOTE — Progress Notes (Signed)
[name], [place] [address]   CLINIC:  Medical Oncology/Hematology  PCP:  [name], MD [place] [address] [phone]   REASON FOR VISIT:  Follow-up for Chronic lymphocytic leukemia   CURRENT THERAPY: Ibrutinib 280mg  with allopurinol 300mg  daily     BRIEF ONCOLOGIC HISTORY:    CLL (chronic lymphocytic leukemia) ([place])   02/14/2005 Procedure    Peripheral flow cytometry- CLL (without 11q or 17 p deletion) presenting with stage 0 disease      10/25/2008 Progression    Increasing WBC and symptoms suggestive of progressive disease      11/09/2008 - 01/07/2009 Chemotherapy    Rituxan, fludarabine, cytoxan x 3 cycles with the discontinuation of fludarabine and cytoxan due to severe pancytopenia requiring hospitalization.      01/10/2009 Adverse Reaction    Severe pancytopenia and adverse side effects      02/01/2009 - 04/01/2009 Chemotherapy    Rituxan every 21 days x 3 cycles      04/02/2009 Remission    Stable disease.  Chemotherapy holiday started      06/22/2013 - 06/29/2013 Chemotherapy    Imbruvica daily      06/29/2013 Adverse Reaction    Large right posterior leg (popliteal) ecchymosis/violaceous rash.  Likely Imbruvica-induced      07/31/2013 - 10/12/2013 Chemotherapy    Zydelig (idelalisib) 150 mg BID monotherapy      10/12/2013 Adverse Reaction    Transaminitis, suspected to be secondary to [place].  Dose decreased.      10/12/2013 - 11/09/2013 Chemotherapy    Zydelig (idelalisib) 150 mg daily.      11/09/2013 Adverse Reaction    Worsening transaminitis.  Zydelig on HOLD      11/09/2013 Treatment Plan Change    Hold Zydelig      12/28/2013 Adverse Reaction    Herpes Zoster outbreak.  Partial response with Famvir x 1 week, switched to acyclovir.  Progression over 1-2 weeks with development of keratoconjunctivitis bilaterally.  VZ Immune Globulin 625 mg IM given on 01/13/14.      02/08/2014  Treatment Plan Change    Ibrutinib restarted 140 mg daily for one week followed by 280 mg daily followed by 420 mg daily with prophylactic acyclovir.      02/08/2014 - 07/05/2014 Chemotherapy    Ibrutinib 420 mg daily      110/13/2015 - 04/17/2014 Hospital Admission    Right lower lobe pneumonia      07/05/2014 Adverse Reaction    Fissuring of fingertips and sore, red feet with transaminitis      07/05/2014 Treatment Plan Change    Hold Ibrutinib      07/22/2014 - 08/19/2014 Chemotherapy    Ibrutinib restarted at 280 mg daily.      08/09/2014 - 08/12/2014 Hospital Admission    Bilateral pneumonia      08/19/2014 Adverse Reaction    Nausea with vomiting.  Escorted to ED.      08/19/2014 Treatment Plan Change    Ibrutinib on hold- ED escort from clinic       10/18/2014 Miscellaneous    B12 weekly x 4 then monthly.      12/15/2015 Imaging    Bone density- BMD as determined from Femur Neck Left is 0.803 g/cm2 with a T-score of -2.1. This patient is considered osteopenic by World Healh Organization (WHO) Criteria.      12/16/2015 Imaging    CT abd/pelvis-  Progression  of retroperitoneal and portacaval adenopathy compared to the prior study. Likely related to CLL.  Small hiatal hernia  Mild compression fractures T11 and T12, probably old. Correlate with any pain in this area.      05/22/2016 - 05/28/2016 Chemotherapy    BR day 1 and 2 x 1 cycle.  Bendamustine dose reduced by 50%.      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      01/28/2017 -  Chemotherapy    Cycle 1 day 1 of Gayzva and Chlorambucil. Rasburicase 3 mg given also.       01/28/2017 - 02/08/2017 Hospital Admission    Admitted for sycopal episode due to complete heart block. Patient went into PEA arrest  requiring CPR and intubation. 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 Pacemaker placed while inpatient. Started on eliquis.        INTERVAL HISTORY:  Randall Reyes 82 y.o. male returns for routine follow-up for chronic lymphocytic leukemia. Patient is here today with his wife. Patient had a lump removed off of the left side of his head by Dr. Nevada Crane on Monday. He is doing well since the procedure. He has some bruising on his legs. His leg swelling is stable. Denies any nausea, vomiting, or diarrhea. Denies any bleeding. He will come tomorrow for more fluids His energy is still low and his appetite remains the same. He is maintaining his weight.    REVIEW OF SYSTEMS:  Review of Systems  Constitutional: Positive for fatigue.  HENT:  Negative.   Eyes: Negative.   Respiratory: Negative.   Cardiovascular: Negative.   Gastrointestinal: Negative.   Endocrine: Negative.   Genitourinary: Negative.    Musculoskeletal: Negative.   Skin: Negative.   Neurological: Negative.   Hematological: Negative.   Psychiatric/Behavioral: Negative.      PAST MEDICAL/SURGICAL HISTORY:  Past Medical History:  Diagnosis Date  . Anemia   . B12 deficiency 12/07/2014  . CLL (chronic lymphocytic leukemia) ([place]) 01/24/2011  . Diabetes mellitus   . DM (diabetes mellitus) ([place]) 01/24/2011  . Eczema 01/24/2011  . History of pneumonia 04/2014  . Hypertension   . Leukemia ([place]) 6.22.2012   PER PATIENT  . Melanoma in situ ([place]) 01/24/2011  . Mild obesity 01/24/2011  . Port catheter in place 05/02/2012  . Shingles   . Ulcer    Past Surgical History:  Procedure Laterality Date  . bleeding ulcer    . CATARACT EXTRACTION W/PHACO  05/05/2012   Procedure: CATARACT EXTRACTION PHACO AND INTRAOCULAR LENS PLACEMENT (IOC);  Surgeon: Tonny Branch, MD;  Location: [facility] ORS;  Service: Ophthalmology;  Laterality: Right;  CDE:13.25  . CATARACT EXTRACTION W/PHACO  05/15/2012   Procedure: CATARACT  EXTRACTION PHACO AND INTRAOCULAR LENS PLACEMENT (IOC);  Surgeon: Tonny Branch, MD;  Location: [facility] ORS;  Service: Ophthalmology;  Laterality: Left;  CDE:  12.32  . COLONOSCOPY N/A 01/30/2016   Procedure: COLONOSCOPY;  Surgeon: Danie Binder, MD;  Location: [facility] ENDO SUITE;  Service: Endoscopy;  Laterality: N/A;  2:00 PM - moved to 1:00 - office notified pt  . ESOPHAGOGASTRODUODENOSCOPY  2008   Dr. Oneida Alar: normal esophagus, antral erythema, 1 cm clean based duodenal ulcera, negative H. plyori  . ESOPHAGOGASTRODUODENOSCOPY N/A 01/30/2016   Procedure: ESOPHAGOGASTRODUODENOSCOPY (EGD);  Surgeon: Danie Binder, MD;  Location: [facility];  Service: Endoscopy;  Laterality: N/A;  . HERNIA REPAIR  2001  . PACEMAKER IMPLANT N/A 02/07/2017   Procedure: PACEMAKER IMPLANT;  Surgeon: Constance Haw, MD;  Location: [facility];  Service: Cardiovascular;  Laterality: N/A;  . PORTACATH PLACEMENT  2008     SOCIAL HISTORY:  Social History   Socioeconomic History  . Marital status: Married    Spouse name: Not on file  . Number of children: Not on file  . Years of education: Not on file  . Highest education level: Not on file  Occupational History  . Not on file  Social Needs  . Financial resource strain: Not on file  . Food insecurity:    Worry: Not on file    Inability: Not on file  . Transportation needs:    Medical: Not on file    Non-medical: Not on file  Tobacco Use  . Smoking status: Former Smoker    Last attempt to quit: 09/11/1977    Years since quitting: 40.2  . Smokeless tobacco: Former Systems developer    Quit date: 04/25/1967  Substance and Sexual Activity  . Alcohol use: No  . Drug use: No  . Sexual activity: Not on file  Lifestyle  . Physical activity:    Days per week: Not on file    Minutes per session: Not on file  . Stress: Not on file  Relationships  . Social connections:    Talks on phone: Not on file    Gets together: Not on file    Attends religious service: Not on  file    Active member of club or organization: Not on file    Attends meetings of clubs or organizations: Not on file    Relationship status: Not on file  . Intimate partner violence:    Fear of current or ex partner: Not on file    Emotionally abused: Not on file    Physically abused: Not on file    Forced sexual activity: Not on file  Other Topics Concern  . Not on file  Social History Narrative  . Not on file    FAMILY HISTORY:  Family History  Problem Relation Age of Onset  . Diabetes Father   . Colon cancer Neg Hx     CURRENT MEDICATIONS:  Outpatient Encounter Medications as of 12/05/2017  Medication Sig  . ACCU-CHEK AVIVA PLUS test strip   . ACCU-CHEK SOFTCLIX LANCETS lancets   . allopurinol (ZYLOPRIM) 100 MG tablet Take 1 tablet (100 mg total) by mouth daily.  . cholecalciferol (VITAMIN D) 400 units TABS tablet Take 400 Units by mouth daily.  . feeding supplement, ENSURE ENLIVE, (ENSURE ENLIVE) LIQD Take 237 mLs by mouth 2 (two) times daily between meals.  . ibrutinib (IMBRUVICA) 140 MG capsul Take 140 mg PO daily x 7 days and then increase to 280 mg PO daily thereafter (Patient taking differently: Take 140-280 mg by mouth See admin instructions. Take 140 mg  daily x 7 days and then increase to 280 mg daily thereafter starting on 11/22/2017)  . levETIRAcetam (KEPPRA) 100 MG/ML solution Take 7.5 mLs (750 mg total) by mouth 2 (two) times daily.  . magic mouthwash w/lidocaine SOLN Take 5 mLs by mouth 4 (four) times daily as needed for mouth pain.  . rosuvastatin (CRESTOR) 40 MG tablet Take 40 mg by mouth at bedtime.   . sevelamer carbonate (RENVELA) 800 MG tablet Take 2 tablets (1,600 mg total) by mouth 3 (three) times daily with  meals. (Patient taking differently: Take 800 mg by mouth 2 (two) times daily with a meal. )  . sitaGLIPtin (JANUVIA) 100 MG tablet Take 100 mg by mouth daily.  . sodium bicarbonate 650 MG tablet TAKE 1 TABLET BY MOUTH TWICE A DAY AS DIRECTED  . sodium  polystyrene (KAYEXALATE) powder   . vitamin C (ASCORBIC ACID) 500 MG tablet Take 500 mg by mouth daily.   Facility-Administered Encounter Medications as of 12/05/2017  Medication  . heparin lock flush 100 unit/mL  . sodium chloride flush (NS) 0.9 % injection 10 mL  . sodium polystyrene (KAYEXALATE) 15 GM/60ML suspension 30 g    ALLERGIES:  Allergies  Allergen Reactions  . Aspirin Other (See Comments)    Bleeding ulcers.   . Ibuprofen Other (See Comments)    Bleeding ulcers.     PHYSICAL EXAM:  ECOG Performance status: 1  VITAL SIGNS: BP: 117/68, P:89, R:18, TEMP: 97.9, 02: 100%  Physical Exam HEENT: Oropharynx has no thrush or mucositis Abdomen: No palpable hepatospleno megaly. Extremities: 1+ edema bilaterally. Skin: There is bruising on the upper and lower extremities.  LABORATORY DATA:  I have reviewed the labs as listed.  CBC    Component Value Date/Time   WBC 204.2 (HH) 12/05/2017 0809   RBC 2.51 (L) 12/05/2017 0809   HGB 8.4 (L) 12/05/2017 0809   HCT 27.5 (L) 12/05/2017 0809   PLT 41 (L) 12/05/2017 0809   MCV 109.6 (H) 12/05/2017 0809   MCH 33.5 12/05/2017 0809   MCHC 30.5 12/05/2017 0809   RDW 23.1 (H) 12/05/2017 0809   LYMPHSABS 202.2 (H) 12/05/2017 0809   MONOABS 2.0 (H) 12/05/2017 0809   EOSABS 0.0 12/05/2017 0809   BASOSABS 0.0 12/05/2017 0809   CMP Latest Ref Rng & Units 12/05/2017 12/02/2017 11/28/2017  Glucose 70 - 99 mg/dL 128(H) 124(H) 138(H)  BUN 8 - 23 mg/dL 22 26(H) 33(H)  Creatinine 0.61 - 1.24 mg/dL 1.43(H) 1.52(H) 1.46(H)  Sodium 135 - 145 mmol/L 140 140 139  Potassium 3.5 - 5.1 mmol/L 4.5 4.0 5.4(H)  Chloride 98 - 111 mmol/L 108 105 104  CO2 22 - 32 mmol/L 26 28 28   Calcium 8.9 - 10.3 mg/dL 8.0(L) 8.2(L) 9.0  Total Protein 6.5 - 8.1 g/dL 6.5 6.7 6.4(L)  Total Bilirubin 0.3 - 1.2 mg/dL 0.5 0.6 0.5  Alkaline Phos 38 - 126 U/L 92 103 130(H)  AST 15 - 41 U/L 17 26 25   ALT 0 - 44 U/L 15 20 21          ASSESSMENT & PLAN:   CLL  (chronic lymphocytic leukemia) (HCC) 1. Stage IV CLL with history of poor tolerance to past chemotherapies: -Could not tolerate past treatments including FCR in 2010, Imbruvica 2015 (located by large leg rash), Zydelig 3244 (complicated by transaminitis despite dose reduction), Imbruvica rechallenge in 0102 (complicated by infection and progressive fatigue), bendamustine/Rituxan 1 cycle in 7253 (complicated by prolonged and significant cytopenia in addition to tumor lysis syndrome and renal failure).  He could not even tolerate last treatment with obinutuzumab and chlorambucil. -He reports recent onset night sweats, tiredness on exertion and 15 pound weight loss in the last 1 month. -Ibrutinib 140 mg daily started on 11/22/2017 after TLS prophylaxis with rasburicase.  He was hospitalized for the next 2 days for observation with continuous IV fluids and lab monitoring.  He did not develop any tumor lysis syndrome.  He received 1 unit of blood transfusion yesterday prior to discharge. - Ibrutinib was increased to  2 tablets daily on 11/29/2017.  He has noticed easy bruising.  His tumor lysis syndrome labs were within normal limits.  His white count is coming down.  His platelet count is more or less stable.  We will continue close monitoring given his high risk history. -he will come back next week for follow-up and labs.  2.  Left temporal melanoma: -This was reportedly biopsied by Dr. Nevada Crane few months ago and was thought to be melanoma.  Patient developed left preauricular adenopathy in the last few weeks.  Wife also reports that the temporal nodular lesion is increasing in the last 2 to 3 weeks.  He was seen by Dr. Nevada Crane who reportedly removed the temporal lesion on 12/02/2017.  3. TLS prevention: - He received rasburicase.  He was also hospitalized for 2 days with continuous lab monitoring and IV fluids.  He did not develop any tumor lysis syndrome.  He is currently taking allopurinol daily.            Orders placed this encounter:  Orders Placed This Encounter  Procedures  . CBC with Differential/Platelet  . Comprehensive metabolic panel  . Lactate dehydrogenase      Derek Jack, MD [place] [phone]

## 2017-12-05 NOTE — Assessment & Plan Note (Signed)
1. Stage IV CLL with history of poor tolerance to past chemotherapies: -Could not tolerate past treatments including FCR in 2010, Imbruvica 2015 (located by large leg rash), Zydelig 2706 (complicated by transaminitis despite dose reduction), Imbruvica rechallenge in 2376 (complicated by infection and progressive fatigue), bendamustine/Rituxan 1 cycle in 2831 (complicated by prolonged and significant cytopenia in addition to tumor lysis syndrome and renal failure).  He could not even tolerate last treatment with obinutuzumab and chlorambucil. -He reports recent onset night sweats, tiredness on exertion and 15 pound weight loss in the last 1 month. -Ibrutinib 140 mg daily started on 11/22/2017 after TLS prophylaxis with rasburicase.  He was hospitalized for the next 2 days for observation with continuous IV fluids and lab monitoring.  He did not develop any tumor lysis syndrome.  He received 1 unit of blood transfusion yesterday prior to discharge. - Ibrutinib was increased to 2 tablets daily on 11/29/2017.  He has noticed easy bruising.  His tumor lysis syndrome labs were within normal limits.  His white count is coming down.  His platelet count is more or less stable.  We will continue close monitoring given his high risk history. -he will come back next week for follow-up and labs.  2.  Left temporal melanoma: -This was reportedly biopsied by Dr. Nevada Crane few months ago and was thought to be melanoma.  Patient developed left preauricular adenopathy in the last few weeks.  Wife also reports that the temporal nodular lesion is increasing in the last 2 to 3 weeks.  He was seen by Dr. Nevada Crane who reportedly removed the temporal lesion on 12/02/2017.  3. TLS prevention: - He received rasburicase.  He was also hospitalized for 2 days with continuous lab monitoring and IV fluids.  He did not develop any tumor lysis syndrome.  He is currently taking allopurinol daily.

## 2017-12-05 NOTE — Patient Instructions (Signed)
[facility] at [facility]  Discharge Instructions:  You received hydration today.  _______________________________________________________________  Thank you for choosing [facility] at [facility] to provide your oncology and hematology care.  To afford each patient quality time with our providers, please arrive at least 15 minutes before your scheduled appointment.  You need to re-schedule your appointment if you arrive 10 or more minutes late.  We strive to give you quality time with our providers, and arriving late affects you and other patients whose appointments are after yours.  Also, if you no show three or more times for appointments you may be dismissed from the clinic.  Again, thank you for choosing [facility] at [facility]. Our hope is that these requests will allow you access to exceptional care and in a timely manner. _______________________________________________________________  If you have questions after your visit, please contact our office at [phone] between the hours of 8:30 a.m. and 5:00 p.m. Voicemails left after 4:30 p.m. will not be returned until the following business day. _______________________________________________________________  For prescription refill requests, have your pharmacy contact our office. _______________________________________________________________  Recommendations made by the consultant and any test results will be sent to your referring physician. _______________________________________________________________ 

## 2017-12-05 NOTE — Patient Instructions (Addendum)
[place] at [facility] Discharge Instructions  Please come back tomorrow for fluids then follow up with [place] on Tuesday morning for labs and Dr. Visit.   Today you saw Dr. Raliegh Ip.   Thank you for choosing [place] at [facility] to provide your oncology and hematology care.  To afford each patient quality time with our provider, please arrive at least 15 minutes before your scheduled appointment time.   If you have a lab appointment with the [facility] please come in thru the  Main Entrance and check in at the main information desk  You need to re-schedule your appointment should you arrive 10 or more minutes late.  We strive to give you quality time with our providers, and arriving late affects you and other patients whose appointments are after yours.  Also, if you no show three or more times for appointments you may be dismissed from the clinic at the providers discretion.     Again, thank you for choosing [facility].  Our hope is that these requests will decrease the amount of time that you wait before being seen by our physicians.       _____________________________________________________________  Should you have questions after your visit to [facility], please contact our office at [phone] between the hours of 8:30 a.m. and 4:30 p.m.  Voicemails left after 4:30 p.m. will not be returned until the following business day.  For prescription refill requests, have your pharmacy contact our office.       Resources For Cancer Patients and their Caregivers ? American Cancer Society: Can assist with transportation, wigs, general needs, runs Look Good Feel Better.        305 571 0181 ? Cancer Care: Provides financial assistance, online support groups, medication/co-pay assistance.  1-800-813-HOPE 509-061-2830) ? [place] Assists [place] cancer patients and their families through  emotional , educational and financial support.  847-854-2002 ? [place] DSS Where to apply for food stamps, Medicaid and utility assistance. 9727213125 ? [employer]: Transportation to medical appointments. (934) 338-6706 ? Social Security Administration: May apply for disability if have a Stage IV cancer. 785-881-7734 308-641-9552 ? [employer], Disability and Transit Services: Assists with nutrition, care and transit needs. [place] Support Programs:   > Cancer Support Group  2nd Tuesday of the month 1pm-2pm, Journey Room   > Creative Journey  3rd Tuesday of the month 1130am-1pm, Journey Room

## 2017-12-06 ENCOUNTER — Ambulatory Visit (HOSPITAL_COMMUNITY)
Admission: RE | Admit: 2017-12-06 | Discharge: 2017-12-06 | Disposition: A | Discharge status: Home / Self Care | Payer: Medicare Other | Source: Ambulatory Visit | Attending: Hematology | Admitting: Hematology

## 2017-12-06 ENCOUNTER — Inpatient Hospital Stay (HOSPITAL_COMMUNITY): Payer: Medicare Other

## 2017-12-06 ENCOUNTER — Other Ambulatory Visit (HOSPITAL_COMMUNITY): Payer: Medicare Other

## 2017-12-06 VITALS — BP 116/72 | HR 84 | Temp 97.8°F | Resp 16

## 2017-12-06 DIAGNOSIS — E875 Hyperkalemia: Secondary | ICD-10-CM | POA: Diagnosis not present

## 2017-12-06 DIAGNOSIS — Y848 Other medical procedures as the cause of abnormal reaction of the patient, or of later complication, without mention of misadventure at the time of the procedure: Secondary | ICD-10-CM | POA: Diagnosis not present

## 2017-12-06 DIAGNOSIS — T82898A Other specified complication of vascular prosthetic devices, implants and grafts, initial encounter: Secondary | ICD-10-CM | POA: Insufficient documentation

## 2017-12-06 DIAGNOSIS — T82868A Thrombosis of vascular prosthetic devices, implants and grafts, initial encounter: Secondary | ICD-10-CM | POA: Diagnosis not present

## 2017-12-06 DIAGNOSIS — Z950 Presence of cardiac pacemaker: Secondary | ICD-10-CM | POA: Diagnosis not present

## 2017-12-06 DIAGNOSIS — R61 Generalized hyperhidrosis: Secondary | ICD-10-CM | POA: Diagnosis not present

## 2017-12-06 DIAGNOSIS — C9110 Chronic lymphocytic leukemia of B-cell type not having achieved remission: Secondary | ICD-10-CM

## 2017-12-06 DIAGNOSIS — I4892 Unspecified atrial flutter: Secondary | ICD-10-CM | POA: Diagnosis not present

## 2017-12-06 DIAGNOSIS — Z79899 Other long term (current) drug therapy: Secondary | ICD-10-CM | POA: Diagnosis not present

## 2017-12-06 DIAGNOSIS — R599 Enlarged lymph nodes, unspecified: Secondary | ICD-10-CM | POA: Diagnosis not present

## 2017-12-06 DIAGNOSIS — I10 Essential (primary) hypertension: Secondary | ICD-10-CM | POA: Diagnosis not present

## 2017-12-06 DIAGNOSIS — Z85820 Personal history of malignant melanoma of skin: Secondary | ICD-10-CM | POA: Diagnosis not present

## 2017-12-06 DIAGNOSIS — Z87891 Personal history of nicotine dependence: Secondary | ICD-10-CM | POA: Diagnosis not present

## 2017-12-06 DIAGNOSIS — C9190 Lymphoid leukemia, unspecified not having achieved remission: Secondary | ICD-10-CM | POA: Insufficient documentation

## 2017-12-06 DIAGNOSIS — Z9221 Personal history of antineoplastic chemotherapy: Secondary | ICD-10-CM | POA: Diagnosis not present

## 2017-12-06 DIAGNOSIS — E883 Tumor lysis syndrome: Secondary | ICD-10-CM | POA: Diagnosis not present

## 2017-12-06 DIAGNOSIS — E119 Type 2 diabetes mellitus without complications: Secondary | ICD-10-CM | POA: Diagnosis not present

## 2017-12-06 DIAGNOSIS — Z95828 Presence of other vascular implants and grafts: Secondary | ICD-10-CM

## 2017-12-06 MED ORDER — SODIUM CHLORIDE 0.9 % IV SOLN
INTRAVENOUS | Status: DC
  Administered 2017-12-06: 10:00:00 via INTRAVENOUS

## 2017-12-06 MED ORDER — IOPAMIDOL (ISOVUE-300) INJECTION 61%
INTRAVENOUS | Status: AC
  Filled 2017-12-06: qty 50

## 2017-12-06 MED ORDER — SODIUM CHLORIDE 0.9% FLUSH
INTRAVENOUS | Status: AC
  Filled 2017-12-06: qty 10

## 2017-12-06 MED ORDER — HEPARIN SOD (PORK) LOCK FLUSH 100 UNIT/ML IV SOLN
INTRAVENOUS | Status: AC
Start: 2017-12-06 — End: ?
  Filled 2017-12-06: qty 5

## 2017-12-06 NOTE — Progress Notes (Signed)
Hydration fluids given today per orders. Vitals stable and discharged home from clinic ambulatory. Follow up as scheduled.  Dye study to be done after visit to check on port. It does not function properly.

## 2017-12-06 NOTE — Patient Instructions (Signed)
[place] at [facility] Discharge Instructions  IVF given today per orders. Follow up as scheduled.   Thank you for choosing [place] at [facility] to provide your oncology and hematology care.  To afford each patient quality time with our provider, please arrive at least 15 minutes before your scheduled appointment time.   If you have a lab appointment with the [place] [facility] please come in thru the  Main Entrance and check in at the main information desk  You need to re-schedule your appointment should you arrive 10 or more minutes late.  We strive to give you quality time with our providers, and arriving late affects you and other patients whose appointments are after yours.  Also, if you no show three or more times for appointments you may be dismissed from the clinic at the providers discretion.     Again, thank you for choosing [facility].  Our hope is that these requests will decrease the amount of time that you wait before being seen by our physicians.       _____________________________________________________________  Should you have questions after your visit to [facility], please contact our office at [phone] between the hours of 8:00 a.m. and 4:30 p.m.  Voicemails left after 4:00 p.m. will not be returned until the following business day.  For prescription refill requests, have your pharmacy contact our office and allow 72 hours.    Cancer Center Support Programs:   > Cancer Support Group  2nd Tuesday of the month 1pm-2pm, Journey Room

## 2017-12-10 ENCOUNTER — Inpatient Hospital Stay (HOSPITAL_BASED_OUTPATIENT_CLINIC_OR_DEPARTMENT_OTHER): Payer: Medicare Other | Admitting: Hematology

## 2017-12-10 ENCOUNTER — Encounter (HOSPITAL_COMMUNITY): Payer: Self-pay | Admitting: Hematology

## 2017-12-10 ENCOUNTER — Other Ambulatory Visit: Payer: Self-pay

## 2017-12-10 ENCOUNTER — Inpatient Hospital Stay (HOSPITAL_COMMUNITY): Payer: Medicare Other

## 2017-12-10 VITALS — BP 107/74 | HR 80 | Temp 97.9°F | Resp 16 | Wt 117.4 lb

## 2017-12-10 DIAGNOSIS — C9110 Chronic lymphocytic leukemia of B-cell type not having achieved remission: Secondary | ICD-10-CM

## 2017-12-10 DIAGNOSIS — Z9221 Personal history of antineoplastic chemotherapy: Secondary | ICD-10-CM | POA: Diagnosis not present

## 2017-12-10 DIAGNOSIS — I10 Essential (primary) hypertension: Secondary | ICD-10-CM | POA: Diagnosis not present

## 2017-12-10 DIAGNOSIS — Z79899 Other long term (current) drug therapy: Secondary | ICD-10-CM | POA: Diagnosis not present

## 2017-12-10 DIAGNOSIS — Z950 Presence of cardiac pacemaker: Secondary | ICD-10-CM | POA: Diagnosis not present

## 2017-12-10 DIAGNOSIS — E883 Tumor lysis syndrome: Secondary | ICD-10-CM | POA: Diagnosis not present

## 2017-12-10 DIAGNOSIS — I4892 Unspecified atrial flutter: Secondary | ICD-10-CM | POA: Diagnosis not present

## 2017-12-10 DIAGNOSIS — R61 Generalized hyperhidrosis: Secondary | ICD-10-CM

## 2017-12-10 DIAGNOSIS — E875 Hyperkalemia: Secondary | ICD-10-CM | POA: Diagnosis not present

## 2017-12-10 DIAGNOSIS — R599 Enlarged lymph nodes, unspecified: Secondary | ICD-10-CM

## 2017-12-10 DIAGNOSIS — R634 Abnormal weight loss: Secondary | ICD-10-CM

## 2017-12-10 DIAGNOSIS — Z87891 Personal history of nicotine dependence: Secondary | ICD-10-CM | POA: Diagnosis not present

## 2017-12-10 DIAGNOSIS — E119 Type 2 diabetes mellitus without complications: Secondary | ICD-10-CM | POA: Diagnosis not present

## 2017-12-10 DIAGNOSIS — Z85820 Personal history of malignant melanoma of skin: Secondary | ICD-10-CM

## 2017-12-10 LAB — CBC WITH DIFFERENTIAL/PLATELET
Basophils Absolute: 0.8 10*3/uL — ABNORMAL HIGH (ref 0.0–0.1)
Basophils Relative: 0 %
EOS ABS: 0.2 10*3/uL (ref 0.0–0.7)
Eosinophils Relative: 0 %
HEMATOCRIT: 28.4 % — AB (ref 39.0–52.0)
Hemoglobin: 8.1 g/dL — ABNORMAL LOW (ref 13.0–17.0)
Lymphocytes Relative: 99 %
Lymphs Abs: 184.6 10*3/uL (ref 0.7–4.0)
MCH: 32.7 pg (ref 26.0–34.0)
MCHC: 28.5 g/dL — AB (ref 30.0–36.0)
MCV: 114.5 fL — ABNORMAL HIGH (ref 78.0–100.0)
MONO ABS: 2.3 10*3/uL (ref 0.1–1.0)
MONOS PCT: 1 %
Neutro Abs: 0.3 10*3/uL (ref 1.7–7.7)
Neutrophils Relative %: 0 %
Platelets: 70 10*3/uL — ABNORMAL LOW (ref 150–400)
RBC: 2.48 MIL/uL — ABNORMAL LOW (ref 4.22–5.81)
RDW: 25.1 % — AB (ref 11.5–15.5)
WBC: 188.1 10*3/uL (ref 4.0–10.5)

## 2017-12-10 LAB — COMPREHENSIVE METABOLIC PANEL
ALK PHOS: 83 U/L (ref 38–126)
ALT: 11 U/L (ref 0–44)
AST: 16 U/L (ref 15–41)
Albumin: 3.1 g/dL — ABNORMAL LOW (ref 3.5–5.0)
Anion gap: 7 (ref 5–15)
BILIRUBIN TOTAL: 0.5 mg/dL (ref 0.3–1.2)
BUN: 26 mg/dL — ABNORMAL HIGH (ref 8–23)
CALCIUM: 8.3 mg/dL — AB (ref 8.9–10.3)
CO2: 26 mmol/L (ref 22–32)
CREATININE: 1.75 mg/dL — AB (ref 0.61–1.24)
Chloride: 107 mmol/L (ref 98–111)
GFR calc Af Amer: 41 mL/min — ABNORMAL LOW (ref >60)
GFR calc non Af Amer: 35 mL/min — ABNORMAL LOW (ref >60)
Glucose, Bld: 103 mg/dL — ABNORMAL HIGH (ref 70–99)
Potassium: 4.9 mmol/L (ref 3.5–5.1)
SODIUM: 140 mmol/L (ref 135–145)
TOTAL PROTEIN: 7.1 g/dL (ref 6.5–8.1)

## 2017-12-10 LAB — LACTATE DEHYDROGENASE: LDH: 137 U/L (ref 98–192)

## 2017-12-10 MED ORDER — HEPARIN SOD (PORK) LOCK FLUSH 100 UNIT/ML IV SOLN
500.0000 [IU] | Freq: Once | INTRAVENOUS | Status: AC
  Administered 2017-12-10: 500 [IU] via INTRAVENOUS

## 2017-12-10 MED ORDER — SODIUM CHLORIDE 0.9 % IV SOLN
INTRAVENOUS | Status: DC
  Administered 2017-12-10: 10:00:00 via INTRAVENOUS

## 2017-12-10 MED ORDER — SODIUM CHLORIDE 0.9% FLUSH
10.0000 mL | Freq: Once | INTRAVENOUS | Status: AC
  Administered 2017-12-10: 10 mL via INTRAVENOUS

## 2017-12-10 NOTE — Progress Notes (Signed)
Patient tolerated hydration with no complaints voiced.  Port site clean and dry with no swelling or bruising noted.  Band aid applied.  VSs with discharge and left ambulatory with family with no s/s of distress noted.

## 2017-12-10 NOTE — Progress Notes (Signed)
[place], [place] [address]   CLINIC:  Medical Oncology/Hematology  PCP:  [name], MD [place] [address] [phone]   REASON FOR VISIT:  Follow-up for Chronic lymphocytic leukemia (CLL)  CURRENT THERAPY: Ibrutinib 280mg  with allopurinol 300mg  daily. Here today for fluids only    BRIEF ONCOLOGIC HISTORY:    CLL (chronic lymphocytic leukemia) ([place])   02/14/2005 Procedure    Peripheral flow cytometry- CLL (without 11q or 17 p deletion) presenting with stage 0 disease      10/25/2008 Progression    Increasing WBC and symptoms suggestive of progressive disease      11/09/2008 - 01/07/2009 Chemotherapy    Rituxan, fludarabine, cytoxan x 3 cycles with the discontinuation of fludarabine and cytoxan due to severe pancytopenia requiring hospitalization.      01/10/2009 Adverse Reaction    Severe pancytopenia and adverse side effects      02/01/2009 - 04/01/2009 Chemotherapy    Rituxan every 21 days x 3 cycles      04/02/2009 Remission    Stable disease.  Chemotherapy holiday started      06/22/2013 - 06/29/2013 Chemotherapy    Imbruvica daily      06/29/2013 Adverse Reaction    Large right posterior leg (popliteal) ecchymosis/violaceous rash.  Likely Imbruvica-induced      07/31/2013 - 10/12/2013 Chemotherapy    Zydelig (idelalisib) 150 mg BID monotherapy      10/12/2013 Adverse Reaction    Transaminitis, suspected to be secondary to [place].  Dose decreased.      10/12/2013 - 11/09/2013 Chemotherapy    Zydelig (idelalisib) 150 mg daily.      11/09/2013 Adverse Reaction    Worsening transaminitis.  Zydelig on HOLD      11/09/2013 Treatment Plan Change    Hold Zydelig      12/28/2013 Adverse Reaction    Herpes Zoster outbreak.  Partial response with Famvir x 1 week, switched to acyclovir.  Progression over 1-2 weeks with development of keratoconjunctivitis bilaterally.  VZ Immune Globulin 625 mg IM given  on 01/13/14.      02/08/2014 Treatment Plan Change    Ibrutinib restarted 140 mg daily for one week followed by 280 mg daily followed by 420 mg daily with prophylactic acyclovir.      02/08/2014 - 07/05/2014 Chemotherapy    Ibrutinib 420 mg daily      [id number] - 04/17/2014 Hospital Admission    Right lower lobe pneumonia      07/05/2014 Adverse Reaction    Fissuring of fingertips and sore, red feet with transaminitis      07/05/2014 Treatment Plan Change    Hold Ibrutinib      07/22/2014 - 08/19/2014 Chemotherapy    Ibrutinib restarted at 280 mg daily.      08/09/2014 - 08/12/2014 Hospital Admission    Bilateral pneumonia      08/19/2014 Adverse Reaction    Nausea with vomiting.  Escorted to ED.      08/19/2014 Treatment Plan Change    Ibrutinib on hold- ED escort from clinic       10/18/2014 Miscellaneous    B12 weekly x 4 then monthly.      12/15/2015 Imaging    Bone density- BMD as determined from Femur Neck Left is 0.803 g/cm2 with a T-score of -2.1. This patient is considered osteopenic by World Healh Organization (WHO) Criteria.      12/16/2015 Imaging  CT abd/pelvis-  Progression of retroperitoneal and portacaval adenopathy compared to the prior study. Likely related to CLL.  Small hiatal hernia  Mild compression fractures T11 and T12, probably old. Correlate with any pain in this area.      05/22/2016 - 05/28/2016 Chemotherapy    BR day 1 and 2 x 1 cycle.  Bendamustine dose reduced by 50%.      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      01/28/2017 -  Chemotherapy    Cycle 1 day 1 of Gayzva and Chlorambucil. Rasburicase 3 mg given also.       01/28/2017 - 02/08/2017 Hospital Admission    Admitted for sycopal episode due to complete heart  block. Patient went into PEA arrest requiring CPR and intubation. 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 Pacemaker placed while inpatient. Started on eliquis.        INTERVAL HISTORY:  Mr. Mano 82 y.o. male returns for routine follow-up for chronic lymphocytic leukemia. Patient is here today with his wife. Patient looks good today. He still has lower extremity edema. Patients appetite is improving. Patients incision is still bleeding they will be making an appointment with the Dermatologist today to check on the incision. Patient coughed up blood one time this past Sunday has not happened again. Denies any bleeding. Denies any mouth sores. Denies any nausea or vomiting.     REVIEW OF SYSTEMS:  Review of Systems  Constitutional: Negative.   HENT:  Negative.   Eyes: Negative.   Respiratory: Negative.   Cardiovascular: Positive for leg swelling.  Gastrointestinal: Negative.   Endocrine: Negative.   Genitourinary: Negative.    Musculoskeletal: Negative.   Skin: Negative.   Neurological: Negative.   Hematological: Bruises/bleeds easily.  Psychiatric/Behavioral: Negative.      PAST MEDICAL/SURGICAL HISTORY:  Past Medical History:  Diagnosis Date  . Anemia   . B12 deficiency 12/07/2014  . CLL (chronic lymphocytic leukemia) ([place]) 01/24/2011  . Diabetes mellitus   . DM (diabetes mellitus) ([place]) 01/24/2011  . Eczema 01/24/2011  . History of pneumonia 04/2014  . Hypertension   . Leukemia ([place]) 6.22.2012   PER PATIENT  . Melanoma in situ ([place]) 01/24/2011  . Mild obesity 01/24/2011  . Port catheter in place 05/02/2012  . Shingles   . Ulcer    Past Surgical History:  Procedure Laterality Date  . bleeding ulcer    . CATARACT EXTRACTION W/PHACO  05/05/2012   Procedure: CATARACT EXTRACTION PHACO AND INTRAOCULAR LENS PLACEMENT (IOC);  Surgeon: Tonny Branch, MD;  Location: [facility] ORS;  Service: Ophthalmology;  Laterality: Right;  CDE:13.25  . CATARACT  EXTRACTION W/PHACO  05/15/2012   Procedure: CATARACT EXTRACTION PHACO AND INTRAOCULAR LENS PLACEMENT (IOC);  Surgeon: Tonny Branch, MD;  Location: [facility] ORS;  Service: Ophthalmology;  Laterality: Left;  CDE:  12.32  . COLONOSCOPY N/A 01/30/2016   Procedure: COLONOSCOPY;  Surgeon: Danie Binder, MD;  Location: [facility] ENDO SUITE;  Service: Endoscopy;  Laterality: N/A;  2:00 PM - moved to 1:00 - office notified pt  . ESOPHAGOGASTRODUODENOSCOPY  2008   Dr. Oneida Alar: normal esophagus, antral erythema, 1 cm clean based duodenal ulcera, negative H. plyori  . ESOPHAGOGASTRODUODENOSCOPY N/A 01/30/2016   Procedure: ESOPHAGOGASTRODUODENOSCOPY (EGD);  Surgeon: Danie Binder, MD;  Location: [facility] END[facility] SU[facility];  Service: Endoscopy;  Laterality: N/A;  . HERNIA REPAIR  2001  . PACEMAKER IMPLANT N/A 02/07/2017   Procedure: PACEMAKER IMPLANT;  Surgeon: Constance Haw, MD;  Location: [facility];  Service: Cardiovascular;  Laterality: N/A;  . PORTACATH PLACEMENT  2008     SOCIAL HISTORY:  Social History   Socioeconomic History  . Marital status: Married    Spouse name: Not on file  . Number of children: Not on file  . Years of education: Not on file  . Highest education level: Not on file  Occupational History  . Not on file  Social Needs  . Financial resource strain: Not on file  . Food insecurity:    Worry: Not on file    Inability: Not on file  . Transportation needs:    Medical: Not on file    Non-medical: Not on file  Tobacco Use  . Smoking status: Former Smoker    Last attempt to quit: 09/11/1977    Years since quitting: 40.2  . Smokeless tobacco: Former Systems developer    Quit date: 04/25/1967  Substance and Sexual Activity  . Alcohol use: No  . Drug use: No  . Sexual activity: Not on file  Lifestyle  . Physical activity:    Days per week: Not on file    Minutes per session: Not on file  . Stress: Not on file  Relationships  . Social connections:    Talks on phone: Not on file    Gets together:  Not on file    Attends religious service: Not on file    Active member of club or organization: Not on file    Attends meetings of clubs or organizations: Not on file    Relationship status: Not on file  . Intimate partner violence:    Fear of current or ex partner: Not on file    Emotionally abused: Not on file    Physically abused: Not on file    Forced sexual activity: Not on file  Other Topics Concern  . Not on file  Social History Narrative  . Not on file    FAMILY HISTORY:  Family History  Problem Relation Age of Onset  . Diabetes Father   . Colon cancer Neg Hx     CURRENT MEDICATIONS:  Outpatient Encounter Medications as of 12/10/2017  Medication Sig  . ACCU-CHEK AVIVA PLUS test strip   . ACCU-CHEK SOFTCLIX LANCETS lancets   . allopurinol (ZYLOPRIM) 100 MG tablet Take 1 tablet (100 mg total) by mouth daily.  . cholecalciferol (VITAMIN D) 400 units TABS tablet Take 400 Units by mouth daily.  [name] docusate sodium (COLACE) 100 MG capsule Take 100 mg by mouth 2 (two) times daily.  [name] doxycycline (VIBRAMYCIN) 100 MG capsule Take 100 mg by mouth 2 (two) times daily.  . feeding supplement, ENSURE ENLIVE, (ENSURE ENLIVE) LIQD Take 237 mLs by mouth 2 (two) times daily between meals.  . ibrutinib (IMBRUVICA) 140 MG capsul Take 140 mg PO daily x 7 days and then increase to 280 mg PO daily thereafter (Patient taking differently: Take 140-280 mg by mouth See admin instructions. Take 140 mg  daily x 7 days and then increase to 280 mg daily thereafter starting on 11/22/2017)  . levETIRAcetam (KEPPRA) 100 MG/ML solution Take 7.5 mLs (750 mg total) by mouth 2 (two) times daily.  . magic mouthwash w/lidocaine SOLN Take 5 mLs by mouth 4 (four) times daily as needed for mouth pain.  . rosuvastatin (CRESTOR) 40 MG  tablet Take 40 mg by mouth at bedtime.   . sevelamer carbonate (RENVELA) 800 MG tablet Take 2 tablets (1,600 mg total) by mouth 3 (three) times daily with meals. (Patient taking  differently: Take 800 mg by mouth 2 (two) times daily with a meal. )  . sitaGLIPtin (JANUVIA) 100 MG tablet Take 100 mg by mouth daily.  . sodium bicarbonate 650 MG tablet TAKE 1 TABLET BY MOUTH TWICE A DAY AS DIRECTED  . sodium polystyrene (KAYEXALATE) powder   . vitamin C (ASCORBIC ACID) 500 MG tablet Take 500 mg by mouth daily.   Facility-Administered Encounter Medications as of 12/10/2017  Medication  . heparin lock flush 100 unit/mL  . sodium chloride flush (NS) 0.9 % injection 10 mL  . sodium polystyrene (KAYEXALATE) 15 GM/60ML suspension 30 g    ALLERGIES:  Allergies  Allergen Reactions  . Aspirin Other (See Comments)    Bleeding ulcers.   . Ibuprofen Other (See Comments)    Bleeding ulcers.     PHYSICAL EXAM:  ECOG Performance status: 1  Vitals:   12/10/17 0912  BP: 107/74  Pulse: 80  Resp: 16  Temp: 97.9 F (36.6 C)  SpO2: 99%   Filed Weights   12/10/17 0912  Weight: 117 lb 6.4 oz (53.3 kg)    Physical Exam  Constitutional: He is oriented to person, place, and time.  Cardiovascular: Normal rate, regular rhythm and normal heart sounds.  Pulmonary/Chest: Effort normal and breath sounds normal.  Musculoskeletal: He exhibits edema (bilateral lower extremities).  Neurological: He is alert and oriented to person, place, and time.  Skin: Skin is warm and dry.     LABORATORY DATA:  I have reviewed the labs as listed.  CBC    Component Value Date/Time   WBC 188.1 (HH) 12/10/2017 0810   RBC 2.48 (L) 12/10/2017 0810   HGB 8.1 (L) 12/10/2017 0810   HCT 28.4 (L) 12/10/2017 0810   PLT 70 (L) 12/10/2017 0810   MCV 114.5 (H) 12/10/2017 0810   MCH 32.7 12/10/2017 0810   MCHC 28.5 (L) 12/10/2017 0810   RDW 25.1 (H) 12/10/2017 0810   LYMPHSABS 184.6 12/10/2017 0810   MONOABS 2.3 12/10/2017 0810   EOSABS 0.2 12/10/2017 0810   BASOSABS 0.8 (H) 12/10/2017 0810   CMP Latest Ref Rng & Units 12/10/2017 12/05/2017 12/02/2017  Glucose 70 - 99 mg/dL 103(H) 128(H) 124(H)   BUN 8 - 23 mg/dL 26(H) 22 26(H)  Creatinine 0.61 - 1.24 mg/dL 1.75(H) 1.43(H) 1.52(H)  Sodium 135 - 145 mmol/L 140 140 140  Potassium 3.5 - 5.1 mmol/L 4.9 4.5 4.0  Chloride 98 - 111 mmol/L 107 108 105  CO2 22 - 32 mmol/L 26 26 28   Calcium 8.9 - 10.3 mg/dL 8.3(L) 8.0(L) 8.2(L)  Total Protein 6.5 - 8.1 g/dL 7.1 6.5 6.7  Total Bilirubin 0.3 - 1.2 mg/dL 0.5 0.5 0.6  Alkaline Phos 38 - 126 U/L 83 92 103  AST 15 - 41 U/L 16 17 26   ALT 0 - 44 U/L 11 15 20          ASSESSMENT & PLAN:   CLL (chronic lymphocytic leukemia) (HCC) 1. Stage IV CLL with history of poor tolerance to past chemotherapies: -Could not tolerate past treatments including FCR in 2010, Imbruvica 2015 (located by large leg rash), Zydelig 2637 (complicated by transaminitis despite dose reduction), Imbruvica rechallenge in 8588 (complicated by infection and progressive fatigue), bendamustine/Rituxan 1 cycle in 5027 (complicated by prolonged and significant cytopenia in addition to  tumor lysis syndrome and renal failure).  He could not even tolerate last treatment with obinutuzumab and chlorambucil. -He reports recent onset night sweats, tiredness on exertion and 15 pound weight loss in the last 1 month. -Ibrutinib 140 mg daily started on 11/22/2017 after TLS prophylaxis with rasburicase.  He was hospitalized for the next 2 days for observation with continuous IV fluids and lab monitoring.  He did not develop any tumor lysis syndrome.  He received 1 unit of blood transfusion yesterday prior to discharge. - Ibrutinib was increased to 2 tablets daily on 11/29/2017.  He has noticed easy bruising.  His tumor lysis syndrome labs were within normal limits. - He is getting a response with improvement in his white count and platelet count.  However his hemoglobin dropped to 8.1.  Ibrutinib can rarely cause anemia.  We will keep a close eye on it.  We will check his labs on Thursday to see if he needs any blood transfusion.  We will transfuse  1 unit if his hemoglobin drops below 8.  We will reevaluate him in 1 week. -His creatinine went up slightly to 1.6 today.  I will give him 1 L of fluids.  2.  Left temporal melanoma: -This was reportedly biopsied by Dr. Nevada Crane few months ago and was thought to be melanoma.  Patient developed left preauricular adenopathy in the last few weeks.  Wife also reports that the temporal nodular lesion is increasing in the last 2 to 3 weeks.  He was seen by Dr. [place] Crane who reportedly removed the temporal lesion on 12/02/2017.  3. TLS prevention: - He received rasburicase and was hospitalized for 2 days with continuous lab monitoring and IV fluids.  He did not develop any tumor lysis syndrome.  He is currently taking allopurinol daily.           Orders placed this encounter:  Orders Placed This Encounter  Procedures  . CBC with Differential  . Magnesium  . CBC with Differential/Platelet  . Comprehensive metabolic panel      Derek Jack, MD [place] [phone]

## 2017-12-10 NOTE — Patient Instructions (Signed)
[facility] at [facility]  Discharge Instructions:  You received hydration today.  _______________________________________________________________  Thank you for choosing [facility] at [facility] to provide your oncology and hematology care.  To afford each patient quality time with our providers, please arrive at least 15 minutes before your scheduled appointment.  You need to re-schedule your appointment if you arrive 10 or more minutes late.  We strive to give you quality time with our providers, and arriving late affects you and other patients whose appointments are after yours.  Also, if you no show three or more times for appointments you may be dismissed from the clinic.  Again, thank you for choosing [facility] at [facility]. Our hope is that these requests will allow you access to exceptional care and in a timely manner. _______________________________________________________________  If you have questions after your visit, please contact our office at [phone] between the hours of 8:30 a.m. and 5:00 p.m. Voicemails left after 4:30 p.m. will not be returned until the following business day. _______________________________________________________________  For prescription refill requests, have your pharmacy contact our office. _______________________________________________________________  Recommendations made by the consultant and any test results will be sent to your referring physician. _______________________________________________________________ 

## 2017-12-10 NOTE — Progress Notes (Signed)
CRITICAL VALUE STICKER  CRITICAL VALUE: WBC 188.1  RECEIVER (on-site recipient of call):  Charlyne Petrin, RN  DATE & TIME NOTIFIED: [phone]  MESSENGER (representative from lab): Alwyn Pea  MD NOTIFIED: Delton Coombes  TIME OF NOTIFICATION: 0900  RESPONSE: Patient seen by oncologist for follow up visit.  Doctor aware of lab values and medication Imbruvica.

## 2017-12-10 NOTE — Assessment & Plan Note (Signed)
1. Stage IV CLL with history of poor tolerance to past chemotherapies: -Could not tolerate past treatments including FCR in 2010, Imbruvica 2015 (located by large leg rash), Zydelig 9622 (complicated by transaminitis despite dose reduction), Imbruvica rechallenge in 2979 (complicated by infection and progressive fatigue), bendamustine/Rituxan 1 cycle in 8921 (complicated by prolonged and significant cytopenia in addition to tumor lysis syndrome and renal failure).  He could not even tolerate last treatment with obinutuzumab and chlorambucil. -He reports recent onset night sweats, tiredness on exertion and 15 pound weight loss in the last 1 month. -Ibrutinib 140 mg daily started on 11/22/2017 after TLS prophylaxis with rasburicase.  He was hospitalized for the next 2 days for observation with continuous IV fluids and lab monitoring.  He did not develop any tumor lysis syndrome.  He received 1 unit of blood transfusion yesterday prior to discharge. - Ibrutinib was increased to 2 tablets daily on 11/29/2017.  He has noticed easy bruising.  His tumor lysis syndrome labs were within normal limits. - He is getting a response with improvement in his white count and platelet count.  However his hemoglobin dropped to 8.1.  Ibrutinib can rarely cause anemia.  We will keep a close eye on it.  We will check his labs on Thursday to see if he needs any blood transfusion.  We will transfuse 1 unit if his hemoglobin drops below 8.  We will reevaluate him in 1 week. -His creatinine went up slightly to 1.6 today.  I will give him 1 L of fluids.  2.  Left temporal melanoma: -This was reportedly biopsied by Dr. Nevada Crane few months ago and was thought to be melanoma.  Patient developed left preauricular adenopathy in the last few weeks.  Wife also reports that the temporal nodular lesion is increasing in the last 2 to 3 weeks.  He was seen by Dr. Nevada Crane who reportedly removed the temporal lesion on 12/02/2017.  3. TLS prevention: -  He received rasburicase and was hospitalized for 2 days with continuous lab monitoring and IV fluids.  He did not develop any tumor lysis syndrome.  He is currently taking allopurinol daily.

## 2017-12-12 ENCOUNTER — Inpatient Hospital Stay (HOSPITAL_COMMUNITY): Payer: Medicare Other | Attending: Nurse Practitioner

## 2017-12-12 ENCOUNTER — Other Ambulatory Visit (HOSPITAL_COMMUNITY): Payer: Medicare Other

## 2017-12-12 DIAGNOSIS — E875 Hyperkalemia: Secondary | ICD-10-CM | POA: Diagnosis not present

## 2017-12-12 DIAGNOSIS — I4892 Unspecified atrial flutter: Secondary | ICD-10-CM | POA: Insufficient documentation

## 2017-12-12 DIAGNOSIS — Z87891 Personal history of nicotine dependence: Secondary | ICD-10-CM | POA: Diagnosis not present

## 2017-12-12 DIAGNOSIS — R599 Enlarged lymph nodes, unspecified: Secondary | ICD-10-CM | POA: Insufficient documentation

## 2017-12-12 DIAGNOSIS — C4339 Malignant melanoma of other parts of face: Secondary | ICD-10-CM | POA: Insufficient documentation

## 2017-12-12 DIAGNOSIS — E119 Type 2 diabetes mellitus without complications: Secondary | ICD-10-CM | POA: Diagnosis not present

## 2017-12-12 DIAGNOSIS — Z79899 Other long term (current) drug therapy: Secondary | ICD-10-CM | POA: Insufficient documentation

## 2017-12-12 DIAGNOSIS — D509 Iron deficiency anemia, unspecified: Secondary | ICD-10-CM | POA: Diagnosis not present

## 2017-12-12 DIAGNOSIS — Z85820 Personal history of malignant melanoma of skin: Secondary | ICD-10-CM | POA: Insufficient documentation

## 2017-12-12 DIAGNOSIS — E559 Vitamin D deficiency, unspecified: Secondary | ICD-10-CM | POA: Diagnosis not present

## 2017-12-12 DIAGNOSIS — R61 Generalized hyperhidrosis: Secondary | ICD-10-CM | POA: Insufficient documentation

## 2017-12-12 DIAGNOSIS — C9110 Chronic lymphocytic leukemia of B-cell type not having achieved remission: Secondary | ICD-10-CM | POA: Diagnosis not present

## 2017-12-12 DIAGNOSIS — E883 Tumor lysis syndrome: Secondary | ICD-10-CM | POA: Insufficient documentation

## 2017-12-12 DIAGNOSIS — R634 Abnormal weight loss: Secondary | ICD-10-CM | POA: Insufficient documentation

## 2017-12-12 DIAGNOSIS — R809 Proteinuria, unspecified: Secondary | ICD-10-CM | POA: Diagnosis not present

## 2017-12-12 DIAGNOSIS — Z9221 Personal history of antineoplastic chemotherapy: Secondary | ICD-10-CM | POA: Diagnosis not present

## 2017-12-12 DIAGNOSIS — Z950 Presence of cardiac pacemaker: Secondary | ICD-10-CM | POA: Insufficient documentation

## 2017-12-12 DIAGNOSIS — E669 Obesity, unspecified: Secondary | ICD-10-CM | POA: Diagnosis not present

## 2017-12-12 DIAGNOSIS — I10 Essential (primary) hypertension: Secondary | ICD-10-CM | POA: Diagnosis not present

## 2017-12-12 DIAGNOSIS — N183 Chronic kidney disease, stage 3 (moderate): Secondary | ICD-10-CM | POA: Diagnosis not present

## 2017-12-12 LAB — CBC WITH DIFFERENTIAL/PLATELET
Basophils Absolute: 0.4 10*3/uL — ABNORMAL HIGH (ref 0.0–0.1)
Basophils Relative: 0 %
Eosinophils Absolute: 0.2 10*3/uL (ref 0.0–0.7)
Eosinophils Relative: 0 %
HCT: 26.7 % — ABNORMAL LOW (ref 39.0–52.0)
Hemoglobin: 7.5 g/dL — ABNORMAL LOW (ref 13.0–17.0)
Lymphocytes Relative: 99 %
Lymphs Abs: 140.9 10*3/uL — ABNORMAL HIGH (ref 0.7–4.0)
MCH: 31.8 pg (ref 26.0–34.0)
MCHC: 28.1 g/dL — ABNORMAL LOW (ref 30.0–36.0)
MCV: 113.1 fL — ABNORMAL HIGH (ref 78.0–100.0)
Monocytes Absolute: 0.3 10*3/uL (ref 0.1–1.0)
Monocytes Relative: 0 %
Neutro Abs: 1.3 10*3/uL — ABNORMAL LOW (ref 1.7–7.7)
Neutrophils Relative %: 1 %
Platelets: 79 10*3/uL — ABNORMAL LOW (ref 150–400)
RBC: 2.36 MIL/uL — ABNORMAL LOW (ref 4.22–5.81)
RDW: 24.3 % — ABNORMAL HIGH (ref 11.5–15.5)
WBC: 143 10*3/uL (ref 4.0–10.5)

## 2017-12-13 ENCOUNTER — Inpatient Hospital Stay (HOSPITAL_COMMUNITY): Payer: Medicare Other

## 2017-12-13 ENCOUNTER — Other Ambulatory Visit: Payer: Self-pay | Admitting: Internal Medicine

## 2017-12-13 ENCOUNTER — Ambulatory Visit (HOSPITAL_COMMUNITY): Payer: Medicare Other

## 2017-12-13 ENCOUNTER — Other Ambulatory Visit (HOSPITAL_COMMUNITY): Payer: Medicare Other

## 2017-12-13 DIAGNOSIS — E119 Type 2 diabetes mellitus without complications: Secondary | ICD-10-CM | POA: Diagnosis not present

## 2017-12-13 DIAGNOSIS — C4339 Malignant melanoma of other parts of face: Secondary | ICD-10-CM | POA: Diagnosis not present

## 2017-12-13 DIAGNOSIS — Z79899 Other long term (current) drug therapy: Secondary | ICD-10-CM | POA: Diagnosis not present

## 2017-12-13 DIAGNOSIS — E883 Tumor lysis syndrome: Secondary | ICD-10-CM | POA: Diagnosis not present

## 2017-12-13 DIAGNOSIS — I10 Essential (primary) hypertension: Secondary | ICD-10-CM | POA: Diagnosis not present

## 2017-12-13 DIAGNOSIS — C9110 Chronic lymphocytic leukemia of B-cell type not having achieved remission: Secondary | ICD-10-CM | POA: Diagnosis not present

## 2017-12-13 DIAGNOSIS — Z87891 Personal history of nicotine dependence: Secondary | ICD-10-CM | POA: Diagnosis not present

## 2017-12-13 DIAGNOSIS — Z9221 Personal history of antineoplastic chemotherapy: Secondary | ICD-10-CM | POA: Diagnosis not present

## 2017-12-13 DIAGNOSIS — J189 Pneumonia, unspecified organism: Secondary | ICD-10-CM

## 2017-12-13 DIAGNOSIS — Z85820 Personal history of malignant melanoma of skin: Secondary | ICD-10-CM | POA: Diagnosis not present

## 2017-12-13 DIAGNOSIS — R599 Enlarged lymph nodes, unspecified: Secondary | ICD-10-CM | POA: Diagnosis not present

## 2017-12-13 DIAGNOSIS — E875 Hyperkalemia: Secondary | ICD-10-CM | POA: Diagnosis not present

## 2017-12-13 DIAGNOSIS — Z09 Encounter for follow-up examination after completed treatment for conditions other than malignant neoplasm: Secondary | ICD-10-CM

## 2017-12-13 DIAGNOSIS — Z950 Presence of cardiac pacemaker: Secondary | ICD-10-CM | POA: Diagnosis not present

## 2017-12-13 DIAGNOSIS — R61 Generalized hyperhidrosis: Secondary | ICD-10-CM | POA: Diagnosis not present

## 2017-12-13 DIAGNOSIS — I4892 Unspecified atrial flutter: Secondary | ICD-10-CM | POA: Diagnosis not present

## 2017-12-13 LAB — PREPARE RBC (CROSSMATCH)

## 2017-12-13 LAB — IRON AND TIBC
Iron: 141 ug/dL (ref 45–182)
SATURATION RATIOS: 70 % — AB (ref 17.9–39.5)
TIBC: 200 ug/dL — AB (ref 250–450)
UIBC: 59 ug/dL

## 2017-12-13 LAB — FERRITIN: Ferritin: 600 ng/mL — ABNORMAL HIGH (ref 24–336)

## 2017-12-13 MED ORDER — SODIUM CHLORIDE 0.9% IV SOLUTION
250.0000 mL | Freq: Once | INTRAVENOUS | Status: AC
  Administered 2017-12-13: 250 mL via INTRAVENOUS

## 2017-12-13 MED ORDER — HEPARIN SOD (PORK) LOCK FLUSH 100 UNIT/ML IV SOLN
INTRAVENOUS | Status: AC
  Filled 2017-12-13: qty 5

## 2017-12-13 MED ORDER — DIPHENHYDRAMINE HCL 25 MG PO CAPS
25.0000 mg | ORAL_CAPSULE | Freq: Once | ORAL | Status: AC
  Administered 2017-12-13: 25 mg via ORAL

## 2017-12-13 MED ORDER — ACETAMINOPHEN 325 MG PO TABS
ORAL_TABLET | ORAL | Status: AC
  Filled 2017-12-13: qty 2

## 2017-12-13 MED ORDER — ACETAMINOPHEN 325 MG PO TABS
650.0000 mg | ORAL_TABLET | Freq: Once | ORAL | Status: AC
  Administered 2017-12-13: 650 mg via ORAL

## 2017-12-13 MED ORDER — DIPHENHYDRAMINE HCL 25 MG PO CAPS
ORAL_CAPSULE | ORAL | Status: AC
  Filled 2017-12-13: qty 1

## 2017-12-13 MED ORDER — HEPARIN SOD (PORK) LOCK FLUSH 100 UNIT/ML IV SOLN
500.0000 [IU] | Freq: Once | INTRAVENOUS | Status: AC
  Administered 2017-12-13: 500 [IU] via INTRAVENOUS

## 2017-12-13 MED ORDER — SODIUM CHLORIDE 0.9% FLUSH
10.0000 mL | Freq: Once | INTRAVENOUS | Status: AC
  Administered 2017-12-13: 10 mL via INTRAVENOUS

## 2017-12-13 NOTE — Patient Instructions (Signed)

## 2017-12-13 NOTE — Progress Notes (Signed)
Nutrition Assessment   Reason for Assessment:   Patient identified on Malnutrition Screening tool for weight loss and poor appetite  ASSESSMENT:  82 year old male with stage IV CLL and left temporal melanoma removed 7/22. Past medical history of DM, HTN, CKD.  Met with patient and wife today.  Wife reports patient appetite is good one day but eats bites on other days.  Reports that he has been drinking 2 glucerna shakes since being out of the hospital. Reports that breakfast is egg and toast some mornings with bacon or sausage 1 time per month or oatmeal, sometimes fixes a biscuit with gravy.  Wife tries to limit sodium and monitor blood glucose.  Reports lunch is mainly vegetables, likes dried beans.  Supper is similar to lunch although last night did have chopped steak and he ate all of it.  Wife reports that he does eat meat (red, chicken, fish) but she has to chop if real fine due to patient unable to chew meats well.    Nutrition Focused Physical Exam: deferred  Medications: colace, vit c, januvia  Labs: BUN 26, creatinine 1.75  Anthropometrics:   Height: 61 inches Weight: 117 lb 6.6 oz UBW: 125 lb (noted on 09/11/17) BMI: 22  6% weight loss in the last 3 months  Estimated Energy Needs  Kcals: 1600-1800 calories Protein: 80-90 g/d Fluid: 1.8 L/d  NUTRITION DIAGNOSIS: Unintentional weight loss related to poor appetite, cancer, recent hospital admission as evidenced by 6% weight loss in 3 months   INTERVENTION:  Discussed strategies to increase calories and protein.  Discussed soft moist protein foods and handout provided with examples. Recommend drinking oral nutrition supplement with 350 calories vs glucerna with 180 calories) due to poor appetite and weight loss.  Would recommend covering elevated blood glucose if that happens with medication vs restricting intake at this time. Discussed with wife.  Encouraged wife to continue to monitor blood glucose and communicate with  PCP blood glucose elevations.   Provided patient with 1st complimentary case of ensure enlive today.    MONITORING, EVALUATION, GOAL: weight trends, intake   NEXT VISIT: August 16  Ritika Hellickson B. Zenia Resides, [place], [place] Registered Dietitian [phone] (pager)

## 2017-12-13 NOTE — Progress Notes (Signed)
Patient tolerated blood transfusion with no complaints voiced.  Port clean and dry with no bruising or swelling noted at site.  Band aid applied.  VSS with discharge and left with family with no s/s of distress noted.

## 2017-12-14 LAB — TYPE AND SCREEN
ABO/RH(D): AB POS
Antibody Screen: NEGATIVE
UNIT DIVISION: 0

## 2017-12-14 LAB — BPAM RBC
BLOOD PRODUCT EXPIRATION DATE: 201908202359
ISSUE DATE / TIME: 201908021035
Unit Type and Rh: 6200

## 2017-12-16 ENCOUNTER — Inpatient Hospital Stay (HOSPITAL_BASED_OUTPATIENT_CLINIC_OR_DEPARTMENT_OTHER): Payer: Medicare Other | Admitting: Hematology

## 2017-12-16 ENCOUNTER — Encounter (HOSPITAL_COMMUNITY): Payer: Self-pay | Admitting: Hematology

## 2017-12-16 ENCOUNTER — Inpatient Hospital Stay (HOSPITAL_COMMUNITY): Payer: Medicare Other

## 2017-12-16 ENCOUNTER — Other Ambulatory Visit: Payer: Self-pay

## 2017-12-16 VITALS — BP 113/67 | HR 87 | Temp 98.1°F | Resp 16 | Wt 116.3 lb

## 2017-12-16 DIAGNOSIS — Z85820 Personal history of malignant melanoma of skin: Secondary | ICD-10-CM | POA: Diagnosis not present

## 2017-12-16 DIAGNOSIS — C9110 Chronic lymphocytic leukemia of B-cell type not having achieved remission: Secondary | ICD-10-CM | POA: Diagnosis not present

## 2017-12-16 DIAGNOSIS — E883 Tumor lysis syndrome: Secondary | ICD-10-CM

## 2017-12-16 DIAGNOSIS — R634 Abnormal weight loss: Secondary | ICD-10-CM

## 2017-12-16 DIAGNOSIS — I10 Essential (primary) hypertension: Secondary | ICD-10-CM | POA: Diagnosis not present

## 2017-12-16 DIAGNOSIS — E119 Type 2 diabetes mellitus without complications: Secondary | ICD-10-CM | POA: Diagnosis not present

## 2017-12-16 DIAGNOSIS — C4339 Malignant melanoma of other parts of face: Secondary | ICD-10-CM | POA: Diagnosis not present

## 2017-12-16 DIAGNOSIS — R599 Enlarged lymph nodes, unspecified: Secondary | ICD-10-CM | POA: Diagnosis not present

## 2017-12-16 DIAGNOSIS — R61 Generalized hyperhidrosis: Secondary | ICD-10-CM | POA: Diagnosis not present

## 2017-12-16 DIAGNOSIS — Z79899 Other long term (current) drug therapy: Secondary | ICD-10-CM

## 2017-12-16 DIAGNOSIS — Z87891 Personal history of nicotine dependence: Secondary | ICD-10-CM | POA: Diagnosis not present

## 2017-12-16 DIAGNOSIS — I4892 Unspecified atrial flutter: Secondary | ICD-10-CM | POA: Diagnosis not present

## 2017-12-16 DIAGNOSIS — Z9221 Personal history of antineoplastic chemotherapy: Secondary | ICD-10-CM

## 2017-12-16 DIAGNOSIS — Z950 Presence of cardiac pacemaker: Secondary | ICD-10-CM | POA: Diagnosis not present

## 2017-12-16 DIAGNOSIS — E875 Hyperkalemia: Secondary | ICD-10-CM

## 2017-12-16 LAB — CBC WITH DIFFERENTIAL/PLATELET
BASOS PCT: 0 %
Basophils Absolute: 0 10*3/uL (ref 0.0–0.1)
EOS ABS: 0 10*3/uL (ref 0.0–0.7)
Eosinophils Relative: 0 %
HCT: 31.9 % — ABNORMAL LOW (ref 39.0–52.0)
Hemoglobin: 9.2 g/dL — ABNORMAL LOW (ref 13.0–17.0)
LYMPHS ABS: 153.6 10*3/uL — AB (ref 0.7–4.0)
Lymphocytes Relative: 98 %
MCH: 32.2 pg (ref 26.0–34.0)
MCHC: 28.8 g/dL — ABNORMAL LOW (ref 30.0–36.0)
MCV: 111.5 fL — AB (ref 78.0–100.0)
MONO ABS: 1.6 10*3/uL — AB (ref 0.1–1.0)
Monocytes Relative: 1 %
NEUTROS ABS: 1.6 10*3/uL — AB (ref 1.7–7.7)
Neutrophils Relative %: 1 %
Platelets: 94 10*3/uL — ABNORMAL LOW (ref 150–400)
RBC: 2.86 MIL/uL — ABNORMAL LOW (ref 4.22–5.81)
RDW: 23.6 % — ABNORMAL HIGH (ref 11.5–15.5)
WBC: 156.8 10*3/uL (ref 4.0–10.5)

## 2017-12-16 LAB — COMPREHENSIVE METABOLIC PANEL
ALT: 12 U/L (ref 0–44)
AST: 17 U/L (ref 15–41)
Albumin: 3.2 g/dL — ABNORMAL LOW (ref 3.5–5.0)
Alkaline Phosphatase: 72 U/L (ref 38–126)
Anion gap: 6 (ref 5–15)
BUN: 45 mg/dL — ABNORMAL HIGH (ref 8–23)
CALCIUM: 8.5 mg/dL — AB (ref 8.9–10.3)
CHLORIDE: 105 mmol/L (ref 98–111)
CO2: 27 mmol/L (ref 22–32)
CREATININE: 1.82 mg/dL — AB (ref 0.61–1.24)
GFR, EST AFRICAN AMERICAN: 39 mL/min — AB (ref >60)
GFR, EST NON AFRICAN AMERICAN: 33 mL/min — AB (ref >60)
Glucose, Bld: 87 mg/dL (ref 70–99)
Potassium: 4.8 mmol/L (ref 3.5–5.1)
Sodium: 138 mmol/L (ref 135–145)
Total Bilirubin: 0.8 mg/dL (ref 0.3–1.2)
Total Protein: 7.1 g/dL (ref 6.5–8.1)

## 2017-12-16 LAB — MAGNESIUM: Magnesium: 1.9 mg/dL (ref 1.7–2.4)

## 2017-12-16 NOTE — Patient Instructions (Signed)
[facility] Discharge Instructions  Follow up in 1 week with labs.   Thank you for choosing [facility] to provide your oncology and hematology care.  To afford each patient quality time with our provider, please arrive at least 15 minutes before your scheduled appointment time.   If you have a lab appointment with the Cancer Center please come in thru the  Main Entrance and check in at the main information desk  You need to re-schedule your appointment should you arrive 10 or more minutes late.  We strive to give you quality time with our providers, and arriving late affects you and other patients whose appointments are after yours.  Also, if you no show three or more times for appointments you may be dismissed from the clinic at the providers discretion.     Again, thank you for choosing [facility].  Our hope is that these requests will decrease the amount of time that you wait before being seen by our physicians.       _____________________________________________________________  Should you have questions after your visit to [facility], please contact our office at [phone] between the hours of 8:00 a.m. and 4:30 p.m.  Voicemails left after 4:00 p.m. will not be returned until the following business day.  For prescription refill requests, have your pharmacy contact our office and allow 72 hours.    Cancer Center Support Programs:   > Cancer Support Group  2nd Tuesday of the month 1pm-2pm, Journey Room    

## 2017-12-16 NOTE — Progress Notes (Signed)
[place], [place] [address]   CLINIC:  Medical Oncology/Hematology  PCP:  [name], MD [address] [place] [address] [phone]   REASON FOR VISIT:  Follow-up for Chronic lymphocytic leukemia (CLL)  CURRENT THERAPY: Ibrutinib 280 mg and allopurinol 300 mg daily  BRIEF ONCOLOGIC HISTORY:    CLL (chronic lymphocytic leukemia) ([place])   02/14/2005 Procedure    Peripheral flow cytometry- CLL (without 11q or 17 p deletion) presenting with stage 0 disease      10/25/2008 Progression    Increasing WBC and symptoms suggestive of progressive disease      11/09/2008 - 01/07/2009 Chemotherapy    Rituxan, fludarabine, cytoxan x 3 cycles with the discontinuation of fludarabine and cytoxan due to severe pancytopenia requiring hospitalization.      01/10/2009 Adverse Reaction    Severe pancytopenia and adverse side effects      02/01/2009 - 04/01/2009 Chemotherapy    Rituxan every 21 days x 3 cycles      04/02/2009 Remission    Stable disease.  Chemotherapy holiday started      06/22/2013 - 06/29/2013 Chemotherapy    Imbruvica daily      06/29/2013 Adverse Reaction    Large right posterior leg (popliteal) ecchymosis/violaceous rash.  Likely Imbruvica-induced      07/31/2013 - 10/12/2013 Chemotherapy    Zydelig (idelalisib) 150 mg BID monotherapy      10/12/2013 Adverse Reaction    Transaminitis, suspected to be secondary to [place].  Dose decreased.      10/12/2013 - 11/09/2013 Chemotherapy    Zydelig (idelalisib) 150 mg daily.      11/09/2013 Adverse Reaction    Worsening transaminitis.  Zydelig on HOLD      11/09/2013 Treatment Plan Change    Hold Zydelig      12/28/2013 Adverse Reaction    Herpes Zoster outbreak.  Partial response with Famvir x 1 week, switched to acyclovir.  Progression over 1-2 weeks with development of keratoconjunctivitis bilaterally.  VZ Immune Globulin 625 mg IM given on 01/13/14.      02/08/2014  Treatment Plan Change    Ibrutinib restarted 140 mg daily for one week followed by 280 mg daily followed by 420 mg daily with prophylactic acyclovir.      02/08/2014 - 07/05/2014 Chemotherapy    Ibrutinib 420 mg daily      111/06/202015 - 04/17/2014 Hospital Admission    Right lower lobe pneumonia      07/05/2014 Adverse Reaction    Fissuring of fingertips and sore, red feet with transaminitis      07/05/2014 Treatment Plan Change    Hold Ibrutinib      07/22/2014 - 08/19/2014 Chemotherapy    Ibrutinib restarted at 280 mg daily.      08/09/2014 - 08/12/2014 Hospital Admission    Bilateral pneumonia      08/19/2014 Adverse Reaction    Nausea with vomiting.  Escorted to ED.      08/19/2014 Treatment Plan Change    Ibrutinib on hold- ED escort from clinic       10/18/2014 Miscellaneous    B12 weekly x 4 then monthly.      12/15/2015 Imaging    Bone density- BMD as determined from Femur Neck Left is 0.803 g/cm2 with a T-score of -2.1. This patient is considered osteopenic by World Healh Organization (WHO) Criteria.      12/16/2015 Imaging    CT abd/pelvis-  Progression of  retroperitoneal and portacaval adenopathy compared to the prior study. Likely related to CLL.  Small hiatal hernia  Mild compression fractures T11 and T12, probably old. Correlate with any pain in this area.      05/22/2016 - 05/28/2016 Chemotherapy    BR day 1 and 2 x 1 cycle.  Bendamustine dose reduced by 50%.      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      01/28/2017 -  Chemotherapy    Cycle 1 day 1 of Gayzva and Chlorambucil. Rasburicase 3 mg given also.       01/28/2017 - 02/08/2017 Hospital Admission    Admitted for sycopal episode due to complete heart block. Patient went into PEA arrest  requiring CPR and intubation. 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 Pacemaker placed while inpatient. Started on eliquis.        INTERVAL HISTORY:  Randall Reyes 82 y.o. male returns for routine follow-up chronic lymphocytic leukemia and consideration for next cycle of chemotherapy. Patient is here today with his wife. Patients energy level is still at 25%. Wife states she cant get him to do any other activities during the day. Patient appetite is great and he is eating 3 meals a day with snacks and drinking 3 Glucerna a day. He is maintain his weight well. Patient still has bilateral leg swelling. He will be seeing his Dermatologist this week to discuss when his surgery will be taking place. Advised to call so we can cordinate stopping his medication for a week prior due to increased risk of bleeding. He will be having surgery on the left side of his head. Patient denies fevers, chills, or night sweats. Denies any nausea, vomiting, or diarrhea.      REVIEW OF SYSTEMS:  Review of Systems  Constitutional: Positive for fatigue.  HENT:  Negative.   Eyes: Negative.   Respiratory: Negative.   Cardiovascular: Positive for leg swelling.  Gastrointestinal: Negative.   Endocrine: Negative.   Genitourinary: Negative.    Musculoskeletal: Negative.   Skin: Negative.   Neurological: Negative.   Hematological: Bruises/bleeds easily.  Psychiatric/Behavioral: Negative.      PAST MEDICAL/SURGICAL HISTORY:  Past Medical History:  Diagnosis Date  . Anemia   . B12 deficiency 12/07/2014  . CLL (chronic lymphocytic leukemia) ([place]) 01/24/2011  . Diabetes mellitus   . DM (diabetes mellitus) ([place]) 01/24/2011  . Eczema 01/24/2011  . History of pneumonia 04/2014  . Hypertension   . Leukemia ([place]) 6.22.2012   PER PATIENT  . Melanoma in situ ([place]) 01/24/2011  . Mild obesity 01/24/2011  . Port catheter in place 05/02/2012  . Shingles   . Ulcer    Past Surgical History:    Procedure Laterality Date  . bleeding ulcer    . CATARACT EXTRACTION W/PHACO  05/05/2012   Procedure: CATARACT EXTRACTION PHACO AND INTRAOCULAR LENS PLACEMENT (IOC);  Surgeon: Tonny Branch, MD;  Location: [facility];  Service: Ophthalmology;  Laterality: Right;  CDE:13.25  . CATARACT EXTRACTION W/PHACO  05/15/2012   Procedure: CATARACT EXTRACTION PHACO AND INTRAOCULAR LENS PLACEMENT (IOC);  Surgeon: Tonny Branch, MD;  Location: [facility];  Service: Ophthalmology;  Laterality: Left;  CDE:  12.32  . COLONOSCOPY N/A 01/30/2016   Procedure: COLONOSCOPY;  Surgeon: Danie Binder, MD;  Location: [facility] SU[facility];  Service: Endoscopy;  Laterality: N/A;  2:00 PM - moved to 1:00 - office notified pt  . ESOPHAGOGASTRODUODENOSCOPY  2008   Dr. Oneida Alar: normal esophagus, antral erythema, 1 cm clean based duodenal ulcera, negative H. plyori  . ESOPHAGOGASTRODUODENOSCOPY N/A 01/30/2016   Procedure: ESOPHAGOGASTRODUODENOSCOPY (EGD);  Surgeon: Danie Binder, MD;  Location: [facility];  Service: Endoscopy;  Laterality: N/A;  . HERNIA REPAIR  2001  . PACEMAKER IMPLANT N/A 02/07/2017   Procedure: PACEMAKER IMPLANT;  Surgeon: Constance Haw, MD;  Location: [facility];  Service: Cardiovascular;  Laterality: N/A;  . PORTACATH PLACEMENT  2008     SOCIAL HISTORY:  Social History   Socioeconomic History  . Marital status: Married    Spouse name: Not on file  . Number of children: Not on file  . Years of education: Not on file  . Highest education level: Not on file  Occupational History  . Not on file  Social Needs  . Financial resource strain: Not on file  . Food insecurity:    Worry: Not on file    Inability: Not on file  . Transportation needs:    Medical: Not on file    Non-medical: Not on file  Tobacco Use  . Smoking status: Former Smoker    Last attempt to quit: 09/11/1977    Years since quitting: 40.2  . Smokeless tobacco: Former Systems developer    Quit date: 04/25/1967  Substance and Sexual Activity  .  Alcohol use: No  . Drug use: No  . Sexual activity: Not on file  Lifestyle  . Physical activity:    Days per week: Not on file    Minutes per session: Not on file  . Stress: Not on file  Relationships  . Social connections:    Talks on phone: Not on file    Gets together: Not on file    Attends religious service: Not on file    Active member of club or organization: Not on file    Attends meetings of clubs or organizations: Not on file    Relationship status: Not on file  . Intimate partner violence:    Fear of current or ex partner: Not on file    Emotionally abused: Not on file    Physically abused: Not on file    Forced sexual activity: Not on file  Other Topics Concern  . Not on file  Social History Narrative  . Not on file    FAMILY HISTORY:  Family History  Problem Relation Age of Onset  . Diabetes Father   . Colon cancer Neg Hx     CURRENT MEDICATIONS:  Outpatient Encounter Medications as of 12/16/2017  Medication Sig  . ACCU-CHEK AVIVA PLUS test strip   . ACCU-CHEK SOFTCLIX LANCETS lancets   . allopurinol (ZYLOPRIM) 100 MG tablet Take 1 tablet (100 mg total) by mouth daily.  . cholecalciferol (VITAMIN D) 400 units TABS tablet Take 400 Units by mouth daily.  [name] docusate sodium (COLACE) 100 MG capsule Take 100 mg by mouth 2 (two) times daily.  [name] doxycycline (VIBRAMYCIN) 100 MG capsule Take 100 mg by mouth 2 (two) times daily.  . feeding supplement, ENSURE ENLIVE, (ENSURE ENLIVE) LIQD Take 237 mLs by mouth 2 (two) times daily between meals.  . ibrutinib (IMBRUVICA) 140 MG capsul Take 140 mg PO daily x 7 days and then increase to 280 mg PO daily thereafter (Patient taking differently: Take 140-280 mg by mouth See admin instructions. Take 140 mg  daily x 7 days and then increase to 280 mg daily thereafter starting on 11/22/2017)  . levETIRAcetam (KEPPRA) 100 MG/ML solution Take 7.5 mLs (750 mg total) by mouth 2 (two) times daily.  . magic mouthwash w/lidocaine SOLN Take 5  mLs by mouth 4 (four) times daily as needed for mouth pain.  . rosuvastatin (CRESTOR) 40 MG tablet Take 40 mg by mouth at bedtime.   . sevelamer carbonate (RENVELA) 800 MG tablet Take 2 tablets (1,600 mg total) by mouth 3 (three) times daily with meals. (Patient taking differently: Take 800 mg by mouth 2 (two) times daily with a meal. )  . sitaGLIPtin (JANUVIA) 100 MG tablet Take 100 mg by mouth daily.  . sodium bicarbonate 650 MG tablet TAKE 1 TABLET BY MOUTH TWICE A DAY AS DIRECTED  . sodium polystyrene (KAYEXALATE) powder   . vitamin C (ASCORBIC ACID) 500 MG tablet Take 500 mg by mouth daily.   Facility-Administered Encounter Medications as of 12/16/2017  Medication  . heparin lock flush 100 unit/mL  . sodium chloride flush (NS) 0.9 % injection 10 mL  . sodium polystyrene (KAYEXALATE) 15 GM/60ML suspension 30 g    ALLERGIES:  Allergies  Allergen Reactions  . Aspirin Other (See Comments)    Bleeding ulcers.   . Ibuprofen Other (See Comments)    Bleeding ulcers.     PHYSICAL EXAM:  ECOG Performance status: 2  Vitals:   12/16/17 1004  BP: 113/67  Pulse: 87  Resp: 16  Temp: 98.1 F (36.7 C)  SpO2: 100%   Filed Weights   12/16/17 1004  Weight: 116 lb 4.8 oz (52.8 kg)    Physical Exam  Constitutional: He is oriented to person, place, and time.  Cardiovascular: Normal rate, regular rhythm and normal heart sounds.  Pulmonary/Chest: Effort normal and breath sounds normal.  Neurological: He is alert and oriented to person, place, and time.  Skin: Skin is warm and dry.     LABORATORY DATA:  I have reviewed the labs as listed.  CBC    Component Value Date/Time   WBC 156.8 (HH) 12/16/2017 0823   RBC 2.86 (L) 12/16/2017 0823   HGB 9.2 (L) 12/16/2017 0823   HCT 31.9 (L) 12/16/2017 0823   PLT 94 (L) 12/16/2017 0823   MCV 111.5 (H) 12/16/2017 0823   MCH 32.2 12/16/2017 0823   MCHC 28.8 (L) 12/16/2017 0823   RDW 23.6 (H) 12/16/2017 0823   LYMPHSABS 153.6 (H) 12/16/2017  0823   MONOABS 1.6 (H) 12/16/2017 0823   EOSABS 0.0 12/16/2017 0823   BASOSABS 0.0 12/16/2017 0823   CMP Latest Ref Rng & Units 12/16/2017 12/10/2017 12/05/2017  Glucose 70 - 99 mg/dL 87 103(H) 128(H)  BUN 8 - 23 mg/dL 45(H) 26(H) 22  Creatinine 0.61 - 1.24 mg/dL 1.82(H) 1.75(H) 1.43(H)  Sodium 135 - 145 mmol/L 138 140 140  Potassium 3.5 - 5.1 mmol/L 4.8 4.9 4.5  Chloride 98 - 111 mmol/L 105 107 108  CO2 22 - 32 mmol/L 27 26 26   Calcium 8.9 - 10.3 mg/dL 8.5(L) 8.3(L) 8.0(L)  Total Protein 6.5 - 8.1 g/dL 7.1 7.1 6.5  Total Bilirubin 0.3 - 1.2 mg/dL 0.8 0.5 0.5  Alkaline Phos 38 - 126 U/L 72 83 92  AST 15 - 41 U/L 17 16 17   ALT 0 - 44 U/L 12 11 15        ASSESSMENT & PLAN:   CLL (chronic lymphocytic leukemia) ([facility]) 1. Stage IV CLL with history of poor tolerance  to past chemotherapies: -Could not tolerate past treatments including FCR in 2010, Imbruvica 2015 (located by large leg rash), Zydelig 9379 (complicated by transaminitis despite dose reduction), Imbruvica rechallenge in 0240 (complicated by infection and progressive fatigue), bendamustine/Rituxan 1 cycle in 9735 (complicated by prolonged and significant cytopenia in addition to tumor lysis syndrome and renal failure).  He could not even tolerate last treatment with obinutuzumab and chlorambucil. -He reports recent onset night sweats, tiredness on exertion and 15 pound weight loss in the last 1 month. -Ibrutinib 140 mg daily started on 11/22/2017 after TLS prophylaxis with rasburicase.  He was hospitalized for the next 2 days for observation with continuous IV fluids and lab monitoring.  He did not develop any tumor lysis syndrome.  He received 1 unit of blood transfusion yesterday prior to discharge. - Ibrutinib was increased to 2 tablets daily on 11/29/2017.  He has noticed easy bruising.  His tumor lysis syndrome labs were within normal limits. - He is continuing to get a good response with downtrending white cell count.  He received  1 unit of blood transfusion on 12/13/2017.  His hemoglobin today is 9.2.  Platelet count is continuing to improve and 94 today.  I will see him back in 1 week for follow-up.  2.  Left temporal melanoma: -This was reportedly biopsied by Dr. Nevada Crane few months ago and was thought to be melanoma.  Patient developed left preauricular adenopathy in the last few weeks.  Wife also reports that the temporal nodular lesion is increasing in the last 2 to 3 weeks.  He was seen by Dr. Nevada Crane who reportedly removed the temporal lesion on 12/02/2017.  He is seeing a dermatologist next week in [place].  If he is going to have more extensive surgery including lymph node dissection, we will have to hold ibrutinib 5 to 7 days prior to surgery.  3. TLS prevention: - He received rasburicase and was hospitalized for 2 days with continuous lab monitoring and IV fluids.  He did not develop any tumor lysis syndrome.  He is currently taking allopurinol daily.           Orders placed this encounter:  Orders Placed This Encounter  Procedures  . Lactate dehydrogenase  . CBC with Differential/Platelet  . Comprehensive metabolic panel      Derek Jack, MD [place] [phone]

## 2017-12-16 NOTE — Progress Notes (Signed)
CRITICAL VALUE ALERT Critical value received:  WBC 156.8 Date of notification:  12-16-17 Time of notification: 0910 Critical value read back:  Yes.   Nurse who received alert:  C.page RN MD notified (1st page): Dr. Delton Coombes

## 2017-12-16 NOTE — Assessment & Plan Note (Signed)
1. Stage IV CLL with history of poor tolerance to past chemotherapies: -Could not tolerate past treatments including FCR in 2010, Imbruvica 2015 (located by large leg rash), Zydelig 7793 (complicated by transaminitis despite dose reduction), Imbruvica rechallenge in 9030 (complicated by infection and progressive fatigue), bendamustine/Rituxan 1 cycle in 0923 (complicated by prolonged and significant cytopenia in addition to tumor lysis syndrome and renal failure).  He could not even tolerate last treatment with obinutuzumab and chlorambucil. -He reports recent onset night sweats, tiredness on exertion and 15 pound weight loss in the last 1 month. -Ibrutinib 140 mg daily started on 11/22/2017 after TLS prophylaxis with rasburicase.  He was hospitalized for the next 2 days for observation with continuous IV fluids and lab monitoring.  He did not develop any tumor lysis syndrome.  He received 1 unit of blood transfusion yesterday prior to discharge. - Ibrutinib was increased to 2 tablets daily on 11/29/2017.  He has noticed easy bruising.  His tumor lysis syndrome labs were within normal limits. - He is continuing to get a good response with downtrending white cell count.  He received 1 unit of blood transfusion on 12/13/2017.  His hemoglobin today is 9.2.  Platelet count is continuing to improve and 94 today.  I will see him back in 1 week for follow-up.  2.  Left temporal melanoma: -This was reportedly biopsied by Dr. Nevada Crane few months ago and was thought to be melanoma.  Patient developed left preauricular adenopathy in the last few weeks.  Wife also reports that the temporal nodular lesion is increasing in the last 2 to 3 weeks.  He was seen by Dr. Nevada Crane who reportedly removed the temporal lesion on 12/02/2017.  He is seeing a dermatologist next week in [place].  If he is going to have more extensive surgery including lymph node dissection, we will have to hold ibrutinib 5 to 7 days prior to surgery.  3. TLS  prevention: - He received rasburicase and was hospitalized for 2 days with continuous lab monitoring and IV fluids.  He did not develop any tumor lysis syndrome.  He is currently taking allopurinol daily.

## 2017-12-17 ENCOUNTER — Ambulatory Visit (HOSPITAL_COMMUNITY)
Admission: RE | Admit: 2017-12-17 | Discharge: 2017-12-17 | Disposition: A | Discharge status: Home / Self Care | Payer: Medicare Other | Source: Ambulatory Visit | Attending: Internal Medicine | Admitting: Internal Medicine

## 2017-12-17 DIAGNOSIS — R918 Other nonspecific abnormal finding of lung field: Secondary | ICD-10-CM | POA: Insufficient documentation

## 2017-12-17 DIAGNOSIS — J189 Pneumonia, unspecified organism: Secondary | ICD-10-CM | POA: Diagnosis not present

## 2017-12-17 DIAGNOSIS — J449 Chronic obstructive pulmonary disease, unspecified: Secondary | ICD-10-CM | POA: Insufficient documentation

## 2017-12-17 DIAGNOSIS — Z8701 Personal history of pneumonia (recurrent): Secondary | ICD-10-CM | POA: Insufficient documentation

## 2017-12-17 DIAGNOSIS — Z09 Encounter for follow-up examination after completed treatment for conditions other than malignant neoplasm: Secondary | ICD-10-CM | POA: Insufficient documentation

## 2017-12-19 ENCOUNTER — Other Ambulatory Visit: Payer: Self-pay | Admitting: Internal Medicine

## 2017-12-19 DIAGNOSIS — R911 Solitary pulmonary nodule: Secondary | ICD-10-CM

## 2017-12-20 ENCOUNTER — Other Ambulatory Visit (HOSPITAL_COMMUNITY): Payer: Medicare Other

## 2017-12-24 ENCOUNTER — Other Ambulatory Visit (HOSPITAL_COMMUNITY): Payer: Medicare Other

## 2017-12-24 ENCOUNTER — Ambulatory Visit (HOSPITAL_COMMUNITY): Payer: Medicare Other | Admitting: Hematology

## 2017-12-24 DIAGNOSIS — E875 Hyperkalemia: Secondary | ICD-10-CM | POA: Diagnosis not present

## 2017-12-24 DIAGNOSIS — E1129 Type 2 diabetes mellitus with other diabetic kidney complication: Secondary | ICD-10-CM | POA: Diagnosis not present

## 2017-12-24 DIAGNOSIS — N183 Chronic kidney disease, stage 3 (moderate): Secondary | ICD-10-CM | POA: Diagnosis not present

## 2017-12-24 DIAGNOSIS — D649 Anemia, unspecified: Secondary | ICD-10-CM | POA: Diagnosis not present

## 2017-12-24 DIAGNOSIS — R809 Proteinuria, unspecified: Secondary | ICD-10-CM | POA: Diagnosis not present

## 2017-12-25 ENCOUNTER — Inpatient Hospital Stay (HOSPITAL_COMMUNITY): Payer: Medicare Other

## 2017-12-25 ENCOUNTER — Inpatient Hospital Stay (HOSPITAL_BASED_OUTPATIENT_CLINIC_OR_DEPARTMENT_OTHER): Payer: Medicare Other | Admitting: Hematology

## 2017-12-25 ENCOUNTER — Other Ambulatory Visit: Payer: Self-pay

## 2017-12-25 ENCOUNTER — Encounter (HOSPITAL_COMMUNITY): Payer: Self-pay | Admitting: Hematology

## 2017-12-25 ENCOUNTER — Other Ambulatory Visit: Payer: Self-pay | Admitting: Internal Medicine

## 2017-12-25 DIAGNOSIS — I10 Essential (primary) hypertension: Secondary | ICD-10-CM | POA: Diagnosis not present

## 2017-12-25 DIAGNOSIS — R634 Abnormal weight loss: Secondary | ICD-10-CM

## 2017-12-25 DIAGNOSIS — Z79899 Other long term (current) drug therapy: Secondary | ICD-10-CM | POA: Diagnosis not present

## 2017-12-25 DIAGNOSIS — E119 Type 2 diabetes mellitus without complications: Secondary | ICD-10-CM | POA: Diagnosis not present

## 2017-12-25 DIAGNOSIS — Z950 Presence of cardiac pacemaker: Secondary | ICD-10-CM | POA: Diagnosis not present

## 2017-12-25 DIAGNOSIS — Z87891 Personal history of nicotine dependence: Secondary | ICD-10-CM | POA: Diagnosis not present

## 2017-12-25 DIAGNOSIS — Z9221 Personal history of antineoplastic chemotherapy: Secondary | ICD-10-CM

## 2017-12-25 DIAGNOSIS — R61 Generalized hyperhidrosis: Secondary | ICD-10-CM

## 2017-12-25 DIAGNOSIS — Z85820 Personal history of malignant melanoma of skin: Secondary | ICD-10-CM

## 2017-12-25 DIAGNOSIS — C4339 Malignant melanoma of other parts of face: Secondary | ICD-10-CM

## 2017-12-25 DIAGNOSIS — C9110 Chronic lymphocytic leukemia of B-cell type not having achieved remission: Secondary | ICD-10-CM

## 2017-12-25 DIAGNOSIS — R911 Solitary pulmonary nodule: Secondary | ICD-10-CM

## 2017-12-25 DIAGNOSIS — E875 Hyperkalemia: Secondary | ICD-10-CM

## 2017-12-25 DIAGNOSIS — E883 Tumor lysis syndrome: Secondary | ICD-10-CM

## 2017-12-25 DIAGNOSIS — R599 Enlarged lymph nodes, unspecified: Secondary | ICD-10-CM | POA: Diagnosis not present

## 2017-12-25 DIAGNOSIS — I4892 Unspecified atrial flutter: Secondary | ICD-10-CM | POA: Diagnosis not present

## 2017-12-25 LAB — COMPREHENSIVE METABOLIC PANEL
ALK PHOS: 63 U/L (ref 38–126)
ALT: 29 U/L (ref 0–44)
AST: 40 U/L (ref 15–41)
Albumin: 3.3 g/dL — ABNORMAL LOW (ref 3.5–5.0)
Anion gap: 7 (ref 5–15)
BUN: 40 mg/dL — AB (ref 8–23)
CALCIUM: 8.2 mg/dL — AB (ref 8.9–10.3)
CO2: 28 mmol/L (ref 22–32)
CREATININE: 1.63 mg/dL — AB (ref 0.61–1.24)
Chloride: 103 mmol/L (ref 98–111)
GFR calc non Af Amer: 38 mL/min — ABNORMAL LOW (ref >60)
GFR, EST AFRICAN AMERICAN: 44 mL/min — AB (ref >60)
GLUCOSE: 102 mg/dL — AB (ref 70–99)
Potassium: 4.7 mmol/L (ref 3.5–5.1)
SODIUM: 138 mmol/L (ref 135–145)
Total Bilirubin: 0.6 mg/dL (ref 0.3–1.2)
Total Protein: 7.1 g/dL (ref 6.5–8.1)

## 2017-12-25 LAB — LACTATE DEHYDROGENASE: LDH: 133 U/L (ref 98–192)

## 2017-12-25 LAB — CBC WITH DIFFERENTIAL/PLATELET
Basophils Absolute: 0 10*3/uL (ref 0.0–0.1)
Basophils Relative: 0 %
EOS PCT: 0 %
Eosinophils Absolute: 0 10*3/uL (ref 0.0–0.7)
HCT: 27 % — ABNORMAL LOW (ref 39.0–52.0)
HEMOGLOBIN: 8.4 g/dL — AB (ref 13.0–17.0)
LYMPHS ABS: 129.7 10*3/uL — AB (ref 0.7–4.0)
Lymphocytes Relative: 97 %
MCH: 34.6 pg — AB (ref 26.0–34.0)
MCHC: 31.1 g/dL (ref 30.0–36.0)
MCV: 111.1 fL — ABNORMAL HIGH (ref 78.0–100.0)
MONOS PCT: 1 %
Monocytes Absolute: 1.3 10*3/uL — ABNORMAL HIGH (ref 0.1–1.0)
NEUTROS ABS: 2.7 10*3/uL (ref 1.7–7.7)
Neutrophils Relative %: 2 %
Platelets: 116 10*3/uL — ABNORMAL LOW (ref 150–400)
RBC: 2.43 MIL/uL — ABNORMAL LOW (ref 4.22–5.81)
RDW: 22.4 % — ABNORMAL HIGH (ref 11.5–15.5)
WBC: 133.7 10*3/uL (ref 4.0–10.5)

## 2017-12-25 NOTE — Progress Notes (Signed)
CRITICAL VALUE ALERT  Critical Value:  WBC 133.7  Date & Time Notied:  12/25/17 at 1515  Provider Notified: Dr. Delton Coombes  Orders Received/Actions taken: n/a

## 2017-12-25 NOTE — Progress Notes (Signed)
[facility] [place], [place] [id number]   CLINIC:  Medical Oncology/Hematology  PCP:  [name], Randall Reyes [place] [address] [phone]   REASON FOR VISIT:  Follow-up for CLL  CURRENT THERAPY: Ibrutinib 280 mg and allopurinol 300 mg daily  BRIEF ONCOLOGIC HISTORY:    CLL (chronic lymphocytic leukemia) ([place])   02/14/2005 Procedure    Peripheral flow cytometry- CLL (without 11q or 17 p deletion) presenting with stage 0 disease    10/25/2008 Progression    Increasing WBC and symptoms suggestive of progressive disease    11/09/2008 - 01/07/2009 Chemotherapy    Rituxan, fludarabine, cytoxan x 3 cycles with the discontinuation of fludarabine and cytoxan due to severe pancytopenia requiring hospitalization.    01/10/2009 Adverse Reaction    Severe pancytopenia and adverse side effects    02/01/2009 - 04/01/2009 Chemotherapy    Rituxan every 21 days x 3 cycles    04/02/2009 Remission    Stable disease.  Chemotherapy holiday started    06/22/2013 - 06/29/2013 Chemotherapy    Imbruvica daily    06/29/2013 Adverse Reaction    Large right posterior leg (popliteal) ecchymosis/violaceous rash.  Likely Imbruvica-induced    07/31/2013 - 10/12/2013 Chemotherapy    Zydelig (idelalisib) 150 mg BID monotherapy    10/12/2013 Adverse Reaction    Transaminitis, suspected to be secondary to [place].  Dose decreased.    10/12/2013 - 11/09/2013 Chemotherapy    Zydelig (idelalisib) 150 mg daily.    11/09/2013 Adverse Reaction    Worsening transaminitis.  Zydelig on HOLD    11/09/2013 Treatment Plan Change    Hold Zydelig    12/28/2013 Adverse Reaction    Herpes Zoster outbreak.  Partial response with Famvir x 1 week, switched to acyclovir.  Progression over 1-2 weeks with development of keratoconjunctivitis bilaterally.  VZ Immune Globulin 625 mg IM given on 01/13/14.    02/08/2014 Treatment Plan Change    Ibrutinib restarted 140 mg daily for one week  followed by 280 mg daily followed by 420 mg daily with prophylactic acyclovir.    02/08/2014 - 07/05/2014 Chemotherapy    Ibrutinib 420 mg daily    1October 10, 202015 - 04/17/2014 Hospital Admission    Right lower lobe pneumonia    07/05/2014 Adverse Reaction    Fissuring of fingertips and sore, red feet with transaminitis    07/05/2014 Treatment Plan Change    Hold Ibrutinib    07/22/2014 - 08/19/2014 Chemotherapy    Ibrutinib restarted at 280 mg daily.    08/09/2014 - 08/12/2014 Hospital Admission    Bilateral pneumonia    08/19/2014 Adverse Reaction    Nausea with vomiting.  Escorted to ED.    08/19/2014 Treatment Plan Change    Ibrutinib on hold- ED escort from clinic     10/18/2014 Miscellaneous    B12 weekly x 4 then monthly.    12/15/2015 Imaging    Bone density- BMD as determined from Femur Neck Left is 0.803 g/cm2 with a T-score of -2.1. This patient is considered osteopenic by World Healh Organization (WHO) Criteria.    12/16/2015 Imaging    CT abd/pelvis-  Progression of retroperitoneal and portacaval adenopathy compared to the prior study. Likely related to CLL.  Small hiatal hernia  Mild compression fractures T11 and T12, probably old. Correlate with any pain in this area.    05/22/2016 - 05/28/2016 Chemotherapy    BR day 1 and 2 x 1 cycle.  Bendamustine  dose reduced by 50%.    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    01/28/2017 -  Chemotherapy    Cycle 1 day 1 of Gayzva and Chlorambucil. Rasburicase 3 mg given also.     01/28/2017 - 02/08/2017 Hospital Admission    Admitted for sycopal episode due to complete heart block. Patient went into PEA arrest requiring CPR and intubation. 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 Pacemaker placed  while inpatient. Started on eliquis.      INTERVAL HISTORY:  Randall Reyes 82 y.o. male returns for routine follow-up for CLL. Patient is here today with his wife. Patient is feeling better. He has a little more energy. He is still at 50% but improved since last visit. He follows up with his Dermatologist next week and will be having surgery to remove a skin lump on the left forehead. He is having nausea occasionally throughout the day. He is trying supplement with boost. He denies any bleeding or blood in his stool. Denies any new lumps or pain. Denies anu vomiting or diarrhea. Patient lives at home with his wife and she helps performs all his ADLs.     REVIEW OF SYSTEMS:  Review of Systems  Gastrointestinal: Positive for nausea.  Hematological: Bruises/bleeds easily.  All other systems reviewed and are negative.    PAST MEDICAL/SURGICAL HISTORY:  Past Medical History:  Diagnosis Date  . Anemia   . B12 deficiency 12/07/2014  . CLL (chronic lymphocytic leukemia) ([facility]) 01/24/2011  . Diabetes mellitus   . DM (diabetes mellitus) ([facility]) 01/24/2011  . Eczema 01/24/2011  . History of pneumonia 04/2014  . Hypertension   . Leukemia ([facility]) 6.22.2012   PER PATIENT  . Melanoma in situ ([facility]) 01/24/2011  . Mild obesity 01/24/2011  . Port catheter in place 05/02/2012  . Shingles   . Ulcer    Past Surgical History:  Procedure Laterality Date  . bleeding ulcer    . CATARACT EXTRACTION W/PHACO  05/05/2012   Procedure: CATARACT EXTRACTION PHACO AND INTRAOCULAR LENS PLACEMENT (IOC);  Surgeon: Tonny Branch, Randall Reyes;  Location: [facility];  Service: Ophthalmology;  Laterality: Right;  CDE:13.25  . CATARACT EXTRACTION W/PHACO  05/15/2012   Procedure: CATARACT EXTRACTION PHACO AND INTRAOCULAR LENS PLACEMENT (IOC);  Surgeon: Tonny Branch, Randall Reyes;  Location: [facility];  Service: Ophthalmology;  Laterality: Left;  CDE:  12.32  . COLONOSCOPY N/A 01/30/2016   Procedure: COLONOSCOPY;  Surgeon: Danie Binder, Randall Reyes;  Location: [facility]  ENDO SUITE;  Service: Endoscopy;  Laterality: N/A;  2:00 PM - moved to 1:00 - office notified pt  . ESOPHAGOGASTRODUODENOSCOPY  2008   Dr. Oneida Alar: normal esophagus, antral erythema, 1 cm clean based duodenal ulcera, negative H. plyori  . ESOPHAGOGASTRODUODENOSCOPY N/A 01/30/2016   Procedure: ESOPHAGOGASTRODUODENOSCOPY (EGD);  Surgeon: Danie Binder, Randall Reyes;  Location: [facility];  Service: Endoscopy;  Laterality: N/A;  . HERNIA REPAIR  2001  . PACEMAKER IMPLANT N/A 02/07/2017   Procedure: PACEMAKER IMPLANT;  Surgeon: Constance Haw, Randall Reyes;  Location: [facility];  Service: Cardiovascular;  Laterality: N/A;  . PORTACATH PLACEMENT  2008     SOCIAL HISTORY:  Social History   Socioeconomic History  . Marital status: Married    Spouse name: Not on file  . Number of children: Not on file  .  Years of education: Not on file  . Highest education level: Not on file  Occupational History  . Not on file  Social Needs  . Financial resource strain: Not on file  . Food insecurity:    Worry: Not on file    Inability: Not on file  . Transportation needs:    Medical: Not on file    Non-medical: Not on file  Tobacco Use  . Smoking status: Former Smoker    Last attempt to quit: 09/11/1977    Years since quitting: 40.3  . Smokeless tobacco: Former Systems developer    Quit date: 04/25/1967  Substance and Sexual Activity  . Alcohol use: No  . Drug use: No  . Sexual activity: Not on file  Lifestyle  . Physical activity:    Days per week: Not on file    Minutes per session: Not on file  . Stress: Not on file  Relationships  . Social connections:    Talks on phone: Not on file    Gets together: Not on file    Attends religious service: Not on file    Active member of club or organization: Not on file    Attends meetings of clubs or organizations: Not on file    Relationship status: Not on file  . Intimate partner violence:    Fear of current or ex partner: Not on file    Emotionally abused:  Not on file    Physically abused: Not on file    Forced sexual activity: Not on file  Other Topics Concern  . Not on file  Social History Narrative  . Not on file    FAMILY HISTORY:  Family History  Problem Relation Age of Onset  . Diabetes Father   . Colon cancer Neg Hx     CURRENT MEDICATIONS:  Outpatient Encounter Medications as of 12/25/2017  Medication Sig  . ACCU-CHEK AVIVA PLUS test strip   . ACCU-CHEK SOFTCLIX LANCETS lancets   . allopurinol (ZYLOPRIM) 100 MG tablet Take 1 tablet (100 mg total) by mouth daily.  . cholecalciferol (VITAMIN D) 400 units TABS tablet Take 400 Units by mouth daily.  [name] docusate sodium (COLACE) 100 MG capsule Take 100 mg by mouth 2 (two) times daily.  . feeding supplement, ENSURE ENLIVE, (ENSURE ENLIVE) LIQD Take 237 mLs by mouth 2 (two) times daily between meals.  . ibrutinib (IMBRUVICA) 140 MG capsul Take 140 mg PO daily x 7 days and then increase to 280 mg PO daily thereafter (Patient taking differently: Take 140-280 mg by mouth See admin instructions. Take 140 mg  daily x 7 days and then increase to 280 mg daily thereafter starting on 11/22/2017)  . levETIRAcetam (KEPPRA) 100 MG/ML solution Take 7.5 mLs (750 mg total) by mouth 2 (two) times daily.  . magic mouthwash w/lidocaine SOLN Take 5 mLs by mouth 4 (four) times daily as needed for mouth pain.  . rosuvastatin (CRESTOR) 40 MG tablet Take 40 mg by mouth at bedtime.   . sitaGLIPtin (JANUVIA) 100 MG tablet Take 100 mg by mouth daily.  . sodium bicarbonate 650 MG tablet TAKE 1 TABLET BY MOUTH TWICE A DAY AS DIRECTED  . sodium polystyrene (KAYEXALATE) powder   . vitamin C (ASCORBIC ACID) 500 MG tablet Take 500 mg by mouth daily.  . [DISCONTINUED] doxycycline (VIBRAMYCIN) 100 MG capsule Take 100 mg by mouth 2 (two) times daily.  . [DISCONTINUED] sevelamer carbonate (RENVELA) 800 MG tablet Take 2 tablets (1,600 mg total) by mouth  3 (three) times daily with meals. (Patient taking differently: Take 800  mg by mouth 2 (two) times daily with a meal. )   Facility-Administered Encounter Medications as of 12/25/2017  Medication  . heparin lock flush 100 unit/mL  . sodium chloride flush (NS) 0.9 % injection 10 mL  . sodium polystyrene (KAYEXALATE) 15 GM/60ML suspension 30 g    ALLERGIES:  Allergies  Allergen Reactions  . Aspirin Other (See Comments)    Bleeding ulcers.   . Ibuprofen Other (See Comments)    Bleeding ulcers.     PHYSICAL EXAM:  ECOG Performance status: 1  Vitals:   12/25/17 1502  BP: (!) 98/58  Pulse: 84  Resp: 16  Temp: 98.3 F (36.8 C)  SpO2: 98%   Filed Weights   12/25/17 1502  Weight: 120 lb 6.4 oz (54.6 kg)    Physical Exam  Constitutional: He is oriented to person, place, and time.  Cardiovascular: Normal rate, regular rhythm and normal heart sounds.  Pulmonary/Chest: Effort normal and breath sounds normal.  Neurological: He is alert and oriented to person, place, and time.  Skin: Skin is warm and dry.     LABORATORY DATA:  I have reviewed the labs as listed.  CBC    Component Value Date/Time   WBC 133.7 (HH) 12/25/2017 1402   RBC 2.43 (L) 12/25/2017 1402   HGB 8.4 (L) 12/25/2017 1402   HCT 27.0 (L) 12/25/2017 1402   PLT 116 (L) 12/25/2017 1402   MCV 111.1 (H) 12/25/2017 1402   MCH 34.6 (H) 12/25/2017 1402   MCHC 31.1 12/25/2017 1402   RDW 22.4 (H) 12/25/2017 1402   LYMPHSABS 129.7 (H) 12/25/2017 1402   MONOABS 1.3 (H) 12/25/2017 1402   EOSABS 0.0 12/25/2017 1402   BASOSABS 0.0 12/25/2017 1402   CMP Latest Ref Rng & Units 12/25/2017 12/16/2017 12/10/2017  Glucose 70 - 99 mg/dL 102(H) 87 103(H)  BUN 8 - 23 mg/dL 40(H) 45(H) 26(H)  Creatinine 0.61 - 1.24 mg/dL 1.63(H) 1.82(H) 1.75(H)  Sodium 135 - 145 mmol/L 138 138 140  Potassium 3.5 - 5.1 mmol/L 4.7 4.8 4.9  Chloride 98 - 111 mmol/L 103 105 107  CO2 22 - 32 mmol/L 28 27 26   Calcium 8.9 - 10.3 mg/dL 8.2(L) 8.5(L) 8.3(L)  Total Protein 6.5 - 8.1 g/dL 7.1 7.1 7.1  Total Bilirubin 0.3 -  1.2 mg/dL 0.6 0.8 0.5  Alkaline Phos 38 - 126 U/L 63 72 83  AST 15 - 41 U/L 40 17 16  ALT 0 - 44 U/L 29 12 11         ASSESSMENT & PLAN:   CLL (chronic lymphocytic leukemia) (H[facility]) 1. Stage IV CLL with history of poor tolerance to past chemotherapies: -Could not tolerate past treatments including FCR in 2010, Imbruvica 2015 (located by large leg rash), Zydelig 4332 (complicated by transaminitis despite dose reduction), Imbruvica rechallenge in 9518 (complicated by infection and progressive fatigue), bendamustine/Rituxan 1 cycle in 8416 (complicated by prolonged and significant cytopenia in addition to tumor lysis syndrome and renal failure).  He could not even tolerate last treatment with obinutuzumab and chlorambucil. -He reports recent onset night sweats, tiredness on exertion and 15 pound weight loss in the last 1 month. -Ibrutinib 140 mg daily started on 11/22/2017 after TLS prophylaxis with rasburicase.  He was hospitalized for the next 2 days for observation with continuous IV fluids and lab monitoring.  He did not develop any tumor lysis syndrome.  He received 1 unit of blood transfusion  yesterday prior to discharge. - Ibrutinib was increased to 2 tablets daily on 11/29/2017.  He has noticed easy bruising.  His tumor lysis syndrome labs were within normal limits. - He is continuing to get a good response with downtrending white cell count.  He received 1 unit of blood transfusion on 12/13/2017. - His white count is continuing to come down.  His platelet count is continuing to improve.  His hemoglobin is 8.4 today. -He is reportedly having surgery for his melanoma next Wednesday.  If that is confirmed, he will stop taking ibrutinib today.  I will see him back next Friday to review his labs and restart his Ibrutinib.  2.  Left temporal melanoma: -This was reportedly biopsied by Dr. Nevada Crane few months ago and was thought to be melanoma.  Patient developed left preauricular adenopathy in the last  few weeks.  Wife also reports that the temporal nodular lesion is increasing in the last 2 to 3 weeks.  He was seen by Dr. Nevada Crane who reportedly removed the temporal lesion on 12/02/2017.  He is reportedly having surgical resection of his left temporal melanoma along with lymph node removal next Wednesday.  He is holding ibrutinib.  3. TLS prevention: - He received rasburicase and was hospitalized for 2 days with continuous lab monitoring and IV fluids.  He did not develop any tumor lysis syndrome.  He is currently taking allopurinol daily.           Orders placed this encounter:  Orders Placed This Encounter  Procedures  . CBC with Differential/Platelet  . Comprehensive metabolic panel  . Lactate dehydrogenase      Randall Jack, Randall Reyes [place] [phone]

## 2017-12-25 NOTE — Assessment & Plan Note (Signed)
1. Stage IV CLL with history of poor tolerance to past chemotherapies: -Could not tolerate past treatments including FCR in 2010, Imbruvica 2015 (located by large leg rash), Zydelig 2831 (complicated by transaminitis despite dose reduction), Imbruvica rechallenge in 5176 (complicated by infection and progressive fatigue), bendamustine/Rituxan 1 cycle in 1607 (complicated by prolonged and significant cytopenia in addition to tumor lysis syndrome and renal failure).  He could not even tolerate last treatment with obinutuzumab and chlorambucil. -He reports recent onset night sweats, tiredness on exertion and 15 pound weight loss in the last 1 month. -Ibrutinib 140 mg daily started on 11/22/2017 after TLS prophylaxis with rasburicase.  He was hospitalized for the next 2 days for observation with continuous IV fluids and lab monitoring.  He did not develop any tumor lysis syndrome.  He received 1 unit of blood transfusion yesterday prior to discharge. - Ibrutinib was increased to 2 tablets daily on 11/29/2017.  He has noticed easy bruising.  His tumor lysis syndrome labs were within normal limits. - He is continuing to get a good response with downtrending white cell count.  He received 1 unit of blood transfusion on 12/13/2017. - His white count is continuing to come down.  His platelet count is continuing to improve.  His hemoglobin is 8.4 today. -He is reportedly having surgery for his melanoma next Wednesday.  If that is confirmed, he will stop taking ibrutinib today.  I will see him back next Friday to review his labs and restart his Ibrutinib.  2.  Left temporal melanoma: -This was reportedly biopsied by Dr. Nevada Crane few months ago and was thought to be melanoma.  Patient developed left preauricular adenopathy in the last few weeks.  Wife also reports that the temporal nodular lesion is increasing in the last 2 to 3 weeks.  He was seen by Dr. Nevada Crane who reportedly removed the temporal lesion on 12/02/2017.  He is  reportedly having surgical resection of his left temporal melanoma along with lymph node removal next Wednesday.  He is holding ibrutinib.  3. TLS prevention: - He received rasburicase and was hospitalized for 2 days with continuous lab monitoring and IV fluids.  He did not develop any tumor lysis syndrome.  He is currently taking allopurinol daily.

## 2017-12-25 NOTE — Patient Instructions (Addendum)
[place] at [facility] Discharge Instructions  Follow up with [place] in a week and a half with labs.    Thank you for choosing [place] at [name] to provide your oncology and hematology care.  To afford each patient quality time with our provider, please arrive at least 15 minutes before your scheduled appointment time.   If you have a lab appointment with the [facility] please come in thru the  Main Entrance and check in at the main information desk  You need to re-schedule your appointment should you arrive 10 or more minutes late.  We strive to give you quality time with our providers, and arriving late affects you and other patients whose appointments are after yours.  Also, if you no show three or more times for appointments you may be dismissed from the clinic at the providers discretion.     Again, thank you for choosing [facility].  Our hope is that these requests will decrease the amount of time that you wait before being seen by our physicians.       _____________________________________________________________  Should you have questions after your visit to [facility], please contact our office at [phone] between the hours of 8:00 a.m. and 4:30 p.m.  Voicemails left after 4:00 p.m. will not be returned until the following business day.  For prescription refill requests, have your pharmacy contact our office and allow 72 hours.    Cancer Center Support Programs:   > Cancer Support Group  2nd Tuesday of the month 1pm-2pm, Journey Room

## 2017-12-27 ENCOUNTER — Inpatient Hospital Stay (HOSPITAL_COMMUNITY): Payer: Medicare Other

## 2017-12-27 ENCOUNTER — Other Ambulatory Visit (HOSPITAL_COMMUNITY): Payer: Medicare Other

## 2017-12-27 ENCOUNTER — Inpatient Hospital Stay (HOSPITAL_COMMUNITY): Payer: Medicare Other | Attending: Hematology

## 2017-12-27 VITALS — BP 101/57 | HR 82 | Temp 98.0°F | Resp 16

## 2017-12-27 DIAGNOSIS — E883 Tumor lysis syndrome: Secondary | ICD-10-CM | POA: Diagnosis not present

## 2017-12-27 DIAGNOSIS — Z85820 Personal history of malignant melanoma of skin: Secondary | ICD-10-CM | POA: Diagnosis not present

## 2017-12-27 DIAGNOSIS — E875 Hyperkalemia: Secondary | ICD-10-CM | POA: Diagnosis not present

## 2017-12-27 DIAGNOSIS — E119 Type 2 diabetes mellitus without complications: Secondary | ICD-10-CM | POA: Diagnosis not present

## 2017-12-27 DIAGNOSIS — Z950 Presence of cardiac pacemaker: Secondary | ICD-10-CM | POA: Diagnosis not present

## 2017-12-27 DIAGNOSIS — R61 Generalized hyperhidrosis: Secondary | ICD-10-CM | POA: Diagnosis not present

## 2017-12-27 DIAGNOSIS — I10 Essential (primary) hypertension: Secondary | ICD-10-CM | POA: Diagnosis not present

## 2017-12-27 DIAGNOSIS — Z87891 Personal history of nicotine dependence: Secondary | ICD-10-CM | POA: Diagnosis not present

## 2017-12-27 DIAGNOSIS — C9110 Chronic lymphocytic leukemia of B-cell type not having achieved remission: Secondary | ICD-10-CM | POA: Diagnosis not present

## 2017-12-27 DIAGNOSIS — I4892 Unspecified atrial flutter: Secondary | ICD-10-CM | POA: Diagnosis not present

## 2017-12-27 DIAGNOSIS — C4339 Malignant melanoma of other parts of face: Secondary | ICD-10-CM | POA: Diagnosis not present

## 2017-12-27 DIAGNOSIS — Z79899 Other long term (current) drug therapy: Secondary | ICD-10-CM | POA: Diagnosis not present

## 2017-12-27 DIAGNOSIS — Z9221 Personal history of antineoplastic chemotherapy: Secondary | ICD-10-CM | POA: Diagnosis not present

## 2017-12-27 DIAGNOSIS — R599 Enlarged lymph nodes, unspecified: Secondary | ICD-10-CM | POA: Diagnosis not present

## 2017-12-27 DIAGNOSIS — E538 Deficiency of other specified B group vitamins: Secondary | ICD-10-CM

## 2017-12-27 MED ORDER — CYANOCOBALAMIN 1000 MCG/ML IJ SOLN
1000.0000 ug | Freq: Once | INTRAMUSCULAR | Status: AC
  Administered 2017-12-27: 1000 ug via INTRAMUSCULAR

## 2017-12-27 MED ORDER — CYANOCOBALAMIN 1000 MCG/ML IJ SOLN
INTRAMUSCULAR | Status: AC
  Filled 2017-12-27: qty 1

## 2017-12-27 NOTE — Progress Notes (Signed)
Nutrition Follow-up:  Patient with stage IV CLL and left temporal melanoma.  Noted surgery next week for melanoma.   Met with patient and wife following B 12 injection.  Wife reports that patient is eating better.  Reports that he is drinking 2-3 ensure/glucerna (diabetic) shakes daily.  Reports that he ate egg, sausage, gravy, light bread and coffee for breakfast yesterday and then had butter beans and squash for both lunch and dinner.  Reports that he is drinking shakes between meals.  Wife reports his portions have increased.  Wife reports that he has washed dishes recently and swept off the porch which he has not done in a long time.    Medications: reviewed  Labs: reviewed  Anthropometrics:   Weight increased to 120 lb 6.4 oz from 117 lb on 8/2   NUTRITION DIAGNOSIS: Unintentional weight loss improving   INTERVENTION:  Congratulated patient on weight gain.   Encouraged him to continue eating high calorie, high protein foods Encouraged intake of oral nutrition supplement shakes.  Wife wants to pick up another case (2nd) of ensure enlive on Friday, August 23rd.      MONITORING, EVALUATION, GOAL: weight trends, intake   NEXT VISIT: Sept 13  Jamilynn Whitacre B. Zenia Resides, [place], [place] Registered Dietitian [phone] (pager)

## 2017-12-27 NOTE — Progress Notes (Signed)
Randall Reyes presents today for injection per MD orders. B12 1,000 mcg administered SQ in left Upper Arm. Administration without incident. Patient tolerated well.   Vitals stable and discharged home from clinic ambulatory. Follow up as scheduled.

## 2017-12-31 ENCOUNTER — Ambulatory Visit (HOSPITAL_COMMUNITY)
Admission: RE | Admit: 2017-12-31 | Discharge: 2017-12-31 | Disposition: A | Discharge status: Home / Self Care | Payer: Medicare Other | Source: Ambulatory Visit | Attending: Internal Medicine | Admitting: Internal Medicine

## 2017-12-31 DIAGNOSIS — J90 Pleural effusion, not elsewhere classified: Secondary | ICD-10-CM | POA: Diagnosis not present

## 2017-12-31 DIAGNOSIS — R590 Localized enlarged lymph nodes: Secondary | ICD-10-CM | POA: Insufficient documentation

## 2017-12-31 DIAGNOSIS — I700 Atherosclerosis of aorta: Secondary | ICD-10-CM | POA: Insufficient documentation

## 2017-12-31 DIAGNOSIS — R911 Solitary pulmonary nodule: Secondary | ICD-10-CM | POA: Insufficient documentation

## 2017-12-31 DIAGNOSIS — R918 Other nonspecific abnormal finding of lung field: Secondary | ICD-10-CM | POA: Insufficient documentation

## 2018-01-01 DIAGNOSIS — C44329 Squamous cell carcinoma of skin of other parts of face: Secondary | ICD-10-CM | POA: Diagnosis not present

## 2018-01-02 ENCOUNTER — Ambulatory Visit (HOSPITAL_COMMUNITY): Payer: Medicare Other

## 2018-01-03 ENCOUNTER — Encounter (HOSPITAL_COMMUNITY): Payer: Self-pay | Admitting: Hematology

## 2018-01-03 ENCOUNTER — Other Ambulatory Visit: Payer: Self-pay

## 2018-01-03 ENCOUNTER — Inpatient Hospital Stay (HOSPITAL_COMMUNITY): Payer: Medicare Other

## 2018-01-03 ENCOUNTER — Inpatient Hospital Stay (HOSPITAL_BASED_OUTPATIENT_CLINIC_OR_DEPARTMENT_OTHER): Payer: Medicare Other | Admitting: Hematology

## 2018-01-03 VITALS — BP 124/68 | HR 89 | Resp 16 | Wt 118.5 lb

## 2018-01-03 DIAGNOSIS — R61 Generalized hyperhidrosis: Secondary | ICD-10-CM | POA: Diagnosis not present

## 2018-01-03 DIAGNOSIS — E119 Type 2 diabetes mellitus without complications: Secondary | ICD-10-CM | POA: Diagnosis not present

## 2018-01-03 DIAGNOSIS — I4892 Unspecified atrial flutter: Secondary | ICD-10-CM | POA: Diagnosis not present

## 2018-01-03 DIAGNOSIS — Z9221 Personal history of antineoplastic chemotherapy: Secondary | ICD-10-CM

## 2018-01-03 DIAGNOSIS — E883 Tumor lysis syndrome: Secondary | ICD-10-CM | POA: Diagnosis not present

## 2018-01-03 DIAGNOSIS — E875 Hyperkalemia: Secondary | ICD-10-CM | POA: Diagnosis not present

## 2018-01-03 DIAGNOSIS — C9110 Chronic lymphocytic leukemia of B-cell type not having achieved remission: Secondary | ICD-10-CM

## 2018-01-03 DIAGNOSIS — Z85820 Personal history of malignant melanoma of skin: Secondary | ICD-10-CM | POA: Diagnosis not present

## 2018-01-03 DIAGNOSIS — I10 Essential (primary) hypertension: Secondary | ICD-10-CM | POA: Diagnosis not present

## 2018-01-03 DIAGNOSIS — C4339 Malignant melanoma of other parts of face: Secondary | ICD-10-CM

## 2018-01-03 DIAGNOSIS — Z950 Presence of cardiac pacemaker: Secondary | ICD-10-CM | POA: Diagnosis not present

## 2018-01-03 DIAGNOSIS — R634 Abnormal weight loss: Secondary | ICD-10-CM

## 2018-01-03 DIAGNOSIS — Z79899 Other long term (current) drug therapy: Secondary | ICD-10-CM

## 2018-01-03 DIAGNOSIS — R599 Enlarged lymph nodes, unspecified: Secondary | ICD-10-CM

## 2018-01-03 DIAGNOSIS — Z87891 Personal history of nicotine dependence: Secondary | ICD-10-CM | POA: Diagnosis not present

## 2018-01-03 LAB — COMPREHENSIVE METABOLIC PANEL
ALBUMIN: 3.3 g/dL — AB (ref 3.5–5.0)
ALK PHOS: 189 U/L — AB (ref 38–126)
ALT: 65 U/L — AB (ref 0–44)
AST: 35 U/L (ref 15–41)
Anion gap: 8 (ref 5–15)
BILIRUBIN TOTAL: 0.4 mg/dL (ref 0.3–1.2)
BUN: 27 mg/dL — AB (ref 8–23)
CALCIUM: 8.3 mg/dL — AB (ref 8.9–10.3)
CO2: 25 mmol/L (ref 22–32)
CREATININE: 1.82 mg/dL — AB (ref 0.61–1.24)
Chloride: 107 mmol/L (ref 98–111)
GFR calc Af Amer: 39 mL/min — ABNORMAL LOW (ref >60)
GFR calc non Af Amer: 33 mL/min — ABNORMAL LOW (ref >60)
Glucose, Bld: 196 mg/dL — ABNORMAL HIGH (ref 70–99)
Potassium: 4.4 mmol/L (ref 3.5–5.1)
SODIUM: 140 mmol/L (ref 135–145)
TOTAL PROTEIN: 7.3 g/dL (ref 6.5–8.1)

## 2018-01-03 LAB — CBC WITH DIFFERENTIAL/PLATELET
BAND NEUTROPHILS: 0 %
BASOS ABS: 0 10*3/uL (ref 0.0–0.1)
BLASTS: 0 %
Basophils Relative: 0 %
EOS ABS: 0 10*3/uL (ref 0.0–0.7)
Eosinophils Relative: 0 %
HEMATOCRIT: 29.3 % — AB (ref 39.0–52.0)
HEMOGLOBIN: 9.1 g/dL — AB (ref 13.0–17.0)
LYMPHS PCT: 95 %
Lymphs Abs: 60.2 10*3/uL — ABNORMAL HIGH (ref 0.7–4.0)
MCH: 34.9 pg — ABNORMAL HIGH (ref 26.0–34.0)
MCHC: 31.1 g/dL (ref 30.0–36.0)
MCV: 112.3 fL — ABNORMAL HIGH (ref 78.0–100.0)
METAMYELOCYTES PCT: 0 %
MONOS PCT: 0 %
Monocytes Absolute: 0 10*3/uL — ABNORMAL LOW (ref 0.1–1.0)
Myelocytes: 0 %
NEUTROS ABS: 3.2 10*3/uL (ref 1.7–7.7)
Neutrophils Relative %: 5 %
OTHER: 0 %
PROMYELOCYTES RELATIVE: 0 %
Platelets: 134 10*3/uL — ABNORMAL LOW (ref 150–400)
RBC: 2.61 MIL/uL — ABNORMAL LOW (ref 4.22–5.81)
RDW: 22.1 % — ABNORMAL HIGH (ref 11.5–15.5)
WBC: 63.4 10*3/uL — AB (ref 4.0–10.5)
nRBC: 0 /100 WBC

## 2018-01-03 LAB — LACTATE DEHYDROGENASE: LDH: 194 U/L — ABNORMAL HIGH (ref 98–192)

## 2018-01-03 NOTE — Patient Instructions (Signed)
[facility] at [facility] Discharge Instructions  Follow up after your PET scan    Thank you for choosing [facility] at [facility] to provide your oncology and hematology care.  To afford each patient quality time with our provider, please arrive at least 15 minutes before your scheduled appointment time.   If you have a lab appointment with the Cancer Center please come in thru the  Main Entrance and check in at the main information desk  You need to re-schedule your appointment should you arrive 10 or more minutes late.  We strive to give you quality time with our providers, and arriving late affects you and other patients whose appointments are after yours.  Also, if you no show three or more times for appointments you may be dismissed from the clinic at the providers discretion.     Again, thank you for choosing [facility].  Our hope is that these requests will decrease the amount of time that you wait before being seen by our physicians.       _____________________________________________________________  Should you have questions after your visit to  Cancer Center, please contact our office at [phone] between the hours of 8:00 a.m. and 4:30 p.m.  Voicemails left after 4:00 p.m. will not be returned until the following business day.  For prescription refill requests, have your pharmacy contact our office and allow 72 hours.    Cancer Center Support Programs:   > Cancer Support Group  2nd Tuesday of the month 1pm-2pm, Journey Room    

## 2018-01-03 NOTE — Progress Notes (Unsigned)
CRITICAL VALUE ALERT Critical value received:  WBC 63.4 Date of notification:  01-03-18 Time of notification: 7628 Critical value read back:  Yes.   Nurse who received alert:  C. Cletus Paris RN MD notified (1st Rachael Ferrie):  DR. K

## 2018-01-03 NOTE — Assessment & Plan Note (Signed)
1. 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  He could not even tolerate last treatment with obinutuzumab and chlorambucil. -He reports recent onset night sweats, tiredness on exertion and 15 pound weight loss in the last 1 month. -Ibrutinib 140 mg daily started on 11/22/2017 after TLS prophylaxis with rasburicase.  He was hospitalized for the next 2 days for observation with continuous IV fluids and lab monitoring.  He did not develop any tumor lysis syndrome.  He received 1 unit of blood transfusion yesterday prior to discharge. - Ibrutinib was increased to 2 tablets daily on 11/29/2017.  He has noticed easy bruising.  His tumor lysis syndrome labs were within normal limits. - He is continuing to get a good response with downtrending white cell count.  He received 1 unit of blood transfusion on 12/13/2017. - He held his Ibrutinib on 12/24/2017 in anticipation for surgery.  He was seen by Dr. Aleda Grana last week and was referred to Dr. Janace Hoard.  He will be seeing Dr. Janace Hoard next Monday in consultation. - I have asked him to restart on Ibrutinib until we know the date of his surgery.  He has at least a stage III melanoma as he developed left preauricular lymphadenopathy as well as a level 2 lymphadenopathy in the neck.  I have recommended a PET CT scan.  It is hard to differentiate his lymphadenopathy from CLL.  However PET CT scan will help if he has any visceral or bone mets.  I will see him back after the PET CT scan. Today I have reviewed his blood work.  White count is 63 with a hemoglobin of 9.1  and platelet count of 134.  LFTs are slightly elevated.  We will keep a close eye on them.  2.  Left temporal melanoma: -This was reportedly biopsied by Dr. Nevada Crane few months ago and was thought to be melanoma.  Patient developed left preauricular adenopathy in the last few weeks.  Wife also reports that the temporal nodular lesion is increasing in the last 2 to 3 weeks.  He was seen by Dr. Nevada Crane who reportedly removed the temporal lesion on 12/02/2017.  He has an appointment to see Dr. Janace Hoard next Monday.  3. TLS prevention: - He received rasburicase and was hospitalized for 2 days with continuous lab monitoring and IV fluids.  He did not develop any tumor lysis syndrome.  He is currently taking allopurinol daily.

## 2018-01-03 NOTE — Progress Notes (Signed)
[address] [place], [place] [address]   CLINIC:  Medical Oncology/Hematology  PCP:  [name], MD [place] [address] [phone]   REASON FOR VISIT:  Follow-up for Chronic lymphocytic leukemia  CURRENT THERAPY: Ibrutinib 280 mg and allopurinol 300 mg Daily   BRIEF ONCOLOGIC HISTORY:    CLL (chronic lymphocytic leukemia) ([facility])   02/14/2005 Procedure    Peripheral flow cytometry- CLL (without 11q or 17 p deletion) presenting with stage 0 disease    10/25/2008 Progression    Increasing WBC and symptoms suggestive of progressive disease    11/09/2008 - 01/07/2009 Chemotherapy    Rituxan, fludarabine, cytoxan x 3 cycles with the discontinuation of fludarabine and cytoxan due to severe pancytopenia requiring hospitalization.    01/10/2009 Adverse Reaction    Severe pancytopenia and adverse side effects    02/01/2009 - 04/01/2009 Chemotherapy    Rituxan every 21 days x 3 cycles    04/02/2009 Remission    Stable disease.  Chemotherapy holiday started    06/22/2013 - 06/29/2013 Chemotherapy    Imbruvica daily    06/29/2013 Adverse Reaction    Large right posterior leg (popliteal) ecchymosis/violaceous rash.  Likely Imbruvica-induced    07/31/2013 - 10/12/2013 Chemotherapy    Zydelig (idelalisib) 150 mg BID monotherapy    10/12/2013 Adverse Reaction    Transaminitis, suspected to be secondary to [place].  Dose decreased.    10/12/2013 - 11/09/2013 Chemotherapy    Zydelig (idelalisib) 150 mg daily.    11/09/2013 Adverse Reaction    Worsening transaminitis.  Zydelig on HOLD    11/09/2013 Treatment Plan Change    Hold Zydelig    12/28/2013 Adverse Reaction    Herpes Zoster outbreak.  Partial response with Famvir x 1 week, switched to acyclovir.  Progression over 1-2 weeks with development of keratoconjunctivitis bilaterally.  VZ Immune Globulin 625 mg IM given on 01/13/14.    02/08/2014 Treatment Plan Change    Ibrutinib restarted 140  mg daily for one week followed by 280 mg daily followed by 420 mg daily with prophylactic acyclovir.    02/08/2014 - 07/05/2014 Chemotherapy    Ibrutinib 420 mg daily    1May 28, 202015 - 04/17/2014 Hospital Admission    Right lower lobe pneumonia    07/05/2014 Adverse Reaction    Fissuring of fingertips and sore, red feet with transaminitis    07/05/2014 Treatment Plan Change    Hold Ibrutinib    07/22/2014 - 08/19/2014 Chemotherapy    Ibrutinib restarted at 280 mg daily.    08/09/2014 - 08/12/2014 Hospital Admission    Bilateral pneumonia    08/19/2014 Adverse Reaction    Nausea with vomiting.  Escorted to ED.    08/19/2014 Treatment Plan Change    Ibrutinib on hold- ED escort from clinic     10/18/2014 Miscellaneous    B12 weekly x 4 then monthly.    12/15/2015 Imaging    Bone density- BMD as determined from Femur Neck Left is 0.803 g/cm2 with a T-score of -2.1. This patient is considered osteopenic by World Healh Organization (WHO) Criteria.    12/16/2015 Imaging    CT abd/pelvis-  Progression of retroperitoneal and portacaval adenopathy compared to the prior study. Likely related to CLL.  Small hiatal hernia  Mild compression fractures T11 and T12, probably old. Correlate with any pain in this area.    05/22/2016 - 05/28/2016 Chemotherapy    BR day 1 and 2 x 1  cycle.  Bendamustine dose reduced by 50%.    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    01/28/2017 -  Chemotherapy    Cycle 1 day 1 of Gayzva and Chlorambucil. Rasburicase 3 mg given also.     01/28/2017 - 02/08/2017 Hospital Admission    Admitted for sycopal episode due to complete heart block. Patient went into PEA arrest requiring CPR and intubation. 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 Pacemaker placed while inpatient. Started on eliquis.      CANCER STAGING: Cancer Staging No matching staging information was found for the patient.   INTERVAL HISTORY:  [name] 82 y.o. male returns for routine follow-up for CLL. Patient is here today with his wife. He did not have his surgery due to the doctor felt the melanoma was growing and he would need to be admitted to the hospital for the procedure. They will re-evaluate and set a new surgery day. They will let us know so we can hold his medication for a week prior. Patient will start back on his Ibrutinib today. Patient appetite has been down and reports it at 50% now. He is drinking ensure daily to help maintain his weight. Patient denies any new pain. Denies any fevers, night sweats, or chills. Denies any headaches. Denies any nausea, vomiting, or diarrhea.     REVIEW OF SYSTEMS:  Review of Systems  All other systems reviewed and are negative.    PAST MEDICAL/SURGICAL HISTORY:  Past Medical History:  Diagnosis Date  . Anemia   . B12 deficiency 12/07/2014  . CLL (chronic lymphocytic leukemia) ([facility]) 01/24/2011  . Diabetes mellitus   . DM (diabetes mellitus) ([facility]) 01/24/2011  . Eczema 01/24/2011  . History of pneumonia 04/2014  . Hypertension   . Leukemia ([facility]) 6.22.2012   PER PATIENT  . Melanoma in situ ([facility]) 01/24/2011  . Mild obesity 01/24/2011  . Port catheter in place 05/02/2012  . Shingles   . Ulcer    Past Surgical History:  Procedure Laterality Date  . bleeding ulcer    . CATARACT EXTRACTION W/PHACO  05/05/2012   Procedure: CATARACT EXTRACTION PHACO AND INTRAOCULAR LENS PLACEMENT (IOC);  Surgeon: Tonny Branch, MD;  Location: [facility];  Service: Ophthalmology;  Laterality: Right;  CDE:13.25  . CATARACT EXTRACTION W/PHACO  05/15/2012   Procedure: CATARACT EXTRACTION PHACO AND INTRAOCULAR LENS PLACEMENT (IOC);  Surgeon: Tonny Branch, MD;  Location: [facility];  Service: Ophthalmology;  Laterality:  Left;  CDE:  12.32  . COLONOSCOPY N/A 01/30/2016   Procedure: COLONOSCOPY;  Surgeon: Danie Binder, MD;  Location: [facility];  Service: Endoscopy;  Laterality: N/A;  2:00 PM - moved to 1:00 - office notified pt  . ESOPHAGOGASTRODUODENOSCOPY  2008   Dr. Oneida Alar: normal esophagus, antral erythema, 1 cm clean based duodenal ulcera, negative H. plyori  . ESOPHAGOGASTRODUODENOSCOPY N/A 01/30/2016   Procedure: ESOPHAGOGASTRODUODENOSCOPY (EGD);  Surgeon: Danie Binder, MD;  Location: [facility];  Service: Endoscopy;  Laterality: N/A;  . HERNIA REPAIR  2001  . PACEMAKER IMPLANT N/A 02/07/2017   Procedure: PACEMAKER IMPLANT;  Surgeon: Constance Haw, MD;  Location: [facility];  Service: Cardiovascular;  Laterality: N/A;  . PORTACATH PLACEMENT  2008     SOCIAL HISTORY:  Social History   Socioeconomic History  .  Marital status: Married    Spouse name: Not on file  . Number of children: Not on file  . Years of education: Not on file  . Highest education level: Not on file  Occupational History  . Not on file  Social Needs  . Financial resource strain: Not on file  . Food insecurity:    Worry: Not on file    Inability: Not on file  . Transportation needs:    Medical: Not on file    Non-medical: Not on file  Tobacco Use  . Smoking status: Former Smoker    Last attempt to quit: 09/11/1977    Years since quitting: 40.3  . Smokeless tobacco: Former Systems developer    Quit date: 04/25/1967  Substance and Sexual Activity  . Alcohol use: No  . Drug use: No  . Sexual activity: Not on file  Lifestyle  . Physical activity:    Days per week: Not on file    Minutes per session: Not on file  . Stress: Not on file  Relationships  . Social connections:    Talks on phone: Not on file    Gets together: Not on file    Attends religious service: Not on file    Active member of club or organization: Not on file    Attends meetings of clubs or organizations: Not on file    Relationship  status: Not on file  . Intimate partner violence:    Fear of current or ex partner: Not on file    Emotionally abused: Not on file    Physically abused: Not on file    Forced sexual activity: Not on file  Other Topics Concern  . Not on file  Social History Narrative  . Not on file    FAMILY HISTORY:  Family History  Problem Relation Age of Onset  . Diabetes Father   . Colon cancer Neg Hx     CURRENT MEDICATIONS:  Outpatient Encounter Medications as of 01/03/2018  Medication Sig  . ACCU-CHEK AVIVA PLUS test strip   . ACCU-CHEK SOFTCLIX LANCETS lancets   . allopurinol (ZYLOPRIM) 100 MG tablet Take 1 tablet (100 mg total) by mouth daily.  . cholecalciferol (VITAMIN D) 400 units TABS tablet Take 400 Units by mouth daily.  [name] docusate sodium (COLACE) 100 MG capsule Take 100 mg by mouth 2 (two) times daily.  . feeding supplement, ENSURE ENLIVE, (ENSURE ENLIVE) LIQD Take 237 mLs by mouth 2 (two) times daily between meals.  . ibrutinib (IMBRUVICA) 140 MG capsul Take 140 mg PO daily x 7 days and then increase to 280 mg PO daily thereafter (Patient taking differently: Take 140-280 mg by mouth See admin instructions. Take 140 mg  daily x 7 days and then increase to 280 mg daily thereafter starting on 11/22/2017)  . levETIRAcetam (KEPPRA) 100 MG/ML solution Take 7.5 mLs (750 mg total) by mouth 2 (two) times daily.  . rosuvastatin (CRESTOR) 40 MG tablet Take 40 mg by mouth at bedtime.   . sitaGLIPtin (JANUVIA) 100 MG tablet Take 100 mg by mouth daily.  . sodium bicarbonate 650 MG tablet TAKE 1 TABLET BY MOUTH TWICE A DAY AS DIRECTED  . sodium polystyrene (KAYEXALATE) powder   . vitamin C (ASCORBIC ACID) 500 MG tablet Take 500 mg by mouth daily.  . [DISCONTINUED] magic mouthwash w/lidocaine SOLN Take 5 mLs by mouth 4 (four) times daily as needed for mouth pain.   Facility-Administered Encounter Medications as of 01/03/2018  Medication  .  heparin lock flush 100 unit/mL  . sodium chloride flush  (NS) 0.9 % injection 10 mL  . sodium polystyrene (KAYEXALATE) 15 GM/60ML suspension 30 g    ALLERGIES:  Allergies  Allergen Reactions  . Aspirin Other (See Comments)    Bleeding ulcers.   . Ibuprofen Other (See Comments)    Bleeding ulcers.     PHYSICAL EXAM:  ECOG Performance status: 1  Vitals:   01/03/18 1053  BP: 124/68  Pulse: 89  Resp: 16  SpO2: 98%   Filed Weights   01/03/18 1053  Weight: 118 lb 8 oz (53.8 kg)    Physical Exam  Constitutional: He is oriented to person, place, and time. He appears well-developed and well-nourished.  Cardiovascular: Normal rate, regular rhythm and normal heart sounds.  Pulmonary/Chest: Effort normal and breath sounds normal.  Neurological: He is alert and oriented to person, place, and time.  Skin: Skin is warm and dry.  Left temporal melanoma lesion and Band-Aid.  Left preauricular lymphadenopathy palpable.  This is hard and fixed.  There is also level 2 small lymph node about 5 to 8 mm.   LABORATORY DATA:  I have reviewed the labs as listed.  CBC    Component Value Date/Time   WBC 63.4 (HH) 01/03/2018 0910   RBC 2.61 (L) 01/03/2018 0910   HGB 9.1 (L) 01/03/2018 0910   HCT 29.3 (L) 01/03/2018 0910   PLT 134 (L) 01/03/2018 0910   MCV 112.3 (H) 01/03/2018 0910   MCH 34.9 (H) 01/03/2018 0910   MCHC 31.1 01/03/2018 0910   RDW 22.1 (H) 01/03/2018 0910   LYMPHSABS 60.2 (H) 01/03/2018 0910   MONOABS 0.0 (L) 01/03/2018 0910   EOSABS 0.0 01/03/2018 0910   BASOSABS 0.0 01/03/2018 0910   CMP Latest Ref Rng & Units 01/03/2018 12/25/2017 12/16/2017  Glucose 70 - 99 mg/dL 196(H) 102(H) 87  BUN 8 - 23 mg/dL 27(H) 40(H) 45(H)  Creatinine 0.61 - 1.24 mg/dL 1.82(H) 1.63(H) 1.82(H)  Sodium 135 - 145 mmol/L 140 138 138  Potassium 3.5 - 5.1 mmol/L 4.4 4.7 4.8  Chloride 98 - 111 mmol/L 107 103 105  CO2 22 - 32 mmol/L 25 28 27   Calcium 8.9 - 10.3 mg/dL 8.3(L) 8.2(L) 8.5(L)  Total Protein 6.5 - 8.1 g/dL 7.3 7.1 7.1  Total Bilirubin 0.3 -  1.2 mg/dL 0.4 0.6 0.8  Alkaline Phos 38 - 126 U/L 189(H) 63 72  AST 15 - 41 U/L 35 40 17  ALT 0 - 44 U/L 65(H) 29 12       ASSESSMENT & PLAN:   CLL (chronic lymphocytic leukemia) ([facility]) 1. 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  He could not even tolerate last treatment with obinutuzumab and chlorambucil. -He reports recent onset night sweats, tiredness on exertion and 15 pound weight loss in the last 1 month. -Ibrutinib 140 mg daily started on 11/22/2017 after TLS prophylaxis with rasburicase.  He was hospitalized for the next 2 days for observation with continuous IV fluids and lab monitoring.  He did not develop any tumor lysis syndrome.  He received 1 unit of blood transfusion yesterday prior to discharge. - Ibrutinib was  increased to 2 tablets daily on 11/29/2017.  He has noticed easy bruising.  His tumor lysis syndrome labs were within normal limits. - He is continuing to get a good response with downtrending white cell count.  He received 1 unit of blood transfusion on 12/13/2017. - He held his Ibrutinib on 12/24/2017 in anticipation for surgery.  He was seen by Dr. Aleda Grana last week and was referred to Dr. Janace Hoard.  He will be seeing Dr. Janace Hoard next Monday in consultation. - I have asked him to restart on Ibrutinib until we know the date of his surgery.  He has at least a stage III melanoma as he developed left preauricular lymphadenopathy as well as a level 2 lymphadenopathy in the neck.  I have recommended a PET CT scan.  It is hard to differentiate his lymphadenopathy from CLL.   However PET CT scan will help if he has any visceral or bone mets.  I will see him back after the PET CT scan. Today I have reviewed his blood work.  White count is 63 with a hemoglobin of 9.1 and platelet count of 134.  LFTs are slightly elevated.  We will keep a close eye on them.  2.  Left temporal melanoma: -This was reportedly biopsied by Dr. Nevada Crane few months ago and was thought to be melanoma.  Patient developed left preauricular adenopathy in the last few weeks.  Wife also reports that the temporal nodular lesion is increasing in the last 2 to 3 weeks.  He was seen by Dr. Nevada Crane who reportedly removed the temporal lesion on 12/02/2017.  He has an appointment to see Dr. Janace Hoard next Monday.  3. TLS prevention: - He received rasburicase and was hospitalized for 2 days with continuous lab monitoring and IV fluids.  He did not develop any tumor lysis syndrome.  He is currently taking allopurinol daily.           Orders placed this encounter:  Orders Placed This Encounter  Procedures  . NM PET Image Restag (PS) Skull Base To Thigh  . Lactate dehydrogenase  . CBC with Differential/Platelet  . Comprehensive metabolic panel  . Phosphorus  . Uric acid      Derek Jack, MD [place] [phone]

## 2018-01-06 ENCOUNTER — Other Ambulatory Visit (HOSPITAL_COMMUNITY): Payer: Self-pay | Admitting: *Deleted

## 2018-01-06 DIAGNOSIS — C07 Malignant neoplasm of parotid gland: Secondary | ICD-10-CM | POA: Diagnosis not present

## 2018-01-06 DIAGNOSIS — K119 Disease of salivary gland, unspecified: Secondary | ICD-10-CM | POA: Diagnosis not present

## 2018-01-06 DIAGNOSIS — R591 Generalized enlarged lymph nodes: Secondary | ICD-10-CM | POA: Diagnosis not present

## 2018-01-06 MED ORDER — IBRUTINIB 140 MG PO CAPS: 280.0000 mg | ORAL_CAPSULE | Freq: Every day | ORAL | 0 refills | Status: DC

## 2018-01-06 NOTE — Telephone Encounter (Signed)
Chart reviewed, per Dr. Tomie China last office note, patient is taking 2 pills by mouth daily, prescription changed to reflect that and refilled.

## 2018-01-10 ENCOUNTER — Encounter (HOSPITAL_COMMUNITY): Payer: Medicare Other

## 2018-01-10 ENCOUNTER — Ambulatory Visit (HOSPITAL_COMMUNITY)
Admission: RE | Admit: 2018-01-10 | Discharge: 2018-01-10 | Disposition: A | Discharge status: Home / Self Care | Payer: Medicare Other | Source: Ambulatory Visit | Attending: Nurse Practitioner | Admitting: Nurse Practitioner

## 2018-01-10 DIAGNOSIS — K118 Other diseases of salivary glands: Secondary | ICD-10-CM | POA: Insufficient documentation

## 2018-01-10 DIAGNOSIS — C9110 Chronic lymphocytic leukemia of B-cell type not having achieved remission: Secondary | ICD-10-CM | POA: Diagnosis not present

## 2018-01-10 DIAGNOSIS — R937 Abnormal findings on diagnostic imaging of other parts of musculoskeletal system: Secondary | ICD-10-CM | POA: Diagnosis not present

## 2018-01-10 DIAGNOSIS — C9190 Lymphoid leukemia, unspecified not having achieved remission: Secondary | ICD-10-CM | POA: Diagnosis present

## 2018-01-10 DIAGNOSIS — R933 Abnormal findings on diagnostic imaging of other parts of digestive tract: Secondary | ICD-10-CM | POA: Insufficient documentation

## 2018-01-10 LAB — GLUCOSE, CAPILLARY: Glucose-Capillary: 94 mg/dL (ref 70–99)

## 2018-01-10 MED ORDER — FLUDEOXYGLUCOSE F - 18 (FDG) INJECTION: 5.8000 | Freq: Once | INTRAVENOUS | Status: DC | PRN

## 2018-01-14 ENCOUNTER — Other Ambulatory Visit (HOSPITAL_COMMUNITY): Payer: Self-pay | Admitting: Nurse Practitioner

## 2018-01-16 ENCOUNTER — Inpatient Hospital Stay (HOSPITAL_COMMUNITY): Payer: Medicare Other | Attending: Hematology

## 2018-01-16 ENCOUNTER — Encounter (HOSPITAL_COMMUNITY): Payer: Self-pay | Admitting: Hematology

## 2018-01-16 ENCOUNTER — Other Ambulatory Visit: Payer: Self-pay

## 2018-01-16 ENCOUNTER — Inpatient Hospital Stay (HOSPITAL_BASED_OUTPATIENT_CLINIC_OR_DEPARTMENT_OTHER): Payer: Medicare Other | Admitting: Hematology

## 2018-01-16 VITALS — BP 112/59 | HR 86 | Temp 98.6°F | Resp 18 | Wt 120.3 lb

## 2018-01-16 DIAGNOSIS — C9110 Chronic lymphocytic leukemia of B-cell type not having achieved remission: Secondary | ICD-10-CM | POA: Diagnosis not present

## 2018-01-16 DIAGNOSIS — N390 Urinary tract infection, site not specified: Secondary | ICD-10-CM

## 2018-01-16 DIAGNOSIS — Z79899 Other long term (current) drug therapy: Secondary | ICD-10-CM | POA: Insufficient documentation

## 2018-01-16 DIAGNOSIS — Z9221 Personal history of antineoplastic chemotherapy: Secondary | ICD-10-CM

## 2018-01-16 DIAGNOSIS — Z85828 Personal history of other malignant neoplasm of skin: Secondary | ICD-10-CM | POA: Insufficient documentation

## 2018-01-16 DIAGNOSIS — Z85820 Personal history of malignant melanoma of skin: Secondary | ICD-10-CM

## 2018-01-16 LAB — CBC WITH DIFFERENTIAL/PLATELET
Basophils Absolute: 0.2 10*3/uL — ABNORMAL HIGH (ref 0.0–0.1)
Basophils Relative: 0 %
EOS PCT: 1 %
Eosinophils Absolute: 0.6 10*3/uL (ref 0.0–0.7)
HEMATOCRIT: 28.3 % — AB (ref 39.0–52.0)
HEMOGLOBIN: 8.7 g/dL — AB (ref 13.0–17.0)
LYMPHS ABS: 72.9 10*3/uL (ref 0.7–4.0)
LYMPHS PCT: 96 %
MCH: 35.2 pg — ABNORMAL HIGH (ref 26.0–34.0)
MCHC: 30.7 g/dL (ref 30.0–36.0)
MCV: 114.6 fL — ABNORMAL HIGH (ref 78.0–100.0)
Monocytes Absolute: 0.7 10*3/uL (ref 0.1–1.0)
Monocytes Relative: 1 %
Neutro Abs: 1.7 10*3/uL (ref 1.7–7.7)
Neutrophils Relative %: 2 %
Platelets: 194 10*3/uL (ref 150–400)
RBC: 2.47 MIL/uL — AB (ref 4.22–5.81)
RDW: 21 % — ABNORMAL HIGH (ref 11.5–15.5)
WBC: 76 10*3/uL — AB (ref 4.0–10.5)

## 2018-01-16 LAB — COMPREHENSIVE METABOLIC PANEL
ALBUMIN: 3.5 g/dL (ref 3.5–5.0)
ALK PHOS: 121 U/L (ref 38–126)
ALT: 28 U/L (ref 0–44)
AST: 30 U/L (ref 15–41)
Anion gap: 8 (ref 5–15)
BILIRUBIN TOTAL: 0.6 mg/dL (ref 0.3–1.2)
BUN: 38 mg/dL — AB (ref 8–23)
CO2: 25 mmol/L (ref 22–32)
Calcium: 8.5 mg/dL — ABNORMAL LOW (ref 8.9–10.3)
Chloride: 104 mmol/L (ref 98–111)
Creatinine, Ser: 2.02 mg/dL — ABNORMAL HIGH (ref 0.61–1.24)
GFR calc Af Amer: 34 mL/min — ABNORMAL LOW (ref >60)
GFR calc non Af Amer: 29 mL/min — ABNORMAL LOW (ref >60)
GLUCOSE: 156 mg/dL — AB (ref 70–99)
Potassium: 4.9 mmol/L (ref 3.5–5.1)
Sodium: 137 mmol/L (ref 135–145)
TOTAL PROTEIN: 7.4 g/dL (ref 6.5–8.1)

## 2018-01-16 LAB — URINALYSIS, ROUTINE W REFLEX MICROSCOPIC
Bilirubin Urine: NEGATIVE
Glucose, UA: NEGATIVE mg/dL
KETONES UR: NEGATIVE mg/dL
Nitrite: POSITIVE — AB
PH: 8 (ref 5.0–8.0)
Protein, ur: 30 mg/dL — AB
SPECIFIC GRAVITY, URINE: 1.008 (ref 1.005–1.030)

## 2018-01-16 LAB — PHOSPHORUS: Phosphorus: 4.4 mg/dL (ref 2.5–4.6)

## 2018-01-16 LAB — URIC ACID: Uric Acid, Serum: 5.4 mg/dL (ref 3.7–8.6)

## 2018-01-16 LAB — LACTATE DEHYDROGENASE: LDH: 132 U/L (ref 98–192)

## 2018-01-16 MED ORDER — CIPROFLOXACIN HCL 250 MG PO TABS: 250.0000 mg | ORAL_TABLET | Freq: Every day | ORAL | 0 refills | Status: AC

## 2018-01-16 NOTE — Patient Instructions (Signed)
[facility] Discharge Instructions     Thank you for choosing [facility] at [facility] to provide your oncology and hematology care.  To afford each patient quality time with our provider, please arrive at least 15 minutes before your scheduled appointment time.   If you have a lab appointment with the Cancer Center please come in thru the  Main Entrance and check in at the main information desk  You need to re-schedule your appointment should you arrive 10 or more minutes late.  We strive to give you quality time with our providers, and arriving late affects you and other patients whose appointments are after yours.  Also, if you no show three or more times for appointments you may be dismissed from the clinic at the providers discretion.     Again, thank you for choosing [facility].  Our hope is that these requests will decrease the amount of time that you wait before being seen by our physicians.       _____________________________________________________________  Should you have questions after your visit to [facility], please contact our office at [phone] between the hours of 8:00 a.m. and 4:30 p.m.  Voicemails left after 4:00 p.m. will not be returned until the following business day.  For prescription refill requests, have your pharmacy contact our office and allow 72 hours.    Cancer Center Support Programs:   > Cancer Support Group  2nd Tuesday of the month 1pm-2pm, Journey Room    

## 2018-01-16 NOTE — Assessment & Plan Note (Signed)
1. 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  He could not even tolerate last treatment with obinutuzumab and chlorambucil. -He reports recent onset night sweats, tiredness on exertion and 15 pound weight loss in the last 1 month. -Ibrutinib 140 mg daily started on 11/22/2017 after TLS prophylaxis with rasburicase.  He was hospitalized for the next 2 days for observation with continuous IV fluids and lab monitoring.  He did not develop any tumor lysis syndrome.  He received 1 unit of blood transfusion yesterday prior to discharge. - Ibrutinib was increased to 2 tablets daily on 11/29/2017.  He has noticed easy bruising.  His tumor lysis syndrome labs were within normal limits. - He is continuing to get a good response with downtrending white cell count.  He received 1 unit of blood transfusion on 12/13/2017. - He is continuing ibrutinib at this time. - He was seen by Dr. Janace Hoard last Monday, who did a needle biopsy of the left preauricular lesion.  The results of the biopsy are pending at this time. - I discussed the results of the PET CT scan dated 01/10/2018 which showed 3 hypermetabolic lesions associated with the left parotid gland, largest bulges the skin surface.  There is additional necrotic level 2 lymph node.  I think these lymph nodes are coming from melanoma. -The PET scan also showed mildly enlarged axillary, mediastinal, periaortic lymph nodes from his CLL.  There is also focal hypermetabolic activity along the gastric fundus and distal esophagus.  This is likely gastritis.  He had a  history of peptic ulcer. - He has no follow-up with Dr. Janace Hoard yet.  It is highly likely that his office will call him for follow-up.  If he has surgically resectable disease, I have instructed him to stop ibrutinib 5 to 7 days prior to surgery. - I will see him back in 4 weeks for follow-up.  2.  Left temporal melanoma: -Dr. Nevada Crane did a biopsy on 12/02/2017 which confirmed melanoma.  Patient developed left periauricular lymphadenopathy in the last few months.  He was seen by Dr. Janace Hoard last Monday who did a biopsy of his preauricular lymph node.  Results of it are pending at this time.   3. TLS prevention: - He received rasburicase and was hospitalized for 2 days with continuous lab monitoring and IV fluids.  He did not develop any tumor lysis syndrome.  He is currently taking allopurinol daily.

## 2018-01-16 NOTE — Progress Notes (Signed)
[address] [place], [place] [address]   CLINIC:  Medical Oncology/Hematology  PCP:  [name], MD [place] [id number] [phone]   REASON FOR VISIT: Follow-up for chronic lymphocytic leukemia (CLL)  CURRENT THERAPY: Ibrutinib 280 MG and allopurinol 300 MG daily  BRIEF ONCOLOGIC HISTORY:    CLL (chronic lymphocytic leukemia) ([place])   02/14/2005 Procedure    Peripheral flow cytometry- CLL (without 11q or 17 p deletion) presenting with stage 0 disease    10/25/2008 Progression    Increasing WBC and symptoms suggestive of progressive disease    11/09/2008 - 01/07/2009 Chemotherapy    Rituxan, fludarabine, cytoxan x 3 cycles with the discontinuation of fludarabine and cytoxan due to severe pancytopenia requiring hospitalization.    01/10/2009 Adverse Reaction    Severe pancytopenia and adverse side effects    02/01/2009 - 04/01/2009 Chemotherapy    Rituxan every 21 days x 3 cycles    04/02/2009 Remission    Stable disease.  Chemotherapy holiday started    06/22/2013 - 06/29/2013 Chemotherapy    Imbruvica daily    06/29/2013 Adverse Reaction    Large right posterior leg (popliteal) ecchymosis/violaceous rash.  Likely Imbruvica-induced    07/31/2013 - 10/12/2013 Chemotherapy    Zydelig (idelalisib) 150 mg BID monotherapy    10/12/2013 Adverse Reaction    Transaminitis, suspected to be secondary to [place].  Dose decreased.    10/12/2013 - 11/09/2013 Chemotherapy    Zydelig (idelalisib) 150 mg daily.    11/09/2013 Adverse Reaction    Worsening transaminitis.  Zydelig on HOLD    11/09/2013 Treatment Plan Change    Hold Zydelig    12/28/2013 Adverse Reaction    Herpes Zoster outbreak.  Partial response with Famvir x 1 week, switched to acyclovir.  Progression over 1-2 weeks with development of keratoconjunctivitis bilaterally.  VZ Immune Globulin 625 mg IM given on 01/13/14.    02/08/2014 Treatment Plan Change    Ibrutinib restarted  140 mg daily for one week followed by 280 mg daily followed by 420 mg daily with prophylactic acyclovir.    02/08/2014 - 07/05/2014 Chemotherapy    Ibrutinib 420 mg daily    110-20-2015 - 04/17/2014 Hospital Admission    Right lower lobe pneumonia    07/05/2014 Adverse Reaction    Fissuring of fingertips and sore, red feet with transaminitis    07/05/2014 Treatment Plan Change    Hold Ibrutinib    07/22/2014 - 08/19/2014 Chemotherapy    Ibrutinib restarted at 280 mg daily.    08/09/2014 - 08/12/2014 Hospital Admission    Bilateral pneumonia    08/19/2014 Adverse Reaction    Nausea with vomiting.  Escorted to ED.    08/19/2014 Treatment Plan Change    Ibrutinib on hold- ED escort from clinic     10/18/2014 Miscellaneous    B12 weekly x 4 then monthly.    12/15/2015 Imaging    Bone density- BMD as determined from Femur Neck Left is 0.803 g/cm2 with a T-score of -2.1. This patient is considered osteopenic by World Healh Organization (WHO) Criteria.    12/16/2015 Imaging    CT abd/pelvis-  Progression of retroperitoneal and portacaval adenopathy compared to the prior study. Likely related to CLL.  Small hiatal hernia  Mild compression fractures T11 and T12, probably old. Correlate with any pain in this area.    05/22/2016 - 05/28/2016 Chemotherapy    BR day 1 and 2 x 1 cycle.  Bendamustine dose reduced by 50%.    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    01/28/2017 -  Chemotherapy    Cycle 1 day 1 of Gayzva and Chlorambucil. Rasburicase 3 mg given also.     01/28/2017 - 02/08/2017 Hospital Admission    Admitted for sycopal episode due to complete heart block. Patient went into PEA arrest requiring CPR and intubation. 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 Pacemaker placed while inpatient. Started on eliquis.     INTERVAL HISTORY:  Mr. Wilczynski 82 y.o. male returns for routine follow-up for chronic lymphocytic leukemia. Patient is here today with his wife. He is doing well. He reports burning with urination for 2 weeks now. WE will get a UA and culture today. He reports his appetite at 50% and he is drinking 2-3 ensure daily. He is maintaining his weight. His energy level is 25%. He denies any fevers, chill, or night sweats. Denies any new pains. Denies any nausea, vomiting, or dairrhea.    REVIEW OF SYSTEMS:  Review of Systems  HENT:   Positive for trouble swallowing.   All other systems reviewed and are negative.    PAST MEDICAL/SURGICAL HISTORY:  Past Medical History:  Diagnosis Date  . Anemia   . B12 deficiency 12/07/2014  . CLL (chronic lymphocytic leukemia) ([place]) 01/24/2011  . Diabetes mellitus   . DM (diabetes mellitus) ([place]) 01/24/2011  . Eczema 01/24/2011  . History of pneumonia 04/2014  . Hypertension   . Leukemia ([place]) 6.22.2012   PER PATIENT  . Melanoma in situ ([place]) 01/24/2011  . Mild obesity 01/24/2011  . Port catheter in place 05/02/2012  . Shingles   . Ulcer    Past Surgical History:  Procedure Laterality Date  . bleeding ulcer    . CATARACT EXTRACTION W/PHACO  05/05/2012   Procedure: CATARACT EXTRACTION PHACO AND INTRAOCULAR LENS PLACEMENT (IOC);  Surgeon: Tonny Branch, MD;  Location: [facility];  Service: Ophthalmology;  Laterality: Right;  CDE:13.25  . CATARACT EXTRACTION W/PHACO  05/15/2012   Procedure: CATARACT EXTRACTION PHACO AND INTRAOCULAR LENS PLACEMENT (IOC);  Surgeon: Tonny Branch, MD;  Location: [facility];  Service: Ophthalmology;  Laterality: Left;  CDE:  12.32  . COLONOSCOPY N/A 01/30/2016   Procedure: COLONOSCOPY;  Surgeon: Danie Binder, MD;  Location: [facility];  Service: Endoscopy;  Laterality: N/A;  2:00 PM - moved to 1:00 - office notified pt  . ESOPHAGOGASTRODUODENOSCOPY  2008   Dr.  Oneida Alar: normal esophagus, antral erythema, 1 cm clean based duodenal ulcera, negative H. plyori  . ESOPHAGOGASTRODUODENOSCOPY N/A 01/30/2016   Procedure: ESOPHAGOGASTRODUODENOSCOPY (EGD);  Surgeon: Danie Binder, MD;  Location: [facility];  Service: Endoscopy;  Laterality: N/A;  . HERNIA REPAIR  2001  . PACEMAKER IMPLANT N/A 02/07/2017   Procedure: PACEMAKER IMPLANT;  Surgeon: Constance Haw, MD;  Location: [facility];  Service: Cardiovascular;  Laterality: N/A;  . PORTACATH PLACEMENT  2008     SOCIAL HISTORY:  Social History   Socioeconomic History  . Marital status: Married    Spouse name: Not on file  . Number of children: Not on file  . Years of education: Not on file  . Highest education level: Not on file  Occupational History  . Not on file  Social Needs  .  Financial resource strain: Not on file  . Food insecurity:    Worry: Not on file    Inability: Not on file  . Transportation needs:    Medical: Not on file    Non-medical: Not on file  Tobacco Use  . Smoking status: Former Smoker    Last attempt to quit: 09/11/1977    Years since quitting: 40.3  . Smokeless tobacco: Former Systems developer    Quit date: 04/25/1967  Substance and Sexual Activity  . Alcohol use: No  . Drug use: No  . Sexual activity: Not on file  Lifestyle  . Physical activity:    Days per week: Not on file    Minutes per session: Not on file  . Stress: Not on file  Relationships  . Social connections:    Talks on phone: Not on file    Gets together: Not on file    Attends religious service: Not on file    Active member of club or organization: Not on file    Attends meetings of clubs or organizations: Not on file    Relationship status: Not on file  . Intimate partner violence:    Fear of current or ex partner: Not on file    Emotionally abused: Not on file    Physically abused: Not on file    Forced sexual activity: Not on file  Other Topics Concern  . Not on file  Social History  Narrative  . Not on file    FAMILY HISTORY:  Family History  Problem Relation Age of Onset  . Diabetes Father   . Colon cancer Neg Hx     CURRENT MEDICATIONS:  Outpatient Encounter Medications as of 01/16/2018  Medication Sig  . ACCU-CHEK AVIVA PLUS test strip   . ACCU-CHEK SOFTCLIX LANCETS lancets   . allopurinol (ZYLOPRIM) 100 MG tablet Take 1 tablet (100 mg total) by mouth daily.  . cholecalciferol (VITAMIN D) 400 units TABS tablet Take 400 Units by mouth daily.  [name] docusate sodium (COLACE) 100 MG capsule Take 100 mg by mouth 2 (two) times daily.  . feeding supplement, ENSURE ENLIVE, (ENSURE ENLIVE) LIQD Take 237 mLs by mouth 2 (two) times daily between meals.  . ibrutinib (IMBRUVICA) 140 MG capsul Take 2 capsules (280 mg total) by mouth daily.  [name] levETIRAcetam (KEPPRA) 100 MG/ML solution Take 7.5 mLs (750 mg total) by mouth 2 (two) times daily.  . rosuvastatin (CRESTOR) 40 MG tablet Take 40 mg by mouth at bedtime.   . sitaGLIPtin (JANUVIA) 100 MG tablet Take 100 mg by mouth daily.  . sodium bicarbonate 650 MG tablet TAKE 1 TABLET BY MOUTH TWICE A DAY AS DIRECTED  . sodium polystyrene (KAYEXALATE) powder   . vitamin C (ASCORBIC ACID) 500 MG tablet Take 500 mg by mouth daily.   Facility-Administered Encounter Medications as of 01/16/2018  Medication  . heparin lock flush 100 unit/mL  . sodium chloride flush (NS) 0.9 % injection 10 mL  . sodium polystyrene (KAYEXALATE) 15 GM/60ML suspension 30 g    ALLERGIES:  Allergies  Allergen Reactions  . Aspirin Other (See Comments)    Bleeding ulcers.   . Ibuprofen Other (See Comments)    Bleeding ulcers.     PHYSICAL EXAM:  ECOG Performance status: 2  Vitals:   01/16/18 1036  BP: (!) 112/59  Pulse: 86  Resp: 18  Temp: 98.6 F (37 C)  SpO2: 100%   Filed Weights   01/16/18 1036  Weight: 120 lb 4.8  oz (54.6 kg)    Physical Exam  Constitutional: He is oriented to person, place, and time. He appears well-developed and  well-nourished.  Cardiovascular: Normal rate, regular rhythm and normal heart sounds.  Pulmonary/Chest: Effort normal and breath sounds normal.  Neurological: He is alert and oriented to person, place, and time.  Skin: Skin is warm and dry.     LABORATORY DATA:  I have reviewed the labs as listed.  CBC    Component Value Date/Time   WBC 76.0 (HH) 01/16/2018 0959   RBC 2.47 (L) 01/16/2018 0959   HGB 8.7 (L) 01/16/2018 0959   HCT 28.3 (L) 01/16/2018 0959   PLT 194 01/16/2018 0959   MCV 114.6 (H) 01/16/2018 0959   MCH 35.2 (H) 01/16/2018 0959   MCHC 30.7 01/16/2018 0959   RDW 21.0 (H) 01/16/2018 0959   LYMPHSABS 72.9 01/16/2018 0959   MONOABS 0.7 01/16/2018 0959   EOSABS 0.6 01/16/2018 0959   BASOSABS 0.2 (H) 01/16/2018 0959   CMP Latest Ref Rng & Units 01/16/2018 01/03/2018 12/25/2017  Glucose 70 - 99 mg/dL 156(H) 196(H) 102(H)  BUN 8 - 23 mg/dL 38(H) 27(H) 40(H)  Creatinine 0.61 - 1.24 mg/dL 2.02(H) 1.82(H) 1.63(H)  Sodium 135 - 145 mmol/L 137 140 138  Potassium 3.5 - 5.1 mmol/L 4.9 4.4 4.7  Chloride 98 - 111 mmol/L 104 107 103  CO2 22 - 32 mmol/L 25 25 28   Calcium 8.9 - 10.3 mg/dL 8.5(L) 8.3(L) 8.2(L)  Total Protein 6.5 - 8.1 g/dL 7.4 7.3 7.1  Total Bilirubin 0.3 - 1.2 mg/dL 0.6 0.4 0.6  Alkaline Phos 38 - 126 U/L 121 189(H) 63  AST 15 - 41 U/L 30 35 40  ALT 0 - 44 U/L 28 65(H) 29       DIAGNOSTIC IMAGING:  I have personally reviewed images of his PET CT scan and discussed with the patient.     ASSESSMENT & PLAN:   CLL (chronic lymphocytic leukemia) ([place]) 1. Stage IV CLL with history of poor tolerance to past chemotherapies: -Could not tolerate past treatments including FCR in 2010, Imbruvica 2015 (located by large leg rash), Zydelig 8527 (complicated by transaminitis despite dose reduction), Imbruvica rechallenge in 7824 (complicated by infection and progressive fatigue), bendamustine/Rituxan 1 cycle in 2353 (complicated by prolonged and significant cytopenia in  addition to tumor lysis syndrome and renal failure).  He could not even tolerate last treatment with obinutuzumab and chlorambucil. -He reports recent onset night sweats, tiredness on exertion and 15 pound weight loss in the last 1 month. -Ibrutinib 140 mg daily started on 11/22/2017 after TLS prophylaxis with rasburicase.  He was hospitalized for the next 2 days for observation with continuous IV fluids and lab monitoring.  He did not develop any tumor lysis syndrome.  He received 1 unit of blood transfusion yesterday prior to discharge. - Ibrutinib was increased to 2 tablets daily on 11/29/2017.  He has noticed easy bruising.  His tumor lysis syndrome labs were within normal limits. - He is continuing to get a good response with downtrending white cell count.  He received 1 unit of blood transfusion on 12/13/2017. - He is continuing ibrutinib at this time. - He was seen by Dr. Janace Hoard last Monday, who did a needle biopsy of the left preauricular lesion.  The results of the biopsy are pending at this time. - I discussed the results of the PET CT scan dated 01/10/2018 which showed 3 hypermetabolic lesions associated with the left parotid gland, largest bulges  the skin surface.  There is additional necrotic level 2 lymph node.  I think these lymph nodes are coming from melanoma. -The PET scan also showed mildly enlarged axillary, mediastinal, periaortic lymph nodes from his CLL.  There is also focal hypermetabolic activity along the gastric fundus and distal esophagus.  This is likely gastritis.  He had a history of peptic ulcer. - He has no follow-up with Dr. Janace Hoard yet.  It is highly likely that his office will call him for follow-up.  If he has surgically resectable disease, I have instructed him to stop ibrutinib 5 to 7 days prior to surgery. - I will see him back in 4 weeks for follow-up.  2.  Left temporal melanoma: -Dr. Nevada Crane did a biopsy on 12/02/2017 which confirmed melanoma.  Patient developed left  periauricular lymphadenopathy in the last few months.  He was seen by Dr. Janace Hoard last Monday who did a biopsy of his preauricular lymph node.  Results of it are pending at this time.   3. TLS prevention: - He received rasburicase and was hospitalized for 2 days with continuous lab monitoring and IV fluids.  He did not develop any tumor lysis syndrome.  He is currently taking allopurinol daily.           Orders placed this encounter:  Orders Placed This Encounter  Procedures  . Urine culture  . Urine culture  . Urinalysis, Routine w reflex microscopic  . Urinalysis, Routine w reflex microscopic  . Magnesium  . CBC with Differential/Platelet  . Comprehensive metabolic panel  . Phosphorus  . Lactate dehydrogenase  . Uric acid      Derek Jack, MD [place] [phone]

## 2018-01-16 NOTE — Addendum Note (Signed)
Addended by: Glennie Isle on: 01/16/2018 05:38 PM   Modules accepted: Orders

## 2018-01-19 LAB — URINE CULTURE

## 2018-01-20 ENCOUNTER — Other Ambulatory Visit (HOSPITAL_COMMUNITY): Payer: Self-pay | Admitting: *Deleted

## 2018-01-20 DIAGNOSIS — C9110 Chronic lymphocytic leukemia of B-cell type not having achieved remission: Secondary | ICD-10-CM

## 2018-01-20 DIAGNOSIS — R591 Generalized enlarged lymph nodes: Secondary | ICD-10-CM | POA: Diagnosis not present

## 2018-01-20 NOTE — Progress Notes (Signed)
Patient is taking Cipro x 1 week. I called to verify and patient hasn't started it.  I advised him to get the medication from the pharmacy and start taking it today. He is taking it x 1 week and then will return for repeat urinalysis.  Patient is aware of appointment next Monday for test.  He verbalizes understanding of taking medication daily x 1 week.

## 2018-01-21 DIAGNOSIS — Z6821 Body mass index (BMI) 21.0-21.9, adult: Secondary | ICD-10-CM | POA: Diagnosis not present

## 2018-01-21 DIAGNOSIS — J0100 Acute maxillary sinusitis, unspecified: Secondary | ICD-10-CM | POA: Diagnosis not present

## 2018-01-21 DIAGNOSIS — R110 Nausea: Secondary | ICD-10-CM | POA: Diagnosis not present

## 2018-01-21 DIAGNOSIS — J181 Lobar pneumonia, unspecified organism: Secondary | ICD-10-CM | POA: Diagnosis not present

## 2018-01-21 DIAGNOSIS — E119 Type 2 diabetes mellitus without complications: Secondary | ICD-10-CM | POA: Diagnosis not present

## 2018-01-21 DIAGNOSIS — R220 Localized swelling, mass and lump, head: Secondary | ICD-10-CM | POA: Diagnosis not present

## 2018-01-24 ENCOUNTER — Other Ambulatory Visit: Payer: Self-pay

## 2018-01-24 ENCOUNTER — Encounter (HOSPITAL_COMMUNITY): Payer: Self-pay

## 2018-01-24 ENCOUNTER — Inpatient Hospital Stay (HOSPITAL_COMMUNITY): Payer: Medicare Other

## 2018-01-24 VITALS — BP 112/71 | HR 88 | Temp 98.1°F | Resp 18

## 2018-01-24 DIAGNOSIS — Z85828 Personal history of other malignant neoplasm of skin: Secondary | ICD-10-CM | POA: Diagnosis not present

## 2018-01-24 DIAGNOSIS — C9110 Chronic lymphocytic leukemia of B-cell type not having achieved remission: Secondary | ICD-10-CM | POA: Diagnosis not present

## 2018-01-24 DIAGNOSIS — E538 Deficiency of other specified B group vitamins: Secondary | ICD-10-CM

## 2018-01-24 DIAGNOSIS — Z9221 Personal history of antineoplastic chemotherapy: Secondary | ICD-10-CM | POA: Diagnosis not present

## 2018-01-24 DIAGNOSIS — Z79899 Other long term (current) drug therapy: Secondary | ICD-10-CM | POA: Diagnosis not present

## 2018-01-24 MED ORDER — CYANOCOBALAMIN 1000 MCG/ML IJ SOLN
1000.0000 ug | Freq: Once | INTRAMUSCULAR | Status: AC
  Administered 2018-01-24: 1000 ug via INTRAMUSCULAR

## 2018-01-24 NOTE — Progress Notes (Signed)
Nutrition Follow-up:  Patient with stage IV CLL and left temporal melanoma.  Patient reports will have surgery on his forehead but does not know when.    Met with patient and wife today following B12 injection.  Patient reports appetite is good.  Has started walking about 1 mile per day per wife.  Reports drinking 2 oral nutrition supplements per day.  Reports ate eggs and bacon toast and coffee for breakfast this am.  Some mornings have oatmeal and applesauce, or pancakes or gravy biscuit.  Lunch is usually hot meal that wife prepares (ie pinto beans, meatloaf, green beans). Supper is left overs from lunch.  Going to the race tonight and planning on eating [employer] chicken.    No nutrition impact symptoms reported.    Medications: reviewed  Labs: BUN 38, creatinine 2.02, glucose 156  Anthropometrics:   Weight stable at 120 lb 4.8 oz on 9/5 same as on 8/16  UBW 125 lb  NUTRITION DIAGNOSIS: Unintentional weight loss improving   INTERVENTION:  Encouraged continues use of oral nutrition supplements.  Wife reports does not need any supplements today Encouraged patient to continue to eat well balanced diet in preparation for surgery and to continue activity.      MONITORING, EVALUATION, GOAL: weight trends, intake   NEXT VISIT: October 4  Gardy Montanari B. Zenia Resides, [place], [place] Registered Dietitian [phone] (pager)

## 2018-01-24 NOTE — Progress Notes (Signed)
Randall Reyes presents today for injection per the provider's orders.  B12 administration without incident; see MAR for injection details.  Patient tolerated procedure well and without incident.  No questions or complaints noted at this time.  Discharged ambulatory in c/o spouse.

## 2018-01-27 ENCOUNTER — Inpatient Hospital Stay (HOSPITAL_COMMUNITY): Payer: Medicare Other

## 2018-01-27 DIAGNOSIS — C9110 Chronic lymphocytic leukemia of B-cell type not having achieved remission: Secondary | ICD-10-CM

## 2018-01-27 DIAGNOSIS — Z85828 Personal history of other malignant neoplasm of skin: Secondary | ICD-10-CM | POA: Diagnosis not present

## 2018-01-27 DIAGNOSIS — Z9221 Personal history of antineoplastic chemotherapy: Secondary | ICD-10-CM | POA: Diagnosis not present

## 2018-01-27 DIAGNOSIS — Z79899 Other long term (current) drug therapy: Secondary | ICD-10-CM | POA: Diagnosis not present

## 2018-01-27 LAB — COMPREHENSIVE METABOLIC PANEL
ALK PHOS: 75 U/L (ref 38–126)
ALT: 39 U/L (ref 0–44)
AST: 48 U/L — AB (ref 15–41)
Albumin: 3.4 g/dL — ABNORMAL LOW (ref 3.5–5.0)
Anion gap: 5 (ref 5–15)
BILIRUBIN TOTAL: 0.6 mg/dL (ref 0.3–1.2)
BUN: 30 mg/dL — AB (ref 8–23)
CALCIUM: 8.1 mg/dL — AB (ref 8.9–10.3)
CO2: 29 mmol/L (ref 22–32)
Chloride: 106 mmol/L (ref 98–111)
Creatinine, Ser: 1.76 mg/dL — ABNORMAL HIGH (ref 0.61–1.24)
GFR calc Af Amer: 40 mL/min — ABNORMAL LOW (ref >60)
GFR calc non Af Amer: 35 mL/min — ABNORMAL LOW (ref >60)
Glucose, Bld: 113 mg/dL — ABNORMAL HIGH (ref 70–99)
POTASSIUM: 4.6 mmol/L (ref 3.5–5.1)
Sodium: 140 mmol/L (ref 135–145)
TOTAL PROTEIN: 7 g/dL (ref 6.5–8.1)

## 2018-01-27 LAB — URINALYSIS, ROUTINE W REFLEX MICROSCOPIC
Bilirubin Urine: NEGATIVE
Glucose, UA: NEGATIVE mg/dL
HGB URINE DIPSTICK: NEGATIVE
Ketones, ur: NEGATIVE mg/dL
LEUKOCYTES UA: NEGATIVE
Nitrite: NEGATIVE
Protein, ur: NEGATIVE mg/dL
SPECIFIC GRAVITY, URINE: 1.015 (ref 1.005–1.030)
pH: 7 (ref 5.0–8.0)

## 2018-01-27 LAB — URIC ACID: Uric Acid, Serum: 4.5 mg/dL (ref 3.7–8.6)

## 2018-01-27 LAB — MAGNESIUM: Magnesium: 2 mg/dL (ref 1.7–2.4)

## 2018-01-27 LAB — CBC WITH DIFFERENTIAL/PLATELET
Basophils Absolute: 0 10*3/uL (ref 0.0–0.1)
Basophils Relative: 0 %
Eosinophils Absolute: 0.7 10*3/uL (ref 0.0–0.7)
Eosinophils Relative: 1 %
HEMATOCRIT: 28.5 % — AB (ref 39.0–52.0)
HEMOGLOBIN: 8.7 g/dL — AB (ref 13.0–17.0)
LYMPHS PCT: 95 %
Lymphs Abs: 61.9 10*3/uL — ABNORMAL HIGH (ref 0.7–4.0)
MCH: 35.7 pg — ABNORMAL HIGH (ref 26.0–34.0)
MCHC: 30.5 g/dL (ref 30.0–36.0)
MCV: 116.8 fL — ABNORMAL HIGH (ref 78.0–100.0)
MONOS PCT: 1 %
Monocytes Absolute: 0.7 10*3/uL (ref 0.1–1.0)
NEUTROS PCT: 3 %
Neutro Abs: 2 10*3/uL (ref 1.7–7.7)
Platelets: 126 10*3/uL — ABNORMAL LOW (ref 150–400)
RBC: 2.44 MIL/uL — AB (ref 4.22–5.81)
RDW: 19.3 % — ABNORMAL HIGH (ref 11.5–15.5)
WBC: 65.3 10*3/uL — AB (ref 4.0–10.5)

## 2018-01-27 LAB — PHOSPHORUS: Phosphorus: 3.9 mg/dL (ref 2.5–4.6)

## 2018-01-27 LAB — LACTATE DEHYDROGENASE: LDH: 130 U/L (ref 98–192)

## 2018-01-27 NOTE — Progress Notes (Unsigned)
CRITICAL VALUE ALERT Critical value received:  WBC 65.3 Date of notification:  01-27-18 Time of notification: 1200 Critical value read back:  Yes.   Nurse who received alert:  C. PageRN MD notified (1st page):  Dr. Walden Field

## 2018-01-28 ENCOUNTER — Encounter (HOSPITAL_COMMUNITY): Payer: Self-pay | Admitting: *Deleted

## 2018-01-28 DIAGNOSIS — D509 Iron deficiency anemia, unspecified: Secondary | ICD-10-CM | POA: Diagnosis not present

## 2018-01-28 DIAGNOSIS — E875 Hyperkalemia: Secondary | ICD-10-CM | POA: Diagnosis not present

## 2018-01-28 DIAGNOSIS — E1122 Type 2 diabetes mellitus with diabetic chronic kidney disease: Secondary | ICD-10-CM | POA: Diagnosis not present

## 2018-01-28 DIAGNOSIS — Z0001 Encounter for general adult medical examination with abnormal findings: Secondary | ICD-10-CM | POA: Diagnosis not present

## 2018-01-28 DIAGNOSIS — N189 Chronic kidney disease, unspecified: Secondary | ICD-10-CM | POA: Diagnosis not present

## 2018-01-28 NOTE — Progress Notes (Signed)
I called and spoke with patient. I advised that his urinalysis was much improved.  He denies any urinary symptoms.  I advised for him to call clinic should he have any concerns and he verbalizes understanding.

## 2018-01-29 LAB — URINE CULTURE

## 2018-01-30 ENCOUNTER — Other Ambulatory Visit (HOSPITAL_COMMUNITY): Payer: Self-pay | Admitting: *Deleted

## 2018-01-30 MED ORDER — IBRUTINIB 140 MG PO CAPS: 280.0000 mg | ORAL_CAPSULE | Freq: Every day | ORAL | 0 refills | Status: DC

## 2018-01-30 NOTE — Telephone Encounter (Signed)
Chart reviewed, Imbruvica refilled.

## 2018-02-03 ENCOUNTER — Ambulatory Visit (HOSPITAL_COMMUNITY): Payer: Medicare Other

## 2018-02-06 ENCOUNTER — Other Ambulatory Visit: Payer: Self-pay

## 2018-02-06 ENCOUNTER — Encounter (HOSPITAL_COMMUNITY): Payer: Self-pay | Admitting: Hematology

## 2018-02-06 ENCOUNTER — Inpatient Hospital Stay (HOSPITAL_BASED_OUTPATIENT_CLINIC_OR_DEPARTMENT_OTHER): Payer: Medicare Other | Admitting: Hematology

## 2018-02-06 DIAGNOSIS — Z85828 Personal history of other malignant neoplasm of skin: Secondary | ICD-10-CM

## 2018-02-06 DIAGNOSIS — Z79899 Other long term (current) drug therapy: Secondary | ICD-10-CM

## 2018-02-06 DIAGNOSIS — Z9221 Personal history of antineoplastic chemotherapy: Secondary | ICD-10-CM

## 2018-02-06 DIAGNOSIS — C9110 Chronic lymphocytic leukemia of B-cell type not having achieved remission: Secondary | ICD-10-CM

## 2018-02-06 NOTE — Patient Instructions (Signed)
[facility] Discharge Instructions     Thank you for choosing [facility] at [facility] to provide your oncology and hematology care.  To afford each patient quality time with our provider, please arrive at least 15 minutes before your scheduled appointment time.   If you have a lab appointment with the Cancer Center please come in thru the  Main Entrance and check in at the main information desk  You need to re-schedule your appointment should you arrive 10 or more minutes late.  We strive to give you quality time with our providers, and arriving late affects you and other patients whose appointments are after yours.  Also, if you no show three or more times for appointments you may be dismissed from the clinic at the providers discretion.     Again, thank you for choosing [facility].  Our hope is that these requests will decrease the amount of time that you wait before being seen by our physicians.       _____________________________________________________________  Should you have questions after your visit to  Cancer Center, please contact our office at [phone] between the hours of 8:00 a.m. and 4:30 p.m.  Voicemails left after 4:00 p.m. will not be returned until the following business day.  For prescription refill requests, have your pharmacy contact our office and allow 72 hours.    Cancer Center Support Programs:   > Cancer Support Group  2nd Tuesday of the month 1pm-2pm, Journey Room    

## 2018-02-06 NOTE — Progress Notes (Signed)
[place] [place], [place] [address]   CLINIC:  Medical Oncology/Hematology  PCP:  [name], MD [place] [address] [phone]   REASON FOR VISIT:  Follow-up for CLL  CURRENT THERAPY: Ibrutinib 280 MG daily  BRIEF ONCOLOGIC HISTORY:    CLL (chronic lymphocytic leukemia) ([facility])   02/14/2005 Procedure    Peripheral flow cytometry- CLL (without 11q or 17 p deletion) presenting with stage 0 disease    10/25/2008 Progression    Increasing WBC and symptoms suggestive of progressive disease    11/09/2008 - 01/07/2009 Chemotherapy    Rituxan, fludarabine, cytoxan x 3 cycles with the discontinuation of fludarabine and cytoxan due to severe pancytopenia requiring hospitalization.    01/10/2009 Adverse Reaction    Severe pancytopenia and adverse side effects    02/01/2009 - 04/01/2009 Chemotherapy    Rituxan every 21 days x 3 cycles    04/02/2009 Remission    Stable disease.  Chemotherapy holiday started    06/22/2013 - 06/29/2013 Chemotherapy    Imbruvica daily    06/29/2013 Adverse Reaction    Large right posterior leg (popliteal) ecchymosis/violaceous rash.  Likely Imbruvica-induced    07/31/2013 - 10/12/2013 Chemotherapy    Zydelig (idelalisib) 150 mg BID monotherapy    10/12/2013 Adverse Reaction    Transaminitis, suspected to be secondary to Lupus.  Dose decreased.    10/12/2013 - 11/09/2013 Chemotherapy    Zydelig (idelalisib) 150 mg daily.    11/09/2013 Adverse Reaction    Worsening transaminitis.  Zydelig on HOLD    11/09/2013 Treatment Plan Change    Hold Zydelig    12/28/2013 Adverse Reaction    Herpes Zoster outbreak.  Partial response with Famvir x 1 week, switched to acyclovir.  Progression over 1-2 weeks with development of keratoconjunctivitis bilaterally.  VZ Immune Globulin 625 mg IM given on 01/13/14.    02/08/2014 Treatment Plan Change    Ibrutinib restarted 140 mg daily for one week followed by 280 mg daily  followed by 420 mg daily with prophylactic acyclovir.    02/08/2014 - 07/05/2014 Chemotherapy    Ibrutinib 420 mg daily    1Mar 18, 202015 - 04/17/2014 Hospital Admission    Right lower lobe pneumonia    07/05/2014 Adverse Reaction    Fissuring of fingertips and sore, red feet with transaminitis    07/05/2014 Treatment Plan Change    Hold Ibrutinib    07/22/2014 - 08/19/2014 Chemotherapy    Ibrutinib restarted at 280 mg daily.    08/09/2014 - 08/12/2014 Hospital Admission    Bilateral pneumonia    08/19/2014 Adverse Reaction    Nausea with vomiting.  Escorted to ED.    08/19/2014 Treatment Plan Change    Ibrutinib on hold- ED escort from clinic     10/18/2014 Miscellaneous    B12 weekly x 4 then monthly.    12/15/2015 Imaging    Bone density- BMD as determined from Femur Neck Left is 0.803 g/cm2 with a T-score of -2.1. This patient is considered osteopenic by World Healh Organization (WHO) Criteria.    12/16/2015 Imaging    CT abd/pelvis-  Progression of retroperitoneal and portacaval adenopathy compared to the prior study. Likely related to CLL.  Small hiatal hernia  Mild compression fractures T11 and T12, probably old. Correlate with any pain in this area.    05/22/2016 - 05/28/2016 Chemotherapy    BR day 1 and 2 x 1 cycle.  Bendamustine dose reduced by 50%.  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    01/28/2017 -  Chemotherapy    Cycle 1 day 1 of Gayzva and Chlorambucil. Rasburicase 3 mg given also.     01/28/2017 - 02/08/2017 Hospital Admission    Admitted for sycopal episode due to complete heart block. Patient went into PEA arrest requiring CPR and intubation. 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 Pacemaker placed while inpatient. Started on  eliquis.      INTERVAL HISTORY:  Mr. Spivack 82 y.o. male returns for routine follow-up for chronic lymphocytic leukemia. Patient is here today with his wife. He is doing well. [name] He reports his appetite at 50% and he is drinking 2-3 ensure daily. He is maintaining his weight. His energy level is 25%. He denies any fevers, chill, or night sweats. Denies any new pains. Denies any nausea, vomiting, or dairrhea. He has a follow up appointment to see his surgeon to remove the melanoma on his head.    REVIEW OF SYSTEMS:  Review of Systems  Respiratory: Positive for cough and shortness of breath.   Neurological: Positive for dizziness.  All other systems reviewed and are negative.    PAST MEDICAL/SURGICAL HISTORY:  Past Medical History:  Diagnosis Date  . Anemia   . B12 deficiency 12/07/2014  . CLL (chronic lymphocytic leukemia) ([facility]) 01/24/2011  . Diabetes mellitus   . DM (diabetes mellitus) ([facility]) 01/24/2011  . Eczema 01/24/2011  . History of pneumonia 04/2014  . Hypertension   . Leukemia ([facility]) 6.22.2012   PER PATIENT  . Melanoma in situ ([facility]) 01/24/2011  . Mild obesity 01/24/2011  . Port catheter in place 05/02/2012  . Shingles   . Ulcer    Past Surgical History:  Procedure Laterality Date  . bleeding ulcer    . CATARACT EXTRACTION W/PHACO  05/05/2012   Procedure: CATARACT EXTRACTION PHACO AND INTRAOCULAR LENS PLACEMENT (IOC);  Surgeon: Tonny Branch, MD;  Location: [facility];  Service: Ophthalmology;  Laterality: Right;  CDE:13.25  . CATARACT EXTRACTION W/PHACO  05/15/2012   Procedure: CATARACT EXTRACTION PHACO AND INTRAOCULAR LENS PLACEMENT (IOC);  Surgeon: Tonny Branch, MD;  Location: [facility];  Service: Ophthalmology;  Laterality: Left;  CDE:  12.32  . COLONOSCOPY N/A 01/30/2016   Procedure: COLONOSCOPY;  Surgeon: Danie Binder, MD;  Location: [facility];  Service: Endoscopy;  Laterality: N/A;  2:00 PM - moved to 1:00 - office notified pt  . ESOPHAGOGASTRODUODENOSCOPY  2008   Dr.  Oneida Alar: normal esophagus, antral erythema, 1 cm clean based duodenal ulcera, negative H. plyori  . ESOPHAGOGASTRODUODENOSCOPY N/A 01/30/2016   Procedure: ESOPHAGOGASTRODUODENOSCOPY (EGD);  Surgeon: Danie Binder, MD;  Location: [facility];  Service: Endoscopy;  Laterality: N/A;  . HERNIA REPAIR  2001  . PACEMAKER IMPLANT N/A 02/07/2017   Procedure: PACEMAKER IMPLANT;  Surgeon: Constance Haw, MD;  Location: [facility];  Service: Cardiovascular;  Laterality: N/A;  . PORTACATH PLACEMENT  2008     SOCIAL HISTORY:  Social History   Socioeconomic History  . Marital status: Married    Spouse name: Not on file  . Number of children: Not on file  . Years of education: Not on file  . Highest education level: Not on file  Occupational History  . Not on file  Social Needs  .  Financial resource strain: Not on file  . Food insecurity:    Worry: Not on file    Inability: Not on file  . Transportation needs:    Medical: Not on file    Non-medical: Not on file  Tobacco Use  . Smoking status: Former Smoker    Last attempt to quit: 09/11/1977    Years since quitting: 40.4  . Smokeless tobacco: Former Systems developer    Quit date: 04/25/1967  Substance and Sexual Activity  . Alcohol use: No  . Drug use: No  . Sexual activity: Not on file  Lifestyle  . Physical activity:    Days per week: Not on file    Minutes per session: Not on file  . Stress: Not on file  Relationships  . Social connections:    Talks on phone: Not on file    Gets together: Not on file    Attends religious service: Not on file    Active member of club or organization: Not on file    Attends meetings of clubs or organizations: Not on file    Relationship status: Not on file  . Intimate partner violence:    Fear of current or ex partner: Not on file    Emotionally abused: Not on file    Physically abused: Not on file    Forced sexual activity: Not on file  Other Topics Concern  . Not on file  Social History  Narrative  . Not on file    FAMILY HISTORY:  Family History  Problem Relation Age of Onset  . Diabetes Father   . Colon cancer Neg Hx     CURRENT MEDICATIONS:  Outpatient Encounter Medications as of 02/06/2018  Medication Sig  . ACCU-CHEK AVIVA PLUS test strip   . ACCU-CHEK SOFTCLIX LANCETS lancets   . allopurinol (ZYLOPRIM) 100 MG tablet Take 1 tablet (100 mg total) by mouth daily.  . cholecalciferol (VITAMIN D) 400 units TABS tablet Take 400 Units by mouth daily.  [name] docusate sodium (COLACE) 100 MG capsule Take 100 mg by mouth 2 (two) times daily.  . feeding supplement, ENSURE ENLIVE, (ENSURE ENLIVE) LIQD Take 237 mLs by mouth 2 (two) times daily between meals.  . ibrutinib (IMBRUVICA) 140 MG capsul Take 2 capsules (280 mg total) by mouth daily.  [name] levETIRAcetam (KEPPRA) 100 MG/ML solution Take 7.5 mLs (750 mg total) by mouth 2 (two) times daily.  . rosuvastatin (CRESTOR) 40 MG tablet Take 40 mg by mouth at bedtime.   . sitaGLIPtin (JANUVIA) 100 MG tablet Take 100 mg by mouth daily.  . sodium bicarbonate 650 MG tablet TAKE 1 TABLET BY MOUTH TWICE A DAY AS DIRECTED  . sodium polystyrene (KAYEXALATE) powder   . vitamin C (ASCORBIC ACID) 500 MG tablet Take 500 mg by mouth daily.   Facility-Administered Encounter Medications as of 02/06/2018  Medication  . heparin lock flush 100 unit/mL  . sodium chloride flush (NS) 0.9 % injection 10 mL  . sodium polystyrene (KAYEXALATE) 15 GM/60ML suspension 30 g    ALLERGIES:  Allergies  Allergen Reactions  . Aspirin Other (See Comments)    Bleeding ulcers.   . Ibuprofen Other (See Comments)    Bleeding ulcers.     PHYSICAL EXAM:  ECOG Performance status: 1  Vitals:   02/06/18 1216  BP: 117/69  Pulse: 74  Resp: 16  Temp: 98.2 F (36.8 C)  SpO2: 98%   Filed Weights   02/06/18 1216  Weight: 125 lb 8 oz (  56.9 kg)    Physical Exam  Constitutional: He is oriented to person, place, and time. He appears well-developed and  well-nourished.  Musculoskeletal: Normal range of motion.  Neurological: He is alert and oriented to person, place, and time.  Skin: Skin is warm and dry.  Psychiatric: He has a normal mood and affect. His behavior is normal. Judgment and thought content normal.     LABORATORY DATA:  I have reviewed the labs as listed.  CBC    Component Value Date/Time   WBC 65.3 (HH) 01/27/2018 1021   RBC 2.44 (L) 01/27/2018 1021   HGB 8.7 (L) 01/27/2018 1021   HCT 28.5 (L) 01/27/2018 1021   PLT 126 (L) 01/27/2018 1021   MCV 116.8 (H) 01/27/2018 1021   MCH 35.7 (H) 01/27/2018 1021   MCHC 30.5 01/27/2018 1021   RDW 19.3 (H) 01/27/2018 1021   LYMPHSABS 61.9 (H) 01/27/2018 1021   MONOABS 0.7 01/27/2018 1021   EOSABS 0.7 01/27/2018 1021   BASOSABS 0.0 01/27/2018 1021   CMP Latest Ref Rng & Units 01/27/2018 01/16/2018 01/03/2018  Glucose 70 - 99 mg/dL 113(H) 156(H) 196(H)  BUN 8 - 23 mg/dL 30(H) 38(H) 27(H)  Creatinine 0.61 - 1.24 mg/dL 1.76(H) 2.02(H) 1.82(H)  Sodium 135 - 145 mmol/L 140 137 140  Potassium 3.5 - 5.1 mmol/L 4.6 4.9 4.4  Chloride 98 - 111 mmol/L 106 104 107  CO2 22 - 32 mmol/L 29 25 25   Calcium 8.9 - 10.3 mg/dL 8.1(L) 8.5(L) 8.3(L)  Total Protein 6.5 - 8.1 g/dL 7.0 7.4 7.3  Total Bilirubin 0.3 - 1.2 mg/dL 0.6 0.6 0.4  Alkaline Phos 38 - 126 U/L 75 121 189(H)  AST 15 - 41 U/L 48(H) 30 35  ALT 0 - 44 U/L 39 28 65(H)          ASSESSMENT & PLAN:   CLL (chronic lymphocytic leukemia) ([facility]) 1. Stage IV CLL with history of poor tolerance to past chemotherapies: -Could not tolerate past treatments including FCR in 2010, Imbruvica 2015 (located by large leg rash), Zydelig 9767 (complicated by transaminitis despite dose reduction), Imbruvica rechallenge in 3419 (complicated by infection and progressive fatigue), bendamustine/Rituxan 1 cycle in 3790 (complicated by prolonged and significant cytopenia in addition to tumor lysis syndrome and renal failure).  He could not even tolerate  last treatment with obinutuzumab and chlorambucil. -He reports recent onset night sweats, tiredness on exertion and 15 pound weight loss in the last 1 month. -Ibrutinib 140 mg daily started on 11/22/2017 after TLS prophylaxis with rasburicase.  He was hospitalized for the next 2 days for observation with continuous IV fluids and lab monitoring.  He did not develop any tumor lysis syndrome.  He received 1 unit of blood transfusion yesterday prior to discharge. - Ibrutinib was increased to 2 tablets daily on 11/29/2017.  He has noticed easy bruising.  His tumor lysis syndrome labs were within normal limits. - He is continuing to get a good response with downtrending white cell count.  He received 1 unit of blood transfusion on 12/13/2017. - He is continuing ibrutinib at this time. - Needle biopsy by Dr. Janace Hoard of the left parotid mass shows squamous cell carcinoma on 01/09/2018 - PET/CT scan on 01/10/2018 showed 3 hypermetabolic lesions associated with left parotid gland, largest bulges the skin surface.  Additional necrotic level 2 lymph node.  -The PET scan also showed mildly enlarged axillary, mediastinal, periaortic lymph nodes from his CLL.  There is also focal hypermetabolic activity along the  gastric fundus and distal esophagus.  This is likely gastritis.  He had a history of peptic ulcer. - He will have wide excision of the left temporal lesion as well as lymph node dissection.  He was advised to stop ibrutinib 5 to 7 days prior to surgery.  2.  Left temporal squamous cell carcinoma: -Biopsy of the left forehead on 03/21/2017 was consistent with moderately differentiated squamous cell carcinoma.  Fine-needle aspiration of the parotid gland on 01/09/2018 was consistent with squamous cell carcinoma.  He follows up with Dr. Janace Hoard for extensive resection. -Please note that my previous notes mention about malignant melanoma.  This was wrong diagnosis.  I have reviewed pathology reports today which were  consistent with squamous cell carcinoma.  3. TLS prevention: - He received rasburicase and was hospitalized for 2 days with continuous lab monitoring and IV fluids.  He did not develop any tumor lysis syndrome.  He is currently taking allopurinol daily.           Orders placed this encounter:  Orders Placed This Encounter  Procedures  . Magnesium  . CBC with Differential/Platelet  . Comprehensive metabolic panel  . Lactate dehydrogenase  . Uric acid  . Phosphorus      Derek Jack, MD [place] [phone]

## 2018-02-06 NOTE — Assessment & Plan Note (Signed)
1. Stage IV CLL with history of poor tolerance to past chemotherapies: -Could not tolerate past treatments including FCR in 2010, Imbruvica 2015 (located by large leg rash), Zydelig 6962 (complicated by transaminitis despite dose reduction), Imbruvica rechallenge in 9528 (complicated by infection and progressive fatigue), bendamustine/Rituxan 1 cycle in 4132 (complicated by prolonged and significant cytopenia in addition to tumor lysis syndrome and renal failure).  He could not even tolerate last treatment with obinutuzumab and chlorambucil. -He reports recent onset night sweats, tiredness on exertion and 15 pound weight loss in the last 1 month. -Ibrutinib 140 mg daily started on 11/22/2017 after TLS prophylaxis with rasburicase.  He was hospitalized for the next 2 days for observation with continuous IV fluids and lab monitoring.  He did not develop any tumor lysis syndrome.  He received 1 unit of blood transfusion yesterday prior to discharge. - Ibrutinib was increased to 2 tablets daily on 11/29/2017.  He has noticed easy bruising.  His tumor lysis syndrome labs were within normal limits. - He is continuing to get a good response with downtrending white cell count.  He received 1 unit of blood transfusion on 12/13/2017. - He is continuing ibrutinib at this time. - Needle biopsy by Dr. Janace Hoard of the left parotid mass shows squamous cell carcinoma on 01/09/2018 - PET/CT scan on 01/10/2018 showed 3 hypermetabolic lesions associated with left parotid gland, largest bulges the skin surface.  Additional necrotic level 2 lymph node.  -The PET scan also showed mildly enlarged axillary, mediastinal, periaortic lymph nodes from his CLL.  There is also focal hypermetabolic activity along the gastric fundus and distal esophagus.  This is likely gastritis.  He had a history of peptic ulcer. - He will have wide excision of the left temporal lesion as well as lymph node dissection.  He was advised to stop ibrutinib 5 to 7  days prior to surgery.  2.  Left temporal squamous cell carcinoma: -Biopsy of the left forehead on 03/21/2017 was consistent with moderately differentiated squamous cell carcinoma.  Fine-needle aspiration of the parotid gland on 01/09/2018 was consistent with squamous cell carcinoma.  He follows up with Dr. Janace Hoard for extensive resection. -Please note that my previous notes mention about malignant melanoma.  This was wrong diagnosis.  I have reviewed pathology reports today which were consistent with squamous cell carcinoma.  3. TLS prevention: - He received rasburicase and was hospitalized for 2 days with continuous lab monitoring and IV fluids.  He did not develop any tumor lysis syndrome.  He is currently taking allopurinol daily.

## 2018-02-07 ENCOUNTER — Ambulatory Visit (HOSPITAL_COMMUNITY): Payer: Medicare Other | Admitting: Internal Medicine

## 2018-02-13 ENCOUNTER — Other Ambulatory Visit: Payer: Self-pay | Admitting: Otolaryngology

## 2018-02-14 ENCOUNTER — Encounter (HOSPITAL_COMMUNITY): Payer: Medicare Other

## 2018-02-14 ENCOUNTER — Telehealth (HOSPITAL_COMMUNITY): Payer: Self-pay

## 2018-02-14 ENCOUNTER — Other Ambulatory Visit (HOSPITAL_COMMUNITY): Payer: Medicare Other

## 2018-02-14 ENCOUNTER — Ambulatory Visit (HOSPITAL_COMMUNITY): Payer: Medicare Other | Admitting: Hematology

## 2018-02-14 NOTE — Telephone Encounter (Signed)
Nutrition  Patient was scheduled for nutrition follow-up appointment today but did not show.  Called and spoke with wife. Patient is eating well per wife and has gained more weight.  Planning surgery on 10/28.    Wife agreeable to RD speaking with patient on Tuesday, October 15 when patient is here for labs and to see MD.   Will schedule for patient.   Jolyn Deshmukh B. Zenia Resides, [place], [place] Registered Dietitian [phone] (pager)

## 2018-02-18 ENCOUNTER — Ambulatory Visit (INDEPENDENT_AMBULATORY_CARE_PROVIDER_SITE_OTHER): Payer: Medicare Other | Admitting: *Deleted

## 2018-02-18 ENCOUNTER — Telehealth: Payer: Self-pay | Admitting: Cardiology

## 2018-02-18 DIAGNOSIS — R55 Syncope and collapse: Secondary | ICD-10-CM | POA: Diagnosis not present

## 2018-02-18 DIAGNOSIS — R001 Bradycardia, unspecified: Secondary | ICD-10-CM

## 2018-02-18 NOTE — Telephone Encounter (Signed)
Confirmed remote transmission w/ pt wife.   

## 2018-02-19 NOTE — Progress Notes (Signed)
Remote pacemaker transmission.   

## 2018-02-24 DIAGNOSIS — L989 Disorder of the skin and subcutaneous tissue, unspecified: Secondary | ICD-10-CM | POA: Diagnosis not present

## 2018-02-24 DIAGNOSIS — C4442 Squamous cell carcinoma of skin of scalp and neck: Secondary | ICD-10-CM | POA: Diagnosis not present

## 2018-02-24 DIAGNOSIS — C44399 Other specified malignant neoplasm of skin of other parts of face: Secondary | ICD-10-CM | POA: Diagnosis not present

## 2018-02-25 ENCOUNTER — Other Ambulatory Visit: Payer: Self-pay | Admitting: Pharmacist

## 2018-02-25 ENCOUNTER — Other Ambulatory Visit: Payer: Self-pay

## 2018-02-25 ENCOUNTER — Inpatient Hospital Stay (HOSPITAL_BASED_OUTPATIENT_CLINIC_OR_DEPARTMENT_OTHER): Payer: Medicare Other | Admitting: Hematology

## 2018-02-25 ENCOUNTER — Inpatient Hospital Stay (HOSPITAL_COMMUNITY): Payer: Medicare Other | Admitting: Dietician

## 2018-02-25 ENCOUNTER — Inpatient Hospital Stay (HOSPITAL_COMMUNITY): Payer: Medicare Other

## 2018-02-25 ENCOUNTER — Inpatient Hospital Stay (HOSPITAL_COMMUNITY): Payer: Medicare Other | Attending: Hematology

## 2018-02-25 ENCOUNTER — Encounter (HOSPITAL_COMMUNITY): Payer: Self-pay | Admitting: Hematology

## 2018-02-25 VITALS — BP 121/76 | HR 80 | Temp 97.5°F | Resp 16 | Wt 124.5 lb

## 2018-02-25 DIAGNOSIS — C9110 Chronic lymphocytic leukemia of B-cell type not having achieved remission: Secondary | ICD-10-CM | POA: Diagnosis not present

## 2018-02-25 DIAGNOSIS — Z87891 Personal history of nicotine dependence: Secondary | ICD-10-CM | POA: Diagnosis not present

## 2018-02-25 DIAGNOSIS — Z85820 Personal history of malignant melanoma of skin: Secondary | ICD-10-CM | POA: Insufficient documentation

## 2018-02-25 DIAGNOSIS — C07 Malignant neoplasm of parotid gland: Secondary | ICD-10-CM

## 2018-02-25 DIAGNOSIS — Z9221 Personal history of antineoplastic chemotherapy: Secondary | ICD-10-CM | POA: Insufficient documentation

## 2018-02-25 DIAGNOSIS — R591 Generalized enlarged lymph nodes: Secondary | ICD-10-CM

## 2018-02-25 DIAGNOSIS — Z79899 Other long term (current) drug therapy: Secondary | ICD-10-CM | POA: Diagnosis not present

## 2018-02-25 DIAGNOSIS — M8580 Other specified disorders of bone density and structure, unspecified site: Secondary | ICD-10-CM | POA: Insufficient documentation

## 2018-02-25 DIAGNOSIS — E538 Deficiency of other specified B group vitamins: Secondary | ICD-10-CM

## 2018-02-25 LAB — CBC WITH DIFFERENTIAL/PLATELET
Abs Immature Granulocytes: 0.07 10*3/uL (ref 0.00–0.07)
Basophils Absolute: 0 10*3/uL (ref 0.0–0.1)
Basophils Relative: 0 %
EOS ABS: 0.1 10*3/uL (ref 0.0–0.5)
EOS PCT: 0 %
HCT: 33.4 % — ABNORMAL LOW (ref 39.0–52.0)
HEMOGLOBIN: 10.1 g/dL — AB (ref 13.0–17.0)
IMMATURE GRANULOCYTES: 0 %
LYMPHS ABS: 61.1 10*3/uL — AB (ref 0.7–4.0)
Lymphocytes Relative: 97 %
MCH: 36.5 pg — ABNORMAL HIGH (ref 26.0–34.0)
MCHC: 30.2 g/dL (ref 30.0–36.0)
MCV: 120.6 fL — AB (ref 80.0–100.0)
Monocytes Absolute: 0.5 10*3/uL (ref 0.1–1.0)
Monocytes Relative: 1 %
Neutro Abs: 1.4 10*3/uL — ABNORMAL LOW (ref 1.7–7.7)
Neutrophils Relative %: 2 %
PLATELETS: 107 10*3/uL — AB (ref 150–400)
RBC: 2.77 MIL/uL — ABNORMAL LOW (ref 4.22–5.81)
RDW: 15.6 % — ABNORMAL HIGH (ref 11.5–15.5)
Smear Review: DECREASED
WBC: 63.3 10*3/uL (ref 4.0–10.5)
nRBC: 0 % (ref 0.0–0.2)

## 2018-02-25 LAB — COMPREHENSIVE METABOLIC PANEL
ALK PHOS: 60 U/L (ref 38–126)
ALT: 27 U/L (ref 0–44)
AST: 29 U/L (ref 15–41)
Albumin: 3.8 g/dL (ref 3.5–5.0)
Anion gap: 6 (ref 5–15)
BUN: 31 mg/dL — ABNORMAL HIGH (ref 8–23)
CHLORIDE: 104 mmol/L (ref 98–111)
CO2: 28 mmol/L (ref 22–32)
Calcium: 8.7 mg/dL — ABNORMAL LOW (ref 8.9–10.3)
Creatinine, Ser: 1.75 mg/dL — ABNORMAL HIGH (ref 0.61–1.24)
GFR calc Af Amer: 41 mL/min — ABNORMAL LOW (ref >60)
GFR, EST NON AFRICAN AMERICAN: 35 mL/min — AB (ref >60)
Glucose, Bld: 157 mg/dL — ABNORMAL HIGH (ref 70–99)
POTASSIUM: 4.3 mmol/L (ref 3.5–5.1)
SODIUM: 138 mmol/L (ref 135–145)
Total Bilirubin: 0.7 mg/dL (ref 0.3–1.2)
Total Protein: 7.6 g/dL (ref 6.5–8.1)

## 2018-02-25 LAB — LACTATE DEHYDROGENASE: LDH: 136 U/L (ref 98–192)

## 2018-02-25 LAB — URIC ACID: Uric Acid, Serum: 3.8 mg/dL (ref 3.7–8.6)

## 2018-02-25 LAB — PHOSPHORUS: Phosphorus: 3.5 mg/dL (ref 2.5–4.6)

## 2018-02-25 MED ORDER — CYANOCOBALAMIN 1000 MCG/ML IJ SOLN
1000.0000 ug | Freq: Once | INTRAMUSCULAR | Status: AC
  Administered 2018-02-25: 1000 ug via INTRAMUSCULAR

## 2018-02-25 MED ORDER — CYANOCOBALAMIN 1000 MCG/ML IJ SOLN
INTRAMUSCULAR | Status: AC
  Filled 2018-02-25: qty 1

## 2018-02-25 NOTE — Assessment & Plan Note (Signed)
1. Stage IV CLL with history of poor tolerance to past chemotherapies: -Could not tolerate past treatments including FCR in 2010, Imbruvica 2015 (located by large leg rash), Zydelig 7494 (complicated by transaminitis despite dose reduction), Imbruvica rechallenge in 4967 (complicated by infection and progressive fatigue), bendamustine/Rituxan 1 cycle in 5916 (complicated by prolonged and significant cytopenia in addition to tumor lysis syndrome and renal failure).  He could not even tolerate last treatment with obinutuzumab and chlorambucil. -He reports recent onset night sweats, tiredness on exertion and 15 pound weight loss in the last 1 month. -Ibrutinib 140 mg daily started on 11/22/2017 after TLS prophylaxis with rasburicase.  He was hospitalized for the next 2 days for observation with continuous IV fluids and lab monitoring.  He did not develop any tumor lysis syndrome.  He received 1 unit of blood transfusion yesterday prior to discharge. - Ibrutinib was increased to 2 tablets daily on 11/29/2017.  He has noticed easy bruising.  His tumor lysis syndrome labs were within normal limits. - He is continuing to get a good response with downtrending white cell count.  He received 1 unit of blood transfusion on 12/13/2017. - He is continuing ibrutinib at this time. - Needle biopsy by Dr. Janace Hoard of the left parotid mass shows squamous cell carcinoma on 01/09/2018 - PET/CT scan on 01/10/2018 showed 3 hypermetabolic lesions associated with left parotid gland, largest bulges the skin surface.  Additional necrotic level 2 lymph node.  -The PET scan also showed mildly enlarged axillary, mediastinal, periaortic lymph nodes from his CLL.  There is also focal hypermetabolic activity along the gastric fundus and distal esophagus.  This is likely gastritis.  He had a history of peptic ulcer. - He is scheduled for wide excision and lymph node dissection on 03/10/2018.  I have told him to hold off taking ibrutinib 1 week  prior to planned surgery. -We reviewed blood counts today white count is 63.3.  Hemoglobin is 10.1 and platelet count is 107.  LDH was 136. -I will see him back in 3 weeks for follow-up.  2.  Left temporal squamous cell carcinoma: -Biopsy of the left forehead on 03/21/2017 was consistent with moderately differentiated squamous cell carcinoma.  Fine-needle aspiration of the parotid gland on 01/09/2018 was consistent with squamous cell carcinoma.  He follows up with Dr. Janace Hoard for extensive resection. -He is scheduled for wide local excision by Dr. Janace Hoard on 03/10/2018.  3. TLS prevention: - He received rasburicase and was hospitalized for 2 days with continuous lab monitoring and IV fluids.  He did not develop any tumor lysis syndrome.  He is currently taking allopurinol daily.

## 2018-02-25 NOTE — Patient Instructions (Signed)
[facility] at [facility] Discharge Instructions  Follow up in 3 weeks with labs   Thank you for choosing [facility] at [facility] to provide your oncology and hematology care.  To afford each patient quality time with our provider, please arrive at least 15 minutes before your scheduled appointment time.   If you have a lab appointment with the Cancer Center please come in thru the  Main Entrance and check in at the main information desk  You need to re-schedule your appointment should you arrive 10 or more minutes late.  We strive to give you quality time with our providers, and arriving late affects you and other patients whose appointments are after yours.  Also, if you no show three or more times for appointments you may be dismissed from the clinic at the providers discretion.     Again, thank you for choosing [facility].  Our hope is that these requests will decrease the amount of time that you wait before being seen by our physicians.       _____________________________________________________________  Should you have questions after your visit to [facility], please contact our office at [phone] between the hours of 8:00 a.m. and 4:30 p.m.  Voicemails left after 4:00 p.m. will not be returned until the following business day.  For prescription refill requests, have your pharmacy contact our office and allow 72 hours.    Cancer Center Support Programs:   > Cancer Support Group  2nd Tuesday of the month 1pm-2pm, Journey Room    

## 2018-02-25 NOTE — Patient Instructions (Signed)
[facility] at [facility]  Discharge Instructions:   _______________________________________________________________  Thank you for choosing [facility] at [facility] to provide your oncology and hematology care.  To afford each patient quality time with our providers, please arrive at least 15 minutes before your scheduled appointment.  You need to re-schedule your appointment if you arrive 10 or more minutes late.  We strive to give you quality time with our providers, and arriving late affects you and other patients whose appointments are after yours.  Also, if you no show three or more times for appointments you may be dismissed from the clinic.  Again, thank you for choosing [facility] at [facility]. Our hope is that these requests will allow you access to exceptional care and in a timely manner. _______________________________________________________________  If you have questions after your visit, please contact our office at [phone] between the hours of 8:30 a.m. and 5:00 p.m. Voicemails left after 4:30 p.m. will not be returned until the following business day. _______________________________________________________________  For prescription refill requests, have your pharmacy contact our office. _______________________________________________________________  Recommendations made by the consultant and any test results will be sent to your referring physician. _______________________________________________________________ 

## 2018-02-25 NOTE — Progress Notes (Signed)
Nutrition Follow-up:  ASSESSMENT: 82 y/o male PMHx CKD3, Heart block s/p pacemaker, DM2, HLD and recent facial cellulitis w/ abscess. Stage 4 CLL. 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 Per PET 8/30, Melanoma has spread to regional lymph nodes-3 hypermetabolic lesions to parotid gland and surface of face. Scheduled for parotidectomy on 10/28  Following up with patient who had been serially followed by other RD.  Today pt shows weight of 124.5 lbs. This is about the same weight as 3 weeks ago when he was 125.5lbs. He is +10 lbs x3 months (was ~115 in July).  Long term, he was 132 lbs at this time last year and he is down a net of 6 lbs x 6 months.   Pt defers to spouse and Spouse provides most of information. Per spouse, the patient has been eating much better in the past few months. He is eating "3 meals with snacks in between". She reports he can not tolerate Ensure. This causes him to have diarrhea.   Went through diet recall:  Breakfast: oatmeal vs pancakes vs eggs/bacon. Always has apple juice too Lunch: Almost always has a legume (blackeyed peas or pintos). Yesterday also had coleslaw and cheese dog which he ate all of Dinner: Again, eats legumes and vegetables with applesauce.  Snacks: PB crackers, cheese ("he eats 2 slices cheese per day") Beverages-mostly water (6-8 glass/day), + couple glasses juice, couple sips of soda and sometimes coffee.   Patient denies any n/v/c/d. He has improved energy. Patient largely has no complaints. He eats crackers and drinks soda while spouse and I talk.   Wt Readings from Last 10 Encounters:  02/25/18 124 lb 8 oz (56.5 kg)  02/06/18 125 lb 8 oz (56.9 kg)  01/16/18 120 lb 4.8 oz (54.6 kg)  01/03/18 118 lb 8 oz (53.8 kg)  12/25/17 120 lb 6.4 oz (54.6 kg)  12/16/17 116 lb 4.8 oz (52.8 kg)  12/13/17 117 lb 9.6 oz (53.3 kg)  12/10/17 117 lb 6.4 oz (53.3 kg)  12/05/17 119 lb 12.8 oz (54.3 kg)  12/04/17 119 lb  6.4 oz (54.2 kg)   Medications: Tx: Ibrutinib,   Other: Vit C, Vit D, Colace, Januvia, kayexalate, Sodium bicarb, keppra,  Labs: Phos: 3.5, Albumin: 3.8 (from 3.4 3 weeks ago), BUN/Creat:31/1.75-stable x 4 weeks. BGs: 157 (says they are 110-120 at home usually)  Recent Labs  Lab 02/25/18 0943  NA 138  K 4.3  CL 104  CO2 28  BUN 31*  CREATININE 1.75*  CALCIUM 8.7*  PHOS 3.5  GLUCOSE 157*   Anthropometrics:  Height: 5\' 1"  (154.94 cm) Weight: 124.5 lbs (56.6 kg) Wt change: See assessment section BMI:  23.6 (healthy wt for ht)  Estimated Energy Needs Kcals: 1700-1900 kcals (30-34 kcal/kg bw) Protein: 80-90g Pro (1.4-1.6 g/kg ibw) Fluid: 1.7-1.9 L fluid (39ml/kcal)  NUTRITION DIAGNOSIS:  Increased protein/kcal needs related to MULTIPLE malignancies, other underlying chronic diseases and for pre-op nutrition as evidenced by the cumulative estimated needs for these conditions/circumstances  MALNUTRITION DIAGNOSIS:  n/a  INTERVENTION: Pt apparently gets diarrhea with Ensure. RD asked if they had ever tried Ensure clear or Boost Breeze. They hadnt. RD offered to give them samples, but they declined-"he has a great appetite...lets let it ride for now".   RD did review importance of adequate protein/kcal intake for upcoming surgery. Pt does not sound to eat enough protein. We specifically touched on high protein foods. RD advocated for drinking milk over the large  amount of water he is drinking (phos is WDL).   RD also noted during conversation that spouse sounds to be concerned about pt eating higher carb items due to hx of DM. RD reiterated that his BGs are not high enough to be a concern at this time. His primary concern should be eating and maintaining weight.   Reviewed importance of adding topping and condiments to foods to increase caloric density/protein content.   RD provided handout titled "increasing calories and protein". Reviewed/highlighted some more relevant food  items on the handout. Wrote "tips" on top of handout: eat every few hours, drink milk instead of water, and add condiments to foods   Education was hindered by spouse frequently interrupting RD or talking about unrelated topics.   MONITORING, EVALUATION, GOAL:  Weight, po intake, clinical course  NEXT VISIT: As needed  Burtis Junes RD, LDN, CNSC Clinical Nutrition Available Tues-Sat via Pager: [phone] 02/25/2018 1:00 PM

## 2018-02-25 NOTE — Progress Notes (Signed)
Pt seen today by Dr. Delton Coombes. VO received to give B12 injection prior to discharge. Pt tolerated well. Ambulated off the unit. F/u as scheduled.

## 2018-02-25 NOTE — Progress Notes (Signed)
Randall Reyes, [place] [id number]   CLINIC:  Medical Oncology/Hematology  PCP:  Randall Squibb, MD [place] [address] [phone]   REASON FOR VISIT: Follow-up for Chronic lymphocytic leukemia  CURRENT THERAPY: Ibrutinib 280 mg daily  BRIEF ONCOLOGIC HISTORY:    CLL (chronic lymphocytic leukemia) ([facility])   02/14/2005 Procedure    Peripheral flow cytometry- CLL (without 11q or 17 p deletion) presenting with stage 0 disease    10/25/2008 Progression    Increasing WBC and symptoms suggestive of progressive disease    11/09/2008 - 01/07/2009 Chemotherapy    Rituxan, fludarabine, cytoxan x 3 cycles with the discontinuation of fludarabine and cytoxan due to severe pancytopenia requiring hospitalization.    01/10/2009 Adverse Reaction    Severe pancytopenia and adverse side effects    02/01/2009 - 04/01/2009 Chemotherapy    Rituxan every 21 days x 3 cycles    04/02/2009 Remission    Stable disease.  Chemotherapy holiday started    06/22/2013 - 06/29/2013 Chemotherapy    Imbruvica daily    06/29/2013 Adverse Reaction    Large right posterior leg (popliteal) ecchymosis/violaceous rash.  Likely Imbruvica-induced    07/31/2013 - 10/12/2013 Chemotherapy    Zydelig (idelalisib) 150 mg BID monotherapy    10/12/2013 Adverse Reaction    Transaminitis, suspected to be secondary to [place].  Dose decreased.    10/12/2013 - 11/09/2013 Chemotherapy    Zydelig (idelalisib) 150 mg daily.    11/09/2013 Adverse Reaction    Worsening transaminitis.  Zydelig on HOLD    11/09/2013 Treatment Plan Change    Hold Zydelig    12/28/2013 Adverse Reaction    Herpes Zoster outbreak.  Partial response with Famvir x 1 week, switched to acyclovir.  Progression over 1-2 weeks with development of keratoconjunctivitis bilaterally.  VZ Immune Globulin 625 mg IM given on 01/13/14.    02/08/2014 Treatment Plan Change    Ibrutinib restarted 140 mg daily for one week  followed by 280 mg daily followed by 420 mg daily with prophylactic acyclovir.    02/08/2014 - 07/05/2014 Chemotherapy    Ibrutinib 420 mg daily    102/05/2013 - 04/17/2014 Hospital Admission    Right lower lobe pneumonia    07/05/2014 Adverse Reaction    Fissuring of fingertips and sore, red feet with transaminitis    07/05/2014 Treatment Plan Change    Hold Ibrutinib    07/22/2014 - 08/19/2014 Chemotherapy    Ibrutinib restarted at 280 mg daily.    08/09/2014 - 08/12/2014 Hospital Admission    Bilateral pneumonia    08/19/2014 Adverse Reaction    Nausea with vomiting.  Escorted to ED.    08/19/2014 Treatment Plan Change    Ibrutinib on hold- ED escort from clinic     10/18/2014 Miscellaneous    B12 weekly x 4 then monthly.    12/15/2015 Imaging    Bone density- BMD as determined from Femur Neck Left is 0.803 g/cm2 with a T-score of -2.1. This patient is considered osteopenic by World Healh Organization (WHO) Criteria.    12/16/2015 Imaging    CT abd/pelvis-  Progression of retroperitoneal and portacaval adenopathy compared to the prior study. Likely related to CLL.  Small hiatal hernia  Mild compression fractures T11 and T12, probably old. Correlate with any pain in this area.    05/22/2016 - 05/28/2016 Chemotherapy    BR day 1 and 2 x 1 cycle.  Bendamustine dose reduced by  50%.    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    01/28/2017 -  Chemotherapy    Cycle 1 day 1 of Gayzva and Chlorambucil. Rasburicase 3 mg given also.     01/28/2017 - 02/08/2017 Hospital Admission    Admitted for sycopal episode due to complete heart block. Patient went into PEA arrest requiring CPR and intubation. 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 Pacemaker placed  while inpatient. Started on eliquis.      INTERVAL HISTORY:  Randall Reyes 82 y.o. male returns for routine follow-up for chronic lymphocytic leukemia. Patient is here today with his wife. He is doing well he will be having his surgery on 03/10/18. He will stop taking the Ibrutinib on 03/03/18. He has no new complaints at this time. He reports his appetite and energy level at 50% and he is having no problems maintaining his weight. He also drinks ensure daily. He denies any bleeding. Denies any new pains. Denies any nausea, vomiting, or diarrhea. Denies any skin rashes.    REVIEW OF SYSTEMS:  Review of Systems  Hematological: Bruises/bleeds easily.  All other systems reviewed and are negative.    PAST MEDICAL/SURGICAL HISTORY:  Past Medical History:  Diagnosis Date  . Anemia   . B12 deficiency 12/07/2014  . CLL (chronic lymphocytic leukemia) ([facility]) 01/24/2011  . Diabetes mellitus   . DM (diabetes mellitus) ([facility]) 01/24/2011  . Eczema 01/24/2011  . History of pneumonia 04/2014  . Hypertension   . Leukemia ([facility]) 6.22.2012   PER PATIENT  . Melanoma in situ ([facility]) 01/24/2011  . Mild obesity 01/24/2011  . Port catheter in place 05/02/2012  . Shingles   . Ulcer    Past Surgical History:  Procedure Laterality Date  . bleeding ulcer    . CATARACT EXTRACTION W/PHACO  05/05/2012   Procedure: CATARACT EXTRACTION PHACO AND INTRAOCULAR LENS PLACEMENT (IOC);  Surgeon: Randall Branch, MD;  Location: [facility];  Service: Ophthalmology;  Laterality: Right;  CDE:13.25  . CATARACT EXTRACTION W/PHACO  05/15/2012   Procedure: CATARACT EXTRACTION PHACO AND INTRAOCULAR LENS PLACEMENT (IOC);  Surgeon: Randall Branch, MD;  Location: [facility];  Service: Ophthalmology;  Laterality: Left;  CDE:  12.32  . COLONOSCOPY N/A 01/30/2016   Procedure: COLONOSCOPY;  Surgeon: Randall Binder, MD;  Location: [facility];  Service: Endoscopy;  Laterality: N/A;  2:00 PM - moved to 1:00 - office notified pt  . ESOPHAGOGASTRODUODENOSCOPY   2008   Dr. Oneida Reyes: normal esophagus, antral erythema, 1 cm clean based duodenal ulcera, negative H. plyori  . ESOPHAGOGASTRODUODENOSCOPY N/A 01/30/2016   Procedure: ESOPHAGOGASTRODUODENOSCOPY (EGD);  Surgeon: Randall Binder, MD;  Location: [facility];  Service: Endoscopy;  Laterality: N/A;  . HERNIA REPAIR  2001  . PACEMAKER IMPLANT N/A 02/07/2017   Procedure: PACEMAKER IMPLANT;  Surgeon: Constance Haw, MD;  Location: [facility];  Service: Cardiovascular;  Laterality: N/A;  . PORTACATH PLACEMENT  2008     SOCIAL HISTORY:  Social History   Socioeconomic History  . Marital status: Married    Spouse name: Not on file  . Number of children: Not on file  . Years of education: Not on file  . Highest education level: Not on file  Occupational History  . Not on file  Social Needs  . Financial  resource strain: Not on file  . Food insecurity:    Worry: Not on file    Inability: Not on file  . Transportation needs:    Medical: Not on file    Non-medical: Not on file  Tobacco Use  . Smoking status: Former Smoker    Last attempt to quit: 09/11/1977    Years since quitting: 40.4  . Smokeless tobacco: Former Systems developer    Quit date: 04/25/1967  Substance and Sexual Activity  . Alcohol use: No  . Drug use: No  . Sexual activity: Not on file  Lifestyle  . Physical activity:    Days per week: Not on file    Minutes per session: Not on file  . Stress: Not on file  Relationships  . Social connections:    Talks on phone: Not on file    Gets together: Not on file    Attends religious service: Not on file    Active member of club or organization: Not on file    Attends meetings of clubs or organizations: Not on file    Relationship status: Not on file  . Intimate partner violence:    Fear of current or ex partner: Not on file    Emotionally abused: Not on file    Physically abused: Not on file    Forced sexual activity: Not on file  Other Topics Concern  . Not on file    Social History Narrative  . Not on file    FAMILY HISTORY:  Family History  Problem Relation Age of Onset  . Diabetes Father   . Colon cancer Neg Hx     CURRENT MEDICATIONS:  Outpatient Encounter Medications as of 02/25/2018  Medication Sig  . ACCU-CHEK AVIVA PLUS test strip   . ACCU-CHEK SOFTCLIX LANCETS lancets   . allopurinol (ZYLOPRIM) 100 MG tablet Take 1 tablet (100 mg total) by mouth daily.  . cholecalciferol (VITAMIN D) 400 units TABS tablet Take 400 Units by mouth daily.  [name] docusate sodium (COLACE) 100 MG capsule Take 100 mg by mouth 2 (two) times daily.  . feeding supplement, ENSURE ENLIVE, (ENSURE ENLIVE) LIQD Take 237 mLs by mouth 2 (two) times daily between meals.  . ibrutinib (IMBRUVICA) 140 MG capsul Take 2 capsules (280 mg total) by mouth daily.  [name] levETIRAcetam (KEPPRA) 100 MG/ML solution Take 7.5 mLs (750 mg total) by mouth 2 (two) times daily.  . rosuvastatin (CRESTOR) 40 MG tablet Take 40 mg by mouth at bedtime.   . sitaGLIPtin (JANUVIA) 100 MG tablet Take 100 mg by mouth daily.  . sodium bicarbonate 650 MG tablet TAKE 1 TABLET BY MOUTH TWICE A DAY AS DIRECTED  . sodium polystyrene (KAYEXALATE) powder   . vitamin C (ASCORBIC ACID) 500 MG tablet Take 500 mg by mouth daily.   Facility-Administered Encounter Medications as of 02/25/2018  Medication  . heparin lock flush 100 unit/mL  . sodium chloride flush (NS) 0.9 % injection 10 mL  . sodium polystyrene (KAYEXALATE) 15 GM/60ML suspension 30 g    ALLERGIES:  Allergies  Allergen Reactions  . Aspirin Other (See Comments)    Bleeding ulcers.   . Ibuprofen Other (See Comments)    Bleeding ulcers.     PHYSICAL EXAM:  ECOG Performance status: 1  Vitals:   02/25/18 1212  BP: 121/76  Pulse: 80  Resp: 16  Temp: (!) 97.5 F (36.4 C)  SpO2: 98%   Filed Weights   02/25/18 1212  Weight: 124 lb 8  oz (56.5 kg)    Physical Exam  Constitutional: He is oriented to person, place, and time. He appears  well-developed and well-nourished.  Musculoskeletal: Normal range of motion.  Neurological: He is alert and oriented to person, place, and time.  Skin: Skin is warm and dry.  Psychiatric: He has a normal mood and affect. His behavior is normal. Judgment and thought content normal.     LABORATORY DATA:  I have reviewed the labs as listed.  CBC    Component Value Date/Time   WBC 63.3 (HH) 02/25/2018 0943   RBC 2.77 (L) 02/25/2018 0943   HGB 10.1 (L) 02/25/2018 0943   HCT 33.4 (L) 02/25/2018 0943   PLT 107 (L) 02/25/2018 0943   MCV 120.6 (H) 02/25/2018 0943   MCH 36.5 (H) 02/25/2018 0943   MCHC 30.2 02/25/2018 0943   RDW 15.6 (H) 02/25/2018 0943   LYMPHSABS 61.1 (H) 02/25/2018 0943   MONOABS 0.5 02/25/2018 0943   EOSABS 0.1 02/25/2018 0943   BASOSABS 0.0 02/25/2018 0943   CMP Latest Ref Rng & Units 02/25/2018 01/27/2018 01/16/2018  Glucose 70 - 99 mg/dL 157(H) 113(H) 156(H)  BUN 8 - 23 mg/dL 31(H) 30(H) 38(H)  Creatinine 0.61 - 1.24 mg/dL 1.75(H) 1.76(H) 2.02(H)  Sodium 135 - 145 mmol/L 138 140 137  Potassium 3.5 - 5.1 mmol/L 4.3 4.6 4.9  Chloride 98 - 111 mmol/L 104 106 104  CO2 22 - 32 mmol/L 28 29 25   Calcium 8.9 - 10.3 mg/dL 8.7(L) 8.1(L) 8.5(L)  Total Protein 6.5 - 8.1 g/dL 7.6 7.0 7.4  Total Bilirubin 0.3 - 1.2 mg/dL 0.7 0.6 0.6  Alkaline Phos 38 - 126 U/L 60 75 121  AST 15 - 41 U/L 29 48(H) 30  ALT 0 - 44 U/L 27 39 28        ASSESSMENT & PLAN:   CLL (chronic lymphocytic leukemia) ([facility]) 1. Stage IV CLL with history of poor tolerance to past chemotherapies: -Could not tolerate past treatments including FCR in 2010, Imbruvica 2015 (located by large leg rash), Zydelig 0867 (complicated by transaminitis despite dose reduction), Imbruvica rechallenge in 6195 (complicated by infection and progressive fatigue), bendamustine/Rituxan 1 cycle in 0932 (complicated by prolonged and significant cytopenia in addition to tumor lysis syndrome and renal failure).  He could not even  tolerate last treatment with obinutuzumab and chlorambucil. -He reports recent onset night sweats, tiredness on exertion and 15 pound weight loss in the last 1 month. -Ibrutinib 140 mg daily started on 11/22/2017 after TLS prophylaxis with rasburicase.  He was hospitalized for the next 2 days for observation with continuous IV fluids and lab monitoring.  He did not develop any tumor lysis syndrome.  He received 1 unit of blood transfusion yesterday prior to discharge. - Ibrutinib was increased to 2 tablets daily on 11/29/2017.  He has noticed easy bruising.  His tumor lysis syndrome labs were within normal limits. - He is continuing to get a good response with downtrending white cell count.  He received 1 unit of blood transfusion on 12/13/2017. - He is continuing ibrutinib at this time. - Needle biopsy by Dr. Janace Hoard of the left parotid mass shows squamous cell carcinoma on 01/09/2018 - PET/CT scan on 01/10/2018 showed 3 hypermetabolic lesions associated with left parotid gland, largest bulges the skin surface.  Additional necrotic level 2 lymph node.  -The PET scan also showed mildly enlarged axillary, mediastinal, periaortic lymph nodes from his CLL.  There is also focal hypermetabolic activity along the gastric  fundus and distal esophagus.  This is likely gastritis.  He had a history of peptic ulcer. - He is scheduled for wide excision and lymph node dissection on 03/10/2018.  I have told him to hold off taking ibrutinib 1 week prior to planned surgery. -We reviewed blood counts today white count is 63.3.  Hemoglobin is 10.1 and platelet count is 107.  LDH was 136. -I will see him back in 3 weeks for follow-up.  2.  Left temporal squamous cell carcinoma: -Biopsy of the left forehead on 03/21/2017 was consistent with moderately differentiated squamous cell carcinoma.  Fine-needle aspiration of the parotid gland on 01/09/2018 was consistent with squamous cell carcinoma.  He follows up with Dr. Janace Hoard for  extensive resection. -He is scheduled for wide local excision by Dr. Janace Hoard on 03/10/2018.  3. TLS prevention: - He received rasburicase and was hospitalized for 2 days with continuous lab monitoring and IV fluids.  He did not develop any tumor lysis syndrome.  He is currently taking allopurinol daily.           Orders placed this encounter:  Orders Placed This Encounter  Procedures  . Phosphorus  . Lactate dehydrogenase  . Magnesium  . CBC with Differential/Platelet  . Comprehensive metabolic panel  . Uric acid      Derek Jack, MD [place] [phone]

## 2018-02-26 ENCOUNTER — Encounter: Payer: Self-pay | Admitting: Cardiology

## 2018-02-26 DIAGNOSIS — E1122 Type 2 diabetes mellitus with diabetic chronic kidney disease: Secondary | ICD-10-CM | POA: Diagnosis not present

## 2018-02-26 DIAGNOSIS — N189 Chronic kidney disease, unspecified: Secondary | ICD-10-CM | POA: Diagnosis not present

## 2018-02-26 DIAGNOSIS — E782 Mixed hyperlipidemia: Secondary | ICD-10-CM | POA: Diagnosis not present

## 2018-03-03 DIAGNOSIS — J069 Acute upper respiratory infection, unspecified: Secondary | ICD-10-CM | POA: Diagnosis not present

## 2018-03-03 DIAGNOSIS — J309 Allergic rhinitis, unspecified: Secondary | ICD-10-CM | POA: Diagnosis not present

## 2018-03-03 DIAGNOSIS — Z23 Encounter for immunization: Secondary | ICD-10-CM | POA: Diagnosis not present

## 2018-03-03 DIAGNOSIS — H6122 Impacted cerumen, left ear: Secondary | ICD-10-CM | POA: Diagnosis not present

## 2018-03-03 NOTE — Pre-Procedure Instructions (Signed)
CRISTHIAN VANHOOK  03/03/2018      [id number] - [place], [place] - [address] [place] AT [address] [place] [address] Phone: [phone] Fax: [phone]    Your procedure is scheduled on Monday, 03/10/2018.  Report to [facility] Admitting at 0930 A.M.  Call this number if you have problems the morning of surgery:  [phone]   Remember:  Do not eat or drink after midnight.     Take these medicines the morning of surgery with A SIP OF WATER: acetaminophen (TYLENOL) - if needed allopurinol (ZYLOPRIM) levETIRAcetam (KEPPRA) ibrutinib (IMBRUVICA)  7 days prior to surgery STOP taking any Aspirin (unless otherwise instructed by your surgeon), Aleve, Naproxen, Ibuprofen, Motrin, Advil, Goody's, BC's, all herbal medications, fish oil, and all vitamins   WHAT DO I DO ABOUT MY DIABETES MEDICATION?  [name] Do not take oral diabetes medicines (Januvia) the morning of surgery.    How to Manage Your Diabetes Before and After Surgery  Why is it important to control my blood sugar before and after surgery? . Improving blood sugar levels before and after surgery helps healing and can limit problems. . A way of improving blood sugar control is eating a healthy diet by: o  Eating less sugar and carbohydrates o  Increasing activity/exercise o  Talking with your doctor about reaching your blood sugar goals . High blood sugars (greater than 180 mg/dL) can raise your risk of infections and slow your recovery, so you will need to focus on controlling your diabetes during the weeks before surgery. . Make sure that the doctor who takes care of your diabetes knows about your planned surgery including the date and location.  How do I manage my blood sugar before surgery? . Check your blood sugar at least 4 times a day, starting 2 days before surgery, to make sure that the level is not too high or low. o Check your blood sugar the morning of your  surgery when you wake up and every 2 hours until you get to the Short Stay unit. . If your blood sugar is less than 70 mg/dL, you will need to treat for low blood sugar: o Do not take insulin. o Treat a low blood sugar (less than 70 mg/dL) with  cup of clear juice (cranberry or apple), 4 glucose tablets, OR glucose gel. o Recheck blood sugar in 15 minutes after treatment (to make sure it is greater than 70 mg/dL). If your blood sugar is not greater than 70 mg/dL on recheck, call [phone] for further instructions. . Report your blood sugar to the short stay nurse when you get to Short Stay.  . If you are admitted to the hospital after surgery: o Your blood sugar will be checked by the staff and you will probably be given insulin after surgery (instead of oral diabetes medicines) to make sure you have good blood sugar levels. o The goal for blood sugar control after surgery is 80-180 mg/dL.     Do not wear jewelry.  Do not wear lotions, powders, or colognes, or deodorant.  Men may shave face and neck.  Do not bring valuables to the hospital.  [facility] is not responsible for any belongings or valuables.  Contacts, eyeglasses, hearing aids, dentures or bridgework may not be worn into surgery.  Leave your suitcase in the car.  After surgery it may be brought to your room.  For patients admitted to the hospital, discharge time  will be determined by your treatment team.  Patients discharged the day of surgery will not be allowed to drive home.   Name and phone number of your driver:    Special instructions:   [facility]- Preparing For Surgery  Before surgery, you can play an important role. Because skin is not sterile, your skin needs to be as free of germs as possible. You can reduce the number of germs on your skin by washing with CHG (chlorahexidine gluconate) Soap before surgery.  CHG is an antiseptic cleaner which kills germs and bonds with the skin to continue killing germs even  after washing.    Oral Hygiene is also important to reduce your risk of infection.  Remember - BRUSH YOUR TEETH THE MORNING OF SURGERY WITH YOUR REGULAR TOOTHPASTE  Please do not use if you have an allergy to CHG or antibacterial soaps. If your skin becomes reddened/irritated stop using the CHG.  Do not shave (including legs and underarms) for at least 48 hours prior to first CHG shower. It is OK to shave your face.  Please follow these instructions carefully.   1. Shower the NIGHT BEFORE SURGERY and the MORNING OF SURGERY with CHG.   2. If you chose to wash your hair, wash your hair first as usual with your normal shampoo.  3. After you shampoo, rinse your hair and body thoroughly to remove the shampoo.  4. Use CHG as you would any other liquid soap. You can apply CHG directly to the skin and wash gently with a scrungie or a clean washcloth.   5. Apply the CHG Soap to your body ONLY FROM THE NECK DOWN.  Do not use on open wounds or open sores. Avoid contact with your eyes, ears, mouth and genitals (private parts). Wash Face and genitals (private parts)  with your normal soap.  6. Wash thoroughly, paying special attention to the area where your surgery will be performed.  7. Thoroughly rinse your body with warm water from the neck down.  8. DO NOT shower/wash with your normal soap after using and rinsing off the CHG Soap.  9. Pat yourself dry with a CLEAN TOWEL.  10. Wear CLEAN PAJAMAS to bed the night before surgery, wear comfortable clothes the morning of surgery  11. Place CLEAN SHEETS on your bed the night of your first shower and DO NOT SLEEP WITH PETS.    Day of Surgery: Shower as stated above. Do not apply any deodorants/lotions.  Please wear clean clothes to the hospital/surgery center.   Remember to brush your teeth WITH YOUR REGULAR TOOTHPASTE.    Please read over the following fact sheets that you were given. Coughing and Deep Breathing and Surgical Site Infection  Prevention

## 2018-03-04 ENCOUNTER — Other Ambulatory Visit: Payer: Self-pay

## 2018-03-04 ENCOUNTER — Encounter (HOSPITAL_COMMUNITY): Payer: Self-pay

## 2018-03-04 ENCOUNTER — Encounter (HOSPITAL_COMMUNITY)
Admission: RE | Admit: 2018-03-04 | Discharge: 2018-03-04 | Disposition: A | Discharge status: Home / Self Care | Payer: Medicare Other | Source: Ambulatory Visit | Attending: Otolaryngology | Admitting: Otolaryngology

## 2018-03-04 DIAGNOSIS — I4892 Unspecified atrial flutter: Secondary | ICD-10-CM | POA: Diagnosis not present

## 2018-03-04 DIAGNOSIS — D649 Anemia, unspecified: Secondary | ICD-10-CM | POA: Diagnosis not present

## 2018-03-04 DIAGNOSIS — R591 Generalized enlarged lymph nodes: Secondary | ICD-10-CM | POA: Insufficient documentation

## 2018-03-04 DIAGNOSIS — N183 Chronic kidney disease, stage 3 (moderate): Secondary | ICD-10-CM | POA: Diagnosis not present

## 2018-03-04 DIAGNOSIS — E1122 Type 2 diabetes mellitus with diabetic chronic kidney disease: Secondary | ICD-10-CM | POA: Diagnosis not present

## 2018-03-04 DIAGNOSIS — Z01812 Encounter for preprocedural laboratory examination: Secondary | ICD-10-CM | POA: Insufficient documentation

## 2018-03-04 DIAGNOSIS — Z950 Presence of cardiac pacemaker: Secondary | ICD-10-CM | POA: Diagnosis not present

## 2018-03-04 DIAGNOSIS — Z79899 Other long term (current) drug therapy: Secondary | ICD-10-CM | POA: Insufficient documentation

## 2018-03-04 DIAGNOSIS — Z87891 Personal history of nicotine dependence: Secondary | ICD-10-CM | POA: Insufficient documentation

## 2018-03-04 DIAGNOSIS — I129 Hypertensive chronic kidney disease with stage 1 through stage 4 chronic kidney disease, or unspecified chronic kidney disease: Secondary | ICD-10-CM | POA: Insufficient documentation

## 2018-03-04 HISTORY — DX: Presence of cardiac pacemaker: Z95.0

## 2018-03-04 HISTORY — DX: Pneumonia, unspecified organism: J18.9

## 2018-03-04 LAB — CBC
HCT: 35.2 % — ABNORMAL LOW (ref 39.0–52.0)
HEMOGLOBIN: 10.8 g/dL — AB (ref 13.0–17.0)
MCH: 35.8 pg — AB (ref 26.0–34.0)
MCHC: 30.7 g/dL (ref 30.0–36.0)
MCV: 116.6 fL — ABNORMAL HIGH (ref 80.0–100.0)
Platelets: 145 10*3/uL — ABNORMAL LOW (ref 150–400)
RBC: 3.02 MIL/uL — AB (ref 4.22–5.81)
RDW: 14.6 % (ref 11.5–15.5)
WBC: 73 10*3/uL (ref 4.0–10.5)
nRBC: 0 % (ref 0.0–0.2)

## 2018-03-04 LAB — BASIC METABOLIC PANEL
ANION GAP: 9 (ref 5–15)
BUN: 45 mg/dL — ABNORMAL HIGH (ref 8–23)
CO2: 23 mmol/L (ref 22–32)
Calcium: 9.3 mg/dL (ref 8.9–10.3)
Chloride: 104 mmol/L (ref 98–111)
Creatinine, Ser: 2.27 mg/dL — ABNORMAL HIGH (ref 0.61–1.24)
GFR calc Af Amer: 30 mL/min — ABNORMAL LOW (ref >60)
GFR, EST NON AFRICAN AMERICAN: 26 mL/min — AB (ref >60)
GLUCOSE: 93 mg/dL (ref 70–99)
POTASSIUM: 4.8 mmol/L (ref 3.5–5.1)
Sodium: 136 mmol/L (ref 135–145)

## 2018-03-04 LAB — GLUCOSE, CAPILLARY: Glucose-Capillary: 92 mg/dL (ref 70–99)

## 2018-03-04 NOTE — Progress Notes (Signed)
PCP - Dr. Allyn Kenner Cardiologist - Dr. Bronson Ing  Chest x-ray - 12/17/17 EKG - 10/08/17 Stress Test - denies ECHO - denies Cardiac Cath - denies  Pt has medtronics pacemaker  Sleep Study - denies   Fasting Blood Sugar - 98-160 Checks Blood Sugar 1 times a day  Patient informed per Dr. Sonny Masters (oncology) to discontinue ibrutinib starting 03/03/18.  Anesthesia review: yes- cardiac history  Patient denies shortness of breath, fever, cough and chest pain at PAT appointment   Patient verbalized understanding of instructions that were given to them at the PAT appointment. Patient was also instructed that they will need to review over the PAT instructions again at home before surgery.

## 2018-03-04 NOTE — Progress Notes (Signed)
CRITICAL VALUE ALERT  Critical Value:  WBC- 73.0  Date & Time Notied: 03/04/18 11:59 am  Provider Notified: Left message with Dr. Janace Hoard assistant  Orders Received/Actions taken: waiting on call back if needed

## 2018-03-05 LAB — HEMOGLOBIN A1C
HEMOGLOBIN A1C: 5.3 % (ref 4.8–5.6)
Mean Plasma Glucose: 105 mg/dL

## 2018-03-05 NOTE — Addendum Note (Signed)
Addended by: Cecil Cranker on: 03/05/2018 08:29 AM   Modules accepted: Orders

## 2018-03-05 NOTE — Progress Notes (Addendum)
Anesthesia Chart Review:  Case:  [id number] Date/Time:  03/10/18 1115   Procedures:      PAROTIDECTOMY (Left )     EXCISION MASS HEAD (Left )     SKIN GRAFT FULL THICKNESS (Left )   Anesthesia type:  General   Pre-op diagnosis:  Lymphadenopathy   Location:  [facility] OR ROOM 08 / [place] [facility]   Surgeon:  Melissa Montane, MD      DISCUSSION: 82 yo male former smoker. Pertinent hx includes DMII, HTN, CKD III, Anemia, CLL, DMII, Aflutter not anticoagulated due to thrombocytopenia, High grade AV block treated with [employer] PPM.  Device orders on pt chart.  Anticipate he can proceed as planned barring acute status change.  VS: There were no vitals taken for this visit.  PROVIDERS: [name], MD is PCP  Allegra Lai, MD is EP Cardiologist  [name], MD is Cardiologist  Luvenia Redden is Nephrology provider  Derek Jack, MD is Hematologist, advised pt to hold ibrutinib 1 week prior to surgery  LABS: Labs reviewed: Acceptable for surgery. Elevated creatinine. Pt has hx of CKD with baseline creatinine ~1.8-2.0. Stable thrombocytopenia. WBC elevation due to CLL.  (all labs ordered are listed, but only abnormal results are displayed)  Labs Reviewed  BASIC METABOLIC PANEL - Abnormal; Notable for the following components:      Result Value   BUN 45 (*)    Creatinine, Ser 2.27 (*)    GFR calc non Af Amer 26 (*)    GFR calc Af Amer 30 (*)    All other components within normal limits  CBC - Abnormal; Notable for the following components:   WBC 73.0 (*)    RBC 3.02 (*)    Hemoglobin 10.8 (*)    HCT 35.2 (*)    MCV 116.6 (*)    MCH 35.8 (*)    Platelets 145 (*)    All other components within normal limits  GLUCOSE, CAPILLARY  HEMOGLOBIN A1C     IMAGES: NM PET 01/10/2018: IMPRESSION: 1. No clear evidence transformation of chronic lymphocytic leukemia. Mildly enlarged axillary, mediastinal and periaortic lymph nodes have very low metabolic activity. 2. Spleen is  normal size and normal metabolic activity. 3. Focal hypermetabolic activity through the gastric fundus and distal esophagus. Consider upper endoscopy to evaluate for malignancy versus gastritis. 4. Focal activity within the RIGHT gluteus muscle with mild soft tissue thickening. Differential would include trauma versus potential injection site. Metastatic disease is less favored. 5. Three hypermetabolic lesions associated with the LEFT parotid gland. The largest bulges the skin surface anterior to the LEFT parotid gland. Additionally there is necrotic level II lymph node on LEFT adjacent beneath the sternocleidomastoid muscle. Concern for primary skin carcinoma versus multiple parotid neoplasms versus metastatic squamous cell head and neck carcinoma disease. RECOMMEND BIOPSY OF THE SUPERFICIAL LESION WHICH IS CERTAINLY PALPABLE.  CT Chest 12/31/2017: IMPRESSION: New RIGHT middle lobe segmental atelectasis versus consolidation.  New linear opacity in medial LEFT upper lobe which could represent infiltrate or atelectasis, though there is overall improved infiltrate LEFT upper lobe versus previous exam.  Small RIGHT pleural effusion.  Persistent thoracic and upper abdominal adenopathy likely related to history of CLL, majority of nodes stable to slightly decreased in sizes though there are nodes in the RIGHT axilla which have increased in size since the previous study  EKG: 10/04/2017: Atrial flutter with predominant 3:1 AV block. Right bundle branch block. No significant change since last tracing  CV: TTE 01/29/2018:  Study Conclusions  - Left ventricle: The cavity size was normal. Systolic function was   normal. The estimated ejection fraction was 55%. Wall motion was   normal; there were no regional wall motion abnormalities. There   was an increased relative contribution of atrial contraction to   ventricular filling. Doppler parameters are consistent with   abnormal left  ventricular relaxation (grade 1 diastolic   dysfunction). - Aortic valve: Trileaflet; normal thickness, mildly calcified   leaflets. There was mild regurgitation. - Mitral valve: There was mild regurgitation. - Pulmonic valve: There was mild regurgitation. - Inferior vena cava: The vessel was mildly dilated. - Pericardium, extracardiac: A possible, trivial, free-flowing   pericardial effusion was identified at the apex. The fluid had no   internal echoes.   Past Medical History:  Diagnosis Date  . Anemia   . B12 deficiency 12/07/2014  . CLL (chronic lymphocytic leukemia) ([place]) 01/24/2011  . Diabetes mellitus   . DM (diabetes mellitus) ([place]) 01/24/2011  . Eczema 01/24/2011  . History of pneumonia 04/2014  . Hypertension   . Leukemia ([place]) 6.22.2012   PER PATIENT  . Melanoma in situ ([place]) 01/24/2011  . Mild obesity 01/24/2011  . Pneumonia    2018  . Port catheter in place 05/02/2012  . Presence of permanent cardiac pacemaker    [employer]  . Shingles   . Ulcer     Past Surgical History:  Procedure Laterality Date  . bleeding ulcer    . CATARACT EXTRACTION W/PHACO  05/05/2012   Procedure: CATARACT EXTRACTION PHACO AND INTRAOCULAR LENS PLACEMENT (IOC);  Surgeon: Tonny Branch, MD;  Location: [facility];  Service: Ophthalmology;  Laterality: Right;  CDE:13.25  . CATARACT EXTRACTION W/PHACO  05/15/2012   Procedure: CATARACT EXTRACTION PHACO AND INTRAOCULAR LENS PLACEMENT (IOC);  Surgeon: Tonny Branch, MD;  Location: [facility];  Service: Ophthalmology;  Laterality: Left;  CDE:  12.32  . COLONOSCOPY N/A 01/30/2016   Procedure: COLONOSCOPY;  Surgeon: Danie Binder, MD;  Location: [facility] SU[facility];  Service: Endoscopy;  Laterality: N/A;  2:00 PM - moved to 1:00 - office notified pt  . ESOPHAGOGASTRODUODENOSCOPY  2008   Dr. Oneida Alar: normal esophagus, antral erythema, 1 cm clean based duodenal ulcera, negative H. plyori  . ESOPHAGOGASTRODUODENOSCOPY N/A 01/30/2016   Procedure: ESOPHAGOGASTRODUODENOSCOPY  (EGD);  Surgeon: Danie Binder, MD;  Location: [facility];  Service: Endoscopy;  Laterality: N/A;  . EYE SURGERY    . HERNIA REPAIR  2001  . PACEMAKER IMPLANT N/A 02/07/2017   Procedure: PACEMAKER IMPLANT;  Surgeon: Constance Haw, MD;  Location: [facility];  Service: Cardiovascular;  Laterality: N/A;  . PORTACATH PLACEMENT  2008  . TONSILLECTOMY     removed around 19-30 years of age    MEDICATIONS: . acetaminophen (TYLENOL) 500 MG tablet  . allopurinol (ZYLOPRIM) 100 MG tablet  . cholecalciferol (VITAMIN D) 400 units TABS tablet  . docusate sodium (COLACE) 100 MG capsule  . feeding supplement, ENSURE ENLIVE, (ENSURE ENLIVE) LIQD  . ibrutinib (IMBRUVICA) 140 MG capsul  . levETIRAcetam (KEPPRA) 100 MG/ML solution  . rosuvastatin (CRESTOR) 40 MG tablet  . sitaGLIPtin (JANUVIA) 100 MG tablet  . sodium bicarbonate 650 MG tablet  . vitamin C (ASCORBIC ACID) 500 MG tablet   No current facility-administered medications for this encounter.    . heparin lock flush 100 unit/mL  . sodium chloride flush (NS) 0.9 % injection 10 mL  . sodium polystyrene (KAYEXALATE) 15 GM/60ML suspension 30 g  Wynonia Musty [facility] Short Stay Center/Anesthesiology Phone [phone] 03/05/2018 3:51 PM

## 2018-03-05 NOTE — Anesthesia Preprocedure Evaluation (Addendum)
Anesthesia Evaluation  Patient identified by MRN, date of birth, ID band Patient awake    Reviewed: Allergy & Precautions, H&P , NPO status , Patient's Chart, lab work & pertinent test results  Airway Mallampati: III  TM Distance: >3 FB Neck ROM: Full  Mouth opening: Limited Mouth Opening  Dental no notable dental hx. (+) Edentulous Upper, Edentulous Lower, Dental Advisory Given   Pulmonary neg pulmonary ROS, former smoker,    Pulmonary exam normal breath sounds clear to auscultation       Cardiovascular hypertension, + dysrhythmias Atrial Fibrillation + pacemaker  Rhythm:Regular Rate:Normal     Neuro/Psych negative neurological ROS  negative psych ROS   GI/Hepatic negative GI ROS, Neg liver ROS,   Endo/Other  diabetes, Type 2, Oral Hypoglycemic Agents  Renal/GU Renal InsufficiencyRenal disease  negative genitourinary   Musculoskeletal   Abdominal   Peds  Hematology  (+) Blood dyscrasia, anemia , CLL   Anesthesia Other Findings   Reproductive/Obstetrics negative OB ROS                           Anesthesia Physical Anesthesia Plan  ASA: III  Anesthesia Plan: General   Post-op Pain Management:    Induction: Intravenous  PONV Risk Score and Plan: 3 and Ondansetron, Dexamethasone and Treatment may vary due to age or medical condition  Airway Management Planned: Oral ETT  Additional Equipment:   Intra-op Plan:   Post-operative Plan: Extubation in OR  Informed Consent: I have reviewed the patients History and Physical, chart, labs and discussed the procedure including the risks, benefits and alternatives for the proposed anesthesia with the patient or authorized representative who has indicated his/her understanding and acceptance.   Dental advisory given  Plan Discussed with: CRNA  Anesthesia Plan Comments: (See PAT note written 03/05/2018 by Karoline Caldwell, PA-C )        Anesthesia Quick Evaluation

## 2018-03-10 ENCOUNTER — Ambulatory Visit (HOSPITAL_COMMUNITY): Payer: Medicare Other | Admitting: Anesthesiology

## 2018-03-10 ENCOUNTER — Other Ambulatory Visit: Payer: Self-pay

## 2018-03-10 ENCOUNTER — Inpatient Hospital Stay (HOSPITAL_COMMUNITY)
Admission: AD | Admit: 2018-03-10 | Discharge: 2018-03-12 | DRG: 577 | Disposition: A | Discharge status: Home / Self Care | Payer: Medicare Other | Attending: Otolaryngology | Admitting: Otolaryngology

## 2018-03-10 ENCOUNTER — Encounter (HOSPITAL_COMMUNITY)
Admission: AD | Disposition: A | Discharge status: Home / Self Care | Payer: Self-pay | Source: Home / Self Care | Attending: Otolaryngology

## 2018-03-10 ENCOUNTER — Ambulatory Visit (HOSPITAL_COMMUNITY): Payer: Medicare Other | Admitting: Physician Assistant

## 2018-03-10 ENCOUNTER — Encounter (HOSPITAL_COMMUNITY): Payer: Self-pay | Admitting: Surgery

## 2018-03-10 DIAGNOSIS — E119 Type 2 diabetes mellitus without complications: Secondary | ICD-10-CM | POA: Diagnosis present

## 2018-03-10 DIAGNOSIS — Z9089 Acquired absence of other organs: Secondary | ICD-10-CM | POA: Diagnosis not present

## 2018-03-10 DIAGNOSIS — I10 Essential (primary) hypertension: Secondary | ICD-10-CM | POA: Diagnosis present

## 2018-03-10 DIAGNOSIS — Z9841 Cataract extraction status, right eye: Secondary | ICD-10-CM

## 2018-03-10 DIAGNOSIS — Z9842 Cataract extraction status, left eye: Secondary | ICD-10-CM | POA: Diagnosis not present

## 2018-03-10 DIAGNOSIS — Z950 Presence of cardiac pacemaker: Secondary | ICD-10-CM

## 2018-03-10 DIAGNOSIS — C07 Malignant neoplasm of parotid gland: Secondary | ICD-10-CM | POA: Diagnosis not present

## 2018-03-10 DIAGNOSIS — C44309 Unspecified malignant neoplasm of skin of other parts of face: Secondary | ICD-10-CM | POA: Diagnosis not present

## 2018-03-10 DIAGNOSIS — Z886 Allergy status to analgesic agent status: Secondary | ICD-10-CM

## 2018-03-10 DIAGNOSIS — C9110 Chronic lymphocytic leukemia of B-cell type not having achieved remission: Secondary | ICD-10-CM | POA: Diagnosis not present

## 2018-03-10 DIAGNOSIS — Z86006 Personal history of melanoma in-situ: Secondary | ICD-10-CM

## 2018-03-10 DIAGNOSIS — Z833 Family history of diabetes mellitus: Secondary | ICD-10-CM

## 2018-03-10 DIAGNOSIS — C44329 Squamous cell carcinoma of skin of other parts of face: Secondary | ICD-10-CM | POA: Diagnosis present

## 2018-03-10 DIAGNOSIS — Z87891 Personal history of nicotine dependence: Secondary | ICD-10-CM | POA: Diagnosis not present

## 2018-03-10 DIAGNOSIS — I129 Hypertensive chronic kidney disease with stage 1 through stage 4 chronic kidney disease, or unspecified chronic kidney disease: Secondary | ICD-10-CM | POA: Diagnosis not present

## 2018-03-10 DIAGNOSIS — C4442 Squamous cell carcinoma of skin of scalp and neck: Principal | ICD-10-CM | POA: Diagnosis present

## 2018-03-10 DIAGNOSIS — Z961 Presence of intraocular lens: Secondary | ICD-10-CM | POA: Diagnosis not present

## 2018-03-10 DIAGNOSIS — C770 Secondary and unspecified malignant neoplasm of lymph nodes of head, face and neck: Secondary | ICD-10-CM | POA: Diagnosis not present

## 2018-03-10 DIAGNOSIS — N189 Chronic kidney disease, unspecified: Secondary | ICD-10-CM | POA: Diagnosis not present

## 2018-03-10 DIAGNOSIS — C779 Secondary and unspecified malignant neoplasm of lymph node, unspecified: Secondary | ICD-10-CM | POA: Diagnosis not present

## 2018-03-10 DIAGNOSIS — C4440 Unspecified malignant neoplasm of skin of scalp and neck: Secondary | ICD-10-CM | POA: Diagnosis present

## 2018-03-10 DIAGNOSIS — C760 Malignant neoplasm of head, face and neck: Secondary | ICD-10-CM | POA: Diagnosis not present

## 2018-03-10 HISTORY — PX: PAROTIDECTOMY: SUR1003

## 2018-03-10 HISTORY — PX: EXCISION MASS HEAD: SHX6702

## 2018-03-10 HISTORY — PX: PAROTIDECTOMY: SHX2163

## 2018-03-10 HISTORY — DX: Chronic kidney disease, unspecified: N18.9

## 2018-03-10 HISTORY — PX: SKIN FULL THICKNESS GRAFT: SHX442

## 2018-03-10 LAB — CBC
HCT: 29.3 % — ABNORMAL LOW (ref 39.0–52.0)
HEMOGLOBIN: 9 g/dL — AB (ref 13.0–17.0)
MCH: 35.2 pg — AB (ref 26.0–34.0)
MCHC: 30.7 g/dL (ref 30.0–36.0)
MCV: 114.5 fL — ABNORMAL HIGH (ref 80.0–100.0)
NRBC: 0 % (ref 0.0–0.2)
PLATELETS: 157 10*3/uL (ref 150–400)
RBC: 2.56 MIL/uL — AB (ref 4.22–5.81)
RDW: 14.4 % (ref 11.5–15.5)
WBC: 32.3 10*3/uL — AB (ref 4.0–10.5)

## 2018-03-10 LAB — GLUCOSE, CAPILLARY
GLUCOSE-CAPILLARY: 116 mg/dL — AB (ref 70–99)
Glucose-Capillary: 147 mg/dL — ABNORMAL HIGH (ref 70–99)

## 2018-03-10 SURGERY — EXCISION, PAROTID GLAND
Anesthesia: General | Site: Head | Laterality: Left

## 2018-03-10 MED ORDER — 0.9 % SODIUM CHLORIDE (POUR BTL) OPTIME
TOPICAL | Status: DC | PRN
  Administered 2018-03-10: 1000 mL

## 2018-03-10 MED ORDER — CEFAZOLIN SODIUM-DEXTROSE 1-4 GM/50ML-% IV SOLN
1.0000 g | Freq: Two times a day (BID) | INTRAVENOUS | Status: DC
  Administered 2018-03-10 – 2018-03-12 (×4): 1 g via INTRAVENOUS
  Filled 2018-03-10 (×6): qty 50

## 2018-03-10 MED ORDER — BACITRACIN ZINC 500 UNIT/GM EX OINT
TOPICAL_OINTMENT | CUTANEOUS | Status: AC
  Filled 2018-03-10: qty 28.35

## 2018-03-10 MED ORDER — CHLORHEXIDINE GLUCONATE CLOTH 2 % EX PADS: 6.0000 | MEDICATED_PAD | Freq: Once | CUTANEOUS | Status: DC

## 2018-03-10 MED ORDER — SUCCINYLCHOLINE CHLORIDE 200 MG/10ML IV SOSY
PREFILLED_SYRINGE | INTRAVENOUS | Status: DC | PRN
  Administered 2018-03-10: 80 mg via INTRAVENOUS

## 2018-03-10 MED ORDER — MORPHINE SULFATE (PF) 2 MG/ML IV SOLN
2.0000 mg | INTRAVENOUS | Status: DC | PRN
  Administered 2018-03-10 (×2): 2 mg via INTRAVENOUS
  Filled 2018-03-10 (×2): qty 1

## 2018-03-10 MED ORDER — CEFAZOLIN SODIUM 1 G IJ SOLR
INTRAMUSCULAR | Status: AC
  Filled 2018-03-10: qty 20

## 2018-03-10 MED ORDER — OXYCODONE HCL 5 MG PO TABS
5.0000 mg | ORAL_TABLET | ORAL | Status: DC | PRN
  Administered 2018-03-11 (×2): 10 mg via ORAL
  Filled 2018-03-10 (×2): qty 2

## 2018-03-10 MED ORDER — SUCCINYLCHOLINE CHLORIDE 200 MG/10ML IV SOSY
PREFILLED_SYRINGE | INTRAVENOUS | Status: AC
  Filled 2018-03-10: qty 10

## 2018-03-10 MED ORDER — ONDANSETRON HCL 4 MG/2ML IJ SOLN
INTRAMUSCULAR | Status: DC | PRN
  Administered 2018-03-10: 4 mg via INTRAVENOUS

## 2018-03-10 MED ORDER — DEXTROSE-NACL 5-0.45 % IV SOLN
INTRAVENOUS | Status: DC
  Administered 2018-03-10 – 2018-03-11 (×2): via INTRAVENOUS

## 2018-03-10 MED ORDER — LIDOCAINE 2% (20 MG/ML) 5 ML SYRINGE
INTRAMUSCULAR | Status: DC | PRN
  Administered 2018-03-10: 60 mg via INTRAVENOUS

## 2018-03-10 MED ORDER — DOCUSATE SODIUM 100 MG PO CAPS: 100.0000 mg | ORAL_CAPSULE | Freq: Every day | ORAL | Status: DC | PRN

## 2018-03-10 MED ORDER — ALLOPURINOL 100 MG PO TABS
100.0000 mg | ORAL_TABLET | Freq: Every day | ORAL | Status: DC
  Administered 2018-03-11 – 2018-03-12 (×2): 100 mg via ORAL
  Filled 2018-03-10 (×3): qty 1

## 2018-03-10 MED ORDER — SODIUM CHLORIDE 0.9 % IV SOLN
INTRAVENOUS | Status: DC | PRN
  Administered 2018-03-10: 15:00:00 via INTRAVENOUS
  Administered 2018-03-10: 20 ug/min via INTRAVENOUS

## 2018-03-10 MED ORDER — HYDROMORPHONE HCL 1 MG/ML IJ SOLN: 0.2500 mg | INTRAMUSCULAR | Status: DC | PRN

## 2018-03-10 MED ORDER — BACITRACIN ZINC 500 UNIT/GM EX OINT
TOPICAL_OINTMENT | CUTANEOUS | Status: DC | PRN
  Administered 2018-03-10: 1 via TOPICAL

## 2018-03-10 MED ORDER — ONDANSETRON HCL 4 MG/2ML IJ SOLN
INTRAMUSCULAR | Status: AC
  Filled 2018-03-10: qty 2

## 2018-03-10 MED ORDER — ONDANSETRON 4 MG PO TBDP: 4.0000 mg | ORAL_TABLET | Freq: Four times a day (QID) | ORAL | Status: DC | PRN

## 2018-03-10 MED ORDER — FENTANYL CITRATE (PF) 250 MCG/5ML IJ SOLN
INTRAMUSCULAR | Status: AC
  Filled 2018-03-10: qty 5

## 2018-03-10 MED ORDER — CEFAZOLIN SODIUM-DEXTROSE 2-3 GM-%(50ML) IV SOLR
INTRAVENOUS | Status: DC | PRN
  Administered 2018-03-10: 2 g via INTRAVENOUS

## 2018-03-10 MED ORDER — SODIUM BICARBONATE 650 MG PO TABS
650.0000 mg | ORAL_TABLET | Freq: Two times a day (BID) | ORAL | Status: DC
  Administered 2018-03-10 – 2018-03-12 (×4): 650 mg via ORAL
  Filled 2018-03-10 (×4): qty 1

## 2018-03-10 MED ORDER — PROPOFOL 10 MG/ML IV BOLUS
INTRAVENOUS | Status: DC | PRN
  Administered 2018-03-10: 70 mg via INTRAVENOUS

## 2018-03-10 MED ORDER — ONDANSETRON HCL 4 MG/2ML IJ SOLN
4.0000 mg | Freq: Four times a day (QID) | INTRAMUSCULAR | Status: DC | PRN
  Administered 2018-03-10 – 2018-03-11 (×2): 4 mg via INTRAVENOUS
  Filled 2018-03-10 (×2): qty 2

## 2018-03-10 MED ORDER — FENTANYL CITRATE (PF) 100 MCG/2ML IJ SOLN
INTRAMUSCULAR | Status: DC | PRN
  Administered 2018-03-10 (×7): 50 ug via INTRAVENOUS

## 2018-03-10 MED ORDER — DEXAMETHASONE SODIUM PHOSPHATE 10 MG/ML IJ SOLN
INTRAMUSCULAR | Status: AC
  Filled 2018-03-10: qty 1

## 2018-03-10 MED ORDER — DEXAMETHASONE SODIUM PHOSPHATE 10 MG/ML IJ SOLN
INTRAMUSCULAR | Status: DC | PRN
  Administered 2018-03-10: 4 mg via INTRAVENOUS

## 2018-03-10 MED ORDER — CHOLECALCIFEROL 10 MCG (400 UNIT) PO TABS
400.0000 [IU] | ORAL_TABLET | Freq: Every day | ORAL | Status: DC
  Administered 2018-03-11 – 2018-03-12 (×2): 400 [IU] via ORAL
  Filled 2018-03-10 (×3): qty 1

## 2018-03-10 MED ORDER — LIDOCAINE 2% (20 MG/ML) 5 ML SYRINGE
INTRAMUSCULAR | Status: AC
  Filled 2018-03-10: qty 5

## 2018-03-10 MED ORDER — SODIUM CHLORIDE 0.9 % IV SOLN
INTRAVENOUS | Status: DC | PRN
  Administered 2018-03-10 (×2): via INTRAVENOUS

## 2018-03-10 SURGICAL SUPPLY — 85 items
ATTRACTOMAT 16X20 MAGNETIC DRP (DRAPES) IMPLANT
BLADE 15 SAFETY STRL DISP (BLADE) IMPLANT
BLADE CLIPPER SURG (BLADE) IMPLANT
BLADE SURG 10 STRL SS (BLADE) ×4 IMPLANT
BLADE SURG 12 STRL SS (BLADE) ×4 IMPLANT
BLADE SURG 15 STRL LF DISP TIS (BLADE) IMPLANT
BLADE SURG 15 STRL SS (BLADE)
BUR ROUND FLUTED 5 RND (BURR) ×1 IMPLANT
BUR ROUND FLUTED 5MM RND (BURR) ×1
CANISTER SUCT 3000ML PPV (MISCELLANEOUS) ×4 IMPLANT
CLEANER TIP ELECTROSURG 2X2 (MISCELLANEOUS) ×4 IMPLANT
CONT SPEC 4OZ CLIKSEAL STRL BL (MISCELLANEOUS) ×4 IMPLANT
CORDS BIPOLAR (ELECTRODE) ×4 IMPLANT
COVER SURGICAL LIGHT HANDLE (MISCELLANEOUS) ×4 IMPLANT
COVER WAND RF STERILE (DRAPES) ×4 IMPLANT
CRADLE DONUT ADULT HEAD (MISCELLANEOUS) IMPLANT
DRAIN CHANNEL 15F RND FF W/TCR (WOUND CARE) IMPLANT
DRAIN HEMOVAC 1/8 X 5 (WOUND CARE) IMPLANT
DRAIN JACKSON RD 7FR 3/32 (WOUND CARE) ×2 IMPLANT
DRAPE HALF SHEET 40X57 (DRAPES) IMPLANT
DRAPE INCISE 13X13 STRL (DRAPES) ×4 IMPLANT
DRAPE ORTHO SPLIT 77X108 STRL (DRAPES)
DRAPE SURG ORHT 6 SPLT 77X108 (DRAPES) IMPLANT
DRSG CUTIMED SORBACT 7X9 (GAUZE/BANDAGES/DRESSINGS) ×2 IMPLANT
ELECT COATED BLADE 2.86 ST (ELECTRODE) ×4 IMPLANT
ELECT NDL TIP 2.8 STRL (NEEDLE) IMPLANT
ELECT NEEDLE TIP 2.8 STRL (NEEDLE) IMPLANT
ELECT PAIRED SUBDERMAL (MISCELLANEOUS) ×4
ELECT REM PT RETURN 9FT ADLT (ELECTROSURGICAL) ×4
ELECTRODE PAIRED SUBDERMAL (MISCELLANEOUS) ×2 IMPLANT
ELECTRODE REM PT RTRN 9FT ADLT (ELECTROSURGICAL) ×2 IMPLANT
EVACUATOR SILICONE 100CC (DRAIN) IMPLANT
FORCEPS BIPOLAR SPETZLER 8 1.0 (NEUROSURGERY SUPPLIES) ×2 IMPLANT
GAUZE 4X4 16PLY RFD (DISPOSABLE) ×14 IMPLANT
GAUZE SPONGE 4X4 12PLY STRL (GAUZE/BANDAGES/DRESSINGS) ×2 IMPLANT
GAUZE XEROFORM 1X8 LF (GAUZE/BANDAGES/DRESSINGS) IMPLANT
GAUZE XEROFORM 5X9 LF (GAUZE/BANDAGES/DRESSINGS) IMPLANT
GLOVE ECLIPSE 7.5 STRL STRAW (GLOVE) ×4 IMPLANT
GLOVE SS BIOGEL STRL SZ 7.5 (GLOVE) ×2 IMPLANT
GLOVE SUPERSENSE BIOGEL SZ 7.5 (GLOVE) ×2
GOWN STRL REUS W/ TWL LRG LVL3 (GOWN DISPOSABLE) ×6 IMPLANT
GOWN STRL REUS W/TWL LRG LVL3 (GOWN DISPOSABLE) ×12
HEMOSTAT ARISTA ABSORB 3G PWDR (MISCELLANEOUS) ×2 IMPLANT
KIT BASIN OR (CUSTOM PROCEDURE TRAY) ×4 IMPLANT
KIT TURNOVER KIT B (KITS) ×4 IMPLANT
LOCATOR NERVE 3 VOLT (DISPOSABLE) IMPLANT
MATRIX WOUND 3-LAYER 10X15 (Tissue) ×1 IMPLANT
MICROMATRIX 1000MG (Tissue) ×4 IMPLANT
MICROMATRIX 500MG (Tissue) ×4 IMPLANT
NDL HYPO 25GX1X1/2 BEV (NEEDLE) IMPLANT
NEEDLE HYPO 25GX1X1/2 BEV (NEEDLE) IMPLANT
NS IRRIG 1000ML POUR BTL (IV SOLUTION) ×4 IMPLANT
PAD ARMBOARD 7.5X6 YLW CONV (MISCELLANEOUS) ×8 IMPLANT
PENCIL FOOT CONTROL (ELECTRODE) ×4 IMPLANT
PROBE NERVBE PRASS .33 (MISCELLANEOUS) ×4 IMPLANT
SOLUTION PARTIC MCRMTRX 1000MG (Tissue) IMPLANT
SOLUTION PARTIC MCRMTRX 500MG (Tissue) IMPLANT
SPECIMEN JAR MEDIUM (MISCELLANEOUS) IMPLANT
SPONGE INTESTINAL PEANUT (DISPOSABLE) IMPLANT
SPONGE LAP 18X18 X RAY DECT (DISPOSABLE) IMPLANT
STAPLER VISISTAT 35W (STAPLE) ×4 IMPLANT
SUT CHROMIC 3 0 SH 27 (SUTURE) ×2 IMPLANT
SUT CHROMIC 4 0 PS 2 18 (SUTURE) ×4 IMPLANT
SUT CHROMIC 5 0 P 3 (SUTURE) IMPLANT
SUT CHROMIC 5 0 RB 1 27 (SUTURE) IMPLANT
SUT ETHILON 3 0 PS 1 (SUTURE) ×4 IMPLANT
SUT ETHILON 5 0 P 3 18 (SUTURE) ×2
SUT ETHILON 5 0 PS 2 18 (SUTURE) IMPLANT
SUT NYLON ETHILON 5-0 P-3 1X18 (SUTURE) ×2 IMPLANT
SUT SILK 2 0 (SUTURE)
SUT SILK 2 0 SH CR/8 (SUTURE) ×4 IMPLANT
SUT SILK 2-0 18XBRD TIE 12 (SUTURE) IMPLANT
SUT SILK 3 0 (SUTURE) ×8
SUT SILK 3-0 18XBRD TIE 12 (SUTURE) ×2 IMPLANT
SUT SILK 4 0 (SUTURE) ×4
SUT SILK 4 0 TIES 17X18 (SUTURE) ×4 IMPLANT
SUT SILK 4-0 18XBRD TIE 12 (SUTURE) ×2 IMPLANT
SUT VIC AB 3-0 FS2 27 (SUTURE) IMPLANT
SUT VIC AB 3-0 SH 18 (SUTURE) IMPLANT
TOWEL OR 17X24 6PK STRL BLUE (TOWEL DISPOSABLE) ×4 IMPLANT
TRAY ENT MC OR (CUSTOM PROCEDURE TRAY) ×4 IMPLANT
TRAY FOLEY MTR SLVR 14FR STAT (SET/KITS/TRAYS/PACK) IMPLANT
TUBE FEEDING 10FR FLEXIFLO (MISCELLANEOUS) IMPLANT
WATER STERILE IRR 1000ML POUR (IV SOLUTION) ×4 IMPLANT
WOUND MATRIX 3-LAYER 10X15 (Tissue) ×1 IMPLANT

## 2018-03-10 NOTE — Transfer of Care (Signed)
Immediate Anesthesia Transfer of Care Note  Patient: Randall Reyes  Procedure(s) Performed: LEFT SIDE PAROTIDECTOMTY AND NECK DISECTION WITH RESECTION OF A FOREHEAD LESION (Left Head) EXCISION MASS HEAD (Left Head) SKIN GRAFT FULL THICKNESS (Left )  Patient Location: PACU  Anesthesia Type:General  Level of Consciousness: awake, alert  and oriented  Airway & Oxygen Therapy: Patient Spontanous Breathing and Patient connected to face mask oxygen  Post-op Assessment: Report given to RN, Post -op Vital signs reviewed and stable and Patient moving all extremities X 4  Post vital signs: Reviewed and stable  Last Vitals:  Vitals Value Taken Time  BP 121/63 03/10/2018  3:41 PM  Temp    Pulse 77 03/10/2018  3:48 PM  Resp 18 03/10/2018  3:48 PM  SpO2 100 % 03/10/2018  3:48 PM  Vitals shown include unvalidated device data.  Last Pain:  Vitals:   03/10/18 1001  TempSrc:   PainSc: 5       Patients Stated Pain Goal: 5 (14/70/92 9574)  Complications: No apparent anesthesia complications

## 2018-03-10 NOTE — Anesthesia Procedure Notes (Signed)
Procedure Name: Intubation Date/Time: 03/10/2018 11:05 AM Performed by: Genelle Bal, CRNA Pre-anesthesia Checklist: Patient identified, Emergency Drugs available, Suction available and Patient being monitored Patient Re-evaluated:Patient Re-evaluated prior to induction Oxygen Delivery Method: Circle system utilized Preoxygenation: Pre-oxygenation with 100% oxygen Induction Type: IV induction Ventilation: Mask ventilation without difficulty Laryngoscope Size: Miller and 2 Grade View: Grade I Tube type: Oral Tube size: 7.5 mm Number of attempts: 1 Airway Equipment and Method: Stylet and Oral airway Placement Confirmation: ETT inserted through vocal cords under direct vision,  positive ETCO2 and breath sounds checked- equal and bilateral Secured at: 21 cm Tube secured with: Tape Dental Injury: Teeth and Oropharynx as per pre-operative assessment

## 2018-03-10 NOTE — H&P (Addendum)
Randall Reyes is an 82 y.o. male.   Chief Complaint: cancer of the face and neck HPI: hx of SCCA of neck and large mass on forehead. He needs resection  Past Medical History:  Diagnosis Date  . Anemia   . B12 deficiency 12/07/2014  . CLL (chronic lymphocytic leukemia) ([place]) 01/24/2011  . Diabetes mellitus   . DM (diabetes mellitus) ([place]) 01/24/2011  . Eczema 01/24/2011  . History of pneumonia 04/2014  . Hypertension   . Leukemia ([place]) 6.22.2012   PER PATIENT  . Melanoma in situ ([place]) 01/24/2011  . Mild obesity 01/24/2011  . Pneumonia    2018  . Port catheter in place 05/02/2012  . Presence of permanent cardiac pacemaker    [employer]  . Shingles   . Ulcer     Past Surgical History:  Procedure Laterality Date  . bleeding ulcer    . CATARACT EXTRACTION W/PHACO  05/05/2012   Procedure: CATARACT EXTRACTION PHACO AND INTRAOCULAR LENS PLACEMENT (IOC);  Surgeon: Tonny Branch, MD;  Location: [facility];  Service: Ophthalmology;  Laterality: Right;  CDE:13.25  . CATARACT EXTRACTION W/PHACO  05/15/2012   Procedure: CATARACT EXTRACTION PHACO AND INTRAOCULAR LENS PLACEMENT (IOC);  Surgeon: Tonny Branch, MD;  Location: [facility];  Service: Ophthalmology;  Laterality: Left;  CDE:  12.32  . COLONOSCOPY N/A 01/30/2016   Procedure: COLONOSCOPY;  Surgeon: Danie Binder, MD;  [facility];  Service: Endoscopy;  Laterality: N/A;  2:00 PM - moved to 1:00 - office notified pt  . ESOPHAGOGASTRODUODENOSCOPY  2008   Dr. Oneida Alar: normal esophagus, antral erythema, 1 cm clean based duodenal ulcera, negative H. plyori  . ESOPHAGOGASTRODUODENOSCOPY N/A 01/30/2016   Procedure: ESOPHAGOGASTRODUODENOSCOPY (EGD);  Surgeon: Danie Binder, MD;  [facility];  Service: Endoscopy;  Laterality: N/A;  . EYE SURGERY    . HERNIA REPAIR  2001  . PACEMAKER IMPLANT N/A 02/07/2017   Procedure: PACEMAKER IMPLANT;  Surgeon: Constance Haw, MD;  Location: [facility] CV LAB;  Service: Cardiovascular;   Laterality: N/A;  . PORTACATH PLACEMENT  2008  . TONSILLECTOMY     removed around 63-32 years of age    Family History  Problem Relation Age of Onset  . Diabetes Father   . Colon cancer Neg Hx    Social History:  reports that he quit smoking about 40 years ago. He quit smokeless tobacco use about 50 years ago. He reports that he does not drink alcohol or use drugs.  Allergies:  Allergies  Allergen Reactions  . Aspirin Other (See Comments)    Bleeding ulcers.   . Ibuprofen Other (See Comments)    Bleeding ulcers.    No medications prior to admission.    No results found for this or any previous visit (from the past 48 hour(s)). No results found.  Review of Systems  Constitutional: Negative.   HENT: Negative.   Eyes: Negative.   Respiratory: Negative.   Cardiovascular: Negative.   Skin: Negative.     There were no vitals taken for this visit. Physical Exam  Constitutional: He appears well-developed and well-nourished.  HENT:  Head: Atraumatic.  Nose: Nose normal.  Mouth/Throat: Oropharynx is clear and moist.  Large mass on the left forehead and nodes visible on the preauricular area  Eyes: Pupils are equal, round, and reactive to light. Conjunctivae are normal.  Neck: Normal range of motion. Neck supple.  Cardiovascular: Normal rate.  Respiratory: Effort normal.  GI: Soft.  Musculoskeletal: Normal range  of motion.     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 He is ready to proceed  Melissa Montane, MD 03/10/2018, 7:28 AM

## 2018-03-10 NOTE — Progress Notes (Signed)
   ENT Progress Note:  s/p Procedure(s): LEFT SIDE PAROTIDECTOMTY AND NECK DISECTION WITH RESECTION OF A FOREHEAD LESION EXCISION MASS HEAD SKIN GRAFT FULL THICKNESS   Subjective: Pt stable, min pain   Objective: Vital signs in last 24 hours: Temp:  [98.3 F (36.8 C)-98.8 F (37.1 C)] 98.8 F (37.1 C) (10/28 1541) Pulse Rate:  [62-81] 77 (10/28 1700) Resp:  [11-18] 11 (10/28 1700) BP: (105-121)/(54-63) 110/62 (10/28 1656) SpO2:  [89 %-99 %] 98 % (10/28 1700) Weight:  [54.9 kg] 54.9 kg (10/28 0929) Weight change:     Intake/Output from previous day: No intake/output data recorded. Intake/Output this shift: Total I/O In: 1000 [I.V.:1000] Out: 700 [Urine:350; Blood:350]  Labs: Recent Labs    03/10/18 1544  WBC 32.3*  HGB 9.0*  HCT 29.3*  PLT 157   No results for input(s): NA, K, CL, CO2, GLUCOSE, BUN, CALCIUM in the last 72 hours.  Invalid input(s): CREATININR  Studies/Results: No results found.   PHYSICAL EXAM: Head dressing intact Inc intact - JP inplace   Assessment/Plan: Pt stable post op Transfer to floor    Jerrell Belfast 03/10/2018, 5:17 PM

## 2018-03-10 NOTE — Op Note (Signed)
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  Procedure: resection of forehead carcinomawith Acell  split thickness skin graft, left parotidectomy with excision of skin, left modified radical neck dissection, cervical facial advancement flap Anesthesia: Gen. Asst.: Rolena Infante Estimated blood loss: 500 mL Indications: A 82 year old with a large mass on his left for head that is exophytic and cauliflower looking. It bleeds excessively easily. He has had a biopsy of this which is squamous cell carcinoma. The frontal branch of his nerve is out and the eye closure is weak. He also has tumor in the skin, and out of the preauricular area and just at the level of the lobule. This is not fixed but very tight. Also he has multiple lymph nodes in his left neck. He was informed risk and benefits of the procedure and options were discussed all questions were answered and consent was obtained. He understands that it's very likely the facial nerve will have to be sacrificed.  Operation: Patient was taken out. Placed in supine position after general endotracheal tube anesthesia was prepped and draped in the usual sterile manner. The facial nerve monitor was good sensitivity. The forehead was started with a resection outlined with wide margin of about 2 cm around this exophytic mass. This was excised down to bone. It was sent for margins and the inferior margin was close and the lateral margin was positive. Another larger piece was resected. Hemostasis was achieved. The dura reduction was then carried to the neck where an incision was made around the tumor that was extending out of the skin and the preauricular area and a circumferential fashion and then the incision was extended down into the neck. The incision was then carried along the suboccipital area as he had a note in that region. The dissection was started by removing the fascia off the sternocleidomastoid  muscle. It was dissected medially and anteriorly and the spinal accessory nerve was identified. It was dissected and it did seem to pass under the tumor. It was tumor in the digastric region and underneath the sternocleidomastoid muscle extending posterior to the old posterior lateral portion of the neck. The posterior lateral neck dissection was performed taking the fascia group off of the muscle and directing it towards the jugular digastric region. The muscle was difficult to dissect the tumor properly from underneath so the muscle was divided and the dissection was carried under the muscle and under the spinal accessory nerve didn't inferiorly. All of the masses were present in the dissected specimen. The neck dissection was then taken off the jugular vein and dissection was carried out toward the submandibular area. The digastric was identified both anterior and posterior. These hypoglossal nerves identified and left intact. Dissection was carried along the posterior belly of digastric up to the mastoid. Dissection was then carried out down along the temporomastoid suture line dissecting carefully down to the facial nerve. The trunk of the nerve was identified. It was dissected superiorly and right at the pes the upper branch was encased in tumor. The inferior branch was dissected and the upper branch was divided and the inferior branch was preserved. Dissection was then carried out removing the tumor which seem to be free of the masseter muscle which was dissected on top of the muscle and then the tumor dissected nicely with the deep lobe of the parotid gland. The posterior fascial artery was clamped and divided as the dissection was carried superiorly in the posterior aspect of  the digastric. The skin and specimen were sent with the neck dissection as a permanent specimen. The spinal accessory nerve was preserved as well as a portion of the facial nerve. The carotid and vagus nerve were identified and  preserved. Hypoglossal nerve was also seen and not dissected and left intact. A cervical facial flap was then created dissecting the inferior flap in a subcutaneous fashion inferiorly and laterally. With extensive undermining the flap was brought up to the superior aspect of the skin resection in the preauricular area. It seemed to have very little tension so this was used for the closure. It was sutured with 3-0 chromic and then the rest of the incision was closed with 3-0 chromic once no tension was identified on the superior aspect of the flap. The incision extending toward the eye was closed with a running 5-0 nylon. A #10 drain was placed inferiorly. It was secured with a 3-0 nylon.the rest of the incision was closed with skin staples. The defect in the 4 head area was then addressed and a cell was positioned cram. The power was placed under the graft. He was then secured into position with mesh and then K-Y jelly and gauze was placed over this. He was then awakened brought to recovery in stable condition counts correct

## 2018-03-10 NOTE — Anesthesia Postprocedure Evaluation (Signed)
Anesthesia Post Note  Patient: BRENNEN CAMPER  Procedure(s) Performed: LEFT SIDE PAROTIDECTOMTY AND NECK DISECTION WITH RESECTION OF A FOREHEAD LESION (Left Head) EXCISION MASS HEAD (Left Head) SKIN GRAFT FULL THICKNESS (Left )     Patient location during evaluation: PACU Anesthesia Type: General Level of consciousness: awake and alert Pain management: pain level controlled Vital Signs Assessment: post-procedure vital signs reviewed and stable Respiratory status: spontaneous breathing, nonlabored ventilation, respiratory function stable and patient connected to nasal cannula oxygen Cardiovascular status: blood pressure returned to baseline and stable Postop Assessment: no apparent nausea or vomiting Anesthetic complications: no    Last Vitals:  Vitals:   03/10/18 1614 03/10/18 1615  BP: (!) 105/54   Pulse: 81 78  Resp: 12 11  Temp:    SpO2: (!) 89% 91%    Last Pain:  Vitals:   03/10/18 1541  TempSrc:   PainSc: 0-No pain                 Brennan Bailey

## 2018-03-11 ENCOUNTER — Encounter (HOSPITAL_COMMUNITY): Payer: Self-pay | Admitting: Otolaryngology

## 2018-03-11 MED ORDER — LEVETIRACETAM 100 MG/ML PO SOLN
100.0000 mg | Freq: Two times a day (BID) | ORAL | Status: DC
  Administered 2018-03-11 (×2): 100 mg via ORAL
  Filled 2018-03-11 (×4): qty 2.5

## 2018-03-11 NOTE — Progress Notes (Signed)
1 Day Post-Op   Subjective/Chief Complaint: He feels great    Objective: Vital signs in last 24 hours: Temp:  [97.6 F (36.4 C)-98.7 F (37.1 C)] 98.7 F (37.1 C) (10/29 1359) Pulse Rate:  [59-77] 61 (10/29 1359) Resp:  [11-16] 14 (10/29 1359) BP: (94-117)/(52-67) 105/53 (10/29 1359) SpO2:  [92 %-99 %] 97 % (10/29 1359) Last BM Date: 03/09/18  Intake/Output from previous day: 10/28 0701 - 10/29 0700 In: 2160 [I.V.:2160] Out: 1575 [Urine:1200; Drains:25; Blood:350] Intake/Output this shift: Total I/O In: -  Out: 450 [Urine:450]  awake alert. talking well. dressing intact. neck looks excellent. flap viable. cv regular lungs clear. ext no tenderness  Lab Results:  Recent Labs    03/10/18 1544  WBC 32.3*  HGB 9.0*  HCT 29.3*  PLT 157   BMET No results for input(s): NA, K, CL, CO2, GLUCOSE, BUN, CREATININE, CALCIUM in the last 72 hours. PT/INR No results for input(s): LABPROT, INR in the last 72 hours. ABG No results for input(s): PHART, HCO3 in the last 72 hours.  Invalid input(s): PCO2, PO2  Studies/Results: No results found.  Anti-infectives: Anti-infectives (From admission, onward)   Start     Dose/Rate Route Frequency Ordered Stop   03/10/18 2000  ceFAZolin (ANCEF) IVPB 1 g/50 mL premix     1 g 100 mL/hr over 30 Minutes Intravenous Every 12 hours 03/10/18 1729        Assessment/Plan: s/p Procedure(s): LEFT SIDE PAROTIDECTOMTY AND NECK DISECTION WITH RESECTION OF A FOREHEAD LESION (Left) EXCISION MASS HEAD (Left) SKIN GRAFT FULL THICKNESS (Left) He is doing well. change dresssing tomorrow. jp drain still in place. dc foley. ambualte and regular diet.   LOS: 1 day    Melissa Montane 03/11/2018

## 2018-03-12 MED ORDER — LEVETIRACETAM 100 MG/ML PO SOLN
750.0000 mg | Freq: Two times a day (BID) | ORAL | Status: DC
  Administered 2018-03-12: 750 mg via ORAL
  Filled 2018-03-12: qty 7.5

## 2018-03-12 MED ORDER — CEPHALEXIN 500 MG PO CAPS: 500.0000 mg | ORAL_CAPSULE | Freq: Three times a day (TID) | ORAL | 0 refills | Status: DC

## 2018-03-12 NOTE — Plan of Care (Signed)
  Problem: Education: Goal: Knowledge of General Education information will improve Description Including pain rating scale, medication(s)/side effects and non-pharmacologic comfort measures Outcome: Progressing Note:  POC reviewed with pt./wife.

## 2018-03-12 NOTE — Progress Notes (Signed)
Patient discharged to home. Patient's spouse verbalizes understanding of all discharge instructions including incision care, discharge medications, and follow up MD visits.

## 2018-03-12 NOTE — Discharge Summary (Signed)
Physician Discharge Summary  Patient ID: Randall Reyes MRN: [id number] DOB/AGE: Oct 28, 1935 82 y.o.  Admit date: 03/10/2018 Discharge date: 03/12/2018  Admission Diagnoses:squamous cell carcinoma of the scalp and neck  Discharge Diagnoses: same Active Problems:   Cancer of scalp or skin of neck   Discharged Condition: good  Hospital Course: patient was admitted to the hospital after undergoing a skin graft to the for head after resection of a large tumor, left neck dissection, left parotidectomy with skin removal. He did well postoperatively. He is neck looked excellent. He had about 30 mL of drainage on postop day 2 and neck look excellent. Decided that discharge was appropriate and the family wanted to do so. There were good with taking care of the JP drain. I change the dressing of the skin graft and put more K-Y jelly. It had no evidence of infection. He's taking food well. He was discharged to follow-up on Friday for wound dressing change Wyvonnia Lora bringing the measurements of the JP drainage. Consults: None  Significant Diagnostic Studies: none  Treatments: as above  Discharge Exam: Blood pressure (!) 100/50, pulse 82, temperature 99.5 F (37.5 C), temperature source Oral, resp. rate 16, height 5\' 1"  (1.549 m), weight 54.9 kg, SpO2 93 %. patient is awake and alert. The wounds of the head looked excellent.. JP is still in position. Heart is regular. Lungs are clear. extremities with no swelling or tenderness.  Disposition:    Patient is discharged to home and will follow-up on Friday for dressing change and JP removal. The patient and wife were shown how to empty the JP. They will record the amount of drainage. He will be discharged on Keflex. He doesn't seem to be having any pain that needs narcotics. They will leave the skin graft dressing intact.   Signed: Melissa Montane 03/12/2018, 8:16 AM

## 2018-03-13 LAB — CUP PACEART REMOTE DEVICE CHECK
Battery Voltage: 3.03 V
Brady Statistic RA Percent Paced: 0 %
Implantable Lead Implant Date: 20180927
Implantable Lead Location: 753860
Implantable Lead Model: 5076
Implantable Pulse Generator Implant Date: 20180927
Lead Channel Impedance Value: 266 Ohm
Lead Channel Impedance Value: 304 Ohm
Lead Channel Impedance Value: 342 Ohm
Lead Channel Pacing Threshold Pulse Width: 0.4 ms
Lead Channel Sensing Intrinsic Amplitude: 1.25 mV
Lead Channel Sensing Intrinsic Amplitude: 1.375 mV
Lead Channel Sensing Intrinsic Amplitude: 12.5 mV
Lead Channel Setting Pacing Amplitude: 2.5 V
MDC IDC LEAD IMPLANT DT: 20180927
MDC IDC LEAD LOCATION: 753859
MDC IDC MSMT BATTERY REMAINING LONGEVITY: 127 mo
MDC IDC MSMT LEADCHNL RV IMPEDANCE VALUE: 304 Ohm
MDC IDC MSMT LEADCHNL RV PACING THRESHOLD AMPLITUDE: 0.5 V
MDC IDC MSMT LEADCHNL RV SENSING INTR AMPL: 12.5 mV
MDC IDC SESS DTM: 20191008194905
MDC IDC SET LEADCHNL RV PACING PULSEWIDTH: 0.4 ms
MDC IDC SET LEADCHNL RV SENSING SENSITIVITY: 1.2 mV
MDC IDC STAT BRADY AP VP PERCENT: 0 %
MDC IDC STAT BRADY AP VS PERCENT: 0 %
MDC IDC STAT BRADY AS VP PERCENT: 85.59 %
MDC IDC STAT BRADY AS VS PERCENT: 14.41 %
MDC IDC STAT BRADY RV PERCENT PACED: 85.59 %

## 2018-03-13 LAB — TYPE AND SCREEN
ABO/RH(D): AB POS
Antibody Screen: NEGATIVE
UNIT DIVISION: 0
Unit division: 0

## 2018-03-13 LAB — BPAM RBC
BLOOD PRODUCT EXPIRATION DATE: 201911122359
BLOOD PRODUCT EXPIRATION DATE: 201911262359
ISSUE DATE / TIME: 201910301111
Unit Type and Rh: 6200
Unit Type and Rh: 8400

## 2018-03-15 ENCOUNTER — Emergency Department (HOSPITAL_COMMUNITY)
Admission: EM | Admit: 2018-03-15 | Discharge: 2018-03-15 | Disposition: A | Discharge status: Home / Self Care | Payer: Medicare Other | Attending: Emergency Medicine | Admitting: Emergency Medicine

## 2018-03-15 ENCOUNTER — Encounter (HOSPITAL_COMMUNITY): Payer: Self-pay

## 2018-03-15 ENCOUNTER — Other Ambulatory Visit: Payer: Self-pay

## 2018-03-15 DIAGNOSIS — X58XXXA Exposure to other specified factors, initial encounter: Secondary | ICD-10-CM | POA: Diagnosis not present

## 2018-03-15 DIAGNOSIS — Z4800 Encounter for change or removal of nonsurgical wound dressing: Secondary | ICD-10-CM | POA: Diagnosis not present

## 2018-03-15 DIAGNOSIS — Z7984 Long term (current) use of oral hypoglycemic drugs: Secondary | ICD-10-CM | POA: Insufficient documentation

## 2018-03-15 DIAGNOSIS — Z5189 Encounter for other specified aftercare: Secondary | ICD-10-CM

## 2018-03-15 DIAGNOSIS — Z79899 Other long term (current) drug therapy: Secondary | ICD-10-CM | POA: Diagnosis not present

## 2018-03-15 DIAGNOSIS — Z85828 Personal history of other malignant neoplasm of skin: Secondary | ICD-10-CM | POA: Insufficient documentation

## 2018-03-15 DIAGNOSIS — Z87891 Personal history of nicotine dependence: Secondary | ICD-10-CM | POA: Diagnosis not present

## 2018-03-15 DIAGNOSIS — N183 Chronic kidney disease, stage 3 (moderate): Secondary | ICD-10-CM | POA: Diagnosis not present

## 2018-03-15 DIAGNOSIS — E1122 Type 2 diabetes mellitus with diabetic chronic kidney disease: Secondary | ICD-10-CM | POA: Diagnosis not present

## 2018-03-15 DIAGNOSIS — Z950 Presence of cardiac pacemaker: Secondary | ICD-10-CM | POA: Diagnosis not present

## 2018-03-15 DIAGNOSIS — S1181XD Laceration without foreign body of other specified part of neck, subsequent encounter: Secondary | ICD-10-CM | POA: Diagnosis not present

## 2018-03-15 DIAGNOSIS — I129 Hypertensive chronic kidney disease with stage 1 through stage 4 chronic kidney disease, or unspecified chronic kidney disease: Secondary | ICD-10-CM | POA: Insufficient documentation

## 2018-03-15 DIAGNOSIS — S0181XD Laceration without foreign body of other part of head, subsequent encounter: Secondary | ICD-10-CM | POA: Insufficient documentation

## 2018-03-15 NOTE — ED Triage Notes (Signed)
Pt presents today from home for a wound check where a drainage tube on left side of neck fell out while Pt was at home. Pt had cancerous mass removed on Monday by Dr Janace Hoard and drainage tube was placed. Pt A+O X4. Pt's wife and son report there was minimal drainage today and the tube fell out apprx. 30 mins ago.

## 2018-03-15 NOTE — Discharge Instructions (Addendum)
Change the dressing as needed.  Keep your appointment with Dr. Janace Hoard for Monday.  Return to the ER for any worsening symptoms such as fever, difficulty swallowing, increased swelling or redness of your neck.

## 2018-03-17 NOTE — ED Provider Notes (Signed)
[facility] EMERGENCY DEPARTMENT Provider Note   CSN: [id number] Arrival date & time: 03/15/18  2152     History   Chief Complaint Chief Complaint  Patient presents with  . Wound Check    HPI Randall Reyes is a 82 y.o. male.  HPI  Randall Reyes is a 82 y.o. male who presents to the Emergency Department requesting evaluation for a wound check.  He states that he had surgery to remove a cancerous mass from his left neck and face on 03/10/2018.  He states that a JP drain was placed in the wound.  Patient significant other states the drain "fell out" prior to arrival.  She notes there has been minimal drainage.  Family states that he has a follow-up appointment with his surgeon for Monday.  Patient spouse is concerned there is some swelling to the area where the drain was placed that she did not notice previously.  Patient denies significant pain, difficulty swallowing, fever or chills.   Past Medical History:  Diagnosis Date  . Anemia   . B12 deficiency 12/07/2014  . Chronic kidney disease    CKD III  . CLL (chronic lymphocytic leukemia) ([place]) 01/24/2011  . Diabetes mellitus   . DM (diabetes mellitus) ([place]) 01/24/2011  . Eczema 01/24/2011  . History of pneumonia 04/2014  . Hypertension   . Leukemia ([place]) 6.22.2012   PER PATIENT  . Melanoma in situ ([place]) 01/24/2011  . Mild obesity 01/24/2011  . Pneumonia    2018  . Port catheter in place 05/02/2012  . Presence of permanent cardiac pacemaker    [employer]  . Shingles   . Ulcer     Patient Active Problem List   Diagnosis Date Noted  . Cancer of scalp or skin of neck 03/10/2018  . Sepsis ([place]) 10/08/2017  . Pacemaker 10/04/2017  . CKD (chronic kidney disease), stage III ([place]) 10/04/2017  . Thrombocytopenia ([place]) 10/04/2017  . Facial abscess 10/04/2017  . Typical atrial flutter ([place])   . Bradycardia   . Cardiopulmonary arrest ([place])   . Acute respiratory failure ([place])   . Metabolic acidosis   . Encounter for  central line placement   . Encounter for imaging study to confirm orogastric (OG) tube placement   . Syncope 01/28/2017  . Tumor lysis syndrome 01/28/2017  . Bigeminy 05/23/2016  . Goals of care, counseling/discussion 05/22/2016  . Anemia 01/20/2016  . Special screening for malignant neoplasms, colon 01/20/2016  . HTN (hypertension) 02/01/2015  . B12 deficiency 12/07/2014  . Malnutrition of moderate degree ([place]) 08/11/2014  . HCAP (healthcare-associated pneumonia) 08/10/2014  . AKI (acute kidney injury) ([place]) 08/10/2014  . Hyperkalemia 08/10/2014  . Leukocytosis 1Aug 25, 202015  . Port catheter in place 05/02/2012  . CLL (chronic lymphocytic leukemia) ([place]) 01/24/2011  . Melanoma in situ ([place]) 01/24/2011  . DM (diabetes mellitus) ([place]) 01/24/2011  . Mild obesity 01/24/2011  . Eczema 01/24/2011    Past Surgical History:  Procedure Laterality Date  . bleeding ulcer    . CATARACT EXTRACTION W/PHACO  05/05/2012   Procedure: CATARACT EXTRACTION PHACO AND INTRAOCULAR LENS PLACEMENT (IOC);  Surgeon: Tonny Branch, MD;  Location: [facility];  Service: Ophthalmology;  Laterality: Right;  CDE:13.25  . CATARACT EXTRACTION W/PHACO  05/15/2012   Procedure: CATARACT EXTRACTION PHACO AND INTRAOCULAR LENS PLACEMENT (IOC);  Surgeon: Tonny Branch, MD;  Location: [facility];  Service: Ophthalmology;  Laterality: Left;  CDE:  12.32  . COLONOSCOPY N/A 01/30/2016   Procedure: COLONOSCOPY;  Surgeon: Marga Melnick  Fields, MD;  Location: [facility];  Service: Endoscopy;  Laterality: N/A;  2:00 PM - moved to 1:00 - office notified pt  . ESOPHAGOGASTRODUODENOSCOPY  2008   Dr. Oneida Alar: normal esophagus, antral erythema, 1 cm clean based duodenal ulcera, negative H. plyori  . ESOPHAGOGASTRODUODENOSCOPY N/A 01/30/2016   Procedure: ESOPHAGOGASTRODUODENOSCOPY (EGD);  Surgeon: Danie Binder, MD;  Location: [facility];  Service: Endoscopy;  Laterality: N/A;  . EXCISION MASS HEAD Left 03/10/2018   Procedure: EXCISION MASS HEAD;   Surgeon: Melissa Montane, MD;  Location: [place];  Service: ENT;  Laterality: Left;  . EYE SURGERY    . HERNIA REPAIR  2001  . PACEMAKER IMPLANT N/A 02/07/2017   Procedure: PACEMAKER IMPLANT;  Surgeon: Constance Haw, MD;  Location: [place] [facility];  Service: Cardiovascular;  Laterality: N/A;  . PAROTIDECTOMY Left 03/10/2018   LEFT SIDE PAROTIDECTOMTY AND NECK DISECTION WITH RESECTION OF A FOREHEAD LESION  . PAROTIDECTOMY Left 03/10/2018   Procedure: LEFT SIDE PAROTIDECTOMTY AND NECK DISECTION WITH RESECTION OF A FOREHEAD LESION;  Surgeon: Melissa Montane, MD;  Location: [place];  Service: ENT;  Laterality: Left;  . PORTACATH PLACEMENT  2008  . SKIN FULL THICKNESS GRAFT Left 03/10/2018   Procedure: SKIN GRAFT FULL THICKNESS;  Surgeon: Melissa Montane, MD;  Location: [place];  Service: ENT;  Laterality: Left;  . TONSILLECTOMY     removed around 21-58 years of age        Home Medications    Prior to Admission medications   Medication Sig Start Date End Date Taking? Authorizing Provider  acetaminophen (TYLENOL) 500 MG tablet Take 500 mg by mouth every 6 (six) hours as needed for moderate pain or headache.    [provider]  allopurinol (ZYLOPRIM) 100 MG tablet Take 1 tablet (100 mg total) by mouth daily. 11/01/17   Derek Jack, MD  cephALEXin (KEFLEX) 500 MG capsule Take 1 capsule (500 mg total) by mouth 3 (three) times daily. 03/12/18   Melissa Montane, MD  cholecalciferol (VITAMIN D) 400 units TABS tablet Take 400 Units by mouth daily.    [provider]  docusate sodium (COLACE) 100 MG capsule Take 100 mg by mouth daily as needed for mild constipation.     [provider]  feeding supplement, ENSURE ENLIVE, (ENSURE ENLIVE) LIQD Take 237 mLs by mouth 2 (two) times daily between meals. Patient taking differently: Take 237 mLs by mouth daily.  10/08/17   Dhungel, Nishant, MD  ibrutinib (IMBRUVICA) 140 MG capsul Take 2 capsules (280 mg total) by mouth daily. 01/30/18    Derek Jack, MD  levETIRAcetam (KEPPRA) 100 MG/ML solution Take 7.5 mLs (750 mg total) by mouth 2 (two) times daily. 02/08/17   Nita Sells, MD  rosuvastatin (CRESTOR) 40 MG tablet Take 40 mg by mouth at bedtime.  02/21/16   [provider]  sitaGLIPtin (JANUVIA) 100 MG tablet Take 100 mg by mouth daily.    [provider]  sodium bicarbonate 650 MG tablet Take 650 mg by mouth 2 (two) times daily.  04/24/16   [provider]  vitamin C (ASCORBIC ACID) 500 MG tablet Take 500 mg by mouth daily.    [provider]    Family History Family History  Problem Relation Age of Onset  . Diabetes Father   . Colon cancer Neg Hx     Social History Social History   Tobacco Use  . Smoking status: Former Smoker    Last attempt to  quit: 09/11/1977    Years since quitting: 40.5  . Smokeless tobacco: Former Systems developer    Quit date: 04/25/1967  Substance Use Topics  . Alcohol use: No  . Drug use: No     Allergies   Aspirin and Ibuprofen    Review of Systems Review of Systems Consitutional: Negative for fever and chills HEENT:  Surgical incision to left face and neck with mild drainage Musculoskeletal:  No spinal tenderness Neurological: Negative for weakness and numbness  Physical Exam Updated Vital Signs BP (!) 104/50 (BP Location: Right Arm)   Pulse 81   Temp 98 F (36.7 C) (Oral)   Resp 18   Ht 5\' 1"  (1.549 m)   Wt 54.9 kg   SpO2 96%   BMI 22.86 kg/m   Physical Exam  Constitutional: He is oriented to person, place, and time. He appears well-developed and well-nourished. No distress.  HENT:  Head: Normocephalic.  Healing surgical incision to the left neck, face and preauricular area.  Staples intact.  Minimal serous drainage at site of the previous drain placement.  No purulence or blood.  No fluctuance or crepitus.   Cardiovascular: Normal rate, regular rhythm, normal heart sounds and intact distal pulses.  No murmur  heard. Pulmonary/Chest: Effort normal and breath sounds normal. No respiratory distress.  Musculoskeletal: He exhibits no edema or tenderness.  Neurological: He is alert and oriented to person, place, and time. He exhibits normal muscle tone. Coordination normal.  Skin: Skin is warm and dry  Nursing note and vitals reviewed.    Pt agrees to digital image to be placed in the chart and understands this is for medical purpose only and no images will be stored on any devices.    ED Treatments / Results  Labs (all labs ordered are listed, but only abnormal results are displayed) Labs Reviewed - No data to display  EKG None  Radiology No results found.  Procedures Procedures (including critical care time)  Medications Ordered in ED Medications - No data to display   Initial Impression / Assessment and Plan / ED Course  I have reviewed the triage vital signs and the nursing notes.  Pertinent labs & imaging results that were available during my care of the patient were reviewed by me and considered in my medical decision making (see chart for details).     Pt is well appearing.  Nontoxic.  Vitals reassuring.  Here for concern after JP drain fell out.  Of note,  discharge instructions by Dr. Janace Hoard, the patient was supposed to follow-up on Friday for drain removal.  Family states he has an appointment for Monday.  Wound appears to be healing well  Minimal swelling noted along the incision, but this is not felt to be related to infection.  Discussed with Dr. Laverta Baltimore and patient's family.  Family is in agreement to observe closely and ER return if he develops pain, fever, or the swelling increases.  Final Clinical Impressions(s) / ED Diagnoses   Final diagnoses:  Visit for wound check    ED Discharge Orders    None       Kem Parkinson, PA-C 03/17/18 0133    Long, Wonda Olds, MD 03/17/18 [phone]

## 2018-03-18 ENCOUNTER — Encounter (HOSPITAL_COMMUNITY): Payer: Self-pay | Admitting: Otolaryngology

## 2018-03-19 ENCOUNTER — Ambulatory Visit (HOSPITAL_COMMUNITY): Payer: Medicare Other | Admitting: Hematology

## 2018-03-19 ENCOUNTER — Other Ambulatory Visit (HOSPITAL_COMMUNITY): Payer: Medicare Other

## 2018-03-24 ENCOUNTER — Other Ambulatory Visit (HOSPITAL_COMMUNITY): Payer: Self-pay | Admitting: *Deleted

## 2018-03-24 ENCOUNTER — Telehealth (HOSPITAL_COMMUNITY): Payer: Self-pay | Admitting: *Deleted

## 2018-03-24 MED ORDER — IBRUTINIB 140 MG PO CAPS: 280.0000 mg | ORAL_CAPSULE | Freq: Every day | ORAL | 0 refills | Status: DC

## 2018-03-24 NOTE — Telephone Encounter (Signed)
Chart reviewed and ibrutinib refilled based on last office note.

## 2018-03-25 ENCOUNTER — Inpatient Hospital Stay (HOSPITAL_COMMUNITY): Payer: Medicare Other

## 2018-03-25 ENCOUNTER — Inpatient Hospital Stay (HOSPITAL_COMMUNITY): Payer: Medicare Other | Attending: Hematology

## 2018-03-25 ENCOUNTER — Ambulatory Visit (HOSPITAL_COMMUNITY): Payer: Medicare Other | Admitting: Hematology

## 2018-03-25 VITALS — BP 96/56 | HR 90 | Temp 97.5°F | Resp 16

## 2018-03-25 DIAGNOSIS — Z992 Dependence on renal dialysis: Secondary | ICD-10-CM | POA: Insufficient documentation

## 2018-03-25 DIAGNOSIS — Z87891 Personal history of nicotine dependence: Secondary | ICD-10-CM | POA: Diagnosis not present

## 2018-03-25 DIAGNOSIS — C9110 Chronic lymphocytic leukemia of B-cell type not having achieved remission: Secondary | ICD-10-CM | POA: Insufficient documentation

## 2018-03-25 DIAGNOSIS — Z9221 Personal history of antineoplastic chemotherapy: Secondary | ICD-10-CM | POA: Insufficient documentation

## 2018-03-25 DIAGNOSIS — Z79899 Other long term (current) drug therapy: Secondary | ICD-10-CM | POA: Insufficient documentation

## 2018-03-25 DIAGNOSIS — N179 Acute kidney failure, unspecified: Secondary | ICD-10-CM | POA: Diagnosis not present

## 2018-03-25 DIAGNOSIS — E538 Deficiency of other specified B group vitamins: Secondary | ICD-10-CM

## 2018-03-25 LAB — COMPREHENSIVE METABOLIC PANEL
ALBUMIN: 2.7 g/dL — AB (ref 3.5–5.0)
ALT: 134 U/L — AB (ref 0–44)
AST: 176 U/L — AB (ref 15–41)
Alkaline Phosphatase: 170 U/L — ABNORMAL HIGH (ref 38–126)
Anion gap: 7 (ref 5–15)
BUN: 29 mg/dL — AB (ref 8–23)
CHLORIDE: 99 mmol/L (ref 98–111)
CO2: 30 mmol/L (ref 22–32)
CREATININE: 2.28 mg/dL — AB (ref 0.61–1.24)
Calcium: 8.4 mg/dL — ABNORMAL LOW (ref 8.9–10.3)
GFR calc Af Amer: 29 mL/min — ABNORMAL LOW (ref >60)
GFR calc non Af Amer: 25 mL/min — ABNORMAL LOW (ref >60)
Glucose, Bld: 179 mg/dL — ABNORMAL HIGH (ref 70–99)
POTASSIUM: 3.6 mmol/L (ref 3.5–5.1)
Sodium: 136 mmol/L (ref 135–145)
Total Bilirubin: 0.6 mg/dL (ref 0.3–1.2)
Total Protein: 6.7 g/dL (ref 6.5–8.1)

## 2018-03-25 LAB — CBC WITH DIFFERENTIAL/PLATELET
ABS IMMATURE GRANULOCYTES: 0.08 10*3/uL — AB (ref 0.00–0.07)
BASOS PCT: 0 %
Basophils Absolute: 0.1 10*3/uL (ref 0.0–0.1)
Eosinophils Absolute: 0 10*3/uL (ref 0.0–0.5)
Eosinophils Relative: 0 %
HEMATOCRIT: 28.7 % — AB (ref 39.0–52.0)
Hemoglobin: 8.6 g/dL — ABNORMAL LOW (ref 13.0–17.0)
IMMATURE GRANULOCYTES: 0 %
LYMPHS PCT: 89 %
Lymphs Abs: 24.3 10*3/uL — ABNORMAL HIGH (ref 0.7–4.0)
MCH: 34.4 pg — ABNORMAL HIGH (ref 26.0–34.0)
MCHC: 30 g/dL (ref 30.0–36.0)
MCV: 114.8 fL — AB (ref 80.0–100.0)
MONO ABS: 0.2 10*3/uL (ref 0.1–1.0)
MONOS PCT: 1 %
NEUTROS ABS: 2.7 10*3/uL (ref 1.7–7.7)
NEUTROS PCT: 10 %
PLATELETS: 226 10*3/uL (ref 150–400)
RBC: 2.5 MIL/uL — AB (ref 4.22–5.81)
RDW: 14.6 % (ref 11.5–15.5)
WBC: 27.4 10*3/uL — ABNORMAL HIGH (ref 4.0–10.5)
nRBC: 0 % (ref 0.0–0.2)

## 2018-03-25 LAB — URIC ACID: URIC ACID, SERUM: 2.5 mg/dL — AB (ref 3.7–8.6)

## 2018-03-25 LAB — MAGNESIUM: MAGNESIUM: 1.9 mg/dL (ref 1.7–2.4)

## 2018-03-25 LAB — LACTATE DEHYDROGENASE: LDH: 185 U/L (ref 98–192)

## 2018-03-25 LAB — PHOSPHORUS: Phosphorus: 2.7 mg/dL (ref 2.5–4.6)

## 2018-03-25 MED ORDER — CYANOCOBALAMIN 1000 MCG/ML IJ SOLN
1000.0000 ug | Freq: Once | INTRAMUSCULAR | Status: AC
  Administered 2018-03-25: 1000 ug via INTRAMUSCULAR

## 2018-03-25 MED ORDER — CYANOCOBALAMIN 1000 MCG/ML IJ SOLN
INTRAMUSCULAR | Status: AC
  Filled 2018-03-25: qty 1

## 2018-03-25 NOTE — Progress Notes (Signed)
Pt presents to the [facility] for b12 injection and lab draw with appt with Dr. Delton Coombes that was rescheduled for tomorrow due to pt late. VSS. No complaints.    Discharged from clinic ambulatory. F/U with [facility] as scheduled.

## 2018-03-25 NOTE — Patient Instructions (Signed)
[place] at [facility]  Discharge Instructions:  b12 injection  _______________________________________________________________  Thank you for choosing [place] at [facility] to provide your oncology and hematology care.  To afford each patient quality time with our providers, please arrive at least 15 minutes before your scheduled appointment.  You need to re-schedule your appointment if you arrive 10 or more minutes late.  We strive to give you quality time with our providers, and arriving late affects you and other patients whose appointments are after yours.  Also, if you no show three or more times for appointments you may be dismissed from the clinic.  Again, thank you for choosing [place] at [place] hope is that these requests will allow you access to exceptional care and in a timely manner. _______________________________________________________________  If you have questions after your visit, please contact our office at [phone] between the hours of 8:30 a.m. and 5:00 p.m. Voicemails left after 4:30 p.m. will not be returned until the following business day. _______________________________________________________________  For prescription refill requests, have your pharmacy contact our office. _______________________________________________________________  Recommendations made by the consultant and any test results will be sent to your referring physician. _______________________________________________________________

## 2018-03-26 ENCOUNTER — Other Ambulatory Visit: Payer: Self-pay

## 2018-03-26 ENCOUNTER — Inpatient Hospital Stay (HOSPITAL_BASED_OUTPATIENT_CLINIC_OR_DEPARTMENT_OTHER): Payer: Medicare Other | Admitting: Hematology

## 2018-03-26 ENCOUNTER — Encounter (HOSPITAL_COMMUNITY): Payer: Self-pay | Admitting: Lab

## 2018-03-26 ENCOUNTER — Encounter (HOSPITAL_COMMUNITY): Payer: Self-pay | Admitting: Hematology

## 2018-03-26 DIAGNOSIS — C9110 Chronic lymphocytic leukemia of B-cell type not having achieved remission: Secondary | ICD-10-CM | POA: Diagnosis not present

## 2018-03-26 DIAGNOSIS — Z992 Dependence on renal dialysis: Secondary | ICD-10-CM

## 2018-03-26 DIAGNOSIS — Z9221 Personal history of antineoplastic chemotherapy: Secondary | ICD-10-CM

## 2018-03-26 DIAGNOSIS — N179 Acute kidney failure, unspecified: Secondary | ICD-10-CM

## 2018-03-26 DIAGNOSIS — Z79899 Other long term (current) drug therapy: Secondary | ICD-10-CM | POA: Diagnosis not present

## 2018-03-26 DIAGNOSIS — Z87891 Personal history of nicotine dependence: Secondary | ICD-10-CM | POA: Diagnosis not present

## 2018-03-26 NOTE — Progress Notes (Signed)
[name], [place] [address]   CLINIC:  Medical Oncology/Hematology  PCP:  [name], MD [place] [address] [phone]   REASON FOR VISIT: Follow-up for Chronic lymphocytic leukemia  CURRENT THERAPY: Ibrutinib 280 mg daily  BRIEF ONCOLOGIC HISTORY:    CLL (chronic lymphocytic leukemia) ([facility])   02/14/2005 Procedure    Peripheral flow cytometry- CLL (without 11q or 17 p deletion) presenting with stage 0 disease    10/25/2008 Progression    Increasing WBC and symptoms suggestive of progressive disease    11/09/2008 - 01/07/2009 Chemotherapy    Rituxan, fludarabine, cytoxan x 3 cycles with the discontinuation of fludarabine and cytoxan due to severe pancytopenia requiring hospitalization.    01/10/2009 Adverse Reaction    Severe pancytopenia and adverse side effects    02/01/2009 - 04/01/2009 Chemotherapy    Rituxan every 21 days x 3 cycles    04/02/2009 Remission    Stable disease.  Chemotherapy holiday started    06/22/2013 - 06/29/2013 Chemotherapy    Imbruvica daily    06/29/2013 Adverse Reaction    Large right posterior leg (popliteal) ecchymosis/violaceous rash.  Likely Imbruvica-induced    07/31/2013 - 10/12/2013 Chemotherapy    Zydelig (idelalisib) 150 mg BID monotherapy    10/12/2013 Adverse Reaction    Transaminitis, suspected to be secondary to [place].  Dose decreased.    10/12/2013 - 11/09/2013 Chemotherapy    Zydelig (idelalisib) 150 mg daily.    11/09/2013 Adverse Reaction    Worsening transaminitis.  Zydelig on HOLD    11/09/2013 Treatment Plan Change    Hold Zydelig    12/28/2013 Adverse Reaction    Herpes Zoster outbreak.  Partial response with Famvir x 1 week, switched to acyclovir.  Progression over 1-2 weeks with development of keratoconjunctivitis bilaterally.  VZ Immune Globulin 625 mg IM given on 01/13/14.    02/08/2014 Treatment Plan Change    Ibrutinib restarted 140 mg daily for one week  followed by 280 mg daily followed by 420 mg daily with prophylactic acyclovir.    02/08/2014 - 07/05/2014 Chemotherapy    Ibrutinib 420 mg daily    102/05/2013 - 04/17/2014 Hospital Admission    Right lower lobe pneumonia    07/05/2014 Adverse Reaction    Fissuring of fingertips and sore, red feet with transaminitis    07/05/2014 Treatment Plan Change    Hold Ibrutinib    07/22/2014 - 08/19/2014 Chemotherapy    Ibrutinib restarted at 280 mg daily.    08/09/2014 - 08/12/2014 Hospital Admission    Bilateral pneumonia    08/19/2014 Adverse Reaction    Nausea with vomiting.  Escorted to ED.    08/19/2014 Treatment Plan Change    Ibrutinib on hold- ED escort from clinic     10/18/2014 Miscellaneous    B12 weekly x 4 then monthly.    12/15/2015 Imaging    Bone density- BMD as determined from Femur Neck Left is 0.803 g/cm2 with a T-score of -2.1. This patient is considered osteopenic by World Healh Organization (WHO) Criteria.    12/16/2015 Imaging    CT abd/pelvis-  Progression of retroperitoneal and portacaval adenopathy compared to the prior study. Likely related to CLL.  Small hiatal hernia  Mild compression fractures T11 and T12, probably old. Correlate with any pain in this area.    05/22/2016 - 05/28/2016 Chemotherapy    BR day 1 and 2 x 1 cycle.  Bendamustine dose reduced by  50%.    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    01/28/2017 -  Chemotherapy    Cycle 1 day 1 of Gayzva and Chlorambucil. Rasburicase 3 mg given also.     01/28/2017 - 02/08/2017 Hospital Admission    Admitted for sycopal episode due to complete heart block. Patient went into PEA arrest requiring CPR and intubation. 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 Pacemaker placed  while inpatient. Started on eliquis.      INTERVAL HISTORY:  Mr. Meddaugh 82 y.o. male returns for follow-up of his CLL.  He underwent surgery for his squamous cell carcinoma on 03/10/2018.  He is recovering well from surgery.  He denies any fevers or night sweats.  He started back on ibrutinib on 03/12/2018.  He is tolerating it very well.  Denies any nausea vomiting or diarrhea.  He is taking Keflex since the surgery.    REVIEW OF SYSTEMS:  Review of Systems  Hematological: Bruises/bleeds easily.  All other systems reviewed and are negative.    PAST MEDICAL/SURGICAL HISTORY:  Past Medical History:  Diagnosis Date  . Anemia   . B12 deficiency 12/07/2014  . Chronic kidney disease    CKD III  . CLL (chronic lymphocytic leukemia) ([facility]) 01/24/2011  . Diabetes mellitus   . DM (diabetes mellitus) ([facility]) 01/24/2011  . Eczema 01/24/2011  . History of pneumonia 04/2014  . Hypertension   . Leukemia ([facility]) 6.22.2012   PER PATIENT  . Melanoma in situ ([facility]) 01/24/2011  . Mild obesity 01/24/2011  . Pneumonia    2018  . Port catheter in place 05/02/2012  . Presence of permanent cardiac pacemaker    [employer]  . Shingles   . Ulcer    Past Surgical History:  Procedure Laterality Date  . bleeding ulcer    . CATARACT EXTRACTION W/PHACO  05/05/2012   Procedure: CATARACT EXTRACTION PHACO AND INTRAOCULAR LENS PLACEMENT (IOC);  Surgeon: Tonny Branch, MD;  Location: [facility];  Service: Ophthalmology;  Laterality: Right;  CDE:13.25  . CATARACT EXTRACTION W/PHACO  05/15/2012   Procedure: CATARACT EXTRACTION PHACO AND INTRAOCULAR LENS PLACEMENT (IOC);  Surgeon: Tonny Branch, MD;  Location: [facility];  Service: Ophthalmology;  Laterality: Left;  CDE:  12.32  . COLONOSCOPY N/A 01/30/2016   Procedure: COLONOSCOPY;  Surgeon: Danie Binder, MD;  Location: [facility];  Service: Endoscopy;  Laterality: N/A;  2:00 PM - moved to 1:00 - office notified pt  . ESOPHAGOGASTRODUODENOSCOPY  2008   Dr. Oneida Alar: normal  esophagus, antral erythema, 1 cm clean based duodenal ulcera, negative H. plyori  . ESOPHAGOGASTRODUODENOSCOPY N/A 01/30/2016   Procedure: ESOPHAGOGASTRODUODENOSCOPY (EGD);  Surgeon: Danie Binder, MD;  Location: [facility];  Service: Endoscopy;  Laterality: N/A;  . EXCISION MASS HEAD Left 03/10/2018   Procedure: EXCISION MASS HEAD;  Surgeon: Melissa Montane, MD;  Location: [facility];  Service: ENT;  Laterality: Left;  . EYE SURGERY    . HERNIA REPAIR  2001  . PACEMAKER IMPLANT N/A 02/07/2017   Procedure: PACEMAKER IMPLANT;  Surgeon: Constance Haw, MD;  Location: [facility];  Service: Cardiovascular;  Laterality: N/A;  . PAROTIDECTOMY Left 03/10/2018   LEFT SIDE PAROTIDECTOMTY AND NECK DISECTION WITH RESECTION OF A FOREHEAD LESION  . PAROTIDECTOMY Left 03/10/2018   Procedure: LEFT SIDE PAROTIDECTOMTY  AND NECK DISECTION WITH RESECTION OF A FOREHEAD LESION;  Surgeon: Melissa Montane, MD;  Location: [facility];  Service: ENT;  Laterality: Left;  . PORTACATH PLACEMENT  2008  . SKIN FULL THICKNESS GRAFT Left 03/10/2018   Procedure: SKIN GRAFT FULL THICKNESS;  Surgeon: Melissa Montane, MD;  Location: [facility];  Service: ENT;  Laterality: Left;  . TONSILLECTOMY     removed around 85-61 years of age     SOCIAL HISTORY:  Social History   Socioeconomic History  . Marital status: Married    Spouse name: Not on file  . Number of children: Not on file  . Years of education: Not on file  . Highest education level: Not on file  Occupational History  . Not on file  Social Needs  . Financial resource strain: Not on file  . Food insecurity:    Worry: Not on file    Inability: Not on file  . Transportation needs:    Medical: Not on file    Non-medical: Not on file  Tobacco Use  . Smoking status: Former Smoker    Last attempt to quit: 09/11/1977    Years since quitting: 40.5  . Smokeless tobacco: Former Systems developer    Quit date: 04/25/1967  Substance and Sexual Activity  . Alcohol use: No  . Drug use: No    . Sexual activity: Not on file  Lifestyle  . Physical activity:    Days per week: Not on file    Minutes per session: Not on file  . Stress: Not on file  Relationships  . Social connections:    Talks on phone: Not on file    Gets together: Not on file    Attends religious service: Not on file    Active member of club or organization: Not on file    Attends meetings of clubs or organizations: Not on file    Relationship status: Not on file  . Intimate partner violence:    Fear of current or ex partner: Not on file    Emotionally abused: Not on file    Physically abused: Not on file    Forced sexual activity: Not on file  Other Topics Concern  . Not on file  Social History Narrative  . Not on file    FAMILY HISTORY:  Family History  Problem Relation Age of Onset  . Diabetes Father   . Colon cancer Neg Hx     CURRENT MEDICATIONS:  Outpatient Encounter Medications as of 03/26/2018  Medication Sig  . ACCU-CHEK AVIVA PLUS test strip   . acetaminophen (TYLENOL) 500 MG tablet Take 500 mg by mouth every 6 (six) hours as needed for moderate pain or headache.  . allopurinol (ZYLOPRIM) 100 MG tablet Take 1 tablet (100 mg total) by mouth daily.  . cephALEXin (KEFLEX) 500 MG capsule Take 1 capsule (500 mg total) by mouth 3 (three) times daily.  . cephALEXin (KEFLEX) 500 MG capsule Take by mouth.  . cholecalciferol (VITAMIN D) 400 units TABS tablet Take 400 Units by mouth daily.  [name] docusate sodium (COLACE) 100 MG capsule Take 100 mg by mouth daily as needed for mild constipation.   . feeding supplement, ENSURE ENLIVE, (ENSURE ENLIVE) LIQD Take 237 mLs by mouth 2 (two) times daily between meals. (Patient taking differently: Take 237 mLs by mouth daily. )  . ibrutinib (IMBRUVICA) 140 MG capsul Take 2 capsules (280 mg total) by mouth daily.  [name] JANUVIA 25 MG tablet Take 25 mg by  mouth daily.  [name] levETIRAcetam (KEPPRA) 100 MG/ML solution Take 7.5 mLs (750 mg total) by mouth 2 (two) times  daily.  . rosuvastatin (CRESTOR) 10 MG tablet Take 10 mg by mouth at bedtime.  . rosuvastatin (CRESTOR) 40 MG tablet Take 40 mg by mouth at bedtime.   . sevelamer carbonate (RENVELA) 800 MG tablet TAKE 1 TABLET BY MOUTH THREE TIMES A DAY WITH MEALS  . sitaGLIPtin (JANUVIA) 100 MG tablet Take 100 mg by mouth daily.  . sodium bicarbonate 650 MG tablet Take 650 mg by mouth 2 (two) times daily.   . vitamin C (ASCORBIC ACID) 500 MG tablet Take 500 mg by mouth daily.   Facility-Administered Encounter Medications as of 03/26/2018  Medication  . heparin lock flush 100 unit/mL  . sodium chloride flush (NS) 0.9 % injection 10 mL  . sodium polystyrene (KAYEXALATE) 15 GM/60ML suspension 30 g    ALLERGIES:  Allergies  Allergen Reactions  . Aspirin Other (See Comments)    Bleeding ulcers.   . Ibuprofen Other (See Comments)    Bleeding ulcers.     PHYSICAL EXAM:  ECOG Performance status: 1  I have reviewed his vitals.  Blood pressure 90/57 pulse is 86 respiratory rate 16 temperature 98. Physical Exam  Constitutional: He is oriented to person, place, and time. He appears well-developed and well-nourished.  Musculoskeletal: Normal range of motion.  Neurological: He is alert and oriented to person, place, and time.  Skin: Skin is warm and dry.  Psychiatric: He has a normal mood and affect. His behavior is normal. Judgment and thought content normal.  Surgical wound site is healing well with pink granulation tissue.   LABORATORY DATA:  I have reviewed the labs as listed.  CBC    Component Value Date/Time   WBC 27.4 (H) 03/25/2018 1106   RBC 2.50 (L) 03/25/2018 1106   HGB 8.6 (L) 03/25/2018 1106   HCT 28.7 (L) 03/25/2018 1106   PLT 226 03/25/2018 1106   MCV 114.8 (H) 03/25/2018 1106   MCH 34.4 (H) 03/25/2018 1106   MCHC 30.0 03/25/2018 1106   RDW 14.6 03/25/2018 1106   LYMPHSABS 24.3 (H) 03/25/2018 1106   MONOABS 0.2 03/25/2018 1106   EOSABS 0.0 03/25/2018 1106   BASOSABS 0.1  03/25/2018 1106   CMP Latest Ref Rng & Units 03/25/2018 03/04/2018 02/25/2018  Glucose 70 - 99 mg/dL 179(H) 93 157(H)  BUN 8 - 23 mg/dL 29(H) 45(H) 31(H)  Creatinine 0.61 - 1.24 mg/dL 2.28(H) 2.27(H) 1.75(H)  Sodium 135 - 145 mmol/L 136 136 138  Potassium 3.5 - 5.1 mmol/L 3.6 4.8 4.3  Chloride 98 - 111 mmol/L 99 104 104  CO2 22 - 32 mmol/L 30 23 28   Calcium 8.9 - 10.3 mg/dL 8.4(L) 9.3 8.7(L)  Total Protein 6.5 - 8.1 g/dL 6.7 - 7.6  Total Bilirubin 0.3 - 1.2 mg/dL 0.6 - 0.7  Alkaline Phos 38 - 126 U/L 170(H) - 60  AST 15 - 41 U/L 176(H) - 29  ALT 0 - 44 U/L 134(H) - 27        ASSESSMENT & PLAN:   CLL (chronic lymphocytic leukemia) ([facility]) 1. Stage IV CLL with history of poor tolerance to past chemotherapies: -Could not tolerate past treatments including FCR in 2010, Imbruvica 2015 (located by large leg rash), Zydelig 2831 (complicated by transaminitis despite dose reduction), Imbruvica rechallenge in 5176 (complicated by infection and progressive fatigue), bendamustine/Rituxan 1 cycle in 1607 (complicated by prolonged and significant cytopenia in addition to tumor  lysis syndrome and renal failure).  He could not even tolerate last treatment with obinutuzumab and chlorambucil. -He reports recent onset night sweats, tiredness on exertion and 15 pound weight loss in the last 1 month. -Ibrutinib 140 mg daily started on 11/22/2017 after TLS prophylaxis with rasburicase.  He was hospitalized for the next 2 days for observation with continuous IV fluids and lab monitoring.  He did not develop any tumor lysis syndrome.  He received 1 unit of blood transfusion yesterday prior to discharge. - Ibrutinib was increased to 2 tablets daily on 11/29/2017.  He has noticed easy bruising.  His tumor lysis syndrome labs were within normal limits. - He is continuing to get a good response with downtrending white cell count.  He received 1 unit of blood transfusion on 12/13/2017. - He is continuing ibrutinib at  this time. - Needle biopsy by Dr. Janace Hoard of the left parotid mass shows squamous cell carcinoma on 01/09/2018 - PET/CT scan on 01/10/2018 showed 3 hypermetabolic lesions associated with left parotid gland, largest bulges the skin surface.  Additional necrotic level 2 lymph node.  -The PET scan also showed mildly enlarged axillary, mediastinal, periaortic lymph nodes from his CLL.  There is also focal hypermetabolic activity along the gastric fundus and distal esophagus.  This is likely gastritis.  He had a history of peptic ulcer. - He had wide local excision done on 03/02/2018.  He started back on ibrutinib 2 days later.   -he is tolerating it very well.  I went over the lab results today.  His white count is 27.4 with hemoglobin of 8.6. - He had elevated LFTs with AST about 4 times upper limit of normal.  His only new medication was Keflex. -I will repeat his LFTs on Monday.  If they continue to go up, we will have to discontinue Keflex. - Otherwise I will see him back in 2 to 3 weeks for follow-up.  2.  Left temporal squamous cell carcinoma: -Biopsy of the left forehead on 03/21/2017 was consistent with moderately differentiated squamous cell carcinoma.  Fine-needle aspiration of the parotid gland on 01/09/2018 was consistent with squamous cell carcinoma.  He follows up with Dr. Janace Hoard for extensive resection. - He had wide local excision on 03/10/2018.  -I have reviewed the pathology report with him.  This showed invasive squamous cell carcinoma, multiple positive margins, 11/12 lymph nodes positive for squamous cell carcinoma, with multifocal extranodal extension, invasive carcinoma involving attached parotid gland.  His wound is healing as expected. -He was recommended to have radiation therapy for the positive margins and extracapsular extension. -I will make a referral to [facility].             Orders placed this encounter:  Orders Placed This Encounter  Procedures  . Hepatic  function panel  . Comprehensive metabolic panel  . CBC with Differential  . Lactate dehydrogenase      Derek Jack, MD [place] [phone]

## 2018-03-26 NOTE — Assessment & Plan Note (Signed)
1. Stage IV CLL with history of poor tolerance to past chemotherapies: -Could not tolerate past treatments including FCR in 2010, Imbruvica 2015 (located by large leg rash), Zydelig 8889 (complicated by transaminitis despite dose reduction), Imbruvica rechallenge in 1694 (complicated by infection and progressive fatigue), bendamustine/Rituxan 1 cycle in 5038 (complicated by prolonged and significant cytopenia in addition to tumor lysis syndrome and renal failure).  He could not even tolerate last treatment with obinutuzumab and chlorambucil. -He reports recent onset night sweats, tiredness on exertion and 15 pound weight loss in the last 1 month. -Ibrutinib 140 mg daily started on 11/22/2017 after TLS prophylaxis with rasburicase.  He was hospitalized for the next 2 days for observation with continuous IV fluids and lab monitoring.  He did not develop any tumor lysis syndrome.  He received 1 unit of blood transfusion yesterday prior to discharge. - Ibrutinib was increased to 2 tablets daily on 11/29/2017.  He has noticed easy bruising.  His tumor lysis syndrome labs were within normal limits. - He is continuing to get a good response with downtrending white cell count.  He received 1 unit of blood transfusion on 12/13/2017. - He is continuing ibrutinib at this time. - Needle biopsy by Dr. Janace Hoard of the left parotid mass shows squamous cell carcinoma on 01/09/2018 - PET/CT scan on 01/10/2018 showed 3 hypermetabolic lesions associated with left parotid gland, largest bulges the skin surface.  Additional necrotic level 2 lymph node.  -The PET scan also showed mildly enlarged axillary, mediastinal, periaortic lymph nodes from his CLL.  There is also focal hypermetabolic activity along the gastric fundus and distal esophagus.  This is likely gastritis.  He had a history of peptic ulcer. - He had wide local excision done on 03/02/2018.  He started back on ibrutinib 2 days later.   -he is tolerating it very well.  I  went over the lab results today.  His white count is 27.4 with hemoglobin of 8.6. - He had elevated LFTs with AST about 4 times upper limit of normal.  His only new medication was Keflex. -I will repeat his LFTs on Monday.  If they continue to go up, we will have to discontinue Keflex. - Otherwise I will see him back in 2 to 3 weeks for follow-up.  2.  Left temporal squamous cell carcinoma: -Biopsy of the left forehead on 03/21/2017 was consistent with moderately differentiated squamous cell carcinoma.  Fine-needle aspiration of the parotid gland on 01/09/2018 was consistent with squamous cell carcinoma.  He follows up with Dr. Janace Hoard for extensive resection. - He had wide local excision on 03/10/2018.  -I have reviewed the pathology report with him.  This showed invasive squamous cell carcinoma, multiple positive margins, 11/12 lymph nodes positive for squamous cell carcinoma, with multifocal extranodal extension, invasive carcinoma involving attached parotid gland.  His wound is healing as expected. -He was recommended to have radiation therapy for the positive margins and extracapsular extension. -I will make a referral to [facility].

## 2018-03-26 NOTE — Progress Notes (Unsigned)
Referral sent to [facility]. Records faxed on 11/13

## 2018-03-27 ENCOUNTER — Other Ambulatory Visit (HOSPITAL_COMMUNITY): Payer: Self-pay | Admitting: *Deleted

## 2018-03-31 ENCOUNTER — Inpatient Hospital Stay (HOSPITAL_COMMUNITY): Payer: Medicare Other

## 2018-03-31 DIAGNOSIS — Z87891 Personal history of nicotine dependence: Secondary | ICD-10-CM | POA: Diagnosis not present

## 2018-03-31 DIAGNOSIS — Z79899 Other long term (current) drug therapy: Secondary | ICD-10-CM | POA: Diagnosis not present

## 2018-03-31 DIAGNOSIS — Z992 Dependence on renal dialysis: Secondary | ICD-10-CM | POA: Diagnosis not present

## 2018-03-31 DIAGNOSIS — C9110 Chronic lymphocytic leukemia of B-cell type not having achieved remission: Secondary | ICD-10-CM

## 2018-03-31 DIAGNOSIS — Z9221 Personal history of antineoplastic chemotherapy: Secondary | ICD-10-CM | POA: Diagnosis not present

## 2018-03-31 DIAGNOSIS — N179 Acute kidney failure, unspecified: Secondary | ICD-10-CM | POA: Diagnosis not present

## 2018-03-31 LAB — HEPATIC FUNCTION PANEL
ALBUMIN: 2.7 g/dL — AB (ref 3.5–5.0)
ALK PHOS: 171 U/L — AB (ref 38–126)
ALT: 139 U/L — AB (ref 0–44)
AST: 95 U/L — ABNORMAL HIGH (ref 15–41)
Bilirubin, Direct: 0.1 mg/dL (ref 0.0–0.2)
Indirect Bilirubin: 0.5 mg/dL (ref 0.3–0.9)
TOTAL PROTEIN: 7.2 g/dL (ref 6.5–8.1)
Total Bilirubin: 0.6 mg/dL (ref 0.3–1.2)

## 2018-04-01 DIAGNOSIS — C44320 Squamous cell carcinoma of skin of unspecified parts of face: Secondary | ICD-10-CM | POA: Diagnosis not present

## 2018-04-15 ENCOUNTER — Inpatient Hospital Stay (HOSPITAL_COMMUNITY): Payer: Medicare Other

## 2018-04-15 ENCOUNTER — Other Ambulatory Visit (HOSPITAL_COMMUNITY): Payer: Self-pay | Admitting: Hematology

## 2018-04-15 ENCOUNTER — Encounter (HOSPITAL_COMMUNITY): Payer: Self-pay | Admitting: Hematology

## 2018-04-15 ENCOUNTER — Other Ambulatory Visit: Payer: Self-pay

## 2018-04-15 ENCOUNTER — Inpatient Hospital Stay (HOSPITAL_COMMUNITY): Payer: Medicare Other | Attending: Hematology | Admitting: Hematology

## 2018-04-15 VITALS — BP 116/66 | HR 68 | Resp 16 | Wt 114.5 lb

## 2018-04-15 DIAGNOSIS — R220 Localized swelling, mass and lump, head: Secondary | ICD-10-CM | POA: Insufficient documentation

## 2018-04-15 DIAGNOSIS — E883 Tumor lysis syndrome: Secondary | ICD-10-CM

## 2018-04-15 DIAGNOSIS — Z79899 Other long term (current) drug therapy: Secondary | ICD-10-CM | POA: Insufficient documentation

## 2018-04-15 DIAGNOSIS — J40 Bronchitis, not specified as acute or chronic: Secondary | ICD-10-CM | POA: Insufficient documentation

## 2018-04-15 DIAGNOSIS — Z87891 Personal history of nicotine dependence: Secondary | ICD-10-CM | POA: Diagnosis not present

## 2018-04-15 DIAGNOSIS — Z9221 Personal history of antineoplastic chemotherapy: Secondary | ICD-10-CM | POA: Insufficient documentation

## 2018-04-15 DIAGNOSIS — C44329 Squamous cell carcinoma of skin of other parts of face: Secondary | ICD-10-CM | POA: Insufficient documentation

## 2018-04-15 DIAGNOSIS — C07 Malignant neoplasm of parotid gland: Secondary | ICD-10-CM | POA: Diagnosis not present

## 2018-04-15 DIAGNOSIS — C9110 Chronic lymphocytic leukemia of B-cell type not having achieved remission: Secondary | ICD-10-CM

## 2018-04-15 LAB — CBC WITH DIFFERENTIAL/PLATELET
Abs Immature Granulocytes: 0.14 10*3/uL — ABNORMAL HIGH (ref 0.00–0.07)
BASOS PCT: 0 %
Basophils Absolute: 0.1 10*3/uL (ref 0.0–0.1)
EOS ABS: 0.1 10*3/uL (ref 0.0–0.5)
Eosinophils Relative: 0 %
HCT: 33.1 % — ABNORMAL LOW (ref 39.0–52.0)
HEMOGLOBIN: 9.7 g/dL — AB (ref 13.0–17.0)
IMMATURE GRANULOCYTES: 0 %
Lymphocytes Relative: 90 %
Lymphs Abs: 37.8 10*3/uL — ABNORMAL HIGH (ref 0.7–4.0)
MCH: 32.4 pg (ref 26.0–34.0)
MCHC: 29.3 g/dL — AB (ref 30.0–36.0)
MCV: 110.7 fL — ABNORMAL HIGH (ref 80.0–100.0)
Monocytes Absolute: 0.6 10*3/uL (ref 0.1–1.0)
Monocytes Relative: 1 %
NEUTROS ABS: 3.6 10*3/uL (ref 1.7–7.7)
NEUTROS PCT: 9 %
Platelets: 232 10*3/uL (ref 150–400)
RBC: 2.99 MIL/uL — AB (ref 4.22–5.81)
RDW: 16.4 % — AB (ref 11.5–15.5)
WBC: 42.2 10*3/uL — ABNORMAL HIGH (ref 4.0–10.5)
nRBC: 0 % (ref 0.0–0.2)

## 2018-04-15 LAB — COMPREHENSIVE METABOLIC PANEL
ALBUMIN: 2.8 g/dL — AB (ref 3.5–5.0)
ALK PHOS: 107 U/L (ref 38–126)
ALT: 48 U/L — ABNORMAL HIGH (ref 0–44)
ANION GAP: 7 (ref 5–15)
AST: 48 U/L — ABNORMAL HIGH (ref 15–41)
BUN: 27 mg/dL — ABNORMAL HIGH (ref 8–23)
CALCIUM: 9.4 mg/dL (ref 8.9–10.3)
CO2: 27 mmol/L (ref 22–32)
Chloride: 102 mmol/L (ref 98–111)
Creatinine, Ser: 2.05 mg/dL — ABNORMAL HIGH (ref 0.61–1.24)
GFR calc Af Amer: 34 mL/min — ABNORMAL LOW (ref >60)
GFR calc non Af Amer: 30 mL/min — ABNORMAL LOW (ref >60)
GLUCOSE: 93 mg/dL (ref 70–99)
POTASSIUM: 4.3 mmol/L (ref 3.5–5.1)
SODIUM: 136 mmol/L (ref 135–145)
TOTAL PROTEIN: 7.7 g/dL (ref 6.5–8.1)
Total Bilirubin: 0.4 mg/dL (ref 0.3–1.2)

## 2018-04-15 LAB — URIC ACID: URIC ACID, SERUM: 3.3 mg/dL — AB (ref 3.7–8.6)

## 2018-04-15 LAB — PHOSPHORUS: Phosphorus: 4.2 mg/dL (ref 2.5–4.6)

## 2018-04-15 LAB — LACTATE DEHYDROGENASE: LDH: 183 U/L (ref 98–192)

## 2018-04-15 NOTE — Progress Notes (Signed)
[place], [place] [address]   CLINIC:  Medical Oncology/Hematology  PCP:  [name], MD [place] [address] [phone]   REASON FOR VISIT: Follow-up for CLL  CURRENT THERAPY: Ibrutinib 280 mg daily  BRIEF ONCOLOGIC HISTORY:    CLL (chronic lymphocytic leukemia) ([place])   02/14/2005 Procedure    Peripheral flow cytometry- CLL (without 11q or 17 p deletion) presenting with stage 0 disease    10/25/2008 Progression    Increasing WBC and symptoms suggestive of progressive disease    11/09/2008 - 01/07/2009 Chemotherapy    Rituxan, fludarabine, cytoxan x 3 cycles with the discontinuation of fludarabine and cytoxan due to severe pancytopenia requiring hospitalization.    01/10/2009 Adverse Reaction    Severe pancytopenia and adverse side effects    02/01/2009 - 04/01/2009 Chemotherapy    Rituxan every 21 days x 3 cycles    04/02/2009 Remission    Stable disease.  Chemotherapy holiday started    06/22/2013 - 06/29/2013 Chemotherapy    Imbruvica daily    06/29/2013 Adverse Reaction    Large right posterior leg (popliteal) ecchymosis/violaceous rash.  Likely Imbruvica-induced    07/31/2013 - 10/12/2013 Chemotherapy    Zydelig (idelalisib) 150 mg BID monotherapy    10/12/2013 Adverse Reaction    Transaminitis, suspected to be secondary to [place].  Dose decreased.    10/12/2013 - 11/09/2013 Chemotherapy    Zydelig (idelalisib) 150 mg daily.    11/09/2013 Adverse Reaction    Worsening transaminitis.  Zydelig on HOLD    11/09/2013 Treatment Plan Change    Hold Zydelig    12/28/2013 Adverse Reaction    Herpes Zoster outbreak.  Partial response with Famvir x 1 week, switched to acyclovir.  Progression over 1-2 weeks with development of keratoconjunctivitis bilaterally.  VZ Immune Globulin 625 mg IM given on 01/13/14.    02/08/2014 Treatment Plan Change    Ibrutinib restarted 140 mg daily for one week followed by 280 mg daily  followed by 420 mg daily with prophylactic acyclovir.    02/08/2014 - 07/05/2014 Chemotherapy    Ibrutinib 420 mg daily    1May 16, 202015 - 04/17/2014 Hospital Admission    Right lower lobe pneumonia    07/05/2014 Adverse Reaction    Fissuring of fingertips and sore, red feet with transaminitis    07/05/2014 Treatment Plan Change    Hold Ibrutinib    07/22/2014 - 08/19/2014 Chemotherapy    Ibrutinib restarted at 280 mg daily.    08/09/2014 - 08/12/2014 Hospital Admission    Bilateral pneumonia    08/19/2014 Adverse Reaction    Nausea with vomiting.  Escorted to ED.    08/19/2014 Treatment Plan Change    Ibrutinib on hold- ED escort from clinic     10/18/2014 Miscellaneous    B12 weekly x 4 then monthly.    12/15/2015 Imaging    Bone density- BMD as determined from Femur Neck Left is 0.803 g/cm2 with a T-score of -2.1. This patient is considered osteopenic by World Healh Organization (WHO) Criteria.    12/16/2015 Imaging    CT abd/pelvis-  Progression of retroperitoneal and portacaval adenopathy compared to the prior study. Likely related to CLL.  Small hiatal hernia  Mild compression fractures T11 and T12, probably old. Correlate with any pain in this area.    05/22/2016 - 05/28/2016 Chemotherapy    BR day 1 and 2 x 1 cycle.  Bendamustine dose reduced by 50%.  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    01/28/2017 -  Chemotherapy    Cycle 1 day 1 of Gayzva and Chlorambucil. Rasburicase 3 mg given also.     01/28/2017 - 02/08/2017 Hospital Admission    Admitted for sycopal episode due to complete heart block. Patient went into PEA arrest requiring CPR and intubation. 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 Pacemaker placed while inpatient. Started on  eliquis.     INTERVAL HISTORY:  Randall Reyes 82 y.o. male returns for routine follow-up for CLL. He is here today with his wife. He recently has surgery and he is doing well. He has a follow up appointment next week to check his surgical site. He has a slight non-productive cough that he has had continuously for weeks now. He denies any bleeding. Denies any new pains. Denies any headaches. Denies any fevers or recent infections. He reports his appetite and energy level at 50%.     REVIEW OF SYSTEMS:  Review of Systems  Respiratory: Positive for cough.   All other systems reviewed and are negative.    PAST MEDICAL/SURGICAL HISTORY:  Past Medical History:  Diagnosis Date  . Anemia   . B12 deficiency 12/07/2014  . Chronic kidney disease    CKD III  . CLL (chronic lymphocytic leukemia) ([place]) 01/24/2011  . Diabetes mellitus   . DM (diabetes mellitus) ([place]) 01/24/2011  . Eczema 01/24/2011  . History of pneumonia 04/2014  . Hypertension   . Leukemia ([place]) 6.22.2012   PER PATIENT  . Melanoma in situ ([place]) 01/24/2011  . Mild obesity 01/24/2011  . Pneumonia    2018  . Port catheter in place 05/02/2012  . Presence of permanent cardiac pacemaker    [employer]  . Shingles   . Ulcer    Past Surgical History:  Procedure Laterality Date  . bleeding ulcer    . CATARACT EXTRACTION W/PHACO  05/05/2012   Procedure: CATARACT EXTRACTION PHACO AND INTRAOCULAR LENS PLACEMENT (IOC);  Surgeon: Tonny Branch, MD;  Location: [facility];  Service: Ophthalmology;  Laterality: Right;  CDE:13.25  . CATARACT EXTRACTION W/PHACO  05/15/2012   Procedure: CATARACT EXTRACTION PHACO AND INTRAOCULAR LENS PLACEMENT (IOC);  Surgeon: Tonny Branch, MD;  Location: [facility];  Service: Ophthalmology;  Laterality: Left;  CDE:  12.32  . COLONOSCOPY N/A 01/30/2016   Procedure: COLONOSCOPY;  Surgeon: Danie Binder, MD;  Location: [facility];  Service: Endoscopy;  Laterality: N/A;  2:00 PM - moved to 1:00 - office notified pt  .  ESOPHAGOGASTRODUODENOSCOPY  2008   Dr. Oneida Alar: normal esophagus, antral erythema, 1 cm clean based duodenal ulcera, negative H. plyori  . ESOPHAGOGASTRODUODENOSCOPY N/A 01/30/2016   Procedure: ESOPHAGOGASTRODUODENOSCOPY (EGD);  Surgeon: Danie Binder, MD;  Location: [facility];  Service: Endoscopy;  Laterality: N/A;  . EXCISION MASS HEAD Left 03/10/2018   Procedure: EXCISION MASS HEAD;  Surgeon: Melissa Montane, MD;  Location: [facility];  Service: ENT;  Laterality: Left;  . EYE SURGERY    . HERNIA REPAIR  2001  . PACEMAKER IMPLANT N/A 02/07/2017   Procedure: PACEMAKER IMPLANT;  Surgeon: Constance Haw, MD;  Location: [facility];  Service: Cardiovascular;  Laterality: N/A;  . PAROTIDECTOMY Left 03/10/2018   LEFT SIDE PAROTIDECTOMTY AND NECK DISECTION WITH RESECTION OF A FOREHEAD LESION  .  PAROTIDECTOMY Left 03/10/2018   Procedure: LEFT SIDE PAROTIDECTOMTY AND NECK DISECTION WITH RESECTION OF A FOREHEAD LESION;  Surgeon: Melissa Montane, MD;  Location: [facility];  Service: ENT;  Laterality: Left;  . PORTACATH PLACEMENT  2008  . SKIN FULL THICKNESS GRAFT Left 03/10/2018   Procedure: SKIN GRAFT FULL THICKNESS;  Surgeon: Melissa Montane, MD;  Location: [facility];  Service: ENT;  Laterality: Left;  . TONSILLECTOMY     removed around 54-60 years of age     SOCIAL HISTORY:  Social History   Socioeconomic History  . Marital status: Married    Spouse name: Not on file  . Number of children: Not on file  . Years of education: Not on file  . Highest education level: Not on file  Occupational History  . Not on file  Social Needs  . Financial resource strain: Not on file  . Food insecurity:    Worry: Not on file    Inability: Not on file  . Transportation needs:    Medical: Not on file    Non-medical: Not on file  Tobacco Use  . Smoking status: Former Smoker    Last attempt to quit: 09/11/1977    Years since quitting: 40.6  . Smokeless tobacco: Former Systems developer    Quit date: 04/25/1967  Substance and  Sexual Activity  . Alcohol use: No  . Drug use: No  . Sexual activity: Not on file  Lifestyle  . Physical activity:    Days per week: Not on file    Minutes per session: Not on file  . Stress: Not on file  Relationships  . Social connections:    Talks on phone: Not on file    Gets together: Not on file    Attends religious service: Not on file    Active member of club or organization: Not on file    Attends meetings of clubs or organizations: Not on file    Relationship status: Not on file  . Intimate partner violence:    Fear of current or ex partner: Not on file    Emotionally abused: Not on file    Physically abused: Not on file    Forced sexual activity: Not on file  Other Topics Concern  . Not on file  Social History Narrative  . Not on file    FAMILY HISTORY:  Family History  Problem Relation Age of Onset  . Diabetes Father   . Colon cancer Neg Hx     CURRENT MEDICATIONS:  Outpatient Encounter Medications as of 04/15/2018  Medication Sig  . ACCU-CHEK AVIVA PLUS test strip   . acetaminophen (TYLENOL) 500 MG tablet Take 500 mg by mouth every 6 (six) hours as needed for moderate pain or headache.  . allopurinol (ZYLOPRIM) 100 MG tablet Take 1 tablet (100 mg total) by mouth daily.  . cholecalciferol (VITAMIN D) 400 units TABS tablet Take 400 Units by mouth daily.  [name] docusate sodium (COLACE) 100 MG capsule Take 100 mg by mouth daily as needed for mild constipation.   . feeding supplement, ENSURE ENLIVE, (ENSURE ENLIVE) LIQD Take 237 mLs by mouth 2 (two) times daily between meals. (Patient taking differently: Take 237 mLs by mouth daily. )  . ibrutinib (IMBRUVICA) 140 MG capsul Take 2 capsules (280 mg total) by mouth daily.  [name] JANUVIA 25 MG tablet Take 25 mg by mouth daily.  [name] levETIRAcetam (KEPPRA) 100 MG/ML solution Take 7.5 mLs (750 mg total) by mouth 2 (two) times daily.  [name]  rosuvastatin (CRESTOR) 10 MG tablet Take 10 mg by mouth at bedtime.  . sevelamer carbonate  (RENVELA) 800 MG tablet TAKE 1 TABLET BY MOUTH THREE TIMES A DAY WITH MEALS  . sodium bicarbonate 650 MG tablet Take 650 mg by mouth 2 (two) times daily.   . vitamin C (ASCORBIC ACID) 500 MG tablet Take 500 mg by mouth daily.  . [DISCONTINUED] cephALEXin (KEFLEX) 500 MG capsule Take 1 capsule (500 mg total) by mouth 3 (three) times daily.  . [DISCONTINUED] rosuvastatin (CRESTOR) 40 MG tablet Take 40 mg by mouth at bedtime.   . [DISCONTINUED] sitaGLIPtin (JANUVIA) 100 MG tablet Take 100 mg by mouth daily.   Facility-Administered Encounter Medications as of 04/15/2018  Medication  . heparin lock flush 100 unit/mL  . sodium chloride flush (NS) 0.9 % injection 10 mL  . sodium polystyrene (KAYEXALATE) 15 GM/60ML suspension 30 g    ALLERGIES:  Allergies  Allergen Reactions  . Aspirin Other (See Comments)    Bleeding ulcers.   . Ibuprofen Other (See Comments)    Bleeding ulcers.     PHYSICAL EXAM:  ECOG Performance status: 1  Vitals:   04/15/18 0952  BP: 116/66  Pulse: 68  Resp: 16  SpO2: 94%   Filed Weights   04/15/18 0952  Weight: 114 lb 8 oz (51.9 kg)    Physical Exam  Constitutional: He is oriented to person, place, and time. He appears well-developed and well-nourished.  Musculoskeletal: Normal range of motion.  Neurological: He is alert and oriented to person, place, and time.  Skin: Skin is warm and dry.  Psychiatric: He has a normal mood and affect. His behavior is normal. Judgment and thought content normal.     LABORATORY DATA:  I have reviewed the labs as listed.  CBC    Component Value Date/Time   WBC 42.2 (H) 04/15/2018 0815   RBC 2.99 (L) 04/15/2018 0815   HGB 9.7 (L) 04/15/2018 0815   HCT 33.1 (L) 04/15/2018 0815   PLT 232 04/15/2018 0815   MCV 110.7 (H) 04/15/2018 0815   MCH 32.4 04/15/2018 0815   MCHC 29.3 (L) 04/15/2018 0815   RDW 16.4 (H) 04/15/2018 0815   LYMPHSABS 37.8 (H) 04/15/2018 0815   MONOABS 0.6 04/15/2018 0815   EOSABS 0.1  04/15/2018 0815   BASOSABS 0.1 04/15/2018 0815   CMP Latest Ref Rng & Units 04/15/2018 03/31/2018 03/25/2018  Glucose 70 - 99 mg/dL 93 - 179(H)  BUN 8 - 23 mg/dL 27(H) - 29(H)  Creatinine 0.61 - 1.24 mg/dL 2.05(H) - 2.28(H)  Sodium 135 - 145 mmol/L 136 - 136  Potassium 3.5 - 5.1 mmol/L 4.3 - 3.6  Chloride 98 - 111 mmol/L 102 - 99  CO2 22 - 32 mmol/L 27 - 30  Calcium 8.9 - 10.3 mg/dL 9.4 - 8.4(L)  Total Protein 6.5 - 8.1 g/dL 7.7 7.2 6.7  Total Bilirubin 0.3 - 1.2 mg/dL 0.4 0.6 0.6  Alkaline Phos 38 - 126 U/L 107 171(H) 170(H)  AST 15 - 41 U/L 48(H) 95(H) 176(H)  ALT 0 - 44 U/L 48(H) 139(H) 134(H)       DIAGNOSTIC IMAGING:  I have independently reviewed the scans and discussed with the patient.   I have reviewed Randall Finders, NP's note and agree with the documentation.  I personally performed a face-to-face visit, made revisions and my assessment and plan is as follows.     ASSESSMENT & PLAN:   CLL (chronic lymphocytic leukemia) ([facility]) 1. Stage IV  CLL with history of poor tolerance to past chemotherapies: -Could not tolerate past treatments including FCR in 2010, Imbruvica 2015 (located by large leg rash), Zydelig 1610 (complicated by transaminitis despite dose reduction), Imbruvica rechallenge in 9604 (complicated by infection and progressive fatigue), bendamustine/Rituxan 1 cycle in 5409 (complicated by prolonged and significant cytopenia in addition to tumor lysis syndrome and renal failure).  He could not even tolerate last treatment with obinutuzumab and chlorambucil. -He reports recent onset night sweats, tiredness on exertion and 15 pound weight loss in the last 1 month. -Ibrutinib 140 mg daily started on 11/22/2017 after TLS prophylaxis with rasburicase.  He was hospitalized for the next 2 days for observation with continuous IV fluids and lab monitoring.  He did not develop any tumor lysis syndrome.  He received 1 unit of blood transfusion yesterday prior to discharge. -  Ibrutinib was increased to 2 tablets daily on 11/29/2017.  He has noticed easy bruising.  His tumor lysis syndrome labs were within normal limits. - He is continuing to get a good response with downtrending white cell count.  He received 1 unit of blood transfusion on 12/13/2017. - He is continuing ibrutinib at this time. - Needle biopsy by Dr. Janace Hoard of the left parotid mass shows squamous cell carcinoma on 01/09/2018 - PET/CT scan on 01/10/2018 showed 3 hypermetabolic lesions associated with left parotid gland, largest bulges the skin surface.  Additional necrotic level 2 lymph node.  -The PET scan also showed mildly enlarged axillary, mediastinal, periaortic lymph nodes from his CLL.  There is also focal hypermetabolic activity along the gastric fundus and distal esophagus.  This is likely gastritis.  He had a history of peptic ulcer. - He had wide local excision done on 03/02/2018.  He started back on ibrutinib 2 days later.   -He is continuing ibrutinib without any missed doses.  He denies any fevers, night sweats or weight loss. - We discussed the results of the blood work today.  His white count went up slightly to 42.  Hemoglobin and platelets are stable. - We will closely watch his white count.  I will see him back in 3 weeks for follow-up with repeat blood count and physical exam.  2.  Left temporal squamous cell carcinoma: -Biopsy of the left forehead on 03/21/2017 was consistent with moderately differentiated squamous cell carcinoma.  Fine-needle aspiration of the parotid gland on 01/09/2018 was consistent with squamous cell carcinoma.  He follows up with Dr. Janace Hoard for extensive resection. - He had wide local excision on 03/10/2018.  -I have reviewed the pathology report with him.  This showed invasive squamous cell carcinoma, multiple positive margins, 11/12 lymph nodes positive for squamous cell carcinoma, with multifocal extranodal extension, invasive carcinoma involving attached parotid gland.   His wound is healing as expected. -He was recommended to have radiation therapy for the positive margins and extracapsular extension. -he was evaluated by Dr.Yanagihara for radiation therapy after the wound heals. -He saw Dr. Janace Hoard yesterday.  He has another appointment on Monday.              Orders placed this encounter:  Orders Placed This Encounter  Procedures  . Phosphorus  . CBC with Differential/Platelet  . Comprehensive metabolic panel  . Lactate dehydrogenase  . Uric acid      Derek Jack, MD [place] [phone]

## 2018-04-15 NOTE — Patient Instructions (Signed)
[place] at [facility] Discharge Instructions  Follow up with [place] in 3 weeks with labs    Thank you for choosing [place] at [facility] to provide your oncology and hematology care.  To afford each patient quality time with our provider, please arrive at least 15 minutes before your scheduled appointment time.   If you have a lab appointment with the [facility] please come in thru the  Main Entrance and check in at the main information desk  You need to re-schedule your appointment should you arrive 10 or more minutes late.  We strive to give you quality time with our providers, and arriving late affects you and other patients whose appointments are after yours.  Also, if you no show three or more times for appointments you may be dismissed from the clinic at the providers discretion.     Again, thank you for choosing [facility].  Our hope is that these requests will decrease the amount of time that you wait before being seen by our physicians.       _____________________________________________________________  Should you have questions after your visit to [facility], please contact our office at [phone] between the hours of 8:00 a.m. and 4:30 p.m.  Voicemails left after 4:00 p.m. will not be returned until the following business day.  For prescription refill requests, have your pharmacy contact our office and allow 72 hours.    Cancer Center Support Programs:   > Cancer Support Group  2nd Tuesday of the month 1pm-2pm, Journey Room

## 2018-04-15 NOTE — Assessment & Plan Note (Signed)
1. Stage IV CLL with history of poor tolerance to past chemotherapies: -Could not tolerate past treatments including FCR in 2010, Imbruvica 2015 (located by large leg rash), Zydelig 5784 (complicated by transaminitis despite dose reduction), Imbruvica rechallenge in 6962 (complicated by infection and progressive fatigue), bendamustine/Rituxan 1 cycle in 9528 (complicated by prolonged and significant cytopenia in addition to tumor lysis syndrome and renal failure).  He could not even tolerate last treatment with obinutuzumab and chlorambucil. -He reports recent onset night sweats, tiredness on exertion and 15 pound weight loss in the last 1 month. -Ibrutinib 140 mg daily started on 11/22/2017 after TLS prophylaxis with rasburicase.  He was hospitalized for the next 2 days for observation with continuous IV fluids and lab monitoring.  He did not develop any tumor lysis syndrome.  He received 1 unit of blood transfusion yesterday prior to discharge. - Ibrutinib was increased to 2 tablets daily on 11/29/2017.  He has noticed easy bruising.  His tumor lysis syndrome labs were within normal limits. - He is continuing to get a good response with downtrending white cell count.  He received 1 unit of blood transfusion on 12/13/2017. - He is continuing ibrutinib at this time. - Needle biopsy by Dr. Janace Hoard of the left parotid mass shows squamous cell carcinoma on 01/09/2018 - PET/CT scan on 01/10/2018 showed 3 hypermetabolic lesions associated with left parotid gland, largest bulges the skin surface.  Additional necrotic level 2 lymph node.  -The PET scan also showed mildly enlarged axillary, mediastinal, periaortic lymph nodes from his CLL.  There is also focal hypermetabolic activity along the gastric fundus and distal esophagus.  This is likely gastritis.  He had a history of peptic ulcer. - He had wide local excision done on 03/02/2018.  He started back on ibrutinib 2 days later.   -He is continuing ibrutinib without  any missed doses.  He denies any fevers, night sweats or weight loss. - We discussed the results of the blood work today.  His white count went up slightly to 42.  Hemoglobin and platelets are stable. - We will closely watch his white count.  I will see him back in 3 weeks for follow-up with repeat blood count and physical exam.  2.  Left temporal squamous cell carcinoma: -Biopsy of the left forehead on 03/21/2017 was consistent with moderately differentiated squamous cell carcinoma.  Fine-needle aspiration of the parotid gland on 01/09/2018 was consistent with squamous cell carcinoma.  He follows up with Dr. Janace Hoard for extensive resection. - He had wide local excision on 03/10/2018.  -I have reviewed the pathology report with him.  This showed invasive squamous cell carcinoma, multiple positive margins, 11/12 lymph nodes positive for squamous cell carcinoma, with multifocal extranodal extension, invasive carcinoma involving attached parotid gland.  His wound is healing as expected. -He was recommended to have radiation therapy for the positive margins and extracapsular extension. -he was evaluated by Dr.Yanagihara for radiation therapy after the wound heals. -He saw Dr. Janace Hoard yesterday.  He has another appointment on Monday.

## 2018-04-17 DIAGNOSIS — Z79899 Other long term (current) drug therapy: Secondary | ICD-10-CM | POA: Diagnosis not present

## 2018-04-17 DIAGNOSIS — E559 Vitamin D deficiency, unspecified: Secondary | ICD-10-CM | POA: Diagnosis not present

## 2018-04-17 DIAGNOSIS — D509 Iron deficiency anemia, unspecified: Secondary | ICD-10-CM | POA: Diagnosis not present

## 2018-04-17 DIAGNOSIS — R809 Proteinuria, unspecified: Secondary | ICD-10-CM | POA: Diagnosis not present

## 2018-04-17 DIAGNOSIS — N183 Chronic kidney disease, stage 3 (moderate): Secondary | ICD-10-CM | POA: Diagnosis not present

## 2018-04-22 DIAGNOSIS — N183 Chronic kidney disease, stage 3 (moderate): Secondary | ICD-10-CM | POA: Diagnosis not present

## 2018-04-22 DIAGNOSIS — E875 Hyperkalemia: Secondary | ICD-10-CM | POA: Diagnosis not present

## 2018-04-22 DIAGNOSIS — E1129 Type 2 diabetes mellitus with other diabetic kidney complication: Secondary | ICD-10-CM | POA: Diagnosis not present

## 2018-04-22 DIAGNOSIS — R809 Proteinuria, unspecified: Secondary | ICD-10-CM | POA: Diagnosis not present

## 2018-04-23 ENCOUNTER — Other Ambulatory Visit (HOSPITAL_COMMUNITY): Payer: Self-pay | Admitting: Nurse Practitioner

## 2018-04-23 DIAGNOSIS — C44320 Squamous cell carcinoma of skin of unspecified parts of face: Secondary | ICD-10-CM | POA: Diagnosis not present

## 2018-04-23 DIAGNOSIS — C44229 Squamous cell carcinoma of skin of left ear and external auricular canal: Secondary | ICD-10-CM | POA: Diagnosis not present

## 2018-04-23 MED ORDER — IBRUTINIB 140 MG PO CAPS: 280.0000 mg | ORAL_CAPSULE | Freq: Every day | ORAL | 0 refills | Status: AC

## 2018-04-24 ENCOUNTER — Inpatient Hospital Stay (HOSPITAL_COMMUNITY): Payer: Medicare Other

## 2018-04-24 ENCOUNTER — Encounter (HOSPITAL_COMMUNITY): Payer: Self-pay

## 2018-04-24 VITALS — BP 113/70 | HR 88 | Temp 97.8°F | Resp 16

## 2018-04-24 DIAGNOSIS — C44329 Squamous cell carcinoma of skin of other parts of face: Secondary | ICD-10-CM | POA: Diagnosis not present

## 2018-04-24 DIAGNOSIS — Z79899 Other long term (current) drug therapy: Secondary | ICD-10-CM | POA: Diagnosis not present

## 2018-04-24 DIAGNOSIS — C9110 Chronic lymphocytic leukemia of B-cell type not having achieved remission: Secondary | ICD-10-CM | POA: Diagnosis not present

## 2018-04-24 DIAGNOSIS — R220 Localized swelling, mass and lump, head: Secondary | ICD-10-CM | POA: Diagnosis not present

## 2018-04-24 DIAGNOSIS — E538 Deficiency of other specified B group vitamins: Secondary | ICD-10-CM

## 2018-04-24 DIAGNOSIS — Z87891 Personal history of nicotine dependence: Secondary | ICD-10-CM | POA: Diagnosis not present

## 2018-04-24 DIAGNOSIS — Z9221 Personal history of antineoplastic chemotherapy: Secondary | ICD-10-CM | POA: Diagnosis not present

## 2018-04-24 DIAGNOSIS — C07 Malignant neoplasm of parotid gland: Secondary | ICD-10-CM | POA: Diagnosis not present

## 2018-04-24 DIAGNOSIS — J40 Bronchitis, not specified as acute or chronic: Secondary | ICD-10-CM | POA: Diagnosis not present

## 2018-04-24 MED ORDER — CYANOCOBALAMIN 1000 MCG/ML IJ SOLN
INTRAMUSCULAR | Status: AC
  Filled 2018-04-24: qty 1

## 2018-04-24 MED ORDER — CYANOCOBALAMIN 1000 MCG/ML IJ SOLN
1000.0000 ug | Freq: Once | INTRAMUSCULAR | Status: AC
  Administered 2018-04-24: 1000 ug via INTRAMUSCULAR

## 2018-04-24 NOTE — Progress Notes (Signed)
Patient tolerated injection with no complaints voiced.  Site clean and dry with no bruising or swelling noted at site.  Band aid applied.  VSs with discharge and left ambulatory with no s/s of distress noted.  

## 2018-04-24 NOTE — Patient Instructions (Signed)
[facility] at [facility]  Discharge Instructions:   _______________________________________________________________  Thank you for choosing [facility] at [facility] to provide your oncology and hematology care.  To afford each patient quality time with our providers, please arrive at least 15 minutes before your scheduled appointment.  You need to re-schedule your appointment if you arrive 10 or more minutes late.  We strive to give you quality time with our providers, and arriving late affects you and other patients whose appointments are after yours.  Also, if you no show three or more times for appointments you may be dismissed from the clinic.  Again, thank you for choosing [facility] at [facility]. Our hope is that these requests will allow you access to exceptional care and in a timely manner. _______________________________________________________________  If you have questions after your visit, please contact our office at [phone] between the hours of 8:30 a.m. and 5:00 p.m. Voicemails left after 4:30 p.m. will not be returned until the following business day. _______________________________________________________________  For prescription refill requests, have your pharmacy contact our office. _______________________________________________________________  Recommendations made by the consultant and any test results will be sent to your referring physician. _______________________________________________________________ 

## 2018-04-25 ENCOUNTER — Other Ambulatory Visit (HOSPITAL_COMMUNITY): Payer: Self-pay | Admitting: Radiation Oncology

## 2018-04-25 DIAGNOSIS — C44320 Squamous cell carcinoma of skin of unspecified parts of face: Secondary | ICD-10-CM

## 2018-04-25 DIAGNOSIS — C44229 Squamous cell carcinoma of skin of left ear and external auricular canal: Secondary | ICD-10-CM

## 2018-04-30 DIAGNOSIS — C4442 Squamous cell carcinoma of skin of scalp and neck: Secondary | ICD-10-CM | POA: Diagnosis not present

## 2018-04-30 DIAGNOSIS — C44320 Squamous cell carcinoma of skin of unspecified parts of face: Secondary | ICD-10-CM | POA: Diagnosis not present

## 2018-05-02 DIAGNOSIS — C4442 Squamous cell carcinoma of skin of scalp and neck: Secondary | ICD-10-CM | POA: Diagnosis not present

## 2018-05-02 DIAGNOSIS — C44320 Squamous cell carcinoma of skin of unspecified parts of face: Secondary | ICD-10-CM | POA: Diagnosis not present

## 2018-05-05 ENCOUNTER — Ambulatory Visit (HOSPITAL_COMMUNITY)
Admission: RE | Admit: 2018-05-05 | Discharge: 2018-05-05 | Disposition: A | Discharge status: Home / Self Care | Payer: Medicare Other | Source: Ambulatory Visit | Attending: Radiation Oncology | Admitting: Radiation Oncology

## 2018-05-05 DIAGNOSIS — C44329 Squamous cell carcinoma of skin of other parts of face: Secondary | ICD-10-CM | POA: Diagnosis not present

## 2018-05-05 DIAGNOSIS — C44320 Squamous cell carcinoma of skin of unspecified parts of face: Secondary | ICD-10-CM | POA: Diagnosis not present

## 2018-05-05 DIAGNOSIS — C44229 Squamous cell carcinoma of skin of left ear and external auricular canal: Secondary | ICD-10-CM | POA: Insufficient documentation

## 2018-05-05 LAB — GLUCOSE, CAPILLARY: Glucose-Capillary: 90 mg/dL (ref 70–99)

## 2018-05-05 MED ORDER — FLUDEOXYGLUCOSE F - 18 (FDG) INJECTION
5.9600 | Freq: Once | INTRAVENOUS | Status: AC
  Administered 2018-05-05: 5.9 via INTRAVENOUS

## 2018-05-06 ENCOUNTER — Encounter (HOSPITAL_COMMUNITY): Payer: Self-pay | Admitting: Hematology

## 2018-05-06 ENCOUNTER — Inpatient Hospital Stay (HOSPITAL_COMMUNITY): Payer: Medicare Other

## 2018-05-06 ENCOUNTER — Inpatient Hospital Stay (HOSPITAL_BASED_OUTPATIENT_CLINIC_OR_DEPARTMENT_OTHER): Payer: Medicare Other | Admitting: Hematology

## 2018-05-06 ENCOUNTER — Ambulatory Visit (HOSPITAL_COMMUNITY)
Admission: RE | Admit: 2018-05-06 | Discharge: 2018-05-06 | Disposition: A | Discharge status: Home / Self Care | Payer: Medicare Other | Source: Ambulatory Visit | Attending: Nurse Practitioner | Admitting: Nurse Practitioner

## 2018-05-06 VITALS — BP 116/72 | HR 88 | Temp 98.6°F | Resp 16 | Wt 114.0 lb

## 2018-05-06 DIAGNOSIS — R220 Localized swelling, mass and lump, head: Secondary | ICD-10-CM | POA: Diagnosis not present

## 2018-05-06 DIAGNOSIS — Z87891 Personal history of nicotine dependence: Secondary | ICD-10-CM

## 2018-05-06 DIAGNOSIS — C9110 Chronic lymphocytic leukemia of B-cell type not having achieved remission: Secondary | ICD-10-CM

## 2018-05-06 DIAGNOSIS — Z79899 Other long term (current) drug therapy: Secondary | ICD-10-CM | POA: Diagnosis not present

## 2018-05-06 DIAGNOSIS — C44329 Squamous cell carcinoma of skin of other parts of face: Secondary | ICD-10-CM

## 2018-05-06 DIAGNOSIS — Z9221 Personal history of antineoplastic chemotherapy: Secondary | ICD-10-CM | POA: Diagnosis not present

## 2018-05-06 DIAGNOSIS — R05 Cough: Secondary | ICD-10-CM

## 2018-05-06 DIAGNOSIS — C07 Malignant neoplasm of parotid gland: Secondary | ICD-10-CM

## 2018-05-06 DIAGNOSIS — J40 Bronchitis, not specified as acute or chronic: Secondary | ICD-10-CM | POA: Diagnosis not present

## 2018-05-06 DIAGNOSIS — R059 Cough, unspecified: Secondary | ICD-10-CM

## 2018-05-06 LAB — COMPREHENSIVE METABOLIC PANEL
ALT: 18 U/L (ref 0–44)
AST: 25 U/L (ref 15–41)
Albumin: 2.9 g/dL — ABNORMAL LOW (ref 3.5–5.0)
Alkaline Phosphatase: 76 U/L (ref 38–126)
Anion gap: 7 (ref 5–15)
BUN: 28 mg/dL — ABNORMAL HIGH (ref 8–23)
CO2: 27 mmol/L (ref 22–32)
CREATININE: 1.85 mg/dL — AB (ref 0.61–1.24)
Calcium: 8.7 mg/dL — ABNORMAL LOW (ref 8.9–10.3)
Chloride: 101 mmol/L (ref 98–111)
GFR calc Af Amer: 39 mL/min — ABNORMAL LOW (ref >60)
GFR calc non Af Amer: 34 mL/min — ABNORMAL LOW (ref >60)
Glucose, Bld: 133 mg/dL — ABNORMAL HIGH (ref 70–99)
Potassium: 4 mmol/L (ref 3.5–5.1)
Sodium: 135 mmol/L (ref 135–145)
Total Bilirubin: 0.5 mg/dL (ref 0.3–1.2)
Total Protein: 7.8 g/dL (ref 6.5–8.1)

## 2018-05-06 LAB — CBC WITH DIFFERENTIAL/PLATELET
Abs Immature Granulocytes: 0.14 10*3/uL — ABNORMAL HIGH (ref 0.00–0.07)
BASOS ABS: 0.1 10*3/uL (ref 0.0–0.1)
Basophils Relative: 0 %
EOS PCT: 0 %
Eosinophils Absolute: 0.1 10*3/uL (ref 0.0–0.5)
HCT: 34.4 % — ABNORMAL LOW (ref 39.0–52.0)
Hemoglobin: 10.3 g/dL — ABNORMAL LOW (ref 13.0–17.0)
Immature Granulocytes: 0 %
Lymphocytes Relative: 91 %
Lymphs Abs: 36.5 10*3/uL — ABNORMAL HIGH (ref 0.7–4.0)
MCH: 32.6 pg (ref 26.0–34.0)
MCHC: 29.9 g/dL — ABNORMAL LOW (ref 30.0–36.0)
MCV: 108.9 fL — ABNORMAL HIGH (ref 80.0–100.0)
Monocytes Absolute: 0.6 10*3/uL (ref 0.1–1.0)
Monocytes Relative: 1 %
Neutro Abs: 3 10*3/uL (ref 1.7–7.7)
Neutrophils Relative %: 8 %
Platelets: 176 10*3/uL (ref 150–400)
RBC: 3.16 MIL/uL — ABNORMAL LOW (ref 4.22–5.81)
RDW: 17.2 % — ABNORMAL HIGH (ref 11.5–15.5)
WBC: 40.5 10*3/uL — ABNORMAL HIGH (ref 4.0–10.5)
nRBC: 0 % (ref 0.0–0.2)

## 2018-05-06 LAB — URIC ACID: Uric Acid, Serum: 3.5 mg/dL — ABNORMAL LOW (ref 3.7–8.6)

## 2018-05-06 LAB — LACTATE DEHYDROGENASE: LDH: 212 U/L — ABNORMAL HIGH (ref 98–192)

## 2018-05-06 LAB — PHOSPHORUS: Phosphorus: 3.6 mg/dL (ref 2.5–4.6)

## 2018-05-06 MED ORDER — LEVOFLOXACIN 250 MG PO TABS: 250.0000 mg | ORAL_TABLET | Freq: Every day | ORAL | 0 refills | Status: DC

## 2018-05-06 NOTE — Patient Instructions (Signed)
[place] at [facility] Discharge Instructions  Follow up in 6 weeks with labs Please pick up for antibiotic at your pharmacy.    Thank you for choosing [place] at [facility] to provide your oncology and hematology care.  To afford each patient quality time with our provider, please arrive at least 15 minutes before your scheduled appointment time.   If you have a lab appointment with the [facility] please come in thru the  Main Entrance and check in at the main information desk  You need to re-schedule your appointment should you arrive 10 or more minutes late.  We strive to give you quality time with our providers, and arriving late affects you and other patients whose appointments are after yours.  Also, if you no show three or more times for appointments you may be dismissed from the clinic at the providers discretion.     Again, thank you for choosing [facility].  Our hope is that these requests will decrease the amount of time that you wait before being seen by our physicians.       _____________________________________________________________  Should you have questions after your visit to [facility], please contact our office at [phone] between the hours of 8:00 a.m. and 4:30 p.m.  Voicemails left after 4:00 p.m. will not be returned until the following business day.  For prescription refill requests, have your pharmacy contact our office and allow 72 hours.    Cancer Center Support Programs:   > Cancer Support Group  2nd Tuesday of the month 1pm-2pm, Journey Room

## 2018-05-06 NOTE — Progress Notes (Signed)
[place], [place] [address]   CLINIC:  Medical Oncology/Hematology  PCP:  Randall Squibb, MD [place] [address] [phone]   REASON FOR VISIT: Follow-up for CLL  CURRENT THERAPY: Ibrutinib 280 mg daily  BRIEF ONCOLOGIC HISTORY:    CLL (chronic lymphocytic leukemia) ([place])   02/14/2005 Procedure    Peripheral flow cytometry- CLL (without 11q or 17 p deletion) presenting with stage 0 disease    10/25/2008 Progression    Increasing WBC and symptoms suggestive of progressive disease    11/09/2008 - 01/07/2009 Chemotherapy    Rituxan, fludarabine, cytoxan x 3 cycles with the discontinuation of fludarabine and cytoxan due to severe pancytopenia requiring hospitalization.    01/10/2009 Adverse Reaction    Severe pancytopenia and adverse side effects    02/01/2009 - 04/01/2009 Chemotherapy    Rituxan every 21 days x 3 cycles    04/02/2009 Remission    Stable disease.  Chemotherapy holiday started    06/22/2013 - 06/29/2013 Chemotherapy    Imbruvica daily    06/29/2013 Adverse Reaction    Large right posterior leg (popliteal) ecchymosis/violaceous rash.  Likely Imbruvica-induced    07/31/2013 - 10/12/2013 Chemotherapy    Zydelig (idelalisib) 150 mg BID monotherapy    10/12/2013 Adverse Reaction    Transaminitis, suspected to be secondary to [place].  Dose decreased.    10/12/2013 - 11/09/2013 Chemotherapy    Zydelig (idelalisib) 150 mg daily.    11/09/2013 Adverse Reaction    Worsening transaminitis.  Zydelig on HOLD    11/09/2013 Treatment Plan Change    Hold Zydelig    12/28/2013 Adverse Reaction    Herpes Zoster outbreak.  Partial response with Famvir x 1 week, switched to acyclovir.  Progression over 1-2 weeks with development of keratoconjunctivitis bilaterally.  VZ Immune Globulin 625 mg IM given on 01/13/14.    02/08/2014 Treatment Plan Change    Ibrutinib restarted 140 mg daily for one week followed by 280 mg daily  followed by 420 mg daily with prophylactic acyclovir.    02/08/2014 - 07/05/2014 Chemotherapy    Ibrutinib 420 mg daily    106/08/202015 - 04/17/2014 Hospital Admission    Right lower lobe pneumonia    07/05/2014 Adverse Reaction    Fissuring of fingertips and sore, red feet with transaminitis    07/05/2014 Treatment Plan Change    Hold Ibrutinib    07/22/2014 - 08/19/2014 Chemotherapy    Ibrutinib restarted at 280 mg daily.    08/09/2014 - 08/12/2014 Hospital Admission    Bilateral pneumonia    08/19/2014 Adverse Reaction    Nausea with vomiting.  Escorted to ED.    08/19/2014 Treatment Plan Change    Ibrutinib on hold- ED escort from clinic     10/18/2014 Miscellaneous    B12 weekly x 4 then monthly.    12/15/2015 Imaging    Bone density- BMD as determined from Femur Neck Left is 0.803 g/cm2 with a T-score of -2.1. This patient is considered osteopenic by World Healh Organization (WHO) Criteria.    12/16/2015 Imaging    CT abd/pelvis-  Progression of retroperitoneal and portacaval adenopathy compared to the prior study. Likely related to CLL.  Small hiatal hernia  Mild compression fractures T11 and T12, probably old. Correlate with any pain in this area.    05/22/2016 - 05/28/2016 Chemotherapy    BR day 1 and 2 x 1 cycle.  Bendamustine dose reduced by 50%.  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    01/28/2017 -  Chemotherapy    Cycle 1 day 1 of Gayzva and Chlorambucil. Rasburicase 3 mg given also.     01/28/2017 - 02/08/2017 Hospital Admission    Admitted for sycopal episode due to complete heart block. Patient went into PEA arrest requiring CPR and intubation. 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 Pacemaker placed while inpatient. Started on  eliquis.      INTERVAL HISTORY:  [name] 82 y.o. male returns for routine follow-up for CLL. He is here today with his wife. His left eye and face is drooping more. His lips are swollen. They denies any new medications. His eye is burning from being dry. He is fatigued. He has developed a cough and productive yellow sputum for the past two weeks. Denies any nausea, vomiting, or diarrhea. Denies any new pains. Had not noticed any recent bleeding such as epistaxis, hematuria or hematochezia. Denies recent chest pain on exertion, shortness of breath on minimal exertion, pre-syncopal episodes, or palpitations. Denies any numbness or tingling in hands or feet. Denies any recent fevers, infections, or recent hospitalizations. He reports his appetite and his energy level at 25%. He is not very active at home his wife make him get up and move during the day.    REVIEW OF SYSTEMS:  Review of Systems  Eyes: Positive for eye problems (drooping left eye).  Respiratory: Positive for shortness of breath.   All other systems reviewed and are negative.    PAST MEDICAL/SURGICAL HISTORY:  Past Medical History:  Diagnosis Date  . Anemia   . B12 deficiency 12/07/2014  . Chronic kidney disease    CKD III  . CLL (chronic lymphocytic leukemia) ([place]) 01/24/2011  . Diabetes mellitus   . DM (diabetes mellitus) ([place]) 01/24/2011  . Eczema 01/24/2011  . History of pneumonia 04/2014  . Hypertension   . Leukemia ([place]) 6.22.2012   PER PATIENT  . Melanoma in situ ([place]) 01/24/2011  . Mild obesity 01/24/2011  . Pneumonia    2018  . Port catheter in place 05/02/2012  . Presence of permanent cardiac pacemaker    [employer]  . Shingles   . Ulcer    Past Surgical History:  Procedure Laterality Date  . bleeding ulcer    . CATARACT EXTRACTION W/PHACO  05/05/2012   Procedure: CATARACT EXTRACTION PHACO AND INTRAOCULAR LENS PLACEMENT (IOC);  Surgeon: Randall Branch, MD;  Location: [facility];  Service: Ophthalmology;   Laterality: Right;  CDE:13.25  . CATARACT EXTRACTION W/PHACO  05/15/2012   Procedure: CATARACT EXTRACTION PHACO AND INTRAOCULAR LENS PLACEMENT (IOC);  Surgeon: Randall Branch, MD;  Location: [facility];  Service: Ophthalmology;  Laterality: Left;  CDE:  12.32  . COLONOSCOPY N/A 01/30/2016   Procedure: COLONOSCOPY;  Surgeon: Danie Binder, MD;  Location: [facility];  Service: Endoscopy;  Laterality: N/A;  2:00 PM - moved to 1:00 - office notified pt  . ESOPHAGOGASTRODUODENOSCOPY  2008   Dr. Oneida Alar: normal esophagus, antral erythema, 1 cm clean based duodenal ulcera, negative H. plyori  . ESOPHAGOGASTRODUODENOSCOPY N/A 01/30/2016   Procedure: ESOPHAGOGASTRODUODENOSCOPY (EGD);  Surgeon: Danie Binder, MD;  Location: [facility];  Service: Endoscopy;  Laterality: N/A;  . EXCISION MASS HEAD Left 03/10/2018   Procedure: EXCISION MASS HEAD;  Surgeon:  Melissa Montane, MD;  Location: [facility];  Service: ENT;  Laterality: Left;  . EYE SURGERY    . HERNIA REPAIR  2001  . PACEMAKER IMPLANT N/A 02/07/2017   Procedure: PACEMAKER IMPLANT;  Surgeon: Constance Haw, MD;  Location: [facility];  Service: Cardiovascular;  Laterality: N/A;  . PAROTIDECTOMY Left 03/10/2018   LEFT SIDE PAROTIDECTOMTY AND NECK DISECTION WITH RESECTION OF A FOREHEAD LESION  . PAROTIDECTOMY Left 03/10/2018   Procedure: LEFT SIDE PAROTIDECTOMTY AND NECK DISECTION WITH RESECTION OF A FOREHEAD LESION;  Surgeon: Melissa Montane, MD;  Location: [facility];  Service: ENT;  Laterality: Left;  . PORTACATH PLACEMENT  2008  . SKIN FULL THICKNESS GRAFT Left 03/10/2018   Procedure: SKIN GRAFT FULL THICKNESS;  Surgeon: Melissa Montane, MD;  Location: [facility];  Service: ENT;  Laterality: Left;  . TONSILLECTOMY     removed around 66-43 years of age     SOCIAL HISTORY:  Social History   Socioeconomic History  . Marital status: Married    Spouse name: Not on file  . Number of children: Not on file  . Years of education: Not on file  . Highest education  level: Not on file  Occupational History  . Not on file  Social Needs  . Financial resource strain: Not on file  . Food insecurity:    Worry: Not on file    Inability: Not on file  . Transportation needs:    Medical: Not on file    Non-medical: Not on file  Tobacco Use  . Smoking status: Former Smoker    Last attempt to quit: 09/11/1977    Years since quitting: 40.6  . Smokeless tobacco: Former Systems developer    Quit date: 04/25/1967  Substance and Sexual Activity  . Alcohol use: No  . Drug use: No  . Sexual activity: Not on file  Lifestyle  . Physical activity:    Days per week: Not on file    Minutes per session: Not on file  . Stress: Not on file  Relationships  . Social connections:    Talks on phone: Not on file    Gets together: Not on file    Attends religious service: Not on file    Active member of club or organization: Not on file    Attends meetings of clubs or organizations: Not on file    Relationship status: Not on file  . Intimate partner violence:    Fear of current or ex partner: Not on file    Emotionally abused: Not on file    Physically abused: Not on file    Forced sexual activity: Not on file  Other Topics Concern  . Not on file  Social History Narrative  . Not on file    FAMILY HISTORY:  Family History  Problem Relation Age of Onset  . Diabetes Father   . Colon cancer Neg Hx     CURRENT MEDICATIONS:  Outpatient Encounter Medications as of 05/06/2018  Medication Sig  . ACCU-CHEK AVIVA PLUS test strip   . acetaminophen (TYLENOL) 500 MG tablet Take 500 mg by mouth every 6 (six) hours as needed for moderate pain or headache.  . allopurinol (ZYLOPRIM) 100 MG tablet Take 1 tablet (100 mg total) by mouth daily.  . cholecalciferol (VITAMIN D) 400 units TABS tablet Take 400 Units by mouth daily.  [name] docusate sodium (COLACE) 100 MG capsule Take 100 mg by mouth daily as needed for mild constipation.   [name]  feeding supplement, ENSURE ENLIVE, (ENSURE ENLIVE) LIQD  Take 237 mLs by mouth 2 (two) times daily between meals. (Patient taking differently: Take 237 mLs by mouth daily. )  . ibrutinib (IMBRUVICA) 140 MG capsul Take 2 capsules (280 mg total) by mouth daily.  [name] JANUVIA 25 MG tablet Take 25 mg by mouth daily.  [name] levETIRAcetam (KEPPRA) 100 MG/ML solution Take 7.5 mLs (750 mg total) by mouth 2 (two) times daily.  . rosuvastatin (CRESTOR) 10 MG tablet Take 10 mg by mouth at bedtime.  . sodium bicarbonate 650 MG tablet Take 650 mg by mouth 2 (two) times daily.   . vitamin C (ASCORBIC ACID) 500 MG tablet Take 500 mg by mouth daily.  [name] levofloxacin (LEVAQUIN) 250 MG tablet Take 1 tablet (250 mg total) by mouth daily. Take 2 tablets on the first day then take one tablet daily for the remaining 6 days.  . [DISCONTINUED] sevelamer carbonate (RENVELA) 800 MG tablet TAKE 1 TABLET BY MOUTH THREE TIMES A DAY WITH MEALS   Facility-Administered Encounter Medications as of 05/06/2018  Medication  . heparin lock flush 100 unit/mL  . sodium chloride flush (NS) 0.9 % injection 10 mL  . sodium polystyrene (KAYEXALATE) 15 GM/60ML suspension 30 g    ALLERGIES:  Allergies  Allergen Reactions  . Aspirin Other (See Comments)    Bleeding ulcers.   . Ibuprofen Other (See Comments)    Bleeding ulcers.     PHYSICAL EXAM:  ECOG Performance status: 1  Vitals:   05/06/18 1044  BP: 116/72  Pulse: 88  Resp: 16  Temp: 98.6 F (37 C)  SpO2: 91%   Filed Weights   05/06/18 1044  Weight: 114 lb (51.7 kg)    Physical Exam Constitutional:      Appearance: Normal appearance. He is normal weight.  Eyes:     General:        Left eye: Discharge present. Musculoskeletal: Normal range of motion.  Skin:    General: Skin is warm.     Comments: Drooping left side of face. Edema and red watery eye.  Neurological:     Mental Status: He is alert and oriented to person, place, and time. Mental status is at baseline.  Psychiatric:        Mood and Affect: Mood normal.         Behavior: Behavior normal.        Thought Content: Thought content normal.        Judgment: Judgment normal.      LABORATORY DATA:  I have reviewed the labs as listed.  CBC    Component Value Date/Time   WBC 40.5 (H) 05/06/2018 1012   RBC 3.16 (L) 05/06/2018 1012   HGB 10.3 (L) 05/06/2018 1012   HCT 34.4 (L) 05/06/2018 1012   PLT 176 05/06/2018 1012   MCV 108.9 (H) 05/06/2018 1012   MCH 32.6 05/06/2018 1012   MCHC 29.9 (L) 05/06/2018 1012   RDW 17.2 (H) 05/06/2018 1012   LYMPHSABS 36.5 (H) 05/06/2018 1012   MONOABS 0.6 05/06/2018 1012   EOSABS 0.1 05/06/2018 1012   BASOSABS 0.1 05/06/2018 1012   CMP Latest Ref Rng & Units 05/06/2018 04/15/2018 03/31/2018  Glucose 70 - 99 mg/dL 133(H) 93 -  BUN 8 - 23 mg/dL 28(H) 27(H) -  Creatinine 0.61 - 1.24 mg/dL 1.85(H) 2.05(H) -  Sodium 135 - 145 mmol/L 135 136 -  Potassium 3.5 - 5.1 mmol/L 4.0 4.3 -  Chloride 98 - 111 mmol/L  101 102 -  CO2 22 - 32 mmol/L 27 27 -  Calcium 8.9 - 10.3 mg/dL 8.7(L) 9.4 -  Total Protein 6.5 - 8.1 g/dL 7.8 7.7 7.2  Total Bilirubin 0.3 - 1.2 mg/dL 0.5 0.4 0.6  Alkaline Phos 38 - 126 U/L 76 107 171(H)  AST 15 - 41 U/L 25 48(H) 95(H)  ALT 0 - 44 U/L 18 48(H) 139(H)       DIAGNOSTIC IMAGING:  I have independently reviewed the scans and discussed with the patient.   I have reviewed Randall Finders, NP's note and agree with the documentation.  I personally performed a face-to-face visit, made revisions and my assessment and plan is as follows.    ASSESSMENT & PLAN:   CLL (chronic lymphocytic leukemia) ([place]) 1. Stage IV CLL with history of poor tolerance to past chemotherapies: -Could not tolerate past treatments including FCR in 2010, Imbruvica 2015 (located by large leg rash), Zydelig 5329 (complicated by transaminitis despite dose reduction), Imbruvica rechallenge in 9242 (complicated by infection and progressive fatigue), bendamustine/Rituxan 1 cycle in 6834 (complicated by prolonged and  significant cytopenia in addition to tumor lysis syndrome and renal failure).  He could not even tolerate last treatment with obinutuzumab and chlorambucil. -He reports recent onset night sweats, tiredness on exertion and 15 pound weight loss in the last 1 month. -Ibrutinib 140 mg daily started on 11/22/2017 after TLS prophylaxis with rasburicase.  He was hospitalized for the next 2 days for observation with continuous IV fluids and lab monitoring.  He did not develop any tumor lysis syndrome.  He received 1 unit of blood transfusion yesterday prior to discharge. - Ibrutinib was increased to 2 tablets daily on 11/29/2017.  He has noticed easy bruising.  His tumor lysis syndrome labs were within normal limits. - He is continuing to get a good response with downtrending white cell count.  He received 1 unit of blood transfusion on 12/13/2017. - He is continuing ibrutinib at this time. - Needle biopsy by Dr. Janace Hoard of the left parotid mass shows squamous cell carcinoma on 01/09/2018 - PET/CT scan on 01/10/2018 showed 3 hypermetabolic lesions associated with left parotid gland, largest bulges the skin surface.  Additional necrotic level 2 lymph node.  -The PET scan also showed mildly enlarged axillary, mediastinal, periaortic lymph nodes from his CLL.  There is also focal hypermetabolic activity along the gastric fundus and distal esophagus.  This is likely gastritis.  He had a history of peptic ulcer. - Ibrutinib was restarted back after wide local excision of the temporal lesion on 03/02/2018. -He is continuing ibrutinib without any problems.  No bleeding was reported. -He complained of cough with yellowish expectoration for the last 10 to 14 days.  I have done a chest x-ray in our office today.  It shows bronchitis with no evidence of pneumonia.  I have given a prescription for Levaquin 500 mg on day 1 followed by 250 mg for 6 days. - We reviewed his blood work today.  White count is 40,000. -He has developed  swelling of the left cheek and lips about 4 to 5 days ago.  On further questioning no new medications were started.  However patient's wife reports that a new bottle of allopurinol was started.  I have told her to hold allopurinol.  He does not have any breathing difficulty at this time.  Left lower eyelid is also drooping.  He was told to put artificial tears in it. -We will see him back in  3 weeks for follow-up.  2.  Left temporal squamous cell carcinoma: -Biopsy of the left forehead on 03/21/2017 was consistent with moderately differentiated squamous cell carcinoma.  Fine-needle aspiration of the parotid gland on 01/09/2018 was consistent with squamous cell carcinoma.  He follows up with Dr. Janace Hoard for extensive resection. - He had wide local excision on 03/10/2018.  -Pathology showed invasive squamous cell carcinoma, multiple positive margins, 11 out of 12 lymph nodes positive for squamous cell carcinoma, multifocal extranodal extension, invasive carcinoma involving attached parotid gland. -He was evaluated by Dr.Yanagihara.  A simulation scan was done. - Unfortunately skin graft on the scalp did not take.  He will need surgical revision.  Skin incision in the left preauricular region healed well. - Cemiplimab is FDA approved for locally advanced squamous cell carcinoma.  However we do not know if the combination of ibrutinib and Cemiplimab is safe. -I have instructed him to proceed with radiation therapy.  If there is any further recurrences, we will consider Cemiplimab.              Orders placed this encounter:  Orders Placed This Encounter  Procedures  . DG Chest 2 View  . Lactate dehydrogenase  . Magnesium  . CBC with Differential/Platelet  . Comprehensive metabolic panel      Randall Jack, MD [place] [phone]

## 2018-05-06 NOTE — Assessment & Plan Note (Signed)
1. Stage IV CLL with history of poor tolerance to past chemotherapies: -Could not tolerate past treatments including FCR in 2010, Imbruvica 2015 (located by large leg rash), Zydelig 4627 (complicated by transaminitis despite dose reduction), Imbruvica rechallenge in 0350 (complicated by infection and progressive fatigue), bendamustine/Rituxan 1 cycle in 0938 (complicated by prolonged and significant cytopenia in addition to tumor lysis syndrome and renal failure).  He could not even tolerate last treatment with obinutuzumab and chlorambucil. -He reports recent onset night sweats, tiredness on exertion and 15 pound weight loss in the last 1 month. -Ibrutinib 140 mg daily started on 11/22/2017 after TLS prophylaxis with rasburicase.  He was hospitalized for the next 2 days for observation with continuous IV fluids and lab monitoring.  He did not develop any tumor lysis syndrome.  He received 1 unit of blood transfusion yesterday prior to discharge. - Ibrutinib was increased to 2 tablets daily on 11/29/2017.  He has noticed easy bruising.  His tumor lysis syndrome labs were within normal limits. - He is continuing to get a good response with downtrending white cell count.  He received 1 unit of blood transfusion on 12/13/2017. - He is continuing ibrutinib at this time. - Needle biopsy by Dr. Janace Hoard of the left parotid mass shows squamous cell carcinoma on 01/09/2018 - PET/CT scan on 01/10/2018 showed 3 hypermetabolic lesions associated with left parotid gland, largest bulges the skin surface.  Additional necrotic level 2 lymph node.  -The PET scan also showed mildly enlarged axillary, mediastinal, periaortic lymph nodes from his CLL.  There is also focal hypermetabolic activity along the gastric fundus and distal esophagus.  This is likely gastritis.  He had a history of peptic ulcer. - Ibrutinib was restarted back after wide local excision of the temporal lesion on 03/02/2018. -He is continuing ibrutinib without  any problems.  No bleeding was reported. -He complained of cough with yellowish expectoration for the last 10 to 14 days.  I have done a chest x-ray in our office today.  It shows bronchitis with no evidence of pneumonia.  I have given a prescription for Levaquin 500 mg on day 1 followed by 250 mg for 6 days. - We reviewed his blood work today.  White count is 40,000. -He has developed swelling of the left cheek and lips about 4 to 5 days ago.  On further questioning no new medications were started.  However patient's wife reports that a new bottle of allopurinol was started.  I have told her to hold allopurinol.  He does not have any breathing difficulty at this time.  Left lower eyelid is also drooping.  He was told to put artificial tears in it. -We will see him back in 3 weeks for follow-up.  2.  Left temporal squamous cell carcinoma: -Biopsy of the left forehead on 03/21/2017 was consistent with moderately differentiated squamous cell carcinoma.  Fine-needle aspiration of the parotid gland on 01/09/2018 was consistent with squamous cell carcinoma.  He follows up with Dr. Janace Hoard for extensive resection. - He had wide local excision on 03/10/2018.  -Pathology showed invasive squamous cell carcinoma, multiple positive margins, 11 out of 12 lymph nodes positive for squamous cell carcinoma, multifocal extranodal extension, invasive carcinoma involving attached parotid gland. -He was evaluated by Dr.Yanagihara.  A simulation scan was done. - Unfortunately skin graft on the scalp did not take.  He will need surgical revision.  Skin incision in the left preauricular region healed well. - Cemiplimab is FDA approved for locally  advanced squamous cell carcinoma.  However we do not know if the combination of ibrutinib and Cemiplimab is safe. -I have instructed him to proceed with radiation therapy.  If there is any further recurrences, we will consider Cemiplimab.

## 2018-05-12 DIAGNOSIS — C44320 Squamous cell carcinoma of skin of unspecified parts of face: Secondary | ICD-10-CM | POA: Diagnosis not present

## 2018-05-12 DIAGNOSIS — C4442 Squamous cell carcinoma of skin of scalp and neck: Secondary | ICD-10-CM | POA: Diagnosis not present

## 2018-05-19 DIAGNOSIS — C44320 Squamous cell carcinoma of skin of unspecified parts of face: Secondary | ICD-10-CM | POA: Diagnosis not present

## 2018-05-19 DIAGNOSIS — C4442 Squamous cell carcinoma of skin of scalp and neck: Secondary | ICD-10-CM | POA: Diagnosis not present

## 2018-05-20 ENCOUNTER — Ambulatory Visit (INDEPENDENT_AMBULATORY_CARE_PROVIDER_SITE_OTHER): Payer: Medicare Other

## 2018-05-20 DIAGNOSIS — I495 Sick sinus syndrome: Secondary | ICD-10-CM

## 2018-05-20 DIAGNOSIS — C44320 Squamous cell carcinoma of skin of unspecified parts of face: Secondary | ICD-10-CM | POA: Diagnosis not present

## 2018-05-20 DIAGNOSIS — R001 Bradycardia, unspecified: Secondary | ICD-10-CM

## 2018-05-21 DIAGNOSIS — C44320 Squamous cell carcinoma of skin of unspecified parts of face: Secondary | ICD-10-CM | POA: Diagnosis not present

## 2018-05-21 LAB — CUP PACEART REMOTE DEVICE CHECK
Battery Voltage: 3.02 V
Brady Statistic AP VP Percent: 0 %
Brady Statistic AP VS Percent: 0 %
Brady Statistic AS VP Percent: 94.66 %
Brady Statistic AS VS Percent: 5.34 %
Brady Statistic RV Percent Paced: 94.66 %
Date Time Interrogation Session: 20200108144511
Implantable Lead Implant Date: 20180927
Implantable Lead Implant Date: 20180927
Implantable Lead Location: 753859
Implantable Lead Location: 753860
Implantable Lead Model: 5076
Lead Channel Impedance Value: 266 Ohm
Lead Channel Impedance Value: 304 Ohm
Lead Channel Impedance Value: 323 Ohm
Lead Channel Impedance Value: 380 Ohm
Lead Channel Pacing Threshold Amplitude: 0.5 V
Lead Channel Pacing Threshold Pulse Width: 0.4 ms
Lead Channel Sensing Intrinsic Amplitude: 7.875 mV
Lead Channel Sensing Intrinsic Amplitude: 7.875 mV
Lead Channel Setting Pacing Amplitude: 2.5 V
Lead Channel Setting Pacing Pulse Width: 0.4 ms
Lead Channel Setting Sensing Sensitivity: 1.2 mV
MDC IDC MSMT BATTERY REMAINING LONGEVITY: 127 mo
MDC IDC MSMT LEADCHNL RA SENSING INTR AMPL: 1.25 mV
MDC IDC MSMT LEADCHNL RA SENSING INTR AMPL: 1.375 mV
MDC IDC PG IMPLANT DT: 20180927
MDC IDC STAT BRADY RA PERCENT PACED: 0 %

## 2018-05-21 NOTE — Progress Notes (Signed)
Remote pacemaker transmission.   

## 2018-05-22 DIAGNOSIS — C44320 Squamous cell carcinoma of skin of unspecified parts of face: Secondary | ICD-10-CM | POA: Diagnosis not present

## 2018-05-23 DIAGNOSIS — C44320 Squamous cell carcinoma of skin of unspecified parts of face: Secondary | ICD-10-CM | POA: Diagnosis not present

## 2018-05-26 DIAGNOSIS — C4442 Squamous cell carcinoma of skin of scalp and neck: Secondary | ICD-10-CM | POA: Diagnosis not present

## 2018-05-26 DIAGNOSIS — C44320 Squamous cell carcinoma of skin of unspecified parts of face: Secondary | ICD-10-CM | POA: Diagnosis not present

## 2018-05-27 DIAGNOSIS — C44320 Squamous cell carcinoma of skin of unspecified parts of face: Secondary | ICD-10-CM | POA: Diagnosis not present

## 2018-05-28 DIAGNOSIS — C44320 Squamous cell carcinoma of skin of unspecified parts of face: Secondary | ICD-10-CM | POA: Diagnosis not present

## 2018-05-29 ENCOUNTER — Other Ambulatory Visit: Payer: Self-pay

## 2018-05-29 ENCOUNTER — Inpatient Hospital Stay (HOSPITAL_COMMUNITY): Payer: Medicare Other | Attending: Hematology | Admitting: Hematology

## 2018-05-29 ENCOUNTER — Inpatient Hospital Stay (HOSPITAL_COMMUNITY): Payer: Medicare Other

## 2018-05-29 ENCOUNTER — Inpatient Hospital Stay (HOSPITAL_COMMUNITY): Payer: Medicare Other | Attending: Hematology

## 2018-05-29 ENCOUNTER — Encounter (HOSPITAL_COMMUNITY): Payer: Self-pay | Admitting: Hematology

## 2018-05-29 VITALS — BP 130/71 | HR 90 | Temp 97.7°F | Resp 18 | Wt 118.0 lb

## 2018-05-29 DIAGNOSIS — C9110 Chronic lymphocytic leukemia of B-cell type not having achieved remission: Secondary | ICD-10-CM | POA: Insufficient documentation

## 2018-05-29 DIAGNOSIS — C44329 Squamous cell carcinoma of skin of other parts of face: Secondary | ICD-10-CM | POA: Insufficient documentation

## 2018-05-29 DIAGNOSIS — Z9221 Personal history of antineoplastic chemotherapy: Secondary | ICD-10-CM | POA: Insufficient documentation

## 2018-05-29 DIAGNOSIS — Z79899 Other long term (current) drug therapy: Secondary | ICD-10-CM | POA: Diagnosis not present

## 2018-05-29 DIAGNOSIS — Z923 Personal history of irradiation: Secondary | ICD-10-CM

## 2018-05-29 DIAGNOSIS — Z87891 Personal history of nicotine dependence: Secondary | ICD-10-CM | POA: Insufficient documentation

## 2018-05-29 DIAGNOSIS — E538 Deficiency of other specified B group vitamins: Secondary | ICD-10-CM

## 2018-05-29 DIAGNOSIS — C44320 Squamous cell carcinoma of skin of unspecified parts of face: Secondary | ICD-10-CM | POA: Diagnosis not present

## 2018-05-29 LAB — COMPREHENSIVE METABOLIC PANEL
ALT: 16 U/L (ref 0–44)
AST: 25 U/L (ref 15–41)
Albumin: 2.9 g/dL — ABNORMAL LOW (ref 3.5–5.0)
Alkaline Phosphatase: 52 U/L (ref 38–126)
Anion gap: 7 (ref 5–15)
BUN: 21 mg/dL (ref 8–23)
CO2: 25 mmol/L (ref 22–32)
Calcium: 8.4 mg/dL — ABNORMAL LOW (ref 8.9–10.3)
Chloride: 102 mmol/L (ref 98–111)
Creatinine, Ser: 1.56 mg/dL — ABNORMAL HIGH (ref 0.61–1.24)
GFR calc Af Amer: 48 mL/min — ABNORMAL LOW (ref >60)
GFR calc non Af Amer: 41 mL/min — ABNORMAL LOW (ref >60)
GLUCOSE: 110 mg/dL — AB (ref 70–99)
Potassium: 4.4 mmol/L (ref 3.5–5.1)
Sodium: 134 mmol/L — ABNORMAL LOW (ref 135–145)
Total Bilirubin: 0.5 mg/dL (ref 0.3–1.2)
Total Protein: 7.1 g/dL (ref 6.5–8.1)

## 2018-05-29 LAB — CBC WITH DIFFERENTIAL/PLATELET
Abs Immature Granulocytes: 0.09 10*3/uL — ABNORMAL HIGH (ref 0.00–0.07)
BASOS PCT: 0 %
Basophils Absolute: 0.1 10*3/uL (ref 0.0–0.1)
Eosinophils Absolute: 0.2 10*3/uL (ref 0.0–0.5)
Eosinophils Relative: 1 %
HCT: 34.4 % — ABNORMAL LOW (ref 39.0–52.0)
Hemoglobin: 10.3 g/dL — ABNORMAL LOW (ref 13.0–17.0)
Immature Granulocytes: 1 %
Lymphocytes Relative: 89 %
Lymphs Abs: 17 10*3/uL — ABNORMAL HIGH (ref 0.7–4.0)
MCH: 32.5 pg (ref 26.0–34.0)
MCHC: 29.9 g/dL — AB (ref 30.0–36.0)
MCV: 108.5 fL — ABNORMAL HIGH (ref 80.0–100.0)
Monocytes Absolute: 0.3 10*3/uL (ref 0.1–1.0)
Monocytes Relative: 2 %
NEUTROS ABS: 1.4 10*3/uL — AB (ref 1.7–7.7)
Neutrophils Relative %: 7 %
Platelets: 124 10*3/uL — ABNORMAL LOW (ref 150–400)
RBC: 3.17 MIL/uL — ABNORMAL LOW (ref 4.22–5.81)
RDW: 18.2 % — ABNORMAL HIGH (ref 11.5–15.5)
WBC: 18.9 10*3/uL — ABNORMAL HIGH (ref 4.0–10.5)
nRBC: 0 % (ref 0.0–0.2)

## 2018-05-29 LAB — LACTATE DEHYDROGENASE: LDH: 184 U/L (ref 98–192)

## 2018-05-29 LAB — MAGNESIUM: MAGNESIUM: 1.8 mg/dL (ref 1.7–2.4)

## 2018-05-29 MED ORDER — CYANOCOBALAMIN 1000 MCG/ML IJ SOLN
1000.0000 ug | Freq: Once | INTRAMUSCULAR | Status: AC
  Administered 2018-05-29: 1000 ug via INTRAMUSCULAR

## 2018-05-29 MED ORDER — CYANOCOBALAMIN 1000 MCG/ML IJ SOLN
INTRAMUSCULAR | Status: AC
  Filled 2018-05-29: qty 1

## 2018-05-29 NOTE — Patient Instructions (Signed)
[place] at [facility] Discharge Instructions  Follow up in 3-4 weeks with labs    Thank you for choosing [place] at [facility] to provide your oncology and hematology care.  To afford each patient quality time with our provider, please arrive at least 15 minutes before your scheduled appointment time.   If you have a lab appointment with the [facility] please come in thru the  Main Entrance and check in at the main information desk  You need to re-schedule your appointment should you arrive 10 or more minutes late.  We strive to give you quality time with our providers, and arriving late affects you and other patients whose appointments are after yours.  Also, if you no show three or more times for appointments you may be dismissed from the clinic at the providers discretion.     Again, thank you for choosing [facility].  Our hope is that these requests will decrease the amount of time that you wait before being seen by our physicians.       _____________________________________________________________  Should you have questions after your visit to [facility], please contact our office at [phone] between the hours of 8:00 a.m. and 4:30 p.m.  Voicemails left after 4:00 p.m. will not be returned until the following business day.  For prescription refill requests, have your pharmacy contact our office and allow 72 hours.    Cancer Center Support Programs:   > Cancer Support Group  2nd Tuesday of the month 1pm-2pm, Journey Room

## 2018-05-29 NOTE — Progress Notes (Signed)
[place], [place] [address]   CLINIC:  Medical Oncology/Hematology  PCP:  [name], MD [place] [address] [phone]   REASON FOR VISIT: Follow-up for CLL  CURRENT THERAPY:Ibrutinib 280 mg daily  BRIEF ONCOLOGIC HISTORY:    CLL (chronic lymphocytic leukemia) ([place])   02/14/2005 Procedure    Peripheral flow cytometry- CLL (without 11q or 17 p deletion) presenting with stage 0 disease    10/25/2008 Progression    Increasing WBC and symptoms suggestive of progressive disease    11/09/2008 - 01/07/2009 Chemotherapy    Rituxan, fludarabine, cytoxan x 3 cycles with the discontinuation of fludarabine and cytoxan due to severe pancytopenia requiring hospitalization.    01/10/2009 Adverse Reaction    Severe pancytopenia and adverse side effects    02/01/2009 - 04/01/2009 Chemotherapy    Rituxan every 21 days x 3 cycles    04/02/2009 Remission    Stable disease.  Chemotherapy holiday started    06/22/2013 - 06/29/2013 Chemotherapy    Imbruvica daily    06/29/2013 Adverse Reaction    Large right posterior leg (popliteal) ecchymosis/violaceous rash.  Likely Imbruvica-induced    07/31/2013 - 10/12/2013 Chemotherapy    Zydelig (idelalisib) 150 mg BID monotherapy    10/12/2013 Adverse Reaction    Transaminitis, suspected to be secondary to [place].  Dose decreased.    10/12/2013 - 11/09/2013 Chemotherapy    Zydelig (idelalisib) 150 mg daily.    11/09/2013 Adverse Reaction    Worsening transaminitis.  Zydelig on HOLD    11/09/2013 Treatment Plan Change    Hold Zydelig    12/28/2013 Adverse Reaction    Herpes Zoster outbreak.  Partial response with Famvir x 1 week, switched to acyclovir.  Progression over 1-2 weeks with development of keratoconjunctivitis bilaterally.  VZ Immune Globulin 625 mg IM given on 01/13/14.    02/08/2014 Treatment Plan Change    Ibrutinib restarted 140 mg daily for one week followed by 280 mg daily  followed by 420 mg daily with prophylactic acyclovir.    02/08/2014 - 07/05/2014 Chemotherapy    Ibrutinib 420 mg daily    [id number] - 04/17/2014 Hospital Admission    Right lower lobe pneumonia    07/05/2014 Adverse Reaction    Fissuring of fingertips and sore, red feet with transaminitis    07/05/2014 Treatment Plan Change    Hold Ibrutinib    07/22/2014 - 08/19/2014 Chemotherapy    Ibrutinib restarted at 280 mg daily.    08/09/2014 - 08/12/2014 Hospital Admission    Bilateral pneumonia    08/19/2014 Adverse Reaction    Nausea with vomiting.  Escorted to ED.    08/19/2014 Treatment Plan Change    Ibrutinib on hold- ED escort from clinic     10/18/2014 Miscellaneous    B12 weekly x 4 then monthly.    12/15/2015 Imaging    Bone density- BMD as determined from Femur Neck Left is 0.803 g/cm2 with a T-score of -2.1. This patient is considered osteopenic by World Healh Organization (WHO) Criteria.    12/16/2015 Imaging    CT abd/pelvis-  Progression of retroperitoneal and portacaval adenopathy compared to the prior study. Likely related to CLL.  Small hiatal hernia  Mild compression fractures T11 and T12, probably old. Correlate with any pain in this area.    05/22/2016 - 05/28/2016 Chemotherapy    BR day 1 and 2 x 1 cycle.  Bendamustine dose reduced by 50%.  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    01/28/2017 -  Chemotherapy    Cycle 1 day 1 of Gayzva and Chlorambucil. Rasburicase 3 mg given also.     01/28/2017 - 02/08/2017 Hospital Admission    Admitted for sycopal episode due to complete heart block. Patient went into PEA arrest requiring CPR and intubation. 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 Pacemaker placed while inpatient. Started on  eliquis.      INTERVAL HISTORY:  Mr. Lauricella 83 y.o. male returns for routine follow-up CLL. He is here today with his wife. He is still taking radiation and will not be done till feb 26th. He still has constipation issues. Denies any nausea, vomiting, or diarrhea. Denies any new pains. Had not noticed any recent bleeding such as epistaxis, hematuria or hematochezia. Denies recent chest pain on exertion, shortness of breath on minimal exertion, pre-syncopal episodes, or palpitations. Denies any numbness or tingling in hands or feet. Denies any recent fevers, infections, or recent hospitalizations. Patient reports appetite at 50% and energy level at 25%.    REVIEW OF SYSTEMS:  Review of Systems  Gastrointestinal: Positive for constipation.  All other systems reviewed and are negative.    PAST MEDICAL/SURGICAL HISTORY:  Past Medical History:  Diagnosis Date  . Anemia   . B12 deficiency 12/07/2014  . Chronic kidney disease    CKD III  . CLL (chronic lymphocytic leukemia) ([place]) 01/24/2011  . Diabetes mellitus   . DM (diabetes mellitus) ([place]) 01/24/2011  . Eczema 01/24/2011  . History of pneumonia 04/2014  . Hypertension   . Leukemia ([place]) 6.22.2012   PER PATIENT  . Melanoma in situ ([place]) 01/24/2011  . Mild obesity 01/24/2011  . Pneumonia    2018  . Port catheter in place 05/02/2012  . Presence of permanent cardiac pacemaker    [employer]  . Shingles   . Ulcer    Past Surgical History:  Procedure Laterality Date  . bleeding ulcer    . CATARACT EXTRACTION W/PHACO  05/05/2012   Procedure: CATARACT EXTRACTION PHACO AND INTRAOCULAR LENS PLACEMENT (IOC);  Surgeon: Tonny Branch, MD;  Location: [facility];  Service: Ophthalmology;  Laterality: Right;  CDE:13.25  . CATARACT EXTRACTION W/PHACO  05/15/2012   Procedure: CATARACT EXTRACTION PHACO AND INTRAOCULAR LENS PLACEMENT (IOC);  Surgeon: Tonny Branch, MD;  Location: [facility];  Service: Ophthalmology;  Laterality: Left;  CDE:  12.32  .  COLONOSCOPY N/A 01/30/2016   Procedure: COLONOSCOPY;  Surgeon: Danie Binder, MD;  Location: [facility];  Service: Endoscopy;  Laterality: N/A;  2:00 PM - moved to 1:00 - office notified pt  . ESOPHAGOGASTRODUODENOSCOPY  2008   Dr. Oneida Alar: normal esophagus, antral erythema, 1 cm clean based duodenal ulcera, negative H. plyori  . ESOPHAGOGASTRODUODENOSCOPY N/A 01/30/2016   Procedure: ESOPHAGOGASTRODUODENOSCOPY (EGD);  Surgeon: Danie Binder, MD;  Location: [facility];  Service: Endoscopy;  Laterality: N/A;  . EXCISION MASS HEAD Left 03/10/2018   Procedure: EXCISION MASS HEAD;  Surgeon: Melissa Montane, MD;  Location: [facility];  Service: ENT;  Laterality: Left;  . EYE SURGERY    . HERNIA REPAIR  2001  . PACEMAKER IMPLANT N/A 02/07/2017   Procedure: PACEMAKER IMPLANT;  Surgeon: Constance Haw, MD;  Location: [facility];  Service: Cardiovascular;  Laterality: N/A;  .  PAROTIDECTOMY Left 03/10/2018   LEFT SIDE PAROTIDECTOMTY AND NECK DISECTION WITH RESECTION OF A FOREHEAD LESION  . PAROTIDECTOMY Left 03/10/2018   Procedure: LEFT SIDE PAROTIDECTOMTY AND NECK DISECTION WITH RESECTION OF A FOREHEAD LESION;  Surgeon: Melissa Montane, MD;  Location: [facility];  Service: ENT;  Laterality: Left;  . PORTACATH PLACEMENT  2008  . SKIN FULL THICKNESS GRAFT Left 03/10/2018   Procedure: SKIN GRAFT FULL THICKNESS;  Surgeon: Melissa Montane, MD;  Location: [facility];  Service: ENT;  Laterality: Left;  . TONSILLECTOMY     removed around 13-82 years of age     SOCIAL HISTORY:  Social History   Socioeconomic History  . Marital status: Married    Spouse name: Not on file  . Number of children: Not on file  . Years of education: Not on file  . Highest education level: Not on file  Occupational History  . Not on file  Social Needs  . Financial resource strain: Not on file  . Food insecurity:    Worry: Not on file    Inability: Not on file  . Transportation needs:    Medical: Not on file    Non-medical: Not  on file  Tobacco Use  . Smoking status: Former Smoker    Last attempt to quit: 09/11/1977    Years since quitting: 40.7  . Smokeless tobacco: Former Systems developer    Quit date: 04/25/1967  Substance and Sexual Activity  . Alcohol use: No  . Drug use: No  . Sexual activity: Not on file  Lifestyle  . Physical activity:    Days per week: Not on file    Minutes per session: Not on file  . Stress: Not on file  Relationships  . Social connections:    Talks on phone: Not on file    Gets together: Not on file    Attends religious service: Not on file    Active member of club or organization: Not on file    Attends meetings of clubs or organizations: Not on file    Relationship status: Not on file  . Intimate partner violence:    Fear of current or ex partner: Not on file    Emotionally abused: Not on file    Physically abused: Not on file    Forced sexual activity: Not on file  Other Topics Concern  . Not on file  Social History Narrative  . Not on file    FAMILY HISTORY:  Family History  Problem Relation Age of Onset  . Diabetes Father   . Colon cancer Neg Hx     CURRENT MEDICATIONS:  Outpatient Encounter Medications as of 05/29/2018  Medication Sig  . ACCU-CHEK AVIVA PLUS test strip   . acetaminophen (TYLENOL) 500 MG tablet Take 500 mg by mouth every 6 (six) hours as needed for moderate pain or headache.  . allopurinol (ZYLOPRIM) 100 MG tablet Take 1 tablet (100 mg total) by mouth daily.  . feeding supplement, ENSURE ENLIVE, (ENSURE ENLIVE) LIQD Take 237 mLs by mouth 2 (two) times daily between meals. (Patient taking differently: Take 237 mLs by mouth daily. )  . ibrutinib (IMBRUVICA) 140 MG capsul Take 2 capsules (280 mg total) by mouth daily.  [name] JANUVIA 25 MG tablet Take 25 mg by mouth daily.  [name] levETIRAcetam (KEPPRA) 100 MG/ML solution Take 7.5 mLs (750 mg total) by mouth 2 (two) times daily.  . rosuvastatin (CRESTOR) 10 MG tablet Take 10 mg by mouth at bedtime.  [name]  sodium  bicarbonate 650 MG tablet Take 650 mg by mouth 2 (two) times daily.   . vitamin C (ASCORBIC ACID) 500 MG tablet Take 500 mg by mouth daily.  . [DISCONTINUED] cholecalciferol (VITAMIN D) 400 units TABS tablet Take 400 Units by mouth daily.  . [DISCONTINUED] levofloxacin (LEVAQUIN) 250 MG tablet Take 1 tablet (250 mg total) by mouth daily. Take 2 tablets on the first day then take one tablet daily for the remaining 6 days.  [name] docusate sodium (COLACE) 100 MG capsule Take 100 mg by mouth daily as needed for mild constipation.    Facility-Administered Encounter Medications as of 05/29/2018  Medication  . heparin lock flush 100 unit/mL  . sodium chloride flush (NS) 0.9 % injection 10 mL  . sodium polystyrene (KAYEXALATE) 15 GM/60ML suspension 30 g    ALLERGIES:  Allergies  Allergen Reactions  . Aspirin Other (See Comments)    Bleeding ulcers.   . Ibuprofen Other (See Comments)    Bleeding ulcers.     PHYSICAL EXAM:  ECOG Performance status: 1  Vitals:   05/29/18 1000  BP: 130/71  Pulse: 90  Resp: 18  Temp: 97.7 F (36.5 C)  SpO2: 95%   Filed Weights   05/29/18 1000  Weight: 118 lb (53.5 kg)    Physical Exam Constitutional:      Appearance: Normal appearance. He is normal weight.  Neck:     Musculoskeletal: Normal range of motion and neck supple.  Musculoskeletal: Normal range of motion.  Skin:    General: Skin is warm and dry.  Neurological:     Mental Status: He is alert and oriented to person, place, and time. Mental status is at baseline.  Psychiatric:        Mood and Affect: Mood normal.        Behavior: Behavior normal.        Thought Content: Thought content normal.    No palpable adenopathy in the neck region.  There is lymphedema and the left cheek area.  LABORATORY DATA:  I have reviewed the labs as listed.  CBC    Component Value Date/Time   WBC 18.9 (H) 05/29/2018 0954   RBC 3.17 (L) 05/29/2018 0954   HGB 10.3 (L) 05/29/2018 0954   HCT 34.4 (L)  05/29/2018 0954   PLT 124 (L) 05/29/2018 0954   MCV 108.5 (H) 05/29/2018 0954   MCH 32.5 05/29/2018 0954   MCHC 29.9 (L) 05/29/2018 0954   RDW 18.2 (H) 05/29/2018 0954   LYMPHSABS 17.0 (H) 05/29/2018 0954   MONOABS 0.3 05/29/2018 0954   EOSABS 0.2 05/29/2018 0954   BASOSABS 0.1 05/29/2018 0954   CMP Latest Ref Rng & Units 05/29/2018 05/06/2018 04/15/2018  Glucose 70 - 99 mg/dL 110(H) 133(H) 93  BUN 8 - 23 mg/dL 21 28(H) 27(H)  Creatinine 0.61 - 1.24 mg/dL 1.56(H) 1.85(H) 2.05(H)  Sodium 135 - 145 mmol/L 134(L) 135 136  Potassium 3.5 - 5.1 mmol/L 4.4 4.0 4.3  Chloride 98 - 111 mmol/L 102 101 102  CO2 22 - 32 mmol/L 25 27 27   Calcium 8.9 - 10.3 mg/dL 8.4(L) 8.7(L) 9.4  Total Protein 6.5 - 8.1 g/dL 7.1 7.8 7.7  Total Bilirubin 0.3 - 1.2 mg/dL 0.5 0.5 0.4  Alkaline Phos 38 - 126 U/L 52 76 107  AST 15 - 41 U/L 25 25 48(H)  ALT 0 - 44 U/L 16 18 48(H)       DIAGNOSTIC IMAGING:  I have independently reviewed the scans  and discussed with the patient.   I have reviewed Francene Finders, NP's note and agree with the documentation.  I personally performed a face-to-face visit, made revisions and my assessment and plan is as follows.    ASSESSMENT & PLAN:   CLL (chronic lymphocytic leukemia) ([place]) 1. Stage IV CLL with history of poor tolerance to past chemotherapies: -Could not tolerate past treatments including FCR in 2010, Imbruvica 2015 (located by large leg rash), Zydelig 7654 (complicated by transaminitis despite dose reduction), Imbruvica rechallenge in 6503 (complicated by infection and progressive fatigue), bendamustine/Rituxan 1 cycle in 5465 (complicated by prolonged and significant cytopenia in addition to tumor lysis syndrome and renal failure).  He could not even tolerate last treatment with obinutuzumab and chlorambucil. -He reports recent onset night sweats, tiredness on exertion and 15 pound weight loss in the last 1 month. -Ibrutinib 140 mg daily started on 11/22/2017 after  TLS prophylaxis with rasburicase.  He was hospitalized for the next 2 days for observation with continuous IV fluids and lab monitoring.  He did not develop any tumor lysis syndrome.  He received 1 unit of blood transfusion yesterday prior to discharge. - Ibrutinib was increased to 2 tablets daily on 11/29/2017.  He has noticed easy bruising.  His tumor lysis syndrome labs were within normal limits. - He is continuing to get a good response with downtrending white cell count.  He received 1 unit of blood transfusion on 12/13/2017. - He is continuing ibrutinib at this time. - Needle biopsy by Dr. Janace Hoard of the left parotid mass shows squamous cell carcinoma on 01/09/2018 - PET/CT scan on 01/10/2018 showed 3 hypermetabolic lesions associated with left parotid gland, largest bulges the skin surface.  Additional necrotic level 2 lymph node.  -The PET scan also showed mildly enlarged axillary, mediastinal, periaortic lymph nodes from his CLL.  There is also focal hypermetabolic activity along the gastric fundus and distal esophagus.  This is likely gastritis.  He had a history of peptic ulcer. - Ibrutinib was restarted back after wide local excision of the temporal lesion on 03/02/2018. -He is continuing to tolerate ibrutinib without any problems.  No bleeding issues reported. -We reviewed his blood work today.  His white count improved to 18.  Platelet count was 124.  Creatinine is also better at 1.5. - He has developed lymphedema of the left cheek since his surgery.  Physical examination did not reveal any palpable masses in the head and neck region. - He will continue ibrutinib at the same dose.  We will see him back in 4 weeks with repeat blood counts.  2.  Left temporal squamous cell carcinoma: -Biopsy of the left forehead on 03/21/2017 was consistent with moderately differentiated squamous cell carcinoma.  Fine-needle aspiration of the parotid gland on 01/09/2018 was consistent with squamous cell carcinoma.   He follows up with Dr. Janace Hoard for extensive resection. - He had wide local excision on 03/10/2018.  -Pathology showed invasive squamous cell carcinoma, multiple positive margins, 11 out of 12 lymph nodes positive for squamous cell carcinoma, multifocal extranodal extension, invasive carcinoma involving attached parotid gland. -He was evaluated by Dr.Yanagihara.  A simulation scan was done. - Unfortunately skin graft on the scalp did not take.  He will need surgical revision.  Skin incision in the left preauricular region healed well. - Cemiplimab is FDA approved for locally advanced squamous cell carcinoma.  However we do not know if the combination of ibrutinib and Cemiplimab is safe. - He was started on  radiation therapy on 05/19/2018.  He is tolerating it very well.             Orders placed this encounter:  Orders Placed This Encounter  Procedures  . Phosphorus  . Magnesium  . CBC with Differential/Platelet  . Comprehensive metabolic panel  . Uric acid  . Lactate dehydrogenase      Derek Jack, MD [place] [phone]

## 2018-05-29 NOTE — Assessment & Plan Note (Signed)
1. Stage IV CLL with history of poor tolerance to past chemotherapies: -Could not tolerate past treatments including FCR in 2010, Imbruvica 2015 (located by large leg rash), Zydelig 1610 (complicated by transaminitis despite dose reduction), Imbruvica rechallenge in 9604 (complicated by infection and progressive fatigue), bendamustine/Rituxan 1 cycle in 5409 (complicated by prolonged and significant cytopenia in addition to tumor lysis syndrome and renal failure).  He could not even tolerate last treatment with obinutuzumab and chlorambucil. -He reports recent onset night sweats, tiredness on exertion and 15 pound weight loss in the last 1 month. -Ibrutinib 140 mg daily started on 11/22/2017 after TLS prophylaxis with rasburicase.  He was hospitalized for the next 2 days for observation with continuous IV fluids and lab monitoring.  He did not develop any tumor lysis syndrome.  He received 1 unit of blood transfusion yesterday prior to discharge. - Ibrutinib was increased to 2 tablets daily on 11/29/2017.  He has noticed easy bruising.  His tumor lysis syndrome labs were within normal limits. - He is continuing to get a good response with downtrending white cell count.  He received 1 unit of blood transfusion on 12/13/2017. - He is continuing ibrutinib at this time. - Needle biopsy by Dr. Janace Hoard of the left parotid mass shows squamous cell carcinoma on 01/09/2018 - PET/CT scan on 01/10/2018 showed 3 hypermetabolic lesions associated with left parotid gland, largest bulges the skin surface.  Additional necrotic level 2 lymph node.  -The PET scan also showed mildly enlarged axillary, mediastinal, periaortic lymph nodes from his CLL.  There is also focal hypermetabolic activity along the gastric fundus and distal esophagus.  This is likely gastritis.  He had a history of peptic ulcer. - Ibrutinib was restarted back after wide local excision of the temporal lesion on 03/02/2018. -He is continuing to tolerate  ibrutinib without any problems.  No bleeding issues reported. -We reviewed his blood work today.  His white count improved to 18.  Platelet count was 124.  Creatinine is also better at 1.5. - He has developed lymphedema of the left cheek since his surgery.  Physical examination did not reveal any palpable masses in the head and neck region. - He will continue ibrutinib at the same dose.  We will see him back in 4 weeks with repeat blood counts.  2.  Left temporal squamous cell carcinoma: -Biopsy of the left forehead on 03/21/2017 was consistent with moderately differentiated squamous cell carcinoma.  Fine-needle aspiration of the parotid gland on 01/09/2018 was consistent with squamous cell carcinoma.  He follows up with Dr. Janace Hoard for extensive resection. - He had wide local excision on 03/10/2018.  -Pathology showed invasive squamous cell carcinoma, multiple positive margins, 11 out of 12 lymph nodes positive for squamous cell carcinoma, multifocal extranodal extension, invasive carcinoma involving attached parotid gland. -He was evaluated by Dr.Yanagihara.  A simulation scan was done. - Unfortunately skin graft on the scalp did not take.  He will need surgical revision.  Skin incision in the left preauricular region healed well. - Cemiplimab is FDA approved for locally advanced squamous cell carcinoma.  However we do not know if the combination of ibrutinib and Cemiplimab is safe. - He was started on radiation therapy on 05/19/2018.  He is tolerating it very well.

## 2018-05-30 DIAGNOSIS — C44320 Squamous cell carcinoma of skin of unspecified parts of face: Secondary | ICD-10-CM | POA: Diagnosis not present

## 2018-06-03 DIAGNOSIS — C44320 Squamous cell carcinoma of skin of unspecified parts of face: Secondary | ICD-10-CM | POA: Diagnosis not present

## 2018-06-03 DIAGNOSIS — C4442 Squamous cell carcinoma of skin of scalp and neck: Secondary | ICD-10-CM | POA: Diagnosis not present

## 2018-06-04 DIAGNOSIS — C44320 Squamous cell carcinoma of skin of unspecified parts of face: Secondary | ICD-10-CM | POA: Diagnosis not present

## 2018-06-05 DIAGNOSIS — C44320 Squamous cell carcinoma of skin of unspecified parts of face: Secondary | ICD-10-CM | POA: Diagnosis not present

## 2018-06-06 DIAGNOSIS — J060 Acute laryngopharyngitis: Secondary | ICD-10-CM | POA: Diagnosis not present

## 2018-06-06 DIAGNOSIS — C44320 Squamous cell carcinoma of skin of unspecified parts of face: Secondary | ICD-10-CM | POA: Diagnosis not present

## 2018-06-07 ENCOUNTER — Encounter (HOSPITAL_COMMUNITY): Payer: Self-pay

## 2018-06-07 ENCOUNTER — Emergency Department (HOSPITAL_COMMUNITY): Payer: Medicare Other

## 2018-06-07 ENCOUNTER — Other Ambulatory Visit: Payer: Self-pay

## 2018-06-07 ENCOUNTER — Inpatient Hospital Stay (HOSPITAL_COMMUNITY)
Admission: EM | Admit: 2018-06-07 | Discharge: 2018-06-14 | DRG: 871 | Disposition: E | Payer: Medicare Other | Attending: Internal Medicine | Admitting: Internal Medicine

## 2018-06-07 DIAGNOSIS — N183 Chronic kidney disease, stage 3 unspecified: Secondary | ICD-10-CM | POA: Diagnosis present

## 2018-06-07 DIAGNOSIS — R569 Unspecified convulsions: Secondary | ICD-10-CM | POA: Diagnosis present

## 2018-06-07 DIAGNOSIS — A419 Sepsis, unspecified organism: Principal | ICD-10-CM | POA: Diagnosis present

## 2018-06-07 DIAGNOSIS — R1310 Dysphagia, unspecified: Secondary | ICD-10-CM | POA: Diagnosis not present

## 2018-06-07 DIAGNOSIS — I129 Hypertensive chronic kidney disease with stage 1 through stage 4 chronic kidney disease, or unspecified chronic kidney disease: Secondary | ICD-10-CM | POA: Diagnosis not present

## 2018-06-07 DIAGNOSIS — I452 Bifascicular block: Secondary | ICD-10-CM | POA: Diagnosis not present

## 2018-06-07 DIAGNOSIS — C9110 Chronic lymphocytic leukemia of B-cell type not having achieved remission: Secondary | ICD-10-CM | POA: Diagnosis present

## 2018-06-07 DIAGNOSIS — D899 Disorder involving the immune mechanism, unspecified: Secondary | ICD-10-CM | POA: Diagnosis not present

## 2018-06-07 DIAGNOSIS — Z886 Allergy status to analgesic agent status: Secondary | ICD-10-CM

## 2018-06-07 DIAGNOSIS — Z7952 Long term (current) use of systemic steroids: Secondary | ICD-10-CM

## 2018-06-07 DIAGNOSIS — Z961 Presence of intraocular lens: Secondary | ICD-10-CM | POA: Diagnosis not present

## 2018-06-07 DIAGNOSIS — R6521 Severe sepsis with septic shock: Secondary | ICD-10-CM | POA: Diagnosis not present

## 2018-06-07 DIAGNOSIS — Z515 Encounter for palliative care: Secondary | ICD-10-CM | POA: Diagnosis not present

## 2018-06-07 DIAGNOSIS — Z79899 Other long term (current) drug therapy: Secondary | ICD-10-CM

## 2018-06-07 DIAGNOSIS — Z87891 Personal history of nicotine dependence: Secondary | ICD-10-CM | POA: Diagnosis not present

## 2018-06-07 DIAGNOSIS — I483 Typical atrial flutter: Secondary | ICD-10-CM | POA: Diagnosis present

## 2018-06-07 DIAGNOSIS — Z950 Presence of cardiac pacemaker: Secondary | ICD-10-CM

## 2018-06-07 DIAGNOSIS — Z7189 Other specified counseling: Secondary | ICD-10-CM | POA: Diagnosis not present

## 2018-06-07 DIAGNOSIS — Z66 Do not resuscitate: Secondary | ICD-10-CM | POA: Diagnosis not present

## 2018-06-07 DIAGNOSIS — D034 Melanoma in situ of scalp and neck: Secondary | ICD-10-CM | POA: Diagnosis not present

## 2018-06-07 DIAGNOSIS — Z7984 Long term (current) use of oral hypoglycemic drugs: Secondary | ICD-10-CM | POA: Diagnosis not present

## 2018-06-07 DIAGNOSIS — E872 Acidosis: Secondary | ICD-10-CM

## 2018-06-07 DIAGNOSIS — I5033 Acute on chronic diastolic (congestive) heart failure: Secondary | ICD-10-CM | POA: Diagnosis not present

## 2018-06-07 DIAGNOSIS — E1122 Type 2 diabetes mellitus with diabetic chronic kidney disease: Secondary | ICD-10-CM | POA: Diagnosis present

## 2018-06-07 DIAGNOSIS — J189 Pneumonia, unspecified organism: Secondary | ICD-10-CM | POA: Diagnosis present

## 2018-06-07 DIAGNOSIS — R4020 Unspecified coma: Secondary | ICD-10-CM | POA: Diagnosis not present

## 2018-06-07 DIAGNOSIS — E860 Dehydration: Secondary | ICD-10-CM | POA: Diagnosis not present

## 2018-06-07 DIAGNOSIS — I130 Hypertensive heart and chronic kidney disease with heart failure and stage 1 through stage 4 chronic kidney disease, or unspecified chronic kidney disease: Secondary | ICD-10-CM | POA: Diagnosis present

## 2018-06-07 DIAGNOSIS — R0689 Other abnormalities of breathing: Secondary | ICD-10-CM | POA: Diagnosis not present

## 2018-06-07 DIAGNOSIS — Y95 Nosocomial condition: Secondary | ICD-10-CM | POA: Diagnosis present

## 2018-06-07 DIAGNOSIS — C4492 Squamous cell carcinoma of skin, unspecified: Secondary | ICD-10-CM | POA: Diagnosis not present

## 2018-06-07 DIAGNOSIS — E440 Moderate protein-calorie malnutrition: Secondary | ICD-10-CM | POA: Diagnosis not present

## 2018-06-07 DIAGNOSIS — R531 Weakness: Secondary | ICD-10-CM | POA: Diagnosis not present

## 2018-06-07 DIAGNOSIS — Z888 Allergy status to other drugs, medicaments and biological substances status: Secondary | ICD-10-CM

## 2018-06-07 DIAGNOSIS — Z6820 Body mass index (BMI) 20.0-20.9, adult: Secondary | ICD-10-CM

## 2018-06-07 DIAGNOSIS — R000 Tachycardia, unspecified: Secondary | ICD-10-CM | POA: Diagnosis not present

## 2018-06-07 DIAGNOSIS — R5081 Fever presenting with conditions classified elsewhere: Secondary | ICD-10-CM | POA: Diagnosis not present

## 2018-06-07 DIAGNOSIS — I4510 Unspecified right bundle-branch block: Secondary | ICD-10-CM | POA: Diagnosis not present

## 2018-06-07 DIAGNOSIS — Z833 Family history of diabetes mellitus: Secondary | ICD-10-CM

## 2018-06-07 DIAGNOSIS — Z9842 Cataract extraction status, left eye: Secondary | ICD-10-CM | POA: Diagnosis not present

## 2018-06-07 DIAGNOSIS — R0602 Shortness of breath: Secondary | ICD-10-CM

## 2018-06-07 DIAGNOSIS — R55 Syncope and collapse: Secondary | ICD-10-CM | POA: Diagnosis not present

## 2018-06-07 DIAGNOSIS — R918 Other nonspecific abnormal finding of lung field: Secondary | ICD-10-CM | POA: Diagnosis not present

## 2018-06-07 DIAGNOSIS — N189 Chronic kidney disease, unspecified: Secondary | ICD-10-CM | POA: Diagnosis not present

## 2018-06-07 DIAGNOSIS — Z923 Personal history of irradiation: Secondary | ICD-10-CM

## 2018-06-07 HISTORY — DX: Carcinoma in situ, unspecified: D09.9

## 2018-06-07 LAB — COMPREHENSIVE METABOLIC PANEL
ALBUMIN: 3 g/dL — AB (ref 3.5–5.0)
ALT: 57 U/L — ABNORMAL HIGH (ref 0–44)
AST: 184 U/L — AB (ref 15–41)
Alkaline Phosphatase: 107 U/L (ref 38–126)
Anion gap: 11 (ref 5–15)
BUN: 33 mg/dL — AB (ref 8–23)
CO2: 22 mmol/L (ref 22–32)
Calcium: 8.6 mg/dL — ABNORMAL LOW (ref 8.9–10.3)
Chloride: 102 mmol/L (ref 98–111)
Creatinine, Ser: 1.97 mg/dL — ABNORMAL HIGH (ref 0.61–1.24)
GFR calc Af Amer: 36 mL/min — ABNORMAL LOW (ref >60)
GFR calc non Af Amer: 31 mL/min — ABNORMAL LOW (ref >60)
Glucose, Bld: 143 mg/dL — ABNORMAL HIGH (ref 70–99)
Potassium: 4.2 mmol/L (ref 3.5–5.1)
Sodium: 135 mmol/L (ref 135–145)
Total Bilirubin: 1.7 mg/dL — ABNORMAL HIGH (ref 0.3–1.2)
Total Protein: 7.8 g/dL (ref 6.5–8.1)

## 2018-06-07 LAB — CBC WITH DIFFERENTIAL/PLATELET
Abs Immature Granulocytes: 0.06 10*3/uL (ref 0.00–0.07)
BASOS ABS: 0.1 10*3/uL (ref 0.0–0.1)
Basophils Relative: 0 %
EOS PCT: 0 %
Eosinophils Absolute: 0 10*3/uL (ref 0.0–0.5)
HCT: 36.1 % — ABNORMAL LOW (ref 39.0–52.0)
Hemoglobin: 11.6 g/dL — ABNORMAL LOW (ref 13.0–17.0)
Immature Granulocytes: 1 %
Lymphocytes Relative: 92 %
Lymphs Abs: 10.3 10*3/uL — ABNORMAL HIGH (ref 0.7–4.0)
MCH: 33.4 pg (ref 26.0–34.0)
MCHC: 32.1 g/dL (ref 30.0–36.0)
MCV: 104 fL — AB (ref 80.0–100.0)
Monocytes Absolute: 0.1 10*3/uL (ref 0.1–1.0)
Monocytes Relative: 1 %
Neutro Abs: 0.7 10*3/uL — ABNORMAL LOW (ref 1.7–7.7)
Neutrophils Relative %: 6 %
Platelets: 90 10*3/uL — ABNORMAL LOW (ref 150–400)
RBC: 3.47 MIL/uL — ABNORMAL LOW (ref 4.22–5.81)
RDW: 18.3 % — ABNORMAL HIGH (ref 11.5–15.5)
WBC: 11.3 10*3/uL — ABNORMAL HIGH (ref 4.0–10.5)
nRBC: 0 % (ref 0.0–0.2)

## 2018-06-07 LAB — URINALYSIS, ROUTINE W REFLEX MICROSCOPIC
Bacteria, UA: NONE SEEN
Bilirubin Urine: NEGATIVE
Glucose, UA: NEGATIVE mg/dL
Hgb urine dipstick: NEGATIVE
Ketones, ur: 5 mg/dL — AB
LEUKOCYTES UA: NEGATIVE
Nitrite: NEGATIVE
Protein, ur: 100 mg/dL — AB
Specific Gravity, Urine: 1.021 (ref 1.005–1.030)
pH: 6 (ref 5.0–8.0)

## 2018-06-07 LAB — PROTIME-INR
INR: 1.22
Prothrombin Time: 15.3 seconds — ABNORMAL HIGH (ref 11.4–15.2)

## 2018-06-07 LAB — BLOOD GAS, VENOUS
Acid-base deficit: 0.5 mmol/L (ref 0.0–2.0)
Bicarbonate: 24.5 mmol/L (ref 20.0–28.0)
FIO2: 28
O2 Content: 2 L/min
O2 Saturation: 93 %
PO2 VEN: 66.1 mmHg — AB (ref 32.0–45.0)
Patient temperature: 40.6
pCO2, Ven: 30.8 mmHg — ABNORMAL LOW (ref 44.0–60.0)
pH, Ven: 7.48 — ABNORMAL HIGH (ref 7.250–7.430)

## 2018-06-07 LAB — LACTIC ACID, PLASMA
Lactic Acid, Venous: 2.6 mmol/L (ref 0.5–1.9)
Lactic Acid, Venous: 1.6 mmol/L (ref 0.5–1.9)

## 2018-06-07 LAB — GLUCOSE, CAPILLARY: Glucose-Capillary: 124 mg/dL — ABNORMAL HIGH (ref 70–99)

## 2018-06-07 LAB — INFLUENZA PANEL BY PCR (TYPE A & B)
Influenza A By PCR: NEGATIVE
Influenza B By PCR: NEGATIVE

## 2018-06-07 LAB — MRSA PCR SCREENING: MRSA by PCR: POSITIVE — AB

## 2018-06-07 MED ORDER — SODIUM CHLORIDE 0.9 % IV BOLUS
1600.0000 mL | Freq: Once | INTRAVENOUS | Status: AC
  Administered 2018-06-07: 1600 mL via INTRAVENOUS

## 2018-06-07 MED ORDER — SODIUM CHLORIDE 0.9 % IV SOLN
INTRAVENOUS | Status: DC
  Administered 2018-06-07 – 2018-06-10 (×7): via INTRAVENOUS

## 2018-06-07 MED ORDER — INSULIN ASPART 100 UNIT/ML ~~LOC~~ SOLN: 0.0000 [IU] | Freq: Every day | SUBCUTANEOUS | Status: DC

## 2018-06-07 MED ORDER — ACETAMINOPHEN 650 MG RE SUPP
650.0000 mg | Freq: Once | RECTAL | Status: AC
  Administered 2018-06-07: 650 mg via RECTAL
  Filled 2018-06-07: qty 1

## 2018-06-07 MED ORDER — ROSUVASTATIN CALCIUM 10 MG PO TABS: 10.0000 mg | ORAL_TABLET | Freq: Every day | ORAL | Status: DC

## 2018-06-07 MED ORDER — HEPARIN SODIUM (PORCINE) 5000 UNIT/ML IJ SOLN
5000.0000 [IU] | Freq: Three times a day (TID) | INTRAMUSCULAR | Status: DC
  Administered 2018-06-07 – 2018-06-10 (×9): 5000 [IU] via SUBCUTANEOUS
  Filled 2018-06-07 (×9): qty 1

## 2018-06-07 MED ORDER — IPRATROPIUM-ALBUTEROL 0.5-2.5 (3) MG/3ML IN SOLN
3.0000 mL | Freq: Four times a day (QID) | RESPIRATORY_TRACT | Status: DC
  Administered 2018-06-07 – 2018-06-09 (×10): 3 mL via RESPIRATORY_TRACT
  Filled 2018-06-07 (×10): qty 3

## 2018-06-07 MED ORDER — VANCOMYCIN HCL IN DEXTROSE 1-5 GM/200ML-% IV SOLN
1000.0000 mg | Freq: Once | INTRAVENOUS | Status: AC
  Administered 2018-06-07: 1000 mg via INTRAVENOUS
  Filled 2018-06-07: qty 200

## 2018-06-07 MED ORDER — METRONIDAZOLE IN NACL 5-0.79 MG/ML-% IV SOLN
500.0000 mg | Freq: Three times a day (TID) | INTRAVENOUS | Status: DC
  Administered 2018-06-07 – 2018-06-10 (×10): 500 mg via INTRAVENOUS
  Filled 2018-06-07 (×10): qty 100

## 2018-06-07 MED ORDER — LEVETIRACETAM 100 MG/ML PO SOLN
750.0000 mg | Freq: Two times a day (BID) | ORAL | Status: DC
  Administered 2018-06-07 – 2018-06-09 (×4): 750 mg via ORAL
  Filled 2018-06-07 (×8): qty 7.5

## 2018-06-07 MED ORDER — HYDROCORTISONE NA SUCCINATE PF 100 MG IJ SOLR
100.0000 mg | Freq: Three times a day (TID) | INTRAMUSCULAR | Status: DC
  Administered 2018-06-07 – 2018-06-10 (×9): 100 mg via INTRAVENOUS
  Filled 2018-06-07 (×9): qty 2

## 2018-06-07 MED ORDER — SODIUM CHLORIDE 0.9% FLUSH
3.0000 mL | Freq: Two times a day (BID) | INTRAVENOUS | Status: DC
  Administered 2018-06-07 – 2018-06-10 (×6): 3 mL via INTRAVENOUS

## 2018-06-07 MED ORDER — VANCOMYCIN HCL 500 MG IV SOLR
500.0000 mg | INTRAVENOUS | Status: DC
  Administered 2018-06-08 – 2018-06-10 (×3): 500 mg via INTRAVENOUS
  Filled 2018-06-07 (×5): qty 500

## 2018-06-07 MED ORDER — CHLORHEXIDINE GLUCONATE CLOTH 2 % EX PADS
6.0000 | MEDICATED_PAD | Freq: Every day | CUTANEOUS | Status: DC
  Administered 2018-06-08 – 2018-06-10 (×2): 6 via TOPICAL

## 2018-06-07 MED ORDER — ACETAMINOPHEN 650 MG RE SUPP
650.0000 mg | RECTAL | Status: DC | PRN
  Administered 2018-06-07 – 2018-06-08 (×2): 650 mg via RECTAL
  Filled 2018-06-07 (×2): qty 1

## 2018-06-07 MED ORDER — INSULIN ASPART 100 UNIT/ML ~~LOC~~ SOLN
0.0000 [IU] | Freq: Three times a day (TID) | SUBCUTANEOUS | Status: DC
  Administered 2018-06-08: 1 [IU] via SUBCUTANEOUS
  Administered 2018-06-09: 3 [IU] via SUBCUTANEOUS
  Administered 2018-06-09: 5 [IU] via SUBCUTANEOUS
  Administered 2018-06-10 (×2): 1 [IU] via SUBCUTANEOUS

## 2018-06-07 MED ORDER — ORAL CARE MOUTH RINSE
15.0000 mL | Freq: Two times a day (BID) | OROMUCOSAL | Status: DC
  Administered 2018-06-07 – 2018-06-11 (×8): 15 mL via OROMUCOSAL

## 2018-06-07 MED ORDER — BUDESONIDE 0.5 MG/2ML IN SUSP
0.5000 mg | Freq: Two times a day (BID) | RESPIRATORY_TRACT | Status: DC
  Administered 2018-06-07 – 2018-06-10 (×7): 0.5 mg via RESPIRATORY_TRACT
  Filled 2018-06-07 (×7): qty 2

## 2018-06-07 MED ORDER — SODIUM CHLORIDE 0.9 % IV SOLN
1.0000 g | Freq: Two times a day (BID) | INTRAVENOUS | Status: DC
  Administered 2018-06-07 – 2018-06-08 (×3): 1 g via INTRAVENOUS
  Filled 2018-06-07 (×8): qty 1

## 2018-06-07 MED ORDER — MUPIROCIN 2 % EX OINT
1.0000 "application " | TOPICAL_OINTMENT | Freq: Two times a day (BID) | CUTANEOUS | Status: DC
  Administered 2018-06-07 – 2018-06-10 (×6): 1 via NASAL
  Filled 2018-06-07 (×2): qty 22

## 2018-06-07 MED ORDER — SODIUM BICARBONATE 650 MG PO TABS
650.0000 mg | ORAL_TABLET | Freq: Two times a day (BID) | ORAL | Status: DC
  Administered 2018-06-07 – 2018-06-09 (×4): 650 mg via ORAL
  Filled 2018-06-07 (×6): qty 1

## 2018-06-07 MED ORDER — ENSURE ENLIVE PO LIQD
237.0000 mL | Freq: Two times a day (BID) | ORAL | Status: DC
  Administered 2018-06-08 – 2018-06-09 (×4): 237 mL via ORAL

## 2018-06-07 MED ORDER — DM-GUAIFENESIN ER 30-600 MG PO TB12
1.0000 | ORAL_TABLET | Freq: Two times a day (BID) | ORAL | Status: DC
  Administered 2018-06-07 – 2018-06-09 (×4): 1 via ORAL
  Filled 2018-06-07 (×5): qty 1

## 2018-06-07 MED ORDER — METHYLPREDNISOLONE SODIUM SUCC 40 MG IJ SOLR
40.0000 mg | Freq: Two times a day (BID) | INTRAMUSCULAR | Status: DC
  Administered 2018-06-07: 40 mg via INTRAVENOUS
  Filled 2018-06-07: qty 1

## 2018-06-07 MED ORDER — ACETAMINOPHEN 500 MG PO TABS: 1000.0000 mg | ORAL_TABLET | Freq: Once | ORAL | Status: DC

## 2018-06-07 MED ORDER — SODIUM CHLORIDE 0.9 % IV SOLN
2.0000 g | Freq: Once | INTRAVENOUS | Status: AC
  Administered 2018-06-07: 2 g via INTRAVENOUS
  Filled 2018-06-07: qty 2

## 2018-06-07 NOTE — ED Notes (Signed)
Pt was transported to CT.

## 2018-06-07 NOTE — ED Provider Notes (Signed)
[facility] EMERGENCY DEPARTMENT Provider Note   CSN: [id number] Arrival date & time: 05/22/2018  8811     History   Chief Complaint No chief complaint on file.   HPI Randall Reyes is a 83 y.o. male.  HPI Level 5 caveat acuity of situation.  History is obtained from paramedics and from patient's wife and son who accompany him.  Patient had near syncopal event this morning.  He has had diminished appetite for several days and has been coughing for approximately a month.  His wife took his temperature this morning noted to be 103 axillary.  Patient's pulse oximetry in the 80s per EMS.  He was treated with supplemental oxygen while in route. Past Medical History:  Diagnosis Date  . Anemia   . B12 deficiency 12/07/2014  . Chronic kidney disease    CKD III  . CLL (chronic lymphocytic leukemia) ([place]) 01/24/2011  . Diabetes mellitus   . DM (diabetes mellitus) ([place]) 01/24/2011  . Eczema 01/24/2011  . History of pneumonia 04/2014  . Hypertension   . Leukemia ([place]) 6.22.2012   PER PATIENT  . Melanoma in situ ([place]) 01/24/2011  . Mild obesity 01/24/2011  . Pneumonia    2018  . Port catheter in place 05/02/2012  . Presence of permanent cardiac pacemaker    [employer]  . Shingles   . Ulcer     Patient Active Problem List   Diagnosis Date Noted  . Cancer of scalp or skin of neck 03/10/2018  . Sepsis ([place]) 10/08/2017  . Pacemaker 10/04/2017  . CKD (chronic kidney disease), stage III ([place]) 10/04/2017  . Thrombocytopenia ([place]) 10/04/2017  . Facial abscess 10/04/2017  . Typical atrial flutter ([place])   . Bradycardia   . Cardiopulmonary arrest ([place])   . Acute respiratory failure ([place])   . Metabolic acidosis   . Encounter for central line placement   . Encounter for imaging study to confirm orogastric (OG) tube placement   . Syncope 01/28/2017  . Tumor lysis syndrome 01/28/2017  . Bigeminy 05/23/2016  . Goals of care, counseling/discussion 05/22/2016  . Anemia 01/20/2016  .  Special screening for malignant neoplasms, colon 01/20/2016  . HTN (hypertension) 02/01/2015  . B12 deficiency 12/07/2014  . Malnutrition of moderate degree ([place]) 08/11/2014  . HCAP (healthcare-associated pneumonia) 08/10/2014  . AKI (acute kidney injury) ([place]) 08/10/2014  . Hyperkalemia 08/10/2014  . Leukocytosis 1November 25, 202015  . Port catheter in place 05/02/2012  . CLL (chronic lymphocytic leukemia) ([place]) 01/24/2011  . Melanoma in situ ([place]) 01/24/2011  . DM (diabetes mellitus) ([place]) 01/24/2011  . Mild obesity 01/24/2011  . Eczema 01/24/2011    Past Surgical History:  Procedure Laterality Date  . bleeding ulcer    . CATARACT EXTRACTION W/PHACO  05/05/2012   Procedure: CATARACT EXTRACTION PHACO AND INTRAOCULAR LENS PLACEMENT (IOC);  Surgeon: Tonny Branch, MD;  Location: [facility];  Service: Ophthalmology;  Laterality: Right;  CDE:13.25  . CATARACT EXTRACTION W/PHACO  05/15/2012   Procedure: CATARACT EXTRACTION PHACO AND INTRAOCULAR LENS PLACEMENT (IOC);  Surgeon: Tonny Branch, MD;  Location: [facility];  Service: Ophthalmology;  Laterality: Left;  CDE:  12.32  . COLONOSCOPY N/A 01/30/2016   Procedure: COLONOSCOPY;  Surgeon: Danie Binder, MD;  Location: [facility];  Service: Endoscopy;  Laterality: N/A;  2:00 PM - moved to 1:00 - office notified pt  . ESOPHAGOGASTRODUODENOSCOPY  2008   Dr. Oneida Alar: normal esophagus, antral erythema, 1 cm clean based duodenal ulcera, negative H. plyori  . ESOPHAGOGASTRODUODENOSCOPY N/A  01/30/2016   Procedure: ESOPHAGOGASTRODUODENOSCOPY (EGD);  Surgeon: Danie Binder, MD;  Location: [facility];  Service: Endoscopy;  Laterality: N/A;  . EXCISION MASS HEAD Left 03/10/2018   Procedure: EXCISION MASS HEAD;  Surgeon: Melissa Montane, MD;  Location: [place] [facility];  Service: ENT;  Laterality: Left;  . EYE SURGERY    . HERNIA REPAIR  2001  . PACEMAKER IMPLANT N/A 02/07/2017   Procedure: PACEMAKER IMPLANT;  Surgeon: Constance Haw, MD;  Location: [place] [facility];  Service:  Cardiovascular;  Laterality: N/A;  . PAROTIDECTOMY Left 03/10/2018   LEFT SIDE PAROTIDECTOMTY AND NECK DISECTION WITH RESECTION OF A FOREHEAD LESION  . PAROTIDECTOMY Left 03/10/2018   Procedure: LEFT SIDE PAROTIDECTOMTY AND NECK DISECTION WITH RESECTION OF A FOREHEAD LESION;  Surgeon: Melissa Montane, MD;  Location: [place];  Service: ENT;  Laterality: Left;  . PORTACATH PLACEMENT  2008  . SKIN FULL THICKNESS GRAFT Left 03/10/2018   Procedure: SKIN GRAFT FULL THICKNESS;  Surgeon: Melissa Montane, MD;  Location: [facility];  Service: ENT;  Laterality: Left;  . TONSILLECTOMY     removed around 80-94 years of age        Home Medications    Prior to Admission medications   Medication Sig Start Date End Date Taking? Authorizing Provider  ACCU-CHEK AVIVA PLUS test strip  03/06/18   [provider]  acetaminophen (TYLENOL) 500 MG tablet Take 500 mg by mouth every 6 (six) hours as needed for moderate pain or headache.    [provider]  allopurinol (ZYLOPRIM) 100 MG tablet Take 1 tablet (100 mg total) by mouth daily. 11/01/17   Derek Jack, MD  docusate sodium (COLACE) 100 MG capsule Take 100 mg by mouth daily as needed for mild constipation.     [provider]  feeding supplement, ENSURE ENLIVE, (ENSURE ENLIVE) LIQD Take 237 mLs by mouth 2 (two) times daily between meals. Patient taking differently: Take 237 mLs by mouth daily.  10/08/17   Dhungel, Nishant, MD  ibrutinib (IMBRUVICA) 140 MG capsul Take 2 capsules (280 mg total) by mouth daily. 04/23/18   Lockamy, Randi L, NP-C  JANUVIA 25 MG tablet Take 25 mg by mouth daily. 02/28/18   [provider]  levETIRAcetam (KEPPRA) 100 MG/ML solution Take 7.5 mLs (750 mg total) by mouth 2 (two) times daily. 02/08/17   Nita Sells, MD  rosuvastatin (CRESTOR) 10 MG tablet Take 10 mg by mouth at bedtime. 02/27/18   [provider]  sodium bicarbonate 650 MG tablet Take 650 mg by mouth 2 (two) times daily.   04/24/16   [provider]  vitamin C (ASCORBIC ACID) 500 MG tablet Take 500 mg by mouth daily.    [provider]    Family History Family History  Problem Relation Age of Onset  . Diabetes Father   . Colon cancer Neg Hx     Social History Social History   Tobacco Use  . Smoking status: Former Smoker    Last attempt to quit: 09/11/1977    Years since quitting: 40.7  . Smokeless tobacco: Former Systems developer    Quit date: 04/25/1967  Substance Use Topics  . Alcohol use: No  . Drug use: No   DNR CODE STATUS  Allergies   Aspirin and Ibuprofen   Review of Systems Review of Systems  Unable to perform ROS: Acuity of condition  Constitutional: Positive for appetite change and fever.  HENT:       Chronic neck left  sided mouth droop  Respiratory: Positive for cough.   Skin: Positive for wound.       Wound on scalp for past several months  Allergic/Immunologic: Positive for immunocompromised state.     Physical Exam Updated Vital Signs BP 119/73 (BP Location: Right Arm)   Pulse 93   Temp (!) 105.1 F (40.6 C) (Rectal)   Resp (!) 38   Wt 51.3 kg   SpO2 97%   BMI 21.35 kg/m   Physical Exam Vitals signs and nursing note reviewed.  Constitutional:      Appearance: He is well-developed. He is ill-appearing.  HENT:     Head:     Comments: Left-sided mouth droop.  There is approximately 10 cm clean appearing wound with central eschar at left temporoparietal scalp    Mouth/Throat:     Comments: Edentulous mucous membranes dry Eyes:     Conjunctiva/sclera: Conjunctivae normal.     Pupils: Pupils are equal, round, and reactive to light.  Neck:     Musculoskeletal: Neck supple.     Thyroid: No thyromegaly.     Trachea: No tracheal deviation.  Cardiovascular:     Rate and Rhythm: Regular rhythm. Tachycardia present.     Heart sounds: Murmur present.     Comments: Tachycardic.  3/6 systolic murmur Pulmonary:     Comments: Coughing frequently, diffuse  rhonchi Abdominal:     General: Bowel sounds are normal. There is no distension.     Palpations: Abdomen is soft.     Tenderness: There is no abdominal tenderness.  Genitourinary:    Penis: Normal.      Scrotum/Testes: Normal.  Musculoskeletal: Normal range of motion.        General: No tenderness.  Skin:    General: Skin is warm and dry.     Findings: No rash.  Neurological:     Mental Status: He is alert.     Coordination: Coordination normal.      ED Treatments / Results  Labs (all labs ordered are listed, but only abnormal results are displayed) Labs Reviewed  CULTURE, BLOOD (ROUTINE X 2)  CULTURE, BLOOD (ROUTINE X 2)  COMPREHENSIVE METABOLIC PANEL  LACTIC ACID, PLASMA  LACTIC ACID, PLASMA  CBC WITH DIFFERENTIAL/PLATELET  PROTIME-INR  URINALYSIS, ROUTINE W REFLEX MICROSCOPIC    EKG EKG Interpretation  Date/Time:  Saturday June 07 2018 09:27:31 EST Ventricular Rate:  124 PR Interval:    QRS Duration: 128 QT Interval:  298 QTC Calculation: 428 R Axis:   109 Text Interpretation:  Atrial fibrillation RBBB and LPFB ST depr, consider ischemia, inferior leads Minimal ST elevation, lateral leads SINCE LAST TRACING HEART RATE HAS INCREASED Confirmed by Orlie Dakin [phone]) on 06/09/2018 9:41:38 AM   Radiology No results found.  Procedures Procedures (including critical care time)  Medications Ordered in ED Medications  acetaminophen (TYLENOL) suppository 650 mg (has no administration in time range)     Initial Impression / Assessment and Plan / ED Course  I have reviewed the triage vital signs and the nursing notes.  Pertinent labs & imaging results that were available during my care of the patient were reviewed by me and considered in my medical decision making (see chart for details).     Code sepsis called sirs criteria of temperature, respiratory rate.  Source of infection unknown due to chronicity of cough however with frequent cough respiratory  source most likely.   11:15 AM I was notified by nurse at blood pressure is 89/51.  Mean arterial pressure equals 64.  1600 mL normal saline intravenous bolus ordered   11:50 AM patient states "I feel all right" he is awake and alert after treatment with intravenous fluids and intravenous antibiotics Sepsis - Repeat Assessment  Performed at:    1150 am  Vitals     Blood pressure (!) 89/51, pulse 82, temperature (!) 103.8 F (39.9 C), temperature source Rectal, resp. rate (!) 22, weight 51.3 kg, SpO2 (!) 89 %.  Heart:     Regular rate and rhythm  Lungs:    Rhonchi  Capillary Refill:   <2 sec  Peripheral Pulse:   Radial pulse palpable  Skin:     Normal Color  I consulted Dr. Dyann Kief will arrange for admission to stepdown unit  Results for orders placed or performed during the hospital encounter of 06/05/2018  Culture, blood (Routine x 2)  Result Value Ref Range   Specimen Description      BLOOD RIGHT ARM BOTTLES DRAWN AEROBIC AND ANAEROBIC   Special Requests      Blood Culture results may not be optimal due to an inadequate volume of blood received in culture bottles Performed at [facility], [address] [place], [place] [address]    Culture PENDING    Report Status PENDING   Blood Culture (routine x 2)  Result Value Ref Range   Specimen Description      BLOOD RIGHT WRIST BOTTLES DRAWN AEROBIC AND ANAEROBIC   Special Requests      Blood Culture results may not be optimal due to an inadequate volume of blood received in culture bottles Performed at [facility], [address] [place], [place] [address]    Culture PENDING    Report Status PENDING   Comprehensive metabolic panel  Result Value Ref Range   Sodium 135 135 - 145 mmol/L   Potassium 4.2 3.5 - 5.1 mmol/L   Chloride 102 98 - 111 mmol/L   CO2 22 22 - 32 mmol/L   Glucose, Bld 143 (H) 70 - 99 mg/dL   BUN 33 (H) 8 - 23 mg/dL   Creatinine, Ser 1.97 (H) 0.61 - 1.24 mg/dL   Calcium 8.6 (L) 8.9 - 10.3 mg/dL    Total Protein 7.8 6.5 - 8.1 g/dL   Albumin 3.0 (L) 3.5 - 5.0 g/dL   AST 184 (H) 15 - 41 U/L   ALT 57 (H) 0 - 44 U/L   Alkaline Phosphatase 107 38 - 126 U/L   Total Bilirubin 1.7 (H) 0.3 - 1.2 mg/dL   GFR calc non Af Amer 31 (L) >60 mL/min   GFR calc Af Amer 36 (L) >60 mL/min   Anion gap 11 5 - 15  Lactic acid, plasma  Result Value Ref Range   Lactic Acid, Venous 2.6 (HH) 0.5 - 1.9 mmol/L  Lactic acid, plasma  Result Value Ref Range   Lactic Acid, Venous 1.6 0.5 - 1.9 mmol/L  CBC with Differential  Result Value Ref Range   WBC 11.3 (H) 4.0 - 10.5 K/uL   RBC 3.47 (L) 4.22 - 5.81 MIL/uL   Hemoglobin 11.6 (L) 13.0 - 17.0 g/dL   HCT 36.1 (L) 39.0 - 52.0 %   MCV 104.0 (H) 80.0 - 100.0 fL   MCH 33.4 26.0 - 34.0 pg   MCHC 32.1 30.0 - 36.0 g/dL   RDW 18.3 (H) 11.5 - 15.5 %   Platelets 90 (L) 150 - 400 K/uL   nRBC 0.0 0.0 - 0.2 %  Neutrophils Relative % 6 %   Neutro Abs 0.7 (L) 1.7 - 7.7 K/uL   Lymphocytes Relative 92 %   Lymphs Abs 10.3 (H) 0.7 - 4.0 K/uL   Monocytes Relative 1 %   Monocytes Absolute 0.1 0.1 - 1.0 K/uL   Eosinophils Relative 0 %   Eosinophils Absolute 0.0 0.0 - 0.5 K/uL   Basophils Relative 0 %   Basophils Absolute 0.1 0.0 - 0.1 K/uL   WBC Morphology ABSOLUTE LYMPHOCYTOSIS    Immature Granulocytes 1 %   Abs Immature Granulocytes 0.06 0.00 - 0.07 K/uL   Smudge Cells PRESENT   Influenza panel by PCR (type A & B)  Result Value Ref Range   Influenza A By PCR NEGATIVE NEGATIVE   Influenza B By PCR NEGATIVE NEGATIVE  Protime-INR  Result Value Ref Range   Prothrombin Time 15.3 (H) 11.4 - 15.2 seconds   INR 1.22   Blood gas, venous  Result Value Ref Range   FIO2 28.00    O2 Content 2.0 L/min   Delivery systems NASAL CANNULA    pH, Ven 7.480 (H) 7.250 - 7.430   pCO2, Ven 30.8 (L) 44.0 - 60.0 mmHg   pO2, Ven 66.1 (H) 32.0 - 45.0 mmHg   Bicarbonate 24.5 20.0 - 28.0 mmol/L   Acid-base deficit 0.5 0.0 - 2.0 mmol/L   O2 Saturation 93.0 %   Patient temperature  40.6    Dg Chest Port 1 View  Result Date: 06/11/2018 CLINICAL DATA:  Leukemia, cough. EXAM: PORTABLE CHEST 1 VIEW COMPARISON:  Chest x-rays dated 05/06/2018 and 11/16/2017. PET-CT dated 05/05/2018. FINDINGS: New masslike opacity within the RIGHT perihilar lung. LEFT lung remains clear. Heart size and mediastinal contours are stable. LEFT chest wall pacemaker leads appear stable in position. IMPRESSION: New masslike opacity within the RIGHT perihilar lung. This could represent pneumonia, aspiration, or neoplasm. Patient has had a previous RIGHT perihilar pneumonia (chest CT dated 12/31/2017), therefore, favor recurrent pneumonia. Recommend CT chest with contrast for further evaluation. Electronically Signed   By: Franki Cabot M.D.   On: 06/13/2018 10:37  Chest x-ray viewed by me  Lab work is consistent with renal insufficiency which is chronic anemia which is chronic  Final Clinical Impressions(s) / ED Diagnoses  Diagnosis #1 septic shock #2 Healthcare associated pneumonia #3 anemia #4 chronic renal insufficiency  Final diagnoses:  None  CRITICAL CARE Performed by: Orlie Dakin Total critical care time: 45 minutes Critical care time was exclusive of separately billable procedures and treating other patients. Critical care was necessary to treat or prevent imminent or life-threatening deterioration. Critical care was time spent personally by me on the following activities: development of treatment plan with patient and/or surrogate as well as nursing, discussions with consultants, evaluation of patient's response to treatment, examination of patient, obtaining history from patient or surrogate, ordering and performing treatments and interventions, ordering and review of laboratory studies, ordering and review of radiographic studies, pulse oximetry and re-evaluation of patient's condition.  ED Discharge Orders    None       Orlie Dakin, MD 06/11/2018 [phone]

## 2018-06-07 NOTE — ED Notes (Signed)
First set of blood cultures obtained and sent to lab.

## 2018-06-07 NOTE — ED Notes (Signed)
CRITICAL VALUE ALERT  Critical Value: lactic 2.6  Date & Time Notied: 06/06/2018 1023  Provider Notified: Sherran Needs  Orders Received/Actions taken:

## 2018-06-07 NOTE — ED Notes (Signed)
Date and time results received: 05/30/2018 1023 (use smartphrase ".now" to insert current time)  Test: LAC ACID Critical Value: 2.6   Name of Provider Notified: [email or web address]  Orders Received? Or Actions Taken?: SEE CHART

## 2018-06-07 NOTE — Progress Notes (Signed)
Pharmacy Antibiotic Note  Randall Reyes is a 83 y.o. male admitted on 06/04/2018 with sepsis.  Pharmacy has been consulted for cefepime and vancomycin dosing.  Plan:vancomycin 1gm iv x 1  Vancomycin 500mg  IV every 24 hours.  Goal trough 15-20 mcg/mL. cefepime 1gm iv q12h  Weight: 113 lb (51.3 kg)  Temp (24hrs), Avg:105.1 F (40.6 C), Min:105.1 F (40.6 C), Max:105.1 F (40.6 C)  Recent Labs  Lab 06/11/2018 1000  CREATININE 1.97*  LATICACIDVEN 2.6*    Estimated Creatinine Clearance: 21.7 mL/min (A) (by C-G formula based on SCr of 1.97 mg/dL (H)).    Allergies  Allergen Reactions  . Aspirin Other (See Comments)    Bleeding ulcers.   . Ibuprofen Other (See Comments)    Bleeding ulcers.    Antimicrobials this admission: 1/25 cefepime >>  1/25 vancomycin >>  1/25 metronidazole >>    Microbiology results: 1/25 BCx: sent  Thank you for allowing pharmacy to be a part of this patient's care.  Randall Reyes 06/11/2018 10:39 AM

## 2018-06-07 NOTE — H&P (Signed)
History and Physical    Randall Reyes Randall Reyes NAT:[id number] DOB: 10-06-1935 DOA: 05/21/2018  Referring MD/NP/PA: Dr. Winfred Leeds  PCP: [name], MD  Patient coming from: Home  Chief Complaint: Syncope, fever, productive cough, worsening shortness of breath and generalized weakness.  HPI: Randall Reyes is a 83 y.o. male with a past medical history significant for type 2 diabetes mellitus, chronic kidney disease stage III, chronic lymphocytic leukemia, melanoma/squamous cell carcinoma in situ and moderate protein calorie malnutrition; who presented to the hospital secondary to episode of syncope, fever and general malaise.  Patient also reports ongoing coughing spells, that has become productive of yellowish/greenish sputum.  He also expressed associated nausea and decrease in his appetite.  No chest pain, no vomiting, no dysuria, no hematuria, no melena, no hematochezia, no focal weakness, no headaches or any other complaints.  In the ED patient was found with a temperature of 105, tachycardic, respiratory rate in the 30s, soft blood pressure systolic in the low 427'C, elevated lactic acid at 2.6 and a chest x-ray 8 with positive new right lung infiltrates indicating pneumonia.  Cultures were taken, broad-spectrum antibiotics initiated and fluid resuscitation started.  [facility] patient for further evaluation and management.  Past Medical/Surgical History: Past Medical History:  Diagnosis Date  . Anemia   . B12 deficiency 12/07/2014  . Chronic kidney disease    CKD III  . CLL (chronic lymphocytic leukemia) ([place]) 01/24/2011  . Diabetes mellitus   . DM (diabetes mellitus) ([place]) 01/24/2011  . Eczema 01/24/2011  . History of pneumonia 04/2014  . Hypertension   . Leukemia ([place]) 6.22.2012   PER PATIENT  . Melanoma in situ ([place]) 01/24/2011  . Mild obesity 01/24/2011  . Pneumonia    2018  . Port catheter in place 05/02/2012  . Presence of permanent cardiac  pacemaker    [employer]  . Shingles   . 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 Ulcer     Past Surgical History:  Procedure Laterality Date  . bleeding ulcer    . CATARACT EXTRACTION W/PHACO  05/05/2012   Procedure: CATARACT EXTRACTION PHACO AND INTRAOCULAR LENS PLACEMENT (IOC);  Surgeon: Tonny Branch, MD;  Location: [facility];  Service: Ophthalmology;  Laterality: Right;  CDE:13.25  . CATARACT EXTRACTION W/PHACO  05/15/2012   Procedure: CATARACT EXTRACTION PHACO AND INTRAOCULAR LENS PLACEMENT (IOC);  Surgeon: Tonny Branch, MD;  Location: [facility];  Service: Ophthalmology;  Laterality: Left;  CDE:  12.32  . COLONOSCOPY N/A 01/30/2016   Procedure: COLONOSCOPY;  Surgeon: Danie Binder, MD;  [facility];  Service: Endoscopy;  Laterality: N/A;  2:00 PM - moved to 1:00 - office notified pt  . ESOPHAGOGASTRODUODENOSCOPY  2008   Dr. Oneida Alar: normal esophagus, antral erythema, 1 cm clean based duodenal ulcera, negative H. plyori  . ESOPHAGOGASTRODUODENOSCOPY N/A 01/30/2016   Procedure: ESOPHAGOGASTRODUODENOSCOPY (EGD);  Surgeon: Danie Binder, MD;  [facility];  Service: Endoscopy;  Laterality: N/A;  . EXCISION MASS HEAD Left 03/10/2018   Procedure: EXCISION MASS HEAD;  Surgeon: Melissa Montane, MD;  Location: [facility];  Service: ENT;  Laterality: Left;  . EYE SURGERY    . HERNIA REPAIR  2001  . PACEMAKER IMPLANT N/A 02/07/2017   Procedure: PACEMAKER IMPLANT;  Surgeon: Constance Haw, MD;  Location: [facility];  Service: Cardiovascular;  Laterality: N/A;  . PAROTIDECTOMY Left 03/10/2018   LEFT SIDE  PAROTIDECTOMTY AND NECK DISECTION WITH RESECTION OF A FOREHEAD LESION  . PAROTIDECTOMY Left 03/10/2018   Procedure: LEFT SIDE PAROTIDECTOMTY AND NECK DISECTION WITH RESECTION OF A FOREHEAD LESION;  Surgeon: Melissa Montane, MD;  Location: [facility];  Service: ENT;  Laterality: Left;  . PORTACATH PLACEMENT  2008  . SKIN FULL THICKNESS GRAFT Left 03/10/2018     Procedure: SKIN GRAFT FULL THICKNESS;  Surgeon: Melissa Montane, MD;  Location: [facility];  Service: ENT;  Laterality: Left;  . TONSILLECTOMY     removed around 67-51 years of age    Social History:  reports that he quit smoking about 40 years ago. He quit smokeless tobacco use about 51 years ago. He reports that he does not drink alcohol or use drugs.  Allergies: Allergies  Allergen Reactions  . Aspirin Other (See Comments)    Bleeding ulcers.   . Ibuprofen Other (See Comments)    Bleeding ulcers.    Family History:  Family History  Problem Relation Age of Onset  . Diabetes Father   . Colon cancer Neg Hx     Prior to Admission medications   Medication Sig Start Date End Date Taking? Authorizing Provider  acetaminophen (TYLENOL) 500 MG tablet Take 500 mg by mouth every 6 (six) hours as needed for moderate pain or headache.   Yes [provider]  allopurinol (ZYLOPRIM) 100 MG tablet Take 1 tablet (100 mg total) by mouth daily. 11/01/17  Yes Derek Jack, MD  benzonatate (TESSALON) 100 MG capsule Take 100 mg by mouth 2 (two) times daily as needed for cough.   Yes [provider]  docusate sodium (COLACE) 100 MG capsule Take 100 mg by mouth daily as needed for mild constipation.    Yes [provider]  feeding supplement, ENSURE ENLIVE, (ENSURE ENLIVE) LIQD Take 237 mLs by mouth 2 (two) times daily between meals. Patient taking differently: Take 237 mLs by mouth daily.  10/08/17  Yes Dhungel, Nishant, MD  furosemide (LASIX) 20 MG tablet Take 20 mg by mouth daily. For 5 days   Yes [provider]  ibrutinib (IMBRUVICA) 140 MG capsul Take 2 capsules (280 mg total) by mouth daily. 04/23/18  Yes Lockamy, Randi L, NP-C  JANUVIA 25 MG tablet Take 25 mg by mouth daily. 02/28/18  Yes [provider]  levETIRAcetam (KEPPRA) 100 MG/ML solution Take 7.5 mLs (750 mg total) by mouth 2 (two) times daily. 02/08/17  Yes Nita Sells, MD   levofloxacin (LEVAQUIN) 500 MG tablet Take 500 mg by mouth daily.   Yes [provider]  predniSONE (DELTASONE) 10 MG tablet Take 10 mg by mouth daily with breakfast. Take 4 tablets for 1 day ,3 tabs for 1 day, 2 tabs for 1 day and 1 tab for 1 day.   Yes [provider]  rosuvastatin (CRESTOR) 10 MG tablet Take 10 mg by mouth at bedtime. 02/27/18  Yes [provider]  sodium bicarbonate 650 MG tablet Take 650 mg by mouth 2 (two) times daily.  04/24/16  Yes [provider]  vitamin C (ASCORBIC ACID) 500 MG tablet Take 500 mg by mouth daily.   Yes [provider]    Review of Systems:  Negative except as otherwise mentioned in HPI.   Physical Exam: Vitals:   06/06/2018 1543 05/14/2018 1600 05/14/2018 1700 06/02/2018 1736  BP:      Pulse:   91 84  Resp:   15 16  Temp:    97.6 F (36.4 C)  TempSrc:    Oral  SpO2: 91%  95% 96%  Weight:  47.9 kg    Height:  5\' 2"  (1.575 m)     Constitutional: Calm, warm to touch, experiencing intermittent episode of wet coughing spells; denying chest pain and vomiting.  Patient reports feeling nauseated and having decreased appetite.   Eyes: PERRL, lids and conjunctivae normal, no icterus ENMT: Mucous membranes is dry. Posterior pharynx clear of any exudate or lesions. Neck: No thyromegaly, erythema is appreciated on the left side from ongoing radiation therapy. Respiratory: Positive expiratory wheezing, diffuse rhonchi, no crackles.  Mild use of accessory muscles. Cardiovascular: Tachycardic, no rubs, no gallops, no murmurs. No extremity edema. Abdomen: no guarding, no masses palpated. No hepatosplenomegaly. Bowel sounds positive.  Musculoskeletal: no clubbing / cyanosis. No joint deformity upper and lower extremities. Good ROM, no contractures. Normal muscle tone.  Skin: Scalp with left frontotemporal wound, no signs of superimposed infection.  No petechiae, no bruises.  Erythematous changes appreciated on his neck  and mandible were patient is actively receiving radiation therapy. Neurologic: CN 2-12 grossly intact. Sensation intact, DTR normal. Strength 3-4/5 in all 4 limbs due to poor effort. Psychiatric: Normal judgment and insight. Alert and oriented x 3. Normal mood.    Labs on Admission: I have personally reviewed the following labs and imaging studies  CBC: Recent Labs  Lab 06/11/2018 1000  WBC 11.3*  NEUTROABS 0.7*  HGB 11.6*  HCT 36.1*  MCV 104.0*  PLT 90*   Basic Metabolic Panel: Recent Labs  Lab 05/30/2018 1000  NA 135  K 4.2  CL 102  CO2 22  GLUCOSE 143*  BUN 33*  CREATININE 1.97*  CALCIUM 8.6*   GFR: Estimated Creatinine Clearance: 20.3 mL/min (A) (by C-G formula based on SCr of 1.97 mg/dL (H)).   Liver Function Tests: Recent Labs  Lab 06/01/2018 1000  AST 184*  ALT 57*  ALKPHOS 107  BILITOT 1.7*  PROT 7.8  ALBUMIN 3.0*   Coagulation Profile: Recent Labs  Lab 06/04/2018 1025  INR 1.22   Urine analysis:    Component Value Date/Time   COLORURINE YELLOW 06/05/2018 1200   APPEARANCEUR CLEAR 06/01/2018 1200   LABSPEC 1.021 05/23/2018 1200   PHURINE 6.0 06/13/2018 1200   GLUCOSEU NEGATIVE 06/08/2018 1200   HGBUR NEGATIVE 06/02/2018 1200   BILIRUBINUR NEGATIVE 05/16/2018 1200   KETONESUR 5 (A) 06/02/2018 1200   PROTEINUR 100 (A) 05/28/2018 1200   UROBILINOGEN 0.2 02/11/2015 1718   NITRITE NEGATIVE 05/15/2018 1200   LEUKOCYTESUR NEGATIVE 06/02/2018 1200    Recent Results (from the past 240 hour(s))  Blood Culture (routine x 2)     Status: None (Preliminary result)   Collection Time: 05/20/2018  9:39 AM  Result Value Ref Range Status   Specimen Description   Final    BLOOD RIGHT WRIST BOTTLES DRAWN AEROBIC AND ANAEROBIC   Special Requests   Final    Blood Culture results may not be optimal due to an inadequate volume of blood received in culture bottles Performed at [facility], [address], [place], [place] [address]    Culture PENDING  Incomplete    Report Status PENDING  Incomplete  Culture, blood (Routine x 2)     Status: None (Preliminary result)   Collection Time: 06/08/2018 10:00 AM  Result Value Ref Range Status   Specimen Description   Final    BLOOD RIGHT ARM BOTTLES DRAWN AEROBIC AND ANAEROBIC   Special Requests   Final  Blood Culture results may not be optimal due to an inadequate volume of blood received in culture bottles Performed at [facility], [address], [place], [place] [address]    Culture PENDING  Incomplete   Report Status PENDING  Incomplete     Radiological Exams on Admission: Ct Head Wo Contrast  Result Date: 05/31/2018 CLINICAL DATA:  Leukemia, currently receiving treatment. Syncopal episode today. Altered level of consciousness. Cough for months. EXAM: CT HEAD WITHOUT CONTRAST TECHNIQUE: Contiguous axial images were obtained from the base of the skull through the vertex without intravenous contrast. COMPARISON:  Head CT dated 08/26/2016. FINDINGS: Brain: Ventricles are stable in size and configuration. There is no mass, hemorrhage, edema or other evidence of acute parenchymal abnormality. No extra-axial hemorrhage. Vascular: Chronic calcified atherosclerotic changes of the large vessels at the skull base. No unexpected hyperdense vessel. Skull: Scalp (soft tissue) defect overlying the LEFT frontotemporal bone, with lucency along the course of the underlying suture, compatible with postsurgical change per the emergency room physician. Skull appears otherwise unremarkable. Sinuses/Orbits: Mild mucosal thickening within the ethmoid air cells. Fluid within the bilateral mastoid air cells. Fluid also extends to the RIGHT middle ear cavity. Periorbital and retro-orbital soft tissues are unremarkable. Other: None. IMPRESSION: 1. No acute intracranial abnormality. No intracranial mass, hemorrhage or edema. 2. Scalp (soft tissue) defect overlying the LEFT frontotemporal bone, with lucency along the course of the underlying  suture, compatible with postsurgical change per the emergency room physician. 3. Fluid within the bilateral mastoid air cells and RIGHT middle ear cavity, of uncertain chronicity but favored to be chronic. Electronically Signed   By: Franki Cabot M.D.   On: 05/26/2018 12:01   Dg Chest Port 1 View  Result Date: 05/17/2018 CLINICAL DATA:  Leukemia, cough. EXAM: PORTABLE CHEST 1 VIEW COMPARISON:  Chest x-rays dated 05/06/2018 and 11/16/2017. PET-CT dated 05/05/2018. FINDINGS: New masslike opacity within the RIGHT perihilar lung. LEFT lung remains clear. Heart size and mediastinal contours are stable. LEFT chest wall pacemaker leads appear stable in position. IMPRESSION: New masslike opacity within the RIGHT perihilar lung. This could represent pneumonia, aspiration, or neoplasm. Patient has had a previous RIGHT perihilar pneumonia (chest CT dated 12/31/2017), therefore, favor recurrent pneumonia. Recommend CT chest with contrast for further evaluation. Electronically Signed   By: Franki Cabot M.D.   On: 05/24/2018 10:37    EKG: Independently reviewed.  Atrial flutter, heart rate in the 120s; right bundle branch block appreciated.  Nonspecific ST segment depressions in the inferior leads appreciated.  Except for increasing his heart rate no significant changes from prior tracing.  Assessment/Plan 1-sepsis due to pneumonia [facility]) -New right patchy infiltrates appreciated on x-ray -Patient immunosuppressed and a high risk for infections. -Started on broad-spectrum antibiotics -Follow-up blood culture, sputum culture, urine culture, Legionella and strep pneumo antigen in urine. -Given history of steroid use, will start patient on Solu-Cortef 3 times daily and follow cortisol level. -Patient will also start receiving DuoNeb, Pulmicort and the use of flutter valve. -As needed antipyretics, IV fluids and Mucinex. -Overall prognosis is guarded.  2-lactic acidosis -In the setting of sepsis -Fluid  resuscitation given in the ED and subsequently and has been maintained at 125 mL/h. -Follow lactic acid trend and volume status.  3-CLL (chronic lymphocytic leukemia) ([facility]) and squamous cell carcinoma -Actively receiving chemotherapy and radiation therapy as an outpatient -For now we will hold chemotherapeutic agents. -Continue aggressive fluid resuscitation -Follow-up as an outpatient with oncology service. -Based on his clinical response  and needs may consult oncology service while hospitalized.  4-Malnutrition of moderate degree (HCC) -Continue Ensure twice a day -Maintain adequate hydration.  5-Typical atrial flutter ([place]) -Patient with history of typical atrial flutter -Heart rate was elevated on presentation, but most likely attributed to sepsis component. -Continue fluid resuscitation and follow vital signs -Blood pressure is soft and at this moment not adequate to give any medication for rate control -Of note, as an outpatient he was not using any medication for that.  6-CKD (chronic kidney disease), stage III (HCC)  -Appears to be at baseline -Will provide fluid resuscitation -Minimize nephrotoxic agents and make adjustment based on his renal function to medication dosages -Follow creatinine trend.  7-history of seizure -Seizure precautions activated -Continue the use of Keppra  8-history of type 2 diabetes with nephropathy -Hold oral hypoglycemic agents -Will use a sliding scale insulin -Follow CBGs and adjust hypoglycemic regimen as needed.  DVT prophylaxis: heparin  Code Status: DNR/DNI Family Communication: no family at bedside  Disposition Plan: to be determine; prognosis is guarded. Hopefully stable to go back home with [facility] services at discharge. Consults called: none  Admission status: inpatient, stepdown, LOS > 2 midnights.    Time Spent: 70 minutes  Barton Dubois MD [facility] Pager [phone]   06/08/2018, 5:48 PM

## 2018-06-07 NOTE — ED Notes (Signed)
EDP notified of BP 

## 2018-06-07 NOTE — ED Triage Notes (Addendum)
Pt has leukemia and currently receiving tx. Family report walking into kitchen and had syncopal episode. EMS report pt awake when they arrived. Sats in 80's and O2 initiated. Up to 94 %. EMS reports pt will not leave o2 in place. Per family cough has been present for months

## 2018-06-07 NOTE — ED Notes (Signed)
Attempted to access port, unable to flush or draw back.  Deaccessed and peripheral IV being attempted.

## 2018-06-07 NOTE — ED Notes (Signed)
Dr. Jacobowitz at bedside. 

## 2018-06-07 NOTE — ED Notes (Signed)
2nd set of blood cultures obtained and sent to lab.

## 2018-06-08 LAB — COMPREHENSIVE METABOLIC PANEL
ALT: 86 U/L — ABNORMAL HIGH (ref 0–44)
AST: 167 U/L — ABNORMAL HIGH (ref 15–41)
Albumin: 2.1 g/dL — ABNORMAL LOW (ref 3.5–5.0)
Alkaline Phosphatase: 57 U/L (ref 38–126)
Anion gap: 11 (ref 5–15)
BILIRUBIN TOTAL: 1.1 mg/dL (ref 0.3–1.2)
BUN: 37 mg/dL — ABNORMAL HIGH (ref 8–23)
CO2: 19 mmol/L — ABNORMAL LOW (ref 22–32)
Calcium: 7.6 mg/dL — ABNORMAL LOW (ref 8.9–10.3)
Chloride: 109 mmol/L (ref 98–111)
Creatinine, Ser: 1.88 mg/dL — ABNORMAL HIGH (ref 0.61–1.24)
GFR calc Af Amer: 38 mL/min — ABNORMAL LOW (ref >60)
GFR, EST NON AFRICAN AMERICAN: 33 mL/min — AB (ref >60)
Glucose, Bld: 131 mg/dL — ABNORMAL HIGH (ref 70–99)
Potassium: 3.6 mmol/L (ref 3.5–5.1)
Sodium: 139 mmol/L (ref 135–145)
Total Protein: 5.8 g/dL — ABNORMAL LOW (ref 6.5–8.1)

## 2018-06-08 LAB — CBC
HCT: 31.9 % — ABNORMAL LOW (ref 39.0–52.0)
Hemoglobin: 10 g/dL — ABNORMAL LOW (ref 13.0–17.0)
MCH: 33.6 pg (ref 26.0–34.0)
MCHC: 31.3 g/dL (ref 30.0–36.0)
MCV: 107 fL — ABNORMAL HIGH (ref 80.0–100.0)
Platelets: 63 10*3/uL — ABNORMAL LOW (ref 150–400)
RBC: 2.98 MIL/uL — ABNORMAL LOW (ref 4.22–5.81)
RDW: 18.2 % — AB (ref 11.5–15.5)
WBC: 3.9 10*3/uL — AB (ref 4.0–10.5)
nRBC: 0 % (ref 0.0–0.2)

## 2018-06-08 LAB — LACTIC ACID, PLASMA
Lactic Acid, Venous: 3.2 mmol/L (ref 0.5–1.9)
Lactic Acid, Venous: 2.3 mmol/L (ref 0.5–1.9)
Lactic Acid, Venous: 2.6 mmol/L (ref 0.5–1.9)

## 2018-06-08 LAB — GLUCOSE, CAPILLARY
Glucose-Capillary: 97 mg/dL (ref 70–99)
Glucose-Capillary: 132 mg/dL — ABNORMAL HIGH (ref 70–99)
Glucose-Capillary: 104 mg/dL — ABNORMAL HIGH (ref 70–99)
Glucose-Capillary: 99 mg/dL (ref 70–99)

## 2018-06-08 LAB — STREP PNEUMONIAE URINARY ANTIGEN: Strep Pneumo Urinary Antigen: NEGATIVE

## 2018-06-08 LAB — CORTISOL: Cortisol, Plasma: 71.7 ug/dL

## 2018-06-08 MED ORDER — SODIUM CHLORIDE 0.9 % IV BOLUS: 250.0000 mL | Freq: Once | INTRAVENOUS | Status: DC

## 2018-06-08 MED ORDER — SACCHAROMYCES BOULARDII 250 MG PO CAPS
250.0000 mg | ORAL_CAPSULE | Freq: Two times a day (BID) | ORAL | Status: DC
  Administered 2018-06-08 – 2018-06-09 (×3): 250 mg via ORAL
  Filled 2018-06-08 (×5): qty 1

## 2018-06-08 NOTE — Progress Notes (Signed)
Dr. Myna Hidalgo notified of lactic acid 3.2 and temperature elevation.

## 2018-06-08 NOTE — Progress Notes (Signed)
PROGRESS NOTE    Randall Reyes  RAQ:[id number] DOB: 28-Feb-1936 DOA: 05/31/2018 PCP: [name], MD     Brief Narrative:  83 y.o. male with a past medical history significant for type 2 diabetes mellitus, chronic kidney disease stage III, chronic lymphocytic leukemia, melanoma/squamous cell carcinoma in situ and moderate protein calorie malnutrition; who presented to the hospital secondary to episode of syncope, fever and general malaise.  Patient also reports ongoing coughing spells, that has become productive of yellowish/greenish sputum.  He also expressed associated nausea and decrease in his appetite.  No chest pain, no vomiting, no dysuria, no hematuria, no melena, no hematochezia, no focal weakness, no headaches or any other complaints.  In the ED patient was found with a temperature of 105, tachycardic, respiratory rate in the 30s, soft blood pressure systolic in the low 456'Y, elevated lactic acid at 2.6 and a chest x-ray 8 with positive new right lung infiltrates indicating pneumonia.  Cultures were taken, broad-spectrum antibiotics initiated and fluid resuscitation started.  [facility] the patient for further evaluation and management.  Assessment & Plan: 1-sepsis due to pneumonia -Right lower lobe -Patient with history of chronic lymphocytic leukemia actively on chemotherapy, steroids and radiation -Continue broad-spectrum antibiotics -Continue as needed antipyretics -We will continue stress dose steroids, continue DuoNeb, Pulmicort and flutter valve -Continue IV fluids and Mucinex.  2-lactic acidosis -In the setting of sepsis -Level peaked at 3.2 -Now trending down -Continue IV fluids and further sepsis management as mentioned above.  3-chronic lymphocytic leukemia -Continue holding chemotherapy for now -Oncology has been consulted -Will follow recommendations.  4-moderate malnutrition -Continue Ensure twice a day  -continue adequate  hydration.  5-typical atrial flutter -Patient with history of flutter; elevated heart rate at presentation in the setting of sepsis. -Rate now controlled -Was not using any medications prior to admission to control his rate or as part of anticoagulation. -Continue to monitor on telemetry.  6-chronic kidney disease is stage III -Appears to be at baseline, to slightly better -Continue fluid resuscitation -Follow renal function trend. -Minimize nephrotoxic agents and further adjust medications as needed as for his GFR.  7-history of seizure -Continue seizure precautions -Continue Keppra  8-history of type 2 diabetes with nephropathy -Continue holding oral hypoglycemic agents while inpatient -Continue sliding scale insulin.  DVT prophylaxis: Heparin Code Status: DNR/DNI Family Communication: Wife at bedside Disposition Plan: Remains in stepdown, continue IV antibiotics; follow electrolytes, renal function and continue supportive care.  Consultants:   Oncology  Procedures:   Below for x-ray reports.  Antimicrobials:  Anti-infectives (From admission, onward)   Start     Dose/Rate Route Frequency Ordered Stop   06/08/18 0900  vancomycin (VANCOCIN) 500 mg in sodium chloride 0.9 % 100 mL IVPB     500 mg 100 mL/hr over 60 Minutes Intravenous Every 24 hours 06/08/2018 1039     05/30/2018 2200  ceFEPIme (MAXIPIME) 1 g in sodium chloride 0.9 % 100 mL IVPB     1 g 200 mL/hr over 30 Minutes Intravenous Every 12 hours 05/15/2018 1036     05/14/2018 0945  ceFEPIme (MAXIPIME) 2 g in sodium chloride 0.9 % 100 mL IVPB     2 g 200 mL/hr over 30 Minutes Intravenous  Once 06/09/2018 0940 05/18/2018 1100   05/28/2018 0945  metroNIDAZOLE (FLAGYL) IVPB 500 mg     500 mg 100 mL/hr over 60 Minutes Intravenous Every 8 hours 05/29/2018 0940     06/11/2018 0945  vancomycin (VANCOCIN) IVPB 1000 mg/200 mL premix     1,000 mg 200 mL/hr over 60 Minutes Intravenous  Once 05/15/2018 0940 06/11/2018 1138        Subjective: Spiking fever, having ongoing difficulty breathing and productive cough.  No chest pain, no nausea, no vomiting.  Objective: Vitals:   06/08/18 0736 06/08/18 1046 06/08/18 1100 06/08/18 1124  BP:   (!) 106/56   Pulse:   (!) 102   Resp:   19   Temp:    98 F (36.7 C)  TempSrc: Oral   Oral  SpO2:  92% 99%   Weight:      Height:        Intake/Output Summary (Last 24 hours) at 06/08/2018 1207 Last data filed at 06/08/2018 1137 Gross per 24 hour  Intake 4076.86 ml  Output 125 ml  Net 3951.86 ml   Filed Weights   06/03/2018 0934 06/03/2018 1600  Weight: 51.3 kg 47.9 kg    Examination: General exam: Alert, awake, oriented x 3; still having difficulty breathing and requiring oxygen supplementation.  Patient continued to spike fever and is having poor appetite; despite that family expressed that he looks better today. Respiratory system: Positive expiratory wheezing, diffuse rhonchi (right more than left), positive tachypnea.  No using accessory muscles.   Cardiovascular system:RRR. No murmurs, rubs, gallops. Gastrointestinal system: Abdomen is nondistended, soft and nontender. No organomegaly or masses felt. Normal bowel sounds heard. Central nervous system: Alert and oriented. No focal neurological deficits. Extremities: No C/C/E, +pedal pulses Skin: No rashes, lesions or ulcers Psychiatry: Judgement and insight appear normal. Mood & affect appropriate.     Data Reviewed: I have personally reviewed following labs and imaging studies  CBC: Recent Labs  Lab 06/05/2018 1000 06/08/18 0342  WBC 11.3* 3.9*  NEUTROABS 0.7*  --   HGB 11.6* 10.0*  HCT 36.1* 31.9*  MCV 104.0* 107.0*  PLT 90* 63*   Basic Metabolic Panel: Recent Labs  Lab 05/18/2018 1000 06/08/18 0342  NA 135 139  K 4.2 3.6  CL 102 109  CO2 22 19*  GLUCOSE 143* 131*  BUN 33* 37*  CREATININE 1.97* 1.88*  CALCIUM 8.6* 7.6*   GFR: Estimated Creatinine Clearance: 21.2 mL/min (A) (by C-G  formula based on SCr of 1.88 mg/dL (H)).   Liver Function Tests: Recent Labs  Lab 05/28/2018 1000 06/08/18 0342  AST 184* 167*  ALT 57* 86*  ALKPHOS 107 57  BILITOT 1.7* 1.1  PROT 7.8 5.8*  ALBUMIN 3.0* 2.1*   Coagulation Profile: Recent Labs  Lab 05/29/2018 1025  INR 1.22   CBG: Recent Labs  Lab 05/19/2018 2233 06/08/18 0743 06/08/18 1122  GLUCAP 124* 97 132*   Urine analysis:    Component Value Date/Time   COLORURINE YELLOW 06/02/2018 1200   APPEARANCEUR CLEAR 05/25/2018 1200   LABSPEC 1.021 05/22/2018 1200   PHURINE 6.0 05/22/2018 1200   GLUCOSEU NEGATIVE 05/23/2018 1200   HGBUR NEGATIVE 05/27/2018 1200   BILIRUBINUR NEGATIVE 06/03/2018 1200   KETONESUR 5 (A) 06/09/2018 1200   PROTEINUR 100 (A) 06/02/2018 1200   UROBILINOGEN 0.2 02/11/2015 1718   NITRITE NEGATIVE 05/23/2018 1200   LEUKOCYTESUR NEGATIVE 06/09/2018 1200    Recent Results (from the past 240 hour(s))  Blood Culture (routine x 2)     Status: None (Preliminary result)   Collection Time: 06/04/2018  9:39 AM  Result Value Ref Range Status   Specimen Description   Final    BLOOD RIGHT WRIST BOTTLES DRAWN AEROBIC  AND ANAEROBIC   Special Requests   Final    Blood Culture results may not be optimal due to an inadequate volume of blood received in culture bottles   Culture   Final    NO GROWTH < 24 HOURS Performed at [facility], [address], [place], [place] [address]    Report Status PENDING  Incomplete  Culture, blood (Routine x 2)     Status: None (Preliminary result)   Collection Time: 06/11/2018 10:00 AM  Result Value Ref Range Status   Specimen Description   Final    BLOOD RIGHT ARM BOTTLES DRAWN AEROBIC AND ANAEROBIC   Special Requests   Final    Blood Culture results may not be optimal due to an inadequate volume of blood received in culture bottles   Culture   Final    NO GROWTH < 24 HOURS Performed at [facility], [address] [place], [place] [address]    Report Status PENDING   Incomplete  MRSA PCR Screening     Status: Abnormal   Collection Time: 05/23/2018  4:00 PM  Result Value Ref Range Status   MRSA by PCR POSITIVE (A) NEGATIVE Final    Comment:        The GeneXpert MRSA Assay (FDA approved for NASAL specimens only), is one component of a comprehensive MRSA colonization surveillance program. It is not intended to diagnose MRSA infection nor to guide or monitor treatment for MRSA infections. RESULT CALLED TO, READ BACK BY AND VERIFIED WITH: SMITH,J @ 2232 ON 05/29/2018 BY JUW Performed at [facility], [address] [place], [place] [address]       Radiology Studies: Ct Head Wo Contrast  Result Date: 05/22/2018 CLINICAL DATA:  Leukemia, currently receiving treatment. Syncopal episode today. Altered level of consciousness. Cough for months. EXAM: CT HEAD WITHOUT CONTRAST TECHNIQUE: Contiguous axial images were obtained from the base of the skull through the vertex without intravenous contrast. COMPARISON:  Head CT dated 08/26/2016. FINDINGS: Brain: Ventricles are stable in size and configuration. There is no mass, hemorrhage, edema or other evidence of acute parenchymal abnormality. No extra-axial hemorrhage. Vascular: Chronic calcified atherosclerotic changes of the large vessels at the skull base. No unexpected hyperdense vessel. Skull: Scalp (soft tissue) defect overlying the LEFT frontotemporal bone, with lucency along the course of the underlying suture, compatible with postsurgical change per the emergency room physician. Skull appears otherwise unremarkable. Sinuses/Orbits: Mild mucosal thickening within the ethmoid air cells. Fluid within the bilateral mastoid air cells. Fluid also extends to the RIGHT middle ear cavity. Periorbital and retro-orbital soft tissues are unremarkable. Other: None. IMPRESSION: 1. No acute intracranial abnormality. No intracranial mass, hemorrhage or edema. 2. Scalp (soft tissue) defect overlying the LEFT frontotemporal bone, with  lucency along the course of the underlying suture, compatible with postsurgical change per the emergency room physician. 3. Fluid within the bilateral mastoid air cells and RIGHT middle ear cavity, of uncertain chronicity but favored to be chronic. Electronically Signed   By: Franki Cabot M.D.   On: 05/24/2018 12:01   Dg Chest Port 1 View  Result Date: 05/18/2018 CLINICAL DATA:  Leukemia, cough. EXAM: PORTABLE CHEST 1 VIEW COMPARISON:  Chest x-rays dated 05/06/2018 and 11/16/2017. PET-CT dated 05/05/2018. FINDINGS: New masslike opacity within the RIGHT perihilar lung. LEFT lung remains clear. Heart size and mediastinal contours are stable. LEFT chest wall pacemaker leads appear stable in position. IMPRESSION: New masslike opacity within the RIGHT perihilar lung. This could represent pneumonia, aspiration, or  neoplasm. Patient has had a previous RIGHT perihilar pneumonia (chest CT dated 12/31/2017), therefore, favor recurrent pneumonia. Recommend CT chest with contrast for further evaluation. Electronically Signed   By: Franki Cabot M.D.   On: 06/05/2018 10:37    Scheduled Meds: . acetaminophen  1,000 mg Oral Once  . budesonide (PULMICORT) nebulizer solution  0.5 mg Nebulization BID  . Chlorhexidine Gluconate Cloth  6 each Topical Q0600  . dextromethorphan-guaiFENesin  1 tablet Oral BID  . feeding supplement (ENSURE ENLIVE)  237 mL Oral BID BM  . heparin injection (subcutaneous)  5,000 Units Subcutaneous Q8H  . hydrocortisone sod succinate (SOLU-CORTEF) inj  100 mg Intravenous Q8H  . insulin aspart  0-5 Units Subcutaneous QHS  . insulin aspart  0-9 Units Subcutaneous TID WC  . ipratropium-albuterol  3 mL Nebulization Q6H  . levETIRAcetam  750 mg Oral BID  . mouth rinse  15 mL Mouth Rinse BID  . mupirocin ointment  1 application Nasal BID  . saccharomyces boulardii  250 mg Oral BID  . sodium bicarbonate  650 mg Oral BID  . sodium chloride flush  3 mL Intravenous Q12H   Continuous  Infusions: . sodium chloride Stopped (06/08/18 1045)  . ceFEPime (MAXIPIME) IV 1 g (06/08/18 1138)  . metronidazole 100 mL/hr at 06/08/18 1137  . sodium chloride 245.9 mL/hr at 06/08/18 0520  . vancomycin Stopped (06/08/18 1032)     LOS: 1 day    Time spent: 35 minutes. Greater than 50% of this time was spent in direct contact with the patient, coordinating care and discussing relevant ongoing clinical issues, including ongoing acute findings of right lower lobe pneumonia, elevated lactic acidosis, soft blood pressure meeting criteria for sepsis.  Slowly improving.  Wife at bedside who was updated and all questions answered.  Oncology will be consulted for now continue holding active chemotherapeutic treatment.    Barton Dubois, MD [facility] Pager [phone]   06/08/2018, 12:07 PM

## 2018-06-09 ENCOUNTER — Inpatient Hospital Stay (HOSPITAL_COMMUNITY): Payer: Medicare Other

## 2018-06-09 LAB — CBC
HCT: 29.3 % — ABNORMAL LOW (ref 39.0–52.0)
Hemoglobin: 9.2 g/dL — ABNORMAL LOW (ref 13.0–17.0)
MCH: 33.5 pg (ref 26.0–34.0)
MCHC: 31.4 g/dL (ref 30.0–36.0)
MCV: 106.5 fL — ABNORMAL HIGH (ref 80.0–100.0)
NRBC: 0 % (ref 0.0–0.2)
PLATELETS: 68 10*3/uL — AB (ref 150–400)
RBC: 2.75 MIL/uL — ABNORMAL LOW (ref 4.22–5.81)
RDW: 18.9 % — ABNORMAL HIGH (ref 11.5–15.5)
WBC: 5.1 10*3/uL (ref 4.0–10.5)

## 2018-06-09 LAB — LACTIC ACID, PLASMA: LACTIC ACID, VENOUS: 2.1 mmol/L — AB (ref 0.5–1.9)

## 2018-06-09 LAB — GLUCOSE, CAPILLARY
GLUCOSE-CAPILLARY: 210 mg/dL — AB (ref 70–99)
Glucose-Capillary: 87 mg/dL (ref 70–99)
Glucose-Capillary: 260 mg/dL — ABNORMAL HIGH (ref 70–99)
Glucose-Capillary: 154 mg/dL — ABNORMAL HIGH (ref 70–99)

## 2018-06-09 LAB — LEGIONELLA PNEUMOPHILA SEROGP 1 UR AG: L. pneumophila Serogp 1 Ur Ag: NEGATIVE

## 2018-06-09 LAB — BASIC METABOLIC PANEL
Anion gap: 6 (ref 5–15)
BUN: 46 mg/dL — ABNORMAL HIGH (ref 8–23)
CO2: 18 mmol/L — ABNORMAL LOW (ref 22–32)
Calcium: 7.5 mg/dL — ABNORMAL LOW (ref 8.9–10.3)
Chloride: 118 mmol/L — ABNORMAL HIGH (ref 98–111)
Creatinine, Ser: 1.94 mg/dL — ABNORMAL HIGH (ref 0.61–1.24)
GFR calc Af Amer: 37 mL/min — ABNORMAL LOW (ref >60)
GFR, EST NON AFRICAN AMERICAN: 32 mL/min — AB (ref >60)
Glucose, Bld: 193 mg/dL — ABNORMAL HIGH (ref 70–99)
Potassium: 3.3 mmol/L — ABNORMAL LOW (ref 3.5–5.1)
SODIUM: 142 mmol/L (ref 135–145)

## 2018-06-09 MED ORDER — LEVALBUTEROL HCL 0.63 MG/3ML IN NEBU
0.6300 mg | INHALATION_SOLUTION | RESPIRATORY_TRACT | Status: AC
  Administered 2018-06-09: 0.63 mg via RESPIRATORY_TRACT
  Filled 2018-06-09: qty 3

## 2018-06-09 MED ORDER — SODIUM CHLORIDE 0.9 % IV SOLN
1.0000 g | INTRAVENOUS | Status: DC
  Administered 2018-06-09: 1 g via INTRAVENOUS
  Filled 2018-06-09 (×2): qty 1

## 2018-06-09 MED ORDER — FUROSEMIDE 10 MG/ML IJ SOLN
20.0000 mg | Freq: Once | INTRAMUSCULAR | Status: AC
  Administered 2018-06-09: 20 mg via INTRAVENOUS
  Filled 2018-06-09: qty 2

## 2018-06-09 MED ORDER — LEVALBUTEROL HCL 0.63 MG/3ML IN NEBU: 0.6300 mg | INHALATION_SOLUTION | Freq: Four times a day (QID) | RESPIRATORY_TRACT | Status: DC | PRN

## 2018-06-09 MED ORDER — IPRATROPIUM BROMIDE 0.02 % IN SOLN
0.5000 mg | Freq: Four times a day (QID) | RESPIRATORY_TRACT | Status: DC
  Administered 2018-06-09 – 2018-06-10 (×4): 0.5 mg via RESPIRATORY_TRACT
  Filled 2018-06-09 (×4): qty 2.5

## 2018-06-09 MED ORDER — METOPROLOL TARTRATE 5 MG/5ML IV SOLN
2.5000 mg | Freq: Once | INTRAVENOUS | Status: AC
  Administered 2018-06-09: 2.5 mg via INTRAVENOUS
  Filled 2018-06-09: qty 5

## 2018-06-09 MED ORDER — POTASSIUM CHLORIDE CRYS ER 20 MEQ PO TBCR
40.0000 meq | EXTENDED_RELEASE_TABLET | Freq: Once | ORAL | Status: AC
  Administered 2018-06-09: 40 meq via ORAL
  Filled 2018-06-09: qty 2

## 2018-06-09 NOTE — Progress Notes (Signed)
PROGRESS NOTE    Randall Reyes  YQM:[id number] DOB: 1935-07-14 DOA: 06/04/2018 PCP: [name], MD    Brief Narrative:  83 y.o. male with a past medical history significant for type 2 diabetes mellitus, chronic kidney disease stage III, chronic lymphocytic leukemia, melanoma/squamous cell carcinoma in situ and moderate protein calorie malnutrition; who presented to the hospital secondary to episode of syncope, fever and general malaise.  Patient also reports ongoing coughing spells, that has become productive of yellowish/greenish sputum.  He also expressed associated nausea and decrease in his appetite.  No chest pain, no vomiting, no dysuria, no hematuria, no melena, no hematochezia, no focal weakness, no headaches or any other complaints.  In the ED patient was found with a temperature of 105, tachycardic, respiratory rate in the 30s, soft blood pressure systolic in the low 528'U, elevated lactic acid at 2.6 and a chest x-ray 8 with positive new right lung infiltrates indicating pneumonia.  Cultures were taken, broad-spectrum antibiotics initiated and fluid resuscitation started.  [facility] the patient for further evaluation and management.  Assessment & Plan: 1-sepsis due to pneumonia -Right lower lobe -Patient with history of chronic lymphocytic leukemia actively on chemotherapy, steroids and radiation -Continue broad-spectrum antibiotics -Continue as needed antipyretics -Will continue stress dose steroids, continue DuoNeb, Pulmicort and flutter valve -Continue IV fluids (rate adjusted with concerns of mild vascular congestion on exam) and Mucinex. -repeat CXR  2-lactic acidosis -In the setting of sepsis -Level peaked at 3.2 and now 2.1 -Continue IV fluids at lower rate, will follow trend  3-chronic lymphocytic leukemia -Continue holding chemotherapy for now -Oncology has been consulted -Will follow recommendations.  4-moderate  malnutrition -Continue Ensure twice a day  -continue adequate hydration. -appreciate dietitian rec's.  5-typical atrial flutter -Patient with history of flutter; elevated heart rate at presentation in the setting of sepsis and dehydration on admission. -Rate in the 130's today -will give low dose lopressor X 1 -Was not using any medications prior to admission to control his rate or as part of anticoagulation. -Continue to monitor on telemetry.  6-chronic kidney disease is stage III -Appears to be at baseline, to slightly better after IVF's -Follow renal function trend. -Minimize nephrotoxic agents and further adjust medications as needed as for his GFR.  7-history of seizure -Continue seizure precautions -Continue Keppra  8-history of type 2 diabetes with nephropathy -Continue holding oral hypoglycemic agents while inpatient -Continue sliding scale insulin.  9-dysphagia -will follow Speech therapy rec's -NPO for now.  DVT prophylaxis: Heparin Code Status: DNR/DNI Family Communication: Wife at bedside Disposition Plan: Remains in stepdown, continue IV antibiotics; follow electrolytes, renal function and continue supportive care.  Repeat chest x-ray 8, following speech therapy recommendation will place n.p.o.  Lopressor and low-dose Lasix x1 to be given.  Consultants:   Oncology  Procedures:   Below for x-ray reports.  Antimicrobials:  Anti-infectives (From admission, onward)   Start     Dose/Rate Route Frequency Ordered Stop   06/09/18 2200  ceFEPIme (MAXIPIME) 1 g in sodium chloride 0.9 % 100 mL IVPB     1 g 200 mL/hr over 30 Minutes Intravenous Every 24 hours 06/09/18 0751     06/08/18 0900  vancomycin (VANCOCIN) 500 mg in sodium chloride 0.9 % 100 mL IVPB     500 mg 100 mL/hr over 60 Minutes Intravenous Every 24 hours 05/17/2018 1039     05/14/2018 2200  ceFEPIme (MAXIPIME) 1 g in sodium chloride 0.9 %  100 mL IVPB  Status:  Discontinued     1 g 200 mL/hr over 30  Minutes Intravenous Every 12 hours 05/22/2018 1036 06/09/18 0751   05/24/2018 0945  ceFEPIme (MAXIPIME) 2 g in sodium chloride 0.9 % 100 mL IVPB     2 g 200 mL/hr over 30 Minutes Intravenous  Once 06/11/2018 0940 05/30/2018 1100   05/24/2018 0945  metroNIDAZOLE (FLAGYL) IVPB 500 mg     500 mg 100 mL/hr over 60 Minutes Intravenous Every 8 hours 05/17/2018 0940     05/21/2018 0945  vancomycin (VANCOCIN) IVPB 1000 mg/200 mL premix     1,000 mg 200 mL/hr over 60 Minutes Intravenous  Once 06/08/2018 0940 05/18/2018 1138       Subjective: Ongoing productive cough, short of breath and with difficulty speaking in full sentences.  Currently afebrile.  Requiring high flow nasal cannula oxygen supplementation.  Objective: Vitals:   06/09/18 1330 06/09/18 1355 06/09/18 1400 06/09/18 1500  BP: 110/67  (!) 102/56 109/84  Pulse: 64  (!) 31 (!) 137  Resp: 16  17 20   Temp:      TempSrc:      SpO2: 90% 93% 97% 91%  Weight:      Height:        Intake/Output Summary (Last 24 hours) at 06/09/2018 1527 Last data filed at 06/09/2018 1336 Gross per 24 hour  Intake 4201.9 ml  Output -  Net 4201.9 ml   Filed Weights   06/02/2018 0934 05/22/2018 1600 06/09/18 0400  Weight: 51.3 kg 47.9 kg 51.6 kg    Examination: General exam: Alert, awake, oriented x 3; chronically ill and frail on exam.  Denies chest pain.  Sprays still having significant difficulty breathing and ongoing productive cough.  No nausea, no vomiting.  Respiratory system: Diffuse rhonchi and expiratory wheezing; no frank crackles appreciated.  Decreased breath sounds at the bases.  No using accessory muscles.  Positive tachypnea with minimal exertion.   Cardiovascular system: Irregular, no rubs, no gallops, no JVD on exam..  Gastrointestinal system: Abdomen is nondistended, soft and nontender. No organomegaly or masses felt. Normal bowel sounds heard. Central nervous system: Alert and oriented. No focal neurological deficits. Extremities: No C/C/E, +pedal  pulses Skin: No rashes, lesions or ulcers Psychiatry: Judgement and insight appear normal. Mood & affect appropriate.    Data Reviewed: I have personally reviewed following labs and imaging studies  CBC: Recent Labs  Lab 06/03/2018 1000 06/08/18 0342 06/09/18 1052  WBC 11.3* 3.9* 5.1  NEUTROABS 0.7*  --   --   HGB 11.6* 10.0* 9.2*  HCT 36.1* 31.9* 29.3*  MCV 104.0* 107.0* 106.5*  PLT 90* 63* 68*   Basic Metabolic Panel: Recent Labs  Lab 06/06/2018 1000 06/08/18 0342 06/09/18 1052  NA 135 139 142  K 4.2 3.6 3.3*  CL 102 109 118*  CO2 22 19* 18*  GLUCOSE 143* 131* 193*  BUN 33* 37* 46*  CREATININE 1.97* 1.88* 1.94*  CALCIUM 8.6* 7.6* 7.5*   GFR: Estimated Creatinine Clearance: 22.2 mL/min (A) (by C-G formula based on SCr of 1.94 mg/dL (H)).   Liver Function Tests: Recent Labs  Lab 06/03/2018 1000 06/08/18 0342  AST 184* 167*  ALT 57* 86*  ALKPHOS 107 57  BILITOT 1.7* 1.1  PROT 7.8 5.8*  ALBUMIN 3.0* 2.1*   Coagulation Profile: Recent Labs  Lab 05/29/2018 1025  INR 1.22   CBG: Recent Labs  Lab 06/08/18 1122 06/08/18 1710 06/08/18 2104 06/09/18 0727 06/09/18 1138  GLUCAP 132* 104* 99 87 210*   Urine analysis:    Component Value Date/Time   COLORURINE YELLOW 05/28/2018 1200   APPEARANCEUR CLEAR 05/17/2018 1200   LABSPEC 1.021 05/27/2018 1200   PHURINE 6.0 05/18/2018 1200   GLUCOSEU NEGATIVE 05/23/2018 1200   HGBUR NEGATIVE 06/11/2018 1200   BILIRUBINUR NEGATIVE 06/06/2018 1200   KETONESUR 5 (A) 06/09/2018 1200   PROTEINUR 100 (A) 05/22/2018 1200   UROBILINOGEN 0.2 02/11/2015 1718   NITRITE NEGATIVE 06/10/2018 1200   LEUKOCYTESUR NEGATIVE 05/17/2018 1200    Recent Results (from the past 240 hour(s))  Blood Culture (routine x 2)     Status: None (Preliminary result)   Collection Time: 05/23/2018  9:39 AM  Result Value Ref Range Status   Specimen Description   Final    BLOOD RIGHT WRIST BOTTLES DRAWN AEROBIC AND ANAEROBIC   Special Requests    Final    Blood Culture results may not be optimal due to an inadequate volume of blood received in culture bottles   Culture   Final    NO GROWTH 2 DAYS Performed at [facility], [address], [place], [place] [address]    Report Status PENDING  Incomplete  Culture, blood (Routine x 2)     Status: None (Preliminary result)   Collection Time: 05/31/2018 10:00 AM  Result Value Ref Range Status   Specimen Description   Final    BLOOD RIGHT ARM BOTTLES DRAWN AEROBIC AND ANAEROBIC   Special Requests   Final    Blood Culture results may not be optimal due to an inadequate volume of blood received in culture bottles   Culture   Final    NO GROWTH 2 DAYS Performed at [facility], [address] [place], [place] [address]    Report Status PENDING  Incomplete  MRSA PCR Screening     Status: Abnormal   Collection Time: 05/17/2018  4:00 PM  Result Value Ref Range Status   MRSA by PCR POSITIVE (A) NEGATIVE Final    Comment:        The GeneXpert MRSA Assay (FDA approved for NASAL specimens only), is one component of a comprehensive MRSA colonization surveillance program. It is not intended to diagnose MRSA infection nor to guide or monitor treatment for MRSA infections. RESULT CALLED TO, READ BACK BY AND VERIFIED WITH: SMITH,J @ 2232 ON 06/06/2018 BY JUW Performed at [facility], [address] [place], [place] [address]       Radiology Studies: No results found.  Scheduled Meds: . acetaminophen  1,000 mg Oral Once  . budesonide (PULMICORT) nebulizer solution  0.5 mg Nebulization BID  . Chlorhexidine Gluconate Cloth  6 each Topical Q0600  . dextromethorphan-guaiFENesin  1 tablet Oral BID  . feeding supplement (ENSURE ENLIVE)  237 mL Oral BID BM  . heparin injection (subcutaneous)  5,000 Units Subcutaneous Q8H  . hydrocortisone sod succinate (SOLU-CORTEF) inj  100 mg Intravenous Q8H  . insulin aspart  0-5 Units Subcutaneous QHS  . insulin aspart  0-9 Units Subcutaneous TID WC  .  ipratropium-albuterol  3 mL Nebulization Q6H  . levETIRAcetam  750 mg Oral BID  . mouth rinse  15 mL Mouth Rinse BID  . mupirocin ointment  1 application Nasal BID  . saccharomyces boulardii  250 mg Oral BID  . sodium bicarbonate  650 mg Oral BID  . sodium chloride flush  3 mL Intravenous Q12H   Continuous Infusions: . sodium chloride 75 mL/hr at 06/09/18 1336  .  ceFEPime (MAXIPIME) IV    . metronidazole Stopped (06/09/18 4461)  . sodium chloride 245.9 mL/hr at 06/08/18 0520  . vancomycin Stopped (06/09/18 1125)     LOS: 2 days    Time spent: 35 minutes.  Greater than 50% of this time was spent in direct contact with the patient, coordinating care and discussing relevant ongoing clinical issues including ongoing difficulty breathing and diffuse rhonchi/wheezing on exam.  Wife at bedside has been updated and all questions answered.  Case discussed with nursing staff and speech therapy.  Right now we will keep n.p.o.; and chills IV fluids rate, repeat x-ray 8 and give a dose of Lasix and a dose of Lopressor.  Barton Dubois, MD [facility] Pager [phone]   06/09/2018, 3:27 PM

## 2018-06-09 NOTE — Evaluation (Signed)
Clinical/Bedside Swallow Evaluation Patient Details  Name: Randall Reyes MRN: [id number] Date of Birth: January 22, 1936  Today's Date: 06/09/2018 Time: SLP Start Time (ACUTE ONLY): 1601 SLP Stop Time (ACUTE ONLY): 1630 SLP Time Calculation (min) (ACUTE ONLY): 25 min  Past Medical History:  Past Medical History:  Diagnosis Date  . Anemia   . B12 deficiency 12/07/2014  . Chronic kidney disease    CKD III  . CLL (chronic lymphocytic leukemia) ([place]) 01/24/2011  . Diabetes mellitus   . DM (diabetes mellitus) ([place]) 01/24/2011  . Eczema 01/24/2011  . History of pneumonia 04/2014  . Hypertension   . Leukemia ([place]) 6.22.2012   PER PATIENT  . Melanoma in situ ([place]) 01/24/2011  . Mild obesity 01/24/2011  . Pneumonia    2018  . Port catheter in place 05/02/2012  . Presence of permanent cardiac pacemaker    [employer]  . Shingles   . 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 Ulcer    Past Surgical History:  Past Surgical History:  Procedure Laterality Date  . bleeding ulcer    . CATARACT EXTRACTION W/PHACO  05/05/2012   Procedure: CATARACT EXTRACTION PHACO AND INTRAOCULAR LENS PLACEMENT (IOC);  Surgeon: Tonny Branch, MD;  Location: [facility];  Service: Ophthalmology;  Laterality: Right;  CDE:13.25  . CATARACT EXTRACTION W/PHACO  05/15/2012   Procedure: CATARACT EXTRACTION PHACO AND INTRAOCULAR LENS PLACEMENT (IOC);  Surgeon: Tonny Branch, MD;  Location: [facility];  Service: Ophthalmology;  Laterality: Left;  CDE:  12.32  . COLONOSCOPY N/A 01/30/2016   Procedure: COLONOSCOPY;  Surgeon: Danie Binder, MD;  [facility];  Service: Endoscopy;  Laterality: N/A;  2:00 PM - moved to 1:00 - office notified pt  . ESOPHAGOGASTRODUODENOSCOPY  2008   Dr. Oneida Alar: normal esophagus, antral erythema, 1 cm clean based duodenal ulcera, negative H. plyori  . ESOPHAGOGASTRODUODENOSCOPY N/A 01/30/2016   Procedure: ESOPHAGOGASTRODUODENOSCOPY (EGD);  Surgeon: Danie Binder, MD;   [facility];  Service: Endoscopy;  Laterality: N/A;  . EXCISION MASS HEAD Left 03/10/2018   Procedure: EXCISION MASS HEAD;  Surgeon: Melissa Montane, MD;  Location: [facility];  Service: ENT;  Laterality: Left;  . EYE SURGERY    . HERNIA REPAIR  2001  . PACEMAKER IMPLANT N/A 02/07/2017   Procedure: PACEMAKER IMPLANT;  Surgeon: Constance Haw, MD;  Location: [facility];  Service: Cardiovascular;  Laterality: N/A;  . PAROTIDECTOMY Left 03/10/2018   LEFT SIDE PAROTIDECTOMTY AND NECK DISECTION WITH RESECTION OF A FOREHEAD LESION  . PAROTIDECTOMY Left 03/10/2018   Procedure: LEFT SIDE PAROTIDECTOMTY AND NECK DISECTION WITH RESECTION OF A FOREHEAD LESION;  Surgeon: Melissa Montane, MD;  Location: [facility];  Service: ENT;  Laterality: Left;  . PORTACATH PLACEMENT  2008  . SKIN FULL THICKNESS GRAFT Left 03/10/2018   Procedure: SKIN GRAFT FULL THICKNESS;  Surgeon: Melissa Montane, MD;  Location: [facility];  Service: ENT;  Laterality: Left;  . TONSILLECTOMY     removed around 83-64 years of age   HPI:  83 y.o.malewith a past medical history significant for type 2 diabetes mellitus, chronic kidney disease stage III, chronic lymphocytic leukemia, melanoma/squamous cell carcinoma in situandmoderate protein calorie malnutrition; who presented to the hospital secondary to episode of syncope, fever and general malaise. Patient also reports ongoing coughing spells, that has become productive of yellowish/greenish sputum. He also expressed associated nausea and decrease in his appetite.No chest pain, no vomiting, no dysuria, no  hematuria, no melena, no hematochezia, no focal weakness, no headaches or any other complaints. In the ED patient was found with a temperature of 105, tachycardic, respiratory rate in the 30s, soft blood pressure systolic in the low 419'F, elevated lactic acid at 2.6 and a chest x-ray 8 with positive new right lung infiltrates indicating pneumonia. Cultures were taken, broad-spectrum  antibiotics initiated and fluid resuscitation started. [facility] the patient for further evaluation and management. BSE requested.   Dr. Janace Hoard surgery 02/2018: resection of forehead carcinomawith Acell  split thickness skin graft, left parotidectomy with excision of skin, left modified radical neck dissection, cervical facial advancement flap   Assessment / Plan / Recommendation Clinical Impression  Clinical swallow evaluation completed at bedside with wife present in room. Pt with audible wheezing and congestion prior to po administration and appears short of breath. Pt with reduced jaw ROM with questionable trismus from radiation vs recent radical neck dissection; CN VII involvement. Pt also with reduced lingual strength and ROM. Pt presented with limited ice chips, thin water via cup and straw, and small bites puree which yielded prolonged oral transit, suspected delay in swallow initiation, and increased shortness of breath/wet vocal quality post trials. Pt with audible secretions prior to PO administration and could possibly be due to fluid, however Lasix was given earlier today per RN. Pt appears at high risk for aspiration given decreased respiratory status, current PNA, suspected pharyngeal weakness and delay in initiation, and neck dissection with radiation treatment. Recommend NPO except for ice chips and small sips of water after oral care and po medications crushed as able in puree. Above to RN and SLP provided education to Pt and wife. SLP will follow for MBSS tomorrow.   SLP Visit Diagnosis: Dysphagia, unspecified (R13.10)    Aspiration Risk  Moderate aspiration risk    Diet Recommendation NPO except meds;Free water protocol after oral care   Medication Administration: Crushed with puree Postural Changes: Seated upright at 90 degrees;Remain upright for at least 30 minutes after po intake    Other  Recommendations Oral Care Recommendations: Oral care prior  to ice chip/H20;Oral care QID;Staff/trained caregiver to provide oral care   Follow up Recommendations Skilled Nursing facility      Frequency and Duration min 2x/week  1 week       Prognosis Prognosis for Safe Diet Advancement: Guarded Barriers to Reach Goals: Severity of deficits      Swallow Study   General Date of Onset: 05/26/2018 HPI: 83 y.o.malewith a past medical history significant for type 2 diabetes mellitus, chronic kidney disease stage III, chronic lymphocytic leukemia, melanoma/squamous cell carcinoma in situandmoderate protein calorie malnutrition; who presented to the hospital secondary to episode of syncope, fever and general malaise. Patient also reports ongoing coughing spells, that has become productive of yellowish/greenish sputum. He also expressed associated nausea and decrease in his appetite.No chest pain, no vomiting, no dysuria, no hematuria, no melena, no hematochezia, no focal weakness, no headaches or any other complaints. In the ED patient was found with a temperature of 105, tachycardic, respiratory rate in the 30s, soft blood pressure systolic in the low 790'W, elevated lactic acid at 2.6 and a chest x-ray 8 with positive new right lung infiltrates indicating pneumonia. Cultures were taken, broad-spectrum antibiotics initiated and fluid resuscitation started. [facility] to admit the patient for further evaluation and management. BSE requested. Type of Study: Bedside Swallow Evaluation Previous Swallow Assessment: None on record Diet  Prior to this Study: (full liquids) Temperature Spikes Noted: No Respiratory Status: Nasal cannula History of Recent Intubation: No Behavior/Cognition: Alert;Cooperative Oral Cavity Assessment: Dry Oral Care Completed by SLP: Yes Oral Cavity - Dentition: Edentulous Vision: Functional for self-feeding Self-Feeding Abilities: Needs assist Patient Positioning: Upright in bed Baseline Vocal Quality: Low  vocal intensity Volitional Cough: Weak;Congested Volitional Swallow: Able to elicit    Oral/Motor/Sensory Function Overall Oral Motor/Sensory Function: Moderate impairment Facial ROM: Reduced left;Suspected CN VII (facial) dysfunction Facial Symmetry: Abnormal symmetry left Facial Strength: Reduced left;Suspected CN VII (facial) dysfunction Lingual ROM: Reduced right;Reduced left Lingual Symmetry: Within Functional Limits Lingual Strength: Reduced Velum: (could not visualize) Mandible: Impaired(? trismus)   Ice Chips Ice chips: Impaired Presentation: Spoon Oral Phase Impairments: Reduced lingual movement/coordination Oral Phase Functional Implications: Oral holding Pharyngeal Phase Impairments: Suspected delayed Swallow;Decreased hyoid-laryngeal movement   Thin Liquid Thin Liquid: Impaired Presentation: Cup;Straw Pharyngeal  Phase Impairments: Suspected delayed Swallow;Decreased hyoid-laryngeal movement(no overt coughing, but increased WOB)    Nectar Thick Nectar Thick Liquid: Not tested   Honey Thick Honey Thick Liquid: Not tested   Puree Puree: Impaired Presentation: Spoon Oral Phase Impairments: Reduced lingual movement/coordination Oral Phase Functional Implications: Prolonged oral transit Pharyngeal Phase Impairments: Decreased hyoid-laryngeal movement   Solid     Solid: Not tested     Thank you,  Genene Churn, [place]  Francies Inch 06/09/2018,4:40 PM

## 2018-06-09 NOTE — Progress Notes (Signed)
CRITICAL VALUE ALERT  Critical Value: Lactic Acid 2.1  Date & Time Notied:  06/09/2018 @1125   Provider Notified: Dr. Dyann Kief  Orders Received/Actions taken: No new orders currently

## 2018-06-10 ENCOUNTER — Encounter (HOSPITAL_COMMUNITY): Payer: Self-pay | Admitting: Primary Care

## 2018-06-10 DIAGNOSIS — R5081 Fever presenting with conditions classified elsewhere: Secondary | ICD-10-CM

## 2018-06-10 DIAGNOSIS — C9110 Chronic lymphocytic leukemia of B-cell type not having achieved remission: Secondary | ICD-10-CM

## 2018-06-10 DIAGNOSIS — A419 Sepsis, unspecified organism: Principal | ICD-10-CM

## 2018-06-10 DIAGNOSIS — J189 Pneumonia, unspecified organism: Secondary | ICD-10-CM

## 2018-06-10 DIAGNOSIS — Z7189 Other specified counseling: Secondary | ICD-10-CM

## 2018-06-10 DIAGNOSIS — C4492 Squamous cell carcinoma of skin, unspecified: Secondary | ICD-10-CM

## 2018-06-10 DIAGNOSIS — Z515 Encounter for palliative care: Secondary | ICD-10-CM

## 2018-06-10 LAB — GLUCOSE, CAPILLARY
GLUCOSE-CAPILLARY: 143 mg/dL — AB (ref 70–99)
Glucose-Capillary: 138 mg/dL — ABNORMAL HIGH (ref 70–99)
Glucose-Capillary: 135 mg/dL — ABNORMAL HIGH (ref 70–99)
Glucose-Capillary: 135 mg/dL — ABNORMAL HIGH (ref 70–99)

## 2018-06-10 LAB — HIV ANTIBODY (ROUTINE TESTING W REFLEX): HIV SCREEN 4TH GENERATION: NONREACTIVE

## 2018-06-10 MED ORDER — POLYVINYL ALCOHOL 1.4 % OP SOLN: 1.0000 [drp] | Freq: Four times a day (QID) | OPHTHALMIC | Status: DC | PRN

## 2018-06-10 MED ORDER — LEVETIRACETAM IN NACL 500 MG/100ML IV SOLN
500.0000 mg | Freq: Two times a day (BID) | INTRAVENOUS | Status: DC
  Administered 2018-06-10: 500 mg via INTRAVENOUS
  Filled 2018-06-10: qty 100

## 2018-06-10 MED ORDER — ATROPINE SULFATE 1 % OP SOLN: 2.0000 [drp] | Freq: Four times a day (QID) | OPHTHALMIC | Status: DC | PRN

## 2018-06-10 MED ORDER — GLYCOPYRROLATE 0.2 MG/ML IJ SOLN: 0.2000 mg | INTRAMUSCULAR | Status: DC | PRN

## 2018-06-10 MED ORDER — LORAZEPAM 2 MG/ML IJ SOLN
0.5000 mg | INTRAMUSCULAR | Status: DC | PRN
  Administered 2018-06-11: 1 mg via INTRAVENOUS
  Filled 2018-06-10: qty 1

## 2018-06-10 MED ORDER — LEVALBUTEROL HCL 0.63 MG/3ML IN NEBU: 0.6300 mg | INHALATION_SOLUTION | RESPIRATORY_TRACT | Status: DC | PRN

## 2018-06-10 MED ORDER — ONDANSETRON 4 MG PO TBDP: 4.0000 mg | ORAL_TABLET | Freq: Four times a day (QID) | ORAL | Status: DC | PRN

## 2018-06-10 MED ORDER — METHYLPREDNISOLONE SODIUM SUCC 125 MG IJ SOLR
60.0000 mg | Freq: Three times a day (TID) | INTRAMUSCULAR | Status: DC
  Administered 2018-06-10: 60 mg via INTRAVENOUS
  Filled 2018-06-10: qty 2

## 2018-06-10 MED ORDER — MORPHINE SULFATE (PF) 2 MG/ML IV SOLN
2.0000 mg | Freq: Once | INTRAVENOUS | Status: AC
  Administered 2018-06-10: 2 mg via INTRAVENOUS
  Filled 2018-06-10: qty 1

## 2018-06-10 MED ORDER — POLYVINYL ALCOHOL 1.4 % OP SOLN: 1.0000 [drp] | OPHTHALMIC | Status: DC | PRN

## 2018-06-10 MED ORDER — MORPHINE SULFATE (PF) 2 MG/ML IV SOLN: 0.5000 mg | Freq: Three times a day (TID) | INTRAVENOUS | Status: DC | PRN

## 2018-06-10 MED ORDER — SODIUM CHLORIDE 0.9 % IV SOLN
3.0000 g | Freq: Two times a day (BID) | INTRAVENOUS | Status: DC
  Administered 2018-06-10: 3 g via INTRAVENOUS
  Filled 2018-06-10 (×3): qty 3

## 2018-06-10 MED ORDER — MORPHINE 100MG IN NS 100ML (1MG/ML) PREMIX INFUSION
2.0000 mg/h | INTRAVENOUS | Status: DC
  Administered 2018-06-10: 2 mg/h via INTRAVENOUS
  Administered 2018-06-11: 3 mg/h via INTRAVENOUS
  Filled 2018-06-10 (×2): qty 100

## 2018-06-10 MED ORDER — ALBUTEROL SULFATE (2.5 MG/3ML) 0.083% IN NEBU
INHALATION_SOLUTION | RESPIRATORY_TRACT | Status: AC
  Administered 2018-06-10: 2.5 mg
  Filled 2018-06-10: qty 3

## 2018-06-10 MED ORDER — GLYCOPYRROLATE 1 MG PO TABS: 1.0000 mg | ORAL_TABLET | ORAL | Status: DC | PRN

## 2018-06-10 MED ORDER — BIOTENE DRY MOUTH MT LIQD: 15.0000 mL | OROMUCOSAL | Status: DC | PRN

## 2018-06-10 MED ORDER — ONDANSETRON HCL 4 MG/2ML IJ SOLN: 4.0000 mg | Freq: Four times a day (QID) | INTRAMUSCULAR | Status: DC | PRN

## 2018-06-10 NOTE — Consult Note (Signed)
Consultation Note Date: 06/10/2018   Patient Name: Randall Reyes  DOB: [birth year]  MRN: [id number]  Age / Sex: 83 y.o., male  PCP: [name], MD Referring Physician: Barton Dubois, MD  Reason for Consultation: Establishing goals of care and Psychosocial/spiritual support  HPI/Patient Profile: 83 y.o. male  with past medical history of CLL first diagnosed 2006, multiple failed chemo treatments, squamous cell carcinoma surgery to left forehead and left parotid gland October 2019, subsequent radiation treatments, permanent pacemaker, diabetes, stage III kidney disease, anemia admitted on 06/06/2018 with sepsis due to pneumonia.  Palliative medicine consulted for goals of care at end-of-life.   Clinical Assessment and Goals of Care: Randall Reyes is resting quietly in bed, he has nonrebreather in place, looks acutely ill, actively dying.  He is surrounded by his wife of 82 years, Deneise Lever, his son and daughter-in-law.  We did speak in detail about his current health concerns, his frailty, that he appears to be actively dying.  Wife and he endorses that she also believes that he is dying.  We talked about his struggles with cancer treatment over the last few years.  And he states that this last radiation treatment was very difficult for him.  We talked about what we can and cannot change.  Family states that they feel Randall Reyes has found peace about his situation.  Wife in particular states that she does not want to prolong his dying, his suffering.  Prognosis discussed with permission, family encouraged to stay at bedside.  Family elects full comfort care, no further IV fluids or antibiotics, continuous morphine infusion, reducing oxygen.  HCPOA   NEXT OF KIN -wife and he is primary decision maker, but she leans on son and daughter-in-law for support.    SUMMARY OF RECOMMENDATIONS   Full comfort  care Morphine continuous infusion Anticipate hospital death in 24 hours or less  Code Status/Advance Care Planning:  DNR  Symptom Management:   Morphine continuous infusion  Ativan added for comfort  End-of-life order set implemented  Palliative Prophylaxis:   Aspiration, Oral Care and Turn Reposition  Additional Recommendations (Limitations, Scope, Preferences):  Full Comfort Care  Psycho-social/Spiritual:   Desire for further Chaplaincy support:no  Additional Recommendations: Caregiving  Support/Resources and Grief/Bereavement Support  Prognosis:   Hours - Days  Discharge Planning: Anticipated Hospital Death      Primary Diagnoses: Present on Admission: . Sepsis due to pneumonia ([place]) . Typical atrial flutter ([place]) . Malnutrition of moderate degree ([place]) . CLL (chronic lymphocytic leukemia) ([place]) . CKD (chronic kidney disease), stage III ([place])   I have reviewed the medical record, interviewed the patient and family, and examined the patient. The following aspects are pertinent.  Past Medical History:  Diagnosis Date  . Anemia   . B12 deficiency 12/07/2014  . Chronic kidney disease    CKD III  . CLL (chronic lymphocytic leukemia) ([place]) 01/24/2011  . Diabetes mellitus   . DM (diabetes mellitus) ([place]) 01/24/2011  . Eczema 01/24/2011  . History of pneumonia 04/2014  .  Hypertension   . Leukemia ([place]) 6.22.2012   PER PATIENT  . Melanoma in situ ([place]) 01/24/2011  . Mild obesity 01/24/2011  . Pneumonia    2018  . Port catheter in place 05/02/2012  . Presence of permanent cardiac pacemaker    [employer]  . Shingles   . 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 Ulcer    Social History   Socioeconomic History  . Marital status: Married    Spouse name: Not on file  . Number of children: Not on file  . Years of education: Not on file  . Highest education level: Not on file  Occupational History  . Not on file  Social  Needs  . Financial resource strain: Not on file  . Food insecurity:    Worry: Not on file    Inability: Not on file  . Transportation needs:    Medical: Not on file    Non-medical: Not on file  Tobacco Use  . Smoking status: Former Smoker    Last attempt to quit: 09/11/1977    Years since quitting: 40.7  . Smokeless tobacco: Former Systems developer    Quit date: 04/25/1967  Substance and Sexual Activity  . Alcohol use: No  . Drug use: No  . Sexual activity: Not on file  Lifestyle  . Physical activity:    Days per week: Not on file    Minutes per session: Not on file  . Stress: Not on file  Relationships  . Social connections:    Talks on phone: Not on file    Gets together: Not on file    Attends religious service: Not on file    Active member of club or organization: Not on file    Attends meetings of clubs or organizations: Not on file    Relationship status: Not on file  Other Topics Concern  . Not on file  Social History Narrative  . Not on file   Family History  Problem Relation Age of Onset  . Diabetes Father   . Colon cancer Neg Hx    Scheduled Meds: . acetaminophen  1,000 mg Oral Once  . budesonide (PULMICORT) nebulizer solution  0.5 mg Nebulization BID  . Chlorhexidine Gluconate Cloth  6 each Topical Q0600  . dextromethorphan-guaiFENesin  1 tablet Oral BID  . heparin injection (subcutaneous)  5,000 Units Subcutaneous Q8H  . insulin aspart  0-5 Units Subcutaneous QHS  . insulin aspart  0-9 Units Subcutaneous TID WC  . ipratropium  0.5 mg Nebulization Q6H  . mouth rinse  15 mL Mouth Rinse BID  . methylPREDNISolone (SOLU-MEDROL) injection  60 mg Intravenous Q8H  . mupirocin ointment  1 application Nasal BID  . sodium chloride flush  3 mL Intravenous Q12H   Continuous Infusions: . ampicillin-sulbactam (UNASYN) IV 3 g (06/10/18 1358)  . levETIRAcetam 500 mg (06/10/18 1229)   PRN Meds:.acetaminophen, levalbuterol, morphine injection Medications Prior to Admission:   Prior to Admission medications   Medication Sig Start Date End Date Taking? Authorizing Provider  acetaminophen (TYLENOL) 500 MG tablet Take 500 mg by mouth every 6 (six) hours as needed for moderate pain or headache.   Yes [provider]  allopurinol (ZYLOPRIM) 100 MG tablet Take 1 tablet (100 mg total) by mouth daily. 11/01/17  Yes Derek Jack, MD  benzonatate (TESSALON) 100 MG capsule Take 100 mg by mouth 2 (two) times daily as needed for cough.   Yes [provider]  docusate sodium (COLACE) 100 MG capsule Take 100 mg by mouth daily as needed for mild constipation.    Yes [provider]  feeding supplement, ENSURE ENLIVE, (ENSURE ENLIVE) LIQD Take 237 mLs by mouth 2 (two) times daily between meals. Patient taking differently: Take 237 mLs by mouth daily.  10/08/17  Yes Dhungel, Nishant, MD  furosemide (LASIX) 20 MG tablet Take 20 mg by mouth daily. For 5 days   Yes [provider]  ibrutinib (IMBRUVICA) 140 MG capsul Take 2 capsules (280 mg total) by mouth daily. 04/23/18  Yes Lockamy, Randi L, NP-C  JANUVIA 25 MG tablet Take 25 mg by mouth daily. 02/28/18  Yes [provider]  levETIRAcetam (KEPPRA) 100 MG/ML solution Take 7.5 mLs (750 mg total) by mouth 2 (two) times daily. 02/08/17  Yes Nita Sells, MD  levofloxacin (LEVAQUIN) 500 MG tablet Take 500 mg by mouth daily.   Yes [provider]  predniSONE (DELTASONE) 10 MG tablet Take 10 mg by mouth daily with breakfast. Take 4 tablets for 1 day ,3 tabs for 1 day, 2 tabs for 1 day and 1 tab for 1 day.   Yes [provider]  rosuvastatin (CRESTOR) 10 MG tablet Take 10 mg by mouth at bedtime. 02/27/18  Yes [provider]  sodium bicarbonate 650 MG tablet Take 650 mg by mouth 2 (two) times daily.  04/24/16  Yes [provider]  vitamin C (ASCORBIC ACID) 500 MG tablet Take 500 mg by mouth daily.   Yes [provider]   Allergies  Allergen  Reactions  . Aspirin Other (See Comments)    Bleeding ulcers.   . Ibuprofen Other (See Comments)    Bleeding ulcers.   Review of Systems  Unable to perform ROS: Acuity of condition    Physical Exam Vitals signs and nursing note reviewed.  Constitutional:      Appearance: He is ill-appearing.     Comments: Appears acutely/chronically ill, actively dying  HENT:     Head:     Comments: Bandage to left upper forehead Cardiovascular:     Rate and Rhythm: Normal rate.  Pulmonary:     Effort: Respiratory distress present.  Abdominal:     General: Abdomen is flat.     Tenderness: There is no guarding.  Skin:    General: Skin is warm and dry.     Comments: Multiple areas of bruising  Neurological:     Comments: Does not respond verbally     Vital Signs: BP 93/64   Pulse (!) 105   Temp (!) 97.5 F (36.4 C) (Axillary)   Resp 18   Ht 5\' 2"  (1.575 m)   Wt 51.6 kg   SpO2 100%   BMI 20.81 kg/m  Pain Scale: 0-10 POSS *See Group Information*: 1-Acceptable,Awake and alert Pain Score: 0-No pain   SpO2: SpO2: 100 % O2 Device:SpO2: 100 % O2 Flow Rate: .O2 Flow Rate (L/min): 11 L/min  IO: Intake/output summary:   Intake/Output Summary (Last 24 hours) at 06/10/2018 1423 Last data filed at 06/10/2018 3016 Gross per 24 hour  Intake 1390.4 ml  Output 200 ml  Net 1190.4 ml    LBM: Last BM Date: 06/08/18 Baseline Weight: Weight: 51.3 kg Most recent weight: Weight: 51.6 kg     Palliative Assessment/Data:   Flowsheet Rows     Most Recent Value  Intake Tab  Referral Department  Hospitalist  Unit at Time of Referral  Intermediate Care Unit  Palliative Care Primary Diagnosis  Pulmonary  Date Notified  06/10/18  Palliative Care Type  New Palliative care  Reason for referral  Clarify Goals of Care  Date of Admission  05/24/2018  Date first seen by Palliative Care  06/10/18  # of days Palliative referral response time  0 Day(s)  # of days IP prior to Palliative referral  3   Clinical Assessment  Pain Max last 24 hours  Not able to report  Pain Min Last 24 hours  Not able to report  Dyspnea Max Last 24 Hours  Not able to report  Dyspnea Min Last 24 hours  Not able to report  Psychosocial & Spiritual Assessment  Palliative Care Outcomes  Patient/Family wishes: Interventions discontinued/not started   Mechanical Ventilation      Time In: 1400 Time Out: 1515 Time Total: 75 minutes Greater than 50%  of this time was spent counseling and coordinating care related to the above assessment and plan.  Signed by: Drue Novel, NP   Please contact Palliative Medicine Team phone at [phone] for questions and concerns.  For individual provider: See Shea Evans

## 2018-06-10 NOTE — Progress Notes (Signed)
SLP Cancellation Note  Patient Details Name: Randall Reyes MRN: [id number] DOB: Apr 22, 1936   Cancelled treatment:       Reason Eval/Treat Not Completed: Patient not medically ready; Pt not appropriate for MBSS today due to increased oxygen demands and compromised respiratory status. Pt now on NRBM and less interactive today. Will defer intervention today and encouraged RN to focus on oral care; inappropriate for po medications at this time per RN. SLP will follow tomorrow.  Thank you,  Genene Churn, [place]    [place] 06/10/2018, 1:46 PM

## 2018-06-10 NOTE — Plan of Care (Signed)
Patient is now comfort care. Morphine drip going at 3mg /hr. Family members present at bedside. Patient is resting comfortably with no acute distress at this time

## 2018-06-10 NOTE — Progress Notes (Addendum)
PROGRESS NOTE    Randall Reyes  PJK:[id number] DOB: [birth year] DOA: 06/09/2018 PCP: [name], MD    Brief Narrative:  83 y.o. male with a past medical history significant for type 2 diabetes mellitus, chronic kidney disease stage III, chronic lymphocytic leukemia, melanoma/squamous cell carcinoma in situ and moderate protein calorie malnutrition; who presented to the hospital secondary to episode of syncope, fever and general malaise.  Patient also reports ongoing coughing spells, that has become productive of yellowish/greenish sputum.  He also expressed associated nausea and decrease in his appetite.  No chest pain, no vomiting, no dysuria, no hematuria, no melena, no hematochezia, no focal weakness, no headaches or any other complaints.  In the ED patient was found with a temperature of 105, tachycardic, respiratory rate in the 30s, soft blood pressure systolic in the low 809'X, elevated lactic acid at 2.6 and a chest x-ray 8 with positive new right lung infiltrates indicating pneumonia.  Cultures were taken, broad-spectrum antibiotics initiated and fluid resuscitation started.  [facility] hospital has [facility] to admit the patient for further evaluation and management.  Assessment & Plan: 1-sepsis due to pneumonia -Right lower lobe -Patient with history of chronic lymphocytic leukemia was actively on chemotherapy, steroids and radiation -Continue as needed antipyretics -Will continue steroids, now switching to solumedrol, to help more lungs component; continue nebulizer therapy.  -will discontinue IVF's, minimize fluids; PRN provide lasix if BP and renal allows -will continue nebulizer and narrow antibiotics to Unasyn. -repeat CXR demonstrated vascular congestion and worsening right infiltrates; suggesting re-aspiration component and most likely acute on chronic CHF (see below)  2-lactic acidosis -In the setting of sepsis -Level peaked at 3.2 and now 2.1 -no further IVF's, in the  setting of vascular congestion.  3-chronic lymphocytic leukemia -Continue holding chemotherapy for now -Oncology has seen patient; appreciate rec's -Will follow recommendations.  4-moderate malnutrition -NPO now with dysphagia and high risk for aspiration.  -appreciate dietitian rec's.  5-typical atrial flutter/intermittent episodes of A. Fib. -Patient with history of flutter; elevated heart rate at presentation in the setting of sepsis and dehydration on admission. -Rate in the 130's-140's today -discussed Dr. Bronson Ing; unable to use b-blockers or CCB with low BP; not a candidate for anticoagulation. Given non-sustained scenarion, advise to hold amiodarone; also no digoxin with renal failure and lack of acute activity on this medication. -will monitor and make decision on intermittent lopressor doses as BP allows.  6-chronic kidney disease is stage III -Appears to be at baseline after IVF's -Follow renal function trend. -Minimize nephrotoxic agents and further adjust medications as needed as for his GFR. -vancomycin discontinued.   7-history of seizure -Continue seizure precautions -Continue Keppra  8-history of type 2 diabetes with nephropathy -Continue holding oral hypoglycemic agents while inpatient -Continue sliding scale insulin. -CBG Q4 hours while NPO.  9-dysphagia -will follow Speech therapy rec's -NPO for now. -meds transition to IV.  10-pulmonary vascular congestion and acute on chronic diastolic HF -follow daily weights -strict I's and O's -minimize extra fluids -lasix X1 given yesterday -unable to provide further diuresis with low BP.  DVT prophylaxis: Heparin Code Status: DNR/DNI Family Communication: Wife at bedside Disposition Plan: Remains in stepdown, continue IV antibiotics; follow electrolytes, renal function and continue supportive care. Remains NPO, continue nebulizer and follow palliative care rec's.   Consultants:   Oncology  Palliative  care  Cardiology (Dr. Bronson Ing curbside)  Procedures:   Below for x-ray reports.  Antimicrobials:  Anti-infectives (From admission, onward)   Start  Dose/Rate Route Frequency Ordered Stop   06/10/18 1130  Ampicillin-Sulbactam (UNASYN) 3 g in sodium chloride 0.9 % 100 mL IVPB     3 g 200 mL/hr over 30 Minutes Intravenous Every 12 hours 06/10/18 1119     06/09/18 2200  ceFEPIme (MAXIPIME) 1 g in sodium chloride 0.9 % 100 mL IVPB  Status:  Discontinued     1 g 200 mL/hr over 30 Minutes Intravenous Every 24 hours 06/09/18 0751 06/10/18 1119   06/08/18 0900  vancomycin (VANCOCIN) 500 mg in sodium chloride 0.9 % 100 mL IVPB  Status:  Discontinued     500 mg 100 mL/hr over 60 Minutes Intravenous Every 24 hours 06/11/2018 1039 06/10/18 1119   05/27/2018 2200  ceFEPIme (MAXIPIME) 1 g in sodium chloride 0.9 % 100 mL IVPB  Status:  Discontinued     1 g 200 mL/hr over 30 Minutes Intravenous Every 12 hours 05/18/2018 1036 06/09/18 0751   05/24/2018 0945  ceFEPIme (MAXIPIME) 2 g in sodium chloride 0.9 % 100 mL IVPB     2 g 200 mL/hr over 30 Minutes Intravenous  Once 05/21/2018 0940 05/20/2018 1100   05/27/2018 0945  metroNIDAZOLE (FLAGYL) IVPB 500 mg  Status:  Discontinued     500 mg 100 mL/hr over 60 Minutes Intravenous Every 8 hours 05/31/2018 0940 06/10/18 1119   05/30/2018 0945  vancomycin (VANCOCIN) IVPB 1000 mg/200 mL premix     1,000 mg 200 mL/hr over 60 Minutes Intravenous  Once 05/17/2018 0940 06/10/2018 1138       Subjective: Increase labor breathing, no fever, somnolent and unable to follow commands or take PO meds.   Objective: Vitals:   06/10/18 0855 06/10/18 0900 06/10/18 0921 06/10/18 1109  BP:   93/64   Pulse:   (!) 105   Resp:   18   Temp:      TempSrc:      SpO2: 100% 100% 100% 97%  Weight:      Height:        Intake/Output Summary (Last 24 hours) at 06/10/2018 1120 Last data filed at 06/10/2018 0924 Gross per 24 hour  Intake 3787.17 ml  Output 200 ml  Net 3587.17 ml    Filed Weights   05/14/2018 0934 05/15/2018 1600 06/09/18 0400  Weight: 51.3 kg 47.9 kg 51.6 kg    Examination: General exam: currently somnolent, no following commands and with labor breathing. Using NRB mask. Respiratory system: diffuse exp wheezing and rhonchi, positive bibasilar crackles; mild use of accessory muscles and positive tachypnea.  Cardiovascular system: irregular, no rubs, no gallops, mild JVD at bases of his neck seen. Gastrointestinal system: Abdomen is nondistended, soft and nontender. No organomegaly or masses felt. Normal bowel sounds heard. Central nervous system: somnolent and unable to follow commands. Moving four limbs spontaneously.  Extremities: No C/C/E, +pedal pulses Skin: no petechiae, left temporal wound from recent SCC resection, also erythema appreciated on left side and left neck from radiation therapy.  Psychiatry: Judgement and insight unable to assess witch acute ongoing resp distress.   Data Reviewed: I have personally reviewed following labs and imaging studies  CBC: Recent Labs  Lab 05/27/2018 1000 06/08/18 0342 06/09/18 1052  WBC 11.3* 3.9* 5.1  NEUTROABS 0.7*  --   --   HGB 11.6* 10.0* 9.2*  HCT 36.1* 31.9* 29.3*  MCV 104.0* 107.0* 106.5*  PLT 90* 63* 68*   Basic Metabolic Panel: Recent Labs  Lab 06/09/2018 1000 06/08/18 0342 06/09/18 1052  NA 135 139 142  K 4.2 3.6 3.3*  CL 102 109 118*  CO2 22 19* 18*  GLUCOSE 143* 131* 193*  BUN 33* 37* 46*  CREATININE 1.97* 1.88* 1.94*  CALCIUM 8.6* 7.6* 7.5*   GFR: Estimated Creatinine Clearance: 22.2 mL/min (A) (by C-G formula based on SCr of 1.94 mg/dL (H)).   Liver Function Tests: Recent Labs  Lab 05/26/2018 1000 06/08/18 0342  AST 184* 167*  ALT 57* 86*  ALKPHOS 107 57  BILITOT 1.7* 1.1  PROT 7.8 5.8*  ALBUMIN 3.0* 2.1*   Coagulation Profile: Recent Labs  Lab 05/27/2018 1025  INR 1.22   CBG: Recent Labs  Lab 06/09/18 1613 06/09/18 1953 06/10/18 0002 06/10/18 0339  06/10/18 0733  GLUCAP 260* 154* 143* 138* 135*   Urine analysis:    Component Value Date/Time   COLORURINE YELLOW 05/23/2018 1200   APPEARANCEUR CLEAR 05/17/2018 1200   LABSPEC 1.021 05/20/2018 1200   PHURINE 6.0 05/21/2018 1200   GLUCOSEU NEGATIVE 05/18/2018 1200   HGBUR NEGATIVE 06/09/2018 1200   BILIRUBINUR NEGATIVE 06/11/2018 1200   KETONESUR 5 (A) 05/27/2018 1200   PROTEINUR 100 (A) 06/10/2018 1200   UROBILINOGEN 0.2 02/11/2015 1718   NITRITE NEGATIVE 06/09/2018 1200   LEUKOCYTESUR NEGATIVE 06/10/2018 1200    Recent Results (from the past 240 hour(s))  Blood Culture (routine x 2)     Status: None (Preliminary result)   Collection Time: 05/29/2018  9:39 AM  Result Value Ref Range Status   Specimen Description   Final    BLOOD RIGHT WRIST BOTTLES DRAWN AEROBIC AND ANAEROBIC   Special Requests   Final    Blood Culture results may not be optimal due to an inadequate volume of blood received in culture bottles   Culture   Final    NO GROWTH 3 DAYS Performed at [facility], [address], [place], [place] [address]    Report Status PENDING  Incomplete  Culture, blood (Routine x 2)     Status: None (Preliminary result)   Collection Time: 05/20/2018 10:00 AM  Result Value Ref Range Status   Specimen Description   Final    BLOOD RIGHT ARM BOTTLES DRAWN AEROBIC AND ANAEROBIC   Special Requests   Final    Blood Culture results may not be optimal due to an inadequate volume of blood received in culture bottles   Culture   Final    NO GROWTH 3 DAYS Performed at [facility], [address], [place], [place] [address]    Report Status PENDING  Incomplete  MRSA PCR Screening     Status: Abnormal   Collection Time: 06/11/2018  4:00 PM  Result Value Ref Range Status   MRSA by PCR POSITIVE (A) NEGATIVE Final    Comment:        The GeneXpert MRSA Assay (FDA approved for NASAL specimens only), is one component of a comprehensive MRSA colonization surveillance program. It is  not intended to diagnose MRSA infection nor to guide or monitor treatment for MRSA infections. RESULT CALLED TO, READ BACK BY AND VERIFIED WITH: SMITH,J @ 2232 ON 06/09/2018 BY JUW Performed at [facility], [address], [place],  [address]       Radiology Studies: Dg Chest [facility] 1 View  Result Date: 06/09/2018 CLINICAL DATA:  Cough, shortness of Breath EXAM: PORTABLE CHEST 1 VIEW COMPARISON:  06/10/2018 FINDINGS: Worsening airspace disease bilaterally with bilateral perihilar and lower lobe airspace opacities. Mild cardiomegaly. Suspect small effusions. No acute bony abnormality. IMPRESSION: Worsening bilateral perihilar  and lower lobe airspace opacities which could reflect edema or infection. Suspect small effusions. Electronically Signed   By: Rolm Baptise M.D.   On: 06/09/2018 18:45    Scheduled Meds: . acetaminophen  1,000 mg Oral Once  . budesonide (PULMICORT) nebulizer solution  0.5 mg Nebulization BID  . Chlorhexidine Gluconate Cloth  6 each Topical Q0600  . dextromethorphan-guaiFENesin  1 tablet Oral BID  . heparin injection (subcutaneous)  5,000 Units Subcutaneous Q8H  . insulin aspart  0-5 Units Subcutaneous QHS  . insulin aspart  0-9 Units Subcutaneous TID WC  . ipratropium  0.5 mg Nebulization Q6H  . mouth rinse  15 mL Mouth Rinse BID  . methylPREDNISolone (SOLU-MEDROL) injection  60 mg Intravenous Q8H  . mupirocin ointment  1 application Nasal BID  . sodium chloride flush  3 mL Intravenous Q12H   Continuous Infusions: . ampicillin-sulbactam (UNASYN) IV       LOS: 3 days    Time spent: 40 minutes. Greater than 50% of the time dedicated to face to face evaluation, coordination of care and discussion with family at bedside regarding his condition, causes of acute resp failure and worsening hypoxia. We discussed active decline, revisit GOC and formulated accepted plan for future interventions. For now will pursuit NPO status, consult palliative care team,  continue IV antibiotics, but switch to Unasyn for aspiration coverage only (minimizing amount of IVF's); follow HR and volume status, continue nebulizer management and use PRN morphine. Will acutely follow his care and make further adjustments as needed. Patient with guarded/poor prognosis.  Barton Dubois, MD [facility] Pager [phone]   06/10/2018, 11:20 AM

## 2018-06-10 NOTE — Progress Notes (Signed)
This RN spoke with [employer] representative regarding patient's pacemaker. Patient has a pacemaker only - with no defibrillation action. [employer] made aware that this patient has been transitioned to comfort care. This RN hung morphine gtt (see MAR for time) and also gave two boluses per orders at 1615 and 1638.  Celestia Khat, RN

## 2018-06-10 NOTE — Consult Note (Signed)
[facility] Consultation Oncology  Name: Randall Reyes      MRN: [id number]    Location: IC01/IC01-01  Date: 06/10/2018 Time:4:47 PM   REFERRING PHYSICIAN: Dr. Dyann Kief.  REASON FOR CONSULT: CLL and squamous cell cancer of skin.   DIAGNOSIS: Sepsis from pneumonia.  HISTORY OF PRESENT ILLNESS: Randall Reyes is a 83 year old very pleasant white male who is seen in consultation today for further work-up of stage IV CLL.  He is currently on ibrutinib 2 tablets daily which was held since 06/06/2018.  According to the wife he was having fevers since 06/02/2018 along with cough.  2 days later he developed sore throat.  He had a fever of 103 on Friday evening.  EMS was called and he was brought to the emergency room and he passed out.  He had a temperature of 105 and a right middle lobe pneumonia.  He is being treated with broad-spectrum antibiotics with vancomycin and cefepime.  His chest x-ray showed slight worsening of pneumonia.  As per wife, he is unable to talk.  He was thought to have aspiration pneumonia.  He has been receiving radiation therapy to the left temporal area for positive margins of his squamous cell carcinoma.  He has been afebrile for the last 24 to 48 hours.  He also started on stress dose steroids.  His lactic acid levels are coming down.  PAST MEDICAL HISTORY:   Past Medical History:  Diagnosis Date  . Anemia   . B12 deficiency 12/07/2014  . Chronic kidney disease    CKD III  . CLL (chronic lymphocytic leukemia) ([place]) 01/24/2011  . Diabetes mellitus   . DM (diabetes mellitus) ([place]) 01/24/2011  . Eczema 01/24/2011  . History of pneumonia 04/2014  . Hypertension   . Leukemia ([place]) 6.22.2012   PER PATIENT  . Melanoma in situ ([place]) 01/24/2011  . Mild obesity 01/24/2011  . Pneumonia    2018  . Port catheter in place 05/02/2012  . Presence of permanent cardiac pacemaker    [employer]  . Shingles   . Squamous cell carcinoma in situ    left forehead  receiving  radiation  . Ulcer     ALLERGIES: Allergies  Allergen Reactions  . Aspirin Other (See Comments)    Bleeding ulcers.   . Ibuprofen Other (See Comments)    Bleeding ulcers.      MEDICATIONS: I have reviewed the patient's current medications.     PAST SURGICAL HISTORY Past Surgical History:  Procedure Laterality Date  . bleeding ulcer    . CATARACT EXTRACTION W/PHACO  05/05/2012   Procedure: CATARACT EXTRACTION PHACO AND INTRAOCULAR LENS PLACEMENT (IOC);  Surgeon: Tonny Branch, MD;  Location: [facility];  Service: Ophthalmology;  Laterality: Right;  CDE:13.25  . CATARACT EXTRACTION W/PHACO  05/15/2012   Procedure: CATARACT EXTRACTION PHACO AND INTRAOCULAR LENS PLACEMENT (IOC);  Surgeon: Tonny Branch, MD;  Location: [facility];  Service: Ophthalmology;  Laterality: Left;  CDE:  12.32  . COLONOSCOPY N/A 01/30/2016   Procedure: COLONOSCOPY;  Surgeon: Danie Binder, MD;  Location: [facility];  Service: Endoscopy;  Laterality: N/A;  2:00 PM - moved to 1:00 - office notified pt  . ESOPHAGOGASTRODUODENOSCOPY  2008   Dr. Oneida Alar: normal esophagus, antral erythema, 1 cm clean based duodenal ulcera, negative H. plyori  . ESOPHAGOGASTRODUODENOSCOPY N/A 01/30/2016   Procedure: ESOPHAGOGASTRODUODENOSCOPY (EGD);  Surgeon: Danie Binder, MD;  [facility];  Service: Endoscopy;  Laterality: N/A;  . EXCISION MASS HEAD  Left 03/10/2018   Procedure: EXCISION MASS HEAD;  Surgeon: Melissa Montane, MD;  Location: [place];  Service: ENT;  Laterality: Left;  . EYE SURGERY    . HERNIA REPAIR  2001  . PACEMAKER IMPLANT N/A 02/07/2017   Procedure: PACEMAKER IMPLANT;  Surgeon: Constance Haw, MD;  Location: [place] [facility];  Service: Cardiovascular;  Laterality: N/A;  . PAROTIDECTOMY Left 03/10/2018   LEFT SIDE PAROTIDECTOMTY AND NECK DISECTION WITH RESECTION OF A FOREHEAD LESION  . PAROTIDECTOMY Left 03/10/2018   Procedure: LEFT SIDE PAROTIDECTOMTY AND NECK DISECTION WITH RESECTION OF A FOREHEAD LESION;   Surgeon: Melissa Montane, MD;  Location: [place];  Service: ENT;  Laterality: Left;  . PORTACATH PLACEMENT  2008  . SKIN FULL THICKNESS GRAFT Left 03/10/2018   Procedure: SKIN GRAFT FULL THICKNESS;  Surgeon: Melissa Montane, MD;  Location: [place];  Service: ENT;  Laterality: Left;  . TONSILLECTOMY     removed around 38-3 years of age    FAMILY HISTORY: Family History  Problem Relation Age of Onset  . Diabetes Father   . Colon cancer Neg Hx     SOCIAL HISTORY:  reports that he quit smoking about 40 years ago. He quit smokeless tobacco use about 51 years ago. He reports that he does not drink alcohol or use drugs.  PERFORMANCE STATUS: The patient's performance status is 3 - Symptomatic, >50% confined to bed  PHYSICAL EXAM: Most Recent Vital Signs: Blood pressure 93/64, pulse (!) 105, temperature (!) 97.5 F (36.4 C), temperature source Axillary, resp. rate 18, height 5\' 2"  (1.575 m), weight 113 lb 12.1 oz (51.6 kg), SpO2 100 %. BP 93/64   Pulse (!) 105   Temp (!) 97.5 F (36.4 C) (Axillary)   Resp 18   Ht 5\' 2"  (1.575 m)   Wt 113 lb 12.1 oz (51.6 kg)   SpO2 100%   BMI 20.81 kg/m  General appearance: He is sleeping in bed. Lungs: Bilateral air entry with wheezes and rhonchi. Heart: irregularly irregular rhythm Abdomen: soft, non-tender; bowel sounds normal; no masses,  no organomegaly Extremities: 1+ edema bilaterally.  LABORATORY DATA:  Results for orders placed or performed during the hospital encounter of 06/01/2018 (from the past 48 hour(s))  Glucose, capillary     Status: Abnormal   Collection Time: 06/08/18  5:10 PM  Result Value Ref Range   Glucose-Capillary 104 (H) 70 - 99 mg/dL  Glucose, capillary     Status: None   Collection Time: 06/08/18  9:04 PM  Result Value Ref Range   Glucose-Capillary 99 70 - 99 mg/dL  Glucose, capillary     Status: None   Collection Time: 06/09/18  7:27 AM  Result Value Ref Range   Glucose-Capillary 87 70 - 99 mg/dL  CBC     Status: Abnormal    Collection Time: 06/09/18 10:52 AM  Result Value Ref Range   WBC 5.1 4.0 - 10.5 K/uL   RBC 2.75 (L) 4.22 - 5.81 MIL/uL   Hemoglobin 9.2 (L) 13.0 - 17.0 g/dL   HCT 29.3 (L) 39.0 - 52.0 %   MCV 106.5 (H) 80.0 - 100.0 fL   MCH 33.5 26.0 - 34.0 pg   MCHC 31.4 30.0 - 36.0 g/dL   RDW 18.9 (H) 11.5 - 15.5 %   Platelets 68 (L) 150 - 400 K/uL    Comment: REPEATED TO VERIFY SPECIMEN CHECKED FOR CLOTS Immature Platelet Fraction may be clinically indicated, consider ordering this additional test [id number] CONSISTENT WITH  PREVIOUS RESULT    nRBC 0.0 0.0 - 0.2 %    Comment: Performed at [facility], [address] [place], [place] [address]  Basic metabolic panel     Status: Abnormal   Collection Time: 06/09/18 10:52 AM  Result Value Ref Range   Sodium 142 135 - 145 mmol/L   Potassium 3.3 (L) 3.5 - 5.1 mmol/L   Chloride 118 (H) 98 - 111 mmol/L   CO2 18 (L) 22 - 32 mmol/L   Glucose, Bld 193 (H) 70 - 99 mg/dL   BUN 46 (H) 8 - 23 mg/dL   Creatinine, Ser 1.94 (H) 0.61 - 1.24 mg/dL   Calcium 7.5 (L) 8.9 - 10.3 mg/dL   GFR calc non Af Amer 32 (L) >60 mL/min   GFR calc Af Amer 37 (L) >60 mL/min   Anion gap 6 5 - 15    Comment: Performed at [facility], [address] [place], [place] [address]  Lactic acid, plasma     Status: Abnormal   Collection Time: 06/09/18 10:52 AM  Result Value Ref Range   Lactic Acid, Venous 2.1 (HH) 0.5 - 1.9 mmol/L    Comment: CRITICAL RESULT CALLED TO, READ BACK BY AND VERIFIED WITH: [email or web address],[id number]  BY MATTHEWS, B 1.27.2020 Performed at [facility], [address] [place], [place] [address]   Glucose, capillary     Status: Abnormal   Collection Time: 06/09/18 11:38 AM  Result Value Ref Range   Glucose-Capillary 210 (H) 70 - 99 mg/dL  Glucose, capillary     Status: Abnormal   Collection Time: 06/09/18  4:13 PM  Result Value Ref Range   Glucose-Capillary 260 (H) 70 - 99 mg/dL  Glucose, capillary     Status: Abnormal   Collection Time: 06/09/18  7:53 PM   Result Value Ref Range   Glucose-Capillary 154 (H) 70 - 99 mg/dL   Comment 1 Notify RN    Comment 2 Document in Chart   Glucose, capillary     Status: Abnormal   Collection Time: 06/10/18 12:02 AM  Result Value Ref Range   Glucose-Capillary 143 (H) 70 - 99 mg/dL   Comment 1 Notify RN    Comment 2 Document in Chart   Glucose, capillary     Status: Abnormal   Collection Time: 06/10/18  3:39 AM  Result Value Ref Range   Glucose-Capillary 138 (H) 70 - 99 mg/dL   Comment 1 Notify RN    Comment 2 Document in Chart   Glucose, capillary     Status: Abnormal   Collection Time: 06/10/18  7:33 AM  Result Value Ref Range   Glucose-Capillary 135 (H) 70 - 99 mg/dL  Glucose, capillary     Status: Abnormal   Collection Time: 06/10/18 12:03 PM  Result Value Ref Range   Glucose-Capillary 135 (H) 70 - 99 mg/dL      RADIOGRAPHY: Dg Chest Port 1 View  Result Date: 06/09/2018 CLINICAL DATA:  Cough, shortness of Breath EXAM: PORTABLE CHEST 1 VIEW COMPARISON:  06/05/2018 FINDINGS: Worsening airspace disease bilaterally with bilateral perihilar and lower lobe airspace opacities. Mild cardiomegaly. Suspect small effusions. No acute bony abnormality. IMPRESSION: Worsening bilateral perihilar and lower lobe airspace opacities which could reflect edema or infection. Suspect small effusions. Electronically Signed   By: Rolm Baptise M.D.   On: 06/09/2018 18:45      ASSESSMENT and PLAN:  1.  Sepsis secondary to pneumonia: - Pneumonia in the setting of immunosuppression and radiation therapy for his  squamous cell cancer of the skin. -Patient on broad-spectrum antibiotics including vancomycin and cefepime.  He is also receiving stress dose steroids.  Systolic blood pressure is between 80-90.  Lactic acid is improving.  Patient is also afebrile for the last 24-48 hours. - Prognosis is rather poor at this point. - Discussed with wife who is at bedside.  2.  CLL: -I agree with holding ibrutinib this time. -I  have reviewed blood work.  White count is normal.  Platelet count is stable around 60.  All questions were answered. The patient knows to call the clinic with any problems, questions or concerns. We can certainly see the patient much sooner if necessary.   Randall Reyes

## 2018-06-11 NOTE — Progress Notes (Signed)
PROGRESS NOTE    Randall Reyes  LOV:[id number] DOB: 05/01/1936 DOA: 05/15/2018 PCP: [name], MD   Brief Narrative:  83 y.o.malewith a past medical history significant for type 2 diabetes mellitus, chronic kidney disease stage III, chronic lymphocytic leukemia, melanoma/squamous cell carcinoma in situandmoderate protein calorie malnutrition; who presented to the hospital secondary to episode of syncope, fever and general malaise. Patient also reports ongoing coughing spells, that has become productive of yellowish/greenish sputum. He also expressed associated nausea and decrease in his appetite.No chest pain, no vomiting, no dysuria, no hematuria, no melena, no hematochezia, no focal weakness, no headaches or any other complaints.  In the ED patient was found with a temperature of 105, tachycardic, respiratory rate in the 30s, soft blood pressure systolic in the low 884'Z, elevated lactic acid at 2.6 and a chest x-ray 8 with positive new right lung infiltrates indicating pneumonia. Cultures were taken, broad-spectrum antibiotics initiated and fluid resuscitation started. [facility] the patient for further evaluation and management.   Assessment & Plan:   Principal Problem:   Sepsis due to pneumonia [facility]) Active Problems:   CLL (chronic lymphocytic leukemia) (HCC)   Malnutrition of moderate degree (HCC)   Typical atrial flutter (HCC)   CKD (chronic kidney disease), stage III ([place])   Palliative care by specialist   End of life care  1-sepsis due to pneumonia  2-lactic acidosis  3-chronic lymphocytic leukemia  4-moderate malnutrition  5-typical atrial flutter/intermittent episodes of A. Fib.  6-chronic kidney disease is stage III  7-history of seizure  8-history of type 2 diabetes with nephropathy  9-dysphagia  10-pulmonary vascular congestion and acute on chronic diastolic HF  DVT prophylaxis: None Code Status:  DNR/DNI-Comfort measures Family Communication: Wife at bedside Disposition Plan: Continue on comfort measures; transfer to floor  Consultants:   Oncology  Palliative care  Cardiology (Dr. Bronson Ing curbside)  Procedures:   Below for x-ray reports.   Subjective: Patient seen and evaluated today with no new acute complaints or concerns. No acute concerns or events noted overnight.  Objective: Vitals:   06/10/18 2300 06/11/18 0100 06/11/18 0300 06/11/18 0500  BP:      Pulse: 86 88 83 75  Resp: (!) 9 (!) 9 16 15   Temp:      TempSrc:      SpO2: 90% (!) 88% (!) 88% (!) 89%  Weight:      Height:        Intake/Output Summary (Last 24 hours) at 06/11/2018 1646 Last data filed at 06/11/2018 0655 Gross per 24 hour  Intake 247.11 ml  Output -  Net 247.11 ml   Filed Weights   05/25/2018 0934 06/05/2018 1600 06/09/18 0400  Weight: 51.3 kg 47.9 kg 51.6 kg    Examination:  General exam: Appears calm and comfortable  Respiratory system: Clear to auscultation. Respiratory effort normal. Cardiovascular system: S1 & S2 heard, RRR. No JVD, murmurs, rubs, gallops or clicks. No pedal edema. Gastrointestinal system: Abdomen is nondistended, soft and nontender. No organomegaly or masses felt. Normal bowel sounds heard. Central nervous system: Alert and oriented. No focal neurological deficits. Extremities: Symmetric 5 x 5 power. Skin: No rashes, lesions or ulcers Psychiatry: Judgement and insight appear normal. Mood & affect appropriate.     Data Reviewed: I have personally reviewed following labs and imaging studies  CBC: Recent Labs  Lab 06/04/2018 1000 06/08/18 0342 06/09/18 1052  WBC 11.3* 3.9* 5.1  NEUTROABS 0.7*  --   --  HGB 11.6* 10.0* 9.2*  HCT 36.1* 31.9* 29.3*  MCV 104.0* 107.0* 106.5*  PLT 90* 63* 68*   Basic Metabolic Panel: Recent Labs  Lab 05/30/2018 1000 06/08/18 0342 06/09/18 1052  NA 135 139 142  K 4.2 3.6 3.3*  CL 102 109 118*  CO2 22 19* 18*    GLUCOSE 143* 131* 193*  BUN 33* 37* 46*  CREATININE 1.97* 1.88* 1.94*  CALCIUM 8.6* 7.6* 7.5*   GFR: Estimated Creatinine Clearance: 22.2 mL/min (A) (by C-G formula based on SCr of 1.94 mg/dL (H)). Liver Function Tests: Recent Labs  Lab 05/18/2018 1000 06/08/18 0342  AST 184* 167*  ALT 57* 86*  ALKPHOS 107 57  BILITOT 1.7* 1.1  PROT 7.8 5.8*  ALBUMIN 3.0* 2.1*   No results for input(s): LIPASE, AMYLASE in the last 168 hours. No results for input(s): AMMONIA in the last 168 hours. Coagulation Profile: Recent Labs  Lab 05/24/2018 1025  INR 1.22   Cardiac Enzymes: No results for input(s): CKTOTAL, CKMB, CKMBINDEX, TROPONINI in the last 168 hours. BNP (last 3 results) No results for input(s): PROBNP in the last 8760 hours. HbA1C: No results for input(s): HGBA1C in the last 72 hours. CBG: Recent Labs  Lab 06/09/18 1953 06/10/18 0002 06/10/18 0339 06/10/18 0733 06/10/18 1203  GLUCAP 154* 143* 138* 135* 135*   Lipid Profile: No results for input(s): CHOL, HDL, LDLCALC, TRIG, CHOLHDL, LDLDIRECT in the last 72 hours. Thyroid Function Tests: No results for input(s): TSH, T4TOTAL, FREET4, T3FREE, THYROIDAB in the last 72 hours. Anemia Panel: No results for input(s): VITAMINB12, FOLATE, FERRITIN, TIBC, IRON, RETICCTPCT in the last 72 hours. Sepsis Labs: Recent Labs  Lab 06/08/18 0342 06/08/18 0952 06/08/18 1333 06/09/18 1052  LATICACIDVEN 3.2* 2.3* 2.6* 2.1*    Recent Results (from the past 240 hour(s))  Blood Culture (routine x 2)     Status: None (Preliminary result)   Collection Time: 05/25/2018  9:39 AM  Result Value Ref Range Status   Specimen Description   Final    BLOOD RIGHT WRIST BOTTLES DRAWN AEROBIC AND ANAEROBIC   Special Requests   Final    Blood Culture results may not be optimal due to an inadequate volume of blood received in culture bottles   Culture   Final    NO GROWTH 4 DAYS Performed at [facility], [address] [place], [place] [address]     Report Status PENDING  Incomplete  Culture, blood (Routine x 2)     Status: None (Preliminary result)   Collection Time: 05/18/2018 10:00 AM  Result Value Ref Range Status   Specimen Description   Final    BLOOD RIGHT ARM BOTTLES DRAWN AEROBIC AND ANAEROBIC   Special Requests   Final    Blood Culture results may not be optimal due to an inadequate volume of blood received in culture bottles   Culture   Final    NO GROWTH 4 DAYS Performed at [facility], [address], [place], [place] [address]    Report Status PENDING  Incomplete  MRSA PCR Screening     Status: Abnormal   Collection Time: 06/09/2018  4:00 PM  Result Value Ref Range Status   MRSA by PCR POSITIVE (A) NEGATIVE Final    Comment:        The GeneXpert MRSA Assay (FDA approved for NASAL specimens only), is one component of a comprehensive MRSA colonization surveillance program. It is not intended to diagnose MRSA infection nor to guide or monitor  treatment for MRSA infections. RESULT CALLED TO, READ BACK BY AND VERIFIED WITH: SMITH,J @ 2232 ON 05/30/2018 BY JUW Performed at [facility], [address] [place], [place] [address]          Radiology Studies: Dg Chest [facility] 1 View  Result Date: 06/09/2018 CLINICAL DATA:  Cough, shortness of Breath EXAM: PORTABLE CHEST 1 VIEW COMPARISON:  05/18/2018 FINDINGS: Worsening airspace disease bilaterally with bilateral perihilar and lower lobe airspace opacities. Mild cardiomegaly. Suspect small effusions. No acute bony abnormality. IMPRESSION: Worsening bilateral perihilar and lower lobe airspace opacities which could reflect edema or infection. Suspect small effusions. Electronically Signed   By: Rolm Baptise M.D.   On: 06/09/2018 18:45        Scheduled Meds: . mouth rinse  15 mL Mouth Rinse BID  . sodium chloride flush  3 mL Intravenous Q12H   Continuous Infusions: . morphine 3 mg/hr (06/11/18 0655)     LOS: 4 days    Time spent: 30 minutes    Dymond Spreen Darleen Crocker, DO [facility] Pager [phone]  If 7PM-7AM, please contact night-coverage [email or web address] Password [facility] 06/11/2018, 4:46 PM

## 2018-06-12 ENCOUNTER — Encounter (HOSPITAL_COMMUNITY): Payer: Self-pay | Admitting: Hematology

## 2018-06-12 LAB — CULTURE, BLOOD (ROUTINE X 2)
Culture: NO GROWTH
Culture: NO GROWTH

## 2018-06-14 NOTE — Progress Notes (Signed)
Palliative: [name] is resting quietly in bed, he does not interact in any way.  He appears to be actively dying, but comfortable.  Respiratory rate and work of breathing are controlled with morphine continuous infusion.  Family at bedside.  Conference with hospitalist and nursing staff at bedside related to comfort care, patient family needs.  Continue to anticipate hospital death within the next 24 hours, he is too unstable for transfer at this point.  63 minutes [name], NP Palliative Medicine Team Team Phone # [phone]  Greater than 50% of this time was spent counseling and coordinating care related to the above assessment and plan.

## 2018-06-14 NOTE — Death Summary Note (Signed)
Physician Discharge Summary  Randall Reyes ASN:[id number] DOB: 24-Oct-1935 DOA: June 08, 2018  PCP: [name], MD  Admit date: 06-08-18  Death date: 13-Jun-2018 August 07, 1000  Admitted From:Home  Disposition:  Expired  Brief/Interim Summary: Per HPI: 83 y.o.malewith a past medical history significant for type 2 diabetes mellitus, chronic kidney disease stage III, chronic lymphocytic leukemia, melanoma/squamous cell carcinoma in situandmoderate protein calorie malnutrition; who presented to the hospital secondary to episode of syncope, fever and general malaise. Patient also reports ongoing coughing spells, that has become productive of yellowish/greenish sputum. He also expressed associated nausea and decrease in his appetite.No chest pain, no vomiting, no dysuria, no hematuria, no melena, no hematochezia, no focal weakness, no headaches or any other complaints.  In the ED patient was found with a temperature of 105, tachycardic, respiratory rate in the 30s, soft blood pressure systolic in the low 193'X, elevated lactic acid at 2.6 and a chest x-ray 8 with positive new right lung infiltrates indicating pneumonia. Cultures were taken, broad-spectrum antibiotics initiated and fluid resuscitation started. [facility] to admit the patient for further evaluation and management.  Patient was admitted for sepsis secondary to pneumonia and was started on antibiotic treatment initially with vancomycin, Flagyl, and cefepime and then transitioned to Unasyn.  He was noted to have multiple medical issues to include chronic lymphocytic leukemia for which chemotherapy was being held and was not recommended in the near future per oncology.  He was noted to have moderate malnutrition as well as intermittent episodes of atrial fibrillation.  He was also noted to have chronic diastolic heart failure with CKD stage III and ongoing issues with dysphagia.  He was seen by palliative care and family  had elected full comfort care as he appeared to be actively dying.  He was placed on continuous morphine infusion that began on 1/28.  He had remained comfortable and was without any signs of physical suffering.  He passed on 2018/06/13 at 10:02 AM.  Discharge Diagnoses:  Principal Problem:   Sepsis due to pneumonia [facility]) Active Problems:   CLL (chronic lymphocytic leukemia) (HCC)   Malnutrition of moderate degree (HCC)   Typical atrial flutter (HCC)   CKD (chronic kidney disease), stage III ([place])   Palliative care by specialist   End of life care    Allergies  Allergen Reactions  . Aspirin Other (See Comments)    Bleeding ulcers.   . Ibuprofen Other (See Comments)    Bleeding ulcers.    Consultations:  Oncology  Palliative Care   Procedures/Studies: Ct Head Wo Contrast  Result Date: 06/08/2018 CLINICAL DATA:  Leukemia, currently receiving treatment. Syncopal episode today. Altered level of consciousness. Cough for months. EXAM: CT HEAD WITHOUT CONTRAST TECHNIQUE: Contiguous axial images were obtained from the base of the skull through the vertex without intravenous contrast. COMPARISON:  Head CT dated 08/26/2016. FINDINGS: Brain: Ventricles are stable in size and configuration. There is no mass, hemorrhage, edema or other evidence of acute parenchymal abnormality. No extra-axial hemorrhage. Vascular: Chronic calcified atherosclerotic changes of the large vessels at the skull base. No unexpected hyperdense vessel. Skull: Scalp (soft tissue) defect overlying the LEFT frontotemporal bone, with lucency along the course of the underlying suture, compatible with postsurgical change per the emergency room physician. Skull appears otherwise unremarkable. Sinuses/Orbits: Mild mucosal thickening within the ethmoid air cells. Fluid within the bilateral mastoid air cells. Fluid also extends to the RIGHT middle ear cavity. Periorbital and retro-orbital soft tissues are unremarkable. Other: None.  IMPRESSION: 1. No acute intracranial abnormality. No intracranial mass, hemorrhage or edema. 2. Scalp (soft tissue) defect overlying the LEFT frontotemporal bone, with lucency along the course of the underlying suture, compatible with postsurgical change per the emergency room physician. 3. Fluid within the bilateral mastoid air cells and RIGHT middle ear cavity, of uncertain chronicity but favored to be chronic. Electronically Signed   By: Franki Cabot M.D.   On: 06/10/2018 12:01   Dg Chest Port 1 View  Result Date: 06/09/2018 CLINICAL DATA:  Cough, shortness of Breath EXAM: PORTABLE CHEST 1 VIEW COMPARISON:  06/04/2018 FINDINGS: Worsening airspace disease bilaterally with bilateral perihilar and lower lobe airspace opacities. Mild cardiomegaly. Suspect small effusions. No acute bony abnormality. IMPRESSION: Worsening bilateral perihilar and lower lobe airspace opacities which could reflect edema or infection. Suspect small effusions. Electronically Signed   By: Rolm Baptise M.D.   On: 06/09/2018 18:45   Dg Chest Port 1 View  Result Date: 05/27/2018 CLINICAL DATA:  Leukemia, cough. EXAM: PORTABLE CHEST 1 VIEW COMPARISON:  Chest x-rays dated 05/06/2018 and 11/16/2017. PET-CT dated 05/05/2018. FINDINGS: New masslike opacity within the RIGHT perihilar lung. LEFT lung remains clear. Heart size and mediastinal contours are stable. LEFT chest wall pacemaker leads appear stable in position. IMPRESSION: New masslike opacity within the RIGHT perihilar lung. This could represent pneumonia, aspiration, or neoplasm. Patient has had a previous RIGHT perihilar pneumonia (chest CT dated 12/31/2017), therefore, favor recurrent pneumonia. Recommend CT chest with contrast for further evaluation. Electronically Signed   By: Franki Cabot M.D.   On: 05/23/2018 10:37     Discharge Exam: Vitals:   2018/07/05 0300 07-05-2018 0500  BP:    Pulse: (!) 107 (!) 103  Resp:    Temp:    SpO2: (!) 89% (!) 80%   Vitals:    06/11/18 2300 05-Jul-2018 0100 July 05, 2018 0300 2018/07/05 0500  BP:      Pulse: (!) 103 100 (!) 107 (!) 103  Resp:      Temp:      TempSrc:      SpO2: 91% (!) 88% (!) 89% (!) 80%  Weight:      Height:        The results of significant diagnostics from this hospitalization (including imaging, microbiology, ancillary and laboratory) are listed below for reference.     Microbiology: Recent Results (from the past 240 hour(s))  Blood Culture (routine x 2)     Status: None   Collection Time: 05/22/2018  9:39 AM  Result Value Ref Range Status   Specimen Description   Final    BLOOD RIGHT WRIST BOTTLES DRAWN AEROBIC AND ANAEROBIC   Special Requests   Final    Blood Culture results may not be optimal due to an inadequate volume of blood received in culture bottles   Culture   Final    NO GROWTH 5 DAYS Performed at [facility], [address], [place], [place] [address]    Report Status 05-Jul-2018 FINAL  Final  Culture, blood (Routine x 2)     Status: None   Collection Time: 06/03/2018 10:00 AM  Result Value Ref Range Status   Specimen Description   Final    BLOOD RIGHT ARM BOTTLES DRAWN AEROBIC AND ANAEROBIC   Special Requests   Final    Blood Culture results may not be optimal due to an inadequate volume of blood received in culture bottles   Culture   Final    NO GROWTH 5 DAYS Performed at  [facility], [address] [place], [place] [address]    Report Status Jul 09, 2018 FINAL  Final  MRSA PCR Screening     Status: Abnormal   Collection Time: 06/01/2018  4:00 PM  Result Value Ref Range Status   MRSA by PCR POSITIVE (A) NEGATIVE Final    Comment:        The GeneXpert MRSA Assay (FDA approved for NASAL specimens only), is one component of a comprehensive MRSA colonization surveillance program. It is not intended to diagnose MRSA infection nor to guide or monitor treatment for MRSA infections. RESULT CALLED TO, READ BACK BY AND VERIFIED WITH: SMITH,J @ 2232 ON 06/05/2018 BY  JUW Performed at [facility], [address], [place], [place] [address]      Labs: BNP (last 3 results) No results for input(s): BNP in the last 8760 hours. Basic Metabolic Panel: Recent Labs  Lab 06/09/2018 1000 06/08/18 0342 06/09/18 1052  NA 135 139 142  K 4.2 3.6 3.3*  CL 102 109 118*  CO2 22 19* 18*  GLUCOSE 143* 131* 193*  BUN 33* 37* 46*  CREATININE 1.97* 1.88* 1.94*  CALCIUM 8.6* 7.6* 7.5*   Liver Function Tests: Recent Labs  Lab 05/30/2018 1000 06/08/18 0342  AST 184* 167*  ALT 57* 86*  ALKPHOS 107 57  BILITOT 1.7* 1.1  PROT 7.8 5.8*  ALBUMIN 3.0* 2.1*   No results for input(s): LIPASE, AMYLASE in the last 168 hours. No results for input(s): AMMONIA in the last 168 hours. CBC: Recent Labs  Lab 06/08/2018 1000 06/08/18 0342 06/09/18 1052  WBC 11.3* 3.9* 5.1  NEUTROABS 0.7*  --   --   HGB 11.6* 10.0* 9.2*  HCT 36.1* 31.9* 29.3*  MCV 104.0* 107.0* 106.5*  PLT 90* 63* 68*   Cardiac Enzymes: No results for input(s): CKTOTAL, CKMB, CKMBINDEX, TROPONINI in the last 168 hours. BNP: Invalid input(s): POCBNP CBG: Recent Labs  Lab 06/09/18 1953 06/10/18 0002 06/10/18 0339 06/10/18 0733 06/10/18 1203  GLUCAP 154* 143* 138* 135* 135*   D-Dimer No results for input(s): DDIMER in the last 72 hours. Hgb A1c No results for input(s): HGBA1C in the last 72 hours. Lipid Profile No results for input(s): CHOL, HDL, LDLCALC, TRIG, CHOLHDL, LDLDIRECT in the last 72 hours. Thyroid function studies No results for input(s): TSH, T4TOTAL, T3FREE, THYROIDAB in the last 72 hours.  Invalid input(s): FREET3 Anemia work up No results for input(s): VITAMINB12, FOLATE, FERRITIN, TIBC, IRON, RETICCTPCT in the last 72 hours. Urinalysis    Component Value Date/Time   COLORURINE YELLOW 06/06/2018 1200   APPEARANCEUR CLEAR 06/06/2018 1200   LABSPEC 1.021 05/20/2018 1200   PHURINE 6.0 05/14/2018 1200   GLUCOSEU NEGATIVE 05/15/2018 1200   HGBUR NEGATIVE 06/09/2018 1200    BILIRUBINUR NEGATIVE 05/31/2018 1200   KETONESUR 5 (A) 06/02/2018 1200   PROTEINUR 100 (A) 06/13/2018 1200   UROBILINOGEN 0.2 02/11/2015 1718   NITRITE NEGATIVE 06/04/2018 1200   LEUKOCYTESUR NEGATIVE 06/03/2018 1200   Sepsis Labs Invalid input(s): PROCALCITONIN,  WBC,  LACTICIDVEN Microbiology Recent Results (from the past 240 hour(s))  Blood Culture (routine x 2)     Status: None   Collection Time: 05/14/2018  9:39 AM  Result Value Ref Range Status   Specimen Description   Final    BLOOD RIGHT WRIST BOTTLES DRAWN AEROBIC AND ANAEROBIC   Special Requests   Final    Blood Culture results may not be optimal due to an inadequate volume of blood received in culture  bottles   Culture   Final    NO GROWTH 5 DAYS Performed at [facility], [address], [place], [place] [address]    Report Status Jun 13, 2018 FINAL  Final  Culture, blood (Routine x 2)     Status: None   Collection Time: 05/18/2018 10:00 AM  Result Value Ref Range Status   Specimen Description   Final    BLOOD RIGHT ARM BOTTLES DRAWN AEROBIC AND ANAEROBIC   Special Requests   Final    Blood Culture results may not be optimal due to an inadequate volume of blood received in culture bottles   Culture   Final    NO GROWTH 5 DAYS Performed at [facility], [address] [place], [place] [address]    Report Status 06-13-2018 FINAL  Final  MRSA PCR Screening     Status: Abnormal   Collection Time: 05/24/2018  4:00 PM  Result Value Ref Range Status   MRSA by PCR POSITIVE (A) NEGATIVE Final    Comment:        The GeneXpert MRSA Assay (FDA approved for NASAL specimens only), is one component of a comprehensive MRSA colonization surveillance program. It is not intended to diagnose MRSA infection nor to guide or monitor treatment for MRSA infections. RESULT CALLED TO, READ BACK BY AND VERIFIED WITH: SMITH,J @ 2232 ON 05/20/2018 BY JUW Performed at [facility], [address] [place], [place] [address]      Time  coordinating discharge: 35 minutes  SIGNED:   Rodena Goldmann, DO [facility] 06/13/2018, 10:41 AM Pager [phone]  If 7PM-7AM, please contact night-coverage [email or web address] Password [email or web address]

## 2018-06-14 DEATH — deceased

## 2018-06-19 ENCOUNTER — Telehealth: Payer: Self-pay | Admitting: *Deleted

## 2018-06-19 NOTE — Telephone Encounter (Signed)
Randall Reyes called in to report her husband had passed away on 09/09/22.  She was inquiring as to how to return his pacemaker monitoring equipment.  Please call.

## 2018-06-20 ENCOUNTER — Encounter (HOSPITAL_COMMUNITY): Payer: Self-pay | Admitting: *Deleted

## 2018-06-20 NOTE — Telephone Encounter (Signed)
Routing to device clinic to call wife and explain how to return monitor

## 2018-06-20 NOTE — Telephone Encounter (Signed)
Spoke w/ pt wife. Informed her that I would order a return kit and have it sent to their home. Once the return kit is received she can pack the monitor up and send it back to [employer]. Pt wife verbalized understanding.

## 2018-06-20 NOTE — Progress Notes (Signed)
Insurance papers filled out. Wife or son will pick them up at the front desk of our clinic this afternoon.

## 2018-06-26 ENCOUNTER — Ambulatory Visit (HOSPITAL_COMMUNITY): Payer: Medicare Other | Admitting: Hematology

## 2018-06-26 ENCOUNTER — Other Ambulatory Visit (HOSPITAL_COMMUNITY): Payer: Medicare Other

## 2018-06-26 ENCOUNTER — Ambulatory Visit (HOSPITAL_COMMUNITY): Payer: Medicare Other

## 2018-07-15 ENCOUNTER — Encounter: Payer: Medicare Other | Admitting: Cardiology

## 2018-12-18 IMAGING — CT CT CHEST W/O CM
2 of 3 series · 15 of 36 positions shown, 18 images · non-contrast
Comparison: Chest radiograph 12/17/2017, CT chest 10/04/2017

CLINICAL DATA: Lung nodule, abnormal chest radiograph with RIGHT
middle lobe density, history of CLL, diabetes mellitus, hypertension

EXAM:
CT CHEST WITHOUT CONTRAST
TECHNIQUE: Multidetector CT imaging of the chest was performed following the
standard protocol without IV contrast. Sagittal and coronal MPR
images reconstructed from axial data set.

[Series 2: thorax · axial · 0.63mm/px · z∈[+1268,+1524]mm · 12 of 152 slices shown, 15 images]
[im 12/152  mediastinal]
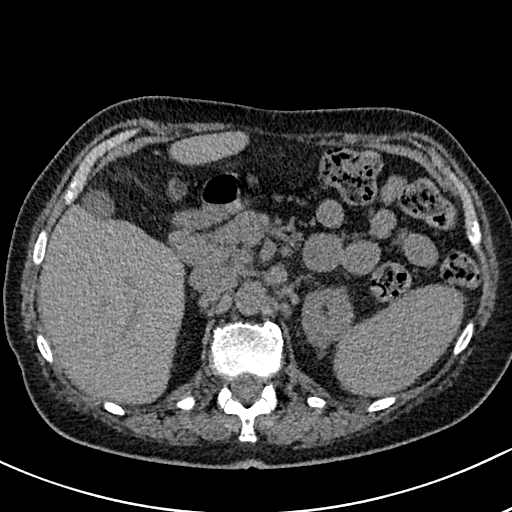
[im 12/152  lung]
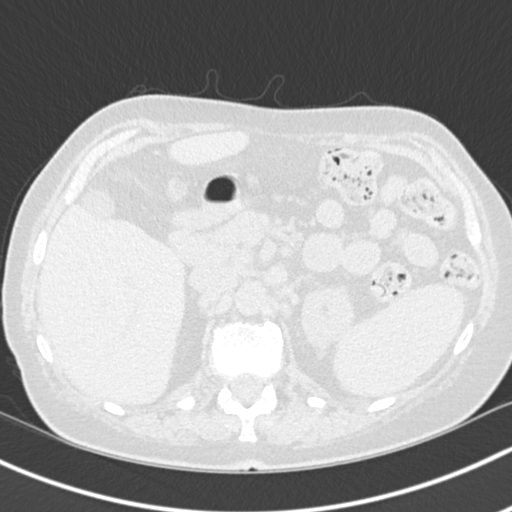
[im 23/152  lung]
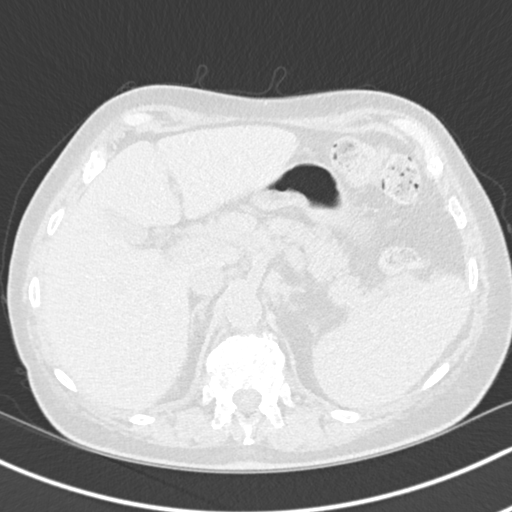
[im 34/152  lung]
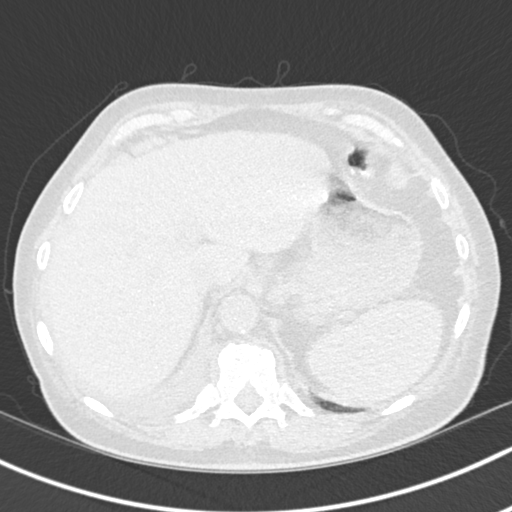
[im 45/152  lung]
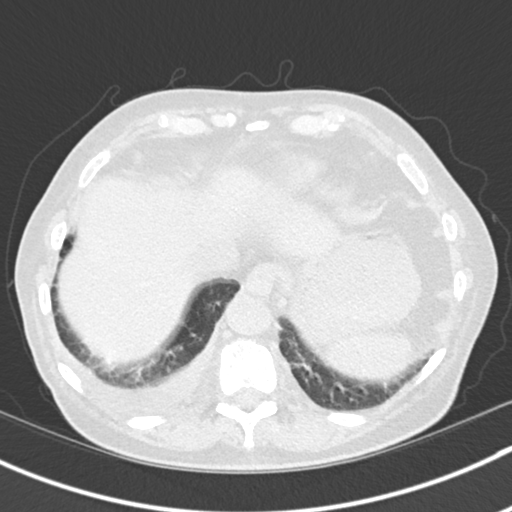
[im 56/152  mediastinal]
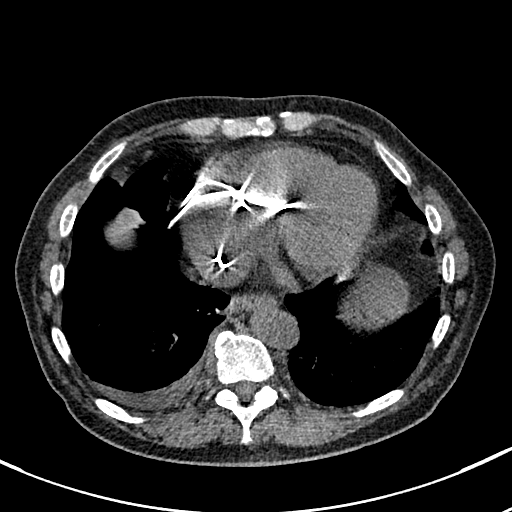
[im 56/152  lung]
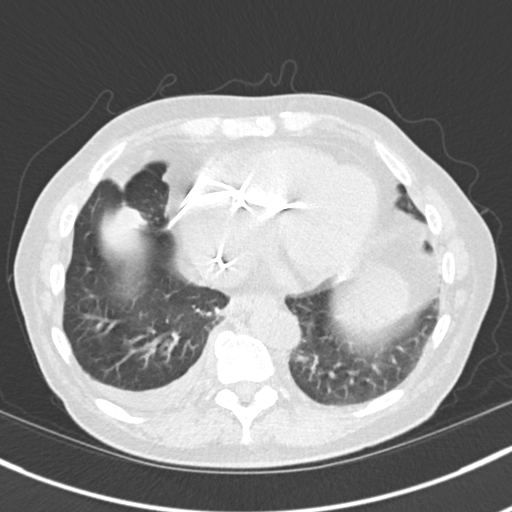
[im 68/152  lung]
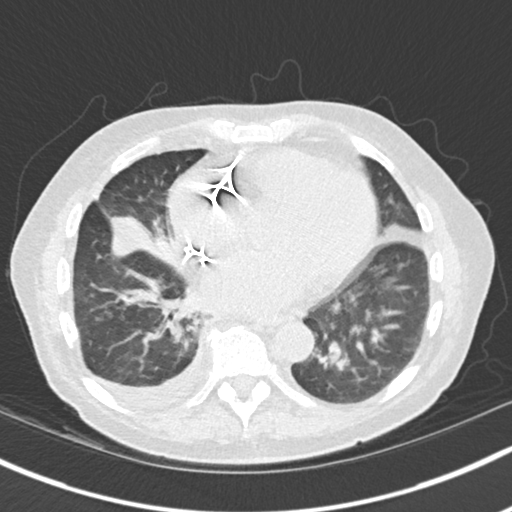
[im 84/152  lung]
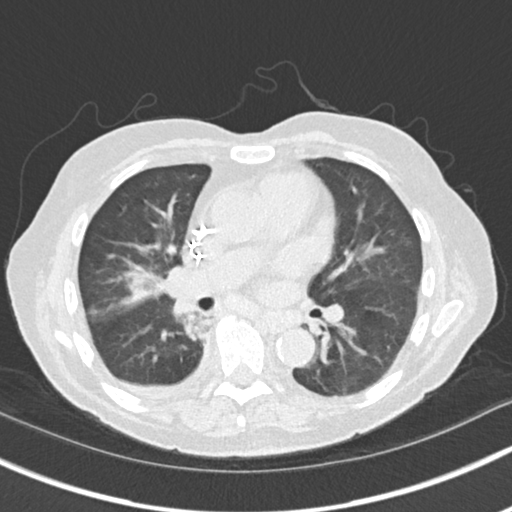
[im 96/152  lung]
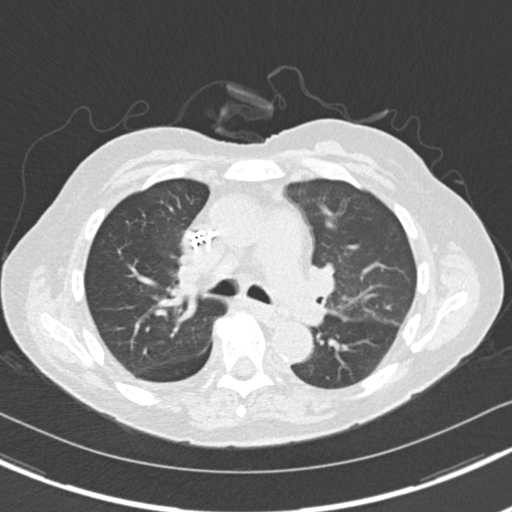
[im 107/152  mediastinal]
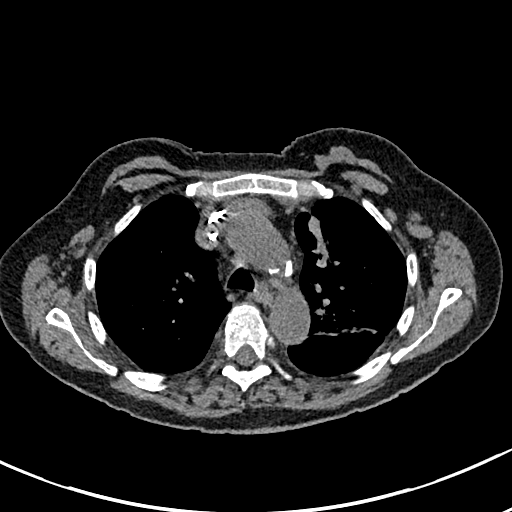
[im 107/152  lung]
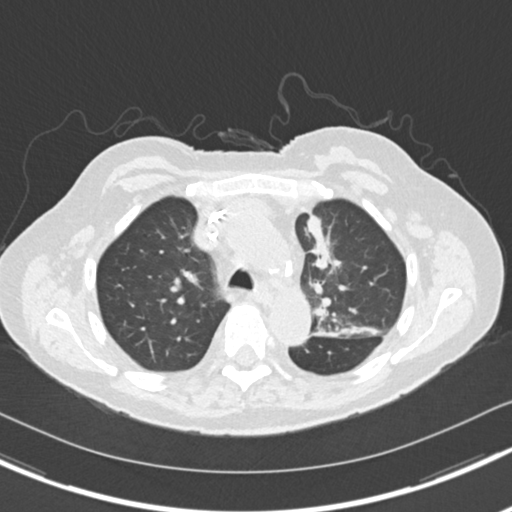
[im 118/152  lung]
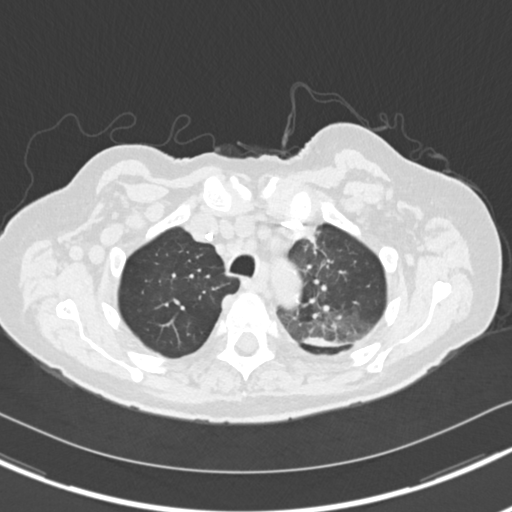
[im 129/152  lung]
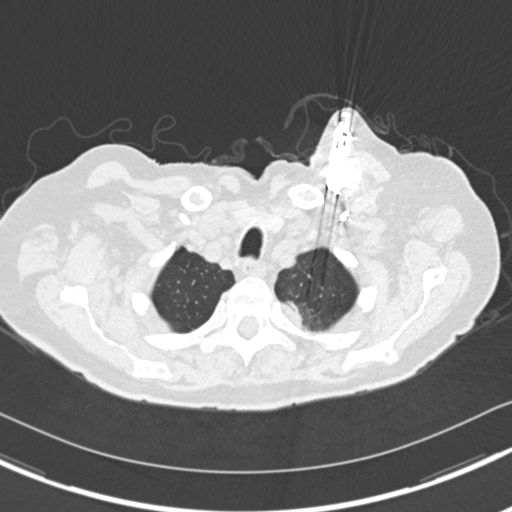
[im 140/152  lung]
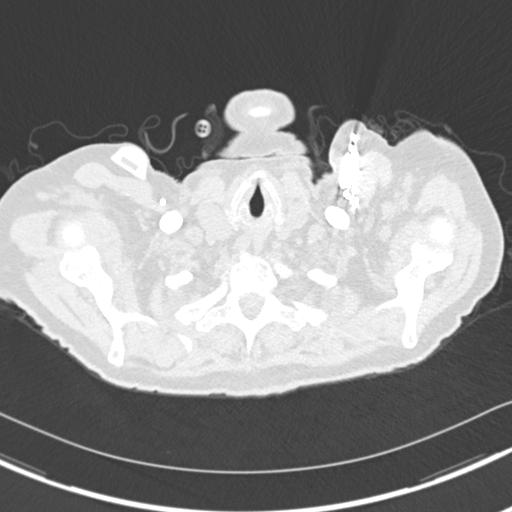

[Series 5: coronal · coronal · 0.62mm/px · 3 of 121 slices shown]
[im 25/121  lung]
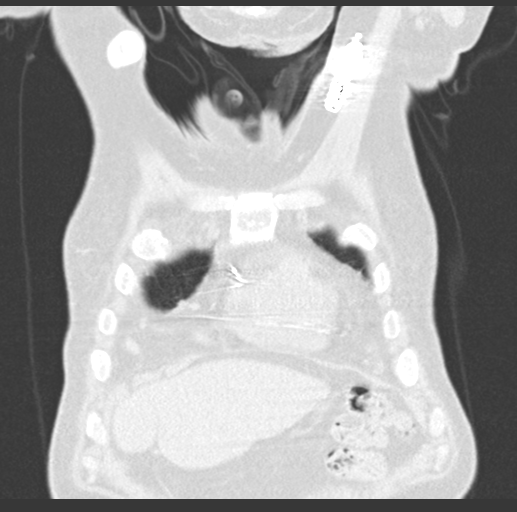
[im 49/121  lung]
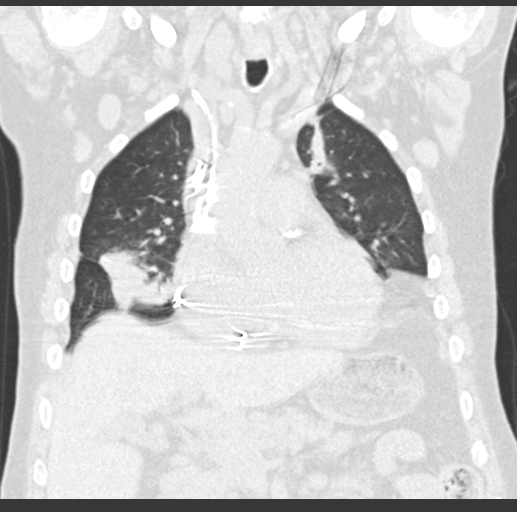
[im 73/121  lung]
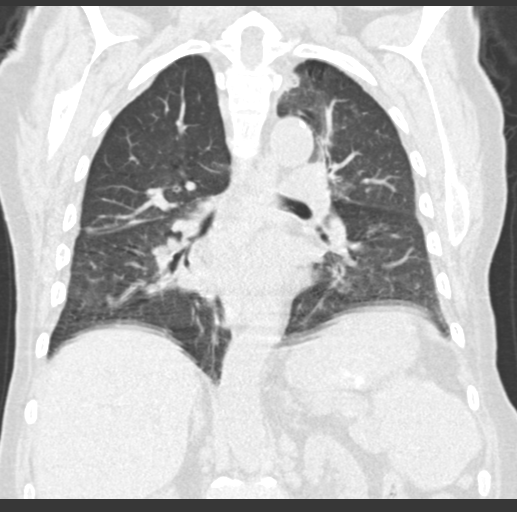

[15 of 36 positions shown; findings below may reference images not displayed]

FINDINGS: Cardiovascular: Atherosclerotic calcifications aorta, coronary
arteries and proximal great vessels. Pacemaker leads RIGHT atrium
and RIGHT ventricle via LEFT subclavian approach. Ascending thoracic
aorta normal caliber. No pericardial effusion. RIGHT subclavian
Port-A-Cath with tip in SVC near cavoatrial junction.

Mediastinum/Nodes: Esophagus unremarkable. Base of cervical region
normal appearance. Mediastinal adenopathy including a 14 mm short
axis node RIGHT paratracheal image 52, 12 mm subcarinal node image
63, and a 10 mm RIGHT paratracheal node image 39. Overall
mediastinal nodes appear decreased in size since previous exam,
likely related to history of CLL. Numerous additional normal sized
para-aortic and AP window nodes. BILATERAL axillary adenopathy,
increased on RIGHT and slightly improved on LEFT; RIGHT axillary
node 12 mm short axis image 33 previously 9 mm,. Normal sized RIGHT
cardiophrenic angle nodes unchanged.

Lungs/Pleura: Small RIGHT pleural effusion, new. Segmental
atelectasis versus consolidation RIGHT middle lobe, new. Infiltrate
medial segment of RIGHT lower lobe. Paramediastinal density in the
LEFT upper lobe may represent residual atelectasis or new
infiltrate. Thickening or fluid in the posterior LEFT upper lobe
adjacent to minor fissure. Overall improved aeration in LEFT upper
lobe versus prior exam. No pneumothorax.

Upper Abdomen: Enlarged upper abdominal nodes including a 15 mm
short axis node adjacent to LEFT adrenal gland image 132, periportal
node 13 mm short axis image 130, and additional celiac axis and
retroperitoneal this, overall stable to slightly improved. Remaining
visualized upper abdomen unremarkable.

Musculoskeletal: Superior endplate compression deformities of T11
and T12 vertebral bodies appears unchanged. Old healed distal
sternal fracture. No acute bone lesions.
IMPRESSION: New RIGHT middle lobe segmental atelectasis versus consolidation.

New linear opacity in medial LEFT upper lobe which could represent
infiltrate or atelectasis, though there is overall improved
infiltrate LEFT upper lobe versus previous exam.

Small RIGHT pleural effusion.

Persistent thoracic and upper abdominal adenopathy likely related to
history of CLL, majority of nodes stable to slightly decreased in
sizes though there are nodes in the RIGHT axilla which have
increased in size since the previous study.

Aortic Atherosclerosis ([id number]).
